# Patient Record
Sex: Female | Born: 1950
Health system: Southern US, Community
[De-identification: ages and names within clinical notes are randomized; demographics above are authoritative.]

## PROBLEM LIST (undated history)

## (undated) DIAGNOSIS — M12811 Other specific arthropathies, not elsewhere classified, right shoulder: Secondary | ICD-10-CM

## (undated) DIAGNOSIS — J449 Chronic obstructive pulmonary disease, unspecified: Secondary | ICD-10-CM

## (undated) DIAGNOSIS — M5134 Other intervertebral disc degeneration, thoracic region: Secondary | ICD-10-CM

## (undated) DIAGNOSIS — Z8489 Family history of other specified conditions: Secondary | ICD-10-CM

## (undated) DIAGNOSIS — K219 Gastro-esophageal reflux disease without esophagitis: Secondary | ICD-10-CM

## (undated) DIAGNOSIS — I10 Essential (primary) hypertension: Secondary | ICD-10-CM

## (undated) DIAGNOSIS — Z7901 Long term (current) use of anticoagulants: Secondary | ICD-10-CM

## (undated) DIAGNOSIS — M810 Age-related osteoporosis without current pathological fracture: Secondary | ICD-10-CM

## (undated) DIAGNOSIS — I5020 Unspecified systolic (congestive) heart failure: Secondary | ICD-10-CM

## (undated) DIAGNOSIS — J45909 Unspecified asthma, uncomplicated: Secondary | ICD-10-CM

## (undated) DIAGNOSIS — Z8614 Personal history of Methicillin resistant Staphylococcus aureus infection: Secondary | ICD-10-CM

## (undated) DIAGNOSIS — I779 Disorder of arteries and arterioles, unspecified: Secondary | ICD-10-CM

## (undated) DIAGNOSIS — I639 Cerebral infarction, unspecified: Secondary | ICD-10-CM

## (undated) DIAGNOSIS — Z9841 Cataract extraction status, right eye: Secondary | ICD-10-CM

## (undated) DIAGNOSIS — M1990 Unspecified osteoarthritis, unspecified site: Secondary | ICD-10-CM

## (undated) DIAGNOSIS — G4733 Obstructive sleep apnea (adult) (pediatric): Secondary | ICD-10-CM

## (undated) DIAGNOSIS — I2510 Atherosclerotic heart disease of native coronary artery without angina pectoris: Secondary | ICD-10-CM

## (undated) DIAGNOSIS — Z9981 Dependence on supplemental oxygen: Secondary | ICD-10-CM

## (undated) DIAGNOSIS — Z9289 Personal history of other medical treatment: Secondary | ICD-10-CM

## (undated) DIAGNOSIS — I272 Pulmonary hypertension, unspecified: Secondary | ICD-10-CM

## (undated) DIAGNOSIS — F419 Anxiety disorder, unspecified: Secondary | ICD-10-CM

## (undated) DIAGNOSIS — Z9842 Cataract extraction status, left eye: Secondary | ICD-10-CM

## (undated) DIAGNOSIS — M1612 Unilateral primary osteoarthritis, left hip: Secondary | ICD-10-CM

## (undated) DIAGNOSIS — G4730 Sleep apnea, unspecified: Secondary | ICD-10-CM

## (undated) DIAGNOSIS — Z7952 Long term (current) use of systemic steroids: Secondary | ICD-10-CM

## (undated) DIAGNOSIS — S32010A Wedge compression fracture of first lumbar vertebra, initial encounter for closed fracture: Secondary | ICD-10-CM

## (undated) DIAGNOSIS — I482 Chronic atrial fibrillation, unspecified: Secondary | ICD-10-CM

## (undated) DIAGNOSIS — I700 Atherosclerosis of aorta: Secondary | ICD-10-CM

## (undated) DIAGNOSIS — I428 Other cardiomyopathies: Secondary | ICD-10-CM

## (undated) DIAGNOSIS — I517 Cardiomegaly: Secondary | ICD-10-CM

## (undated) DIAGNOSIS — I519 Heart disease, unspecified: Secondary | ICD-10-CM

## (undated) DIAGNOSIS — I77810 Thoracic aortic ectasia: Secondary | ICD-10-CM

## (undated) DIAGNOSIS — M12812 Other specific arthropathies, not elsewhere classified, left shoulder: Secondary | ICD-10-CM

## (undated) DIAGNOSIS — E785 Hyperlipidemia, unspecified: Secondary | ICD-10-CM

## (undated) DIAGNOSIS — E669 Obesity, unspecified: Secondary | ICD-10-CM

## (undated) DIAGNOSIS — E119 Type 2 diabetes mellitus without complications: Secondary | ICD-10-CM

## (undated) HISTORY — DX: Unspecified systolic (congestive) heart failure: I50.20

## (undated) HISTORY — DX: Pulmonary hypertension, unspecified: I27.20

## (undated) HISTORY — DX: Other cardiomyopathies: I42.8

## (undated) HISTORY — DX: Chronic atrial fibrillation, unspecified: I48.20

## (undated) HISTORY — DX: Obesity, unspecified: E66.9

## (undated) HISTORY — DX: Obstructive sleep apnea (adult) (pediatric): G47.33

## (undated) HISTORY — DX: Hyperlipidemia, unspecified: E78.5

## (undated) HISTORY — DX: Atherosclerotic heart disease of native coronary artery without angina pectoris: I25.10

## (undated) HISTORY — DX: Gastro-esophageal reflux disease without esophagitis: K21.9

## (undated) HISTORY — PX: CORONARY ANGIOPLASTY: SHX604

## (undated) HISTORY — DX: Heart disease, unspecified: I51.9

## (undated) HISTORY — DX: Personal history of other medical treatment: Z92.89

## (undated) HISTORY — PX: CARDIAC CATHETERIZATION: SHX172

## (undated) HISTORY — PX: OTHER SURGICAL HISTORY: SHX169

## (undated) HISTORY — DX: Sleep apnea, unspecified: G47.30

---

## 2003-10-24 ENCOUNTER — Other Ambulatory Visit: Payer: Self-pay

## 2005-08-22 ENCOUNTER — Ambulatory Visit: Payer: Self-pay | Admitting: Internal Medicine

## 2005-08-27 ENCOUNTER — Ambulatory Visit: Payer: Self-pay | Admitting: Internal Medicine

## 2005-09-30 ENCOUNTER — Ambulatory Visit: Payer: Self-pay | Admitting: Obstetrics and Gynecology

## 2005-12-03 ENCOUNTER — Emergency Department: Payer: Self-pay | Admitting: Emergency Medicine

## 2006-04-28 ENCOUNTER — Ambulatory Visit: Payer: Self-pay | Admitting: Specialist

## 2006-06-10 ENCOUNTER — Ambulatory Visit: Payer: Self-pay | Admitting: Specialist

## 2006-10-15 ENCOUNTER — Ambulatory Visit: Payer: Self-pay | Admitting: Gerontology

## 2007-02-19 ENCOUNTER — Emergency Department: Payer: Self-pay | Admitting: Emergency Medicine

## 2007-05-04 ENCOUNTER — Ambulatory Visit: Payer: Self-pay | Admitting: General Surgery

## 2007-05-11 ENCOUNTER — Ambulatory Visit: Payer: Self-pay | Admitting: General Surgery

## 2007-10-19 ENCOUNTER — Ambulatory Visit: Payer: Self-pay | Admitting: Specialist

## 2008-07-26 ENCOUNTER — Ambulatory Visit: Payer: Self-pay | Admitting: Specialist

## 2008-11-02 ENCOUNTER — Ambulatory Visit: Payer: Self-pay

## 2011-04-26 ENCOUNTER — Other Ambulatory Visit: Payer: Self-pay | Admitting: Physician Assistant

## 2011-11-10 ENCOUNTER — Ambulatory Visit: Payer: Self-pay | Admitting: Internal Medicine

## 2012-02-27 ENCOUNTER — Ambulatory Visit: Payer: Self-pay | Admitting: Gastroenterology

## 2012-03-01 LAB — PATHOLOGY REPORT

## 2012-06-28 ENCOUNTER — Ambulatory Visit: Payer: Self-pay | Admitting: Specialist

## 2012-06-28 LAB — CREATININE, SERUM
Creatinine: 1 mg/dL (ref 0.60–1.30)
EGFR (Non-African Amer.): >60

## 2012-11-11 ENCOUNTER — Ambulatory Visit: Payer: Self-pay | Admitting: Internal Medicine

## 2013-09-13 ENCOUNTER — Emergency Department: Payer: Self-pay | Admitting: Emergency Medicine

## 2013-09-23 ENCOUNTER — Other Ambulatory Visit: Payer: Self-pay | Admitting: Podiatry

## 2013-11-28 LAB — COMPREHENSIVE METABOLIC PANEL
Albumin: 3.6 g/dL (ref 3.4–5.0)
BUN: 29 mg/dL — ABNORMAL HIGH (ref 7–18)
Chloride: 108 mmol/L — ABNORMAL HIGH (ref 98–107)
Co2: 27 mmol/L (ref 21–32)
Creatinine: 0.79 mg/dL (ref 0.60–1.30)
Glucose: 130 mg/dL — ABNORMAL HIGH (ref 65–99)
SGOT(AST): 24 U/L (ref 15–37)
Total Protein: 6.9 g/dL (ref 6.4–8.2)

## 2013-11-28 LAB — CBC
HCT: 39.2 % (ref 35.0–47.0)
MCH: 29.8 pg (ref 26.0–34.0)
MCHC: 33.2 g/dL (ref 32.0–36.0)
Platelet: 328 10*3/uL (ref 150–440)
RBC: 4.37 10*6/uL (ref 3.80–5.20)
RDW: 13.6 % (ref 11.5–14.5)
WBC: 9.5 10*3/uL (ref 3.6–11.0)

## 2013-11-28 LAB — TROPONIN I: Troponin-I: <0.02 ng/mL

## 2013-11-29 ENCOUNTER — Inpatient Hospital Stay: Payer: Self-pay | Admitting: Internal Medicine

## 2013-11-29 LAB — HEMOGLOBIN A1C: Hemoglobin A1C: 6 % (ref 4.2–6.3)

## 2013-12-02 LAB — PLATELET COUNT: Platelet: 353 10*3/uL (ref 150–440)

## 2014-02-01 ENCOUNTER — Ambulatory Visit: Payer: Self-pay | Admitting: Internal Medicine

## 2014-03-21 ENCOUNTER — Ambulatory Visit: Payer: Self-pay | Admitting: Physician Assistant

## 2014-05-04 ENCOUNTER — Ambulatory Visit: Payer: Self-pay | Admitting: Physician Assistant

## 2014-07-06 ENCOUNTER — Ambulatory Visit: Payer: Self-pay | Admitting: Internal Medicine

## 2014-08-06 ENCOUNTER — Emergency Department: Payer: Self-pay | Admitting: Emergency Medicine

## 2014-11-23 ENCOUNTER — Ambulatory Visit: Payer: Self-pay | Admitting: Internal Medicine

## 2015-04-13 NOTE — H&P (Signed)
PATIENT NAME:  Laura [name], Laura [name] MR#:  [id number] DATE OF BIRTH:  [birth year]  DATE OF ADMISSION:  11/29/2013  REFERRING PHYSICIAN: Dr. Owens Shark.   PRIMARY CARE PHYSICIAN: Dr. Lisette Grinder   PULMONOLOGIST: Dr. Raul Del.   CHIEF COMPLAINT: Shortness of breath, cough.   HISTORY OF PRESENT ILLNESS: This is a 64 year old Caucasian female with a past medical history of asthma, as well as deep vein thrombosis diagnosed back in the late 90s, who is presenting with shortness of breath and cough. She has had symptoms present for almost one year in total, which is why she follows with her pulmonologist, Dr. Raul Del. She has been on multiple doses of steroids and azithromycin, but states that whenever she is off steroids, her symptoms return. She has been having progressively worsening symptoms for approximately one week duration. She was recently started on steroids by her pulmonologist as an outpatient. Despite this, she is still having worsening symptoms. In the last one day, her shortness of breath is worse with movement, but present at rest as well, associated cough productive of gray-brown sputum. She denies any chest pain, palpitations, edema, orthopnea, fevers, chills, or recent sick contacts. She does, however, complain of having reflux sensation which is severe. 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   REVIEW OF SYSTEMS:   CONSTITUTIONAL: Denies fever or pain. Positive for generalized weakness and fatigue.  EYES: Denies blurred vision, double vision, eye pain.  EARS, NOSE, THROAT: Denies tinnitus, ear pain, hearing loss, postnasal drip.  RESPIRATORY: Positive for cough, wheeze, shortness of breath, as described above.  CARDIOVASCULAR: Denies chest pain, palpitations, edema.  GASTROINTESTINAL: Denies nausea, vomiting, diarrhea, abdominal pain. Positive for reflux disease.  GENITOURINARY: Denies dysuria, hematuria.  ENDOCRINE: Denies nocturia or thyroid problems.   HEMATOLOGIC AND LYMPHATIC: Denies easy bruising or bleeding.  SKIN: Denies rashes or lesions.  MUSCULOSKELETAL: Denies pain in neck, back, shoulder, knees, hips, arthritic symptoms.  NEUROLOGIC: Denies paralysis, paresthesias.  PSYCHIATRIC: Denies anxiety or depressive symptoms.  Otherwise, full review of systems system was performed by me is negative.   PAST MEDICAL HISTORY: Asthma, as well as deep vein thrombosis diagnosed back in the late 1990s, currently on anticoagulation.   SOCIAL HISTORY: Denies any alcohol, tobacco or drug usage. She works in the [employer] and is exposed to dust and chemicals through this.   FAMILY HISTORY: Positive for chronic obstructive pulmonary disease in her father as well as coronary artery disease in her father.   ALLERGIES: NO KNOWN DRUG ALLERGIES.   HOME MEDICATIONS: Include Advair Diskus 250/50 mcg 1 puff b.i.d., albuterol 2.5 mg per 3 mL inhalation q.6h. as needed for shortness of breath, Singulair 10 mg once daily in the evening.   PHYSICAL EXAMINATION: VITAL SIGNS: Temperature 97.7, heart rate 84, respirations 20, blood pressure 129/61, saturating 99% on supplemental O2. Weight 126.1 kg, BMI 47.8.  GENERAL: Obese Caucasian female in minimal distress secondary to respiratory status.  HEAD: Normocephalic, atraumatic.  EYES: Pupils equal, round, reactive to light. Extraocular muscles intact. No scleral icterus.  MOUTH: Moist mucosal membranes. Dentition intact. No abscess noted.  EARS, NOSE, AND THROAT: Throat clear, without exudates. No external lesions.  NECK: Supple. No thyromegaly. No nodules. No JVD.  PULMONARY: Greatly diminished breath sounds with scant expiratory wheezing throughout all lung fields. No use of accessory muscles. Good respiratory effort though tachypneic.  CHEST: Nontender to palpation.  CARDIOVASCULAR: S1, S1, S2, regular rate and rhythm. No murmurs, rubs, or gallops. No  edema. Pedal pulses 2+ bilaterally.   GASTROINTESTINAL: Soft, nontender, nondistended. No masses. Obese. Positive bowel sounds. No hepatosplenomegaly.  MUSCULOSKELETAL: No swelling, clubbing or edema. Range of motion full in all extremities.  NEUROLOGIC: Cranial nerves II through XII intact. No gross neurological deficits. Sensation intact. Reflexes intact.  SKIN: No ulcerations, lesions, rashes, or cyanosis. Skin warm, dry. Turgor is intact.  PSYCHIATRIC: Mood and affect within normal limits. The patient is awake, alert, and oriented x3. Insight and judgment intact.   LABORATORY DATA: Sodium 140, potassium 4.1, chloride 108, bicarbonate 27, BUN 29, creatinine 0.79, glucose 130, BNP 207. LFTs within normal limits. WBC 9.5, hemoglobin 13, platelets 328. Chest x-ray revealing shallow lung fields. EKG performed. Normal sinus rhythm.   ASSESSMENT AND PLAN: A 64 year old Caucasian female with history of asthma, as well as a deep vein thrombosis, presenting with shortness of breath and cough. She has been followed by Dr. Raul Del of pulmonology for approximately a year with duration of symptoms, off and on steroids and azithromycin.  1.  Acute respiratory failure with hypoxia. Became hypoxic in the Emergency Department, then given three DuoNeb treatments thus far, still not greatly improved. Will admit, provide supplemental O2 to keep oxygen saturation greater than 92%, provide DuoNeb therapy q.4 hours. We will consult pulmonology. We will give azithromycin as well as Solu-Medrol 60 mg IV. There was questionable etiology of this, whether this is related to a possible pneumonitis, given severe acid reflux versus textile pneumoconiosis. Given her social history as an outpatient, she will need pulmonary function tests if not already done, as well as will likely benefit from high-resolution CT. 2.  Gastroesophageal reflux disease. Will start PPI.  3.  Hyperglycemia, likely steroid induced. Add insulin sliding scale and q.6 hour Accu-Cheks. Will check  hemoglobin A1c.  4.  Deep vein thrombosis prophylaxis with heparin subcutaneous.   CODE STATUS: The patient is full code.   TIME SPENT: 45 minutes.    ____________________________ Aaron Mose. Foster Frericks, MD dkh:cg D: 11/29/2013 01:17:54 ET T: 11/29/2013 01:44:07 ET JOB#: [id number]  cc: Aaron Mose. Thomasena Vandenheuvel, MD, <Dictator> Zori Benbrook Woodfin Ganja MD ELECTRONICALLY SIGNED 11/30/2013 0:31

## 2015-04-14 NOTE — Discharge Summary (Signed)
PATIENT NAME:  Laura [name], Laura [name] MR#:  [id number] DATE OF BIRTH:  07-02-1951  DATE OF ADMISSION:  11/29/2013 DATE OF DISCHARGE:  12/02/2013  HISTORY OF PRESENT ILLNESS:  Laura [name] is a 64 year old white Laura [name] with a history of asthma and COPD who had been followed by Dr. Raul Del in pulmonary. She had had multiple flares of COPD as an outpatient and presented with a 1-week history of increasing shortness of breath, cough and sputum production. She was seen by the hospitalist service and admitted for further evaluation.   PAST MEDICAL HISTORY:  Was not given in the H and P by the admitting physician.   ALLERGIES:  The patient was noted to have no known drug allergies.   MEDICATIONS ON ADMISSION:  Included Advair 250/50 inhaler 1 puff b.i.d., albuterol 1.25 mg SVN q.6 hours as needed and Singulair 10 mg daily.   ADMISSION PHYSICAL EXAMINATION: As described by the admitting physician revealed a temperature of 97.7, pulse 84, respirations 20 and blood pressure 129/61. O2 sat was 99% on supplemental oxygen. Admission examination as described by the admitting physician was most notable for diminished breath sounds with expiratory wheezing throughout all lung fields.  She was noted to be tachypneic.   LABORATORY, DIAGNOSTIC AND RADIOLOGICAL STUDIES:  Admission chest x-ray showed shallow lung fields but no acute infiltrates. EKG shows a sinus rhythm but was otherwise unremarkable. CBC and basic metabolic panel were also unremarkable.   HOSPITAL COURSE: The patient was admitted to the regular medical floor where she was started on an intensive pulmonary toilet of IV steroids, IV antibiotics and SVNs. She was seen in consultation by her gastroenterologist. She was started on PPIs for symptoms of reflux. The patient did show slow but gradual improvement. She was noted at the time of discharge to have a normal pulse ox on room air. She was given a reflux diet handout prior to discharge.   DISCHARGE  DIAGNOSES: 1.  Acute respiratory failure secondary to chronic obstructive pulmonary disease flare.  2.  Chronic obstructive pulmonary disease.  3.  Obstructive sleep apnea.   DISCHARGE MEDICATIONS: 1.  Advair 250/50 inhaler 1 puff b.i.d.  2.  Albuterol 2.5 mg SVN every 6 hours as needed.  3.  Singulair 10 mg once a day.  4.  Prednisone 40 mg daily.  5.  Theophylline 200 mg extended-release b.i.d.  6.  Flonase 2 puffs each nostril daily.  7.  Protonix 40 mg daily.  8.  Hycodan 5 mL every 6 hours as needed for cough.   DISCHARGE DISPOSITION: The patient was discharged on a regular diet with activity as tolerated.   FOLLOWUP: She is to see Dr. Raul Del in approximately a week for followup and to have her steroids tapered.   ____________________________ Hewitt Blade Sarina Ser, MD jbw:cs D: 12/25/2013 11:10:15 ET T: 12/25/2013 14:08:14 ET JOB#: [id number]  cc: Jenny Reichmann B. Sarina Ser, MD, <Dictator> Lottie Mussel III MD ELECTRONICALLY SIGNED 12/26/2013 7:25

## 2015-05-31 ENCOUNTER — Other Ambulatory Visit: Payer: Self-pay | Admitting: Internal Medicine

## 2015-05-31 ENCOUNTER — Ambulatory Visit
Admission: RE | Admit: 2015-05-31 | Discharge: 2015-05-31 | Disposition: A | Discharge status: Home / Self Care | Payer: Managed Care, Other (non HMO) | Source: Ambulatory Visit | Attending: Internal Medicine | Admitting: Internal Medicine

## 2015-05-31 DIAGNOSIS — J040 Acute laryngitis: Secondary | ICD-10-CM | POA: Diagnosis not present

## 2015-05-31 DIAGNOSIS — R05 Cough: Secondary | ICD-10-CM | POA: Insufficient documentation

## 2015-05-31 DIAGNOSIS — R059 Cough, unspecified: Secondary | ICD-10-CM

## 2015-05-31 DIAGNOSIS — J029 Acute pharyngitis, unspecified: Secondary | ICD-10-CM | POA: Diagnosis not present

## 2015-08-16 ENCOUNTER — Ambulatory Visit
Admission: RE | Admit: 2015-08-16 | Discharge: 2015-08-16 | Disposition: A | Discharge status: Home / Self Care | Payer: Managed Care, Other (non HMO) | Source: Ambulatory Visit | Attending: Internal Medicine | Admitting: Internal Medicine

## 2015-08-16 ENCOUNTER — Other Ambulatory Visit: Payer: Self-pay | Admitting: Internal Medicine

## 2015-08-16 DIAGNOSIS — R05 Cough: Secondary | ICD-10-CM

## 2015-08-16 DIAGNOSIS — R059 Cough, unspecified: Secondary | ICD-10-CM

## 2015-08-23 ENCOUNTER — Other Ambulatory Visit: Payer: Self-pay | Admitting: Internal Medicine

## 2015-08-23 DIAGNOSIS — J986 Disorders of diaphragm: Secondary | ICD-10-CM

## 2015-09-05 ENCOUNTER — Ambulatory Visit: Payer: Managed Care, Other (non HMO)

## 2015-12-06 ENCOUNTER — Other Ambulatory Visit: Payer: Self-pay | Admitting: Physician Assistant

## 2015-12-06 DIAGNOSIS — R062 Wheezing: Secondary | ICD-10-CM

## 2015-12-06 DIAGNOSIS — R0602 Shortness of breath: Secondary | ICD-10-CM

## 2015-12-07 ENCOUNTER — Ambulatory Visit
Admission: RE | Admit: 2015-12-07 | Discharge: 2015-12-07 | Disposition: A | Discharge status: Home / Self Care | Payer: Managed Care, Other (non HMO) | Source: Ambulatory Visit | Attending: Physician Assistant | Admitting: Physician Assistant

## 2015-12-07 DIAGNOSIS — J449 Chronic obstructive pulmonary disease, unspecified: Secondary | ICD-10-CM | POA: Insufficient documentation

## 2015-12-07 DIAGNOSIS — R062 Wheezing: Secondary | ICD-10-CM | POA: Insufficient documentation

## 2015-12-07 DIAGNOSIS — J45909 Unspecified asthma, uncomplicated: Secondary | ICD-10-CM | POA: Insufficient documentation

## 2015-12-07 DIAGNOSIS — R0602 Shortness of breath: Secondary | ICD-10-CM

## 2016-03-26 DIAGNOSIS — G4733 Obstructive sleep apnea (adult) (pediatric): Secondary | ICD-10-CM | POA: Diagnosis not present

## 2016-05-13 DIAGNOSIS — K52831 Collagenous colitis: Secondary | ICD-10-CM | POA: Diagnosis not present

## 2016-05-13 DIAGNOSIS — K219 Gastro-esophageal reflux disease without esophagitis: Secondary | ICD-10-CM | POA: Diagnosis not present

## 2016-05-13 DIAGNOSIS — R197 Diarrhea, unspecified: Secondary | ICD-10-CM | POA: Diagnosis not present

## 2016-05-14 ENCOUNTER — Other Ambulatory Visit
Admission: RE | Admit: 2016-05-14 | Discharge: 2016-05-14 | Disposition: A | Discharge status: Home / Self Care | Payer: 59 | Source: Ambulatory Visit | Attending: Gastroenterology | Admitting: Gastroenterology

## 2016-05-14 DIAGNOSIS — K52831 Collagenous colitis: Secondary | ICD-10-CM | POA: Insufficient documentation

## 2016-05-14 DIAGNOSIS — R197 Diarrhea, unspecified: Secondary | ICD-10-CM | POA: Insufficient documentation

## 2016-05-14 LAB — C DIFFICILE QUICK SCREEN W PCR REFLEX
C DIFFICLE (CDIFF) ANTIGEN: NEGATIVE
C Diff interpretation: NEGATIVE
C Diff toxin: NEGATIVE

## 2016-05-14 LAB — GASTROINTESTINAL PANEL BY PCR, STOOL (REPLACES STOOL CULTURE)
ADENOVIRUS F40/41: NOT DETECTED
Astrovirus: NOT DETECTED
CRYPTOSPORIDIUM: NOT DETECTED
CYCLOSPORA CAYETANENSIS: NOT DETECTED
Campylobacter species: NOT DETECTED
E. coli O157: NOT DETECTED
ENTEROPATHOGENIC E COLI (EPEC): NOT DETECTED
Entamoeba histolytica: NOT DETECTED
Enteroaggregative E coli (EAEC): NOT DETECTED
Enterotoxigenic E coli (ETEC): NOT DETECTED
Giardia lamblia: NOT DETECTED
Norovirus GI/GII: NOT DETECTED
PLESIMONAS SHIGELLOIDES: NOT DETECTED
Rotavirus A: NOT DETECTED
SALMONELLA SPECIES: NOT DETECTED
SAPOVIRUS (I, II, IV, AND V): NOT DETECTED
Shiga like toxin producing E coli (STEC): NOT DETECTED
Shigella/Enteroinvasive E coli (EIEC): NOT DETECTED
VIBRIO SPECIES: NOT DETECTED
Vibrio cholerae: NOT DETECTED
YERSINIA ENTEROCOLITICA: NOT DETECTED

## 2016-06-05 DIAGNOSIS — J3089 Other allergic rhinitis: Secondary | ICD-10-CM | POA: Diagnosis not present

## 2016-06-05 DIAGNOSIS — J301 Allergic rhinitis due to pollen: Secondary | ICD-10-CM | POA: Diagnosis not present

## 2016-06-27 ENCOUNTER — Emergency Department
Admission: EM | Admit: 2016-06-27 | Discharge: 2016-06-27 | Disposition: A | Discharge status: Home / Self Care | Payer: 59 | Source: Home / Self Care | Attending: Emergency Medicine | Admitting: Emergency Medicine

## 2016-06-27 ENCOUNTER — Encounter: Payer: Self-pay | Admitting: Emergency Medicine

## 2016-06-27 DIAGNOSIS — L03111 Cellulitis of right axilla: Secondary | ICD-10-CM | POA: Diagnosis not present

## 2016-06-27 DIAGNOSIS — L0231 Cutaneous abscess of buttock: Secondary | ICD-10-CM | POA: Diagnosis not present

## 2016-06-27 DIAGNOSIS — L02411 Cutaneous abscess of right axilla: Secondary | ICD-10-CM

## 2016-06-27 DIAGNOSIS — I10 Essential (primary) hypertension: Secondary | ICD-10-CM

## 2016-06-27 DIAGNOSIS — J45909 Unspecified asthma, uncomplicated: Secondary | ICD-10-CM

## 2016-06-27 DIAGNOSIS — L03317 Cellulitis of buttock: Secondary | ICD-10-CM | POA: Diagnosis not present

## 2016-06-27 DIAGNOSIS — J449 Chronic obstructive pulmonary disease, unspecified: Secondary | ICD-10-CM | POA: Insufficient documentation

## 2016-06-27 DIAGNOSIS — E1165 Type 2 diabetes mellitus with hyperglycemia: Secondary | ICD-10-CM | POA: Diagnosis not present

## 2016-06-27 DIAGNOSIS — Z79899 Other long term (current) drug therapy: Secondary | ICD-10-CM

## 2016-06-27 DIAGNOSIS — A419 Sepsis, unspecified organism: Secondary | ICD-10-CM | POA: Diagnosis not present

## 2016-06-27 LAB — BASIC METABOLIC PANEL
ANION GAP: 11 (ref 5–15)
BUN: 21 mg/dL — AB (ref 6–20)
CHLORIDE: 97 mmol/L — AB (ref 101–111)
CO2: 28 mmol/L (ref 22–32)
Calcium: 9 mg/dL (ref 8.9–10.3)
Creatinine, Ser: 0.87 mg/dL (ref 0.44–1.00)
GFR calc Af Amer: >60 mL/min (ref >60)
Glucose, Bld: 99 mg/dL (ref 65–99)
POTASSIUM: 3.2 mmol/L — AB (ref 3.5–5.1)
SODIUM: 136 mmol/L (ref 135–145)

## 2016-06-27 LAB — CBC WITH DIFFERENTIAL/PLATELET
BASOS ABS: 0.1 10*3/uL (ref 0–0.1)
Basophils Relative: 1 %
Eosinophils Absolute: 0.4 10*3/uL (ref 0–0.7)
Eosinophils Relative: 3 %
HCT: 42.2 % (ref 35.0–47.0)
Hemoglobin: 14.4 g/dL (ref 12.0–16.0)
Lymphocytes Relative: 12 %
Lymphs Abs: 2.1 10*3/uL (ref 1.0–3.6)
MCH: 30.5 pg (ref 26.0–34.0)
MCHC: 34.1 g/dL (ref 32.0–36.0)
MCV: 89.4 fL (ref 80.0–100.0)
Monocytes Absolute: 1.4 10*3/uL — ABNORMAL HIGH (ref 0.2–0.9)
Monocytes Relative: 8 %
NEUTROS ABS: 13.6 10*3/uL — AB (ref 1.4–6.5)
NEUTROS PCT: 76 %
PLATELETS: 236 10*3/uL (ref 150–440)
RBC: 4.71 MIL/uL (ref 3.80–5.20)
RDW: 15.2 % — ABNORMAL HIGH (ref 11.5–14.5)
WBC: 17.6 10*3/uL — AB (ref 3.6–11.0)

## 2016-06-27 MED ORDER — CEFTRIAXONE SODIUM 1 G IJ SOLR
1.0000 g | Freq: Once | INTRAMUSCULAR | Status: DC
  Filled 2016-06-27: qty 10

## 2016-06-27 MED ORDER — ONDANSETRON 4 MG PO TBDP
4.0000 mg | ORAL_TABLET | Freq: Once | ORAL | Status: AC
  Administered 2016-06-27: 4 mg via ORAL
  Filled 2016-06-27: qty 1

## 2016-06-27 MED ORDER — SODIUM CHLORIDE 0.9 % IV BOLUS (SEPSIS)
1000.0000 mL | Freq: Once | INTRAVENOUS | Status: AC
  Administered 2016-06-27: 1000 mL via INTRAVENOUS

## 2016-06-27 MED ORDER — SULFAMETHOXAZOLE-TRIMETHOPRIM 800-160 MG PO TABS: 1.0000 | ORAL_TABLET | Freq: Two times a day (BID) | ORAL | Status: DC

## 2016-06-27 MED ORDER — HYDROMORPHONE HCL 1 MG/ML IJ SOLN
0.5000 mg | Freq: Once | INTRAMUSCULAR | Status: AC
  Administered 2016-06-27: 0.5 mg via INTRAVENOUS
  Filled 2016-06-27: qty 1

## 2016-06-27 MED ORDER — LIDOCAINE HCL (PF) 1 % IJ SOLN
INTRAMUSCULAR | Status: AC
  Filled 2016-06-27: qty 5

## 2016-06-27 MED ORDER — DEXTROSE 5 % IV SOLN
1.0000 g | Freq: Once | INTRAVENOUS | Status: AC
  Administered 2016-06-27: 1 g via INTRAVENOUS
  Filled 2016-06-27: qty 10

## 2016-06-27 MED ORDER — OXYCODONE-ACETAMINOPHEN 5-325 MG PO TABS: 1.0000 | ORAL_TABLET | ORAL | Status: DC | PRN

## 2016-06-27 NOTE — ED Provider Notes (Signed)
[facility] Emergency Department Provider Note  ____________________________________________  Time seen: Approximately 4:17 PM  I have reviewed the triage vital signs and the nursing notes.   HISTORY  Chief Complaint Abscess    HPI Laura [name] is a 65 y.o. female who presents for evaluation of abscess to her right axilla. Patient states severe pain 10 over 10 and reports going to the water park 5 days ago and subsequently given an infection in her arm. Patient states that she feels like she is dehydrated and feels weak. Originally had problems breathing secondary to her history of COPD used to nebulizer, feels a little bit better. Patient feels like the wire in her brawl poked out and struck her in the armpit and that's when the infection started. Denies any fever positive for chills but feels like a secondary to being in sun.   History reviewed. No pertinent past medical history.  There are no active problems to display for this patient.   History reviewed. No pertinent past surgical history.  Current Outpatient Rx  Name  Route  Sig  Dispense  Refill  . oxyCODONE-acetaminophen (ROXICET) 5-325 MG tablet   Oral   Take 1-2 tablets by mouth every 4 (four) hours as needed for severe pain.   15 tablet   0   . sulfamethoxazole-trimethoprim (BACTRIM DS,SEPTRA DS) 800-160 MG tablet   Oral   Take 1 tablet by mouth 2 (two) times daily.   20 tablet   0     Allergies Review of patient's allergies indicates no known allergies.  No family history on file.  Social History Social History  Substance Use Topics  . Smoking status: Never Smoker   . Smokeless tobacco: None  . Alcohol Use: None    Review of Systems Constitutional: No fever/Positive chills Eyes: No visual changes. ENT: No sore throat. Cardiovascular: Denies chest pain. Respiratory: Denies shortness of breath. Gastrointestinal: No abdominal pain.  No nausea, no vomiting.  No diarrhea.   No constipation. Genitourinary: Negative for dysuria. Musculoskeletal: Negative for back pain. Skin: Negative for rash. Neurological: Negative for headaches, focal weakness or numbness.  10-point ROS otherwise negative.  ____________________________________________   PHYSICAL EXAM:  VITAL SIGNS: ED Triage Vitals  Enc Vitals Group     BP 06/27/16 1558 122/67 mmHg     Pulse Rate 06/27/16 1558 96     Resp 06/27/16 1558 20     Temp 06/27/16 1558 98.5 F (36.9 C)     Temp Source 06/27/16 1558 Oral     SpO2 06/27/16 1558 99 %     Weight 06/27/16 1558 295 lb (133.811 kg)     Height 06/27/16 1558 5' 4.5" (1.638 m)     Head Cir --      Peak Flow --      Pain Score 06/27/16 1557 9     Pain Loc --      Pain Edu? --      Excl. in [place]? --     Constitutional: Alert and oriented. Well appearing and in no acute distress. Eyes: Conjunctivae are normal. PERRL. EOMI. Head: Atraumatic. Nose: No congestion/rhinnorhea. Mouth/Throat: Mucous membranes are moist.  Oropharynx non-erythematous. Neck: No stridor.   Cardiovascular: Normal rate, regular rhythm. Grossly normal heart sounds.  Good peripheral circulation. Respiratory: Normal respiratory effort.  No retractions. Lungs CTAB. Musculoskeletal: No lower extremity tenderness nor edema.  No joint effusions. Neurologic:  Normal speech and language. No gross focal neurologic deficits are appreciated. No gait instability.  Skin:  Right axilla with an 1 cm abscess noted nondraining. Nonfluctuant. An area 14 x 14 cm of erythema and warmth and tenderness noted within the axle itself. Psychiatric: Mood and affect are normal. Speech and behavior are normal.  ____________________________________________   LABS (all labs ordered are listed, but only abnormal results are displayed)  Labs Reviewed  BASIC METABOLIC PANEL - Abnormal; Notable for the following:    Potassium 3.2 (*)    Chloride 97 (*)    BUN 21 (*)    All other components within  normal limits  CBC WITH DIFFERENTIAL/PLATELET - Abnormal; Notable for the following:    WBC 17.6 (*)    RDW 15.2 (*)    Neutro Abs 13.6 (*)    Monocytes Absolute 1.4 (*)    All other components within normal limits   ____________________________________________  EKG   ____________________________________________  RADIOLOGY   ____________________________________________   PROCEDURES  Procedure(s) performed: None  Critical Care performed: No  ____________________________________________   INITIAL IMPRESSION / ASSESSMENT AND PLAN / ED COURSE  Pertinent labs & imaging results that were available during my care of the patient were reviewed by me and considered in my medical decision making (see chart for details).  Cutaneous abscess to the right axilla with cellulitis. Patient was given IV Rocephin 1 g as well as Dilaudid 1 mg and Zofran 4 mg. Patient desires not to have it lanced at this time Patient discharged home with Bactrim DS twice a day and instructed to return to the ER for wound check in 24 hours. Encouraged to use to moist heat. Area was marked with a marker to determine the edge of the cellulitis. She voices understanding and offers no other emergency medical complaints at this time. ____________________________________________   FINAL CLINICAL IMPRESSION(S) / ED DIAGNOSES  Final diagnoses:  Abscess of axilla, right     This chart was dictated using voice recognition software/Dragon. Despite best efforts to proofread, errors can occur which can change the meaning. Any change was purely unintentional.   Arlyss Repress, PA-C 06/27/16 [place], MD 06/27/16 2215

## 2016-06-27 NOTE — Discharge Instructions (Signed)

## 2016-06-27 NOTE — ED Notes (Signed)
Reports abscess to right axilla

## 2016-06-27 NOTE — ED Notes (Signed)
PT reports she was at a waterpark and when she got back she developed an abscess under her arm, also reports another one beginning in her "pantyline'

## 2016-06-28 ENCOUNTER — Inpatient Hospital Stay
Admission: EM | Admit: 2016-06-28 | Discharge: 2016-07-01 | DRG: 872 | Disposition: A | Discharge status: Home / Self Care | Payer: 59 | Attending: Internal Medicine | Admitting: Internal Medicine

## 2016-06-28 ENCOUNTER — Encounter: Payer: Self-pay | Admitting: Emergency Medicine

## 2016-06-28 DIAGNOSIS — E876 Hypokalemia: Secondary | ICD-10-CM | POA: Diagnosis present

## 2016-06-28 DIAGNOSIS — E1165 Type 2 diabetes mellitus with hyperglycemia: Secondary | ICD-10-CM | POA: Diagnosis present

## 2016-06-28 DIAGNOSIS — R0902 Hypoxemia: Secondary | ICD-10-CM | POA: Diagnosis present

## 2016-06-28 DIAGNOSIS — I10 Essential (primary) hypertension: Secondary | ICD-10-CM | POA: Diagnosis not present

## 2016-06-28 DIAGNOSIS — L0291 Cutaneous abscess, unspecified: Secondary | ICD-10-CM

## 2016-06-28 DIAGNOSIS — N289 Disorder of kidney and ureter, unspecified: Secondary | ICD-10-CM | POA: Diagnosis present

## 2016-06-28 DIAGNOSIS — L03111 Cellulitis of right axilla: Secondary | ICD-10-CM | POA: Diagnosis not present

## 2016-06-28 DIAGNOSIS — L02411 Cutaneous abscess of right axilla: Secondary | ICD-10-CM | POA: Diagnosis present

## 2016-06-28 DIAGNOSIS — L03317 Cellulitis of buttock: Secondary | ICD-10-CM | POA: Diagnosis present

## 2016-06-28 DIAGNOSIS — E860 Dehydration: Secondary | ICD-10-CM | POA: Diagnosis present

## 2016-06-28 DIAGNOSIS — M75121 Complete rotator cuff tear or rupture of right shoulder, not specified as traumatic: Secondary | ICD-10-CM | POA: Diagnosis not present

## 2016-06-28 DIAGNOSIS — Z79899 Other long term (current) drug therapy: Secondary | ICD-10-CM | POA: Diagnosis not present

## 2016-06-28 DIAGNOSIS — L0231 Cutaneous abscess of buttock: Secondary | ICD-10-CM | POA: Diagnosis present

## 2016-06-28 DIAGNOSIS — J449 Chronic obstructive pulmonary disease, unspecified: Secondary | ICD-10-CM | POA: Diagnosis present

## 2016-06-28 DIAGNOSIS — D72829 Elevated white blood cell count, unspecified: Secondary | ICD-10-CM

## 2016-06-28 DIAGNOSIS — A419 Sepsis, unspecified organism: Secondary | ICD-10-CM | POA: Diagnosis not present

## 2016-06-28 DIAGNOSIS — R000 Tachycardia, unspecified: Secondary | ICD-10-CM | POA: Diagnosis present

## 2016-06-28 DIAGNOSIS — J45909 Unspecified asthma, uncomplicated: Secondary | ICD-10-CM | POA: Diagnosis not present

## 2016-06-28 DIAGNOSIS — R52 Pain, unspecified: Secondary | ICD-10-CM

## 2016-06-28 DIAGNOSIS — R0602 Shortness of breath: Secondary | ICD-10-CM | POA: Diagnosis not present

## 2016-06-28 DIAGNOSIS — L0390 Cellulitis, unspecified: Secondary | ICD-10-CM

## 2016-06-28 DIAGNOSIS — R739 Hyperglycemia, unspecified: Secondary | ICD-10-CM

## 2016-06-28 HISTORY — DX: Unspecified asthma, uncomplicated: J45.909

## 2016-06-28 HISTORY — DX: Essential (primary) hypertension: I10

## 2016-06-28 LAB — CBC
HCT: 42.8 % (ref 35.0–47.0)
HEMOGLOBIN: 14.6 g/dL (ref 12.0–16.0)
MCH: 30.1 pg (ref 26.0–34.0)
MCHC: 34.1 g/dL (ref 32.0–36.0)
MCV: 88.2 fL (ref 80.0–100.0)
PLATELETS: 239 10*3/uL (ref 150–440)
RBC: 4.86 MIL/uL (ref 3.80–5.20)
RDW: 14.8 % — ABNORMAL HIGH (ref 11.5–14.5)
WBC: 21.6 10*3/uL — AB (ref 3.6–11.0)

## 2016-06-28 LAB — COMPREHENSIVE METABOLIC PANEL
ALK PHOS: 76 U/L (ref 38–126)
ALT: 14 U/L (ref 14–54)
ANION GAP: 12 (ref 5–15)
AST: 14 U/L — AB (ref 15–41)
Albumin: 3.4 g/dL — ABNORMAL LOW (ref 3.5–5.0)
BILIRUBIN TOTAL: 1.2 mg/dL (ref 0.3–1.2)
BUN: 19 mg/dL (ref 6–20)
CALCIUM: 8.8 mg/dL — AB (ref 8.9–10.3)
CO2: 26 mmol/L (ref 22–32)
Chloride: 98 mmol/L — ABNORMAL LOW (ref 101–111)
Creatinine, Ser: 1.13 mg/dL — ABNORMAL HIGH (ref 0.44–1.00)
GFR, EST AFRICAN AMERICAN: 57 mL/min — AB (ref >60)
GFR, EST NON AFRICAN AMERICAN: 50 mL/min — AB (ref >60)
Glucose, Bld: 106 mg/dL — ABNORMAL HIGH (ref 65–99)
Potassium: 3.4 mmol/L — ABNORMAL LOW (ref 3.5–5.1)
Sodium: 136 mmol/L (ref 135–145)
TOTAL PROTEIN: 6.9 g/dL (ref 6.5–8.1)

## 2016-06-28 LAB — TSH: TSH: 2.446 u[IU]/mL (ref 0.350–4.500)

## 2016-06-28 LAB — MAGNESIUM: Magnesium: 2 mg/dL (ref 1.7–2.4)

## 2016-06-28 MED ORDER — HYDROMORPHONE HCL 1 MG/ML IJ SOLN
1.0000 mg | Freq: Once | INTRAMUSCULAR | Status: AC
  Administered 2016-06-28: 1 mg via INTRAVENOUS
  Filled 2016-06-28: qty 1

## 2016-06-28 MED ORDER — MORPHINE SULFATE (PF) 2 MG/ML IV SOLN
2.0000 mg | INTRAVENOUS | Status: DC | PRN
  Administered 2016-06-29: 2 mg via INTRAVENOUS
  Filled 2016-06-28: qty 1

## 2016-06-28 MED ORDER — ACETAMINOPHEN 325 MG PO TABS
650.0000 mg | ORAL_TABLET | Freq: Four times a day (QID) | ORAL | Status: DC | PRN
  Filled 2016-06-28: qty 2

## 2016-06-28 MED ORDER — PROMETHAZINE HCL 25 MG/ML IJ SOLN
12.5000 mg | Freq: Once | INTRAMUSCULAR | Status: AC
  Administered 2016-06-28: 12.5 mg via INTRAVENOUS
  Filled 2016-06-28: qty 1

## 2016-06-28 MED ORDER — POTASSIUM CHLORIDE IN NACL 20-0.9 MEQ/L-% IV SOLN
INTRAVENOUS | Status: DC
  Filled 2016-06-28 (×3): qty 1000

## 2016-06-28 MED ORDER — ONDANSETRON HCL 4 MG/2ML IJ SOLN
INTRAMUSCULAR | Status: AC
  Administered 2016-06-28: 4 mg via INTRAVENOUS
  Filled 2016-06-28: qty 2

## 2016-06-28 MED ORDER — PIPERACILLIN-TAZOBACTAM 3.375 G IVPB 30 MIN
3.3750 g | Freq: Once | INTRAVENOUS | Status: AC
  Administered 2016-06-28: 3.375 g via INTRAVENOUS

## 2016-06-28 MED ORDER — ENOXAPARIN SODIUM 40 MG/0.4ML ~~LOC~~ SOLN
40.0000 mg | SUBCUTANEOUS | Status: DC
  Administered 2016-06-28 – 2016-06-29 (×2): 40 mg via SUBCUTANEOUS
  Filled 2016-06-28 (×2): qty 0.4

## 2016-06-28 MED ORDER — SODIUM CHLORIDE 0.9 % IV BOLUS (SEPSIS)
2000.0000 mL | Freq: Once | INTRAVENOUS | Status: AC
  Administered 2016-06-28: 2000 mL via INTRAVENOUS

## 2016-06-28 MED ORDER — VANCOMYCIN HCL 10 G IV SOLR
1250.0000 mg | Freq: Two times a day (BID) | INTRAVENOUS | Status: DC
  Administered 2016-06-29 – 2016-07-01 (×4): 1250 mg via INTRAVENOUS
  Filled 2016-06-28 (×6): qty 1250

## 2016-06-28 MED ORDER — ONDANSETRON 4 MG PO TBDP
4.0000 mg | ORAL_TABLET | Freq: Once | ORAL | Status: AC
  Administered 2016-06-28: 4 mg via ORAL

## 2016-06-28 MED ORDER — VANCOMYCIN HCL 10 G IV SOLR
2000.0000 mg | Freq: Once | INTRAVENOUS | Status: AC
  Administered 2016-06-28: 2000 mg via INTRAVENOUS
  Filled 2016-06-28: qty 2000

## 2016-06-28 MED ORDER — ONDANSETRON HCL 4 MG PO TABS
4.0000 mg | ORAL_TABLET | Freq: Four times a day (QID) | ORAL | Status: DC | PRN
Start: 2016-06-28 — End: 2016-07-01

## 2016-06-28 MED ORDER — DOCUSATE SODIUM 100 MG PO CAPS
100.0000 mg | ORAL_CAPSULE | Freq: Two times a day (BID) | ORAL | Status: DC
  Administered 2016-06-28 – 2016-07-01 (×6): 100 mg via ORAL
  Filled 2016-06-28 (×6): qty 1

## 2016-06-28 MED ORDER — ACETAMINOPHEN 650 MG RE SUPP: 650.0000 mg | Freq: Four times a day (QID) | RECTAL | Status: DC | PRN

## 2016-06-28 MED ORDER — PIPERACILLIN-TAZOBACTAM 3.375 G IVPB
3.3750 g | Freq: Three times a day (TID) | INTRAVENOUS | Status: DC
  Administered 2016-06-29 – 2016-07-01 (×6): 3.375 g via INTRAVENOUS
  Filled 2016-06-28 (×9): qty 50

## 2016-06-28 MED ORDER — POTASSIUM CHLORIDE IN NACL 20-0.9 MEQ/L-% IV SOLN
INTRAVENOUS | Status: DC
  Administered 2016-06-28: 22:00:00 via INTRAVENOUS
  Filled 2016-06-28 (×2): qty 1000

## 2016-06-28 MED ORDER — SODIUM CHLORIDE 0.9% FLUSH
3.0000 mL | Freq: Two times a day (BID) | INTRAVENOUS | Status: DC
  Administered 2016-06-28 – 2016-07-01 (×5): 3 mL via INTRAVENOUS

## 2016-06-28 MED ORDER — ONDANSETRON 4 MG PO TBDP
ORAL_TABLET | ORAL | Status: AC
  Filled 2016-06-28: qty 1

## 2016-06-28 MED ORDER — PANTOPRAZOLE SODIUM 40 MG PO TBEC
40.0000 mg | DELAYED_RELEASE_TABLET | Freq: Every day | ORAL | Status: DC
  Administered 2016-06-28 – 2016-07-01 (×4): 40 mg via ORAL
  Filled 2016-06-28 (×4): qty 1

## 2016-06-28 MED ORDER — ONDANSETRON HCL 4 MG/2ML IJ SOLN
4.0000 mg | Freq: Four times a day (QID) | INTRAMUSCULAR | Status: DC | PRN
  Administered 2016-06-29: 4 mg via INTRAVENOUS
  Filled 2016-06-28: qty 2

## 2016-06-28 MED ORDER — ONDANSETRON HCL 4 MG/2ML IJ SOLN
4.0000 mg | Freq: Once | INTRAMUSCULAR | Status: AC
  Administered 2016-06-28: 4 mg via INTRAVENOUS

## 2016-06-28 MED ORDER — IPRATROPIUM-ALBUTEROL 0.5-2.5 (3) MG/3ML IN SOLN
3.0000 mL | RESPIRATORY_TRACT | Status: DC | PRN
  Filled 2016-06-28: qty 3

## 2016-06-28 MED ORDER — SENNA 8.6 MG PO TABS: 1.0000 | ORAL_TABLET | Freq: Every day | ORAL | Status: DC | PRN

## 2016-06-28 NOTE — ED Notes (Signed)
Patient to ER for c/o cellulitis to right axillary region. Patient was seen yesterday, states area appears worse than yesterday. Also reports having vomiting today. Started antibiotics yesterday.

## 2016-06-28 NOTE — H&P (Signed)
[facility] NAME: Laura [name]    MR#:  [id number]  DATE OF BIRTH:  [birth year]  DATE OF ADMISSION:  06/28/2016  PRIMARY CARE PHYSICIAN: Laura [name]., PA   REQUESTING/REFERRING PHYSICIAN:   CHIEF COMPLAINT:   Chief Complaint  Patient presents with  . Cellulitis    HISTORY OF PRESENT ILLNESS: Laura [name]  is a 65 y.o. female with a known history of COPD, hypertension, who presents to the hospital with complaints of right axillary and left buttock area pain, cellulitis. According to patient,  she was doing well up until approximately a week ago when she noticed stinging in the left buttock . She noted the right axilla area swelling and pain about a week ago as well, it got worse over the period of time. She was seen in the emergency room yesterday, given Septra DS, however, did not improve with progressively worsened. It is coming back to the hospital with significant pain in the right axilla as well as left buttock area, nausea and vomiting and feeling severely dehydrated, also of feeling chilly,  freezing. In emergency room, she was noted to have markedly elevated white blood cell count, pus coming out from the right axillary area abscess. She was complaining of significant pain, that she was given Dilaudid, after which she became hypoxic. She is very somnolent now and I'm not able to review systems  PAST MEDICAL HISTORY:  COPD, hypertension.  PAST SURGICAL HISTORY: History reviewed. No pertinent past surgical history.  SOCIAL HISTORY:  Social History  Substance Use Topics  . Smoking status: Never Smoker   . Smokeless tobacco: Not on file  . Alcohol Use: Not on file    FAMILY HISTORY: No family history on file.  DRUG ALLERGIES: No Known Allergies  Review of Systems  Unable to perform ROS: mental acuity    MEDICATIONS AT HOME:  Prior to Admission medications   Medication Sig Start Date End Date Taking?  Authorizing Provider  dexlansoprazole (DEXILANT) 60 MG capsule Take 60 mg by mouth 2 (two) times daily. 05/09/16  Yes Historical Provider, MD  hydrochlorothiazide (HYDRODIURIL) 25 MG tablet Take 25 mg by mouth daily. 06/22/16  Yes Historical Provider, MD  montelukast (SINGULAIR) 10 MG tablet Take 10 mg by mouth daily. 06/22/16  Yes Historical Provider, MD  phentermine (ADIPEX-P) 37.5 MG tablet Take 37.5 mg by mouth daily. 06/05/16  Yes Historical Provider, MD  THEO-24 100 MG 24 hr capsule Take 100 mg by mouth daily. 06/23/16  Yes Historical Provider, MD  oxyCODONE-acetaminophen (ROXICET) 5-325 MG tablet Take 1-2 tablets by mouth every 4 (four) hours as needed for severe pain. 06/27/16   Laura Crane Beers, PA-C  sulfamethoxazole-trimethoprim (BACTRIM DS,SEPTRA DS) 800-160 MG tablet Take 1 tablet by mouth 2 (two) times daily. 06/27/16   Laura Crane Beers, PA-C      PHYSICAL EXAMINATION:   VITAL SIGNS: Blood pressure 127/55, pulse 99, temperature 99.1 F (37.3 C), temperature source Oral, resp. rate 18, height 5' 4.5" (1.638 m), weight 133.811 kg (295 lb), SpO2 97 %.  GENERAL:  65 y.o.-year-old very obese patient lying in the bed in moderate distress, in significant pain in the right axillary area ulcer, left buttock area rendering her unable to move.  EYES: Pupils equal, round, reactive to light and accommodation. No scleral icterus. Extraocular muscles intact.  HEENT: Head atraumatic, normocephalic. Oropharynx and nasopharynx clear. Dry oral mucosa, chapped lips NECK:  Supple, no jugular venous distention. No  thyroid enlargement, no tenderness. Some right upper chest swelling close to the right axilla area, no fluctuations LUNGS: Some diminished breath sounds bilaterally, no wheezing, rales,rhonchi or crepitation. No use of accessory muscles of respiration. Poor effort due to significant somnolence CARDIOVASCULAR: S1, S2 normal. No murmurs, rubs, or gallops.  ABDOMEN: Soft, nontender, nondistended. Bowel sounds  present. No organomegaly or mass.  EXTREMITIES: No pedal edema, cyanosis, or clubbing. Mild erythema was noted in the right above ankle area pretibially, slightly increased warmth  NEUROLOGIC: Cranial nerves II through XII are intact. Muscle strength 5/5 in all extremities, Although very difficult to evaluate due to right axilla pain, left buttock area discomfort. Sensation intact. Gait not checked.  PSYCHIATRIC: The patient is  somnolent, however, able to open her eyes and talks whenever approached , oriented x 3.  SKIN: No obvious rash, lesion, or ulcer. Mild erythema noted in the right above ankle area pretibially , significant induration, erythema and pus exuding from right axilla abscess, left lower buttock cheek area induration, erythema, increased warmth, central dimpling, but no fluctuations, no pus, very tender to palpation in right axilla and left buttock area  LABORATORY PANEL:   CBC  Recent Labs Lab 06/27/16 1650 06/28/16 1435  WBC 17.6* 21.6*  HGB 14.4 14.6  HCT 42.2 42.8  PLT 236 239  MCV 89.4 88.2  MCH 30.5 30.1  MCHC 34.1 34.1  RDW 15.2* 14.8*  LYMPHSABS 2.1  --   MONOABS 1.4*  --   EOSABS 0.4  --   BASOSABS 0.1  --    ------------------------------------------------------------------------------------------------------------------  Chemistries   Recent Labs Lab 06/28/16 1435  NA 136  K 3.4*  CL 98*  CO2 26  GLUCOSE 106*  BUN 19  CREATININE 1.13*  CALCIUM 8.8*  AST 14*  ALT 14  ALKPHOS 76  BILITOT 1.2   ------------------------------------------------------------------------------------------------------------------  Cardiac Enzymes No results for input(s): TROPONINI in the last 168 hours. ------------------------------------------------------------------------------------------------------------------  RADIOLOGY: No results found.  EKG: Orders placed or performed in visit on 11/29/13  . EKG 12-Lead    IMPRESSION AND PLAN:  Principal  Problem:   Sepsis ([place]) Active Problems:   Cellulitis and abscess   Hypokalemia   Acute renal insufficiency   Leukocytosis   Hypoxia   Hyperglycemia #1. Sepsis due to cellulitis and abscess in the right axilla and left buttock, blood cultures are taken, initiate patient on broad-spectrum antibiotics, obtain wound cultures #2. Cellulitis and abscess, get surgery involved for further recommendations, obtain wound culture, continue broad-spectrum antibiotics, follow clinically #3. Hypokalemia, supplement intravenously, get magnesium level #4 acute renal insufficiency, continue IV fluids, get urinalysis, follow creatinine in the morning #5. Leukocytosis, follow with antibiotic therapy #6. Hypoxia with opiates, likely due to obesity hypoventilation/questionable obstructive sleep apnea, continue opiates orally and intravenously, opiate administration precautions, continue oxygen as needed, get chest x-ray #7. Hyperglycemia, get a hemoglobin A1c to rule out diabetes   All the records are reviewed and case discussed with ED provider. Management plans discussed with the patient, family and they are in agreement.  CODE STATUS: Code Status History    This patient does not have a recorded code status. Please follow your organizational policy for patients in this situation.       TOTAL TIME TAKING CARE OF THIS PATIENT: 60  minutes.    Theodoro Grist M.D on 06/28/2016 at 8:26 PM  Between 7am to 6pm - Pager - [phone] After 6pm go to [email or web address] - password Child psychotherapist Hospitalists  Office  [phone]  CC: Primary care physician; Laura [name]., PA

## 2016-06-28 NOTE — Progress Notes (Signed)
Pt placed on [facility] CPAP C-3.  CPAP is plugged into red outlet. Pt is tolerating well. 3L O2 in line.

## 2016-06-28 NOTE — ED Notes (Signed)
Patient took Percocet at home at approx 1300. Advised we will re-evaluate pain at appropriate time for pain protocol orders.

## 2016-06-28 NOTE — ED Notes (Signed)
Report called to floor, given to [facility].

## 2016-06-28 NOTE — Consult Note (Signed)
Pharmacy Antibiotic Note  Laura [name] is a 65 y.o. female admitted on 06/28/2016 with sepsis and cellulitis.  Pharmacy has been consulted for vancomycin and zosyn dosing.  Plan: Pt received 2g of vancomycin in the ED. Vancomycin 1250 IV every 12 hours.  Goal trough 15-20 mcg/mL. Zosyn 3.375g IV q8h (4 hour infusion).  Will draw trough at steady state 7/10 @ 2000. Pt is at risk for accumulation due to large body weight. Monitor closly  Height: 5' 4.5" (163.8 cm) Weight: 295 lb (133.811 kg) IBW/kg (Calculated) : 55.85  Temp (24hrs), Avg:99.1 F (37.3 C), Min:99.1 F (37.3 C), Max:99.1 F (37.3 C)   Recent Labs Lab 06/27/16 1650 06/28/16 1435  WBC 17.6* 21.6*  CREATININE 0.87 1.13*    Estimated Creatinine Clearance: 67.3 mL/min (by C-G formula based on Cr of 1.13).    No Known Allergies  Antimicrobials this admission: vancomycin 7/8 >>  zosyn 7/8 >>   Dose adjustments this admission:   Microbiology results: 7/8 wound cx:  Thank you for allowing pharmacy to be a part of this patient's care.  Ramond Dial 06/28/2016 8:58 PM

## 2016-06-28 NOTE — ED Provider Notes (Signed)
[facility] Emergency Department Provider Note  ____________________________________________  Time seen: 4:40 PM  I have reviewed the triage vital signs and the nursing notes.   HISTORY  Chief Complaint Cellulitis    HPI Laura [name] is a 65 y.o. female who complains of severe pain in the right axilla. She was seen here yesterday for swelling in the area, found to have cellulitis with a small area of purulent drainage. Started on Bactrim after receiving IV ceftriaxone, given Percocet. However she is having worsening pain and expanding erythema of the area despite taking 3 doses of Bactrim so far at home. Also having nausea and inability to tolerate oral intake.  Also reports that she has a developing area of redness and pain in the left buttock.     History reviewed. No pertinent past medical history.   There are no active problems to display for this patient.    History reviewed. No pertinent past surgical history.   Current Outpatient Rx  Name  Route  Sig  Dispense  Refill  . oxyCODONE-acetaminophen (ROXICET) 5-325 MG tablet   Oral   Take 1-2 tablets by mouth every 4 (four) hours as needed for severe pain.   15 tablet   0   . sulfamethoxazole-trimethoprim (BACTRIM DS,SEPTRA DS) 800-160 MG tablet   Oral   Take 1 tablet by mouth 2 (two) times daily.   20 tablet   0      Allergies Review of patient's allergies indicates no known allergies.   No family history on file.  Social History Social History  Substance Use Topics  . Smoking status: Never Smoker   . Smokeless tobacco: None  . Alcohol Use: None    Review of Systems  Constitutional:   No fever or chills.  ENT:   No sore throat. No rhinorrhea. Cardiovascular:   No chest pain. Respiratory:   No dyspnea or cough. Gastrointestinal:   Negative for abdominal pain, vomiting and diarrhea.  Genitourinary:   Negative for dysuria or difficulty urinating. Musculoskeletal:    Severe right axillary pain and left inferior gluteal pain Neurological:   Negative for headaches 10-point ROS otherwise negative.  ____________________________________________   PHYSICAL EXAM:  VITAL SIGNS: ED Triage Vitals  Enc Vitals Group     BP 06/28/16 1431 123/92 mmHg     Pulse Rate 06/28/16 1431 76     Resp 06/28/16 1431 20     Temp 06/28/16 1431 99.1 F (37.3 C)     Temp Source 06/28/16 1431 Oral     SpO2 06/28/16 1431 97 %     Weight 06/28/16 1431 295 lb (133.811 kg)     Height 06/28/16 1431 5' 4.5" (1.638 m)     Head Cir --      Peak Flow --      Pain Score 06/28/16 1432 10     Pain Loc --      Pain Edu? --      Excl. in [place]? --     Vital signs reviewed, nursing assessments reviewed.   Constitutional:   Alert and oriented. Very uncomfortable. Eyes:   No scleral icterus. No conjunctival pallor. PERRL. EOMI.  No nystagmus. ENT   Head:   Normocephalic and atraumatic.   Nose:   No congestion/rhinnorhea. No septal hematoma   Mouth/Throat:   Dry mucous membranes, no pharyngeal erythema. No peritonsillar mass.    Neck:   No stridor. No SubQ emphysema. No meningismus. Hematological/Lymphatic/Immunilogical:   No cervical lymphadenopathy. Cardiovascular:  RRR. Symmetric bilateral radial and DP pulses.  No murmurs.  Respiratory:   Normal respiratory effort without tachypnea nor retractions. Breath sounds are clear and equal bilaterally. No wheezes/rales/rhonchi. Gastrointestinal:   Soft and nontender. Non distended. There is no CVA tenderness.  No rebound, rigidity, or guarding. Genitourinary:   deferred Musculoskeletal:  Tenderness in the area of cellulitis in the right axilla. Otherwise extremities have full range of motion and nontender. No bony tenderness or deformities. Neurologic:   Normal speech and language.  CN 2-10 normal. Motor grossly intact. No gross focal neurologic deficits are appreciated.  Skin:    Large area approximately 12-15 cm of  induration and erythema in the right axilla extending from the proximal right arm to the right lateral chest. It has expanded compared to an area marked yesterday. There is a central 2-3 cm area with skin laceration and a scant amount of purulent drainage with expression. There is also approximate 4 cm diameter indurated painful tender area on the inferior left gluteus. This is not adjacent to the rectal area and not suggestive of perirectal abscess  ____________________________________________    LABS (pertinent positives/negatives) (all labs ordered are listed, but only abnormal results are displayed) Labs Reviewed  COMPREHENSIVE METABOLIC PANEL - Abnormal; Notable for the following:    Potassium 3.4 (*)    Chloride 98 (*)    Glucose, Bld 106 (*)    Creatinine, Ser 1.13 (*)    Calcium 8.8 (*)    Albumin 3.4 (*)    AST 14 (*)    GFR calc non Af Amer 50 (*)    GFR calc Af Amer 57 (*)    All other components within normal limits  CBC - Abnormal; Notable for the following:    WBC 21.6 (*)    RDW 14.8 (*)    All other components within normal limits   ____________________________________________   EKG    ____________________________________________    RADIOLOGY    ____________________________________________   PROCEDURES   ____________________________________________   INITIAL IMPRESSION / ASSESSMENT AND PLAN / ED COURSE  Pertinent labs & imaging results that were available during my care of the patient were reviewed by me and considered in my medical decision making (see chart for details).  Patient presents with worsening pain nausea and inability to tolerate oral intake in the setting of cellulitis of the right upper extremity at the axilla. It appears to be worsening compared to yesterday. Leukocytosis is 21,000 and patient is mildly tachycardic. Given 2 L IV saline bolus, multiple doses of antiemetics. After receiving Dilaudid for analgesia, patient reports that  her pain is still severe but she is also somnolent and oxygen saturation dropped into the low 80s on room air. She is placed on nasal cannula.  Condition discussed with patient and family, offered admission which they accept due to difficulty controlling her symptoms, apparently worsening cellulitis so far, and the delicate balance between pain control and respiratory status. I will discussed with the hospitalist permission.     ____________________________________________   FINAL CLINICAL IMPRESSION(S) / ED DIAGNOSES  Final diagnoses:  Cellulitis of right axilla  Cellulitis of buttock       Portions of this note were generated with dragon dictation software. Dictation errors may occur despite best attempts at proofreading.   Carrie Mew, MD 06/28/16 2021

## 2016-06-29 ENCOUNTER — Inpatient Hospital Stay: Payer: 59

## 2016-06-29 ENCOUNTER — Inpatient Hospital Stay
Admit: 2016-06-29 | Discharge: 2016-06-29 | Disposition: A | Discharge status: Home / Self Care | Payer: 59 | Attending: Internal Medicine | Admitting: Internal Medicine

## 2016-06-29 ENCOUNTER — Inpatient Hospital Stay: Payer: 59 | Admitting: Registered Nurse

## 2016-06-29 ENCOUNTER — Encounter
Admission: EM | Disposition: A | Discharge status: Home / Self Care | Payer: Self-pay | Source: Home / Self Care | Attending: Internal Medicine

## 2016-06-29 DIAGNOSIS — A419 Sepsis, unspecified organism: Principal | ICD-10-CM

## 2016-06-29 DIAGNOSIS — L0291 Cutaneous abscess, unspecified: Secondary | ICD-10-CM

## 2016-06-29 DIAGNOSIS — L03317 Cellulitis of buttock: Secondary | ICD-10-CM

## 2016-06-29 DIAGNOSIS — L03111 Cellulitis of right axilla: Secondary | ICD-10-CM | POA: Insufficient documentation

## 2016-06-29 HISTORY — PX: INCISION AND DRAINAGE ABSCESS: SHX5864

## 2016-06-29 HISTORY — PX: INCISION AND DRAINAGE OF WOUND: SHX1803

## 2016-06-29 LAB — URINALYSIS COMPLETE WITH MICROSCOPIC (ARMC ONLY)
BILIRUBIN URINE: NEGATIVE
GLUCOSE, UA: NEGATIVE mg/dL
LEUKOCYTES UA: NEGATIVE
NITRITE: NEGATIVE
PH: 5 (ref 5.0–8.0)
Protein, ur: NEGATIVE mg/dL
Specific Gravity, Urine: 1.014 (ref 1.005–1.030)

## 2016-06-29 LAB — BLOOD GAS, ARTERIAL
ACID-BASE EXCESS: 0 mmol/L (ref 0.0–3.0)
BICARBONATE: 23.6 meq/L (ref 21.0–28.0)
FIO2: 0.28
O2 Saturation: 96.5 %
PATIENT TEMPERATURE: 37
PO2 ART: 82 mmHg — AB (ref 83.0–108.0)
pCO2 arterial: 34 mmHg (ref 32.0–48.0)
pH, Arterial: 7.45 (ref 7.350–7.450)

## 2016-06-29 LAB — CBC
HEMATOCRIT: 36.5 % (ref 35.0–47.0)
HEMOGLOBIN: 12.6 g/dL (ref 12.0–16.0)
MCH: 30.7 pg (ref 26.0–34.0)
MCHC: 34.4 g/dL (ref 32.0–36.0)
MCV: 89.2 fL (ref 80.0–100.0)
Platelets: 216 10*3/uL (ref 150–440)
RBC: 4.09 MIL/uL (ref 3.80–5.20)
RDW: 15 % — ABNORMAL HIGH (ref 11.5–14.5)
WBC: 13.6 10*3/uL — AB (ref 3.6–11.0)

## 2016-06-29 LAB — BASIC METABOLIC PANEL
ANION GAP: 7 (ref 5–15)
BUN: 13 mg/dL (ref 6–20)
CHLORIDE: 105 mmol/L (ref 101–111)
CO2: 24 mmol/L (ref 22–32)
CREATININE: 0.81 mg/dL (ref 0.44–1.00)
Calcium: 7.8 mg/dL — ABNORMAL LOW (ref 8.9–10.3)
GFR calc non Af Amer: >60 mL/min (ref >60)
Glucose, Bld: 93 mg/dL (ref 65–99)
POTASSIUM: 3.2 mmol/L — AB (ref 3.5–5.1)
SODIUM: 136 mmol/L (ref 135–145)

## 2016-06-29 LAB — ECHOCARDIOGRAM COMPLETE
Height: 64.5 in
Weight: 4832 oz

## 2016-06-29 SURGERY — INCISION AND DRAINAGE, ABSCESS
Anesthesia: General | Laterality: Right

## 2016-06-29 MED ORDER — SODIUM CHLORIDE 0.9 % IV SOLN
INTRAVENOUS | Status: DC
  Administered 2016-06-29 – 2016-06-30 (×3): via INTRAVENOUS

## 2016-06-29 MED ORDER — HYDROMORPHONE HCL 1 MG/ML IJ SOLN
1.0000 mg | INTRAMUSCULAR | Status: DC | PRN
  Administered 2016-06-29: 1 mg via INTRAVENOUS
  Filled 2016-06-29: qty 1

## 2016-06-29 MED ORDER — FENTANYL CITRATE (PF) 100 MCG/2ML IJ SOLN
INTRAMUSCULAR | Status: DC | PRN
  Administered 2016-06-29: 100 ug via INTRAVENOUS

## 2016-06-29 MED ORDER — BUPIVACAINE HCL (PF) 0.5 % IJ SOLN
INTRAMUSCULAR | Status: AC
Start: 2016-06-29 — End: 2016-06-29
  Filled 2016-06-29: qty 30

## 2016-06-29 MED ORDER — MIDAZOLAM HCL 2 MG/2ML IJ SOLN
INTRAMUSCULAR | Status: DC | PRN
  Administered 2016-06-29: 2 mg via INTRAVENOUS

## 2016-06-29 MED ORDER — LEVALBUTEROL HCL 1.25 MG/0.5ML IN NEBU: 1.2500 mg | INHALATION_SOLUTION | Freq: Four times a day (QID) | RESPIRATORY_TRACT | Status: DC | PRN

## 2016-06-29 MED ORDER — LIDOCAINE HCL (PF) 1 % IJ SOLN
INTRAMUSCULAR | Status: AC
  Filled 2016-06-29: qty 30

## 2016-06-29 MED ORDER — LACTATED RINGERS IV BOLUS (SEPSIS)
1000.0000 mL | Freq: Once | INTRAVENOUS | Status: AC
  Administered 2016-06-29: 1000 mL via INTRAVENOUS

## 2016-06-29 MED ORDER — TIOTROPIUM BROMIDE MONOHYDRATE 1.25 MCG/ACT IN AERS: 1.0000 | INHALATION_SPRAY | Freq: Every day | RESPIRATORY_TRACT | Status: DC

## 2016-06-29 MED ORDER — IPRATROPIUM-ALBUTEROL 0.5-2.5 (3) MG/3ML IN SOLN: 3.0000 mL | Freq: Once | RESPIRATORY_TRACT | Status: DC

## 2016-06-29 MED ORDER — SUCCINYLCHOLINE CHLORIDE 20 MG/ML IJ SOLN
INTRAMUSCULAR | Status: DC | PRN
  Administered 2016-06-29: 100 mg via INTRAVENOUS
  Administered 2016-06-29: 30 mg via INTRAVENOUS

## 2016-06-29 MED ORDER — IPRATROPIUM-ALBUTEROL 0.5-2.5 (3) MG/3ML IN SOLN
3.0000 mL | RESPIRATORY_TRACT | Status: DC | PRN
  Administered 2016-06-30: 3 mL via RESPIRATORY_TRACT
  Filled 2016-06-29: qty 3

## 2016-06-29 MED ORDER — SODIUM CHLORIDE 0.9 % IV BOLUS (SEPSIS)
500.0000 mL | Freq: Once | INTRAVENOUS | Status: AC
  Administered 2016-06-29: 500 mL via INTRAVENOUS

## 2016-06-29 MED ORDER — OXYCODONE-ACETAMINOPHEN 5-325 MG PO TABS
1.0000 | ORAL_TABLET | ORAL | Status: DC | PRN
  Administered 2016-06-29 – 2016-06-30 (×2): 2 via ORAL
  Filled 2016-06-29 (×2): qty 2

## 2016-06-29 MED ORDER — POTASSIUM CHLORIDE IN NACL 20-0.45 MEQ/L-% IV SOLN
INTRAVENOUS | Status: DC
  Administered 2016-06-29: 06:00:00 via INTRAVENOUS
  Filled 2016-06-29 (×2): qty 1000

## 2016-06-29 MED ORDER — METOPROLOL TARTRATE 5 MG/5ML IV SOLN
INTRAVENOUS | Status: DC | PRN
  Administered 2016-06-29: 2 mg via INTRAVENOUS
  Administered 2016-06-29: 3 mg via INTRAVENOUS

## 2016-06-29 MED ORDER — LIDOCAINE HCL (CARDIAC) 20 MG/ML IV SOLN
INTRAVENOUS | Status: DC | PRN
  Administered 2016-06-29: 100 mg via INTRAVENOUS

## 2016-06-29 MED ORDER — ONDANSETRON HCL 4 MG/2ML IJ SOLN
4.0000 mg | Freq: Once | INTRAMUSCULAR | Status: AC | PRN
  Administered 2016-06-29: 4 mg via INTRAVENOUS

## 2016-06-29 MED ORDER — ONDANSETRON HCL 4 MG/2ML IJ SOLN
INTRAMUSCULAR | Status: DC | PRN
  Administered 2016-06-29: 4 mg via INTRAVENOUS

## 2016-06-29 MED ORDER — POTASSIUM CHLORIDE CRYS ER 20 MEQ PO TBCR
40.0000 meq | EXTENDED_RELEASE_TABLET | Freq: Once | ORAL | Status: AC
  Administered 2016-06-29: 40 meq via ORAL
  Filled 2016-06-29: qty 2

## 2016-06-29 MED ORDER — IOPAMIDOL (ISOVUE-370) INJECTION 76%
100.0000 mL | Freq: Once | INTRAVENOUS | Status: AC | PRN
  Administered 2016-06-29: 100 mL via INTRAVENOUS

## 2016-06-29 MED ORDER — MONTELUKAST SODIUM 10 MG PO TABS
10.0000 mg | ORAL_TABLET | Freq: Every day | ORAL | Status: DC
  Administered 2016-06-29 – 2016-07-01 (×3): 10 mg via ORAL
  Filled 2016-06-29 (×3): qty 1

## 2016-06-29 MED ORDER — FENTANYL CITRATE (PF) 100 MCG/2ML IJ SOLN: 25.0000 ug | INTRAMUSCULAR | Status: DC | PRN

## 2016-06-29 MED ORDER — METHYLPREDNISOLONE SODIUM SUCC 125 MG IJ SOLR
INTRAMUSCULAR | Status: DC | PRN
  Administered 2016-06-29: 125 mg via INTRAVENOUS

## 2016-06-29 MED ORDER — ONDANSETRON HCL 4 MG/2ML IJ SOLN
4.0000 mg | Freq: Once | INTRAMUSCULAR | Status: AC
  Administered 2016-06-29: 4 mg via INTRAVENOUS

## 2016-06-29 MED ORDER — IPRATROPIUM-ALBUTEROL 0.5-2.5 (3) MG/3ML IN SOLN
RESPIRATORY_TRACT | Status: AC
  Filled 2016-06-29: qty 3

## 2016-06-29 MED ORDER — DEXAMETHASONE SODIUM PHOSPHATE 10 MG/ML IJ SOLN
INTRAMUSCULAR | Status: DC | PRN
  Administered 2016-06-29: 10 mg via INTRAVENOUS

## 2016-06-29 MED ORDER — SODIUM CHLORIDE 0.9 % IV SOLN
INTRAVENOUS | Status: DC
  Administered 2016-06-29: 14:00:00 via INTRAVENOUS

## 2016-06-29 MED ORDER — FLUMAZENIL 0.5 MG/5ML IV SOLN
INTRAVENOUS | Status: DC | PRN
  Administered 2016-06-29: 0.2 mg via INTRAVENOUS

## 2016-06-29 MED ORDER — PREDNISONE 10 MG PO TABS
10.0000 mg | ORAL_TABLET | Freq: Every day | ORAL | Status: DC
  Administered 2016-06-30 – 2016-07-01 (×2): 10 mg via ORAL
  Filled 2016-06-29 (×3): qty 1

## 2016-06-29 MED ORDER — ACETAMINOPHEN 10 MG/ML IV SOLN
INTRAVENOUS | Status: DC | PRN
  Administered 2016-06-29: 1000 mg via INTRAVENOUS

## 2016-06-29 MED ORDER — ACETAMINOPHEN 10 MG/ML IV SOLN
INTRAVENOUS | Status: AC
  Filled 2016-06-29: qty 100

## 2016-06-29 MED ORDER — DILTIAZEM HCL 25 MG/5ML IV SOLN
INTRAVENOUS | Status: AC
  Administered 2016-06-29 (×2): 10 mg via INTRAVENOUS
  Filled 2016-06-29: qty 5

## 2016-06-29 MED ORDER — LACTATED RINGERS IV SOLN
INTRAVENOUS | Status: DC
  Administered 2016-06-29: 13:00:00 via INTRAVENOUS

## 2016-06-29 MED ORDER — FLUTICASONE FUROATE-VILANTEROL 200-25 MCG/INH IN AEPB
1.0000 | INHALATION_SPRAY | Freq: Every day | RESPIRATORY_TRACT | Status: DC
Start: 2016-06-29 — End: 2016-07-01
  Administered 2016-06-29 – 2016-07-01 (×3): 1 via RESPIRATORY_TRACT
  Filled 2016-06-29: qty 28

## 2016-06-29 MED ORDER — IPRATROPIUM-ALBUTEROL 0.5-2.5 (3) MG/3ML IN SOLN
3.0000 mL | RESPIRATORY_TRACT | Status: DC | PRN
Start: 2016-06-29 — End: 2016-06-29
  Administered 2016-06-29: 3 mL via RESPIRATORY_TRACT

## 2016-06-29 MED ORDER — ONDANSETRON HCL 4 MG/2ML IJ SOLN
INTRAMUSCULAR | Status: AC
  Filled 2016-06-29: qty 2

## 2016-06-29 MED ORDER — THEOPHYLLINE ER 100 MG PO CP24
100.0000 mg | ORAL_CAPSULE | Freq: Every day | ORAL | Status: DC
Start: 2016-06-29 — End: 2016-07-01
  Administered 2016-06-29 – 2016-07-01 (×3): 100 mg via ORAL
  Filled 2016-06-29 (×3): qty 1

## 2016-06-29 MED ORDER — PHENTERMINE HCL 37.5 MG PO TABS: 37.5000 mg | ORAL_TABLET | Freq: Every day | ORAL | Status: DC

## 2016-06-29 MED ORDER — HYDROMORPHONE HCL 1 MG/ML IJ SOLN
0.5000 mg | Freq: Once | INTRAMUSCULAR | Status: AC
  Administered 2016-06-29: 0.5 mg via INTRAVENOUS
  Filled 2016-06-29: qty 1

## 2016-06-29 MED ORDER — PROPOFOL 10 MG/ML IV BOLUS
INTRAVENOUS | Status: DC | PRN
  Administered 2016-06-29: 130 mg via INTRAVENOUS
  Administered 2016-06-29: 40 mg via INTRAVENOUS

## 2016-06-29 MED ORDER — PHENYLEPHRINE HCL 10 MG/ML IJ SOLN
INTRAMUSCULAR | Status: DC | PRN
  Administered 2016-06-29: 100 ug via INTRAVENOUS

## 2016-06-29 MED ORDER — METOPROLOL TARTRATE 5 MG/5ML IV SOLN
5.0000 mg | INTRAVENOUS | Status: DC | PRN
  Administered 2016-06-29: 5 mg via INTRAVENOUS
  Filled 2016-06-29: qty 5

## 2016-06-29 SURGICAL SUPPLY — 27 items
BLADE SURG 15 STRL LF DISP TIS (BLADE) ×2 IMPLANT
BLADE SURG 15 STRL SS (BLADE) ×1
BNDG GAUZE 4.5X4.1 6PLY STRL (MISCELLANEOUS) IMPLANT
CHLORAPREP W/TINT 26ML (MISCELLANEOUS) IMPLANT
DRAIN PENROSE 1/4X12 LTX (DRAIN) ×6 IMPLANT
DRAPE LAPAROTOMY 100X77 ABD (DRAPES) ×3 IMPLANT
DRAPE UTILITY 15X26 TOWEL STRL (DRAPES) IMPLANT
ELECT CAUTERY BLADE 6.4 (BLADE) ×3 IMPLANT
ELECT REM PT RETURN 9FT ADLT (ELECTROSURGICAL) ×3
ELECTRODE REM PT RTRN 9FT ADLT (ELECTROSURGICAL) ×2 IMPLANT
GAUZE SPONGE 4X4 12PLY STRL (GAUZE/BANDAGES/DRESSINGS) ×6 IMPLANT
GLOVE BIO SURGEON STRL SZ8 (GLOVE) ×12 IMPLANT
GOWN STRL REUS W/ TWL LRG LVL3 (GOWN DISPOSABLE) ×4 IMPLANT
GOWN STRL REUS W/TWL LRG LVL3 (GOWN DISPOSABLE) ×2
KIT RM TURNOVER STRD PROC AR (KITS) ×3 IMPLANT
LABEL OR SOLS (LABEL) ×3 IMPLANT
NEEDLE HYPO 25X1 1.5 SAFETY (NEEDLE) ×3 IMPLANT
NS IRRIG 500ML POUR BTL (IV SOLUTION) ×3 IMPLANT
PACK BASIN MINOR ARMC (MISCELLANEOUS) ×6 IMPLANT
PACK EXTREMITY ARMC (MISCELLANEOUS) IMPLANT
PAD ABD DERMACEA PRESS 5X9 (GAUZE/BANDAGES/DRESSINGS) ×6 IMPLANT
SCRUB POVIDONE IODINE 4 OZ (MISCELLANEOUS) ×3 IMPLANT
SPONGE LAP 18X18 5 PK (GAUZE/BANDAGES/DRESSINGS) ×6 IMPLANT
STOCKINETTE STRL 4IN 9604848 (GAUZE/BANDAGES/DRESSINGS) ×3 IMPLANT
SWAB CULTURE AMIES ANAERIB BLU (MISCELLANEOUS) IMPLANT
SYR BULB EAR ULCER 3OZ GRN STR (SYRINGE) IMPLANT
SYRINGE 10CC LL (SYRINGE) ×3 IMPLANT

## 2016-06-29 NOTE — Progress Notes (Signed)
Patient seen after returning from CT scan. Patient tachycardic and sob. Patient not able to speak in full sentences due to sob.  BBS diminished but clear.  Some audible airway wheezes noted at time of assessment.  ABG obtained on 2 liter nasal cannula.  Spouse states that patient has COPD and is sob most of the time however not to this extent.  Patient did not tolerate auto cpap due to airway dryness.  She did state however that she has had some dryness issues with her home unit as well. SVN given to patient to see if would help with sob.  Patient states she  Takes prn treatments at home as well.  Pain medication given by RN.  Patient states has abscess which may be causing pain, associating sob.  Will continue to monitor

## 2016-06-29 NOTE — Anesthesia Procedure Notes (Signed)
Procedure Name: Intubation Date/Time: 06/29/2016 2:46 PM Performed by: Doreen Salvage Pre-anesthesia Checklist: Patient identified, Patient being monitored, Timeout performed, Emergency Drugs available and Suction available Patient Re-evaluated:Patient Re-evaluated prior to inductionOxygen Delivery Method: Circle system utilized Preoxygenation: Pre-oxygenation with 100% oxygen Intubation Type: IV induction Ventilation: Mask ventilation without difficulty Laryngoscope Size: Mac and 3 Grade View: Grade I Tube type: Oral Tube size: 7.0 mm Number of attempts: 1 Airway Equipment and Method: Stylet Placement Confirmation: ETT inserted through vocal cords under direct vision,  positive ETCO2 and breath sounds checked- equal and bilateral Secured at: 21 cm Tube secured with: Tape Dental Injury: Teeth and Oropharynx as per pre-operative assessment

## 2016-06-29 NOTE — Op Note (Signed)
06/28/2016 - 06/29/2016  3:37 PM  PATIENT:  Laura [name]  66 y.o. female  PRE-OPERATIVE DIAGNOSIS:  Right axillary abscess, left buttock abscess  POST-OPERATIVE DIAGNOSIS:  Same  PROCEDURE: Examination under anesthesia, #1 incision and drainage of right axillary abscess #2 incision and drainage of left buttock abscess, perirectal  SURGEON:  Florene Glen MD, FACS   ANESTHESIA:  Gen. with endotracheal tube   Details of Procedure: This a patient with a right axillary abscess and a left buttock abscess possibly perirectal whose pain is limiting her so much that she cannot be examined properly she is tachycardic with a high white blood cell count and requires incision and drainage after examination under anesthesia.  Preoperative discussed rationale for surgery the options of observation risk bleeding infection recurrent infection drain placement or open wound she understood and agreed to proceed  Patient was induced to general anesthesia placed in a high lithotomy position and prepped and draped. A surgical positive was performed. Inspection of the left buttock area demonstrated a large indurated area in the left buttock near the perirectal area and incision was made and clamp and finger dissection demonstrated a large pocket of clear fluid with some serosanguineous content this was cultured. And then a Penrose drain was placed into the cavity and held in with 3-0 nylon and a sterile dressing was placed.  Attention was turned to the right axilla where a previously draining area was identified palpation of the area demonstrated a considerable amount of induration this wound was incised and gentle finger dissection demonstrated a large cavity full of purulent material which was cultured. Patients are broken up with finger dissection. A counter incision was made anteriorly on the right arm and a Penrose drain was placed through the counter incision into the cavity and out the initial site of  drainage. These were held in with 30 nylons. Dressings were placed.  Taken down from lithotomy position awoken and taken to the recovery room in stable condition to be admitted for continued IV antibiotic care  Sponge lap needle count was correct   Florene Glen, MD FACS

## 2016-06-29 NOTE — Progress Notes (Signed)
Called Dr. Claria Dice regarding patient's heart rate- 150.  Doctor put in appropriate orders.  [name]  06/29/2016 3:19 AM

## 2016-06-29 NOTE — Consult Note (Signed)
Surgical Consultation  06/29/2016  Laura [name] is an 65 y.o. female.   CC: Right axillary pain and left buttock pain  HPI: This patient who is on vacation last week and did not come to the emergency room to last night with 1 week of axillary pain and buttock pain right axilla left buttock. She was diagnosed with cellulitis with possible abscess and has had spontaneous drainage from the right axilla has been started on antibiotics. She is morbidly obese with known COPD and hypertension. She thinks she's had fevers and certainly has had chills.  Past Medical History  Diagnosis Date  . Asthma   . Hypertension     History reviewed. No pertinent past surgical history.  No family history on file.  Social History:  reports that she has never smoked. She does not have any smokeless tobacco history on file. Her alcohol and drug histories are not on file.  Allergies: No Known Allergies  Medications reviewed.   Review of Systems:   Review of Systems  Constitutional: Positive for fever and chills. Negative for weight loss.  HENT: Negative.   Eyes: Negative.   Respiratory: Positive for shortness of breath and wheezing. Negative for cough, hemoptysis and sputum production.   Cardiovascular: Negative for chest pain, palpitations and orthopnea.  Gastrointestinal: Negative.   Genitourinary: Negative.   Musculoskeletal: Negative.   Skin:       Drainage from right axilla pain and right axilla and left buttock  Neurological: Negative.   Endo/Heme/Allergies: Negative.   Psychiatric/Behavioral: Negative.      Physical Exam:  BP 127/66 mmHg  Pulse 150  Temp(Src) 98.2 F (36.8 C) (Oral)  Resp 20  Ht 5' 4.5" (1.638 m)  Wt 302 lb (136.986 kg)  BMI 51.06 kg/m2  SpO2 98%  Physical Exam  Constitutional: She is oriented to person, place, and time. She appears distressed.  Uncomfortable-appearing morbidly obese female patient who does not want to move her right arm and has trouble  rolling over.  HENT:  Head: Normocephalic and atraumatic.  Eyes: Pupils are equal, round, and reactive to light. Right eye exhibits no discharge. Left eye exhibits no discharge. No scleral icterus.  Neck: Normal range of motion.  Cardiovascular: Normal rate, regular rhythm and normal heart sounds.   Pulmonary/Chest: Effort normal. No respiratory distress. She has wheezes. She has no rales. She exhibits no tenderness.  Abdominal: Soft. She exhibits no distension. There is no tenderness. There is no rebound.  Obese  Musculoskeletal: She exhibits edema and tenderness.  Right arm edema  Lymphadenopathy:    She has no cervical adenopathy.  Neurological: She is alert and oriented to person, place, and time.  Skin: Skin is warm. She is not diaphoretic. There is erythema.  Right axilla: Draining area approximately 1 x 1.5 cm with tenderness and purulence erythema and warmth  Left buttock with approximately 5 x 5 cm induration and erythema which is tender but nonfluctuant and no drainage  Psychiatric: Mood and affect normal.  Vitals reviewed.     Results for orders placed or performed during the hospital encounter of 06/28/16 (from the past 48 hour(s))  Comprehensive metabolic panel     Status: Abnormal   Collection Time: 06/28/16  2:35 PM  Result Value Ref Range   Sodium 136 135 - 145 mmol/L   Potassium 3.4 (L) 3.5 - 5.1 mmol/L   Chloride 98 (L) 101 - 111 mmol/L   CO2 26 22 - 32 mmol/L   Glucose, Bld 106 (H)  65 - 99 mg/dL   BUN 19 6 - 20 mg/dL   Creatinine, Ser 1.13 (H) 0.44 - 1.00 mg/dL   Calcium 8.8 (L) 8.9 - 10.3 mg/dL   Total Protein 6.9 6.5 - 8.1 g/dL   Albumin 3.4 (L) 3.5 - 5.0 g/dL   AST 14 (L) 15 - 41 U/L   ALT 14 14 - 54 U/L   Alkaline Phosphatase 76 38 - 126 U/L   Total Bilirubin 1.2 0.3 - 1.2 mg/dL   GFR calc non Af Amer 50 (L) >60 mL/min   GFR calc Af Amer 57 (L) >60 mL/min    Comment: (NOTE) The eGFR has been calculated using the CKD EPI equation. This calculation  has not been validated in all clinical situations. eGFR's persistently <60 mL/min signify possible Chronic Kidney Disease.    Anion gap 12 5 - 15  CBC     Status: Abnormal   Collection Time: 06/28/16  2:35 PM  Result Value Ref Range   WBC 21.6 (H) 3.6 - 11.0 K/uL   RBC 4.86 3.80 - 5.20 MIL/uL   Hemoglobin 14.6 12.0 - 16.0 g/dL   HCT 42.8 35.0 - 47.0 %   MCV 88.2 80.0 - 100.0 fL   MCH 30.1 26.0 - 34.0 pg   MCHC 34.1 32.0 - 36.0 g/dL   RDW 14.8 (H) 11.5 - 14.5 %   Platelets 239 150 - 440 K/uL  Magnesium     Status: None   Collection Time: 06/28/16  2:35 PM  Result Value Ref Range   Magnesium 2.0 1.7 - 2.4 mg/dL  TSH     Status: None   Collection Time: 06/28/16  2:35 PM  Result Value Ref Range   TSH 2.446 0.350 - 4.500 uIU/mL  Aerobic Culture (superficial specimen)     Status: None (Preliminary result)   Collection Time: 06/28/16  8:48 PM  Result Value Ref Range   Specimen Description AXILLA RIGHT ABSCESS    Special Requests NONE    Gram Stain      MODERATE WBC PRESENT, PREDOMINANTLY PMN FEW GRAM POSITIVE COCCI IN CLUSTERS Performed at [facility]    Culture PENDING    Report Status PENDING   Urinalysis complete, with microscopic ([facility] only)     Status: Abnormal   Collection Time: 06/29/16  2:15 AM  Result Value Ref Range   Color, Urine YELLOW (A) YELLOW   APPearance HAZY (A) CLEAR   Glucose, UA NEGATIVE NEGATIVE mg/dL   Bilirubin Urine NEGATIVE NEGATIVE   Ketones, ur 1+ (A) NEGATIVE mg/dL   Specific Gravity, Urine 1.014 1.005 - 1.030   Hgb urine dipstick 2+ (A) NEGATIVE   pH 5.0 5.0 - 8.0   Protein, ur NEGATIVE NEGATIVE mg/dL   Nitrite NEGATIVE NEGATIVE   Leukocytes, UA NEGATIVE NEGATIVE   RBC / HPF 0-5 0 - 5 RBC/hpf   WBC, UA 0-5 0 - 5 WBC/hpf   Bacteria, UA RARE (A) NONE SEEN   Squamous Epithelial / LPF 0-5 (A) NONE SEEN   Mucous PRESENT   Blood gas, arterial     Status: Abnormal   Collection Time: 06/29/16  4:30 AM  Result Value Ref Range   FIO2  0.28    pH, Arterial 7.45 7.350 - 7.450   pCO2 arterial 34 32.0 - 48.0 mmHg   pO2, Arterial 82 (L) 83.0 - 108.0 mmHg   Bicarbonate 23.6 21.0 - 28.0 mEq/L   Acid-Base Excess 0.0 0.0 - 3.0 mmol/L   O2 Saturation  96.5 %   Patient temperature 37.0    Collection site RIGHT RADIAL    Sample type ARTERIAL DRAW    Allens test (pass/fail) PASS PASS  Basic metabolic panel     Status: Abnormal   Collection Time: 06/29/16  5:52 AM  Result Value Ref Range   Sodium 136 135 - 145 mmol/L   Potassium 3.2 (L) 3.5 - 5.1 mmol/L   Chloride 105 101 - 111 mmol/L   CO2 24 22 - 32 mmol/L   Glucose, Bld 93 65 - 99 mg/dL   BUN 13 6 - 20 mg/dL   Creatinine, Ser 0.81 0.44 - 1.00 mg/dL   Calcium 7.8 (L) 8.9 - 10.3 mg/dL   GFR calc non Af Amer >60 >60 mL/min   GFR calc Af Amer >60 >60 mL/min    Comment: (NOTE) The eGFR has been calculated using the CKD EPI equation. This calculation has not been validated in all clinical situations. eGFR's persistently <60 mL/min signify possible Chronic Kidney Disease.    Anion gap 7 5 - 15  CBC     Status: Abnormal   Collection Time: 06/29/16  5:52 AM  Result Value Ref Range   WBC 13.6 (H) 3.6 - 11.0 K/uL   RBC 4.09 3.80 - 5.20 MIL/uL   Hemoglobin 12.6 12.0 - 16.0 g/dL   HCT 36.5 35.0 - 47.0 %   MCV 89.2 80.0 - 100.0 fL   MCH 30.7 26.0 - 34.0 pg   MCHC 34.4 32.0 - 36.0 g/dL   RDW 15.0 (H) 11.5 - 14.5 %   Platelets 216 150 - 440 K/uL   Dg Chest 1 View  06/29/2016  CLINICAL DATA:  Shortness of breath. EXAM: CHEST 1 VIEW COMPARISON:  12/07/2015 FINDINGS: Chronic bronchitic changes are stable allowing for differences in technique. Cardiomediastinal contours are unchanged. No focal airspace disease, large pleural effusion or pneumothorax. Lung base evaluation partially obscured by soft tissue attenuation from body habitus. IMPRESSION: No active disease.  Stable chronic bronchitic change. Electronically Signed   By: Jeb Levering M.D.   On: 06/29/2016 03:39   Ct Angio  Chest Pe W Or Wo Contrast  06/29/2016  CLINICAL DATA:  Acute onset of shortness of breath. Right axillary swelling and pain. Nausea and vomiting. Chills. Leukocytosis. Pus arising from the right axilla. Initial encounter. EXAM: CT ANGIOGRAPHY CHEST WITH CONTRAST TECHNIQUE: Multidetector CT imaging of the chest was performed using the standard protocol during bolus administration of intravenous contrast. Multiplanar CT image reconstructions and MIPs were obtained to evaluate the vascular anatomy. CONTRAST:  100 mL of Isovue 370 IV contrast COMPARISON:  Chest radiograph performed earlier today at 3:11 a.m., and CTA of the chest performed 06/28/2012 FINDINGS: There is no evidence of pulmonary embolus. Minimal bibasilar atelectasis is noted. The lungs are otherwise clear. There is no evidence of significant focal consolidation, pleural effusion or pneumothorax. No masses are identified; no abnormal focal contrast enhancement is seen. Vague diffuse soft tissue inflammation and phlegmon are seen tracking along the proximal right upper arm and right axilla, with mild soft tissue inflammation tracking about the right pectoralis musculature, and associated muscle edema. There appears to be focal erosion along the anterior aspect of the right humeral head, raising suspicion for osteomyelitis. MRI of the right shoulder is recommended for further evaluation. If there is significant concern for abscess, contrast could be considered. Scattered coronary artery calcifications are seen. No pericardial effusion is identified. No mediastinal lymphadenopathy is appreciated. The great vessels are grossly unremarkable  in appearance. No axillary lymphadenopathy is seen. The visualized portions of the thyroid gland are unremarkable in appearance. The visualized portions of the liver and spleen are unremarkable. The visualized portions of the pancreas, gallbladder, stomach and adrenal glands are within normal limits. No acute osseous  abnormalities are seen. Review of the MIP images confirms the above findings. IMPRESSION: 1. No evidence pulmonary embolus. 2. Minimal bibasilar atelectasis noted.  Lungs otherwise clear. 3. Vague diffuse soft tissue inflammation and phlegmon tracking along the proximal right upper arm and right axilla, with soft tissue inflammation tracking about the right pectoralis musculature, and associated muscle edema. This is compatible with diffuse soft tissue infection and likely underlying myositis. 4. Apparent focal erosion along the anterior aspect of the right humeral head, raising suspicion for osteomyelitis. MRI of the right shoulder is recommended for further evaluation. If there is significant clinical concern for abscess, contrast could be considered. 5. Scattered coronary artery calcifications seen. These results were called by telephone at the time of interpretation on 06/29/2016 at 5:37 am to Dr. Quintella Baton, who verbally acknowledged these results. Electronically Signed   By: Garald Balding M.D.   On: 06/29/2016 05:47    Assessment/Plan:  Morbidly obese patient with COPD and hypertension. I was asked see the patient for right axillary abscess and cellulitis which is spontaneously draining but quite tender. She also has a left buttock induration with erythema and tenderness Patient was not made nothing by mouth and was taking clear liquids this morning. I will make her nothing by mouth and reexamine her this afternoon she may require incision and drainage of both of these areas for adequate drainage. I will discuss the options and rationale as well as risks later should the decision to operate be made this afternoon.   Florene Glen, MD, FACS

## 2016-06-29 NOTE — Transfer of Care (Signed)
Immediate Anesthesia Transfer of Care Note  Patient: Laura [name]  Procedure(s) Performed: Procedure(s): INCISION AND DRAINAGE ABSCESS (Right) IRRIGATION AND DEBRIDEMENT WOUND (Left)  Patient Location: PACU  Anesthesia Type:General  Level of Consciousness: sedated  Airway & Oxygen Therapy: Patient Spontanous Breathing and Patient connected to face mask oxygen  Post-op Assessment: Report given to RN and Post -op Vital signs reviewed and stable  Post vital signs: Reviewed and stable  Last Vitals:  Filed Vitals:   06/29/16 0838 06/29/16 1528  BP:  126/85  Pulse:  142  Temp: 36.8 C 36.7 C  Resp:  17    Complications: No apparent anesthesia complications

## 2016-06-29 NOTE — Progress Notes (Signed)
Notified Dr Bridgett Larsson of pt continued tachycardia; orders received

## 2016-06-29 NOTE — Anesthesia Preprocedure Evaluation (Addendum)
Anesthesia Evaluation  Patient identified by MRN, date of birth, ID band Patient awake    Reviewed: Allergy & Precautions, NPO status , Patient's Chart, lab work & pertinent test results  Airway Mallampati: III  TM Distance: >3 FB Neck ROM: Full    Dental  (+) Caps, Chipped   Pulmonary asthma ,    Pulmonary exam normal        Cardiovascular hypertension, Pt. on medications + dysrhythmias  Rate:Tachycardia     Neuro/Psych negative neurological ROS  negative psych ROS   GI/Hepatic   Endo/Other    Renal/GU Renal InsufficiencyRenal disease     Musculoskeletal   Abdominal (+) + obese,   Peds negative pediatric ROS (+)  Hematology   Anesthesia Other Findings   Reproductive/Obstetrics                           Anesthesia Physical Anesthesia Plan  ASA: III and emergent  Anesthesia Plan: General   Post-op Pain Management:    Induction: Intravenous  Airway Management Planned: Oral ETT  Additional Equipment:   Intra-op Plan:   Post-operative Plan: Extubation in OR  Informed Consent: I have reviewed the patients History and Physical, chart, labs and discussed the procedure including the risks, benefits and alternatives for the proposed anesthesia with the patient or authorized representative who has indicated his/her understanding and acceptance.   Dental advisory given  Plan Discussed with: CRNA and Surgeon  Anesthesia Plan Comments: (High risk , septic patient with tacchycardia and possibly some demand ischemia picture .Marland Kitchen Talked case over with Dr. Burt Knack who feels the case is Emergent.  We will control HR/BP with supportive measures for quick I and D.  We believe that waiting will incur more morbidity Then going for definitive I and D.. Patient understands and agrees.)       Anesthesia Quick Evaluation

## 2016-06-29 NOTE — Progress Notes (Signed)
Patient visited and examined again. She remains tachycardic and in considerable pain especially in the right axilla.  My suggestion is that we proceed with examination under anesthesia with probable incision and drainage of a right axillary abscess which is spontaneously draining and a left buttock abscess.  I discussed with she and her family the rationale for offering this procedure and the options of continued observation and the risks of bleeding infection recurrent infection recurrent abscess the need for further surgery and an open wound versus [place] drains were all discussed the understood and agreed to proceed

## 2016-06-29 NOTE — Progress Notes (Addendum)
[facility] [place] NAME: Laura [name]    MR#:  [id number]  DATE OF BIRTH:  [birth year]  SUBJECTIVE:  CHIEF COMPLAINT:   Chief Complaint  Patient presents with  . Cellulitis   Right axilla pain and mild SOB. On O2 [place] 2L. Tachycardia at 140-150 this am. REVIEW OF SYSTEMS:  CONSTITUTIONAL: No fever,has generalized weakness.  EYES: No blurred or double vision.  EARS, NOSE, AND THROAT: No tinnitus or ear pain.  RESPIRATORY: No cough, has shortness of breath, no wheezing or hemoptysis.  CARDIOVASCULAR: No chest pain, orthopnea, edema.  GASTROINTESTINAL: No nausea, vomiting, diarrhea or abdominal pain.  GENITOURINARY: No dysuria, hematuria.  ENDOCRINE: No polyuria, nocturia,  HEMATOLOGY: No anemia, easy bruising or bleeding SKIN: No rash or lesion. MUSCULOSKELETAL: Right axilla pain.   NEUROLOGIC: No tingling, numbness, weakness.  PSYCHIATRY: No anxiety or depression.   DRUG ALLERGIES:  No Known Allergies  VITALS:  Blood pressure 127/66, pulse 150, temperature 98.2 F (36.8 C), temperature source Oral, resp. rate 20, height 5' 4.5" (1.638 m), weight 302 lb (136.986 kg), SpO2 98 %.  PHYSICAL EXAMINATION:  GENERAL:  65 y.o.-year-old patient lying in the bed with no acute distress. Morbid obese. EYES: Pupils equal, round, reactive to light and accommodation. No scleral icterus. Extraocular muscles intact.  HEENT: Head atraumatic, normocephalic. Oropharynx and nasopharynx clear.  NECK:  Supple, no jugular venous distention. No thyroid enlargement, no tenderness.  LUNGS: Normal breath sounds bilaterally, no wheezing, rales,rhonchi or crepitation. No use of accessory muscles of respiration.  CARDIOVASCULAR: S1, S2 normal. No murmurs, rubs, or gallops.  ABDOMEN: Soft, nontender, nondistended. Bowel sounds present. No organomegaly or mass.  EXTREMITIES: No pedal edema, cyanosis, or clubbing. Abscess 1x1.5 cm and  drainage on right  axilla with tenderness and purulence erythema and warmth; Left buttock with approximately 5 x 5 cm induration and erythema which is tender but nonfluctuant and no drainage.  NEUROLOGIC: Cranial nerves II through XII are intact. Muscle strength 5/5 in all extremities except right arm (unable to move due to pain on right shoulder and axilla). Sensation intact. Gait not checked.  PSYCHIATRIC: The patient is alert and oriented x 3.  SKIN: No obvious rash, lesion, or ulcer.    LABORATORY PANEL:   CBC  Recent Labs Lab 06/29/16 0552  WBC 13.6*  HGB 12.6  HCT 36.5  PLT 216   ------------------------------------------------------------------------------------------------------------------  Chemistries   Recent Labs Lab 06/28/16 1435 06/29/16 0552  NA 136 136  K 3.4* 3.2*  CL 98* 105  CO2 26 24  GLUCOSE 106* 93  BUN 19 13  CREATININE 1.13* 0.81  CALCIUM 8.8* 7.8*  MG 2.0  --   AST 14*  --   ALT 14  --   ALKPHOS 76  --   BILITOT 1.2  --    ------------------------------------------------------------------------------------------------------------------  Cardiac Enzymes No results for input(s): TROPONINI in the last 168 hours. ------------------------------------------------------------------------------------------------------------------  RADIOLOGY:  Dg Chest 1 View  06/29/2016  CLINICAL DATA:  Shortness of breath. EXAM: CHEST 1 VIEW COMPARISON:  12/07/2015 FINDINGS: Chronic bronchitic changes are stable allowing for differences in technique. Cardiomediastinal contours are unchanged. No focal airspace disease, large pleural effusion or pneumothorax. Lung base evaluation partially obscured by soft tissue attenuation from body habitus. IMPRESSION: No active disease.  Stable chronic bronchitic change. Electronically Signed   By: Jeb Levering M.D.   On: 06/29/2016 03:39   Ct Angio Chest Pe W Or Wo Contrast  06/29/2016  CLINICAL DATA:  Acute onset of shortness of breath. Right  axillary swelling and pain. Nausea and vomiting. Chills. Leukocytosis. Pus arising from the right axilla. Initial encounter. EXAM: CT ANGIOGRAPHY CHEST WITH CONTRAST TECHNIQUE: Multidetector CT imaging of the chest was performed using the standard protocol during bolus administration of intravenous contrast. Multiplanar CT image reconstructions and MIPs were obtained to evaluate the vascular anatomy. CONTRAST:  100 mL of Isovue 370 IV contrast COMPARISON:  Chest radiograph performed earlier today at 3:11 a.m., and CTA of the chest performed 06/28/2012 FINDINGS: There is no evidence of pulmonary embolus. Minimal bibasilar atelectasis is noted. The lungs are otherwise clear. There is no evidence of significant focal consolidation, pleural effusion or pneumothorax. No masses are identified; no abnormal focal contrast enhancement is seen. Vague diffuse soft tissue inflammation and phlegmon are seen tracking along the proximal right upper arm and right axilla, with mild soft tissue inflammation tracking about the right pectoralis musculature, and associated muscle edema. There appears to be focal erosion along the anterior aspect of the right humeral head, raising suspicion for osteomyelitis. MRI of the right shoulder is recommended for further evaluation. If there is significant concern for abscess, contrast could be considered. Scattered coronary artery calcifications are seen. No pericardial effusion is identified. No mediastinal lymphadenopathy is appreciated. The great vessels are grossly unremarkable in appearance. No axillary lymphadenopathy is seen. The visualized portions of the thyroid gland are unremarkable in appearance. The visualized portions of the liver and spleen are unremarkable. The visualized portions of the pancreas, gallbladder, stomach and adrenal glands are within normal limits. No acute osseous abnormalities are seen. Review of the MIP images confirms the above findings. IMPRESSION: 1. No  evidence pulmonary embolus. 2. Minimal bibasilar atelectasis noted.  Lungs otherwise clear. 3. Vague diffuse soft tissue inflammation and phlegmon tracking along the proximal right upper arm and right axilla, with soft tissue inflammation tracking about the right pectoralis musculature, and associated muscle edema. This is compatible with diffuse soft tissue infection and likely underlying myositis. 4. Apparent focal erosion along the anterior aspect of the right humeral head, raising suspicion for osteomyelitis. MRI of the right shoulder is recommended for further evaluation. If there is significant clinical concern for abscess, contrast could be considered. 5. Scattered coronary artery calcifications seen. These results were called by telephone at the time of interpretation on 06/29/2016 at 5:37 am to Dr. Quintella Baton, who verbally acknowledged these results. Electronically Signed   By: Garald Balding M.D.   On: 06/29/2016 05:47    EKG:   Orders placed or performed during the hospital encounter of 06/28/16  . ED EKG  . ED EKG  . EKG 12-Lead  . EKG 12-Lead    ASSESSMENT AND PLAN:   #1. Sepsis due to cellulitis and abscess in the right axilla and left buttock. Continue vancomycin and zosyn, NS iv. Leukocytosis is improving, f/u CBC,  blood cultures and wound cultures.  Tachycardia, due to sepsis. NS bolus, continue NS, get echo.  #2. Cellulitis and abscess of right axilla,  likely underlying myositis and suspicion for osteomyelitis (per CT angio),  Continue vancomycin and zosyn, f/u CBC,  blood cultures and wound cultures. F/u MRI of right shoulder, NPO and Dr. Burt Knack for possible I&D. ID consult.  #3. Hypokalemia, KCl po.  #4  Dehydration, improved with IV fluids.  #5.  Hypoxia with opiates, likely due to obesity hypoventilation/questionable obstructive sleep apnea, opiate administration precautions, continue oxygen as needed. CPAP at nigtht.  #6 morbid obesity.  #  COPD. Stable.  xopenex prn. Continue home nebulizer.  # HTN. Hold HCTZ due to low side BP and hypokalemia.  All the records are reviewed and case discussed with Care Management/Social Workerr. Management plans discussed with the patient, daughter and they are in agreement.  CODE STATUS: full code.  TOTAL TIME TAKING CARE OF THIS PATIENT: 43 minutes.  Greater than 50% time was spent on coordination of care and face-to-face counseling.  POSSIBLE D/C IN 3 DAYS, DEPENDING ON CLINICAL CONDITION.   Demetrios Loll M.D on 06/29/2016 at 10:26 AM  Between 7am to 6pm - Pager - [phone]  After 6pm go to [email or web address] - password [email or web address] [facility]  [facility]  Office  [phone]  CC: Primary care physician; Christie Nottingham., PA

## 2016-06-29 NOTE — Progress Notes (Signed)
PT Cancellation Note  Patient Details Name: Laura [name] MRN: [id number] DOB: [birth year]   Cancelled Treatment:    Reason Eval/Treat Not Completed: Medical issues which prohibited therapy.  Will await PT due to elevated pulse and low O2 sats, pt in pain over abscesses.   Ramond Dial 06/29/2016, 12:50 PM    Mee Hives, PT MS Acute Rehab Dept. Number: [place] [facility] [place]

## 2016-06-29 NOTE — Progress Notes (Signed)
Called Dr Burt Knack to clarify fluids orders; orders received; also notified Dr that pt HR continues to be tachycardic in 140's; Dr stated that at completion of zosyn, if HR still > 130, to give 1L bolus of LR

## 2016-06-29 NOTE — Progress Notes (Signed)
Code by nursing as patient was short of breat, tachycardic and pursed lips breathing. EKG shows normal sinus rhythm. Chest x-ray was normal as was ABG. CT chest x-ray for pneumonia or pulmonary embolus. Patient not wheezing. Patient not able to her CPAP ordered. CT chest shows ague diffuse soft tissue inflammation and phlegmon tracking along the proximal right upper arm and right axilla, with soft tissue inflammation tracking about the right pectoralis musculature, and associated muscle edema. Patient shortness of breath may be secondary to musculoskeletal discomfort from abscess. Morphine changed to Dilaudid. When necessary Lopressor ordered. Duonebs when necessary ordered.  Image and also revealed Apparent focal erosion along the anterior aspect of the right humeral head, raising suspicion for osteomyelitis. MRI of the right shoulder is recommended for further evaluation. MRI of the right shoulder has been ordered.    CLINICAL DATA: Acute onset of shortness of breath. Right axillary swelling and pain. Nausea and vomiting. Chills. Leukocytosis. Pus arising from the right axilla. Initial encounter.  EXAM: CT ANGIOGRAPHY CHEST WITH CONTRAST  TECHNIQUE: Multidetector CT imaging of the chest was performed using the standard protocol during bolus administration of intravenous contrast. Multiplanar CT image reconstructions and MIPs were obtained to evaluate the vascular anatomy.  CONTRAST: 100 mL of Isovue 370 IV contrast  COMPARISON: Chest radiograph performed earlier today at 3:11 a.m., and CTA of the chest performed 06/28/2012  FINDINGS: There is no evidence of pulmonary embolus.  Minimal bibasilar atelectasis is noted. The lungs are otherwise clear. There is no evidence of significant focal consolidation, pleural effusion or pneumothorax. No masses are identified; no abnormal focal contrast enhancement is seen.  Vague diffuse soft tissue inflammation and phlegmon are  seen tracking along the proximal right upper arm and right axilla, with mild soft tissue inflammation tracking about the right pectoralis musculature, and associated muscle edema.  There appears to be focal erosion along the anterior aspect of the right humeral head, raising suspicion for osteomyelitis. MRI of the right shoulder is recommended for further evaluation. If there is significant concern for abscess, contrast could be considered.  Scattered coronary artery calcifications are seen. No pericardial effusion is identified. No mediastinal lymphadenopathy is appreciated. The great vessels are grossly unremarkable in appearance. No axillary lymphadenopathy is seen. The visualized portions of the thyroid gland are unremarkable in appearance.  The visualized portions of the liver and spleen are unremarkable. The visualized portions of the pancreas, gallbladder, stomach and adrenal glands are within normal limits.  No acute osseous abnormalities are seen.  Review of the MIP images confirms the above findings.  IMPRESSION: 1. No evidence pulmonary embolus. 2. Minimal bibasilar atelectasis noted. Lungs otherwise clear. 3. Vague diffuse soft tissue inflammation and phlegmon tracking along the proximal right upper arm and right axilla, with soft tissue inflammation tracking about the right pectoralis musculature, and associated muscle edema. This is compatible with diffuse soft tissue infection and likely underlying myositis. 4. Apparent focal erosion along the anterior aspect of the right humeral head, raising suspicion for osteomyelitis. MRI of the right shoulder is recommended for further evaluation. If there is significant clinical concern for abscess, contrast could be considered. 5. Scattered coronary artery calcifications seen. These results were called by telephone at the time of interpretation on 06/29/2016 at 5:37 am to Dr. Quintella Baton, who verbally acknowledged  these results.   Electronically Signed  By: Garald Balding M.D.  On: 06/29/2016 05:47

## 2016-06-30 ENCOUNTER — Encounter: Payer: Self-pay | Admitting: Surgery

## 2016-06-30 LAB — GLUCOSE, CAPILLARY
GLUCOSE-CAPILLARY: 222 mg/dL — AB (ref 65–99)
Glucose-Capillary: 221 mg/dL — ABNORMAL HIGH (ref 65–99)
Glucose-Capillary: 147 mg/dL — ABNORMAL HIGH (ref 65–99)

## 2016-06-30 LAB — CBC
HCT: 37 % (ref 35.0–47.0)
Hemoglobin: 12.7 g/dL (ref 12.0–16.0)
MCH: 31.1 pg (ref 26.0–34.0)
MCHC: 34.3 g/dL (ref 32.0–36.0)
MCV: 90.6 fL (ref 80.0–100.0)
PLATELETS: 237 10*3/uL (ref 150–440)
RBC: 4.09 MIL/uL (ref 3.80–5.20)
RDW: 15.2 % — ABNORMAL HIGH (ref 11.5–14.5)
WBC: 9.5 10*3/uL (ref 3.6–11.0)

## 2016-06-30 LAB — BASIC METABOLIC PANEL
Anion gap: 6 (ref 5–15)
BUN: 13 mg/dL (ref 6–20)
CHLORIDE: 107 mmol/L (ref 101–111)
CO2: 25 mmol/L (ref 22–32)
CREATININE: 0.78 mg/dL (ref 0.44–1.00)
Calcium: 8.1 mg/dL — ABNORMAL LOW (ref 8.9–10.3)
GFR calc Af Amer: >60 mL/min (ref >60)
GLUCOSE: 202 mg/dL — AB (ref 65–99)
Potassium: 3.9 mmol/L (ref 3.5–5.1)
SODIUM: 138 mmol/L (ref 135–145)

## 2016-06-30 LAB — HEMOGLOBIN A1C: HEMOGLOBIN A1C: 6.2 % — AB (ref 4.0–6.0)

## 2016-06-30 MED ORDER — ENOXAPARIN SODIUM 40 MG/0.4ML ~~LOC~~ SOLN
40.0000 mg | Freq: Two times a day (BID) | SUBCUTANEOUS | Status: DC
  Administered 2016-06-30 (×2): 40 mg via SUBCUTANEOUS
  Filled 2016-06-30 (×2): qty 0.4

## 2016-06-30 MED ORDER — DIPHENHYDRAMINE HCL 25 MG PO CAPS: 25.0000 mg | ORAL_CAPSULE | Freq: Four times a day (QID) | ORAL | Status: DC | PRN

## 2016-06-30 MED ORDER — OXYCODONE-ACETAMINOPHEN 5-325 MG PO TABS
1.0000 | ORAL_TABLET | ORAL | Status: DC | PRN
Start: 2016-06-30 — End: 2016-07-01
  Administered 2016-06-30 – 2016-07-01 (×2): 2 via ORAL
  Filled 2016-06-30 (×2): qty 2

## 2016-06-30 MED ORDER — LORATADINE 10 MG PO TABS
10.0000 mg | ORAL_TABLET | Freq: Every day | ORAL | Status: DC
  Administered 2016-06-30 – 2016-07-01 (×2): 10 mg via ORAL
  Filled 2016-06-30 (×2): qty 1

## 2016-06-30 MED ORDER — INSULIN ASPART 100 UNIT/ML ~~LOC~~ SOLN
0.0000 [IU] | Freq: Three times a day (TID) | SUBCUTANEOUS | Status: DC
  Administered 2016-06-30 (×2): 3 [IU] via SUBCUTANEOUS
  Administered 2016-07-01: 1 [IU] via SUBCUTANEOUS
  Filled 2016-06-30: qty 1
  Filled 2016-06-30 (×2): qty 3

## 2016-06-30 MED ORDER — INSULIN ASPART 100 UNIT/ML ~~LOC~~ SOLN: 0.0000 [IU] | Freq: Every day | SUBCUTANEOUS | Status: DC

## 2016-06-30 NOTE — Clinical Documentation Improvement (Signed)
Internal Medicine  Can the diagnosis of Acute renal insufficiency be further specified?   Acute renal failure  Acute kidney injury  Acute tubular necrosis  Other  Clinically Undetermined  Supporting Information: H&P:  Hypokalemia acute renal insufficiency, continue IV fluids, get urinalysis, follow creatinine in the morning 06/29/16 progress note: Dehydration, improved with IV fluids.   Please exercise your independent, professional judgment when responding. A specific answer is not anticipated or expected. Please update your documentation within the medical record to reflect your response to this query. Thank you  Thank You,  [place] [phone]

## 2016-06-30 NOTE — Progress Notes (Signed)
Subjective:   She continues to improve following her abscess drainage. She is still quite uncomfortable but overall has less pain and is more active than preoperatively.  Vital signs in last 24 hours: Temp:  [97.6 F (36.4 C)-98.7 F (37.1 C)] 97.6 F (36.4 C) (07/10 0453) Pulse Rate:  [71-148] 71 (07/10 0453) Resp:  [17-26] 20 (07/10 0453) BP: (96-126)/(50-99) 98/83 mmHg (07/10 0453) SpO2:  [91 %-98 %] 98 % (07/10 0453) Last BM Date: 06/27/16  Intake/Output from previous day: 07/09 0701 - 07/10 0700 In: 3501 [P.O.:950; I.V.:2301; IV Piggyback:250] Out: 2550 [Urine:2550]  Exam:  Her wounds are examined. No significant abnormalities noted. Both are draining without problems.  Lab Results:  CBC  Recent Labs  06/29/16 0552 06/30/16 0525  WBC 13.6* 9.5  HGB 12.6 12.7  HCT 36.5 37.0  PLT 216 237   CMP     Component Value Date/Time   NA 138 06/30/2016 0525   NA 140 11/28/2013 2312   K 3.9 06/30/2016 0525   K 4.1 11/28/2013 2312   CL 107 06/30/2016 0525   CL 108* 11/28/2013 2312   CO2 25 06/30/2016 0525   CO2 27 11/28/2013 2312   GLUCOSE 202* 06/30/2016 0525   GLUCOSE 130* 11/28/2013 2312   BUN 13 06/30/2016 0525   BUN 29* 11/28/2013 2312   CREATININE 0.78 06/30/2016 0525   CREATININE 0.79 11/28/2013 2312   CALCIUM 8.1* 06/30/2016 0525   CALCIUM 8.7 11/28/2013 2312   PROT 6.9 06/28/2016 1435   PROT 6.9 11/28/2013 2312   ALBUMIN 3.4* 06/28/2016 1435   ALBUMIN 3.6 11/28/2013 2312   AST 14* 06/28/2016 1435   AST 24 11/28/2013 2312   ALT 14 06/28/2016 1435   ALT 21 11/28/2013 2312   ALKPHOS 76 06/28/2016 1435   ALKPHOS 65 11/28/2013 2312   BILITOT 1.2 06/28/2016 1435   BILITOT 0.3 11/28/2013 2312   GFRNONAA >60 06/30/2016 0525   GFRNONAA >60 11/28/2013 2312   GFRAA >60 06/30/2016 0525   GFRAA >60 11/28/2013 2312   PT/INR No results for input(s): LABPROT, INR in the last 72 hours.  Studies/Results: Dg Chest 1 View  06/29/2016  CLINICAL DATA:  Shortness of  breath. EXAM: CHEST 1 VIEW COMPARISON:  12/07/2015 FINDINGS: Chronic bronchitic changes are stable allowing for differences in technique. Cardiomediastinal contours are unchanged. No focal airspace disease, large pleural effusion or pneumothorax. Lung base evaluation partially obscured by soft tissue attenuation from body habitus. IMPRESSION: No active disease.  Stable chronic bronchitic change. Electronically Signed   By: Jeb Levering M.D.   On: 06/29/2016 03:39   Ct Angio Chest Pe W Or Wo Contrast  06/29/2016  CLINICAL DATA:  Acute onset of shortness of breath. Right axillary swelling and pain. Nausea and vomiting. Chills. Leukocytosis. Pus arising from the right axilla. Initial encounter. EXAM: CT ANGIOGRAPHY CHEST WITH CONTRAST TECHNIQUE: Multidetector CT imaging of the chest was performed using the standard protocol during bolus administration of intravenous contrast. Multiplanar CT image reconstructions and MIPs were obtained to evaluate the vascular anatomy. CONTRAST:  100 mL of Isovue 370 IV contrast COMPARISON:  Chest radiograph performed earlier today at 3:11 a.m., and CTA of the chest performed 06/28/2012 FINDINGS: There is no evidence of pulmonary embolus. Minimal bibasilar atelectasis is noted. The lungs are otherwise clear. There is no evidence of significant focal consolidation, pleural effusion or pneumothorax. No masses are identified; no abnormal focal contrast enhancement is seen. Vague diffuse soft tissue inflammation and phlegmon are seen tracking along the  proximal right upper arm and right axilla, with mild soft tissue inflammation tracking about the right pectoralis musculature, and associated muscle edema. There appears to be focal erosion along the anterior aspect of the right humeral head, raising suspicion for osteomyelitis. MRI of the right shoulder is recommended for further evaluation. If there is significant concern for abscess, contrast could be considered. Scattered coronary  artery calcifications are seen. No pericardial effusion is identified. No mediastinal lymphadenopathy is appreciated. The great vessels are grossly unremarkable in appearance. No axillary lymphadenopathy is seen. The visualized portions of the thyroid gland are unremarkable in appearance. The visualized portions of the liver and spleen are unremarkable. The visualized portions of the pancreas, gallbladder, stomach and adrenal glands are within normal limits. No acute osseous abnormalities are seen. Review of the MIP images confirms the above findings. IMPRESSION: 1. No evidence pulmonary embolus. 2. Minimal bibasilar atelectasis noted.  Lungs otherwise clear. 3. Vague diffuse soft tissue inflammation and phlegmon tracking along the proximal right upper arm and right axilla, with soft tissue inflammation tracking about the right pectoralis musculature, and associated muscle edema. This is compatible with diffuse soft tissue infection and likely underlying myositis. 4. Apparent focal erosion along the anterior aspect of the right humeral head, raising suspicion for osteomyelitis. MRI of the right shoulder is recommended for further evaluation. If there is significant clinical concern for abscess, contrast could be considered. 5. Scattered coronary artery calcifications seen. These results were called by telephone at the time of interpretation on 06/29/2016 at 5:37 am to Dr. Quintella Baton, who verbally acknowledged these results. Electronically Signed   By: Garald Balding M.D.   On: 06/29/2016 05:47   Mr Shoulder Right Wo Contrast  06/29/2016  CLINICAL DATA:  Right axillary draining wound. EXAM: MRI OF THE RIGHT SHOULDER WITHOUT CONTRAST TECHNIQUE: Multiplanar, multisequence MR imaging of the shoulder was performed. No intravenous contrast was administered. COMPARISON:  CT scan 06/29/2016 FINDINGS: Rotator cuff: Full-thickness retracted rotator cuff tear involving the supraspinous and subscapularis tendons. The  supraspinous tendon is retracted approximately 2.5 cm. The inferior fibers of the subscapularis tendon are intact. The super fibers are torn and retracted approximately 2 cm. Muscles: Mild edema in the infraspinatus and teres minor muscles along with fluid surrounding the muscles. Is also fluid surrounding the subscapularis muscle. Moderate fluid surrounding the deltoid muscles but no tear or myositis. Biceps long head:  Not identified.  Likely torn and retracted. Acromioclavicular Joint: Moderate AC joint degenerative changes. The acromion is type 2 in shape. Mild lateral downsloping. Glenohumeral Joint: Mild to moderate degenerative changes with small joint effusion and mild synovitis. Labrum:  Grossly intact. Bones: No acute bony findings. No marrow edema to suggest osteomyelitis. Other: Extensive edema like signal changes in the axillary region which has the appearance of an inflammatory phlegmon. I do not see a discrete drainable soft tissue abscess. IMPRESSION: 1. Large full-thickness retracted rotator cuff tear. 2. Extensive inflammatory type changes in the axilla without discrete fluid collection to suggest an abscess. 3. No definite MR findings for osteomyelitis. 4. Torn and retracted long head biceps tendon. Electronically Signed   By: Marijo Sanes M.D.   On: 06/29/2016 14:18    Assessment/Plan: She continues to improve. If we can improve her activity level she can be discharged from surgical point of view and have her wounds followed as an outpatient. She is in agreement. Subjective: Much improved  Vital signs in last 24 hours: Temp:  [97.6 F (36.4 C)-98.7 F (37.1 C)]  97.6 F (36.4 C) (07/10 0453) Pulse Rate:  [71-148] 71 (07/10 0453) Resp:  [17-26] 20 (07/10 0453) BP: (96-126)/(50-99) 98/83 mmHg (07/10 0453) SpO2:  [91 %-98 %] 98 % (07/10 0453) Last BM Date: 06/27/16  Intake/Output from previous day: 07/09 0701 - 07/10 0700 In: 3501 [P.O.:950; I.V.:2301; IV Piggyback:250] Out:  2550 [Urine:2550]  Exam:  Her wounds look good with minimal erythematous change and good drainage  Lab Results:  CBC  Recent Labs  06/29/16 0552 06/30/16 0525  WBC 13.6* 9.5  HGB 12.6 12.7  HCT 36.5 37.0  PLT 216 237   CMP     Component Value Date/Time   NA 138 06/30/2016 0525   NA 140 11/28/2013 2312   K 3.9 06/30/2016 0525   K 4.1 11/28/2013 2312   CL 107 06/30/2016 0525   CL 108* 11/28/2013 2312   CO2 25 06/30/2016 0525   CO2 27 11/28/2013 2312   GLUCOSE 202* 06/30/2016 0525   GLUCOSE 130* 11/28/2013 2312   BUN 13 06/30/2016 0525   BUN 29* 11/28/2013 2312   CREATININE 0.78 06/30/2016 0525   CREATININE 0.79 11/28/2013 2312   CALCIUM 8.1* 06/30/2016 0525   CALCIUM 8.7 11/28/2013 2312   PROT 6.9 06/28/2016 1435   PROT 6.9 11/28/2013 2312   ALBUMIN 3.4* 06/28/2016 1435   ALBUMIN 3.6 11/28/2013 2312   AST 14* 06/28/2016 1435   AST 24 11/28/2013 2312   ALT 14 06/28/2016 1435   ALT 21 11/28/2013 2312   ALKPHOS 76 06/28/2016 1435   ALKPHOS 65 11/28/2013 2312   BILITOT 1.2 06/28/2016 1435   BILITOT 0.3 11/28/2013 2312   GFRNONAA >60 06/30/2016 0525   GFRNONAA >60 11/28/2013 2312   GFRAA >60 06/30/2016 0525   GFRAA >60 11/28/2013 2312   PT/INR No results for input(s): LABPROT, INR in the last 72 hours.  Studies/Results: Dg Chest 1 View  06/29/2016  CLINICAL DATA:  Shortness of breath. EXAM: CHEST 1 VIEW COMPARISON:  12/07/2015 FINDINGS: Chronic bronchitic changes are stable allowing for differences in technique. Cardiomediastinal contours are unchanged. No focal airspace disease, large pleural effusion or pneumothorax. Lung base evaluation partially obscured by soft tissue attenuation from body habitus. IMPRESSION: No active disease.  Stable chronic bronchitic change. Electronically Signed   By: Jeb Levering M.D.   On: 06/29/2016 03:39   Ct Angio Chest Pe W Or Wo Contrast  06/29/2016  CLINICAL DATA:  Acute onset of shortness of breath. Right axillary swelling  and pain. Nausea and vomiting. Chills. Leukocytosis. Pus arising from the right axilla. Initial encounter. EXAM: CT ANGIOGRAPHY CHEST WITH CONTRAST TECHNIQUE: Multidetector CT imaging of the chest was performed using the standard protocol during bolus administration of intravenous contrast. Multiplanar CT image reconstructions and MIPs were obtained to evaluate the vascular anatomy. CONTRAST:  100 mL of Isovue 370 IV contrast COMPARISON:  Chest radiograph performed earlier today at 3:11 a.m., and CTA of the chest performed 06/28/2012 FINDINGS: There is no evidence of pulmonary embolus. Minimal bibasilar atelectasis is noted. The lungs are otherwise clear. There is no evidence of significant focal consolidation, pleural effusion or pneumothorax. No masses are identified; no abnormal focal contrast enhancement is seen. Vague diffuse soft tissue inflammation and phlegmon are seen tracking along the proximal right upper arm and right axilla, with mild soft tissue inflammation tracking about the right pectoralis musculature, and associated muscle edema. There appears to be focal erosion along the anterior aspect of the right humeral head, raising suspicion for osteomyelitis. MRI of the  right shoulder is recommended for further evaluation. If there is significant concern for abscess, contrast could be considered. Scattered coronary artery calcifications are seen. No pericardial effusion is identified. No mediastinal lymphadenopathy is appreciated. The great vessels are grossly unremarkable in appearance. No axillary lymphadenopathy is seen. The visualized portions of the thyroid gland are unremarkable in appearance. The visualized portions of the liver and spleen are unremarkable. The visualized portions of the pancreas, gallbladder, stomach and adrenal glands are within normal limits. No acute osseous abnormalities are seen. Review of the MIP images confirms the above findings. IMPRESSION: 1. No evidence pulmonary  embolus. 2. Minimal bibasilar atelectasis noted.  Lungs otherwise clear. 3. Vague diffuse soft tissue inflammation and phlegmon tracking along the proximal right upper arm and right axilla, with soft tissue inflammation tracking about the right pectoralis musculature, and associated muscle edema. This is compatible with diffuse soft tissue infection and likely underlying myositis. 4. Apparent focal erosion along the anterior aspect of the right humeral head, raising suspicion for osteomyelitis. MRI of the right shoulder is recommended for further evaluation. If there is significant clinical concern for abscess, contrast could be considered. 5. Scattered coronary artery calcifications seen. These results were called by telephone at the time of interpretation on 06/29/2016 at 5:37 am to Dr. Quintella Baton, who verbally acknowledged these results. Electronically Signed   By: Garald Balding M.D.   On: 06/29/2016 05:47   Mr Shoulder Right Wo Contrast  06/29/2016  CLINICAL DATA:  Right axillary draining wound. EXAM: MRI OF THE RIGHT SHOULDER WITHOUT CONTRAST TECHNIQUE: Multiplanar, multisequence MR imaging of the shoulder was performed. No intravenous contrast was administered. COMPARISON:  CT scan 06/29/2016 FINDINGS: Rotator cuff: Full-thickness retracted rotator cuff tear involving the supraspinous and subscapularis tendons. The supraspinous tendon is retracted approximately 2.5 cm. The inferior fibers of the subscapularis tendon are intact. The super fibers are torn and retracted approximately 2 cm. Muscles: Mild edema in the infraspinatus and teres minor muscles along with fluid surrounding the muscles. Is also fluid surrounding the subscapularis muscle. Moderate fluid surrounding the deltoid muscles but no tear or myositis. Biceps long head:  Not identified.  Likely torn and retracted. Acromioclavicular Joint: Moderate AC joint degenerative changes. The acromion is type 2 in shape. Mild lateral downsloping.  Glenohumeral Joint: Mild to moderate degenerative changes with small joint effusion and mild synovitis. Labrum:  Grossly intact. Bones: No acute bony findings. No marrow edema to suggest osteomyelitis. Other: Extensive edema like signal changes in the axillary region which has the appearance of an inflammatory phlegmon. I do not see a discrete drainable soft tissue abscess. IMPRESSION: 1. Large full-thickness retracted rotator cuff tear. 2. Extensive inflammatory type changes in the axilla without discrete fluid collection to suggest an abscess. 3. No definite MR findings for osteomyelitis. 4. Torn and retracted long head biceps tendon. Electronically Signed   By: Marijo Sanes M.D.   On: 06/29/2016 14:18    Assessment/Plan: She continues to improve.  Her progress persists she'll be discharged from a surgical point of view with follow-up in the office for wound care. She is in agreement

## 2016-06-30 NOTE — Care Management (Signed)
PT consult pending 

## 2016-06-30 NOTE — Evaluation (Signed)
Physical Therapy Evaluation Patient Details Name: Laura [name] MRN: [id number] DOB: May 16, 1951 Today's Date: 06/30/2016   History of Present Illness  Pt is a 65 yr old female presenting with significant pain in the right axilla and left buttock area, nausea/vomiting, and feeling severely dehydrated. Admitted with cellulitis with possible abscess and is s/p I&D at both sites 7/9. She became septic and consequently hypoxic and tachycardic while admitted. Of note, MRI of R shoulder revealed full thickness rotator cuff tear (supraspinatus and subscapularis) in addition to biceps LH tear/retraction. Ortho is recommending pt follow up as outpatient in 2 weeks once infection has resolved. PMH significant for COPD, HTN, DM, morbid obesity.    Clinical Impression  Prior to admission, pt was mod I versus I for functional mobility, using a single point cane for longer community distances and an Transport planner at work or for extreme community distances.  Pt's spouse helps her PRN with ADLs like dressing and bathing. Pt lives with spouse in a handicap modified 1-story home with 2-3 STE (DME, adaptations as detailed below). Currently, pt is mod I with bed mobility and sit <> stand transfers and close standby/supervision for ambulation x 67ft. Pt noted to be SOB following ambulation, vitals screened SaO2 97% HR 99 bpm. Pt would benefit from skilled acute PT to increase OOB activity and prevent further deconditioning. Once medically appropriate, I anticipate that pt will be safe to d/c home with 24/7 assist PRN from husband and DME use.      Follow Up Recommendations No PT follow up    Equipment Recommendations   (pt has equipment at home)    Recommendations for Other Services       Precautions / Restrictions Precautions Precautions: Fall Restrictions Weight Bearing Restrictions: No      Mobility  Bed Mobility Overal bed mobility: Modified Independent General bed mobility comments: With [facility]  elevated ~50 degrees pt able to assume sitting EOB without physical assist or cueing.  Transfers Overall transfer level: Modified independent Equipment used: None General transfer comment: Able to assume standing without AD or assist  Ambulation/Gait Ambulation/Gait assistance: Supervision Ambulation Distance (Feet): 40 Feet Assistive device: None Gait Pattern/deviations: WFL(Within Functional Limits) Gait velocity: Decreased General Gait Details: Supervision versus close standby assist for safety. Pt SOB following ambulation; O2 sats noted to be 97%.   Stairs    Wheelchair Mobility    Modified Rankin (Stroke Patients Only)       Balance Overall balance assessment: Needs assistance Sitting-balance support: Feet supported;Single extremity supported Sitting balance-Leahy Scale: Good Sitting balance - Comments: Pt able to withstand MMT sitting EOB with unilateral hand support on bed rail   Standing balance support: No upper extremity supported Standing balance-Leahy Scale: Good Standing balance comment: Pt demonstrates ability to ambulate 80ft with standby assist      Pertinent Vitals/Pain Pain Assessment: No/denies pain    Home Living Family/patient expects to be discharged to:: Private residence Living Arrangements: Spouse/significant other Available Help at Discharge: Family;Available 24 hours/day (spouse works from home) Type of Home: House Home Access: Stairs to enter Entrance Stairs-Rails: Left Entrance Stairs-Number of Steps: 2-3 Home Layout: One level Home Equipment: Environmental consultant - 2 wheels;Cane - single point;Shower seat;Grab bars - toilet;Grab bars - tub/shower;Electric scooter (Bed with [facility] raise/recline capability)      Prior Function Level of Independence: Independent;Independent with assistive device(s)  Comments: Ambulates household and short community distances I, ambulates further community distances mod I with cane, uses electric scooter at work or when  they go to theme parks        Extremity/Trunk Assessment   Upper Extremity Assessment: Overall WFL for tasks assessed (Did not assess RUE overhead or rotation due to injury)   Lower Extremity Assessment:  Hip Flexion: R 4/5     L 4/5 Knee Flexion: R 4/5      L 5/5 Knee Extension: R 5/5      L 5/5 Ankle Plantarflexion: R 5/5      L 5/5 Ankle Dorsiflexion: R 5/5      L 5/5 Diminished light touch sensation RLE, pt reports this is at baseline    Cervical / Trunk Assessment: Normal  Communication   Communication: No difficulties  Cognition Arousal/Alertness: Awake/alert Behavior During Therapy: WFL for tasks assessed/performed Overall Cognitive Status: Within Functional Limits for tasks assessed     General Comments General comments (skin integrity, edema, etc.): Dressing to L buttock intact with scant drainage noted.     Exercises General Exercises - Lower Extremity Ankle Circles/Pumps: AROM Short Arc Quad: AROM Heel Slides: AROM Hip ABduction/ADduction: AROM Straight Leg Raises: AROM  All exercises performed bilaterally x 10 reps in supine.      Assessment/Plan    PT Assessment Patient needs continued PT services  PT Diagnosis  (Decreased activity tolerance )   PT Problem List Decreased activity tolerance;Decreased mobility  PT Treatment Interventions Gait training;Stair training;Functional mobility training;Therapeutic activities;Therapeutic exercise;Patient/family education   PT Goals (Current goals can be found in the Care Plan section) Acute Rehab PT Goals Patient Stated Goal: To go home PT Goal Formulation: With patient Time For Goal Achievement: 07/14/16 Potential to Achieve Goals: Good    Frequency Min 2X/week   Barriers to discharge           End of Session Equipment Utilized During Treatment: Gait belt Activity Tolerance: Patient tolerated treatment well Patient left: in bed;with call bell/phone within reach;with bed alarm set         Time:  [id number] PT Time Calculation (min) (ACUTE ONLY): 27 min   Charges:         PT G Codes:        Erving Sassano, SPT 06/30/2016, 4:37 PM

## 2016-06-30 NOTE — Consult Note (Signed)
Patient was seen for evaluation of right shoulder pain. She had an abscess in the axilla drained this weekend and so minimal exam performed. 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  Impression is right shoulder rotator cuff tear with coexisting abscess in the axilla.  Plan is to have her follow-up as an outpatient in 2 weeks with Dr. Roland Rack for possible shoulder surgery, if infection is resolved

## 2016-06-30 NOTE — Progress Notes (Signed)
65 y/o F on Lovenox 40 mg daily for DVT prophylaxis. Due to weight > 100 kg and BMI > 40, will increase Lovenox dosing to 40 mg q 12 hours.   Ulice Dash, PharmD Clinical Pharmacist

## 2016-06-30 NOTE — Progress Notes (Signed)
[facility] [place] NAME: Laura [name]    MR#:  [id number]  DATE OF BIRTH:  08/12/1951  SUBJECTIVE:  CHIEF COMPLAINT:   Chief Complaint  Patient presents with  . Cellulitis   Feels much better.  Tachycardia improved. S/p  I&D yesterday. REVIEW OF SYSTEMS:  CONSTITUTIONAL: No fever,has generalized weakness.  EYES: No blurred or double vision.  EARS, NOSE, AND THROAT: No tinnitus or ear pain.  RESPIRATORY: No cough, has shortness of breath, no wheezing or hemoptysis.  CARDIOVASCULAR: No chest pain, orthopnea, edema.  GASTROINTESTINAL: No nausea, vomiting, diarrhea or abdominal pain.  GENITOURINARY: No dysuria, hematuria.  ENDOCRINE: No polyuria, nocturia,  HEMATOLOGY: No anemia, easy bruising or bleeding SKIN: No rash or lesion. MUSCULOSKELETAL: no axilla pain.    NEUROLOGIC: No tingling, numbness, weakness.  PSYCHIATRY: No anxiety or depression.   DRUG ALLERGIES:  No Known Allergies  VITALS:  Blood pressure 98/83, pulse 71, temperature 97.6 F (36.4 C), temperature source Oral, resp. rate 20, height 5' 4.5" (1.638 m), weight 302 lb (136.986 kg), SpO2 98 %.  PHYSICAL EXAMINATION:  GENERAL:  65 y.o.-year-old patient lying in the bed with no acute distress. Morbid obese. EYES: Pupils equal, round, reactive to light and accommodation. No scleral icterus. Extraocular muscles intact.  HEENT: Head atraumatic, normocephalic. Oropharynx and nasopharynx clear.  NECK:  Supple, no jugular venous distention. No thyroid enlargement, no tenderness.  LUNGS: Normal breath sounds bilaterally, no wheezing, rales,rhonchi or crepitation. No use of accessory muscles of respiration.  CARDIOVASCULAR: S1, S2 normal. No murmurs, rubs, or gallops.  ABDOMEN: Soft, nontender, nondistended. Bowel sounds present. No organomegaly or mass.  EXTREMITIES: No pedal edema, cyanosis, or clubbing. right axilla and Left buttock in dressing.  NEUROLOGIC: Cranial  nerves II through XII are intact. Muscle strength 5/5 in all extremities, she can move right arm and shoulder. Sensation intact. Gait not checked.  PSYCHIATRIC: The patient is alert and oriented x 3.  SKIN: No obvious rash, lesion, or ulcer.    LABORATORY PANEL:   CBC  Recent Labs Lab 06/30/16 0525  WBC 9.5  HGB 12.7  HCT 37.0  PLT 237   ------------------------------------------------------------------------------------------------------------------  Chemistries   Recent Labs Lab 06/28/16 1435  06/30/16 0525  NA 136  < > 138  K 3.4*  < > 3.9  CL 98*  < > 107  CO2 26  < > 25  GLUCOSE 106*  < > 202*  BUN 19  < > 13  CREATININE 1.13*  < > 0.78  CALCIUM 8.8*  < > 8.1*  MG 2.0  --   --   AST 14*  --   --   ALT 14  --   --   ALKPHOS 76  --   --   BILITOT 1.2  --   --   < > = values in this interval not displayed. ------------------------------------------------------------------------------------------------------------------  Cardiac Enzymes No results for input(s): TROPONINI in the last 168 hours. ------------------------------------------------------------------------------------------------------------------  RADIOLOGY:  Dg Chest 1 View  06/29/2016  CLINICAL DATA:  Shortness of breath. EXAM: CHEST 1 VIEW COMPARISON:  12/07/2015 FINDINGS: Chronic bronchitic changes are stable allowing for differences in technique. Cardiomediastinal contours are unchanged. No focal airspace disease, large pleural effusion or pneumothorax. Lung base evaluation partially obscured by soft tissue attenuation from body habitus. IMPRESSION: No active disease.  Stable chronic bronchitic change. Electronically Signed   By: Jeb Levering M.D.   On: 06/29/2016 03:39   Ct Angio  Chest Pe W Or Wo Contrast  06/29/2016  CLINICAL DATA:  Acute onset of shortness of breath. Right axillary swelling and pain. Nausea and vomiting. Chills. Leukocytosis. Pus arising from the right axilla. Initial encounter.  EXAM: CT ANGIOGRAPHY CHEST WITH CONTRAST TECHNIQUE: Multidetector CT imaging of the chest was performed using the standard protocol during bolus administration of intravenous contrast. Multiplanar CT image reconstructions and MIPs were obtained to evaluate the vascular anatomy. CONTRAST:  100 mL of Isovue 370 IV contrast COMPARISON:  Chest radiograph performed earlier today at 3:11 a.m., and CTA of the chest performed 06/28/2012 FINDINGS: There is no evidence of pulmonary embolus. Minimal bibasilar atelectasis is noted. The lungs are otherwise clear. There is no evidence of significant focal consolidation, pleural effusion or pneumothorax. No masses are identified; no abnormal focal contrast enhancement is seen. Vague diffuse soft tissue inflammation and phlegmon are seen tracking along the proximal right upper arm and right axilla, with mild soft tissue inflammation tracking about the right pectoralis musculature, and associated muscle edema. There appears to be focal erosion along the anterior aspect of the right humeral head, raising suspicion for osteomyelitis. MRI of the right shoulder is recommended for further evaluation. If there is significant concern for abscess, contrast could be considered. Scattered coronary artery calcifications are seen. No pericardial effusion is identified. No mediastinal lymphadenopathy is appreciated. The great vessels are grossly unremarkable in appearance. No axillary lymphadenopathy is seen. The visualized portions of the thyroid gland are unremarkable in appearance. The visualized portions of the liver and spleen are unremarkable. The visualized portions of the pancreas, gallbladder, stomach and adrenal glands are within normal limits. No acute osseous abnormalities are seen. Review of the MIP images confirms the above findings. IMPRESSION: 1. No evidence pulmonary embolus. 2. Minimal bibasilar atelectasis noted.  Lungs otherwise clear. 3. Vague diffuse soft tissue  inflammation and phlegmon tracking along the proximal right upper arm and right axilla, with soft tissue inflammation tracking about the right pectoralis musculature, and associated muscle edema. This is compatible with diffuse soft tissue infection and likely underlying myositis. 4. Apparent focal erosion along the anterior aspect of the right humeral head, raising suspicion for osteomyelitis. MRI of the right shoulder is recommended for further evaluation. If there is significant clinical concern for abscess, contrast could be considered. 5. Scattered coronary artery calcifications seen. These results were called by telephone at the time of interpretation on 06/29/2016 at 5:37 am to Dr. Quintella Baton, who verbally acknowledged these results. Electronically Signed   By: Garald Balding M.D.   On: 06/29/2016 05:47   Mr Shoulder Right Wo Contrast  06/29/2016  CLINICAL DATA:  Right axillary draining wound. EXAM: MRI OF THE RIGHT SHOULDER WITHOUT CONTRAST TECHNIQUE: Multiplanar, multisequence MR imaging of the shoulder was performed. No intravenous contrast was administered. COMPARISON:  CT scan 06/29/2016 FINDINGS: Rotator cuff: Full-thickness retracted rotator cuff tear involving the supraspinous and subscapularis tendons. The supraspinous tendon is retracted approximately 2.5 cm. The inferior fibers of the subscapularis tendon are intact. The super fibers are torn and retracted approximately 2 cm. Muscles: Mild edema in the infraspinatus and teres minor muscles along with fluid surrounding the muscles. Is also fluid surrounding the subscapularis muscle. Moderate fluid surrounding the deltoid muscles but no tear or myositis. Biceps long head:  Not identified.  Likely torn and retracted. Acromioclavicular Joint: Moderate AC joint degenerative changes. The acromion is type 2 in shape. Mild lateral downsloping. Glenohumeral Joint: Mild to moderate degenerative changes with small joint effusion and  mild synovitis. Labrum:   Grossly intact. Bones: No acute bony findings. No marrow edema to suggest osteomyelitis. Other: Extensive edema like signal changes in the axillary region which has the appearance of an inflammatory phlegmon. I do not see a discrete drainable soft tissue abscess. IMPRESSION: 1. Large full-thickness retracted rotator cuff tear. 2. Extensive inflammatory type changes in the axilla without discrete fluid collection to suggest an abscess. 3. No definite MR findings for osteomyelitis. 4. Torn and retracted long head biceps tendon. Electronically Signed   By: Marijo Sanes M.D.   On: 06/29/2016 14:18    EKG:   Orders placed or performed during the hospital encounter of 06/28/16  . ED EKG  . ED EKG  . EKG 12-Lead  . EKG 12-Lead    ASSESSMENT AND PLAN:   #1. Sepsis due to cellulitis and abscess in the right axilla and left buttock. Continue vancomycin and zosyn, NS iv. Leukocytosis improved, f/u wound cultures.  Tachycardia, due to sepsis. NS bolus, improved with NS iv and abx,  F/u echo.  #2. Cellulitis and abscess of right axilla and left buttock, no osteomyelitis (per right shoulder MRI),  Continue vancomycin and zosyn, f/u wound cultures.    * Large full-thickness retracted rotator cuff tear. Ortho consult.  #3. Hypokalemia, improved with KCl po.  #4  Dehydration, improved with IV fluids.  #5.  Hypoxia with opiates, likely due to obesity hypoventilation/questionable obstructive sleep apnea, opiate administration precautions, continue oxygen as needed. CPAP at nigtht.  #6 morbid obesity.  # COPD. Stable. xopenex prn. Continue home nebulizer.  # HTN. Hold HCTZ due to low side BP and hypokalemia.  * DM2. HbA1C 6.2. Start sliding scale.  PT evaluation. I discussed with Dr. Pat Patrick.  All the records are reviewed and case discussed with Care Management/Social Workerr. Management plans discussed with the patient, daughter and they are in agreement.  CODE STATUS: full code.  TOTAL  TIME TAKING CARE OF THIS PATIENT: 33 minutes.  Greater than 50% time was spent on coordination of care and face-to-face counseling.  POSSIBLE D/C home tomorrow, DEPENDING ON CLINICAL CONDITION.   Demetrios Loll M.D on 06/30/2016 at 12:20 PM  Between 7am to 6pm - Pager - [phone]  After 6pm go to [email or web address] - password [email or web address] [facility]  [facility]  Office  [phone]  CC: Primary care physician; Christie Nottingham., PA

## 2016-07-01 LAB — AEROBIC CULTURE W GRAM STAIN (SUPERFICIAL SPECIMEN)

## 2016-07-01 LAB — AEROBIC CULTURE  (SUPERFICIAL SPECIMEN)

## 2016-07-01 LAB — HEMOGLOBIN A1C: HEMOGLOBIN A1C: 5.8 % (ref 4.0–6.0)

## 2016-07-01 LAB — GLUCOSE, CAPILLARY: GLUCOSE-CAPILLARY: 104 mg/dL — AB (ref 65–99)

## 2016-07-01 MED ORDER — AMOXICILLIN-POT CLAVULANATE 875-125 MG PO TABS: 1.0000 | ORAL_TABLET | Freq: Two times a day (BID) | ORAL | Status: AC

## 2016-07-01 MED ORDER — CLINDAMYCIN HCL 300 MG PO CAPS: 300.0000 mg | ORAL_CAPSULE | Freq: Three times a day (TID) | ORAL | Status: AC

## 2016-07-01 MED ORDER — OXYCODONE-ACETAMINOPHEN 5-325 MG PO TABS: 1.0000 | ORAL_TABLET | ORAL | Status: DC | PRN

## 2016-07-01 NOTE — Progress Notes (Signed)
Subjective:   She is feeling better today. She does not have any significant problems except her pain. She is ambulating better and has no fever.  Vital signs in last 24 hours: Temp:  [97.3 F (36.3 C)-98 F (36.7 C)] 97.3 F (36.3 C) (07/11 0500) Pulse Rate:  [71-84] 71 (07/11 0500) Resp:  [19-20] 20 (07/11 0500) BP: (114-146)/(53-71) 146/68 mmHg (07/11 0500) SpO2:  [98 %-100 %] 98 % (07/11 0500) Last BM Date: 06/27/16  Intake/Output from previous day: 07/10 0701 - 07/11 0700 In: 2934.3 [P.O.:1780; I.V.:389.8; IV Piggyback:764.5] Out: 500 [Urine:500]  Exam:  Her wounds look good. Dressings were changed. Drains remain in place.  Lab Results:  CBC  Recent Labs  06/29/16 0552 06/30/16 0525  WBC 13.6* 9.5  HGB 12.6 12.7  HCT 36.5 37.0  PLT 216 237   CMP     Component Value Date/Time   NA 138 06/30/2016 0525   NA 140 11/28/2013 2312   K 3.9 06/30/2016 0525   K 4.1 11/28/2013 2312   CL 107 06/30/2016 0525   CL 108* 11/28/2013 2312   CO2 25 06/30/2016 0525   CO2 27 11/28/2013 2312   GLUCOSE 202* 06/30/2016 0525   GLUCOSE 130* 11/28/2013 2312   BUN 13 06/30/2016 0525   BUN 29* 11/28/2013 2312   CREATININE 0.78 06/30/2016 0525   CREATININE 0.79 11/28/2013 2312   CALCIUM 8.1* 06/30/2016 0525   CALCIUM 8.7 11/28/2013 2312   PROT 6.9 06/28/2016 1435   PROT 6.9 11/28/2013 2312   ALBUMIN 3.4* 06/28/2016 1435   ALBUMIN 3.6 11/28/2013 2312   AST 14* 06/28/2016 1435   AST 24 11/28/2013 2312   ALT 14 06/28/2016 1435   ALT 21 11/28/2013 2312   ALKPHOS 76 06/28/2016 1435   ALKPHOS 65 11/28/2013 2312   BILITOT 1.2 06/28/2016 1435   BILITOT 0.3 11/28/2013 2312   GFRNONAA >60 06/30/2016 0525   GFRNONAA >60 11/28/2013 2312   GFRAA >60 06/30/2016 0525   GFRAA >60 11/28/2013 2312   PT/INR No results for input(s): LABPROT, INR in the last 72 hours.  Studies/Results: Mr Shoulder Right Wo Contrast  06/29/2016  CLINICAL DATA:  Right axillary draining wound. EXAM: MRI OF  THE RIGHT SHOULDER WITHOUT CONTRAST TECHNIQUE: Multiplanar, multisequence MR imaging of the shoulder was performed. No intravenous contrast was administered. COMPARISON:  CT scan 06/29/2016 FINDINGS: Rotator cuff: Full-thickness retracted rotator cuff tear involving the supraspinous and subscapularis tendons. The supraspinous tendon is retracted approximately 2.5 cm. The inferior fibers of the subscapularis tendon are intact. The super fibers are torn and retracted approximately 2 cm. Muscles: Mild edema in the infraspinatus and teres minor muscles along with fluid surrounding the muscles. Is also fluid surrounding the subscapularis muscle. Moderate fluid surrounding the deltoid muscles but no tear or myositis. Biceps long head:  Not identified.  Likely torn and retracted. Acromioclavicular Joint: Moderate AC joint degenerative changes. The acromion is type 2 in shape. Mild lateral downsloping. Glenohumeral Joint: Mild to moderate degenerative changes with small joint effusion and mild synovitis. Labrum:  Grossly intact. Bones: No acute bony findings. No marrow edema to suggest osteomyelitis. Other: Extensive edema like signal changes in the axillary region which has the appearance of an inflammatory phlegmon. I do not see a discrete drainable soft tissue abscess. IMPRESSION: 1. Large full-thickness retracted rotator cuff tear. 2. Extensive inflammatory type changes in the axilla without discrete fluid collection to suggest an abscess. 3. No definite MR findings for osteomyelitis. 4. Torn and retracted long  head biceps tendon. Electronically Signed   By: Marijo Sanes M.D.   On: 06/29/2016 14:18    Assessment/Plan: She can be discharged from a surgical point of view with follow-up in our office later this week for drain removal. Drain care was discussed with the patient.

## 2016-07-01 NOTE — Anesthesia Postprocedure Evaluation (Signed)
Anesthesia Post Note  Patient: Laura [name]  Procedure(s) Performed: Procedure(s) (LRB): INCISION AND DRAINAGE ABSCESS (Right) IRRIGATION AND DEBRIDEMENT WOUND (Left)  Patient location during evaluation: PACU Anesthesia Type: General Level of consciousness: awake and alert and oriented Pain management: pain level controlled Vital Signs Assessment: post-procedure vital signs reviewed and stable Respiratory status: spontaneous breathing Cardiovascular status: blood pressure returned to baseline Anesthetic complications: no    Last Vitals:  Filed Vitals:   06/30/16 2052 07/01/16 0500  BP: 114/53 146/68  Pulse: 78 71  Temp: 36.7 C 36.3 C  Resp: 20 20    Last Pain:  Filed Vitals:   07/01/16 0832  PainSc: 9                  Tyus Kallam

## 2016-07-01 NOTE — Progress Notes (Signed)
Patient discharged to home as ordered, follow up appointments given to patient as ordered. Patient to follow up with Dr. Adonis Huguenin on 07-07-2016 at 2:45 pm. Patient dressing changed today by Dr. Pat Patrick and dressing upon discharge is clean dry and intact. Patient taken home by her husband and is ambulatory. No acute distress noted.

## 2016-07-01 NOTE — Progress Notes (Signed)
[place] at [place] was admitted to the Hospital on 06/28/2016 and Discharged  07/01/2016 and should be excused from work/school   For 6 days starting 06/28/2016 , may return to work/school without any restrictions.  Call Dustin Flock MD with questions.  Dustin Flock M.D on 07/01/2016,at 10:40 AM  [place] [facility]  [phone]

## 2016-07-01 NOTE — Discharge Instructions (Signed)
°  DIET:  Cardiac diet, diabetic diet  DISCHARGE CONDITION:  Stable  ACTIVITY:  Activity as tolerated  OXYGEN:  Home Oxygen: No.   Oxygen Delivery: room air  DISCHARGE LOCATION:  home    ADDITIONAL DISCHARGE INSTRUCTION: please take over the counter probiotics when on antibiotics Also drain care as described per Dr. Pat Patrick   If you experience worsening of your admission symptoms, develop shortness of breath, life threatening emergency, suicidal or homicidal thoughts you must seek medical attention immediately by calling 911 or calling your MD immediately  if symptoms less severe.  You Must read complete instructions/literature along with all the possible adverse reactions/side effects for all the Medicines you take and that have been prescribed to you. Take any new Medicines after you have completely understood and accpet all the possible adverse reactions/side effects.   Please note  You were cared for by a hospitalist during your hospital stay. If you have any questions about your discharge medications or the care you received while you were in the hospital after you are discharged, you can call the unit and asked to speak with the hospitalist on call if the hospitalist that took care of you is not available. Once you are discharged, your primary care physician will handle any further medical issues. Please note that NO REFILLS for any discharge medications will be authorized once you are discharged, as it is imperative that you return to your primary care physician (or establish a relationship with a primary care physician if you do not have one) for your aftercare needs so that they can reassess your need for medications and monitor your lab values.

## 2016-07-01 NOTE — Discharge Summary (Signed)
Laura [name], 65 y.o., DOB Jun 29, 1951, MRN [id number]. Admission date: 06/28/2016 Discharge Date 07/01/2016 Primary MD Christie Nottingham., PA Admitting Physician Theodoro Grist, MD  Admission Diagnosis  Cellulitis of buttock [L03.317] Hypoxia [R09.02] Cellulitis of right axilla [L03.111]  Discharge Diagnosis   Principal Problem:   Sepsis ([place])   Cellulitis and abscess   Hypokalemia   Acute renal insufficiency   Hyperglycemia   Leukocytosis   Hypoxia   Cellulitis of buttock   Cellulitis and abscess of right axilla    Right rotator cuff tear        Hospital Course  Patient is a 65 year old with history of COPD, hypertension who presented to the hospital with having pain in the right axilla and left buttocks region. She was noted to have cellulitis and subsequently abscess. She was seen by surgery. She was started on IV antibiotics. Surgical consult was obtained. They did incision and drainage of the abscess. Patient also was complaining of pain in the right shoulder therefore had a MRI which showed rotator cuff injury there was no evidence of other infection in her shoulder joint. For which she was seen by orthopedics. Patient is currently afebrile and is doing well. She will follow up outpatient with orthopedics as well as general surgery because she still has a drain in place for the abscess in the axilla.           Consults  general surgery, orthopedic surgery  Significant Tests:  See full reports for all details      Dg Chest 1 View  06/29/2016  CLINICAL DATA:  Shortness of breath. EXAM: CHEST 1 VIEW COMPARISON:  12/07/2015 FINDINGS: Chronic bronchitic changes are stable allowing for differences in technique. Cardiomediastinal contours are unchanged. No focal airspace disease, large pleural effusion or pneumothorax. Lung base evaluation partially obscured by soft tissue attenuation from body habitus. IMPRESSION: No active disease.  Stable chronic bronchitic change.  Electronically Signed   By: Jeb Levering M.D.   On: 06/29/2016 03:39   Ct Angio Chest Pe W Or Wo Contrast  06/29/2016  CLINICAL DATA:  Acute onset of shortness of breath. Right axillary swelling and pain. Nausea and vomiting. Chills. Leukocytosis. Pus arising from the right axilla. Initial encounter. EXAM: CT ANGIOGRAPHY CHEST WITH CONTRAST TECHNIQUE: Multidetector CT imaging of the chest was performed using the standard protocol during bolus administration of intravenous contrast. Multiplanar CT image reconstructions and MIPs were obtained to evaluate the vascular anatomy. CONTRAST:  100 mL of Isovue 370 IV contrast COMPARISON:  Chest radiograph performed earlier today at 3:11 a.m., and CTA of the chest performed 06/28/2012 FINDINGS: There is no evidence of pulmonary embolus. Minimal bibasilar atelectasis is noted. The lungs are otherwise clear. There is no evidence of significant focal consolidation, pleural effusion or pneumothorax. No masses are identified; no abnormal focal contrast enhancement is seen. Vague diffuse soft tissue inflammation and phlegmon are seen tracking along the proximal right upper arm and right axilla, with mild soft tissue inflammation tracking about the right pectoralis musculature, and associated muscle edema. There appears to be focal erosion along the anterior aspect of the right humeral head, raising suspicion for osteomyelitis. MRI of the right shoulder is recommended for further evaluation. If there is significant concern for abscess, contrast could be considered. Scattered coronary artery calcifications are seen. No pericardial effusion is identified. No mediastinal lymphadenopathy is appreciated. The great vessels are grossly unremarkable in appearance. No axillary lymphadenopathy is seen. The visualized portions of the thyroid gland are unremarkable  in appearance. The visualized portions of the liver and spleen are unremarkable. The visualized portions of the pancreas,  gallbladder, stomach and adrenal glands are within normal limits. No acute osseous abnormalities are seen. Review of the MIP images confirms the above findings. IMPRESSION: 1. No evidence pulmonary embolus. 2. Minimal bibasilar atelectasis noted.  Lungs otherwise clear. 3. Vague diffuse soft tissue inflammation and phlegmon tracking along the proximal right upper arm and right axilla, with soft tissue inflammation tracking about the right pectoralis musculature, and associated muscle edema. This is compatible with diffuse soft tissue infection and likely underlying myositis. 4. Apparent focal erosion along the anterior aspect of the right humeral head, raising suspicion for osteomyelitis. MRI of the right shoulder is recommended for further evaluation. If there is significant clinical concern for abscess, contrast could be considered. 5. Scattered coronary artery calcifications seen. These results were called by telephone at the time of interpretation on 06/29/2016 at 5:37 am to Dr. Quintella Baton, who verbally acknowledged these results. Electronically Signed   By: Garald Balding M.D.   On: 06/29/2016 05:47   Mr Shoulder Right Wo Contrast  06/29/2016  CLINICAL DATA:  Right axillary draining wound. EXAM: MRI OF THE RIGHT SHOULDER WITHOUT CONTRAST TECHNIQUE: Multiplanar, multisequence MR imaging of the shoulder was performed. No intravenous contrast was administered. COMPARISON:  CT scan 06/29/2016 FINDINGS: Rotator cuff: Full-thickness retracted rotator cuff tear involving the supraspinous and subscapularis tendons. The supraspinous tendon is retracted approximately 2.5 cm. The inferior fibers of the subscapularis tendon are intact. The super fibers are torn and retracted approximately 2 cm. Muscles: Mild edema in the infraspinatus and teres minor muscles along with fluid surrounding the muscles. Is also fluid surrounding the subscapularis muscle. Moderate fluid surrounding the deltoid muscles but no tear or  myositis. Biceps long head:  Not identified.  Likely torn and retracted. Acromioclavicular Joint: Moderate AC joint degenerative changes. The acromion is type 2 in shape. Mild lateral downsloping. Glenohumeral Joint: Mild to moderate degenerative changes with small joint effusion and mild synovitis. Labrum:  Grossly intact. Bones: No acute bony findings. No marrow edema to suggest osteomyelitis. Other: Extensive edema like signal changes in the axillary region which has the appearance of an inflammatory phlegmon. I do not see a discrete drainable soft tissue abscess. IMPRESSION: 1. Large full-thickness retracted rotator cuff tear. 2. Extensive inflammatory type changes in the axilla without discrete fluid collection to suggest an abscess. 3. No definite MR findings for osteomyelitis. 4. Torn and retracted long head biceps tendon. Electronically Signed   By: Marijo Sanes M.D.   On: 06/29/2016 14:18       Today   Subjective:   Henesis Mullins  feeling better denies any significant complaints was to go home  Objective:   Blood pressure 146/68, pulse 71, temperature 97.3 F (36.3 C), temperature source Oral, resp. rate 20, height 5' 4.5" (1.638 m), weight 136.986 kg (302 lb), SpO2 98 %.  .  Intake/Output Summary (Last 24 hours) at 07/01/16 1300 Last data filed at 07/01/16 0900  Gross per 24 hour  Intake   1460 ml  Output    400 ml  Net   1060 ml    Exam VITAL SIGNS: Blood pressure 146/68, pulse 71, temperature 97.3 F (36.3 C), temperature source Oral, resp. rate 20, height 5' 4.5" (1.638 m), weight 136.986 kg (302 lb), SpO2 98 %.  GENERAL:  65 y.o.-year-old patient lying in the bed with no acute distress.  EYES: Pupils equal, round, reactive to  light and accommodation. No scleral icterus. Extraocular muscles intact.  HEENT: Head atraumatic, normocephalic. Oropharynx and nasopharynx clear.  NECK:  Supple, no jugular venous distention. No thyroid enlargement, no tenderness.  LUNGS: Normal  breath sounds bilaterally, no wheezing, rales,rhonchi or crepitation. No use of accessory muscles of respiration.  CARDIOVASCULAR: S1, S2 normal. No murmurs, rubs, or gallops.  ABDOMEN: Soft, nontender, nondistended. Bowel sounds present. No organomegaly or mass.  EXTREMITIES: No pedal edema, cyanosis, or clubbing.  NEUROLOGIC: Cranial nerves II through XII are intact. Muscle strength 5/5 in all extremities. Sensation intact. Gait not checked.  PSYCHIATRIC: The patient is alert and oriented x 3.  SKIN: No obvious rash, lesion, Has a right axilla and left buttocks dressing in place  Data Review     CBC w Diff: Lab Results  Component Value Date   WBC 9.5 06/30/2016   WBC 9.5 11/28/2013   HGB 12.7 06/30/2016   HGB 13.0 11/28/2013   HCT 37.0 06/30/2016   HCT 39.2 11/28/2013   PLT 237 06/30/2016   PLT 353 12/02/2013   LYMPHOPCT 12 06/27/2016   MONOPCT 8 06/27/2016   EOSPCT 3 06/27/2016   BASOPCT 1 06/27/2016   CMP: Lab Results  Component Value Date   NA 138 06/30/2016   NA 140 11/28/2013   K 3.9 06/30/2016   K 4.1 11/28/2013   CL 107 06/30/2016   CL 108* 11/28/2013   CO2 25 06/30/2016   CO2 27 11/28/2013   BUN 13 06/30/2016   BUN 29* 11/28/2013   CREATININE 0.78 06/30/2016   CREATININE 0.79 11/28/2013   PROT 6.9 06/28/2016   PROT 6.9 11/28/2013   ALBUMIN 3.4* 06/28/2016   ALBUMIN 3.6 11/28/2013   BILITOT 1.2 06/28/2016   BILITOT 0.3 11/28/2013   ALKPHOS 76 06/28/2016   ALKPHOS 65 11/28/2013   AST 14* 06/28/2016   AST 24 11/28/2013   ALT 14 06/28/2016   ALT 21 11/28/2013  .  Micro Results Recent Results (from the past 240 hour(s))  Aerobic Culture (superficial specimen)     Status: None   Collection Time: 06/28/16  8:48 PM  Result Value Ref Range Status   Specimen Description AXILLA RIGHT ABSCESS  Final   Special Requests NONE  Final   Gram Stain   Final    MODERATE WBC PRESENT, PREDOMINANTLY PMN FEW GRAM POSITIVE COCCI IN CLUSTERS Performed at [facility]    Culture   Final    ABUNDANT METHICILLIN RESISTANT STAPHYLOCOCCUS AUREUS   Report Status 07/01/2016 FINAL  Final   Organism ID, Bacteria METHICILLIN RESISTANT STAPHYLOCOCCUS AUREUS  Final      Susceptibility   Methicillin resistant staphylococcus aureus - MIC*    CIPROFLOXACIN <=0.5 SENSITIVE Sensitive     ERYTHROMYCIN >=8 RESISTANT Resistant     GENTAMICIN <=0.5 SENSITIVE Sensitive     OXACILLIN >=4 RESISTANT Resistant     TETRACYCLINE <=1 SENSITIVE Sensitive     VANCOMYCIN 1 SENSITIVE Sensitive     TRIMETH/SULFA <=10 SENSITIVE Sensitive     CLINDAMYCIN <=0.25 SENSITIVE Sensitive     RIFAMPIN <=0.5 SENSITIVE Sensitive     Inducible Clindamycin NEGATIVE Sensitive     * ABUNDANT METHICILLIN RESISTANT STAPHYLOCOCCUS AUREUS  Aerobic/Anaerobic Culture (surgical/deep wound)     Status: None (Preliminary result)   Collection Time: 06/29/16  2:58 PM  Result Value Ref Range Status   Specimen Description ABSCESS  Final   Special Requests   Final    LEFT BUTTOCKS PATIENT ON FOLLOWING ZOSYN,VANCOMYCIN  Gram Stain   Final    RARE WBC PRESENT, PREDOMINANTLY PMN NO ORGANISMS SEEN    Culture   Final    NO GROWTH 2 DAYS Performed at [facility]    Report Status PENDING  Incomplete  Aerobic/Anaerobic Culture (surgical/deep wound)     Status: None (Preliminary result)   Collection Time: 06/29/16  3:03 PM  Result Value Ref Range Status   Specimen Description ABSCESS  Final   Special Requests   Final    RIGHT AXILLARY PATIENT ON FOLLOWING ZOSYN,VANCOMYCIN   Gram Stain   Final    FEW WBC PRESENT, PREDOMINANTLY PMN RARE SQUAMOUS EPITHELIAL CELLS PRESENT FEW GRAM POSITIVE COCCI IN CLUSTERS RARE GRAM NEGATIVE RODS Performed at [facility]    Culture   Final    MODERATE STAPHYLOCOCCUS AUREUS SUSCEPTIBILITIES PERFORMED ON PREVIOUS CULTURE WITHIN THE LAST 5 DAYS. NO ANAEROBES ISOLATED; CULTURE IN PROGRESS FOR 5 DAYS    Report Status PENDING  Incomplete         Code Status Orders        Start     Ordered   06/28/16 2112  Full code   Continuous     06/28/16 2112    Code Status History    Date Active Date Inactive Code Status Order ID Comments User Context   This patient has a current code status but no historical code status.          Follow-up Information    Follow up with Corky Mull, MD.   Specialty:  Surgery   Why:  July 14, 2016 at 9:00am. Please arrive 15 minutes early and bring current list of medications.    Contact information:   [place] [address] [phone]       Follow up with Clayburn Pert, MD. Go on 07/07/2016.   Specialty:  General Surgery   Why:  Monday at 2:45pm for hospital follow-up   Contact information:   [address] [place] [address] [phone]       Follow up with Christie Nottingham., PA. Go on 07/11/2016.   Specialty:  Physician Assistant   Why:  Friday ay 9:45am for hospital follow-up   Contact information:   [place]  [address] [phone]       Discharge Medications     Medication List    STOP taking these medications        sulfamethoxazole-trimethoprim 800-160 MG tablet  Commonly known as:  BACTRIM DS,SEPTRA DS      TAKE these medications        amoxicillin-clavulanate 875-125 MG tablet  Commonly known as:  AUGMENTIN  Take 1 tablet by mouth 2 (two) times daily.     BREO ELLIPTA 200-25 MCG/INH Aepb  Generic drug:  fluticasone furoate-vilanterol  Inhale 1 puff into the lungs daily.     clindamycin 300 MG capsule  Commonly known as:  CLEOCIN  Take 1 capsule (300 mg total) by mouth 3 (three) times daily.     COMBIVENT RESPIMAT 20-100 MCG/ACT Aers respimat  Generic drug:  Ipratropium-Albuterol  Inhale 1 puff into the lungs 4 (four) times daily as needed. For shortness of breath/wheezing.     DEXILANT 60 MG capsule  Generic drug:  dexlansoprazole  Take 60 mg by mouth daily.      hydrochlorothiazide 25 MG tablet  Commonly known as:  HYDRODIURIL  Take 25 mg by mouth daily.  levalbuterol 0.31 MG/3ML nebulizer solution  Commonly known as:  XOPENEX  Take 3 mLs by nebulization every 4 (four) hours as needed. For wheezing/ and or shortness of breath.     montelukast 10 MG tablet  Commonly known as:  SINGULAIR  Take 10 mg by mouth daily.     oxyCODONE-acetaminophen 5-325 MG tablet  Commonly known as:  ROXICET  Take 1-2 tablets by mouth every 4 (four) hours as needed for severe pain.     phentermine 37.5 MG tablet  Commonly known as:  ADIPEX-P  Take 37.5 mg by mouth daily.     predniSONE 10 MG tablet  Commonly known as:  DELTASONE  Take 5-10 mg by mouth See admin instructions. Take 1/2 tablet (5 mg) by mouth every other day alternating with 1 tablet every other day.     SPIRIVA RESPIMAT 1.25 MCG/ACT Aers  Generic drug:  Tiotropium Bromide Monohydrate  Inhale 1 puff into the lungs daily.     THEO-24 100 MG 24 hr capsule  Generic drug:  theophylline  Take 100 mg by mouth daily.           Total Time in preparing paper work, data evaluation and todays exam - 35 minutes  Dustin Flock M.D on 07/01/2016 at 1:00 [facility]  [place]  [phone]

## 2016-07-02 ENCOUNTER — Telehealth: Payer: Self-pay | Admitting: General Surgery

## 2016-07-02 NOTE — Telephone Encounter (Signed)
Drain on left buttocks is overflowing and bandage is wet and loose. She is a little uncomfortable. I & D 06/29/16 Dr. Burt Knack

## 2016-07-02 NOTE — Telephone Encounter (Signed)
Patient states her bandages were wet from the drainage. She denies fever,pain, or unusual swelling or odor. She states no no one told her to change the bandages daily. We discussed wound care and she was instructed to change bandages daily and to look for any signs or symptoms of infection and to let us know if she notices anything. She was told we would see her on her appointment with Dr.Woodham on 07/07/16. Patient verbalized understanding.

## 2016-07-04 ENCOUNTER — Other Ambulatory Visit: Payer: Self-pay

## 2016-07-05 LAB — AEROBIC/ANAEROBIC CULTURE W GRAM STAIN (SURGICAL/DEEP WOUND): Culture: NO GROWTH

## 2016-07-05 LAB — AEROBIC/ANAEROBIC CULTURE (SURGICAL/DEEP WOUND)

## 2016-07-06 LAB — AEROBIC/ANAEROBIC CULTURE (SURGICAL/DEEP WOUND)

## 2016-07-07 ENCOUNTER — Encounter: Payer: Self-pay | Admitting: General Surgery

## 2016-07-07 ENCOUNTER — Ambulatory Visit (INDEPENDENT_AMBULATORY_CARE_PROVIDER_SITE_OTHER): Payer: 59 | Admitting: General Surgery

## 2016-07-07 VITALS — Temp 98.2°F | Ht 64.0 in | Wt 290.0 lb

## 2016-07-07 DIAGNOSIS — Z4889 Encounter for other specified surgical aftercare: Secondary | ICD-10-CM

## 2016-07-07 NOTE — Progress Notes (Signed)
Outpatient Surgical Follow Up  07/07/2016  Laura [name] is an 65 y.o. female.   Chief Complaint  Patient presents with  . Routine Post Op    I&D Abscess 06/29/2016 Dr. Burt Knack    HPI: 44 showed female returns to clinic for follow-up after incision and drainage of a right axillary and left perirectal abscess. Patient reports feeling much better than before drainage. Continues to have some drain output that it is becoming clear though. She's been eating well and denies any fevers, chills, nausea, vomiting, chest pain, shortness of breath, diarrhea, constipation.  Past Medical History  Diagnosis Date  . Asthma   . Hypertension     Past Surgical History  Procedure Laterality Date  . Incision and drainage abscess Right 06/29/2016    Procedure: INCISION AND DRAINAGE ABSCESS;  Surgeon: Florene Glen, MD;  Location: [facility] ORS;  Service: General;  Laterality: Right;  . Incision and drainage of wound Left 06/29/2016    Procedure: IRRIGATION AND DEBRIDEMENT WOUND;  Surgeon: Florene Glen, MD;  Location: [facility] ORS;  Service: General;  Laterality: Left;    Family History  Problem Relation Age of Onset  . Dementia Mother   . Osteoporosis Mother   . Vascular Disease Mother   . COPD Father     Social History:  reports that she has never smoked. She does not have any smokeless tobacco history on file. Her alcohol and drug histories are not on file.  Allergies: No Known Allergies  Medications reviewed.    ROS  A multipoint review of systems was completed, all pertinent positives and negatives are documented within the history of present illness and the remainder are negative.  Temp(Src) 98.2 F (36.8 C) (Oral)  Ht 5\' 4"  (1.626 m)  Wt 131.543 kg (290 lb)  BMI 49.75 kg/m2  Physical Exam Gen.: No acute distress Chest: Clear to auscultation Heart: Regular rhythm Abdomen: Soft and nontender Skin: Penrose drains present in the right axilla and left perirectal region. No spreading  erythema or purulent drainage noted.    No results found for this or any previous visit (from the past 48 hour(s)). No results found.  Assessment/Plan:  1. Aftercare following surgery 65 year old female one week status post drainage of right axillary and left perirectal abscess. Drains removed in clinic today with ease. Discussed with the patient there be continued drainage until the skin heals. She'll follow-up in clinic in 1 week to see her operating surgeon to ensure continued healing.     Clayburn Pert, MD FACS General Surgeon  07/07/2016,3:06 PM

## 2016-07-07 NOTE — Patient Instructions (Signed)
Please finish taking your antibiotics.  Please give [place] a call if you have any questions or concerns.

## 2016-07-09 ENCOUNTER — Other Ambulatory Visit: Payer: Self-pay

## 2016-07-14 ENCOUNTER — Ambulatory Visit (INDEPENDENT_AMBULATORY_CARE_PROVIDER_SITE_OTHER): Payer: 59 | Admitting: Surgery

## 2016-07-14 ENCOUNTER — Encounter: Payer: Self-pay | Admitting: Surgery

## 2016-07-14 VITALS — BP 163/80 | HR 75 | Temp 98.2°F | Wt 296.0 lb

## 2016-07-14 DIAGNOSIS — L02219 Cutaneous abscess of trunk, unspecified: Secondary | ICD-10-CM

## 2016-07-14 DIAGNOSIS — L03319 Cellulitis of trunk, unspecified: Secondary | ICD-10-CM

## 2016-07-14 NOTE — Progress Notes (Signed)
Outpatient postop visit  07/14/2016  Laura [name] is an 65 y.o. female.    Procedure: Incision and drainage of right axillary abscess and left buttock abscess  CC: Minimal drainage  HPI: Patient feels much better no fevers no chills she's having minimal serous drainage she does have some dizziness today and has an appointment to see her primary care physician.  Medications reviewed.    Physical Exam:  BP (!) 163/80 (BP Location: Right Arm, Cuff Size: Large)   Pulse 75   Temp 98.2 F (36.8 C) (Oral)   Wt 296 lb (134.3 kg)   BMI 50.81 kg/m     PE: Morbidly obese female in no acute distress. Right axillary wound is healing and almost completely closed with minimal serous drainage Left buttock wound is draining minimal serous drainage no erythema no tenderness.    Assessment/Plan:  Patient doing very well recommend follow up on an as-needed basis her wounds are nearly completely healed at this point. I did remind her that dizziness etc. could be a sign of a serious problem and that with her other medical problems she needs to see her primary care physician which she agreed to do.  Florene Glen, MD, FACS

## 2016-09-05 DIAGNOSIS — J301 Allergic rhinitis due to pollen: Secondary | ICD-10-CM | POA: Diagnosis not present

## 2016-09-05 DIAGNOSIS — J4550 Severe persistent asthma, uncomplicated: Secondary | ICD-10-CM | POA: Diagnosis not present

## 2016-09-05 DIAGNOSIS — Z6841 Body Mass Index (BMI) 40.0 and over, adult: Secondary | ICD-10-CM | POA: Diagnosis not present

## 2016-09-21 DIAGNOSIS — I482 Chronic atrial fibrillation, unspecified: Secondary | ICD-10-CM

## 2016-09-21 HISTORY — DX: Chronic atrial fibrillation, unspecified: I48.20

## 2016-10-01 DIAGNOSIS — I10 Essential (primary) hypertension: Secondary | ICD-10-CM | POA: Diagnosis not present

## 2016-10-01 DIAGNOSIS — R0602 Shortness of breath: Secondary | ICD-10-CM | POA: Diagnosis not present

## 2016-10-01 DIAGNOSIS — I4891 Unspecified atrial fibrillation: Secondary | ICD-10-CM | POA: Diagnosis not present

## 2016-10-01 DIAGNOSIS — I208 Other forms of angina pectoris: Secondary | ICD-10-CM | POA: Diagnosis not present

## 2016-10-01 DIAGNOSIS — K52831 Collagenous colitis: Secondary | ICD-10-CM | POA: Diagnosis not present

## 2016-10-08 DIAGNOSIS — I4891 Unspecified atrial fibrillation: Secondary | ICD-10-CM | POA: Diagnosis not present

## 2016-10-23 DIAGNOSIS — R0602 Shortness of breath: Secondary | ICD-10-CM | POA: Diagnosis not present

## 2016-10-23 DIAGNOSIS — I480 Paroxysmal atrial fibrillation: Secondary | ICD-10-CM | POA: Diagnosis not present

## 2016-10-23 DIAGNOSIS — I10 Essential (primary) hypertension: Secondary | ICD-10-CM | POA: Diagnosis not present

## 2016-10-27 ENCOUNTER — Other Ambulatory Visit: Payer: Self-pay | Admitting: Physician Assistant

## 2016-10-27 ENCOUNTER — Ambulatory Visit
Admission: RE | Admit: 2016-10-27 | Discharge: 2016-10-27 | Disposition: A | Discharge status: Home / Self Care | Payer: 59 | Source: Ambulatory Visit | Attending: Physician Assistant | Admitting: Physician Assistant

## 2016-10-27 DIAGNOSIS — M79661 Pain in right lower leg: Secondary | ICD-10-CM

## 2016-10-27 DIAGNOSIS — M7989 Other specified soft tissue disorders: Principal | ICD-10-CM

## 2016-10-27 DIAGNOSIS — I8001 Phlebitis and thrombophlebitis of superficial vessels of right lower extremity: Secondary | ICD-10-CM | POA: Insufficient documentation

## 2016-10-30 DIAGNOSIS — Z5181 Encounter for therapeutic drug level monitoring: Secondary | ICD-10-CM | POA: Diagnosis not present

## 2016-10-30 DIAGNOSIS — I480 Paroxysmal atrial fibrillation: Secondary | ICD-10-CM | POA: Diagnosis not present

## 2016-11-17 ENCOUNTER — Ambulatory Visit (INDEPENDENT_AMBULATORY_CARE_PROVIDER_SITE_OTHER): Payer: 59 | Admitting: Vascular Surgery

## 2016-11-17 ENCOUNTER — Encounter (INDEPENDENT_AMBULATORY_CARE_PROVIDER_SITE_OTHER): Payer: Self-pay | Admitting: Vascular Surgery

## 2016-11-17 DIAGNOSIS — M79604 Pain in right leg: Secondary | ICD-10-CM

## 2016-11-17 DIAGNOSIS — M7989 Other specified soft tissue disorders: Secondary | ICD-10-CM

## 2016-11-17 DIAGNOSIS — I872 Venous insufficiency (chronic) (peripheral): Secondary | ICD-10-CM

## 2016-11-17 DIAGNOSIS — I8001 Phlebitis and thrombophlebitis of superficial vessels of right lower extremity: Secondary | ICD-10-CM | POA: Insufficient documentation

## 2016-11-17 DIAGNOSIS — M79609 Pain in unspecified limb: Secondary | ICD-10-CM | POA: Insufficient documentation

## 2016-11-27 DIAGNOSIS — Z79899 Other long term (current) drug therapy: Secondary | ICD-10-CM | POA: Diagnosis not present

## 2016-11-27 DIAGNOSIS — Z5181 Encounter for therapeutic drug level monitoring: Secondary | ICD-10-CM | POA: Diagnosis not present

## 2016-11-28 NOTE — Progress Notes (Signed)
MRN : [id number]  Laura [name] is a 65 y.o. ([birth year]) female who presents with chief complaint of  Chief Complaint  Patient presents with  . New Evaluation    DVT right leg  .  History of Present Illness: Patient is seen for evaluation of leg pain and leg swelling. The patient first noticed the swelling a week or two ago. The swelling is associated with pain and intense heat with red discoloration. The pain and swelling worsens with prolonged dependency and improves with elevation. The pain is unrelated to activity.  The patient notes that in the morning the legs are significantly improved but they steadily worsened throughout the course of the day. The patient also notes a steady worsening of the discoloration in the ankle and shin area.   The patient denies claudication symptoms.  The patient denies symptoms consistent with rest pain.  The patient denies and extensive history of DJD and LS spine disease.  The patient has no had any past angiography, interventions or vascular surgery.  Elevation makes the leg symptoms better, dependency makes them much worse. There is no history of ulcerations. The patient denies any recent changes in medications.  The patient has not been wearing graduated compression.  The patient denies a history of DVT or PE. There is no prior history of phlebitis. There is no history of primary lymphedema.  No history of malignancies. No history of trauma or groin or pelvic surgery. There is no history of radiation treatment to the groin or pelvis  The patient denies amaurosis fugax or recent TIA symptoms. There are no recent neurological changes noted. The patient denies recent episodes of angina or shortness of breath  Current Meds  Medication Sig  . BREO ELLIPTA 200-25 MCG/INH AEPB Inhale 1 puff into the lungs daily.  Marland Kitchen dexlansoprazole (DEXILANT) 60 MG capsule Take 60 mg by mouth daily.   Marland Kitchen guaiFENesin (MUCINEX) 600 MG 12 hr tablet Take 600 mg by  mouth 2 (two) times daily as needed.  . hydrochlorothiazide (HYDRODIURIL) 25 MG tablet Take 25 mg by mouth daily.  . Ipratropium-Albuterol (COMBIVENT RESPIMAT) 20-100 MCG/ACT AERS respimat Inhale 1 puff into the lungs 4 (four) times daily as needed. For shortness of breath/wheezing.  . levalbuterol (XOPENEX) 0.31 MG/3ML nebulizer solution Take 3 mLs by nebulization every 4 (four) hours as needed. For wheezing/ and or shortness of breath.  . montelukast (SINGULAIR) 10 MG tablet Take 10 mg by mouth daily.  . Multiple Vitamin (MULTIVITAMIN) tablet Take 1 tablet by mouth daily.  Marland Kitchen nystatin (MYCOSTATIN) 100000 UNIT/ML suspension SWISH AND SWALLOW 1 TEASPOONFUL BY MOUTH four times a day for 5 days then if needed  . phentermine (ADIPEX-P) 37.5 MG tablet Take 37.5 mg by mouth daily.  . predniSONE (DELTASONE) 10 MG tablet Take 5-10 mg by mouth See admin instructions. Take 1/2 tablet (5 mg) by mouth every other day alternating with 1 tablet every other day.  . Probiotic Product (PROBIOTIC-10) CAPS Take 1 capsule by mouth 1 day or 1 dose.  Marland Kitchen SPIRIVA HANDIHALER 18 MCG inhalation capsule   . SPIRIVA RESPIMAT 1.25 MCG/ACT AERS Inhale 1 puff into the lungs daily.  Marland Kitchen THEO-24 100 MG 24 hr capsule Take 100 mg by mouth daily.    Past Medical History:  Diagnosis Date  . Asthma   . Hypertension     Past Surgical History:  Procedure Laterality Date  . INCISION AND DRAINAGE ABSCESS Right 06/29/2016   Procedure: INCISION AND DRAINAGE ABSCESS;  Surgeon: Florene Glen, MD;  Location: [facility] ORS;  Service: General;  Laterality: Right;  . INCISION AND DRAINAGE OF WOUND Left 06/29/2016   Procedure: IRRIGATION AND DEBRIDEMENT WOUND;  Surgeon: Florene Glen, MD;  Location: [facility] ORS;  Service: General;  Laterality: Left;    Social History Social History  Substance Use Topics  . Smoking status: Never Smoker  . Smokeless tobacco: Not on file  . Alcohol use Not on file    Family History Family History  Problem  Relation Age of Onset  . Dementia Mother   . Osteoporosis Mother   . Vascular Disease Mother   . COPD Father   No family history of bleeding/clotting disorders, porphyria or autoimmune disease   No Known Allergies   REVIEW OF SYSTEMS (Negative unless checked)  Constitutional: [] Weight loss  [] Fever  [] Chills Cardiac: [] Chest pain   [] Chest pressure   [] Palpitations   [] Shortness of breath when laying flat   [] Shortness of breath with exertion. Vascular:  [x] Pain in legs with walking   [x] Pain in legs at rest  [] History of DVT   [] Phlebitis   [x] Swelling in legs   [x] Varicose veins   [] Non-healing ulcers Pulmonary:   [] Uses home oxygen   [] Productive cough   [] Hemoptysis   [] Wheeze  [] COPD   [] Asthma Neurologic:  [] Dizziness   [] Seizures   [] History of stroke   [] History of TIA  [] Aphasia   [] Vissual changes   [] Weakness or numbness in arm   [] Weakness or numbness in leg Musculoskeletal:   [] Joint swelling   [] Joint pain   [] Low back pain Hematologic:  [] Easy bruising  [] Easy bleeding   [] Hypercoagulable state   [] Anemic Gastrointestinal:  [] Diarrhea   [] Vomiting  [] Gastroesophageal reflux/heartburn   [] Difficulty swallowing. Genitourinary:  [] Chronic kidney disease   [] Difficult urination  [] Frequent urination   [] Blood in urine Skin:  [] Rashes   [] Ulcers  Psychological:  [] History of anxiety   []  History of major depression.  Physical Examination  Vitals:   11/17/16 1011  BP: (!) 150/103  Pulse: 67  Resp: 18  Weight: (!) 304 lb (137.9 kg)  Height: 5\' 5"  (1.651 m)   Body mass index is 50.59 kg/m. Gen: WD/WN, NAD Head: [place]/AT, No temporalis wasting.  Ear/Nose/Throat: Hearing grossly intact, nares w/o erythema or drainage, poor dentition Eyes: PER, EOMI, sclera nonicteric.  Neck: Supple, no masses.  No bruit or JVD.  Pulmonary:  Good air movement, clear to auscultation bilaterally, no use of accessory muscles.  Cardiac: RRR, normal S1, S2, no Murmurs. Vascular:  Extensive  large >10 mm varicosities in the right leg, vv present in the left leg but significantly less.  Area of palpable cords consistent with STP, mild to moderate venous stasis dermatitis bilaterally Vessel Right Left  Radial Palpable Palpable  Ulnar Palpable Palpable  Brachial Palpable Palpable  Carotid Palpable Palpable  Femoral Palpable Palpable  Popliteal Palpable Palpable  PT Palpable Palpable  DP Palpable Palpable   Gastrointestinal: soft, non-distended. No guarding/no peritoneal signs.  Musculoskeletal: M/S 5/5 throughout.  No deformity or atrophy.  Neurologic: CN 2-12 intact. Pain and light touch intact in extremities.  Symmetrical.  Speech is fluent. Motor exam as listed above. Psychiatric: Judgment intact, Mood & affect appropriate for pt's clinical situation. Dermatologic: No ulcers noted.  No changes consistent with cellulitis. Lymph : No Cervical lymphadenopathy, no lichenification or skin changes of chronic lymphedema.  CBC Lab Results  Component Value Date   WBC 9.5 06/30/2016   HGB 12.7  06/30/2016   HCT 37.0 06/30/2016   MCV 90.6 06/30/2016   PLT 237 06/30/2016    BMET    Component Value Date/Time   NA 138 06/30/2016 0525   NA 140 11/28/2013 2312   K 3.9 06/30/2016 0525   K 4.1 11/28/2013 2312   CL 107 06/30/2016 0525   CL 108 (H) 11/28/2013 2312   CO2 25 06/30/2016 0525   CO2 27 11/28/2013 2312   GLUCOSE 202 (H) 06/30/2016 0525   GLUCOSE 130 (H) 11/28/2013 2312   BUN 13 06/30/2016 0525   BUN 29 (H) 11/28/2013 2312   CREATININE 0.78 06/30/2016 0525   CREATININE 0.79 11/28/2013 2312   CALCIUM 8.1 (L) 06/30/2016 0525   CALCIUM 8.7 11/28/2013 2312   GFRNONAA >60 06/30/2016 0525   GFRNONAA >60 11/28/2013 2312   GFRAA >60 06/30/2016 0525   GFRAA >60 11/28/2013 2312   CrCl cannot be calculated (Patient's most recent lab result is older than the maximum 21 days allowed.).  COAG No results found for: INR, PROTIME  Radiology No results  found.   Assessment/Plan 1. Superficial thrombophlebitis of right leg  Recommend:  The patient is complaining of symptomatic varicose veins associated with STP of the right leg.  The patient describes them as painful, associated with swelling of both ankles and notes  this is interfering with daily activities and lifestyle.  I have had a long discussion with the patient regarding  varicose veins and why they cause symptoms.  Patient will begin wearing graduated compression stockings class 1 on a daily basis, beginning first thing in the morning and removing them in the evening. The patient is instructed specifically not to sleep in the stockings.    The patient  will also begin using over-the-counter analgesics such as Motrin 600 mg po TID to help control the symptoms.    In addition, behavioral modification including elevation during the day will be initiated.  The patient is also instructed to continue exercising such as walking 4-5 times per week.  Pending the results of these changes the  patient will be reevaluated in three months at which time an  ultrasound of the venous system can be obtained if the symptoms are persistent.  Further plans will be based on the benefits of conservative therapy and the ultrasound results.  - VAS [place] LOWER EXTREMITY VENOUS (DVT); Future  2. Pain of right lower extremity See #1  3. Chronic venous insufficiency continue compression  4. Swelling of limb Continue cmpression    Hortencia Pilar, MD  11/28/2016 3:29 PM

## 2016-12-11 DIAGNOSIS — Z7901 Long term (current) use of anticoagulants: Secondary | ICD-10-CM | POA: Diagnosis not present

## 2016-12-11 DIAGNOSIS — I482 Chronic atrial fibrillation: Secondary | ICD-10-CM | POA: Diagnosis not present

## 2017-02-02 ENCOUNTER — Encounter (INDEPENDENT_AMBULATORY_CARE_PROVIDER_SITE_OTHER): Payer: 59

## 2017-02-02 ENCOUNTER — Ambulatory Visit (INDEPENDENT_AMBULATORY_CARE_PROVIDER_SITE_OTHER): Payer: 59 | Admitting: Vascular Surgery

## 2017-04-03 ENCOUNTER — Ambulatory Visit (INDEPENDENT_AMBULATORY_CARE_PROVIDER_SITE_OTHER): Payer: 59 | Admitting: Cardiovascular Disease

## 2017-04-03 ENCOUNTER — Encounter: Payer: Self-pay | Admitting: Cardiovascular Disease

## 2017-04-03 VITALS — BP 150/68 | HR 103 | Ht 64.0 in | Wt 283.0 lb

## 2017-04-03 DIAGNOSIS — I10 Essential (primary) hypertension: Secondary | ICD-10-CM

## 2017-04-03 DIAGNOSIS — M79605 Pain in left leg: Secondary | ICD-10-CM

## 2017-04-03 DIAGNOSIS — M79604 Pain in right leg: Secondary | ICD-10-CM

## 2017-04-03 DIAGNOSIS — I4891 Unspecified atrial fibrillation: Secondary | ICD-10-CM

## 2017-04-03 MED ORDER — APIXABAN 5 MG PO TABS: 5.0000 mg | ORAL_TABLET | Freq: Two times a day (BID) | ORAL | 3 refills | Status: DC

## 2017-04-03 MED ORDER — DILTIAZEM HCL ER COATED BEADS 240 MG PO CP24: 240.0000 mg | ORAL_CAPSULE | Freq: Every day | ORAL | 3 refills | Status: DC

## 2017-04-03 NOTE — Progress Notes (Signed)
Cardiology Office Note   Date:  04/03/2017   ID:  Charlean, Carneal 09-Nov-1951, MRN [id number]  PCP:  [name], MD  Cardiologist:   Kathlyn Sacramento, MD   Chief Complaint  Patient presents with  . other    New patient. Afib per Dr. Etta Quill office. Patient c/o SOB PAtient has been trying to loose weight. Meds reviewed evrbally wiht patient.       History of Present Illness: Laura [name] is a 66 y.o. female who was referred by Dr. Humphrey Rolls for evaluation of atrial fibrillation.The patient has chronic medical conditions that include obesity, sleep apnea on CPAP, asthma and essential hypertension. She was diagnosed with atrial fibrillation in October 2017. She was evaluated by Dr. Nehemiah Massed late last year for this problem. She underwent a pharmacologic nuclear stress test which showed no evidence of ischemia with ejection fraction of 53%. She had an echocardiogram done which showed mildly reduced LV systolic function with an EF of 50%, mild LVH, moderately dilated left atrium, moderate mitral and tricuspid regurgitation with mild pulmonary hypertension. She was started on flecainide with plans for cardioversion. However, she did not tolerate the medication due to leg swelling and shortness of breath. She was switched to [employer] but could not afford the medication. She was placed on propafenone but again did not tolerate the medication due to leg swelling and shortness of breath. She never underwent cardioversion. She is being treated with diltiazem and digoxin for rate control. She is on anticoagulation with warfarin. Since that time, the patient improved her lifestyle and lost about 25 pounds with significant improvement in shortness of breath and stamina. She denies palpitations, chest pain or orthopnea. She is taking her medications regularly. She reports bilateral leg pain with walking starting from the thigh and going to her calves. This happens mostly with walking but can happen at  rest. She had previous venous Doppler which showed no evidence of DVT.    Past Medical History:  Diagnosis Date  . Asthma   . GERD (gastroesophageal reflux disease)   . Hypertension   . Obesity   . Obstructive sleep apnea   . Persistent atrial fibrillation (HCC)    Diagnosed in October 2017    Past Surgical History:  Procedure Laterality Date  . INCISION AND DRAINAGE ABSCESS Right 06/29/2016   Procedure: INCISION AND DRAINAGE ABSCESS;  Surgeon: Florene Glen, MD;  Location: [facility] ORS;  Service: General;  Laterality: Right;  . INCISION AND DRAINAGE OF WOUND Left 06/29/2016   Procedure: IRRIGATION AND DEBRIDEMENT WOUND;  Surgeon: Florene Glen, MD;  Location: [facility] ORS;  Service: General;  Laterality: Left;     Current Outpatient Prescriptions  Medication Sig Dispense Refill  . dexlansoprazole (DEXILANT) 60 MG capsule Take 60 mg by mouth daily.     Marland Kitchen DIGITEK 125 MCG tablet Take 125 mcg by mouth every other day.   0  . furosemide (LASIX) 40 MG tablet Take 40 mg by mouth daily.  0  . guaiFENesin (MUCINEX) 600 MG 12 hr tablet Take 600 mg by mouth 2 (two) times daily as needed.    . Ipratropium-Albuterol (COMBIVENT RESPIMAT) 20-100 MCG/ACT AERS respimat Inhale 1 puff into the lungs 4 (four) times daily as needed. For shortness of breath/wheezing.    . levalbuterol (XOPENEX) 0.31 MG/3ML nebulizer solution Take 3 mLs by nebulization every 4 (four) hours as needed. For wheezing/ and or shortness of breath.    . losartan (COZAAR) 25 MG  tablet Take 25 mg by mouth daily.  1  . montelukast (SINGULAIR) 10 MG tablet Take 10 mg by mouth daily.  0  . Multiple Vitamin (MULTIVITAMIN) tablet Take 1 tablet by mouth daily.    Marland Kitchen nystatin (MYCOSTATIN) 100000 UNIT/ML suspension SWISH AND SWALLOW 1 TEASPOONFUL BY MOUTH four times a day for 5 days then if needed  0  . phentermine (ADIPEX-P) 37.5 MG tablet Take 37.5 mg by mouth daily.  0  . predniSONE (DELTASONE) 10 MG tablet Take 5-10 mg by mouth See admin  instructions. Take 1/2 tablet (5 mg) by mouth every other day alternating with 1 tablet every other day.  0  . Probiotic Product (PROBIOTIC-10) CAPS Take 1 capsule by mouth 1 day or 1 dose.    Marland Kitchen SPIRIVA HANDIHALER 18 MCG inhalation capsule   0  . spironolactone (ALDACTONE) 25 MG tablet Take 25 mg by mouth 2 (two) times daily.  0  . THEO-24 100 MG 24 hr capsule Take 100 mg by mouth daily.  0  . traMADol (ULTRAM) 50 MG tablet Take 50 mg by mouth every 6 (six) hours as needed.    Marland Kitchen apixaban (ELIQUIS) 5 MG TABS tablet Take 1 tablet (5 mg total) by mouth 2 (two) times daily. 60 tablet 3  . diltiazem (CARDIZEM CD) 240 MG 24 hr capsule Take 1 capsule (240 mg total) by mouth daily. 30 capsule 3   No current facility-administered medications for this visit.     Allergies:   Flecainide and Propafenone    Social History:  The patient  reports that she has never smoked. She has never used smokeless tobacco. She reports that she does not drink alcohol or use drugs.   Family History:  The patient's family history includes COPD in her father; Dementia in her mother; Osteoporosis in her mother; Vascular Disease in her mother.    ROS:  Please see the history of present illness.   Otherwise, review of systems are positive for none.   All other systems are reviewed and negative.    PHYSICAL EXAM: VS:  BP (!) 150/68 (BP Location: Right Arm, Patient Position: Sitting, Cuff Size: Large)   Pulse (!) 103   Ht 5\' 4"  (1.626 m)   Wt 283 lb (128.4 kg)   BMI 48.58 kg/m  , BMI Body mass index is 48.58 kg/m. GEN: Well nourished, well developed, in no acute distress  HEENT: normal  Neck: no JVD, carotid bruits, or masses Cardiac: Irregularly irregular; no murmurs, rubs, or gallops,no edema  Respiratory:  clear to auscultation bilaterally, normal work of breathing GI: soft, nontender, nondistended, + BS MS: no deformity or atrophy  Skin: warm and dry, no rash Neuro:  Strength and sensation are intact Psych:  euthymic mood, full affect   EKG:  EKG is ordered today. The ekg ordered today demonstrates atrial fibrillation with ventricular rate of 105 bpm. Possible old inferior infarct.   Recent Labs: 06/28/2016: ALT 14; Magnesium 2.0; TSH 2.446 06/30/2016: BUN 13; Creatinine, Ser 0.78; Hemoglobin 12.7; Platelets 237; Potassium 3.9; Sodium 138    Lipid Panel No results found for: CHOL, TRIG, HDL, CHOLHDL, VLDL, LDLCALC, LDLDIRECT    Wt Readings from Last 3 Encounters:  04/03/17 283 lb (128.4 kg)  11/17/16 (!) 304 lb (137.9 kg)  07/14/16 296 lb (134.3 kg)      Other studies Reviewed: Additional studies/ records that were reviewed today include: Records from primary care physician and cardiology.. Review of the above records demonstrates: Summarized above.  30 minutes was spent to review her records.  PAD Screen 04/03/2017  Previous PAD dx? No  Previous surgical procedure? No  Pain with walking? No  Feet/toe relief with dangling? No  Painful, non-healing ulcers? No  Extremities discolored? No      ASSESSMENT AND PLAN:  1.  Persistent atrial fibrillation: The patient has been in atrial fibrillation since October. She did not tolerate 2 antiarrhythmic medication and currently she is being treated with rate control. Since she lost weight, she had significant improvement in symptoms and she does not appear to be significantly symptomatic from atrial fibrillation at the present time. I discussed with her management options. Atrial fibrillation including rhythm versus rate control. It might be difficult to get her back in sinus rhythm given that she has been in A. fib for about 6 months now. That would also depends on ejection fraction and atrial size. I requested an echocardiogram for evaluation. CHADS VASc score is at least 3. Thus, I recommend long-term anticoagulation. INR has been difficult to control with warfarin. Thus, I elected to switch her to Eliquis 5 mg twice daily. She did not  tolerate Xarelto and reported lethargy with it. In terms of rate control, I discontinued digoxin and increased the dose of diltiazem extended release to 240 mg once daily.  2. Sleep apnea: Continue treatment with CPAP.  3. Essential hypertension: Blood pressure is elevated. Diltiazem was increased as outlined above.  4. Bilateral leg pain with mildly diminished pulses. She reports that this happens at rest and with activities. I requested lower extremity arterial Doppler.    Disposition:   FU with me in 1 month  Signed,  Kathlyn Sacramento, MD  04/03/2017 1:14 PM    [facility]

## 2017-04-03 NOTE — Patient Instructions (Addendum)
Medication Instructions:  Your physician has recommended you make the following change in your medication:  STOP taking coumadin START taking eliquis 5mg  twice daily STOP taking digoxin INCREASE diltiazem ER to 240mg  once daily   Labwork: none  Testing/Procedures: Your physician has requested that you have an echocardiogram. Echocardiography is a painless test that uses sound waves to create images of your heart. It provides your doctor with information about the size and shape of your heart and how well your heart's chambers and valves are working. This procedure takes approximately one hour. There are no restrictions for this procedure.  Your physician has requested that you have a lower extremity arterial exercise duplex. During this test, exercise and ultrasound are used to evaluate arterial blood flow in the legs. Allow one hour for this exam. There are no restrictions or special instructions.   Follow-Up: Your physician recommends that you schedule a follow-up appointment in: one month with Dr. Fletcher Anon.    Any Other Special Instructions Will Be Listed Below (If Applicable).     If you need a refill on your cardiac medications before your next appointment, please call your pharmacy.  Echocardiogram An echocardiogram, or echocardiography, uses sound waves (ultrasound) to produce an image of your heart. The echocardiogram is simple, painless, obtained within a short period of time, and offers valuable information to your health care provider. The images from an echocardiogram can provide information such as:  Evidence of coronary artery disease (CAD).  Heart size.  Heart muscle function.  Heart valve function.  Aneurysm detection.  Evidence of a past heart attack.  Fluid buildup around the heart.  Heart muscle thickening.  Assess heart valve function. Tell a health care provider about:  Any allergies you have.  All medicines you are taking, including vitamins,  herbs, eye drops, creams, and over-the-counter medicines.  Any problems you or family members have had with anesthetic medicines.  Any blood disorders you have.  Any surgeries you have had.  Any medical conditions you have.  Whether you are pregnant or may be pregnant. What happens before the procedure? No special preparation is needed. Eat and drink normally. What happens during the procedure?  In order to produce an image of your heart, gel will be applied to your chest and a wand-like tool (transducer) will be moved over your chest. The gel will help transmit the sound waves from the transducer. The sound waves will harmlessly bounce off your heart to allow the heart images to be captured in real-time motion. These images will then be recorded.  You may need an IV to receive a medicine that improves the quality of the pictures. What happens after the procedure? You may return to your normal schedule including diet, activities, and medicines, unless your health care provider tells you otherwise. This information is not intended to replace advice given to you by your health care provider. Make sure you discuss any questions you have with your health care provider. Document Released: 12/05/2000 Document Revised: 07/26/2016 Document Reviewed: 08/15/2013 Elsevier Interactive Patient Education  2017 [employer].

## 2017-04-06 ENCOUNTER — Other Ambulatory Visit: Payer: Self-pay

## 2017-04-06 ENCOUNTER — Telehealth: Payer: Self-pay

## 2017-04-06 DIAGNOSIS — I482 Chronic atrial fibrillation, unspecified: Secondary | ICD-10-CM

## 2017-04-06 NOTE — Telephone Encounter (Signed)
Pt stopped coumadin (last dose 4/12) and started eliquis 4/13 as instructed at [place]. As it was in the afternoon, she took one dose Friday then BID all other days. INR check today 2.4 Per Doroteo Bradford, RN, pt may continue eliquis as scheduled. Pt will have BMET and CBC in one month at [facility] outpatient lab. Per pt request, I have left this information on her cell VM.

## 2017-04-09 DIAGNOSIS — J301 Allergic rhinitis due to pollen: Secondary | ICD-10-CM | POA: Diagnosis not present

## 2017-04-09 DIAGNOSIS — J3089 Other allergic rhinitis: Secondary | ICD-10-CM | POA: Diagnosis not present

## 2017-04-14 DIAGNOSIS — J9611 Chronic respiratory failure with hypoxia: Secondary | ICD-10-CM | POA: Diagnosis not present

## 2017-04-14 DIAGNOSIS — I10 Essential (primary) hypertension: Secondary | ICD-10-CM | POA: Diagnosis not present

## 2017-04-14 DIAGNOSIS — I482 Chronic atrial fibrillation: Secondary | ICD-10-CM | POA: Diagnosis not present

## 2017-04-14 DIAGNOSIS — Z7901 Long term (current) use of anticoagulants: Secondary | ICD-10-CM | POA: Diagnosis not present

## 2017-04-15 DIAGNOSIS — G4733 Obstructive sleep apnea (adult) (pediatric): Secondary | ICD-10-CM | POA: Diagnosis not present

## 2017-04-22 ENCOUNTER — Other Ambulatory Visit: Payer: Self-pay | Admitting: Cardiovascular Disease

## 2017-04-22 DIAGNOSIS — M79604 Pain in right leg: Secondary | ICD-10-CM

## 2017-04-22 DIAGNOSIS — M79605 Pain in left leg: Principal | ICD-10-CM

## 2017-04-23 DIAGNOSIS — J301 Allergic rhinitis due to pollen: Secondary | ICD-10-CM | POA: Diagnosis not present

## 2017-04-23 DIAGNOSIS — J3089 Other allergic rhinitis: Secondary | ICD-10-CM | POA: Diagnosis not present

## 2017-04-24 ENCOUNTER — Other Ambulatory Visit: Payer: Self-pay

## 2017-04-24 ENCOUNTER — Telehealth: Payer: Self-pay | Admitting: Cardiovascular Disease

## 2017-04-24 DIAGNOSIS — I482 Chronic atrial fibrillation, unspecified: Secondary | ICD-10-CM

## 2017-04-24 NOTE — Telephone Encounter (Signed)
Pt requests to have CBC and BMET at an outpatient [facility] facility. Lab orders changed, printed and mailed to patient's home.

## 2017-04-24 NOTE — Telephone Encounter (Signed)
Pt states her insurance will pay for her labwork to be done at [facility] and she would like to have her order switched to there.

## 2017-05-07 ENCOUNTER — Other Ambulatory Visit: Payer: Self-pay | Admitting: Cardiovascular Disease

## 2017-05-08 ENCOUNTER — Ambulatory Visit (INDEPENDENT_AMBULATORY_CARE_PROVIDER_SITE_OTHER): Payer: 59

## 2017-05-08 ENCOUNTER — Ambulatory Visit: Payer: 59

## 2017-05-08 ENCOUNTER — Other Ambulatory Visit: Payer: Self-pay

## 2017-05-08 DIAGNOSIS — I4891 Unspecified atrial fibrillation: Secondary | ICD-10-CM | POA: Diagnosis not present

## 2017-05-08 DIAGNOSIS — R0989 Other specified symptoms and signs involving the circulatory and respiratory systems: Secondary | ICD-10-CM | POA: Diagnosis not present

## 2017-05-08 DIAGNOSIS — M79605 Pain in left leg: Principal | ICD-10-CM

## 2017-05-08 DIAGNOSIS — M79604 Pain in right leg: Secondary | ICD-10-CM

## 2017-05-08 LAB — CBC WITH DIFFERENTIAL/PLATELET
BASOS: 0 %
Basophils Absolute: 0 10*3/uL (ref 0.0–0.2)
EOS (ABSOLUTE): 0.2 10*3/uL (ref 0.0–0.4)
Eos: 2 %
HEMATOCRIT: 40.7 % (ref 34.0–46.6)
Hemoglobin: 13.5 g/dL (ref 11.1–15.9)
Immature Grans (Abs): 0 10*3/uL (ref 0.0–0.1)
Immature Granulocytes: 0 %
LYMPHS ABS: 3.5 10*3/uL — AB (ref 0.7–3.1)
Lymphs: 46 %
MCH: 30.5 pg (ref 26.6–33.0)
MCHC: 33.2 g/dL (ref 31.5–35.7)
MCV: 92 fL (ref 79–97)
MONOS ABS: 0.8 10*3/uL (ref 0.1–0.9)
Monocytes: 10 %
NEUTROS PCT: 42 %
Neutrophils Absolute: 3.3 10*3/uL (ref 1.4–7.0)
PLATELETS: 341 10*3/uL (ref 150–379)
RBC: 4.43 x10E6/uL (ref 3.77–5.28)
RDW: 15.2 % (ref 12.3–15.4)
WBC: 7.8 10*3/uL (ref 3.4–10.8)

## 2017-05-08 LAB — BASIC METABOLIC PANEL
BUN / CREAT RATIO: 13 (ref 12–28)
BUN: 14 mg/dL (ref 8–27)
CO2: 24 mmol/L (ref 18–29)
CREATININE: 1.05 mg/dL — AB (ref 0.57–1.00)
Calcium: 9.2 mg/dL (ref 8.7–10.3)
Chloride: 102 mmol/L (ref 96–106)
GFR calc Af Amer: 64 mL/min/{1.73_m2} (ref >59)
GFR, EST NON AFRICAN AMERICAN: 55 mL/min/{1.73_m2} — AB (ref >59)
GLUCOSE: 94 mg/dL (ref 65–99)
Potassium: 4.9 mmol/L (ref 3.5–5.2)
SODIUM: 143 mmol/L (ref 134–144)

## 2017-05-14 DIAGNOSIS — I10 Essential (primary) hypertension: Secondary | ICD-10-CM | POA: Diagnosis not present

## 2017-05-14 DIAGNOSIS — I482 Chronic atrial fibrillation: Secondary | ICD-10-CM | POA: Diagnosis not present

## 2017-05-14 DIAGNOSIS — J9611 Chronic respiratory failure with hypoxia: Secondary | ICD-10-CM | POA: Diagnosis not present

## 2017-05-21 ENCOUNTER — Encounter: Payer: Self-pay | Admitting: Cardiovascular Disease

## 2017-05-21 ENCOUNTER — Ambulatory Visit (INDEPENDENT_AMBULATORY_CARE_PROVIDER_SITE_OTHER): Payer: 59 | Admitting: Cardiovascular Disease

## 2017-05-21 VITALS — BP 132/72 | HR 123 | Ht 64.0 in | Wt 272.0 lb

## 2017-05-21 DIAGNOSIS — I481 Persistent atrial fibrillation: Secondary | ICD-10-CM

## 2017-05-21 DIAGNOSIS — I4819 Other persistent atrial fibrillation: Secondary | ICD-10-CM

## 2017-05-21 DIAGNOSIS — J301 Allergic rhinitis due to pollen: Secondary | ICD-10-CM | POA: Diagnosis not present

## 2017-05-21 DIAGNOSIS — I10 Essential (primary) hypertension: Secondary | ICD-10-CM | POA: Diagnosis not present

## 2017-05-21 DIAGNOSIS — J3089 Other allergic rhinitis: Secondary | ICD-10-CM | POA: Diagnosis not present

## 2017-05-21 MED ORDER — DILTIAZEM HCL ER COATED BEADS 360 MG PO CP24: 360.0000 mg | ORAL_CAPSULE | Freq: Every day | ORAL | 6 refills | Status: DC

## 2017-05-21 NOTE — Progress Notes (Signed)
Cardiology Office Note   Date:  05/21/2017   ID:  Laura [name], Laura [name] 1951/11/10, MRN [id number]  PCP:  [name], MD  Cardiologist:   Kathlyn Sacramento, MD   Chief Complaint  Patient presents with  . other    1 month follow up. Patient states he is doing well and has not had to use her oxygen. Patient denies chest pain. Meds reviewed verbally with patient.       History of Present Illness: Laura [name] is a 66 y.o. female who is here today for a follow-up visit regarding atrial fibrillation. The patient has chronic medical conditions that include obesity, sleep apnea on CPAP, asthma and essential hypertension. She was diagnosed with atrial fibrillation in October 2017. She was evaluated by Dr. Nehemiah Massed late last year for this problem. She underwent a pharmacologic nuclear stress test which showed no evidence of ischemia with ejection fraction of 53%. She had an echocardiogram done which showed mildly reduced LV systolic function with an EF of 50%, mild LVH, moderately dilated left atrium, moderate mitral and tricuspid regurgitation with mild pulmonary hypertension. She was started on flecainide with plans for cardioversion. However, she did not tolerate the medication due to leg swelling and shortness of breath. She was switched to [facility] but could not afford the medication. She was placed on propafenone but again did not tolerate the medication due to leg swelling and shortness of breath. She never underwent cardioversion.  During her initial evaluation visit with me, we discussed options of rhythm versus rate control. She elected for rate control. She was switched from warfarin to Eliquis. Digoxin was discontinued and diltiazem was increased. She underwent an echocardiogram which showed an EF of 50-55%, mild mitral regurgitation and moderately dilated left atrium with no evidence of pulmonary hypertension. Due to atypical leg pain, she underwent lower extremity arterial Doppler  which showed normal ABI with no evidence of peripheral arterial disease.  She reports feeling extremely well with no chest pain, shortness of breath or palpitations. She continues to lose weight and feels better every day. She continues to be in atrial fibrillation but she does not appear to be symptomatic.   Past Medical History:  Diagnosis Date  . Asthma   . GERD (gastroesophageal reflux disease)   . Hypertension   . Obesity   . Obstructive sleep apnea   . Persistent atrial fibrillation (HCC)    Diagnosed in October 2017    Past Surgical History:  Procedure Laterality Date  . INCISION AND DRAINAGE ABSCESS Right 06/29/2016   Procedure: INCISION AND DRAINAGE ABSCESS;  Surgeon: Florene Glen, MD;  Location: [facility] ORS;  Service: General;  Laterality: Right;  . INCISION AND DRAINAGE OF WOUND Left 06/29/2016   Procedure: IRRIGATION AND DEBRIDEMENT WOUND;  Surgeon: Florene Glen, MD;  Location: [facility] ORS;  Service: General;  Laterality: Left;     Current Outpatient Prescriptions  Medication Sig Dispense Refill  . ALPRAZolam (XANAX) 0.25 MG tablet Take 0.25 mg by mouth 2 (two) times daily.  3  . apixaban (ELIQUIS) 5 MG TABS tablet Take 1 tablet (5 mg total) by mouth 2 (two) times daily. 60 tablet 3  . dexlansoprazole (DEXILANT) 60 MG capsule Take 60 mg by mouth daily.     Marland Kitchen diltiazem (CARDIZEM CD) 360 MG 24 hr capsule Take 1 capsule (360 mg total) by mouth daily. 30 capsule 6  . furosemide (LASIX) 40 MG tablet Take 40 mg by mouth daily.  0  .  guaiFENesin (MUCINEX) 600 MG 12 hr tablet Take 600 mg by mouth 2 (two) times daily as needed.    . Ipratropium-Albuterol (COMBIVENT RESPIMAT) 20-100 MCG/ACT AERS respimat Inhale 1 puff into the lungs 4 (four) times daily as needed. For shortness of breath/wheezing.    . levalbuterol (XOPENEX) 0.31 MG/3ML nebulizer solution Take 3 mLs by nebulization every 4 (four) hours as needed. For wheezing/ and or shortness of breath.    . losartan (COZAAR) 25  MG tablet Take 25 mg by mouth daily.  1  . montelukast (SINGULAIR) 10 MG tablet Take 10 mg by mouth daily.  0  . Multiple Vitamin (MULTIVITAMIN) tablet Take 1 tablet by mouth daily.    Marland Kitchen nystatin (MYCOSTATIN) 100000 UNIT/ML suspension SWISH AND SWALLOW 1 TEASPOONFUL BY MOUTH four times a day for 5 days then if needed  0  . phentermine (ADIPEX-P) 37.5 MG tablet Take 37.5 mg by mouth daily.  0  . predniSONE (DELTASONE) 10 MG tablet Take 5-10 mg by mouth See admin instructions. Take 1/2 tablet (5 mg) by mouth every other day alternating with 1 tablet every other day.  0  . Probiotic Product (PROBIOTIC-10) CAPS Take 1 capsule by mouth 1 day or 1 dose.    Marland Kitchen SPIRIVA HANDIHALER 18 MCG inhalation capsule   0  . spironolactone (ALDACTONE) 25 MG tablet Take 25 mg by mouth 2 (two) times daily.  0  . THEO-24 100 MG 24 hr capsule Take 100 mg by mouth daily.  0  . traMADol (ULTRAM) 50 MG tablet Take 50 mg by mouth every 6 (six) hours as needed.     No current facility-administered medications for this visit.     Allergies:   Flecainide and Propafenone    Social History:  The patient  reports that she has never smoked. She has never used smokeless tobacco. She reports that she does not drink alcohol or use drugs.   Family History:  The patient's family history includes COPD in her father; Dementia in her mother; Osteoporosis in her mother; Vascular Disease in her mother.    ROS:  Please see the history of present illness.   Otherwise, review of systems are positive for none.   All other systems are reviewed and negative.    PHYSICAL EXAM: VS:  BP 132/72 (BP Location: Left Arm, Patient Position: Sitting, Cuff Size: Normal)   Pulse (!) 123   Ht 5\' 4"  (1.626 m)   Wt 272 lb (123.4 kg)   BMI 46.69 kg/m  , BMI Body mass index is 46.69 kg/m. GEN: Well nourished, well developed, in no acute distress  HEENT: normal  Neck: no JVD, carotid bruits, or masses Cardiac: Irregularly irregular; no murmurs,  rubs, or gallops,no edema  Respiratory:  clear to auscultation bilaterally, normal work of breathing GI: soft, nontender, nondistended, + BS MS: no deformity or atrophy  Skin: warm and dry, no rash Neuro:  Strength and sensation are intact Psych: euthymic mood, full affect   EKG:  EKG is ordered today. The ekg ordered today demonstrates atrial fibrillation with ventricular rate of 105 bpm. Possible old inferior infarct.   Recent Labs: 06/28/2016: ALT 14; Magnesium 2.0; TSH 2.446 06/30/2016: Hemoglobin 12.7 05/07/2017: BUN 14; Creatinine, Ser 1.05; Platelets 341; Potassium 4.9; Sodium 143    Lipid Panel No results found for: CHOL, TRIG, HDL, CHOLHDL, VLDL, LDLCALC, LDLDIRECT    Wt Readings from Last 3 Encounters:  05/21/17 272 lb (123.4 kg)  04/03/17 283 lb (128.4 kg)  11/17/16 Marland Kitchen)  304 lb (137.9 kg)      PAD Screen 04/03/2017  Previous PAD dx? No  Previous surgical procedure? No  Pain with walking? No  Feet/toe relief with dangling? No  Painful, non-healing ulcers? No  Extremities discolored? No      ASSESSMENT AND PLAN:  1.  Persistent atrial fibrillation: Given that she is feeling significantly better with weight loss, she wants to continue with rate control. Ventricular rate is not optimally controlled and thus I increased diltiazem extended release to 360 mg once daily. If heart rate remains elevated, we can consider resuming digoxin. Metoprolol can be considered but on trying to avoid that given her history of asthma. She is tolerating anticoagulation very well with Eliquis. Labs were unremarkable on anticoagulation.  2. Sleep apnea: Continue treatment with CPAP.  3. Essential hypertension: Blood pressure is reasonably controlled.    Disposition:   FU with me in 3 months  Signed,  Kathlyn Sacramento, MD  05/21/2017 10:24 AM    [place]

## 2017-05-21 NOTE — Patient Instructions (Signed)
Medication Instructions:  Please increase your diltiazem to 360 mg once daily  Labwork: None  Testing/Procedures: None  Follow-Up: Your physician recommends that you schedule a follow-up appointment in: 3 months  If you need a refill on your cardiac medications before your next appointment, please call your pharmacy.

## 2017-06-04 DIAGNOSIS — J3089 Other allergic rhinitis: Secondary | ICD-10-CM | POA: Diagnosis not present

## 2017-06-04 DIAGNOSIS — J301 Allergic rhinitis due to pollen: Secondary | ICD-10-CM | POA: Diagnosis not present

## 2017-06-15 DIAGNOSIS — G4733 Obstructive sleep apnea (adult) (pediatric): Secondary | ICD-10-CM | POA: Diagnosis not present

## 2017-06-15 DIAGNOSIS — J9611 Chronic respiratory failure with hypoxia: Secondary | ICD-10-CM | POA: Diagnosis not present

## 2017-06-15 DIAGNOSIS — Z7901 Long term (current) use of anticoagulants: Secondary | ICD-10-CM | POA: Diagnosis not present

## 2017-06-15 DIAGNOSIS — J301 Allergic rhinitis due to pollen: Secondary | ICD-10-CM | POA: Diagnosis not present

## 2017-06-15 DIAGNOSIS — I482 Chronic atrial fibrillation: Secondary | ICD-10-CM | POA: Diagnosis not present

## 2017-06-15 DIAGNOSIS — J4550 Severe persistent asthma, uncomplicated: Secondary | ICD-10-CM | POA: Diagnosis not present

## 2017-06-18 ENCOUNTER — Ambulatory Visit: Payer: Medicare Other | Admitting: Cardiovascular Disease

## 2017-06-30 DIAGNOSIS — M79606 Pain in leg, unspecified: Secondary | ICD-10-CM | POA: Diagnosis not present

## 2017-06-30 DIAGNOSIS — I10 Essential (primary) hypertension: Secondary | ICD-10-CM | POA: Diagnosis not present

## 2017-07-07 DIAGNOSIS — M5416 Radiculopathy, lumbar region: Secondary | ICD-10-CM | POA: Diagnosis not present

## 2017-07-07 DIAGNOSIS — M545 Low back pain: Secondary | ICD-10-CM | POA: Diagnosis not present

## 2017-07-16 DIAGNOSIS — J301 Allergic rhinitis due to pollen: Secondary | ICD-10-CM | POA: Diagnosis not present

## 2017-07-16 DIAGNOSIS — J3089 Other allergic rhinitis: Secondary | ICD-10-CM | POA: Diagnosis not present

## 2017-07-28 DIAGNOSIS — S76311S Strain of muscle, fascia and tendon of the posterior muscle group at thigh level, right thigh, sequela: Secondary | ICD-10-CM | POA: Diagnosis not present

## 2017-07-28 DIAGNOSIS — M545 Low back pain: Secondary | ICD-10-CM | POA: Diagnosis not present

## 2017-07-28 DIAGNOSIS — S76312S Strain of muscle, fascia and tendon of the posterior muscle group at thigh level, left thigh, sequela: Secondary | ICD-10-CM | POA: Diagnosis not present

## 2017-07-30 DIAGNOSIS — J301 Allergic rhinitis due to pollen: Secondary | ICD-10-CM | POA: Diagnosis not present

## 2017-07-30 DIAGNOSIS — J3089 Other allergic rhinitis: Secondary | ICD-10-CM | POA: Diagnosis not present

## 2017-07-31 DIAGNOSIS — M545 Low back pain: Secondary | ICD-10-CM | POA: Diagnosis not present

## 2017-07-31 DIAGNOSIS — S76312D Strain of muscle, fascia and tendon of the posterior muscle group at thigh level, left thigh, subsequent encounter: Secondary | ICD-10-CM | POA: Diagnosis not present

## 2017-08-09 ENCOUNTER — Other Ambulatory Visit: Payer: Self-pay | Admitting: Cardiovascular Disease

## 2017-08-25 ENCOUNTER — Ambulatory Visit: Payer: Medicare Other | Admitting: Cardiovascular Disease

## 2017-09-08 ENCOUNTER — Encounter: Payer: Self-pay | Admitting: Cardiovascular Disease

## 2017-09-08 ENCOUNTER — Ambulatory Visit (INDEPENDENT_AMBULATORY_CARE_PROVIDER_SITE_OTHER): Payer: 59 | Admitting: Cardiovascular Disease

## 2017-09-08 VITALS — BP 146/72 | HR 79 | Ht 64.0 in | Wt 256.8 lb

## 2017-09-08 DIAGNOSIS — I482 Chronic atrial fibrillation, unspecified: Secondary | ICD-10-CM

## 2017-09-08 DIAGNOSIS — I10 Essential (primary) hypertension: Secondary | ICD-10-CM

## 2017-09-08 NOTE — Patient Instructions (Signed)
Medication Instructions: Continue same medications.   Labwork: None.   Procedures/Testing: None.   Follow-Up: 6 months with Dr. Sohum Delillo.   Any Additional Special Instructions Will Be Listed Below (If Applicable).     If you need a refill on your cardiac medications before your next appointment, please call your pharmacy.   

## 2017-09-08 NOTE — Progress Notes (Signed)
Cardiology Office Note   Date:  09/08/2017   ID:  Laura [name] Feb 24, 1951, MRN [id number]  PCP:  [name], MD  Cardiologist:   Kathlyn Sacramento, MD   Chief Complaint  Patient presents with  . other    3 month follow up. Patient denies chest pain and SOB. Meds reviewed verbally with patient.       History of Present Illness: Laura [name] is a 66 y.o. female who is here today for a follow-up visit regarding chronic atrial fibrillation. The patient has chronic medical conditions that include obesity, sleep apnea on CPAP, asthma and essential hypertension. She was diagnosed with atrial fibrillation in October 2017. She was evaluated by Dr. Nehemiah Massed late last year for this problem. She underwent a pharmacologic nuclear stress test which showed no evidence of ischemia with ejection fraction of 53%. She had an echocardiogram done which showed mildly reduced LV systolic function with an EF of 50%, mild LVH, moderately dilated left atrium, moderate mitral and tricuspid regurgitation with mild pulmonary hypertension. She was started on flecainide with plans for cardioversion. However, she did not tolerate the medication due to leg swelling and shortness of breath. She was switched to [facility] but could not afford the medication. She was placed on propafenone but again did not tolerate the medication due to leg swelling and shortness of breath. She never underwent cardioversion.  She has been treated with rate control for atrial fibrillation due to lack of significant symptoms. During last visit, I increased diltiazem extended release to 360 mg once daily. She has been doing well and denies any chest pain, worsening dyspnea or palpitations. She continues to lose weight and feels much better. She is trying to wean herself off prednisone which she takes for her lungs.   Past Medical History:  Diagnosis Date  . Asthma   . GERD (gastroesophageal reflux disease)   . Hypertension   .  Obesity   . Obstructive sleep apnea   . Persistent atrial fibrillation (HCC)    Diagnosed in October 2017    Past Surgical History:  Procedure Laterality Date  . INCISION AND DRAINAGE ABSCESS Right 06/29/2016   Procedure: INCISION AND DRAINAGE ABSCESS;  Surgeon: Florene Glen, MD;  Location: [facility] ORS;  Service: General;  Laterality: Right;  . INCISION AND DRAINAGE OF WOUND Left 06/29/2016   Procedure: IRRIGATION AND DEBRIDEMENT WOUND;  Surgeon: Florene Glen, MD;  Location: [facility] ORS;  Service: General;  Laterality: Left;     Current Outpatient Prescriptions  Medication Sig Dispense Refill  . ALPRAZolam (XANAX) 0.25 MG tablet Take 0.25 mg by mouth 2 (two) times daily.  3  . dexlansoprazole (DEXILANT) 60 MG capsule Take 60 mg by mouth daily.     Marland Kitchen ELIQUIS 5 MG TABS tablet TAKE 1 TABLET (5 MG TOTAL) BY MOUTH 2 (TWO) TIMES DAILY. 60 tablet 3  . furosemide (LASIX) 40 MG tablet Take 40 mg by mouth daily.  0  . guaiFENesin (MUCINEX) 600 MG 12 hr tablet Take 600 mg by mouth 2 (two) times daily as needed.    . Ipratropium-Albuterol (COMBIVENT RESPIMAT) 20-100 MCG/ACT AERS respimat Inhale 1 puff into the lungs 4 (four) times daily as needed. For shortness of breath/wheezing.    . levalbuterol (XOPENEX) 0.31 MG/3ML nebulizer solution Take 3 mLs by nebulization every 4 (four) hours as needed. For wheezing/ and or shortness of breath.    . losartan (COZAAR) 25 MG tablet Take 25 mg by mouth  daily.  1  . montelukast (SINGULAIR) 10 MG tablet Take 10 mg by mouth daily.  0  . Multiple Vitamin (MULTIVITAMIN) tablet Take 1 tablet by mouth daily.    Marland Kitchen nystatin (MYCOSTATIN) 100000 UNIT/ML suspension SWISH AND SWALLOW 1 TEASPOONFUL BY MOUTH four times a day for 5 days then if needed  0  . phentermine (ADIPEX-P) 37.5 MG tablet Take 37.5 mg by mouth daily.  0  . predniSONE (DELTASONE) 10 MG tablet Take 5-10 mg by mouth See admin instructions. Take 1/2 tablet (5 mg) by mouth every other day alternating with 1  tablet every other day.  0  . Probiotic Product (PROBIOTIC-10) CAPS Take 1 capsule by mouth 1 day or 1 dose.    Marland Kitchen SPIRIVA HANDIHALER 18 MCG inhalation capsule   0  . spironolactone (ALDACTONE) 25 MG tablet Take 25 mg by mouth 2 (two) times daily.  0  . THEO-24 100 MG 24 hr capsule Take 100 mg by mouth daily.  0  . traMADol (ULTRAM) 50 MG tablet Take 50 mg by mouth every 6 (six) hours as needed.    . diltiazem (CARDIZEM CD) 360 MG 24 hr capsule Take 1 capsule (360 mg total) by mouth daily. 30 capsule 6   No current facility-administered medications for this visit.     Allergies:   Flecainide and Propafenone    Social History:  The patient  reports that she has never smoked. She has never used smokeless tobacco. She reports that she does not drink alcohol or use drugs.   Family History:  The patient's family history includes COPD in her father; Dementia in her mother; Osteoporosis in her mother; Vascular Disease in her mother.    ROS:  Please see the history of present illness.   Otherwise, review of systems are positive for none.   All other systems are reviewed and negative.    PHYSICAL EXAM: VS:  BP (!) 146/72 (BP Location: Left Arm, Patient Position: Sitting, Cuff Size: Normal)   Pulse 79   Ht 5\' 4"  (1.626 m)   Wt 256 lb 12 oz (116.5 kg)   BMI 44.07 kg/m  , BMI Body mass index is 44.07 kg/m. GEN: Well nourished, well developed, in no acute distress  HEENT: normal  Neck: no JVD, carotid bruits, or masses Cardiac: Irregularly irregular; no murmurs, rubs, or gallops,no edema  Respiratory:  clear to auscultation bilaterally, normal work of breathing GI: soft, nontender, nondistended, + BS MS: no deformity or atrophy  Skin: warm and dry, no rash Neuro:  Strength and sensation are intact Psych: euthymic mood, full affect   EKG:  EKG is ordered today. The ekg ordered today demonstrates atrial fibrillation with Ventricular rate of 79/m. Possible old inferior infarct   Recent  Labs: 05/07/2017: BUN 14; Creatinine, Ser 1.05; Hemoglobin 13.5; Platelets 341; Potassium 4.9; Sodium 143    Lipid Panel No results found for: CHOL, TRIG, HDL, CHOLHDL, VLDL, LDLCALC, LDLDIRECT    Wt Readings from Last 3 Encounters:  09/08/17 256 lb 12 oz (116.5 kg)  05/21/17 272 lb (123.4 kg)  04/03/17 283 lb (128.4 kg)      PAD Screen 04/03/2017  Previous PAD dx? No  Previous surgical procedure? No  Pain with walking? No  Feet/toe relief with dangling? No  Painful, non-healing ulcers? No  Extremities discolored? No      ASSESSMENT AND PLAN:  1.  Chronic atrial fibrillation: Doing well with rate control and anticoagulation. Continue diltiazem and Eliquis. I made no  changes today.   2. Sleep apnea: Continue treatment with CPAP.  3. Essential hypertension: Blood pressure is reasonably controlled.    Disposition:   FU with me in 6 months   Signed,  Kathlyn Sacramento, MD  09/08/2017 3:33 PM    [place]

## 2017-12-01 ENCOUNTER — Other Ambulatory Visit: Payer: Self-pay | Admitting: Internal Medicine

## 2017-12-03 ENCOUNTER — Ambulatory Visit: Payer: Self-pay | Admitting: Internal Medicine

## 2017-12-15 ENCOUNTER — Other Ambulatory Visit: Payer: Self-pay | Admitting: Cardiovascular Disease

## 2017-12-16 ENCOUNTER — Encounter: Payer: Self-pay | Admitting: Nurse Practitioner

## 2017-12-16 ENCOUNTER — Ambulatory Visit (INDEPENDENT_AMBULATORY_CARE_PROVIDER_SITE_OTHER): Payer: 59 | Admitting: Nurse Practitioner

## 2017-12-16 ENCOUNTER — Other Ambulatory Visit: Payer: Self-pay | Admitting: Internal Medicine

## 2017-12-16 ENCOUNTER — Ambulatory Visit (INDEPENDENT_AMBULATORY_CARE_PROVIDER_SITE_OTHER): Payer: 59

## 2017-12-16 ENCOUNTER — Ambulatory Visit: Payer: Self-pay

## 2017-12-16 VITALS — BP 130/91 | HR 56 | Resp 16 | Ht 64.5 in | Wt 248.0 lb

## 2017-12-16 DIAGNOSIS — I10 Essential (primary) hypertension: Secondary | ICD-10-CM | POA: Diagnosis not present

## 2017-12-16 DIAGNOSIS — K219 Gastro-esophageal reflux disease without esophagitis: Secondary | ICD-10-CM | POA: Diagnosis not present

## 2017-12-16 DIAGNOSIS — Z6841 Body Mass Index (BMI) 40.0 and over, adult: Secondary | ICD-10-CM

## 2017-12-16 DIAGNOSIS — J3081 Allergic rhinitis due to animal (cat) (dog) hair and dander: Secondary | ICD-10-CM

## 2017-12-16 DIAGNOSIS — J4540 Moderate persistent asthma, uncomplicated: Secondary | ICD-10-CM | POA: Diagnosis not present

## 2017-12-16 DIAGNOSIS — R0602 Shortness of breath: Secondary | ICD-10-CM | POA: Insufficient documentation

## 2017-12-16 DIAGNOSIS — J4550 Severe persistent asthma, uncomplicated: Secondary | ICD-10-CM | POA: Insufficient documentation

## 2017-12-16 DIAGNOSIS — I482 Chronic atrial fibrillation, unspecified: Secondary | ICD-10-CM

## 2017-12-16 DIAGNOSIS — R05 Cough: Secondary | ICD-10-CM

## 2017-12-16 DIAGNOSIS — K598 Other specified functional intestinal disorders: Secondary | ICD-10-CM

## 2017-12-16 DIAGNOSIS — G4733 Obstructive sleep apnea (adult) (pediatric): Secondary | ICD-10-CM | POA: Insufficient documentation

## 2017-12-16 DIAGNOSIS — Z9989 Dependence on other enabling machines and devices: Secondary | ICD-10-CM | POA: Insufficient documentation

## 2017-12-16 DIAGNOSIS — R059 Cough, unspecified: Secondary | ICD-10-CM

## 2017-12-16 DIAGNOSIS — F411 Generalized anxiety disorder: Secondary | ICD-10-CM | POA: Insufficient documentation

## 2017-12-16 DIAGNOSIS — J301 Allergic rhinitis due to pollen: Secondary | ICD-10-CM | POA: Insufficient documentation

## 2017-12-16 DIAGNOSIS — Z7901 Long term (current) use of anticoagulants: Secondary | ICD-10-CM | POA: Insufficient documentation

## 2017-12-16 DIAGNOSIS — K5989 Other specified functional intestinal disorders: Secondary | ICD-10-CM | POA: Insufficient documentation

## 2017-12-16 DIAGNOSIS — J3089 Other allergic rhinitis: Secondary | ICD-10-CM | POA: Diagnosis not present

## 2017-12-16 DIAGNOSIS — J9611 Chronic respiratory failure with hypoxia: Secondary | ICD-10-CM | POA: Insufficient documentation

## 2017-12-16 MED ORDER — PHENTERMINE HCL 37.5 MG PO TABS: 37.5000 mg | ORAL_TABLET | Freq: Every day | ORAL | 0 refills | Status: DC

## 2017-12-16 NOTE — Progress Notes (Signed)
Subjective:     Patient ID: Laura [name], female   DOB: [birth year], 66 y.o.   MRN: [id number]  Patient c/o cough since Friday. Most severe at night after eating. Did take a four day taper of prednisone. Has improved some. Is bringing up some green and brown phlegm when she does cough. Denies fever, sore throat, or severe congestion.  Taking phentermine for weight management. Has gone through thanksgiving and Christmas and has not lost any weight but has also not gained any. Her diet restarts today.     Current Outpatient Medications:  .  ALPRAZolam (XANAX) 0.25 MG tablet, Take 0.25 mg by mouth 2 (two) times daily., Disp: , Rfl: 3 .  diltiazem (CARDIZEM CD) 360 MG 24 hr capsule, TAKE 1 CAPSULE (360 MG TOTAL) BY MOUTH DAILY., Disp: 30 capsule, Rfl: 3 .  ELIQUIS 5 MG TABS tablet, TAKE 1 TABLET (5 MG TOTAL) BY MOUTH 2 (TWO) TIMES DAILY., Disp: 60 tablet, Rfl: 3 .  furosemide (LASIX) 40 MG tablet, Take 40 mg by mouth daily., Disp: , Rfl: 0 .  guaiFENesin (MUCINEX) 600 MG 12 hr tablet, Take 600 mg by mouth 2 (two) times daily as needed., Disp: , Rfl:  .  Ipratropium-Albuterol (COMBIVENT RESPIMAT) 20-100 MCG/ACT AERS respimat, Inhale 1 puff into the lungs 4 (four) times daily as needed. For shortness of breath/wheezing., Disp: , Rfl:  .  levalbuterol (XOPENEX) 0.31 MG/3ML nebulizer solution, Take 3 mLs by nebulization every 4 (four) hours as needed. For wheezing/ and or shortness of breath., Disp: , Rfl:  .  losartan (COZAAR) 25 MG tablet, TAKE 1 TABLET BY MOUTH EVERY DAY, Disp: 30 tablet, Rfl: 6 .  montelukast (SINGULAIR) 10 MG tablet, Take 10 mg by mouth daily., Disp: , Rfl: 0 .  Multiple Vitamin (MULTIVITAMIN) tablet, Take 1 tablet by mouth daily., Disp: , Rfl:  .  phentermine (ADIPEX-P) 37.5 MG tablet, Take 1 tablet (37.5 mg total) by mouth daily., Disp: 30 tablet, Rfl: 0 .  predniSONE (DELTASONE) 10 MG tablet, Take 5-10 mg by mouth See admin instructions. Take 1/2 tablet (5 mg) by mouth every  other day alternating with 1 tablet every other day., Disp: , Rfl: 0 .  Probiotic Product (PROBIOTIC-10) CAPS, Take 1 capsule by mouth 1 day or 1 dose., Disp: , Rfl:  .  spironolactone (ALDACTONE) 25 MG tablet, TAKE 1 TABLET BY MOUTH TWICE A DAY, Disp: 60 tablet, Rfl: 3 .  THEO-24 100 MG 24 hr capsule, Take 100 mg by mouth daily., Disp: , Rfl: 0 .  traMADol (ULTRAM) 50 MG tablet, Take 50 mg by mouth every 6 (six) hours as needed., Disp: , Rfl:  .  dexlansoprazole (DEXILANT) 60 MG capsule, Take 60 mg by mouth daily. , Disp: , Rfl:  .  EPINEPHrine 0.3 mg/0.3 mL IJ SOAJ injection, Inject into the muscle., Disp: , Rfl:  .  ibuprofen (ADVIL,MOTRIN) 200 MG tablet, Take by mouth., Disp: , Rfl:  .  nystatin (MYCOSTATIN) 100000 UNIT/ML suspension, SWISH AND SWALLOW 1 TEASPOONFUL BY MOUTH four times a day for 5 days then if needed, Disp: , Rfl: 0 .  SPIRIVA HANDIHALER 18 MCG inhalation capsule, , Disp: , Rfl: 0  Review of Systems  Constitutional: Negative for unexpected weight change.  HENT: Positive for congestion and postnasal drip.   Eyes: Negative.   Respiratory: Positive for cough and wheezing.   Cardiovascular: Positive for palpitations.  Endocrine: Negative.   Genitourinary: Negative.   Musculoskeletal: Negative.   Allergic/Immunologic: Positive for  environmental allergies.  Neurological: Negative.   Hematological: Negative.   Psychiatric/Behavioral: Negative.    Today's Vitals   12/16/17 0845  BP: (!) 130/91  Pulse: (!) 56  Resp: 16  SpO2: 97%  Weight: 248 lb (112.5 kg)  Height: 5' 4.5" (1.638 m)      Objective:   Physical Exam  Constitutional: She is oriented to person, place, and time. Vital signs are normal. She appears well-developed and well-nourished. She is active.  Moderate obesity  HENT:  Head: Normocephalic and atraumatic.  Right Ear: Tympanic membrane and external ear normal.  Left Ear: Tympanic membrane and external ear normal.  Nose: No rhinorrhea or sinus  tenderness. Right sinus exhibits no maxillary sinus tenderness and no frontal sinus tenderness. Left sinus exhibits no maxillary sinus tenderness and no frontal sinus tenderness.  Eyes: Pupils are equal, round, and reactive to light.  Neck: Normal range of motion. Neck supple.  Cardiovascular: Intact distal pulses. An irregularly irregular rhythm present. Frequent extrasystoles are present. Tachycardia present.  Pulmonary/Chest: No accessory muscle usage. No respiratory distress. She has wheezes in the right middle field and the left middle field.  Very scant inspiratory wheezing, bilaterally. Harsh, non-productive cough present.   Abdominal: Soft. There is no tenderness.  Neurological: She is alert and oriented to person, place, and time.  Skin: Skin is warm and dry.  Psychiatric: She has a normal mood and affect.       Assessment:     Body mass index (BMI) 40.0-44.9, adult (HCC) - Plan: phentermine (ADIPEX-P) 37.5 MG tablet  Cough  Essential (primary) hypertension  Chronic respiratory failure with hypoxia (HCC)  Gastro-esophageal reflux disease without esophagitis  Chronic atrial fibrillation (HCC)  Allergic rhinitis due to animal (cat) (dog) hair and dander  Severe persistent asthma with acute exacerbation     Plan:     . Continue phentermine 37.5mg  tabs daily. Limit calorie intake to 1500 calories per day and participate in routine exercise program. 2. May be related to worsening reflux due to holiday eating. Recommend zantac every evening. May also consider taking mucinex as needed 3. Stable BP 4. GERD - add xantac 75mg  at neight and as needed. Avoid triggers. 5. A-fib controlled 6. Continue all allergy medications as prescribed  7. Continue all inhalers and respiratory medications as prescribed   Follow up  1 month for weight managemetn

## 2017-12-16 NOTE — Patient Instructions (Addendum)

## 2017-12-16 NOTE — Telephone Encounter (Signed)
Refill Request.  

## 2017-12-24 ENCOUNTER — Ambulatory Visit (INDEPENDENT_AMBULATORY_CARE_PROVIDER_SITE_OTHER): Payer: 59 | Admitting: Internal Medicine

## 2017-12-24 ENCOUNTER — Encounter: Payer: Self-pay | Admitting: Internal Medicine

## 2017-12-24 VITALS — BP 133/66 | HR 60 | Resp 16 | Ht 64.5 in | Wt 250.2 lb

## 2017-12-24 DIAGNOSIS — J449 Chronic obstructive pulmonary disease, unspecified: Secondary | ICD-10-CM | POA: Diagnosis not present

## 2017-12-24 DIAGNOSIS — J301 Allergic rhinitis due to pollen: Secondary | ICD-10-CM | POA: Diagnosis not present

## 2017-12-24 DIAGNOSIS — G4730 Sleep apnea, unspecified: Secondary | ICD-10-CM

## 2017-12-24 NOTE — Patient Instructions (Signed)

## 2017-12-24 NOTE — Progress Notes (Signed)
[facility] [place], [place] [address]  Pulmonary Sleep Medicine  Office Visit Note  Patient Name: Laura [name] DOB: 1951/01/21 MRN [id number]  Date of Service: 12/24/2017     Complaints/HPI:  Patient is here for follow-up of sleep apnea and COPD.  She is doing very well.  She has been excellent compliant with her CPAP.  She has also been able to lose her weight and she is doing much better she is more active.  No recent exacerbations of COPD noted as far she is aware.  She has had noted patient's to the hospital.  She denies any chest pain no cough no congestion.  She has noted no wheezing.  She has no nausea no vomiting no diarrhea  Current Medication: Outpatient Encounter Medications as of 12/24/2017  Medication Sig Note  . ALPRAZolam (XANAX) 0.25 MG tablet Take 0.25 mg by mouth 2 (two) times daily.   Marland Kitchen diltiazem (CARDIZEM CD) 360 MG 24 hr capsule TAKE 1 CAPSULE (360 MG TOTAL) BY MOUTH DAILY.   Marland Kitchen ELIQUIS 5 MG TABS tablet TAKE 1 TABLET (5 MG TOTAL) BY MOUTH 2 (TWO) TIMES DAILY.   Marland Kitchen EPINEPHrine 0.3 mg/0.3 mL IJ SOAJ injection Inject into the muscle.   . furosemide (LASIX) 40 MG tablet Take 40 mg by mouth daily.   Marland Kitchen guaiFENesin (MUCINEX) 600 MG 12 hr tablet Take 600 mg by mouth 2 (two) times daily as needed.   Marland Kitchen ibuprofen (ADVIL,MOTRIN) 200 MG tablet Take by mouth.   . Ipratropium-Albuterol (COMBIVENT RESPIMAT) 20-100 MCG/ACT AERS respimat Inhale 1 puff into the lungs 4 (four) times daily as needed. For shortness of breath/wheezing.   . levalbuterol (XOPENEX) 0.31 MG/3ML nebulizer solution Take 3 mLs by nebulization every 4 (four) hours as needed. For wheezing/ and or shortness of breath.   . losartan (COZAAR) 25 MG tablet TAKE 1 TABLET BY MOUTH EVERY DAY   . montelukast (SINGULAIR) 10 MG tablet Take 10 mg by mouth daily.   . Multiple Vitamin (MULTIVITAMIN) tablet Take 1 tablet by mouth daily.   Marland Kitchen nystatin (MYCOSTATIN) 100000 UNIT/ML suspension SWISH AND  SWALLOW 1 TEASPOONFUL BY MOUTH four times a day for 5 days then if needed 07/04/2016: Received from: External Pharmacy  . phentermine (ADIPEX-P) 37.5 MG tablet Take 1 tablet (37.5 mg total) by mouth daily.   . predniSONE (DELTASONE) 10 MG tablet Take 5-10 mg by mouth See admin instructions. Take 1/2 tablet (5 mg) by mouth every other day alternating with 1 tablet every other day.   . spironolactone (ALDACTONE) 25 MG tablet TAKE 1 TABLET BY MOUTH TWICE A DAY   . THEO-24 100 MG 24 hr capsule Take 100 mg by mouth daily.   Marland Kitchen dexlansoprazole (DEXILANT) 60 MG capsule Take 60 mg by mouth daily.    . Probiotic Product (PROBIOTIC-10) CAPS Take 1 capsule by mouth 1 day or 1 dose.   Marland Kitchen SPIRIVA HANDIHALER 18 MCG inhalation capsule  07/04/2016: Received from: External Pharmacy  . traMADol (ULTRAM) 50 MG tablet Take 50 mg by mouth every 6 (six) hours as needed.    No facility-administered encounter medications on file as of 12/24/2017.     Surgical History: Past Surgical History:  Procedure Laterality Date  . INCISION AND DRAINAGE ABSCESS Right 06/29/2016   Procedure: INCISION AND DRAINAGE ABSCESS;  Surgeon: Florene Glen, MD;  Location: [facility] ORS;  Service: General;  Laterality: Right;  . INCISION AND DRAINAGE OF WOUND Left 06/29/2016   Procedure: IRRIGATION AND  DEBRIDEMENT WOUND;  Surgeon: Florene Glen, MD;  Location: [facility] ORS;  Service: General;  Laterality: Left;    Medical History: Past Medical History:  Diagnosis Date  . Asthma   . GERD (gastroesophageal reflux disease)   . Hypertension   . Obesity   . Obstructive sleep apnea   . Persistent atrial fibrillation (HCC)    Diagnosed in October 2017    Family History: Family History  Problem Relation Age of Onset  . Dementia Mother   . Osteoporosis Mother   . Vascular Disease Mother   . COPD Father     Social History: Social History   Socioeconomic History  . Marital status: Married    Spouse name: Not on file  . Number of children:  Not on file  . Years of education: Not on file  . Highest education level: Not on file  Social Needs  . Financial resource strain: Not on file  . Food insecurity - worry: Not on file  . Food insecurity - inability: Not on file  . Transportation needs - medical: Not on file  . Transportation needs - non-medical: Not on file  Occupational History  . Not on file  Tobacco Use  . Smoking status: Never Smoker  . Smokeless tobacco: Never Used  Substance and Sexual Activity  . Alcohol use: No  . Drug use: No  . Sexual activity: Not on file  Other Topics Concern  . Not on file  Social History Narrative  . Not on file     ROS  General: (-) fever, (-) chills, (-) night sweats, (-) weakness, (-) changes in appetite. Skin: (-) rashes, (-) itching,. Eyes: (-) visual changes, (-) redness, (-) itching, (-) double or blurred vision. Nose and Sinuses: (-) nasal stuffiness or itchiness, (-) postnasal drip, (-) nosebleeds, (-) sinus trouble. Mouth and Throat: (-) sore throat, (-) hoarseness. Neck: (-) swollen glands, (-) enlarged thyroid, (-) neck pain. Respiratory: (-) cough, (-) bloody sputum, (-) shortness of breath, (-) wheezing. Cardiovascular: (-) ankle swelling, (-) chest pain. Lymphatic: (-) lymph node enlargement, (-) lymph node tenderness. Neurologic: (-) numbness, (-) tingling,(-) dizziness. Psychiatric: (-) anxiety, (-) depression.  Vital Signs: Blood pressure 133/66, pulse 60, resp. rate 16, height 5' 4.5" (1.638 m), weight 250 lb 3.2 oz (113.5 kg), SpO2 100 %.  Examination: General Appearance: The patient is well-developed, well-nourished, and in no distress. Skin: Gross inspection of skin demonstrates no evidence of abnormality. Head: Patient's head is normocephalic, no gross deformities. Eyes: no gross deformities noted. ENT: ears appear grossly normal. Nasopharynx appears to be normal. Neck: Supple. No thyromegaly. No LAD. Respiratory: Lungs are clear to auscultation  with no adventitious sounds. Cardiovascular: Normal S1 and S2 without murmur or rub. Extremities: No cyanosis. pulses are equal. Neurologic: Alert and oriented. No involuntary movements.  LABS: No results found for this or any previous visit (from the past 2160 hour(s)).  Radiology: US Venous Img Lower Unilateral Right  Result Date: 10/27/2016 CLINICAL DATA:  Pain and swelling x1 week EXAM: RIGHT LOWER EXTREMITY VENOUS DOPPLER ULTRASOUND TECHNIQUE: Gray-scale sonography with compression, as well as color and duplex ultrasound, were performed to evaluate the deep venous system from the level of the common femoral vein through the popliteal and proximal calf veins. COMPARISON:  None FINDINGS: Normal compressibility of the common femoral, superficial femoral, and popliteal veins, as well as the proximal calf veins. No filling defects to suggest DVT on grayscale or color Doppler imaging. Doppler waveforms show normal direction of  venous flow, normal respiratory phasicity and response to augmentation. Noncompressible thrombosed superficial and subcutaneous varicose veins in the proximal posterior calf. Survey views of the contralateral common femoral vein are unremarkable. IMPRESSION: 1. No evidence of  lower extremity deep vein thrombosis, right. 2. Proximal calf superficial thrombophlebitis. Electronically Signed   By: Lucrezia Europe M.D.   On: 10/27/2016 14:54    No results found.  No results found.    Assessment and Plan: Patient Active Problem List   Diagnosis Date Noted  . Generalized anxiety disorder 12/16/2017  . Essential (primary) hypertension 12/16/2017  . Chronic respiratory failure with hypoxia ([place]) 12/16/2017  . Long term (current) use of anticoagulants 12/16/2017  . Chronic atrial fibrillation ([place]) 12/16/2017  . Other specified functional intestinal disorders 12/16/2017  . Gastro-esophageal reflux disease without esophagitis 12/16/2017  . Obstructive sleep apnea of adult  12/16/2017  . Allergic rhinitis due to animal (cat) (dog) hair and dander 12/16/2017  . Allergic rhinitis due to pollen 12/16/2017  . Severe persistent asthma with acute exacerbation 12/16/2017  . Cough 12/16/2017  . Shortness of breath 12/16/2017  . Superficial thrombophlebitis of right leg 11/17/2016  . Pain in limb 11/17/2016  . Chronic venous insufficiency 11/17/2016  . Swelling of limb 11/17/2016  . Cellulitis of buttock   . Cellulitis of right axilla   . Sepsis ([place]) 06/28/2016  . Cellulitis and abscess 06/28/2016  . Hypokalemia 06/28/2016  . Acute renal insufficiency 06/28/2016  . Hyperglycemia 06/28/2016  . Leukocytosis 06/28/2016  . Hypoxia 06/28/2016  . Collagenous colitis 05/13/2016    1. Sleep Apnea  doing well on CPAP which will be continued She is also on oxygen at nighttime.  This will be continued  2. Allergic Rhinitis  doing well with her allergy shots we will continue with his Patient is seen significant improvement  3. Obesity  morbid obesity noted she has been able to lose weight  4. COPD  follow-up pulmonary function tests will be added We will continue supportive care on current medications   General Counseling: I have discussed the findings of the evaluation and examination with Laura [name].  I have also discussed any further diagnostic evaluation thatmay be needed or ordered today. Hasset verbalizes understanding of the findings of todays visit. We also reviewed her medications today and discussed drug interactions and side effects including but not limited excessive drowsiness and altered mental states. We also discussed that there is always a risk not just to her but also people around her. she has been encouraged to call the office with any questions or concerns that should arise related to todays visit.    Time spent: 79min  I have personally obtained a history, examined the patient, evaluated laboratory and imaging results, formulated the  assessment and plan and placed orders.    Allyne Gee, MD [facility] Pulmonary and Critical Care Sleep medicine

## 2017-12-30 ENCOUNTER — Ambulatory Visit (INDEPENDENT_AMBULATORY_CARE_PROVIDER_SITE_OTHER): Payer: 59

## 2017-12-30 DIAGNOSIS — J301 Allergic rhinitis due to pollen: Secondary | ICD-10-CM

## 2018-01-01 ENCOUNTER — Other Ambulatory Visit: Payer: Self-pay | Admitting: Internal Medicine

## 2018-01-04 ENCOUNTER — Other Ambulatory Visit: Payer: Self-pay | Admitting: Internal Medicine

## 2018-01-04 MED ORDER — ALPRAZOLAM 0.25 MG PO TABS: 0.2500 mg | ORAL_TABLET | Freq: Two times a day (BID) | ORAL | 3 refills | Status: DC

## 2018-01-04 NOTE — Telephone Encounter (Signed)
Pt needs refill on xanax. °

## 2018-01-05 NOTE — Telephone Encounter (Signed)
done

## 2018-01-07 ENCOUNTER — Other Ambulatory Visit: Payer: Self-pay | Admitting: Nurse Practitioner

## 2018-01-07 ENCOUNTER — Other Ambulatory Visit: Payer: Self-pay

## 2018-01-07 DIAGNOSIS — J42 Unspecified chronic bronchitis: Principal | ICD-10-CM

## 2018-01-07 DIAGNOSIS — J209 Acute bronchitis, unspecified: Secondary | ICD-10-CM

## 2018-01-07 MED ORDER — AZITHROMYCIN 250 MG PO TABS: ORAL_TABLET | ORAL | 0 refills | Status: DC

## 2018-01-07 MED ORDER — PREDNISONE 10 MG PO TABS: ORAL_TABLET | ORAL | 0 refills | Status: DC

## 2018-01-07 NOTE — Progress Notes (Signed)
Sent in z-pack and prednisone 6 day taper. Use inhalers and nebs as needed and as prescribed. OTC mucinex DM to help congestion and cough.

## 2018-01-11 ENCOUNTER — Other Ambulatory Visit: Payer: Self-pay | Admitting: Internal Medicine

## 2018-01-14 ENCOUNTER — Other Ambulatory Visit: Payer: Self-pay | Admitting: Internal Medicine

## 2018-01-14 ENCOUNTER — Other Ambulatory Visit: Payer: Self-pay | Admitting: Nurse Practitioner

## 2018-01-14 DIAGNOSIS — Z6841 Body Mass Index (BMI) 40.0 and over, adult: Secondary | ICD-10-CM

## 2018-01-14 MED ORDER — PHENTERMINE HCL 37.5 MG PO TABS: 37.5000 mg | ORAL_TABLET | Freq: Every day | ORAL | 0 refills | Status: DC

## 2018-01-14 NOTE — Progress Notes (Signed)
Renewed phentermine daily to last until next visit

## 2018-01-22 ENCOUNTER — Ambulatory Visit: Payer: Self-pay | Admitting: Nurse Practitioner

## 2018-01-22 ENCOUNTER — Ambulatory Visit: Payer: Self-pay | Admitting: Internal Medicine

## 2018-01-25 ENCOUNTER — Ambulatory Visit (INDEPENDENT_AMBULATORY_CARE_PROVIDER_SITE_OTHER): Payer: 59

## 2018-01-25 DIAGNOSIS — J301 Allergic rhinitis due to pollen: Secondary | ICD-10-CM | POA: Diagnosis not present

## 2018-01-28 ENCOUNTER — Other Ambulatory Visit: Payer: Self-pay | Admitting: Internal Medicine

## 2018-02-08 ENCOUNTER — Ambulatory Visit: Payer: Self-pay

## 2018-02-19 ENCOUNTER — Other Ambulatory Visit: Payer: Self-pay

## 2018-02-19 DIAGNOSIS — J209 Acute bronchitis, unspecified: Secondary | ICD-10-CM

## 2018-02-19 DIAGNOSIS — J42 Unspecified chronic bronchitis: Principal | ICD-10-CM

## 2018-02-19 MED ORDER — AZITHROMYCIN 250 MG PO TABS: ORAL_TABLET | ORAL | 0 refills | Status: DC

## 2018-02-19 MED ORDER — PREDNISONE 10 MG PO TABS: ORAL_TABLET | ORAL | 0 refills | Status: DC

## 2018-02-19 NOTE — Telephone Encounter (Signed)
Pt called coughing,burning in throat and sinus infection as per dfk send z-pak and prednisone

## 2018-02-25 ENCOUNTER — Encounter: Payer: Self-pay | Admitting: Nurse Practitioner

## 2018-02-25 ENCOUNTER — Ambulatory Visit (INDEPENDENT_AMBULATORY_CARE_PROVIDER_SITE_OTHER): Payer: 59

## 2018-02-25 ENCOUNTER — Ambulatory Visit (INDEPENDENT_AMBULATORY_CARE_PROVIDER_SITE_OTHER): Payer: 59 | Admitting: Nurse Practitioner

## 2018-02-25 VITALS — BP 130/80 | HR 67 | Resp 16 | Ht 64.0 in | Wt 252.6 lb

## 2018-02-25 DIAGNOSIS — I482 Chronic atrial fibrillation, unspecified: Secondary | ICD-10-CM

## 2018-02-25 DIAGNOSIS — J4550 Severe persistent asthma, uncomplicated: Secondary | ICD-10-CM | POA: Diagnosis not present

## 2018-02-25 DIAGNOSIS — Z6841 Body Mass Index (BMI) 40.0 and over, adult: Secondary | ICD-10-CM

## 2018-02-25 DIAGNOSIS — J45909 Unspecified asthma, uncomplicated: Secondary | ICD-10-CM | POA: Insufficient documentation

## 2018-02-25 DIAGNOSIS — I10 Essential (primary) hypertension: Secondary | ICD-10-CM | POA: Diagnosis not present

## 2018-02-25 DIAGNOSIS — J301 Allergic rhinitis due to pollen: Secondary | ICD-10-CM | POA: Diagnosis not present

## 2018-02-25 MED ORDER — PHENTERMINE HCL 37.5 MG PO TABS: 37.5000 mg | ORAL_TABLET | Freq: Every day | ORAL | 1 refills | Status: DC

## 2018-02-25 NOTE — Progress Notes (Signed)
[facility] [place], [place] [address]  Internal MEDICINE  Office Visit Note  Patient Name: Laura [name]  [id number]  [id number]  Date of Service: 02/25/2018  Chief Complaint  Patient presents with  . Obesity    weight gain of 4 pounds -     The patient is here for routine follow up exam. She has been on phentermine for some tome to help with weight management. Over the past few weeks, she has been off of the medication and taking prednisone due to flare of COPD with upper respiratory infection. She has gaine 4 pounds since her last visit, but she is ready to go back on medication to help suppress appetite. She is doing well, managing her diet. She is eating lean meat and apples during the day. Does not snack at night. She has returned to her water aerobics for exercise.  The patient states that she has recovered from her respiratory infection and has weaned herself oof of prednisone.     Pt is here for routine follow up.    Current Medication: Outpatient Encounter Medications as of 02/25/2018  Medication Sig Note  . ALPRAZolam (XANAX) 0.25 MG tablet Take 1 tablet (0.25 mg total) by mouth 2 (two) times daily.   Marland Kitchen azithromycin (ZITHROMAX) 250 MG tablet z-pack - take by mouth as directed for 5 days   . dexlansoprazole (DEXILANT) 60 MG capsule Take 60 mg by mouth daily.    Marland Kitchen diltiazem (CARDIZEM CD) 360 MG 24 hr capsule TAKE 1 CAPSULE (360 MG TOTAL) BY MOUTH DAILY.   Marland Kitchen ELIQUIS 5 MG TABS tablet TAKE 1 TABLET (5 MG TOTAL) BY MOUTH 2 (TWO) TIMES DAILY.   Marland Kitchen EPINEPHrine 0.3 mg/0.3 mL IJ SOAJ injection Inject into the muscle.   . furosemide (LASIX) 40 MG tablet TAKE 1 TABLET BY MOUTH EVERY DAY   . guaiFENesin (MUCINEX) 600 MG 12 hr tablet Take 600 mg by mouth 2 (two) times daily as needed.   Marland Kitchen ibuprofen (ADVIL,MOTRIN) 200 MG tablet Take by mouth.   . Ipratropium-Albuterol (COMBIVENT RESPIMAT) 20-100 MCG/ACT AERS respimat Inhale 1 puff into the lungs 4 (four) times  daily as needed. For shortness of breath/wheezing.   . levalbuterol (XOPENEX) 0.31 MG/3ML nebulizer solution Take 3 mLs by nebulization every 4 (four) hours as needed. For wheezing/ and or shortness of breath.   . losartan (COZAAR) 25 MG tablet TAKE 1 TABLET BY MOUTH EVERY DAY   . montelukast (SINGULAIR) 10 MG tablet TAKE 1 TABLET BY MOUTH EVERY DAY   . Multiple Vitamin (MULTIVITAMIN) tablet Take 1 tablet by mouth daily.   Marland Kitchen nystatin (MYCOSTATIN) 100000 UNIT/ML suspension SWISH AND SWALLOW 1 TEASPOONFUL BY MOUTH four times a day for 5 days then if needed 07/04/2016: Received from: External Pharmacy  . phentermine (ADIPEX-P) 37.5 MG tablet Take 1 tablet (37.5 mg total) by mouth daily.   . predniSONE (DELTASONE) 10 MG tablet Take 5-10 mg by mouth See admin instructions. Take 1/2 tablet (5 mg) by mouth every other day alternating with 1 tablet every other day.   . predniSONE (DELTASONE) 10 MG tablet 6 day taper - take by mouth s directed for 6 days   . Probiotic Product (PROBIOTIC-10) CAPS Take 1 capsule by mouth 1 day or 1 dose.   Marland Kitchen SPIRIVA HANDIHALER 18 MCG inhalation capsule  07/04/2016: Received from: External Pharmacy  . spironolactone (ALDACTONE) 25 MG tablet TAKE 1 TABLET BY MOUTH TWICE A DAY   . THEO-24 100  MG 24 hr capsule Take 100 mg by mouth daily.   . traMADol (ULTRAM) 50 MG tablet Take 50 mg by mouth every 6 (six) hours as needed.   . [DISCONTINUED] phentermine (ADIPEX-P) 37.5 MG tablet Take 1 tablet (37.5 mg total) by mouth daily.    No facility-administered encounter medications on file as of 02/25/2018.     Surgical History: Past Surgical History:  Procedure Laterality Date  . INCISION AND DRAINAGE ABSCESS Right 06/29/2016   Procedure: INCISION AND DRAINAGE ABSCESS;  Surgeon: Florene Glen, MD;  Location: [facility] ORS;  Service: General;  Laterality: Right;  . INCISION AND DRAINAGE OF WOUND Left 06/29/2016   Procedure: IRRIGATION AND DEBRIDEMENT WOUND;  Surgeon: Florene Glen, MD;   Location: [facility] ORS;  Service: General;  Laterality: Left;    Medical History: Past Medical History:  Diagnosis Date  . Asthma   . GERD (gastroesophageal reflux disease)   . Hypertension   . Obesity   . Obstructive sleep apnea   . Persistent atrial fibrillation (HCC)    Diagnosed in October 2017    Family History: Family History  Problem Relation Age of Onset  . Dementia Mother   . Osteoporosis Mother   . Vascular Disease Mother   . COPD Father     Social History   Socioeconomic History  . Marital status: Married    Spouse name: Not on file  . Number of children: Not on file  . Years of education: Not on file  . Highest education level: Not on file  Social Needs  . Financial resource strain: Not on file  . Food insecurity - worry: Not on file  . Food insecurity - inability: Not on file  . Transportation needs - medical: Not on file  . Transportation needs - non-medical: Not on file  Occupational History  . Not on file  Tobacco Use  . Smoking status: Never Smoker  . Smokeless tobacco: Never Used  Substance and Sexual Activity  . Alcohol use: No  . Drug use: No  . Sexual activity: Not on file  Other Topics Concern  . Not on file  Social History Narrative  . Not on file      Review of Systems  Constitutional: Negative for unexpected weight change.       Weight gain 4 pounds since last visit.  HENT: Negative for congestion and postnasal drip.   Eyes: Negative.   Respiratory: Positive for shortness of breath. Negative for cough and wheezing.   Cardiovascular: Positive for palpitations.  Gastrointestinal: Negative for diarrhea, nausea and vomiting.  Endocrine: Negative.   Genitourinary: Negative.   Musculoskeletal: Negative.   Skin: Negative for rash.  Allergic/Immunologic: Positive for environmental allergies.  Neurological: Negative for weakness and headaches.  Hematological: Negative for adenopathy.  Psychiatric/Behavioral:       Intermittent anxiety  which is well controlled.     Today's Vitals   02/25/18 1418  BP: 130/80  Pulse: 67  Resp: 16  SpO2: 97%  Weight: 252 lb 9.6 oz (114.6 kg)  Height: 5\' 4"  (1.626 m)    Physical Exam  Constitutional: She is oriented to person, place, and time. Vital signs are normal. She appears well-developed and well-nourished. She is active.  Moderate obesity  HENT:  Head: Normocephalic and atraumatic.  Right Ear: Tympanic membrane and external ear normal.  Left Ear: Tympanic membrane and external ear normal.  Nose: No rhinorrhea or sinus tenderness. Right sinus exhibits no maxillary sinus tenderness and no frontal  sinus tenderness. Left sinus exhibits no maxillary sinus tenderness and no frontal sinus tenderness.  Eyes: Pupils are equal, round, and reactive to light.  Neck: Normal range of motion. Neck supple. No thyromegaly present.  Cardiovascular: Intact distal pulses. An irregularly irregular rhythm present. Frequent extrasystoles are present. Tachycardia present.  Pulmonary/Chest: Effort normal and breath sounds normal. No accessory muscle usage. No respiratory distress. She has no wheezes.  Very scant inspiratory wheezing, bilaterally. Harsh, non-productive cough present.   Abdominal: Soft. Bowel sounds are normal. There is no tenderness.  Musculoskeletal: Normal range of motion.  Lymphadenopathy:    She has no cervical adenopathy.  Neurological: She is alert and oriented to person, place, and time.  Skin: Skin is warm and dry.  Psychiatric: She has a normal mood and affect. Her behavior is normal. Judgment and thought content normal.  Nursing note and vitals reviewed.   Assessment/Plan: 1. Severe persistent asthma without complication Patient stable and doing well. Continue respiratory medications and inhalers as prescribed.   2. Body mass index (BMI) 40.0-44.9, adult (HCC) - phentermine (ADIPEX-P) 37.5 MG tablet; Take 1 tablet (37.5 mg total) by mouth daily.  Dispense: 30 tablet;  Refill: 1  3. Essential (primary) hypertension Stable. Continue bp medication as prescribed   4. Chronic atrial fibrillation (HCC) Regular visits with cardiology as scheduled.   General Counseling: niyla marone understanding of the findings of todays visit and agrees with plan of treatment. I have discussed any further diagnostic evaluation that may be needed or ordered today. We also reviewed her medications today. she has been encouraged to call the office with any questions or concerns that should arise related to todays visit.   There is a liability release in patients' chart. There has been a 10 minute discussion about the side effects including but not limited to elevated blood pressure, anxiety, lack of sleep and dry mouth. Pt understands and will like to start/continue on appetite suppressant at this time. There will be one month RX given at the time of visit with proper follow up. [place] diet plan with restricted calories is given to the pt. Pt understands and agrees with  plan of treatment   This patient was seen by Leretha Pol, FNP- C in Collaboration with Dr [name] as a part of collaborative care agreement  Meds ordered this encounter  Medications  . phentermine (ADIPEX-P) 37.5 MG tablet    Sig: Take 1 tablet (37.5 mg total) by mouth daily.    Dispense:  30 tablet    Refill:  1    Order Specific Question:   Supervising Provider    Answer:   [name] [[id number]]    Time spent: 66 Minutes      Dr [name] Internal medicine

## 2018-03-10 ENCOUNTER — Ambulatory Visit (INDEPENDENT_AMBULATORY_CARE_PROVIDER_SITE_OTHER): Payer: 59

## 2018-03-10 DIAGNOSIS — J301 Allergic rhinitis due to pollen: Secondary | ICD-10-CM | POA: Diagnosis not present

## 2018-03-10 DIAGNOSIS — J3081 Allergic rhinitis due to animal (cat) (dog) hair and dander: Secondary | ICD-10-CM

## 2018-03-17 DIAGNOSIS — Z6841 Body Mass Index (BMI) 40.0 and over, adult: Secondary | ICD-10-CM | POA: Insufficient documentation

## 2018-03-31 ENCOUNTER — Encounter: Payer: Self-pay | Admitting: Physician Assistant

## 2018-03-31 NOTE — Progress Notes (Signed)
Cardiology Office Note Date:  04/01/2018  Patient ID:  Laura [name], Laura [name] [birth year], MRN [id number] PCP:  [name], MD  Cardiologist:  Dr. Fletcher Anon, MD    Chief Complaint: Follow up  History of Present Illness: Laura [name] is a 67 y.o. female with history of chronic Afib on Eliquis, essential HTN, obesity, OSA on CPAP, and allergies/asthma on chronic prednisone with ongoing weaning who presents for follow up of her Afib.   She was previously followed by Dr. Nehemiah Massed, though has more recently transitioned her care to Dr. Fletcher Anon. She previously underwent Lexiscan Myoview in 10/2016 that showed no evidence of ischemia with an EF of 53%. She had an echo in 10/2016 that showed mildly reduced LV systolic function with an EF of 50%, diffuse hypokinesis, mild LVH, moderately dilated left atrium at 48 mm, moderate mitral and tricuspid regurgitation, and mild pulmonary hypertension. She was started on flecainide with plans for DCCV. However, she did not tolerate flecainide due to leg swelling and SOB. She was switched to [facility] but could not afford the medication. She was subsequently placed on propafenone but again did not tolerate the medication due to leg swelling and SOB. She ultimately never underwent DCCV. She most recently underwent echo in 04/2017 that showed an EF of 50-55%, mild MR, moderately dilated left atrium at 48 mm, normal RVSF, PASP normal. She has been managed with rate control due to lack of significant symptoms. She was most recently seen by Dr. Fletcher Anon in 08/2017 for follow up and was doing well.   She comes in doing well today.  She continues to lose weight with diet, exercise, and medication.  She continues to feel the best that she has in many years.  Over the winter 2018 she was able to take a trip to [facility] in [place], [place] and was able to walk the entire park without any issues.  In past years, she has had to rent a scooter to be able to do this.  She denies any chest  pain, dyspnea, or palpitations.  She denies any recent falls or dizziness.  No presyncope or syncope.  No melena or BRBPR.  She is tolerating all medications without issues.  She reports 100% compliance with her CPAP.  She has a goal of dropping another 50 pounds over the next 12 months.  Blood pressure remains well controlled.  Her allergies/asthma are reasonably controlled given the current pollen counts.   Past Medical History:  Diagnosis Date  . Asthma   . Chronic atrial fibrillation ([place])    a. diagnosed in 09/2016; b. failed flecainide and propafenone due to LE swelling and SOB, could not afford Multaq; c. CHADS2VASc => 3 (HTN, age x 1, female); d. on Eliquis  . GERD (gastroesophageal reflux disease)   . History of stress test    a. Lexiscan Myoview 10/2016: no evidence of ischemia, EF 53%  . Hypertension   . Obesity   . Obstructive sleep apnea   . Pulmonary hypertension ([place])   . Systolic dysfunction    a. TTE 10/2016: EF 50%, mild LVH, moderately dilated LA, moderate MR/TR, mild pulmonary hypertension    Past Surgical History:  Procedure Laterality Date  . INCISION AND DRAINAGE ABSCESS Right 06/29/2016   Procedure: INCISION AND DRAINAGE ABSCESS;  Surgeon: Florene Glen, MD;  Location: [facility] ORS;  Service: General;  Laterality: Right;  . INCISION AND DRAINAGE OF WOUND Left 06/29/2016   Procedure: IRRIGATION AND DEBRIDEMENT WOUND;  Surgeon: Delfino Lovett  Melchor Amour, MD;  Location: [facility] ORS;  Service: General;  Laterality: Left;    Current Meds  Medication Sig  . ALPRAZolam (XANAX) 0.25 MG tablet Take 1 tablet (0.25 mg total) by mouth 2 (two) times daily.  Marland Kitchen dexlansoprazole (DEXILANT) 60 MG capsule Take 60 mg by mouth daily.   Marland Kitchen diltiazem (CARDIZEM CD) 360 MG 24 hr capsule TAKE 1 CAPSULE (360 MG TOTAL) BY MOUTH DAILY.  Marland Kitchen ELIQUIS 5 MG TABS tablet TAKE 1 TABLET (5 MG TOTAL) BY MOUTH 2 (TWO) TIMES DAILY.  Marland Kitchen EPINEPHrine 0.3 mg/0.3 mL IJ SOAJ injection Inject into the muscle.  . furosemide  (LASIX) 40 MG tablet TAKE 1 TABLET BY MOUTH EVERY DAY  . guaiFENesin (MUCINEX) 600 MG 12 hr tablet Take 600 mg by mouth 2 (two) times daily as needed.  Marland Kitchen ibuprofen (ADVIL,MOTRIN) 200 MG tablet Take by mouth.  . Ipratropium-Albuterol (COMBIVENT RESPIMAT) 20-100 MCG/ACT AERS respimat Inhale 1 puff into the lungs 4 (four) times daily as needed. For shortness of breath/wheezing.  . levalbuterol (XOPENEX) 0.31 MG/3ML nebulizer solution Take 3 mLs by nebulization every 4 (four) hours as needed. For wheezing/ and or shortness of breath.  . losartan (COZAAR) 25 MG tablet TAKE 1 TABLET BY MOUTH EVERY DAY  . montelukast (SINGULAIR) 10 MG tablet TAKE 1 TABLET BY MOUTH EVERY DAY  . Multiple Vitamin (MULTIVITAMIN) tablet Take 1 tablet by mouth daily.  . phentermine (ADIPEX-P) 37.5 MG tablet Take 1 tablet (37.5 mg total) by mouth daily.  . predniSONE (DELTASONE) 10 MG tablet 6 day taper - take by mouth s directed for 6 days  . Probiotic Product (PROBIOTIC-10) CAPS Take 1 capsule by mouth 1 day or 1 dose.  . spironolactone (ALDACTONE) 25 MG tablet TAKE 1 TABLET BY MOUTH TWICE A DAY  . THEO-24 100 MG 24 hr capsule Take 100 mg by mouth daily.    Allergies:   Flecainide; Propafenone; and Rivaroxaban   Social History:  The patient  reports that she has never smoked. She has never used smokeless tobacco. She reports that she does not drink alcohol or use drugs.   Family History:  The patient's family history includes COPD in her father; Dementia in her mother; Osteoporosis in her mother; Vascular Disease in her mother.  ROS:   Review of Systems  Constitutional: Negative for chills, diaphoresis, fever, malaise/fatigue and weight loss.  HENT: Negative for congestion.   Eyes: Negative for discharge and redness.  Respiratory: Negative for cough, hemoptysis, sputum production, shortness of breath and wheezing.   Cardiovascular: Negative for chest pain, palpitations, orthopnea, claudication, leg swelling and PND.    Gastrointestinal: Negative for abdominal pain, blood in stool, heartburn, melena, nausea and vomiting.  Genitourinary: Negative for hematuria.  Musculoskeletal: Negative for falls and myalgias.  Skin: Negative for rash.  Neurological: Negative for dizziness, tingling, tremors, sensory change, speech change, focal weakness, loss of consciousness and weakness.  Endo/Heme/Allergies: Positive for environmental allergies. Does not bruise/bleed easily.  Psychiatric/Behavioral: Negative for substance abuse. The patient is not nervous/anxious.   All other systems reviewed and are negative.    PHYSICAL EXAM:  VS:  BP 122/60 (BP Location: Left Arm, Patient Position: Sitting, Cuff Size: Large)   Pulse 94   Ht 5' 4.5" (1.638 m)   Wt 247 lb (112 kg)   BMI 41.74 kg/m  BMI: Body mass index is 41.74 kg/m.  Physical Exam  Constitutional: She is oriented to person, place, and time. She appears well-developed and well-nourished.  HENT:  Head: Normocephalic and atraumatic.  Eyes: Right eye exhibits no discharge. Left eye exhibits no discharge.  Neck: Normal range of motion. No JVD present.  Cardiovascular: Normal rate, S1 normal, S2 normal and normal heart sounds. An irregularly irregular rhythm present. Exam reveals no distant heart sounds, no friction rub, no midsystolic click and no opening snap.  No murmur heard. Pulses:      Posterior tibial pulses are 2+ on the right side, and 2+ on the left side.  Pulmonary/Chest: Effort normal and breath sounds normal. No respiratory distress. She has no decreased breath sounds. She has no wheezes. She has no rales. She exhibits no tenderness.  Abdominal: Soft. She exhibits no distension. There is no tenderness.  Musculoskeletal: She exhibits no edema.  Bilateral varicose veins noted  Neurological: She is alert and oriented to person, place, and time.  Skin: Skin is warm and dry. No cyanosis. Nails show no clubbing.  Psychiatric: She has a normal mood and  affect. Her speech is normal and behavior is normal. Judgment and thought content normal.     EKG:  Was ordered and interpreted by me today. Shows A. fib, 94 bpm, rare PVC, incomplete right bundle branch block, possible prior inferior infarct  Recent Labs: 05/07/2017: BUN 14; Creatinine, Ser 1.05; Hemoglobin 13.5; Platelets 341; Potassium 4.9; Sodium 143  No results found for requested labs within last 8760 hours.   CrCl cannot be calculated (Patient's most recent lab result is older than the maximum 21 days allowed.).   Wt Readings from Last 3 Encounters:  04/01/18 247 lb (112 kg)  02/25/18 252 lb 9.6 oz (114.6 kg)  12/24/17 250 lb 3.2 oz (113.5 kg)     Other studies reviewed: Additional studies/records reviewed today include: summarized above  ASSESSMENT AND PLAN:  1. Chronic Afib: Asymptomatic.  Ventricular rates well controlled in the upper limits of normal.  Continue rate control with Cardizem CD 360 mg daily as well as anticoagulation with Eliquis 5 mg twice daily. CHADS2VASc 3 (HTN, age x 1, female).  Check BMP and CBC.  2. Essential HTN: Blood pressure is well controlled today as above.  Continue current medications.  3. Obesity/OSA on CPAP: She is down to 247 pounds from a max of 320 pounds.  She would like to lose another 50 pounds.  She is closely followed by her PCP.  Continue CPAP.   4. Asthma/allergies: Managed by PCP.  Stable.  Disposition: F/u with myself in 6 months.  Current medicines are reviewed at length with the patient today.  The patient did not have any concerns regarding medicines.  Signed, Christell Faith, PA-C 04/01/2018 2:24 PM     [address] [place], [place] [address] [phone]

## 2018-04-01 ENCOUNTER — Ambulatory Visit (INDEPENDENT_AMBULATORY_CARE_PROVIDER_SITE_OTHER): Payer: BLUE CROSS/BLUE SHIELD | Admitting: Physician Assistant

## 2018-04-01 ENCOUNTER — Encounter: Payer: Self-pay | Admitting: Physician Assistant

## 2018-04-01 VITALS — BP 122/60 | HR 94 | Ht 64.5 in | Wt 247.0 lb

## 2018-04-01 DIAGNOSIS — Z9989 Dependence on other enabling machines and devices: Secondary | ICD-10-CM

## 2018-04-01 DIAGNOSIS — I10 Essential (primary) hypertension: Secondary | ICD-10-CM | POA: Diagnosis not present

## 2018-04-01 DIAGNOSIS — I482 Chronic atrial fibrillation, unspecified: Secondary | ICD-10-CM

## 2018-04-01 DIAGNOSIS — G4733 Obstructive sleep apnea (adult) (pediatric): Secondary | ICD-10-CM

## 2018-04-01 NOTE — Patient Instructions (Signed)
Medication Instructions:  Your physician recommends that you continue on your current medications as directed. Please refer to the Current Medication list given to you today.   Labwork: BMET and CBC today  Testing/Procedures: none  Follow-Up: Your physician wants you to follow-up in: 6 months with Christell Faith, PA-C You will receive a reminder letter in the mail two months in advance. If you don't receive a letter, please call our office to schedule the follow-up appointment.   Any Other Special Instructions Will Be Listed Below (If Applicable).     If you need a refill on your cardiac medications before your next appointment, please call your pharmacy.

## 2018-04-02 LAB — CBC
HEMATOCRIT: 44.3 % (ref 34.0–46.6)
Hemoglobin: 15.1 g/dL (ref 11.1–15.9)
MCH: 31 pg (ref 26.6–33.0)
MCHC: 34.1 g/dL (ref 31.5–35.7)
MCV: 91 fL (ref 79–97)
Platelets: 356 10*3/uL (ref 150–379)
RBC: 4.87 x10E6/uL (ref 3.77–5.28)
RDW: 14.1 % (ref 12.3–15.4)
WBC: 8.1 10*3/uL (ref 3.4–10.8)

## 2018-04-02 LAB — BASIC METABOLIC PANEL
BUN/Creatinine Ratio: 25 (ref 12–28)
BUN: 23 mg/dL (ref 8–27)
CALCIUM: 9.4 mg/dL (ref 8.7–10.3)
CHLORIDE: 100 mmol/L (ref 96–106)
CO2: 23 mmol/L (ref 20–29)
Creatinine, Ser: 0.92 mg/dL (ref 0.57–1.00)
GFR calc Af Amer: 74 mL/min/{1.73_m2} (ref >59)
GFR calc non Af Amer: 64 mL/min/{1.73_m2} (ref >59)
GLUCOSE: 97 mg/dL (ref 65–99)
Potassium: 4.8 mmol/L (ref 3.5–5.2)
Sodium: 139 mmol/L (ref 134–144)

## 2018-04-08 ENCOUNTER — Encounter: Payer: Self-pay | Admitting: Nurse Practitioner

## 2018-04-08 ENCOUNTER — Ambulatory Visit (INDEPENDENT_AMBULATORY_CARE_PROVIDER_SITE_OTHER): Payer: BLUE CROSS/BLUE SHIELD

## 2018-04-08 ENCOUNTER — Ambulatory Visit (INDEPENDENT_AMBULATORY_CARE_PROVIDER_SITE_OTHER): Payer: BLUE CROSS/BLUE SHIELD | Admitting: Nurse Practitioner

## 2018-04-08 VITALS — BP 119/67 | HR 56 | Resp 16 | Ht 64.5 in | Wt 249.0 lb

## 2018-04-08 DIAGNOSIS — I10 Essential (primary) hypertension: Secondary | ICD-10-CM

## 2018-04-08 DIAGNOSIS — J301 Allergic rhinitis due to pollen: Secondary | ICD-10-CM

## 2018-04-08 DIAGNOSIS — Z6841 Body Mass Index (BMI) 40.0 and over, adult: Secondary | ICD-10-CM

## 2018-04-08 DIAGNOSIS — J4550 Severe persistent asthma, uncomplicated: Secondary | ICD-10-CM | POA: Diagnosis not present

## 2018-04-08 MED ORDER — PHENTERMINE HCL 37.5 MG PO TABS
37.5000 mg | ORAL_TABLET | Freq: Every day | ORAL | 1 refills | Status: DC
Start: 2018-04-08 — End: 2018-05-20

## 2018-04-08 NOTE — Progress Notes (Signed)
[facility] [place], [place] [address]  Internal MEDICINE  Office Visit Note  Patient Name: Laura [name]  [id number]  [id number]  Date of Service: 04/28/2018    Pt is here for routine follow up.   Chief Complaint  Patient presents with  . Follow-up    weight loss     The patient is here for routine follow up visit. She admits to having some wheezing and allergies. Has become just a little harder to breathe since pollen has become so severe.  She continues to lose weight on phentermine. Has lost additional 3 pounds since her most recent visit .has no negative side effects related to taking this medication.  She reports a small, red, area of inflammation on left forearm. Just noted it last night. Slightly tender. No drainage present. She does have history of significant MRSA infection in the past and just wants to make sure something small does not develop into a life threatening illness again.      Current Medication: Outpatient Encounter Medications as of 04/08/2018  Medication Sig  . ALPRAZolam (XANAX) 0.25 MG tablet Take 1 tablet (0.25 mg total) by mouth 2 (two) times daily.  Marland Kitchen dexlansoprazole (DEXILANT) 60 MG capsule Take 60 mg by mouth daily.   Marland Kitchen EPINEPHrine 0.3 mg/0.3 mL IJ SOAJ injection Inject into the muscle.  Marland Kitchen guaiFENesin (MUCINEX) 600 MG 12 hr tablet Take 600 mg by mouth 2 (two) times daily as needed.  Marland Kitchen ibuprofen (ADVIL,MOTRIN) 200 MG tablet Take by mouth.  . Ipratropium-Albuterol (COMBIVENT RESPIMAT) 20-100 MCG/ACT AERS respimat Inhale 1 puff into the lungs 4 (four) times daily as needed. For shortness of breath/wheezing.  . levalbuterol (XOPENEX) 0.31 MG/3ML nebulizer solution Take 3 mLs by nebulization every 4 (four) hours as needed. For wheezing/ and or shortness of breath.  . losartan (COZAAR) 25 MG tablet TAKE 1 TABLET BY MOUTH EVERY DAY  . montelukast (SINGULAIR) 10 MG tablet TAKE 1 TABLET BY MOUTH EVERY DAY  . Multiple Vitamin  (MULTIVITAMIN) tablet Take 1 tablet by mouth daily.  . phentermine (ADIPEX-P) 37.5 MG tablet Take 1 tablet (37.5 mg total) by mouth daily.  . predniSONE (DELTASONE) 10 MG tablet 6 day taper - take by mouth s directed for 6 days  . Probiotic Product (PROBIOTIC-10) CAPS Take 1 capsule by mouth 1 day or 1 dose.  . spironolactone (ALDACTONE) 25 MG tablet TAKE 1 TABLET BY MOUTH TWICE A DAY  . [DISCONTINUED] diltiazem (CARDIZEM CD) 360 MG 24 hr capsule TAKE 1 CAPSULE (360 MG TOTAL) BY MOUTH DAILY.  . [DISCONTINUED] ELIQUIS 5 MG TABS tablet TAKE 1 TABLET (5 MG TOTAL) BY MOUTH 2 (TWO) TIMES DAILY.  . [DISCONTINUED] furosemide (LASIX) 40 MG tablet TAKE 1 TABLET BY MOUTH EVERY DAY  . [DISCONTINUED] phentermine (ADIPEX-P) 37.5 MG tablet Take 1 tablet (37.5 mg total) by mouth daily.  . [DISCONTINUED] THEO-24 100 MG 24 hr capsule Take 100 mg by mouth daily.   No facility-administered encounter medications on file as of 04/08/2018.     Surgical History: Past Surgical History:  Procedure Laterality Date  . INCISION AND DRAINAGE ABSCESS Right 06/29/2016   Procedure: INCISION AND DRAINAGE ABSCESS;  Surgeon: Florene Glen, MD;  Location: [facility] ORS;  Service: General;  Laterality: Right;  . INCISION AND DRAINAGE OF WOUND Left 06/29/2016   Procedure: IRRIGATION AND DEBRIDEMENT WOUND;  Surgeon: Florene Glen, MD;  Location: [facility] ORS;  Service: General;  Laterality: Left;    Medical History:  Past Medical History:  Diagnosis Date  . Asthma   . Chronic atrial fibrillation ([place])    a. diagnosed in 09/2016; b. failed flecainide and propafenone due to LE swelling and SOB, could not afford Multaq; c. CHADS2VASc => 3 (HTN, age x 1, female); d. on Eliquis  . GERD (gastroesophageal reflux disease)   . History of stress test    a. Lexiscan Myoview 10/2016: no evidence of ischemia, EF 53%  . Hypertension   . Obesity   . Obstructive sleep apnea   . Pulmonary hypertension ([place])   . Systolic dysfunction    a. TTE  10/2016: EF 50%, mild LVH, moderately dilated LA, moderate MR/TR, mild pulmonary hypertension    Family History: Family History  Problem Relation Age of Onset  . Dementia Mother   . Osteoporosis Mother   . Vascular Disease Mother   . COPD Father     Social History   Socioeconomic History  . Marital status: Married    Spouse name: Not on file  . Number of children: Not on file  . Years of education: Not on file  . Highest education level: Not on file  Occupational History  . Not on file  Social Needs  . Financial resource strain: Not on file  . Food insecurity:    Worry: Not on file    Inability: Not on file  . Transportation needs:    Medical: Not on file    Non-medical: Not on file  Tobacco Use  . Smoking status: Never Smoker  . Smokeless tobacco: Never Used  Substance and Sexual Activity  . Alcohol use: No  . Drug use: No  . Sexual activity: Not on file  Lifestyle  . Physical activity:    Days per week: Not on file    Minutes per session: Not on file  . Stress: Not on file  Relationships  . Social connections:    Talks on phone: Not on file    Gets together: Not on file    Attends religious service: Not on file    Active member of club or organization: Not on file    Attends meetings of clubs or organizations: Not on file    Relationship status: Not on file  . Intimate partner violence:    Fear of current or ex partner: Not on file    Emotionally abused: Not on file    Physically abused: Not on file    Forced sexual activity: Not on file  Other Topics Concern  . Not on file  Social History Narrative  . Not on file      Review of Systems  Constitutional: Negative for unexpected weight change.       Weight loss of 3 pound since most recent visit.   HENT: Negative for congestion and postnasal drip.   Eyes: Negative.   Respiratory: Positive for shortness of breath. Negative for cough and wheezing.   Cardiovascular: Positive for palpitations.   Gastrointestinal: Negative for diarrhea, nausea and vomiting.  Endocrine: Negative.   Genitourinary: Negative.   Musculoskeletal: Negative.   Skin: Negative for rash.       Small, red lesion noted on left forearm.   Allergic/Immunologic: Positive for environmental allergies.  Neurological: Negative for weakness and headaches.  Hematological: Negative for adenopathy.  Psychiatric/Behavioral:       Intermittent anxiety which is well controlled.     Today's Vitals   04/08/18 1359  BP: 119/67  Pulse: (!) 56  Resp: 16  SpO2: 94%  Weight: 249 lb (112.9 kg)  Height: 5' 4.5" (1.638 m)    Physical Exam  Constitutional: She is oriented to person, place, and time. Vital signs are normal. She appears well-developed and well-nourished. She is active.  Moderate obesity  HENT:  Head: Normocephalic and atraumatic.  Right Ear: Tympanic membrane and external ear normal.  Left Ear: Tympanic membrane and external ear normal.  Nose: No rhinorrhea or sinus tenderness. Right sinus exhibits no maxillary sinus tenderness and no frontal sinus tenderness. Left sinus exhibits no maxillary sinus tenderness and no frontal sinus tenderness.  Eyes: Pupils are equal, round, and reactive to light.  Neck: Normal range of motion. Neck supple. No thyromegaly present.  Cardiovascular: Intact distal pulses. An irregularly irregular rhythm present. Frequent extrasystoles are present. Tachycardia present.  Pulmonary/Chest: Effort normal and breath sounds normal. No accessory muscle usage. No respiratory distress. She has no wheezes.  Abdominal: Soft. Bowel sounds are normal. There is no tenderness.  Musculoskeletal: Normal range of motion.  Lymphadenopathy:    She has no cervical adenopathy.  Neurological: She is alert and oriented to person, place, and time.  Skin: Skin is warm and dry.  Psychiatric: She has a normal mood and affect. Her behavior is normal. Judgment and thought content normal.  Nursing note and  vitals reviewed.   Assessment/Plan: 1. Seasonal allergic rhinitis due to pollen Continue to use prescribed allergy medications as prescribed as well as nasal spray.   2. Body mass index (BMI) 40.0-44.9, adult (HCC) Continue 1500 calorie diet. Regular exercise program should be continued as tolerated.  - phentermine (ADIPEX-P) 37.5 MG tablet; Take 1 tablet (37.5 mg total) by mouth daily.  Dispense: 30 tablet; Refill: 1  3. Severe persistent asthma without complication conitnue inhalers and respiratory medications as prescribed.   4. Essential (primary) hypertension Stable. Continue bp medication as prescribed.   General Counseling: komal stangelo understanding of the findings of todays visit and agrees with plan of treatment. I have discussed any further diagnostic evaluation that may be needed or ordered today. We also reviewed her medications today. she has been encouraged to call the office with any questions or concerns that should arise related to todays visit.   There is a liability release in patients' chart. There has been a 10 minute discussion about the side effects including but not limited to elevated blood pressure, anxiety, lack of sleep and dry mouth. Pt understands and will like to start/continue on appetite suppressant at this time. There will be one month RX given at the time of visit with proper follow up. Nova diet plan with restricted calories is given to the pt. Pt understands and agrees with  plan of treatment  This patient was seen by Leretha Pol, FNP- C in Collaboration with Dr [name] as a part of collaborative care agreement  Meds ordered this encounter  Medications  . phentermine (ADIPEX-P) 37.5 MG tablet    Sig: Take 1 tablet (37.5 mg total) by mouth daily.    Dispense:  30 tablet    Refill:  1    Order Specific Question:   Supervising Provider    Answer:   [name] [[id number]]    Time spent: 14 Minutes      Dr [name] Internal  medicine

## 2018-04-09 ENCOUNTER — Other Ambulatory Visit: Payer: Self-pay | Admitting: Cardiovascular Disease

## 2018-04-11 ENCOUNTER — Other Ambulatory Visit: Payer: Self-pay | Admitting: Cardiovascular Disease

## 2018-04-12 NOTE — Telephone Encounter (Signed)
Please review for refill, Thanks !  

## 2018-04-21 ENCOUNTER — Telehealth: Payer: Self-pay | Admitting: Internal Medicine

## 2018-04-21 NOTE — Telephone Encounter (Signed)
Prior auth for Theo-24 has been denied, generic brand theophylline er is on the approval list for this medication

## 2018-04-22 ENCOUNTER — Other Ambulatory Visit: Payer: Self-pay | Admitting: Internal Medicine

## 2018-04-22 ENCOUNTER — Ambulatory Visit (INDEPENDENT_AMBULATORY_CARE_PROVIDER_SITE_OTHER): Payer: BLUE CROSS/BLUE SHIELD

## 2018-04-22 DIAGNOSIS — J301 Allergic rhinitis due to pollen: Secondary | ICD-10-CM | POA: Diagnosis not present

## 2018-04-22 MED ORDER — THEOPHYLLINE ER 100 MG PO CP24: 100.0000 mg | ORAL_CAPSULE | Freq: Every day | ORAL | 3 refills | Status: DC

## 2018-04-23 ENCOUNTER — Other Ambulatory Visit: Payer: Self-pay | Admitting: Internal Medicine

## 2018-04-23 MED ORDER — THEOPHYLLINE ER 100 MG PO TB12: 100.0000 mg | ORAL_TABLET | Freq: Two times a day (BID) | ORAL | 3 refills | Status: DC

## 2018-04-26 ENCOUNTER — Telehealth: Payer: Self-pay | Admitting: Internal Medicine

## 2018-04-26 NOTE — Telephone Encounter (Signed)
Informed patient that her disability parking placard is available for pick up

## 2018-04-27 ENCOUNTER — Other Ambulatory Visit: Payer: Self-pay

## 2018-04-27 MED ORDER — FUROSEMIDE 40 MG PO TABS: 40.0000 mg | ORAL_TABLET | Freq: Every day | ORAL | 1 refills | Status: DC

## 2018-04-28 ENCOUNTER — Ambulatory Visit (INDEPENDENT_AMBULATORY_CARE_PROVIDER_SITE_OTHER): Payer: BLUE CROSS/BLUE SHIELD

## 2018-04-28 DIAGNOSIS — G4733 Obstructive sleep apnea (adult) (pediatric): Secondary | ICD-10-CM | POA: Diagnosis not present

## 2018-04-28 NOTE — Progress Notes (Signed)
95 percentile pressure 14   95th percentile leak 11.6   apnea index 0.1 /hr  apnea-hypopnea index  0.3 /hr   total days used  >4 hr 90 days  total days used <4 hr 0 days  Total compliance 100 percent  Patient doing great!!

## 2018-04-30 DIAGNOSIS — J301 Allergic rhinitis due to pollen: Secondary | ICD-10-CM | POA: Diagnosis not present

## 2018-05-04 ENCOUNTER — Other Ambulatory Visit: Payer: Self-pay | Admitting: Nurse Practitioner

## 2018-05-04 MED ORDER — LOSARTAN POTASSIUM 25 MG PO TABS: 25.0000 mg | ORAL_TABLET | Freq: Every day | ORAL | 6 refills | Status: DC

## 2018-05-06 ENCOUNTER — Ambulatory Visit: Payer: Self-pay

## 2018-05-06 ENCOUNTER — Telehealth: Payer: Self-pay | Admitting: Internal Medicine

## 2018-05-06 NOTE — Telephone Encounter (Signed)
meds changed back to theo 24

## 2018-05-06 NOTE — Telephone Encounter (Signed)
Pharmacy told patient that theophylline was no longer on the market , called bc bs to have the theo 24 approved . Laura [name] 24 is now approved . Pharmacy contacted and patient notified

## 2018-05-20 ENCOUNTER — Ambulatory Visit (INDEPENDENT_AMBULATORY_CARE_PROVIDER_SITE_OTHER): Payer: BLUE CROSS/BLUE SHIELD

## 2018-05-20 ENCOUNTER — Encounter: Payer: Self-pay | Admitting: Nurse Practitioner

## 2018-05-20 ENCOUNTER — Ambulatory Visit (INDEPENDENT_AMBULATORY_CARE_PROVIDER_SITE_OTHER): Payer: BLUE CROSS/BLUE SHIELD | Admitting: Nurse Practitioner

## 2018-05-20 VITALS — BP 151/72 | HR 57 | Resp 16 | Ht 64.5 in | Wt 248.4 lb

## 2018-05-20 DIAGNOSIS — I10 Essential (primary) hypertension: Secondary | ICD-10-CM

## 2018-05-20 DIAGNOSIS — J301 Allergic rhinitis due to pollen: Secondary | ICD-10-CM

## 2018-05-20 DIAGNOSIS — J4550 Severe persistent asthma, uncomplicated: Secondary | ICD-10-CM | POA: Diagnosis not present

## 2018-05-20 DIAGNOSIS — Z6841 Body Mass Index (BMI) 40.0 and over, adult: Secondary | ICD-10-CM

## 2018-05-20 MED ORDER — PHENTERMINE HCL 37.5 MG PO TABS: 37.5000 mg | ORAL_TABLET | Freq: Every day | ORAL | 1 refills | Status: DC

## 2018-05-20 NOTE — Progress Notes (Signed)
[facility] [place],  [address]  Internal MEDICINE  Office Visit Note  Patient Name: Laura [name]  [id number]  [id number]  Date of Service: 06/06/2018  Chief Complaint  Patient presents with  . Follow-up    weight loss    She continues to lose weight on phentermine. Has lost additional 1 pounds since her most recent visit .has no negative side effects related to taking this medication.     Pt is here for routine follow up.    Current Medication: Outpatient Encounter Medications as of 05/20/2018  Medication Sig  . ALPRAZolam (XANAX) 0.25 MG tablet Take 1 tablet (0.25 mg total) by mouth 2 (two) times daily.  Marland Kitchen dexlansoprazole (DEXILANT) 60 MG capsule Take 60 mg by mouth daily.   Marland Kitchen diltiazem (CARDIZEM CD) 360 MG 24 hr capsule TAKE 1 CAPSULE (360 MG TOTAL) BY MOUTH DAILY.  Marland Kitchen ELIQUIS 5 MG TABS tablet TAKE 1 TABLET (5 MG TOTAL) BY MOUTH 2 (TWO) TIMES DAILY.  Marland Kitchen EPINEPHrine 0.3 mg/0.3 mL IJ SOAJ injection Inject into the muscle.  . furosemide (LASIX) 40 MG tablet Take 1 tablet (40 mg total) by mouth daily.  Marland Kitchen guaiFENesin (MUCINEX) 600 MG 12 hr tablet Take 600 mg by mouth 2 (two) times daily as needed.  Marland Kitchen ibuprofen (ADVIL,MOTRIN) 200 MG tablet Take by mouth.  . Ipratropium-Albuterol (COMBIVENT RESPIMAT) 20-100 MCG/ACT AERS respimat Inhale 1 puff into the lungs 4 (four) times daily as needed. For shortness of breath/wheezing.  . levalbuterol (XOPENEX) 0.31 MG/3ML nebulizer solution Take 3 mLs by nebulization every 4 (four) hours as needed. For wheezing/ and or shortness of breath.  . losartan (COZAAR) 25 MG tablet Take 1 tablet (25 mg total) by mouth daily.  . montelukast (SINGULAIR) 10 MG tablet TAKE 1 TABLET BY MOUTH EVERY DAY  . Multiple Vitamin (MULTIVITAMIN) tablet Take 1 tablet by mouth daily.  . phentermine (ADIPEX-P) 37.5 MG tablet Take 1 tablet (37.5 mg total) by mouth daily.  . predniSONE (DELTASONE) 10 MG tablet 6 day taper - take by mouth s  directed for 6 days  . Probiotic Product (PROBIOTIC-10) CAPS Take 1 capsule by mouth 1 day or 1 dose.  . spironolactone (ALDACTONE) 25 MG tablet TAKE 1 TABLET BY MOUTH TWICE A DAY  . theophylline (THEO-24) 300 MG 24 hr capsule Take 300 mg by mouth daily.  . [DISCONTINUED] phentermine (ADIPEX-P) 37.5 MG tablet Take 1 tablet (37.5 mg total) by mouth daily.   No facility-administered encounter medications on file as of 05/20/2018.     Surgical History: Past Surgical History:  Procedure Laterality Date  . INCISION AND DRAINAGE ABSCESS Right 06/29/2016   Procedure: INCISION AND DRAINAGE ABSCESS;  Surgeon: Florene Glen, MD;  Location: [facility] ORS;  Service: General;  Laterality: Right;  . INCISION AND DRAINAGE OF WOUND Left 06/29/2016   Procedure: IRRIGATION AND DEBRIDEMENT WOUND;  Surgeon: Florene Glen, MD;  Location: [facility] ORS;  Service: General;  Laterality: Left;    Medical History: Past Medical History:  Diagnosis Date  . Asthma   . Chronic atrial fibrillation ([place])    a. diagnosed in 09/2016; b. failed flecainide and propafenone due to LE swelling and SOB, could not afford Multaq; c. CHADS2VASc => 3 (HTN, age x 1, female); d. on Eliquis  . GERD (gastroesophageal reflux disease)   . History of stress test    a. Lexiscan Myoview 10/2016: no evidence of ischemia, EF 53%  . Hypertension   . Obesity   .  Obstructive sleep apnea   . Pulmonary hypertension ([place])   . Systolic dysfunction    a. TTE 10/2016: EF 50%, mild LVH, moderately dilated LA, moderate MR/TR, mild pulmonary hypertension    Family History: Family History  Problem Relation Age of Onset  . Dementia Mother   . Osteoporosis Mother   . Vascular Disease Mother   . COPD Father     Social History   Socioeconomic History  . Marital status: Married    Spouse name: Not on file  . Number of children: Not on file  . Years of education: Not on file  . Highest education level: Not on file  Occupational History  . Not on  file  Social Needs  . Financial resource strain: Not on file  . Food insecurity:    Worry: Not on file    Inability: Not on file  . Transportation needs:    Medical: Not on file    Non-medical: Not on file  Tobacco Use  . Smoking status: Never Smoker  . Smokeless tobacco: Never Used  Substance and Sexual Activity  . Alcohol use: No  . Drug use: No  . Sexual activity: Not on file  Lifestyle  . Physical activity:    Days per week: Not on file    Minutes per session: Not on file  . Stress: Not on file  Relationships  . Social connections:    Talks on phone: Not on file    Gets together: Not on file    Attends religious service: Not on file    Active member of club or organization: Not on file    Attends meetings of clubs or organizations: Not on file    Relationship status: Not on file  . Intimate partner violence:    Fear of current or ex partner: Not on file    Emotionally abused: Not on file    Physically abused: Not on file    Forced sexual activity: Not on file  Other Topics Concern  . Not on file  Social History Narrative  . Not on file      Review of Systems  Constitutional: Negative for activity change, fatigue and unexpected weight change.       Weight loss of 3 pound since most recent visit.   HENT: Negative for congestion, postnasal drip, rhinorrhea, sinus pressure and sinus pain.   Eyes: Negative.   Respiratory: Positive for shortness of breath and wheezing. Negative for cough.        Intermittent.   Cardiovascular: Positive for palpitations.  Gastrointestinal: Negative for diarrhea, nausea and vomiting.  Endocrine: Negative.   Genitourinary: Negative.   Musculoskeletal: Negative.   Skin: Negative for rash.  Allergic/Immunologic: Positive for environmental allergies.  Neurological: Negative for dizziness, weakness and headaches.  Hematological: Negative for adenopathy.  Psychiatric/Behavioral:       Intermittent anxiety which is well controlled.      Today's Vitals   05/20/18 1627  BP: (!) 151/72  Pulse: (!) 57  Resp: 16  SpO2: 94%  Weight: 248 lb 6.4 oz (112.7 kg)  Height: 5' 4.5" (1.638 m)    Physical Exam  Constitutional: She is oriented to person, place, and time. Vital signs are normal. She appears well-developed and well-nourished. She is active.  Moderate obesity  HENT:  Head: Normocephalic and atraumatic.  Right Ear: Tympanic membrane and external ear normal.  Left Ear: Tympanic membrane and external ear normal.  Nose: No rhinorrhea or sinus tenderness. Right sinus exhibits  no maxillary sinus tenderness and no frontal sinus tenderness. Left sinus exhibits no maxillary sinus tenderness and no frontal sinus tenderness.  Eyes: Pupils are equal, round, and reactive to light. Conjunctivae are normal.  Neck: Normal range of motion. Neck supple. No JVD present. No tracheal deviation present. No thyromegaly present.  Cardiovascular: Intact distal pulses. An irregularly irregular rhythm present. Frequent extrasystoles are present. Tachycardia present.  Pulmonary/Chest: Effort normal and breath sounds normal. No accessory muscle usage. No respiratory distress. She has no wheezes.  Abdominal: Soft. Bowel sounds are normal. There is no tenderness.  Musculoskeletal: Normal range of motion.  Lymphadenopathy:    She has no cervical adenopathy.  Neurological: She is alert and oriented to person, place, and time.  Skin: Skin is warm and dry.  Psychiatric: She has a normal mood and affect. Her behavior is normal. Judgment and thought content normal.  Nursing note and vitals reviewed.  Assessment/Plan: 1. Essential (primary) hypertension BP generally stable. Continue bp medication as prescribed.   2. Severe persistent asthma without complication Continue inhalers and respiratory medication as prescribed.   3. Body mass index (BMI) 40.0-44.9, adult (HCC) OK to continue phentermine. Limit calorie intake to 1200-1500 calories per  day and continue with increased physical activity.  - phentermine (ADIPEX-P) 37.5 MG tablet; Take 1 tablet (37.5 mg total) by mouth daily.  Dispense: 30 tablet; Refill: 1  General Counseling: Laura [name] verbalizes understanding of the findings of todays visit and agrees with plan of treatment. I have discussed any further diagnostic evaluation that may be needed or ordered today. We also reviewed her medications today. she has been encouraged to call the office with any questions or concerns that should arise related to todays visit.    Counseling:    There is a liability release in patients' chart. There has been a 10 minute discussion about the side effects including but not limited to elevated blood pressure, anxiety, lack of sleep and dry mouth. Pt understands and will like to start/continue on appetite suppressant at this time. There will be one month RX given at the time of visit with proper follow up. Nova diet plan with restricted calories is given to the pt. Pt understands and agrees with  plan of treatment  This patient was seen by Leretha Pol, FNP- C in Collaboration with Dr [name] as a part of collaborative care agreement   Meds ordered this encounter  Medications  . phentermine (ADIPEX-P) 37.5 MG tablet    Sig: Take 1 tablet (37.5 mg total) by mouth daily.    Dispense:  30 tablet    Refill:  1    Order Specific Question:   Supervising Provider    Answer:   [name] [[id number]]    Time spent: 63 Minutes      Dr [name] Internal medicine

## 2018-06-03 DIAGNOSIS — L0231 Cutaneous abscess of buttock: Secondary | ICD-10-CM | POA: Diagnosis not present

## 2018-06-06 ENCOUNTER — Other Ambulatory Visit: Payer: Self-pay | Admitting: Internal Medicine

## 2018-06-06 DIAGNOSIS — J42 Unspecified chronic bronchitis: Principal | ICD-10-CM

## 2018-06-06 DIAGNOSIS — L0231 Cutaneous abscess of buttock: Secondary | ICD-10-CM | POA: Diagnosis not present

## 2018-06-06 DIAGNOSIS — J209 Acute bronchitis, unspecified: Secondary | ICD-10-CM

## 2018-06-18 ENCOUNTER — Ambulatory Visit: Payer: Self-pay | Admitting: Nurse Practitioner

## 2018-06-22 ENCOUNTER — Ambulatory Visit (INDEPENDENT_AMBULATORY_CARE_PROVIDER_SITE_OTHER): Payer: BLUE CROSS/BLUE SHIELD | Admitting: Nurse Practitioner

## 2018-06-22 ENCOUNTER — Encounter: Payer: Self-pay | Admitting: Nurse Practitioner

## 2018-06-22 VITALS — BP 128/66 | HR 48 | Resp 16 | Ht 64.0 in | Wt 244.8 lb

## 2018-06-22 DIAGNOSIS — Z6841 Body Mass Index (BMI) 40.0 and over, adult: Secondary | ICD-10-CM | POA: Diagnosis not present

## 2018-06-22 DIAGNOSIS — J449 Chronic obstructive pulmonary disease, unspecified: Secondary | ICD-10-CM | POA: Diagnosis not present

## 2018-06-22 DIAGNOSIS — F411 Generalized anxiety disorder: Secondary | ICD-10-CM

## 2018-06-22 DIAGNOSIS — J4489 Other specified chronic obstructive pulmonary disease: Secondary | ICD-10-CM

## 2018-06-22 DIAGNOSIS — I10 Essential (primary) hypertension: Secondary | ICD-10-CM | POA: Diagnosis not present

## 2018-06-22 MED ORDER — PHENTERMINE HCL 37.5 MG PO TABS: 37.5000 mg | ORAL_TABLET | Freq: Every day | ORAL | 1 refills | Status: DC

## 2018-06-22 MED ORDER — ALPRAZOLAM 0.25 MG PO TABS: 0.2500 mg | ORAL_TABLET | Freq: Two times a day (BID) | ORAL | 3 refills | Status: DC

## 2018-06-22 NOTE — Progress Notes (Signed)
[facility] [place], [place] [address]  Internal MEDICINE  Office Visit Note  Patient Name: Laura [name]  [id number]  [id number]  Date of Service: 07/14/2018   Pt is here for routine follow up.   Chief Complaint  Patient presents with  . Weight Loss    The patient has lost four pounds since her last visit. She continues to take phentermine to help with weight management. Continues to limit calorie intake to 1500 calories per day. She has restarted swimming as her pool has reopened. She feels good. Blood pressure is well controlled.       Current Medication: Outpatient Encounter Medications as of 06/22/2018  Medication Sig  . ALPRAZolam (XANAX) 0.25 MG tablet Take 1 tablet (0.25 mg total) by mouth 2 (two) times daily.  Marland Kitchen dexlansoprazole (DEXILANT) 60 MG capsule Take 60 mg by mouth daily.   Marland Kitchen ELIQUIS 5 MG TABS tablet TAKE 1 TABLET (5 MG TOTAL) BY MOUTH 2 (TWO) TIMES DAILY.  Marland Kitchen EPINEPHrine 0.3 mg/0.3 mL IJ SOAJ injection Inject into the muscle.  . furosemide (LASIX) 40 MG tablet Take 1 tablet (40 mg total) by mouth daily.  Marland Kitchen guaiFENesin (MUCINEX) 600 MG 12 hr tablet Take 600 mg by mouth 2 (two) times daily as needed.  Marland Kitchen ibuprofen (ADVIL,MOTRIN) 200 MG tablet Take by mouth.  . Ipratropium-Albuterol (COMBIVENT RESPIMAT) 20-100 MCG/ACT AERS respimat Inhale 1 puff into the lungs 4 (four) times daily as needed. For shortness of breath/wheezing.  . levalbuterol (XOPENEX) 0.31 MG/3ML nebulizer solution Take 3 mLs by nebulization every 4 (four) hours as needed. For wheezing/ and or shortness of breath.  . losartan (COZAAR) 25 MG tablet Take 1 tablet (25 mg total) by mouth daily.  . montelukast (SINGULAIR) 10 MG tablet TAKE 1 TABLET BY MOUTH EVERY DAY  . Multiple Vitamin (MULTIVITAMIN) tablet Take 1 tablet by mouth daily.  . phentermine (ADIPEX-P) 37.5 MG tablet Take 1 tablet (37.5 mg total) by mouth daily.  . predniSONE (DELTASONE) 10 MG tablet TAKE 1 TABLET BY  MOUTH EVERY DAY  . Probiotic Product (PROBIOTIC-10) CAPS Take 1 capsule by mouth 1 day or 1 dose.  . theophylline (THEO-24) 300 MG 24 hr capsule Take 300 mg by mouth daily.  . [DISCONTINUED] ALPRAZolam (XANAX) 0.25 MG tablet Take 1 tablet (0.25 mg total) by mouth 2 (two) times daily.  . [DISCONTINUED] diltiazem (CARDIZEM CD) 360 MG 24 hr capsule TAKE 1 CAPSULE (360 MG TOTAL) BY MOUTH DAILY.  . [DISCONTINUED] phentermine (ADIPEX-P) 37.5 MG tablet Take 1 tablet (37.5 mg total) by mouth daily.  . [DISCONTINUED] spironolactone (ALDACTONE) 25 MG tablet TAKE 1 TABLET BY MOUTH TWICE A DAY   No facility-administered encounter medications on file as of 06/22/2018.     Surgical History: Past Surgical History:  Procedure Laterality Date  . INCISION AND DRAINAGE ABSCESS Right 06/29/2016   Procedure: INCISION AND DRAINAGE ABSCESS;  Surgeon: Florene Glen, MD;  Location: [facility] ORS;  Service: General;  Laterality: Right;  . INCISION AND DRAINAGE OF WOUND Left 06/29/2016   Procedure: IRRIGATION AND DEBRIDEMENT WOUND;  Surgeon: Florene Glen, MD;  Location: [facility] ORS;  Service: General;  Laterality: Left;    Medical History: Past Medical History:  Diagnosis Date  . Asthma   . Chronic atrial fibrillation ([place])    a. diagnosed in 09/2016; b. failed flecainide and propafenone due to LE swelling and SOB, could not afford Multaq; c. CHADS2VASc => 3 (HTN, age x 1, female); d. on  Eliquis  . GERD (gastroesophageal reflux disease)   . History of stress test    a. Lexiscan Myoview 10/2016: no evidence of ischemia, EF 53%  . Hypertension   . Obesity   . Obstructive sleep apnea   . Pulmonary hypertension ([place])   . Systolic dysfunction    a. TTE 10/2016: EF 50%, mild LVH, moderately dilated LA, moderate MR/TR, mild pulmonary hypertension    Family History: Family History  Problem Relation Age of Onset  . Dementia Mother   . Osteoporosis Mother   . Vascular Disease Mother   . COPD Father     Social  History   Socioeconomic History  . Marital status: Married    Spouse name: Not on file  . Number of children: Not on file  . Years of education: Not on file  . Highest education level: Not on file  Occupational History  . Not on file  Social Needs  . Financial resource strain: Not on file  . Food insecurity:    Worry: Not on file    Inability: Not on file  . Transportation needs:    Medical: Not on file    Non-medical: Not on file  Tobacco Use  . Smoking status: Never Smoker  . Smokeless tobacco: Never Used  Substance and Sexual Activity  . Alcohol use: No  . Drug use: No  . Sexual activity: Not on file  Lifestyle  . Physical activity:    Days per week: Not on file    Minutes per session: Not on file  . Stress: Not on file  Relationships  . Social connections:    Talks on phone: Not on file    Gets together: Not on file    Attends religious service: Not on file    Active member of club or organization: Not on file    Attends meetings of clubs or organizations: Not on file    Relationship status: Not on file  . Intimate partner violence:    Fear of current or ex partner: Not on file    Emotionally abused: Not on file    Physically abused: Not on file    Forced sexual activity: Not on file  Other Topics Concern  . Not on file  Social History Narrative  . Not on file      Review of Systems  Constitutional: Negative for activity change, fatigue and unexpected weight change.       Weight loss of 4 pound since most recent visit.   HENT: Negative for congestion, postnasal drip, rhinorrhea, sinus pressure and sinus pain.   Eyes: Negative.   Respiratory: Positive for shortness of breath and wheezing. Negative for cough.        Intermittent.   Cardiovascular: Positive for palpitations.  Gastrointestinal: Negative for diarrhea, nausea and vomiting.  Endocrine: Negative for cold intolerance, heat intolerance, polydipsia, polyphagia and polyuria.  Genitourinary:  Negative.   Musculoskeletal: Negative for arthralgias, back pain and myalgias.  Skin: Negative for rash.  Allergic/Immunologic: Positive for environmental allergies.  Neurological: Negative for dizziness, weakness and headaches.  Hematological: Negative for adenopathy.  Psychiatric/Behavioral:       Intermittent anxiety which is well controlled.     Today's Vitals   06/22/18 1604  BP: 128/66  Pulse: (!) 48  Resp: 16  SpO2: 98%  Weight: 244 lb 12.8 oz (111 kg)  Height: 5\' 4"  (1.626 m)    Physical Exam  Constitutional: She is oriented to person, place, and time. Vital  signs are normal. She appears well-developed and well-nourished. She is active.  Moderate obesity  HENT:  Head: Normocephalic and atraumatic.  Right Ear: Tympanic membrane and external ear normal.  Left Ear: Tympanic membrane and external ear normal.  Nose: No rhinorrhea or sinus tenderness. Right sinus exhibits no maxillary sinus tenderness and no frontal sinus tenderness. Left sinus exhibits no maxillary sinus tenderness and no frontal sinus tenderness.  Eyes: Pupils are equal, round, and reactive to light. Conjunctivae are normal.  Neck: Normal range of motion. Neck supple. No JVD present. No tracheal deviation present. No thyromegaly present.  Cardiovascular: Intact distal pulses. An irregularly irregular rhythm present. Frequent extrasystoles are present. Tachycardia present.  Pulmonary/Chest: Effort normal and breath sounds normal. No accessory muscle usage. No respiratory distress. She has no wheezes.  Abdominal: Soft. Bowel sounds are normal. There is no tenderness.  Musculoskeletal: Normal range of motion.  Lymphadenopathy:    She has no cervical adenopathy.  Neurological: She is alert and oriented to person, place, and time.  Skin: Skin is warm and dry.  Psychiatric: She has a normal mood and affect. Her behavior is normal. Judgment and thought content normal.  Nursing note and vitals  reviewed.  Assessment/Plan: 1. Obstructive chronic bronchitis without exacerbation ([place]) Well managed. Continue inhalers and respiratory medications as prescribed.   2. Body mass index (BMI) 40.0-44.9, adult (HCC) Continues to do well. Renew phentermine daily. Limit calorie intake to 1500 calories per day. Continue with regular exercise.  - phentermine (ADIPEX-P) 37.5 MG tablet; Take 1 tablet (37.5 mg total) by mouth daily.  Dispense: 30 tablet; Refill: 1  3. Essential (primary) hypertension Stable. Continue bp medication as prescribed.   4. Generalized anxiety disorder May continue alprazolam 0.25mg  twice daily as needed. New rx provided today.  - ALPRAZolam (XANAX) 0.25 MG tablet; Take 1 tablet (0.25 mg total) by mouth 2 (two) times daily.  Dispense: 45 tablet; Refill: 3  General Counseling: Laura [name] verbalizes understanding of the findings of todays visit and agrees with plan of treatment. I have discussed any further diagnostic evaluation that may be needed or ordered today. We also reviewed her medications today. she has been encouraged to call the office with any questions or concerns that should arise related to todays visit.    Counseling:  There is a liability release in patients' chart. There has been a 10 minute discussion about the side effects including but not limited to elevated blood pressure, anxiety, lack of sleep and dry mouth. Pt understands and will like to start/continue on appetite suppressant at this time. There will be one month RX given at the time of visit with proper follow up. Nova diet plan with restricted calories is given to the pt. Pt understands and agrees with  plan of treatment  This patient was seen by Leretha Pol FNP Collaboration with Dr [name] as a part of collaborative care agreement  Meds ordered this encounter  Medications  . ALPRAZolam (XANAX) 0.25 MG tablet    Sig: Take 1 tablet (0.25 mg total) by mouth 2 (two) times daily.     Dispense:  45 tablet    Refill:  3    Order Specific Question:   Supervising Provider    Answer:   [name] [[id number]]  . phentermine (ADIPEX-P) 37.5 MG tablet    Sig: Take 1 tablet (37.5 mg total) by mouth daily.    Dispense:  30 tablet    Refill:  1    Order Specific  Question:   Supervising Provider    Answer:   [name] [[id number]]    Time spent: 70 Minutes     Dr [name] Internal medicine

## 2018-06-29 ENCOUNTER — Other Ambulatory Visit: Payer: Self-pay | Admitting: Internal Medicine

## 2018-06-30 ENCOUNTER — Ambulatory Visit: Payer: Self-pay

## 2018-06-30 ENCOUNTER — Ambulatory Visit (INDEPENDENT_AMBULATORY_CARE_PROVIDER_SITE_OTHER): Payer: BLUE CROSS/BLUE SHIELD

## 2018-06-30 DIAGNOSIS — J301 Allergic rhinitis due to pollen: Secondary | ICD-10-CM

## 2018-07-06 ENCOUNTER — Other Ambulatory Visit: Payer: Self-pay | Admitting: Cardiovascular Disease

## 2018-07-07 ENCOUNTER — Ambulatory Visit (INDEPENDENT_AMBULATORY_CARE_PROVIDER_SITE_OTHER): Payer: Medicare Other | Admitting: Internal Medicine

## 2018-07-07 DIAGNOSIS — R0602 Shortness of breath: Secondary | ICD-10-CM

## 2018-07-07 DIAGNOSIS — J449 Chronic obstructive pulmonary disease, unspecified: Secondary | ICD-10-CM

## 2018-07-07 LAB — PULMONARY FUNCTION TEST

## 2018-07-08 ENCOUNTER — Ambulatory Visit: Payer: Self-pay | Admitting: Internal Medicine

## 2018-07-11 NOTE — Procedures (Signed)
[place], [address]  DATE OF SERVICE: July 07, 2018  Complete Pulmonary Function Testing Interpretation:  FINDINGS:   the forced vital capacity is normal.  The FEV1 is 1.79 L which is 79% predicted and is very mildly decreased.  The FEV1 FVC ratio is mildly decreased.  Post bronchodilator there is a significant improvement in the FEV1 by a 30%.  The total lung capacity is normal as measured by plethysmography.  Residual volume is increased residual volume total lung capacity ratio is increased FRC is normal.  The DLCO is increased  IMPRESSION:   this pulmonary function study is consistent with mild obstructive lung disease.  There does appear to be significant response to bronchodilators.  Clinical correlation is recommended  Allyne Gee, MD [facility] Pulmonary Critical Care Medicine Sleep Medicine

## 2018-07-12 ENCOUNTER — Other Ambulatory Visit: Payer: Self-pay | Admitting: Nurse Practitioner

## 2018-07-12 MED ORDER — SPIRONOLACTONE 25 MG PO TABS: 25.0000 mg | ORAL_TABLET | Freq: Two times a day (BID) | ORAL | 3 refills | Status: DC

## 2018-07-14 DIAGNOSIS — J4489 Other specified chronic obstructive pulmonary disease: Secondary | ICD-10-CM | POA: Insufficient documentation

## 2018-07-14 DIAGNOSIS — J449 Chronic obstructive pulmonary disease, unspecified: Secondary | ICD-10-CM | POA: Insufficient documentation

## 2018-07-19 ENCOUNTER — Ambulatory Visit (INDEPENDENT_AMBULATORY_CARE_PROVIDER_SITE_OTHER): Payer: BLUE CROSS/BLUE SHIELD | Admitting: Internal Medicine

## 2018-07-19 ENCOUNTER — Encounter: Payer: Self-pay | Admitting: Internal Medicine

## 2018-07-19 ENCOUNTER — Ambulatory Visit (INDEPENDENT_AMBULATORY_CARE_PROVIDER_SITE_OTHER): Payer: BLUE CROSS/BLUE SHIELD

## 2018-07-19 VITALS — BP 128/80 | HR 89 | Resp 16 | Ht 64.0 in | Wt 248.0 lb

## 2018-07-19 DIAGNOSIS — J449 Chronic obstructive pulmonary disease, unspecified: Secondary | ICD-10-CM | POA: Diagnosis not present

## 2018-07-19 DIAGNOSIS — G4733 Obstructive sleep apnea (adult) (pediatric): Secondary | ICD-10-CM

## 2018-07-19 DIAGNOSIS — J301 Allergic rhinitis due to pollen: Secondary | ICD-10-CM

## 2018-07-19 DIAGNOSIS — Z9989 Dependence on other enabling machines and devices: Secondary | ICD-10-CM

## 2018-07-19 NOTE — Patient Instructions (Signed)

## 2018-07-19 NOTE — Progress Notes (Signed)
[facility] [place], [place] [id number]  Pulmonary Sleep Medicine   Office Visit Note  Patient Name: Laura [name] DOB: 10/14/1951 MRN [id number]  Date of Service: 07/19/2018  Complaints/HPI: Overall doing OK. She was on vacation had a cold like illness. Was treated with abx and did well. She staets that she is now off the meds. Not on oxygen. She is doing well with her CPAP her compliance has been excellent.Her PFT was done shows good FEV1 and has been doing even better since she is over the illness. She had very good response to the broncho dilators  ROS  General: (-) fever, (-) chills, (-) night sweats, (-) weakness Skin: (-) rashes, (-) itching,. Eyes: (-) visual changes, (-) redness, (-) itching. Nose and Sinuses: (-) nasal stuffiness or itchiness, (-) postnasal drip, (-) nosebleeds, (-) sinus trouble. Mouth and Throat: (-) sore throat, (-) hoarseness. Neck: (-) swollen glands, (-) enlarged thyroid, (-) neck pain. Respiratory: - cough, (-) bloody sputum, + shortness of breath, - wheezing. Cardiovascular: - ankle swelling, (-) chest pain. Lymphatic: (-) lymph node enlargement. Neurologic: (-) numbness, (-) tingling. Psychiatric: (-) anxiety, (-) depression   Current Medication: Outpatient Encounter Medications as of 07/19/2018  Medication Sig  . ALPRAZolam (XANAX) 0.25 MG tablet Take 1 tablet (0.25 mg total) by mouth 2 (two) times daily.  Marland Kitchen dexlansoprazole (DEXILANT) 60 MG capsule Take 60 mg by mouth daily.   Marland Kitchen diltiazem (CARDIZEM CD) 360 MG 24 hr capsule TAKE 1 CAPSULE (360 MG TOTAL) BY MOUTH DAILY.  Marland Kitchen ELIQUIS 5 MG TABS tablet TAKE 1 TABLET (5 MG TOTAL) BY MOUTH 2 (TWO) TIMES DAILY.  Marland Kitchen EPINEPHrine 0.3 mg/0.3 mL IJ SOAJ injection Inject into the muscle.  . furosemide (LASIX) 40 MG tablet Take 1 tablet (40 mg total) by mouth daily.  Marland Kitchen guaiFENesin (MUCINEX) 600 MG 12 hr tablet Take 600 mg by mouth 2 (two) times daily as needed.  Marland Kitchen ibuprofen  (ADVIL,MOTRIN) 200 MG tablet Take by mouth.  . Ipratropium-Albuterol (COMBIVENT RESPIMAT) 20-100 MCG/ACT AERS respimat Inhale 1 puff into the lungs 4 (four) times daily as needed. For shortness of breath/wheezing.  . levalbuterol (XOPENEX) 0.31 MG/3ML nebulizer solution Take 3 mLs by nebulization every 4 (four) hours as needed. For wheezing/ and or shortness of breath.  . losartan (COZAAR) 25 MG tablet Take 1 tablet (25 mg total) by mouth daily.  . montelukast (SINGULAIR) 10 MG tablet TAKE 1 TABLET BY MOUTH EVERY DAY  . Multiple Vitamin (MULTIVITAMIN) tablet Take 1 tablet by mouth daily.  . phentermine (ADIPEX-P) 37.5 MG tablet Take 1 tablet (37.5 mg total) by mouth daily.  . predniSONE (DELTASONE) 10 MG tablet TAKE 1 TABLET BY MOUTH EVERY DAY  . Probiotic Product (PROBIOTIC-10) CAPS Take 1 capsule by mouth 1 day or 1 dose.  . spironolactone (ALDACTONE) 25 MG tablet Take 1 tablet (25 mg total) by mouth 2 (two) times daily.  Marland Kitchen THEO-24 100 MG 24 hr capsule TAKE ONE CAPSULE BY MOUTH DAILY  . theophylline (THEO-24) 300 MG 24 hr capsule Take 300 mg by mouth daily.   No facility-administered encounter medications on file as of 07/19/2018.     Surgical History: Past Surgical History:  Procedure Laterality Date  . INCISION AND DRAINAGE ABSCESS Right 06/29/2016   Procedure: INCISION AND DRAINAGE ABSCESS;  Surgeon: Florene Glen, MD;  Location: [facility] ORS;  Service: General;  Laterality: Right;  . INCISION AND DRAINAGE OF WOUND Left 06/29/2016   Procedure: IRRIGATION AND  DEBRIDEMENT WOUND;  Surgeon: Florene Glen, MD;  Location: [facility] ORS;  Service: General;  Laterality: Left;    Medical History: Past Medical History:  Diagnosis Date  . Asthma   . Chronic atrial fibrillation ([place])    a. diagnosed in 09/2016; b. failed flecainide and propafenone due to LE swelling and SOB, could not afford Multaq; c. CHADS2VASc => 3 (HTN, age x 1, female); d. on Eliquis  . GERD (gastroesophageal reflux disease)    . History of stress test    a. Lexiscan Myoview 10/2016: no evidence of ischemia, EF 53%  . Hypertension   . Obesity   . Obstructive sleep apnea   . Pulmonary hypertension ([place])   . Systolic dysfunction    a. TTE 10/2016: EF 50%, mild LVH, moderately dilated LA, moderate MR/TR, mild pulmonary hypertension    Family History: Family History  Problem Relation Age of Onset  . Dementia Mother   . Osteoporosis Mother   . Vascular Disease Mother   . COPD Father     Social History: Social History   Socioeconomic History  . Marital status: Married    Spouse name: Not on file  . Number of children: Not on file  . Years of education: Not on file  . Highest education level: Not on file  Occupational History  . Not on file  Social Needs  . Financial resource strain: Not on file  . Food insecurity:    Worry: Not on file    Inability: Not on file  . Transportation needs:    Medical: Not on file    Non-medical: Not on file  Tobacco Use  . Smoking status: Never Smoker  . Smokeless tobacco: Never Used  Substance and Sexual Activity  . Alcohol use: No  . Drug use: No  . Sexual activity: Not on file  Lifestyle  . Physical activity:    Days per week: Not on file    Minutes per session: Not on file  . Stress: Not on file  Relationships  . Social connections:    Talks on phone: Not on file    Gets together: Not on file    Attends religious service: Not on file    Active member of club or organization: Not on file    Attends meetings of clubs or organizations: Not on file    Relationship status: Not on file  . Intimate partner violence:    Fear of current or ex partner: Not on file    Emotionally abused: Not on file    Physically abused: Not on file    Forced sexual activity: Not on file  Other Topics Concern  . Not on file  Social History Narrative  . Not on file    Vital Signs: Blood pressure 128/80, pulse 89, resp. rate 16, height 5\' 4"  (1.626 m), weight 248 lb  (112.5 kg), SpO2 97 %.  Examination: General Appearance: The patient is well-developed, well-nourished, and in no distress. Skin: Gross inspection of skin unremarkable. Head: normocephalic, no gross deformities. Eyes: no gross deformities noted. ENT: ears appear grossly normal no exudates. Neck: Supple. No thyromegaly. No LAD. Respiratory: no rhonchi noted at this time. Cardiovascular: Normal S1 and S2 without murmur or rub. Extremities: No cyanosis. pulses are equal. Neurologic: Alert and oriented. No involuntary movements.  LABS: No results found for this or any previous visit (from the past 2160 hour(s)).  Radiology: US Venous Img Lower Unilateral Right  Result Date: 10/27/2016 CLINICAL DATA:  Pain  and swelling x1 week EXAM: RIGHT LOWER EXTREMITY VENOUS DOPPLER ULTRASOUND TECHNIQUE: Gray-scale sonography with compression, as well as color and duplex ultrasound, were performed to evaluate the deep venous system from the level of the common femoral vein through the popliteal and proximal calf veins. COMPARISON:  None FINDINGS: Normal compressibility of the common femoral, superficial femoral, and popliteal veins, as well as the proximal calf veins. No filling defects to suggest DVT on grayscale or color Doppler imaging. Doppler waveforms show normal direction of venous flow, normal respiratory phasicity and response to augmentation. Noncompressible thrombosed superficial and subcutaneous varicose veins in the proximal posterior calf. Survey views of the contralateral common femoral vein are unremarkable. IMPRESSION: 1. No evidence of  lower extremity deep vein thrombosis, right. 2. Proximal calf superficial thrombophlebitis. Electronically Signed   By: Lucrezia Europe M.D.   On: 10/27/2016 14:54    No results found.  No results found.    Assessment and Plan: Patient Active Problem List   Diagnosis Date Noted  . Obstructive chronic bronchitis without exacerbation ([place]) 07/14/2018  . Body  mass index (BMI) 40.0-44.9, adult ([place]) 03/17/2018  . Asthma 02/25/2018  . Generalized anxiety disorder 12/16/2017  . Essential (primary) hypertension 12/16/2017  . Chronic respiratory failure with hypoxia ([place]) 12/16/2017  . Long term (current) use of anticoagulants 12/16/2017  . Chronic atrial fibrillation ([place]) 12/16/2017  . Other specified functional intestinal disorders 12/16/2017  . Gastro-esophageal reflux disease without esophagitis 12/16/2017  . Obstructive sleep apnea of adult 12/16/2017  . Allergic rhinitis due to animal (cat) (dog) hair and dander 12/16/2017  . Allergic rhinitis due to pollen 12/16/2017  . Severe persistent asthma without complication [id number]/9629  . Cough 12/16/2017  . Shortness of breath 12/16/2017  . Superficial thrombophlebitis of right leg 11/17/2016  . Pain in limb 11/17/2016  . Chronic venous insufficiency 11/17/2016  . Swelling of limb 11/17/2016  . Cellulitis of buttock   . Cellulitis of right axilla   . Sepsis ([place]) 06/28/2016  . Cellulitis and abscess 06/28/2016  . Hypokalemia 06/28/2016  . Acute renal insufficiency 06/28/2016  . Hyperglycemia 06/28/2016  . Leukocytosis 06/28/2016  . Hypoxia 06/28/2016  . Collagenous colitis 05/13/2016    1. OSA on CPAP excellent compliance 2. Allergic Rhinitis controlled at this time has been on her shots every 2 weeks 3. COPD will improved. She takes combivent occasionally and is controlled 4. Morbid obesity needs to work on weight loss  General Counseling: I have discussed the findings of the evaluation and examination with [name].  I have also discussed any further diagnostic evaluation thatmay be needed or ordered today. Meily verbalizes understanding of the findings of todays visit. We also reviewed her medications today and discussed drug interactions and side effects including but not limited excessive drowsiness and altered mental states. We also discussed that there is always a risk not just to  her but also people around her. she has been encouraged to call the office with any questions or concerns that should arise related to todays visit.    Time spent: 75min  I have personally obtained a history, examined the patient, evaluated laboratory and imaging results, formulated the assessment and plan and placed orders.    Allyne Gee, MD [facility] Pulmonary and Critical Care Sleep medicine

## 2018-07-29 ENCOUNTER — Other Ambulatory Visit: Payer: Self-pay | Admitting: Internal Medicine

## 2018-07-30 ENCOUNTER — Encounter: Payer: Self-pay | Admitting: Nurse Practitioner

## 2018-07-30 ENCOUNTER — Ambulatory Visit (INDEPENDENT_AMBULATORY_CARE_PROVIDER_SITE_OTHER): Payer: BLUE CROSS/BLUE SHIELD | Admitting: Nurse Practitioner

## 2018-07-30 VITALS — BP 130/80 | HR 78 | Resp 16 | Ht 64.0 in | Wt 250.0 lb

## 2018-07-30 DIAGNOSIS — Z6841 Body Mass Index (BMI) 40.0 and over, adult: Secondary | ICD-10-CM | POA: Diagnosis not present

## 2018-07-30 DIAGNOSIS — I10 Essential (primary) hypertension: Secondary | ICD-10-CM | POA: Diagnosis not present

## 2018-07-30 DIAGNOSIS — Z1231 Encounter for screening mammogram for malignant neoplasm of breast: Secondary | ICD-10-CM

## 2018-07-30 DIAGNOSIS — Z1239 Encounter for other screening for malignant neoplasm of breast: Secondary | ICD-10-CM

## 2018-07-30 DIAGNOSIS — Z23 Encounter for immunization: Secondary | ICD-10-CM

## 2018-07-30 DIAGNOSIS — Z0001 Encounter for general adult medical examination with abnormal findings: Secondary | ICD-10-CM

## 2018-07-30 DIAGNOSIS — E559 Vitamin D deficiency, unspecified: Secondary | ICD-10-CM

## 2018-07-30 DIAGNOSIS — J209 Acute bronchitis, unspecified: Secondary | ICD-10-CM

## 2018-07-30 DIAGNOSIS — J42 Unspecified chronic bronchitis: Secondary | ICD-10-CM

## 2018-07-30 MED ORDER — PHENTERMINE HCL 37.5 MG PO TABS: 37.5000 mg | ORAL_TABLET | Freq: Every day | ORAL | 1 refills | Status: DC

## 2018-07-30 MED ORDER — LEVOFLOXACIN 500 MG PO TABS: 500.0000 mg | ORAL_TABLET | Freq: Every day | ORAL | 0 refills | Status: DC

## 2018-07-30 MED ORDER — PREDNISONE 10 MG (21) PO TBPK: ORAL_TABLET | ORAL | 0 refills | Status: DC

## 2018-07-30 NOTE — Progress Notes (Signed)
[facility] [place], [place] [address]  Internal MEDICINE  Office Visit Note  Patient Name: Laura [name]  [id number]  [id number]  Date of Service: 08/15/2018   Pt is here for routine health maintenance examination   Chief Complaint  Patient presents with  . Hypertension  . Cough  . Medicare Wellness     Cough  This is a recurrent problem. The current episode started in the past 7 days. The problem has been waxing and waning. The problem occurs every few minutes. The cough is productive of sputum. Associated symptoms include headaches, nasal congestion, postnasal drip, shortness of breath and wheezing. Pertinent negatives include no chest pain, myalgias, rash or rhinorrhea. The symptoms are aggravated by exercise, stress and pollens. She has tried a beta-agonist inhaler, rest, steroid inhaler and leukotriene antagonists for the symptoms. The treatment provided mild relief. Her past medical history is significant for asthma, COPD and environmental allergies.    Current Medication: Outpatient Encounter Medications as of 07/30/2018  Medication Sig  . ALPRAZolam (XANAX) 0.25 MG tablet Take 1 tablet (0.25 mg total) by mouth 2 (two) times daily.  Marland Kitchen dexlansoprazole (DEXILANT) 60 MG capsule Take 60 mg by mouth daily.   Marland Kitchen diltiazem (CARDIZEM CD) 360 MG 24 hr capsule TAKE 1 CAPSULE (360 MG TOTAL) BY MOUTH DAILY.  Marland Kitchen ELIQUIS 5 MG TABS tablet TAKE 1 TABLET (5 MG TOTAL) BY MOUTH 2 (TWO) TIMES DAILY.  Marland Kitchen EPINEPHrine 0.3 mg/0.3 mL IJ SOAJ injection Inject into the muscle.  . furosemide (LASIX) 40 MG tablet Take 1 tablet (40 mg total) by mouth daily.  Marland Kitchen guaiFENesin (MUCINEX) 600 MG 12 hr tablet Take 600 mg by mouth 2 (two) times daily as needed.  Marland Kitchen ibuprofen (ADVIL,MOTRIN) 200 MG tablet Take by mouth.  . Ipratropium-Albuterol (COMBIVENT RESPIMAT) 20-100 MCG/ACT AERS respimat Inhale 1 puff into the lungs 4 (four) times daily as needed. For shortness of breath/wheezing.   . levalbuterol (XOPENEX) 0.31 MG/3ML nebulizer solution Take 3 mLs by nebulization every 4 (four) hours as needed. For wheezing/ and or shortness of breath.  . losartan (COZAAR) 25 MG tablet Take 1 tablet (25 mg total) by mouth daily.  . montelukast (SINGULAIR) 10 MG tablet TAKE 1 TABLET BY MOUTH EVERY DAY  . Multiple Vitamin (MULTIVITAMIN) tablet Take 1 tablet by mouth daily.  . phentermine (ADIPEX-P) 37.5 MG tablet Take 1 tablet (37.5 mg total) by mouth daily.  . Probiotic Product (PROBIOTIC-10) CAPS Take 1 capsule by mouth 1 day or 1 dose.  . spironolactone (ALDACTONE) 25 MG tablet Take 1 tablet (25 mg total) by mouth 2 (two) times daily.  Marland Kitchen THEO-24 100 MG 24 hr capsule TAKE ONE CAPSULE BY MOUTH DAILY  . theophylline (THEO-24) 300 MG 24 hr capsule Take 300 mg by mouth daily.  . [DISCONTINUED] phentermine (ADIPEX-P) 37.5 MG tablet Take 1 tablet (37.5 mg total) by mouth daily.  . [DISCONTINUED] predniSONE (DELTASONE) 10 MG tablet TAKE 1 TABLET BY MOUTH EVERY DAY  . levofloxacin (LEVAQUIN) 500 MG tablet Take 1 tablet (500 mg total) by mouth daily.  . predniSONE (STERAPRED UNI-PAK 21 TAB) 10 MG (21) TBPK tablet 6 day taper - take by mouth as directed for 6 days   No facility-administered encounter medications on file as of 07/30/2018.     Surgical History: Past Surgical History:  Procedure Laterality Date  . INCISION AND DRAINAGE ABSCESS Right 06/29/2016   Procedure: INCISION AND DRAINAGE ABSCESS;  Surgeon: Florene Glen, MD;  Location:  [facility] ORS;  Service: General;  Laterality: Right;  . INCISION AND DRAINAGE OF WOUND Left 06/29/2016   Procedure: IRRIGATION AND DEBRIDEMENT WOUND;  Surgeon: Florene Glen, MD;  Location: [facility] ORS;  Service: General;  Laterality: Left;    Medical History: Past Medical History:  Diagnosis Date  . Asthma   . Chronic atrial fibrillation ([place])    a. diagnosed in 09/2016; b. failed flecainide and propafenone due to LE swelling and SOB, could not afford  Multaq; c. CHADS2VASc => 3 (HTN, age x 1, female); d. on Eliquis  . GERD (gastroesophageal reflux disease)   . History of stress test    a. Lexiscan Myoview 10/2016: no evidence of ischemia, EF 53%  . Hypertension   . Obesity   . Obstructive sleep apnea   . Pulmonary hypertension ([place])   . Systolic dysfunction    a. TTE 10/2016: EF 50%, mild LVH, moderately dilated LA, moderate MR/TR, mild pulmonary hypertension    Family History: Family History  Problem Relation Age of Onset  . Dementia Mother   . Osteoporosis Mother   . Vascular Disease Mother   . COPD Father       Review of Systems  Constitutional: Negative for activity change, fatigue and unexpected weight change.       Weight gain of two pounds since her last visit.   HENT: Positive for congestion, postnasal drip and sinus pressure. Negative for rhinorrhea and sinus pain.   Eyes: Negative.   Respiratory: Positive for cough, shortness of breath and wheezing.        Worsening wheezing and cough over past several days.  Cardiovascular: Positive for palpitations. Negative for chest pain.  Gastrointestinal: Negative for diarrhea, nausea and vomiting.  Endocrine: Negative for cold intolerance, heat intolerance, polydipsia, polyphagia and polyuria.  Genitourinary: Negative.   Musculoskeletal: Negative for arthralgias, back pain and myalgias.  Skin: Negative for rash.  Allergic/Immunologic: Positive for environmental allergies.  Neurological: Positive for headaches. Negative for dizziness and weakness.  Hematological: Positive for adenopathy.  Psychiatric/Behavioral:       Intermittent anxiety which is well controlled.      Vital Signs: BP 130/80   Pulse 78   Resp 16   Ht 5\' 4"  (1.626 m)   Wt 250 lb (113.4 kg)   SpO2 98%   BMI 42.91 kg/m    Physical Exam  Constitutional: She is oriented to person, place, and time. Vital signs are normal. She appears well-developed and well-nourished. She is active.  Moderate  obesity  HENT:  Head: Normocephalic and atraumatic.  Right Ear: Tympanic membrane and external ear normal.  Left Ear: Tympanic membrane and external ear normal.  Nose: Nose normal. No rhinorrhea or sinus tenderness. Right sinus exhibits no maxillary sinus tenderness and no frontal sinus tenderness. Left sinus exhibits no maxillary sinus tenderness and no frontal sinus tenderness.  Mouth/Throat: Oropharynx is clear and moist.  Eyes: Pupils are equal, round, and reactive to light. Conjunctivae and EOM are normal.  Neck: Normal range of motion. Neck supple. No JVD present. No tracheal deviation present. No thyromegaly present.  Cardiovascular: Intact distal pulses. An irregularly irregular rhythm present. Frequent extrasystoles are present. Tachycardia present.  Murmur heard. Pulmonary/Chest: Effort normal. No accessory muscle usage. No respiratory distress. She has wheezes. Right breast exhibits no inverted nipple, no mass, no nipple discharge, no skin change and no tenderness. Left breast exhibits no inverted nipple, no mass, no nipple discharge, no skin change and no tenderness.  Congested and productive  cough present.   Abdominal: Soft. Bowel sounds are normal. There is no tenderness.  Musculoskeletal: Normal range of motion.  Lymphadenopathy:    She has cervical adenopathy.  Neurological: She is alert and oriented to person, place, and time.  Skin: Skin is warm and dry.  Psychiatric: She has a normal mood and affect. Her behavior is normal. Judgment and thought content normal.  Nursing note and vitals reviewed.    LABS: Recent Results (from the past 2160 hour(s))  Pulmonary function test     Status: None   Collection Time: 07/07/18  9:00 AM  Result Value Ref Range   FEV1  liters   FVC  liters   FEV1/FVC  %   TLC     DLCO  ml/mmHg sec  UA/M w/rflx Culture, Routine     Status: None   Collection Time: 07/30/18  3:30 PM  Result Value Ref Range   Specific Gravity, UA 1.006 1.005 -  1.030   pH, UA 5.5 5.0 - 7.5   Color, UA Yellow Yellow   Appearance Ur Clear Clear   Leukocytes, UA Negative Negative   Protein, UA Negative Negative/Trace   Glucose, UA Negative Negative   Ketones, UA Negative Negative   RBC, UA Negative Negative   Bilirubin, UA Negative Negative   Urobilinogen, Ur 0.2 0.2 - 1.0 mg/dL   Nitrite, UA Negative Negative   Microscopic Examination Comment     Comment: Microscopic follows if indicated.   Microscopic Examination See below:     Comment: Microscopic was indicated and was performed.   Urinalysis Reflex Comment     Comment: This specimen will not reflex to a Urine Culture.  Microscopic Examination     Status: None   Collection Time: 07/30/18  3:30 PM  Result Value Ref Range   WBC, UA None seen 0 - 5 /hpf   RBC, UA 0-2 0 - 2 /hpf   Epithelial Cells (non renal) 0-10 0 - 10 /hpf   Casts None seen None seen /lpf   Bacteria, UA None seen None seen/Few    Assessment/Plan: 1. Encounter for general adult medical examination with abnormal findings Annual health maintenance exam - UA/M w/rflx Culture, Routine - Comprehensive metabolic panel - T4, free - TSH  2. Chronic bronchitis with acute exacerbation ([place]) Start levaquin 500mg  daily for 10 days. Add prednisone dose pack. Take as directed for 6 days. Use inhalers and respiratory medications as prescribed.  - predniSONE (STERAPRED UNI-PAK 21 TAB) 10 MG (21) TBPK tablet; 6 day taper - take by mouth as directed for 6 days  Dispense: 21 tablet; Refill: 0 - levofloxacin (LEVAQUIN) 500 MG tablet; Take 1 tablet (500 mg total) by mouth daily.  Dispense: 10 tablet; Refill: 0  3. Essential (primary) hypertension Stable. Continue bp medication as prescribed  - CBC with Differential/Platelet - Comprehensive metabolic panel - T4, free - TSH - Lipid panel  4. Body mass index (BMI) 40.0-44.9, adult ([facility]) Continue phentermine 37.5mg  daily. Recommend she limit her calorie intake to 1500 calories per  day and to gradually increase her physical activity once feeling better.  - phentermine (ADIPEX-P) 37.5 MG tablet; Take 1 tablet (37.5 mg total) by mouth daily.  Dispense: 30 tablet; Refill: 1  5. Vitamin D deficiency - Vitamin D 1,25 dihydroxy  6. Screening for breast cancer - MM DIGITAL SCREENING BILATERAL; Future  7. Need for vaccination against Streptococcus pneumoniae using pneumococcal conjugate vaccine 13 - Pneumococcal conjugate vaccine 13-valent IM  General Counseling: [name]  verbalizes understanding of the findings of todays visit and agrees with plan of treatment. I have discussed any further diagnostic evaluation that may be needed or ordered today. We also reviewed her medications today. she has been encouraged to call the office with any questions or concerns that should arise related to todays visit.    Counseling:  Rest and increase fluids. Continue using OTC medication to control symptoms.   This patient was seen by Leretha Pol FNP Collaboration with Dr [name] as a part of collaborative care agreement  Orders Placed This Encounter  Procedures  . Microscopic Examination  . MM DIGITAL SCREENING BILATERAL  . Pneumococcal conjugate vaccine 13-valent IM  . UA/M w/rflx Culture, Routine  . CBC with Differential/Platelet  . Comprehensive metabolic panel  . T4, free  . TSH  . Lipid panel  . Vitamin D 1,25 dihydroxy    Meds ordered this encounter  Medications  . predniSONE (STERAPRED UNI-PAK 21 TAB) 10 MG (21) TBPK tablet    Sig: 6 day taper - take by mouth as directed for 6 days    Dispense:  21 tablet    Refill:  0    Order Specific Question:   Supervising Provider    Answer:   [name] [place]  . levofloxacin (LEVAQUIN) 500 MG tablet    Sig: Take 1 tablet (500 mg total) by mouth daily.    Dispense:  10 tablet    Refill:  0    Order Specific Question:   Supervising Provider    Answer:   [name] [[id number]]  . phentermine (ADIPEX-P) 37.5 MG  tablet    Sig: Take 1 tablet (37.5 mg total) by mouth daily.    Dispense:  30 tablet    Refill:  1    Order Specific Question:   Supervising Provider    Answer:   [name] [[id number]]    Time spent: Annabella, MD  Internal Medicine

## 2018-07-31 LAB — UA/M W/RFLX CULTURE, ROUTINE
Bilirubin, UA: NEGATIVE
Glucose, UA: NEGATIVE
KETONES UA: NEGATIVE
LEUKOCYTES UA: NEGATIVE
NITRITE UA: NEGATIVE
PH UA: 5.5 (ref 5.0–7.5)
Protein, UA: NEGATIVE
RBC UA: NEGATIVE
Specific Gravity, UA: 1.006 (ref 1.005–1.030)
UUROB: 0.2 mg/dL (ref 0.2–1.0)

## 2018-07-31 LAB — MICROSCOPIC EXAMINATION
BACTERIA UA: NONE SEEN
CASTS: NONE SEEN /LPF
WBC, UA: NONE SEEN /hpf (ref 0–5)

## 2018-08-02 ENCOUNTER — Ambulatory Visit (INDEPENDENT_AMBULATORY_CARE_PROVIDER_SITE_OTHER): Payer: BLUE CROSS/BLUE SHIELD

## 2018-08-02 DIAGNOSIS — J301 Allergic rhinitis due to pollen: Secondary | ICD-10-CM

## 2018-08-15 ENCOUNTER — Other Ambulatory Visit: Payer: Self-pay | Admitting: Cardiovascular Disease

## 2018-08-15 DIAGNOSIS — E559 Vitamin D deficiency, unspecified: Secondary | ICD-10-CM | POA: Insufficient documentation

## 2018-08-15 DIAGNOSIS — Z1239 Encounter for other screening for malignant neoplasm of breast: Secondary | ICD-10-CM | POA: Insufficient documentation

## 2018-08-15 DIAGNOSIS — Z23 Encounter for immunization: Secondary | ICD-10-CM | POA: Insufficient documentation

## 2018-08-15 DIAGNOSIS — J42 Unspecified chronic bronchitis: Secondary | ICD-10-CM

## 2018-08-15 DIAGNOSIS — J209 Acute bronchitis, unspecified: Secondary | ICD-10-CM | POA: Insufficient documentation

## 2018-08-16 NOTE — Telephone Encounter (Signed)
Please review for refill, Thanks !  

## 2018-08-24 ENCOUNTER — Ambulatory Visit: Payer: Self-pay

## 2018-09-10 ENCOUNTER — Ambulatory Visit (INDEPENDENT_AMBULATORY_CARE_PROVIDER_SITE_OTHER): Payer: BLUE CROSS/BLUE SHIELD | Admitting: Nurse Practitioner

## 2018-09-10 ENCOUNTER — Encounter: Payer: Self-pay | Admitting: Nurse Practitioner

## 2018-09-10 VITALS — BP 145/62 | HR 64 | Resp 16 | Ht 64.0 in | Wt 252.4 lb

## 2018-09-10 DIAGNOSIS — J449 Chronic obstructive pulmonary disease, unspecified: Secondary | ICD-10-CM | POA: Diagnosis not present

## 2018-09-10 DIAGNOSIS — E559 Vitamin D deficiency, unspecified: Secondary | ICD-10-CM | POA: Diagnosis not present

## 2018-09-10 DIAGNOSIS — Z0001 Encounter for general adult medical examination with abnormal findings: Secondary | ICD-10-CM | POA: Diagnosis not present

## 2018-09-10 DIAGNOSIS — Z6841 Body Mass Index (BMI) 40.0 and over, adult: Secondary | ICD-10-CM

## 2018-09-10 DIAGNOSIS — I10 Essential (primary) hypertension: Secondary | ICD-10-CM

## 2018-09-10 DIAGNOSIS — R5381 Other malaise: Secondary | ICD-10-CM | POA: Diagnosis not present

## 2018-09-10 DIAGNOSIS — Z13220 Encounter for screening for lipoid disorders: Secondary | ICD-10-CM | POA: Diagnosis not present

## 2018-09-10 DIAGNOSIS — R5383 Other fatigue: Secondary | ICD-10-CM | POA: Diagnosis not present

## 2018-09-10 MED ORDER — PHENTERMINE HCL 37.5 MG PO TABS: 37.5000 mg | ORAL_TABLET | Freq: Every day | ORAL | 1 refills | Status: DC

## 2018-09-10 NOTE — Progress Notes (Signed)
[facility] [place], [place] [address]  Internal MEDICINE  Office Visit Note  Patient Name: Laura [name]  [id number]  [id number]  Date of Service: 09/10/2018  Chief Complaint  Patient presents with  . Medical Management of Chronic Issues    6wk follow up weight management.    The patient has had a two pound weight gain since her last visit. She had some major job changes. Caused her to be very depressed. Was not doing well with her diet and not exercising.this was two weeks ago. This past week was much better. She is making a new routine and has returned to better eating habits. She is walking as form of exercise. This is the first set back in her weight in a long time.      Current Medication: Outpatient Encounter Medications as of 09/10/2018  Medication Sig  . ALPRAZolam (XANAX) 0.25 MG tablet Take 1 tablet (0.25 mg total) by mouth 2 (two) times daily.  Marland Kitchen dexlansoprazole (DEXILANT) 60 MG capsule Take 60 mg by mouth daily.   Marland Kitchen diltiazem (CARDIZEM CD) 360 MG 24 hr capsule TAKE 1 CAPSULE (360 MG TOTAL) BY MOUTH DAILY.  Marland Kitchen ELIQUIS 5 MG TABS tablet TAKE 1 TABLET (5 MG TOTAL) BY MOUTH 2 (TWO) TIMES DAILY.  Marland Kitchen EPINEPHrine 0.3 mg/0.3 mL IJ SOAJ injection Inject into the muscle.  . furosemide (LASIX) 40 MG tablet Take 1 tablet (40 mg total) by mouth daily.  Marland Kitchen guaiFENesin (MUCINEX) 600 MG 12 hr tablet Take 600 mg by mouth 2 (two) times daily as needed.  Marland Kitchen ibuprofen (ADVIL,MOTRIN) 200 MG tablet Take by mouth.  . Ipratropium-Albuterol (COMBIVENT RESPIMAT) 20-100 MCG/ACT AERS respimat Inhale 1 puff into the lungs 4 (four) times daily as needed. For shortness of breath/wheezing.  . levalbuterol (XOPENEX) 0.31 MG/3ML nebulizer solution Take 3 mLs by nebulization every 4 (four) hours as needed. For wheezing/ and or shortness of breath.  . levofloxacin (LEVAQUIN) 500 MG tablet Take 1 tablet (500 mg total) by mouth daily.  Marland Kitchen losartan (COZAAR) 25 MG tablet Take 1 tablet  (25 mg total) by mouth daily.  . montelukast (SINGULAIR) 10 MG tablet TAKE 1 TABLET BY MOUTH EVERY DAY  . Multiple Vitamin (MULTIVITAMIN) tablet Take 1 tablet by mouth daily.  . phentermine (ADIPEX-P) 37.5 MG tablet Take 1 tablet (37.5 mg total) by mouth daily.  . Probiotic Product (PROBIOTIC-10) CAPS Take 1 capsule by mouth 1 day or 1 dose.  . spironolactone (ALDACTONE) 25 MG tablet Take 1 tablet (25 mg total) by mouth 2 (two) times daily.  Marland Kitchen THEO-24 100 MG 24 hr capsule TAKE ONE CAPSULE BY MOUTH DAILY  . theophylline (THEO-24) 300 MG 24 hr capsule Take 300 mg by mouth daily.  . [DISCONTINUED] phentermine (ADIPEX-P) 37.5 MG tablet Take 1 tablet (37.5 mg total) by mouth daily.  . predniSONE (STERAPRED UNI-PAK 21 TAB) 10 MG (21) TBPK tablet 6 day taper - take by mouth as directed for 6 days (Patient not taking: Reported on 09/10/2018)   No facility-administered encounter medications on file as of 09/10/2018.     Surgical History: Past Surgical History:  Procedure Laterality Date  . INCISION AND DRAINAGE ABSCESS Right 06/29/2016   Procedure: INCISION AND DRAINAGE ABSCESS;  Surgeon: Florene Glen, MD;  Location: [facility] ORS;  Service: General;  Laterality: Right;  . INCISION AND DRAINAGE OF WOUND Left 06/29/2016   Procedure: IRRIGATION AND DEBRIDEMENT WOUND;  Surgeon: Florene Glen, MD;  Location: [facility] ORS;  Service: General;  Laterality: Left;    Medical History: Past Medical History:  Diagnosis Date  . Asthma   . Chronic atrial fibrillation ([place])    a. diagnosed in 09/2016; b. failed flecainide and propafenone due to LE swelling and SOB, could not afford Multaq; c. CHADS2VASc => 3 (HTN, age x 1, female); d. on Eliquis  . GERD (gastroesophageal reflux disease)   . History of stress test    a. Lexiscan Myoview 10/2016: no evidence of ischemia, EF 53%  . Hypertension   . Obesity   . Obstructive sleep apnea   . Pulmonary hypertension ([place])   . Systolic dysfunction    a. TTE 10/2016: EF  50%, mild LVH, moderately dilated LA, moderate MR/TR, mild pulmonary hypertension    Family History: Family History  Problem Relation Age of Onset  . Dementia Mother   . Osteoporosis Mother   . Vascular Disease Mother   . COPD Father     Social History   Socioeconomic History  . Marital status: Married    Spouse name: Not on file  . Number of children: Not on file  . Years of education: Not on file  . Highest education level: Not on file  Occupational History  . Not on file  Social Needs  . Financial resource strain: Not on file  . Food insecurity:    Worry: Not on file    Inability: Not on file  . Transportation needs:    Medical: Not on file    Non-medical: Not on file  Tobacco Use  . Smoking status: Never Smoker  . Smokeless tobacco: Never Used  Substance and Sexual Activity  . Alcohol use: No  . Drug use: No  . Sexual activity: Not on file  Lifestyle  . Physical activity:    Days per week: Not on file    Minutes per session: Not on file  . Stress: Not on file  Relationships  . Social connections:    Talks on phone: Not on file    Gets together: Not on file    Attends religious service: Not on file    Active member of club or organization: Not on file    Attends meetings of clubs or organizations: Not on file    Relationship status: Not on file  . Intimate partner violence:    Fear of current or ex partner: Not on file    Emotionally abused: Not on file    Physically abused: Not on file    Forced sexual activity: Not on file  Other Topics Concern  . Not on file  Social History Narrative  . Not on file      Review of Systems  Constitutional: Negative for activity change, fatigue and unexpected weight change.       Weight gain of two pounds since her last visit.   HENT: Negative for congestion, postnasal drip, rhinorrhea, sinus pressure and sinus pain.   Eyes: Negative.   Respiratory: Negative for cough, shortness of breath and wheezing.         Worsening wheezing and cough over past several days.  Cardiovascular: Positive for palpitations. Negative for chest pain.  Gastrointestinal: Negative for diarrhea, nausea and vomiting.  Endocrine: Negative for cold intolerance, heat intolerance, polydipsia, polyphagia and polyuria.  Genitourinary: Negative.   Musculoskeletal: Negative for arthralgias, back pain and myalgias.  Skin: Negative for rash.  Allergic/Immunologic: Positive for environmental allergies.  Neurological: Negative for dizziness, weakness and headaches.  Hematological: Negative for adenopathy.  Psychiatric/Behavioral:       Intermittent anxiety which is well controlled.     Today's Vitals   09/10/18 0909  BP: (!) 145/62  Pulse: 64  Resp: 16  SpO2: 97%  Weight: 252 lb 6.4 oz (114.5 kg)  Height: 5\' 4"  (1.626 m)    Physical Exam  Constitutional: She is oriented to person, place, and time. Vital signs are normal. She appears well-developed and well-nourished. She is active.  Moderate obesity  HENT:  Head: Normocephalic and atraumatic.  Right Ear: Tympanic membrane normal.  Left Ear: Tympanic membrane normal.  Nose: Nose normal. No rhinorrhea or sinus tenderness. Right sinus exhibits no maxillary sinus tenderness and no frontal sinus tenderness. Left sinus exhibits no maxillary sinus tenderness and no frontal sinus tenderness.  Mouth/Throat: Oropharynx is clear and moist.  Eyes: Pupils are equal, round, and reactive to light. Conjunctivae and EOM are normal.  Neck: Normal range of motion. Neck supple. No JVD present. No tracheal deviation present. No thyromegaly present.  Cardiovascular: Intact distal pulses. An irregularly irregular rhythm present. Frequent extrasystoles are present. Tachycardia present.  Murmur heard. Pulmonary/Chest: Effort normal and breath sounds normal. No accessory muscle usage. No respiratory distress. She has no wheezes. Right breast exhibits no inverted nipple, no mass, no nipple  discharge, no skin change and no tenderness. Left breast exhibits no inverted nipple, no mass, no nipple discharge, no skin change and no tenderness.  Abdominal: Soft. Bowel sounds are normal. There is no tenderness.  Musculoskeletal: Normal range of motion.  Lymphadenopathy:    She has no cervical adenopathy.  Neurological: She is alert and oriented to person, place, and time.  Skin: Skin is warm and dry.  Psychiatric: She has a normal mood and affect. Her behavior is normal. Judgment and thought content normal.  Nursing note and vitals reviewed.  Assessment/Plan: 1. Obstructive chronic bronchitis without exacerbation ([place]) Doing well. Continue inhalers and respiratory medications as prescribed  2. Essential (primary) hypertension Stable. Continue bp medication as prescribed   3. Body mass index (BMI) 40.0-44.9, adult (HCC) Small weight gain due to stress and overeating. Continue phentermine 37.5mg  tablets daily. Encouraged a 1500 calorie diet and gradually advance routine exercise.  - phentermine (ADIPEX-P) 37.5 MG tablet; Take 1 tablet (37.5 mg total) by mouth daily.  Dispense: 30 tablet; Refill: 1   General Counseling: Izabel verbalizes understanding of the findings of todays visit and agrees with plan of treatment. I have discussed any further diagnostic evaluation that may be needed or ordered today. We also reviewed her medications today. she has been encouraged to call the office with any questions or concerns that should arise related to todays visit.  Obesity Counseling: Risk Assessment: An assessment of behavioral risk factors was made today and includes lack of exercise sedentary lifestyle, lack of portion control and poor dietary habits.  Risk Modification Advice: She was counseled on portion control guidelines. Restricting daily caloric intake to 1500. The detrimental long term effects of obesity on her health and ongoing poor compliance was also discussed with the  patient.    Meds ordered this encounter  Medications  . phentermine (ADIPEX-P) 37.5 MG tablet    Sig: Take 1 tablet (37.5 mg total) by mouth daily.    Dispense:  30 tablet    Refill:  1    Order Specific Question:   Supervising Provider    Answer:   [name] [[id number]]    Time spent: 71 Minutes      Dr Latricia Heft  [facility] Internal medicine

## 2018-09-16 LAB — COMPREHENSIVE METABOLIC PANEL
ALT: 15 IU/L (ref 0–32)
AST: 15 IU/L (ref 0–40)
Albumin/Globulin Ratio: 1.7 (ref 1.2–2.2)
Albumin: 4 g/dL (ref 3.6–4.8)
Alkaline Phosphatase: 59 IU/L (ref 39–117)
BUN/Creatinine Ratio: 22 (ref 12–28)
BUN: 22 mg/dL (ref 8–27)
Bilirubin Total: 0.6 mg/dL (ref 0.0–1.2)
CALCIUM: 9.4 mg/dL (ref 8.7–10.3)
CHLORIDE: 100 mmol/L (ref 96–106)
CO2: 25 mmol/L (ref 20–29)
CREATININE: 1.01 mg/dL — AB (ref 0.57–1.00)
GFR calc Af Amer: 66 mL/min/{1.73_m2} (ref >59)
GFR, EST NON AFRICAN AMERICAN: 57 mL/min/{1.73_m2} — AB (ref >59)
GLUCOSE: 88 mg/dL (ref 65–99)
Globulin, Total: 2.4 g/dL (ref 1.5–4.5)
Potassium: 5 mmol/L (ref 3.5–5.2)
Sodium: 140 mmol/L (ref 134–144)
Total Protein: 6.4 g/dL (ref 6.0–8.5)

## 2018-09-16 LAB — CBC WITH DIFFERENTIAL/PLATELET
BASOS: 1 %
Basophils Absolute: 0.1 10*3/uL (ref 0.0–0.2)
EOS (ABSOLUTE): 0.4 10*3/uL (ref 0.0–0.4)
EOS: 5 %
HEMATOCRIT: 41.3 % (ref 34.0–46.6)
HEMOGLOBIN: 13.8 g/dL (ref 11.1–15.9)
Immature Grans (Abs): 0 10*3/uL (ref 0.0–0.1)
Immature Granulocytes: 1 %
LYMPHS ABS: 3.6 10*3/uL — AB (ref 0.7–3.1)
Lymphs: 46 %
MCH: 30.7 pg (ref 26.6–33.0)
MCHC: 33.4 g/dL (ref 31.5–35.7)
MCV: 92 fL (ref 79–97)
MONOCYTES: 10 %
MONOS ABS: 0.8 10*3/uL (ref 0.1–0.9)
Neutrophils Absolute: 2.9 10*3/uL (ref 1.4–7.0)
Neutrophils: 37 %
PLATELETS: 310 10*3/uL (ref 150–450)
RBC: 4.5 x10E6/uL (ref 3.77–5.28)
RDW: 13.2 % (ref 12.3–15.4)
WBC: 7.7 10*3/uL (ref 3.4–10.8)

## 2018-09-16 LAB — VITAMIN D 1,25 DIHYDROXY
VITAMIN D 1, 25 (OH) TOTAL: 28 pg/mL
VITAMIN D3 1, 25 (OH): 28 pg/mL

## 2018-09-16 LAB — TSH: TSH: 3.93 u[IU]/mL (ref 0.450–4.500)

## 2018-09-16 LAB — T4, FREE: Free T4: 1.25 ng/dL (ref 0.82–1.77)

## 2018-09-16 LAB — LIPID PANEL
Chol/HDL Ratio: 3.4 ratio (ref 0.0–4.4)
Cholesterol, Total: 189 mg/dL (ref 100–199)
HDL: 55 mg/dL (ref >39)
LDL CALC: 113 mg/dL — AB (ref 0–99)
Triglycerides: 104 mg/dL (ref 0–149)
VLDL Cholesterol Cal: 21 mg/dL (ref 5–40)

## 2018-09-29 ENCOUNTER — Other Ambulatory Visit: Payer: Self-pay | Admitting: Internal Medicine

## 2018-10-22 ENCOUNTER — Ambulatory Visit: Payer: Self-pay | Admitting: Adult Health

## 2018-10-22 ENCOUNTER — Other Ambulatory Visit: Payer: Self-pay | Admitting: Nurse Practitioner

## 2018-10-23 ENCOUNTER — Other Ambulatory Visit: Payer: Self-pay | Admitting: Nurse Practitioner

## 2018-10-30 ENCOUNTER — Other Ambulatory Visit: Payer: Self-pay | Admitting: Nurse Practitioner

## 2018-11-01 ENCOUNTER — Ambulatory Visit (INDEPENDENT_AMBULATORY_CARE_PROVIDER_SITE_OTHER): Payer: BLUE CROSS/BLUE SHIELD | Admitting: Adult Health

## 2018-11-01 ENCOUNTER — Encounter: Payer: Self-pay | Admitting: Adult Health

## 2018-11-01 VITALS — BP 142/72 | HR 60 | Resp 16 | Ht 64.0 in | Wt 258.0 lb

## 2018-11-01 DIAGNOSIS — J4550 Severe persistent asthma, uncomplicated: Secondary | ICD-10-CM

## 2018-11-01 DIAGNOSIS — Z6841 Body Mass Index (BMI) 40.0 and over, adult: Secondary | ICD-10-CM

## 2018-11-01 DIAGNOSIS — I10 Essential (primary) hypertension: Secondary | ICD-10-CM

## 2018-11-01 DIAGNOSIS — R05 Cough: Secondary | ICD-10-CM | POA: Diagnosis not present

## 2018-11-01 DIAGNOSIS — R059 Cough, unspecified: Secondary | ICD-10-CM

## 2018-11-01 MED ORDER — PHENTERMINE HCL 37.5 MG PO TABS: 37.5000 mg | ORAL_TABLET | Freq: Every day | ORAL | 0 refills | Status: DC

## 2018-11-01 NOTE — Progress Notes (Signed)
[facility] [place], [place] [address]  Internal MEDICINE  Office Visit Note  Patient Name: Laura [name]  [id number]  [id number]  Date of Service: 11/01/2018  Chief Complaint  Patient presents with  . Hypertension  . Gastroesophageal Reflux  . Medical Management of Chronic Issues    weight loss management     HPI   Pt here for follow up on HTN, GERD and medical weight loss management.  Patient reports her blood pressure has been okay.  However she is slightly elevated today 142/72.  She denies any other symptoms with her GERD recently.  She is requesting a refill on her weight loss medication.  She denies any weight since last visit however she has had multiple personal issues including death of a couple family members that have caused her to stress eat.  We discussed ways of coping she reports she has started taking her nerve pills again at her doctor's request.   Current Medication: Outpatient Encounter Medications as of 11/01/2018  Medication Sig  . ALPRAZolam (XANAX) 0.25 MG tablet Take 1 tablet (0.25 mg total) by mouth 2 (two) times daily.  Marland Kitchen dexlansoprazole (DEXILANT) 60 MG capsule Take 60 mg by mouth daily.   Marland Kitchen ELIQUIS 5 MG TABS tablet TAKE 1 TABLET (5 MG TOTAL) BY MOUTH 2 (TWO) TIMES DAILY.  Marland Kitchen EPINEPHrine 0.3 mg/0.3 mL IJ SOAJ injection Inject into the muscle.  . furosemide (LASIX) 40 MG tablet TAKE 1 TABLET BY MOUTH EVERY DAY  . guaiFENesin (MUCINEX) 600 MG 12 hr tablet Take 600 mg by mouth 2 (two) times daily as needed.  Marland Kitchen ibuprofen (ADVIL,MOTRIN) 200 MG tablet Take by mouth.  . Ipratropium-Albuterol (COMBIVENT RESPIMAT) 20-100 MCG/ACT AERS respimat Inhale 1 puff into the lungs 4 (four) times daily as needed. For shortness of breath/wheezing.  . levalbuterol (XOPENEX) 0.31 MG/3ML nebulizer solution Take 3 mLs by nebulization every 4 (four) hours as needed. For wheezing/ and or shortness of breath.  . levofloxacin (LEVAQUIN) 500 MG tablet Take  1 tablet (500 mg total) by mouth daily.  Marland Kitchen losartan (COZAAR) 25 MG tablet TAKE 1 TABLET BY MOUTH EVERY DAY  . montelukast (SINGULAIR) 10 MG tablet TAKE 1 TABLET BY MOUTH EVERY DAY  . Multiple Vitamin (MULTIVITAMIN) tablet Take 1 tablet by mouth daily.  . phentermine (ADIPEX-P) 37.5 MG tablet Take 1 tablet (37.5 mg total) by mouth daily.  . Probiotic Product (PROBIOTIC-10) CAPS Take 1 capsule by mouth 1 day or 1 dose.  . spironolactone (ALDACTONE) 25 MG tablet TAKE 1 TABLET BY MOUTH TWICE A DAY  . THEO-24 100 MG 24 hr capsule TAKE ONE CAPSULE BY MOUTH DAILY  . theophylline (THEO-24) 300 MG 24 hr capsule Take 300 mg by mouth daily.  . [DISCONTINUED] phentermine (ADIPEX-P) 37.5 MG tablet Take 1 tablet (37.5 mg total) by mouth daily.  Marland Kitchen diltiazem (CARDIZEM CD) 360 MG 24 hr capsule TAKE 1 CAPSULE (360 MG TOTAL) BY MOUTH DAILY.  . [DISCONTINUED] predniSONE (STERAPRED UNI-PAK 21 TAB) 10 MG (21) TBPK tablet 6 day taper - take by mouth as directed for 6 days (Patient not taking: Reported on 09/10/2018)   No facility-administered encounter medications on file as of 11/01/2018.     Surgical History: Past Surgical History:  Procedure Laterality Date  . INCISION AND DRAINAGE ABSCESS Right 06/29/2016   Procedure: INCISION AND DRAINAGE ABSCESS;  Surgeon: Florene Glen, MD;  Location: [facility] ORS;  Service: General;  Laterality: Right;  . INCISION AND DRAINAGE OF  WOUND Left 06/29/2016   Procedure: IRRIGATION AND DEBRIDEMENT WOUND;  Surgeon: Florene Glen, MD;  Location: [facility] ORS;  Service: General;  Laterality: Left;    Medical History: Past Medical History:  Diagnosis Date  . Asthma   . Chronic atrial fibrillation    a. diagnosed in 09/2016; b. failed flecainide and propafenone due to LE swelling and SOB, could not afford Multaq; c. CHADS2VASc => 3 (HTN, age x 1, female); d. on Eliquis  . GERD (gastroesophageal reflux disease)   . History of stress test    a. Lexiscan Myoview 10/2016: no evidence of  ischemia, EF 53%  . Hypertension   . Obesity   . Obstructive sleep apnea   . Pulmonary hypertension ([place])   . Systolic dysfunction    a. TTE 10/2016: EF 50%, mild LVH, moderately dilated LA, moderate MR/TR, mild pulmonary hypertension    Family History: Family History  Problem Relation Age of Onset  . Dementia Mother   . Osteoporosis Mother   . Vascular Disease Mother   . COPD Father     Social History   Socioeconomic History  . Marital status: Married    Spouse name: Not on file  . Number of children: Not on file  . Years of education: Not on file  . Highest education level: Not on file  Occupational History  . Not on file  Social Needs  . Financial resource strain: Not on file  . Food insecurity:    Worry: Not on file    Inability: Not on file  . Transportation needs:    Medical: Not on file    Non-medical: Not on file  Tobacco Use  . Smoking status: Never Smoker  . Smokeless tobacco: Never Used  Substance and Sexual Activity  . Alcohol use: No  . Drug use: No  . Sexual activity: Not on file  Lifestyle  . Physical activity:    Days per week: Not on file    Minutes per session: Not on file  . Stress: Not on file  Relationships  . Social connections:    Talks on phone: Not on file    Gets together: Not on file    Attends religious service: Not on file    Active member of club or organization: Not on file    Attends meetings of clubs or organizations: Not on file    Relationship status: Not on file  . Intimate partner violence:    Fear of current or ex partner: Not on file    Emotionally abused: Not on file    Physically abused: Not on file    Forced sexual activity: Not on file  Other Topics Concern  . Not on file  Social History Narrative  . Not on file      Review of Systems  Constitutional: Negative for chills, fatigue and unexpected weight change.  HENT: Negative for congestion, rhinorrhea, sneezing and sore throat.   Eyes: Negative for  photophobia, pain and redness.  Respiratory: Negative for cough, chest tightness and shortness of breath.   Cardiovascular: Negative for chest pain and palpitations.  Gastrointestinal: Negative for abdominal pain, constipation, diarrhea, nausea and vomiting.  Endocrine: Negative.   Genitourinary: Negative for dysuria and frequency.  Musculoskeletal: Negative for arthralgias, back pain, joint swelling and neck pain.  Skin: Negative for rash.  Allergic/Immunologic: Negative.   Neurological: Negative for tremors and numbness.  Hematological: Negative for adenopathy. Does not bruise/bleed easily.  Psychiatric/Behavioral: Negative for behavioral problems and sleep  disturbance. The patient is not nervous/anxious.     Vital Signs: BP (!) 142/72 (BP Location: Left Arm, Patient Position: Sitting, Cuff Size: Normal)   Pulse 60   Resp 16   Ht 5\' 4"  (1.626 m)   Wt 258 lb (117 kg)   SpO2 98%   BMI 44.29 kg/m    Physical Exam  Constitutional: She is oriented to person, place, and time. She appears well-developed and well-nourished. No distress.  HENT:  Head: Normocephalic and atraumatic.  Mouth/Throat: Oropharynx is clear and moist. No oropharyngeal exudate.  Eyes: Pupils are equal, round, and reactive to light. EOM are normal.  Neck: Normal range of motion. Neck supple. No JVD present. No tracheal deviation present. No thyromegaly present.  Cardiovascular: Normal rate, regular rhythm and normal heart sounds. Exam reveals no gallop and no friction rub.  No murmur heard. Pulmonary/Chest: Effort normal and breath sounds normal. No respiratory distress. She has no wheezes. She has no rales. She exhibits no tenderness.  Abdominal: Soft. There is no tenderness. There is no guarding.  Musculoskeletal: Normal range of motion.  Lymphadenopathy:    She has no cervical adenopathy.  Neurological: She is alert and oriented to person, place, and time. No cranial nerve deficit.  Skin: Skin is warm and  dry. She is not diaphoretic.  Psychiatric: She has a normal mood and affect. Her behavior is normal. Judgment and thought content normal.  Nursing note and vitals reviewed.   Assessment/Plan: 1. Severe persistent asthma without complication Breathing well at this time.  Has had some cough.    2. Essential (primary) hypertension Elevated today.  Pt has a lot of extraneous stress from family and work.    3. Body mass index (BMI) 40.0-44.9, adult (HCC) Obesity Counseling: Risk Assessment: An assessment of behavioral risk factors was made today and includes lack of exercise sedentary lifestyle, lack of portion control and poor dietary habits.  Risk Modification Advice: She was counseled on portion control guidelines. Restricting daily caloric intake to. . The detrimental long term effects of obesity on her health and ongoing poor compliance was also discussed with the patient. Refilled Controlled medications today. Reviewed risks and possible side effects associated with taking Stimulants. Combination of these drugs with other psychotropic medications could cause dizziness and drowsiness. Pt needs to Monitor symptoms and exercise caution in driving and operating heavy machinery to avoid damages to oneself, to others and to the surroundings. Patient verbalized understanding in this matter. Dependence and abuse for these drugs will be monitored closely. A Controlled substance policy and procedure is on file which allows [facility] to order a urine drug screen test at any visit. Patient understands and agrees with the plan..  - phentermine (ADIPEX-P) 37.5 MG tablet; Take 1 tablet (37.5 mg total) by mouth daily.  Dispense: 30 tablet; Refill: 0  There is a liability release in patients' chart. There has been a 10 minute discussion about the side effects including but not limited to elevated blood pressure, anxiety, lack of sleep and dry mouth. Pt understands and will like to start/continue  on appetite suppressant at this time. There will be one month RX given at the time of visit with proper follow up. [place] diet plan with restricted calories is given to the pt. Pt understands and agrees with  plan of treatment  4. Cough Persistent cough.  General Counseling: rosely fernandez understanding of the findings of todays visit and agrees with plan of treatment. I have discussed any further diagnostic  evaluation that may be needed or ordered today. We also reviewed her medications today. she has been encouraged to call the office with any questions or concerns that should arise related to todays visit.    No orders of the defined types were placed in this encounter.   Meds ordered this encounter  Medications  . phentermine (ADIPEX-P) 37.5 MG tablet    Sig: Take 1 tablet (37.5 mg total) by mouth daily.    Dispense:  30 tablet    Refill:  0    Time spent: 25 Minutes   This patient was seen by Orson Gear AGNP-C in Collaboration with Dr [name] as a part of collaborative care agreement     Kendell Bane AGNP-C Internal medicine

## 2018-11-01 NOTE — Patient Instructions (Signed)
Phentermine tablets or capsules What is this medicine? PHENTERMINE (FEN ter meen) decreases your appetite. It is used with a reduced calorie diet and exercise to help you lose weight. This medicine may be used for other purposes; ask your health care provider or pharmacist if you have questions. COMMON BRAND NAME(S): Adipex-P, Atti-Plex P, Atti-Plex P Spansule, Fastin, Lomaira, Pro-Fast, Tara-8 What should I tell my health care provider before I take this medicine? They need to know if you have any of these conditions: -agitation -glaucoma -heart disease -high blood pressure -history of substance abuse -lung disease called Primary Pulmonary Hypertension (PPH) -taken an MAOI like Carbex, Eldepryl, Marplan, Nardil, or Parnate in last 14 days -thyroid disease -an unusual or allergic reaction to phentermine, other medicines, foods, dyes, or preservatives -pregnant or trying to get pregnant -breast-feeding How should I use this medicine? Take this medicine by mouth with a glass of water. Follow the directions on the prescription label. The instructions for use may differ based on the product and dose you are taking. Avoid taking this medicine in the evening. It may interfere with sleep. Take your doses at regular intervals. Do not take your medicine more often than directed. Talk to your pediatrician regarding the use of this medicine in children. While this drug may be prescribed for children 17 years or older for selected conditions, precautions do apply. Overdosage: If you think you have taken too much of this medicine contact a poison control center or emergency room at once. NOTE: This medicine is only for you. Do not share this medicine with others. What if I miss a dose? If you miss a dose, take it as soon as you can. If it is almost time for your next dose, take only that dose. Do not take double or extra doses. What may interact with this medicine? Do not take this medicine with any of  the following medications: -duloxetine -MAOIs like Carbex, Eldepryl, Marplan, Nardil, and Parnate -medicines for colds or breathing difficulties like pseudoephedrine or phenylephrine -procarbazine -sibutramine -SSRIs like citalopram, escitalopram, fluoxetine, fluvoxamine, paroxetine, and sertraline -stimulants like dexmethylphenidate, methylphenidate or modafinil -venlafaxine This medicine may also interact with the following medications: -medicines for diabetes This list may not describe all possible interactions. Give your health care provider a list of all the medicines, herbs, non-prescription drugs, or dietary supplements you use. Also tell them if you smoke, drink alcohol, or use illegal drugs. Some items may interact with your medicine. What should I watch for while using this medicine? Notify your physician immediately if you become short of breath while doing your normal activities. Do not take this medicine within 6 hours of bedtime. It can keep you from getting to sleep. Avoid drinks that contain caffeine and try to stick to a regular bedtime every night. This medicine was intended to be used in addition to a healthy diet and exercise. The best results are achieved this way. This medicine is only indicated for short-term use. Eventually your weight loss may level out. At that point, the drug will only help you maintain your new weight. Do not increase or in any way change your dose without consulting your doctor. You may get drowsy or dizzy. Do not drive, use machinery, or do anything that needs mental alertness until you know how this medicine affects you. Do not stand or sit up quickly, especially if you are an older patient. This reduces the risk of dizzy or fainting spells. Alcohol may increase dizziness and drowsiness. Avoid alcoholic   drinks. What side effects may I notice from receiving this medicine? Side effects that you should report to your doctor or health care professional as  soon as possible: -chest pain, palpitations -depression or severe changes in mood -increased blood pressure -irritability -nervousness or restlessness -severe dizziness -shortness of breath -problems urinating -unusual swelling of the legs -vomiting Side effects that usually do not require medical attention (report to your doctor or health care professional if they continue or are bothersome): -blurred vision or other eye problems -changes in sexual ability or desire -constipation or diarrhea -difficulty sleeping -dry mouth or unpleasant taste -headache -nausea This list may not describe all possible side effects. Call your doctor for medical advice about side effects. You may report side effects to FDA at 1-800-FDA-1088. Where should I keep my medicine? Keep out of the reach of children. This medicine can be abused. Keep your medicine in a safe place to protect it from theft. Do not share this medicine with anyone. Selling or giving away this medicine is dangerous and against the law. This medicine may cause accidental overdose and death if taken by other adults, children, or pets. Mix any unused medicine with a substance like cat litter or coffee grounds. Then throw the medicine away in a sealed container like a sealed bag or a coffee can with a lid. Do not use the medicine after the expiration date. Store at room temperature between 20 and 25 degrees C (68 and 77 degrees F). Keep container tightly closed. NOTE: This sheet is a summary. It may not cover all possible information. If you have questions about this medicine, talk to your doctor, pharmacist, or health care provider.  2018 Elsevier/Gold Standard (2015-09-14 12:53:15)  

## 2018-11-17 ENCOUNTER — Ambulatory Visit (INDEPENDENT_AMBULATORY_CARE_PROVIDER_SITE_OTHER): Payer: BLUE CROSS/BLUE SHIELD

## 2018-11-17 DIAGNOSIS — G4733 Obstructive sleep apnea (adult) (pediatric): Secondary | ICD-10-CM

## 2018-11-17 NOTE — Progress Notes (Signed)
95 percentile pressure 14   95th percentile leak 11.6   apnea index 0.1 /hr  apnea-hypopnea index  0.3 /hr   total days used  >4 hr 90 days  total days used <4 hr 0 days  Total compliance 100 percent  Patient doing great on cpap but her oxygen concentrator has quit working I need an overnight oximetry to see about getting a new concentrator

## 2018-11-23 DIAGNOSIS — J9610 Chronic respiratory failure, unspecified whether with hypoxia or hypercapnia: Secondary | ICD-10-CM | POA: Diagnosis not present

## 2018-11-26 DIAGNOSIS — L03115 Cellulitis of right lower limb: Secondary | ICD-10-CM | POA: Diagnosis not present

## 2018-11-26 DIAGNOSIS — R5383 Other fatigue: Secondary | ICD-10-CM | POA: Diagnosis not present

## 2018-11-26 DIAGNOSIS — R5381 Other malaise: Secondary | ICD-10-CM | POA: Diagnosis not present

## 2018-11-29 ENCOUNTER — Ambulatory Visit: Payer: Self-pay | Admitting: Adult Health

## 2018-12-02 ENCOUNTER — Telehealth: Payer: Self-pay | Admitting: Internal Medicine

## 2018-12-02 ENCOUNTER — Other Ambulatory Visit: Payer: Self-pay

## 2018-12-02 NOTE — Telephone Encounter (Signed)
Per Dr Devona Konig reviewed patient's overnight oximetry and the results are showing her O2 with cpap is no longer needed, she does need her CPAP machine but ok to d.c. the oxygen, patient has been advised. Beth

## 2018-12-07 ENCOUNTER — Encounter: Payer: Self-pay | Admitting: Adult Health

## 2018-12-13 ENCOUNTER — Other Ambulatory Visit: Payer: Self-pay | Admitting: Cardiovascular Disease

## 2018-12-13 NOTE — Telephone Encounter (Signed)
Refill Request.  

## 2018-12-17 DIAGNOSIS — H6000 Abscess of external ear, unspecified ear: Secondary | ICD-10-CM | POA: Diagnosis not present

## 2018-12-17 DIAGNOSIS — L02821 Furuncle of head [any part, except face]: Secondary | ICD-10-CM | POA: Diagnosis not present

## 2018-12-23 ENCOUNTER — Encounter: Payer: Self-pay | Admitting: Nurse Practitioner

## 2018-12-23 ENCOUNTER — Ambulatory Visit (INDEPENDENT_AMBULATORY_CARE_PROVIDER_SITE_OTHER): Payer: BLUE CROSS/BLUE SHIELD | Admitting: Nurse Practitioner

## 2018-12-23 VITALS — BP 136/72 | HR 69 | Resp 16 | Ht 64.0 in | Wt 256.6 lb

## 2018-12-23 DIAGNOSIS — J4550 Severe persistent asthma, uncomplicated: Secondary | ICD-10-CM

## 2018-12-23 DIAGNOSIS — I10 Essential (primary) hypertension: Secondary | ICD-10-CM | POA: Diagnosis not present

## 2018-12-23 DIAGNOSIS — Z6841 Body Mass Index (BMI) 40.0 and over, adult: Secondary | ICD-10-CM | POA: Diagnosis not present

## 2018-12-23 DIAGNOSIS — L03811 Cellulitis of head [any part, except face]: Secondary | ICD-10-CM

## 2018-12-23 MED ORDER — PREDNISONE 10 MG PO TABS: 5.0000 mg | ORAL_TABLET | Freq: Every day | ORAL | 3 refills | Status: DC

## 2018-12-23 MED ORDER — PHENTERMINE HCL 37.5 MG PO TABS: 37.5000 mg | ORAL_TABLET | Freq: Every day | ORAL | 0 refills | Status: DC

## 2018-12-23 NOTE — Progress Notes (Signed)
[facility] [place], [place] [address]  Internal MEDICINE  Office Visit Note  Patient Name: Laura [name]  [id number]  [id number]  Date of Service: 12/23/2018  Chief Complaint  Patient presents with  . Medical Management of Chronic Issues    3 month follow up weight loss management, pt recently seen at Urgent care for cellulitis and knots in ear, was taking antiobiotics and hydrocodone for the pain.     The patient is here for routine follow up visit. States that she has struggled this past 6 weeks or so. Had significant cellulitis in right leg. Was put on two different antibiotics. Got better, then developed multiple lesions on the ears. Is currently on a new antibiotic and on prednisone 20mg  twice daily. She is improving and is still on antibiotics and the prednisone taper.  Has been on phentermine off and on. Has really been off since she has been sick. Had been unable to walk or exercise. Despite this, she has lost 2 pounds. She does need to have refill for phentermine so she can continue to lose weight. Has started new dietary restrictions. Eating a lot more vegetables and making better choices overall. She is back to work and exercising much more freuently now that she is feeling better.       Current Medication: Outpatient Encounter Medications as of 12/23/2018  Medication Sig  . ALPRAZolam (XANAX) 0.25 MG tablet Take 1 tablet (0.25 mg total) by mouth 2 (two) times daily.  Marland Kitchen azithromycin (ZITHROMAX) 250 MG tablet Take 1 tablet by mouth.  . dexlansoprazole (DEXILANT) 60 MG capsule Take 60 mg by mouth daily.   Marland Kitchen ELIQUIS 5 MG TABS tablet TAKE 1 TABLET (5 MG TOTAL) BY MOUTH 2 (TWO) TIMES DAILY.  Marland Kitchen EPINEPHrine 0.3 mg/0.3 mL IJ SOAJ injection Inject into the muscle.  . furosemide (LASIX) 40 MG tablet TAKE 1 TABLET BY MOUTH EVERY DAY  . guaiFENesin (MUCINEX) 600 MG 12 hr tablet Take 600 mg by mouth 2 (two) times daily as needed.  Marland Kitchen ibuprofen (ADVIL,MOTRIN)  200 MG tablet Take by mouth.  . Ipratropium-Albuterol (COMBIVENT RESPIMAT) 20-100 MCG/ACT AERS respimat Inhale 1 puff into the lungs 4 (four) times daily as needed. For shortness of breath/wheezing.  . levalbuterol (XOPENEX) 0.31 MG/3ML nebulizer solution Take 3 mLs by nebulization every 4 (four) hours as needed. For wheezing/ and or shortness of breath.  . levofloxacin (LEVAQUIN) 500 MG tablet Take 1 tablet (500 mg total) by mouth daily.  Marland Kitchen losartan (COZAAR) 25 MG tablet TAKE 1 TABLET BY MOUTH EVERY DAY  . montelukast (SINGULAIR) 10 MG tablet TAKE 1 TABLET BY MOUTH EVERY DAY  . Multiple Vitamin (MULTIVITAMIN) tablet Take 1 tablet by mouth daily.  . mupirocin ointment (BACTROBAN) 2 % Apply 1 application topically 3 (three) times daily.  . phentermine (ADIPEX-P) 37.5 MG tablet Take 1 tablet (37.5 mg total) by mouth daily.  . predniSONE (DELTASONE) 10 MG tablet Take 0.5 tablets (5 mg total) by mouth daily with breakfast.  . Probiotic Product (PROBIOTIC-10) CAPS Take 1 capsule by mouth 1 day or 1 dose.  . spironolactone (ALDACTONE) 25 MG tablet TAKE 1 TABLET BY MOUTH TWICE A DAY  . THEO-24 100 MG 24 hr capsule TAKE ONE CAPSULE BY MOUTH DAILY  . theophylline (THEO-24) 300 MG 24 hr capsule Take 300 mg by mouth daily.  . [DISCONTINUED] phentermine (ADIPEX-P) 37.5 MG tablet Take 1 tablet (37.5 mg total) by mouth daily.  . [DISCONTINUED] predniSONE (DELTASONE) 10  MG tablet Take 5 mg by mouth.  . diltiazem (CARDIZEM CD) 360 MG 24 hr capsule TAKE 1 CAPSULE (360 MG TOTAL) BY MOUTH DAILY.   No facility-administered encounter medications on file as of 12/23/2018.     Surgical History: Past Surgical History:  Procedure Laterality Date  . INCISION AND DRAINAGE ABSCESS Right 06/29/2016   Procedure: INCISION AND DRAINAGE ABSCESS;  Surgeon: Florene Glen, MD;  Location: [facility] ORS;  Service: General;  Laterality: Right;  . INCISION AND DRAINAGE OF WOUND Left 06/29/2016   Procedure: IRRIGATION AND DEBRIDEMENT  WOUND;  Surgeon: Florene Glen, MD;  Location: [facility] ORS;  Service: General;  Laterality: Left;    Medical History: Past Medical History:  Diagnosis Date  . Asthma   . Chronic atrial fibrillation    a. diagnosed in 09/2016; b. failed flecainide and propafenone due to LE swelling and SOB, could not afford Multaq; c. CHADS2VASc => 3 (HTN, age x 1, female); d. on Eliquis  . GERD (gastroesophageal reflux disease)   . History of stress test    a. Lexiscan Myoview 10/2016: no evidence of ischemia, EF 53%  . Hypertension   . Obesity   . Obstructive sleep apnea   . Pulmonary hypertension ([place])   . Systolic dysfunction    a. TTE 10/2016: EF 50%, mild LVH, moderately dilated LA, moderate MR/TR, mild pulmonary hypertension    Family History: Family History  Problem Relation Age of Onset  . Dementia Mother   . Osteoporosis Mother   . Vascular Disease Mother   . COPD Father     Social History   Socioeconomic History  . Marital status: Married    Spouse name: Not on file  . Number of children: Not on file  . Years of education: Not on file  . Highest education level: Not on file  Occupational History  . Not on file  Social Needs  . Financial resource strain: Not on file  . Food insecurity:    Worry: Not on file    Inability: Not on file  . Transportation needs:    Medical: Not on file    Non-medical: Not on file  Tobacco Use  . Smoking status: Never Smoker  . Smokeless tobacco: Never Used  Substance and Sexual Activity  . Alcohol use: No  . Drug use: No  . Sexual activity: Not on file  Lifestyle  . Physical activity:    Days per week: Not on file    Minutes per session: Not on file  . Stress: Not on file  Relationships  . Social connections:    Talks on phone: Not on file    Gets together: Not on file    Attends religious service: Not on file    Active member of club or organization: Not on file    Attends meetings of clubs or organizations: Not on file     Relationship status: Not on file  . Intimate partner violence:    Fear of current or ex partner: Not on file    Emotionally abused: Not on file    Physically abused: Not on file    Forced sexual activity: Not on file  Other Topics Concern  . Not on file  Social History Narrative  . Not on file      Review of Systems  Constitutional: Negative for activity change, fatigue and unexpected weight change.       Weight loss two pounds since her last visit .  HENT: Negative  for congestion, postnasal drip, rhinorrhea, sinus pressure and sinus pain.   Eyes: Negative.   Respiratory: Negative for cough, shortness of breath and wheezing.   Cardiovascular: Positive for palpitations. Negative for chest pain.  Gastrointestinal: Negative for diarrhea, nausea and vomiting.  Endocrine: Negative for cold intolerance, heat intolerance, polydipsia and polyuria.  Genitourinary: Negative.   Musculoskeletal: Negative for arthralgias, back pain and myalgias.  Skin: Negative for rash.       Recently treated for severe cellulitis in right lower leg and both ear canals. Lesions improving. Continues to be on antibiotics and prednisone 20mg  twice daily.   Allergic/Immunologic: Positive for environmental allergies.  Neurological: Negative for dizziness, weakness and headaches.  Hematological: Negative for adenopathy.  Psychiatric/Behavioral:       Intermittent anxiety which is well controlled.     Today's Vitals   12/23/18 1004  BP: 136/72  Pulse: 69  Resp: 16  SpO2: 96%  Weight: 256 lb 9.6 oz (116.4 kg)  Height: 5\' 4"  (1.626 m)  Body mass index is 44.05 kg/m.  Physical Exam Vitals signs and nursing note reviewed.  Constitutional:      Appearance: She is well-developed. She is obese.  HENT:     Head: Normocephalic and atraumatic.     Right Ear: Tympanic membrane normal.     Left Ear: Tympanic membrane normal.     Ears:     Comments: Healing, scabbed, skin lesions in external ear canal of left  ear.     Nose: Nose normal. No rhinorrhea.     Right Sinus: No maxillary sinus tenderness or frontal sinus tenderness.     Left Sinus: No maxillary sinus tenderness or frontal sinus tenderness.  Eyes:     Conjunctiva/sclera: Conjunctivae normal.     Pupils: Pupils are equal, round, and reactive to light.  Neck:     Musculoskeletal: Normal range of motion and neck supple.     Thyroid: No thyromegaly.     Vascular: No JVD.     Trachea: No tracheal deviation.  Cardiovascular:     Rate and Rhythm: Tachycardia present. Rhythm irregularly irregular. Frequent extrasystoles are present.    Heart sounds: Murmur present.  Pulmonary:     Effort: Pulmonary effort is normal. No accessory muscle usage or respiratory distress.     Breath sounds: Normal breath sounds. No wheezing.  Chest:     Breasts:        Right: No inverted nipple, mass, nipple discharge, skin change or tenderness.        Left: No inverted nipple, mass, nipple discharge, skin change or tenderness.  Abdominal:     General: Bowel sounds are normal.     Palpations: Abdomen is soft.     Tenderness: There is no abdominal tenderness.  Musculoskeletal: Normal range of motion.  Lymphadenopathy:     Cervical: No cervical adenopathy.  Skin:    General: Skin is warm and dry.     Comments: Healing cellulitis on left lower extremity   Neurological:     Mental Status: She is alert and oriented to person, place, and time.  Psychiatric:        Behavior: Behavior normal.        Thought Content: Thought content normal.        Judgment: Judgment normal.   Assessment/Plan: 1. Severe persistent asthma without complication Return to usual dosing of prednisone (5-10mg  daily) after completion of currently prescribed prednisone for cellulitis. Continue inhalers and respiratory medications as prescribed . - predniSONE (  DELTASONE) 10 MG tablet; Take 0.5 tablets (5 mg total) by mouth daily with breakfast.  Dispense: 30 tablet; Refill: 3  2. Body  mass index (BMI) 40.0-44.9, adult (HCC) Continue phentermine daily. Diet and lifestyle changes as reported. Continue to incorporate exercise into daily routine.  - phentermine (ADIPEX-P) 37.5 MG tablet; Take 1 tablet (37.5 mg total) by mouth daily.  Dispense: 30 tablet; Refill: 0  3. Essential (primary) hypertension Stable. Continue bp medication as prescribed   4. Cellulitis of head except face finishe antibiotics and prednisone dosing as prescribed per walk-in clinic. Condition improving.    General Counseling: lourdes kucharski understanding of the findings of todays visit and agrees with plan of treatment. I have discussed any further diagnostic evaluation that may be needed or ordered today. We also reviewed her medications today. she has been encouraged to call the office with any questions or concerns that should arise related to todays visit.   There is a liability release in patients' chart. There has been a 10 minute discussion about the side effects including but not limited to elevated blood pressure, anxiety, lack of sleep and dry mouth. Pt understands and will like to start/continue on appetite suppressant at this time. There will be one month RX given at the time of visit with proper follow up. Nova diet plan with restricted calories is given to the pt. Pt understands and agrees with  plan of treatment  This patient was seen by Leretha Pol FNP Collaboration with Dr [name] as a part of collaborative care agreement  Meds ordered this encounter  Medications  . phentermine (ADIPEX-P) 37.5 MG tablet    Sig: Take 1 tablet (37.5 mg total) by mouth daily.    Dispense:  30 tablet    Refill:  0    Order Specific Question:   Supervising Provider    Answer:   [name] [[id number]]  . predniSONE (DELTASONE) 10 MG tablet    Sig: Take 0.5 tablets (5 mg total) by mouth daily with breakfast.    Dispense:  30 tablet    Refill:  3    Order Specific Question:   Supervising  Provider    Answer:   [name] [[id number]]    Time spent: 74 Minutes      Dr [name] Internal medicine

## 2018-12-29 ENCOUNTER — Other Ambulatory Visit: Payer: Self-pay | Admitting: Internal Medicine

## 2018-12-29 DIAGNOSIS — J4550 Severe persistent asthma, uncomplicated: Secondary | ICD-10-CM

## 2019-01-02 DIAGNOSIS — M7652 Patellar tendinitis, left knee: Secondary | ICD-10-CM | POA: Diagnosis not present

## 2019-01-11 ENCOUNTER — Encounter: Payer: Self-pay | Admitting: Internal Medicine

## 2019-01-11 ENCOUNTER — Ambulatory Visit (INDEPENDENT_AMBULATORY_CARE_PROVIDER_SITE_OTHER): Payer: BLUE CROSS/BLUE SHIELD | Admitting: Internal Medicine

## 2019-01-11 VITALS — BP 120/80 | HR 66 | Resp 16 | Ht 64.0 in | Wt 264.0 lb

## 2019-01-11 DIAGNOSIS — Z9989 Dependence on other enabling machines and devices: Secondary | ICD-10-CM

## 2019-01-11 DIAGNOSIS — G4733 Obstructive sleep apnea (adult) (pediatric): Secondary | ICD-10-CM

## 2019-01-11 DIAGNOSIS — J4550 Severe persistent asthma, uncomplicated: Secondary | ICD-10-CM

## 2019-01-11 DIAGNOSIS — J301 Allergic rhinitis due to pollen: Secondary | ICD-10-CM

## 2019-01-11 NOTE — Patient Instructions (Addendum)
Asthma, Adult    Asthma is a long-term (chronic) condition in which the airways get tight and narrow. The airways are the breathing passages that lead from the nose and mouth down into the lungs. A person with asthma will have times when symptoms get worse. These are called asthma attacks. They can cause coughing, whistling sounds when you breathe (wheezing), shortness of breath, and chest pain. They can make it hard to breathe. There is no cure for asthma, but medicines and lifestyle changes can help control it.  There are many things that can bring on an asthma attack or make asthma symptoms worse (triggers). Common triggers include:  · Mold.  · Dust.  · Cigarette smoke.  · Cockroaches.  · Things that can cause allergy symptoms (allergens). These include animal skin flakes (dander) and pollen from trees or grass.  · Things that pollute the air. These may include household cleaners, wood smoke, smog, or chemical odors.  · Cold air, weather changes, and wind.  · Crying or laughing hard.  · Stress.  · Certain medicines or drugs.  · Certain foods such as dried fruit, potato chips, and grape juice.  · Infections, such as a cold or the flu.  · Certain medical conditions or diseases.  · Exercise or tiring activities.  Asthma may be treated with medicines and by staying away from the things that cause asthma attacks. Types of medicines may include:  · Controller medicines. These help prevent asthma symptoms. They are usually taken every day.  · Fast-acting reliever or rescue medicines. These quickly relieve asthma symptoms. They are used as needed and provide short-term relief.  · Allergy medicines if your attacks are brought on by allergens.  · Medicines to help control the body's defense (immune) system.  Follow these instructions at home:  Avoiding triggers in your home  · Change your heating and air conditioning filter often.  · Limit your use of fireplaces and wood stoves.  · Get rid of pests (such as roaches and  mice) and their droppings.  · Throw away plants if you see mold on them.  · Clean your floors. Dust regularly. Use cleaning products that do not smell.  · Have someone vacuum when you are not home. Use a vacuum cleaner with a HEPA filter if possible.  · Replace carpet with wood, tile, or vinyl flooring. Carpet can trap animal skin flakes and dust.  · Use allergy-proof pillows, mattress covers, and box spring covers.  · Wash bed sheets and blankets every week in hot water. Dry them in a dryer.  · Keep your bedroom free of any triggers.   · Avoid pets and keep windows closed when things that cause allergy symptoms are in the air.  · Use blankets that are made of polyester or cotton.  · Clean bathrooms and kitchens with bleach. If possible, have someone repaint the walls in these rooms with mold-resistant paint. Keep out of the rooms that are being cleaned and painted.  · Wash your hands often with soap and water. If soap and water are not available, use hand sanitizer.  · Do not allow anyone to smoke in your home.  General instructions  · Take over-the-counter and prescription medicines only as told by your doctor.  ? Talk with your doctor if you have questions about how or when to take your medicines.  ? Make note if you need to use your medicines more often than usual.  · Do not use any products that   when doing outdoor activities in the winter. The mask should cover your nose and mouth. Exercise indoors on cold days if you can.  Warm up before you exercise. Take time to cool down after exercise.  Use a peak flow meter as told by your doctor. A peak flow meter is a tool that measures how well the lungs are working.  Keep track of the peak flow meter's readings.  Write them down.  Follow your asthma action plan. This is a written plan for taking care of your asthma and treating your attacks.  Make sure you get all the shots (vaccines) that your doctor recommends. Ask your doctor about a flu shot and a pneumonia shot.  Keep all follow-up visits as told by your doctor. This is important. Contact a doctor if:  You have wheezing, shortness of breath, or a cough even while taking medicine to prevent attacks.  The mucus you cough up (sputum) is thicker than usual.  The mucus you cough up changes from clear or white to yellow, green, gray, or bloody.  You have problems from the medicine you are taking, such as: ? A rash. ? Itching. ? Swelling. ? Trouble breathing.  You need reliever medicines more than 2-3 times a week.  Your peak flow reading is still at 50-79% of your personal best after following the action plan for 1 hour.  You have a fever. Get help right away if:  You seem to be worse and are not responding to medicine during an asthma attack.  You are short of breath even at rest.  You get short of breath when doing very little activity.  You have trouble eating, drinking, or talking.  You have chest pain or tightness.  You have a fast heartbeat.  Your lips or fingernails start to turn blue.  You are light-headed or dizzy, or you faint.  Your peak flow is less than 50% of your personal best.  You feel too tired to breathe normally. Summary  Asthma is a long-term (chronic) condition in which the airways get tight and narrow. An asthma attack can make it hard to breathe.  Asthma cannot be cured, but medicines and lifestyle changes can help control it.  Make sure you understand how to avoid triggers and how and when to use your medicines. This information is not intended to replace advice given to you by your health care provider. Make sure you discuss any questions you have with your health care provider. Document  Released: 05/26/2008 Document Revised: 01/12/2017 Document Reviewed: 01/12/2017 Elsevier Interactive Patient Education  2019 [place] your age

## 2019-01-11 NOTE — Progress Notes (Signed)
[facility] [place], [place] [address]  Pulmonary Sleep Medicine   Office Visit Note  Patient Name: Laura [name] DOB: 08/12/1951 MRN [id number]  Date of Service: 01/11/2019  Complaints/HPI: OSA, Edema Cellulitis? GERD she is doing well with the CPAP.  Has not had any issues.  Feels less tired during the daytime denies having any headaches.  No snoring at nighttime.  The only other issue she has had is some increased swelling and cellulitis.  She is going to follow-up with her primary care for that.  Reflux is under good control also  ROS  General: (-) fever, (-) chills, (-) night sweats, (-) weakness Skin: (-) rashes, (-) itching,. Eyes: (-) visual changes, (-) redness, (-) itching. Nose and Sinuses: (-) nasal stuffiness or itchiness, (-) postnasal drip, (-) nosebleeds, (-) sinus trouble. Mouth and Throat: (-) sore throat, (-) hoarseness. Neck: (-) swollen glands, (-) enlarged thyroid, (-) neck pain. Respiratory: - cough, (-) bloody sputum, + shortness of breath, - wheezing. Cardiovascular: - ankle swelling, (-) chest pain. Lymphatic: (-) lymph node enlargement. Neurologic: (-) numbness, (-) tingling. Psychiatric: (-) anxiety, (-) depression   Current Medication: Outpatient Encounter Medications as of 01/11/2019  Medication Sig  . ALPRAZolam (XANAX) 0.25 MG tablet Take 1 tablet (0.25 mg total) by mouth 2 (two) times daily.  Marland Kitchen azithromycin (ZITHROMAX) 250 MG tablet Take 1 tablet by mouth.  . dexlansoprazole (DEXILANT) 60 MG capsule Take 60 mg by mouth daily.   Marland Kitchen ELIQUIS 5 MG TABS tablet TAKE 1 TABLET (5 MG TOTAL) BY MOUTH 2 (TWO) TIMES DAILY.  Marland Kitchen EPINEPHrine 0.3 mg/0.3 mL IJ SOAJ injection Inject into the muscle.  . furosemide (LASIX) 40 MG tablet TAKE 1 TABLET BY MOUTH EVERY DAY  . guaiFENesin (MUCINEX) 600 MG 12 hr tablet Take 600 mg by mouth 2 (two) times daily as needed.  Marland Kitchen ibuprofen (ADVIL,MOTRIN) 200 MG tablet Take by mouth.  .  Ipratropium-Albuterol (COMBIVENT RESPIMAT) 20-100 MCG/ACT AERS respimat Inhale 1 puff into the lungs 4 (four) times daily as needed. For shortness of breath/wheezing.  . levalbuterol (XOPENEX) 0.31 MG/3ML nebulizer solution Take 3 mLs by nebulization every 4 (four) hours as needed. For wheezing/ and or shortness of breath.  . levofloxacin (LEVAQUIN) 500 MG tablet Take 1 tablet (500 mg total) by mouth daily.  Marland Kitchen losartan (COZAAR) 25 MG tablet TAKE 1 TABLET BY MOUTH EVERY DAY  . montelukast (SINGULAIR) 10 MG tablet TAKE 1 TABLET BY MOUTH EVERY DAY  . Multiple Vitamin (MULTIVITAMIN) tablet Take 1 tablet by mouth daily.  . phentermine (ADIPEX-P) 37.5 MG tablet Take 1 tablet (37.5 mg total) by mouth daily.  . predniSONE (DELTASONE) 10 MG tablet Take 0.5 tablets (5 mg total) by mouth daily with breakfast.  . Probiotic Product (PROBIOTIC-10) CAPS Take 1 capsule by mouth 1 day or 1 dose.  . spironolactone (ALDACTONE) 25 MG tablet TAKE 1 TABLET BY MOUTH TWICE A DAY  . THEO-24 100 MG 24 hr capsule TAKE ONE CAPSULE BY MOUTH DAILY  . theophylline (THEO-24) 300 MG 24 hr capsule Take 300 mg by mouth daily.  Marland Kitchen diltiazem (CARDIZEM CD) 360 MG 24 hr capsule TAKE 1 CAPSULE (360 MG TOTAL) BY MOUTH DAILY.  . [DISCONTINUED] ELIQUIS 5 MG TABS tablet TAKE 1 TABLET (5 MG TOTAL) BY MOUTH 2 (TWO) TIMES DAILY.  . [DISCONTINUED] phentermine (ADIPEX-P) 37.5 MG tablet Take 1 tablet (37.5 mg total) by mouth daily.   No facility-administered encounter medications on file as of 01/11/2019.  Surgical History: Past Surgical History:  Procedure Laterality Date  . INCISION AND DRAINAGE ABSCESS Right 06/29/2016   Procedure: INCISION AND DRAINAGE ABSCESS;  Surgeon: Florene Glen, MD;  Location: [facility] ORS;  Service: General;  Laterality: Right;  . INCISION AND DRAINAGE OF WOUND Left 06/29/2016   Procedure: IRRIGATION AND DEBRIDEMENT WOUND;  Surgeon: Florene Glen, MD;  Location: [facility] ORS;  Service: General;  Laterality: Left;     Medical History: Past Medical History:  Diagnosis Date  . Asthma   . Chronic atrial fibrillation    a. diagnosed in 09/2016; b. failed flecainide and propafenone due to LE swelling and SOB, could not afford Multaq; c. CHADS2VASc => 3 (HTN, age x 1, female); d. on Eliquis  . GERD (gastroesophageal reflux disease)   . History of stress test    a. Lexiscan Myoview 10/2016: no evidence of ischemia, EF 53%  . Hypertension   . Obesity   . Obstructive sleep apnea   . Pulmonary hypertension ([place])   . Systolic dysfunction    a. TTE 10/2016: EF 50%, mild LVH, moderately dilated LA, moderate MR/TR, mild pulmonary hypertension    Family History: Family History  Problem Relation Age of Onset  . Dementia Mother   . Osteoporosis Mother   . Vascular Disease Mother   . COPD Father     Social History: Social History   Socioeconomic History  . Marital status: Married    Spouse name: Not on file  . Number of children: Not on file  . Years of education: Not on file  . Highest education level: Not on file  Occupational History  . Not on file  Social Needs  . Financial resource strain: Not on file  . Food insecurity:    Worry: Not on file    Inability: Not on file  . Transportation needs:    Medical: Not on file    Non-medical: Not on file  Tobacco Use  . Smoking status: Never Smoker  . Smokeless tobacco: Never Used  Substance and Sexual Activity  . Alcohol use: No  . Drug use: No  . Sexual activity: Not on file  Lifestyle  . Physical activity:    Days per week: Not on file    Minutes per session: Not on file  . Stress: Not on file  Relationships  . Social connections:    Talks on phone: Not on file    Gets together: Not on file    Attends religious service: Not on file    Active member of club or organization: Not on file    Attends meetings of clubs or organizations: Not on file    Relationship status: Not on file  . Intimate partner violence:    Fear of current or  ex partner: Not on file    Emotionally abused: Not on file    Physically abused: Not on file    Forced sexual activity: Not on file  Other Topics Concern  . Not on file  Social History Narrative  . Not on file    Vital Signs: Blood pressure 120/80, pulse 66, resp. rate 16, height 5\' 4"  (1.626 m), weight 264 lb (119.7 kg), SpO2 98 %.  Examination: General Appearance: The patient is well-developed, well-nourished, and in no distress. Skin: Gross inspection of skin unremarkable. Head: normocephalic, no gross deformities. Eyes: no gross deformities noted. ENT: ears appear grossly normal no exudates. Neck: Supple. No thyromegaly. No LAD. Respiratory: no rhonchi noted. Cardiovascular: Normal S1 and  S2 without murmur or rub. Extremities: No cyanosis. pulses are equal. Neurologic: Alert and oriented. No involuntary movements.  LABS: No results found for this or any previous visit (from the past 2160 hour(s)).  Radiology: US Venous Img Lower Unilateral Right  Result Date: 10/27/2016 CLINICAL DATA:  Pain and swelling x1 week EXAM: RIGHT LOWER EXTREMITY VENOUS DOPPLER ULTRASOUND TECHNIQUE: Gray-scale sonography with compression, as well as color and duplex ultrasound, were performed to evaluate the deep venous system from the level of the common femoral vein through the popliteal and proximal calf veins. COMPARISON:  None FINDINGS: Normal compressibility of the common femoral, superficial femoral, and popliteal veins, as well as the proximal calf veins. No filling defects to suggest DVT on grayscale or color Doppler imaging. Doppler waveforms show normal direction of venous flow, normal respiratory phasicity and response to augmentation. Noncompressible thrombosed superficial and subcutaneous varicose veins in the proximal posterior calf. Survey views of the contralateral common femoral vein are unremarkable. IMPRESSION: 1. No evidence of  lower extremity deep vein thrombosis, right. 2. Proximal  calf superficial thrombophlebitis. Electronically Signed   By: Lucrezia Europe M.D.   On: 10/27/2016 14:54    No results found.  No results found.    Assessment and Plan: Patient Active Problem List   Diagnosis Date Noted  . Cellulitis of head except face 12/23/2018  . Screening for breast cancer 08/15/2018  . Chronic bronchitis with acute exacerbation ([place]) 08/15/2018  . Vitamin D deficiency 08/15/2018  . Need for vaccination against Streptococcus pneumoniae using pneumococcal conjugate vaccine 13 08/15/2018  . Obstructive chronic bronchitis without exacerbation ([place]) 07/14/2018  . Body mass index (BMI) 40.0-44.9, adult ([place]) 03/17/2018  . Asthma 02/25/2018  . Generalized anxiety disorder 12/16/2017  . Essential (primary) hypertension 12/16/2017  . Chronic respiratory failure with hypoxia ([place]) 12/16/2017  . Long term (current) use of anticoagulants 12/16/2017  . Chronic atrial fibrillation 12/16/2017  . Other specified functional intestinal disorders 12/16/2017  . Gastro-esophageal reflux disease without esophagitis 12/16/2017  . Obstructive sleep apnea of adult 12/16/2017  . Allergic rhinitis due to animal (cat) (dog) hair and dander 12/16/2017  . Allergic rhinitis due to pollen 12/16/2017  . Severe persistent asthma without complication 09/47/0962  . Cough 12/16/2017  . Shortness of breath 12/16/2017  . Superficial thrombophlebitis of right leg 11/17/2016  . Pain in limb 11/17/2016  . Chronic venous insufficiency 11/17/2016  . Swelling of limb 11/17/2016  . Cellulitis of buttock   . Cellulitis of right axilla   . Sepsis ([place]) 06/28/2016  . Cellulitis and abscess 06/28/2016  . Hypokalemia 06/28/2016  . Acute renal insufficiency 06/28/2016  . Hyperglycemia 06/28/2016  . Leukocytosis 06/28/2016  . Hypoxia 06/28/2016  . Collagenous colitis 05/13/2016    1. OSA clinically improved we will continue with supportive care 2. Asthma improving continue with present  management 3. Morbid obesity needs to work on weight loss through diet and exercise 4. Allergic rhnitis symptoms are controlled we will continue to monitor  General Counseling: I have discussed the findings of the evaluation and examination with Laura [name].  I have also discussed any further diagnostic evaluation thatmay be needed or ordered today. Laura [name] verbalizes understanding of the findings of todays visit. We also reviewed her medications today and discussed drug interactions and side effects including but not limited excessive drowsiness and altered mental states. We also discussed that there is always a risk not just to her but also people around her. she has been encouraged to call the office  with any questions or concerns that should arise related to todays visit.    Time spent: 73min  I have personally obtained a history, examined the patient, evaluated laboratory and imaging results, formulated the assessment and plan and placed orders.    Allyne Gee, MD [facility] Pulmonary and Critical Care Sleep medicine

## 2019-01-12 ENCOUNTER — Other Ambulatory Visit: Payer: Self-pay | Admitting: Internal Medicine

## 2019-01-14 ENCOUNTER — Encounter: Payer: Self-pay | Admitting: Nurse Practitioner

## 2019-01-14 ENCOUNTER — Ambulatory Visit (INDEPENDENT_AMBULATORY_CARE_PROVIDER_SITE_OTHER): Payer: BLUE CROSS/BLUE SHIELD | Admitting: Nurse Practitioner

## 2019-01-14 VITALS — BP 130/80 | HR 72 | Resp 16 | Ht 64.0 in | Wt 262.0 lb

## 2019-01-14 DIAGNOSIS — M79604 Pain in right leg: Secondary | ICD-10-CM

## 2019-01-14 DIAGNOSIS — L03115 Cellulitis of right lower limb: Secondary | ICD-10-CM

## 2019-01-14 DIAGNOSIS — M7051 Other bursitis of knee, right knee: Secondary | ICD-10-CM

## 2019-01-14 DIAGNOSIS — B370 Candidal stomatitis: Secondary | ICD-10-CM

## 2019-01-14 DIAGNOSIS — I872 Venous insufficiency (chronic) (peripheral): Secondary | ICD-10-CM

## 2019-01-14 MED ORDER — NYSTATIN 100000 UNIT/ML MT SUSP: 5.0000 mL | Freq: Four times a day (QID) | OROMUCOSAL | 1 refills | Status: DC

## 2019-01-14 MED ORDER — PREDNISONE 10 MG (48) PO TBPK: ORAL_TABLET | ORAL | 0 refills | Status: DC

## 2019-01-14 MED ORDER — CIPROFLOXACIN HCL 500 MG PO TABS: 500.0000 mg | ORAL_TABLET | Freq: Two times a day (BID) | ORAL | 0 refills | Status: DC

## 2019-01-14 MED ORDER — ACETAMINOPHEN-CODEINE #3 300-30 MG PO TABS: 1.0000 | ORAL_TABLET | Freq: Three times a day (TID) | ORAL | 0 refills | Status: DC | PRN

## 2019-01-14 NOTE — Progress Notes (Signed)
[facility] [place], [place] [address]  Internal MEDICINE  Office Visit Note  Patient Name: Laura [name]  [id number]  [id number]  Date of Service: 01/23/2019   Pt is here for a sick visit.  Chief Complaint  Patient presents with  . Bursitis  . Cellulitis    both legs      The patient is here for sick visit. Lesions of bilateral lower legs have become more severe again. They are red, swollen, and very tender. There are areas of open skin which drain a clear fluid intermittently. The patient also states that she is suffering from bursitis in her right knee. Pain is so severe that walking is becoming very difficult.        Current Medication:  Outpatient Encounter Medications as of 01/14/2019  Medication Sig  . ALPRAZolam (XANAX) 0.25 MG tablet Take 1 tablet (0.25 mg total) by mouth 2 (two) times daily.  Marland Kitchen azithromycin (ZITHROMAX) 250 MG tablet Take 1 tablet by mouth.  . dexlansoprazole (DEXILANT) 60 MG capsule Take 60 mg by mouth daily.   Marland Kitchen ELIQUIS 5 MG TABS tablet TAKE 1 TABLET (5 MG TOTAL) BY MOUTH 2 (TWO) TIMES DAILY.  Marland Kitchen EPINEPHrine 0.3 mg/0.3 mL IJ SOAJ injection Inject into the muscle.  . furosemide (LASIX) 40 MG tablet TAKE 1 TABLET BY MOUTH EVERY DAY  . guaiFENesin (MUCINEX) 600 MG 12 hr tablet Take 600 mg by mouth 2 (two) times daily as needed.  Marland Kitchen ibuprofen (ADVIL,MOTRIN) 200 MG tablet Take by mouth.  . Ipratropium-Albuterol (COMBIVENT RESPIMAT) 20-100 MCG/ACT AERS respimat Inhale 1 puff into the lungs 4 (four) times daily as needed. For shortness of breath/wheezing.  . levalbuterol (XOPENEX) 0.31 MG/3ML nebulizer solution Take 3 mLs by nebulization every 4 (four) hours as needed. For wheezing/ and or shortness of breath.  . losartan (COZAAR) 25 MG tablet TAKE 1 TABLET BY MOUTH EVERY DAY  . montelukast (SINGULAIR) 10 MG tablet TAKE 1 TABLET BY MOUTH EVERY DAY  . Multiple Vitamin (MULTIVITAMIN) tablet Take 1 tablet by mouth daily.  .  phentermine (ADIPEX-P) 37.5 MG tablet Take 1 tablet (37.5 mg total) by mouth daily.  . Probiotic Product (PROBIOTIC-10) CAPS Take 1 capsule by mouth 1 day or 1 dose.  . spironolactone (ALDACTONE) 25 MG tablet TAKE 1 TABLET BY MOUTH TWICE A DAY  . THEO-24 100 MG 24 hr capsule TAKE ONE CAPSULE BY MOUTH DAILY  . theophylline (THEO-24) 300 MG 24 hr capsule Take 300 mg by mouth daily.  . [DISCONTINUED] predniSONE (DELTASONE) 10 MG tablet Take 0.5 tablets (5 mg total) by mouth daily with breakfast.  . acetaminophen-codeine (TYLENOL #3) 300-30 MG tablet Take 1 tablet by mouth every 8 (eight) hours as needed for moderate pain.  . ciprofloxacin (CIPRO) 500 MG tablet Take 1 tablet (500 mg total) by mouth 2 (two) times daily.  Marland Kitchen diltiazem (CARDIZEM CD) 360 MG 24 hr capsule TAKE 1 CAPSULE (360 MG TOTAL) BY MOUTH DAILY.  Marland Kitchen nystatin (MYCOSTATIN) 100000 UNIT/ML suspension Take 5 mLs (500,000 Units total) by mouth 4 (four) times daily.  . predniSONE (STERAPRED UNI-PAK 48 TAB) 10 MG (48) TBPK tablet 12 day taper - take by mouth as directed for 12 days  . [DISCONTINUED] levofloxacin (LEVAQUIN) 500 MG tablet Take 1 tablet (500 mg total) by mouth daily. (Patient not taking: Reported on 01/14/2019)   No facility-administered encounter medications on file as of 01/14/2019.       Medical History: Past Medical History:  Diagnosis Date  . Asthma   . Chronic atrial fibrillation    a. diagnosed in 09/2016; b. failed flecainide and propafenone due to LE swelling and SOB, could not afford Multaq; c. CHADS2VASc => 3 (HTN, age x 1, female); d. on Eliquis  . GERD (gastroesophageal reflux disease)   . History of stress test    a. Lexiscan Myoview 10/2016: no evidence of ischemia, EF 53%  . Hypertension   . Obesity   . Obstructive sleep apnea   . Pulmonary hypertension ([place])   . Systolic dysfunction    a. TTE 10/2016: EF 50%, mild LVH, moderately dilated LA, moderate MR/TR, mild pulmonary hypertension     Today's  Vitals   01/14/19 1144  BP: 130/80  Pulse: 72  Resp: 16  SpO2: 97%  Weight: 262 lb (118.8 kg)  Height: 5\' 4"  (1.626 m)   Body mass index is 44.97 kg/m.  Review of Systems  Constitutional: Positive for activity change, chills and fatigue. Negative for fever and unexpected weight change.  HENT: Negative for congestion, postnasal drip, rhinorrhea, sneezing and sore throat.   Respiratory: Negative for cough, chest tightness, shortness of breath and wheezing.   Cardiovascular: Negative for chest pain and palpitations.  Gastrointestinal: Negative for abdominal pain, constipation, diarrhea, nausea and vomiting.  Endocrine: Negative for cold intolerance, heat intolerance, polydipsia and polyuria.  Musculoskeletal: Positive for arthralgias. Negative for back pain, joint swelling and neck pain.  Skin: Positive for rash.       Swelling and redness of bilateral lower legs. There are a few open areas with some clear drainage present.   Allergic/Immunologic: Negative for environmental allergies.  Neurological: Positive for headaches. Negative for tremors and numbness.  Hematological: Negative for adenopathy. Does not bruise/bleed easily.  Psychiatric/Behavioral: Negative for behavioral problems (Depression), sleep disturbance and suicidal ideas. The patient is nervous/anxious.     Physical Exam Constitutional:      General: She is in acute distress.     Appearance: She is well-developed. She is ill-appearing. She is not diaphoretic.  HENT:     Head: Normocephalic and atraumatic.     Mouth/Throat:     Pharynx: No oropharyngeal exudate.  Eyes:     Conjunctiva/sclera: Conjunctivae normal.     Pupils: Pupils are equal, round, and reactive to light.  Neck:     Musculoskeletal: Normal range of motion and neck supple.     Thyroid: No thyromegaly.     Vascular: No JVD.     Trachea: No tracheal deviation.  Cardiovascular:     Rate and Rhythm: Normal rate and regular rhythm.     Heart sounds:  Normal heart sounds. No murmur. No friction rub. No gallop.   Pulmonary:     Effort: Pulmonary effort is normal. No respiratory distress.     Breath sounds: Normal breath sounds. No wheezing or rales.  Chest:     Chest wall: No tenderness.  Abdominal:     General: Bowel sounds are normal.     Palpations: Abdomen is soft.  Musculoskeletal: Normal range of motion.     Comments: Severe pain in right knee. Pain so severe, it is difficult for her to walk. There is some swelling present and flexion and extension of the right knee causes increased pain.   Lymphadenopathy:     Cervical: No cervical adenopathy.  Skin:    General: Skin is warm and dry.     Capillary Refill: Capillary refill takes 2 to 3 seconds.  Comments: Swelling, redness, and pain of bilateral lower extremities. Warmth felt with palpation of both lower legs. There are several round, open lesions in the skin with clear drainage present. She also has moderate varicose veins in both legs.   Neurological:     Mental Status: She is alert and oriented to person, place, and time.     Cranial Nerves: No cranial nerve deficit.  Psychiatric:        Behavior: Behavior normal.        Thought Content: Thought content normal.        Judgment: Judgment normal.    1. Suprapatellar bursitis of right knee Add prednisone taper. Take as directed for 12 days. Rest, ice and elevate the right leg when possible.  - predniSONE (STERAPRED UNI-PAK 48 TAB) 10 MG (48) TBPK tablet; 12 day taper - take by mouth as directed for 12 days  Dispense: 48 tablet; Refill: 0  2. Pain in right leg Sort term prescription for tylenol #3 will be given. Prescription written as three times daily if needed for pain. Reviewed risk factors and side effects associated with taking narcotic pain medication.  - acetaminophen-codeine (TYLENOL #3) 300-30 MG tablet; Take 1 tablet by mouth every 8 (eight) hours as needed for moderate pain.  Dispense: 30 tablet; Refill: 0  3.  Cellulitis of right leg Start cipro 500mg  twice daily for 10 days. Will get ultrasound of lower extremity to evaluate for venous reflux.  - ciprofloxacin (CIPRO) 500 MG tablet; Take 1 tablet (500 mg total) by mouth 2 (two) times daily.  Dispense: 20 tablet; Refill: 0 - [place] LE VEN REFLUX RIGHT; Future  4. Chronic venous stasis dermatitis of right lower extremity Will get ultrasound of lower extremity to evaluate for venous reflux.  - [place] LE VEN REFLUX RIGHT; Future  5. Oropharyngeal candidiasis Nystatin suspension should be used up to four times daily.  - nystatin (MYCOSTATIN) 100000 UNIT/ML suspension; Take 5 mLs (500,000 Units total) by mouth 4 (four) times daily.  Dispense: 200 mL; Refill: 1Assessment/Plan:   General Counseling: Laura [name] verbalizes understanding of the findings of todays visit and agrees with plan of treatment. I have discussed any further diagnostic evaluation that may be needed or ordered today. We also reviewed her medications today. she has been encouraged to call the office with any questions or concerns that should arise related to todays visit.    Counseling:  This patient was seen by Leretha Pol FNP Collaboration with Dr [name] as a part of collaborative care agreement  Orders Placed This Encounter  Procedures  . [place] LE VEN REFLUX RIGHT    Meds ordered this encounter  Medications  . predniSONE (STERAPRED UNI-PAK 48 TAB) 10 MG (48) TBPK tablet    Sig: 12 day taper - take by mouth as directed for 12 days    Dispense:  48 tablet    Refill:  0    Order Specific Question:   Supervising Provider    Answer:   [name] [place]  . ciprofloxacin (CIPRO) 500 MG tablet    Sig: Take 1 tablet (500 mg total) by mouth 2 (two) times daily.    Dispense:  20 tablet    Refill:  0    Order Specific Question:   Supervising Provider    Answer:   [name] [[id number]]  . nystatin (MYCOSTATIN) 100000 UNIT/ML suspension    Sig: Take 5 mLs (500,000 Units total) by  mouth 4 (four) times daily.  Dispense:  200 mL    Refill:  1    Order Specific Question:   Supervising Provider    Answer:   [name] [[id number]]  . acetaminophen-codeine (TYLENOL #3) 300-30 MG tablet    Sig: Take 1 tablet by mouth every 8 (eight) hours as needed for moderate pain.    Dispense:  30 tablet    Refill:  0    Order Specific Question:   Supervising Provider    Answer:   [name] [[id number]]    Time spent: 25 Minutes

## 2019-01-18 ENCOUNTER — Ambulatory Visit: Payer: Self-pay | Admitting: Internal Medicine

## 2019-01-23 DIAGNOSIS — M79604 Pain in right leg: Secondary | ICD-10-CM | POA: Insufficient documentation

## 2019-01-23 DIAGNOSIS — B370 Candidal stomatitis: Secondary | ICD-10-CM | POA: Insufficient documentation

## 2019-01-23 DIAGNOSIS — M7051 Other bursitis of knee, right knee: Secondary | ICD-10-CM | POA: Insufficient documentation

## 2019-01-23 DIAGNOSIS — L03115 Cellulitis of right lower limb: Secondary | ICD-10-CM | POA: Insufficient documentation

## 2019-01-23 DIAGNOSIS — I872 Venous insufficiency (chronic) (peripheral): Secondary | ICD-10-CM | POA: Insufficient documentation

## 2019-01-24 ENCOUNTER — Telehealth: Payer: Self-pay

## 2019-01-24 ENCOUNTER — Other Ambulatory Visit: Payer: Self-pay | Admitting: Nurse Practitioner

## 2019-01-24 DIAGNOSIS — L03115 Cellulitis of right lower limb: Secondary | ICD-10-CM

## 2019-01-24 MED ORDER — SULFAMETHOXAZOLE-TRIMETHOPRIM 800-160 MG PO TABS: 1.0000 | ORAL_TABLET | Freq: Two times a day (BID) | ORAL | 0 refills | Status: DC

## 2019-01-24 NOTE — Telephone Encounter (Signed)
PT SAID THAT THE PHARMACY HAS NOT FILLED ABX DUE TO IT BEING COUNTERACTIVE WITH A MED SHE IS TAKING.  CALLED PT PHARMACY TO CLARIFY WHICH DRUG IS COUNTERACTING AND THEY NOTIFIED ME THAT IT IS THE THEOPHYLINE THAT WILL SEVERELY COUNTERACT.  NOTIFIED HEATHER OF THIS AND SHE CALLED IN BACTRIM FOR PT TO TAKE INSTEAD OF CIPRO.

## 2019-01-24 NOTE — Progress Notes (Signed)
Will d/c cipro due to interaction between cipro and theo. Change to bactrim DS bid for next 14 days. New prescription sent to [facility] in [place].

## 2019-01-24 NOTE — Telephone Encounter (Signed)
There is lab slip on my desk for her. Can be picked up or we can mail to her .

## 2019-01-26 NOTE — Telephone Encounter (Signed)
PT WILL PICK UP AT HER APPT ON 01/28/2019

## 2019-01-28 ENCOUNTER — Ambulatory Visit (INDEPENDENT_AMBULATORY_CARE_PROVIDER_SITE_OTHER): Payer: BLUE CROSS/BLUE SHIELD

## 2019-01-28 ENCOUNTER — Other Ambulatory Visit: Payer: Self-pay | Admitting: Nurse Practitioner

## 2019-01-28 DIAGNOSIS — M064 Inflammatory polyarthropathy: Secondary | ICD-10-CM | POA: Diagnosis not present

## 2019-01-28 DIAGNOSIS — L03115 Cellulitis of right lower limb: Secondary | ICD-10-CM | POA: Diagnosis not present

## 2019-01-28 DIAGNOSIS — I872 Venous insufficiency (chronic) (peripheral): Secondary | ICD-10-CM

## 2019-01-29 LAB — ANA W/REFLEX IF POSITIVE
Anti JO-1: <0.2 AI (ref 0.0–0.9)
Anti Nuclear Antibody(ANA): POSITIVE — AB
Centromere Ab Screen: <0.2 AI (ref 0.0–0.9)
Chromatin Ab SerPl-aCnc: <0.2 AI (ref 0.0–0.9)
ENA RNP Ab: 1.8 AI — ABNORMAL HIGH (ref 0.0–0.9)
ENA SM Ab Ser-aCnc: 0.2 AI (ref 0.0–0.9)
ENA SSA (RO) Ab: <0.2 AI (ref 0.0–0.9)
ENA SSB (LA) Ab: <0.2 AI (ref 0.0–0.9)
Scleroderma SCL-70: <0.2 AI (ref 0.0–0.9)
dsDNA Ab: 1 IU/mL (ref 0–9)

## 2019-01-29 LAB — BASIC METABOLIC PANEL
BUN/Creatinine Ratio: 26 (ref 12–28)
BUN: 30 mg/dL — AB (ref 8–27)
CHLORIDE: 101 mmol/L (ref 96–106)
CO2: 21 mmol/L (ref 20–29)
Calcium: 9.5 mg/dL (ref 8.7–10.3)
Creatinine, Ser: 1.15 mg/dL — ABNORMAL HIGH (ref 0.57–1.00)
GFR calc Af Amer: 56 mL/min/{1.73_m2} — ABNORMAL LOW (ref >59)
GFR calc non Af Amer: 49 mL/min/{1.73_m2} — ABNORMAL LOW (ref >59)
Glucose: 75 mg/dL (ref 65–99)
Potassium: 5 mmol/L (ref 3.5–5.2)
Sodium: 136 mmol/L (ref 134–144)

## 2019-01-29 LAB — RHEUMATOID FACTOR: Rheumatoid fact SerPl-aCnc: 10.5 IU/mL (ref 0.0–13.9)

## 2019-01-29 LAB — CBC
Hematocrit: 38.6 % (ref 34.0–46.6)
Hemoglobin: 13.2 g/dL (ref 11.1–15.9)
MCH: 31.4 pg (ref 26.6–33.0)
MCHC: 34.2 g/dL (ref 31.5–35.7)
MCV: 92 fL (ref 79–97)
Platelets: 350 10*3/uL (ref 150–450)
RBC: 4.2 x10E6/uL (ref 3.77–5.28)
RDW: 14 % (ref 11.7–15.4)
WBC: 12.7 10*3/uL — AB (ref 3.4–10.8)

## 2019-01-29 LAB — URIC ACID: Uric Acid: 5 mg/dL (ref 2.5–7.1)

## 2019-01-29 LAB — SEDIMENTATION RATE: Sed Rate: 9 mm/hr (ref 0–40)

## 2019-02-03 ENCOUNTER — Encounter: Payer: Self-pay | Admitting: Nurse Practitioner

## 2019-02-03 ENCOUNTER — Ambulatory Visit (INDEPENDENT_AMBULATORY_CARE_PROVIDER_SITE_OTHER): Payer: BLUE CROSS/BLUE SHIELD | Admitting: Nurse Practitioner

## 2019-02-03 VITALS — BP 140/82 | HR 60 | Temp 97.7°F | Resp 16 | Ht 64.0 in | Wt 268.0 lb

## 2019-02-03 DIAGNOSIS — J209 Acute bronchitis, unspecified: Secondary | ICD-10-CM

## 2019-02-03 DIAGNOSIS — B370 Candidal stomatitis: Secondary | ICD-10-CM

## 2019-02-03 DIAGNOSIS — J42 Unspecified chronic bronchitis: Secondary | ICD-10-CM

## 2019-02-03 DIAGNOSIS — R6889 Other general symptoms and signs: Secondary | ICD-10-CM | POA: Insufficient documentation

## 2019-02-03 DIAGNOSIS — Z6841 Body Mass Index (BMI) 40.0 and over, adult: Secondary | ICD-10-CM

## 2019-02-03 DIAGNOSIS — M7051 Other bursitis of knee, right knee: Secondary | ICD-10-CM | POA: Diagnosis not present

## 2019-02-03 DIAGNOSIS — R110 Nausea: Secondary | ICD-10-CM

## 2019-02-03 DIAGNOSIS — L03115 Cellulitis of right lower limb: Secondary | ICD-10-CM

## 2019-02-03 LAB — POCT INFLUENZA A/B
Influenza A, POC: NEGATIVE
Influenza B, POC: NEGATIVE

## 2019-02-03 MED ORDER — PHENTERMINE HCL 37.5 MG PO TABS: 37.5000 mg | ORAL_TABLET | Freq: Every day | ORAL | 0 refills | Status: DC

## 2019-02-03 MED ORDER — PREDNISONE 10 MG (48) PO TBPK: ORAL_TABLET | ORAL | 0 refills | Status: DC

## 2019-02-03 MED ORDER — SULFAMETHOXAZOLE-TRIMETHOPRIM 800-160 MG PO TABS: 1.0000 | ORAL_TABLET | Freq: Two times a day (BID) | ORAL | 0 refills | Status: DC

## 2019-02-03 MED ORDER — ONDANSETRON 8 MG PO TBDP: 8.0000 mg | ORAL_TABLET | Freq: Three times a day (TID) | ORAL | 0 refills | Status: DC | PRN

## 2019-02-03 MED ORDER — IPRATROPIUM-ALBUTEROL 0.5-2.5 (3) MG/3ML IN SOLN: 3.0000 mL | RESPIRATORY_TRACT | 3 refills | Status: DC | PRN

## 2019-02-03 MED ORDER — FLUCONAZOLE 150 MG PO TABS: ORAL_TABLET | ORAL | 0 refills | Status: DC

## 2019-02-03 MED ORDER — NYSTATIN 100000 UNIT/ML MT SUSP: 5.0000 mL | Freq: Four times a day (QID) | OROMUCOSAL | 1 refills | Status: DC

## 2019-02-03 NOTE — Progress Notes (Cosign Needed)
[facility] [place], [place] [address]  Internal MEDICINE  Office Visit Note  Patient Name: Laura [name]  [id number]  [id number]  Date of Service: 02/03/2019  Chief Complaint  Patient presents with  . Nausea  . Dizziness  . Cough  . Generalized Body Aches  . Chills  . Medical Management of Chronic Issues    Weight management    The patient is here for routine follow up of weight management. She is having trouble with cough, wheezing, nausea, and shortness of breath. Symptoms started Monday and have gradually become worse. Had to be sent  Home from work. Did try to work Tuesday, but was sent home again. Has been using nebulizer treatments, but still having trouble with wheezing and cough. Getting very dizzy. Currently on Bactrim DS twice daily due to severe cellulitis in right lower leg. She is about half way through the antibiotics. Leg is still warm and red, however, swelling and tenderness have become less severe. There is less tenderness in her legs       Current Medication: Outpatient Encounter Medications as of 02/03/2019  Medication Sig  . acetaminophen-codeine (TYLENOL #3) 300-30 MG tablet Take 1 tablet by mouth every 8 (eight) hours as needed for moderate pain.  Marland Kitchen ALPRAZolam (XANAX) 0.25 MG tablet Take 1 tablet (0.25 mg total) by mouth 2 (two) times daily.  Marland Kitchen dexlansoprazole (DEXILANT) 60 MG capsule Take 60 mg by mouth daily.   Marland Kitchen diltiazem (CARDIZEM CD) 360 MG 24 hr capsule TAKE 1 CAPSULE (360 MG TOTAL) BY MOUTH DAILY.  Marland Kitchen ELIQUIS 5 MG TABS tablet TAKE 1 TABLET (5 MG TOTAL) BY MOUTH 2 (TWO) TIMES DAILY.  Marland Kitchen EPINEPHrine 0.3 mg/0.3 mL IJ SOAJ injection Inject into the muscle.  . fluconazole (DIFLUCAN) 150 MG tablet Take 1 tablet po once. May repeat dose in 3 days as needed for persistent symptoms.  . furosemide (LASIX) 40 MG tablet TAKE 1 TABLET BY MOUTH EVERY DAY  . guaiFENesin (MUCINEX) 600 MG 12 hr tablet Take 600 mg by mouth 2 (two) times  daily as needed.  Marland Kitchen ibuprofen (ADVIL,MOTRIN) 200 MG tablet Take by mouth.  . Ipratropium-Albuterol (COMBIVENT RESPIMAT) 20-100 MCG/ACT AERS respimat Inhale 1 puff into the lungs 4 (four) times daily as needed. For shortness of breath/wheezing.  Marland Kitchen ipratropium-albuterol (DUONEB) 0.5-2.5 (3) MG/3ML SOLN Take 3 mLs by nebulization every 4 (four) hours as needed.  . levalbuterol (XOPENEX) 0.31 MG/3ML nebulizer solution Take 3 mLs by nebulization every 4 (four) hours as needed. For wheezing/ and or shortness of breath.  . losartan (COZAAR) 25 MG tablet TAKE 1 TABLET BY MOUTH EVERY DAY  . montelukast (SINGULAIR) 10 MG tablet TAKE 1 TABLET BY MOUTH EVERY DAY  . Multiple Vitamin (MULTIVITAMIN) tablet Take 1 tablet by mouth daily.  Marland Kitchen nystatin (MYCOSTATIN) 100000 UNIT/ML suspension Take 5 mLs (500,000 Units total) by mouth 4 (four) times daily.  . ondansetron (ZOFRAN-ODT) 8 MG disintegrating tablet Take 1 tablet (8 mg total) by mouth every 8 (eight) hours as needed for nausea or vomiting.  . phentermine (ADIPEX-P) 37.5 MG tablet Take 1 tablet (37.5 mg total) by mouth daily.  . predniSONE (STERAPRED UNI-PAK 48 TAB) 10 MG (48) TBPK tablet 12 day taper - take by mouth as directed for 12 days  . Probiotic Product (PROBIOTIC-10) CAPS Take 1 capsule by mouth 1 day or 1 dose.  . spironolactone (ALDACTONE) 25 MG tablet TAKE 1 TABLET BY MOUTH TWICE A DAY  . sulfamethoxazole-trimethoprim (BACTRIM  DS,SEPTRA DS) 800-160 MG tablet Take 1 tablet by mouth 2 (two) times daily.  Marland Kitchen THEO-24 100 MG 24 hr capsule TAKE ONE CAPSULE BY MOUTH DAILY  . theophylline (THEO-24) 300 MG 24 hr capsule Take 300 mg by mouth daily.  . [DISCONTINUED] nystatin (MYCOSTATIN) 100000 UNIT/ML suspension Take 5 mLs (500,000 Units total) by mouth 4 (four) times daily.  . [DISCONTINUED] phentermine (ADIPEX-P) 37.5 MG tablet Take 1 tablet (37.5 mg total) by mouth daily.  . [DISCONTINUED] predniSONE (STERAPRED UNI-PAK 48 TAB) 10 MG (48) TBPK tablet 12  day taper - take by mouth as directed for 12 days  . [DISCONTINUED] sulfamethoxazole-trimethoprim (BACTRIM DS,SEPTRA DS) 800-160 MG tablet Take 1 tablet by mouth 2 (two) times daily.   No facility-administered encounter medications on file as of 02/03/2019.     Surgical History: Past Surgical History:  Procedure Laterality Date  . INCISION AND DRAINAGE ABSCESS Right 06/29/2016   Procedure: INCISION AND DRAINAGE ABSCESS;  Surgeon: Florene Glen, MD;  Location: [facility] ORS;  Service: General;  Laterality: Right;  . INCISION AND DRAINAGE OF WOUND Left 06/29/2016   Procedure: IRRIGATION AND DEBRIDEMENT WOUND;  Surgeon: Florene Glen, MD;  Location: [facility] ORS;  Service: General;  Laterality: Left;    Medical History: Past Medical History:  Diagnosis Date  . Asthma   . Chronic atrial fibrillation    a. diagnosed in 09/2016; b. failed flecainide and propafenone due to LE swelling and SOB, could not afford Multaq; c. CHADS2VASc => 3 (HTN, age x 1, female); d. on Eliquis  . GERD (gastroesophageal reflux disease)   . History of stress test    a. Lexiscan Myoview 10/2016: no evidence of ischemia, EF 53%  . Hypertension   . Obesity   . Obstructive sleep apnea   . Pulmonary hypertension ([place])   . Systolic dysfunction    a. TTE 10/2016: EF 50%, mild LVH, moderately dilated LA, moderate MR/TR, mild pulmonary hypertension    Family History: Family History  Problem Relation Age of Onset  . Dementia Mother   . Osteoporosis Mother   . Vascular Disease Mother   . COPD Father     Social History   Socioeconomic History  . Marital status: Married    Spouse name: Not on file  . Number of children: Not on file  . Years of education: Not on file  . Highest education level: Not on file  Occupational History  . Not on file  Social Needs  . Financial resource strain: Not on file  . Food insecurity:    Worry: Not on file    Inability: Not on file  . Transportation needs:    Medical: Not on  file    Non-medical: Not on file  Tobacco Use  . Smoking status: Never Smoker  . Smokeless tobacco: Never Used  Substance and Sexual Activity  . Alcohol use: No  . Drug use: No  . Sexual activity: Not on file  Lifestyle  . Physical activity:    Days per week: Not on file    Minutes per session: Not on file  . Stress: Not on file  Relationships  . Social connections:    Talks on phone: Not on file    Gets together: Not on file    Attends religious service: Not on file    Active member of club or organization: Not on file    Attends meetings of clubs or organizations: Not on file    Relationship status: Not on  file  . Intimate partner violence:    Fear of current or ex partner: Not on file    Emotionally abused: Not on file    Physically abused: Not on file    Forced sexual activity: Not on file  Other Topics Concern  . Not on file  Social History Narrative  . Not on file      Review of Systems  Constitutional: Positive for activity change, chills and fatigue. Negative for fever and unexpected weight change.  HENT: Positive for congestion, postnasal drip, rhinorrhea, sinus pressure, sinus pain and sore throat. Negative for sneezing.   Respiratory: Positive for cough, shortness of breath and wheezing. Negative for chest tightness.   Cardiovascular: Negative for chest pain and palpitations.  Gastrointestinal: Negative for abdominal pain, constipation, diarrhea, nausea and vomiting.  Endocrine: Negative for cold intolerance, heat intolerance, polydipsia and polyuria.  Musculoskeletal: Positive for arthralgias. Negative for back pain, joint swelling and neck pain.  Skin: Positive for rash.       Swelling and redness of bilateral lower legs. No open areas present. Overall, improvement since last visit.   Allergic/Immunologic: Positive for environmental allergies.  Neurological: Positive for headaches. Negative for tremors and numbness.  Hematological: Negative for adenopathy.  Does not bruise/bleed easily.  Psychiatric/Behavioral: Negative for behavioral problems (Depression), sleep disturbance and suicidal ideas. The patient is nervous/anxious.     Today's Vitals   02/03/19 0846  BP: 140/82  Pulse: 60  Resp: 16  Temp: 97.7 F (36.5 C)  SpO2: 99%  Weight: 268 lb (121.6 kg)  Height: 5\' 4"  (1.626 m)   Body mass index is 46 kg/m.  Physical Exam Vitals signs and nursing note reviewed.  Constitutional:      Appearance: She is well-developed. She is ill-appearing.     Comments: Moderate obesity  HENT:     Head: Normocephalic and atraumatic.     Right Ear: Tympanic membrane normal.     Left Ear: Tympanic membrane normal.     Nose: Congestion present. No rhinorrhea.     Right Sinus: Maxillary sinus tenderness and frontal sinus tenderness present.     Left Sinus: Maxillary sinus tenderness and frontal sinus tenderness present.     Mouth/Throat:     Pharynx: Posterior oropharyngeal erythema present.  Eyes:     Conjunctiva/sclera: Conjunctivae normal.     Pupils: Pupils are equal, round, and reactive to light.  Neck:     Musculoskeletal: Normal range of motion and neck supple.     Thyroid: No thyromegaly.     Vascular: No JVD.     Trachea: No tracheal deviation.  Cardiovascular:     Rate and Rhythm: Tachycardia present. Rhythm irregular. Frequent extrasystoles are present.    Heart sounds: Murmur present.  Pulmonary:     Effort: Pulmonary effort is normal. No accessory muscle usage or respiratory distress.     Breath sounds: Wheezing present.  Chest:     Breasts:        Right: No inverted nipple, mass, nipple discharge, skin change or tenderness.        Left: No inverted nipple, mass, nipple discharge, skin change or tenderness.  Abdominal:     General: Bowel sounds are normal.     Palpations: Abdomen is soft.     Tenderness: There is no abdominal tenderness.  Musculoskeletal: Normal range of motion.  Lymphadenopathy:     Cervical: Cervical  adenopathy present.  Skin:    General: Skin is warm and dry.  Comments: Swelling and redness of bilateral lower legs present, but improved. Mild tenderness present. No open lesions.   Neurological:     Mental Status: She is alert and oriented to person, place, and time. Mental status is at baseline.  Psychiatric:        Behavior: Behavior normal.        Thought Content: Thought content normal.        Judgment: Judgment normal.    Assessment/Plan: 1. Flu-like symptoms - POCT Influenza A/B negative. Advised patient to rest and increase fluids. Use OTC medication to improve acute symptoms.   2. Chronic bronchitis with acute exacerbation (HCC) Add prednisone dose pack. Take as directed for 12 days. Try Duoneb solution. May use every 4 hours as needed for wheezing and shortness of breath. Continue all inhalers and respiratory medications as prescribed.  - ipratropium-albuterol (DUONEB) 0.5-2.5 (3) MG/3ML SOLN; Take 3 mLs by nebulization every 4 (four) hours as needed.  Dispense: 120 mL; Refill: 3  3. Nausea - ondansetron (ZOFRAN-ODT) 8 MG disintegrating tablet; Take 1 tablet (8 mg total) by mouth every 8 (eight) hours as needed for nausea or vomiting.  Dispense: 30 tablet; Refill: 0  54 Cellulitis of right leg Continue bactrim DS bid for additional 7 days.  - sulfamethoxazole-trimethoprim (BACTRIM DS,SEPTRA DS) 800-160 MG tablet; Take 1 tablet by mouth 2 (two) times daily.  Dispense: 14 tablet; Refill: 0  6. Oropharyngeal candidiasis Add fluconazole 150mg  once. Repeat in three days if needed. Continue nystatin solution four times daiy if needed.  - fluconazole (DIFLUCAN) 150 MG tablet; Take 1 tablet po once. May repeat dose in 3 days as needed for persistent symptoms.  Dispense: 3 tablet; Refill: 0 - nystatin (MYCOSTATIN) 100000 UNIT/ML suspension; Take 5 mLs (500,000 Units total) by mouth 4 (four) times daily.  Dispense: 200 mL; Refill: 1  7. Body mass index (BMI) 40.0-44.9, adult  (HCC) Restart phentermine after completion of antibiotics. Reduce calories to 1500 calories per day. Restart exercise  - phentermine (ADIPEX-P) 37.5 MG tablet; Take 1 tablet (37.5 mg total) by mouth daily.  Dispense: 30 tablet; Refill: 0  General Counseling: Aldeen verbalizes understanding of the findings of todays visit and agrees with plan of treatment. I have discussed any further diagnostic evaluation that may be needed or ordered today. We also reviewed her medications today. she has been encouraged to call the office with any questions or concerns that should arise related to todays visit.  Rest and increase fluids. Continue using OTC medication to control symptoms.    There is a liability release in patients' chart. There has been a 10 minute discussion about the side effects including but not limited to elevated blood pressure, anxiety, lack of sleep and dry mouth. Pt understands and will like to start/continue on appetite suppressant at this time. There will be one month RX given at the time of visit with proper follow up. Nova diet plan with restricted calories is given to the pt. Pt understands and agrees with  plan of treatment  This patient was seen by Leretha Pol FNP Collaboration with Dr [name] as a part of collaborative care agreement  Orders Placed This Encounter  Procedures  . POCT Influenza A/B    Meds ordered this encounter  Medications  . predniSONE (STERAPRED UNI-PAK 48 TAB) 10 MG (48) TBPK tablet    Sig: 12 day taper - take by mouth as directed for 12 days    Dispense:  48 tablet  Refill:  0    Order Specific Question:   Supervising Provider    Answer:   [name] [[id number]]  . ondansetron (ZOFRAN-ODT) 8 MG disintegrating tablet    Sig: Take 1 tablet (8 mg total) by mouth every 8 (eight) hours as needed for nausea or vomiting.    Dispense:  30 tablet    Refill:  0    Order Specific Question:   Supervising Provider    Answer:   [name] [[id number]]   . fluconazole (DIFLUCAN) 150 MG tablet    Sig: Take 1 tablet po once. May repeat dose in 3 days as needed for persistent symptoms.    Dispense:  3 tablet    Refill:  0    Order Specific Question:   Supervising Provider    Answer:   [name] [[id number]]  . nystatin (MYCOSTATIN) 100000 UNIT/ML suspension    Sig: Take 5 mLs (500,000 Units total) by mouth 4 (four) times daily.    Dispense:  200 mL    Refill:  1    Order Specific Question:   Supervising Provider    Answer:   [name] [[id number]]  . ipratropium-albuterol (DUONEB) 0.5-2.5 (3) MG/3ML SOLN    Sig: Take 3 mLs by nebulization every 4 (four) hours as needed.    Dispense:  120 mL    Refill:  3    Order Specific Question:   Supervising Provider    Answer:   [name] [[id number]]  . sulfamethoxazole-trimethoprim (BACTRIM DS,SEPTRA DS) 800-160 MG tablet    Sig: Take 1 tablet by mouth 2 (two) times daily.    Dispense:  14 tablet    Refill:  0    Please d/c cipro due to interaction between cipro and theophylline.    Order Specific Question:   Supervising Provider    Answer:   [name] [[id number]]  . phentermine (ADIPEX-P) 37.5 MG tablet    Sig: Take 1 tablet (37.5 mg total) by mouth daily.    Dispense:  30 tablet    Refill:  0    Order Specific Question:   Supervising Provider    Answer:   [name] [[id number]]    Time spent: 29 Minutes      Dr [name] Internal medicine

## 2019-02-17 ENCOUNTER — Other Ambulatory Visit: Payer: Self-pay | Admitting: Nurse Practitioner

## 2019-02-17 ENCOUNTER — Telehealth: Payer: Self-pay

## 2019-02-17 DIAGNOSIS — L03115 Cellulitis of right lower limb: Secondary | ICD-10-CM

## 2019-02-17 DIAGNOSIS — M7051 Other bursitis of knee, right knee: Secondary | ICD-10-CM

## 2019-02-17 MED ORDER — PREDNISONE 10 MG (48) PO TBPK: ORAL_TABLET | ORAL | 0 refills | Status: DC

## 2019-02-17 MED ORDER — SULFAMETHOXAZOLE-TRIMETHOPRIM 800-160 MG PO TABS: 1.0000 | ORAL_TABLET | Freq: Two times a day (BID) | ORAL | 0 refills | Status: DC

## 2019-02-17 NOTE — Telephone Encounter (Signed)
Patient still feeling sick after abx and steroid. Will repeat treatment with bactrim DS bid and prednisone 12 day dose pack. Take as directed. Recommend OTC pepcid or famotidine twice daily as needed for reflux. Sent to [facility] [address]

## 2019-02-17 NOTE — Progress Notes (Signed)
Patient still feeling sick after abx and steroid. Will repeat treatment with bactrim DS bid and prednisone 12 day dose pack. Take as directed. Recommend OTC pepcid or famotidine twice daily as needed for reflux. Sent to [facility] [address]

## 2019-02-18 ENCOUNTER — Telehealth: Payer: Self-pay

## 2019-02-18 NOTE — Telephone Encounter (Signed)
Pt called wanting  A rx for her oxygen concentrator so that she can buy her another one due to her old concentrator needing to be replaced and she needs a prescription for it, informed pt that she has to come in to get the prescription, and have face to face visit with pulmonary provider.

## 2019-02-21 ENCOUNTER — Ambulatory Visit: Payer: Self-pay | Admitting: Adult Health

## 2019-02-28 ENCOUNTER — Ambulatory Visit (INDEPENDENT_AMBULATORY_CARE_PROVIDER_SITE_OTHER): Payer: BLUE CROSS/BLUE SHIELD | Admitting: Nurse Practitioner

## 2019-02-28 ENCOUNTER — Encounter: Payer: Self-pay | Admitting: Nurse Practitioner

## 2019-02-28 ENCOUNTER — Other Ambulatory Visit: Payer: Self-pay

## 2019-02-28 VITALS — BP 123/73 | HR 75 | Resp 16 | Ht 64.0 in | Wt 267.6 lb

## 2019-02-28 DIAGNOSIS — L03115 Cellulitis of right lower limb: Secondary | ICD-10-CM | POA: Diagnosis not present

## 2019-02-28 DIAGNOSIS — Z6841 Body Mass Index (BMI) 40.0 and over, adult: Secondary | ICD-10-CM

## 2019-02-28 DIAGNOSIS — J4550 Severe persistent asthma, uncomplicated: Secondary | ICD-10-CM

## 2019-02-28 MED ORDER — PHENTERMINE HCL 37.5 MG PO TABS: 37.5000 mg | ORAL_TABLET | Freq: Every day | ORAL | 0 refills | Status: DC

## 2019-02-28 NOTE — Progress Notes (Signed)
[facility] [place], [place] [address]  Internal MEDICINE  Office Visit Note  Patient Name: Laura [name]  [id number]  [id number]  Date of Service: 02/28/2019  Chief Complaint  Patient presents with  . Medical Management of Chronic Issues    4wk follow up weight management    The patient is here for follow up of weight management. She has been off of phentermine for a few weeks. Had been taking multiple rounds of antibiotics and prednisone due to cellulitis infection and bursitis in her knee. She is now feeling much better. Has lost one pound since her last visit. She is ready to go back on her phentermine to help suppress appetite and help her lose more weight. Losing weight has helped her breathing and her knees feel better when she is at lower weight.       Current Medication: Outpatient Encounter Medications as of 02/28/2019  Medication Sig  . acetaminophen-codeine (TYLENOL #3) 300-30 MG tablet Take 1 tablet by mouth every 8 (eight) hours as needed for moderate pain.  Marland Kitchen ALPRAZolam (XANAX) 0.25 MG tablet Take 1 tablet (0.25 mg total) by mouth 2 (two) times daily.  Marland Kitchen dexlansoprazole (DEXILANT) 60 MG capsule Take 60 mg by mouth daily.   Marland Kitchen ELIQUIS 5 MG TABS tablet TAKE 1 TABLET (5 MG TOTAL) BY MOUTH 2 (TWO) TIMES DAILY.  Marland Kitchen EPINEPHrine 0.3 mg/0.3 mL IJ SOAJ injection Inject into the muscle.  . fluconazole (DIFLUCAN) 150 MG tablet Take 1 tablet po once. May repeat dose in 3 days as needed for persistent symptoms.  . furosemide (LASIX) 40 MG tablet TAKE 1 TABLET BY MOUTH EVERY DAY  . guaiFENesin (MUCINEX) 600 MG 12 hr tablet Take 600 mg by mouth 2 (two) times daily as needed.  Marland Kitchen ibuprofen (ADVIL,MOTRIN) 200 MG tablet Take by mouth.  . Ipratropium-Albuterol (COMBIVENT RESPIMAT) 20-100 MCG/ACT AERS respimat Inhale 1 puff into the lungs 4 (four) times daily as needed. For shortness of breath/wheezing.  Marland Kitchen ipratropium-albuterol (DUONEB) 0.5-2.5 (3) MG/3ML SOLN Take  3 mLs by nebulization every 4 (four) hours as needed.  . levalbuterol (XOPENEX) 0.31 MG/3ML nebulizer solution Take 3 mLs by nebulization every 4 (four) hours as needed. For wheezing/ and or shortness of breath.  . losartan (COZAAR) 25 MG tablet TAKE 1 TABLET BY MOUTH EVERY DAY  . montelukast (SINGULAIR) 10 MG tablet TAKE 1 TABLET BY MOUTH EVERY DAY  . Multiple Vitamin (MULTIVITAMIN) tablet Take 1 tablet by mouth daily.  Marland Kitchen nystatin (MYCOSTATIN) 100000 UNIT/ML suspension Take 5 mLs (500,000 Units total) by mouth 4 (four) times daily.  . ondansetron (ZOFRAN-ODT) 8 MG disintegrating tablet Take 1 tablet (8 mg total) by mouth every 8 (eight) hours as needed for nausea or vomiting.  . phentermine (ADIPEX-P) 37.5 MG tablet Take 1 tablet (37.5 mg total) by mouth daily.  . Probiotic Product (PROBIOTIC-10) CAPS Take 1 capsule by mouth 1 day or 1 dose.  . spironolactone (ALDACTONE) 25 MG tablet TAKE 1 TABLET BY MOUTH TWICE A DAY  . THEO-24 100 MG 24 hr capsule TAKE ONE CAPSULE BY MOUTH DAILY  . theophylline (THEO-24) 300 MG 24 hr capsule Take 300 mg by mouth daily.  . [DISCONTINUED] phentermine (ADIPEX-P) 37.5 MG tablet Take 1 tablet (37.5 mg total) by mouth daily.  Marland Kitchen diltiazem (CARDIZEM CD) 360 MG 24 hr capsule TAKE 1 CAPSULE (360 MG TOTAL) BY MOUTH DAILY.  Marland Kitchen predniSONE (STERAPRED UNI-PAK 48 TAB) 10 MG (48) TBPK tablet 12 day taper -  take by mouth as directed for 12 days (Patient not taking: Reported on 02/28/2019)  . sulfamethoxazole-trimethoprim (BACTRIM DS,SEPTRA DS) 800-160 MG tablet Take 1 tablet by mouth 2 (two) times daily. (Patient not taking: Reported on 02/28/2019)   No facility-administered encounter medications on file as of 02/28/2019.     Surgical History: Past Surgical History:  Procedure Laterality Date  . INCISION AND DRAINAGE ABSCESS Right 06/29/2016   Procedure: INCISION AND DRAINAGE ABSCESS;  Surgeon: Florene Glen, MD;  Location: [facility] ORS;  Service: General;  Laterality: Right;  .  INCISION AND DRAINAGE OF WOUND Left 06/29/2016   Procedure: IRRIGATION AND DEBRIDEMENT WOUND;  Surgeon: Florene Glen, MD;  Location: [facility] ORS;  Service: General;  Laterality: Left;    Medical History: Past Medical History:  Diagnosis Date  . Asthma   . Chronic atrial fibrillation    a. diagnosed in 09/2016; b. failed flecainide and propafenone due to LE swelling and SOB, could not afford Multaq; c. CHADS2VASc => 3 (HTN, age x 1, female); d. on Eliquis  . GERD (gastroesophageal reflux disease)   . History of stress test    a. Lexiscan Myoview 10/2016: no evidence of ischemia, EF 53%  . Hypertension   . Obesity   . Obstructive sleep apnea   . Pulmonary hypertension ([place])   . Systolic dysfunction    a. TTE 10/2016: EF 50%, mild LVH, moderately dilated LA, moderate MR/TR, mild pulmonary hypertension    Family History: Family History  Problem Relation Age of Onset  . Dementia Mother   . Osteoporosis Mother   . Vascular Disease Mother   . COPD Father     Social History   Socioeconomic History  . Marital status: Married    Spouse name: Not on file  . Number of children: Not on file  . Years of education: Not on file  . Highest education level: Not on file  Occupational History  . Not on file  Social Needs  . Financial resource strain: Not on file  . Food insecurity:    Worry: Not on file    Inability: Not on file  . Transportation needs:    Medical: Not on file    Non-medical: Not on file  Tobacco Use  . Smoking status: Never Smoker  . Smokeless tobacco: Never Used  Substance and Sexual Activity  . Alcohol use: No  . Drug use: No  . Sexual activity: Not on file  Lifestyle  . Physical activity:    Days per week: Not on file    Minutes per session: Not on file  . Stress: Not on file  Relationships  . Social connections:    Talks on phone: Not on file    Gets together: Not on file    Attends religious service: Not on file    Active member of club or  organization: Not on file    Attends meetings of clubs or organizations: Not on file    Relationship status: Not on file  . Intimate partner violence:    Fear of current or ex partner: Not on file    Emotionally abused: Not on file    Physically abused: Not on file    Forced sexual activity: Not on file  Other Topics Concern  . Not on file  Social History Narrative  . Not on file      Review of Systems  Constitutional: Positive for fatigue. Negative for activity change, chills, fever and unexpected weight change.  Weight loss of one pound since her last visit   HENT: Negative for congestion, postnasal drip, rhinorrhea, sinus pressure, sinus pain, sneezing and sore throat.   Respiratory: Positive for shortness of breath and wheezing. Negative for cough and chest tightness.   Cardiovascular: Negative for chest pain and palpitations.  Gastrointestinal: Negative for abdominal pain, constipation, diarrhea, nausea and vomiting.  Endocrine: Negative for cold intolerance, heat intolerance, polydipsia and polyuria.  Musculoskeletal: Positive for arthralgias. Negative for back pain, joint swelling and neck pain.  Skin: Positive for rash.       Swelling, redness, and pain in bilateral lower legs has improved significantly since her last visit .   Allergic/Immunologic: Positive for environmental allergies.  Neurological: Positive for headaches. Negative for tremors and numbness.  Hematological: Negative for adenopathy. Does not bruise/bleed easily.  Psychiatric/Behavioral: Negative for behavioral problems (Depression), sleep disturbance and suicidal ideas. The patient is nervous/anxious.     Today's Vitals   02/28/19 0950  BP: 123/73  Pulse: 75  Resp: 16  SpO2: 98%  Weight: 267 lb 9.6 oz (121.4 kg)  Height: 5\' 4"  (1.626 m)   Body mass index is 45.93 kg/m.  Physical Exam Vitals signs and nursing note reviewed.  Constitutional:      Appearance: Normal appearance. She is  well-developed. She is obese.     Comments: Moderate obesity  HENT:     Head: Normocephalic and atraumatic.     Right Ear: Tympanic membrane normal.     Left Ear: Tympanic membrane normal.     Nose: No rhinorrhea.     Right Sinus: Maxillary sinus tenderness and frontal sinus tenderness present.     Left Sinus: Maxillary sinus tenderness and frontal sinus tenderness present.     Mouth/Throat:     Mouth: Mucous membranes are moist.     Pharynx: Oropharynx is clear.  Eyes:     Conjunctiva/sclera: Conjunctivae normal.     Pupils: Pupils are equal, round, and reactive to light.  Neck:     Musculoskeletal: Normal range of motion and neck supple.     Thyroid: No thyromegaly.     Vascular: No JVD.     Trachea: No tracheal deviation.  Cardiovascular:     Rate and Rhythm: Normal rate. Rhythm irregular. Frequent extrasystoles are present.    Heart sounds: Murmur present.  Pulmonary:     Effort: Pulmonary effort is normal. No accessory muscle usage or respiratory distress.     Breath sounds: Normal breath sounds.  Chest:     Breasts:        Right: No inverted nipple, mass, nipple discharge, skin change or tenderness.        Left: No inverted nipple, mass, nipple discharge, skin change or tenderness.  Abdominal:     General: Bowel sounds are normal.     Palpations: Abdomen is soft.     Tenderness: There is no abdominal tenderness.  Musculoskeletal: Normal range of motion.  Skin:    General: Skin is warm and dry.     Comments: Swelling and redness of bilateral lower legs present, but improved significantly since she was last seen .  Neurological:     Mental Status: She is alert and oriented to person, place, and time. Mental status is at baseline.  Psychiatric:        Behavior: Behavior normal.        Thought Content: Thought content normal.        Judgment: Judgment normal.    Assessment/Plan:  1. Cellulitis of right leg Improved significantly since her last visit. Finished  antibiotics and prednisone treatment. Continue to monitor.   2. Severe persistent asthma without complication Continue to use inhalers and respiratory medications as prescribed   3. Body mass index (BMI) 40.0-44.9, adult (HCC) Restart phentermine 37.5mg  tablets daily. Limit calorie count to1200 per day with good balance of protein, veges, fruit, and carbs. Incorporate exercise into daily routine.  - phentermine (ADIPEX-P) 37.5 MG tablet; Take 1 tablet (37.5 mg total) by mouth daily.  Dispense: 30 tablet; Refill: 0  General Counseling: Metztli verbalizes understanding of the findings of todays visit and agrees with plan of treatment. I have discussed any further diagnostic evaluation that may be needed or ordered today. We also reviewed her medications today. she has been encouraged to call the office with any questions or concerns that should arise related to todays visit.   There is a liability release in patients' chart. There has been a 10 minute discussion about the side effects including but not limited to elevated blood pressure, anxiety, lack of sleep and dry mouth. Pt understands and will like to start/continue on appetite suppressant at this time. There will be one month RX given at the time of visit with proper follow up. Nova diet plan with restricted calories is given to the pt. Pt understands and agrees with  plan of treatment  This patient was seen by Leretha Pol FNP Collaboration with Dr [name] as a part of collaborative care agreement  Meds ordered this encounter  Medications  . phentermine (ADIPEX-P) 37.5 MG tablet    Sig: Take 1 tablet (37.5 mg total) by mouth daily.    Dispense:  30 tablet    Refill:  0    Order Specific Question:   Supervising Provider    Answer:   [name] [[id number]]    Time spent: 68 Minutes      Dr [name] Internal medicine

## 2019-03-16 ENCOUNTER — Ambulatory Visit (INDEPENDENT_AMBULATORY_CARE_PROVIDER_SITE_OTHER): Payer: BLUE CROSS/BLUE SHIELD | Admitting: Nurse Practitioner

## 2019-03-16 ENCOUNTER — Encounter: Payer: Self-pay | Admitting: Nurse Practitioner

## 2019-03-16 ENCOUNTER — Other Ambulatory Visit: Payer: Self-pay

## 2019-03-16 VITALS — BP 126/82 | HR 67 | Temp 97.8°F | Resp 18 | Ht 64.0 in | Wt 270.0 lb

## 2019-03-16 DIAGNOSIS — W19XXXA Unspecified fall, initial encounter: Secondary | ICD-10-CM | POA: Diagnosis not present

## 2019-03-16 DIAGNOSIS — Y92009 Unspecified place in unspecified non-institutional (private) residence as the place of occurrence of the external cause: Secondary | ICD-10-CM

## 2019-03-16 DIAGNOSIS — M79604 Pain in right leg: Secondary | ICD-10-CM

## 2019-03-16 DIAGNOSIS — S8011XA Contusion of right lower leg, initial encounter: Secondary | ICD-10-CM | POA: Insufficient documentation

## 2019-03-16 NOTE — Progress Notes (Signed)
[facility] [place], [place] [address]  Internal MEDICINE  Office Visit Note  Patient Name: Laura [name]  [id number]  [id number]  Date of Service: 03/16/2019    Pt is here for a sick visit.  Chief Complaint  Patient presents with  . Fall    right leg and left arm , missed a step and fell between tables and chairs      The patient states that she fell while outside with her grandchildren last Thursday. She landed on her knees and lower legs. Also hit he left arm on picnic table.. states that her right lower extremity became very bruised and swollen. Got very sore. She was concerned about blood clot, as she has had blood clot in her legs in the past. States that she has been resting and propping up her leg since her fall. Swelling has improved a great deal. Bruising is still present, however, it is still tender and area which was hit is still very sore with small abrasion present.       Current Medication:  Outpatient Encounter Medications as of 03/16/2019  Medication Sig  . acetaminophen-codeine (TYLENOL #3) 300-30 MG tablet Take 1 tablet by mouth every 8 (eight) hours as needed for moderate pain.  Marland Kitchen ALPRAZolam (XANAX) 0.25 MG tablet Take 1 tablet (0.25 mg total) by mouth 2 (two) times daily.  Marland Kitchen dexlansoprazole (DEXILANT) 60 MG capsule Take 60 mg by mouth daily.   Marland Kitchen ELIQUIS 5 MG TABS tablet TAKE 1 TABLET (5 MG TOTAL) BY MOUTH 2 (TWO) TIMES DAILY.  Marland Kitchen EPINEPHrine 0.3 mg/0.3 mL IJ SOAJ injection Inject into the muscle.  . fluconazole (DIFLUCAN) 150 MG tablet Take 1 tablet po once. May repeat dose in 3 days as needed for persistent symptoms.  . furosemide (LASIX) 40 MG tablet TAKE 1 TABLET BY MOUTH EVERY DAY  . guaiFENesin (MUCINEX) 600 MG 12 hr tablet Take 600 mg by mouth 2 (two) times daily as needed.  Marland Kitchen ibuprofen (ADVIL,MOTRIN) 200 MG tablet Take by mouth.  . Ipratropium-Albuterol (COMBIVENT RESPIMAT) 20-100 MCG/ACT AERS respimat Inhale 1 puff into  the lungs 4 (four) times daily as needed. For shortness of breath/wheezing.  Marland Kitchen ipratropium-albuterol (DUONEB) 0.5-2.5 (3) MG/3ML SOLN Take 3 mLs by nebulization every 4 (four) hours as needed.  . levalbuterol (XOPENEX) 0.31 MG/3ML nebulizer solution Take 3 mLs by nebulization every 4 (four) hours as needed. For wheezing/ and or shortness of breath.  . losartan (COZAAR) 25 MG tablet TAKE 1 TABLET BY MOUTH EVERY DAY  . montelukast (SINGULAIR) 10 MG tablet TAKE 1 TABLET BY MOUTH EVERY DAY  . Multiple Vitamin (MULTIVITAMIN) tablet Take 1 tablet by mouth daily.  Marland Kitchen nystatin (MYCOSTATIN) 100000 UNIT/ML suspension Take 5 mLs (500,000 Units total) by mouth 4 (four) times daily.  . ondansetron (ZOFRAN-ODT) 8 MG disintegrating tablet Take 1 tablet (8 mg total) by mouth every 8 (eight) hours as needed for nausea or vomiting.  . phentermine (ADIPEX-P) 37.5 MG tablet Take 1 tablet (37.5 mg total) by mouth daily.  . predniSONE (STERAPRED UNI-PAK 48 TAB) 10 MG (48) TBPK tablet 12 day taper - take by mouth as directed for 12 days  . Probiotic Product (PROBIOTIC-10) CAPS Take 1 capsule by mouth 1 day or 1 dose.  . spironolactone (ALDACTONE) 25 MG tablet TAKE 1 TABLET BY MOUTH TWICE A DAY  . sulfamethoxazole-trimethoprim (BACTRIM DS,SEPTRA DS) 800-160 MG tablet Take 1 tablet by mouth 2 (two) times daily.  Marland Kitchen THEO-24 100 MG  24 hr capsule TAKE ONE CAPSULE BY MOUTH DAILY  . theophylline (THEO-24) 300 MG 24 hr capsule Take 300 mg by mouth daily.  Marland Kitchen diltiazem (CARDIZEM CD) 360 MG 24 hr capsule TAKE 1 CAPSULE (360 MG TOTAL) BY MOUTH DAILY.   No facility-administered encounter medications on file as of 03/16/2019.       Medical History: Past Medical History:  Diagnosis Date  . Asthma   . Chronic atrial fibrillation    a. diagnosed in 09/2016; b. failed flecainide and propafenone due to LE swelling and SOB, could not afford Multaq; c. CHADS2VASc => 3 (HTN, age x 1, female); d. on Eliquis  . GERD (gastroesophageal  reflux disease)   . History of stress test    a. Lexiscan Myoview 10/2016: no evidence of ischemia, EF 53%  . Hypertension   . Obesity   . Obstructive sleep apnea   . Pulmonary hypertension ([place])   . Systolic dysfunction    a. TTE 10/2016: EF 50%, mild LVH, moderately dilated LA, moderate MR/TR, mild pulmonary hypertension     Today's Vitals   03/16/19 1002  BP: 126/82  Pulse: 67  Resp: 18  Temp: 97.8 F (36.6 C)  SpO2: 97%  Weight: 270 lb (122.5 kg)  Height: 5\' 4"  (1.626 m)   Body mass index is 46.35 kg/m.  Review of Systems  Constitutional: Positive for fatigue. Negative for chills and unexpected weight change.  HENT: Negative for congestion, postnasal drip, rhinorrhea, sneezing and sore throat.   Eyes: Negative for redness.  Respiratory: Positive for shortness of breath. Negative for cough and chest tightness.   Cardiovascular: Negative for chest pain and palpitations.  Musculoskeletal: Positive for arthralgias and joint swelling. Negative for back pain and neck pain.       Both lower extremities are sore and swollen, but right knee mor tender and swollen.   Skin: Negative for rash.       Bruising with abrasion present on right lower extremity.   Neurological: Negative for tremors and numbness.  Hematological: Negative for adenopathy. Does not bruise/bleed easily.  Psychiatric/Behavioral: Behavioral problem: Depression.    Physical Exam Vitals signs and nursing note reviewed.  Constitutional:      General: She is not in acute distress.    Appearance: She is well-developed. She is obese. She is not diaphoretic.  HENT:     Head: Normocephalic and atraumatic.     Mouth/Throat:     Pharynx: No oropharyngeal exudate.  Eyes:     Pupils: Pupils are equal, round, and reactive to light.  Neck:     Musculoskeletal: Normal range of motion and neck supple.     Thyroid: No thyromegaly.     Vascular: No JVD.     Trachea: No tracheal deviation.  Cardiovascular:     Rate  and Rhythm: Normal rate. Rhythm irregular.     Heart sounds: Normal heart sounds. No murmur. No friction rub. No gallop.   Pulmonary:     Effort: Pulmonary effort is normal. No respiratory distress.     Breath sounds: Normal breath sounds. No wheezing or rales.  Chest:     Chest wall: No tenderness.  Musculoskeletal: Normal range of motion.     Comments: Right knee and right lower extremity moderately swollen with tenderness upon palpation. Hurts more when walking or putting weight on the leg.   Lymphadenopathy:     Cervical: No cervical adenopathy.  Skin:    General: Skin is warm and dry.  Findings: Bruising present.     Comments: Moderate bruising with edema present in right lower extremity. Small, superficial abrasion noted in mid-anterior aspect of the right loer leg. No evidence of infection or cellulitis noted at this time.   Neurological:     Mental Status: She is alert and oriented to person, place, and time.     Cranial Nerves: No cranial nerve deficit.  Psychiatric:        Behavior: Behavior normal.        Thought Content: Thought content normal.        Judgment: Judgment normal.   Assessment/Plan:  1. Fall at home, initial encounter Resulted in bruising of bilateral lower legs, worse on the right lower leg. Improving as expected. Advised patient to monitor closely.   2. Contusion of right lower leg, initial encounter Advised rest, ice, and elevation of lower leg. Monitor for signs of worsening or increased bruising. Expected healing process noted at this time.   3. Pain in right leg Following fall at home. May take OTC tylenol or previously prescribed pain medication as needed and as prescribed. Monitor closly and notify the office for any evidence or cellulitis or other abnormality which develops from this point forward.   General Counseling: valori hollenkamp understanding of the findings of todays visit and agrees with plan of treatment. I have discussed any further  diagnostic evaluation that may be needed or ordered today. We also reviewed her medications today. she has been encouraged to call the office with any questions or concerns that should arise related to todays visit.    Counseling:  This patient was seen by Leretha Pol FNP Collaboration with Dr [name] as a part of collaborative care agreement   Time spent: 15 Minutes

## 2019-03-28 ENCOUNTER — Other Ambulatory Visit: Payer: Self-pay | Admitting: Nurse Practitioner

## 2019-03-28 MED ORDER — IPRATROPIUM-ALBUTEROL 20-100 MCG/ACT IN AERS: 1.0000 | INHALATION_SPRAY | Freq: Four times a day (QID) | RESPIRATORY_TRACT | 1 refills | Status: DC | PRN

## 2019-04-04 ENCOUNTER — Other Ambulatory Visit: Payer: Self-pay | Admitting: Nurse Practitioner

## 2019-04-04 ENCOUNTER — Telehealth: Payer: Self-pay | Admitting: Nurse Practitioner

## 2019-04-04 DIAGNOSIS — M7051 Other bursitis of knee, right knee: Secondary | ICD-10-CM

## 2019-04-04 DIAGNOSIS — L03115 Cellulitis of right lower limb: Secondary | ICD-10-CM

## 2019-04-04 MED ORDER — SULFAMETHOXAZOLE-TRIMETHOPRIM 800-160 MG PO TABS: 1.0000 | ORAL_TABLET | Freq: Two times a day (BID) | ORAL | 0 refills | Status: DC

## 2019-04-04 MED ORDER — PREDNISONE 10 MG (48) PO TBPK: ORAL_TABLET | ORAL | 0 refills | Status: DC

## 2019-04-04 MED ORDER — ALBUTEROL SULFATE HFA 108 (90 BASE) MCG/ACT IN AERS: 2.0000 | INHALATION_SPRAY | RESPIRATORY_TRACT | 2 refills | Status: DC | PRN

## 2019-04-04 NOTE — Telephone Encounter (Signed)
Patient having new cellulitis and swelling after fall. Sent bactrim bid for 14 days and added prednisoe 12 day taper. Take as directed. Both sent to her pharmacy.

## 2019-04-04 NOTE — Telephone Encounter (Signed)
Patient called and left a message on voicemail follow up from her last visit fall, she states that she needs prednisone and antibiotic she states that she has cellulitis on leg

## 2019-04-04 NOTE — Progress Notes (Signed)
Patient having new cellulitis and swelling after fall. Sent bactrim bid for 14 days and added prednisoe 12 day taper. Take as directed. Both sent to her pharmacy.

## 2019-04-08 ENCOUNTER — Other Ambulatory Visit: Payer: Self-pay | Admitting: Internal Medicine

## 2019-04-11 ENCOUNTER — Telehealth: Payer: Self-pay | Admitting: Internal Medicine

## 2019-04-11 NOTE — Telephone Encounter (Signed)
Medication theo 24 has been approved by [employer] effective dates 04/08/19-04/05/2022 Ref#[id number] [facility] contacted

## 2019-04-14 ENCOUNTER — Ambulatory Visit: Payer: Self-pay | Admitting: Nurse Practitioner

## 2019-04-19 ENCOUNTER — Encounter: Payer: Self-pay | Admitting: Nurse Practitioner

## 2019-04-19 ENCOUNTER — Telehealth: Payer: Self-pay | Admitting: Nurse Practitioner

## 2019-04-19 ENCOUNTER — Ambulatory Visit (INDEPENDENT_AMBULATORY_CARE_PROVIDER_SITE_OTHER): Payer: BC Managed Care – PPO | Admitting: Nurse Practitioner

## 2019-04-19 ENCOUNTER — Other Ambulatory Visit: Payer: Self-pay

## 2019-04-19 DIAGNOSIS — K2100 Gastro-esophageal reflux disease with esophagitis, without bleeding: Secondary | ICD-10-CM

## 2019-04-19 DIAGNOSIS — B370 Candidal stomatitis: Secondary | ICD-10-CM | POA: Diagnosis not present

## 2019-04-19 DIAGNOSIS — J4550 Severe persistent asthma, uncomplicated: Secondary | ICD-10-CM | POA: Diagnosis not present

## 2019-04-19 DIAGNOSIS — L03115 Cellulitis of right lower limb: Secondary | ICD-10-CM

## 2019-04-19 MED ORDER — NYSTATIN 100000 UNIT/ML MT SUSP: 5.0000 mL | Freq: Four times a day (QID) | OROMUCOSAL | 1 refills | Status: DC

## 2019-04-19 MED ORDER — PREDNISONE 10 MG PO TABS: 10.0000 mg | ORAL_TABLET | Freq: Every day | ORAL | 5 refills | Status: DC

## 2019-04-19 MED ORDER — THEOPHYLLINE ER 300 MG PO CP24: 300.0000 mg | ORAL_CAPSULE | Freq: Every day | ORAL | 3 refills | Status: DC

## 2019-04-19 MED ORDER — DEXLANSOPRAZOLE 60 MG PO CPDR: 60.0000 mg | DELAYED_RELEASE_CAPSULE | Freq: Every day | ORAL | 3 refills | Status: DC

## 2019-04-19 MED ORDER — CLINDAMYCIN HCL 300 MG PO CAPS: 300.0000 mg | ORAL_CAPSULE | Freq: Three times a day (TID) | ORAL | 0 refills | Status: DC

## 2019-04-19 NOTE — Progress Notes (Signed)
[facility], [place] [address]  Internal MEDICINE  Telephone Visit  Patient Name: Laura [name]  [id number]  [id number]  Date of Service: 05/08/2019  I connected with the patient at 1:45pm by telephone and verified the patients identity using two identifiers.   I discussed the limitations, risks, security and privacy concerns of performing an evaluation and management service by telephone and the availability of in person appointments. I also discussed with the patient that there may be a patient responsible charge related to the service.  The patient expressed understanding and agrees to proceed.    Chief Complaint  Patient presents with  . Telephone Assessment  . Telephone Screen  . Nausea  . Pain    right leg    The patient has been contacted via telephone for follow up visit due to concerns for spread of novel coronavirus. The patient is c/o nausea and reduced appetite. She has started having pain and swelling again in lower right leg. Gets shiny and has knot on front aspect which turns nearly black after a full day at work. Pain is significant. She has been treated with multiple rounds of antibiotics as well as prednisone tapers. The leg pain and swelling/cellultis gets better with treatment, then gets worse as soon as treatment finishes. She is severely fatigued. So tired it is difficult for her drive after work. She will often have her husband pick her up from work so she does not fall asleep at the wheel.      Current Medication: Outpatient Encounter Medications as of 04/19/2019  Medication Sig  . acetaminophen-codeine (TYLENOL #3) 300-30 MG tablet Take 1 tablet by mouth every 8 (eight) hours as needed for moderate pain.  Marland Kitchen albuterol (PROVENTIL HFA;VENTOLIN HFA) 108 (90 Base) MCG/ACT inhaler Inhale 2 puffs into the lungs every 4 (four) hours as needed for wheezing or shortness of breath.  . ALPRAZolam (XANAX) 0.25 MG tablet Take 1 tablet  (0.25 mg total) by mouth 2 (two) times daily.  Marland Kitchen dexlansoprazole (DEXILANT) 60 MG capsule Take 1 capsule (60 mg total) by mouth daily.  Marland Kitchen ELIQUIS 5 MG TABS tablet TAKE 1 TABLET (5 MG TOTAL) BY MOUTH 2 (TWO) TIMES DAILY.  Marland Kitchen EPINEPHrine 0.3 mg/0.3 mL IJ SOAJ injection Inject into the muscle.  . fluconazole (DIFLUCAN) 150 MG tablet Take 1 tablet po once. May repeat dose in 3 days as needed for persistent symptoms.  Marland Kitchen guaiFENesin (MUCINEX) 600 MG 12 hr tablet Take 600 mg by mouth 2 (two) times daily as needed.  Marland Kitchen ibuprofen (ADVIL,MOTRIN) 200 MG tablet Take by mouth.  Marland Kitchen ipratropium-albuterol (DUONEB) 0.5-2.5 (3) MG/3ML SOLN Take 3 mLs by nebulization every 4 (four) hours as needed.  . levalbuterol (XOPENEX) 0.31 MG/3ML nebulizer solution Take 3 mLs by nebulization every 4 (four) hours as needed. For wheezing/ and or shortness of breath.  . losartan (COZAAR) 25 MG tablet TAKE 1 TABLET BY MOUTH EVERY DAY  . Multiple Vitamin (MULTIVITAMIN) tablet Take 1 tablet by mouth daily.  Marland Kitchen nystatin (MYCOSTATIN) 100000 UNIT/ML suspension Take 5 mLs (500,000 Units total) by mouth 4 (four) times daily.  . ondansetron (ZOFRAN-ODT) 8 MG disintegrating tablet Take 1 tablet (8 mg total) by mouth every 8 (eight) hours as needed for nausea or vomiting.  . phentermine (ADIPEX-P) 37.5 MG tablet Take 1 tablet (37.5 mg total) by mouth daily.  . Probiotic Product (PROBIOTIC-10) CAPS Take 1 capsule by mouth 1 day or 1 dose.  . theophylline (THEO-24) 300  MG 24 hr capsule Take 1 capsule (300 mg total) by mouth daily.  . [DISCONTINUED] dexlansoprazole (DEXILANT) 60 MG capsule Take 60 mg by mouth daily.   . [DISCONTINUED] furosemide (LASIX) 40 MG tablet TAKE 1 TABLET BY MOUTH EVERY DAY  . [DISCONTINUED] montelukast (SINGULAIR) 10 MG tablet TAKE 1 TABLET BY MOUTH EVERY DAY  . [DISCONTINUED] nystatin (MYCOSTATIN) 100000 UNIT/ML suspension Take 5 mLs (500,000 Units total) by mouth 4 (four) times daily.  . [DISCONTINUED] predniSONE  (STERAPRED UNI-PAK 48 TAB) 10 MG (48) TBPK tablet 12 day taper - take by mouth as directed for 12 days  . [DISCONTINUED] spironolactone (ALDACTONE) 25 MG tablet TAKE 1 TABLET BY MOUTH TWICE A DAY  . [DISCONTINUED] sulfamethoxazole-trimethoprim (BACTRIM DS,SEPTRA DS) 800-160 MG tablet Take 1 tablet by mouth 2 (two) times daily.  . [DISCONTINUED] THEO-24 100 MG 24 hr capsule TAKE 1 CAPSULE BY MOUTH EVERY DAY  . [DISCONTINUED] theophylline (THEO-24) 300 MG 24 hr capsule Take 300 mg by mouth daily.  . clindamycin (CLEOCIN) 300 MG capsule Take 1 capsule (300 mg total) by mouth 3 (three) times daily.  Marland Kitchen diltiazem (CARDIZEM CD) 360 MG 24 hr capsule TAKE 1 CAPSULE (360 MG TOTAL) BY MOUTH DAILY.  Marland Kitchen predniSONE (DELTASONE) 10 MG tablet Take 1 tablet (10 mg total) by mouth daily with breakfast.   No facility-administered encounter medications on file as of 04/19/2019.     Surgical History: Past Surgical History:  Procedure Laterality Date  . INCISION AND DRAINAGE ABSCESS Right 06/29/2016   Procedure: INCISION AND DRAINAGE ABSCESS;  Surgeon: Florene Glen, MD;  Location: [facility] ORS;  Service: General;  Laterality: Right;  . INCISION AND DRAINAGE OF WOUND Left 06/29/2016   Procedure: IRRIGATION AND DEBRIDEMENT WOUND;  Surgeon: Florene Glen, MD;  Location: [facility] ORS;  Service: General;  Laterality: Left;    Medical History: Past Medical History:  Diagnosis Date  . Asthma   . Chronic atrial fibrillation    a. diagnosed in 09/2016; b. failed flecainide and propafenone due to LE swelling and SOB, could not afford Multaq; c. CHADS2VASc => 3 (HTN, age x 1, female); d. on Eliquis  . GERD (gastroesophageal reflux disease)   . History of stress test    a. Lexiscan Myoview 10/2016: no evidence of ischemia, EF 53%  . Hypertension   . Obesity   . Obstructive sleep apnea   . Pulmonary hypertension ([place])   . Systolic dysfunction    a. TTE 10/2016: EF 50%, mild LVH, moderately dilated LA, moderate MR/TR, mild  pulmonary hypertension    Family History: Family History  Problem Relation Age of Onset  . Dementia Mother   . Osteoporosis Mother   . Vascular Disease Mother   . COPD Father     Social History   Socioeconomic History  . Marital status: Married    Spouse name: Not on file  . Number of children: Not on file  . Years of education: Not on file  . Highest education level: Not on file  Occupational History  . Not on file  Social Needs  . Financial resource strain: Not on file  . Food insecurity:    Worry: Not on file    Inability: Not on file  . Transportation needs:    Medical: Not on file    Non-medical: Not on file  Tobacco Use  . Smoking status: Never Smoker  . Smokeless tobacco: Never Used  Substance and Sexual Activity  . Alcohol use: No  . Drug  use: No  . Sexual activity: Not on file  Lifestyle  . Physical activity:    Days per week: Not on file    Minutes per session: Not on file  . Stress: Not on file  Relationships  . Social connections:    Talks on phone: Not on file    Gets together: Not on file    Attends religious service: Not on file    Active member of club or organization: Not on file    Attends meetings of clubs or organizations: Not on file    Relationship status: Not on file  . Intimate partner violence:    Fear of current or ex partner: Not on file    Emotionally abused: Not on file    Physically abused: Not on file    Forced sexual activity: Not on file  Other Topics Concern  . Not on file  Social History Narrative  . Not on file      Review of Systems  Constitutional: Positive for activity change and fatigue. Negative for chills and unexpected weight change.  HENT: Negative for congestion, postnasal drip, rhinorrhea, sneezing and sore throat.   Respiratory: Positive for shortness of breath and wheezing. Negative for cough and chest tightness.   Cardiovascular: Positive for leg swelling. Negative for chest pain and palpitations.   Gastrointestinal: Positive for nausea.       Decreased appetite  Musculoskeletal: Positive for arthralgias and joint swelling. Negative for back pain and neck pain.       Both lower extremities are sore and swollen, but right knee mor tender and swollen.   Skin: Negative for rash.       Bruising with abrasion present on right lower extremity.   Neurological: Positive for headaches. Negative for dizziness, tremors and numbness.  Hematological: Positive for adenopathy. Does not bruise/bleed easily.  Psychiatric/Behavioral: Behavioral problem: Depression. The patient is nervous/anxious.     Vital Signs: There were no vitals taken for this visit.   Observation/Objective:  The patient is alert and oriented. She sounds worried and in mild distress. Breathing is non-labored at this time.    Assessment/Plan: 1. Cellulitis of right leg Will do round clindomycin 300mg  three times daily for 14 days. Advised her to rest and elevate her leg when possible. Will x-ray the right tib/fib for further evaluation.  - clindamycin (CLEOCIN) 300 MG capsule; Take 1 capsule (300 mg total) by mouth 3 (three) times daily.  Dispense: 42 capsule; Refill: 0 - DG Tibia/Fibula Right; Future  2. Oropharyngeal candidiasis Renew nystatin 100000 suspension - swish and spit with suspension four times daily to prevent thrush infection.  - nystatin (MYCOSTATIN) 100000 UNIT/ML suspension; Take 5 mLs (500,000 Units total) by mouth 4 (four) times daily.  Dispense: 200 mL; Refill: 1  3. Gastroesophageal reflux disease with esophagitis Continue dexilant 60mg  daily.  - dexlansoprazole (DEXILANT) 60 MG capsule; Take 1 capsule (60 mg total) by mouth daily.  Dispense: 30 capsule; Refill: 3  4. Severe persistent asthma without complication Continue all inhalers and respiratory medications as prescribed. Refills were provided today.   - theophylline (THEO-24) 300 MG 24 hr capsule; Take 1 capsule (300 mg total) by mouth daily.   Dispense: 30 capsule; Refill: 3 - predniSONE (DELTASONE) 10 MG tablet; Take 1 tablet (10 mg total) by mouth daily with breakfast.  Dispense: 30 tablet; Refill: 5  General Counseling: Mychelle verbalizes understanding of the findings of today's phone visit and agrees with plan of treatment. I have discussed  any further diagnostic evaluation that may be needed or ordered today. We also reviewed her medications today. she has been encouraged to call the office with any questions or concerns that should arise related to todays visit.  This patient was seen by Leretha Pol FNP Collaboration with Dr [name] as a part of collaborative care agreement  Orders Placed This Encounter  Procedures  . DG Tibia/Fibula Right    Meds ordered this encounter  Medications  . theophylline (THEO-24) 300 MG 24 hr capsule    Sig: Take 1 capsule (300 mg total) by mouth daily.    Dispense:  30 capsule    Refill:  3    Order Specific Question:   Supervising Provider    Answer:   [name] [[id number]]  . nystatin (MYCOSTATIN) 100000 UNIT/ML suspension    Sig: Take 5 mLs (500,000 Units total) by mouth 4 (four) times daily.    Dispense:  200 mL    Refill:  1    Order Specific Question:   Supervising Provider    Answer:   [name] [[id number]]  . clindamycin (CLEOCIN) 300 MG capsule    Sig: Take 1 capsule (300 mg total) by mouth 3 (three) times daily.    Dispense:  42 capsule    Refill:  0    Order Specific Question:   Supervising Provider    Answer:   [name] [[id number]]  . dexlansoprazole (DEXILANT) 60 MG capsule    Sig: Take 1 capsule (60 mg total) by mouth daily.    Dispense:  30 capsule    Refill:  3    Order Specific Question:   Supervising Provider    Answer:   [name] [[id number]]  . predniSONE (DELTASONE) 10 MG tablet    Sig: Take 1 tablet (10 mg total) by mouth daily with breakfast.    Dispense:  30 tablet    Refill:  5    Order Specific Question:   Supervising Provider    Answer:   [name] [[id number]]    Time spent: 18 Minutes    Dr [name] Internal medicine

## 2019-04-19 NOTE — Telephone Encounter (Signed)
Prior authorization for Dexilant 60mg  Approvedtoday Effective from 04/19/2019 through 04/17/2022 Pharmacy contacted

## 2019-04-21 ENCOUNTER — Other Ambulatory Visit: Payer: Self-pay

## 2019-04-21 MED ORDER — FUROSEMIDE 40 MG PO TABS: 40.0000 mg | ORAL_TABLET | Freq: Every day | ORAL | 1 refills | Status: DC

## 2019-04-23 ENCOUNTER — Other Ambulatory Visit: Payer: Self-pay | Admitting: Internal Medicine

## 2019-04-28 ENCOUNTER — Other Ambulatory Visit: Payer: Self-pay

## 2019-04-28 MED ORDER — SPIRONOLACTONE 25 MG PO TABS: 25.0000 mg | ORAL_TABLET | Freq: Two times a day (BID) | ORAL | 0 refills | Status: DC

## 2019-05-08 DIAGNOSIS — K2100 Gastro-esophageal reflux disease with esophagitis, without bleeding: Secondary | ICD-10-CM | POA: Insufficient documentation

## 2019-05-14 ENCOUNTER — Other Ambulatory Visit: Payer: Self-pay | Admitting: Cardiovascular Disease

## 2019-05-18 DIAGNOSIS — H2511 Age-related nuclear cataract, right eye: Secondary | ICD-10-CM | POA: Diagnosis not present

## 2019-05-18 DIAGNOSIS — H25043 Posterior subcapsular polar age-related cataract, bilateral: Secondary | ICD-10-CM | POA: Diagnosis not present

## 2019-05-18 DIAGNOSIS — H25013 Cortical age-related cataract, bilateral: Secondary | ICD-10-CM | POA: Diagnosis not present

## 2019-05-18 DIAGNOSIS — H2513 Age-related nuclear cataract, bilateral: Secondary | ICD-10-CM | POA: Diagnosis not present

## 2019-05-23 ENCOUNTER — Ambulatory Visit: Payer: Medicare Other | Admitting: Nurse Practitioner

## 2019-05-24 ENCOUNTER — Telehealth: Payer: Self-pay

## 2019-05-24 NOTE — Progress Notes (Signed)
Virtual Visit via Telephone Note   This visit type was conducted due to national recommendations for restrictions regarding the COVID-19 Pandemic (e.g. social distancing) in an effort to limit this patient's exposure and mitigate transmission in our community.  Due to her co-morbid illnesses, this patient is at least at moderate risk for complications without adequate follow up.  This format is felt to be most appropriate for this patient at this time.  The patient did not have access to video technology/had technical difficulties with video requiring transitioning to audio format only (telephone).  All issues noted in this document were discussed and addressed.  No physical exam could be performed with this format.  Please refer to the patient's chart for her  consent to telehealth for [facility].   Date:  05/27/2019   ID:  Laura [name], Laura [name] 24-Jun-1951, MRN [id number]  Patient Location: Work Provider Location: Home  PCP:  [name], MD  Cardiologist:  Kathlyn Sacramento, MD  Electrophysiologist:  None   Evaluation Performed:  Follow-Up Visit  Chief Complaint:  Follow up  History of Present Illness:    Laura [name] is a 68 y.o. female with history of permanent Afib on Eliquis,HTN, obesity, OSA on CPAP, andallergies/asthmaon chronic prednisone with ongoing weaningwho presents for follow up of her Afib.   She was previously followed by Dr. Nehemiah Massed, though has more recently transitioned her care to Dr. Fletcher Anon. She previously underwent Lexiscan Myoview in 10/2016 that showed no evidence of ischemia with an EF of 53%. She had an echo in 10/2016 that showed mildly reduced LV systolic function with an EF of 50%,diffuse hypokinesis,mild LVH, moderately dilated left atriumat 48 mm, moderate mitral and tricuspid regurgitation, and mild pulmonary hypertension. She was started on flecainide with plans for DCCV. However, she did not tolerate flecainide due to leg swelling and SOB. She  was switched to [facility] but could not afford the medication. She was subsequently placed on propafenone but again did not tolerate the medication due to leg swelling and SOB. She ultimately never underwent DCCV.She most recently underwent echo in 04/2017 that showed an EF of 50-55%, mild MR, moderately dilated left atrium at 48 mm, normal RVSF, PASP normal.She has been managed with rate control due to lack of significant symptoms. She was last seen in 03/2018 for follow up and was doing well.  She is seen in telemedicine follow up today and is doing very well. She has not had any chest pain, SOB, palpitations, dizziness, presyncope, or syncope. She have 1 mechanical fall ~ 6 months prior after tripping on her deck. She did not hit her head or suffer LOC. No BRBPR or melena. BP and heart rates have been well controlled. She denies any lower extremity swelling, orthopnea, PND, or early satiety. She has not needed her rescue inhaler.   Labs: 01/2019 - SCr 1.15, K+ 5.0, WBC 12.7, HGB 13.2, PLT 350  The patient does not have symptoms concerning for COVID-19 infection (fever, chills, cough, or new shortness of breath).    Past Medical History:  Diagnosis Date   Asthma    Chronic atrial fibrillation    a. diagnosed in 09/2016; b. failed flecainide and propafenone due to LE swelling and SOB, could not afford Multaq; c. CHADS2VASc => 3 (HTN, age x 1, female); d. on Eliquis   GERD (gastroesophageal reflux disease)    History of stress test    a. Lexiscan Myoview 10/2016: no evidence of ischemia, EF 53%   Hypertension  Obesity    Obstructive sleep apnea    Pulmonary hypertension ([facility])    Systolic dysfunction    a. TTE 10/2016: EF 50%, mild LVH, moderately dilated LA, moderate MR/TR, mild pulmonary hypertension   Past Surgical History:  Procedure Laterality Date   INCISION AND DRAINAGE ABSCESS Right 06/29/2016   Procedure: INCISION AND DRAINAGE ABSCESS;  Surgeon: Florene Glen, MD;   Location: [facility] ORS;  Service: General;  Laterality: Right;   INCISION AND DRAINAGE OF WOUND Left 06/29/2016   Procedure: IRRIGATION AND DEBRIDEMENT WOUND;  Surgeon: Florene Glen, MD;  Location: [facility] ORS;  Service: General;  Laterality: Left;     Current Meds  Medication Sig   albuterol (PROVENTIL HFA;VENTOLIN HFA) 108 (90 Base) MCG/ACT inhaler Inhale 2 puffs into the lungs every 4 (four) hours as needed for wheezing or shortness of breath.   diltiazem (CARDIZEM CD) 360 MG 24 hr capsule TAKE 1 CAPSULE (360 MG TOTAL) BY MOUTH DAILY.   ELIQUIS 5 MG TABS tablet TAKE 1 TABLET (5 MG TOTAL) BY MOUTH 2 (TWO) TIMES DAILY.   EPINEPHrine 0.3 mg/0.3 mL IJ SOAJ injection Inject into the muscle.   furosemide (LASIX) 40 MG tablet Take 1 tablet (40 mg total) by mouth daily.   guaiFENesin (MUCINEX) 600 MG 12 hr tablet Take 600 mg by mouth 2 (two) times daily as needed.   ibuprofen (ADVIL,MOTRIN) 200 MG tablet Take by mouth as needed.    ipratropium-albuterol (DUONEB) 0.5-2.5 (3) MG/3ML SOLN Take 3 mLs by nebulization every 4 (four) hours as needed.   levalbuterol (XOPENEX) 0.31 MG/3ML nebulizer solution Take 3 mLs by nebulization every 4 (four) hours as needed. For wheezing/ and or shortness of breath.   losartan (COZAAR) 25 MG tablet TAKE 1 TABLET BY MOUTH EVERY DAY   montelukast (SINGULAIR) 10 MG tablet TAKE 1 TABLET BY MOUTH EVERY DAY   Multiple Vitamin (MULTIVITAMIN) tablet Take 1 tablet by mouth daily.   nystatin (MYCOSTATIN) 100000 UNIT/ML suspension Take 5 mLs (500,000 Units total) by mouth 4 (four) times daily. (Patient taking differently: Take 5 mLs by mouth 4 (four) times daily as needed. )   ondansetron (ZOFRAN-ODT) 8 MG disintegrating tablet Take 1 tablet (8 mg total) by mouth every 8 (eight) hours as needed for nausea or vomiting.   predniSONE (DELTASONE) 10 MG tablet Take 1 tablet (10 mg total) by mouth daily with breakfast.   Probiotic Product (PROBIOTIC-10) CAPS Take 1 capsule  by mouth daily.    spironolactone (ALDACTONE) 25 MG tablet Take 1 tablet (25 mg total) by mouth 2 (two) times daily.   theophylline (THEO-24) 300 MG 24 hr capsule Take 1 capsule (300 mg total) by mouth daily.   [DISCONTINUED] fluconazole (DIFLUCAN) 150 MG tablet Take 1 tablet po once. May repeat dose in 3 days as needed for persistent symptoms.     Allergies:   Flecainide; Propafenone; and Rivaroxaban   Social History   Tobacco Use   Smoking status: Never Smoker   Smokeless tobacco: Never Used  Substance Use Topics   Alcohol use: No   Drug use: No     Family Hx: The patient's family history includes COPD in her father; Dementia in her mother; Osteoporosis in her mother; Vascular Disease in her mother.  ROS:   Please see the history of present illness.     All other systems reviewed and are negative.   Prior CV studies:   The following studies were reviewed today:  2D Echo 04/2017: - Left ventricle:  The cavity size was normal. Systolic function wasnormal. The estimated ejection fraction was in the range of 50%to 55%. Wall motion was normal; there were no regional wallmotion abnormalities. The study is not technically sufficient toallow evaluation of LV diastolic function. - Mitral valve: There was mild regurgitation. - Left atrium: The atrium was moderately dilated. - Right ventricle: Systolic function was normal. - Pulmonary arteries: Systolic pressure was within the normal range.  Impressions:  - Rhythm is atrial fibrillation.  Labs/Other Tests and Data Reviewed:    EKG:  No ECG reviewed.  Recent Labs: 09/10/2018: ALT 15; TSH 3.930 01/28/2019: BUN 30; Creatinine, Ser 1.15; Hemoglobin 13.2; Platelets 350; Potassium 5.0; Sodium 136   Recent Lipid Panel Lab Results  Component Value Date/Time   CHOL 189 09/10/2018 07:41 AM   TRIG 104 09/10/2018 07:41 AM   HDL 55 09/10/2018 07:41 AM   CHOLHDL 3.4 09/10/2018 07:41 AM   LDLCALC 113 (H) 09/10/2018 07:41 AM     Wt Readings from Last 3 Encounters:  05/27/19 263 lb (119.3 kg)  03/16/19 270 lb (122.5 kg)  02/28/19 267 lb 9.6 oz (121.4 kg)     Objective:    Vital Signs:  Ht 5' 4.5" (1.638 m)    Wt 263 lb (119.3 kg) Comment: 05/26/2019   BMI 44.45 kg/m    VITAL SIGNS:  reviewed  ASSESSMENT & PLAN:    1. Permanent Afib: Ventricular rates have been well controlled. Continue Cardizem and Eliquis. Recent CBC as above.   2. HTN: Readings have been well controlled. No changes.   3. Obesity/OSA on CPAP: Continued weight loss is advised. Continue CPAP.  4. Asthma/allergies: Well controlled.   COVID-19 Education: The signs and symptoms of COVID-19 were discussed with the patient and how to seek care for testing (follow up with PCP or arrange E-visit).  The importance of social distancing was discussed today.  Time:   Today, I have spent 10 minutes with the patient with telehealth technology discussing the above problems.     Medication Adjustments/Labs and Tests Ordered: Current medicines are reviewed at length with the patient today.  Concerns regarding medicines are outlined above.   Tests Ordered: No orders of the defined types were placed in this encounter.   Medication Changes: No orders of the defined types were placed in this encounter.   Disposition:  Follow up in 6 month(s)  Signed, Christell Faith, PA-C  05/27/2019 9:31 AM    [place]

## 2019-05-24 NOTE — Progress Notes (Deleted)
{Choose 1 Note Type (Telehealth Visit or Telephone Visit):[phone]}   Date:  05/24/2019   ID:  Laura [name], DOB [birth year], MRN [id number]  {Patient Location:[phone]::"Home"} {Provider Location:[phone]::"Home"}  PCP:  [name], MD  Cardiologist:  Kathlyn Sacramento, MD  Electrophysiologist:  None   Evaluation Performed:  Follow-Up Visit  Chief Complaint:  Follow up  History of Present Illness:    Laura [name] is a 68 y.o. female with permanent Afib on Eliquis, essential HTN, obesity, OSA on CPAP, and allergies/asthma on chronic prednisone with ongoing weaning who presents for follow up of her Afib.   She was previously followed by Dr. Nehemiah Massed, though has more recently transitioned her care to Dr. Fletcher Anon. She previously underwent Lexiscan Myoview in 10/2016 that showed no evidence of ischemia with an EF of 53%. She had an echo in 10/2016 that showed mildly reduced LV systolic function with an EF of 50%, diffuse hypokinesis, mild LVH, moderately dilated left atrium at 48 mm, moderate mitral and tricuspid regurgitation, and mild pulmonary hypertension. She was started on flecainide with plans for DCCV. However, she did not tolerate flecainide due to leg swelling and SOB. She was switched to [facility] but could not afford the medication. She was subsequently placed on propafenone but again did not tolerate the medication due to leg swelling and SOB. She ultimately never underwent DCCV. She most recently underwent echo in 04/2017 that showed an EF of 50-55%, mild MR, moderately dilated left atrium at 48 mm, normal RVSF, PASP normal. She has been managed with rate control due to lack of significant symptoms. She was last seen in 03/2018 for follow up and was doing well.   ***  Labs: 01/2019 - SCr 1.15, K+ 5.0, WBC 12.7, HGB 13.2, PLT 350  The patient {does/does not:200015} have symptoms concerning for COVID-19 infection (fever, chills, cough, or new shortness of breath).    Past  Medical History:  Diagnosis Date  . Asthma   . Chronic atrial fibrillation    a. diagnosed in 09/2016; b. failed flecainide and propafenone due to LE swelling and SOB, could not afford Multaq; c. CHADS2VASc => 3 (HTN, age x 1, female); d. on Eliquis  . GERD (gastroesophageal reflux disease)   . History of stress test    a. Lexiscan Myoview 10/2016: no evidence of ischemia, EF 53%  . Hypertension   . Obesity   . Obstructive sleep apnea   . Pulmonary hypertension ([place])   . Systolic dysfunction    a. TTE 10/2016: EF 50%, mild LVH, moderately dilated LA, moderate MR/TR, mild pulmonary hypertension   Past Surgical History:  Procedure Laterality Date  . INCISION AND DRAINAGE ABSCESS Right 06/29/2016   Procedure: INCISION AND DRAINAGE ABSCESS;  Surgeon: Florene Glen, MD;  Location: [facility] ORS;  Service: General;  Laterality: Right;  . INCISION AND DRAINAGE OF WOUND Left 06/29/2016   Procedure: IRRIGATION AND DEBRIDEMENT WOUND;  Surgeon: Florene Glen, MD;  Location: [facility] ORS;  Service: General;  Laterality: Left;     No outpatient medications have been marked as taking for the 05/25/19 encounter (Appointment) with Rise Mu, PA-C.     Allergies:   Flecainide; Propafenone; and Rivaroxaban   Social History   Tobacco Use  . Smoking status: Never Smoker  . Smokeless tobacco: Never Used  Substance Use Topics  . Alcohol use: No  . Drug use: No     Family Hx: The patient's family history includes COPD in her father; Dementia in her  mother; Osteoporosis in her mother; Vascular Disease in her mother.  ROS:   Please see the history of present illness.     All other systems reviewed and are negative.   Prior CV studies:   The following studies were reviewed today:  2D Echo 04/2017: - Left ventricle: The cavity size was normal. Systolic function was   normal. The estimated ejection fraction was in the range of 50%   to 55%. Wall motion was normal; there were no regional wall    motion abnormalities. The study is not technically sufficient to   allow evaluation of LV diastolic function. - Mitral valve: There was mild regurgitation. - Left atrium: The atrium was moderately dilated. - Right ventricle: Systolic function was normal. - Pulmonary arteries: Systolic pressure was within the normal   range.  Impressions:  - Rhythm is atrial fibrillation.  Labs/Other Tests and Data Reviewed:    EKG:  {EKG/Telemetry Strips Reviewed:[phone]}  Recent Labs: 09/10/2018: ALT 15; TSH 3.930 01/28/2019: BUN 30; Creatinine, Ser 1.15; Hemoglobin 13.2; Platelets 350; Potassium 5.0; Sodium 136   Recent Lipid Panel Lab Results  Component Value Date/Time   CHOL 189 09/10/2018 07:41 AM   TRIG 104 09/10/2018 07:41 AM   HDL 55 09/10/2018 07:41 AM   CHOLHDL 3.4 09/10/2018 07:41 AM   LDLCALC 113 (H) 09/10/2018 07:41 AM    Wt Readings from Last 3 Encounters:  03/16/19 270 lb (122.5 kg)  02/28/19 267 lb 9.6 oz (121.4 kg)  02/03/19 268 lb (121.6 kg)     Objective:    Vital Signs:  There were no vitals taken for this visit.   {HeartCare Virtual Exam (Optional):[phone]::"VITAL SIGNS:  reviewed"}  ASSESSMENT & PLAN:    1. ***  COVID-19 Education: The signs and symptoms of COVID-19 were discussed with the patient and how to seek care for testing (follow up with PCP or arrange E-visit).  The importance of social distancing was discussed today.  Time:   Today, I have spent *** minutes with the patient with telehealth technology discussing the above problems.     Medication Adjustments/Labs and Tests Ordered: Current medicines are reviewed at length with the patient today.  Concerns regarding medicines are outlined above.   Tests Ordered: No orders of the defined types were placed in this encounter.   Medication Changes: No orders of the defined types were placed in this encounter.   Disposition:  Follow up {follow up:15908}  Signed, Christell Faith, PA-C   05/24/2019 12:28 PM    [facility]

## 2019-05-24 NOTE — Telephone Encounter (Signed)

## 2019-05-25 ENCOUNTER — Other Ambulatory Visit: Payer: Self-pay

## 2019-05-25 ENCOUNTER — Ambulatory Visit (INDEPENDENT_AMBULATORY_CARE_PROVIDER_SITE_OTHER): Payer: BC Managed Care – PPO

## 2019-05-25 ENCOUNTER — Telehealth: Payer: Medicare Other | Admitting: Physician Assistant

## 2019-05-25 DIAGNOSIS — G4733 Obstructive sleep apnea (adult) (pediatric): Secondary | ICD-10-CM | POA: Diagnosis not present

## 2019-05-25 NOTE — Progress Notes (Signed)
95 percentile pressure 14   95th percentile leak 26.7   apnea index 0.1 /hr  apnea-hypopnea index  0.5 /hr   total days used  >4 hr 89 days  total days used <4 hr 1 days  Total compliance 99 percent  She is doing great no problems or questions

## 2019-05-27 ENCOUNTER — Encounter: Payer: Self-pay | Admitting: Physician Assistant

## 2019-05-27 ENCOUNTER — Telehealth (INDEPENDENT_AMBULATORY_CARE_PROVIDER_SITE_OTHER): Payer: BC Managed Care – PPO | Admitting: Physician Assistant

## 2019-05-27 ENCOUNTER — Other Ambulatory Visit: Payer: Self-pay

## 2019-05-27 VITALS — Ht 64.5 in | Wt 263.0 lb

## 2019-05-27 DIAGNOSIS — I482 Chronic atrial fibrillation, unspecified: Secondary | ICD-10-CM | POA: Diagnosis not present

## 2019-05-27 DIAGNOSIS — G4733 Obstructive sleep apnea (adult) (pediatric): Secondary | ICD-10-CM

## 2019-05-27 DIAGNOSIS — I10 Essential (primary) hypertension: Secondary | ICD-10-CM

## 2019-05-27 NOTE — Patient Instructions (Signed)
It was a pleasure to speak with you on the phone today! Thank you for allowing [place] to continue taking care of your [facility] needs during this time.   Feel free to call as needed for questions and concerns related to your cardiac needs.   Medication Instructions:  Your physician recommends that you continue on your current medications as directed. Please refer to the Current Medication list given to you today.  If you need a refill on your cardiac medications before your next appointment, please call your pharmacy.   Lab work: None ordered If you have labs (blood work) drawn today and your tests are completely normal, you will receive your results only by: Marland Kitchen MyChart Message (if you have MyChart) OR . A paper copy in the mail If you have any lab test that is abnormal or we need to change your treatment, we will call you to review the results.  Testing/Procedures: 1- Echo  Please return to [facility] on __schedule for 12/2020____________ at _______________ AM/PM for an Echocardiogram. Your physician has requested that you have an echocardiogram. Echocardiography is a painless test that uses sound waves to create images of your heart. It provides your doctor with information about the size and shape of your heart and how well your heart's chambers and valves are working. This procedure takes approximately one hour. There are no restrictions for this procedure. Please note; depending on visual quality an IV may need to be placed.    Follow-Up: At [facility], you and your health needs are our priority.  As part of our continuing mission to provide you with exceptional heart care, we have created designated Provider Care Teams.  These Care Teams include your primary Cardiologist (physician) and Advanced Practice Providers (APPs -  Physician Assistants and Nurse Practitioners) who all work together to provide you with the care you need, when you need it. You will need a follow up  appointment in 6 months.  Please call our office 2 months in advance to schedule this appointment.  You may see Kathlyn Sacramento, MD or Christell Faith, PA-C.

## 2019-06-06 ENCOUNTER — Ambulatory Visit (INDEPENDENT_AMBULATORY_CARE_PROVIDER_SITE_OTHER): Payer: BC Managed Care – PPO | Admitting: Nurse Practitioner

## 2019-06-06 ENCOUNTER — Other Ambulatory Visit: Payer: Self-pay

## 2019-06-06 ENCOUNTER — Encounter: Payer: Self-pay | Admitting: Nurse Practitioner

## 2019-06-06 VITALS — BP 140/70 | HR 67 | Resp 16 | Ht 64.5 in | Wt 273.6 lb

## 2019-06-06 DIAGNOSIS — Z6841 Body Mass Index (BMI) 40.0 and over, adult: Secondary | ICD-10-CM | POA: Diagnosis not present

## 2019-06-06 DIAGNOSIS — I8311 Varicose veins of right lower extremity with inflammation: Secondary | ICD-10-CM

## 2019-06-06 DIAGNOSIS — M79604 Pain in right leg: Secondary | ICD-10-CM | POA: Diagnosis not present

## 2019-06-06 DIAGNOSIS — K2100 Gastro-esophageal reflux disease with esophagitis, without bleeding: Secondary | ICD-10-CM

## 2019-06-06 MED ORDER — PHENTERMINE HCL 37.5 MG PO TABS: 37.5000 mg | ORAL_TABLET | Freq: Every day | ORAL | 1 refills | Status: DC

## 2019-06-06 MED ORDER — PANTOPRAZOLE SODIUM 40 MG PO TBEC: 40.0000 mg | DELAYED_RELEASE_TABLET | Freq: Every day | ORAL | 3 refills | Status: DC

## 2019-06-06 NOTE — Progress Notes (Signed)
Pt blood pressure elevated, informed provider. 

## 2019-06-06 NOTE — Progress Notes (Signed)
[facility] [place], [place] [address]  Internal MEDICINE  Office Visit Note  Patient Name: Laura [name]  [id number]  [id number]  Date of Service: 06/08/2019  Chief Complaint  Patient presents with  . Medical Management of Chronic Issues    4wk follow up, pt concerned about weight gain  . Cellulitis    The patient is here for routine follow up. She is having a great deal of right leg pain. Swollen and has like a lump at the base of the right lower leg. Was treated multiple times with antibiotics for cellulitis which appears to have resolved. Has significant varicose veins of the right lower leg. Has area of numbness on anterior aspect of her knee, and pain goes all the way up to the right hip.       Current Medication: Outpatient Encounter Medications as of 06/06/2019  Medication Sig  . albuterol (PROVENTIL HFA;VENTOLIN HFA) 108 (90 Base) MCG/ACT inhaler Inhale 2 puffs into the lungs every 4 (four) hours as needed for wheezing or shortness of breath.  . ALPRAZolam (XANAX) 0.25 MG tablet Take 1 tablet (0.25 mg total) by mouth 2 (two) times daily. (Patient not taking: Reported on 05/27/2019)  . diltiazem (CARDIZEM CD) 360 MG 24 hr capsule TAKE 1 CAPSULE (360 MG TOTAL) BY MOUTH DAILY.  Marland Kitchen ELIQUIS 5 MG TABS tablet TAKE 1 TABLET (5 MG TOTAL) BY MOUTH 2 (TWO) TIMES DAILY.  Marland Kitchen EPINEPHrine 0.3 mg/0.3 mL IJ SOAJ injection Inject into the muscle.  . furosemide (LASIX) 40 MG tablet Take 1 tablet (40 mg total) by mouth daily.  Marland Kitchen guaiFENesin (MUCINEX) 600 MG 12 hr tablet Take 600 mg by mouth 2 (two) times daily as needed.  Marland Kitchen ibuprofen (ADVIL,MOTRIN) 200 MG tablet Take by mouth as needed.   Marland Kitchen ipratropium-albuterol (DUONEB) 0.5-2.5 (3) MG/3ML SOLN Take 3 mLs by nebulization every 4 (four) hours as needed.  . levalbuterol (XOPENEX) 0.31 MG/3ML nebulizer solution Take 3 mLs by nebulization every 4 (four) hours as needed. For wheezing/ and or shortness of breath.  . losartan  (COZAAR) 25 MG tablet TAKE 1 TABLET BY MOUTH EVERY DAY  . montelukast (SINGULAIR) 10 MG tablet TAKE 1 TABLET BY MOUTH EVERY DAY  . Multiple Vitamin (MULTIVITAMIN) tablet Take 1 tablet by mouth daily.  Marland Kitchen nystatin (MYCOSTATIN) 100000 UNIT/ML suspension Take 5 mLs (500,000 Units total) by mouth 4 (four) times daily. (Patient taking differently: Take 5 mLs by mouth 4 (four) times daily as needed. )  . ondansetron (ZOFRAN-ODT) 8 MG disintegrating tablet Take 1 tablet (8 mg total) by mouth every 8 (eight) hours as needed for nausea or vomiting.  . pantoprazole (PROTONIX) 40 MG tablet Take 1 tablet (40 mg total) by mouth daily.  . phentermine (ADIPEX-P) 37.5 MG tablet Take 1 tablet (37.5 mg total) by mouth daily before breakfast.  . predniSONE (DELTASONE) 10 MG tablet Take 1 tablet (10 mg total) by mouth daily with breakfast.  . Probiotic Product (PROBIOTIC-10) CAPS Take 1 capsule by mouth daily.   Marland Kitchen spironolactone (ALDACTONE) 25 MG tablet Take 1 tablet (25 mg total) by mouth 2 (two) times daily.  . theophylline (THEO-24) 300 MG 24 hr capsule Take 1 capsule (300 mg total) by mouth daily.   No facility-administered encounter medications on file as of 06/06/2019.     Surgical History: Past Surgical History:  Procedure Laterality Date  . INCISION AND DRAINAGE ABSCESS Right 06/29/2016   Procedure: INCISION AND DRAINAGE ABSCESS;  Surgeon: Jerrol Banana  Burt Knack, MD;  Location: [facility] ORS;  Service: General;  Laterality: Right;  . INCISION AND DRAINAGE OF WOUND Left 06/29/2016   Procedure: IRRIGATION AND DEBRIDEMENT WOUND;  Surgeon: Florene Glen, MD;  Location: [facility] ORS;  Service: General;  Laterality: Left;    Medical History: Past Medical History:  Diagnosis Date  . Asthma   . Chronic atrial fibrillation    a. diagnosed in 09/2016; b. failed flecainide and propafenone due to LE swelling and SOB, could not afford Multaq; c. CHADS2VASc => 3 (HTN, age x 1, female); d. on Eliquis  . GERD (gastroesophageal  reflux disease)   . History of stress test    a. Lexiscan Myoview 10/2016: no evidence of ischemia, EF 53%  . Hypertension   . Obesity   . Obstructive sleep apnea   . Pulmonary hypertension ([place])   . Systolic dysfunction    a. TTE 10/2016: EF 50%, mild LVH, moderately dilated LA, moderate MR/TR, mild pulmonary hypertension    Family History: Family History  Problem Relation Age of Onset  . Dementia Mother   . Osteoporosis Mother   . Vascular Disease Mother   . COPD Father     Social History   Socioeconomic History  . Marital status: Married    Spouse name: Not on file  . Number of children: Not on file  . Years of education: Not on file  . Highest education level: Not on file  Occupational History  . Not on file  Social Needs  . Financial resource strain: Not on file  . Food insecurity    Worry: Not on file    Inability: Not on file  . Transportation needs    Medical: Not on file    Non-medical: Not on file  Tobacco Use  . Smoking status: Never Smoker  . Smokeless tobacco: Never Used  Substance and Sexual Activity  . Alcohol use: No  . Drug use: No  . Sexual activity: Not on file  Lifestyle  . Physical activity    Days per week: Not on file    Minutes per session: Not on file  . Stress: Not on file  Relationships  . Social Herbalist on phone: Not on file    Gets together: Not on file    Attends religious service: Not on file    Active member of club or organization: Not on file    Attends meetings of clubs or organizations: Not on file    Relationship status: Not on file  . Intimate partner violence    Fear of current or ex partner: Not on file    Emotionally abused: Not on file    Physically abused: Not on file    Forced sexual activity: Not on file  Other Topics Concern  . Not on file  Social History Narrative  . Not on file      Review of Systems  Constitutional: Positive for fatigue. Negative for activity change, chills and  unexpected weight change.       Four pound weight gain since most recent visit  HENT: Negative for congestion, postnasal drip, rhinorrhea, sneezing and sore throat.   Respiratory: Positive for shortness of breath and wheezing. Negative for cough and chest tightness.   Cardiovascular: Positive for leg swelling. Negative for chest pain and palpitations.  Gastrointestinal: Negative for nausea.  Endocrine: Negative for cold intolerance, heat intolerance, polydipsia and polyuria.  Musculoskeletal: Positive for arthralgias and joint swelling. Negative for back pain and  neck pain.       Swelling and tenderness of right ankle and right lower leg. Tender at the end of the day.   Skin: Negative for rash.  Allergic/Immunologic: Positive for environmental allergies.  Neurological: Positive for headaches. Negative for dizziness, tremors and numbness.  Hematological: Negative for adenopathy. Does not bruise/bleed easily.  Psychiatric/Behavioral: Behavioral problem: Depression. The patient is nervous/anxious.     Today's Vitals   06/06/19 1604  BP: 140/70  Pulse: 67  Resp: 16  SpO2: 96%  Weight: 273 lb 9.6 oz (124.1 kg)  Height: 5' 4.5" (1.638 m)   Body mass index is 46.24 kg/m.  Physical Exam Vitals signs and nursing note reviewed.  Constitutional:      General: She is not in acute distress.    Appearance: She is well-developed. She is obese. She is not diaphoretic.  HENT:     Head: Normocephalic and atraumatic.     Mouth/Throat:     Pharynx: No oropharyngeal exudate.  Eyes:     Pupils: Pupils are equal, round, and reactive to light.  Neck:     Musculoskeletal: Normal range of motion and neck supple.     Thyroid: No thyromegaly.     Vascular: No carotid bruit or JVD.     Trachea: No tracheal deviation.  Cardiovascular:     Rate and Rhythm: Normal rate. Rhythm irregular.     Heart sounds: Murmur present. No friction rub. No gallop.      Comments: Moderate varicose veins,  bilaterally. Pulmonary:     Effort: Pulmonary effort is normal. No respiratory distress.     Breath sounds: Normal breath sounds. No wheezing or rales.  Chest:     Chest wall: No tenderness.  Musculoskeletal: Normal range of motion.     Comments: Moderate swelling at the posterior aspect of right lower extremity. Tender when palpated. Increases when on her feet for long periods of time.   Lymphadenopathy:     Cervical: No cervical adenopathy.  Skin:    General: Skin is warm and dry.  Neurological:     Mental Status: She is alert and oriented to person, place, and time.     Cranial Nerves: No cranial nerve deficit.  Psychiatric:        Behavior: Behavior normal.        Thought Content: Thought content normal.        Judgment: Judgment normal.   Assessment/Plan: 1. Pain in right leg Will get ultrasound of right lower extremity for evaluation of venous reflux or venous stasis. - US Venous Img Lower Unilateral Right; Future  2. Varicose veins of right lower extremity with inflammation Will get ultrasound of right lower extremity for evaluation of venous reflux or venous stasis. - US Venous Img Lower Unilateral Right; Future  3. Gastroesophageal reflux disease with esophagitis - pantoprazole (PROTONIX) 40 MG tablet; Take 1 tablet (40 mg total) by mouth daily.  Dispense: 30 tablet; Refill: 3  4. Body mass index (BMI) 45.0-49.9, adult (HCC) Restart phentermine 37.5mg  tablets. Take daily. Limit calorie intake to 1200-1500 calories per day. incorprate exercise into daily routine.  - phentermine (ADIPEX-P) 37.5 MG tablet; Take 1 tablet (37.5 mg total) by mouth daily before breakfast.  Dispense: 30 tablet; Refill: 1  General Counseling: Ronella verbalizes understanding of the findings of todays visit and agrees with plan of treatment. I have discussed any further diagnostic evaluation that may be needed or ordered today. We also reviewed her medications today. she has been  encouraged to call  the office with any questions or concerns that should arise related to todays visit.   There is a liability release in patients' chart. There has been a 10 minute discussion about the side effects including but not limited to elevated blood pressure, anxiety, lack of sleep and dry mouth. Pt understands and will like to start/continue on appetite suppressant at this time. There will be one month RX given at the time of visit with proper follow up. Nova diet plan with restricted calories is given to the pt. Pt understands and agrees with  plan of treatment  This patient was seen by Leretha Pol FNP Collaboration with Dr [name] as a part of collaborative care agreement    Orders Placed This Encounter  Procedures  . US Venous Img Lower Unilateral Right    Meds ordered this encounter  Medications  . phentermine (ADIPEX-P) 37.5 MG tablet    Sig: Take 1 tablet (37.5 mg total) by mouth daily before breakfast.    Dispense:  30 tablet    Refill:  1    Order Specific Question:   Supervising Provider    Answer:   [name] [place]  . pantoprazole (PROTONIX) 40 MG tablet    Sig: Take 1 tablet (40 mg total) by mouth daily.    Dispense:  30 tablet    Refill:  3    Order Specific Question:   Supervising Provider    Answer:   [name] [[id number]]    Time spent: 69 Minutes      Dr [name] Internal medicine

## 2019-06-08 DIAGNOSIS — I8311 Varicose veins of right lower extremity with inflammation: Secondary | ICD-10-CM | POA: Insufficient documentation

## 2019-06-16 ENCOUNTER — Other Ambulatory Visit: Payer: Self-pay

## 2019-06-16 MED ORDER — APIXABAN 5 MG PO TABS: 5.0000 mg | ORAL_TABLET | Freq: Two times a day (BID) | ORAL | 5 refills | Status: DC

## 2019-06-16 NOTE — Telephone Encounter (Signed)
Eliquis 5mg  refill request received; pt is 68 yrs old, wt-124.1kg, Crea-1.15 on 01/28/2019, last telemedicine visit by Christell Faith on 05/27/2019; will send in refill to requested pharmacy.

## 2019-06-28 ENCOUNTER — Ambulatory Visit (INDEPENDENT_AMBULATORY_CARE_PROVIDER_SITE_OTHER): Payer: BC Managed Care – PPO | Admitting: Internal Medicine

## 2019-06-28 ENCOUNTER — Other Ambulatory Visit: Payer: Self-pay

## 2019-06-28 ENCOUNTER — Encounter: Payer: Self-pay | Admitting: Internal Medicine

## 2019-06-28 VITALS — BP 134/79 | HR 72 | Resp 16 | Ht 64.5 in | Wt 281.0 lb

## 2019-06-28 DIAGNOSIS — G4733 Obstructive sleep apnea (adult) (pediatric): Secondary | ICD-10-CM

## 2019-06-28 DIAGNOSIS — R0602 Shortness of breath: Secondary | ICD-10-CM | POA: Diagnosis not present

## 2019-06-28 DIAGNOSIS — K2100 Gastro-esophageal reflux disease with esophagitis, without bleeding: Secondary | ICD-10-CM

## 2019-06-28 DIAGNOSIS — Z9989 Dependence on other enabling machines and devices: Secondary | ICD-10-CM | POA: Diagnosis not present

## 2019-06-28 DIAGNOSIS — J4550 Severe persistent asthma, uncomplicated: Secondary | ICD-10-CM | POA: Diagnosis not present

## 2019-06-28 NOTE — Progress Notes (Signed)
[facility] [place], [place] [address]  Pulmonary Sleep Medicine   Office Visit Note  Patient Name: Laura [name] DOB: 06-Sep-1951 MRN [id number]  Date of Service: 06/28/2019  Complaints/HPI: PT is here for follow up.  Pt reports she is wearing her cpap nightly.  She reports good relief of symptoms.  She reports there is oxygen bleeding into her cpap currently at 2lpm.  She is cleaning her machine with a so clean machine.  She is changing her tubing and seal/filter as directed. She denies any current issues.     ROS  General: (-) fever, (-) chills, (-) night sweats, (-) weakness Skin: (-) rashes, (-) itching,. Eyes: (-) visual changes, (-) redness, (-) itching. Nose and Sinuses: (-) nasal stuffiness or itchiness, (-) postnasal drip, (-) nosebleeds, (-) sinus trouble. Mouth and Throat: (-) sore throat, (-) hoarseness. Neck: (-) swollen glands, (-) enlarged thyroid, (-) neck pain. Respiratory: - cough, (-) bloody sputum, - shortness of breath, - wheezing. Cardiovascular: - ankle swelling, (-) chest pain. Lymphatic: (-) lymph node enlargement. Neurologic: (-) numbness, (-) tingling. Psychiatric: (-) anxiety, (-) depression   Current Medication: Outpatient Encounter Medications as of 06/28/2019  Medication Sig  . albuterol (PROVENTIL HFA;VENTOLIN HFA) 108 (90 Base) MCG/ACT inhaler Inhale 2 puffs into the lungs every 4 (four) hours as needed for wheezing or shortness of breath.  . ALPRAZolam (XANAX) 0.25 MG tablet Take 1 tablet (0.25 mg total) by mouth 2 (two) times daily.  Marland Kitchen apixaban (ELIQUIS) 5 MG TABS tablet Take 1 tablet (5 mg total) by mouth 2 (two) times daily.  Marland Kitchen diltiazem (CARDIZEM CD) 360 MG 24 hr capsule TAKE 1 CAPSULE (360 MG TOTAL) BY MOUTH DAILY.  Marland Kitchen EPINEPHrine 0.3 mg/0.3 mL IJ SOAJ injection Inject into the muscle.  . furosemide (LASIX) 40 MG tablet Take 1 tablet (40 mg total) by mouth daily.  Marland Kitchen guaiFENesin (MUCINEX) 600 MG 12 hr tablet Take 600  mg by mouth 2 (two) times daily as needed.  Marland Kitchen ibuprofen (ADVIL,MOTRIN) 200 MG tablet Take by mouth as needed.   Marland Kitchen ipratropium-albuterol (DUONEB) 0.5-2.5 (3) MG/3ML SOLN Take 3 mLs by nebulization every 4 (four) hours as needed.  . levalbuterol (XOPENEX) 0.31 MG/3ML nebulizer solution Take 3 mLs by nebulization every 4 (four) hours as needed. For wheezing/ and or shortness of breath.  . losartan (COZAAR) 25 MG tablet TAKE 1 TABLET BY MOUTH EVERY DAY  . montelukast (SINGULAIR) 10 MG tablet TAKE 1 TABLET BY MOUTH EVERY DAY  . Multiple Vitamin (MULTIVITAMIN) tablet Take 1 tablet by mouth daily.  . NON FORMULARY cpap with oxygen  . nystatin (MYCOSTATIN) 100000 UNIT/ML suspension Take 5 mLs (500,000 Units total) by mouth 4 (four) times daily. (Patient taking differently: Take 5 mLs by mouth 4 (four) times daily as needed. )  . ondansetron (ZOFRAN-ODT) 8 MG disintegrating tablet Take 1 tablet (8 mg total) by mouth every 8 (eight) hours as needed for nausea or vomiting.  . pantoprazole (PROTONIX) 40 MG tablet Take 1 tablet (40 mg total) by mouth daily.  . phentermine (ADIPEX-P) 37.5 MG tablet Take 1 tablet (37.5 mg total) by mouth daily before breakfast.  . predniSONE (DELTASONE) 10 MG tablet Take 1 tablet (10 mg total) by mouth daily with breakfast.  . Probiotic Product (PROBIOTIC-10) CAPS Take 1 capsule by mouth daily.   Marland Kitchen spironolactone (ALDACTONE) 25 MG tablet Take 1 tablet (25 mg total) by mouth 2 (two) times daily.  . theophylline (THEO-24) 300 MG 24  hr capsule Take 1 capsule (300 mg total) by mouth daily.   No facility-administered encounter medications on file as of 06/28/2019.     Surgical History: Past Surgical History:  Procedure Laterality Date  . INCISION AND DRAINAGE ABSCESS Right 06/29/2016   Procedure: INCISION AND DRAINAGE ABSCESS;  Surgeon: Florene Glen, MD;  Location: [facility] ORS;  Service: General;  Laterality: Right;  . INCISION AND DRAINAGE OF WOUND Left 06/29/2016   Procedure:  IRRIGATION AND DEBRIDEMENT WOUND;  Surgeon: Florene Glen, MD;  Location: [facility] ORS;  Service: General;  Laterality: Left;    Medical History: Past Medical History:  Diagnosis Date  . Asthma   . Chronic atrial fibrillation    a. diagnosed in 09/2016; b. failed flecainide and propafenone due to LE swelling and SOB, could not afford Multaq; c. CHADS2VASc => 3 (HTN, age x 1, female); d. on Eliquis  . GERD (gastroesophageal reflux disease)   . History of stress test    a. Lexiscan Myoview 10/2016: no evidence of ischemia, EF 53%  . Hypertension   . Obesity   . Obstructive sleep apnea   . Pulmonary hypertension ([place])   . Systolic dysfunction    a. TTE 10/2016: EF 50%, mild LVH, moderately dilated LA, moderate MR/TR, mild pulmonary hypertension    Family History: Family History  Problem Relation Age of Onset  . Dementia Mother   . Osteoporosis Mother   . Vascular Disease Mother   . COPD Father     Social History: Social History   Socioeconomic History  . Marital status: Married    Spouse name: Not on file  . Number of children: Not on file  . Years of education: Not on file  . Highest education level: Not on file  Occupational History  . Not on file  Social Needs  . Financial resource strain: Not on file  . Food insecurity    Worry: Not on file    Inability: Not on file  . Transportation needs    Medical: Not on file    Non-medical: Not on file  Tobacco Use  . Smoking status: Never Smoker  . Smokeless tobacco: Never Used  Substance and Sexual Activity  . Alcohol use: No  . Drug use: No  . Sexual activity: Not on file  Lifestyle  . Physical activity    Days per week: Not on file    Minutes per session: Not on file  . Stress: Not on file  Relationships  . Social Herbalist on phone: Not on file    Gets together: Not on file    Attends religious service: Not on file    Active member of club or organization: Not on file    Attends meetings of clubs  or organizations: Not on file    Relationship status: Not on file  . Intimate partner violence    Fear of current or ex partner: Not on file    Emotionally abused: Not on file    Physically abused: Not on file    Forced sexual activity: Not on file  Other Topics Concern  . Not on file  Social History Narrative  . Not on file    Vital Signs: Blood pressure 134/79, pulse 72, resp. rate 16, height 5' 4.5" (1.638 m), weight 281 lb (127.5 kg), SpO2 96 %.  Examination: General Appearance: The patient is well-developed, well-nourished, and in no distress. Skin: Gross inspection of skin unremarkable. Head: normocephalic, no gross deformities.  Eyes: no gross deformities noted. ENT: ears appear grossly normal no exudates. Neck: Supple. No thyromegaly. No LAD. Respiratory: clear bilateraly. Cardiovascular: Normal S1 and S2 without murmur or rub. Extremities: No cyanosis. pulses are equal. Neurologic: Alert and oriented. No involuntary movements.  LABS: No results found for this or any previous visit (from the past 2160 hour(s)).  Radiology: US Venous Img Lower Unilateral Right  Result Date: 10/27/2016 CLINICAL DATA:  Pain and swelling x1 week EXAM: RIGHT LOWER EXTREMITY VENOUS DOPPLER ULTRASOUND TECHNIQUE: Gray-scale sonography with compression, as well as color and duplex ultrasound, were performed to evaluate the deep venous system from the level of the common femoral vein through the popliteal and proximal calf veins. COMPARISON:  None FINDINGS: Normal compressibility of the common femoral, superficial femoral, and popliteal veins, as well as the proximal calf veins. No filling defects to suggest DVT on grayscale or color Doppler imaging. Doppler waveforms show normal direction of venous flow, normal respiratory phasicity and response to augmentation. Noncompressible thrombosed superficial and subcutaneous varicose veins in the proximal posterior calf. Survey views of the contralateral  common femoral vein are unremarkable. IMPRESSION: 1. No evidence of  lower extremity deep vein thrombosis, right. 2. Proximal calf superficial thrombophlebitis. Electronically Signed   By: Lucrezia Europe M.D.   On: 10/27/2016 14:54    No results found.  No results found.    Assessment and Plan: Patient Active Problem List   Diagnosis Date Noted  . Varicose veins of right lower extremity with inflammation 06/08/2019  . Gastroesophageal reflux disease with esophagitis 05/08/2019  . Fall at home, initial encounter 03/16/2019  . Contusion of right lower leg 03/16/2019  . Flu-like symptoms 02/03/2019  . Nausea 02/03/2019  . Suprapatellar bursitis of right knee 01/23/2019  . Pain in right leg 01/23/2019  . Cellulitis of right leg 01/23/2019  . Chronic venous stasis dermatitis of right lower extremity 01/23/2019  . Oropharyngeal candidiasis 01/23/2019  . Cellulitis of head except face 12/23/2018  . Screening for breast cancer 08/15/2018  . Chronic bronchitis with acute exacerbation ([place]) 08/15/2018  . Vitamin D deficiency 08/15/2018  . Need for vaccination against Streptococcus pneumoniae using pneumococcal conjugate vaccine 13 08/15/2018  . Obstructive chronic bronchitis without exacerbation ([place]) 07/14/2018  . Body mass index (BMI) 45.0-49.9, adult ([place]) 03/17/2018  . Asthma 02/25/2018  . Generalized anxiety disorder 12/16/2017  . Essential (primary) hypertension 12/16/2017  . Chronic respiratory failure with hypoxia ([place]) 12/16/2017  . Long term (current) use of anticoagulants 12/16/2017  . Chronic atrial fibrillation 12/16/2017  . Other specified functional intestinal disorders 12/16/2017  . Gastro-esophageal reflux disease without esophagitis 12/16/2017  . Obstructive sleep apnea of adult 12/16/2017  . Allergic rhinitis due to animal (cat) (dog) hair and dander 12/16/2017  . Allergic rhinitis due to pollen 12/16/2017  . Severe persistent asthma without complication 00/93/8182  .  Cough 12/16/2017  . Shortness of breath 12/16/2017  . Superficial thrombophlebitis of right leg 11/17/2016  . Pain in limb 11/17/2016  . Chronic venous insufficiency 11/17/2016  . Swelling of limb 11/17/2016  . Cellulitis of buttock   . Cellulitis of right axilla   . Sepsis ([place]) 06/28/2016  . Cellulitis and abscess 06/28/2016  . Hypokalemia 06/28/2016  . Acute renal insufficiency 06/28/2016  . Hyperglycemia 06/28/2016  . Leukocytosis 06/28/2016  . Hypoxia 06/28/2016  . Collagenous colitis 05/13/2016   1. OSA on CPAP Continue cpap therapy as directed.   2. Severe persistent asthma without complication Using proair as needed.  Pt  reports good control of symptoms.  Continue using inhaler as discussed.   3. Gastroesophageal reflux disease with esophagitis Stable, controlled with pantoprazole currently.   4. Morbid obesity ([place]) Obesity Counseling: Risk Assessment: An assessment of behavioral risk factors was made today and includes lack of exercise sedentary lifestyle, lack of portion control and poor dietary habits.  Risk Modification Advice: She was counseled on portion control guidelines. Restricting daily caloric intake to. . The detrimental long term effects of obesity on her health and ongoing poor compliance was also discussed with the patient.  5. SOB (shortness of breath) FEV1 is 1.7 which is 70% or pre-predicted value.  No spiro to compare it to.  Will get PFT, as her last one was one year ago.  - Spirometry with Graph   General Counseling: I have discussed the findings of the evaluation and examination with [name].  I have also discussed any further diagnostic evaluation thatmay be needed or ordered today. Zora verbalizes understanding of the findings of todays visit. We also reviewed her medications today and discussed drug interactions and side effects including but not limited excessive drowsiness and altered mental states. We also discussed that there is always a  risk not just to her but also people around her. she has been encouraged to call the office with any questions or concerns that should arise related to todays visit.    Time spent: 20 This patient was seen by Orson Gear AGNP-C in Collaboration with Dr. Devona Konig as a part of collaborative care agreement.   I have personally obtained a history, examined the patient, evaluated laboratory and imaging results, formulated the assessment and plan and placed orders.    Allyne Gee, MD [facility] Pulmonary and Critical Care Sleep medicine

## 2019-07-01 ENCOUNTER — Ambulatory Visit (INDEPENDENT_AMBULATORY_CARE_PROVIDER_SITE_OTHER): Payer: BC Managed Care – PPO

## 2019-07-01 ENCOUNTER — Encounter (INDEPENDENT_AMBULATORY_CARE_PROVIDER_SITE_OTHER): Payer: Self-pay

## 2019-07-01 ENCOUNTER — Other Ambulatory Visit: Payer: Self-pay

## 2019-07-01 DIAGNOSIS — M79604 Pain in right leg: Secondary | ICD-10-CM

## 2019-07-01 DIAGNOSIS — I8311 Varicose veins of right lower extremity with inflammation: Secondary | ICD-10-CM | POA: Diagnosis not present

## 2019-07-04 ENCOUNTER — Other Ambulatory Visit: Payer: Self-pay | Admitting: *Deleted

## 2019-07-04 DIAGNOSIS — I482 Chronic atrial fibrillation, unspecified: Secondary | ICD-10-CM

## 2019-07-05 DIAGNOSIS — H25011 Cortical age-related cataract, right eye: Secondary | ICD-10-CM | POA: Diagnosis not present

## 2019-07-05 DIAGNOSIS — H25811 Combined forms of age-related cataract, right eye: Secondary | ICD-10-CM | POA: Diagnosis not present

## 2019-07-05 DIAGNOSIS — H2511 Age-related nuclear cataract, right eye: Secondary | ICD-10-CM | POA: Diagnosis not present

## 2019-07-06 DIAGNOSIS — H25042 Posterior subcapsular polar age-related cataract, left eye: Secondary | ICD-10-CM | POA: Diagnosis not present

## 2019-07-06 DIAGNOSIS — H2512 Age-related nuclear cataract, left eye: Secondary | ICD-10-CM | POA: Diagnosis not present

## 2019-07-06 DIAGNOSIS — H25012 Cortical age-related cataract, left eye: Secondary | ICD-10-CM | POA: Diagnosis not present

## 2019-07-07 ENCOUNTER — Other Ambulatory Visit: Payer: Self-pay

## 2019-07-07 ENCOUNTER — Encounter: Payer: Self-pay | Admitting: Nurse Practitioner

## 2019-07-07 ENCOUNTER — Ambulatory Visit (INDEPENDENT_AMBULATORY_CARE_PROVIDER_SITE_OTHER): Payer: BC Managed Care – PPO | Admitting: Nurse Practitioner

## 2019-07-07 VITALS — BP 124/70 | HR 85 | Resp 16 | Ht 64.5 in | Wt 273.4 lb

## 2019-07-07 DIAGNOSIS — R609 Edema, unspecified: Secondary | ICD-10-CM | POA: Diagnosis not present

## 2019-07-07 DIAGNOSIS — Z6841 Body Mass Index (BMI) 40.0 and over, adult: Secondary | ICD-10-CM

## 2019-07-07 DIAGNOSIS — J449 Chronic obstructive pulmonary disease, unspecified: Secondary | ICD-10-CM

## 2019-07-07 DIAGNOSIS — F411 Generalized anxiety disorder: Secondary | ICD-10-CM | POA: Diagnosis not present

## 2019-07-07 DIAGNOSIS — R0602 Shortness of breath: Secondary | ICD-10-CM | POA: Diagnosis not present

## 2019-07-07 DIAGNOSIS — I6523 Occlusion and stenosis of bilateral carotid arteries: Secondary | ICD-10-CM

## 2019-07-07 MED ORDER — ALPRAZOLAM 0.25 MG PO TABS: 0.2500 mg | ORAL_TABLET | Freq: Two times a day (BID) | ORAL | 3 refills | Status: DC

## 2019-07-07 MED ORDER — PHENTERMINE HCL 37.5 MG PO TABS: 37.5000 mg | ORAL_TABLET | Freq: Every day | ORAL | 1 refills | Status: DC

## 2019-07-07 MED ORDER — ALBUTEROL SULFATE HFA 108 (90 BASE) MCG/ACT IN AERS: 2.0000 | INHALATION_SPRAY | RESPIRATORY_TRACT | 3 refills | Status: DC | PRN

## 2019-07-07 NOTE — Progress Notes (Signed)
[facility] [place], [place] [address]  Internal MEDICINE  Office Visit Note  Patient Name: Laura [name]  [id number]  [id number]  Date of Service: 07/17/2019  Chief Complaint  Patient presents with  . Medical Management of Chronic Issues    4wk follow up weight management  . Labs Only    review ultrasound    The patient is here for follow up visit. She is complaining of shortness of breath with exertion. She continues to have a great deal of right leg pain. Swollen and has like a lump at the base of the right lower leg. Was treated multiple times with antibiotics for cellulitis which appears to have resolved. Has significant varicose veins of the right lower leg. Has area of numbness on anterior aspect of her knee, and pain goes all the way up to the right hip. She had ultrasound of the right lower leg which shows evidence of venous reflux and soft tissues swelling in the lower part of the right leg. There was no evidence of blood clot or occlusion. She is scheduled to have an echocardiogram next week with her cardiologist.        Current Medication: Outpatient Encounter Medications as of 07/07/2019  Medication Sig  . albuterol (VENTOLIN HFA) 108 (90 Base) MCG/ACT inhaler Inhale 2 puffs into the lungs every 4 (four) hours as needed for wheezing or shortness of breath.  . ALPRAZolam (XANAX) 0.25 MG tablet Take 1 tablet (0.25 mg total) by mouth 2 (two) times daily.  Marland Kitchen apixaban (ELIQUIS) 5 MG TABS tablet Take 1 tablet (5 mg total) by mouth 2 (two) times daily.  Marland Kitchen diltiazem (CARDIZEM CD) 360 MG 24 hr capsule TAKE 1 CAPSULE (360 MG TOTAL) BY MOUTH DAILY.  Marland Kitchen EPINEPHrine 0.3 mg/0.3 mL IJ SOAJ injection Inject into the muscle.  . furosemide (LASIX) 40 MG tablet Take 1 tablet (40 mg total) by mouth daily.  Marland Kitchen guaiFENesin (MUCINEX) 600 MG 12 hr tablet Take 600 mg by mouth 2 (two) times daily as needed.  Marland Kitchen ibuprofen (ADVIL,MOTRIN) 200 MG tablet Take by mouth as  needed.   Marland Kitchen ipratropium-albuterol (DUONEB) 0.5-2.5 (3) MG/3ML SOLN Take 3 mLs by nebulization every 4 (four) hours as needed.  . levalbuterol (XOPENEX) 0.31 MG/3ML nebulizer solution Take 3 mLs by nebulization every 4 (four) hours as needed. For wheezing/ and or shortness of breath.  . losartan (COZAAR) 25 MG tablet TAKE 1 TABLET BY MOUTH EVERY DAY  . montelukast (SINGULAIR) 10 MG tablet TAKE 1 TABLET BY MOUTH EVERY DAY  . Multiple Vitamin (MULTIVITAMIN) tablet Take 1 tablet by mouth daily.  . NON FORMULARY cpap with oxygen  . nystatin (MYCOSTATIN) 100000 UNIT/ML suspension Take 5 mLs (500,000 Units total) by mouth 4 (four) times daily. (Patient taking differently: Take 5 mLs by mouth 4 (four) times daily as needed. )  . ondansetron (ZOFRAN-ODT) 8 MG disintegrating tablet Take 1 tablet (8 mg total) by mouth every 8 (eight) hours as needed for nausea or vomiting.  . pantoprazole (PROTONIX) 40 MG tablet Take 1 tablet (40 mg total) by mouth daily.  . phentermine (ADIPEX-P) 37.5 MG tablet Take 1 tablet (37.5 mg total) by mouth daily before breakfast.  . predniSONE (DELTASONE) 10 MG tablet Take 1 tablet (10 mg total) by mouth daily with breakfast.  . Probiotic Product (PROBIOTIC-10) CAPS Take 1 capsule by mouth daily.   Marland Kitchen spironolactone (ALDACTONE) 25 MG tablet Take 1 tablet (25 mg total) by mouth 2 (two) times  daily.  . theophylline (THEO-24) 300 MG 24 hr capsule Take 1 capsule (300 mg total) by mouth daily.  . [DISCONTINUED] albuterol (PROVENTIL HFA;VENTOLIN HFA) 108 (90 Base) MCG/ACT inhaler Inhale 2 puffs into the lungs every 4 (four) hours as needed for wheezing or shortness of breath.  . [DISCONTINUED] ALPRAZolam (XANAX) 0.25 MG tablet Take 1 tablet (0.25 mg total) by mouth 2 (two) times daily.  . [DISCONTINUED] phentermine (ADIPEX-P) 37.5 MG tablet Take 1 tablet (37.5 mg total) by mouth daily before breakfast.   No facility-administered encounter medications on file as of 07/07/2019.      Surgical History: Past Surgical History:  Procedure Laterality Date  . cataract surgery    . INCISION AND DRAINAGE ABSCESS Right 06/29/2016   Procedure: INCISION AND DRAINAGE ABSCESS;  Surgeon: Florene Glen, MD;  Location: [facility] ORS;  Service: General;  Laterality: Right;  . INCISION AND DRAINAGE OF WOUND Left 06/29/2016   Procedure: IRRIGATION AND DEBRIDEMENT WOUND;  Surgeon: Florene Glen, MD;  Location: [facility] ORS;  Service: General;  Laterality: Left;    Medical History: Past Medical History:  Diagnosis Date  . Asthma   . Chronic atrial fibrillation    a. diagnosed in 09/2016; b. failed flecainide and propafenone due to LE swelling and SOB, could not afford Multaq; c. CHADS2VASc => 3 (HTN, age x 1, female); d. on Eliquis  . GERD (gastroesophageal reflux disease)   . History of stress test    a. Lexiscan Myoview 10/2016: no evidence of ischemia, EF 53%  . Hypertension   . Obesity   . Obstructive sleep apnea   . Pulmonary hypertension ([place])   . Systolic dysfunction    a. TTE 10/2016: EF 50%, mild LVH, moderately dilated LA, moderate MR/TR, mild pulmonary hypertension    Family History: Family History  Problem Relation Age of Onset  . Dementia Mother   . Osteoporosis Mother   . Vascular Disease Mother   . COPD Father     Social History   Socioeconomic History  . Marital status: Married    Spouse name: Not on file  . Number of children: Not on file  . Years of education: Not on file  . Highest education level: Not on file  Occupational History  . Not on file  Social Needs  . Financial resource strain: Not on file  . Food insecurity    Worry: Not on file    Inability: Not on file  . Transportation needs    Medical: Not on file    Non-medical: Not on file  Tobacco Use  . Smoking status: Never Smoker  . Smokeless tobacco: Never Used  Substance and Sexual Activity  . Alcohol use: No  . Drug use: No  . Sexual activity: Not on file  Lifestyle  . Physical  activity    Days per week: Not on file    Minutes per session: Not on file  . Stress: Not on file  Relationships  . Social Herbalist on phone: Not on file    Gets together: Not on file    Attends religious service: Not on file    Active member of club or organization: Not on file    Attends meetings of clubs or organizations: Not on file    Relationship status: Not on file  . Intimate partner violence    Fear of current or ex partner: Not on file    Emotionally abused: Not on file    Physically  abused: Not on file    Forced sexual activity: Not on file  Other Topics Concern  . Not on file  Social History Narrative  . Not on file      Review of Systems  Constitutional: Positive for fatigue. Negative for activity change, chills and unexpected weight change.  HENT: Negative for congestion, postnasal drip, rhinorrhea, sneezing and sore throat.   Respiratory: Positive for shortness of breath and wheezing. Negative for cough and chest tightness.   Cardiovascular: Positive for leg swelling. Negative for chest pain and palpitations.  Gastrointestinal: Negative for nausea.  Endocrine: Negative for cold intolerance, heat intolerance, polydipsia and polyuria.  Musculoskeletal: Positive for arthralgias and joint swelling. Negative for back pain and neck pain.       Swelling and tenderness of right ankle and right lower leg. Tender at the end of the day.   Skin: Negative for rash.  Allergic/Immunologic: Positive for environmental allergies.  Neurological: Positive for headaches. Negative for dizziness, tremors and numbness.  Hematological: Negative for adenopathy. Does not bruise/bleed easily.  Psychiatric/Behavioral: Behavioral problem: Depression. The patient is nervous/anxious.     Today's Vitals   07/07/19 1602  BP: 124/70  Pulse: 85  Resp: 16  SpO2: 96%  Weight: 273 lb 6.4 oz (124 kg)  Height: 5' 4.5" (1.638 m)   Body mass index is 46.2 kg/m.  Physical  Exam Vitals signs and nursing note reviewed.  Constitutional:      General: She is not in acute distress.    Appearance: Normal appearance. She is well-developed. She is obese. She is not diaphoretic.  HENT:     Head: Normocephalic and atraumatic.     Mouth/Throat:     Pharynx: No oropharyngeal exudate.  Eyes:     Pupils: Pupils are equal, round, and reactive to light.  Neck:     Musculoskeletal: Normal range of motion and neck supple.     Thyroid: No thyromegaly.     Vascular: No carotid bruit or JVD.     Trachea: No tracheal deviation.  Cardiovascular:     Rate and Rhythm: Normal rate. Rhythm irregular.     Heart sounds: Murmur present. No friction rub. No gallop.      Comments: Moderate varicose veins, bilaterally. Pulmonary:     Effort: Pulmonary effort is normal. No respiratory distress.     Breath sounds: Normal breath sounds. No wheezing or rales.  Chest:     Chest wall: No tenderness.  Musculoskeletal: Normal range of motion.     Comments: Moderate swelling at the posterior aspect of right lower extremity. Tender when palpated. Increases when on her feet for long periods of time.   Lymphadenopathy:     Cervical: No cervical adenopathy.  Skin:    General: Skin is warm and dry.  Neurological:     Mental Status: She is alert and oriented to person, place, and time.     Cranial Nerves: No cranial nerve deficit.  Psychiatric:        Behavior: Behavior normal.        Thought Content: Thought content normal.        Judgment: Judgment normal.   Assessment/Plan: 1. SOB (shortness of breath) Continue to use inhalers and respiratory medications as prescribed. Renew rescue inhaler to use as needed and as prescribed  - albuterol (VENTOLIN HFA) 108 (90 Base) MCG/ACT inhaler; Inhale 2 puffs into the lungs every 4 (four) hours as needed for wheezing or shortness of breath.  Dispense: 18 g; Refill:  3  2. Peripheral edema Reviewed results of venous doppler of right lower extremity.  No evidence of clots or occlusion. Did have some venous reflux and soft tissue swelling present. Will continue to monitor closely.   3. Atherosclerosis of both carotid arteries Carotid doppler study ordered.  - US Carotid Duplex Bilateral; Future  4. Generalized anxiety disorder May continue to take alprazolam 0.25mg  twice daily if needed for acute anxiety. New prescription sent to her pharmacy today.  - ALPRAZolam (XANAX) 0.25 MG tablet; Take 1 tablet (0.25 mg total) by mouth 2 (two) times daily.  Dispense: 45 tablet; Refill: 3  5. Body mass index (BMI) 45.0-49.9, adult (HCC) Restart phentermine. Recommend limiting calorie intake to 1200-1500 calories per day. Incorporate regular exercise into daily routine.  - phentermine (ADIPEX-P) 37.5 MG tablet; Take 1 tablet (37.5 mg total) by mouth daily before breakfast.  Dispense: 30 tablet; Refill: 1  6. Obstructive chronic bronchitis without exacerbation (HCC) Inhalers and respiratory medications should be continued as prescribed  - albuterol (VENTOLIN HFA) 108 (90 Base) MCG/ACT inhaler; Inhale 2 puffs into the lungs every 4 (four) hours as needed for wheezing or shortness of breath.  Dispense: 18 g; Refill: 3  General Counseling: Kieren verbalizes understanding of the findings of todays visit and agrees with plan of treatment. I have discussed any further diagnostic evaluation that may be needed or ordered today. We also reviewed her medications today. she has been encouraged to call the office with any questions or concerns that should arise related to todays visit.   There is a liability release in patients' chart. There has been a 10 minute discussion about the side effects including but not limited to elevated blood pressure, anxiety, lack of sleep and dry mouth. Pt understands and will like to start/continue on appetite suppressant at this time. There will be one month RX given at the time of visit with proper follow up. [place] diet plan with  restricted calories is given to the pt. Pt understands and agrees with  plan of treatment  This patient was seen by Leretha Pol FNP Collaboration with Dr [name] as a part of collaborative care agreement  Orders Placed This Encounter  Procedures  . US Carotid Duplex Bilateral    Meds ordered this encounter  Medications  . ALPRAZolam (XANAX) 0.25 MG tablet    Sig: Take 1 tablet (0.25 mg total) by mouth 2 (two) times daily.    Dispense:  45 tablet    Refill:  3    Order Specific Question:   Supervising Provider    Answer:   [name] [[id number]]  . phentermine (ADIPEX-P) 37.5 MG tablet    Sig: Take 1 tablet (37.5 mg total) by mouth daily before breakfast.    Dispense:  30 tablet    Refill:  1    Order Specific Question:   Supervising Provider    Answer:   [name] [place]  . albuterol (VENTOLIN HFA) 108 (90 Base) MCG/ACT inhaler    Sig: Inhale 2 puffs into the lungs every 4 (four) hours as needed for wheezing or shortness of breath.    Dispense:  18 g    Refill:  3    Order Specific Question:   Supervising Provider    Answer:   [name] [[id number]]    Time spent: 33 Minutes      Dr [name] Internal medicine

## 2019-07-12 ENCOUNTER — Other Ambulatory Visit: Payer: Self-pay | Admitting: Internal Medicine

## 2019-07-12 DIAGNOSIS — R600 Localized edema: Secondary | ICD-10-CM

## 2019-07-12 DIAGNOSIS — I482 Chronic atrial fibrillation, unspecified: Secondary | ICD-10-CM

## 2019-07-12 DIAGNOSIS — R0602 Shortness of breath: Secondary | ICD-10-CM

## 2019-07-17 DIAGNOSIS — R600 Localized edema: Secondary | ICD-10-CM | POA: Insufficient documentation

## 2019-07-17 DIAGNOSIS — I6523 Occlusion and stenosis of bilateral carotid arteries: Secondary | ICD-10-CM | POA: Insufficient documentation

## 2019-07-17 DIAGNOSIS — R609 Edema, unspecified: Secondary | ICD-10-CM | POA: Insufficient documentation

## 2019-07-19 DIAGNOSIS — Z9842 Cataract extraction status, left eye: Secondary | ICD-10-CM | POA: Diagnosis not present

## 2019-07-19 DIAGNOSIS — H25812 Combined forms of age-related cataract, left eye: Secondary | ICD-10-CM | POA: Diagnosis not present

## 2019-07-19 DIAGNOSIS — H25012 Cortical age-related cataract, left eye: Secondary | ICD-10-CM | POA: Diagnosis not present

## 2019-07-19 DIAGNOSIS — H2512 Age-related nuclear cataract, left eye: Secondary | ICD-10-CM | POA: Diagnosis not present

## 2019-07-20 ENCOUNTER — Ambulatory Visit: Payer: BC Managed Care – PPO | Admitting: Internal Medicine

## 2019-07-22 ENCOUNTER — Ambulatory Visit (INDEPENDENT_AMBULATORY_CARE_PROVIDER_SITE_OTHER): Payer: BC Managed Care – PPO

## 2019-07-22 ENCOUNTER — Other Ambulatory Visit: Payer: Self-pay

## 2019-07-22 DIAGNOSIS — I6523 Occlusion and stenosis of bilateral carotid arteries: Secondary | ICD-10-CM

## 2019-07-25 ENCOUNTER — Other Ambulatory Visit: Payer: Self-pay | Admitting: Internal Medicine

## 2019-07-27 ENCOUNTER — Other Ambulatory Visit: Payer: Self-pay

## 2019-07-27 MED ORDER — LOSARTAN POTASSIUM 25 MG PO TABS: 25.0000 mg | ORAL_TABLET | Freq: Every day | ORAL | 2 refills | Status: DC

## 2019-07-27 MED ORDER — SPIRONOLACTONE 25 MG PO TABS: 25.0000 mg | ORAL_TABLET | Freq: Two times a day (BID) | ORAL | 0 refills | Status: DC

## 2019-07-29 ENCOUNTER — Ambulatory Visit: Payer: BC Managed Care – PPO

## 2019-07-29 ENCOUNTER — Encounter (INDEPENDENT_AMBULATORY_CARE_PROVIDER_SITE_OTHER): Payer: Self-pay

## 2019-07-29 ENCOUNTER — Other Ambulatory Visit: Payer: Self-pay

## 2019-07-29 DIAGNOSIS — R0602 Shortness of breath: Secondary | ICD-10-CM

## 2019-07-29 DIAGNOSIS — R600 Localized edema: Secondary | ICD-10-CM

## 2019-08-01 ENCOUNTER — Ambulatory Visit: Payer: Self-pay | Admitting: Nurse Practitioner

## 2019-08-02 ENCOUNTER — Telehealth: Payer: Self-pay

## 2019-08-02 ENCOUNTER — Other Ambulatory Visit: Payer: Self-pay | Admitting: Nurse Practitioner

## 2019-08-02 MED ORDER — DEXILANT 60 MG PO CPDR: 60.0000 mg | DELAYED_RELEASE_CAPSULE | Freq: Every day | ORAL | 2 refills | Status: DC

## 2019-08-03 ENCOUNTER — Other Ambulatory Visit: Payer: Self-pay

## 2019-08-03 ENCOUNTER — Ambulatory Visit (INDEPENDENT_AMBULATORY_CARE_PROVIDER_SITE_OTHER): Payer: BC Managed Care – PPO | Admitting: Internal Medicine

## 2019-08-03 DIAGNOSIS — J4550 Severe persistent asthma, uncomplicated: Secondary | ICD-10-CM

## 2019-08-03 LAB — PULMONARY FUNCTION TEST

## 2019-08-09 ENCOUNTER — Telehealth (INDEPENDENT_AMBULATORY_CARE_PROVIDER_SITE_OTHER): Payer: Self-pay | Admitting: Nurse Practitioner

## 2019-08-09 ENCOUNTER — Telehealth: Payer: Self-pay

## 2019-08-09 NOTE — Telephone Encounter (Signed)
Pt dexilant was sent to pharm per heather on 08/02/19

## 2019-08-09 NOTE — Telephone Encounter (Signed)
lmom to pt regarding her leg swollen please call us need to seen or call vascular dr

## 2019-08-10 NOTE — Telephone Encounter (Signed)
We can bring her in .me or GS

## 2019-08-11 ENCOUNTER — Telehealth: Payer: Self-pay

## 2019-08-11 NOTE — Telephone Encounter (Signed)
Spoke with pt she called vein and vascular Dr and she had appt with them tomorrow

## 2019-08-12 ENCOUNTER — Ambulatory Visit (INDEPENDENT_AMBULATORY_CARE_PROVIDER_SITE_OTHER): Payer: Medicare Other | Admitting: Nurse Practitioner

## 2019-08-12 ENCOUNTER — Encounter (INDEPENDENT_AMBULATORY_CARE_PROVIDER_SITE_OTHER): Payer: Self-pay | Admitting: Nurse Practitioner

## 2019-08-12 ENCOUNTER — Other Ambulatory Visit: Payer: Self-pay

## 2019-08-12 VITALS — BP 154/81 | HR 79 | Resp 16 | Ht 64.0 in | Wt 281.0 lb

## 2019-08-12 DIAGNOSIS — K2100 Gastro-esophageal reflux disease with esophagitis, without bleeding: Secondary | ICD-10-CM

## 2019-08-12 DIAGNOSIS — I10 Essential (primary) hypertension: Secondary | ICD-10-CM

## 2019-08-12 DIAGNOSIS — I8311 Varicose veins of right lower extremity with inflammation: Secondary | ICD-10-CM | POA: Diagnosis not present

## 2019-08-16 ENCOUNTER — Encounter: Payer: Self-pay | Admitting: Nurse Practitioner

## 2019-08-16 ENCOUNTER — Ambulatory Visit (INDEPENDENT_AMBULATORY_CARE_PROVIDER_SITE_OTHER): Payer: BC Managed Care – PPO | Admitting: Nurse Practitioner

## 2019-08-16 ENCOUNTER — Other Ambulatory Visit: Payer: Self-pay

## 2019-08-16 VITALS — BP 139/70 | HR 70 | Resp 16 | Ht 64.0 in | Wt 285.0 lb

## 2019-08-16 DIAGNOSIS — Z6841 Body Mass Index (BMI) 40.0 and over, adult: Secondary | ICD-10-CM

## 2019-08-16 DIAGNOSIS — R0602 Shortness of breath: Secondary | ICD-10-CM | POA: Diagnosis not present

## 2019-08-16 DIAGNOSIS — J449 Chronic obstructive pulmonary disease, unspecified: Secondary | ICD-10-CM | POA: Diagnosis not present

## 2019-08-16 DIAGNOSIS — I872 Venous insufficiency (chronic) (peripheral): Secondary | ICD-10-CM | POA: Diagnosis not present

## 2019-08-16 NOTE — Progress Notes (Signed)
[facility] [place], [place] [address]  Internal MEDICINE  Office Visit Note  Patient Name: Laura [name]  [id number]  [id number]  Date of Service: 08/17/2019  Chief Complaint  Patient presents with  . Follow-up    echo results  . Referral    nutritionist  . Medication Management    theo 24 to another med due to co pay being high    The patient is here for follow up. She has had echocardiogram since her last visit. The carotid showing normal LVEF with mild biatrial enlargement. There is some restrictive diastolic dysfunction, and some valvular insufficiency. She did have a carotid doppler done. Results are not available at this time. Will call her to discuss results when available.  The patient continues to struggle with weight management. She has stopped taking phentermine at this time. She feels like her blood pressure and heart does better without it. She does feel as though she is gaining weight more rapidly. She has gained 12 pounds over the past four weeks. She would like to have a referral to dietician to help her balance diet and exercise with her heart and lung issues.       Current Medication: Outpatient Encounter Medications as of 08/16/2019  Medication Sig  . albuterol (VENTOLIN HFA) 108 (90 Base) MCG/ACT inhaler Inhale 2 puffs into the lungs every 4 (four) hours as needed for wheezing or shortness of breath.  . ALPRAZolam (XANAX) 0.25 MG tablet Take 1 tablet (0.25 mg total) by mouth 2 (two) times daily.  Marland Kitchen apixaban (ELIQUIS) 5 MG TABS tablet Take 1 tablet (5 mg total) by mouth 2 (two) times daily.  Marland Kitchen dexlansoprazole (DEXILANT) 60 MG capsule Take 1 capsule (60 mg total) by mouth daily.  Marland Kitchen EPINEPHrine 0.3 mg/0.3 mL IJ SOAJ injection Inject into the muscle.  . furosemide (LASIX) 40 MG tablet Take 1 tablet (40 mg total) by mouth daily.  Marland Kitchen guaiFENesin (MUCINEX) 600 MG 12 hr tablet Take 600 mg by mouth 2 (two) times daily as needed.  Marland Kitchen ibuprofen  (ADVIL,MOTRIN) 200 MG tablet Take by mouth as needed.   Marland Kitchen ipratropium-albuterol (DUONEB) 0.5-2.5 (3) MG/3ML SOLN Take 3 mLs by nebulization every 4 (four) hours as needed.  . levalbuterol (XOPENEX) 0.31 MG/3ML nebulizer solution Take 3 mLs by nebulization every 4 (four) hours as needed. For wheezing/ and or shortness of breath.  . losartan (COZAAR) 25 MG tablet Take 1 tablet (25 mg total) by mouth daily.  . montelukast (SINGULAIR) 10 MG tablet TAKE 1 TABLET BY MOUTH EVERY DAY  . Multiple Vitamin (MULTIVITAMIN) tablet Take 1 tablet by mouth daily.  . NON FORMULARY cpap with oxygen  . nystatin (MYCOSTATIN) 100000 UNIT/ML suspension Take 5 mLs (500,000 Units total) by mouth 4 (four) times daily. (Patient taking differently: Take 5 mLs by mouth 4 (four) times daily as needed. )  . ondansetron (ZOFRAN-ODT) 8 MG disintegrating tablet Take 1 tablet (8 mg total) by mouth every 8 (eight) hours as needed for nausea or vomiting.  . pantoprazole (PROTONIX) 40 MG tablet Take 1 tablet (40 mg total) by mouth daily.  . phentermine (ADIPEX-P) 37.5 MG tablet Take 1 tablet (37.5 mg total) by mouth daily before breakfast.  . predniSONE (DELTASONE) 10 MG tablet Take 1 tablet (10 mg total) by mouth daily with breakfast.  . Probiotic Product (PROBIOTIC-10) CAPS Take 1 capsule by mouth daily.   Marland Kitchen spironolactone (ALDACTONE) 25 MG tablet Take 1 tablet (25 mg total) by mouth  2 (two) times daily.  . theophylline (THEO-24) 300 MG 24 hr capsule Take 1 capsule (300 mg total) by mouth daily.  Marland Kitchen diltiazem (CARDIZEM CD) 360 MG 24 hr capsule TAKE 1 CAPSULE (360 MG TOTAL) BY MOUTH DAILY.   No facility-administered encounter medications on file as of 08/16/2019.     Surgical History: Past Surgical History:  Procedure Laterality Date  . cataract surgery    . INCISION AND DRAINAGE ABSCESS Right 06/29/2016   Procedure: INCISION AND DRAINAGE ABSCESS;  Surgeon: Florene Glen, MD;  Location: [facility] ORS;  Service: General;  Laterality:  Right;  . INCISION AND DRAINAGE OF WOUND Left 06/29/2016   Procedure: IRRIGATION AND DEBRIDEMENT WOUND;  Surgeon: Florene Glen, MD;  Location: [facility] ORS;  Service: General;  Laterality: Left;    Medical History: Past Medical History:  Diagnosis Date  . Asthma   . Chronic atrial fibrillation    a. diagnosed in 09/2016; b. failed flecainide and propafenone due to LE swelling and SOB, could not afford Multaq; c. CHADS2VASc => 3 (HTN, age x 1, female); d. on Eliquis  . GERD (gastroesophageal reflux disease)   . History of stress test    a. Lexiscan Myoview 10/2016: no evidence of ischemia, EF 53%  . Hypertension   . Obesity   . Obstructive sleep apnea   . Pulmonary hypertension ([place])   . Systolic dysfunction    a. TTE 10/2016: EF 50%, mild LVH, moderately dilated LA, moderate MR/TR, mild pulmonary hypertension    Family History: Family History  Problem Relation Age of Onset  . Dementia Mother   . Osteoporosis Mother   . Vascular Disease Mother   . COPD Father     Social History   Socioeconomic History  . Marital status: Married    Spouse name: Not on file  . Number of children: Not on file  . Years of education: Not on file  . Highest education level: Not on file  Occupational History  . Not on file  Social Needs  . Financial resource strain: Not on file  . Food insecurity    Worry: Not on file    Inability: Not on file  . Transportation needs    Medical: Not on file    Non-medical: Not on file  Tobacco Use  . Smoking status: Never Smoker  . Smokeless tobacco: Never Used  Substance and Sexual Activity  . Alcohol use: No  . Drug use: No  . Sexual activity: Not on file  Lifestyle  . Physical activity    Days per week: Not on file    Minutes per session: Not on file  . Stress: Not on file  Relationships  . Social Herbalist on phone: Not on file    Gets together: Not on file    Attends religious service: Not on file    Active member of club or  organization: Not on file    Attends meetings of clubs or organizations: Not on file    Relationship status: Not on file  . Intimate partner violence    Fear of current or ex partner: Not on file    Emotionally abused: Not on file    Physically abused: Not on file    Forced sexual activity: Not on file  Other Topics Concern  . Not on file  Social History Narrative  . Not on file      Review of Systems  Constitutional: Positive for fatigue and unexpected weight  change. Negative for activity change and chills.       Weight gain 12 pounds since most recent visit   HENT: Negative for congestion, postnasal drip, rhinorrhea, sneezing and sore throat.   Respiratory: Positive for shortness of breath and wheezing. Negative for cough and chest tightness.   Cardiovascular: Positive for leg swelling. Negative for chest pain and palpitations.  Gastrointestinal: Negative for nausea.  Endocrine: Negative for cold intolerance, heat intolerance, polydipsia and polyuria.  Musculoskeletal: Positive for arthralgias and joint swelling. Negative for back pain and neck pain.       Swelling and tenderness of right ankle and right lower leg. Tender at the end of the day.   Skin: Negative for rash.  Allergic/Immunologic: Positive for environmental allergies.  Neurological: Positive for headaches. Negative for dizziness, tremors and numbness.  Hematological: Negative for adenopathy. Does not bruise/bleed easily.  Psychiatric/Behavioral: Behavioral problem: Depression. The patient is nervous/anxious.     Today's Vitals   08/16/19 1635  BP: 139/70  Pulse: 70  Resp: 16  SpO2: 96%  Weight: 285 lb (129.3 kg)  Height: 5\' 4"  (1.626 m)   Body mass index is 48.92 kg/m.  Physical Exam Vitals signs and nursing note reviewed.  Constitutional:      General: She is not in acute distress.    Appearance: Normal appearance. She is well-developed. She is obese. She is not diaphoretic.  HENT:     Head:  Normocephalic and atraumatic.     Mouth/Throat:     Pharynx: No oropharyngeal exudate.  Eyes:     Pupils: Pupils are equal, round, and reactive to light.  Neck:     Musculoskeletal: Normal range of motion and neck supple.     Thyroid: No thyromegaly.     Vascular: No carotid bruit or JVD.     Trachea: No tracheal deviation.  Cardiovascular:     Rate and Rhythm: Normal rate. Rhythm irregular.     Heart sounds: Murmur present. No friction rub. No gallop.      Comments: Moderate varicose veins, bilaterally. Pulmonary:     Effort: Pulmonary effort is normal. No respiratory distress.     Breath sounds: Normal breath sounds. No wheezing or rales.  Chest:     Chest wall: No tenderness.  Musculoskeletal: Normal range of motion.     Comments: Moderate swelling at the posterior aspect of right lower extremity. Tender when palpated. Increases when on her feet for long periods of time.   Lymphadenopathy:     Cervical: No cervical adenopathy.  Skin:    General: Skin is warm and dry.  Neurological:     Mental Status: She is alert and oriented to person, place, and time.     Cranial Nerves: No cranial nerve deficit.  Psychiatric:        Behavior: Behavior normal.        Thought Content: Thought content normal.        Judgment: Judgment normal.    Assessment/Plan: 1. SOB (shortness of breath) Reviewed echocardiogram with the patient. She has normal LVEF with mild atrial enlargement and mild valvular insufficiency. She is routinely followed by cardiology  2. Obstructive chronic bronchitis without exacerbation ([place]) Generally well controlled. Continue inhalers and respiratory medications as prescribed.   3. Body mass index (BMI) 45.0-49.9, [facility]) May continue to take phentermine as prescribed. Will refer to dietician for further evaluation.  - Ambulatory Referral to DSME/T  4. Chronic venous stasis dermatitis of right lower extremity Patient has seen  vein and vascular. She is to try  fitted surgical stockings to help with pain and swelling. She should follow up with them as scheduled.   General Counseling: wardell mulready understanding of the findings of todays visit and agrees with plan of treatment. I have discussed any further diagnostic evaluation that may be needed or ordered today. We also reviewed her medications today. she has been encouraged to call the office with any questions or concerns that should arise related to todays visit.  Obesity Counseling: Risk Assessment: An assessment of behavioral risk factors was made today and includes lack of exercise sedentary lifestyle, lack of portion control and poor dietary habits.  Risk Modification Advice: She was counseled on portion control guidelines. Restricting daily caloric intake to. . The detrimental long term effects of obesity on her health and ongoing poor compliance was also discussed with the patient.  This patient was seen by Leretha Pol FNP Collaboration with Dr [name] as a part of collaborative care agreement  Orders Placed This Encounter  Procedures  . Ambulatory Referral to DSME/T    Time spent: 25 Minutes      Dr [name] Internal medicine

## 2019-08-18 NOTE — Procedures (Signed)
[place], [place] [address]  DATE OF SERVICE: July 22, 2019  CAROTID DOPPLER INTERPRETATION:  Bilateral Carotid Ultrsasound and Color Doppler Examination was performed. The RIGHT CCA shows no significant plaque in the vessel. The LEFT CCA shows no significant plaque in the vessel. There was no significant intimal thickening noted in the RIGHT carotid artery. There was no significant intimal thickening in the LEFT carotid artery.  The RIGHT CCA shows peak systolic velocity of 88 cm per second. The end diastolic velocity is 25 cm per second on the RIGHT side. The RIGHT ICA shows peak systolic velocity of 71 per second. RIGHT sided ICA end diastolic velocity is 25 cm per second. The RIGHT ECA shows a peak systolic velocity of 82 cm per second. The ICA/CCA ratio is calculated to be 0.55. This suggests less than 50% stenosis. The Vertebral Artery shows antegrade flow.  The LEFT CCA shows peak systolic velocity of 75 cm per second. The end diastolic velocity is 27 cm per second on the LEFT side. The LEFT ICA shows peak systolic velocity of [id number] per second. LEFT sided ICA end diastolic velocity is 28 cm per second. The LEFT ECA shows a peak systolic velocity of 77 cm per second. The ICA/CCA ratio is calculated to be 0.76. This suggests less than 50% stenosis. The Vertebral Artery shows antegrade flow.   Impression:    The RIGHT CAROTID shows less than 50% stenosis. The LEFT CAROTID shows less than 50% stenosis.  There is no significant plaque formation noted on the LEFT and no significant plaque on the RIGHT  side. Consider a repeat Carotid doppler if clinical situation and symptoms warrant in 6-12 months. Patient should be encouraged to change lifestyles such as smoking cessation, regular exercise and dietary modification. Use of statins in the right clinical setting and ASA is encouraged.  Allyne Gee, MD [name] Pulmonary Critical Care Medicine

## 2019-08-18 NOTE — Progress Notes (Signed)
SUBJECTIVE:  Patient ID: Laura [name], female    DOB: 06-14-1951, 68 y.o.   MRN: [id number] Chief Complaint  Patient presents with  . Follow-up    HPI  Laura [name] is a 68 y.o. female The patient is seen for evaluation of symptomatic varicose veins. The patient relates burning and stinging which worsened steadily throughout the course of the day, particularly with standing. The patient also notes an aching and throbbing pain over the varicosities, particularly with prolonged dependent positions. The symptoms are significantly improved with elevation.  The patient also notes that during hot weather the symptoms are greatly intensified. The patient states the pain from the varicose veins interferes with work, daily exercise, shopping and household maintenance. At this point, the symptoms are persistent and severe enough that they're having a negative impact on lifestyle and are interfering with daily activities.  There is no history of DVT, PE or superficial thrombophlebitis. There is no history of ulceration or hemorrhage. The patient denies a significant family history of varicose veins.   The patient has worn graduated compression in the past, however not consistently. At the present time the patient has not been using over-the-counter analgesics. There is no history of prior surgical intervention or sclerotherapy.  The patient underwent duplex of her right lower extremity a different facility.  Ultrasound results show no evidence of DVT or superficial venous thrombosis.  There is reflux within the great saphenous vein.  There is a soft tissue edema seen in the lower leg.  Past Medical History:  Diagnosis Date  . Asthma   . Chronic atrial fibrillation    a. diagnosed in 09/2016; b. failed flecainide and propafenone due to LE swelling and SOB, could not afford Multaq; c. CHADS2VASc => 3 (HTN, age x 1, female); d. on Eliquis  . GERD (gastroesophageal reflux disease)   . History of  stress test    a. Lexiscan Myoview 10/2016: no evidence of ischemia, EF 53%  . Hypertension   . Obesity   . Obstructive sleep apnea   . Pulmonary hypertension ([place])   . Systolic dysfunction    a. TTE 10/2016: EF 50%, mild LVH, moderately dilated LA, moderate MR/TR, mild pulmonary hypertension    Past Surgical History:  Procedure Laterality Date  . cataract surgery    . INCISION AND DRAINAGE ABSCESS Right 06/29/2016   Procedure: INCISION AND DRAINAGE ABSCESS;  Surgeon: Florene Glen, MD;  Location: [facility] ORS;  Service: General;  Laterality: Right;  . INCISION AND DRAINAGE OF WOUND Left 06/29/2016   Procedure: IRRIGATION AND DEBRIDEMENT WOUND;  Surgeon: Florene Glen, MD;  Location: [facility] ORS;  Service: General;  Laterality: Left;    Social History   Socioeconomic History  . Marital status: Married    Spouse name: Not on file  . Number of children: Not on file  . Years of education: Not on file  . Highest education level: Not on file  Occupational History  . Not on file  Social Needs  . Financial resource strain: Not on file  . Food insecurity    Worry: Not on file    Inability: Not on file  . Transportation needs    Medical: Not on file    Non-medical: Not on file  Tobacco Use  . Smoking status: Never Smoker  . Smokeless tobacco: Never Used  Substance and Sexual Activity  . Alcohol use: No  . Drug use: No  . Sexual activity: Not on file  Lifestyle  .  Physical activity    Days per week: Not on file    Minutes per session: Not on file  . Stress: Not on file  Relationships  . Social Herbalist on phone: Not on file    Gets together: Not on file    Attends religious service: Not on file    Active member of club or organization: Not on file    Attends meetings of clubs or organizations: Not on file    Relationship status: Not on file  . Intimate partner violence    Fear of current or ex partner: Not on file    Emotionally abused: Not on file     Physically abused: Not on file    Forced sexual activity: Not on file  Other Topics Concern  . Not on file  Social History Narrative  . Not on file    Family History  Problem Relation Age of Onset  . Dementia Mother   . Osteoporosis Mother   . Vascular Disease Mother   . COPD Father     Allergies  Allergen Reactions  . Flecainide Shortness Of Breath  . Propafenone Shortness Of Breath and Swelling  . Rivaroxaban Other (See Comments)     Review of Systems   Review of Systems: Negative Unless Checked Constitutional: [] Weight loss  [] Fever  [] Chills Cardiac: [] Chest pain   []  Atrial Fibrillation  [] Palpitations   [] Shortness of breath when laying flat   [] Shortness of breath with exertion. [] Shortness of breath at rest Vascular:  [] Pain in legs with walking   [] Pain in legs with standing [] Pain in legs when laying flat   [] Claudication    [] Pain in feet when laying flat    [] History of DVT   [] Phlebitis   [x] Swelling in legs   [x] Varicose veins   [] Non-healing ulcers Pulmonary:   [] Uses home oxygen   [] Productive cough   [] Hemoptysis   [] Wheeze  [] COPD   [] Asthma Neurologic:  [] Dizziness   [] Seizures  [] Blackouts [] History of stroke   [] History of TIA  [] Aphasia   [] Temporary Blindness   [] Weakness or numbness in arm   [] Weakness or numbness in leg Musculoskeletal:   [] Joint swelling   [] Joint pain   [] Low back pain  []  History of Knee Replacement [] Arthritis [] back Surgeries  []  Spinal Stenosis    Hematologic:  [] Easy bruising  [] Easy bleeding   [] Hypercoagulable state   [] Anemic Gastrointestinal:  [] Diarrhea   [] Vomiting  [] Gastroesophageal reflux/heartburn   [] Difficulty swallowing. [] Abdominal pain Genitourinary:  [] Chronic kidney disease   [] Difficult urination  [] Anuric   [] Blood in urine [] Frequent urination  [] Burning with urination   [] Hematuria Skin:  [] Rashes   [] Ulcers [] Wounds Psychological:  [] History of anxiety   []  History of major depression  []  Memory Difficulties       OBJECTIVE:   Physical Exam  BP (!) 154/81 (BP Location: Left Wrist, Patient Position: Sitting, Cuff Size: Normal)   Pulse 79   Resp 16   Ht 5\' 4"  (1.626 m)   Wt 281 lb (127.5 kg)   BMI 48.23 kg/m   Gen: WD/WN, NAD Head: [place]/AT, No temporalis wasting.  Ear/Nose/Throat: Hearing grossly intact, nares w/o erythema or drainage Eyes: PER, EOMI, sclera nonicteric.  Neck: Supple, no masses.  No JVD.  Pulmonary:  Good air movement, no use of accessory muscles.  Cardiac: RRR Vascular:  2+ hard edema right lower extremity Vessel Right Left  Radial Palpable Palpable  Dorsalis Pedis Palpable Palpable  Posterior Tibial Palpable Palpable   Gastrointestinal: soft, non-distended. No guarding/no peritoneal signs.  Musculoskeletal: M/S 5/5 throughout.  No deformity or atrophy.  Neurologic: Pain and light touch intact in extremities.  Symmetrical.  Speech is fluent. Motor exam as listed above. Psychiatric: Judgment intact, Mood & affect appropriate for pt's clinical situation. Dermatologic:  Stasis dermatitis right lower extremity no Ulcers Noted.  No changes consistent with cellulitis. Lymph : No Cervical lymphadenopathy, no lichenification or skin changes of chronic lymphedema.       ASSESSMENT AND PLAN:  1. Varicose veins of right lower extremity with inflammation  Recommend:  The patient has large symptomatic varicose veins that are painful and associated with swelling.  I have had a long discussion with the patient regarding  varicose veins and why they cause symptoms.  Patient will begin wearing graduated compression stockings class 1 on a daily basis, beginning first thing in the morning and removing them in the evening. The patient is instructed specifically not to sleep in the stockings.    The patient  will also begin using over-the-counter analgesics such as Motrin 600 mg po TID to help control the symptoms.    In addition, behavioral modification including elevation during  the day will be initiated.    Pending the results of these changes the  patient will be reevaluated in three months.   An  ultrasound of the venous system will be obtained.   Further plans will be based on the ultrasound results and whether conservative therapies are successful at eliminating the pain and swelling.   2. Essential (primary) hypertension Continue antihypertensive medications as already ordered, these medications have been reviewed and there are no changes at this time.   3. Gastroesophageal reflux disease with esophagitis Continue PPI as already ordered, this medication has been reviewed and there are no changes at this time.  Avoidence of caffeine and alcohol  Moderate elevation of the head of the bed    Current Outpatient Medications on File Prior to Visit  Medication Sig Dispense Refill  . albuterol (VENTOLIN HFA) 108 (90 Base) MCG/ACT inhaler Inhale 2 puffs into the lungs every 4 (four) hours as needed for wheezing or shortness of breath. 18 g 3  . ALPRAZolam (XANAX) 0.25 MG tablet Take 1 tablet (0.25 mg total) by mouth 2 (two) times daily. 45 tablet 3  . apixaban (ELIQUIS) 5 MG TABS tablet Take 1 tablet (5 mg total) by mouth 2 (two) times daily. 60 tablet 5  . dexlansoprazole (DEXILANT) 60 MG capsule Take 1 capsule (60 mg total) by mouth daily. 30 capsule 2  . diltiazem (CARDIZEM CD) 360 MG 24 hr capsule TAKE 1 CAPSULE (360 MG TOTAL) BY MOUTH DAILY. 90 capsule 2  . EPINEPHrine 0.3 mg/0.3 mL IJ SOAJ injection Inject into the muscle.    . furosemide (LASIX) 40 MG tablet Take 1 tablet (40 mg total) by mouth daily. 90 tablet 1  . guaiFENesin (MUCINEX) 600 MG 12 hr tablet Take 600 mg by mouth 2 (two) times daily as needed.    Marland Kitchen ibuprofen (ADVIL,MOTRIN) 200 MG tablet Take by mouth as needed.     Marland Kitchen ipratropium-albuterol (DUONEB) 0.5-2.5 (3) MG/3ML SOLN Take 3 mLs by nebulization every 4 (four) hours as needed. 120 mL 3  . losartan (COZAAR) 25 MG tablet Take 1 tablet (25 mg  total) by mouth daily. 90 tablet 2  . montelukast (SINGULAIR) 10 MG tablet TAKE 1 TABLET BY MOUTH EVERY DAY 90 tablet 3  . Multiple  Vitamin (MULTIVITAMIN) tablet Take 1 tablet by mouth daily.    Marland Kitchen nystatin (MYCOSTATIN) 100000 UNIT/ML suspension Take 5 mLs (500,000 Units total) by mouth 4 (four) times daily. (Patient taking differently: Take 5 mLs by mouth 4 (four) times daily as needed. ) 200 mL 1  . ondansetron (ZOFRAN-ODT) 8 MG disintegrating tablet Take 1 tablet (8 mg total) by mouth every 8 (eight) hours as needed for nausea or vomiting. 30 tablet 0  . predniSONE (DELTASONE) 10 MG tablet Take 1 tablet (10 mg total) by mouth daily with breakfast. 30 tablet 5  . Probiotic Product (PROBIOTIC-10) CAPS Take 1 capsule by mouth daily.     . theophylline (THEO-24) 300 MG 24 hr capsule Take 1 capsule (300 mg total) by mouth daily. 30 capsule 3  . levalbuterol (XOPENEX) 0.31 MG/3ML nebulizer solution Take 3 mLs by nebulization every 4 (four) hours as needed. For wheezing/ and or shortness of breath.    . NON FORMULARY cpap with oxygen    . pantoprazole (PROTONIX) 40 MG tablet Take 1 tablet (40 mg total) by mouth daily. 30 tablet 3  . phentermine (ADIPEX-P) 37.5 MG tablet Take 1 tablet (37.5 mg total) by mouth daily before breakfast. 30 tablet 1  . spironolactone (ALDACTONE) 25 MG tablet Take 1 tablet (25 mg total) by mouth 2 (two) times daily. 180 tablet 0   No current facility-administered medications on file prior to visit.     There are no Patient Instructions on file for this visit. No follow-ups on file.   Kris Hartmann, NP  This note was completed with Sales executive.  Any errors are purely unintentional.

## 2019-08-18 NOTE — Procedures (Signed)
[place], [address]  DATE OF SERVICE: August 03, 2019  Complete Pulmonary Function Testing Interpretation:  FINDINGS:  The forced vital capacity is normal.  The FEV1 is 1.65 L which is 74% of predicted and is mildly decreased.  FEV1 FVC ratio is.  Postbronchodilator there is no significant improvement in the FEV1 however clinical improvement may occur in the absence of spirometric improvement.  Total lung capacity is normal residual volume is normal residual volume total lung capacity ratio is increased.  The DLCO is within normal limits.  Thoracic gas volume was mildly decreased.  IMPRESSION:  This pulmonary function study is consistent with mild obstructive lung disease.  There does not appear to be significant response to bronchodilator however clinical improvement may still occur in the absence of spirometric improvement.  DLCO was also within normal limits.  Allyne Gee, MD [facility] Critical Care Medicine Sleep Medicine

## 2019-08-22 ENCOUNTER — Telehealth: Payer: Self-pay

## 2019-08-22 NOTE — Telephone Encounter (Signed)
PT ADVISED ABOUT CAROTID DOPPLER LOOKS GOOD SEE OTHER NOTE

## 2019-08-22 NOTE — Telephone Encounter (Signed)
-----   Message from Ronnell Freshwater, NP sent at 08/22/2019  2:10 PM EDT ----- Please let the patient know that carotid doppler looked good. There is no significant plaque in eith carotid artery. There is less than 50% stenosis on both sides. We will repeat the carotid doppler study in one year. Thanks.

## 2019-08-22 NOTE — Progress Notes (Signed)
Please let the patient know that carotid doppler looked good. There is no significant plaque in eith carotid artery. There is less than 50% stenosis on both sides. We will repeat the carotid doppler study in one year. Thanks.

## 2019-08-23 ENCOUNTER — Other Ambulatory Visit: Payer: Self-pay | Admitting: Adult Health

## 2019-08-23 DIAGNOSIS — J4550 Severe persistent asthma, uncomplicated: Secondary | ICD-10-CM

## 2019-08-23 MED ORDER — THEOPHYLLINE ER 300 MG PO CP24: 300.0000 mg | ORAL_CAPSULE | Freq: Every day | ORAL | 3 refills | Status: DC

## 2019-09-06 ENCOUNTER — Other Ambulatory Visit: Payer: Self-pay | Admitting: Nurse Practitioner

## 2019-09-06 DIAGNOSIS — K2100 Gastro-esophageal reflux disease with esophagitis, without bleeding: Secondary | ICD-10-CM

## 2019-09-06 MED ORDER — PANTOPRAZOLE SODIUM 40 MG PO TBEC: 40.0000 mg | DELAYED_RELEASE_TABLET | Freq: Every day | ORAL | 5 refills | Status: DC

## 2019-09-11 ENCOUNTER — Other Ambulatory Visit: Payer: Self-pay

## 2019-09-11 ENCOUNTER — Emergency Department: Payer: BC Managed Care – PPO

## 2019-09-11 ENCOUNTER — Inpatient Hospital Stay: Payer: BC Managed Care – PPO | Admitting: Anesthesiology

## 2019-09-11 ENCOUNTER — Encounter
Admission: EM | Disposition: A | Discharge status: Home Health | Payer: Self-pay | Source: Home / Self Care | Attending: Internal Medicine

## 2019-09-11 ENCOUNTER — Inpatient Hospital Stay: Payer: BC Managed Care – PPO

## 2019-09-11 ENCOUNTER — Inpatient Hospital Stay
Admission: EM | Admit: 2019-09-11 | Discharge: 2019-09-20 | DRG: 330 | Disposition: A | Discharge status: Home Health | Payer: BC Managed Care – PPO | Attending: Internal Medicine | Admitting: Internal Medicine

## 2019-09-11 DIAGNOSIS — Z7901 Long term (current) use of anticoagulants: Secondary | ICD-10-CM | POA: Diagnosis not present

## 2019-09-11 DIAGNOSIS — Z7952 Long term (current) use of systemic steroids: Secondary | ICD-10-CM | POA: Diagnosis not present

## 2019-09-11 DIAGNOSIS — J40 Bronchitis, not specified as acute or chronic: Secondary | ICD-10-CM | POA: Diagnosis not present

## 2019-09-11 DIAGNOSIS — R140 Abdominal distension (gaseous): Secondary | ICD-10-CM

## 2019-09-11 DIAGNOSIS — K219 Gastro-esophageal reflux disease without esophagitis: Secondary | ICD-10-CM | POA: Diagnosis present

## 2019-09-11 DIAGNOSIS — K55039 Acute (reversible) ischemia of large intestine, extent unspecified: Secondary | ICD-10-CM | POA: Diagnosis not present

## 2019-09-11 DIAGNOSIS — R188 Other ascites: Secondary | ICD-10-CM | POA: Diagnosis not present

## 2019-09-11 DIAGNOSIS — R1084 Generalized abdominal pain: Secondary | ICD-10-CM | POA: Diagnosis not present

## 2019-09-11 DIAGNOSIS — Z23 Encounter for immunization: Secondary | ICD-10-CM | POA: Diagnosis not present

## 2019-09-11 DIAGNOSIS — J449 Chronic obstructive pulmonary disease, unspecified: Secondary | ICD-10-CM | POA: Diagnosis not present

## 2019-09-11 DIAGNOSIS — S3991XA Unspecified injury of abdomen, initial encounter: Secondary | ICD-10-CM | POA: Diagnosis not present

## 2019-09-11 DIAGNOSIS — K55011 Focal (segmental) acute (reversible) ischemia of small intestine: Secondary | ICD-10-CM | POA: Diagnosis not present

## 2019-09-11 DIAGNOSIS — J441 Chronic obstructive pulmonary disease with (acute) exacerbation: Secondary | ICD-10-CM | POA: Diagnosis not present

## 2019-09-11 DIAGNOSIS — K529 Noninfective gastroenteritis and colitis, unspecified: Secondary | ICD-10-CM | POA: Diagnosis not present

## 2019-09-11 DIAGNOSIS — K559 Vascular disorder of intestine, unspecified: Secondary | ICD-10-CM | POA: Diagnosis not present

## 2019-09-11 DIAGNOSIS — Z888 Allergy status to other drugs, medicaments and biological substances status: Secondary | ICD-10-CM

## 2019-09-11 DIAGNOSIS — J45909 Unspecified asthma, uncomplicated: Secondary | ICD-10-CM | POA: Diagnosis not present

## 2019-09-11 DIAGNOSIS — R069 Unspecified abnormalities of breathing: Secondary | ICD-10-CM | POA: Diagnosis not present

## 2019-09-11 DIAGNOSIS — G4733 Obstructive sleep apnea (adult) (pediatric): Secondary | ICD-10-CM | POA: Diagnosis not present

## 2019-09-11 DIAGNOSIS — I4819 Other persistent atrial fibrillation: Secondary | ICD-10-CM | POA: Diagnosis not present

## 2019-09-11 DIAGNOSIS — Z825 Family history of asthma and other chronic lower respiratory diseases: Secondary | ICD-10-CM

## 2019-09-11 DIAGNOSIS — Z6841 Body Mass Index (BMI) 40.0 and over, adult: Secondary | ICD-10-CM | POA: Diagnosis not present

## 2019-09-11 DIAGNOSIS — I4891 Unspecified atrial fibrillation: Secondary | ICD-10-CM | POA: Diagnosis not present

## 2019-09-11 DIAGNOSIS — I482 Chronic atrial fibrillation, unspecified: Secondary | ICD-10-CM | POA: Diagnosis not present

## 2019-09-11 DIAGNOSIS — E860 Dehydration: Secondary | ICD-10-CM | POA: Diagnosis not present

## 2019-09-11 DIAGNOSIS — I739 Peripheral vascular disease, unspecified: Secondary | ICD-10-CM | POA: Diagnosis present

## 2019-09-11 DIAGNOSIS — S299XXA Unspecified injury of thorax, initial encounter: Secondary | ICD-10-CM | POA: Diagnosis not present

## 2019-09-11 DIAGNOSIS — Z20828 Contact with and (suspected) exposure to other viral communicable diseases: Secondary | ICD-10-CM | POA: Diagnosis not present

## 2019-09-11 DIAGNOSIS — K55069 Acute infarction of intestine, part and extent unspecified: Secondary | ICD-10-CM | POA: Diagnosis not present

## 2019-09-11 DIAGNOSIS — Z9889 Other specified postprocedural states: Secondary | ICD-10-CM | POA: Diagnosis not present

## 2019-09-11 DIAGNOSIS — K55019 Acute (reversible) ischemia of small intestine, extent unspecified: Secondary | ICD-10-CM | POA: Diagnosis not present

## 2019-09-11 DIAGNOSIS — Z9849 Cataract extraction status, unspecified eye: Secondary | ICD-10-CM

## 2019-09-11 DIAGNOSIS — R0689 Other abnormalities of breathing: Secondary | ICD-10-CM | POA: Diagnosis not present

## 2019-09-11 DIAGNOSIS — Z79899 Other long term (current) drug therapy: Secondary | ICD-10-CM

## 2019-09-11 DIAGNOSIS — K5730 Diverticulosis of large intestine without perforation or abscess without bleeding: Secondary | ICD-10-CM | POA: Diagnosis not present

## 2019-09-11 DIAGNOSIS — I2510 Atherosclerotic heart disease of native coronary artery without angina pectoris: Secondary | ICD-10-CM | POA: Diagnosis not present

## 2019-09-11 DIAGNOSIS — I272 Pulmonary hypertension, unspecified: Secondary | ICD-10-CM | POA: Diagnosis not present

## 2019-09-11 DIAGNOSIS — I10 Essential (primary) hypertension: Secondary | ICD-10-CM

## 2019-09-11 DIAGNOSIS — R000 Tachycardia, unspecified: Secondary | ICD-10-CM | POA: Diagnosis not present

## 2019-09-11 DIAGNOSIS — K55029 Acute infarction of small intestine, extent unspecified: Secondary | ICD-10-CM | POA: Diagnosis not present

## 2019-09-11 DIAGNOSIS — S0990XA Unspecified injury of head, initial encounter: Secondary | ICD-10-CM | POA: Diagnosis not present

## 2019-09-11 DIAGNOSIS — R0602 Shortness of breath: Secondary | ICD-10-CM | POA: Diagnosis not present

## 2019-09-11 DIAGNOSIS — R609 Edema, unspecified: Secondary | ICD-10-CM | POA: Diagnosis not present

## 2019-09-11 HISTORY — PX: LAPAROSCOPIC RIGHT COLECTOMY: SHX5925

## 2019-09-11 HISTORY — PX: LAPAROSCOPY: SHX197

## 2019-09-11 HISTORY — PX: BOWEL RESECTION: SHX1257

## 2019-09-11 LAB — HEPARIN LEVEL (UNFRACTIONATED): Heparin Unfractionated: 2.62 IU/mL — ABNORMAL HIGH (ref 0.30–0.70)

## 2019-09-11 LAB — CBC WITH DIFFERENTIAL/PLATELET
Abs Immature Granulocytes: 0.06 10*3/uL (ref 0.00–0.07)
Basophils Absolute: 0 10*3/uL (ref 0.0–0.1)
Basophils Relative: 0 %
Eosinophils Absolute: 0.1 10*3/uL (ref 0.0–0.5)
Eosinophils Relative: 1 %
HCT: 39.3 % (ref 36.0–46.0)
Hemoglobin: 12.8 g/dL (ref 12.0–15.0)
Immature Granulocytes: 1 %
Lymphocytes Relative: 10 %
Lymphs Abs: 1.2 10*3/uL (ref 0.7–4.0)
MCH: 30.3 pg (ref 26.0–34.0)
MCHC: 32.6 g/dL (ref 30.0–36.0)
MCV: 93.1 fL (ref 80.0–100.0)
Monocytes Absolute: 1 10*3/uL (ref 0.1–1.0)
Monocytes Relative: 9 %
Neutro Abs: 9.4 10*3/uL — ABNORMAL HIGH (ref 1.7–7.7)
Neutrophils Relative %: 79 %
Platelets: 296 10*3/uL (ref 150–400)
RBC: 4.22 MIL/uL (ref 3.87–5.11)
RDW: 13.8 % (ref 11.5–15.5)
WBC: 11.8 10*3/uL — ABNORMAL HIGH (ref 4.0–10.5)
nRBC: 0 % (ref 0.0–0.2)

## 2019-09-11 LAB — PROTIME-INR
INR: 1.2 (ref 0.8–1.2)
Prothrombin Time: 15.4 seconds — ABNORMAL HIGH (ref 11.4–15.2)

## 2019-09-11 LAB — COMPREHENSIVE METABOLIC PANEL
ALT: 18 U/L (ref 0–44)
AST: 24 U/L (ref 15–41)
Albumin: 3.3 g/dL — ABNORMAL LOW (ref 3.5–5.0)
Alkaline Phosphatase: 56 U/L (ref 38–126)
Anion gap: 8 (ref 5–15)
BUN: 16 mg/dL (ref 8–23)
CO2: 26 mmol/L (ref 22–32)
Calcium: 8.3 mg/dL — ABNORMAL LOW (ref 8.9–10.3)
Chloride: 101 mmol/L (ref 98–111)
Creatinine, Ser: 0.93 mg/dL (ref 0.44–1.00)
GFR calc Af Amer: >60 mL/min (ref >60)
GFR calc non Af Amer: >60 mL/min (ref >60)
Glucose, Bld: 108 mg/dL — ABNORMAL HIGH (ref 70–99)
Potassium: 3.9 mmol/L (ref 3.5–5.1)
Sodium: 135 mmol/L (ref 135–145)
Total Bilirubin: 1 mg/dL (ref 0.3–1.2)
Total Protein: 6 g/dL — ABNORMAL LOW (ref 6.5–8.1)

## 2019-09-11 LAB — LACTIC ACID, PLASMA
Lactic Acid, Venous: 1.1 mmol/L (ref 0.5–1.9)
Lactic Acid, Venous: 1.6 mmol/L (ref 0.5–1.9)

## 2019-09-11 LAB — SARS CORONAVIRUS 2 BY RT PCR (HOSPITAL ORDER, PERFORMED IN ~~LOC~~ HOSPITAL LAB): SARS Coronavirus 2: NEGATIVE

## 2019-09-11 LAB — TROPONIN I (HIGH SENSITIVITY): Troponin I (High Sensitivity): 9 ng/L (ref <18)

## 2019-09-11 LAB — APTT: aPTT: 25 seconds (ref 24–36)

## 2019-09-11 SURGERY — LAPAROSCOPY, DIAGNOSTIC
Anesthesia: General | Site: Abdomen

## 2019-09-11 MED ORDER — ETOMIDATE 2 MG/ML IV SOLN
INTRAVENOUS | Status: DC | PRN
  Administered 2019-09-11: 18 mg via INTRAVENOUS

## 2019-09-11 MED ORDER — HEPARIN (PORCINE) 25000 UT/250ML-% IV SOLN
1000.0000 [IU]/h | INTRAVENOUS | Status: DC
  Administered 2019-09-11: 1000 [IU]/h via INTRAVENOUS
  Filled 2019-09-11: qty 250

## 2019-09-11 MED ORDER — LACTATED RINGERS IV SOLN
INTRAVENOUS | Status: DC | PRN
  Administered 2019-09-11: 22:00:00 via INTRAVENOUS

## 2019-09-11 MED ORDER — DEXAMETHASONE SODIUM PHOSPHATE 10 MG/ML IJ SOLN
INTRAMUSCULAR | Status: AC
  Filled 2019-09-11: qty 1

## 2019-09-11 MED ORDER — SODIUM CHLORIDE 0.9 % IV BOLUS
500.0000 mL | Freq: Once | INTRAVENOUS | Status: AC
  Administered 2019-09-11: 500 mL via INTRAVENOUS

## 2019-09-11 MED ORDER — IOHEXOL 350 MG/ML SOLN
100.0000 mL | Freq: Once | INTRAVENOUS | Status: AC | PRN
  Administered 2019-09-11: 100 mL via INTRAVENOUS

## 2019-09-11 MED ORDER — ALBUTEROL SULFATE HFA 108 (90 BASE) MCG/ACT IN AERS
INHALATION_SPRAY | RESPIRATORY_TRACT | Status: AC
  Filled 2019-09-11: qty 6.7

## 2019-09-11 MED ORDER — ETOMIDATE 2 MG/ML IV SOLN
INTRAVENOUS | Status: AC
  Filled 2019-09-11: qty 10

## 2019-09-11 MED ORDER — DEXAMETHASONE SODIUM PHOSPHATE 10 MG/ML IJ SOLN
INTRAMUSCULAR | Status: DC | PRN
  Administered 2019-09-11: 10 mg via INTRAVENOUS

## 2019-09-11 MED ORDER — SUCCINYLCHOLINE CHLORIDE 20 MG/ML IJ SOLN
INTRAMUSCULAR | Status: DC | PRN
  Administered 2019-09-11: 100 mg via INTRAVENOUS

## 2019-09-11 MED ORDER — FENTANYL CITRATE (PF) 100 MCG/2ML IJ SOLN
INTRAMUSCULAR | Status: DC | PRN
  Administered 2019-09-11 – 2019-09-12 (×5): 50 ug via INTRAVENOUS

## 2019-09-11 MED ORDER — HYDROMORPHONE HCL 1 MG/ML IJ SOLN
1.0000 mg | INTRAMUSCULAR | Status: DC | PRN
  Administered 2019-09-11 – 2019-09-20 (×17): 1 mg via INTRAVENOUS
  Filled 2019-09-11 (×19): qty 1

## 2019-09-11 MED ORDER — ROCURONIUM BROMIDE 50 MG/5ML IV SOLN
INTRAVENOUS | Status: AC
  Filled 2019-09-11: qty 1

## 2019-09-11 MED ORDER — METOPROLOL TARTRATE 5 MG/5ML IV SOLN
5.0000 mg | Freq: Three times a day (TID) | INTRAVENOUS | Status: DC
  Administered 2019-09-11 – 2019-09-15 (×10): 5 mg via INTRAVENOUS
  Filled 2019-09-11 (×10): qty 5

## 2019-09-11 MED ORDER — MORPHINE SULFATE (PF) 4 MG/ML IV SOLN
4.0000 mg | Freq: Once | INTRAVENOUS | Status: AC
  Administered 2019-09-11: 4 mg via INTRAVENOUS
  Filled 2019-09-11: qty 1

## 2019-09-11 MED ORDER — ALBUTEROL SULFATE HFA 108 (90 BASE) MCG/ACT IN AERS
INHALATION_SPRAY | RESPIRATORY_TRACT | Status: DC | PRN
  Administered 2019-09-11 – 2019-09-12 (×3): 4 via RESPIRATORY_TRACT

## 2019-09-11 MED ORDER — SODIUM CHLORIDE 0.9 % IV SOLN
INTRAVENOUS | Status: DC
  Administered 2019-09-11 – 2019-09-14 (×6): via INTRAVENOUS

## 2019-09-11 MED ORDER — SUGAMMADEX SODIUM 500 MG/5ML IV SOLN
INTRAVENOUS | Status: AC
  Filled 2019-09-11: qty 5

## 2019-09-11 MED ORDER — LIDOCAINE HCL URETHRAL/MUCOSAL 2 % EX GEL
CUTANEOUS | Status: AC
  Filled 2019-09-11: qty 5

## 2019-09-11 MED ORDER — ROCURONIUM BROMIDE 100 MG/10ML IV SOLN
INTRAVENOUS | Status: DC | PRN
  Administered 2019-09-11: 5 mg via INTRAVENOUS
  Administered 2019-09-11: 45 mg via INTRAVENOUS
  Administered 2019-09-11: 10 mg via INTRAVENOUS
  Administered 2019-09-11: 30 mg via INTRAVENOUS

## 2019-09-11 MED ORDER — LIDOCAINE HCL (CARDIAC) PF 100 MG/5ML IV SOSY
PREFILLED_SYRINGE | INTRAVENOUS | Status: DC | PRN
  Administered 2019-09-11: 100 mg via INTRAVENOUS

## 2019-09-11 MED ORDER — FENTANYL CITRATE (PF) 250 MCG/5ML IJ SOLN
INTRAMUSCULAR | Status: AC
  Filled 2019-09-11: qty 5

## 2019-09-11 MED ORDER — DILTIAZEM HCL 25 MG/5ML IV SOLN
10.0000 mg | Freq: Once | INTRAVENOUS | Status: AC
  Administered 2019-09-11: 10 mg via INTRAVENOUS
  Filled 2019-09-11: qty 5

## 2019-09-11 MED ORDER — IOHEXOL 300 MG/ML  SOLN
125.0000 mL | Freq: Once | INTRAMUSCULAR | Status: AC | PRN
  Administered 2019-09-11: 125 mL via INTRAVENOUS

## 2019-09-11 MED ORDER — OXYMETAZOLINE HCL 0.05 % NA SOLN
NASAL | Status: AC
  Filled 2019-09-11: qty 30

## 2019-09-11 MED ORDER — PHENYLEPHRINE HCL (PRESSORS) 10 MG/ML IV SOLN
INTRAVENOUS | Status: DC | PRN
  Administered 2019-09-11 (×3): 100 ug via INTRAVENOUS

## 2019-09-11 MED ORDER — ONDANSETRON HCL 4 MG/2ML IJ SOLN
INTRAMUSCULAR | Status: AC
  Filled 2019-09-11: qty 2

## 2019-09-11 MED ORDER — SODIUM CHLORIDE 0.9 % IV SOLN
1000.0000 mL | Freq: Once | INTRAVENOUS | Status: AC
  Administered 2019-09-11: 1000 mL via INTRAVENOUS

## 2019-09-11 MED ORDER — PIPERACILLIN-TAZOBACTAM 3.375 G IVPB
3.3750 g | Freq: Three times a day (TID) | INTRAVENOUS | Status: AC
  Administered 2019-09-11 – 2019-09-18 (×21): 3.375 g via INTRAVENOUS
  Filled 2019-09-11 (×21): qty 50

## 2019-09-11 MED ORDER — IPRATROPIUM-ALBUTEROL 0.5-2.5 (3) MG/3ML IN SOLN
3.0000 mL | RESPIRATORY_TRACT | Status: DC | PRN
  Administered 2019-09-11 – 2019-09-15 (×2): 3 mL via RESPIRATORY_TRACT
  Filled 2019-09-11 (×2): qty 3

## 2019-09-11 MED ORDER — ONDANSETRON HCL 4 MG/2ML IJ SOLN
4.0000 mg | Freq: Once | INTRAMUSCULAR | Status: AC
  Administered 2019-09-11: 4 mg via INTRAVENOUS
  Filled 2019-09-11: qty 2

## 2019-09-11 MED ORDER — DILTIAZEM HCL 100 MG IV SOLR
5.0000 mg/h | INTRAVENOUS | Status: DC
  Administered 2019-09-11: 5 mg/h via INTRAVENOUS
  Administered 2019-09-11: 12.5 mg/h via INTRAVENOUS
  Administered 2019-09-12 (×2): 10 mg/h via INTRAVENOUS
  Administered 2019-09-14 (×2): 5 mg/h via INTRAVENOUS
  Filled 2019-09-11 (×7): qty 100

## 2019-09-11 SURGICAL SUPPLY — 42 items
BLADE SURG 15 STRL LF DISP TIS (BLADE) ×2 IMPLANT
BLADE SURG 15 STRL SS (BLADE) ×1
CANISTER SUCT 1200ML W/VALVE (MISCELLANEOUS) ×3 IMPLANT
CANNULA DILATOR 10 W/SLV (CANNULA) IMPLANT
CHLORAPREP W/TINT 26 (MISCELLANEOUS) ×3 IMPLANT
COVER WAND RF STERILE (DRAPES) IMPLANT
DERMABOND ADVANCED (GAUZE/BANDAGES/DRESSINGS)
DERMABOND ADVANCED .7 DNX12 (GAUZE/BANDAGES/DRESSINGS) IMPLANT
ELECT REM PT RETURN 9FT ADLT (ELECTROSURGICAL) ×3
ELECTRODE REM PT RTRN 9FT ADLT (ELECTROSURGICAL) ×2 IMPLANT
GLOVE BIO SURGEON STRL SZ 6.5 (GLOVE) ×9 IMPLANT
GLOVE BIO SURGEON STRL SZ7 (GLOVE) ×3 IMPLANT
GLOVE BIOGEL PI IND STRL 6.5 (GLOVE) ×4 IMPLANT
GLOVE BIOGEL PI INDICATOR 6.5 (GLOVE) ×2
GOWN STRL REUS W/ TWL LRG LVL3 (GOWN DISPOSABLE) ×4 IMPLANT
GOWN STRL REUS W/TWL LRG LVL3 (GOWN DISPOSABLE) ×2
IRRIGATION STRYKERFLOW (MISCELLANEOUS) ×2 IMPLANT
IRRIGATOR STRYKERFLOW (MISCELLANEOUS) ×3
IV NS 1000ML (IV SOLUTION) ×1
IV NS 1000ML BAXH (IV SOLUTION) ×2 IMPLANT
LIGASURE IMPACT 36 18CM CVD LR (INSTRUMENTS) ×3 IMPLANT
LIGASURE LAP MARYLAND 5MM 37CM (ELECTROSURGICAL) ×3 IMPLANT
NDL INSUFF ACCESS 14 VERSASTEP (NEEDLE) ×3 IMPLANT
NEEDLE HYPO 22GX1.5 SAFETY (NEEDLE) IMPLANT
NEEDLE VERESS 14GA 120MM (NEEDLE) ×3 IMPLANT
PACK LAP CHOLECYSTECTOMY (MISCELLANEOUS) ×3 IMPLANT
PAD NEG PRESSURE SENSATRAC (MISCELLANEOUS) ×3 IMPLANT
PENCIL ELECTRO HAND CTR (MISCELLANEOUS) ×3 IMPLANT
RELOAD PROXIMATE 75MM BLUE (ENDOMECHANICALS) ×3 IMPLANT
RETRACTOR WND ALEXIS-O 25 LRG (MISCELLANEOUS) ×2 IMPLANT
RTRCTR WOUND ALEXIS O 25CM LRG (MISCELLANEOUS) ×3
SCISSORS METZENBAUM CVD 33 (INSTRUMENTS) IMPLANT
SET TUBE SMOKE EVAC HIGH FLOW (TUBING) ×3 IMPLANT
SPONGE ABDOMINAL VAC ABTHERA (MISCELLANEOUS) ×3 IMPLANT
SPONGE LAP 18X18 RF (DISPOSABLE) ×6 IMPLANT
STAPLER PROXIMATE 75MM BLUE (STAPLE) ×3 IMPLANT
SUT PROLENE 2 0 FS (SUTURE) ×3 IMPLANT
SUT SILK 2 0SH CR/8 30 (SUTURE) ×3 IMPLANT
SYS LAPSCP GELPORT 120MM (MISCELLANEOUS) ×3
SYSTEM LAPSCP GELPORT 120MM (MISCELLANEOUS) ×2 IMPLANT
TROCAR XCEL BLUNT TIP 100MML (ENDOMECHANICALS) ×3 IMPLANT
TROCAR XCEL NON-BLD 5MMX100MML (ENDOMECHANICALS) ×9 IMPLANT

## 2019-09-11 NOTE — Progress Notes (Signed)
Surgery Follow up Note  Interval History: Patient seen and examined. She continue to be in severe pain just improved to 9/10. Pain very localized to the lower quadrants. Pain does not radiates. No alleviating factor. Aggravating factor is movement of the abdominal wall.   Patient evaluated by Cardiology and heart rate under better control. Still irregular rhythm.   Vascular surgery evaluated the patient and assess that there is no indication for revascularization at this moment.   Vital signs in last 24 hours: [min-max] current  Temp:  [98.9 F (37.2 C)-99 F (37.2 C)] 98.9 F (37.2 C) (09/20 1627) Pulse Rate:  [37-141] 97 (09/20 1903) Resp:  [17-30] 19 (09/20 1627) BP: (91-144)/(55-117) 105/55 (09/20 1903) SpO2:  [92 %-100 %] 97 % (09/20 2020) Weight:  [127 kg-129.6 kg] 129.6 kg (09/20 1627)     Height: 5\' 4"  (162.6 cm) Weight: 129.6 kg BMI (Calculated): 49.03   Physical Exam:  Constitutional: alert, cooperative and no distress  Respiratory: breathing with minimal-labored at rest  Cardiovascular: irregular rate and rythm Gastrointestinal: soft, tender on lower quadrants, and non-distended  Labs:  CBC Latest Ref Rng & Units 09/11/2019 01/28/2019 09/10/2018  WBC 4.0 - 10.5 K/uL 11.8(H) 12.7(H) 7.7  Hemoglobin 12.0 - 15.0 g/dL 12.8 13.2 13.8  Hematocrit 36.0 - 46.0 % 39.3 38.6 41.3  Platelets 150 - 400 K/uL 296 350 310   CMP Latest Ref Rng & Units 09/11/2019 01/28/2019 09/10/2018  Glucose 70 - 99 mg/dL 108(H) 75 88  BUN 8 - 23 mg/dL 16 30(H) 22  Creatinine 0.44 - 1.00 mg/dL 0.93 1.15(H) 1.01(H)  Sodium 135 - 145 mmol/L 135 136 140  Potassium 3.5 - 5.1 mmol/L 3.9 5.0 5.0  Chloride 98 - 111 mmol/L 101 101 100  CO2 22 - 32 mmol/L 26 21 25   Calcium 8.9 - 10.3 mg/dL 8.3(L) 9.5 9.4  Total Protein 6.5 - 8.1 g/dL 6.0(L) - 6.4  Total Bilirubin 0.3 - 1.2 mg/dL 1.0 - 0.6  Alkaline Phos 38 - 126 U/L 56 - 59  AST 15 - 41 U/L 24 - 15  ALT 0 - 44 U/L 18 - 15    Imaging studies: No new  pertinent imaging studies   Assessment/Plan:  Patient re evaluated. Continue with severe pain. Vascular service assess that there is no indication for revascularization at this moment. Due to patient's persistent pain I consider that is indicated to do a diagnostic laparoscopy to rule out bowel ischemia. If patient has ischemia will need bowel resection. Patient high risk due to anticoagulation. She has been out of Eliquis for 24 hours. Patient also high risk for respiratory failure after surgery due to COPD, pulmonary hypertension and sleep apnea.   Patient oriented about plan of surgery that includes diagnostic laparoscopy with possible resection. Patient and husband understood the risk of bowel perforation, fistula, infection, bleeding, respiratory failure, cardiac event, stroke, and even death. The agreed to proceed with surgery.   This follow up encounter was more than 30 minutes most of the time counseling the patient and coordinating plan of care.   Arnold Long, MD

## 2019-09-11 NOTE — Consult Note (Signed)
SURGICAL CONSULTATION NOTE   HISTORY OF PRESENT ILLNESS (HPI):  68 y.o. female presented to [facility] ED for evaluation of abdominal pain since yesterday. She reports pain mostly on both lower quadrants. Pain does not radiates to other part of the body. Patient with nausea but no vomiting. No alleviating or aggravating factor. Pain out of proportion to physical exam. Patient very uncomfortable.  Patient denies rectal bleeding.   Upon arrival to ED, workup shows leukocytoses, no elevation or lactic acid. Initial CT scan shows significant fat stranding on distal ileum. CTA of the abdomen shows stenosis of the ostium of the SMA and celiac axis. She was found on atrial fibrillation controlled with Cardizem.   Surgery is consulted by Dr. Terrilee Files in this context for evaluation and management of suspected bowel ischemia.  PAST MEDICAL HISTORY (PMH):  Past Medical History:  Diagnosis Date  . Asthma   . Chronic atrial fibrillation    a. diagnosed in 09/2016; b. failed flecainide and propafenone due to LE swelling and SOB, could not afford Multaq; c. CHADS2VASc => 3 (HTN, age x 1, female); d. on Eliquis  . GERD (gastroesophageal reflux disease)   . History of stress test    a. Lexiscan Myoview 10/2016: no evidence of ischemia, EF 53%  . Hypertension   . Obesity   . Obstructive sleep apnea   . Pulmonary hypertension ([place])   . Systolic dysfunction    a. TTE 10/2016: EF 50%, mild LVH, moderately dilated LA, moderate MR/TR, mild pulmonary hypertension     PAST SURGICAL HISTORY ([place]):  Past Surgical History:  Procedure Laterality Date  . cataract surgery    . INCISION AND DRAINAGE ABSCESS Right 06/29/2016   Procedure: INCISION AND DRAINAGE ABSCESS;  Surgeon: Florene Glen, MD;  Location: [facility] ORS;  Service: General;  Laterality: Right;  . INCISION AND DRAINAGE OF WOUND Left 06/29/2016   Procedure: IRRIGATION AND DEBRIDEMENT WOUND;  Surgeon: Florene Glen, MD;  Location: [facility] ORS;  Service: General;   Laterality: Left;     MEDICATIONS:  Prior to Admission medications   Medication Sig Start Date End Date Taking? Authorizing Provider  albuterol (VENTOLIN HFA) 108 (90 Base) MCG/ACT inhaler Inhale 2 puffs into the lungs every 4 (four) hours as needed for wheezing or shortness of breath. 07/07/19  Yes Boscia, Greer Ee, NP  ALPRAZolam (XANAX) 0.25 MG tablet Take 1 tablet (0.25 mg total) by mouth 2 (two) times daily. 07/07/19  Yes Boscia, Greer Ee, NP  apixaban (ELIQUIS) 5 MG TABS tablet Take 1 tablet (5 mg total) by mouth 2 (two) times daily. 06/16/19  Yes Wellington Hampshire, MD  dexlansoprazole (DEXILANT) 60 MG capsule Take 1 capsule (60 mg total) by mouth daily. 08/02/19  Yes Boscia, Heather E, NP  diltiazem (CARDIZEM CD) 360 MG 24 hr capsule TAKE 1 CAPSULE (360 MG TOTAL) BY MOUTH DAILY. 05/17/19  Yes Wellington Hampshire, MD  EPINEPHrine 0.3 mg/0.3 mL IJ SOAJ injection Inject 0.3 mg into the muscle as needed for anaphylaxis.    Yes [provider]  furosemide (LASIX) 40 MG tablet Take 1 tablet (40 mg total) by mouth daily. 04/21/19  Yes Boscia, Greer Ee, NP  guaiFENesin (MUCINEX) 600 MG 12 hr tablet Take 600 mg by mouth 2 (two) times daily as needed for cough or to loosen phlegm.    Yes [provider]  ibuprofen (ADVIL,MOTRIN) 200 MG tablet Take 400-800 mg by mouth every 6 (six) hours as needed for fever or mild pain.  Yes [provider]  ipratropium-albuterol (DUONEB) 0.5-2.5 (3) MG/3ML SOLN Take 3 mLs by nebulization every 4 (four) hours as needed. Patient taking differently: Take 3 mLs by nebulization every 4 (four) hours as needed (shortness of breath / wheezing).  02/03/19  Yes Boscia, Greer Ee, NP  levalbuterol (XOPENEX) 0.31 MG/3ML nebulizer solution Take 3 mLs by nebulization every 4 (four) hours as needed. For wheezing/ and or shortness of breath. 02/05/16  Yes [provider]  losartan (COZAAR) 25 MG tablet Take 1 tablet (25 mg total) by mouth daily. 07/27/19   Yes Boscia, Heather E, NP  montelukast (SINGULAIR) 10 MG tablet TAKE 1 TABLET BY MOUTH EVERY DAY Patient taking differently: Take 10 mg by mouth at bedtime.  04/23/19  Yes Ronnell Freshwater, NP  Multiple Vitamin (MULTIVITAMIN) tablet Take 1 tablet by mouth daily.   Yes [provider]  nystatin (MYCOSTATIN) 100000 UNIT/ML suspension Take 5 mLs (500,000 Units total) by mouth 4 (four) times daily. Patient taking differently: Take 5 mLs by mouth 4 (four) times daily as needed.  04/19/19  Yes Boscia, Greer Ee, NP  ondansetron (ZOFRAN-ODT) 8 MG disintegrating tablet Take 1 tablet (8 mg total) by mouth every 8 (eight) hours as needed for nausea or vomiting. 02/03/19  Yes Boscia, Heather E, NP  predniSONE (DELTASONE) 10 MG tablet Take 1 tablet (10 mg total) by mouth daily with breakfast. 04/19/19  Yes Ronnell Freshwater, NP  Probiotic Product (PROBIOTIC-10) CAPS Take 1 capsule by mouth daily.    Yes [provider]  spironolactone (ALDACTONE) 25 MG tablet Take 1 tablet (25 mg total) by mouth 2 (two) times daily. 07/27/19  Yes Ronnell Freshwater, NP  theophylline (THEO-24) 300 MG 24 hr capsule Take 1 capsule (300 mg total) by mouth daily. 08/23/19  Yes Scarboro, Audie Clear, NP  pantoprazole (PROTONIX) 40 MG tablet Take 1 tablet (40 mg total) by mouth daily. Patient not taking: Reported on 09/11/2019 09/06/19   Ronnell Freshwater, NP  phentermine (ADIPEX-P) 37.5 MG tablet Take 1 tablet (37.5 mg total) by mouth daily before breakfast. Patient not taking: Reported on 09/11/2019 07/07/19   Ronnell Freshwater, NP     ALLERGIES:  Allergies  Allergen Reactions  . Flecainide Shortness Of Breath  . Propafenone Shortness Of Breath and Swelling  . Rivaroxaban Other (See Comments)     SOCIAL HISTORY:  Social History   Socioeconomic History  . Marital status: Married    Spouse name: Not on file  . Number of children: Not on file  . Years of education: Not on file  . Highest education level: Not on file   Occupational History  . Not on file  Social Needs  . Financial resource strain: Not on file  . Food insecurity    Worry: Not on file    Inability: Not on file  . Transportation needs    Medical: Not on file    Non-medical: Not on file  Tobacco Use  . Smoking status: Never Smoker  . Smokeless tobacco: Never Used  Substance and Sexual Activity  . Alcohol use: No  . Drug use: No  . Sexual activity: Not on file  Lifestyle  . Physical activity    Days per week: Not on file    Minutes per session: Not on file  . Stress: Not on file  Relationships  . Social Herbalist on phone: Not on file    Gets together: Not on file  Attends religious service: Not on file    Active member of club or organization: Not on file    Attends meetings of clubs or organizations: Not on file    Relationship status: Not on file  . Intimate partner violence    Fear of current or ex partner: Not on file    Emotionally abused: Not on file    Physically abused: Not on file    Forced sexual activity: Not on file  Other Topics Concern  . Not on file  Social History Narrative  . Not on file    The patient currently resides (home / rehab facility / nursing home): Home The patient normally is (ambulatory / bedbound): Ambulatory   FAMILY HISTORY:  Family History  Problem Relation Age of Onset  . Dementia Mother   . Osteoporosis Mother   . Vascular Disease Mother   . COPD Father      REVIEW OF SYSTEMS:  Constitutional: denies weight loss, fever, chills, or sweats  Eyes: denies any other vision changes, history of eye injury  ENT: denies sore throat, hearing problems  Respiratory: positive shortness of breath, wheezing  Cardiovascular: denies chest pain, positive palpitations  Gastrointestinal:  Positive abdominal pain and nausea. Negative for vomiting. Negative for rectal bleeding.  Genitourinary: denies burning with urination or urinary frequency Musculoskeletal: denies any other  joint pains or cramps  Skin: denies any other rashes or skin discolorations  Neurological: denies any other headache, dizziness, weakness  Psychiatric: denies any other depression, anxiety   All other review of systems were negative   VITAL SIGNS:  Temp:  [99 F (37.2 C)] 99 F (37.2 C) (09/20 1038) Pulse Rate:  [37-141] 105 (09/20 1537) Resp:  [17-30] 25 (09/20 1537) BP: (91-144)/(58-117) 117/61 (09/20 1537) SpO2:  [92 %-100 %] 100 % (09/20 1537) Weight:  [id number] kg] 127 kg (09/20 1040)     Height: 5' 4.5" (163.8 cm) Weight: 127 kg BMI (Calculated): 47.34   INTAKE/OUTPUT:  This shift: No intake/output data recorded.  Last 2 shifts: @IOLAST2SHIFTS @   PHYSICAL EXAM:  Constitutional:  -- Distress due to abdominal pain -- Awake, alert, and oriented x3  Eyes:  -- Pupils equally round and reactive to light  -- No scleral icterus  Ear, nose, and throat:  -- No jugular venous distension  Pulmonary:  -- No crackles  -- Equal breath sounds bilaterally, decreased -- Breathing mildly-labored at rest Cardiovascular:  -- S1, S2 present  -- No pericardial rubs --Irregular rythm Gastrointestinal:  -- Abdomen soft, tender in lower quadrants, non-distended, no guarding or rebound tenderness -- No abdominal masses appreciated, pulsatile or otherwise  Musculoskeletal and Integumentary:  -- Wounds or skin discoloration: None appreciated -- Extremities: B/L UE and LE FROM, hands and feet warm, no edema  Neurologic:  -- Motor function: intact and symmetric -- Sensation: intact and symmetric   Labs:  CBC Latest Ref Rng & Units 09/11/2019 01/28/2019 09/10/2018  WBC 4.0 - 10.5 K/uL 11.8(H) 12.7(H) 7.7  Hemoglobin 12.0 - 15.0 g/dL 12.8 13.2 13.8  Hematocrit 36.0 - 46.0 % 39.3 38.6 41.3  Platelets 150 - 400 K/uL 296 350 310   CMP Latest Ref Rng & Units 09/11/2019 01/28/2019 09/10/2018  Glucose 70 - 99 mg/dL 108(H) 75 88  BUN 8 - 23 mg/dL 16 30(H) 22  Creatinine 0.44 - 1.00 mg/dL 0.93 1.15(H)  1.01(H)  Sodium 135 - 145 mmol/L 135 136 140  Potassium 3.5 - 5.1 mmol/L 3.9 5.0 5.0  Chloride 98 -  111 mmol/L 101 101 100  CO2 22 - 32 mmol/L 26 21 25   Calcium 8.9 - 10.3 mg/dL 8.3(L) 9.5 9.4  Total Protein 6.5 - 8.1 g/dL 6.0(L) - 6.4  Total Bilirubin 0.3 - 1.2 mg/dL 1.0 - 0.6  Alkaline Phos 38 - 126 U/L 56 - 59  AST 15 - 41 U/L 24 - 15  ALT 0 - 44 U/L 18 - 15    Imaging studies:  EXAM: CTA ABDOMEN AND PELVIS WITH CONTRAST  TECHNIQUE: Multidetector CT imaging of the abdomen and pelvis was performed using the standard protocol during bolus administration of intravenous contrast. Multiplanar reconstructed images and MIPs were obtained and reviewed to evaluate the vascular anatomy.  CONTRAST:  128mL OMNIPAQUE IOHEXOL 350 MG/ML SOLN  COMPARISON:  Scan from earlier the same day  FINDINGS: VASCULAR  Aorta: Moderate calcified atheromatous plaque throughout. No aneurysm, dissection, or stenosis.  Celiac: Calcified ostial plaque resulting in short segment stenosis of at least mild severity. Patent distally.  SMA: Calcified ostial plaque resulting in short segment stenosis of at least mild severity. Widely patent distally. Classic distal branch anatomy.  Renals: Single left, widely patent.  Single right, with ostial plaque resulting in only mild stenosis, widely patent distally.  IMA: Patent, diminutive.  Inflow: Calcified nonocclusive plaque through bilateral common iliac arteries. Internal and external iliac arteries unremarkable.  Proximal Outflow: Bilateral common femoral and visualized portions of the superficial and profunda femoral arteries are patent without evidence of aneurysm, dissection, vasculitis or significant stenosis.  Veins: Patent portal vein, SMV, splenic vein, bilateral renal veins. No venous pathology is evident. Delayed venous phase was demonstrated on the previous study.  Review of the MIP images confirms the above  findings.  NON-VASCULAR  Lower chest: Linear scarring or subsegmental atelectasis in the visualized lung bases. No pleural or pericardial effusion.  Hepatobiliary: Stable low-attenuation right lobe lesions, statistically most likely cysts in the absence of a history of primary carcinoma, but incompletely characterized. Gallbladder physiologically distended. No biliary ductal dilatation.  Pancreas: Unremarkable. No pancreatic ductal dilatation or surrounding inflammatory changes.  Spleen: Normal in size without focal abnormality.  Adrenals/Urinary Tract: Normal adrenals.  Stomach/Bowel: Stomach is decompressed. Small bowel is nondilated. Persistent segment of circumferential wall thickening in the distal ileum over length of at least 30 cm extending nearly to the terminal ileum. Inflammatory/edematous changes in the adjacent mesentery with small regional pockets of interloop ascites without evidence of loculation or peripheral enhancement. Ileocecal valve unremarkable. Appendix not well seen. The colon is nondilated with scattered diverticula from distal descending and sigmoid segment.  Lymphatic: No abdominal or pelvic adenopathy.  Reproductive: Uterus and bilateral adnexa are unremarkable.  Other: Small volume right lower quadrant pelvic ascites as above. No free air.  Musculoskeletal: Multilevel spondylitic changes in the lumbar spine. No fracture or worrisome bone lesion.  IMPRESSION: 1. 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 If there is continued clinical concern of ischemic bowel, arteriogram with pressure measurements across the stenoses may be useful to better define their hemodynamic significance, as they may be approachable for percutaneous intervention if indicated. 2. Persistent circumferential wall thickening in the distal ileum with regional inflammatory/edematous changes and  mesenteric fluid; no abscess. Inflammatory bowel disease, infectious/inflammatory enteritis, or ischemia could give this appearance. 3. Descending and sigmoid diverticulosis.   Electronically Signed   By: Lucrezia Europe M.D.   On: 09/11/2019 15:58  Assessment/Plan:  68 y.o. female with acute enteritis (ischemic  vs infectuous), complicated by pertinent comorbidities including atria fibrillation on anticoagulation, Pulmonary hypertension, COPD, sleep apnea, morbid obesity, hypertension.  Very complicated patient due to critical comorbidities. High suspicious of ischemic terminal ileum. I discuss with the patient the possible alternatives if pain does not improves in the next few hours including diagnostic laparoscopy. Vascular surgery was consulted for additional input and recommendations regarding if possible angiogram with revascularization. I consider that even with revascularization if the pain continue at this level patient will need at least a diagnostic laparoscopy for visual evaluation of the distal ileum. Patient very high risk for surgery due to cardiac and pulmonary complications. Will follow closely the recommendations of vascular surgery for further dispositions.    Arnold Long, MD

## 2019-09-11 NOTE — Progress Notes (Signed)
Received pt from ED; on Cardizem drip at 12.5 mg/hr.Afib on tele at 104-116. Appears tachypneic, admits to SOB, but states it is improving; Does have few exp wheezes. O2 sat good at 98 on 2L C/O abdomen is severe pain, 10/10. Discussed with Dr. Stark Jock, will give Dilaudid 1 mg IV for c/o abd pain. NS infusion initiated at 100 ml/hr. Reminded of NPO status. Pt has severely dry lips and mouth. Given mouthwash to spit and also mouth moisturizer. Cardiology and Vasc surgery in to see pt.

## 2019-09-11 NOTE — Consult Note (Signed)
Pharmacy Antibiotic Note  Laura [name] is a 68 y.o. female admitted on 09/11/2019 with Colitis.  Pharmacy has been consulted for Zosyn dosing.  Plan: Zosyn 3.375 g Q8H (extended infusion)  Height: 5' 4.5" (163.8 cm) Weight: 280 lb (127 kg) IBW/kg (Calculated) : 55.85  Temp (24hrs), Avg:99 F (37.2 C), Min:98.9 F (37.2 C), Max:99 F (37.2 C)  Recent Labs  Lab 09/11/19 1041 09/11/19 1318  WBC 11.8*  --   CREATININE 0.93  --   LATICACIDVEN  --  1.1    Estimated Creatinine Clearance: 76 mL/min (by C-G formula based on SCr of 0.93 mg/dL).    Allergies  Allergen Reactions  . Flecainide Shortness Of Breath  . Propafenone Shortness Of Breath and Swelling  . Rivaroxaban Other (See Comments)    Thank you for allowing pharmacy to be a part of this patient's care.  Rowland Lathe 09/11/2019 4:59 PM

## 2019-09-11 NOTE — ED Notes (Addendum)
CT notified patient ready for CT. MD aware of patient continued c/o feeling nauseated at this time, MD aware pt continues to be in A-FIB RVR after admin of diltiazem.

## 2019-09-11 NOTE — Consult Note (Signed)
Reason for Consult: Concern of ischemia of the bowel Referring Physician: Dr. [name] is an 68 y.o. female.  HPI: Patient with a history of chronic A. fib on Eliquis reportedly compliant, history of hypertension and obesity.  Patient states that she fell and subsequently developed lower abdominal pain yesterday.  She denied chest pain, syncope or palpitations at that time.  She states that she has had progressive nausea and vomiting with decreased p.o. intake over the last day or so.  She states that the lower abdominal pain progressed therefore she presented to the ER.  Upon arrival it was noted she was in rapid A. fib with RVR.  We are able to get rate control IV Cardizem.  Plain CT of the abdomen revealed a concern for small bowel enteritis versus ischemic etiology therefore Vascular Surgery was consulted.  Past Medical History:  Diagnosis Date  . Asthma   . Chronic atrial fibrillation    a. diagnosed in 09/2016; b. failed flecainide and propafenone due to LE swelling and SOB, could not afford Multaq; c. CHADS2VASc => 3 (HTN, age x 1, female); d. on Eliquis  . GERD (gastroesophageal reflux disease)   . History of stress test    a. Lexiscan Myoview 10/2016: no evidence of ischemia, EF 53%  . Hypertension   . Obesity   . Obstructive sleep apnea   . Pulmonary hypertension ([place])   . Systolic dysfunction    a. TTE 10/2016: EF 50%, mild LVH, moderately dilated LA, moderate MR/TR, mild pulmonary hypertension    Past Surgical History:  Procedure Laterality Date  . cataract surgery    . INCISION AND DRAINAGE ABSCESS Right 06/29/2016   Procedure: INCISION AND DRAINAGE ABSCESS;  Surgeon: Florene Glen, MD;  Location: [facility] ORS;  Service: General;  Laterality: Right;  . INCISION AND DRAINAGE OF WOUND Left 06/29/2016   Procedure: IRRIGATION AND DEBRIDEMENT WOUND;  Surgeon: Florene Glen, MD;  Location: [facility] ORS;  Service: General;  Laterality: Left;    Family History  Problem  Relation Age of Onset  . Dementia Mother   . Osteoporosis Mother   . Vascular Disease Mother   . COPD Father     Social History:  reports that she has never smoked. She has never used smokeless tobacco. She reports that she does not drink alcohol or use drugs.  Allergies:  Allergies  Allergen Reactions  . Flecainide Shortness Of Breath  . Propafenone Shortness Of Breath and Swelling  . Rivaroxaban Other (See Comments)    Medications: I have reviewed the patient's current medications.  Results for orders placed or performed during the hospital encounter of 09/11/19 (from the past 48 hour(s))  CBC with Differential     Status: Abnormal   Collection Time: 09/11/19 10:41 AM  Result Value Ref Range   WBC 11.8 (H) 4.0 - 10.5 K/uL   RBC 4.22 3.87 - 5.11 MIL/uL   Hemoglobin 12.8 12.0 - 15.0 g/dL   HCT 39.3 36.0 - 46.0 %   MCV 93.1 80.0 - 100.0 fL   MCH 30.3 26.0 - 34.0 pg   MCHC 32.6 30.0 - 36.0 g/dL   RDW 13.8 11.5 - 15.5 %   Platelets 296 150 - 400 K/uL   nRBC 0.0 0.0 - 0.2 %   Neutrophils Relative % 79 %   Neutro Abs 9.4 (H) 1.7 - 7.7 K/uL   Lymphocytes Relative 10 %   Lymphs Abs 1.2 0.7 - 4.0 K/uL   Monocytes Relative  9 %   Monocytes Absolute 1.0 0.1 - 1.0 K/uL   Eosinophils Relative 1 %   Eosinophils Absolute 0.1 0.0 - 0.5 K/uL   Basophils Relative 0 %   Basophils Absolute 0.0 0.0 - 0.1 K/uL   Immature Granulocytes 1 %   Abs Immature Granulocytes 0.06 0.00 - 0.07 K/uL    Comment: Performed at [facility], [address] [place], [place] [address]  Comprehensive metabolic panel     Status: Abnormal   Collection Time: 09/11/19 10:41 AM  Result Value Ref Range   Sodium 135 135 - 145 mmol/L   Potassium 3.9 3.5 - 5.1 mmol/L    Comment: HEMOLYSIS AT THIS LEVEL MAY AFFECT RESULT   Chloride 101 98 - 111 mmol/L   CO2 26 22 - 32 mmol/L   Glucose, Bld 108 (H) 70 - 99 mg/dL   BUN 16 8 - 23 mg/dL   Creatinine, Ser 0.93 0.44 - 1.00 mg/dL   Calcium 8.3 (L) 8.9 -  10.3 mg/dL   Total Protein 6.0 (L) 6.5 - 8.1 g/dL   Albumin 3.3 (L) 3.5 - 5.0 g/dL   AST 24 15 - 41 U/L   ALT 18 0 - 44 U/L   Alkaline Phosphatase 56 38 - 126 U/L   Total Bilirubin 1.0 0.3 - 1.2 mg/dL   GFR calc non Af Amer >60 >60 mL/min   GFR calc Af Amer >60 >60 mL/min   Anion gap 8 5 - 15    Comment: Performed at [facility], [place], [place] [address]  Troponin I (High Sensitivity)     Status: None   Collection Time: 09/11/19 10:41 AM  Result Value Ref Range   Troponin I (High Sensitivity) 9 <18 ng/L    Comment: (NOTE) Elevated high sensitivity troponin I (hsTnI) values and significant  changes across serial measurements may suggest ACS but many other  chronic and acute conditions are known to elevate hsTnI results.  Refer to the "Links" section for chest pain algorithms and additional  guidance. Performed at [facility], [address] [place], [place] [address]   SARS Coronavirus 2 [facility] order, Performed in [facility] hospital lab) Nasopharyngeal Nasopharyngeal Swab     Status: None   Collection Time: 09/11/19 12:42 PM   Specimen: Nasopharyngeal Swab  Result Value Ref Range   SARS Coronavirus 2 NEGATIVE NEGATIVE    Comment: (NOTE) If result is NEGATIVE SARS-CoV-2 target nucleic acids are NOT DETECTED. The SARS-CoV-2 RNA is generally detectable in upper and lower  respiratory specimens during the acute phase of infection. The lowest  concentration of SARS-CoV-2 viral copies this assay can detect is 250  copies / mL. A negative result does not preclude SARS-CoV-2 infection  and should not be used as the sole basis for treatment or other  patient management decisions.  A negative result may occur with  improper specimen collection / handling, submission of specimen other  than nasopharyngeal swab, presence of viral mutation(s) within the  areas targeted by this assay, and inadequate number of viral copies  (<250 copies / mL). A  negative result must be combined with clinical  observations, patient history, and epidemiological information. If result is POSITIVE SARS-CoV-2 target nucleic acids are DETECTED. The SARS-CoV-2 RNA is generally detectable in upper and lower  respiratory specimens dur ing the acute phase of infection.  Positive  results are indicative of active infection with SARS-CoV-2.  Clinical  correlation with patient history and other  diagnostic information is  necessary to determine patient infection status.  Positive results do  not rule out bacterial infection or co-infection with other viruses. If result is PRESUMPTIVE POSTIVE SARS-CoV-2 nucleic acids MAY BE PRESENT.   A presumptive positive result was obtained on the submitted specimen  and confirmed on repeat testing.  While 2019 novel coronavirus  (SARS-CoV-2) nucleic acids may be present in the submitted sample  additional confirmatory testing may be necessary for epidemiological  and / or clinical management purposes  to differentiate between  SARS-CoV-2 and other Sarbecovirus currently known to infect humans.  If clinically indicated additional testing with an alternate test  methodology [phone]) is advised. The SARS-CoV-2 RNA is generally  detectable in upper and lower respiratory sp ecimens during the acute  phase of infection. The expected result is Negative. Fact Sheet for Patients:  StrictlyIdeas.no Fact Sheet for Healthcare Providers: BankingDealers.co.za This test is not yet approved or cleared by the [place] FDA and has been authorized for detection and/or diagnosis of SARS-CoV-2 by FDA under an Emergency Use Authorization (EUA).  This EUA will remain in effect (meaning this test can be used) for the duration of the COVID-19 declaration under Section 564(b)(1) of the Act, 21 U.S.C. section 360bbb-3(b)(1), unless the authorization is terminated or revoked sooner. Performed  at [facility], [address] [place], [place] [address]   Lactic acid, plasma     Status: None   Collection Time: 09/11/19  1:18 PM  Result Value Ref Range   Lactic Acid, Venous 1.1 0.5 - 1.9 mmol/L    Comment: Performed at [facility], [place], [place] [address]    Ct Head Wo Contrast  Result Date: 09/11/2019 CLINICAL DATA:  Head trauma, patient on anticoagulation EXAM: CT HEAD WITHOUT CONTRAST TECHNIQUE: Contiguous axial images were obtained from the base of the skull through the vertex without intravenous contrast. COMPARISON:  09/13/2013 FINDINGS: Brain: No evidence of acute infarction, hemorrhage, extra-axial collection, ventriculomegaly, or mass effect. Vascular: Cerebrovascular atherosclerotic calcifications are noted. Skull: Negative for fracture or focal lesion. Sinuses/Orbits: Visualized portions of the orbits are unremarkable. Visualized portions of the paranasal sinuses and mastoid air cells are unremarkable. Other: None. IMPRESSION: No acute intracranial pathology. Electronically Signed   By: Kathreen Devoid   On: 09/11/2019 12:51   Ct Abdomen Pelvis W Contrast  Result Date: 09/11/2019 CLINICAL DATA:  Blunt abdominal trauma. Status post fall yesterday. Now with nausea, vomiting and body aches. EXAM: CT ABDOMEN AND PELVIS WITH CONTRAST TECHNIQUE: Multidetector CT imaging of the abdomen and pelvis was performed using the standard protocol following bolus administration of intravenous contrast. CONTRAST:  167mL OMNIPAQUE IOHEXOL 300 MG/ML  SOLN COMPARISON:  None. FINDINGS: Lower chest: No acute abnormality. Hepatobiliary: Small hypodense foci within the RIGHT liver lobe, too small to definitively characterize but most suggestive of benign cysts. No acute or suspicious findings within the liver. Gallbladder appears normal. No bile duct dilatation. Pancreas: Unremarkable. No pancreatic ductal dilatation or surrounding inflammatory changes. Spleen: Normal  in size without focal abnormality. Adrenals/Urinary Tract: Adrenal glands appear normal. Kidneys appear normal without mass, stone or hydronephrosis. No ureteral or bladder calculi identified. Bladder is decompressed. Stomach/Bowel: Extensive diverticulosis of the LEFT colon but no focal inflammatory change to suggest acute diverticulitis. Edematous thickening of the distal small bowel (ileum) with associated fluid stranding/inflammation in the surrounding mesentery. No pneumatosis intestinalis appreciated. More proximal small bowel is normal in caliber. No other site of bowel wall thickening/inflammation. Stomach is  unremarkable, partially decompressed. Appendix is normal. Vascular/Lymphatic: Aortic atherosclerosis. No acute appearing vascular abnormality. No enlarged lymph nodes seen. Reproductive: Uterus and bilateral adnexa are unremarkable. Other: Small amount of free fluid in the lower abdomen and pelvis. No circumscribed collection or abscess like collection seen. No free intraperitoneal air. Musculoskeletal: No acute or suspicious osseous finding. Degenerative spondylosis of the thoracolumbar spine, mild to moderate in degree. IMPRESSION: 1. Pronounced edematous thickening of the walls of the distal small bowel (ileum) with associated fluid stranding/inflammation in the surrounding mesentery. Findings are consistent with a focal small bowel enteritis of infectious, inflammatory or ischemic nature. Given patient's diffuse aortic atherosclerosis, I FAVOR ISCHEMIC BOWEL. Next most likely differential would be a focal severe infectious enteritis. Given patient's age, Crohn's disease is considered less likely. 2. Small amount of free fluid in the lower abdomen and pelvis. No circumscribed collection or abscess like collection seen. No free intraperitoneal air. 3. Extensive diverticulosis of the LEFT colon without evidence of acute diverticulitis. 4. Aortic atherosclerosis. Aortic Atherosclerosis (ICD10-I70.0).  Electronically Signed   By: Franki Cabot M.D.   On: 09/11/2019 12:54   Dg Chest Port 1 View  Result Date: 09/11/2019 CLINICAL DATA:  Fall, abdominal pain, tachycardia EXAM: PORTABLE CHEST 1 VIEW COMPARISON:  CTA chest dated 06/29/2016 FINDINGS: Lungs are clear.  No pleural effusion or pneumothorax. The heart is top-normal in size.  Thoracic aortic atherosclerosis. IMPRESSION: No evidence of acute cardiopulmonary disease. Thoracic aortic atherosclerosis. Electronically Signed   By: Julian Hy M.D.   On: 09/11/2019 11:45   Ct Angio Abd/pel W And/or Wo Contrast  Result Date: 09/11/2019 CLINICAL DATA:  Possible ischemic bowel EXAM: CTA ABDOMEN AND PELVIS WITH CONTRAST TECHNIQUE: Multidetector CT imaging of the abdomen and pelvis was performed using the standard protocol during bolus administration of intravenous contrast. Multiplanar reconstructed images and MIPs were obtained and reviewed to evaluate the vascular anatomy. CONTRAST:  142mL OMNIPAQUE IOHEXOL 350 MG/ML SOLN COMPARISON:  Scan from earlier the same day FINDINGS: VASCULAR Aorta: Moderate calcified atheromatous plaque throughout. No aneurysm, dissection, or stenosis. Celiac: Calcified ostial plaque resulting in short segment stenosis of at least mild severity. Patent distally. SMA: Calcified ostial plaque resulting in short segment stenosis of at least mild severity. Widely patent distally. Classic distal branch anatomy. Renals: Single left, widely patent. Single right, with ostial plaque resulting in only mild stenosis, widely patent distally. IMA: Patent, diminutive. Inflow: Calcified nonocclusive plaque through bilateral common iliac arteries. Internal and external iliac arteries unremarkable. Proximal Outflow: Bilateral common femoral and visualized portions of the superficial and profunda femoral arteries are patent without evidence of aneurysm, dissection, vasculitis or significant stenosis. Veins: Patent portal vein, SMV, splenic vein,  bilateral renal veins. No venous pathology is evident. Delayed venous phase was demonstrated on the previous study. Review of the MIP images confirms the above findings. NON-VASCULAR Lower chest: Linear scarring or subsegmental atelectasis in the visualized lung bases. No pleural or pericardial effusion. Hepatobiliary: Stable low-attenuation right lobe lesions, statistically most likely cysts in the absence of a history of primary carcinoma, but incompletely characterized. Gallbladder physiologically distended. No biliary ductal dilatation. Pancreas: Unremarkable. No pancreatic ductal dilatation or surrounding inflammatory changes. Spleen: Normal in size without focal abnormality. Adrenals/Urinary Tract: Normal adrenals. Stomach/Bowel: Stomach is decompressed. Small bowel is nondilated. Persistent segment of circumferential wall thickening in the distal ileum over length of at least 30 cm extending nearly to the terminal ileum. Inflammatory/edematous changes in the adjacent mesentery with small regional pockets of interloop ascites without  evidence of loculation or peripheral enhancement. Ileocecal valve unremarkable. Appendix not well seen. The colon is nondilated with scattered diverticula from distal descending and sigmoid segment. Lymphatic: No abdominal or pelvic adenopathy. Reproductive: Uterus and bilateral adnexa are unremarkable. Other: Small volume right lower quadrant pelvic ascites as above. No free air. Musculoskeletal: Multilevel spondylitic changes in the lumbar spine. No fracture or worrisome bone lesion. IMPRESSION: 1. 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 If there is continued clinical concern of ischemic bowel, arteriogram with pressure measurements across the stenoses may be useful to better define their hemodynamic significance, as they may be approachable for percutaneous intervention if indicated. 2. Persistent  circumferential wall thickening in the distal ileum with regional inflammatory/edematous changes and mesenteric fluid; no abscess. Inflammatory bowel disease, infectious/inflammatory enteritis, or ischemia could give this appearance. 3. Descending and sigmoid diverticulosis. Electronically Signed   By: Lucrezia Europe M.D.   On: 09/11/2019 15:58    Review of Systems  Constitutional: Positive for diaphoresis and malaise/fatigue.  HENT: Negative.   Eyes: Negative.   Respiratory: Positive for shortness of breath.   Cardiovascular: Positive for chest pain, orthopnea and leg swelling.  Gastrointestinal: Positive for abdominal pain, nausea and vomiting.  Genitourinary: Negative.   Musculoskeletal: Negative.   Skin: Negative.   Neurological: Negative for dizziness and focal weakness.  Psychiatric/Behavioral: Negative.    Blood pressure 111/71, pulse (!) 115, temperature 98.9 F (37.2 C), temperature source Oral, resp. rate 19, height 5' 4.5" (1.638 m), weight 127 kg, SpO2 98 %. Physical Exam  Nursing note and vitals reviewed. Constitutional: She is oriented to person, place, and time. She appears well-developed and well-nourished.  HENT:  Head: Normocephalic.  Neck: Normal range of motion. Neck supple. No JVD present.  Cardiovascular: Intact distal pulses.  Irregular irregular A. fib  Respiratory: Effort normal. No stridor. She has wheezes.  GI: Soft. Bowel sounds are normal. She exhibits no distension. There is abdominal tenderness. There is no rebound and no guarding.  Tenderness to palpation bilateral lower quadrants right greater than left no rebound no guarding  Musculoskeletal: Normal range of motion.        General: Edema present. No tenderness.     Comments: Right lower extremity with pitting edema venous stasis changes over the lower extremities.  Neurological: She is alert and oriented to person, place, and time.  Skin: Skin is warm.    Assessment/Plan:  Small bowel enteritis  versus ischemic etiology-likely low-flow state to the bowel secondary to dehydration and rapid A. fib with RVR.  I did review the CTA myself and there is only mild aortic atherosclerosis without indication of severe pathology. The CTA did reveal some narrowing at the celiac and SMA origins with widely patent vasculature distal to this throughout the mesentery..  The IMA was diminutive.  These findings are chronic in nature and there was no evidence of thrombus within the mesenteric vessels to indicate acute ischemia.  In addition based on the size of the celiac and SMA and the diminutive size of the IMA have this was a chronic picture of her mesenteric vessels.   A smaller thromboembolic event or the actual percentage of stenosis of the origins cannot be 100% clarified from a the CTA obtained.  A primary vascular etiology is less likely.  The patient had a cardiac event with rapid A. fib and RVR and dehydration which may have caused a low flow state to the bowel.  Nonetheless  I would recommend: 1: general surgery evaluation for enteritis,  continued serial exams, serial labs-repeat Lactate, WBC 2: Cardiology evaluation for rate control 3: Heparin drip 4: IV fluid resuscitation 5: Broad-spectrum antibiotics to cover enteric flora  We will follow closely.  If there is no improvement in her exams within the next 24 hours we will consider a mesenteric angiogram.  Laura [name] A 09/11/2019, 4:43 PM

## 2019-09-11 NOTE — ED Notes (Signed)
Pt tolerated admin of diltiazem well, however pt c/o continued feeling sick, pt noted to be burping.  After admin of diltiazem BP 111/58, HR 135-143.

## 2019-09-11 NOTE — Progress Notes (Signed)
Dr. Peyton Najjar in to see patient; spoke to pt regarding plan for OR tonight. Heparin discontinued. Husband at bedside. Afib HR 80's-105. BGP stable. Pt remains NPO.

## 2019-09-11 NOTE — Anesthesia Preprocedure Evaluation (Signed)
Anesthesia Evaluation  Patient identified by MRN, date of birth, ID band Patient awake    Reviewed: Allergy & Precautions, NPO status , Patient's Chart, lab work & pertinent test results  History of Anesthesia Complications Negative for: history of anesthetic complications  Airway Mallampati: III       Dental  (+) Partial Lower, Partial Upper   Pulmonary asthma , sleep apnea and Continuous Positive Airway Pressure Ventilation , COPD,  COPD inhaler, Not current smoker,           Cardiovascular hypertension, Pt. on medications (-) Past MI and (-) CHF + dysrhythmias Atrial Fibrillation (-) Valvular Problems/Murmurs     Neuro/Psych neg Seizures Anxiety    GI/Hepatic Neg liver ROS, GERD  Medicated and Controlled,  Endo/Other  neg diabetes  Renal/GU negative Renal ROS     Musculoskeletal   Abdominal   Peds  Hematology   Anesthesia Other Findings   Reproductive/Obstetrics                             Anesthesia Physical Anesthesia Plan  ASA: III and emergent  Anesthesia Plan: General   Post-op Pain Management:    Induction: Intravenous  PONV Risk Score and Plan: 3 and Dexamethasone, Ondansetron and Midazolam  Airway Management Planned: Oral ETT  Additional Equipment:   Intra-op Plan:   Post-operative Plan: Possible Post-op intubation/ventilation  Informed Consent: I have reviewed the patients History and Physical, chart, labs and discussed the procedure including the risks, benefits and alternatives for the proposed anesthesia with the patient or authorized representative who has indicated his/her understanding and acceptance.       Plan Discussed with:   Anesthesia Plan Comments: (Pt at high risk from her comorbidities. Pt and surgeon aware and desire to proceed. )        Anesthesia Quick Evaluation

## 2019-09-11 NOTE — ED Notes (Addendum)
Pt transported to CT with this RN, pt returned from [facility] with this RN.   Pt placed on 2L via [place] during CT due to patient having mild SHOB, states at baseline wears O2 at home when she goes to bed and lays down.

## 2019-09-11 NOTE — Progress Notes (Signed)
Call to room bu assessed pt  sat 97% hr 112 Bpm RR 20 to 22 , Bbs diminished , spoke with pt and family member , pt stated that  she anxious about  The procedure , ,left a high flow O2 device at the bed side , notified the RN

## 2019-09-11 NOTE — H&P (Addendum)
[facility] NAME: Laura [name]    MR#:  [id number]  DATE OF BIRTH:  01-29-1951  DATE OF ADMISSION:  09/11/2019  PRIMARY CARE PHYSICIAN: [name], MD   REQUESTING/REFERRING PHYSICIAN: Lavonia Drafts  CHIEF COMPLAINT:   Chief Complaint  Patient presents with   Abdominal Pain   Fall   Tachycardia    HISTORY OF PRESENT ILLNESS:  Laura [name]  is a 68 y.o. female with a known history of chronic atrial fibrillation on anticoagulation with Eliquis, hypertension, pulmonary hypertension, obstructive sleep apnea, COPD and peripheral artery disease who presented to the emergency room with complaints of abdominal pains, nausea, vomiting and diarrhea that started yesterday after patient had an episode of fall.  Patient denies any fevers.  Patient denied having any injuries.  Was complaining of lower abdominal pain right side greater than left on presentation the emergency room.  Was also found to be in atrial fibrillation with rapid ventricular response with heart rate in the 140s..  Was given IV Cardizem and subsequently started on Cardizem drip for rate control. Patient evaluated in the emergency room with lactic acid level normal.  However CT scan of abdomen and pelvis done with contrast revealed findings consistent with focal small bowel enteritis of infectious or ischemic nature.  Radiologist favors ischemic bowel.  Emergency room physician already discussed case with vascular surgeon on-call Dr.Esco CT angiogram of the abdomen already ordered.  CT scan of the head was with no acute findings.  Medical service called to admit patient for further evaluation and management.  PAST MEDICAL HISTORY:   Past Medical History:  Diagnosis Date   Asthma    Chronic atrial fibrillation    a. diagnosed in 09/2016; b. failed flecainide and propafenone due to LE swelling and SOB, could not afford Multaq; c. CHADS2VASc => 3 (HTN, age x 1,  female); d. on Eliquis   GERD (gastroesophageal reflux disease)    History of stress test    a. Lexiscan Myoview 10/2016: no evidence of ischemia, EF 53%   Hypertension    Obesity    Obstructive sleep apnea    Pulmonary hypertension (HCC)    Systolic dysfunction    a. TTE 10/2016: EF 50%, mild LVH, moderately dilated LA, moderate MR/TR, mild pulmonary hypertension    PAST SURGICAL HISTORY:   Past Surgical History:  Procedure Laterality Date   cataract surgery     INCISION AND DRAINAGE ABSCESS Right 06/29/2016   Procedure: INCISION AND DRAINAGE ABSCESS;  Surgeon: Florene Glen, MD;  Location: [facility] ORS;  Service: General;  Laterality: Right;   INCISION AND DRAINAGE OF WOUND Left 06/29/2016   Procedure: IRRIGATION AND DEBRIDEMENT WOUND;  Surgeon: Florene Glen, MD;  Location: [facility] ORS;  Service: General;  Laterality: Left;    SOCIAL HISTORY:   Social History   Tobacco Use   Smoking status: Never Smoker   Smokeless tobacco: Never Used  Substance Use Topics   Alcohol use: No    FAMILY HISTORY:   Family History  Problem Relation Age of Onset   Dementia Mother    Osteoporosis Mother    Vascular Disease Mother    COPD Father     DRUG ALLERGIES:   Allergies  Allergen Reactions   Flecainide Shortness Of Breath   Propafenone Shortness Of Breath and Swelling   Rivaroxaban Other (See Comments)    REVIEW OF SYSTEMS:   Review of Systems  Constitutional: Negative for chills  and fever.  HENT: Negative for hearing loss and tinnitus.   Eyes: Negative for blurred vision and double vision.  Respiratory: Negative for cough and shortness of breath.   Cardiovascular: Negative for chest pain and orthopnea.  Gastrointestinal: Positive for abdominal pain, diarrhea, nausea and vomiting. Negative for blood in stool and heartburn.  Genitourinary: Negative for dysuria and urgency.  Musculoskeletal: Negative for myalgias and neck pain.  Skin: Negative for  itching.       Venous stasis changes on both lower extremity.  Neurological: Negative for dizziness and headaches.  Psychiatric/Behavioral: Negative for depression and substance abuse.    MEDICATIONS AT HOME:   Prior to Admission medications   Medication Sig Start Date End Date Taking? Authorizing Provider  albuterol (VENTOLIN HFA) 108 (90 Base) MCG/ACT inhaler Inhale 2 puffs into the lungs every 4 (four) hours as needed for wheezing or shortness of breath. 07/07/19   Ronnell Freshwater, NP  ALPRAZolam (XANAX) 0.25 MG tablet Take 1 tablet (0.25 mg total) by mouth 2 (two) times daily. 07/07/19   Ronnell Freshwater, NP  apixaban (ELIQUIS) 5 MG TABS tablet Take 1 tablet (5 mg total) by mouth 2 (two) times daily. 06/16/19   Wellington Hampshire, MD  dexlansoprazole (DEXILANT) 60 MG capsule Take 1 capsule (60 mg total) by mouth daily. 08/02/19   Ronnell Freshwater, NP  diltiazem (CARDIZEM CD) 360 MG 24 hr capsule TAKE 1 CAPSULE (360 MG TOTAL) BY MOUTH DAILY. 05/17/19 08/15/19  Wellington Hampshire, MD  EPINEPHrine 0.3 mg/0.3 mL IJ SOAJ injection Inject into the muscle.    [provider]  furosemide (LASIX) 40 MG tablet Take 1 tablet (40 mg total) by mouth daily. 04/21/19   Ronnell Freshwater, NP  guaiFENesin (MUCINEX) 600 MG 12 hr tablet Take 600 mg by mouth 2 (two) times daily as needed.    [provider]  ibuprofen (ADVIL,MOTRIN) 200 MG tablet Take by mouth as needed.     [provider]  ipratropium-albuterol (DUONEB) 0.5-2.5 (3) MG/3ML SOLN Take 3 mLs by nebulization every 4 (four) hours as needed. 02/03/19   Ronnell Freshwater, NP  levalbuterol (XOPENEX) 0.31 MG/3ML nebulizer solution Take 3 mLs by nebulization every 4 (four) hours as needed. For wheezing/ and or shortness of breath. 02/05/16   [provider]  losartan (COZAAR) 25 MG tablet Take 1 tablet (25 mg total) by mouth daily. 07/27/19   Ronnell Freshwater, NP  montelukast (SINGULAIR) 10 MG tablet TAKE 1 TABLET BY MOUTH  EVERY DAY 04/23/19   Ronnell Freshwater, NP  Multiple Vitamin (MULTIVITAMIN) tablet Take 1 tablet by mouth daily.    [provider]  nystatin (MYCOSTATIN) 100000 UNIT/ML suspension Take 5 mLs (500,000 Units total) by mouth 4 (four) times daily. Patient taking differently: Take 5 mLs by mouth 4 (four) times daily as needed.  04/19/19   Ronnell Freshwater, NP  ondansetron (ZOFRAN-ODT) 8 MG disintegrating tablet Take 1 tablet (8 mg total) by mouth every 8 (eight) hours as needed for nausea or vomiting. 02/03/19   Ronnell Freshwater, NP  pantoprazole (PROTONIX) 40 MG tablet Take 1 tablet (40 mg total) by mouth daily. 09/06/19   Ronnell Freshwater, NP  phentermine (ADIPEX-P) 37.5 MG tablet Take 1 tablet (37.5 mg total) by mouth daily before breakfast. 07/07/19   Ronnell Freshwater, NP  predniSONE (DELTASONE) 10 MG tablet Take 1 tablet (10 mg total) by mouth daily with breakfast. 04/19/19   Ronnell Freshwater, NP  Probiotic Product (PROBIOTIC-10) CAPS Take 1 capsule by mouth daily.     [provider]  spironolactone (ALDACTONE) 25 MG tablet Take 1 tablet (25 mg total) by mouth 2 (two) times daily. 07/27/19   Ronnell Freshwater, NP  theophylline (THEO-24) 300 MG 24 hr capsule Take 1 capsule (300 mg total) by mouth daily. 08/23/19   Kendell Bane, NP      VITAL SIGNS:  Blood pressure 105/64, pulse (!) 140, temperature 99 F (37.2 C), temperature source Oral, resp. rate (!) 21, height 5' 4.5" (1.638 m), weight 127 kg, SpO2 94 %.  PHYSICAL EXAMINATION:  Physical Exam  GENERAL:  68 y.o.-year-old patient lying in the bed with no acute distress.  EYES: Pupils equal, round, reactive to light and accommodation. No scleral icterus. Extraocular muscles intact.  HEENT: Head atraumatic, normocephalic. Oropharynx and nasopharynx clear.  NECK:  Supple, no jugular venous distention. No thyroid enlargement, no tenderness.  LUNGS: Normal breath sounds bilaterally, no wheezing, rales,rhonchi or crepitation. No  use of accessory muscles of respiration.  CARDIOVASCULAR: S1, S2 normal. No murmurs, rubs, or gallops.  ABDOMEN: Tenderness in the lower abdomen more on the right lower quadrant compared to the left lower quadrant.  Bowel sounds positive. Marland Kitchen  EXTREMITIES: No pedal edema, cyanosis, or clubbing.  NEUROLOGIC: Moving all extremities with no focal deficit.  Sensation intact. Gait not checked.  PSYCHIATRIC: The patient is alert and oriented x 3.  SKIN: No obvious rash, lesion, or ulcer.   LABORATORY PANEL:   CBC Recent Labs  Lab 09/11/19 1041  WBC 11.8*  HGB 12.8  HCT 39.3  PLT 296   ------------------------------------------------------------------------------------------------------------------  Chemistries  Recent Labs  Lab 09/11/19 1041  NA 135  K 3.9  CL 101  CO2 26  GLUCOSE 108*  BUN 16  CREATININE 0.93  CALCIUM 8.3*  AST 24  ALT 18  ALKPHOS 56  BILITOT 1.0   ------------------------------------------------------------------------------------------------------------------  Cardiac Enzymes No results for input(s): TROPONINI in the last 168 hours. ------------------------------------------------------------------------------------------------------------------  RADIOLOGY:  Ct Head Wo Contrast  Result Date: 09/11/2019 CLINICAL DATA:  Head trauma, patient on anticoagulation EXAM: CT HEAD WITHOUT CONTRAST TECHNIQUE: Contiguous axial images were obtained from the base of the skull through the vertex without intravenous contrast. COMPARISON:  09/13/2013 FINDINGS: Brain: No evidence of acute infarction, hemorrhage, extra-axial collection, ventriculomegaly, or mass effect. Vascular: Cerebrovascular atherosclerotic calcifications are noted. Skull: Negative for fracture or focal lesion. Sinuses/Orbits: Visualized portions of the orbits are unremarkable. Visualized portions of the paranasal sinuses and mastoid air cells are unremarkable. Other: None. IMPRESSION: No acute  intracranial pathology. Electronically Signed   By: Kathreen Devoid   On: 09/11/2019 12:51   Ct Abdomen Pelvis W Contrast  Result Date: 09/11/2019 CLINICAL DATA:  Blunt abdominal trauma. Status post fall yesterday. Now with nausea, vomiting and body aches. EXAM: CT ABDOMEN AND PELVIS WITH CONTRAST TECHNIQUE: Multidetector CT imaging of the abdomen and pelvis was performed using the standard protocol following bolus administration of intravenous contrast. CONTRAST:  141mL OMNIPAQUE IOHEXOL 300 MG/ML  SOLN COMPARISON:  None. FINDINGS: Lower chest: No acute abnormality. Hepatobiliary: Small hypodense foci within the RIGHT liver lobe, too small to definitively characterize but most suggestive of benign cysts. No acute or suspicious findings within the liver. Gallbladder appears normal. No bile duct dilatation. Pancreas: Unremarkable. No pancreatic ductal dilatation or surrounding inflammatory changes. Spleen: Normal in size without focal abnormality. Adrenals/Urinary Tract: Adrenal glands appear normal. Kidneys appear normal without mass, stone or hydronephrosis. No ureteral  or bladder calculi identified. Bladder is decompressed. Stomach/Bowel: Extensive diverticulosis of the LEFT colon but no focal inflammatory change to suggest acute diverticulitis. Edematous thickening of the distal small bowel (ileum) with associated fluid stranding/inflammation in the surrounding mesentery. No pneumatosis intestinalis appreciated. More proximal small bowel is normal in caliber. No other site of bowel wall thickening/inflammation. Stomach is unremarkable, partially decompressed. Appendix is normal. Vascular/Lymphatic: Aortic atherosclerosis. No acute appearing vascular abnormality. No enlarged lymph nodes seen. Reproductive: Uterus and bilateral adnexa are unremarkable. Other: Small amount of free fluid in the lower abdomen and pelvis. No circumscribed collection or abscess like collection seen. No free intraperitoneal air.  Musculoskeletal: No acute or suspicious osseous finding. Degenerative spondylosis of the thoracolumbar spine, mild to moderate in degree. IMPRESSION: 1. Pronounced edematous thickening of the walls of the distal small bowel (ileum) with associated fluid stranding/inflammation in the surrounding mesentery. Findings are consistent with a focal small bowel enteritis of infectious, inflammatory or ischemic nature. Given patient's diffuse aortic atherosclerosis, I FAVOR ISCHEMIC BOWEL. Next most likely differential would be a focal severe infectious enteritis. Given patient's age, Crohn's disease is considered less likely. 2. Small amount of free fluid in the lower abdomen and pelvis. No circumscribed collection or abscess like collection seen. No free intraperitoneal air. 3. Extensive diverticulosis of the LEFT colon without evidence of acute diverticulitis. 4. Aortic atherosclerosis. Aortic Atherosclerosis (ICD10-I70.0). Electronically Signed   By: Franki Cabot M.D.   On: 09/11/2019 12:54   Dg Chest Port 1 View  Result Date: 09/11/2019 CLINICAL DATA:  Fall, abdominal pain, tachycardia EXAM: PORTABLE CHEST 1 VIEW COMPARISON:  CTA chest dated 06/29/2016 FINDINGS: Lungs are clear.  No pleural effusion or pneumothorax. The heart is top-normal in size.  Thoracic aortic atherosclerosis. IMPRESSION: No evidence of acute cardiopulmonary disease. Thoracic aortic atherosclerosis. Electronically Signed   By: Julian Hy M.D.   On: 09/11/2019 11:45      IMPRESSION AND PLAN:  Patient is a 68 year old female with history of chronic atrial fibrillation on anticoagulation with Eliquis, hypertension, CAD, peripheral artery disease who presented to the emergency room and diagnosed with enteritis rule out ischemic bowel as well as atrial fibrillation with rapid ventricular response  1.  Enteritis Patient presented with nausea, vomiting and diarrhea which has been going on since yesterday. Findings on CT abdomen and  pelvis suggestive of enteritis with concern for ischemic bowel. Lactic acid level however normal.  CT angiogram of abdomen already ordered. Consult placed to vascular surgery and general surgery. Patient does have history of atrial fibrillation on anticoagulation with Eliquis. Requested for stool studies including ova and parasite and C. Difficile. Patient subsequently had CT angiogram which revealed ostial stenoses of celiac axis and SMA of possible hemodynamic significance, with a relatively diminutive IMA. If there is continued clinical concern of ischemic bowel, arteriogram with pressure measurements across the stenoses may be useful to better define their hemodynamic significance I discussed findings with vascular surgeon on-call Dr. Lorenso Courier who stated there was low probability of acute primary vascular ischemia given normal lactate.  Likely low flow from atrial fibrillation and dehydration.  He recommended starting heparin drip and continuing IV fluids.  Placing patient on empiric antibiotics with rate control for atrial fibrillation and serial exams and labs.  He will continue to monitor patient.  If no improvement he will plan for angiogram in the next few days. Patient already seen by general surgeon as well. Will place patient n.p.o. for now since still complaining of severe pains.  PRN Dilaudid for pain control.  2.  Atrial fibrillation with rapid ventricular response Patient currently on Cardizem drip.  Being admitted to telemetry. Patient on Eliquis prior to admission. Patient to be placed on heparin drip for now Cardiology consult placed.  3.  Hypertension Blood pressure controlled. We will resume home meds once medication reconciliation is done  4.  COPD Stable.  DVT prophylaxis; SCDs for now Awaiting report of CT angiogram to ensure no urgent surgery is indicated before anticoagulation resumed.   All the records are reviewed and case discussed with ED provider. Management  plans discussed with the patient, family and they are in agreement. Updated husband present at bedside on treatment plans.  CODE STATUS: Full code  TOTAL TIME TAKING CARE OF THIS PATIENT: 62 minutes.    Nikiesha Milford M.D on 09/11/2019 at 2:43 PM  Between 7am to 6pm - Pager - [phone]  After 6pm go to [email or web address] - password [email or web address] [facility]  [employer]  Hospitalists  Office  [phone]  CC: Primary care physician; [name], MD   Note: This dictation was prepared with Dragon dictation along with smaller phrase technology. Any transcriptional errors that result from this process are unintentional.

## 2019-09-11 NOTE — ED Notes (Signed)
Patient transported to CT 

## 2019-09-11 NOTE — ED Provider Notes (Signed)
[facility] Emergency Department Provider Note   ____________________________________________    I have reviewed the triage vital signs and the nursing notes.   HISTORY  Chief Complaint Abdominal Pain, Fall, and Tachycardia     HPI Laura [name] is a 68 y.o. female with a history of atrial fibrillation on Eliquis who presents with complaints of abdominal pain.  Patient reports she had a fall yesterday, shortly after the fall she started having abdominal pain which she reports is primarily in the lower abdomen, right greater than left.  She reports some nausea no vomiting.  She reports she has had pain like this in the past, it has seemed to "come and go ".  She reports it is more severe today.  No fevers reported although has had some fatigue  Past Medical History:  Diagnosis Date  . Asthma   . Chronic atrial fibrillation    a. diagnosed in 09/2016; b. failed flecainide and propafenone due to LE swelling and SOB, could not afford Multaq; c. CHADS2VASc => 3 (HTN, age x 1, female); d. on Eliquis  . GERD (gastroesophageal reflux disease)   . History of stress test    a. Lexiscan Myoview 10/2016: no evidence of ischemia, EF 53%  . Hypertension   . Obesity   . Obstructive sleep apnea   . Pulmonary hypertension ([place])   . Systolic dysfunction    a. TTE 10/2016: EF 50%, mild LVH, moderately dilated LA, moderate MR/TR, mild pulmonary hypertension    Patient Active Problem List   Diagnosis Date Noted  . Peripheral edema 07/17/2019  . Atherosclerosis of both carotid arteries 07/17/2019  . Varicose veins of right lower extremity with inflammation 06/08/2019  . Gastroesophageal reflux disease with esophagitis 05/08/2019  . Fall at home, initial encounter 03/16/2019  . Contusion of right lower leg 03/16/2019  . Flu-like symptoms 02/03/2019  . Nausea 02/03/2019  . Suprapatellar bursitis of right knee 01/23/2019  . Pain in right leg 01/23/2019  .  Cellulitis of right leg 01/23/2019  . Chronic venous stasis dermatitis of right lower extremity 01/23/2019  . Oropharyngeal candidiasis 01/23/2019  . Cellulitis of head except face 12/23/2018  . Screening for breast cancer 08/15/2018  . Chronic bronchitis with acute exacerbation ([place]) 08/15/2018  . Vitamin D deficiency 08/15/2018  . Need for vaccination against Streptococcus pneumoniae using pneumococcal conjugate vaccine 13 08/15/2018  . Obstructive chronic bronchitis without exacerbation ([place]) 07/14/2018  . Body mass index (BMI) 45.0-49.9, adult ([place]) 03/17/2018  . Asthma 02/25/2018  . Generalized anxiety disorder 12/16/2017  . Essential (primary) hypertension 12/16/2017  . Chronic respiratory failure with hypoxia ([place]) 12/16/2017  . Long term (current) use of anticoagulants 12/16/2017  . Chronic atrial fibrillation 12/16/2017  . Other specified functional intestinal disorders 12/16/2017  . Gastro-esophageal reflux disease without esophagitis 12/16/2017  . Obstructive sleep apnea of adult 12/16/2017  . Allergic rhinitis due to animal (cat) (dog) hair and dander 12/16/2017  . Allergic rhinitis due to pollen 12/16/2017  . Severe persistent asthma without complication [id number]  . Cough 12/16/2017  . SOB (shortness of breath) 12/16/2017  . Superficial thrombophlebitis of right leg 11/17/2016  . Pain in limb 11/17/2016  . Chronic venous insufficiency 11/17/2016  . Swelling of limb 11/17/2016  . Cellulitis of buttock   . Cellulitis of right axilla   . Sepsis ([place]) 06/28/2016  . Cellulitis and abscess 06/28/2016  . Hypokalemia 06/28/2016  . Acute renal insufficiency 06/28/2016  . Hyperglycemia 06/28/2016  . Leukocytosis  06/28/2016  . Hypoxia 06/28/2016  . Collagenous colitis 05/13/2016    Past Surgical History:  Procedure Laterality Date  . cataract surgery    . INCISION AND DRAINAGE ABSCESS Right 06/29/2016   Procedure: INCISION AND DRAINAGE ABSCESS;  Surgeon: Florene Glen, MD;  Location: [facility] ORS;  Service: General;  Laterality: Right;  . INCISION AND DRAINAGE OF WOUND Left 06/29/2016   Procedure: IRRIGATION AND DEBRIDEMENT WOUND;  Surgeon: Florene Glen, MD;  Location: [facility] ORS;  Service: General;  Laterality: Left;    Prior to Admission medications   Medication Sig Start Date End Date Taking? Authorizing Provider  albuterol (VENTOLIN HFA) 108 (90 Base) MCG/ACT inhaler Inhale 2 puffs into the lungs every 4 (four) hours as needed for wheezing or shortness of breath. 07/07/19   Ronnell Freshwater, NP  ALPRAZolam (XANAX) 0.25 MG tablet Take 1 tablet (0.25 mg total) by mouth 2 (two) times daily. 07/07/19   Ronnell Freshwater, NP  apixaban (ELIQUIS) 5 MG TABS tablet Take 1 tablet (5 mg total) by mouth 2 (two) times daily. 06/16/19   Wellington Hampshire, MD  dexlansoprazole (DEXILANT) 60 MG capsule Take 1 capsule (60 mg total) by mouth daily. 08/02/19   Ronnell Freshwater, NP  diltiazem (CARDIZEM CD) 360 MG 24 hr capsule TAKE 1 CAPSULE (360 MG TOTAL) BY MOUTH DAILY. 05/17/19 08/15/19  Wellington Hampshire, MD  EPINEPHrine 0.3 mg/0.3 mL IJ SOAJ injection Inject into the muscle.    [provider]  furosemide (LASIX) 40 MG tablet Take 1 tablet (40 mg total) by mouth daily. 04/21/19   Ronnell Freshwater, NP  guaiFENesin (MUCINEX) 600 MG 12 hr tablet Take 600 mg by mouth 2 (two) times daily as needed.    [provider]  ibuprofen (ADVIL,MOTRIN) 200 MG tablet Take by mouth as needed.     [provider]  ipratropium-albuterol (DUONEB) 0.5-2.5 (3) MG/3ML SOLN Take 3 mLs by nebulization every 4 (four) hours as needed. 02/03/19   Ronnell Freshwater, NP  levalbuterol (XOPENEX) 0.31 MG/3ML nebulizer solution Take 3 mLs by nebulization every 4 (four) hours as needed. For wheezing/ and or shortness of breath. 02/05/16   [provider]  losartan (COZAAR) 25 MG tablet Take 1 tablet (25 mg total) by mouth daily. 07/27/19   Ronnell Freshwater, NP  montelukast  (SINGULAIR) 10 MG tablet TAKE 1 TABLET BY MOUTH EVERY DAY 04/23/19   Ronnell Freshwater, NP  Multiple Vitamin (MULTIVITAMIN) tablet Take 1 tablet by mouth daily.    [provider]  NON FORMULARY cpap with oxygen    [provider]  nystatin (MYCOSTATIN) 100000 UNIT/ML suspension Take 5 mLs (500,000 Units total) by mouth 4 (four) times daily. Patient taking differently: Take 5 mLs by mouth 4 (four) times daily as needed.  04/19/19   Ronnell Freshwater, NP  ondansetron (ZOFRAN-ODT) 8 MG disintegrating tablet Take 1 tablet (8 mg total) by mouth every 8 (eight) hours as needed for nausea or vomiting. 02/03/19   Ronnell Freshwater, NP  pantoprazole (PROTONIX) 40 MG tablet Take 1 tablet (40 mg total) by mouth daily. 09/06/19   Ronnell Freshwater, NP  phentermine (ADIPEX-P) 37.5 MG tablet Take 1 tablet (37.5 mg total) by mouth daily before breakfast. 07/07/19   Ronnell Freshwater, NP  predniSONE (DELTASONE) 10 MG tablet Take 1 tablet (10 mg total) by mouth daily with breakfast. 04/19/19   Ronnell Freshwater, NP  Probiotic Product (PROBIOTIC-10) CAPS Take 1  capsule by mouth daily.     [provider]  spironolactone (ALDACTONE) 25 MG tablet Take 1 tablet (25 mg total) by mouth 2 (two) times daily. 07/27/19   Ronnell Freshwater, NP  theophylline (THEO-24) 300 MG 24 hr capsule Take 1 capsule (300 mg total) by mouth daily. 08/23/19   Kendell Bane, NP     Allergies Flecainide, Propafenone, and Rivaroxaban  Family History  Problem Relation Age of Onset  . Dementia Mother   . Osteoporosis Mother   . Vascular Disease Mother   . COPD Father     Social History Social History   Tobacco Use  . Smoking status: Never Smoker  . Smokeless tobacco: Never Used  Substance Use Topics  . Alcohol use: No  . Drug use: No    Review of Systems  Constitutional: No fever/chills Eyes: No visual changes.  ENT: No sore throat. Cardiovascular: Denies chest pain. Respiratory: Denies shortness of  breath. Gastrointestinal: As above Genitourinary: Negative for dysuria. Musculoskeletal: Negative for back pain. Skin: Negative for rash. Neurological: Negative for headaches    ____________________________________________   PHYSICAL EXAM:  VITAL SIGNS: ED Triage Vitals  Enc Vitals Group     BP 09/11/19 1038 (!) 121/100     Pulse Rate 09/11/19 1038 (!) 124     Resp 09/11/19 1038 20     Temp 09/11/19 1038 99 F (37.2 C)     Temp Source 09/11/19 1038 Oral     SpO2 09/11/19 1038 96 %     Weight 09/11/19 1040 127 kg (280 lb)     Height 09/11/19 1040 1.638 m (5' 4.5")     Head Circumference --      Peak Flow --      Pain Score 09/11/19 1038 7     Pain Loc --      Pain Edu? --      Excl. in [place]? --     Constitutional: Alert and oriented.  Nose: No congestion/rhinnorhea. Mouth/Throat: Mucous membranes are moist.   Neck:  Painless ROM Cardiovascular: Tachycardia, regular rhythm grossly normal heart sounds.  Good peripheral circulation. Respiratory: Normal respiratory effort.  No retractions. Lungs CTAB. Gastrointestinal: Tenderness palpation primarily in the right lower quadrant, no distention Genitourinary: deferred Musculoskeletal:  Warm and well perfused Neurologic:  Normal speech and language. No gross focal neurologic deficits are appreciated.  Skin:  Skin is warm, dry and intact. No rash noted. Psychiatric: Mood and affect are normal. Speech and behavior are normal.  ____________________________________________   LABS (all labs ordered are listed, but only abnormal results are displayed)  Labs Reviewed  CBC WITH DIFFERENTIAL/PLATELET - Abnormal; Notable for the following components:      Result Value   WBC 11.8 (*)    Neutro Abs 9.4 (*)    All other components within normal limits  COMPREHENSIVE METABOLIC PANEL - Abnormal; Notable for the following components:   Glucose, Bld 108 (*)    Calcium 8.3 (*)    Total Protein 6.0 (*)    Albumin 3.3 (*)    All other  components within normal limits  SARS CORONAVIRUS 2 (HOSPITAL ORDER, [facility])  LACTIC ACID, PLASMA  LACTIC ACID, PLASMA  TROPONIN I (HIGH SENSITIVITY)   ____________________________________________  EKG  ED ECG REPORT I, Lavonia Drafts, the attending physician, personally viewed and interpreted this ECG.  Date: 09/11/2019  Rhythm: Atrial fibrillation QRS Axis: normal Intervals: normal ST/T Wave abnormalities: normal Narrative Interpretation: Atrial fibrillation with  RVR  ____________________________________________  RADIOLOGY  CT head unremarkable CT of the pelvis demonstrates enteritis possibly infectious versus ischemic ____________________________________________   PROCEDURES  Procedure(s) performed: No  Procedures   Critical Care performed: yes  CRITICAL CARE Performed by: Lavonia Drafts   Total critical care time: 30 minutes  Critical care time was exclusive of separately billable procedures and treating other patients.  Critical care was necessary to treat or prevent imminent or life-threatening deterioration.  Critical care was time spent personally by me on the following activities: development of treatment plan with patient and/or surrogate as well as nursing, discussions with consultants, evaluation of patient's response to treatment, examination of patient, obtaining history from patient or surrogate, ordering and performing treatments and interventions, ordering and review of laboratory studies, ordering and review of radiographic studies, pulse oximetry and re-evaluation of patient's condition.  ____________________________________________   INITIAL IMPRESSION / ASSESSMENT AND PLAN / ED COURSE  Pertinent labs & imaging results that were available during my care of the patient were reviewed by me and considered in my medical decision making (see chart for details).  Patient presents with abdominal pain as detailed  above, status post fall, unclear whether it was related to the fall or not.  Found to be in atrial fibrillation with RVR will give 10 mg IV Cardizem and reevaluate.  Lab work overall reassuring, mild elevation in white blood cell count is nonspecific.  CT scan of abdomen pelvis and head ordered  CT demonstrates enteritis, possibly infectious versus ischemic, unclear.  Discussed with Dr. Lorenso Courier vascular surgery who recommends CT angiography and possible heparin if significant abnormalities.  Patient is already anticoagulated on Eliquis  Patient's heart rate has trended back up we will start Cardizem drip and admit to the medicine service      ____________________________________________   FINAL CLINICAL IMPRESSION(S) / ED DIAGNOSES  Final diagnoses:  Atrial fibrillation with rapid ventricular response ([place])  Enteritis  Ischemic bowel disease ([place])        Note:  This document was prepared using Dragon voice recognition software and may include unintentional dictation errors.   Lavonia Drafts, MD 09/11/19 1345

## 2019-09-11 NOTE — ED Triage Notes (Addendum)
Pt arrives via [facility].  Per EMS, pt fells yesterday at home at approx 10am. Pt unaware of Loc- it "happened really fast". Pt reports starting after fall n/v/ chills/ body aches yesterday. Today pt has had diarrhea.  Pt also c/o abdominal pain starting last night 10/10. Pain is sharp and moves to back with movement. Pt hx of afib- per ems pt in afib on truck, HR 150's-200's. Pt given 2.5mg  metoprolol and 4mg  zofran.   Ems VS- bp 110/60 (after metoprolol), temp 99.9 CBG 114.

## 2019-09-11 NOTE — Consult Note (Addendum)
Cardiology Consultation:   Patient ID: Laura [name] MRN: [id number]; DOB: January 28, 1951  Admit date: 09/11/2019 Date of Consult: 09/11/2019  Primary Care Provider: [name], MD Primary Cardiologist: Kathlyn Sacramento, MD Primary Electrophysiologist:  None    Patient Profile:   Laura [name] is a 68 y.o. female with a hx of persistent atrial fibrillation, hypertension, COPD, PAD, and OSA who is being seen today for the evaluation of atrial fibrillation with rapid ventricular response at the request of Dr. Stark Jock.  History of Present Illness:   [name] fell on 9/19.  She reports that it was a mechanical fall without preceding chest pain, palpitations, or lightheadedness.  Since then she reports lower abdominal pain, nausea and emesis that has gotten progressively worse.  She developed chills and diaphoresis.  She also notes innumerable episodes of emesis.  She presented to the ED for evaluation.  In the ED she was afebrile and BP was 121/100 but found to be in atrial fibrillation with rapid ventricular response.  Heart rates were in the 140s.  She was started on a diltiazem infusion.  She had a CT scan of the abdomen and pelvis that showed findings concerning for small bowel enteritis with an either infectious or ischemic etiology.  He was felt to be most likely ischemic.  However, lactate was within normal limits.  CT-A showed ostial stenosis of the celiac and SMA with possible mild obstruction.  IMA was diminutive.  She had diffuse atherosclerosis.  Cardiology was consulted for management of atrial fibrillation with ventricular response.  She continues to complain of lower abdominal pain.   While in the ED she reported left-sided chest pain.  EKG revealed atrial fibrillation with a rate of 121 bpm.  There is no evidence of acute ischemic changes on EKG.  [name] has a history of chronic atrial fibrillation.  Was first diagnosed in 2017.  She previously failed both flecainide and  propafenone due to lower extremity edema.  She was unable to afford dronedarone.  She never underwent cardioversion.  She has been anticoagulated with Eliquis.  She had an echo 04/2017 that revealed LVEF 50 to 55%.  She had a [employer] 10/2016 that revealed LVEF 53% and no ischemia.  She last had a virtual appointment with Christell Faith, PA-C on 05/2019 and was doing well at that time.  At baseline she uses a scooter 2/2 LE pain.  She can walk up a flight of steps but is limited by shortness of breath that she attributes to her obesity. She has no chest pain.    Heart Pathway Score:     Past Medical History:  Diagnosis Date   Asthma    Chronic atrial fibrillation    a. diagnosed in 09/2016; b. failed flecainide and propafenone due to LE swelling and SOB, could not afford Multaq; c. CHADS2VASc => 3 (HTN, age x 1, female); d. on Eliquis   GERD (gastroesophageal reflux disease)    History of stress test    a. Lexiscan Myoview 10/2016: no evidence of ischemia, EF 53%   Hypertension    Obesity    Obstructive sleep apnea    Pulmonary hypertension (HCC)    Systolic dysfunction    a. TTE 10/2016: EF 50%, mild LVH, moderately dilated LA, moderate MR/TR, mild pulmonary hypertension    Past Surgical History:  Procedure Laterality Date   cataract surgery     INCISION AND DRAINAGE ABSCESS Right 06/29/2016   Procedure: INCISION AND DRAINAGE ABSCESS;  Surgeon:  Florene Glen, MD;  Location: [facility] ORS;  Service: General;  Laterality: Right;   INCISION AND DRAINAGE OF WOUND Left 06/29/2016   Procedure: IRRIGATION AND DEBRIDEMENT WOUND;  Surgeon: Florene Glen, MD;  Location: [facility] ORS;  Service: General;  Laterality: Left;     Home Medications:  Prior to Admission medications   Medication Sig Start Date End Date Taking? Authorizing Provider  albuterol (VENTOLIN HFA) 108 (90 Base) MCG/ACT inhaler Inhale 2 puffs into the lungs every 4 (four) hours as needed for wheezing or shortness of  breath. 07/07/19  Yes Boscia, Greer Ee, NP  ALPRAZolam (XANAX) 0.25 MG tablet Take 1 tablet (0.25 mg total) by mouth 2 (two) times daily. 07/07/19  Yes Boscia, Greer Ee, NP  apixaban (ELIQUIS) 5 MG TABS tablet Take 1 tablet (5 mg total) by mouth 2 (two) times daily. 06/16/19  Yes Wellington Hampshire, MD  dexlansoprazole (DEXILANT) 60 MG capsule Take 1 capsule (60 mg total) by mouth daily. 08/02/19  Yes Boscia, Heather E, NP  diltiazem (CARDIZEM CD) 360 MG 24 hr capsule TAKE 1 CAPSULE (360 MG TOTAL) BY MOUTH DAILY. 05/17/19  Yes Wellington Hampshire, MD  EPINEPHrine 0.3 mg/0.3 mL IJ SOAJ injection Inject 0.3 mg into the muscle as needed for anaphylaxis.    Yes [provider]  furosemide (LASIX) 40 MG tablet Take 1 tablet (40 mg total) by mouth daily. 04/21/19  Yes Boscia, Greer Ee, NP  guaiFENesin (MUCINEX) 600 MG 12 hr tablet Take 600 mg by mouth 2 (two) times daily as needed for cough or to loosen phlegm.    Yes [provider]  ibuprofen (ADVIL,MOTRIN) 200 MG tablet Take 400-800 mg by mouth every 6 (six) hours as needed for fever or mild pain.    Yes [provider]  ipratropium-albuterol (DUONEB) 0.5-2.5 (3) MG/3ML SOLN Take 3 mLs by nebulization every 4 (four) hours as needed. Patient taking differently: Take 3 mLs by nebulization every 4 (four) hours as needed (shortness of breath / wheezing).  02/03/19  Yes Boscia, Greer Ee, NP  levalbuterol (XOPENEX) 0.31 MG/3ML nebulizer solution Take 3 mLs by nebulization every 4 (four) hours as needed. For wheezing/ and or shortness of breath. 02/05/16  Yes [provider]  losartan (COZAAR) 25 MG tablet Take 1 tablet (25 mg total) by mouth daily. 07/27/19  Yes Boscia, Heather E, NP  montelukast (SINGULAIR) 10 MG tablet TAKE 1 TABLET BY MOUTH EVERY DAY Patient taking differently: Take 10 mg by mouth at bedtime.  04/23/19  Yes Ronnell Freshwater, NP  Multiple Vitamin (MULTIVITAMIN) tablet Take 1 tablet by mouth daily.   Yes [provider]  nystatin (MYCOSTATIN) 100000 UNIT/ML suspension Take 5 mLs (500,000 Units total) by mouth 4 (four) times daily. Patient taking differently: Take 5 mLs by mouth 4 (four) times daily as needed.  04/19/19  Yes Boscia, Greer Ee, NP  ondansetron (ZOFRAN-ODT) 8 MG disintegrating tablet Take 1 tablet (8 mg total) by mouth every 8 (eight) hours as needed for nausea or vomiting. 02/03/19  Yes Boscia, Heather E, NP  predniSONE (DELTASONE) 10 MG tablet Take 1 tablet (10 mg total) by mouth daily with breakfast. 04/19/19  Yes Ronnell Freshwater, NP  Probiotic Product (PROBIOTIC-10) CAPS Take 1 capsule by mouth daily.    Yes [provider]  spironolactone (ALDACTONE) 25 MG tablet Take 1 tablet (25 mg total) by mouth 2 (two) times daily. 07/27/19  Yes Ronnell Freshwater, NP  theophylline (THEO-24) 300 MG  24 hr capsule Take 1 capsule (300 mg total) by mouth daily. 08/23/19  Yes Scarboro, Audie Clear, NP  pantoprazole (PROTONIX) 40 MG tablet Take 1 tablet (40 mg total) by mouth daily. Patient not taking: Reported on 09/11/2019 09/06/19   Ronnell Freshwater, NP  phentermine (ADIPEX-P) 37.5 MG tablet Take 1 tablet (37.5 mg total) by mouth daily before breakfast. Patient not taking: Reported on 09/11/2019 07/07/19   Ronnell Freshwater, NP    Inpatient Medications: Scheduled Meds:  Continuous Infusions:  sodium chloride     diltiazem (CARDIZEM) infusion 12.5 mg/hr (09/11/19 1447)   sodium chloride     PRN Meds: iohexol, ipratropium-albuterol  Allergies:    Allergies  Allergen Reactions   Flecainide Shortness Of Breath   Propafenone Shortness Of Breath and Swelling   Rivaroxaban Other (See Comments)    Social History:   Social History   Socioeconomic History   Marital status: Married    Spouse name: Not on file   Number of children: Not on file   Years of education: Not on file   Highest education level: Not on file  Occupational History   Not on file  Social Needs    Financial resource strain: Not on file   Food insecurity    Worry: Not on file    Inability: Not on file   Transportation needs    Medical: Not on file    Non-medical: Not on file  Tobacco Use   Smoking status: Never Smoker   Smokeless tobacco: Never Used  Substance and Sexual Activity   Alcohol use: No   Drug use: No   Sexual activity: Not on file  Lifestyle   Physical activity    Days per week: Not on file    Minutes per session: Not on file   Stress: Not on file  Relationships   Social connections    Talks on phone: Not on file    Gets together: Not on file    Attends religious service: Not on file    Active member of club or organization: Not on file    Attends meetings of clubs or organizations: Not on file    Relationship status: Not on file   Intimate partner violence    Fear of current or ex partner: Not on file    Emotionally abused: Not on file    Physically abused: Not on file    Forced sexual activity: Not on file  Other Topics Concern   Not on file  Social History Narrative   Not on file    Family History:    Family History  Problem Relation Age of Onset   Dementia Mother    Osteoporosis Mother    Vascular Disease Mother    COPD Father      ROS:  Please see the history of present illness.  All other ROS reviewed and negative.     Physical Exam/Data:   Vitals:   09/11/19 1345 09/11/19 1400 09/11/19 1430 09/11/19 1443  BP:  116/62 105/64 116/87  Pulse:      Resp: (!) 22 (!) 23 (!) 21 20  Temp:      TempSrc:      SpO2:    98%  Weight:      Height:       No intake or output data in the 24 hours ending 09/11/19 1517 Last 3 Weights 09/11/2019 08/16/2019 08/12/2019  Weight (lbs) 280 lb 285 lb 281 lb  Weight (kg) 127.007 kg  129.275 kg 127.461 kg     VS:  BP 111/71 (BP Location: Left Arm)    Pulse (!) 115    Temp 98.9 F (37.2 C) (Oral)    Resp 19    Ht 5' 4.5" (1.638 m)    Wt 127 kg    SpO2 98%    BMI 47.32 kg/m  , BMI Body  mass index is 47.32 kg/m. GENERAL:  Ill appearing.  Moderate distress 2/2 pain. HEENT: Pupils equal round and reactive, fundi not visualized, oral mucosa unremarkable NECK:  No jugular venous distention, waveform within normal limits, carotid upstroke brisk and symmetric, no bruits LUNGS:  Clear to auscultation bilaterally HEART: Tachycardic.  Irregularly irregular  PMI not displaced or sustained,S1 and S2 within normal limits, no S3, no S4, no clicks, no rubs, no murmurs ABD:  Flat, positive bowel sounds normal in frequency in pitch, no bruits, no rebound, no guarding, no midline pulsatile mass, no hepatomegaly, no splenomegaly EXT:  2 plus pulses throughout, no edema, no cyanosis no clubbing SKIN:  Chronic stasis dermatitis R LE NEURO:  Cranial nerves II through XII grossly intact, motor grossly intact throughout PSYCH:  Cognitively intact, oriented to person place and time   EKG:  The EKG was personally reviewed and demonstrates: Atrial fibrillation.  Rate 121 bpm.  PVC.  Low voltage.  Nonspecific ST/T changes.  Telemetry:  Telemetry was personally reviewed and demonstrates:  Atrial fibrillation. Rates >100 bpm  Relevant CV Studies: Echo 07/2019: LVEF >60%.  Moderate LVH.  Moderate LA enlargement.  Mild MR, mild TR, trace PR.   Laboratory Data:  High Sensitivity Troponin:   Recent Labs  Lab 09/11/19 1041  TROPONINIHS 9     Chemistry Recent Labs  Lab 09/11/19 1041  NA 135  K 3.9  CL 101  CO2 26  GLUCOSE 108*  BUN 16  CREATININE 0.93  CALCIUM 8.3*  GFRNONAA >60  GFRAA >60  ANIONGAP 8    Recent Labs  Lab 09/11/19 1041  PROT 6.0*  ALBUMIN 3.3*  AST 24  ALT 18  ALKPHOS 56  BILITOT 1.0   Hematology Recent Labs  Lab 09/11/19 1041  WBC 11.8*  RBC 4.22  HGB 12.8  HCT 39.3  MCV 93.1  MCH 30.3  MCHC 32.6  RDW 13.8  PLT 296   BNPNo results for input(s): BNP, PROBNP in the last 168 hours.  DDimer No results for input(s): DDIMER in the last 168  hours.   Radiology/Studies:  Ct Head Wo Contrast  Result Date: 09/11/2019 CLINICAL DATA:  Head trauma, patient on anticoagulation EXAM: CT HEAD WITHOUT CONTRAST TECHNIQUE: Contiguous axial images were obtained from the base of the skull through the vertex without intravenous contrast. COMPARISON:  09/13/2013 FINDINGS: Brain: No evidence of acute infarction, hemorrhage, extra-axial collection, ventriculomegaly, or mass effect. Vascular: Cerebrovascular atherosclerotic calcifications are noted. Skull: Negative for fracture or focal lesion. Sinuses/Orbits: Visualized portions of the orbits are unremarkable. Visualized portions of the paranasal sinuses and mastoid air cells are unremarkable. Other: None. IMPRESSION: No acute intracranial pathology. Electronically Signed   By: Kathreen Devoid   On: 09/11/2019 12:51   Ct Abdomen Pelvis W Contrast  Result Date: 09/11/2019 CLINICAL DATA:  Blunt abdominal trauma. Status post fall yesterday. Now with nausea, vomiting and body aches. EXAM: CT ABDOMEN AND PELVIS WITH CONTRAST TECHNIQUE: Multidetector CT imaging of the abdomen and pelvis was performed using the standard protocol following bolus administration of intravenous contrast. CONTRAST:  165mL OMNIPAQUE IOHEXOL 300 MG/ML  SOLN COMPARISON:  None. FINDINGS: Lower chest: No acute abnormality. Hepatobiliary: Small hypodense foci within the RIGHT liver lobe, too small to definitively characterize but most suggestive of benign cysts. No acute or suspicious findings within the liver. Gallbladder appears normal. No bile duct dilatation. Pancreas: Unremarkable. No pancreatic ductal dilatation or surrounding inflammatory changes. Spleen: Normal in size without focal abnormality. Adrenals/Urinary Tract: Adrenal glands appear normal. Kidneys appear normal without mass, stone or hydronephrosis. No ureteral or bladder calculi identified. Bladder is decompressed. Stomach/Bowel: Extensive diverticulosis of the LEFT colon but no  focal inflammatory change to suggest acute diverticulitis. Edematous thickening of the distal small bowel (ileum) with associated fluid stranding/inflammation in the surrounding mesentery. No pneumatosis intestinalis appreciated. More proximal small bowel is normal in caliber. No other site of bowel wall thickening/inflammation. Stomach is unremarkable, partially decompressed. Appendix is normal. Vascular/Lymphatic: Aortic atherosclerosis. No acute appearing vascular abnormality. No enlarged lymph nodes seen. Reproductive: Uterus and bilateral adnexa are unremarkable. Other: Small amount of free fluid in the lower abdomen and pelvis. No circumscribed collection or abscess like collection seen. No free intraperitoneal air. Musculoskeletal: No acute or suspicious osseous finding. Degenerative spondylosis of the thoracolumbar spine, mild to moderate in degree. IMPRESSION: 1. Pronounced edematous thickening of the walls of the distal small bowel (ileum) with associated fluid stranding/inflammation in the surrounding mesentery. Findings are consistent with a focal small bowel enteritis of infectious, inflammatory or ischemic nature. Given patient's diffuse aortic atherosclerosis, I FAVOR ISCHEMIC BOWEL. Next most likely differential would be a focal severe infectious enteritis. Given patient's age, Crohn's disease is considered less likely. 2. Small amount of free fluid in the lower abdomen and pelvis. No circumscribed collection or abscess like collection seen. No free intraperitoneal air. 3. Extensive diverticulosis of the LEFT colon without evidence of acute diverticulitis. 4. Aortic atherosclerosis. Aortic Atherosclerosis (ICD10-I70.0). Electronically Signed   By: Franki Cabot M.D.   On: 09/11/2019 12:54   Dg Chest Port 1 View  Result Date: 09/11/2019 CLINICAL DATA:  Fall, abdominal pain, tachycardia EXAM: PORTABLE CHEST 1 VIEW COMPARISON:  CTA chest dated 06/29/2016 FINDINGS: Lungs are clear.  No pleural  effusion or pneumothorax. The heart is top-normal in size.  Thoracic aortic atherosclerosis. IMPRESSION: No evidence of acute cardiopulmonary disease. Thoracic aortic atherosclerosis. Electronically Signed   By: Julian Hy M.D.   On: 09/11/2019 11:45    Assessment and Plan:   # Chronic atrial fibrillation: Currently in afib with RVR.  HR is a little better but remains >100 bpm.  Will add IV metoprolol.  Home Eliquis is on hold due to concern for ischemic colitis.  She reports compliance with Eliquis making embolic disease less likely.  Transition to oral nodal agents when able.  Both amiodarone and digoxin can be used if BP does not tolerate additional nodal agents.  Continue IV fluid.   # Acute colitis: Ischemic vs. Infection.  Ostial disease of the celiac and SMA noted, though lactate is wnl, making ischemia seem less likely.  Vascular surgery following.  Antibiotics per IM.   # Hypertension:  Holding home losartan while in afib with RVR and requiring dilitiazem.    # PAD: # Abdominal atherosclerosis: The significance of her ostial SMA and celiac disease is unclear.  She has findings concerning for ischemic vs infectious colitis.  Appreciate vascular surgery following.  Agree with holding Eliquis and starting heparin in case surgical intervention is needed.  LDL should be <70.      For questions or updates, please contact  [facility] Please consult [email or web address] for contact info under     Signed, Skeet Latch, MD  09/11/2019 3:17 PM

## 2019-09-11 NOTE — ED Notes (Signed)
EDP at bedside at this time.  

## 2019-09-11 NOTE — Consult Note (Addendum)
[place] for: Heparin dosing (no bolus)  Indication: Suspicion of ischemic bowel.  Allergies  Allergen Reactions  . Flecainide Shortness Of Breath  . Propafenone Shortness Of Breath and Swelling  . Rivaroxaban Other (See Comments)    Patient Measurements: Height: 5' 4.5" (163.8 cm) Weight: 280 lb (127 kg) IBW/kg (Calculated) : 55.85 Heparin Dosing Weight: 87 kg   Vital Signs: Temp: 98.9 F (37.2 C) (09/20 1627) Temp Source: Oral (09/20 1627) BP: 111/71 (09/20 1627) Pulse Rate: 115 (09/20 1627)  Labs: Recent Labs    09/11/19 1041  HGB 12.8  HCT 39.3  PLT 296  CREATININE 0.93  TROPONINIHS 9    Estimated Creatinine Clearance: 76 mL/min (by C-G formula based on SCr of 0.93 mg/dL).   Medications:  Eliquis 5 mg BID- last known dose 09/10/19 @ 2000  Assessment: Patient has a PMH significant for atrial fibrillation (on Eliquis), pulmonary HTN, PAD who presents with complaints of abdominal pain, N/V, and diarrhea. Per MD, defer bolus. For now, will base levels on aPTT until levels correlate with HL.    Goal of Therapy:  Heparin level 0.3-0.7 units/ml aPTT 66-102s seconds Monitor platelets by anticoagulation protocol: Yes   Plan:  Baseline labs have been ordered  Heparin DW: 87 kg  Start heparin infusion at 1000 units/hr Check aPTT in 6 hours and HL daily while on heparin, per protocol. Continue to monitor H&H and platelets   Brek Reece R Lucita Montoya 09/11/2019,5:04 PM

## 2019-09-11 NOTE — Anesthesia Procedure Notes (Signed)
Procedure Name: Intubation Date/Time: 09/11/2019 9:48 PM Performed by: Jonna Clark, CRNA Pre-anesthesia Checklist: Patient identified, Patient being monitored, Timeout performed, Emergency Drugs available and Suction available Patient Re-evaluated:Patient Re-evaluated prior to induction Oxygen Delivery Method: Circle system utilized Preoxygenation: Pre-oxygenation with 100% oxygen Induction Type: IV induction Ventilation: Mask ventilation without difficulty Laryngoscope Size: 3 and McGraph Grade View: Grade I Tube type: Oral Tube size: 7.0 mm Number of attempts: 1 Airway Equipment and Method: Stylet Placement Confirmation: ETT inserted through vocal cords under direct vision,  positive ETCO2 and breath sounds checked- equal and bilateral Secured at: 21 cm Tube secured with: Tape Dental Injury: Teeth and Oropharynx as per pre-operative assessment

## 2019-09-11 NOTE — ED Notes (Signed)
Pt returned from CT at this time.  

## 2019-09-11 NOTE — ED Notes (Signed)
Pt c/o chest pain. Reports it started 30 minutes ago and describes as a "twinge" to left chest.  Repeat EKG done and dr Corky Downs notified.

## 2019-09-11 NOTE — Progress Notes (Signed)
SCDs applied.

## 2019-09-11 NOTE — Progress Notes (Signed)
Care Alignment Note  Advanced Directives Documents (Living Will, Power of Attorney) currently in the EHR no advanced directives documents available .  Has the patient discussed their wishes with their family/healthcare power of attorney yes. How much does the family or healthcare power of attorney know about their wishes. Patient's husband at bedside understands patient's clinical condition including atrial fibrillation with rapid ventricular response.  Now diagnosed with enteritis with concern for ischemic bowel.  What does the patient/decision maker understand about their medical condition and the natural course of their disease.  Atrial fibrillation with rapid regular response.  Enteritis with concern for ischemic bowel.  COPD.  Peripheral artery disease.  What is the patient/decision maker's biggest fear or concern for the future pain and suffering   What is the most important goal for this patient should their health condition worsen maintenance of function.  Current   Code Status: Full Code  Current code status has been reviewed/updated.  Time spent:18 minutes

## 2019-09-12 ENCOUNTER — Encounter
Admission: EM | Disposition: A | Discharge status: Home Health | Payer: Self-pay | Source: Home / Self Care | Attending: Internal Medicine

## 2019-09-12 ENCOUNTER — Encounter: Payer: Self-pay | Admitting: General Surgery

## 2019-09-12 ENCOUNTER — Other Ambulatory Visit (INDEPENDENT_AMBULATORY_CARE_PROVIDER_SITE_OTHER): Payer: Self-pay | Admitting: Vascular Surgery

## 2019-09-12 DIAGNOSIS — J449 Chronic obstructive pulmonary disease, unspecified: Secondary | ICD-10-CM

## 2019-09-12 DIAGNOSIS — J441 Chronic obstructive pulmonary disease with (acute) exacerbation: Secondary | ICD-10-CM

## 2019-09-12 DIAGNOSIS — K559 Vascular disorder of intestine, unspecified: Secondary | ICD-10-CM

## 2019-09-12 DIAGNOSIS — K55019 Acute (reversible) ischemia of small intestine, extent unspecified: Secondary | ICD-10-CM

## 2019-09-12 HISTORY — PX: VISCERAL ANGIOGRAPHY: CATH118276

## 2019-09-12 LAB — COMPREHENSIVE METABOLIC PANEL
ALT: 17 U/L (ref 0–44)
AST: 21 U/L (ref 15–41)
Albumin: 3.5 g/dL (ref 3.5–5.0)
Alkaline Phosphatase: 55 U/L (ref 38–126)
Anion gap: 8 (ref 5–15)
BUN: 18 mg/dL (ref 8–23)
CO2: 21 mmol/L — ABNORMAL LOW (ref 22–32)
Calcium: 8.2 mg/dL — ABNORMAL LOW (ref 8.9–10.3)
Chloride: 107 mmol/L (ref 98–111)
Creatinine, Ser: 1.06 mg/dL — ABNORMAL HIGH (ref 0.44–1.00)
GFR calc Af Amer: >60 mL/min (ref >60)
GFR calc non Af Amer: 54 mL/min — ABNORMAL LOW (ref >60)
Glucose, Bld: 207 mg/dL — ABNORMAL HIGH (ref 70–99)
Potassium: 4.3 mmol/L (ref 3.5–5.1)
Sodium: 136 mmol/L (ref 135–145)
Total Bilirubin: 1 mg/dL (ref 0.3–1.2)
Total Protein: 6.1 g/dL — ABNORMAL LOW (ref 6.5–8.1)

## 2019-09-12 LAB — LACTIC ACID, PLASMA: Lactic Acid, Venous: 2.5 mmol/L (ref 0.5–1.9)

## 2019-09-12 LAB — CBC
HCT: 40.1 % (ref 36.0–46.0)
Hemoglobin: 12.8 g/dL (ref 12.0–15.0)
MCH: 30 pg (ref 26.0–34.0)
MCHC: 31.9 g/dL (ref 30.0–36.0)
MCV: 94.1 fL (ref 80.0–100.0)
Platelets: 343 10*3/uL (ref 150–400)
RBC: 4.26 MIL/uL (ref 3.87–5.11)
RDW: 14.1 % (ref 11.5–15.5)
WBC: 14.3 10*3/uL — ABNORMAL HIGH (ref 4.0–10.5)
nRBC: 0 % (ref 0.0–0.2)

## 2019-09-12 LAB — APTT
aPTT: 26 seconds (ref 24–36)
aPTT: 31 seconds (ref 24–36)
aPTT: 39 seconds — ABNORMAL HIGH (ref 24–36)

## 2019-09-12 LAB — BLOOD GAS, ARTERIAL
Acid-base deficit: 4.4 mmol/L — ABNORMAL HIGH (ref 0.0–2.0)
Bicarbonate: 21 mmol/L (ref 20.0–28.0)
Delivery systems: POSITIVE
FIO2: 0.36
O2 Saturation: 94.9 %
Patient temperature: 37
pCO2 arterial: 39 mmHg (ref 32.0–48.0)
pH, Arterial: 7.34 — ABNORMAL LOW (ref 7.350–7.450)
pO2, Arterial: 80 mmHg — ABNORMAL LOW (ref 83.0–108.0)

## 2019-09-12 LAB — MAGNESIUM: Magnesium: 1.9 mg/dL (ref 1.7–2.4)

## 2019-09-12 LAB — PHOSPHORUS: Phosphorus: 3.2 mg/dL (ref 2.5–4.6)

## 2019-09-12 LAB — PROCALCITONIN: Procalcitonin: 4.01 ng/mL

## 2019-09-12 LAB — GLUCOSE, CAPILLARY: Glucose-Capillary: 163 mg/dL — ABNORMAL HIGH (ref 70–99)

## 2019-09-12 LAB — HEPARIN LEVEL (UNFRACTIONATED): Heparin Unfractionated: 1.65 IU/mL — ABNORMAL HIGH (ref 0.30–0.70)

## 2019-09-12 LAB — MRSA PCR SCREENING: MRSA by PCR: NEGATIVE

## 2019-09-12 SURGERY — VISCERAL ANGIOGRAPHY
Anesthesia: Moderate Sedation

## 2019-09-12 MED ORDER — SUGAMMADEX SODIUM 500 MG/5ML IV SOLN
INTRAVENOUS | Status: DC | PRN
  Administered 2019-09-12: 270 mg via INTRAVENOUS

## 2019-09-12 MED ORDER — FENTANYL CITRATE (PF) 100 MCG/2ML IJ SOLN
50.0000 ug | INTRAMUSCULAR | Status: DC | PRN
  Administered 2019-09-12 – 2019-09-15 (×10): 50 ug via INTRAVENOUS
  Filled 2019-09-12 (×10): qty 2

## 2019-09-12 MED ORDER — FENTANYL CITRATE (PF) 100 MCG/2ML IJ SOLN
INTRAMUSCULAR | Status: AC
  Administered 2019-09-12: 25 ug via INTRAVENOUS
  Filled 2019-09-12: qty 2

## 2019-09-12 MED ORDER — HEPARIN SODIUM (PORCINE) 1000 UNIT/ML IJ SOLN
INTRAMUSCULAR | Status: DC | PRN
  Administered 2019-09-12: 3000 [IU] via INTRAVENOUS

## 2019-09-12 MED ORDER — CHLORHEXIDINE GLUCONATE CLOTH 2 % EX PADS
6.0000 | MEDICATED_PAD | Freq: Every day | CUTANEOUS | Status: DC
  Administered 2019-09-12 – 2019-09-14 (×3): 6 via TOPICAL

## 2019-09-12 MED ORDER — ONDANSETRON HCL 4 MG/2ML IJ SOLN
4.0000 mg | Freq: Four times a day (QID) | INTRAMUSCULAR | Status: DC | PRN
  Administered 2019-09-13 – 2019-09-20 (×8): 4 mg via INTRAVENOUS
  Filled 2019-09-12 (×8): qty 2

## 2019-09-12 MED ORDER — MIDAZOLAM HCL 2 MG/ML PO SYRP
8.0000 mg | ORAL_SOLUTION | Freq: Once | ORAL | Status: DC | PRN
  Filled 2019-09-12: qty 4

## 2019-09-12 MED ORDER — CLINDAMYCIN PHOSPHATE 300 MG/50ML IV SOLN
300.0000 mg | Freq: Once | INTRAVENOUS | Status: DC
  Filled 2019-09-12: qty 50

## 2019-09-12 MED ORDER — HYDROMORPHONE HCL 1 MG/ML IJ SOLN
INTRAMUSCULAR | Status: AC
  Filled 2019-09-12: qty 1

## 2019-09-12 MED ORDER — IPRATROPIUM-ALBUTEROL 0.5-2.5 (3) MG/3ML IN SOLN
3.0000 mL | Freq: Once | RESPIRATORY_TRACT | Status: AC
  Administered 2019-09-12: 01:00:00 3 mL via RESPIRATORY_TRACT

## 2019-09-12 MED ORDER — FENTANYL CITRATE (PF) 100 MCG/2ML IJ SOLN
INTRAMUSCULAR | Status: DC | PRN
  Administered 2019-09-12: 50 ug via INTRAVENOUS

## 2019-09-12 MED ORDER — MIDAZOLAM HCL 2 MG/2ML IJ SOLN
INTRAMUSCULAR | Status: DC | PRN
  Administered 2019-09-12: 2 mg via INTRAVENOUS

## 2019-09-12 MED ORDER — ONDANSETRON HCL 4 MG/2ML IJ SOLN
INTRAMUSCULAR | Status: DC | PRN
  Administered 2019-09-12: 4 mg via INTRAVENOUS

## 2019-09-12 MED ORDER — CLINDAMYCIN PHOSPHATE 300 MG/50ML IV SOLN
INTRAVENOUS | Status: AC
  Filled 2019-09-12: qty 50

## 2019-09-12 MED ORDER — HEPARIN (PORCINE) 25000 UT/250ML-% IV SOLN
1300.0000 [IU]/h | INTRAVENOUS | Status: DC
  Administered 2019-09-12: 1000 [IU]/h via INTRAVENOUS
  Filled 2019-09-12: qty 250

## 2019-09-12 MED ORDER — HYDROMORPHONE HCL 1 MG/ML IJ SOLN
1.0000 mg | Freq: Once | INTRAMUSCULAR | Status: AC | PRN
  Administered 2019-09-19: 1 mg via INTRAVENOUS

## 2019-09-12 MED ORDER — METHYLPREDNISOLONE SODIUM SUCC 125 MG IJ SOLR: 125.0000 mg | Freq: Once | INTRAMUSCULAR | Status: DC | PRN

## 2019-09-12 MED ORDER — FAMOTIDINE 20 MG PO TABS: 40.0000 mg | ORAL_TABLET | Freq: Once | ORAL | Status: DC | PRN

## 2019-09-12 MED ORDER — HEPARIN BOLUS VIA INFUSION
2500.0000 [IU] | Freq: Once | INTRAVENOUS | Status: DC
  Filled 2019-09-12: qty 2500

## 2019-09-12 MED ORDER — IPRATROPIUM-ALBUTEROL 0.5-2.5 (3) MG/3ML IN SOLN
RESPIRATORY_TRACT | Status: AC
  Administered 2019-09-12: 3 mL via RESPIRATORY_TRACT
  Filled 2019-09-12: qty 3

## 2019-09-12 MED ORDER — PANTOPRAZOLE SODIUM 40 MG IV SOLR
40.0000 mg | Freq: Two times a day (BID) | INTRAVENOUS | Status: DC
  Administered 2019-09-12 – 2019-09-20 (×17): 40 mg via INTRAVENOUS
  Filled 2019-09-12 (×17): qty 40

## 2019-09-12 MED ORDER — FENTANYL CITRATE (PF) 100 MCG/2ML IJ SOLN
INTRAMUSCULAR | Status: AC
  Filled 2019-09-12: qty 2

## 2019-09-12 MED ORDER — FENTANYL CITRATE (PF) 100 MCG/2ML IJ SOLN
25.0000 ug | INTRAMUSCULAR | Status: AC | PRN
  Administered 2019-09-12 (×6): 25 ug via INTRAVENOUS

## 2019-09-12 MED ORDER — HEPARIN SODIUM (PORCINE) 1000 UNIT/ML IJ SOLN
INTRAMUSCULAR | Status: AC
  Filled 2019-09-12: qty 1

## 2019-09-12 MED ORDER — SODIUM CHLORIDE 0.9 % IV SOLN: INTRAVENOUS | Status: DC

## 2019-09-12 MED ORDER — MIDAZOLAM HCL 2 MG/2ML IJ SOLN
INTRAMUSCULAR | Status: AC
  Filled 2019-09-12: qty 2

## 2019-09-12 MED ORDER — IODIXANOL 320 MG/ML IV SOLN
INTRAVENOUS | Status: DC | PRN
  Administered 2019-09-12: 70 mL via INTRA_ARTERIAL

## 2019-09-12 MED ORDER — DEXMEDETOMIDINE HCL 200 MCG/2ML IV SOLN
INTRAVENOUS | Status: DC | PRN
  Administered 2019-09-12: 40 ug via INTRAVENOUS

## 2019-09-12 MED ORDER — DIPHENHYDRAMINE HCL 50 MG/ML IJ SOLN: 50.0000 mg | Freq: Once | INTRAMUSCULAR | Status: DC | PRN

## 2019-09-12 MED ORDER — IPRATROPIUM-ALBUTEROL 0.5-2.5 (3) MG/3ML IN SOLN: 3.0000 mL | RESPIRATORY_TRACT | Status: DC

## 2019-09-12 MED ORDER — ONDANSETRON HCL 4 MG/2ML IJ SOLN: 4.0000 mg | Freq: Once | INTRAMUSCULAR | Status: DC | PRN

## 2019-09-12 MED ORDER — HYDROMORPHONE HCL 1 MG/ML IJ SOLN
0.2500 mg | INTRAMUSCULAR | Status: DC | PRN
  Administered 2019-09-12: 0.25 mg via INTRAVENOUS

## 2019-09-12 SURGICAL SUPPLY — 10 items
CATH PIG 70CM (CATHETERS) ×1 IMPLANT
CATH VS15FR (CATHETERS) ×1 IMPLANT
COVER PROBE U/S 5X48 (MISCELLANEOUS) ×1 IMPLANT
DEVICE STARCLOSE SE CLOSURE (Vascular Products) ×1 IMPLANT
GLIDEWIRE STIFF .35X180X3 HYDR (WIRE) ×1 IMPLANT
PACK ANGIOGRAPHY (CUSTOM PROCEDURE TRAY) ×2 IMPLANT
SHEATH BRITE TIP 5FRX11 (SHEATH) ×2 IMPLANT
SYR MEDRAD MARK 7 150ML (SYRINGE) ×1 IMPLANT
TUBING CONTRAST HIGH PRESS 72 (TUBING) ×2 IMPLANT
WIRE J 3MM .035X145CM (WIRE) ×2 IMPLANT

## 2019-09-12 NOTE — Progress Notes (Signed)
Per Dr. Lucky Cowboy , heparin to restart at 5 pm same rate no bolus. Report given to [facility] RN in ICU

## 2019-09-12 NOTE — Op Note (Signed)
Preoperative diagnosis: Intestinal ischemia.   Postoperative diagnosis: Intestinal ischemia.  Procedure: Diagnostic laparoscopy                      Laparoscopic assisted small bowel and large bowel resection Negative pressure dressing placement    Anesthesia: GETA  Surgeon: Dr. Windell Moment, MD  Wound Classification: Contaminated  Indications: [name] with signs and symptoms of severe abdominal pain and a preoperative diagnosis of small bowel ischemia. Small and large bowel resection indicated for management of ischemia.   Findings: 1.  30 cm of small bowel ischemia just 5 cm proximal to the ileocecal valve 2.  250 mL of seropurulent fluid in the abdominal cavity 3.  No fecal contamination 4.  Adequate hemostasis  Description of procedure: The patient was placed in the supine position and general endotracheal anesthesia was induced. A timeout was completed verifying correct patient, procedure, site, positioning, and implant(s) and/or special equipment prior to beginning this procedure. Preoperative antibiotics were given. A Foley catheter and nasogastric tube were placed. The abdomen was prepped and draped in the usual sterile fashion.  A small supraumbilical incision was done.  Veress needle was inserted and abdominal cavity insufflated to 15 mmHg.  5 mm trocar was introduced using Optiview technique.  Wound amount of seropurulent fluid was seen on the whole abdominal cavity.  Another 5 mm trocar was inserted in the left lower quadrant and suprapubic area respectively under direct utilization.  The small bowel was inspected from the [place] and significant ischemia was identified in the ileum.  At this moment he was decided to proceed with intestinal resection that would include small bowel and due to the proximity of the ischemia to the ileocecal valve a full right colectomy was needed to be done.  With the LigaSure device large intestine was dissected from the line of  [place] from the lateral to medial approach.  A 6 cm incision was done in the midline supraumbilically and GelPort was placed.  With a hand-assisted technique the right colon was completely mobilized.  At this point the small bowel and right colon was able to be exteriorized through the midline incision. The segment of nonviable small bowel was 30 cm long. It ended 5 cm from the ileocecal valve.   A window was created by using a curved hemostat to separate the mesentery from the bowel at each resection margin. The mesentery was scored and serially divided with LigaSure device. The bowel was divided with a cutting linear stapler at each resection margin and passed off the table as a specimen.  After the sponge needle and instrument count was correct.  An Abthera temporary closure system was placed in steps.   The patient tolerated the procedure well and was taken to the postanesthesia care unit in stable condition.   Specimen: Ileum and right colon  Complications: None  Estimated Blood Loss: 50 mL

## 2019-09-12 NOTE — Progress Notes (Signed)
[facility] Daily Progress Note   Subjective: Patient s/p: Diagnostic laparoscopy Laparoscopic assisted small bowel and large bowel resection Negative pressure dressing placement    For ischemic bowel. CTA with possible SMA stenosis.   Patient with some incisional pain this AM.   Objective: Vitals:   09/12/19 0700 09/12/19 0750 09/12/19 0817 09/12/19 0920  BP: 104/80  109/74 109/64  Pulse: 95  82 92  Resp: 13  15 13   Temp:  98.1 F (36.7 C)    TempSrc:  Oral    SpO2:  95% 97% 99%  Weight:      Height:        Intake/Output Summary (Last 24 hours) at 09/12/2019 1044 Last data filed at 09/12/2019 0913 Gross per 24 hour  Intake 1804.7 ml  Output 1215 ml  Net 589.7 ml   Physical Exam: A&Ox3, NAD CV: Afib Pulmonary: CTA Bilaterally Abdomen: Soft, Nontender, Nondistended  OG Tube: in place, sumping  VAC: Intact to suction Vascular:   Bilateral lower extremities: soft, non-tender, warm distally to toes   Laboratory: CBC    Component Value Date/Time   WBC 14.3 (H) 09/12/2019 0449   HGB 12.8 09/12/2019 0449   HGB 13.2 01/28/2019 1028   HCT 40.1 09/12/2019 0449   HCT 38.6 01/28/2019 1028   PLT 343 09/12/2019 0449   PLT 350 01/28/2019 1028   BMET    Component Value Date/Time   NA 136 09/12/2019 0449   NA 136 01/28/2019 1028   NA 140 11/28/2013 2312   K 4.3 09/12/2019 0449   K 4.1 11/28/2013 2312   CL 107 09/12/2019 0449   CL 108 (H) 11/28/2013 2312   CO2 21 (L) 09/12/2019 0449   CO2 27 11/28/2013 2312   GLUCOSE 207 (H) 09/12/2019 0449   GLUCOSE 130 (H) 11/28/2013 2312   BUN 18 09/12/2019 0449   BUN 30 (H) 01/28/2019 1028   BUN 29 (H) 11/28/2013 2312   CREATININE 1.06 (H) 09/12/2019 0449   CREATININE 0.79 11/28/2013 2312   CALCIUM 8.2 (L) 09/12/2019 0449   CALCIUM 8.7 11/28/2013 2312   GFRNONAA 54 (L) 09/12/2019 0449   GFRNONAA >60 11/28/2013 2312   GFRAA >60 09/12/2019 0449   GFRAA >60 11/28/2013 2312    Assessment/Planning: The patient is a 68 year old female who presented with ischemic bowel no s/p resection  1) Will schedule mesenteric angiogram to assess mesentery, degree of stenosis if any and if appropriate revascularize the mesentery at that time 2) procedure, risks and benefits explained to the patient.  All questions answered.  The patient wishes to proceed. 3) we will plan on a mesenteric angiogram with Dr. Lucky Cowboy this afternoon  Discussed with Dr. Ellis Parents [facility] PA-C 09/12/2019 10:44 AM

## 2019-09-12 NOTE — Transfer of Care (Signed)
Immediate Anesthesia Transfer of Care Note  Patient: Laura [name]  Procedure(s) Performed: LAPAROSCOPY DIAGNOSTIC (N/A Abdomen) SMALL BOWEL RESECTION RIGHT COLECTOMY  Patient Location: PACU  Anesthesia Type:General  Level of Consciousness: drowsy and patient cooperative  Airway & Oxygen Therapy: Patient Spontanous Breathing and Patient connected to face mask oxygen  Post-op Assessment: Report given to RN and Post -op Vital signs reviewed and stable  Post vital signs: Reviewed and stable  Last Vitals:  Vitals Value Taken Time  BP 150/89 09/12/19 0025  Temp 37 C 09/12/19 0020  Pulse 37 09/12/19 0036  Resp 22 09/12/19 0028  SpO2 97 % 09/12/19 0036  Vitals shown include unvalidated device data.  Last Pain:  Vitals:   09/11/19 1725  TempSrc:   PainSc: 6          Complications: No apparent anesthesia complications

## 2019-09-12 NOTE — H&P (Signed)
[facility] History & Physical Update  The patient was interviewed and re-examined.  The patient's previous History and Physical has been reviewed and is unchanged.  There is no change in the plan of care. We plan to proceed with the scheduled procedure.  Leotis Pain, MD  09/12/2019, 2:41 PM

## 2019-09-12 NOTE — Progress Notes (Signed)
Pharmacy Antibiotic Note  Laura [name] is a 68 y.o. female admitted on 09/11/2019 with colitis. History significant for chronic atrial fibrillation, hypertension, pulmonary hypertension, COPD, peripheral artery disease. CT scan of abdomen and pelvis revealed focal small bowel enteritis. WBC and lactic acid have both increased and PCT = 4.01. Pharmacy has been consulted for Zosyn dosing.  Plan: Today is Day 2 of antibiotic therapy.   Continue Zosyn 3.375g IV q8h (4 hour infusion).    Height: 5\' 4"  (162.6 cm) Weight: 283 lb 1.1 oz (128.4 kg) IBW/kg (Calculated) : 54.7  Temp (24hrs), Avg:98.3 F (36.8 C), Min:97.1 F (36.2 C), Max:98.9 F (37.2 C)  Recent Labs  Lab 09/11/19 1041 09/11/19 1318 09/11/19 1711 09/12/19 0153 09/12/19 0449  WBC 11.8*  --   --   --  14.3*  CREATININE 0.93  --   --   --  1.06*  LATICACIDVEN  --  1.1 1.6 2.5*  --     Estimated Creatinine Clearance: 66.6 mL/min (A) (by C-G formula based on SCr of 1.06 mg/dL (H)).    Allergies  Allergen Reactions  . Flecainide Shortness Of Breath  . Propafenone Shortness Of Breath and Swelling  . Rivaroxaban Other (See Comments)    Antimicrobials this admission: Zosyn 09/20 >>   Dose adjustments this admission: n/a  Microbiology results: 09/20 Ova + Parasite Exam: pending 09/21 MRSA PCR: negative  Thank you for allowing pharmacy to be a part of this patient's care.  Fatemah Pourciau Dear Nicholes Mango, pharmacy student 09/12/2019 12:38 PM

## 2019-09-12 NOTE — Progress Notes (Signed)
[place] at [place] NAME: Laura [name]    MR#:  [id number]  DATE OF BIRTH:  07/31/1951  SUBJECTIVE:  CHIEF COMPLAINT:   Chief Complaint  Patient presents with   Abdominal Pain   Fall   Tachycardia   -Patient came in with severe abdominal pain and noted to have small bowel ischemia.  Status post terminal ileum and right colon resection last night. -Has a abdominal wound VAC, complains of abdominal pain.  On Cardizem drip and heparin drip for A. fib RVR.  REVIEW OF SYSTEMS:  Review of Systems  Constitutional: Positive for malaise/fatigue. Negative for chills and fever.  HENT: Negative for congestion, ear discharge, hearing loss and nosebleeds.   Eyes: Negative for blurred vision and double vision.  Respiratory: Negative for cough, shortness of breath and wheezing.   Cardiovascular: Negative for chest pain and palpitations.  Gastrointestinal: Positive for abdominal pain. Negative for constipation, diarrhea, nausea and vomiting.  Genitourinary: Negative for dysuria.  Musculoskeletal: Positive for myalgias.  Neurological: Positive for weakness. Negative for dizziness, focal weakness, seizures and headaches.  Psychiatric/Behavioral: Negative for depression.    DRUG ALLERGIES:   Allergies  Allergen Reactions   Flecainide Shortness Of Breath   Propafenone Shortness Of Breath and Swelling   Rivaroxaban Other (See Comments)    VITALS:  Blood pressure 130/63, pulse 96, temperature 98.2 F (36.8 C), temperature source Oral, resp. rate (!) 22, height 5\' 4"  (1.626 m), weight 128.4 kg, SpO2 95 %.  PHYSICAL EXAMINATION:  Physical Exam  GENERAL:  68 y.o.-year-old patient lying in the bed, ill-appearing.  EYES: Pupils equal, round, reactive to light and accommodation. No scleral icterus. Extraocular muscles intact.  HEENT: Head atraumatic, normocephalic. Oropharynx and nasopharynx clear.  NECK:  Supple, no jugular venous  distention. No thyroid enlargement, no tenderness.  LUNGS: Normal breath sounds bilaterally, no wheezing, rales,rhonchi or crepitation. No use of accessory muscles of respiration.  Decreased bibasilar breath sounds CARDIOVASCULAR: S1, S2 rapid rate and irregular rhythm. No  rubs, or gallops.  2/6 systolic murmur is present ABDOMEN: Mid abdominal suture with wound VAC in place, tender in the right lower quadrant.  Hypoactive bowel sounds present. No organomegaly or mass.  EXTREMITIES: No pedal edema, cyanosis, or clubbing.  NEUROLOGIC: Cranial nerves II through XII are intact. Muscle strength 5/5 in all extremities. Sensation intact. Gait not checked.  Global weakness noted PSYCHIATRIC: The patient is alert and oriented x 3.  SKIN: No obvious rash, lesion, or ulcer.    LABORATORY PANEL:   CBC Recent Labs  Lab 09/12/19 0449  WBC 14.3*  HGB 12.8  HCT 40.1  PLT 343   ------------------------------------------------------------------------------------------------------------------  Chemistries  Recent Labs  Lab 09/12/19 0449  NA 136  K 4.3  CL 107  CO2 21*  GLUCOSE 207*  BUN 18  CREATININE 1.06*  CALCIUM 8.2*  MG 1.9  AST 21  ALT 17  ALKPHOS 55  BILITOT 1.0   ------------------------------------------------------------------------------------------------------------------  Cardiac Enzymes No results for input(s): TROPONINI in the last 168 hours. ------------------------------------------------------------------------------------------------------------------  RADIOLOGY:  Ct Head Wo Contrast  Result Date: 09/11/2019 CLINICAL DATA:  Head trauma, patient on anticoagulation EXAM: CT HEAD WITHOUT CONTRAST TECHNIQUE: Contiguous axial images were obtained from the base of the skull through the vertex without intravenous contrast. COMPARISON:  09/13/2013 FINDINGS: Brain: No evidence of acute infarction, hemorrhage, extra-axial collection, ventriculomegaly, or mass effect.  Vascular: Cerebrovascular atherosclerotic calcifications are noted. Skull: Negative for fracture or focal lesion. Sinuses/Orbits: Visualized  portions of the orbits are unremarkable. Visualized portions of the paranasal sinuses and mastoid air cells are unremarkable. Other: None. IMPRESSION: No acute intracranial pathology. Electronically Signed   By: Kathreen Devoid   On: 09/11/2019 12:51   Ct Abdomen Pelvis W Contrast  Result Date: 09/11/2019 CLINICAL DATA:  Blunt abdominal trauma. Status post fall yesterday. Now with nausea, vomiting and body aches. EXAM: CT ABDOMEN AND PELVIS WITH CONTRAST TECHNIQUE: Multidetector CT imaging of the abdomen and pelvis was performed using the standard protocol following bolus administration of intravenous contrast. CONTRAST:  129mL OMNIPAQUE IOHEXOL 300 MG/ML  SOLN COMPARISON:  None. FINDINGS: Lower chest: No acute abnormality. Hepatobiliary: Small hypodense foci within the RIGHT liver lobe, too small to definitively characterize but most suggestive of benign cysts. No acute or suspicious findings within the liver. Gallbladder appears normal. No bile duct dilatation. Pancreas: Unremarkable. No pancreatic ductal dilatation or surrounding inflammatory changes. Spleen: Normal in size without focal abnormality. Adrenals/Urinary Tract: Adrenal glands appear normal. Kidneys appear normal without mass, stone or hydronephrosis. No ureteral or bladder calculi identified. Bladder is decompressed. Stomach/Bowel: Extensive diverticulosis of the LEFT colon but no focal inflammatory change to suggest acute diverticulitis. Edematous thickening of the distal small bowel (ileum) with associated fluid stranding/inflammation in the surrounding mesentery. No pneumatosis intestinalis appreciated. More proximal small bowel is normal in caliber. No other site of bowel wall thickening/inflammation. Stomach is unremarkable, partially decompressed. Appendix is normal. Vascular/Lymphatic: Aortic  atherosclerosis. No acute appearing vascular abnormality. No enlarged lymph nodes seen. Reproductive: Uterus and bilateral adnexa are unremarkable. Other: Small amount of free fluid in the lower abdomen and pelvis. No circumscribed collection or abscess like collection seen. No free intraperitoneal air. Musculoskeletal: No acute or suspicious osseous finding. Degenerative spondylosis of the thoracolumbar spine, mild to moderate in degree. IMPRESSION: 1. Pronounced edematous thickening of the walls of the distal small bowel (ileum) with associated fluid stranding/inflammation in the surrounding mesentery. Findings are consistent with a focal small bowel enteritis of infectious, inflammatory or ischemic nature. Given patient's diffuse aortic atherosclerosis, I FAVOR ISCHEMIC BOWEL. Next most likely differential would be a focal severe infectious enteritis. Given patient's age, Crohn's disease is considered less likely. 2. Small amount of free fluid in the lower abdomen and pelvis. No circumscribed collection or abscess like collection seen. No free intraperitoneal air. 3. Extensive diverticulosis of the LEFT colon without evidence of acute diverticulitis. 4. Aortic atherosclerosis. Aortic Atherosclerosis (ICD10-I70.0). Electronically Signed   By: Franki Cabot M.D.   On: 09/11/2019 12:54   Dg Chest Port 1 View  Result Date: 09/11/2019 CLINICAL DATA:  Fall, abdominal pain, tachycardia EXAM: PORTABLE CHEST 1 VIEW COMPARISON:  CTA chest dated 06/29/2016 FINDINGS: Lungs are clear.  No pleural effusion or pneumothorax. The heart is top-normal in size.  Thoracic aortic atherosclerosis. IMPRESSION: No evidence of acute cardiopulmonary disease. Thoracic aortic atherosclerosis. Electronically Signed   By: Julian Hy M.D.   On: 09/11/2019 11:45   Ct Angio Abd/pel W And/or Wo Contrast  Result Date: 09/11/2019 CLINICAL DATA:  Possible ischemic bowel EXAM: CTA ABDOMEN AND PELVIS WITH CONTRAST TECHNIQUE:  Multidetector CT imaging of the abdomen and pelvis was performed using the standard protocol during bolus administration of intravenous contrast. Multiplanar reconstructed images and MIPs were obtained and reviewed to evaluate the vascular anatomy. CONTRAST:  138mL OMNIPAQUE IOHEXOL 350 MG/ML SOLN COMPARISON:  Scan from earlier the same day FINDINGS: VASCULAR Aorta: Moderate calcified atheromatous plaque throughout. No aneurysm, dissection, or stenosis. Celiac: Calcified ostial plaque resulting  in short segment stenosis of at least mild severity. Patent distally. SMA: Calcified ostial plaque resulting in short segment stenosis of at least mild severity. Widely patent distally. Classic distal branch anatomy. Renals: Single left, widely patent. Single right, with ostial plaque resulting in only mild stenosis, widely patent distally. IMA: Patent, diminutive. Inflow: Calcified nonocclusive plaque through bilateral common iliac arteries. Internal and external iliac arteries unremarkable. Proximal Outflow: Bilateral common femoral and visualized portions of the superficial and profunda femoral arteries are patent without evidence of aneurysm, dissection, vasculitis or significant stenosis. Veins: Patent portal vein, SMV, splenic vein, bilateral renal veins. No venous pathology is evident. Delayed venous phase was demonstrated on the previous study. Review of the MIP images confirms the above findings. NON-VASCULAR Lower chest: Linear scarring or subsegmental atelectasis in the visualized lung bases. No pleural or pericardial effusion. Hepatobiliary: Stable low-attenuation right lobe lesions, statistically most likely cysts in the absence of a history of primary carcinoma, but incompletely characterized. Gallbladder physiologically distended. No biliary ductal dilatation. Pancreas: Unremarkable. No pancreatic ductal dilatation or surrounding inflammatory changes. Spleen: Normal in size without focal abnormality.  Adrenals/Urinary Tract: Normal adrenals. Stomach/Bowel: Stomach is decompressed. Small bowel is nondilated. Persistent segment of circumferential wall thickening in the distal ileum over length of at least 30 cm extending nearly to the terminal ileum. Inflammatory/edematous changes in the adjacent mesentery with small regional pockets of interloop ascites without evidence of loculation or peripheral enhancement. Ileocecal valve unremarkable. Appendix not well seen. The colon is nondilated with scattered diverticula from distal descending and sigmoid segment. Lymphatic: No abdominal or pelvic adenopathy. Reproductive: Uterus and bilateral adnexa are unremarkable. Other: Small volume right lower quadrant pelvic ascites as above. No free air. Musculoskeletal: Multilevel spondylitic changes in the lumbar spine. No fracture or worrisome bone lesion. IMPRESSION: 1. 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 If there is continued clinical concern of ischemic bowel, arteriogram with pressure measurements across the stenoses may be useful to better define their hemodynamic significance, as they may be approachable for percutaneous intervention if indicated. 2. Persistent circumferential wall thickening in the distal ileum with regional inflammatory/edematous changes and mesenteric fluid; no abscess. Inflammatory bowel disease, infectious/inflammatory enteritis, or ischemia could give this appearance. 3. Descending and sigmoid diverticulosis. Electronically Signed   By: Lucrezia Europe M.D.   On: 09/11/2019 15:58    EKG:   Orders placed or performed during the hospital encounter of 09/11/19   EKG 12-Lead   EKG 12-Lead   EKG 12-Lead   EKG 12-Lead   EKG 12-Lead   EKG 12-Lead   ED EKG   ED EKG    ASSESSMENT AND PLAN:   68 year old female with past medical history significant for chronic A. fib on sleep apnea, PAD, hypertension presents to  hospital secondary to abdominal pain and noted to have ischemic colitis.  1.  Ischemic colitis-appreciate general surgery consult and vascular consult -Patient had surgery last night with ileum and right colectomy, postop day 1 today -Vascular surgery to do mesenteric angiogram this afternoon -That will help improve the circulation to the anastomosis.  Patient does not have a colostomy. -Has an abdominal wound VAC in place. -Has an NG tube.  On IV antibiotics with Zosyn -Continue further management per surgical team  2.  Atrial fibrillation with RVR-since n.p.o., patient on Cardizem drip.  Also on heparin drip -Appreciate cardiology consult  3.  GERD-Protonix  4.  Sleep apnea-continue CPAP  5.  DVT  prophylaxis-patient already on heparin drip. eliquis held     All the records are reviewed and case discussed with Care Management/Social Workerr. Management plans discussed with the patient, family and they are in agreement.  CODE STATUS: Full code  TOTAL TIME TAKING CARE OF THIS PATIENT: 39 minutes.   POSSIBLE D/C IN 3-4 DAYS, DEPENDING ON CLINICAL CONDITION.   Gladstone Lighter M.D on 09/12/2019 at 1:19 PM  Between 7am to 6pm - Pager - [phone]  After 6pm go to [email or web address] - password [email or web address] [facility]  Office  [phone]  CC: Primary care physician; [name], MD

## 2019-09-12 NOTE — Consult Note (Signed)
PULMONARY / CRITICAL CARE MEDICINE  Name: Laura [name] MRN: [id number] DOB: 10/26/1951    LOS: 1  Referring Provider:  Dr Windell Moment Reason for Referral:   Intestinal ischemia S/P diagnostic laparoscopy with small bowel and large bowel resection and negative pressure dressing placement    Brief patient description: 68 year old female admitted with nausea, vomiting, atrial fibrillation and abdominal pain; CT abdomen suggested possible ischemia.  She was taken for an emergent laparoscopy with small and large bowel resection and wound VAC placement.  She was transferred to the ICU for close monitoring of her respiratory status  HPI: This is 68 year old female with a medical history as indicated below, currently on Eliquis for chronic atrial fibrillation, who presented to the ED with complaints of abdominal pain, nausea, vomiting, diarrhea and chills.  She described abdominal pain mostly as localized in the lower portion of her abdomen with the right side being greater than the left side.  At the ED patient was found to be in atrial fibrillation with heart rate in the 140s.  She was given IV Cardizem with improvement in heart rate.  Her CT abdomen and CT angiogram of the abdomen showed possible ischemic bowel.  She was taken for an emergent diagnostic laparoscopy and found to have extensive bowel ischemia and underwent small and large bowel resection with negative pressure dressing closure.  She was extubated to BiPAP and transferred to the ICU for further management.  She is awake and offers no complaints except for abdominal pain.  She has an NG tube that is draining greenish bile looking liquid.  She is still in atrial fibrillation with heart rate in the 110s.  She is on a heparin and diltiazem infusions and on Zosyn.  Past Medical History:  Diagnosis Date  . Asthma   . Chronic atrial fibrillation    a. diagnosed in 09/2016; b. failed flecainide and propafenone due to LE swelling and SOB,  could not afford Multaq; c. CHADS2VASc => 3 (HTN, age x 1, female); d. on Eliquis  . GERD (gastroesophageal reflux disease)   . History of stress test    a. Lexiscan Myoview 10/2016: no evidence of ischemia, EF 53%  . Hypertension   . Obesity   . Obstructive sleep apnea   . Pulmonary hypertension ([place])   . Systolic dysfunction    a. TTE 10/2016: EF 50%, mild LVH, moderately dilated LA, moderate MR/TR, mild pulmonary hypertension   Past Surgical History:  Procedure Laterality Date  . cataract surgery    . INCISION AND DRAINAGE ABSCESS Right 06/29/2016   Procedure: INCISION AND DRAINAGE ABSCESS;  Surgeon: Florene Glen, MD;  Location: [facility] ORS;  Service: General;  Laterality: Right;  . INCISION AND DRAINAGE OF WOUND Left 06/29/2016   Procedure: IRRIGATION AND DEBRIDEMENT WOUND;  Surgeon: Florene Glen, MD;  Location: [facility] ORS;  Service: General;  Laterality: Left;   No current facility-administered medications on file prior to encounter.    Current Outpatient Medications on File Prior to Encounter  Medication Sig  . albuterol (VENTOLIN HFA) 108 (90 Base) MCG/ACT inhaler Inhale 2 puffs into the lungs every 4 (four) hours as needed for wheezing or shortness of breath.  . ALPRAZolam (XANAX) 0.25 MG tablet Take 1 tablet (0.25 mg total) by mouth 2 (two) times daily.  Marland Kitchen apixaban (ELIQUIS) 5 MG TABS tablet Take 1 tablet (5 mg total) by mouth 2 (two) times daily.  Marland Kitchen dexlansoprazole (DEXILANT) 60 MG capsule Take 1 capsule (60 mg total) by mouth  daily.  . diltiazem (CARDIZEM CD) 360 MG 24 hr capsule TAKE 1 CAPSULE (360 MG TOTAL) BY MOUTH DAILY.  Marland Kitchen EPINEPHrine 0.3 mg/0.3 mL IJ SOAJ injection Inject 0.3 mg into the muscle as needed for anaphylaxis.   . furosemide (LASIX) 40 MG tablet Take 1 tablet (40 mg total) by mouth daily.  Marland Kitchen guaiFENesin (MUCINEX) 600 MG 12 hr tablet Take 600 mg by mouth 2 (two) times daily as needed for cough or to loosen phlegm.   Marland Kitchen ibuprofen (ADVIL,MOTRIN) 200 MG tablet  Take 400-800 mg by mouth every 6 (six) hours as needed for fever or mild pain.   Marland Kitchen ipratropium-albuterol (DUONEB) 0.5-2.5 (3) MG/3ML SOLN Take 3 mLs by nebulization every 4 (four) hours as needed. (Patient taking differently: Take 3 mLs by nebulization every 4 (four) hours as needed (shortness of breath / wheezing). )  . levalbuterol (XOPENEX) 0.31 MG/3ML nebulizer solution Take 3 mLs by nebulization every 4 (four) hours as needed. For wheezing/ and or shortness of breath.  . losartan (COZAAR) 25 MG tablet Take 1 tablet (25 mg total) by mouth daily.  . montelukast (SINGULAIR) 10 MG tablet TAKE 1 TABLET BY MOUTH EVERY DAY (Patient taking differently: Take 10 mg by mouth at bedtime. )  . Multiple Vitamin (MULTIVITAMIN) tablet Take 1 tablet by mouth daily.  Marland Kitchen nystatin (MYCOSTATIN) 100000 UNIT/ML suspension Take 5 mLs (500,000 Units total) by mouth 4 (four) times daily. (Patient taking differently: Take 5 mLs by mouth 4 (four) times daily as needed. )  . ondansetron (ZOFRAN-ODT) 8 MG disintegrating tablet Take 1 tablet (8 mg total) by mouth every 8 (eight) hours as needed for nausea or vomiting.  . predniSONE (DELTASONE) 10 MG tablet Take 1 tablet (10 mg total) by mouth daily with breakfast.  . Probiotic Product (PROBIOTIC-10) CAPS Take 1 capsule by mouth daily.   Marland Kitchen spironolactone (ALDACTONE) 25 MG tablet Take 1 tablet (25 mg total) by mouth 2 (two) times daily.  . theophylline (THEO-24) 300 MG 24 hr capsule Take 1 capsule (300 mg total) by mouth daily.  . pantoprazole (PROTONIX) 40 MG tablet Take 1 tablet (40 mg total) by mouth daily. (Patient not taking: Reported on 09/11/2019)  . phentermine (ADIPEX-P) 37.5 MG tablet Take 1 tablet (37.5 mg total) by mouth daily before breakfast. (Patient not taking: Reported on 09/11/2019)    Allergies Allergies  Allergen Reactions  . Flecainide Shortness Of Breath  . Propafenone Shortness Of Breath and Swelling  . Rivaroxaban Other (See Comments)    Family  History Family History  Problem Relation Age of Onset  . Dementia Mother   . Osteoporosis Mother   . Vascular Disease Mother   . COPD Father    Social History  reports that she has never smoked. She has never used smokeless tobacco. She reports that she does not drink alcohol or use drugs.  Review Of Systems:  Constitutional: Complaining of generalized malaise HENT: Negative for congestion and rhinorrhea.  Eyes: Negative for redness and visual disturbance.  Respiratory: Negative for shortness of breath and wheezing.  Cardiovascular: Negative for chest pain and palpitations.  Gastrointestinal: Positive for nausea , vomiting and abdominal pain and  loose stools Genitourinary: Negative for dysuria and urgency.  Endocrine: Denies polyuria, polyphagia and heat intolerance Musculoskeletal: Negative for myalgias and arthralgias.  Skin: Negative for pallor and wound.  Neurological: Negative for dizziness and headaches   VITAL SIGNS: BP 124/66   Pulse (!) 111   Temp 98.6 F (37 C) (Oral)  Resp 16   Ht 5\' 4"  (1.626 m)   Wt 128.4 kg   SpO2 97%   BMI 48.59 kg/m   HEMODYNAMICS:    VENTILATOR SETTINGS:    INTAKE / OUTPUT: No intake/output data recorded.  PHYSICAL EXAMINATION: General: Obese, in moderate distress HEENT: PERRLA, trachea midline, no JVD Neuro: Alert and oriented x3, no focal deficits Cardiovascular: Apical pulse irregular, S1-S2, no murmur regurg or gallop, +2 pulses bilaterally Lungs: Clear to auscultation bilaterally with diminished breath sounds in the bases Abdomen: Obese, surgical incision clean with wound VAC in place, no bowel sounds heard Musculoskeletal: Positive range of motion in upper and lower extremities, no joint deformities Skin: Warm and dry  LABS:  BMET Recent Labs  Lab 09/11/19 1041  NA 135  K 3.9  CL 101  CO2 26  BUN 16  CREATININE 0.93  GLUCOSE 108*    Electrolytes Recent Labs  Lab 09/11/19 1041  CALCIUM 8.3*     CBC Recent Labs  Lab 09/11/19 1041  WBC 11.8*  HGB 12.8  HCT 39.3  PLT 296    Coag's Recent Labs  Lab 09/11/19 1711 09/12/19 0153  APTT 25 26  INR 1.2  --     Sepsis Markers Recent Labs  Lab 09/11/19 1318 09/11/19 1711 09/12/19 0153  LATICACIDVEN 1.1 1.6 2.5*    ABG Recent Labs  Lab 09/12/19 0045  PHART 7.34*  PCO2ART 39  PO2ART 80*    Liver Enzymes Recent Labs  Lab 09/11/19 1041  AST 24  ALT 18  ALKPHOS 56  BILITOT 1.0  ALBUMIN 3.3*    Cardiac Enzymes No results for input(s): TROPONINI, PROBNP in the last 168 hours.  Glucose Recent Labs  Lab 09/12/19 0149  GLUCAP 163*    Imaging Ct Head Wo Contrast  Result Date: 09/11/2019 CLINICAL DATA:  Head trauma, patient on anticoagulation EXAM: CT HEAD WITHOUT CONTRAST TECHNIQUE: Contiguous axial images were obtained from the base of the skull through the vertex without intravenous contrast. COMPARISON:  09/13/2013 FINDINGS: Brain: No evidence of acute infarction, hemorrhage, extra-axial collection, ventriculomegaly, or mass effect. Vascular: Cerebrovascular atherosclerotic calcifications are noted. Skull: Negative for fracture or focal lesion. Sinuses/Orbits: Visualized portions of the orbits are unremarkable. Visualized portions of the paranasal sinuses and mastoid air cells are unremarkable. Other: None. IMPRESSION: No acute intracranial pathology. Electronically Signed   By: Kathreen Devoid   On: 09/11/2019 12:51   Ct Abdomen Pelvis W Contrast  Result Date: 09/11/2019 CLINICAL DATA:  Blunt abdominal trauma. Status post fall yesterday. Now with nausea, vomiting and body aches. EXAM: CT ABDOMEN AND PELVIS WITH CONTRAST TECHNIQUE: Multidetector CT imaging of the abdomen and pelvis was performed using the standard protocol following bolus administration of intravenous contrast. CONTRAST:  149mL OMNIPAQUE IOHEXOL 300 MG/ML  SOLN COMPARISON:  None. FINDINGS: Lower chest: No acute abnormality. Hepatobiliary:  Small hypodense foci within the RIGHT liver lobe, too small to definitively characterize but most suggestive of benign cysts. No acute or suspicious findings within the liver. Gallbladder appears normal. No bile duct dilatation. Pancreas: Unremarkable. No pancreatic ductal dilatation or surrounding inflammatory changes. Spleen: Normal in size without focal abnormality. Adrenals/Urinary Tract: Adrenal glands appear normal. Kidneys appear normal without mass, stone or hydronephrosis. No ureteral or bladder calculi identified. Bladder is decompressed. Stomach/Bowel: Extensive diverticulosis of the LEFT colon but no focal inflammatory change to suggest acute diverticulitis. Edematous thickening of the distal small bowel (ileum) with associated fluid stranding/inflammation in the surrounding mesentery. No pneumatosis intestinalis appreciated. More  proximal small bowel is normal in caliber. No other site of bowel wall thickening/inflammation. Stomach is unremarkable, partially decompressed. Appendix is normal. Vascular/Lymphatic: Aortic atherosclerosis. No acute appearing vascular abnormality. No enlarged lymph nodes seen. Reproductive: Uterus and bilateral adnexa are unremarkable. Other: Small amount of free fluid in the lower abdomen and pelvis. No circumscribed collection or abscess like collection seen. No free intraperitoneal air. Musculoskeletal: No acute or suspicious osseous finding. Degenerative spondylosis of the thoracolumbar spine, mild to moderate in degree. IMPRESSION: 1. Pronounced edematous thickening of the walls of the distal small bowel (ileum) with associated fluid stranding/inflammation in the surrounding mesentery. Findings are consistent with a focal small bowel enteritis of infectious, inflammatory or ischemic nature. Given patient's diffuse aortic atherosclerosis, I FAVOR ISCHEMIC BOWEL. Next most likely differential would be a focal severe infectious enteritis. Given patient's age, Crohn's  disease is considered less likely. 2. Small amount of free fluid in the lower abdomen and pelvis. No circumscribed collection or abscess like collection seen. No free intraperitoneal air. 3. Extensive diverticulosis of the LEFT colon without evidence of acute diverticulitis. 4. Aortic atherosclerosis. Aortic Atherosclerosis (ICD10-I70.0). Electronically Signed   By: Franki Cabot M.D.   On: 09/11/2019 12:54   Dg Chest Port 1 View  Result Date: 09/11/2019 CLINICAL DATA:  Fall, abdominal pain, tachycardia EXAM: PORTABLE CHEST 1 VIEW COMPARISON:  CTA chest dated 06/29/2016 FINDINGS: Lungs are clear.  No pleural effusion or pneumothorax. The heart is top-normal in size.  Thoracic aortic atherosclerosis. IMPRESSION: No evidence of acute cardiopulmonary disease. Thoracic aortic atherosclerosis. Electronically Signed   By: Julian Hy M.D.   On: 09/11/2019 11:45   Ct Angio Abd/pel W And/or Wo Contrast  Result Date: 09/11/2019 CLINICAL DATA:  Possible ischemic bowel EXAM: CTA ABDOMEN AND PELVIS WITH CONTRAST TECHNIQUE: Multidetector CT imaging of the abdomen and pelvis was performed using the standard protocol during bolus administration of intravenous contrast. Multiplanar reconstructed images and MIPs were obtained and reviewed to evaluate the vascular anatomy. CONTRAST:  175mL OMNIPAQUE IOHEXOL 350 MG/ML SOLN COMPARISON:  Scan from earlier the same day FINDINGS: VASCULAR Aorta: Moderate calcified atheromatous plaque throughout. No aneurysm, dissection, or stenosis. Celiac: Calcified ostial plaque resulting in short segment stenosis of at least mild severity. Patent distally. SMA: Calcified ostial plaque resulting in short segment stenosis of at least mild severity. Widely patent distally. Classic distal branch anatomy. Renals: Single left, widely patent. Single right, with ostial plaque resulting in only mild stenosis, widely patent distally. IMA: Patent, diminutive. Inflow: Calcified nonocclusive plaque  through bilateral common iliac arteries. Internal and external iliac arteries unremarkable. Proximal Outflow: Bilateral common femoral and visualized portions of the superficial and profunda femoral arteries are patent without evidence of aneurysm, dissection, vasculitis or significant stenosis. Veins: Patent portal vein, SMV, splenic vein, bilateral renal veins. No venous pathology is evident. Delayed venous phase was demonstrated on the previous study. Review of the MIP images confirms the above findings. NON-VASCULAR Lower chest: Linear scarring or subsegmental atelectasis in the visualized lung bases. No pleural or pericardial effusion. Hepatobiliary: Stable low-attenuation right lobe lesions, statistically most likely cysts in the absence of a history of primary carcinoma, but incompletely characterized. Gallbladder physiologically distended. No biliary ductal dilatation. Pancreas: Unremarkable. No pancreatic ductal dilatation or surrounding inflammatory changes. Spleen: Normal in size without focal abnormality. Adrenals/Urinary Tract: Normal adrenals. Stomach/Bowel: Stomach is decompressed. Small bowel is nondilated. Persistent segment of circumferential wall thickening in the distal ileum over length of at least 30 cm extending nearly to the  terminal ileum. Inflammatory/edematous changes in the adjacent mesentery with small regional pockets of interloop ascites without evidence of loculation or peripheral enhancement. Ileocecal valve unremarkable. Appendix not well seen. The colon is nondilated with scattered diverticula from distal descending and sigmoid segment. Lymphatic: No abdominal or pelvic adenopathy. Reproductive: Uterus and bilateral adnexa are unremarkable. Other: Small volume right lower quadrant pelvic ascites as above. No free air. Musculoskeletal: Multilevel spondylitic changes in the lumbar spine. No fracture or worrisome bone lesion. IMPRESSION: 1. 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 If there is continued clinical concern of ischemic bowel, arteriogram with pressure measurements across the stenoses may be useful to better define their hemodynamic significance, as they may be approachable for percutaneous intervention if indicated. 2. Persistent circumferential wall thickening in the distal ileum with regional inflammatory/edematous changes and mesenteric fluid; no abscess. Inflammatory bowel disease, infectious/inflammatory enteritis, or ischemia could give this appearance. 3. Descending and sigmoid diverticulosis. Electronically Signed   By: Lucrezia Europe M.D.   On: 09/11/2019 15:58   STUDIES:  None  CULTURES: None  ANTIBIOTICS: Zosyn  SIGNIFICANT EVENTS: 09/11/2019: Admitted  LINES/TUBES: Peripheral  DISCUSSION: 68 year old female presenting with acute bowel ischemia now status post diagnostic laparoscopy with bowel resection  ASSESSMENT  Intestinal ischemia status post diagnostic laparoscopy with small bowel and large bowel resection and wound VAC placement Atrial fibrillation with rapid ventricular rate COPD Obstructive sleep apnea Pulmonary hypertension Hypertension   PLAN Hemodynamic monitoring per ICU protocol Mandatory nocturnal BiPAP or CPAP ABG and chest x-ray as needed Continue diltiazem infusion to maintain heart rate less than 100 bpm Heparin infusion per vascular surgery and general surgery Monitor wound VAC output and notify surgery Antibiotics as above for intra-abdominal infection prophylaxis NG tube to low intermittent suction Keep n.p.o. until advised by surgery Cardiology, general surgery and vascular following Nebulized bronchodilators Best Practice: Code Status: Full code Diet: N.p.o. GI prophylaxis: Protonix VTE prophylaxis: SCDs and heparin infusion  FAMILY  - Updates: Patient and family updated on current treatment plan by general surgery and the admitting  service  Princella Jaskiewicz S. Tukov-Yual ANP-BC Pulmonary and [place] Pager [phone] or [phone]  NB: This document was prepared using Dragon voice recognition software and may include unintentional dictation errors.    09/12/2019, 5:07 AM

## 2019-09-12 NOTE — Anesthesia Preprocedure Evaluation (Addendum)
Anesthesia Evaluation  Patient identified by MRN, date of birth, ID band Patient awake    Reviewed: Allergy & Precautions, H&P , NPO status , Patient's Chart, lab work & pertinent test results, reviewed documented beta blocker date and time   Airway Mallampati: II  TM Distance: >3 FB Neck ROM: full    Dental  (+) Teeth Intact   Pulmonary asthma , sleep apnea , COPD,    Pulmonary exam normal        Cardiovascular Exercise Tolerance: Poor hypertension, On Medications negative cardio ROS  Atrial Fibrillation  Rhythm:regular Rate:Normal     Neuro/Psych PSYCHIATRIC DISORDERS Anxiety negative neurological ROS     GI/Hepatic Neg liver ROS, GERD  Medicated,  Endo/Other  negative endocrine ROS  Renal/GU Renal disease  negative genitourinary   Musculoskeletal   Abdominal   Peds  Hematology negative hematology ROS (+)   Anesthesia Other Findings Past Medical History: No date: Asthma No date: Chronic atrial fibrillation     Comment:  a. diagnosed in 09/2016; b. failed flecainide and               propafenone due to LE swelling and SOB, could not afford               Multaq; c. CHADS2VASc => 3 (HTN, age x 1, female); d. on               Eliquis No date: GERD (gastroesophageal reflux disease) No date: History of stress test     Comment:  a. [place] 10/2016: no evidence of ischemia, EF              53% No date: Hypertension No date: Obesity No date: Obstructive sleep apnea No date: Pulmonary hypertension ([place]) No date: Systolic dysfunction     Comment:  a. TTE 10/2016: EF 50%, mild LVH, moderately dilated LA,              moderate MR/TR, mild pulmonary hypertension   Reproductive/Obstetrics negative OB ROS                            Anesthesia Physical Anesthesia Plan  ASA: IV and emergent  Anesthesia Plan: General ETT   Post-op Pain Management:    Induction:   PONV Risk  Score and Plan: 4 or greater  Airway Management Planned:   Additional Equipment:   Intra-op Plan:   Post-operative Plan:   Informed Consent: I have reviewed the patients History and Physical, chart, labs and discussed the procedure including the risks, benefits and alternatives for the proposed anesthesia with the patient or authorized representative who has indicated his/her understanding and acceptance.     Dental Advisory Given  Plan Discussed with: CRNA  Anesthesia Plan Comments:         Anesthesia Quick Evaluation

## 2019-09-12 NOTE — Progress Notes (Signed)
[place] Hospital Day(s): 1.   Post op day(s): 1 Day Post-Op.   Interval History: Patient seen and examined, no acute events or new complaints overnight. Patient reports feeling better this morning.  She does report that she does not recall that she had surgery last night.  She is resting comfortably with the CPAP machine.  Vital signs has been stable.  Pain better controlled this morning than last night.  Denies significant pain at this moment.  There is no pain radiation.  Vital signs in last 24 hours: [min-max] current  Temp:  [97.1 F (36.2 C)-98.9 F (37.2 C)] 98.2 F (36.8 C) (09/21 1133) Pulse Rate:  [81-141] 81 (09/21 1133) Resp:  [10-26] 15 (09/21 1133) BP: (104-150)/(55-95) 109/64 (09/21 0920) SpO2:  [92 %-100 %] 99 % (09/21 1133) Weight:  [128.4 kg-129.6 kg] 128.4 kg (09/21 0153)     Height: 5\' 4"  (162.6 cm) Weight: 128.4 kg BMI (Calculated): 48.57   Physical Exam:  Constitutional: alert, cooperative and no distress  Respiratory: breathing non-labored at rest  Cardiovascular: irregular rate and rhythm.  Gastrointestinal: soft, mild-tender, and non-distended.  Negative pressure dressing in place  Labs:  CBC Latest Ref Rng & Units 09/12/2019 09/11/2019 01/28/2019  WBC 4.0 - 10.5 K/uL 14.3(H) 11.8(H) 12.7(H)  Hemoglobin 12.0 - 15.0 g/dL 12.8 12.8 13.2  Hematocrit 36.0 - 46.0 % 40.1 39.3 38.6  Platelets 150 - 400 K/uL 343 296 350   CMP Latest Ref Rng & Units 09/12/2019 09/11/2019 01/28/2019  Glucose 70 - 99 mg/dL 207(H) 108(H) 75  BUN 8 - 23 mg/dL 18 16 30(H)  Creatinine 0.44 - 1.00 mg/dL 1.06(H) 0.93 1.15(H)  Sodium 135 - 145 mmol/L 136 135 136  Potassium 3.5 - 5.1 mmol/L 4.3 3.9 5.0  Chloride 98 - 111 mmol/L 107 101 101  CO2 22 - 32 mmol/L 21(L) 26 21  Calcium 8.9 - 10.3 mg/dL 8.2(L) 8.3(L) 9.5  Total Protein 6.5 - 8.1 g/dL 6.1(L) 6.0(L) -  Total Bilirubin 0.3 - 1.2 mg/dL 1.0 1.0 -  Alkaline Phos 38 - 126 U/L 55 56 -  AST 15 - 41 U/L 21 24 -  ALT 0 -  44 U/L 17 18 -    Imaging studies: No new pertinent imaging studies   Assessment/Plan:  68 y.o. female with distal ileum ischemia s/p small bowel and right colectomy postoperative day #1, complicated by pertinent comorbidities including atria fibrillation on anticoagulation, Pulmonary hypertension, COPD, sleep apnea, morbid obesity, hypertension. Patient has been tolerating the procedure well.  Recovering slowly.  This morning with better pain control.  She is still with significant need of BiPAP for assistant of respiration due to COPD and pulmonary hypertension.  Patient hemoglobin stable.  During her sign of bleeding.  But is and has been stable without fever and no hypotension.  Patient continue with regular rate and rhythm.  I appreciate ICU physician and cardiology assistance.  The patient does not have anastomosis at the moment.  She needs NGT to low intermittent suction.  Expect to perform a second look tomorrow and possible anastomosis of the intestine.  Today she will be taken by vascular surgery for angiography and possible intervention if revascularization needed.  This will be of great help to improve the circulation for the anastomosis tomorrow or at least to ensure that the circulation is adequate before performing the anastomosis.  I called husband and left a voicemail updating about patient's status.  We will continue with IV fluids for hydration  and IV antibiotic therapy.  Patient currently on therapeutic anticoagulation due to the A. fib.  Patient also with sequential compression device.  Arnold Long, MD

## 2019-09-12 NOTE — Anesthesia Postprocedure Evaluation (Deleted)
Anesthesia Post Note  Patient: Laura [name]  Procedure(s) Performed: LAPAROSCOPY DIAGNOSTIC (N/A Abdomen) SMALL BOWEL RESECTION RIGHT COLECTOMY  Anesthesia Type: General     Last Vitals:  Vitals:   09/12/19 0500 09/12/19 0600  BP:  112/67  Pulse: 99 89  Resp: 20 15  Temp:    SpO2: 99% 99%    Last Pain:  Vitals:   09/12/19 0646  TempSrc:   PainSc: 8                  Nycere Presley,  Clearnce Sorrel

## 2019-09-12 NOTE — Consult Note (Signed)
[facility] NOTE   Pharmacy Consult for: Heparin Indication: Suspicion of ischemic bowel.  Allergies  Allergen Reactions  . Flecainide Shortness Of Breath  . Propafenone Shortness Of Breath and Swelling  . Rivaroxaban Other (See Comments)    Patient Measurements: Height: 5\' 4"  (162.6 cm) Weight: 283 lb 1.1 oz (128.4 kg) IBW/kg (Calculated) : 54.7 Heparin Dosing Weight: 86.4 kg   Vital Signs: Temp: 98.2 F (36.8 C) (09/21 1133) Temp Source: Oral (09/21 1133) BP: 109/64 (09/21 0920) Pulse Rate: 81 (09/21 1133)  Labs: Recent Labs    09/11/19 1041 09/11/19 1711 09/12/19 0153 09/12/19 0449  HGB 12.8  --   --  12.8  HCT 39.3  --   --  40.1  PLT 296  --   --  343  APTT  --  25 26  --   LABPROT  --  15.4*  --   --   INR  --  1.2  --   --   HEPARINUNFRC  --  2.62*  --  1.65*  CREATININE 0.93  --   --  1.06*  TROPONINIHS 9  --   --   --     Estimated Creatinine Clearance: 66.6 mL/min (A) (by C-G formula based on SCr of 1.06 mg/dL (H)).  Medications:  Eliquis 5 mg BID- last known dose 09/10/19 @ 2000  Assessment: Patient has a PMH significant for atrial fibrillation (on Eliquis), pulmonary HTN, PAD who presents with complaints of abdominal pain, N/V, and diarrhea. Per MD, bolus was deferred. For now, will base levels on aPTT until levels correlate with HL. H/H and platelets are stable.   09/21 1258 aPTT = 31 sec Confirmed with RN that heparin drip has not been stopped.  Goal of Therapy:  Heparin level 0.3-0.7 units/ml aPTT 66-102s seconds Monitor platelets by anticoagulation protocol: Yes   Plan:  Patient currently in surgery, will defer bolus at this time. When patient comes back, increase heparin rate to 1300 units/hr. Check aPTT 6 hours after rate increase.  Per General surgery MD, hold heparin drip at 0922 0600 for anticipated surgery at noon.  Gardy Montanari Dear Nicholes Mango, pharmacy student 09/12/2019,12:47 PM

## 2019-09-12 NOTE — Progress Notes (Signed)
Progress Note  Patient Name: Laura [name] Date of Encounter: 09/12/2019  Primary Cardiologist: Kathlyn Sacramento, MD   Subjective   The patient continued to have severe abdominal pain yesterday and underwent diagnostic laparoscopy which showed small bowel ischemia.  She underwent small and large  bowel partial resection.  She has chronic atrial fibrillation.  Ventricular rate seems to be more controlled than yesterday.  She is currently on diltiazem drip and heparin drip.  Inpatient Medications    Scheduled Meds: . Chlorhexidine Gluconate Cloth  6 each Topical Daily  . HYDROmorphone      . metoprolol tartrate  5 mg Intravenous Q8H   Continuous Infusions: . sodium chloride 100 mL/hr at 09/12/19 0239  . diltiazem (CARDIZEM) infusion 10 mg/hr (09/12/19 0634)  . heparin 1,000 Units/hr (09/12/19 [id number])  . piperacillin-tazobactam (ZOSYN)  IV 3.375 g (09/12/19 0913)   PRN Meds: fentaNYL (SUBLIMAZE) injection, HYDROmorphone (DILAUDID) injection, ipratropium-albuterol   Vital Signs    Vitals:   09/12/19 0700 09/12/19 0750 09/12/19 0817 09/12/19 0920  BP: 104/80  109/74 109/64  Pulse: 95  82 92  Resp: 13  15 13   Temp:  98.1 F (36.7 C)    TempSrc:  Oral    SpO2:  95% 97% 99%  Weight:      Height:        Intake/Output Summary (Last 24 hours) at 09/12/2019 0929 Last data filed at 09/12/2019 [id number] Gross per 24 hour  Intake 1751.06 ml  Output 1205 ml  Net 546.06 ml   Last 3 Weights 09/12/2019 09/11/2019 09/11/2019  Weight (lbs) 283 lb 1.1 oz 285 lb 12.8 oz 280 lb  Weight (kg) 128.4 kg 129.638 kg 127.007 kg      Telemetry    Atrial fibrillation with heart rate in the 90s to 110 bpm- Personally Reviewed  ECG    Not performed today- Personally Reviewed  Physical Exam  She is on BiPAP no acute distress. GEN: No acute distress.   Neck: No JVD Cardiac:  Irregularly irregular mildly tachycardic, no murmurs, rubs, or gallops.  Respiratory: Clear to auscultation bilaterally.  GI: Soft, nontender, non-distended  MS: No edema; No deformity. Neuro:  Nonfocal  Psych: Normal affect   Labs    High Sensitivity Troponin:   Recent Labs  Lab 09/11/19 1041  TROPONINIHS 9      Chemistry Recent Labs  Lab 09/11/19 1041 09/12/19 0449  NA 135 136  K 3.9 4.3  CL 101 107  CO2 26 21*  GLUCOSE 108* 207*  BUN 16 18  CREATININE 0.93 1.06*  CALCIUM 8.3* 8.2*  PROT 6.0* 6.1*  ALBUMIN 3.3* 3.5  AST 24 21  ALT 18 17  ALKPHOS 56 55  BILITOT 1.0 1.0  GFRNONAA >60 54*  GFRAA >60 >60  ANIONGAP 8 8     Hematology Recent Labs  Lab 09/11/19 1041 09/12/19 0449  WBC 11.8* 14.3*  RBC 4.22 4.26  HGB 12.8 12.8  HCT 39.3 40.1  MCV 93.1 94.1  MCH 30.3 30.0  MCHC 32.6 31.9  RDW 13.8 14.1  PLT 296 343    BNPNo results for input(s): BNP, PROBNP in the last 168 hours.   DDimer No results for input(s): DDIMER in the last 168 hours.   Radiology    Ct Head Wo Contrast  Result Date: 09/11/2019 CLINICAL DATA:  Head trauma, patient on anticoagulation EXAM: CT HEAD WITHOUT CONTRAST TECHNIQUE: Contiguous axial images were obtained from the base of the skull through the vertex without  intravenous contrast. COMPARISON:  09/13/2013 FINDINGS: Brain: No evidence of acute infarction, hemorrhage, extra-axial collection, ventriculomegaly, or mass effect. Vascular: Cerebrovascular atherosclerotic calcifications are noted. Skull: Negative for fracture or focal lesion. Sinuses/Orbits: Visualized portions of the orbits are unremarkable. Visualized portions of the paranasal sinuses and mastoid air cells are unremarkable. Other: None. IMPRESSION: No acute intracranial pathology. Electronically Signed   By: Kathreen Devoid   On: 09/11/2019 12:51   Ct Abdomen Pelvis W Contrast  Result Date: 09/11/2019 CLINICAL DATA:  Blunt abdominal trauma. Status post fall yesterday. Now with nausea, vomiting and body aches. EXAM: CT ABDOMEN AND PELVIS WITH CONTRAST TECHNIQUE: Multidetector CT imaging of  the abdomen and pelvis was performed using the standard protocol following bolus administration of intravenous contrast. CONTRAST:  117mL OMNIPAQUE IOHEXOL 300 MG/ML  SOLN COMPARISON:  None. FINDINGS: Lower chest: No acute abnormality. Hepatobiliary: Small hypodense foci within the RIGHT liver lobe, too small to definitively characterize but most suggestive of benign cysts. No acute or suspicious findings within the liver. Gallbladder appears normal. No bile duct dilatation. Pancreas: Unremarkable. No pancreatic ductal dilatation or surrounding inflammatory changes. Spleen: Normal in size without focal abnormality. Adrenals/Urinary Tract: Adrenal glands appear normal. Kidneys appear normal without mass, stone or hydronephrosis. No ureteral or bladder calculi identified. Bladder is decompressed. Stomach/Bowel: Extensive diverticulosis of the LEFT colon but no focal inflammatory change to suggest acute diverticulitis. Edematous thickening of the distal small bowel (ileum) with associated fluid stranding/inflammation in the surrounding mesentery. No pneumatosis intestinalis appreciated. More proximal small bowel is normal in caliber. No other site of bowel wall thickening/inflammation. Stomach is unremarkable, partially decompressed. Appendix is normal. Vascular/Lymphatic: Aortic atherosclerosis. No acute appearing vascular abnormality. No enlarged lymph nodes seen. Reproductive: Uterus and bilateral adnexa are unremarkable. Other: Small amount of free fluid in the lower abdomen and pelvis. No circumscribed collection or abscess like collection seen. No free intraperitoneal air. Musculoskeletal: No acute or suspicious osseous finding. Degenerative spondylosis of the thoracolumbar spine, mild to moderate in degree. IMPRESSION: 1. Pronounced edematous thickening of the walls of the distal small bowel (ileum) with associated fluid stranding/inflammation in the surrounding mesentery. Findings are consistent with a focal  small bowel enteritis of infectious, inflammatory or ischemic nature. Given patient's diffuse aortic atherosclerosis, I FAVOR ISCHEMIC BOWEL. Next most likely differential would be a focal severe infectious enteritis. Given patient's age, Crohn's disease is considered less likely. 2. Small amount of free fluid in the lower abdomen and pelvis. No circumscribed collection or abscess like collection seen. No free intraperitoneal air. 3. Extensive diverticulosis of the LEFT colon without evidence of acute diverticulitis. 4. Aortic atherosclerosis. Aortic Atherosclerosis (ICD10-I70.0). Electronically Signed   By: Franki Cabot M.D.   On: 09/11/2019 12:54   Dg Chest Port 1 View  Result Date: 09/11/2019 CLINICAL DATA:  Fall, abdominal pain, tachycardia EXAM: PORTABLE CHEST 1 VIEW COMPARISON:  CTA chest dated 06/29/2016 FINDINGS: Lungs are clear.  No pleural effusion or pneumothorax. The heart is top-normal in size.  Thoracic aortic atherosclerosis. IMPRESSION: No evidence of acute cardiopulmonary disease. Thoracic aortic atherosclerosis. Electronically Signed   By: Julian Hy M.D.   On: 09/11/2019 11:45   Ct Angio Abd/pel W And/or Wo Contrast  Result Date: 09/11/2019 CLINICAL DATA:  Possible ischemic bowel EXAM: CTA ABDOMEN AND PELVIS WITH CONTRAST TECHNIQUE: Multidetector CT imaging of the abdomen and pelvis was performed using the standard protocol during bolus administration of intravenous contrast. Multiplanar reconstructed images and MIPs were obtained and reviewed to evaluate the vascular anatomy.  CONTRAST:  121mL OMNIPAQUE IOHEXOL 350 MG/ML SOLN COMPARISON:  Scan from earlier the same day FINDINGS: VASCULAR Aorta: Moderate calcified atheromatous plaque throughout. No aneurysm, dissection, or stenosis. Celiac: Calcified ostial plaque resulting in short segment stenosis of at least mild severity. Patent distally. SMA: Calcified ostial plaque resulting in short segment stenosis of at least mild severity.  Widely patent distally. Classic distal branch anatomy. Renals: Single left, widely patent. Single right, with ostial plaque resulting in only mild stenosis, widely patent distally. IMA: Patent, diminutive. Inflow: Calcified nonocclusive plaque through bilateral common iliac arteries. Internal and external iliac arteries unremarkable. Proximal Outflow: Bilateral common femoral and visualized portions of the superficial and profunda femoral arteries are patent without evidence of aneurysm, dissection, vasculitis or significant stenosis. Veins: Patent portal vein, SMV, splenic vein, bilateral renal veins. No venous pathology is evident. Delayed venous phase was demonstrated on the previous study. Review of the MIP images confirms the above findings. NON-VASCULAR Lower chest: Linear scarring or subsegmental atelectasis in the visualized lung bases. No pleural or pericardial effusion. Hepatobiliary: Stable low-attenuation right lobe lesions, statistically most likely cysts in the absence of a history of primary carcinoma, but incompletely characterized. Gallbladder physiologically distended. No biliary ductal dilatation. Pancreas: Unremarkable. No pancreatic ductal dilatation or surrounding inflammatory changes. Spleen: Normal in size without focal abnormality. Adrenals/Urinary Tract: Normal adrenals. Stomach/Bowel: Stomach is decompressed. Small bowel is nondilated. Persistent segment of circumferential wall thickening in the distal ileum over length of at least 30 cm extending nearly to the terminal ileum. Inflammatory/edematous changes in the adjacent mesentery with small regional pockets of interloop ascites without evidence of loculation or peripheral enhancement. Ileocecal valve unremarkable. Appendix not well seen. The colon is nondilated with scattered diverticula from distal descending and sigmoid segment. Lymphatic: No abdominal or pelvic adenopathy. Reproductive: Uterus and bilateral adnexa are unremarkable.  Other: Small volume right lower quadrant pelvic ascites as above. No free air. Musculoskeletal: Multilevel spondylitic changes in the lumbar spine. No fracture or worrisome bone lesion. IMPRESSION: 1. 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 If there is continued clinical concern of ischemic bowel, arteriogram with pressure measurements across the stenoses may be useful to better define their hemodynamic significance, as they may be approachable for percutaneous intervention if indicated. 2. Persistent circumferential wall thickening in the distal ileum with regional inflammatory/edematous changes and mesenteric fluid; no abscess. Inflammatory bowel disease, infectious/inflammatory enteritis, or ischemia could give this appearance. 3. Descending and sigmoid diverticulosis. Electronically Signed   By: Lucrezia Europe M.D.   On: 09/11/2019 15:58    Cardiac Studies   Outpatient echocardiogram in August showed normal LV systolic function.  Patient Profile     68 y.o. female with history of chronic atrial fibrillation, essential hypertension, COPD, obstructive sleep apnea and peripheral arterial disease who presented with severe abdominal pain and vomiting after a fall and was found to have ischemic bowel is likely from hypoperfusion.  Assessment & Plan    Chronic atrial fibrillation: Now with RVR due to above medical problems.  Continue rate control with diltiazem drip and IV metoprolol to be used as needed.  Once she is able to tolerate oral intake, she can be switched to oral medications.  I agree with heparin drip for now and once there are no surgical issues, Eliquis can be resumed.  Acute ischemic colitis: Status post resection.  Surgery is following.  Essential hypertension: Continue to hold losartan in order to uptitrate diltiazem as needed  for A. Fib.       For questions or updates, please contact [facility] Please  consult [email or web address] for contact info under        Signed, Kathlyn Sacramento, MD  09/12/2019, 9:29 AM

## 2019-09-12 NOTE — Anesthesia Post-op Follow-up Note (Signed)
Anesthesia QCDR form completed.        

## 2019-09-12 NOTE — H&P (View-Only) (Signed)
[facility] Day(s): 1.   Post op day(s): 1 Day Post-Op.   Interval History: Patient seen and examined, no acute events or new complaints overnight. Patient reports feeling better this morning.  She does report that she does not recall that she had surgery last night.  She is resting comfortably with the CPAP machine.  Vital signs has been stable.  Pain better controlled this morning than last night.  Denies significant pain at this moment.  There is no pain radiation.  Vital signs in last 24 hours: [min-max] current  Temp:  [97.1 F (36.2 C)-98.9 F (37.2 C)] 98.2 F (36.8 C) (09/21 1133) Pulse Rate:  [81-141] 81 (09/21 1133) Resp:  [10-26] 15 (09/21 1133) BP: (104-150)/(55-95) 109/64 (09/21 0920) SpO2:  [92 %-100 %] 99 % (09/21 1133) Weight:  [128.4 kg-129.6 kg] 128.4 kg (09/21 0153)     Height: 5\' 4"  (162.6 cm) Weight: 128.4 kg BMI (Calculated): 48.57   Physical Exam:  Constitutional: alert, cooperative and no distress  Respiratory: breathing non-labored at rest  Cardiovascular: irregular rate and rhythm.  Gastrointestinal: soft, mild-tender, and non-distended.  Negative pressure dressing in place  Labs:  CBC Latest Ref Rng & Units 09/12/2019 09/11/2019 01/28/2019  WBC 4.0 - 10.5 K/uL 14.3(H) 11.8(H) 12.7(H)  Hemoglobin 12.0 - 15.0 g/dL 12.8 12.8 13.2  Hematocrit 36.0 - 46.0 % 40.1 39.3 38.6  Platelets 150 - 400 K/uL 343 296 350   CMP Latest Ref Rng & Units 09/12/2019 09/11/2019 01/28/2019  Glucose 70 - 99 mg/dL 207(H) 108(H) 75  BUN 8 - 23 mg/dL 18 16 30(H)  Creatinine 0.44 - 1.00 mg/dL 1.06(H) 0.93 1.15(H)  Sodium 135 - 145 mmol/L 136 135 136  Potassium 3.5 - 5.1 mmol/L 4.3 3.9 5.0  Chloride 98 - 111 mmol/L 107 101 101  CO2 22 - 32 mmol/L 21(L) 26 21  Calcium 8.9 - 10.3 mg/dL 8.2(L) 8.3(L) 9.5  Total Protein 6.5 - 8.1 g/dL 6.1(L) 6.0(L) -  Total Bilirubin 0.3 - 1.2 mg/dL 1.0 1.0 -  Alkaline Phos 38 - 126 U/L 55 56 -  AST 15 - 41 U/L 21 24 -  ALT 0 -  44 U/L 17 18 -    Imaging studies: No new pertinent imaging studies   Assessment/Plan:  68 y.o. female with distal ileum ischemia s/p small bowel and right colectomy postoperative day #1, complicated by pertinent comorbidities including atria fibrillation on anticoagulation, Pulmonary hypertension, COPD, sleep apnea, morbid obesity, hypertension. Patient has been tolerating the procedure well.  Recovering slowly.  This morning with better pain control.  She is still with significant need of BiPAP for assistant of respiration due to COPD and pulmonary hypertension.  Patient hemoglobin stable.  During her sign of bleeding.  But is and has been stable without fever and no hypotension.  Patient continue with regular rate and rhythm.  I appreciate ICU physician and cardiology assistance.  The patient does not have anastomosis at the moment.  She needs NGT to low intermittent suction.  Expect to perform a second look tomorrow and possible anastomosis of the intestine.  Today she will be taken by vascular surgery for angiography and possible intervention if revascularization needed.  This will be of great help to improve the circulation for the anastomosis tomorrow or at least to ensure that the circulation is adequate before performing the anastomosis.  I called husband and left a voicemail updating about patient's status.  We will continue with IV fluids for hydration  and IV antibiotic therapy.  Patient currently on therapeutic anticoagulation due to the A. fib.  Patient also with sequential compression device.  Arnold Long, MD

## 2019-09-12 NOTE — Anesthesia Postprocedure Evaluation (Signed)
Anesthesia Post Note  Patient: Laura [name]  Procedure(s) Performed: LAPAROSCOPY DIAGNOSTIC (N/A Abdomen) SMALL BOWEL RESECTION RIGHT COLECTOMY  Patient location during evaluation: ICU Anesthesia Type: General Level of consciousness: awake and alert Pain management: pain level controlled Vital Signs Assessment: post-procedure vital signs reviewed and stable Respiratory status: spontaneous breathing, nonlabored ventilation, respiratory function stable and patient connected to nasal cannula oxygen Cardiovascular status: blood pressure returned to baseline and stable Postop Assessment: no apparent nausea or vomiting Anesthetic complications: no     Last Vitals:  Vitals:   09/12/19 0500 09/12/19 0600  BP:  112/67  Pulse: 99 89  Resp: 20 15  Temp:    SpO2: 99% 99%    Last Pain:  Vitals:   09/12/19 0646  TempSrc:   PainSc: 8                  Akira Adelsberger,  Clearnce Sorrel

## 2019-09-12 NOTE — Op Note (Signed)
[facility] Percutaneous Study/Intervention Procedural Note   Date: 09/12/2019  Surgeon(s): Leotis Pain, MD  Assistants: none  Pre-operative Diagnosis: 1.  Distal small bowel ischemia requiring resection    Post-operative diagnosis: Same  Procedure(s) Performed: 1. Ultrasound guidance for vascular access right femoral artery  2. Catheter placement into celiac artery, SMA, and IMA from right femoral approach 3. Aortogram and selective angiogram of the celiac artery, SMA, and IMA 4. StarClose closure device right femoral artery  Contrast: 70 cc  Fluoro time: 4.5 minutes  EBL: 10 cc  Anesthesia: Approximately 25 minutes of Moderate conscious sedation using 2 mg of Versed and 50 mcg of Fentanyl  Indications: [name] who was admitted with ischemic bowel and had to undergo a small bowel and right colon resection for ischemia early this morning.  A preoperative CT scan showed a large amount of calcium near the origins of the celiac and SMA with a small IMA.  The degree of stenosis was impossible to tell from the CT scan.  The patient is brought in for angiography for further evaluation and potential treatment. Risks and benefits are discussed and informed consent is obtained  Procedure: The patient was identified and appropriate procedural time out was performed. The patient was then placed supine on the table and prepped and draped in the usual sterile fashion. Moderate conscious sedation was administered during a face to face encounter with the patient throughout the procedure with my supervision of the RN administering medicines and monitoring the patient's vital signs, pulse oximetry, telemetry and mental status throughout from the start of the procedure until the patient was taken to the recovery room. Ultrasound was used to evaluate the right common femoral artery. It was  patent . A digital ultrasound image was acquired. A Seldinger needle was used to access the right common femoral artery under direct ultrasound guidance and a permanent image was performed. A 0.035 J wire was advanced without resistance and a 5Fr sheath was placed. Pigtail catheter was placed into the aorta and an AP aortogram was performed. This demonstrated reasonably normal origins of the renal arteries bilaterally with minimal left renal artery stenosis.  The aorta itself was patent.  Flow was seen in the visceral vessels, but the origins could not be fully elucidated from the AP projection. We transitioned to the lateral projection to image the celiac and SMA were actually able to see the IMA as well. The lateral image demonstrated what appeared to be some mild calcific disease at the origin of the celiac and SMA but there was good flow within the vessels beyond this.  Selective imaging was then performed. The patient was given 3000 units of IV heparin.  A VS1 catheter was used to selectively cannulate the celiac artery first.  There was a calcific lesion but the degree of stenosis was only in the 25 to 30% range at the origin of the celiac artery.  The V S1 catheter was then removed from the celiac artery and pulled down to selectively cannulate superior mesenteric artery.  Selective imaging was performed which showed brisk flow through the SMA with reasonably normal vessel and only about a 10% stenosis present and good flow out to all the main primary branches. This did not require any intervention.  I was then able to pull the V S1 catheter down and with a mild amount of difficulty cannulate the inferior mesenteric artery.  The IMA was a much smaller vessel than the  SMA, but had pretty good flow and very mild disease was seen in the proximal IMA not creating any hemodynamically significant stenosis.  The vessel was only slightly larger than the catheter, so the degree of stenosis was difficult to  discern but was clearly less than 40%.  All of the main vessels were patent with good flow and there was no role for endovascular revascularization.  At this point, I elected to terminate the procedure. The diagnostic catheter was removed. StarClose closure device was deployed in usual fashion with excellent hemostatic result. The patient was taken to the recovery room in stable condition having tolerated the procedure well.     Findings:Mild disease in the celiac artery, SMA, and IMA not creating any hemodynamic significance and not requiring any vascular intervention.  Disposition: Patient was taken to the recovery room in stable condition having tolerated the procedure well.  Complications: None  Leotis Pain 09/12/2019 3:51 PM   This note was created with Dragon Medical transcription system. Any errors in dictation are purely unintentional.

## 2019-09-13 ENCOUNTER — Encounter: Payer: Self-pay | Admitting: Vascular Surgery

## 2019-09-13 ENCOUNTER — Inpatient Hospital Stay: Payer: BC Managed Care – PPO | Admitting: Anesthesiology

## 2019-09-13 ENCOUNTER — Encounter
Admission: EM | Disposition: A | Discharge status: Home Health | Payer: Self-pay | Source: Home / Self Care | Attending: Internal Medicine

## 2019-09-13 ENCOUNTER — Ambulatory Visit: Payer: BC Managed Care – PPO | Admitting: Nurse Practitioner

## 2019-09-13 DIAGNOSIS — K559 Vascular disorder of intestine, unspecified: Secondary | ICD-10-CM

## 2019-09-13 DIAGNOSIS — I482 Chronic atrial fibrillation, unspecified: Secondary | ICD-10-CM

## 2019-09-13 HISTORY — PX: LAPAROTOMY: SHX154

## 2019-09-13 LAB — BASIC METABOLIC PANEL
Anion gap: 8 (ref 5–15)
BUN: 26 mg/dL — ABNORMAL HIGH (ref 8–23)
CO2: 22 mmol/L (ref 22–32)
Calcium: 8.8 mg/dL — ABNORMAL LOW (ref 8.9–10.3)
Chloride: 107 mmol/L (ref 98–111)
Creatinine, Ser: 0.93 mg/dL (ref 0.44–1.00)
GFR calc Af Amer: >60 mL/min (ref >60)
GFR calc non Af Amer: >60 mL/min (ref >60)
Glucose, Bld: 118 mg/dL — ABNORMAL HIGH (ref 70–99)
Potassium: 4.6 mmol/L (ref 3.5–5.1)
Sodium: 137 mmol/L (ref 135–145)

## 2019-09-13 LAB — PROCALCITONIN: Procalcitonin: 2.14 ng/mL

## 2019-09-13 LAB — CBC
HCT: 36.9 % (ref 36.0–46.0)
Hemoglobin: 11.6 g/dL — ABNORMAL LOW (ref 12.0–15.0)
MCH: 29.4 pg (ref 26.0–34.0)
MCHC: 31.4 g/dL (ref 30.0–36.0)
MCV: 93.7 fL (ref 80.0–100.0)
Platelets: 273 10*3/uL (ref 150–400)
RBC: 3.94 MIL/uL (ref 3.87–5.11)
RDW: 14 % (ref 11.5–15.5)
WBC: 12.5 10*3/uL — ABNORMAL HIGH (ref 4.0–10.5)
nRBC: 0 % (ref 0.0–0.2)

## 2019-09-13 LAB — HEPARIN LEVEL (UNFRACTIONATED): Heparin Unfractionated: 0.96 IU/mL — ABNORMAL HIGH (ref 0.30–0.70)

## 2019-09-13 LAB — HIV ANTIBODY (ROUTINE TESTING W REFLEX): HIV Screen 4th Generation wRfx: NONREACTIVE

## 2019-09-13 LAB — APTT: aPTT: 39 seconds — ABNORMAL HIGH (ref 24–36)

## 2019-09-13 SURGERY — LAPAROTOMY, EXPLORATORY
Anesthesia: General

## 2019-09-13 MED ORDER — ETOMIDATE 2 MG/ML IV SOLN
INTRAVENOUS | Status: DC | PRN
  Administered 2019-09-13 (×2): 18 mg via INTRAVENOUS

## 2019-09-13 MED ORDER — MIDAZOLAM HCL 2 MG/2ML IJ SOLN
INTRAMUSCULAR | Status: AC
  Filled 2019-09-13: qty 2

## 2019-09-13 MED ORDER — LIDOCAINE HCL (CARDIAC) PF 100 MG/5ML IV SOSY
PREFILLED_SYRINGE | INTRAVENOUS | Status: DC | PRN
  Administered 2019-09-13: 50 mg via INTRAVENOUS

## 2019-09-13 MED ORDER — HEPARIN (PORCINE) 25000 UT/250ML-% IV SOLN
1800.0000 [IU]/h | INTRAVENOUS | Status: DC
  Administered 2019-09-13: 1500 [IU]/h via INTRAVENOUS
  Administered 2019-09-14 – 2019-09-15 (×2): 1800 [IU]/h via INTRAVENOUS
  Filled 2019-09-13 (×3): qty 250

## 2019-09-13 MED ORDER — ETOMIDATE 2 MG/ML IV SOLN
INTRAVENOUS | Status: AC
  Filled 2019-09-13: qty 10

## 2019-09-13 MED ORDER — ACETAMINOPHEN 10 MG/ML IV SOLN
INTRAVENOUS | Status: DC | PRN
  Administered 2019-09-13: 1000 mg via INTRAVENOUS

## 2019-09-13 MED ORDER — ONDANSETRON HCL 4 MG/2ML IJ SOLN
INTRAMUSCULAR | Status: AC
  Administered 2019-09-13: 14:00:00 4 mg via INTRAVENOUS
  Filled 2019-09-13: qty 2

## 2019-09-13 MED ORDER — DEXAMETHASONE SODIUM PHOSPHATE 10 MG/ML IJ SOLN
INTRAMUSCULAR | Status: AC
  Filled 2019-09-13: qty 1

## 2019-09-13 MED ORDER — SODIUM CHLORIDE (PF) 0.9 % IJ SOLN
INTRAMUSCULAR | Status: AC
  Filled 2019-09-13: qty 50

## 2019-09-13 MED ORDER — HEPARIN (PORCINE) 25000 UT/250ML-% IV SOLN: 1500.0000 [IU]/h | INTRAVENOUS | Status: DC

## 2019-09-13 MED ORDER — SUGAMMADEX SODIUM 200 MG/2ML IV SOLN
INTRAVENOUS | Status: AC
  Filled 2019-09-13: qty 4

## 2019-09-13 MED ORDER — BUPIVACAINE LIPOSOME 1.3 % IJ SUSP
INTRAMUSCULAR | Status: AC
  Filled 2019-09-13: qty 20

## 2019-09-13 MED ORDER — ACETAMINOPHEN NICU IV SYRINGE 10 MG/ML
INTRAVENOUS | Status: AC
  Filled 2019-09-13: qty 1

## 2019-09-13 MED ORDER — SODIUM CHLORIDE 0.9 % IV SOLN
INTRAVENOUS | Status: DC | PRN
  Administered 2019-09-13: 70 mL

## 2019-09-13 MED ORDER — BUPIVACAINE-EPINEPHRINE 0.5% -1:200000 IJ SOLN
INTRAMUSCULAR | Status: DC | PRN
  Administered 2019-09-13: 30 mL

## 2019-09-13 MED ORDER — SUCCINYLCHOLINE CHLORIDE 20 MG/ML IJ SOLN
INTRAMUSCULAR | Status: AC
  Filled 2019-09-13: qty 1

## 2019-09-13 MED ORDER — FENTANYL CITRATE (PF) 100 MCG/2ML IJ SOLN: 25.0000 ug | INTRAMUSCULAR | Status: DC | PRN

## 2019-09-13 MED ORDER — DEXMEDETOMIDINE HCL 200 MCG/2ML IV SOLN
INTRAVENOUS | Status: DC | PRN
  Administered 2019-09-13: 4 ug via INTRAVENOUS

## 2019-09-13 MED ORDER — SUGAMMADEX SODIUM 200 MG/2ML IV SOLN
INTRAVENOUS | Status: DC | PRN
  Administered 2019-09-13: 300 mg via INTRAVENOUS

## 2019-09-13 MED ORDER — ONDANSETRON HCL 4 MG/2ML IJ SOLN
INTRAMUSCULAR | Status: AC
  Filled 2019-09-13: qty 2

## 2019-09-13 MED ORDER — PROPOFOL 10 MG/ML IV BOLUS
INTRAVENOUS | Status: AC
  Filled 2019-09-13: qty 20

## 2019-09-13 MED ORDER — ROCURONIUM BROMIDE 50 MG/5ML IV SOLN
INTRAVENOUS | Status: AC
  Filled 2019-09-13: qty 1

## 2019-09-13 MED ORDER — BUPIVACAINE-EPINEPHRINE (PF) 0.5% -1:200000 IJ SOLN
INTRAMUSCULAR | Status: AC
  Filled 2019-09-13: qty 30

## 2019-09-13 MED ORDER — PNEUMOCOCCAL VAC POLYVALENT 25 MCG/0.5ML IJ INJ
0.5000 mL | INJECTION | INTRAMUSCULAR | Status: AC
  Administered 2019-09-14: 0.5 mL via INTRAMUSCULAR
  Filled 2019-09-13: qty 0.5

## 2019-09-13 MED ORDER — FENTANYL CITRATE (PF) 100 MCG/2ML IJ SOLN
INTRAMUSCULAR | Status: AC
  Filled 2019-09-13: qty 2

## 2019-09-13 MED ORDER — INFLUENZA VAC A&B SA ADJ QUAD 0.5 ML IM PRSY
0.5000 mL | PREFILLED_SYRINGE | INTRAMUSCULAR | Status: AC
  Administered 2019-09-14: 0.5 mL via INTRAMUSCULAR
  Filled 2019-09-13: qty 0.5

## 2019-09-13 MED ORDER — FENTANYL CITRATE (PF) 100 MCG/2ML IJ SOLN
INTRAMUSCULAR | Status: DC | PRN
  Administered 2019-09-13: 50 ug via INTRAVENOUS
  Administered 2019-09-13: 25 ug via INTRAVENOUS
  Administered 2019-09-13: 50 ug via INTRAVENOUS
  Administered 2019-09-13: 25 ug via INTRAVENOUS
  Administered 2019-09-13: 50 ug via INTRAVENOUS

## 2019-09-13 MED ORDER — ONDANSETRON HCL 4 MG/2ML IJ SOLN
INTRAMUSCULAR | Status: DC | PRN
  Administered 2019-09-13: 4 mg via INTRAVENOUS

## 2019-09-13 MED ORDER — LACTATED RINGERS IV SOLN
INTRAVENOUS | Status: DC | PRN
  Administered 2019-09-13: 12:00:00 via INTRAVENOUS

## 2019-09-13 MED ORDER — PHENYLEPHRINE HCL (PRESSORS) 10 MG/ML IV SOLN
INTRAVENOUS | Status: DC | PRN
  Administered 2019-09-13 (×4): 100 ug via INTRAVENOUS

## 2019-09-13 MED ORDER — SUCCINYLCHOLINE CHLORIDE 20 MG/ML IJ SOLN
INTRAMUSCULAR | Status: DC | PRN
  Administered 2019-09-13: 180 mg via INTRAVENOUS

## 2019-09-13 MED ORDER — ROCURONIUM BROMIDE 100 MG/10ML IV SOLN
INTRAVENOUS | Status: DC | PRN
  Administered 2019-09-13: 5 mg via INTRAVENOUS
  Administered 2019-09-13: 45 mg via INTRAVENOUS

## 2019-09-13 MED ORDER — SEVOFLURANE IN SOLN
RESPIRATORY_TRACT | Status: AC
  Filled 2019-09-13: qty 250

## 2019-09-13 MED ORDER — DEXAMETHASONE SODIUM PHOSPHATE 10 MG/ML IJ SOLN
INTRAMUSCULAR | Status: DC | PRN
  Administered 2019-09-13: 10 mg via INTRAVENOUS

## 2019-09-13 MED ORDER — FENTANYL CITRATE (PF) 250 MCG/5ML IJ SOLN
INTRAMUSCULAR | Status: AC
  Filled 2019-09-13: qty 5

## 2019-09-13 MED ORDER — LIDOCAINE HCL (PF) 2 % IJ SOLN
INTRAMUSCULAR | Status: AC
  Filled 2019-09-13: qty 10

## 2019-09-13 MED ORDER — ONDANSETRON HCL 4 MG/2ML IJ SOLN
4.0000 mg | Freq: Once | INTRAMUSCULAR | Status: AC | PRN
  Administered 2019-09-13: 14:00:00 4 mg via INTRAVENOUS

## 2019-09-13 SURGICAL SUPPLY — 43 items
CANISTER SUCT 1200ML W/VALVE (MISCELLANEOUS) ×2 IMPLANT
CHLORAPREP W/TINT 26 (MISCELLANEOUS) ×2 IMPLANT
CNTNR SPEC 2.5X3XGRAD LEK (MISCELLANEOUS) ×1
CONT SPEC 4OZ STER OR WHT (MISCELLANEOUS) ×1
CONTAINER SPEC 2.5X3XGRAD LEK (MISCELLANEOUS) IMPLANT
COVER WAND RF STERILE (DRAPES) ×2 IMPLANT
DRAPE LAPAROTOMY 100X77 ABD (DRAPES) ×2 IMPLANT
DRSG OPSITE POSTOP 4X10 (GAUZE/BANDAGES/DRESSINGS) ×1 IMPLANT
DRSG OPSITE POSTOP 4X8 (GAUZE/BANDAGES/DRESSINGS) ×1 IMPLANT
DRSG TEGADERM 4X10 (GAUZE/BANDAGES/DRESSINGS) ×1 IMPLANT
DRSG TELFA 3X8 NADH (GAUZE/BANDAGES/DRESSINGS) IMPLANT
ELECT REM PT RETURN 9FT ADLT (ELECTROSURGICAL) ×2
ELECTRODE REM PT RTRN 9FT ADLT (ELECTROSURGICAL) ×1 IMPLANT
GAUZE 4X4 16PLY RFD (DISPOSABLE) ×1 IMPLANT
GLOVE BIO SURGEON STRL SZ 6.5 (GLOVE) ×6 IMPLANT
GLOVE BIOGEL PI IND STRL 6.5 (GLOVE) ×1 IMPLANT
GLOVE BIOGEL PI INDICATOR 6.5 (GLOVE) ×5
GOWN STRL REUS W/ TWL LRG LVL3 (GOWN DISPOSABLE) ×2 IMPLANT
GOWN STRL REUS W/TWL LRG LVL3 (GOWN DISPOSABLE) ×6
KIT PREVENA INCISION MGT20CM45 (CANNISTER) ×1 IMPLANT
KIT TURNOVER KIT A (KITS) ×2 IMPLANT
LABEL OR SOLS (LABEL) ×2 IMPLANT
NEEDLE HYPO 22GX1.5 SAFETY (NEEDLE) ×1 IMPLANT
NS IRRIG 1000ML POUR BTL (IV SOLUTION) ×2 IMPLANT
PACK BASIN MAJOR ARMC (MISCELLANEOUS) ×2 IMPLANT
PACK COLON CLEAN CLOSURE (MISCELLANEOUS) ×2 IMPLANT
PAD DRESSING TELFA 3X8 NADH (GAUZE/BANDAGES/DRESSINGS) ×1 IMPLANT
SPONGE LAP 18X18 RF (DISPOSABLE) ×2 IMPLANT
STAPLER GUN LINEAR PROX 60 (STAPLE) ×1 IMPLANT
STAPLER PROXIMATE 75MM BLUE (STAPLE) ×1 IMPLANT
STAPLER SKIN PROX 35W (STAPLE) ×1 IMPLANT
SUT PDS AB 0 CT1 27 (SUTURE) ×2 IMPLANT
SUT PDS AB 1 TP1 54 (SUTURE) ×2 IMPLANT
SUT SILK 2 0 (SUTURE)
SUT SILK 2-0 18XBRD TIE 12 (SUTURE) ×1 IMPLANT
SUT SILK 3 0 (SUTURE)
SUT SILK 3-0 18XBRD TIE 12 (SUTURE) ×1 IMPLANT
SUT VIC AB 3-0 SH 27 (SUTURE)
SUT VIC AB 3-0 SH 27X BRD (SUTURE) ×2 IMPLANT
SUT VICRYL 3-0 CR8 SH (SUTURE) ×1 IMPLANT
SUT VICRYL+ 3-0 36IN CT-1 (SUTURE) ×1 IMPLANT
SYR 30ML LL (SYRINGE) ×2 IMPLANT
TRAY FOLEY MTR SLVR 16FR STAT (SET/KITS/TRAYS/PACK) ×1 IMPLANT

## 2019-09-13 NOTE — Progress Notes (Signed)
Pharmacy Antibiotic Note  Laura [name] is a 68 y.o. female admitted on 09/11/2019 with colitis. History significant for chronic atrial fibrillation, hypertension, pulmonary hypertension, COPD, peripheral artery disease. CT scan of abdomen and pelvis revealed focal small bowel enteritis. Leukocytosis and elevated lactate and PCT of 4.01 on admit. Pharmacy has been consulted for Zosyn dosing.  Patient is POD #1 for small bowel and right colectomy which was left in discontinuity. Today she was taken back to OR for intestinal anastomosis. Today WBC and PCT are down-trending. Patient is afebrile.  Plan: Today is Day 2 of antibiotic therapy.   Continue Zosyn 3.375g IV q8h (4 hour infusion).   Follow-up antibiotic plan.   Height: 5\' 4"  (162.6 cm) Weight: 283 lb 1.1 oz (128.4 kg) IBW/kg (Calculated) : 54.7  Temp (24hrs), Avg:98.7 F (37.1 C), Min:98.2 F (36.8 C), Max:99 F (37.2 C)  Recent Labs  Lab 09/11/19 1041 09/11/19 1318 09/11/19 1711 09/12/19 0153 09/12/19 0449 09/13/19 0612  WBC 11.8*  --   --   --  14.3* 12.5*  CREATININE 0.93  --   --   --  1.06* 0.93  LATICACIDVEN  --  1.1 1.6 2.5*  --   --     Estimated Creatinine Clearance: 75.9 mL/min (by C-G formula based on SCr of 0.93 mg/dL).    Allergies  Allergen Reactions  . Flecainide Shortness Of Breath  . Propafenone Shortness Of Breath and Swelling  . Rivaroxaban Other (See Comments)    Antimicrobials this admission: Zosyn 09/20 >>   Dose adjustments this admission: n/a  Microbiology results: 09/20 Ova + Parasite Exam: pending 09/21 MRSA PCR: negative  Thank you for allowing pharmacy to be a part of this patient's care.  [place] Resident 09/13/2019 3:27 PM

## 2019-09-13 NOTE — Anesthesia Post-op Follow-up Note (Signed)
Anesthesia QCDR form completed.        

## 2019-09-13 NOTE — Progress Notes (Signed)
CRITICAL CARE PROGRESS NOTE    Name: Laura [name] MRN: [id number] DOB: [birth year]     LOS: 2   SUBJECTIVE FINDINGS & SIGNIFICANT EVENTS   Patient description:  68 year old female admitted with nausea, vomiting, atrial fibrillation and abdominal pain; CT abdomen suggested possible ischemia.  She was taken for an emergent laparoscopy with small and large bowel resection and wound VAC placement.  She was transferred to the ICU for close monitoring of her respiratory status   Protocols / Consultants: Surgery  Cardiology   Tests / Events: For surgery today  Overnight: Foley catheter    PAST MEDICAL HISTORY   Past Medical History:  Diagnosis Date   Asthma    Chronic atrial fibrillation    a. diagnosed in 09/2016; b. failed flecainide and propafenone due to LE swelling and SOB, could not afford Multaq; c. CHADS2VASc => 3 (HTN, age x 1, female); d. on Eliquis   GERD (gastroesophageal reflux disease)    History of stress test    a. Lexiscan Myoview 10/2016: no evidence of ischemia, EF 53%   Hypertension    Obesity    Obstructive sleep apnea    Pulmonary hypertension (HCC)    Systolic dysfunction    a. TTE 10/2016: EF 50%, mild LVH, moderately dilated LA, moderate MR/TR, mild pulmonary hypertension     SURGICAL HISTORY   Past Surgical History:  Procedure Laterality Date   BOWEL RESECTION  09/11/2019   Procedure: SMALL BOWEL RESECTION;  Surgeon: Herbert Pun, MD;  Location: [facility];  Service: General;;   cataract surgery     INCISION AND DRAINAGE ABSCESS Right 06/29/2016   Procedure: INCISION AND DRAINAGE ABSCESS;  Surgeon: Florene Glen, MD;  Location: [facility];  Service: General;  Laterality: Right;   INCISION AND DRAINAGE OF WOUND Left 06/29/2016   Procedure: IRRIGATION AND  DEBRIDEMENT WOUND;  Surgeon: Florene Glen, MD;  Location: [facility];  Service: General;  Laterality: Left;   LAPAROSCOPIC RIGHT COLECTOMY  09/11/2019   Procedure: RIGHT COLECTOMY;  Surgeon: Herbert Pun, MD;  Location: [facility];  Service: General;;   LAPAROSCOPY N/A 09/11/2019   Procedure: LAPAROSCOPY DIAGNOSTIC;  Surgeon: Herbert Pun, MD;  Location: [facility];  Service: General;  Laterality: N/A;   VISCERAL ANGIOGRAPHY N/A 09/12/2019   Procedure: VISCERAL ANGIOGRAPHY;  Surgeon: Algernon Huxley, MD;  Location: [place] [facility];  Service: Cardiovascular;  Laterality: N/A;     FAMILY HISTORY   Family History  Problem Relation Age of Onset   Dementia Mother    Osteoporosis Mother    Vascular Disease Mother    COPD Father      SOCIAL HISTORY   Social History   Tobacco Use   Smoking status: Never Smoker   Smokeless tobacco: Never Used  Substance Use Topics   Alcohol use: No   Drug use: No     MEDICATIONS   Current Medication:  Current Facility-Administered Medications:    0.9 %  sodium chloride infusion, , Intravenous, Continuous, Dew, Erskine Squibb, MD, Last Rate: 100 mL/hr at 09/13/19 1002   Chlorhexidine Gluconate Cloth 2 % PADS 6 each, 6 each, Topical, Daily, Dew, Erskine Squibb, MD, 6 each at 09/13/19 1003   diltiazem (CARDIZEM) 100 mg in dextrose 5 % 100 mL (1 mg/mL) infusion, 5-15 mg/hr, Intravenous, Continuous, Dew, Erskine Squibb, MD, Last Rate: 5 mL/hr at 09/13/19 0858, 5 mg/hr at 09/13/19 0858   fentaNYL (SUBLIMAZE) injection 50 mcg, 50 mcg, Intravenous, Q2H PRN, Algernon Huxley,  MD, 50 mcg at 09/13/19 0801   HYDROmorphone (DILAUDID) injection 1 mg, 1 mg, Intravenous, Q4H PRN, Algernon Huxley, MD, 1 mg at 09/13/19 [id number]   HYDROmorphone (DILAUDID) injection 1 mg, 1 mg, Intravenous, Once PRN, Lucky Cowboy, Erskine Squibb, MD   ipratropium-albuterol (DUONEB) 0.5-2.5 (3) MG/3ML nebulizer solution 3 mL, 3 mL, Nebulization, Q4H PRN, Algernon Huxley, MD, 3 mL at 09/11/19 2003    metoprolol tartrate (LOPRESSOR) injection 5 mg, 5 mg, Intravenous, Q8H, Dew, Erskine Squibb, MD, 5 mg at 09/13/19 0603   ondansetron (ZOFRAN) injection 4 mg, 4 mg, Intravenous, Q6H PRN, Lucky Cowboy, Erskine Squibb, MD   pantoprazole (PROTONIX) injection 40 mg, 40 mg, Intravenous, Q12H, Dew, Erskine Squibb, MD, 40 mg at 09/13/19 1003   piperacillin-tazobactam (ZOSYN) IVPB 3.375 g, 3.375 g, Intravenous, Q8H, Dew, Erskine Squibb, MD, Last Rate: 12.5 mL/hr at 09/13/19 1003, 3.375 g at 09/13/19 1003    ALLERGIES   Flecainide, Propafenone, and Rivaroxaban    REVIEW OF SYSTEMS     10  Point ROS neg except for abd pain  PHYSICAL EXAMINATION   Vital Signs: Temp:  [98.2 F (36.8 C)-99 F (37.2 C)] 99 F (37.2 C) (09/22 0800) Pulse Rate:  [37-112] 90 (09/22 0900) Resp:  [10-24] 15 (09/22 0900) BP: (99-136)/(51-98) 122/51 (09/22 0900) SpO2:  [88 %-100 %] 100 % (09/22 0900) Weight:  [128.4 kg] 128.4 kg (09/21 1420)  GENERAL:mild pain  HEAD: Normocephalic, atraumatic.  EYES: Pupils equal, round, reactive to light.  No scleral icterus.  MOUTH: Moist mucosal membrane. NECK: Supple. No thyromegaly. No nodules. No JVD.  PULMONARY: ctab CARDIOVASCULAR: S1 and S2. Regular rate and rhythm. No murmurs, rubs, or gallops.  GASTROINTESTINAL: Soft, nontender, non-distended. No masses. Positive bowel sounds. No hepatosplenomegaly.  MUSCULOSKELETAL: No swelling, clubbing, or edema.  NEUROLOGIC: Mild distress due to acute illness SKIN:intact,warm,dry   PERTINENT DATA     Infusions:  sodium chloride 100 mL/hr at 09/13/19 1002   diltiazem (CARDIZEM) infusion 5 mg/hr (09/13/19 0858)   piperacillin-tazobactam (ZOSYN)  IV 3.375 g (09/13/19 1003)   Scheduled Medications:  Chlorhexidine Gluconate Cloth  6 each Topical Daily   metoprolol tartrate  5 mg Intravenous Q8H   pantoprazole (PROTONIX) IV  40 mg Intravenous Q12H   PRN Medications: fentaNYL (SUBLIMAZE) injection, HYDROmorphone (DILAUDID) injection, HYDROmorphone  (DILAUDID) injection, ipratropium-albuterol, ondansetron (ZOFRAN) IV Hemodynamic parameters:   Intake/Output: 09/21 0701 - 09/22 0700 In: 1595.9 [I.V.:1414; IV Piggyback:181.9] Out: 810 [Urine:775; Emesis/NG output:35]  Ventilator  Settings:      LAB RESULTS:  Basic Metabolic Panel: Recent Labs  Lab 09/11/19 1041 09/12/19 0449 09/13/19 0612  NA 135 136 137  K 3.9 4.3 4.6  CL 101 107 107  CO2 26 21* 22  GLUCOSE 108* 207* 118*  BUN 16 18 26*  CREATININE 0.93 1.06* 0.93  CALCIUM 8.3* 8.2* 8.8*  MG  --  1.9  --   PHOS  --  3.2  --    Liver Function Tests: Recent Labs  Lab 09/11/19 1041 09/12/19 0449  AST 24 21  ALT 18 17  ALKPHOS 56 55  BILITOT 1.0 1.0  PROT 6.0* 6.1*  ALBUMIN 3.3* 3.5   No results for input(s): LIPASE, AMYLASE in the last 168 hours. No results for input(s): AMMONIA in the last 168 hours. CBC: Recent Labs  Lab 09/11/19 1041 09/12/19 0449 09/13/19 0612  WBC 11.8* 14.3* 12.5*  NEUTROABS 9.4*  --   --   HGB 12.8 12.8 11.6*  HCT 39.3 40.1 36.9  MCV 93.1 94.1 93.7  PLT 296 343 273   Cardiac Enzymes: No results for input(s): CKTOTAL, CKMB, CKMBINDEX, TROPONINI in the last 168 hours. BNP: Invalid input(s): POCBNP CBG: Recent Labs  Lab 09/12/19 0149  GLUCAP 163*     IMAGING RESULTS:  Imaging: Ct Head Wo Contrast  Result Date: 09/11/2019 CLINICAL DATA:  Head trauma, patient on anticoagulation EXAM: CT HEAD WITHOUT CONTRAST TECHNIQUE: Contiguous axial images were obtained from the base of the skull through the vertex without intravenous contrast. COMPARISON:  09/13/2013 FINDINGS: Brain: No evidence of acute infarction, hemorrhage, extra-axial collection, ventriculomegaly, or mass effect. Vascular: Cerebrovascular atherosclerotic calcifications are noted. Skull: Negative for fracture or focal lesion. Sinuses/Orbits: Visualized portions of the orbits are unremarkable. Visualized portions of the paranasal sinuses and mastoid air cells are  unremarkable. Other: None. IMPRESSION: No acute intracranial pathology. Electronically Signed   By: Kathreen Devoid   On: 09/11/2019 12:51   Ct Abdomen Pelvis W Contrast  Result Date: 09/11/2019 CLINICAL DATA:  Blunt abdominal trauma. Status post fall yesterday. Now with nausea, vomiting and body aches. EXAM: CT ABDOMEN AND PELVIS WITH CONTRAST TECHNIQUE: Multidetector CT imaging of the abdomen and pelvis was performed using the standard protocol following bolus administration of intravenous contrast. CONTRAST:  145mL OMNIPAQUE IOHEXOL 300 MG/ML  SOLN COMPARISON:  None. FINDINGS: Lower chest: No acute abnormality. Hepatobiliary: Small hypodense foci within the RIGHT liver lobe, too small to definitively characterize but most suggestive of benign cysts. No acute or suspicious findings within the liver. Gallbladder appears normal. No bile duct dilatation. Pancreas: Unremarkable. No pancreatic ductal dilatation or surrounding inflammatory changes. Spleen: Normal in size without focal abnormality. Adrenals/Urinary Tract: Adrenal glands appear normal. Kidneys appear normal without mass, stone or hydronephrosis. No ureteral or bladder calculi identified. Bladder is decompressed. Stomach/Bowel: Extensive diverticulosis of the LEFT colon but no focal inflammatory change to suggest acute diverticulitis. Edematous thickening of the distal small bowel (ileum) with associated fluid stranding/inflammation in the surrounding mesentery. No pneumatosis intestinalis appreciated. More proximal small bowel is normal in caliber. No other site of bowel wall thickening/inflammation. Stomach is unremarkable, partially decompressed. Appendix is normal. Vascular/Lymphatic: Aortic atherosclerosis. No acute appearing vascular abnormality. No enlarged lymph nodes seen. Reproductive: Uterus and bilateral adnexa are unremarkable. Other: Small amount of free fluid in the lower abdomen and pelvis. No circumscribed collection or abscess like  collection seen. No free intraperitoneal air. Musculoskeletal: No acute or suspicious osseous finding. Degenerative spondylosis of the thoracolumbar spine, mild to moderate in degree. IMPRESSION: 1. Pronounced edematous thickening of the walls of the distal small bowel (ileum) with associated fluid stranding/inflammation in the surrounding mesentery. Findings are consistent with a focal small bowel enteritis of infectious, inflammatory or ischemic nature. Given patient's diffuse aortic atherosclerosis, I FAVOR ISCHEMIC BOWEL. Next most likely differential would be a focal severe infectious enteritis. Given patient's age, Crohn's disease is considered less likely. 2. Small amount of free fluid in the lower abdomen and pelvis. No circumscribed collection or abscess like collection seen. No free intraperitoneal air. 3. Extensive diverticulosis of the LEFT colon without evidence of acute diverticulitis. 4. Aortic atherosclerosis. Aortic Atherosclerosis (ICD10-I70.0). Electronically Signed   By: Franki Cabot M.D.   On: 09/11/2019 12:54   Dg Chest Port 1 View  Result Date: 09/11/2019 CLINICAL DATA:  Fall, abdominal pain, tachycardia EXAM: PORTABLE CHEST 1 VIEW COMPARISON:  CTA chest dated 06/29/2016 FINDINGS: Lungs are clear.  No pleural effusion or pneumothorax. The heart is top-normal in size.  Thoracic aortic atherosclerosis.  IMPRESSION: No evidence of acute cardiopulmonary disease. Thoracic aortic atherosclerosis. Electronically Signed   By: Julian Hy M.D.   On: 09/11/2019 11:45   Ct Angio Abd/pel W And/or Wo Contrast  Result Date: 09/11/2019 CLINICAL DATA:  Possible ischemic bowel EXAM: CTA ABDOMEN AND PELVIS WITH CONTRAST TECHNIQUE: Multidetector CT imaging of the abdomen and pelvis was performed using the standard protocol during bolus administration of intravenous contrast. Multiplanar reconstructed images and MIPs were obtained and reviewed to evaluate the vascular anatomy. CONTRAST:  171mL  OMNIPAQUE IOHEXOL 350 MG/ML SOLN COMPARISON:  Scan from earlier the same day FINDINGS: VASCULAR Aorta: Moderate calcified atheromatous plaque throughout. No aneurysm, dissection, or stenosis. Celiac: Calcified ostial plaque resulting in short segment stenosis of at least mild severity. Patent distally. SMA: Calcified ostial plaque resulting in short segment stenosis of at least mild severity. Widely patent distally. Classic distal branch anatomy. Renals: Single left, widely patent. Single right, with ostial plaque resulting in only mild stenosis, widely patent distally. IMA: Patent, diminutive. Inflow: Calcified nonocclusive plaque through bilateral common iliac arteries. Internal and external iliac arteries unremarkable. Proximal Outflow: Bilateral common femoral and visualized portions of the superficial and profunda femoral arteries are patent without evidence of aneurysm, dissection, vasculitis or significant stenosis. Veins: Patent portal vein, SMV, splenic vein, bilateral renal veins. No venous pathology is evident. Delayed venous phase was demonstrated on the previous study. Review of the MIP images confirms the above findings. NON-VASCULAR Lower chest: Linear scarring or subsegmental atelectasis in the visualized lung bases. No pleural or pericardial effusion. Hepatobiliary: Stable low-attenuation right lobe lesions, statistically most likely cysts in the absence of a history of primary carcinoma, but incompletely characterized. Gallbladder physiologically distended. No biliary ductal dilatation. Pancreas: Unremarkable. No pancreatic ductal dilatation or surrounding inflammatory changes. Spleen: Normal in size without focal abnormality. Adrenals/Urinary Tract: Normal adrenals. Stomach/Bowel: Stomach is decompressed. Small bowel is nondilated. Persistent segment of circumferential wall thickening in the distal ileum over length of at least 30 cm extending nearly to the terminal ileum. Inflammatory/edematous  changes in the adjacent mesentery with small regional pockets of interloop ascites without evidence of loculation or peripheral enhancement. Ileocecal valve unremarkable. Appendix not well seen. The colon is nondilated with scattered diverticula from distal descending and sigmoid segment. Lymphatic: No abdominal or pelvic adenopathy. Reproductive: Uterus and bilateral adnexa are unremarkable. Other: Small volume right lower quadrant pelvic ascites as above. No free air. Musculoskeletal: Multilevel spondylitic changes in the lumbar spine. No fracture or worrisome bone lesion. IMPRESSION: 1. 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 If there is continued clinical concern of ischemic bowel, arteriogram with pressure measurements across the stenoses may be useful to better define their hemodynamic significance, as they may be approachable for percutaneous intervention if indicated. 2. Persistent circumferential wall thickening in the distal ileum with regional inflammatory/edematous changes and mesenteric fluid; no abscess. Inflammatory bowel disease, infectious/inflammatory enteritis, or ischemia could give this appearance. 3. Descending and sigmoid diverticulosis. Electronically Signed   By: Lucrezia Europe M.D.   On: 09/11/2019 15:58   @[email or web address] @   ASSESSMENT AND PLAN    -Multidisciplinary rounds held today ASSESSMENT  Intestinal ischemia status post diagnostic laparoscopy with small bowel and large bowel resection and wound VAC placement Atrial fibrillation with rapid ventricular rate COPD Obstructive sleep apnea Pulmonary hypertension Hypertension   PLAN Hemodynamic monitoring per ICU protocol Mandatory nocturnal BiPAP or CPAP ABG and chest x-ray as needed Continue diltiazem infusion to maintain heart  rate less than 100 bpm Heparin infusion per vascular surgery and general surgery Monitor wound VAC output and notify  surgery Antibiotics as above for intra-abdominal infection prophylaxis NG tube to low intermittent suction Keep n.p.o. until advised by surgery Cardiology, general surgery and vascular following Nebulized bronchodilators Best Practice: Code Status: Full code Diet: N.p.o. GI prophylaxis: Protonix VTE prophylaxis: SCDs and heparin infusion    GI/Nutrition GI PROPHYLAXIS as indicated DIET-->TF's as tolerated Constipation protocol as indicated  ENDO - ICU hypoglycemic\Hyperglycemia protocol -check FSBS per protocol   ELECTROLYTES -follow labs as needed -replace as needed -pharmacy consultation   DVT/GI PRX ordered -SCDs  TRANSFUSIONS AS NEEDED MONITOR FSBS ASSESS the need for LABS as needed   Critical care provider statement:    Critical care time (minutes):  32   Critical care time was exclusive of:  Separately billable procedures and treating other patients   Critical care was necessary to treat or prevent imminent or life-threatening deterioration of the following conditions:  necrotic bowel ischemia. Multiple comorbid conditions   Critical care was time spent personally by me on the following activities:  Development of treatment plan with patient or surrogate, discussions with consultants, evaluation of patient's response to treatment, examination of patient, obtaining history from patient or surrogate, ordering and performing treatments and interventions, ordering and review of laboratory studies and re-evaluation of patient's condition.  I assumed direction of critical care for this patient from another provider in my specialty: no    This document was prepared using Dragon voice recognition software and may include unintentional dictation errors.    Ottie Glazier, M.D.  Division of [place]

## 2019-09-13 NOTE — Consult Note (Signed)
[facility] NOTE   Pharmacy Consult for: Heparin Indication: Atrial fibrillation  Allergies  Allergen Reactions  . Flecainide Shortness Of Breath  . Propafenone Shortness Of Breath and Swelling  . Rivaroxaban Other (See Comments)    Patient Measurements: Height: 5\' 4"  (162.6 cm) Weight: 283 lb 1.1 oz (128.4 kg) IBW/kg (Calculated) : 54.7 Heparin Dosing Weight: 86.4 kg   Vital Signs: Temp: 98.5 F (36.9 C) (09/22 1356) Temp Source: Oral (09/22 0800) BP: 122/72 (09/22 1428) Pulse Rate: 102 (09/22 1415)  Labs: Recent Labs    09/11/19 1041 09/11/19 1711  09/12/19 0449 09/12/19 1258 09/12/19 2258 09/13/19 0612  HGB 12.8  --   --  12.8  --   --  11.6*  HCT 39.3  --   --  40.1  --   --  36.9  PLT 296  --   --  343  --   --  273  APTT  --  25   < >  --  31 39* 39*  LABPROT  --  15.4*  --   --   --   --   --   INR  --  1.2  --   --   --   --   --   HEPARINUNFRC  --  2.62*  --  1.65*  --   --  0.96*  CREATININE 0.93  --   --  1.06*  --   --  0.93  TROPONINIHS 9  --   --   --   --   --   --    < > = values in this interval not displayed.    Estimated Creatinine Clearance: 75.9 mL/min (by C-G formula based on SCr of 0.93 mg/dL).  Medications:  Eliquis 5 mg BID- last known dose 09/10/19 @ 2000  Assessment: Patient has a PMH significant for atrial fibrillation (on Eliquis), pulmonary HTN, PAD who presents with complaints of abdominal pain, N/V, and diarrhea. Patient found to have ischemic colitis now s/p small bowel resection and right colectomy which was left in discontinuity on 9/21. Return to OR on 9/22 for anastomosis. Patient on heparin drip this admission which has been held around surgeries.   Per surgeon ok to re-start heparin today at 1800. Following aPTTs for now due to apixaban interference with anti-Xa levels.   Goal of Therapy:  Heparin level 0.3-0.7 units/ml aPTT 66-102s seconds Monitor platelets by anticoagulation protocol: Yes   Plan:   Re-start heparin drip at previous rate of 1500 units/hr. Will not bolus for 24 hours following surgery. Next aPTT 6 hours after re-start of drip. Continue to follow daily CBC per protocol. Defer following anti-Xa levels for now.   [place] Resident 09/13/2019

## 2019-09-13 NOTE — Op Note (Addendum)
Preoperative diagnosis: Bowel ischemia  Postoperative diagnosis: Bowel ischemia  Procedure: Reopening of recent laparotomy                      Resection of previous small bowel and large bowel staple line                      Ileum to transverse colon anastomosis                      Delayed abdominal wall closure                      Implantation of negative pressure dressing  Anesthesia: GETA  Surgeon: Dr. Windell Moment, MD  Assistant Surgeon: Dr. Lysle Pearl  Wound Classification: Clean Contaminated  Indications: [name] with previous surgery due to bowel ischemia.  During the previous surgery small bowel and right colon was resected and left in discontinuity.  She had angiogram yesterday that shows adequate circulation to the rest of the bowel.  Today taken back to the OR for anastomosis of the small bowel to transverse colon and for closure of the abdominal wall.  Findings: 1.  Small bowel from Treitz to resected area with adequate appearance 2.  Transverse colon with adequate circulation 3.  No fluid collection or stool spillage 4.  Abdominal wall closure without tension 5.  Negative pressure dressing applied to decrease infection risk and improve wound healing 6.  Adequate hemostasis  Description of procedure:  The patient was placed in the supine position and general endotracheal anesthesia was induced. Sequential pneumatic compression devices were placed on lower extremities. Preoperative antibiotics were given. A Foley catheter and nasogastric tube were already placed. The abdomen was prepped and draped in the usual sterile fashion. A time-out was completed verifying correct patient, procedure, site, positioning, and implant(s) and/or special equipment prior to beginning this procedure.  The ABTHERA dressing was removed.  The small bowel was inspected from Treitz to the resected area.  There was no sign of ischemia.  The transverse colon was inspected and there  was no sign of ischemia either. The small bowel and large bowel staple line were resected to healthy tissue.  Hemostasis was checked in the operative field. The two ends of bowel were checked and found to be viable, with excellent blood supply. The proximal and distal segments were brought into apposition and found to lie comfortably next to each other. Two stay sutures of 3-0 Vicryl were placed to approximate the antimesenteric borders of the bowel segments. Enterotomies were made on the antimesenteric corner of the staple line on the ileum and transverse colon and the linear cutting stapler inserted and fired. Hemostasis was checked on the staple line. The enterotomies were then closed with a linear stapler. The anastomosis was checked and found to be intact and widely patent. Mesenteric defect was closed with interrupted 3-0 Vicryl. The abdominal cavity was then copiously irrigated and hemostasis was checked. Surgical tech and surgeons changed counseling without for clean closure.  Exparel mixture was infiltrated abdominal wall. The fascia was closed with a running suture of PDS 0. The skin was closed with staples.  The wound was covered with a negative pressure dressing applied in steps. The patient tolerated the procedure well and was taken to the postanesthesia care unit in stable condition.   Specimen: None  Complications: None  Estimated Blood Loss: Minimal

## 2019-09-13 NOTE — Progress Notes (Signed)
[facility] [place] NAME: Laura [name]    MR#:  [id number]  DATE OF BIRTH:  Jan 16, 1951  SUBJECTIVE:  CHIEF COMPLAINT:   Chief Complaint  Patient presents with   Abdominal Pain   Fall   Tachycardia   -Patient admitted with ischemic colitis, status post terminal ileum and right hemicolectomy done.  Plan for anastomosis today.  Has an abdominal wound VAC.  Complaining of abdominal pain -A. fib with RVR.  On Cardizem and heparin drips -Alert and oriented  REVIEW OF SYSTEMS:  Review of Systems  Constitutional: Positive for malaise/fatigue. Negative for chills and fever.  HENT: Negative for congestion, ear discharge, hearing loss and nosebleeds.   Eyes: Negative for blurred vision and double vision.  Respiratory: Negative for cough, shortness of breath and wheezing.   Cardiovascular: Negative for chest pain and palpitations.  Gastrointestinal: Positive for abdominal pain. Negative for constipation, diarrhea, nausea and vomiting.  Genitourinary: Negative for dysuria.  Musculoskeletal: Positive for myalgias.  Neurological: Positive for weakness. Negative for dizziness, focal weakness, seizures and headaches.  Psychiatric/Behavioral: Negative for depression.    DRUG ALLERGIES:   Allergies  Allergen Reactions   Flecainide Shortness Of Breath   Propafenone Shortness Of Breath and Swelling   Rivaroxaban Other (See Comments)    VITALS:  Blood pressure 112/81, pulse (!) 44, temperature 99 F (37.2 C), temperature source Oral, resp. rate (!) 21, height 5\' 4"  (1.626 m), weight 128.4 kg, SpO2 97 %.  PHYSICAL EXAMINATION:  Physical Exam  GENERAL:  68 y.o.-year-old patient lying in the bed, in distress due to abdominal pain.  EYES: Pupils equal, round, reactive to light and accommodation. No scleral icterus. Extraocular muscles intact.  HEENT: Head atraumatic, normocephalic. Oropharynx and nasopharynx clear.  NECK:  Supple, no  jugular venous distention. No thyroid enlargement, no tenderness.  LUNGS: Normal breath sounds bilaterally, no wheezing, rales,rhonchi or crepitation. No use of accessory muscles of respiration.  Decreased bibasilar breath sounds CARDIOVASCULAR: S1, S2 rapid rate and irregular rhythm. No  rubs, or gallops.  2/6 systolic murmur is present ABDOMEN: Mid abdominal suture with wound VAC in place, tender in the right lower quadrant.  Hypoactive bowel sounds present. No organomegaly or mass.  EXTREMITIES: No pedal edema, cyanosis, or clubbing.  NEUROLOGIC: Cranial nerves II through XII are intact. Muscle strength 5/5 in all extremities. Sensation intact. Gait not checked.  Global weakness noted PSYCHIATRIC: The patient is alert and oriented x 3.  SKIN: No obvious rash, lesion, or ulcer.    LABORATORY PANEL:   CBC Recent Labs  Lab 09/13/19 0612  WBC 12.5*  HGB 11.6*  HCT 36.9  PLT 273   ------------------------------------------------------------------------------------------------------------------  Chemistries  Recent Labs  Lab 09/12/19 0449 09/13/19 0612  NA 136 137  K 4.3 4.6  CL 107 107  CO2 21* 22  GLUCOSE 207* 118*  BUN 18 26*  CREATININE 1.06* 0.93  CALCIUM 8.2* 8.8*  MG 1.9  --   AST 21  --   ALT 17  --   ALKPHOS 55  --   BILITOT 1.0  --    ------------------------------------------------------------------------------------------------------------------  Cardiac Enzymes No results for input(s): TROPONINI in the last 168 hours. ------------------------------------------------------------------------------------------------------------------  RADIOLOGY:  Ct Head Wo Contrast  Result Date: 09/11/2019 CLINICAL DATA:  Head trauma, patient on anticoagulation EXAM: CT HEAD WITHOUT CONTRAST TECHNIQUE: Contiguous axial images were obtained from the base of the skull through the vertex without intravenous contrast. COMPARISON:  09/13/2013 FINDINGS: Brain:  No evidence of acute  infarction, hemorrhage, extra-axial collection, ventriculomegaly, or mass effect. Vascular: Cerebrovascular atherosclerotic calcifications are noted. Skull: Negative for fracture or focal lesion. Sinuses/Orbits: Visualized portions of the orbits are unremarkable. Visualized portions of the paranasal sinuses and mastoid air cells are unremarkable. Other: None. IMPRESSION: No acute intracranial pathology. Electronically Signed   By: Kathreen Devoid   On: 09/11/2019 12:51   Ct Abdomen Pelvis W Contrast  Result Date: 09/11/2019 CLINICAL DATA:  Blunt abdominal trauma. Status post fall yesterday. Now with nausea, vomiting and body aches. EXAM: CT ABDOMEN AND PELVIS WITH CONTRAST TECHNIQUE: Multidetector CT imaging of the abdomen and pelvis was performed using the standard protocol following bolus administration of intravenous contrast. CONTRAST:  17mL OMNIPAQUE IOHEXOL 300 MG/ML  SOLN COMPARISON:  None. FINDINGS: Lower chest: No acute abnormality. Hepatobiliary: Small hypodense foci within the RIGHT liver lobe, too small to definitively characterize but most suggestive of benign cysts. No acute or suspicious findings within the liver. Gallbladder appears normal. No bile duct dilatation. Pancreas: Unremarkable. No pancreatic ductal dilatation or surrounding inflammatory changes. Spleen: Normal in size without focal abnormality. Adrenals/Urinary Tract: Adrenal glands appear normal. Kidneys appear normal without mass, stone or hydronephrosis. No ureteral or bladder calculi identified. Bladder is decompressed. Stomach/Bowel: Extensive diverticulosis of the LEFT colon but no focal inflammatory change to suggest acute diverticulitis. Edematous thickening of the distal small bowel (ileum) with associated fluid stranding/inflammation in the surrounding mesentery. No pneumatosis intestinalis appreciated. More proximal small bowel is normal in caliber. No other site of bowel wall thickening/inflammation. Stomach is unremarkable,  partially decompressed. Appendix is normal. Vascular/Lymphatic: Aortic atherosclerosis. No acute appearing vascular abnormality. No enlarged lymph nodes seen. Reproductive: Uterus and bilateral adnexa are unremarkable. Other: Small amount of free fluid in the lower abdomen and pelvis. No circumscribed collection or abscess like collection seen. No free intraperitoneal air. Musculoskeletal: No acute or suspicious osseous finding. Degenerative spondylosis of the thoracolumbar spine, mild to moderate in degree. IMPRESSION: 1. Pronounced edematous thickening of the walls of the distal small bowel (ileum) with associated fluid stranding/inflammation in the surrounding mesentery. Findings are consistent with a focal small bowel enteritis of infectious, inflammatory or ischemic nature. Given patient's diffuse aortic atherosclerosis, I FAVOR ISCHEMIC BOWEL. Next most likely differential would be a focal severe infectious enteritis. Given patient's age, Crohn's disease is considered less likely. 2. Small amount of free fluid in the lower abdomen and pelvis. No circumscribed collection or abscess like collection seen. No free intraperitoneal air. 3. Extensive diverticulosis of the LEFT colon without evidence of acute diverticulitis. 4. Aortic atherosclerosis. Aortic Atherosclerosis (ICD10-I70.0). Electronically Signed   By: Franki Cabot M.D.   On: 09/11/2019 12:54   Dg Chest Port 1 View  Result Date: 09/11/2019 CLINICAL DATA:  Fall, abdominal pain, tachycardia EXAM: PORTABLE CHEST 1 VIEW COMPARISON:  CTA chest dated 06/29/2016 FINDINGS: Lungs are clear.  No pleural effusion or pneumothorax. The heart is top-normal in size.  Thoracic aortic atherosclerosis. IMPRESSION: No evidence of acute cardiopulmonary disease. Thoracic aortic atherosclerosis. Electronically Signed   By: Julian Hy M.D.   On: 09/11/2019 11:45   Ct Angio Abd/pel W And/or Wo Contrast  Result Date: 09/11/2019 CLINICAL DATA:  Possible ischemic  bowel EXAM: CTA ABDOMEN AND PELVIS WITH CONTRAST TECHNIQUE: Multidetector CT imaging of the abdomen and pelvis was performed using the standard protocol during bolus administration of intravenous contrast. Multiplanar reconstructed images and MIPs were obtained and reviewed to evaluate the vascular anatomy. CONTRAST:  170mL OMNIPAQUE IOHEXOL 350  MG/ML SOLN COMPARISON:  Scan from earlier the same day FINDINGS: VASCULAR Aorta: Moderate calcified atheromatous plaque throughout. No aneurysm, dissection, or stenosis. Celiac: Calcified ostial plaque resulting in short segment stenosis of at least mild severity. Patent distally. SMA: Calcified ostial plaque resulting in short segment stenosis of at least mild severity. Widely patent distally. Classic distal branch anatomy. Renals: Single left, widely patent. Single right, with ostial plaque resulting in only mild stenosis, widely patent distally. IMA: Patent, diminutive. Inflow: Calcified nonocclusive plaque through bilateral common iliac arteries. Internal and external iliac arteries unremarkable. Proximal Outflow: Bilateral common femoral and visualized portions of the superficial and profunda femoral arteries are patent without evidence of aneurysm, dissection, vasculitis or significant stenosis. Veins: Patent portal vein, SMV, splenic vein, bilateral renal veins. No venous pathology is evident. Delayed venous phase was demonstrated on the previous study. Review of the MIP images confirms the above findings. NON-VASCULAR Lower chest: Linear scarring or subsegmental atelectasis in the visualized lung bases. No pleural or pericardial effusion. Hepatobiliary: Stable low-attenuation right lobe lesions, statistically most likely cysts in the absence of a history of primary carcinoma, but incompletely characterized. Gallbladder physiologically distended. No biliary ductal dilatation. Pancreas: Unremarkable. No pancreatic ductal dilatation or surrounding inflammatory changes.  Spleen: Normal in size without focal abnormality. Adrenals/Urinary Tract: Normal adrenals. Stomach/Bowel: Stomach is decompressed. Small bowel is nondilated. Persistent segment of circumferential wall thickening in the distal ileum over length of at least 30 cm extending nearly to the terminal ileum. Inflammatory/edematous changes in the adjacent mesentery with small regional pockets of interloop ascites without evidence of loculation or peripheral enhancement. Ileocecal valve unremarkable. Appendix not well seen. The colon is nondilated with scattered diverticula from distal descending and sigmoid segment. Lymphatic: No abdominal or pelvic adenopathy. Reproductive: Uterus and bilateral adnexa are unremarkable. Other: Small volume right lower quadrant pelvic ascites as above. No free air. Musculoskeletal: Multilevel spondylitic changes in the lumbar spine. No fracture or worrisome bone lesion. IMPRESSION: 1. 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 If there is continued clinical concern of ischemic bowel, arteriogram with pressure measurements across the stenoses may be useful to better define their hemodynamic significance, as they may be approachable for percutaneous intervention if indicated. 2. Persistent circumferential wall thickening in the distal ileum with regional inflammatory/edematous changes and mesenteric fluid; no abscess. Inflammatory bowel disease, infectious/inflammatory enteritis, or ischemia could give this appearance. 3. Descending and sigmoid diverticulosis. Electronically Signed   By: Lucrezia Europe M.D.   On: 09/11/2019 15:58    EKG:   Orders placed or performed during the hospital encounter of 09/11/19   EKG 12-Lead   EKG 12-Lead   EKG 12-Lead   EKG 12-Lead   EKG 12-Lead   EKG 12-Lead   ED EKG   ED EKG    ASSESSMENT AND PLAN:   68 year old female with past medical history significant for chronic A.  fib on sleep apnea, PAD, hypertension presents to hospital secondary to abdominal pain and noted to have ischemic colitis.  1.  Ischemic colitis-appreciate general surgery consult and vascular consult -Patient had surgery last night with ileum and right colectomy, postop day 2 -going for anastomosis today. -Mesenteric angiogram with no major vessel stenosis.  Done yesterday. -Has an abdominal wound VAC in place. -Has an NG tube.  On IV antibiotics with Zosyn -Continue further management per surgical team  2.  Atrial fibrillation with RVR-since n.p.o., patient on Cardizem drip.  Also on heparin drip-heparin  drip held this morning for surgery. -Appreciate cardiology consult  3.  GERD-Protonix  4.  Sleep apnea-continue CPAP  5.  DVT prophylaxis-patient already on heparin drip. eliquis held   Encourage ambulation after surgery Discussed with surgeon.    All the records are reviewed and case discussed with Care Management/Social Workerr. Management plans discussed with the patient, family and they are in agreement.  CODE STATUS: Full code  TOTAL TIME TAKING CARE OF THIS PATIENT: 37 minutes.   POSSIBLE D/C IN 3-4 DAYS, DEPENDING ON CLINICAL CONDITION.   Gladstone Lighter M.D on 09/13/2019 at 9:10 AM  Between 7am to 6pm - Pager - [phone]  After 6pm go to [email or web address] - password [email or web address] [facility]  Office  [phone]  CC: Primary care physician; [name], MD

## 2019-09-13 NOTE — Transfer of Care (Signed)
Immediate Anesthesia Transfer of Care Note  Patient: Laura [name]  Procedure(s) Performed: REOPENING OF RECENT LAPAROTOMYANASTOMOSIS OF BOWEL (N/A )  Patient Location: PACU  Anesthesia Type:General  Level of Consciousness: awake and drowsy  Airway & Oxygen Therapy: Patient Spontanous Breathing and Patient connected to face mask oxygen  Post-op Assessment: Report given to RN and Post -op Vital signs reviewed and stable  Post vital signs: stable  Last Vitals:  Vitals Value Taken Time  BP 133/80 09/13/19 1357  Temp 36.9 C 09/13/19 1356  Pulse 108 09/13/19 1404  Resp 16 09/13/19 1404  SpO2 100 % 09/13/19 1404  Vitals shown include unvalidated device data.  Last Pain:  Vitals:   09/13/19 1356  TempSrc:   PainSc: 0-No pain         Complications: No apparent anesthesia complications

## 2019-09-13 NOTE — Progress Notes (Signed)
15 minute call to floor. 

## 2019-09-13 NOTE — Progress Notes (Signed)
[facility] Daily Progress Note   Subjective: 1 Day Post-Op: 1. Ultrasound guidance for vascular access right femoral artery  2. Catheter placement into celiac artery, SMA, and IMA from right femoral approach 3. Aortogram and selective angiogram of the celiac artery, SMA, and IMA 4. StarClose closure device right femoral artery  Patient without complaint this a.m.  Surgery this afternoon.  No issues overnight.  Objective: Vitals:   09/13/19 0600 09/13/19 0700 09/13/19 0800 09/13/19 0900  BP: 125/85 128/89 112/81 (!) 122/51  Pulse: 83 86 (!) 44 90  Resp: 12 16 (!) 21 15  Temp:   99 F (37.2 C)   TempSrc:   Oral   SpO2: 96% 94% 97% 100%  Weight:      Height:        Intake/Output Summary (Last 24 hours) at 09/13/2019 1105 Last data filed at 09/13/2019 0858 Gross per 24 hour  Intake 1561.65 ml  Output 2120 ml  Net -558.35 ml   Physical Exam: A&Ox3, NAD CV: Afib Pulmonary: CTA Bilaterally Abdomen: Soft, Nontender, Nondistended             OG Tube: in place, sumping             VAC: Intact to suction Right Groin: PAD in place Vascular:              Bilateral lower extremities: soft, non-tender, warm distally to toes   Laboratory: CBC    Component Value Date/Time   WBC 12.5 (H) 09/13/2019 0612   HGB 11.6 (L) 09/13/2019 0612   HGB 13.2 01/28/2019 1028   HCT 36.9 09/13/2019 0612   HCT 38.6 01/28/2019 1028   PLT 273 09/13/2019 0612   PLT 350 01/28/2019 1028   BMET    Component Value Date/Time   NA 137 09/13/2019 0612   NA 136 01/28/2019 1028   NA 140 11/28/2013 2312   K 4.6 09/13/2019 0612   K 4.1 11/28/2013 2312   CL 107 09/13/2019 0612   CL 108 (H) 11/28/2013 2312   CO2 22 09/13/2019 0612   CO2 27 11/28/2013 2312   GLUCOSE 118 (H) 09/13/2019 0612   GLUCOSE 130 (H) 11/28/2013 2312   BUN 26 (H) 09/13/2019 0612   BUN 30 (H) 01/28/2019 1028   BUN 29 (H) 11/28/2013 2312   CREATININE  0.93 09/13/2019 0612   CREATININE 0.79 11/28/2013 2312   CALCIUM 8.8 (L) 09/13/2019 0612   CALCIUM 8.7 11/28/2013 2312   GFRNONAA >60 09/13/2019 0612   GFRNONAA >60 11/28/2013 2312   GFRAA >60 09/13/2019 0612   GFRAA >60 11/28/2013 2312   Assessment/Planning: The patient is a 68 year old female who presented with ischemic bowel now s/p resection - POD #2 1) Mesenteric angiogram without any stenosis / occlusion of the celiac, SMA or IMA.  No intervention indicated. 2)  Remove PAD today 3)  Vascular will sign off at this time.  Please reconsult if needed.  Discussed with Dr. Ellis Parents Stegmayer PA-C 09/13/2019 11:05 AM

## 2019-09-13 NOTE — Progress Notes (Signed)
Progress Note  Patient Name: ELIDIA ROSEN Date of Encounter: 09/13/2019  Primary Cardiologist: Kathlyn Sacramento, MD   Subjective   Patient reports continued abdominal pain this morning, especially with coughing and moving in bed.  She had transient left upper chest pain last night.  No shortness of breath or palpitations.  Inpatient Medications    Scheduled Meds: . Chlorhexidine Gluconate Cloth  6 each Topical Daily  . metoprolol tartrate  5 mg Intravenous Q8H  . pantoprazole (PROTONIX) IV  40 mg Intravenous Q12H   Continuous Infusions: . sodium chloride 100 mL/hr at 09/12/19 1400  . diltiazem (CARDIZEM) infusion 5 mg/hr (09/13/19 0737)  . piperacillin-tazobactam (ZOSYN)  IV 3.375 g (09/13/19 0307)   PRN Meds: fentaNYL (SUBLIMAZE) injection, HYDROmorphone (DILAUDID) injection, HYDROmorphone (DILAUDID) injection, ipratropium-albuterol, ondansetron (ZOFRAN) IV   Vital Signs    Vitals:   09/13/19 0400 09/13/19 0500 09/13/19 0600 09/13/19 0700  BP: (!) 136/96 (!) 125/91 125/85 128/89  Pulse: 93 70 83 86  Resp: 15 (!) 24 12 16   Temp: 98.9 F (37.2 C)     TempSrc: Oral     SpO2: 100% (!) 88% 96% 94%  Weight:      Height:        Intake/Output Summary (Last 24 hours) at 09/13/2019 0830 Last data filed at 09/13/2019 0737 Gross per 24 hour  Intake 1582.75 ml  Output 2120 ml  Net -537.25 ml   Last 3 Weights 09/12/2019 09/12/2019 09/11/2019  Weight (lbs) 283 lb 1.1 oz 283 lb 1.1 oz 285 lb 12.8 oz  Weight (kg) 128.4 kg 128.4 kg 129.638 kg      Telemetry    Atrial fibrillation with ventricular rates of 70 to 115 bpm- Personally Reviewed  ECG    No new tracing  Physical Exam   GEN: No acute distress.   Neck:  Unable to assess JVP due to body habitus. Cardiac:  Irregularly irregular without murmurs. Respiratory:  Clear anteriorly. GI:  Wound VAC in place.  Diffuse tenderness noted.  Hypoactive bowel sounds. MS: Trace pretibial edema; No deformity. Neuro:  Nonfocal   Psych: Normal affect   Labs    High Sensitivity Troponin:   Recent Labs  Lab 09/11/19 1041  TROPONINIHS 9      Chemistry Recent Labs  Lab 09/11/19 1041 09/12/19 0449 09/13/19 0612  NA 135 136 137  K 3.9 4.3 4.6  CL 101 107 107  CO2 26 21* 22  GLUCOSE 108* 207* 118*  BUN 16 18 26*  CREATININE 0.93 1.06* 0.93  CALCIUM 8.3* 8.2* 8.8*  PROT 6.0* 6.1*  --   ALBUMIN 3.3* 3.5  --   AST 24 21  --   ALT 18 17  --   ALKPHOS 56 55  --   BILITOT 1.0 1.0  --   GFRNONAA >60 54* >60  GFRAA >60 >60 >60  ANIONGAP 8 8 8      Hematology Recent Labs  Lab 09/11/19 1041 09/12/19 0449 09/13/19 0612  WBC 11.8* 14.3* 12.5*  RBC 4.22 4.26 3.94  HGB 12.8 12.8 11.6*  HCT 39.3 40.1 36.9  MCV 93.1 94.1 93.7  MCH 30.3 30.0 29.4  MCHC 32.6 31.9 31.4  RDW 13.8 14.1 14.0  PLT 296 343 273    BNPNo results for input(s): BNP, PROBNP in the last 168 hours.   DDimer No results for input(s): DDIMER in the last 168 hours.   Radiology    Ct Head Wo Contrast  Result Date: 09/11/2019 CLINICAL  DATA:  Head trauma, patient on anticoagulation EXAM: CT HEAD WITHOUT CONTRAST TECHNIQUE: Contiguous axial images were obtained from the base of the skull through the vertex without intravenous contrast. COMPARISON:  09/13/2013 FINDINGS: Brain: No evidence of acute infarction, hemorrhage, extra-axial collection, ventriculomegaly, or mass effect. Vascular: Cerebrovascular atherosclerotic calcifications are noted. Skull: Negative for fracture or focal lesion. Sinuses/Orbits: Visualized portions of the orbits are unremarkable. Visualized portions of the paranasal sinuses and mastoid air cells are unremarkable. Other: None. IMPRESSION: No acute intracranial pathology. Electronically Signed   By: Kathreen Devoid   On: 09/11/2019 12:51   Ct Abdomen Pelvis W Contrast  Result Date: 09/11/2019 CLINICAL DATA:  Blunt abdominal trauma. Status post fall yesterday. Now with nausea, vomiting and body aches. EXAM: CT ABDOMEN  AND PELVIS WITH CONTRAST TECHNIQUE: Multidetector CT imaging of the abdomen and pelvis was performed using the standard protocol following bolus administration of intravenous contrast. CONTRAST:  199mL OMNIPAQUE IOHEXOL 300 MG/ML  SOLN COMPARISON:  None. FINDINGS: Lower chest: No acute abnormality. Hepatobiliary: Small hypodense foci within the RIGHT liver lobe, too small to definitively characterize but most suggestive of benign cysts. No acute or suspicious findings within the liver. Gallbladder appears normal. No bile duct dilatation. Pancreas: Unremarkable. No pancreatic ductal dilatation or surrounding inflammatory changes. Spleen: Normal in size without focal abnormality. Adrenals/Urinary Tract: Adrenal glands appear normal. Kidneys appear normal without mass, stone or hydronephrosis. No ureteral or bladder calculi identified. Bladder is decompressed. Stomach/Bowel: Extensive diverticulosis of the LEFT colon but no focal inflammatory change to suggest acute diverticulitis. Edematous thickening of the distal small bowel (ileum) with associated fluid stranding/inflammation in the surrounding mesentery. No pneumatosis intestinalis appreciated. More proximal small bowel is normal in caliber. No other site of bowel wall thickening/inflammation. Stomach is unremarkable, partially decompressed. Appendix is normal. Vascular/Lymphatic: Aortic atherosclerosis. No acute appearing vascular abnormality. No enlarged lymph nodes seen. Reproductive: Uterus and bilateral adnexa are unremarkable. Other: Small amount of free fluid in the lower abdomen and pelvis. No circumscribed collection or abscess like collection seen. No free intraperitoneal air. Musculoskeletal: No acute or suspicious osseous finding. Degenerative spondylosis of the thoracolumbar spine, mild to moderate in degree. IMPRESSION: 1. Pronounced edematous thickening of the walls of the distal small bowel (ileum) with associated fluid stranding/inflammation in  the surrounding mesentery. Findings are consistent with a focal small bowel enteritis of infectious, inflammatory or ischemic nature. Given patient's diffuse aortic atherosclerosis, I FAVOR ISCHEMIC BOWEL. Next most likely differential would be a focal severe infectious enteritis. Given patient's age, Crohn's disease is considered less likely. 2. Small amount of free fluid in the lower abdomen and pelvis. No circumscribed collection or abscess like collection seen. No free intraperitoneal air. 3. Extensive diverticulosis of the LEFT colon without evidence of acute diverticulitis. 4. Aortic atherosclerosis. Aortic Atherosclerosis (ICD10-I70.0). Electronically Signed   By: Franki Cabot M.D.   On: 09/11/2019 12:54   Dg Chest Port 1 View  Result Date: 09/11/2019 CLINICAL DATA:  Fall, abdominal pain, tachycardia EXAM: PORTABLE CHEST 1 VIEW COMPARISON:  CTA chest dated 06/29/2016 FINDINGS: Lungs are clear.  No pleural effusion or pneumothorax. The heart is top-normal in size.  Thoracic aortic atherosclerosis. IMPRESSION: No evidence of acute cardiopulmonary disease. Thoracic aortic atherosclerosis. Electronically Signed   By: Julian Hy M.D.   On: 09/11/2019 11:45   Ct Angio Abd/pel W And/or Wo Contrast  Result Date: 09/11/2019 CLINICAL DATA:  Possible ischemic bowel EXAM: CTA ABDOMEN AND PELVIS WITH CONTRAST TECHNIQUE: Multidetector CT imaging of the abdomen  and pelvis was performed using the standard protocol during bolus administration of intravenous contrast. Multiplanar reconstructed images and MIPs were obtained and reviewed to evaluate the vascular anatomy. CONTRAST:  123mL OMNIPAQUE IOHEXOL 350 MG/ML SOLN COMPARISON:  Scan from earlier the same day FINDINGS: VASCULAR Aorta: Moderate calcified atheromatous plaque throughout. No aneurysm, dissection, or stenosis. Celiac: Calcified ostial plaque resulting in short segment stenosis of at least mild severity. Patent distally. SMA: Calcified ostial  plaque resulting in short segment stenosis of at least mild severity. Widely patent distally. Classic distal branch anatomy. Renals: Single left, widely patent. Single right, with ostial plaque resulting in only mild stenosis, widely patent distally. IMA: Patent, diminutive. Inflow: Calcified nonocclusive plaque through bilateral common iliac arteries. Internal and external iliac arteries unremarkable. Proximal Outflow: Bilateral common femoral and visualized portions of the superficial and profunda femoral arteries are patent without evidence of aneurysm, dissection, vasculitis or significant stenosis. Veins: Patent portal vein, SMV, splenic vein, bilateral renal veins. No venous pathology is evident. Delayed venous phase was demonstrated on the previous study. Review of the MIP images confirms the above findings. NON-VASCULAR Lower chest: Linear scarring or subsegmental atelectasis in the visualized lung bases. No pleural or pericardial effusion. Hepatobiliary: Stable low-attenuation right lobe lesions, statistically most likely cysts in the absence of a history of primary carcinoma, but incompletely characterized. Gallbladder physiologically distended. No biliary ductal dilatation. Pancreas: Unremarkable. No pancreatic ductal dilatation or surrounding inflammatory changes. Spleen: Normal in size without focal abnormality. Adrenals/Urinary Tract: Normal adrenals. Stomach/Bowel: Stomach is decompressed. Small bowel is nondilated. Persistent segment of circumferential wall thickening in the distal ileum over length of at least 30 cm extending nearly to the terminal ileum. Inflammatory/edematous changes in the adjacent mesentery with small regional pockets of interloop ascites without evidence of loculation or peripheral enhancement. Ileocecal valve unremarkable. Appendix not well seen. The colon is nondilated with scattered diverticula from distal descending and sigmoid segment. Lymphatic: No abdominal or pelvic  adenopathy. Reproductive: Uterus and bilateral adnexa are unremarkable. Other: Small volume right lower quadrant pelvic ascites as above. No free air. Musculoskeletal: Multilevel spondylitic changes in the lumbar spine. No fracture or worrisome bone lesion. IMPRESSION: 1. 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 If there is continued clinical concern of ischemic bowel, arteriogram with pressure measurements across the stenoses may be useful to better define their hemodynamic significance, as they may be approachable for percutaneous intervention if indicated. 2. Persistent circumferential wall thickening in the distal ileum with regional inflammatory/edematous changes and mesenteric fluid; no abscess. Inflammatory bowel disease, infectious/inflammatory enteritis, or ischemia could give this appearance. 3. Descending and sigmoid diverticulosis. Electronically Signed   By: Lucrezia Europe M.D.   On: 09/11/2019 15:58    Cardiac Studies   Outside TTE (07/29/2019): Mild asymmetric LVH with LVEF of 60%.  Biatrial enlargement.  Atrial annular calcification with mild MR.  Mild TR.  Patient Profile     68 y.o. female with history of chronic atrial fibrillation, hypertension, COPD, OSA, and PAD, admitted with severe abdominal pain secondary to ischemic bowel.  Assessment & Plan    Chronic atrial fibrillation: Ventricular rates remain variable but overall under reasonable control (goal less than 110 bpm at rest).  Continue diltiazem infusion, IV metoprolol, and IV heparin.  Plan to transition back to oral diltiazem and apixaban once tolerating diet and no further surgical intervention planned.  Acute ischemic colitis: Status post surgical intervention with plan for closure of incision today.  Mesenteric angiogram yesterday revealed no significant stenosis.  Ongoing management per surgery.  Hypertension: Pressure labile.  Continue to hold oral  medications.  Titrate diltiazem and IV metoprolol for adequate ventricular rate control, as blood pressure allows.  For questions or updates, please contact [place] Please consult [email or web address] for contact info under [facility].     Signed, Nelva Bush, MD  09/13/2019, 8:30 AM

## 2019-09-13 NOTE — Progress Notes (Signed)
Prevana alarming, no obvious leak, but loss of suction to wound. Unable to get devie functioning properly again, Dr. Dahlia Byes, on call surgeon, paged. Awaiting call back.

## 2019-09-13 NOTE — Interval H&P Note (Signed)
Patient with acute ischemia of the distal ileum on today terminal ileum.  She had recent small bowel resection and right colectomy.  Due to the concern of possibility of progressive ischemia the bowel was left in discontinuity.  Angiogram shows that the SMA and celiac has adequate circulation down to the bowel.  Patient oriented about the procedure of getting back to the abdominal cavity and connecting the intestine.  Patient oriented about the possible risks and benefit of surgery.  We will proceed with reopening of recent laparotomy and intestinal anastomosis.

## 2019-09-13 NOTE — Consult Note (Signed)
[facility] NOTE   Pharmacy Consult for: Heparin Indication: Suspicion of ischemic bowel.  Allergies  Allergen Reactions  . Flecainide Shortness Of Breath  . Propafenone Shortness Of Breath and Swelling  . Rivaroxaban Other (See Comments)    Patient Measurements: Height: 5\' 4"  (D34-534 cm) Weight: 283 lb 1.1 oz (128.4 kg) IBW/kg (Calculated) : 54.7 Heparin Dosing Weight: 86.4 kg   Vital Signs: Temp: 98.2 F (36.8 C) (09/21 1710) Temp Source: Oral (09/21 1710) BP: 129/82 (09/21 2300) Pulse Rate: 102 (09/21 2300)  Labs: Recent Labs    09/11/19 1041  09/11/19 1711 09/12/19 0153 09/12/19 0449 09/12/19 1258 09/12/19 2258  HGB 12.8  --   --   --  12.8  --   --   HCT 39.3  --   --   --  40.1  --   --   PLT 296  --   --   --  343  --   --   APTT  --    < > 25 26  --  31 39*  LABPROT  --   --  15.4*  --   --   --   --   INR  --   --  1.2  --   --   --   --   HEPARINUNFRC  --   --  2.62*  --  1.65*  --   --   CREATININE 0.93  --   --   --  1.06*  --   --   TROPONINIHS 9  --   --   --   --   --   --    < > = values in this interval not displayed.    Estimated Creatinine Clearance: 66.6 mL/min (A) (by C-G formula based on SCr of 1.06 mg/dL (H)).  Medications:  Eliquis 5 mg BID- last known dose 09/10/19 @ 2000  Assessment: Patient has a PMH significant for atrial fibrillation (on Eliquis), pulmonary HTN, PAD who presents with complaints of abdominal pain, N/V, and diarrhea. Per MD, bolus was deferred. For now, will base levels on aPTT until levels correlate with HL. H/H and platelets are stable.   09/21 1258 aPTT = 31 sec Confirmed with RN that heparin drip has not been stopped.  Goal of Therapy:  Heparin level 0.3-0.7 units/ml aPTT 66-102s seconds Monitor platelets by anticoagulation protocol: Yes   Plan:  09/21 @ 2300 aPTT 39 seconds subtherapeutic. Will increase rate to 1500 units/hr and will recheck aPTT/HL @ 0600 right when drip is stopping. Patient  planning to go to surgery at noon; heparin drip to stop 09/22 @ 0600, CBC stable will continue to monitor.  Tobie Lords, PharmD, BCPS Clinical Pharmacist 09/13/2019

## 2019-09-13 NOTE — Anesthesia Procedure Notes (Signed)
Procedure Name: Intubation Date/Time: 09/13/2019 12:30 PM Performed by: Caryl Asp, CRNA Pre-anesthesia Checklist: Patient identified, Patient being monitored, Timeout performed, Emergency Drugs available and Suction available Patient Re-evaluated:Patient Re-evaluated prior to induction Oxygen Delivery Method: Circle system utilized Preoxygenation: Pre-oxygenation with 100% oxygen Induction Type: IV induction Laryngoscope Size: 3 and McGraph Grade View: Grade I Tube type: Oral Tube size: 7.0 mm Number of attempts: 1 Airway Equipment and Method: Stylet and Video-laryngoscopy Placement Confirmation: ETT inserted through vocal cords under direct vision,  positive ETCO2 and breath sounds checked- equal and bilateral Secured at: 21 cm Tube secured with: Tape Dental Injury: Teeth and Oropharynx as per pre-operative assessment  Difficulty Due To: Difficulty was anticipated

## 2019-09-13 NOTE — Progress Notes (Signed)
Prevena dressing covered in tegaderms, suction to dressing restored.

## 2019-09-14 ENCOUNTER — Encounter: Payer: Self-pay | Admitting: General Surgery

## 2019-09-14 LAB — CBC
HCT: 35.8 % — ABNORMAL LOW (ref 36.0–46.0)
Hemoglobin: 11.3 g/dL — ABNORMAL LOW (ref 12.0–15.0)
MCH: 29.5 pg (ref 26.0–34.0)
MCHC: 31.6 g/dL (ref 30.0–36.0)
MCV: 93.5 fL (ref 80.0–100.0)
Platelets: 273 10*3/uL (ref 150–400)
RBC: 3.83 MIL/uL — ABNORMAL LOW (ref 3.87–5.11)
RDW: 13.9 % (ref 11.5–15.5)
WBC: 9.9 10*3/uL (ref 4.0–10.5)
nRBC: 0 % (ref 0.0–0.2)

## 2019-09-14 LAB — APTT
aPTT: 46 seconds — ABNORMAL HIGH (ref 24–36)
aPTT: 59 seconds — ABNORMAL HIGH (ref 24–36)
aPTT: 69 seconds — ABNORMAL HIGH (ref 24–36)
aPTT: 89 seconds — ABNORMAL HIGH (ref 24–36)

## 2019-09-14 LAB — BASIC METABOLIC PANEL
Anion gap: 10 (ref 5–15)
BUN: 26 mg/dL — ABNORMAL HIGH (ref 8–23)
CO2: 23 mmol/L (ref 22–32)
Calcium: 8.9 mg/dL (ref 8.9–10.3)
Chloride: 104 mmol/L (ref 98–111)
Creatinine, Ser: 0.84 mg/dL (ref 0.44–1.00)
GFR calc Af Amer: >60 mL/min (ref >60)
GFR calc non Af Amer: >60 mL/min (ref >60)
Glucose, Bld: 126 mg/dL — ABNORMAL HIGH (ref 70–99)
Potassium: 4.2 mmol/L (ref 3.5–5.1)
Sodium: 137 mmol/L (ref 135–145)

## 2019-09-14 LAB — PROCALCITONIN: Procalcitonin: 1.17 ng/mL

## 2019-09-14 LAB — SURGICAL PATHOLOGY

## 2019-09-14 MED ORDER — METHYLPREDNISOLONE SODIUM SUCC 40 MG IJ SOLR
40.0000 mg | Freq: Two times a day (BID) | INTRAMUSCULAR | Status: DC
  Administered 2019-09-14 – 2019-09-15 (×3): 40 mg via INTRAVENOUS
  Filled 2019-09-14 (×3): qty 1

## 2019-09-14 MED ORDER — BUDESONIDE 0.25 MG/2ML IN SUSP
0.2500 mg | Freq: Two times a day (BID) | RESPIRATORY_TRACT | Status: DC
  Administered 2019-09-14 – 2019-09-20 (×13): 0.25 mg via RESPIRATORY_TRACT
  Filled 2019-09-14 (×13): qty 2

## 2019-09-14 MED ORDER — IPRATROPIUM-ALBUTEROL 0.5-2.5 (3) MG/3ML IN SOLN
3.0000 mL | Freq: Four times a day (QID) | RESPIRATORY_TRACT | Status: DC
  Administered 2019-09-14 – 2019-09-16 (×6): 3 mL via RESPIRATORY_TRACT
  Filled 2019-09-14 (×9): qty 3

## 2019-09-14 MED ORDER — PHENOL 1.4 % MT LIQD
1.0000 | OROMUCOSAL | Status: DC | PRN
  Administered 2019-09-14: 11:00:00 1 via OROMUCOSAL
  Filled 2019-09-14: qty 177

## 2019-09-14 MED ORDER — MOMETASONE FURO-FORMOTEROL FUM 200-5 MCG/ACT IN AERO
2.0000 | INHALATION_SPRAY | Freq: Two times a day (BID) | RESPIRATORY_TRACT | Status: DC
  Administered 2019-09-14: 2 via RESPIRATORY_TRACT
  Filled 2019-09-14: qty 8.8

## 2019-09-14 NOTE — Progress Notes (Signed)
[place] at [place] NAME: Laura [name]    MR#:  [id number]  DATE OF BIRTH:  1951/03/31  SUBJECTIVE:  CHIEF COMPLAINT:   Chief Complaint  Patient presents with  . Abdominal Pain  . Fall  . Tachycardia   - s/p small bowel and colon anastomosis done yesterday.  Patient complains of significant tiredness that she could not get her CPAP on last night.  Still has the NG tube.  Wound VAC for the abdomen noted. -A. fib with RVR.  On Cardizem and heparin drips -Alert and oriented  REVIEW OF SYSTEMS:  Review of Systems  Constitutional: Positive for malaise/fatigue. Negative for chills and fever.  HENT: Negative for congestion, ear discharge, hearing loss and nosebleeds.   Eyes: Negative for blurred vision and double vision.  Respiratory: Negative for cough, shortness of breath and wheezing.   Cardiovascular: Negative for chest pain and palpitations.  Gastrointestinal: Positive for abdominal pain. Negative for constipation, diarrhea, nausea and vomiting.  Genitourinary: Negative for dysuria.  Musculoskeletal: Positive for myalgias.  Neurological: Positive for weakness. Negative for dizziness, focal weakness, seizures and headaches.  Psychiatric/Behavioral: Negative for depression.    DRUG ALLERGIES:   Allergies  Allergen Reactions  . Flecainide Shortness Of Breath  . Propafenone Shortness Of Breath and Swelling  . Rivaroxaban Other (See Comments)    VITALS:  Blood pressure 132/77, pulse 88, temperature 97.6 F (36.4 C), temperature source Oral, resp. rate 15, height 5\' 4"  (1.626 m), weight 128.4 kg, SpO2 95 %.  PHYSICAL EXAMINATION:  Physical Exam  GENERAL:  68 y.o.-year-old patient lying in the bed, in distress due to abdominal pain.  EYES: Pupils equal, round, reactive to light and accommodation. No scleral icterus. Extraocular muscles intact.  HEENT: Head atraumatic, normocephalic. Oropharynx and nasopharynx clear.  NECK:   Supple, no jugular venous distention. No thyroid enlargement, no tenderness.  LUNGS: Normal breath sounds bilaterally, no wheezing, rales,rhonchi or crepitation. No use of accessory muscles of respiration.  Decreased bibasilar breath sounds CARDIOVASCULAR: S1, S2 rapid rate and irregular rhythm. No  rubs, or gallops.  2/6 systolic murmur is present ABDOMEN: Mid abdominal suture with wound VAC in place, tender in the right lower quadrant.  Hypoactive bowel sounds present. No organomegaly or mass.  EXTREMITIES: No pedal edema, cyanosis, or clubbing.  NEUROLOGIC: Cranial nerves II through XII are intact. Muscle strength 5/5 in all extremities. Sensation intact. Gait not checked.  Global weakness noted PSYCHIATRIC: The patient is alert and oriented x 3.  SKIN: No obvious rash, lesion, or ulcer.    LABORATORY PANEL:   CBC Recent Labs  Lab 09/14/19 0021  WBC 9.9  HGB 11.3*  HCT 35.8*  PLT 273   ------------------------------------------------------------------------------------------------------------------  Chemistries  Recent Labs  Lab 09/12/19 0449  09/14/19 0021  NA 136   < > 137  K 4.3   < > 4.2  CL 107   < > 104  CO2 21*   < > 23  GLUCOSE 207*   < > 126*  BUN 18   < > 26*  CREATININE 1.06*   < > 0.84  CALCIUM 8.2*   < > 8.9  MG 1.9  --   --   AST 21  --   --   ALT 17  --   --   ALKPHOS 55  --   --   BILITOT 1.0  --   --    < > = values in this interval  not displayed.   ------------------------------------------------------------------------------------------------------------------  Cardiac Enzymes No results for input(s): TROPONINI in the last 168 hours. ------------------------------------------------------------------------------------------------------------------  RADIOLOGY:  No results found.  EKG:   Orders placed or performed during the hospital encounter of 09/11/19  . EKG 12-Lead  . EKG 12-Lead  . EKG 12-Lead  . EKG 12-Lead  . EKG 12-Lead  . EKG  12-Lead  . ED EKG  . ED EKG    ASSESSMENT AND PLAN:   68 year old female with past medical history significant for chronic A. fib on sleep apnea, PAD, hypertension presents to hospital secondary to abdominal pain and noted to have ischemic colitis.  1.  Ischemic colitis-appreciate general surgery consult and vascular consult -Patient had surgery on admission with ileum and right colectomy, yesterday she had anastomosis done.  Abdominal wound VAC in place. -Mesenteric angiogram with no major vessel stenosis.   -Has an NG tube.  On IV antibiotics with Zosyn -Continue further management per surgical team -When there is return of bowel function, recommend NG removal and starting clear liquids per surgical team.  2.  Atrial fibrillation with RVR-since n.p.o., patient on Cardizem drip.  Also on heparin drip -Appreciate cardiology consult -Once able to take p.o. medications, recommend transitioning to oral meds.  3.  GERD-Protonix  4.  Sleep apnea-continue CPAP once the NG tube is out -Minimal wheeze today.  Recommending discontinuing IV fluids are decreasing the rate.  Inhalers. -Check chest x-ray  5.  DVT prophylaxis-Heparin drip   Encourage ambulation after surgery-recommend physical therapy    All the records are reviewed and case discussed with Care Management/Social Workerr. Management plans discussed with the patient, family and they are in agreement.  CODE STATUS: Full code  TOTAL TIME TAKING CARE OF THIS PATIENT: 37 minutes.   POSSIBLE D/C IN 3-4 DAYS, DEPENDING ON CLINICAL CONDITION.   Gladstone Lighter M.D on 09/14/2019 at 9:06 AM  Between 7am to 6pm - Pager - [phone]  After 6pm go to [email or web address] - password [email or web address] [facility]  Office  [phone]  CC: Primary care physician; [name], MD

## 2019-09-14 NOTE — Progress Notes (Signed)
Pharmacy Antibiotic Note  Laura [name] is a 68 y.o. female admitted on 09/11/2019 with colitis. History significant for chronic atrial fibrillation, hypertension, pulmonary hypertension, COPD, peripheral artery disease. CT scan of abdomen and pelvis revealed focal small bowel enteritis. Leukocytosis and elevated lactate and PCT of 4.01 on admit. Pharmacy has been consulted for Zosyn dosing.  Patient is POD #2 for small bowel and right colectomy and POD #1 for anastomosis. Today WBC and PCT are down-trending. Patient is afebrile.  Plan: Today is Day 3 of antibiotic therapy.   Continue Zosyn 3.375g IV q8h (4 hour infusion).   Follow-up antibiotic plan.   Height: 5\' 4"  (162.6 cm) Weight: 283 lb 1.1 oz (128.4 kg) IBW/kg (Calculated) : 54.7  Temp (24hrs), Avg:98.1 F (36.7 C), Min:97.6 F (36.4 C), Max:98.6 F (37 C)  Recent Labs  Lab 09/11/19 1041 09/11/19 1318 09/11/19 1711 09/12/19 0153 09/12/19 0449 09/13/19 0612 09/14/19 0021  WBC 11.8*  --   --   --  14.3* 12.5* 9.9  CREATININE 0.93  --   --   --  1.06* 0.93 0.84  LATICACIDVEN  --  1.1 1.6 2.5*  --   --   --     Estimated Creatinine Clearance: 84 mL/min (by C-G formula based on SCr of 0.84 mg/dL).    Allergies  Allergen Reactions  . Flecainide Shortness Of Breath  . Propafenone Shortness Of Breath and Swelling  . Rivaroxaban Other (See Comments)    Antimicrobials this admission: Zosyn 09/20 >>   Dose adjustments this admission: n/a  Microbiology results: 09/20 Ova + Parasite Exam: pending 09/21 MRSA PCR: negative  Thank you for allowing pharmacy to be a part of this patient's care.  [place] Resident 09/14/2019 11:43 AM

## 2019-09-14 NOTE — Progress Notes (Signed)
[facility] Day(s): 3.   Post op day(s): 1 Day Post-Op.   Interval History: Patient seen and examined, no acute events or new complaints overnight. Patient reports feeling well today. Denies nausea and vomiting. Denies significant pain. Denies fever or chills.    Vital signs in last 24 hours: [min-max] current  Temp:  [97.6 F (36.4 C)-98.3 F (36.8 C)] 98.3 F (36.8 C) (09/23 2000) Pulse Rate:  [41-136] 105 (09/23 2103) Resp:  [13-35] 24 (09/23 0900) BP: (106-159)/(56-135) 129/84 (09/23 2103) SpO2:  [93 %-100 %] 96 % (09/23 2103)     Height: 5\' 4"  (162.6 cm) Weight: 128.4 kg BMI (Calculated): 48.57   Physical Exam:  Constitutional: alert, cooperative and no distress  Respiratory: breathing non-labored at rest  Cardiovascular: irregular rate  Gastrointestinal: soft, non-tender, and non-distended. Wound dry and clean.   Labs:  CBC Latest Ref Rng & Units 09/14/2019 09/13/2019 09/12/2019  WBC 4.0 - 10.5 K/uL 9.9 12.5(H) 14.3(H)  Hemoglobin 12.0 - 15.0 g/dL 11.3(L) 11.6(L) 12.8  Hematocrit 36.0 - 46.0 % 35.8(L) 36.9 40.1  Platelets 150 - 400 K/uL 273 273 343   CMP Latest Ref Rng & Units 09/14/2019 09/13/2019 09/12/2019  Glucose 70 - 99 mg/dL 126(H) 118(H) 207(H)  BUN 8 - 23 mg/dL 26(H) 26(H) 18  Creatinine 0.44 - 1.00 mg/dL 0.84 0.93 1.06(H)  Sodium 135 - 145 mmol/L 137 137 136  Potassium 3.5 - 5.1 mmol/L 4.2 4.6 4.3  Chloride 98 - 111 mmol/L 104 107 107  CO2 22 - 32 mmol/L 23 22 21(L)  Calcium 8.9 - 10.3 mg/dL 8.9 8.8(L) 8.2(L)  Total Protein 6.5 - 8.1 g/dL - - 6.1(L)  Total Bilirubin 0.3 - 1.2 mg/dL - - 1.0  Alkaline Phos 38 - 126 U/L - - 55  AST 15 - 41 U/L - - 21  ALT 0 - 44 U/L - - 17    Imaging studies: No new pertinent imaging studies   Assessment/Plan:  68 y.o. female with terminal ileum ischemia 1 Day Post-Op s/p ileum and right colon resection, with second look and ileotransversostomy, complicated by pertinent comorbidities including  atrial fibrillation on anticoagulation, COPD, pulmonary hypertensin, sleep apnea and morbid obesity.  Patient tolerated procedure well. Recovering slowly. Today with bowel sounds. Will remove NGT and start clear liquid diet trial. Appreciate ICU physician and Hospitalist for management of medical condition. Wound dry and clean. Negative pressure dressing removed due to malfunction. No sign of infection. Agree with therapeutic anticoagulation and antibiotic therapy. Encourage patient to get out of bed to chair and ambulate.   Arnold Long, MD

## 2019-09-14 NOTE — Progress Notes (Signed)
ANTICOAGULATION CONSULT NOTE - Initial Consult  Pharmacy Consult for Heparin  Indication: atrial fibrillation  Allergies  Allergen Reactions  . Flecainide Shortness Of Breath  . Propafenone Shortness Of Breath and Swelling  . Rivaroxaban Other (See Comments)    Patient Measurements: Height: 5\' 4"  (162.6 cm) Weight: 283 lb 1.1 oz (128.4 kg) IBW/kg (Calculated) : 54.7 Heparin Dosing Weight: 86.4 kg   Vital Signs: Temp: 98.3 F (36.8 C) (09/23 2000) Temp Source: Oral (09/23 2000) BP: 129/84 (09/23 2103) Pulse Rate: 105 (09/23 2103)  Labs: Recent Labs    09/12/19 0449  09/13/19 0612 09/14/19 0021 09/14/19 0917 09/14/19 1625  HGB 12.8  --  11.6* 11.3*  --   --   HCT 40.1  --  36.9 35.8*  --   --   PLT 343  --  273 273  --   --   APTT  --    < > 39* 46* 59* 69*  HEPARINUNFRC 1.65*  --  0.96*  --   --   --   CREATININE 1.06*  --  0.93 0.84  --   --    < > = values in this interval not displayed.    Estimated Creatinine Clearance: 84 mL/min (by C-G formula based on SCr of 0.84 mg/dL).   Medical History: Past Medical History:  Diagnosis Date  . Asthma   . Chronic atrial fibrillation    a. diagnosed in 09/2016; b. failed flecainide and propafenone due to LE swelling and SOB, could not afford Multaq; c. CHADS2VASc => 3 (HTN, age x 1, female); d. on Eliquis  . GERD (gastroesophageal reflux disease)   . History of stress test    a. Lexiscan Myoview 10/2016: no evidence of ischemia, EF 53%  . Hypertension   . Obesity   . Obstructive sleep apnea   . Pulmonary hypertension ([place])   . Systolic dysfunction    a. TTE 10/2016: EF 50%, mild LVH, moderately dilated LA, moderate MR/TR, mild pulmonary hypertension    Medications:  Medications Prior to Admission  Medication Sig Dispense Refill Last Dose  . albuterol (VENTOLIN HFA) 108 (90 Base) MCG/ACT inhaler Inhale 2 puffs into the lungs every 4 (four) hours as needed for wheezing or shortness of breath. 18 g 3 Unknown at PRN   . ALPRAZolam (XANAX) 0.25 MG tablet Take 1 tablet (0.25 mg total) by mouth 2 (two) times daily. 45 tablet 3 09/10/2019 at 2000  . apixaban (ELIQUIS) 5 MG TABS tablet Take 1 tablet (5 mg total) by mouth 2 (two) times daily. 60 tablet 5 09/10/2019 at 2000  . dexlansoprazole (DEXILANT) 60 MG capsule Take 1 capsule (60 mg total) by mouth daily. 30 capsule 2 09/10/2019 at 0800  . diltiazem (CARDIZEM CD) 360 MG 24 hr capsule TAKE 1 CAPSULE (360 MG TOTAL) BY MOUTH DAILY. 90 capsule 2 09/10/2019 at Unknown time  . EPINEPHrine 0.3 mg/0.3 mL IJ SOAJ injection Inject 0.3 mg into the muscle as needed for anaphylaxis.    Unknown at PRN  . furosemide (LASIX) 40 MG tablet Take 1 tablet (40 mg total) by mouth daily. 90 tablet 1 09/10/2019 at 0800  . guaiFENesin (MUCINEX) 600 MG 12 hr tablet Take 600 mg by mouth 2 (two) times daily as needed for cough or to loosen phlegm.    Unknown at PRN  . ibuprofen (ADVIL,MOTRIN) 200 MG tablet Take 400-800 mg by mouth every 6 (six) hours as needed for fever or mild pain.    Unknown at  PRN  . ipratropium-albuterol (DUONEB) 0.5-2.5 (3) MG/3ML SOLN Take 3 mLs by nebulization every 4 (four) hours as needed. (Patient taking differently: Take 3 mLs by nebulization every 4 (four) hours as needed (shortness of breath / wheezing). ) 120 mL 3 Unknown at PRN  . levalbuterol (XOPENEX) 0.31 MG/3ML nebulizer solution Take 3 mLs by nebulization every 4 (four) hours as needed. For wheezing/ and or shortness of breath.   Unknown at PRN  . losartan (COZAAR) 25 MG tablet Take 1 tablet (25 mg total) by mouth daily. 90 tablet 2 09/10/2019 at 0800  . montelukast (SINGULAIR) 10 MG tablet TAKE 1 TABLET BY MOUTH EVERY DAY (Patient taking differently: Take 10 mg by mouth at bedtime. ) 90 tablet 3 09/10/2019 at 2000  . Multiple Vitamin (MULTIVITAMIN) tablet Take 1 tablet by mouth daily.   09/10/2019 at Unknown time  . nystatin (MYCOSTATIN) 100000 UNIT/ML suspension Take 5 mLs (500,000 Units total) by mouth 4 (four)  times daily. (Patient taking differently: Take 5 mLs by mouth 4 (four) times daily as needed. ) 200 mL 1 Unknown at PRN  . ondansetron (ZOFRAN-ODT) 8 MG disintegrating tablet Take 1 tablet (8 mg total) by mouth every 8 (eight) hours as needed for nausea or vomiting. 30 tablet 0 Unknown at PRN  . predniSONE (DELTASONE) 10 MG tablet Take 1 tablet (10 mg total) by mouth daily with breakfast. 30 tablet 5 09/10/2019 at 0800  . Probiotic Product (PROBIOTIC-10) CAPS Take 1 capsule by mouth daily.    09/10/2019 at Unknown time  . spironolactone (ALDACTONE) 25 MG tablet Take 1 tablet (25 mg total) by mouth 2 (two) times daily. 180 tablet 0 09/10/2019 at 2000  . theophylline (THEO-24) 300 MG 24 hr capsule Take 1 capsule (300 mg total) by mouth daily. 30 capsule 3 09/10/2019 at 0800  . pantoprazole (PROTONIX) 40 MG tablet Take 1 tablet (40 mg total) by mouth daily. (Patient not taking: Reported on 09/11/2019) 30 tablet 5 Not Taking at Unknown time  . phentermine (ADIPEX-P) 37.5 MG tablet Take 1 tablet (37.5 mg total) by mouth daily before breakfast. (Patient not taking: Reported on 09/11/2019) 30 tablet 1 Not Taking at Unknown time    Assessment: Eliquis 5 mg BID- last known dose 09/10/19 @ 2000  Assessment: Patient has a PMH significant for atrial fibrillation (on Eliquis), pulmonary HTN, PAD who presents with complaints of abdominal pain, N/V, and diarrhea. Patient found to have ischemic colitis now s/p small bowel resection and right colectomy which was left in discontinuity on 9/21. Return to OR on 9/22 for anastomosis. Patient on heparin drip this admission which has been held around surgeries.    Goal of Therapy:  Heparin level 0.3-0.7 units/ml aPTT 66-102s seconds Monitor platelets by anticoagulation protocol: Yes   Plan:  09/23 @ 0917 aPTT 59 seconds, subtherapeutic. Will increase rate to 1800 units/hr and will recheck aPTT in 6 hours.   Hgb & plt stable; will continue to monitor daily CBC while  on heparin drip per protocol.   Continue to follow-up re-start of apixaban.   9/23:  APTT @ 1625 = 69 Will continue this pt on current rate and recheck aPTT on 9/23 @ 2230.  Will check HL on 9/24 with AM labs.  Dawana Asper D 09/14/2019,10:00 PM

## 2019-09-14 NOTE — Consult Note (Signed)
[facility] NOTE   Pharmacy Consult for: Heparin Indication: Atrial fibrillation  Allergies  Allergen Reactions  . Flecainide Shortness Of Breath  . Propafenone Shortness Of Breath and Swelling  . Rivaroxaban Other (See Comments)    Patient Measurements: Height: 5\' 4"  (162.6 cm) Weight: 283 lb 1.1 oz (128.4 kg) IBW/kg (Calculated) : 54.7 Heparin Dosing Weight: 86.4 kg   Vital Signs: Temp: 97.6 F (36.4 C) (09/23 0800) Temp Source: Oral (09/23 0800) BP: 115/98 (09/23 0900) Pulse Rate: 72 (09/23 0900)  Labs: Recent Labs    09/11/19 1711  09/12/19 0449  09/13/19 0612 09/14/19 0021 09/14/19 0917  HGB  --    < > 12.8  --  11.6* 11.3*  --   HCT  --   --  40.1  --  36.9 35.8*  --   PLT  --   --  343  --  273 273  --   APTT 25   < >  --    < > 39* 46* 59*  LABPROT 15.4*  --   --   --   --   --   --   INR 1.2  --   --   --   --   --   --   HEPARINUNFRC 2.62*  --  1.65*  --  0.96*  --   --   CREATININE  --   --  1.06*  --  0.93 0.84  --    < > = values in this interval not displayed.    Estimated Creatinine Clearance: 84 mL/min (by C-G formula based on SCr of 0.84 mg/dL).  Medications:  Eliquis 5 mg BID- last known dose 09/10/19 @ 2000  Assessment: Patient has a PMH significant for atrial fibrillation (on Eliquis), pulmonary HTN, PAD who presents with complaints of abdominal pain, N/V, and diarrhea. Patient found to have ischemic colitis now s/p small bowel resection and right colectomy which was left in discontinuity on 9/21. Return to OR on 9/22 for anastomosis. Patient on heparin drip this admission which has been held around surgeries.    Goal of Therapy:  Heparin level 0.3-0.7 units/ml aPTT 66-102s seconds Monitor platelets by anticoagulation protocol: Yes   Plan:  09/23 @ 0917 aPTT 59 seconds, subtherapeutic. Will increase rate to 1800 units/hr and will recheck aPTT in 6 hours.   Hgb & plt stable; will continue to monitor daily CBC while on heparin  drip per protocol.   Continue to follow-up re-start of apixaban.   [place] Resident 09/14/2019

## 2019-09-14 NOTE — Progress Notes (Addendum)
Progress Note  Patient Name: Laura [name] Date of Encounter: 09/14/2019  Primary Cardiologist: Kathlyn Sacramento, MD   Subjective   Patient seen this morning with daughter at bedside.  She states feeling fine denies chest pain palpitations or shortness of breath.  Her abdominal pain is also improved.  Just a little bit uncomfortable because of the NG tube.  Inpatient Medications    Scheduled Meds: . budesonide (PULMICORT) nebulizer solution  0.25 mg Nebulization BID  . Chlorhexidine Gluconate Cloth  6 each Topical Daily  . ipratropium-albuterol  3 mL Nebulization Q6H  . methylPREDNISolone (SOLU-MEDROL) injection  40 mg Intravenous Q12H  . metoprolol tartrate  5 mg Intravenous Q8H  . pantoprazole (PROTONIX) IV  40 mg Intravenous Q12H  . pneumococcal 23 valent vaccine  0.5 mL Intramuscular Tomorrow-1000   Continuous Infusions: . sodium chloride 50 mL/hr at 09/14/19 1126  . diltiazem (CARDIZEM) infusion 5 mg/hr (09/14/19 0600)  . heparin 1,800 Units/hr (09/14/19 1148)  . piperacillin-tazobactam (ZOSYN)  IV 3.375 g (09/14/19 1124)   PRN Meds: fentaNYL (SUBLIMAZE) injection, HYDROmorphone (DILAUDID) injection, HYDROmorphone (DILAUDID) injection, ipratropium-albuterol, ondansetron (ZOFRAN) IV, phenol   Vital Signs    Vitals:   09/14/19 0600 09/14/19 0800 09/14/19 0900 09/14/19 1151  BP: 117/85 132/77 (!) 115/98 (!) 121/92  Pulse: (!) 103 88 72   Resp: (!) 35 15 (!) 24   Temp:  97.6 F (36.4 C)    TempSrc:  Oral    SpO2: 97% 95% 94%   Weight:      Height:        Intake/Output Summary (Last 24 hours) at 09/14/2019 1154 Last data filed at 09/14/2019 1148 Gross per 24 hour  Intake 2712.3 ml  Output 1285 ml  Net 1427.3 ml   Last 3 Weights 09/12/2019 09/12/2019 09/11/2019  Weight (lbs) 283 lb 1.1 oz 283 lb 1.1 oz 285 lb 12.8 oz  Weight (kg) 128.4 kg 128.4 kg 129.638 kg      Telemetry    Atrial fibrillation, heart rate 90s- Personally Reviewed  ECG    Not performed  today.  Physical Exam   GEN: No acute distress.   Neck: No JVD Cardiac:  Irregular irregular, no murmurs, rubs, or gallops.  Respiratory: Clear to auscultation bilaterally. GI: Soft, mild discomfort with palpation, non-distended, obese MS: No edema; No deformity. Neuro:  Nonfocal  Psych: Normal affect   Labs    High Sensitivity Troponin:   Recent Labs  Lab 09/11/19 1041  TROPONINIHS 9      Chemistry Recent Labs  Lab 09/11/19 1041 09/12/19 0449 09/13/19 0612 09/14/19 0021  NA 135 136 137 137  K 3.9 4.3 4.6 4.2  CL 101 107 107 104  CO2 26 21* 22 23  GLUCOSE 108* 207* 118* 126*  BUN 16 18 26* 26*  CREATININE 0.93 1.06* 0.93 0.84  CALCIUM 8.3* 8.2* 8.8* 8.9  PROT 6.0* 6.1*  --   --   ALBUMIN 3.3* 3.5  --   --   AST 24 21  --   --   ALT 18 17  --   --   ALKPHOS 56 55  --   --   BILITOT 1.0 1.0  --   --   GFRNONAA >60 54* >60 >60  GFRAA >60 >60 >60 >60  ANIONGAP 8 8 8 10      Hematology Recent Labs  Lab 09/12/19 0449 09/13/19 0612 09/14/19 0021  WBC 14.3* 12.5* 9.9  RBC 4.26 3.94 3.83*  HGB 12.8  11.6* 11.3*  HCT 40.1 36.9 35.8*  MCV 94.1 93.7 93.5  MCH 30.0 29.4 29.5  MCHC 31.9 31.4 31.6  RDW 14.1 14.0 13.9  PLT 343 273 273    BNPNo results for input(s): BNP, PROBNP in the last 168 hours.   DDimer No results for input(s): DDIMER in the last 168 hours.   Radiology    No results found.  Cardiac Studies   Not performed today  Patient Profile     68 y.o. female history of chronic atrial fibrillation, obesity, COPD, OSA who presented with abdominal pain found to have ischemic bowel.  She is status post surgical intervention.  She was noted to have A. fib with tachycardia.  Assessment & Plan    Patient is stable otherwise, abdominal pain improved from yesterday.  Currently she is on diltiazem drip adequately controlled heart rates.  1.  A. fib RVR -Continue to diltiazem drip.  Try to titrate off as heart rate permit while using increased doses  of beta-blocker. Continue Lopressor IV 5 mg every 8 hours.  Increase as blood pressure tolerates for heart rate control -Continue IV heparin drip -Transition to oral medications when patient able to tolerate and okay with the ICU and surgery team.  2.  Hypertension -Blood pressures improved today  3.  Ischemic bowel -Management as per surgical team.  For questions or updates, please contact [place] Please consult [email or web address] for contact info under        Signed, Kate Sable, MD  09/14/2019, 11:54 AM

## 2019-09-14 NOTE — Progress Notes (Signed)
ANTICOAGULATION CONSULT NOTE - Initial Consult  Pharmacy Consult for Heparin  Indication: atrial fibrillation  Allergies  Allergen Reactions  . Flecainide Shortness Of Breath  . Propafenone Shortness Of Breath and Swelling  . Rivaroxaban Other (See Comments)    Patient Measurements: Height: 5\' 4"  (162.6 cm) Weight: 283 lb 1.1 oz (128.4 kg) IBW/kg (Calculated) : 54.7 Heparin Dosing Weight: 86.4 kg   Vital Signs: Temp: 98.3 F (36.8 C) (09/23 2000) Temp Source: Oral (09/23 2000) BP: 129/84 (09/23 2103) Pulse Rate: 105 (09/23 2103)  Labs: Recent Labs    09/12/19 0449  09/13/19 0612 09/14/19 0021 09/14/19 0917 09/14/19 1625 09/14/19 2203  HGB 12.8  --  11.6* 11.3*  --   --   --   HCT 40.1  --  36.9 35.8*  --   --   --   PLT 343  --  273 273  --   --   --   APTT  --    < > 39* 46* 59* 69* 89*  HEPARINUNFRC 1.65*  --  0.96*  --   --   --   --   CREATININE 1.06*  --  0.93 0.84  --   --   --    < > = values in this interval not displayed.    Estimated Creatinine Clearance: 84 mL/min (by C-G formula based on SCr of 0.84 mg/dL).   Medical History: Past Medical History:  Diagnosis Date  . Asthma   . Chronic atrial fibrillation    a. diagnosed in 09/2016; b. failed flecainide and propafenone due to LE swelling and SOB, could not afford Multaq; c. CHADS2VASc => 3 (HTN, age x 1, female); d. on Eliquis  . GERD (gastroesophageal reflux disease)   . History of stress test    a. Lexiscan Myoview 10/2016: no evidence of ischemia, EF 53%  . Hypertension   . Obesity   . Obstructive sleep apnea   . Pulmonary hypertension ([place])   . Systolic dysfunction    a. TTE 10/2016: EF 50%, mild LVH, moderately dilated LA, moderate MR/TR, mild pulmonary hypertension    Medications:  Medications Prior to Admission  Medication Sig Dispense Refill Last Dose  . albuterol (VENTOLIN HFA) 108 (90 Base) MCG/ACT inhaler Inhale 2 puffs into the lungs every 4 (four) hours as needed for wheezing  or shortness of breath. 18 g 3 Unknown at PRN  . ALPRAZolam (XANAX) 0.25 MG tablet Take 1 tablet (0.25 mg total) by mouth 2 (two) times daily. 45 tablet 3 09/10/2019 at 2000  . apixaban (ELIQUIS) 5 MG TABS tablet Take 1 tablet (5 mg total) by mouth 2 (two) times daily. 60 tablet 5 09/10/2019 at 2000  . dexlansoprazole (DEXILANT) 60 MG capsule Take 1 capsule (60 mg total) by mouth daily. 30 capsule 2 09/10/2019 at 0800  . diltiazem (CARDIZEM CD) 360 MG 24 hr capsule TAKE 1 CAPSULE (360 MG TOTAL) BY MOUTH DAILY. 90 capsule 2 09/10/2019 at Unknown time  . EPINEPHrine 0.3 mg/0.3 mL IJ SOAJ injection Inject 0.3 mg into the muscle as needed for anaphylaxis.    Unknown at PRN  . furosemide (LASIX) 40 MG tablet Take 1 tablet (40 mg total) by mouth daily. 90 tablet 1 09/10/2019 at 0800  . guaiFENesin (MUCINEX) 600 MG 12 hr tablet Take 600 mg by mouth 2 (two) times daily as needed for cough or to loosen phlegm.    Unknown at PRN  . ibuprofen (ADVIL,MOTRIN) 200 MG tablet Take 400-800 mg  by mouth every 6 (six) hours as needed for fever or mild pain.    Unknown at PRN  . ipratropium-albuterol (DUONEB) 0.5-2.5 (3) MG/3ML SOLN Take 3 mLs by nebulization every 4 (four) hours as needed. (Patient taking differently: Take 3 mLs by nebulization every 4 (four) hours as needed (shortness of breath / wheezing). ) 120 mL 3 Unknown at PRN  . levalbuterol (XOPENEX) 0.31 MG/3ML nebulizer solution Take 3 mLs by nebulization every 4 (four) hours as needed. For wheezing/ and or shortness of breath.   Unknown at PRN  . losartan (COZAAR) 25 MG tablet Take 1 tablet (25 mg total) by mouth daily. 90 tablet 2 09/10/2019 at 0800  . montelukast (SINGULAIR) 10 MG tablet TAKE 1 TABLET BY MOUTH EVERY DAY (Patient taking differently: Take 10 mg by mouth at bedtime. ) 90 tablet 3 09/10/2019 at 2000  . Multiple Vitamin (MULTIVITAMIN) tablet Take 1 tablet by mouth daily.   09/10/2019 at Unknown time  . nystatin (MYCOSTATIN) 100000 UNIT/ML suspension Take  5 mLs (500,000 Units total) by mouth 4 (four) times daily. (Patient taking differently: Take 5 mLs by mouth 4 (four) times daily as needed. ) 200 mL 1 Unknown at PRN  . ondansetron (ZOFRAN-ODT) 8 MG disintegrating tablet Take 1 tablet (8 mg total) by mouth every 8 (eight) hours as needed for nausea or vomiting. 30 tablet 0 Unknown at PRN  . predniSONE (DELTASONE) 10 MG tablet Take 1 tablet (10 mg total) by mouth daily with breakfast. 30 tablet 5 09/10/2019 at 0800  . Probiotic Product (PROBIOTIC-10) CAPS Take 1 capsule by mouth daily.    09/10/2019 at Unknown time  . spironolactone (ALDACTONE) 25 MG tablet Take 1 tablet (25 mg total) by mouth 2 (two) times daily. 180 tablet 0 09/10/2019 at 2000  . theophylline (THEO-24) 300 MG 24 hr capsule Take 1 capsule (300 mg total) by mouth daily. 30 capsule 3 09/10/2019 at 0800  . pantoprazole (PROTONIX) 40 MG tablet Take 1 tablet (40 mg total) by mouth daily. (Patient not taking: Reported on 09/11/2019) 30 tablet 5 Not Taking at Unknown time  . phentermine (ADIPEX-P) 37.5 MG tablet Take 1 tablet (37.5 mg total) by mouth daily before breakfast. (Patient not taking: Reported on 09/11/2019) 30 tablet 1 Not Taking at Unknown time    Assessment: Eliquis 5 mg BID- last known dose 09/10/19 @ 2000  Assessment: Patient has a PMH significant for atrial fibrillation (on Eliquis), pulmonary HTN, PAD who presents with complaints of abdominal pain, N/V, and diarrhea. Patient found to have ischemic colitis now s/p small bowel resection and right colectomy which was left in discontinuity on 9/21. Return to OR on 9/22 for anastomosis. Patient on heparin drip this admission which has been held around surgeries.    Goal of Therapy:  Heparin level 0.3-0.7 units/ml aPTT 66-102s seconds Monitor platelets by anticoagulation protocol: Yes   Plan:  09/23 @ 2200 aPTT 89 seconds therapeutic. Will continue current rate and will recheck w/ am labs.  Tobie Lords, PharmD,  BCPS Clinical Pharmacist 09/14/2019,11:49 PM

## 2019-09-14 NOTE — Progress Notes (Signed)
CRITICAL CARE PROGRESS NOTE    Name: Laura [name] MRN: [id number] DOB: 1951/06/29     LOS: 3   SUBJECTIVE FINDINGS & SIGNIFICANT EVENTS   Patient description:  68 year old female admitted with nausea, vomiting, atrial fibrillation and abdominal pain; CT abdomen suggested possible ischemia.  She was taken for an emergent laparoscopy with small and large bowel resection and wound VAC placement.  She was transferred to the ICU for close monitoring of her respiratory status   Protocols / Consultants: Surgery  Cardiology   Tests / Events: S/p surgery   Overnight: Clinically improved post op   PAST MEDICAL HISTORY   Past Medical History:  Diagnosis Date  . Asthma   . Chronic atrial fibrillation    a. diagnosed in 09/2016; b. failed flecainide and propafenone due to LE swelling and SOB, could not afford Multaq; c. CHADS2VASc => 3 (HTN, age x 1, female); d. on Eliquis  . GERD (gastroesophageal reflux disease)   . History of stress test    a. Lexiscan Myoview 10/2016: no evidence of ischemia, EF 53%  . Hypertension   . Obesity   . Obstructive sleep apnea   . Pulmonary hypertension ([place])   . Systolic dysfunction    a. TTE 10/2016: EF 50%, mild LVH, moderately dilated LA, moderate MR/TR, mild pulmonary hypertension     SURGICAL HISTORY   Past Surgical History:  Procedure Laterality Date  . BOWEL RESECTION  09/11/2019   Procedure: SMALL BOWEL RESECTION;  Surgeon: Herbert Pun, MD;  Location: [facility];  Service: General;;  . cataract surgery    . INCISION AND DRAINAGE ABSCESS Right 06/29/2016   Procedure: INCISION AND DRAINAGE ABSCESS;  Surgeon: Florene Glen, MD;  Location: [facility];  Service: General;  Laterality: Right;  . INCISION AND DRAINAGE OF WOUND Left 06/29/2016   Procedure:  IRRIGATION AND DEBRIDEMENT WOUND;  Surgeon: Florene Glen, MD;  Location: [facility];  Service: General;  Laterality: Left;  . LAPAROSCOPIC RIGHT COLECTOMY  09/11/2019   Procedure: RIGHT COLECTOMY;  Surgeon: Herbert Pun, MD;  Location: [facility];  Service: General;;  . LAPAROSCOPY N/A 09/11/2019   Procedure: LAPAROSCOPY DIAGNOSTIC;  Surgeon: Herbert Pun, MD;  Location: [facility];  Service: General;  Laterality: N/A;  . LAPAROTOMY N/A 09/13/2019   Procedure: REOPENING OF RECENT LAPAROTOMYANASTOMOSIS OF BOWEL;  Surgeon: Herbert Pun, MD;  Location: [facility];  Service: General;  Laterality: N/A;  . VISCERAL ANGIOGRAPHY N/A 09/12/2019   Procedure: VISCERAL ANGIOGRAPHY;  Surgeon: Algernon Huxley, MD;  Location: [place] [facility];  Service: Cardiovascular;  Laterality: N/A;     FAMILY HISTORY   Family History  Problem Relation Age of Onset  . Dementia Mother   . Osteoporosis Mother   . Vascular Disease Mother   . COPD Father      SOCIAL HISTORY   Social History   Tobacco Use  . Smoking status: Never Smoker  . Smokeless tobacco: Never Used  Substance Use Topics  . Alcohol use: No  . Drug use: No     MEDICATIONS   Current Medication:  Current Facility-Administered Medications:  .  0.9 %  sodium chloride infusion, , Intravenous, Continuous, Kalisetti, Radhika, MD, Last Rate: 50 mL/hr at 09/14/19 1126 .  budesonide (PULMICORT) nebulizer solution 0.25 mg, 0.25 mg, Nebulization, BID, Lanney Gins, Taite Baldassari, MD, 0.25 mg at 09/14/19 1317 .  Chlorhexidine Gluconate Cloth 2 % PADS 6 each, 6 each, Topical, Daily, Herbert Pun, MD, 6 each at 09/14/19 1143 .  diltiazem (CARDIZEM) 100 mg in dextrose 5 % 100 mL (1 mg/mL) infusion, 5-15 mg/hr, Intravenous, Continuous, Cintron-Diaz, Edgardo, MD, Last Rate: 5 mL/hr at 09/14/19 0600, 5 mg/hr at 09/14/19 0600 .  fentaNYL (SUBLIMAZE) injection 50 mcg, 50 mcg, Intravenous, Q2H PRN, Herbert Pun, MD, 50 mcg at  09/14/19 1315 .  heparin ADULT infusion 100 units/mL (25000 units/256mL sodium chloride 0.45%), 1,800 Units/hr, Intravenous, Continuous, Charlett Nose, RPH, Last Rate: 18 mL/hr at 09/14/19 1148, 1,800 Units/hr at 09/14/19 1148 .  HYDROmorphone (DILAUDID) injection 1 mg, 1 mg, Intravenous, Q4H PRN, Herbert Pun, MD, 1 mg at 09/14/19 0848 .  HYDROmorphone (DILAUDID) injection 1 mg, 1 mg, Intravenous, Once PRN, Herbert Pun, MD .  ipratropium-albuterol (DUONEB) 0.5-2.5 (3) MG/3ML nebulizer solution 3 mL, 3 mL, Nebulization, Q4H PRN, Herbert Pun, MD, 3 mL at 09/11/19 2003 .  ipratropium-albuterol (DUONEB) 0.5-2.5 (3) MG/3ML nebulizer solution 3 mL, 3 mL, Nebulization, Q6H, Alyssandra Hulsebus, MD, 3 mL at 09/14/19 1317 .  methylPREDNISolone sodium succinate (SOLU-MEDROL) 40 mg/mL injection 40 mg, 40 mg, Intravenous, Q12H, Lanney Gins, Reegan Mctighe, MD, 40 mg at 09/14/19 1118 .  metoprolol tartrate (LOPRESSOR) injection 5 mg, 5 mg, Intravenous, Q8H, Cintron-Diaz, Edgardo, MD, 5 mg at 09/14/19 1429 .  ondansetron (ZOFRAN) injection 4 mg, 4 mg, Intravenous, Q6H PRN, Herbert Pun, MD, 4 mg at 09/13/19 1501 .  pantoprazole (PROTONIX) injection 40 mg, 40 mg, Intravenous, Q12H, Herbert Pun, MD, 40 mg at 09/14/19 1119 .  phenol (CHLORASEPTIC) mouth spray 1 spray, 1 spray, Mouth/Throat, PRN, Ottie Glazier, MD, 1 spray at 09/14/19 1120 .  piperacillin-tazobactam (ZOSYN) IVPB 3.375 g, 3.375 g, Intravenous, Q8H, Cintron-Diaz, Edgardo, MD, Last Rate: 12.5 mL/hr at 09/14/19 1124, 3.375 g at 09/14/19 1124 .  pneumococcal 23 valent vaccine (PNU-IMMUNE) injection 0.5 mL, 0.5 mL, Intramuscular, Tomorrow-1000, Darel Hong D, NP    ALLERGIES   Flecainide, Propafenone, and Rivaroxaban    REVIEW OF SYSTEMS     10  Point ROS neg except for abd pain  PHYSICAL EXAMINATION   Vital Signs: Temp:  [97.6 F (36.4 C)-98.6 F (37 C)] 97.6 F (36.4 C) (09/23 0800) Pulse  Rate:  [41-106] 72 (09/23 0900) Resp:  [13-35] 24 (09/23 0900) BP: (102-159)/(74-135) 121/92 (09/23 1151) SpO2:  [93 %-100 %] 94 % (09/23 0900)  GENERAL:mild pain  HEAD: Normocephalic, atraumatic.  EYES: Pupils equal, round, reactive to light.  No scleral icterus.  MOUTH: Moist mucosal membrane. NECK: Supple. No thyromegaly. No nodules. No JVD.  PULMONARY: ctab CARDIOVASCULAR: S1 and S2. Regular rate and rhythm. No murmurs, rubs, or gallops.  GASTROINTESTINAL: Soft, nontender, non-distended. No masses. Positive bowel sounds. No hepatosplenomegaly.  MUSCULOSKELETAL: No swelling, clubbing, or edema.  NEUROLOGIC: Mild distress due to acute illness SKIN:intact,warm,dry   PERTINENT DATA     Infusions: . sodium chloride 50 mL/hr at 09/14/19 1126  . diltiazem (CARDIZEM) infusion 5 mg/hr (09/14/19 0600)  . heparin 1,800 Units/hr (09/14/19 1148)  . piperacillin-tazobactam (ZOSYN)  IV 3.375 g (09/14/19 1124)   Scheduled Medications: . budesonide (PULMICORT) nebulizer solution  0.25 mg Nebulization BID  . Chlorhexidine Gluconate Cloth  6 each Topical Daily  . ipratropium-albuterol  3 mL Nebulization Q6H  . methylPREDNISolone (SOLU-MEDROL) injection  40 mg Intravenous Q12H  . metoprolol tartrate  5 mg Intravenous Q8H  . pantoprazole (PROTONIX) IV  40 mg Intravenous Q12H  . pneumococcal 23 valent vaccine  0.5 mL Intramuscular Tomorrow-1000   PRN Medications: fentaNYL (SUBLIMAZE) injection, HYDROmorphone (DILAUDID) injection, HYDROmorphone (DILAUDID) injection, ipratropium-albuterol, ondansetron (ZOFRAN) IV,  phenol Hemodynamic parameters:   Intake/Output: 09/22 0701 - 09/23 0700 In: 2731.7 [I.V.:2460.3; IV Piggyback:271.4] Out: 2255 [id number]; Drains:800; Blood:10]  Ventilator  Settings:      LAB RESULTS:  Basic Metabolic Panel: Recent Labs  Lab 09/11/19 1041 09/12/19 0449 09/13/19 0612 09/14/19 0021  NA 135 136 137 137  K 3.9 4.3 4.6 4.2  CL 101 107 107 104  CO2 26  21* 22 23  GLUCOSE 108* 207* 118* 126*  BUN 16 18 26* 26*  CREATININE 0.93 1.06* 0.93 0.84  CALCIUM 8.3* 8.2* 8.8* 8.9  MG  --  1.9  --   --   PHOS  --  3.2  --   --    Liver Function Tests: Recent Labs  Lab 09/11/19 1041 09/12/19 0449  AST 24 21  ALT 18 17  ALKPHOS 56 55  BILITOT 1.0 1.0  PROT 6.0* 6.1*  ALBUMIN 3.3* 3.5   No results for input(s): LIPASE, AMYLASE in the last 168 hours. No results for input(s): AMMONIA in the last 168 hours. CBC: Recent Labs  Lab 09/11/19 1041 09/12/19 0449 09/13/19 0612 09/14/19 0021  WBC 11.8* 14.3* 12.5* 9.9  NEUTROABS 9.4*  --   --   --   HGB 12.8 12.8 11.6* 11.3*  HCT 39.3 40.1 36.9 35.8*  MCV 93.1 94.1 93.7 93.5  PLT 296 343 273 273   Cardiac Enzymes: No results for input(s): CKTOTAL, CKMB, CKMBINDEX, TROPONINI in the last 168 hours. BNP: Invalid input(s): POCBNP CBG: Recent Labs  Lab 09/12/19 0149  GLUCAP 163*     IMAGING RESULTS:  Imaging:    ASSESSMENT AND PLAN    -Multidisciplinary rounds held today    ASSESSMENT   Intestinal ischemia status post diagnostic laparoscopy with small bowel and large bowel resection and wound VAC placement Atrial fibrillation with rapid ventricular rate COPD Obstructive sleep apnea Pulmonary hypertension Hypertension   PLAN Hemodynamic monitoring per ICU protocol Mandatory nocturnal BiPAP or CPAP ABG and chest x-ray as needed Continue diltiazem infusion to maintain heart rate less than 100 bpm Heparin infusion per vascular surgery and general surgery Monitor wound VAC output and notify surgery Antibiotics as above for intra-abdominal infection prophylaxis NG tube to low intermittent suction Keep n.p.o. until advised by surgery Cardiology, general surgery and vascular following Nebulized bronchodilators   Best Practice: Code Status: Full code Diet: N.p.o. GI prophylaxis: Protonix VTE prophylaxis: SCDs and heparin infusion    GI/Nutrition GI PROPHYLAXIS  as indicated DIET-->TF's as tolerated Constipation protocol as indicated  ENDO - ICU hypoglycemic\Hyperglycemia protocol -check FSBS per protocol   ELECTROLYTES -follow labs as needed -replace as needed -pharmacy consultation   DVT/GI PRX ordered -SCDs  TRANSFUSIONS AS NEEDED MONITOR FSBS ASSESS the need for LABS as needed   Critical care provider statement:    Critical care time (minutes):  32   Critical care time was exclusive of:  Separately billable procedures and treating other patients   Critical care was necessary to treat or prevent imminent or life-threatening deterioration of the following conditions:  necrotic bowel ischemia. Multiple comorbid conditions   Critical care was time spent personally by me on the following activities:  Development of treatment plan with patient or surrogate, discussions with consultants, evaluation of patient's response to treatment, examination of patient, obtaining history from patient or surrogate, ordering and performing treatments and interventions, ordering and review of laboratory studies and re-evaluation of patient's condition.  I assumed direction of critical care for this patient from another provider in  my specialty: no    This document was prepared using Dragon voice recognition software and may include unintentional dictation errors.    Ottie Glazier, M.D.  Division of [place]

## 2019-09-14 NOTE — Consult Note (Signed)
[place] NOTE   Pharmacy Consult for: Heparin Indication: Atrial fibrillation  Allergies  Allergen Reactions  . Flecainide Shortness Of Breath  . Propafenone Shortness Of Breath and Swelling  . Rivaroxaban Other (See Comments)    Patient Measurements: Height: 5\' 4"  (162.6 cm) Weight: 283 lb 1.1 oz (128.4 kg) IBW/kg (Calculated) : 54.7 Heparin Dosing Weight: 86.4 kg   Vital Signs: Temp: 97.9 F (36.6 C) (09/23 0000) Temp Source: Oral (09/23 0000) BP: 109/91 (09/22 2300) Pulse Rate: 95 (09/22 2300)  Labs: Recent Labs    09/11/19 1041 09/11/19 1711  09/12/19 0449  09/12/19 2258 09/13/19 0612 09/14/19 0021  HGB 12.8  --   --  12.8  --   --  11.6* 11.3*  HCT 39.3  --   --  40.1  --   --  36.9 35.8*  PLT 296  --   --  343  --   --  273 273  APTT  --  25   < >  --    < > 39* 39* 46*  LABPROT  --  15.4*  --   --   --   --   --   --   INR  --  1.2  --   --   --   --   --   --   HEPARINUNFRC  --  2.62*  --  1.65*  --   --  0.96*  --   CREATININE 0.93  --   --  1.06*  --   --  0.93 0.84  TROPONINIHS 9  --   --   --   --   --   --   --    < > = values in this interval not displayed.    Estimated Creatinine Clearance: 84 mL/min (by C-G formula based on SCr of 0.84 mg/dL).  Medications:  Eliquis 5 mg BID- last known dose 09/10/19 @ 2000  Assessment: Patient has a PMH significant for atrial fibrillation (on Eliquis), pulmonary HTN, PAD who presents with complaints of abdominal pain, N/V, and diarrhea. Patient found to have ischemic colitis now s/p small bowel resection and right colectomy which was left in discontinuity on 9/21. Return to OR on 9/22 for anastomosis. Patient on heparin drip this admission which has been held around surgeries.   Per surgeon ok to re-start heparin today at 1800. Following aPTTs for now due to apixaban interference with anti-Xa levels.   Goal of Therapy:  Heparin level 0.3-0.7 units/ml aPTT 66-102s seconds Monitor platelets by  anticoagulation protocol: Yes   Plan:  09/23 @ 0021 aPTT 39 seconds subtherapeutic. Will increase rate to 1650 units/hr and will recheck aPTT @ 0800, H/h trending down will continue to monitor.  Tobie Lords, PharmD, BCPS Clinical Pharmacist 09/14/2019

## 2019-09-14 NOTE — Evaluation (Signed)
Physical Therapy Evaluation Patient Details Name: Laura [name] MRN: [id number] DOB: 05-20-1951 Today's Date: 09/14/2019   History of Present Illness  Pt is a 68 y.o. female presenting to hospital 9/20 with abdominal pain, N/V, and diarrhea s/p fall 9/19; pt also noted to be in a-fib with RVR.  Pt admitted for enteritis with concern for ischemic bowel, a-fib with RVR, and htn.  Pt s/p 9/21 diagnostic laparoscopy, laparoscopic assisted small bowel and large bowel resection, and negative pressure dressing placement; pt also s/p 9/21 aortogram and selective angiogram of celiac artery, SMA, and IMA.  9/22 s/p reopening of recent laparotomy, ileum to transverse colon anastomosis, delayed abdominal wall closure, and implantation of negative pressure dressing.  PMH includes h/o a-fib on Eliquis, asthma, htn, OSA, pulmonary htn, COPD, and PAD; use of O2 bedtime.  Clinical Impression  Prior to hospital admission, pt was independent and working; occasional use of SPC or used scooter as needed.  Pt lives with her husband in 1 level home with 3 steps to enter with B railings.  Currently pt is CGA to stand from bed and min assist to step forward and backward without UE support.  Limited/modified activity during session d/t HR increasing to 131 bpm with functional activity and ex's in bed (HR decreased to mid 90's with rest break).  Abdominal incision pain 8/10 beginning and end of session.  Generalized weakness noted.  Anticipate pt would benefit from RW use with ambulation (pt agreeable to improve balance/support with walking).  Pt would benefit from skilled PT to address noted impairments and functional limitations (see below for any additional details).  Upon hospital discharge, recommend pt discharge with HHPT and support of family.    Follow Up Recommendations Home health PT    Equipment Recommendations  Rolling walker with 5" wheels;3in1 (PT)    Recommendations for Other Services       Precautions /  Restrictions Precautions Precautions: Fall Precaution Comments: long abdominal incision Restrictions Weight Bearing Restrictions: No      Mobility  Bed Mobility Overal bed mobility: Needs Assistance Bed Mobility: Sit to Supine       Sit to supine: Mod assist;HOB elevated   General bed mobility comments: nurse assisted pt with LE's; assist for multiple lines  Transfers Overall transfer level: Needs assistance Equipment used: None Transfers: Sit to/from Stand Sit to Stand: Min guard         General transfer comment: increased effort to stand with R UE support on bed rail and L UE pushing up from bed  Ambulation/Gait Ambulation/Gait assistance: Min assist Gait Distance (Feet): (1 step forward and backward B LE's) Assistive device: None   Gait velocity: decreased   General Gait Details: decreased B LE step length; increased effort to take steps; wide BOS  Stairs            Wheelchair Mobility    Modified Rankin (Stroke Patients Only)       Balance Overall balance assessment: Needs assistance Sitting-balance support: No upper extremity supported;Feet supported Sitting balance-Leahy Scale: Good Sitting balance - Comments: steady sitting reaching within BOS   Standing balance support: No upper extremity supported Standing balance-Leahy Scale: Good Standing balance comment: steady static standing reaching within BOS                             Pertinent Vitals/Pain Pain Assessment: 0-10 Pain Score: 8  Pain Location: abdominal incision Pain Descriptors / Indicators: Tender;Sore;Discomfort Pain  Intervention(s): Limited activity within patient's tolerance;Monitored during session;Repositioned;Patient requesting pain meds-RN notified(RN present and aware of pt's request for pain meds)  O2 sats WFL during session's activities.    Home Living Family/patient expects to be discharged to:: Private residence Living Arrangements: Spouse/significant  other Available Help at Discharge: Family Type of Home: House Home Access: Stairs to enter Entrance Stairs-Rails: Right;Left;Can reach both Technical brewer of Steps: 3 Home Layout: One level Home Equipment: Grab bars - tub/shower      Prior Function Level of Independence: Independent with assistive device(s)         Comments: Works [employer]).  Uses SPC as needed (keeps one in car and one in home).  Has pool at home.  Also uses scooter as needed at work and home.     Hand Dominance        Extremity/Trunk Assessment   Upper Extremity Assessment Upper Extremity Assessment: Generalized weakness    Lower Extremity Assessment Lower Extremity Assessment: Generalized weakness    Cervical / Trunk Assessment Cervical / Trunk Assessment: Normal  Communication   Communication: No difficulties  Cognition Arousal/Alertness: Awake/alert Behavior During Therapy: WFL for tasks assessed/performed Overall Cognitive Status: Within Functional Limits for tasks assessed                                 General Comments: Talkative      General Comments General comments (skin integrity, edema, etc.): No drainage noted abdominal incision end of session.  Nursing cleared pt for participation in physical therapy.  Pt agreeable to PT session.  Nurse was just assisting pt back to bed from recliner upon PT arrival.  Pt reports removal of NG tube, foley cath, and wound vac now.    Exercises Total Joint Exercises Ankle Circles/Pumps: AROM;Strengthening;Both;10 reps;Supine Quad Sets: AROM;Strengthening;Both;10 reps;Supine Short Arc Quad: AROM;Strengthening;Both;10 reps;Supine Heel Slides: AROM;Strengthening;Both;10 reps;Supine Hip ABduction/ADduction: AAROM;AROM;Strengthening;Both;10 reps;Supine(opposite leg with knee flexed and foot flat on bed to help support core (pt reported no increased pain with this exercise))   Assessment/Plan    PT Assessment Patient needs  continued PT services  PT Problem List Decreased strength;Decreased activity tolerance;Decreased balance;Decreased mobility;Decreased knowledge of use of DME;Decreased knowledge of precautions;Cardiopulmonary status limiting activity;Pain;Decreased skin integrity       PT Treatment Interventions DME instruction;Gait training;Stair training;Functional mobility training;Therapeutic activities;Therapeutic exercise;Balance training;Patient/family education    PT Goals (Current goals can be found in the Care Plan section)  Acute Rehab PT Goals Patient Stated Goal: to improve strength and mobility PT Goal Formulation: With patient Time For Goal Achievement: 09/28/19 Potential to Achieve Goals: Good    Frequency Min 2X/week   Barriers to discharge        Co-evaluation               AM-PAC PT "6 Clicks" Mobility  Outcome Measure Help needed turning from your back to your side while in a flat bed without using bedrails?: A Little Help needed moving from lying on your back to sitting on the side of a flat bed without using bedrails?: A Little Help needed moving to and from a bed to a chair (including a wheelchair)?: A Little Help needed standing up from a chair using your arms (e.g., wheelchair or bedside chair)?: A Little Help needed to walk in hospital room?: A Little Help needed climbing 3-5 steps with a railing? : A Lot 6 Click Score: 17    End of Session  Activity Tolerance: Patient tolerated treatment well;No increased pain Patient left: in bed;with call bell/phone within reach;with bed alarm set;with SCD's reapplied Nurse Communication: Mobility status;Precautions;Patient requests pain meds PT Visit Diagnosis: Unsteadiness on feet (R26.81);Other abnormalities of gait and mobility (R26.89);Muscle weakness (generalized) (M62.81);History of falling (Z91.81)    Time: [id number] PT Time Calculation (min) (ACUTE ONLY): 38 min   Charges:   PT Evaluation $PT Eval Low  Complexity: 1 Low PT Treatments $Therapeutic Exercise: 8-22 mins $Therapeutic Activity: 8-22 mins       Leitha Bleak, PT 09/14/19, 2:25 PM [phone]

## 2019-09-15 DIAGNOSIS — J441 Chronic obstructive pulmonary disease with (acute) exacerbation: Secondary | ICD-10-CM

## 2019-09-15 LAB — BASIC METABOLIC PANEL
Anion gap: 11 (ref 5–15)
BUN: 24 mg/dL — ABNORMAL HIGH (ref 8–23)
CO2: 22 mmol/L (ref 22–32)
Calcium: 8.8 mg/dL — ABNORMAL LOW (ref 8.9–10.3)
Chloride: 105 mmol/L (ref 98–111)
Creatinine, Ser: 0.81 mg/dL (ref 0.44–1.00)
GFR calc Af Amer: >60 mL/min (ref >60)
GFR calc non Af Amer: >60 mL/min (ref >60)
Glucose, Bld: 152 mg/dL — ABNORMAL HIGH (ref 70–99)
Potassium: 4.1 mmol/L (ref 3.5–5.1)
Sodium: 138 mmol/L (ref 135–145)

## 2019-09-15 LAB — CBC
HCT: 34.2 % — ABNORMAL LOW (ref 36.0–46.0)
Hemoglobin: 11.1 g/dL — ABNORMAL LOW (ref 12.0–15.0)
MCH: 29.8 pg (ref 26.0–34.0)
MCHC: 32.5 g/dL (ref 30.0–36.0)
MCV: 91.7 fL (ref 80.0–100.0)
Platelets: 290 10*3/uL (ref 150–400)
RBC: 3.73 MIL/uL — ABNORMAL LOW (ref 3.87–5.11)
RDW: 13.8 % (ref 11.5–15.5)
WBC: 6.3 10*3/uL (ref 4.0–10.5)
nRBC: 0 % (ref 0.0–0.2)

## 2019-09-15 LAB — HEPARIN LEVEL (UNFRACTIONATED): Heparin Unfractionated: 0.9 IU/mL — ABNORMAL HIGH (ref 0.30–0.70)

## 2019-09-15 LAB — APTT: aPTT: 86 seconds — ABNORMAL HIGH (ref 24–36)

## 2019-09-15 MED ORDER — SODIUM CHLORIDE 0.9 % IV SOLN
INTRAVENOUS | Status: DC | PRN
  Administered 2019-09-15 – 2019-09-18 (×3): via INTRAVENOUS

## 2019-09-15 MED ORDER — DIPHENHYDRAMINE HCL 25 MG PO CAPS
50.0000 mg | ORAL_CAPSULE | Freq: Every evening | ORAL | Status: DC | PRN
  Administered 2019-09-15: 50 mg via ORAL
  Filled 2019-09-15: qty 2

## 2019-09-15 MED ORDER — PREDNISONE 20 MG PO TABS
10.0000 mg | ORAL_TABLET | Freq: Every day | ORAL | Status: DC
  Administered 2019-09-16 – 2019-09-20 (×5): 10 mg via ORAL
  Filled 2019-09-15 (×5): qty 1

## 2019-09-15 MED ORDER — APIXABAN 5 MG PO TABS
5.0000 mg | ORAL_TABLET | Freq: Two times a day (BID) | ORAL | Status: DC
  Administered 2019-09-15 – 2019-09-20 (×11): 5 mg via ORAL
  Filled 2019-09-15 (×12): qty 1

## 2019-09-15 MED ORDER — DILTIAZEM HCL ER COATED BEADS 120 MG PO CP24
360.0000 mg | ORAL_CAPSULE | Freq: Every day | ORAL | Status: DC
  Administered 2019-09-15 – 2019-09-20 (×6): 360 mg via ORAL
  Filled 2019-09-15: qty 2
  Filled 2019-09-15 (×5): qty 3

## 2019-09-15 NOTE — Anesthesia Postprocedure Evaluation (Signed)
Anesthesia Post Note  Patient: Laura [name]  Procedure(s) Performed: REOPENING OF RECENT LAPAROTOMYANASTOMOSIS OF BOWEL (N/A )  Patient location during evaluation: SICU Anesthesia Type: General Level of consciousness: awake and alert and oriented Pain management: pain level controlled Vital Signs Assessment: post-procedure vital signs reviewed and stable Respiratory status: spontaneous breathing and respiratory function stable Cardiovascular status: stable Postop Assessment: no apparent nausea or vomiting and no headache Anesthetic complications: no     Last Vitals:  Vitals:   09/15/19 0700 09/15/19 0741  BP: 138/85   Pulse: (!) 50   Resp:    Temp:    SpO2: 95% 99%    Last Pain:  Vitals:   09/15/19 0507  TempSrc:   PainSc: 6                  Caryl Asp

## 2019-09-15 NOTE — Progress Notes (Signed)
Pharmacy Antibiotic Note  KINZLI TAUZIN is a 68 y.o. female admitted on 09/11/2019 with colitis. History significant for chronic atrial fibrillation, hypertension, pulmonary hypertension, COPD, peripheral artery disease. CT scan of abdomen and pelvis revealed focal small bowel enteritis. Leukocytosis and elevated lactate and PCT of 4.01 on admit. Pharmacy has been consulted for Zosyn dosing.  Patient is POD #3 for small bowel and right colectomy and POD #1 for anastomosis. Today WBC and PCT are down-trending. Patient is afebrile.  Plan: Today is Day 4 of antibiotic therapy; plan is to complete 7 days of total therapy.   Continue Zosyn 3.375g IV q8h (4 hour infusion).   Will complete consult at this time. Will continue to follow along and adjust renally as appropriate.   Height: 5\' 4"  (162.6 cm) Weight: 283 lb 1.1 oz (128.4 kg) IBW/kg (Calculated) : 54.7  Temp (24hrs), Avg:98 F (36.7 C), Min:97.8 F (36.6 C), Max:98.3 F (36.8 C)  Recent Labs  Lab 09/11/19 1041 09/11/19 1318 09/11/19 1711 09/12/19 0153 09/12/19 0449 09/13/19 0612 09/14/19 0021 09/15/19 0508  WBC 11.8*  --   --   --  14.3* 12.5* 9.9 6.3  CREATININE 0.93  --   --   --  1.06* 0.93 0.84 0.81  LATICACIDVEN  --  1.1 1.6 2.5*  --   --   --   --     Estimated Creatinine Clearance: 87.1 mL/min (by C-G formula based on SCr of 0.81 mg/dL).    Allergies  Allergen Reactions  . Flecainide Shortness Of Breath  . Propafenone Shortness Of Breath and Swelling  . Rivaroxaban Other (See Comments)    Antimicrobials this admission: Zosyn 9/20 >> 9/26  Dose adjustments this admission: N/A  Microbiology results: 09/20 Ova + Parasite Exam: pending 09/20 COVID: negative  09/21 MRSA PCR: negative   Thank you for allowing pharmacy to be a part of this patient's care.  Simpson,Michael L  09/15/2019 2:02 PM

## 2019-09-15 NOTE — Progress Notes (Signed)
Progress Note  Patient Name: Laura [name] Date of Encounter: 09/15/2019  Primary Cardiologist: Kathlyn Sacramento, MD  Subjective   Patient denies chest pain, palpitations, or racing HR. She is very happy with the care received this admission. No SOB/DOE. No gas or bowel movement yet per patient after NG tube removed.  Inpatient Medications    Scheduled Meds: . apixaban  5 mg Oral BID  . budesonide (PULMICORT) nebulizer solution  0.25 mg Nebulization BID  . Chlorhexidine Gluconate Cloth  6 each Topical Daily  . diltiazem  360 mg Oral Daily  . ipratropium-albuterol  3 mL Nebulization Q6H  . pantoprazole (PROTONIX) IV  40 mg Intravenous Q12H  . [START ON 09/16/2019] predniSONE  10 mg Oral Q breakfast   Continuous Infusions: . piperacillin-tazobactam (ZOSYN)  IV 3.375 g (09/15/19 0832)   PRN Meds: HYDROmorphone (DILAUDID) injection, HYDROmorphone (DILAUDID) injection, ipratropium-albuterol, ondansetron (ZOFRAN) IV, phenol   Vital Signs    Vitals:   09/15/19 1200 09/15/19 1300 09/15/19 1400 09/15/19 1500  BP: (!) 89/70 (!) 129/96 (!) 149/113   Pulse: 63 (!) 106 (!) 122 (!) 112  Resp:      Temp:      TempSrc:      SpO2: (!) 87% 94% 94% 94%  Weight:      Height:        Intake/Output Summary (Last 24 hours) at 09/15/2019 1534 Last data filed at 09/15/2019 1301 Gross per 24 hour  Intake 2932.84 ml  Output 1150 ml  Net 1782.84 ml   Last 3 Weights 09/12/2019 09/12/2019 09/11/2019  Weight (lbs) 283 lb 1.1 oz 283 lb 1.1 oz 285 lb 12.8 oz  Weight (kg) 128.4 kg 128.4 kg 129.638 kg      Telemetry    IRIR, ventricular rates 110s - 130s - Personally Reviewed  ECG    No new tracings- Personally Reviewed  Physical Exam   GEN: Morbidly obese female in recliner chair. No acute distress.   Neck: JVD difficult to assess due to body habitus. Cardiac: IRIR, no murmurs, rubs, or gallops.  Respiratory: Clear to auscultation bilaterally. Distant breath sounds GI: Somewhat firm   MS: Nonpitting and trace / mild bilateral edema; No deformity. Neuro:  Nonfocal  Psych: Normal affect   Labs    High Sensitivity Troponin:   Recent Labs  Lab 09/11/19 1041  TROPONINIHS 9      Cardiac EnzymesNo results for input(s): TROPONINI in the last 168 hours. No results for input(s): TROPIPOC in the last 168 hours.   Chemistry Recent Labs  Lab 09/11/19 1041 09/12/19 0449 09/13/19 0612 09/14/19 0021 09/15/19 0508  NA 135 136 137 137 138  K 3.9 4.3 4.6 4.2 4.1  CL 101 107 107 104 105  CO2 26 21* 22 23 22   GLUCOSE 108* 207* 118* 126* 152*  BUN 16 18 26* 26* 24*  CREATININE 0.93 1.06* 0.93 0.84 0.81  CALCIUM 8.3* 8.2* 8.8* 8.9 8.8*  PROT 6.0* 6.1*  --   --   --   ALBUMIN 3.3* 3.5  --   --   --   AST 24 21  --   --   --   ALT 18 17  --   --   --   ALKPHOS 56 55  --   --   --   BILITOT 1.0 1.0  --   --   --   GFRNONAA >60 54* >60 >60 >60  GFRAA >60 >60 >60 >60 >60  ANIONGAP  8 8 8 10 11      Hematology Recent Labs  Lab 09/13/19 0612 09/14/19 0021 09/15/19 0508  WBC 12.5* 9.9 6.3  RBC 3.94 3.83* 3.73*  HGB 11.6* 11.3* 11.1*  HCT 36.9 35.8* 34.2*  MCV 93.7 93.5 91.7  MCH 29.4 29.5 29.8  MCHC 31.4 31.6 32.5  RDW 14.0 13.9 13.8  PLT 273 273 290    BNPNo results for input(s): BNP, PROBNP in the last 168 hours.   DDimer No results for input(s): DDIMER in the last 168 hours.   Radiology    No results found.  Cardiac Studies   08/03/2019 [facility] echo (see scanned link) LVEF 60%, mild asymmetric LVH. Moderate LAE. Mild RAE. AV with clcified nodular thickening. Mild MR. Mild TR. Trace pulmonic regurgitation.   Patient Profile     68 y.o. female with a h/o chronic Afib with RVR, COPD, OSA, and seen for Afib with RVR in setting of ischemic bowel s/p surgical intervention.   Assessment & Plan    Afib with RVR --Asymptomatic in Afib. Ventricular rates mainly controlled in the low 100s, increasing to the 130s with minimal activity, however. Most recent echo  above with EF normal. --Currently on oral Cardizem CD 360mg  daily for rate control. Consider that oral medications might not be optimal at this time given s/p recent surgery for ischemic bowel and patient has not yet had gas/ bowel movement since surgery.  --Consider trial of IV rate control with pushes of diltiazem to confirm oral absorption of medications. If confirm absorption of PO medications, recommend shorter acting rate control agents over that of longer acting at this time to optimize rate control with labile pressures. Continue PRN IV rate control.  --Consider restart of IV heparin for [place]. Will need to confirm oral absorption of medications before restart [place]. CHA2DS2VASc score of at least  3 (HTN, age, female).  HTN --BP sub-optimal with labile pressures this admission. Continue to monitor. Medication changes as above.  Ischemic bowel --Per surgery. Consider oral absorption of medications may be sub-optimal at this time.      For questions or updates, please contact [place] Please consult [email or web address] for contact info under        Signed, Arvil Chaco, PA-C  09/15/2019, 3:34 PM

## 2019-09-15 NOTE — Progress Notes (Signed)
Pt transferred to RM # [id number] at this time. VSS prior to transfer.

## 2019-09-15 NOTE — Progress Notes (Signed)
CRITICAL CARE PROGRESS NOTE    Name: Laura [name] MRN: [id number] DOB: [birth year]     LOS: 4   SUBJECTIVE FINDINGS & SIGNIFICANT EVENTS   Patient description:  68 year old female admitted with nausea, vomiting, atrial fibrillation and abdominal pain; CT abdomen suggested possible ischemia.  She was taken for an emergent laparoscopy with small and large bowel resection and wound VAC placement.  She was transferred to the ICU for close monitoring of her respiratory status   Protocols / Consultants: Surgery  Cardiology   Tests / Events: S/p surgery   Overnight: Clinically improved post op , reports no pain, eating well, foley is out    PAST MEDICAL HISTORY   Past Medical History:  Diagnosis Date  . Asthma   . Chronic atrial fibrillation    a. diagnosed in 09/2016; b. failed flecainide and propafenone due to LE swelling and SOB, could not afford Multaq; c. CHADS2VASc => 3 (HTN, age x 1, female); d. on Eliquis  . GERD (gastroesophageal reflux disease)   . History of stress test    a. Lexiscan Myoview 10/2016: no evidence of ischemia, EF 53%  . Hypertension   . Obesity   . Obstructive sleep apnea   . Pulmonary hypertension ([place])   . Systolic dysfunction    a. TTE 10/2016: EF 50%, mild LVH, moderately dilated LA, moderate MR/TR, mild pulmonary hypertension     SURGICAL HISTORY   Past Surgical History:  Procedure Laterality Date  . BOWEL RESECTION  09/11/2019   Procedure: SMALL BOWEL RESECTION;  Surgeon: Herbert Pun, MD;  Location: [facility];  Service: General;;  . cataract surgery    . INCISION AND DRAINAGE ABSCESS Right 06/29/2016   Procedure: INCISION AND DRAINAGE ABSCESS;  Surgeon: Florene Glen, MD;  Location: [facility];  Service: General;  Laterality: Right;  . INCISION AND  DRAINAGE OF WOUND Left 06/29/2016   Procedure: IRRIGATION AND DEBRIDEMENT WOUND;  Surgeon: Florene Glen, MD;  Location: [facility];  Service: General;  Laterality: Left;  . LAPAROSCOPIC RIGHT COLECTOMY  09/11/2019   Procedure: RIGHT COLECTOMY;  Surgeon: Herbert Pun, MD;  Location: [facility];  Service: General;;  . LAPAROSCOPY N/A 09/11/2019   Procedure: LAPAROSCOPY DIAGNOSTIC;  Surgeon: Herbert Pun, MD;  Location: [facility];  Service: General;  Laterality: N/A;  . LAPAROTOMY N/A 09/13/2019   Procedure: REOPENING OF RECENT LAPAROTOMYANASTOMOSIS OF BOWEL;  Surgeon: Herbert Pun, MD;  Location: [facility];  Service: General;  Laterality: N/A;  . VISCERAL ANGIOGRAPHY N/A 09/12/2019   Procedure: VISCERAL ANGIOGRAPHY;  Surgeon: Algernon Huxley, MD;  Location: [place] [facility];  Service: Cardiovascular;  Laterality: N/A;     FAMILY HISTORY   Family History  Problem Relation Age of Onset  . Dementia Mother   . Osteoporosis Mother   . Vascular Disease Mother   . COPD Father      SOCIAL HISTORY   Social History   Tobacco Use  . Smoking status: Never Smoker  . Smokeless tobacco: Never Used  Substance Use Topics  . Alcohol use: No  . Drug use: No     MEDICATIONS   Current Medication:  Current Facility-Administered Medications:  .  apixaban (ELIQUIS) tablet 5 mg, 5 mg, Oral, BID, Gladstone Lighter, MD, 5 mg at 09/15/19 0826 .  budesonide (PULMICORT) nebulizer solution 0.25 mg, 0.25 mg, Nebulization, BID, Lanney Gins, Akshar Starnes, MD, 0.25 mg at 09/15/19 0740 .  Chlorhexidine Gluconate Cloth 2 % PADS 6 each, 6 each, Topical, Daily, Cintron-Diaz, Edgardo,  MD, 6 each at 09/14/19 1143 .  diltiazem (CARDIZEM CD) 24 hr capsule 360 mg, 360 mg, Oral, Daily, Kalisetti, Radhika, MD, 360 mg at 09/15/19 0907 .  HYDROmorphone (DILAUDID) injection 1 mg, 1 mg, Intravenous, Q4H PRN, Herbert Pun, MD, 1 mg at 09/14/19 0848 .  HYDROmorphone (DILAUDID) injection 1 mg, 1 mg,  Intravenous, Once PRN, Herbert Pun, MD .  ipratropium-albuterol (DUONEB) 0.5-2.5 (3) MG/3ML nebulizer solution 3 mL, 3 mL, Nebulization, Q4H PRN, Herbert Pun, MD, 3 mL at 09/15/19 0548 .  ipratropium-albuterol (DUONEB) 0.5-2.5 (3) MG/3ML nebulizer solution 3 mL, 3 mL, Nebulization, Q6H, Lanney Gins, Avyonna Wagoner, MD, 3 mL at 09/14/19 2049 .  ondansetron (ZOFRAN) injection 4 mg, 4 mg, Intravenous, Q6H PRN, Herbert Pun, MD, 4 mg at 09/15/19 0827 .  pantoprazole (PROTONIX) injection 40 mg, 40 mg, Intravenous, Q12H, Herbert Pun, MD, 40 mg at 09/15/19 0826 .  phenol (CHLORASEPTIC) mouth spray 1 spray, 1 spray, Mouth/Throat, PRN, Ottie Glazier, MD, 1 spray at 09/14/19 1120 .  piperacillin-tazobactam (ZOSYN) IVPB 3.375 g, 3.375 g, Intravenous, Q8H, Blakeney, Dana G, NP, Last Rate: 12.5 mL/hr at 09/15/19 0832, 3.375 g at 09/15/19 0832 .  [START ON 09/16/2019] predniSONE (DELTASONE) tablet 10 mg, 10 mg, Oral, Q breakfast, Blakeney, Dreama Saa, NP    ALLERGIES   Flecainide, Propafenone, and Rivaroxaban    REVIEW OF SYSTEMS     10  Point ROS neg except for abd pain  PHYSICAL EXAMINATION   Vital Signs: Temp:  [97.8 F (36.6 C)-98.3 F (36.8 C)] 98 F (36.7 C) (09/24 0800) Pulse Rate:  [35-136] 106 (09/24 1000) Resp:  [20] 20 (09/24 0800) BP: (102-148)/(56-137) 142/71 (09/24 1000) SpO2:  [91 %-100 %] 95 % (09/24 1000)  GENERAL:mild pain  HEAD: Normocephalic, atraumatic.  EYES: Pupils equal, round, reactive to light.  No scleral icterus.  MOUTH: Moist mucosal membrane. NECK: Supple. No thyromegaly. No nodules. No JVD.  PULMONARY: ctab CARDIOVASCULAR: S1 and S2. Regular rate and rhythm. No murmurs, rubs, or gallops.  GASTROINTESTINAL: Soft, nontender, non-distended. No masses. Positive bowel sounds. No hepatosplenomegaly.  MUSCULOSKELETAL: No swelling, clubbing, or edema.  NEUROLOGIC: Mild distress due to acute illness SKIN:intact,warm,dry   PERTINENT DATA      Infusions: . piperacillin-tazobactam (ZOSYN)  IV 3.375 g (09/15/19 [id number])   Scheduled Medications: . apixaban  5 mg Oral BID  . budesonide (PULMICORT) nebulizer solution  0.25 mg Nebulization BID  . Chlorhexidine Gluconate Cloth  6 each Topical Daily  . diltiazem  360 mg Oral Daily  . ipratropium-albuterol  3 mL Nebulization Q6H  . pantoprazole (PROTONIX) IV  40 mg Intravenous Q12H  . [START ON 09/16/2019] predniSONE  10 mg Oral Q breakfast   PRN Medications: HYDROmorphone (DILAUDID) injection, HYDROmorphone (DILAUDID) injection, ipratropium-albuterol, ondansetron (ZOFRAN) IV, phenol Hemodynamic parameters:   Intake/Output: 09/23 0701 - 09/24 0700 In: 2248.9 [P.O.:240; I.V.:1817.9; IV Piggyback:191] Out: 950 [Urine:950]  Ventilator  Settings:      LAB RESULTS:  Basic Metabolic Panel: Recent Labs  Lab 09/11/19 1041 09/12/19 0449 09/13/19 0612 09/14/19 0021 09/15/19 0508  NA 135 136 137 137 138  K 3.9 4.3 4.6 4.2 4.1  CL 101 107 107 104 105  CO2 26 21* 22 23 22   GLUCOSE 108* 207* 118* 126* 152*  BUN 16 18 26* 26* 24*  CREATININE 0.93 1.06* 0.93 0.84 0.81  CALCIUM 8.3* 8.2* 8.8* 8.9 8.8*  MG  --  1.9  --   --   --   PHOS  --  3.2  --   --   --  Liver Function Tests: Recent Labs  Lab 09/11/19 1041 09/12/19 0449  AST 24 21  ALT 18 17  ALKPHOS 56 55  BILITOT 1.0 1.0  PROT 6.0* 6.1*  ALBUMIN 3.3* 3.5   No results for input(s): LIPASE, AMYLASE in the last 168 hours. No results for input(s): AMMONIA in the last 168 hours. CBC: Recent Labs  Lab 09/11/19 1041 09/12/19 0449 09/13/19 0612 09/14/19 0021 09/15/19 0508  WBC 11.8* 14.3* 12.5* 9.9 6.3  NEUTROABS 9.4*  --   --   --   --   HGB 12.8 12.8 11.6* 11.3* 11.1*  HCT 39.3 40.1 36.9 35.8* 34.2*  MCV 93.1 94.1 93.7 93.5 91.7  PLT 296 343 273 273 290   Cardiac Enzymes: No results for input(s): CKTOTAL, CKMB, CKMBINDEX, TROPONINI in the last 168 hours. BNP: Invalid input(s): POCBNP CBG: Recent  Labs  Lab 09/12/19 0149  GLUCAP 163*     IMAGING RESULTS:  Imaging:    ASSESSMENT AND PLAN    -Multidisciplinary rounds held today    ASSESSMENT   Intestinal ischemia status post diagnostic laparoscopy with small bowel and large bowel resection  Atrial fibrillation with rapid ventricular rate COPD Obstructive sleep apnea Pulmonary hypertension Hypertension   PLAN Hemodynamic monitoring per ICU protocol Antibiotics as above for intra-abdominal infection prophylaxis NG tube is out Advance diet    Best Practice: Code Status: Full code Diet: advance GI prophylaxis: Protonix VTE prophylaxis: SCDs and heparin infusion    GI/Nutrition GI PROPHYLAXIS as indicated DIET-->TF's as tolerated Constipation protocol as indicated  ENDO - ICU hypoglycemic\Hyperglycemia protocol -check FSBS per protocol   ELECTROLYTES -follow labs as needed -replace as needed -pharmacy consultation   DVT/GI PRX ordered -SCDs  TRANSFUSIONS AS NEEDED MONITOR FSBS ASSESS the need for LABS as needed   Critical care provider statement:    Critical care time (minutes):  32   Critical care time was exclusive of:  Separately billable procedures and treating other patients   Critical care was necessary to treat or prevent imminent or life-threatening deterioration of the following conditions:  necrotic bowel ischemia. Multiple comorbid conditions   Critical care was time spent personally by me on the following activities:  Development of treatment plan with patient or surrogate, discussions with consultants, evaluation of patient's response to treatment, examination of patient, obtaining history from patient or surrogate, ordering and performing treatments and interventions, ordering and review of laboratory studies and re-evaluation of patient's condition.  I assumed direction of critical care for this patient from another provider in my specialty: no    This document was prepared  using Dragon voice recognition software and may include unintentional dictation errors.    Ottie Glazier, M.D.  Division of [place]

## 2019-09-15 NOTE — Progress Notes (Signed)
[facility] [facility] [place] NAME: Laura [name]    MR#:  [id number]  DATE OF BIRTH:  04-Oct-1951  SUBJECTIVE:  CHIEF COMPLAINT:   Chief Complaint  Patient presents with  . Abdominal Pain  . Fall  . Tachycardia   - s/p small bowel and colon anastomosis done after bowel resection done for ischemic colitis this admission. -Patient is doing well today.  On clear liquid diet, NG tube is out.  Mild shortness of breath.  Heart rate is well controlled.  REVIEW OF SYSTEMS:  Review of Systems  Constitutional: Negative for chills, fever and malaise/fatigue.  HENT: Negative for congestion, ear discharge, hearing loss and nosebleeds.   Eyes: Negative for blurred vision and double vision.  Respiratory: Positive for shortness of breath. Negative for cough and wheezing.   Cardiovascular: Negative for chest pain and palpitations.  Gastrointestinal: Positive for abdominal pain. Negative for constipation, diarrhea, nausea and vomiting.  Genitourinary: Negative for dysuria.  Musculoskeletal: Positive for myalgias.  Neurological: Positive for weakness. Negative for dizziness, focal weakness, seizures and headaches.  Psychiatric/Behavioral: Negative for depression.    DRUG ALLERGIES:   Allergies  Allergen Reactions  . Flecainide Shortness Of Breath  . Propafenone Shortness Of Breath and Swelling  . Rivaroxaban Other (See Comments)    VITALS:  Blood pressure 138/85, pulse (!) 50, temperature 97.8 F (36.6 C), temperature source Oral, resp. rate (!) 24, height 5\' 4"  (1.626 m), weight 128.4 kg, SpO2 95 %.  PHYSICAL EXAMINATION:  Physical Exam  GENERAL:  68 y.o.-year-old obese patient lying in the bed, in distress due to abdominal pain.  EYES: Pupils equal, round, reactive to light and accommodation. No scleral icterus. Extraocular muscles intact.  HEENT: Head atraumatic, normocephalic. Oropharynx and nasopharynx clear.  NECK:  Supple, no jugular venous  distention. No thyroid enlargement, no tenderness.  LUNGS: Normal breath sounds bilaterally, no wheezing, rales,rhonchi or crepitation. No use of accessory muscles of respiration.  Decreased bibasilar breath sounds CARDIOVASCULAR: S1, S2 regular rate and irregular rhythm. No  rubs, or gallops.  2/6 systolic murmur is present ABDOMEN: Mid abdominal suture with staples in place, diffuse tenderness but much improved today..  Hypoactive bowel sounds present. No organomegaly or mass.  EXTREMITIES: No pedal edema, cyanosis, or clubbing.  NEUROLOGIC: Cranial nerves II through XII are intact. Muscle strength 5/5 in all extremities. Sensation intact. Gait not checked.  Global weakness noted PSYCHIATRIC: The patient is alert and oriented x 3.  SKIN: No obvious rash, lesion, or ulcer.    LABORATORY PANEL:   CBC Recent Labs  Lab 09/15/19 0508  WBC 6.3  HGB 11.1*  HCT 34.2*  PLT 290   ------------------------------------------------------------------------------------------------------------------  Chemistries  Recent Labs  Lab 09/12/19 0449  09/15/19 0508  NA 136   < > 138  K 4.3   < > 4.1  CL 107   < > 105  CO2 21*   < > 22  GLUCOSE 207*   < > 152*  BUN 18   < > 24*  CREATININE 1.06*   < > 0.81  CALCIUM 8.2*   < > 8.8*  MG 1.9  --   --   AST 21  --   --   ALT 17  --   --   ALKPHOS 55  --   --   BILITOT 1.0  --   --    < > = values in this interval not displayed.   ------------------------------------------------------------------------------------------------------------------  Cardiac  Enzymes No results for input(s): TROPONINI in the last 168 hours. ------------------------------------------------------------------------------------------------------------------  RADIOLOGY:  No results found.  EKG:   Orders placed or performed during the hospital encounter of 09/11/19  . EKG 12-Lead  . EKG 12-Lead  . EKG 12-Lead  . EKG 12-Lead  . EKG 12-Lead  . EKG 12-Lead  . ED EKG   . ED EKG    ASSESSMENT AND PLAN:   68 year old female with past medical history significant for chronic A. fib on sleep apnea, PAD, hypertension presents to hospital secondary to abdominal pain and noted to have ischemic colitis.  1.  Ischemic colitis-appreciate general surgery consult and vascular consult -Patient had surgery on admission with ileum and right colectomy, and had a repeat surgery for ad anastomosis.  She does not have a colostomy.  Abdominal wound VAC is removed, has staples in place. -Mesenteric angiogram with no major vessel stenosis which has been done this admission..   - On IV antibiotics with Zosyn-stop after 7 days. -NG tube is remote, on clear liquid diet. -Continue further management per surgical team -Encourage ambulation.  2.  Atrial fibrillation with RVR-since now on clear liquid diet, discontinue Cardizem drip and heparin drip. -Restarted oral Eliquis and oral Cardizem at home doses this morning -Appreciate cardiology consult -Continue to monitor on telemetry  3.  GERD-Protonix  4.  Sleep apnea-continue CPAP -Had minimal wheezing yesterday, was started on low-dose IV steroids.  Discontinue IV fluids -Neb treatments.  Steroids can be changed to p.o.  5.  DVT prophylaxis-started on Eliquis   Physical therapy consulted need home health at discharge. -Medically stable, can be transitioned to floor today    All the records are reviewed and case discussed with Care Management/Social Workerr. Management plans discussed with the patient, family and they are in agreement.  CODE STATUS: Full code  TOTAL TIME TAKING CARE OF THIS PATIENT: 39 minutes.   POSSIBLE D/C IN 2 DAYS, DEPENDING ON CLINICAL CONDITION.   Gladstone Lighter M.D on 09/15/2019 at 7:35 AM  Between 7am to 6pm - Pager - [phone]  After 6pm go to [email or web address] - password [email or web address] [facility]  Office  [phone]  CC: Primary care physician; [name], MD

## 2019-09-16 ENCOUNTER — Inpatient Hospital Stay: Payer: BC Managed Care – PPO

## 2019-09-16 LAB — BASIC METABOLIC PANEL
Anion gap: 8 (ref 5–15)
BUN: 22 mg/dL (ref 8–23)
CO2: 24 mmol/L (ref 22–32)
Calcium: 8.7 mg/dL — ABNORMAL LOW (ref 8.9–10.3)
Chloride: 107 mmol/L (ref 98–111)
Creatinine, Ser: 0.84 mg/dL (ref 0.44–1.00)
GFR calc Af Amer: >60 mL/min (ref >60)
GFR calc non Af Amer: >60 mL/min (ref >60)
Glucose, Bld: 131 mg/dL — ABNORMAL HIGH (ref 70–99)
Potassium: 4.4 mmol/L (ref 3.5–5.1)
Sodium: 139 mmol/L (ref 135–145)

## 2019-09-16 MED ORDER — NYSTATIN 100000 UNIT/ML MT SUSP
5.0000 mL | Freq: Four times a day (QID) | OROMUCOSAL | Status: DC
  Administered 2019-09-16 – 2019-09-20 (×16): 500000 [IU] via OROMUCOSAL
  Filled 2019-09-16 (×16): qty 5

## 2019-09-16 MED ORDER — IPRATROPIUM-ALBUTEROL 0.5-2.5 (3) MG/3ML IN SOLN
3.0000 mL | Freq: Three times a day (TID) | RESPIRATORY_TRACT | Status: DC
  Administered 2019-09-17 – 2019-09-20 (×10): 3 mL via RESPIRATORY_TRACT
  Filled 2019-09-16 (×11): qty 3

## 2019-09-16 MED ORDER — DOCUSATE SODIUM 100 MG PO CAPS
100.0000 mg | ORAL_CAPSULE | Freq: Two times a day (BID) | ORAL | Status: DC
  Administered 2019-09-16 – 2019-09-20 (×9): 100 mg via ORAL
  Filled 2019-09-16 (×9): qty 1

## 2019-09-16 MED ORDER — PROMETHAZINE HCL 25 MG/ML IJ SOLN
12.5000 mg | Freq: Once | INTRAMUSCULAR | Status: AC
  Administered 2019-09-16: 12.5 mg via INTRAVENOUS
  Filled 2019-09-16: qty 1

## 2019-09-16 NOTE — Progress Notes (Signed)
Progress Note  Patient Name: Laura [name] Date of Encounter: 09/16/2019  Primary Cardiologist: Kathlyn Sacramento, MD  Subjective   Difficult afternoon, reports having nausea, no improvement with nausea medication Feels that if she could just get up walk around she might be able to have a bowel movement Feels pressure lower epigastric, suprapubic area Not hungry for lunch  Inpatient Medications    Scheduled Meds: . apixaban  5 mg Oral BID  . budesonide (PULMICORT) nebulizer solution  0.25 mg Nebulization BID  . Chlorhexidine Gluconate Cloth  6 each Topical Daily  . diltiazem  360 mg Oral Daily  . docusate sodium  100 mg Oral BID  . ipratropium-albuterol  3 mL Nebulization Q6H  . nystatin  5 mL Mouth/Throat QID  . pantoprazole (PROTONIX) IV  40 mg Intravenous Q12H  . predniSONE  10 mg Oral Q breakfast   Continuous Infusions: . sodium chloride Stopped (09/16/19 0235)  . piperacillin-tazobactam (ZOSYN)  IV 3.375 g (09/16/19 0936)   PRN Meds: sodium chloride, diphenhydrAMINE, HYDROmorphone (DILAUDID) injection, HYDROmorphone (DILAUDID) injection, ipratropium-albuterol, ondansetron (ZOFRAN) IV, phenol   Vital Signs    Vitals:   09/15/19 2012 09/15/19 2122 09/16/19 0504 09/16/19 1414  BP: (!) 147/91  (!) 151/70 (!) 170/85  Pulse: 81  83 92  Resp: 18  17 19   Temp: 97.8 F (36.6 C)  97.9 F (36.6 C) 97.8 F (36.6 C)  TempSrc: Oral  Oral Oral  SpO2: 96% 95% 94% 96%  Weight:      Height:        Intake/Output Summary (Last 24 hours) at 09/16/2019 1439 Last data filed at 09/16/2019 1002 Gross per 24 hour  Intake 1705.14 ml  Output 2000 ml  Net -294.86 ml   Last 3 Weights 09/12/2019 09/12/2019 09/11/2019  Weight (lbs) 283 lb 1.1 oz 283 lb 1.1 oz 285 lb 12.8 oz  Weight (kg) 128.4 kg 128.4 kg 129.638 kg      Telemetry    Irregularly irregular, rate 80 to 90 bpm- Personally Reviewed  ECG    No new tracings- Personally Reviewed  Physical Exam   Constitutional:   oriented to person, place, and time.  Moderate abdominal distress HENT:  Head: Normocephalic and atraumatic.  Eyes:  no discharge. No scleral icterus.  Neck: Normal range of motion. Neck supple. No JVD present.  Cardiovascular: Normal rate, regular rhythm, normal heart sounds and intact distal pulses. Exam reveals no gallop and no friction rub. No edema No murmur heard. Pulmonary/Chest: Effort normal and breath sounds normal. No stridor. No respiratory distress.  no wheezes.  no rales.  no tenderness.  Abdominal: Soft.  Mildly distended, sutures in place Musculoskeletal: Normal range of motion.  no  tenderness or deformity.  Neurological:  normal muscle tone. Coordination normal. No atrophy Skin: Skin is warm and dry. No rash noted. not diaphoretic.  Psychiatric: Mildly agitated given her abdominal discomfort   Labs    High Sensitivity Troponin:   Recent Labs  Lab 09/11/19 1041  TROPONINIHS 9      Cardiac EnzymesNo results for input(s): TROPONINI in the last 168 hours. No results for input(s): TROPIPOC in the last 168 hours.   Chemistry Recent Labs  Lab 09/11/19 1041 09/12/19 0449  09/14/19 0021 09/15/19 0508 09/16/19 0411  NA 135 136   < > 137 138 139  K 3.9 4.3   < > 4.2 4.1 4.4  CL 101 107   < > 104 105 107  CO2 26 21*   < >  23 22 24   GLUCOSE 108* 207*   < > 126* 152* 131*  BUN 16 18   < > 26* 24* 22  CREATININE 0.93 1.06*   < > 0.84 0.81 0.84  CALCIUM 8.3* 8.2*   < > 8.9 8.8* 8.7*  PROT 6.0* 6.1*  --   --   --   --   ALBUMIN 3.3* 3.5  --   --   --   --   AST 24 21  --   --   --   --   ALT 18 17  --   --   --   --   ALKPHOS 56 55  --   --   --   --   BILITOT 1.0 1.0  --   --   --   --   GFRNONAA >60 54*   < > >60 >60 >60  GFRAA >60 >60   < > >60 >60 >60  ANIONGAP 8 8   < > 10 11 8    < > = values in this interval not displayed.     Hematology Recent Labs  Lab 09/13/19 0612 09/14/19 0021 09/15/19 0508  WBC 12.5* 9.9 6.3  RBC 3.94 3.83* 3.73*  HGB 11.6*  11.3* 11.1*  HCT 36.9 35.8* 34.2*  MCV 93.7 93.5 91.7  MCH 29.4 29.5 29.8  MCHC 31.4 31.6 32.5  RDW 14.0 13.9 13.8  PLT 273 273 290    BNPNo results for input(s): BNP, PROBNP in the last 168 hours.   DDimer No results for input(s): DDIMER in the last 168 hours.   Radiology    No results found.  Cardiac Studies   08/03/2019 [facility] echo (see scanned link) LVEF 60%, mild asymmetric LVH. Moderate LAE. Mild RAE. AV with clcified nodular thickening. Mild MR. Mild TR. Trace pulmonic regurgitation.   Patient Profile     68 y.o. female with a h/o chronic Afib with RVR, COPD, OSA, and seen for Afib with RVR in setting of ischemic bowel s/p surgical intervention.   Assessment & Plan    Afib with RVR  chronic atrial fibrillation Rapid ventricular response following surgery Back on her diltiazem extended release 360 daily Now with nausea, abdominal distention discomfort Unable to pass flatus or bowel movement -Ideally would like to keep on diltiazem extended release 360 with her Eliquis --- If nausea persists with no bowel movements and she develops continued GI distress,  May need to transition back to heparin infusion with diltiazem infusion and hold the oral medication  Ischemic enteritis S/p Small and large bowel resection No flatus or bowel movements today, now with worsening nausea, abdominal discomfort Receiving nausea medication, no significant relief  Obstructive sleep apnea/CPAP Stable  Hypertension Generally well controlled although now labile in the setting of abdominal discomfort   Total encounter time more than 25 minutes  Greater than 50% was spent in counseling and coordination of care with the patient      For questions or updates, please contact [facility] Please consult [email or web address] for contact info under        Signed, Ida Rogue, MD  09/16/2019, 2:39 PM

## 2019-09-16 NOTE — Progress Notes (Signed)
Doctor notified that the patient's incision is leaking a yellow colored fluid between the staples at the upper part of the incision.  4x4 and abd's were put in place to catch the drainage.

## 2019-09-16 NOTE — Progress Notes (Signed)
[facility] Day(s): 5.   Interval History: Patient seen and examined. Patient report feeling better this evening. She reports that she was nauseous in the morning but later today she passed gas and had a bowel movement. Nurse does report changing multiple times the wound dressing due to serous fluid. Denies fever or chills. Denies vomiting. Reports pain is controlled. Patient sitting down on chair.    Vital signs in last 24 hours: [min-max] current  Temp:  [97.8 F (36.6 C)-98 F (36.7 C)] 98 F (36.7 C) (09/25 1956) Pulse Rate:  [79-92] 79 (09/25 1956) Resp:  [17-20] 20 (09/25 1956) BP: (129-170)/(70-91) 129/71 (09/25 1956) SpO2:  [93 %-96 %] 93 % (09/25 1956)     Height: 5\' 4"  (162.6 cm) Weight: 128.4 kg BMI (Calculated): 48.57   Physical Exam:  Constitutional: alert, cooperative and no distress  Respiratory: breathing non-labored at rest  Cardiovascular: irregular rate and rhythm  Gastrointestinal: soft, non-tender, and non-distended. Wound with serous fluid but no erythema. No purulent drainage.   Labs:  CBC Latest Ref Rng & Units 09/15/2019 09/14/2019 09/13/2019  WBC 4.0 - 10.5 K/uL 6.3 9.9 12.5(H)  Hemoglobin 12.0 - 15.0 g/dL 11.1(L) 11.3(L) 11.6(L)  Hematocrit 36.0 - 46.0 % 34.2(L) 35.8(L) 36.9  Platelets 150 - 400 K/uL 290 273 273   CMP Latest Ref Rng & Units 09/16/2019 09/15/2019 09/14/2019  Glucose 70 - 99 mg/dL 131(H) 152(H) 126(H)  BUN 8 - 23 mg/dL 22 24(H) 26(H)  Creatinine 0.44 - 1.00 mg/dL 0.84 0.81 0.84  Sodium 135 - 145 mmol/L 139 138 137  Potassium 3.5 - 5.1 mmol/L 4.4 4.1 4.2  Chloride 98 - 111 mmol/L 107 105 104  CO2 22 - 32 mmol/L 24 22 23   Calcium 8.9 - 10.3 mg/dL 8.7(L) 8.8(L) 8.9  Total Protein 6.5 - 8.1 g/dL - - -  Total Bilirubin 0.3 - 1.2 mg/dL - - -  Alkaline Phos 38 - 126 U/L - - -  AST 15 - 41 U/L - - -  ALT 0 - 44 U/L - - -    Imaging studies: Abdominal xray done this morning shows non specific bowel dilation.     Assessment/Plan:  68 y.o. female with terminal ileum ischemia s/p ileum and right colon resection, with second look and ileotransversostomy, complicated by pertinent comorbidities including atrial fibrillation on anticoagulation, COPD, pulmonary hypertensin, sleep apnea and morbid obesity.  Patient recovering very slowly. This morning with nausea but this evening passing gas and had a bowel movement. Will keep in clear liquid for tonight. If continue passing gas, will consider advance to full liquid. Wound draining serous fluid, no sign of infection. Patient with significant amount of fat tissue on the wound, will continue observation with frequent dressing changes as needed. WBC count normal, no fever, no tachycardia, no cellulitis or purulent secretions. Patient getting out of bed to chair and in great mood. Agree with current management by Hospigtalist.   Arnold Long, MD

## 2019-09-16 NOTE — Progress Notes (Signed)
[place] at [place] NAME: Laura [name]    MR#:  [id number]  DATE OF BIRTH:  [birth year]  SUBJECTIVE:  CHIEF COMPLAINT:   Chief Complaint  Patient presents with  . Abdominal Pain  . Fall  . Tachycardia   - s/p small bowel and colon anastomosis done after bowel resection done for ischemic colitis this admission. -resting with CPAP on, complains of nausea and abdominal pain, still no BM or passing flatus  REVIEW OF SYSTEMS:  Review of Systems  Constitutional: Negative for chills, fever and malaise/fatigue.  HENT: Negative for congestion, ear discharge, hearing loss and nosebleeds.   Eyes: Negative for blurred vision and double vision.  Respiratory: Negative for cough, shortness of breath and wheezing.   Cardiovascular: Negative for chest pain and palpitations.  Gastrointestinal: Positive for abdominal pain and nausea. Negative for constipation, diarrhea and vomiting.  Genitourinary: Negative for dysuria.  Musculoskeletal: Positive for myalgias.  Neurological: Positive for weakness. Negative for dizziness, focal weakness, seizures and headaches.  Psychiatric/Behavioral: Negative for depression.    DRUG ALLERGIES:   Allergies  Allergen Reactions  . Flecainide Shortness Of Breath  . Propafenone Shortness Of Breath and Swelling  . Rivaroxaban Other (See Comments)    VITALS:  Blood pressure (!) 151/70, pulse 83, temperature 97.9 F (36.6 C), temperature source Oral, resp. rate 17, height 5\' 4"  (1.626 m), weight 128.4 kg, SpO2 94 %.  PHYSICAL EXAMINATION:  Physical Exam  GENERAL:  68 y.o.-year-old obese patient lying in the bed, in distress due to abdominal pain.  EYES: Pupils equal, round, reactive to light and accommodation. No scleral icterus. Extraocular muscles intact.  HEENT: Head atraumatic, normocephalic. Oropharynx and nasopharynx clear.  NECK:  Supple, no jugular venous distention. No thyroid enlargement, no tenderness.   LUNGS: Normal breath sounds bilaterally, no wheezing, rales,rhonchi or crepitation. No use of accessory muscles of respiration.  Decreased bibasilar breath sounds CARDIOVASCULAR: S1, S2 regular rate and irregular rhythm. No  rubs, or gallops.  2/6 systolic murmur is present ABDOMEN: Mid abdominal suture with staples in place, diffuse tenderness but much improved today..  Hypoactive bowel sounds present. No organomegaly or mass.  EXTREMITIES: No pedal edema, cyanosis, or clubbing.  NEUROLOGIC: Cranial nerves II through XII are intact. Muscle strength 5/5 in all extremities. Sensation intact. Gait not checked.  Global weakness noted PSYCHIATRIC: The patient is alert and oriented x 3.  SKIN: No obvious rash, lesion, or ulcer.    LABORATORY PANEL:   CBC Recent Labs  Lab 09/15/19 0508  WBC 6.3  HGB 11.1*  HCT 34.2*  PLT 290   ------------------------------------------------------------------------------------------------------------------  Chemistries  Recent Labs  Lab 09/12/19 0449  09/16/19 0411  NA 136   < > 139  K 4.3   < > 4.4  CL 107   < > 107  CO2 21*   < > 24  GLUCOSE 207*   < > 131*  BUN 18   < > 22  CREATININE 1.06*   < > 0.84  CALCIUM 8.2*   < > 8.7*  MG 1.9  --   --   AST 21  --   --   ALT 17  --   --   ALKPHOS 55  --   --   BILITOT 1.0  --   --    < > = values in this interval not displayed.   ------------------------------------------------------------------------------------------------------------------  Cardiac Enzymes No results for input(s): TROPONINI in the last 168 hours. ------------------------------------------------------------------------------------------------------------------  RADIOLOGY:  No results found.  EKG:   Orders placed or performed during the hospital encounter of 09/11/19  . EKG 12-Lead  . EKG 12-Lead  . EKG 12-Lead  . EKG 12-Lead  . EKG 12-Lead  . EKG 12-Lead  . ED EKG  . ED EKG    ASSESSMENT AND PLAN:    68 year old female with past medical history significant for chronic A. fib on sleep apnea, PAD, hypertension presents to hospital secondary to abdominal pain and noted to have ischemic colitis.  1.  Ischemic colitis-appreciate general surgery consult and vascular consult -Patient had surgery on admission with ileum and right colectomy, and had a repeat surgery for ad anastomosis.  She does not have a colostomy.  Abdominal wound VAC is removed, has staples in place. -Mesenteric angiogram with no major vessel stenosis which has been done this admission..   - On IV antibiotics with Zosyn-stop after 7 days. -NG tube is removed- very nauseous today, on clear liquid diet. -Continue further management per surgical team -Encourage ambulation.  2.  Atrial fibrillation with RVR-since now on clear liquid diet, discontinued Cardizem drip and heparin drip. -Restarted oral Eliquis and oral Cardizem at home doses- if nauseous and unable to take oral meds- will change to IV -Appreciate cardiology consult -Continue to monitor on telemetry  3.  GERD-Protonix  4.  Sleep apnea-continue CPAP - off steroids.  Discontinued IV fluids -Neb treatments.  5.  DVT prophylaxis-on Eliquis   Physical therapy consulted need home health at discharge.    All the records are reviewed and case discussed with Care Management/Social Workerr. Management plans discussed with the patient, family and they are in agreement.  CODE STATUS: Full code  TOTAL TIME TAKING CARE OF THIS PATIENT: 37 minutes.   POSSIBLE D/C IN 2 DAYS, DEPENDING ON CLINICAL CONDITION.   Gladstone Lighter M.D on 09/16/2019 at 12:33 PM  Between 7am to 6pm - Pager - [phone]  After 6pm go to [email or web address] - password [email or web address] [facility]  Office  [phone]  CC: Primary care physician; [name], MD

## 2019-09-17 LAB — BASIC METABOLIC PANEL
Anion gap: 7 (ref 5–15)
BUN: 20 mg/dL (ref 8–23)
CO2: 29 mmol/L (ref 22–32)
Calcium: 9 mg/dL (ref 8.9–10.3)
Chloride: 105 mmol/L (ref 98–111)
Creatinine, Ser: 0.93 mg/dL (ref 0.44–1.00)
GFR calc Af Amer: >60 mL/min (ref >60)
GFR calc non Af Amer: >60 mL/min (ref >60)
Glucose, Bld: 100 mg/dL — ABNORMAL HIGH (ref 70–99)
Potassium: 4.8 mmol/L (ref 3.5–5.1)
Sodium: 141 mmol/L (ref 135–145)

## 2019-09-17 LAB — CBC
HCT: 37.9 % (ref 36.0–46.0)
Hemoglobin: 12.3 g/dL (ref 12.0–15.0)
MCH: 30 pg (ref 26.0–34.0)
MCHC: 32.5 g/dL (ref 30.0–36.0)
MCV: 92.4 fL (ref 80.0–100.0)
Platelets: 384 10*3/uL (ref 150–400)
RBC: 4.1 MIL/uL (ref 3.87–5.11)
RDW: 14.2 % (ref 11.5–15.5)
WBC: 10.4 10*3/uL (ref 4.0–10.5)
nRBC: 0.6 % — ABNORMAL HIGH (ref 0.0–0.2)

## 2019-09-17 NOTE — Progress Notes (Signed)
[facility] Day(s): 6.   Post op day(s): 4 Days Post-Op.   Interval History: Patient seen and examined, no acute events or new complaints overnight. Patient reports felt a little bit of nausea this morning but improved with the nausea medication.  She reports that she continue passing gas and had a small bowel movement this morning.  She denies vomiting.  She just ate her full liquid breakfast.  Reports that the wound continue to drain but less than yesterday.  Vital signs in last 24 hours: [min-max] current  Temp:  [97.8 F (36.6 C)-98 F (36.7 C)] 97.8 F (36.6 C) (09/26 0447) Pulse Rate:  [73-92] 73 (09/26 0823) Resp:  [19-20] 20 (09/26 0447) BP: (129-170)/(71-94) 157/94 (09/26 0447) SpO2:  [93 %-99 %] 94 % (09/26 0823)     Height: 5\' 4"  (162.6 cm) Weight: 128.4 kg BMI (Calculated): 48.57   Physical Exam:  Constitutional: alert, cooperative and no distress  Respiratory: breathing non-labored at rest  Cardiovascular: Irregular rhythm  Gastrointestinal: soft, non-tender, and non-distended.  Wound is clean, no cellulitis, no purulent drainage.  There is serous drainage.  Labs:  CBC Latest Ref Rng & Units 09/17/2019 09/15/2019 09/14/2019  WBC 4.0 - 10.5 K/uL 10.4 6.3 9.9  Hemoglobin 12.0 - 15.0 g/dL 12.3 11.1(L) 11.3(L)  Hematocrit 36.0 - 46.0 % 37.9 34.2(L) 35.8(L)  Platelets 150 - 400 K/uL 384 290 273   CMP Latest Ref Rng & Units 09/17/2019 09/16/2019 09/15/2019  Glucose 70 - 99 mg/dL 100(H) 131(H) 152(H)  BUN 8 - 23 mg/dL 20 22 24(H)  Creatinine 0.44 - 1.00 mg/dL 0.93 0.84 0.81  Sodium 135 - 145 mmol/L 141 139 138  Potassium 3.5 - 5.1 mmol/L 4.8 4.4 4.1  Chloride 98 - 111 mmol/L 105 107 105  CO2 22 - 32 mmol/L 29 24 22   Calcium 8.9 - 10.3 mg/dL 9.0 8.7(L) 8.8(L)  Total Protein 6.5 - 8.1 g/dL - - -  Total Bilirubin 0.3 - 1.2 mg/dL - - -  Alkaline Phos 38 - 126 U/L - - -  AST 15 - 41 U/L - - -  ALT 0 - 44 U/L - - -    Imaging studies: No new  pertinent imaging studies   Assessment/Plan:  68 y.o.femalewith terminal ileum ischemias/p ileum and right colon resection, with second look and ileotransversostomy, complicated by pertinent comorbidities includingatrial fibrillation on anticoagulation, COPD, pulmonary hypertensin, sleep apnea and morbid obesity. Today continue passing gas.  Nausea control with nausea medication.  I will advance diet to full liquids and assess for toleration.  Pain is controlled.  Dressing change.  There is no sign of infection but continue with serous drainage.  There is no leukocytosis, no fever, no tachycardia and no sign of infection.  Patient agreed with waiting to get out of bed and ambulate today.  Appreciate hospitalist and cardiology management recommendations of her medical conditions.  I agreed to continue with IV antibiotic therapy.  Patient on full anticoagulation.   Arnold Long, MD

## 2019-09-17 NOTE — Progress Notes (Signed)
[place] at [place] NAME: Laura [name]    MR#:  [id number]  DATE OF BIRTH:  17-Aug-1951  SUBJECTIVE:  Patient still with nausea this morning.  Still on clear liquid diet. Had bowel movement yesterday  REVIEW OF SYSTEMS:    Review of Systems  Constitutional: Negative for fever, chills weight loss HENT: Negative for ear pain, nosebleeds, congestion, facial swelling, rhinorrhea, neck pain, neck stiffness and ear discharge.   Respiratory: Negative for cough, shortness of breath, wheezing  Cardiovascular: Negative for chest pain, palpitations and leg swelling.  Gastrointestinal: Negative for heartburn, abdominal pain, vomiting, diarrhea or consitpation ++nausea Genitourinary: Negative for dysuria, urgency, frequency, hematuria Musculoskeletal: Negative for back pain or joint pain Neurological: Negative for dizziness, seizures, syncope, focal weakness,  numbness and headaches.  Hematological: Does not bruise/bleed easily.  Psychiatric/Behavioral: Negative for hallucinations, confusion, dysphoric mood    Tolerating Diet: yes      DRUG ALLERGIES:   Allergies  Allergen Reactions  . Flecainide Shortness Of Breath  . Propafenone Shortness Of Breath and Swelling  . Rivaroxaban Other (See Comments)    VITALS:  Blood pressure (!) 157/94, pulse 73, temperature 97.8 F (36.6 C), temperature source Oral, resp. rate 20, height 5\' 4"  (1.626 m), weight 128.4 kg, SpO2 94 %.  PHYSICAL EXAMINATION:  Constitutional: Appears well-developed and well-nourished. No distress. HENT: Normocephalic. Marland Kitchen Oropharynx is clear and moist.  Eyes: Conjunctivae and EOM are normal. PERRLA, no scleral icterus.  Neck: Normal ROM. Neck supple. No JVD. No tracheal deviation. CVS: RRR, S1/S2 +, no murmurs, no gallops, no carotid bruit.  Pulmonary: Effort and breath sounds normal, no stridor, rhonchi, wheezes, rales.  Abdominal:dressing placed with decreased bowel sounds  and some discharge from dressing.  Musculoskeletal: Normal range of motion. No edema and no tenderness.  Neuro: Alert. CN 2-12 grossly intact. No focal deficits. Skin: Skin is warm and dry. No rash noted. Psychiatric: Normal mood and affect.      LABORATORY PANEL:   CBC Recent Labs  Lab 09/17/19 0459  WBC 10.4  HGB 12.3  HCT 37.9  PLT 384   ------------------------------------------------------------------------------------------------------------------  Chemistries  Recent Labs  Lab 09/12/19 0449  09/17/19 0459  NA 136   < > 141  K 4.3   < > 4.8  CL 107   < > 105  CO2 21*   < > 29  GLUCOSE 207*   < > 100*  BUN 18   < > 20  CREATININE 1.06*   < > 0.93  CALCIUM 8.2*   < > 9.0  MG 1.9  --   --   AST 21  --   --   ALT 17  --   --   ALKPHOS 55  --   --   BILITOT 1.0  --   --    < > = values in this interval not displayed.   ------------------------------------------------------------------------------------------------------------------  Cardiac Enzymes No results for input(s): TROPONINI in the last 168 hours. ------------------------------------------------------------------------------------------------------------------  RADIOLOGY:  Dg Abd 1 View  Result Date: 09/16/2019 CLINICAL DATA:  Abdominal distension.  Bowel resection this week. EXAM: ABDOMEN - 1 VIEW COMPARISON:  CT, 09/11/2019. FINDINGS: Mildly distended partly air-filled loops of small bowel noted in the central and right abdomen. Air is seen in a nondilated left transverse colon. There are new surgical staples across the anterior abdominal midline. IMPRESSION: 1. Mild distention of loops of partly air-filled small bowel. Suspect a postoperative adynamic ileus. Partial small  bowel obstruction is possible. Electronically Signed   By: Lajean Manes M.D.   On: 09/16/2019 14:52     ASSESSMENT AND PLAN:    68 year old female with chronic atrial fibrillation, COPD, OSA and terminal ileum ischemia status  post ileum and right colon resection/ileotransverse ostomy.  1. Ischemic colitis: Patient status post ileum and right colon resection/ileotransverse ostomy Management as per surgery  2.  Atrial fibrillation with RVR: Heart rate better controlled Appreciate cardiology consultation.  3.  Sleep apnea: Continue CPAP  Home with [facility]   Management plans discussed with the patient and she is in agreement.  CODE STATUS: full  TOTAL TIME TAKING CARE OF THIS PATIENT: 25 minutes.     POSSIBLE D/C 2 days, DEPENDING ON CLINICAL CONDITION.   Bettey Costa M.D on 09/17/2019 at 9:32 AM  Between 7am to 6pm - Pager - [phone] After 6pm go to [email or web address] - password [email or web address] [facility]  Office  [phone]  CC: Primary care physician; [name], MD  Note: This dictation was prepared with Dragon dictation along with smaller phrase technology. Any transcriptional errors that result from this process are unintentional.

## 2019-09-17 NOTE — Progress Notes (Signed)
Abdominal dry dressing changed x 3 during the shift. Passing gas. Ambulated x 3 as well during the shift.

## 2019-09-18 ENCOUNTER — Encounter: Payer: Self-pay | Admitting: Radiology

## 2019-09-18 ENCOUNTER — Inpatient Hospital Stay: Payer: BC Managed Care – PPO

## 2019-09-18 MED ORDER — IOHEXOL 9 MG/ML PO SOLN: 500.0000 mL | ORAL | Status: DC

## 2019-09-18 MED ORDER — PROCHLORPERAZINE EDISYLATE 10 MG/2ML IJ SOLN
10.0000 mg | Freq: Once | INTRAMUSCULAR | Status: AC
  Administered 2019-09-18: 10 mg via INTRAVENOUS
  Filled 2019-09-18: qty 2

## 2019-09-18 MED ORDER — IOHEXOL 9 MG/ML PO SOLN
500.0000 mL | ORAL | Status: AC
  Administered 2019-09-18 (×2): 500 mL via ORAL

## 2019-09-18 MED ORDER — ALPRAZOLAM 0.25 MG PO TABS
0.2500 mg | ORAL_TABLET | Freq: Two times a day (BID) | ORAL | Status: DC
  Administered 2019-09-18 – 2019-09-20 (×5): 0.25 mg via ORAL
  Filled 2019-09-18 (×5): qty 1

## 2019-09-18 MED ORDER — IOHEXOL 300 MG/ML  SOLN
125.0000 mL | Freq: Once | INTRAMUSCULAR | Status: AC | PRN
  Administered 2019-09-18: 125 mL via INTRAVENOUS

## 2019-09-18 NOTE — Progress Notes (Signed)
[place] at [place] NAME: Laura [name]    MR#:  [id number]  DATE OF BIRTH:  Apr 16, 1951  SUBJECTIVE:  Patient with decreased nausea this morning.  Had large bowel movement.  Walked around yesterday.  Still has serosanguineous drainage.  REVIEW OF SYSTEMS:    Review of Systems  Constitutional: Negative for fever, chills weight loss HENT: Negative for ear pain, nosebleeds, congestion, facial swelling, rhinorrhea, neck pain, neck stiffness and ear discharge.   Respiratory: Negative for cough, shortness of breath, wheezing  Cardiovascular: Negative for chest pain, palpitations and leg swelling.  Gastrointestinal: Negative for heartburn, abdominal pain, vomiting, diarrhea or consitpation Genitourinary: Negative for dysuria, urgency, frequency, hematuria Musculoskeletal: Negative for back pain or joint pain Neurological: Negative for dizziness, seizures, syncope, focal weakness,  numbness and headaches.  Hematological: Does not bruise/bleed easily.  Psychiatric/Behavioral: Negative for hallucinations, confusion, dysphoric mood    Tolerating Diet: yes      DRUG ALLERGIES:   Allergies  Allergen Reactions  . Flecainide Shortness Of Breath  . Propafenone Shortness Of Breath and Swelling  . Rivaroxaban Other (See Comments)    VITALS:  Blood pressure 121/71, pulse 75, temperature 97.9 F (36.6 C), temperature source Oral, resp. rate 20, height 5\' 4"  (1.626 m), weight 128.4 kg, SpO2 97 %.  PHYSICAL EXAMINATION:  Constitutional: Appears well-developed and well-nourished. No distress. HENT: Normocephalic. Marland Kitchen Oropharynx is clear and moist.  Eyes: Conjunctivae and EOM are normal. PERRLA, no scleral icterus.  Neck: Normal ROM. Neck supple. No JVD. No tracheal deviation. CVS: RRR, S1/S2 +, no murmurs, no gallops, no carotid bruit.  Pulmonary: Effort and breath sounds normal, no stridor, rhonchi, wheezes, rales.  Abdominal:dressing placed  With  drainage serosanguinous from dressing.  Musculoskeletal: Normal range of motion. No edema and no tenderness.  Neuro: Alert. CN 2-12 grossly intact. No focal deficits. Skin: Skin is warm and dry. No rash noted.  Bilateral tawny colored LE Psychiatric: Normal mood and affect.      LABORATORY PANEL:   CBC Recent Labs  Lab 09/17/19 0459  WBC 10.4  HGB 12.3  HCT 37.9  PLT 384   ------------------------------------------------------------------------------------------------------------------  Chemistries  Recent Labs  Lab 09/12/19 0449  09/17/19 0459  NA 136   < > 141  K 4.3   < > 4.8  CL 107   < > 105  CO2 21*   < > 29  GLUCOSE 207*   < > 100*  BUN 18   < > 20  CREATININE 1.06*   < > 0.93  CALCIUM 8.2*   < > 9.0  MG 1.9  --   --   AST 21  --   --   ALT 17  --   --   ALKPHOS 55  --   --   BILITOT 1.0  --   --    < > = values in this interval not displayed.   ------------------------------------------------------------------------------------------------------------------  Cardiac Enzymes No results for input(s): TROPONINI in the last 168 hours. ------------------------------------------------------------------------------------------------------------------  RADIOLOGY:  Dg Abd 1 View  Result Date: 09/16/2019 CLINICAL DATA:  Abdominal distension.  Bowel resection this week. EXAM: ABDOMEN - 1 VIEW COMPARISON:  CT, 09/11/2019. FINDINGS: Mildly distended partly air-filled loops of small bowel noted in the central and right abdomen. Air is seen in a nondilated left transverse colon. There are new surgical staples across the anterior abdominal midline. IMPRESSION: 1. Mild distention of loops of partly air-filled small bowel. Suspect a postoperative  adynamic ileus. Partial small bowel obstruction is possible. Electronically Signed   By: Lajean Manes M.D.   On: 09/16/2019 14:52     ASSESSMENT AND PLAN:    68 year old female with chronic atrial fibrillation, COPD, OSA and  terminal ileum ischemia status post ileum and right colon resection/ileotransverse ostomy.  1. Ischemic colitis: Patient status post ileum and right colon resection/ileotransverse ostomy Management as per surgery Continue Zosyn  2.  Atrial fibrillation with RVR: Heart rate better controlled Appreciate cardiology consultation. Continue Diltiazem  3.  Sleep apnea: Continue CPAP at night  Home with HHC at discharge   Management plans discussed with the patient and she is in agreement.  CODE STATUS: full  TOTAL TIME TAKING CARE OF THIS PATIENT: 24 minutes.     POSSIBLE D/C 2 days, DEPENDING ON CLINICAL CONDITION.   Bettey Costa M.D on 09/18/2019 at 9:28 AM  Between 7am to 6pm - Pager - [phone] After 6pm go to [email or web address] - password [email or web address] [facility]  Office  [phone]  CC: Primary care physician; [name], MD  Note: This dictation was prepared with Dragon dictation along with smaller phrase technology. Any transcriptional errors that result from this process are unintentional.

## 2019-09-18 NOTE — Progress Notes (Signed)
[facility] Hospital Day(s): 7.   Post op day(s): 5 Days Post-Op.   Interval History: Patient seen and examined, no acute events or new complaints overnight. Patient reports feeling a little bit anxious today. She reports that she had a big bowel movement yesterday. Continue passing gas. Pain controlled. Mild intermittent nausea but no vomiting.   Vital signs in last 24 hours: [min-max] current  Temp:  [97.4 F (36.3 C)-98.5 F (36.9 C)] 98.5 F (36.9 C) (09/27 1229) Pulse Rate:  [49-75] 63 (09/27 1229) Resp:  [18-20] 18 (09/27 1229) BP: (114-142)/(57-78) 116/59 (09/27 1229) SpO2:  [94 %-97 %] 95 % (09/27 1229)     Height: 5\' 4"  (162.6 cm) Weight: 128.4 kg BMI (Calculated): 48.57   Physical Exam:  Constitutional: alert, cooperative and no distress  Respiratory: breathing non-labored at rest  Cardiovascular: regular rate and sinus rhythm  Gastrointestinal: soft, non-tender, and non-distended. Wound without erythema, no purulent drainage. There is serous drainage.   Labs:  CBC Latest Ref Rng & Units 09/17/2019 09/15/2019 09/14/2019  WBC 4.0 - 10.5 K/uL 10.4 6.3 9.9  Hemoglobin 12.0 - 15.0 g/dL 12.3 11.1(L) 11.3(L)  Hematocrit 36.0 - 46.0 % 37.9 34.2(L) 35.8(L)  Platelets 150 - 400 K/uL 384 290 273   CMP Latest Ref Rng & Units 09/17/2019 09/16/2019 09/15/2019  Glucose 70 - 99 mg/dL 100(H) 131(H) 152(H)  BUN 8 - 23 mg/dL 20 22 24(H)  Creatinine 0.44 - 1.00 mg/dL 0.93 0.84 0.81  Sodium 135 - 145 mmol/L 141 139 138  Potassium 3.5 - 5.1 mmol/L 4.8 4.4 4.1  Chloride 98 - 111 mmol/L 105 107 105  CO2 22 - 32 mmol/L 29 24 22   Calcium 8.9 - 10.3 mg/dL 9.0 8.7(L) 8.8(L)  Total Protein 6.5 - 8.1 g/dL - - -  Total Bilirubin 0.3 - 1.2 mg/dL - - -  Alkaline Phos 38 - 126 U/L - - -  AST 15 - 41 U/L - - -  ALT 0 - 44 U/L - - -    Imaging studies: No new pertinent imaging studies   Assessment/Plan:  68 y.o.femalewith terminal ileum ischemias/p ileum and right colon  resection, with second look and ileotransversostomy, complicated by pertinent comorbidities includingatrial fibrillation on anticoagulation, COPD, pulmonary hypertensin, sleep apnea and morbid obesity.  Continue recovering slowly. Did not liked the full liquid diet. If patient continue with adequate bowel function, will advance to soft diet tomorrow.   Due to persistent abundant serous drainage per wound, will order CT scan for evaluation of possible fascia dehiscence. Patient with a very thick abdominal wall due to morbid obesity difficult to assess fascia with physical exam. There is no sign of infection.   Agree with current medical management. Agree to continue anticoagulation, IV antibiotic therapy.   Patient ambulating. Adequate pain control.   Arnold Long, MD

## 2019-09-18 NOTE — Progress Notes (Addendum)
Abdominal dressing changed three times since this morning. Dr. Windell Moment has seen the patient. No falls. Nausea this AM yet nausea resolved without the need of nausea medications. The patient has ambulated in the hallway. Required anxiety medication this AM. CT of abdomen performed as the patient continues to have moderate amount of drainage from around incision.

## 2019-09-18 NOTE — TOC Initial Note (Addendum)
Transition of Care [facility]) - Initial/Assessment Note    Patient Details  Name: Laura [name] MRN: [id number] Date of Birth: 03-16-1951  [facility]) CM/SW Contact:    Latanya Maudlin, RN Phone Number: 09/18/2019, 12:58 PM  Clinical Narrative:  [facility] team consulted to assist with disposition. PT is recommending home health. Patient and spouse both feel that would be beneficial. They have no preference of agency but would like who works best with patients insurance and who has highest star rating per [employer].As of now [facility] and [facility] have not been able to accept referral since patients [employer] is primary payor and Medicare is secondary. Alvis Lemmings and [facility] still pending to see f they can accept referral. Patient has a rolling walker and cane at home. They are requesting a BSC prior to discharge. They're also inquiring about a bedside table and SCD machine but I have told them Im not sure if DME agencies carry those, we will check with companies. TOC team will continue to follow for disposition needs.    Update- [facility], [facility] also unable to accept referral. Helene Kelp with [facility] is checking to see if they can accept however they are limited on nursing and may not be able to service for that.               Expected Discharge Plan: [place] Barriers to Discharge: Continued Medical Work up   Patient Goals and CMS Choice   CMS Medicare.gov Compare Post Acute Care list provided to:: Patient Choice offered to / list presented to : Patient  Expected Discharge Plan and Services Expected Discharge Plan: [place]   Discharge Planning Services: CM Consult Post Acute Care Choice: Home Health, Durable Medical Equipment Living arrangements for the past 2 months: [place] Agency: [place], [place] Date [place]: 09/18/19 Time HH Agency  Contacted: [phone]    Prior Living Arrangements/Services Living arrangements for the past 2 months: Single Family Home Lives with:: Spouse                   Activities of Daily Living Home Assistive Devices/Equipment: CPAP, Oxygen, Cane (specify quad or straight), Nebulizer ADL Screening (condition at time of admission) Patient's cognitive ability adequate to safely complete daily activities?: Yes Is the patient deaf or have difficulty hearing?: No Does the patient have difficulty seeing, even when wearing glasses/contacts?: Yes Does the patient have difficulty concentrating, remembering, or making decisions?: No Patient able to express need for assistance with ADLs?: Yes Does the patient have difficulty dressing or bathing?: No Independently performs ADLs?: Yes (appropriate for developmental age) Does the patient have difficulty walking or climbing stairs?: Yes Weakness of Legs: Both Weakness of Arms/Hands: Both  Permission Sought/Granted                  Emotional Assessment              Admission diagnosis:  Enteritis [K52.9] Atrial fibrillation with rapid ventricular response ([place]) [I48.91] Patient Active Problem List   Diagnosis Date Noted  . Intestinal ischemia ([place])   . COPD with acute exacerbation ([place])   . Atrial fibrillation with rapid ventricular response ([place]) 09/11/2019  . Enteritis   . Peripheral edema 07/17/2019  .  Atherosclerosis of both carotid arteries 07/17/2019  . Varicose veins of right lower extremity with inflammation 06/08/2019  . Gastroesophageal reflux disease with esophagitis 05/08/2019  . Fall at home, initial encounter 03/16/2019  . Contusion of right lower leg 03/16/2019  . Flu-like symptoms 02/03/2019  . Nausea 02/03/2019  . Suprapatellar bursitis of right knee 01/23/2019  . Pain in right leg 01/23/2019  . Cellulitis of right leg 01/23/2019  . Chronic venous stasis dermatitis of right lower extremity 01/23/2019  .  Oropharyngeal candidiasis 01/23/2019  . Cellulitis of head except face 12/23/2018  . Screening for breast cancer 08/15/2018  . Chronic bronchitis with acute exacerbation ([place]) 08/15/2018  . Vitamin D deficiency 08/15/2018  . Need for vaccination against Streptococcus pneumoniae using pneumococcal conjugate vaccine 13 08/15/2018  . Obstructive chronic bronchitis without exacerbation ([place]) 07/14/2018  . Body mass index (BMI) 45.0-49.9, adult ([place]) 03/17/2018  . Asthma 02/25/2018  . Generalized anxiety disorder 12/16/2017  . Essential hypertension 12/16/2017  . Chronic respiratory failure with hypoxia ([place]) 12/16/2017  . Long term (current) use of anticoagulants 12/16/2017  . Chronic atrial fibrillation 12/16/2017  . Other specified functional intestinal disorders 12/16/2017  . Gastro-esophageal reflux disease without esophagitis 12/16/2017  . Obstructive sleep apnea of adult 12/16/2017  . Allergic rhinitis due to animal (cat) (dog) hair and dander 12/16/2017  . Allergic rhinitis due to pollen 12/16/2017  . Severe persistent asthma without complication [id number]  . Cough 12/16/2017  . SOB (shortness of breath) 12/16/2017  . Superficial thrombophlebitis of right leg 11/17/2016  . Pain in limb 11/17/2016  . Chronic venous insufficiency 11/17/2016  . Swelling of limb 11/17/2016  . Cellulitis of buttock   . Cellulitis of right axilla   . Sepsis ([place]) 06/28/2016  . Cellulitis and abscess 06/28/2016  . Hypokalemia 06/28/2016  . Acute renal insufficiency 06/28/2016  . Hyperglycemia 06/28/2016  . Leukocytosis 06/28/2016  . Hypoxia 06/28/2016  . Collagenous colitis 05/13/2016   PCP:  [name], MD Pharmacy:   [id number] - [place], [place] [place] [address] Phone: [phone] Fax: [phone]     Social Determinants of Health (SDOH) Interventions    Readmission Risk Interventions Readmission Risk Prevention Plan 09/18/2019   Medication Screening Complete  Transportation Screening Complete  Some recent data might be hidden

## 2019-09-18 NOTE — Progress Notes (Signed)
Abdominal dressing changed this morning by RN as it was saturated and leaking to the floor.

## 2019-09-18 NOTE — Progress Notes (Signed)
MD notified: The patient takes anxiety medication at home. She is starting to have an axiety attack can you order xanax 0.25 mg twice daily.

## 2019-09-19 ENCOUNTER — Other Ambulatory Visit: Payer: Self-pay

## 2019-09-19 DIAGNOSIS — J4550 Severe persistent asthma, uncomplicated: Secondary | ICD-10-CM

## 2019-09-19 MED ORDER — PREDNISONE 10 MG PO TABS: 10.0000 mg | ORAL_TABLET | Freq: Every day | ORAL | 5 refills | Status: DC

## 2019-09-19 MED ORDER — ADULT MULTIVITAMIN W/MINERALS CH
1.0000 | ORAL_TABLET | Freq: Every day | ORAL | Status: DC
  Administered 2019-09-19 – 2019-09-20 (×2): 1 via ORAL
  Filled 2019-09-19 (×2): qty 1

## 2019-09-19 MED ORDER — ENSURE MAX PROTEIN PO LIQD
11.0000 [oz_av] | Freq: Two times a day (BID) | ORAL | Status: DC
  Administered 2019-09-19: 14:00:00 11 [oz_av] via ORAL
  Filled 2019-09-19: qty 330

## 2019-09-19 NOTE — Progress Notes (Signed)
[place] at [place] NAME: Laura [name]    MR#:  [id number]  DATE OF BIRTH:  [birth year]  SUBJECTIVE:  Doing well this am No issues getting ready for d/c this weel  REVIEW OF SYSTEMS:    Review of Systems  Constitutional: Negative for fever, chills weight loss HENT: Negative for ear pain, nosebleeds, congestion, facial swelling, rhinorrhea, neck pain, neck stiffness and ear discharge.   Respiratory: Negative for cough, shortness of breath, wheezing  Cardiovascular: Negative for chest pain, palpitations and leg swelling.  Gastrointestinal: Negative for heartburn, abdominal pain, vomiting, diarrhea or consitpation  Genitourinary: Negative for dysuria, urgency, frequency, hematuria Musculoskeletal: Negative for back pain or joint pain Neurological: Negative for dizziness, seizures, syncope, focal weakness,  numbness and headaches.  Hematological: Does not bruise/bleed easily.  Psychiatric/Behavioral: Negative for hallucinations, confusion, dysphoric mood    Tolerating Diet: yes      DRUG ALLERGIES:   Allergies  Allergen Reactions  . Flecainide Shortness Of Breath  . Propafenone Shortness Of Breath and Swelling  . Rivaroxaban Other (See Comments)    VITALS:  Blood pressure 119/69, pulse 95, temperature 98.7 F (37.1 C), temperature source Oral, resp. rate 20, height 5\' 4"  (1.626 m), weight 128.4 kg, SpO2 99 %.  PHYSICAL EXAMINATION:  Constitutional: Appears well-developed and well-nourished. No distress. HENT: Normocephalic. Marland Kitchen Oropharynx is clear and moist.  Eyes: Conjunctivae and EOM are normal. PERRLA, no scleral icterus.  Neck: Normal ROM. Neck supple. No JVD. No tracheal deviation. CVS: RRR, S1/S2 +, no murmurs, no gallops, no carotid bruit.  Pulmonary: Effort and breath sounds normal, no stridor, rhonchi, wheezes, rales.  Abdominal:dressing placed    Musculoskeletal: Normal range of motion. No edema and no tenderness.   Neuro: Alert. CN 2-12 grossly intact. No focal deficits. Skin: Skin is warm and dry. No rash noted.  Bilateral tawny colored LE Psychiatric: Normal mood and affect.      LABORATORY PANEL:   CBC Recent Labs  Lab 09/17/19 0459  WBC 10.4  HGB 12.3  HCT 37.9  PLT 384   ------------------------------------------------------------------------------------------------------------------  Chemistries  Recent Labs  Lab 09/17/19 0459  NA 141  K 4.8  CL 105  CO2 29  GLUCOSE 100*  BUN 20  CREATININE 0.93  CALCIUM 9.0   ------------------------------------------------------------------------------------------------------------------  Cardiac Enzymes No results for input(s): TROPONINI in the last 168 hours. ------------------------------------------------------------------------------------------------------------------  RADIOLOGY:  Ct Abdomen Pelvis W Contrast  Result Date: 09/18/2019 CLINICAL DATA:  Abdominal distension evaluate for fascial dehiscence. Terminal ileum ischemia status post right colectomy and distal small bowel resection. EXAM: CT ABDOMEN AND PELVIS WITH CONTRAST TECHNIQUE: Multidetector CT imaging of the abdomen and pelvis was performed using the standard protocol following bolus administration of intravenous contrast. CONTRAST:  128mL OMNIPAQUE IOHEXOL 300 MG/ML  SOLN COMPARISON:  09/11/2019 FINDINGS: Lower chest: No acute abnormality. Hepatobiliary: Focal area of fatty deposition within medial segment of left lobe of liver noted. Unchanged too small to characterize low-density foci within the liver, likely small cysts. Gallbladder appears normal. No biliary dilatation. Pancreas: Unremarkable. No pancreatic ductal dilatation or surrounding inflammatory changes. Spleen: Normal in size without focal abnormality. Adrenals/Urinary Tract: Normal adrenal glands. Normal appearance of both kidneys. Urinary bladder appears normal. Stomach/Bowel: Stomach is unremarkable.  Postoperative change from distal small bowel resection and right hemicolectomy. Enterocolonic anastomosis identified within the central abdomen. No complications identified. Mild increase caliber of the small bowel loops which measure up to 2.7 cm. Small bowel air-fluid levels are noted.  No bowel wall thickening, inflammation or pathologic dilatation identified. Distal colonic diverticula without acute inflammation. Vascular/Lymphatic: Aortic atherosclerosis. No aneurysm. No abdominopelvic adenopathy Reproductive: Uterus and bilateral adnexa are unremarkable. Other: No free fluid or fluid collections. No significant pneumoperitoneum. Midline ventral abdominal wall incision site is identified. No complications identified associated with the incision site. Musculoskeletal: Thoracolumbar degenerative disc disease. IMPRESSION: 1. Status post distal small bowel resection with right hemicolectomy and enterocolonic anastomosis. No complicating features identified at this time. 2. Mild increase caliber of the small bowel loops favored to represent postoperative ileus. 3. Intact ventral midline incision site without specific findings to suggest wound dehiscence, hematoma or abscess. 4.  Aortic Atherosclerosis (ICD10-I70.0). Electronically Signed   By: Kerby Moors M.D.   On: 09/18/2019 20:27     ASSESSMENT AND PLAN:    68 year old female with chronic atrial fibrillation, COPD, OSA and terminal ileum ischemia status post ileum and right colon resection/ileotransverse ostomy.  1. Ischemic colitis: Patient is status post ileum and right colon resection with ileotransverseostomy She is recovering well with good bowel movements.  2.  Atrial fibrillation with RVR: Her heart rates have improved. Continue Diltiazem  3.  Sleep apnea: She will continue CPAP at night  Home with [facility] at discharge   Management plans discussed with the patient and she is in agreement.  CODE STATUS: full  TOTAL TIME TAKING CARE OF  THIS PATIENT: 24 minutes.     POSSIBLE D/C tomorrow DEPENDING ON CLINICAL CONDITION.   Bettey Costa M.D on 09/19/2019 at 9:53 AM  Between 7am to 6pm - Pager - [phone] After 6pm go to [email or web address] - password [email or web address] [facility]  Office  [phone]  CC: Primary care physician; [name], MD  Note: This dictation was prepared with Dragon dictation along with smaller phrase technology. Any transcriptional errors that result from this process are unintentional.

## 2019-09-19 NOTE — TOC Progression Note (Signed)
Transition of Care [facility]) - Progression Note    Patient Details  Name: Laura [name] MRN: [id number] Date of Birth: 21-Jan-1951  [facility]) CM/SW Contact  Beverly Sessions, RN Phone Number: 09/19/2019, 5:03 PM  Clinical Narrative:    Update:  Corene Cornea with [facility] has accepted home health referral    Expected Discharge Plan: [place] Barriers to Discharge: Continued Medical Work up  Expected Discharge Plan and Services Expected Discharge Plan: [place]   Discharge Planning Services: CM Consult Post Acute Care Choice: Home Health, Durable Medical Equipment Living arrangements for the past 2 months: [place] Agency: [place], [place] Date [place]: 09/18/19 Time [place]: 27     Social Determinants of Health (SDOH) Interventions    Readmission Risk Interventions Readmission Risk Prevention Plan 09/18/2019  Medication Screening Complete  Transportation Screening Complete  Some recent data might be hidden

## 2019-09-19 NOTE — Plan of Care (Signed)
Nutrition Education Note  RD consulted for nutrition education regarding a low residue diet   68 y.o. female with terminal ileum ischemia s/p ileum and right colon resection, with second look and ileotransversostomy, complicated by pertinent comorbidities including atrial fibrillation on anticoagulation, COPD, pulmonary hypertensin, sleep apnea and morbid obesity.  Resected: large bowel segment including cecum, distal ascending colon with hepatic flexure and proximal transverse colon segment 36 cm in length and distal ileum segment 42 cm in length including Ileocecal valve   Met with pt in room today. Pt reports good appetite and oral intake; pt eating 100% of meals. Spoke with patient today regarding nutrition therapy needed for post op healing. Recommend a low fiber diet that is high in protein and a daily MVI for the next 6 weeks following surgery. Recommended use of protein supplements if needed to reach a goal of at least 100g/day protein. Spoke with patient regarding the portions of large and small bowel that was resected. Spoke with pt regarding the need to frequently monitor B12 labs and replace with parenteral methods as needed. Spoke with patient regarding decreased water absorption and the importance of making sure she stays hydrated. Notified pt of possible loose stools and discussed the possibility of bile salt diarrhea and recommended notifying MD if diarrhea last more than 12 weeks.   RD provided "Low Fiber Nutrition Therapy" handout from the Academy of [employer]etics. Reviewed patient's dietary recall. Provided examples on ways to decrease fiber intake in the diet such as avoiding whole grains, seeds, nuts, raw vegetables,  fruits with skin, and connective tissue of meats. Advised patient to avoid sugar, artificial sweeteners, acidic/spicy foods, and caffeine. Discussed with patient the importance of having adequate amounts of lean protein. Gave patient examples of meals and menu  planning.   Teach back method used.  Expect good compliance.  Body mass index is 48.59 kg/m. Pt meets criteria for morbid obesity based on current BMI.  Current diet order is GI soft, patient is consuming approximately 100% of meals at this time. Labs and medications reviewed.   RD will add Ensure Max protein supplement BID and MVI daily   No further nutrition interventions warranted at this time. RD contact information provided. If additional nutrition issues arise, please re-consult RD.  Casey Campbell MS, RD, LDN Pager #- [phone] Office#- [phone] After Hours Pager: [phone]  

## 2019-09-19 NOTE — Progress Notes (Signed)
[facility] Day(s): 8.   Post op day(s): 6 Days Post-Op.   Interval History: Patient seen and examined, no acute events or new complaints overnight. Patient reports feeling better today.  She reports she had multiple small bowel movement yesterday.  She reported to continue passing gas.  I ordered a CT scan of the abdominal pelvis yesterday for evaluation of the fascia integrity due to the persistent amount of serous drainage.  CT scan of the abdomen and pelvis shows an intact fascia without any sign of intra-abdominal complications.  Patient currently tolerating clear full liquid diet.  Denies any nausea or vomiting.  The nurse reported that she did not change the dressing last night because it had remained dry.  Vital signs in last 24 hours: [min-max] current  Temp:  [97.9 F (36.6 C)-98.5 F (36.9 C)] 98.1 F (36.7 C) (09/28 0449) Pulse Rate:  [63-89] 80 (09/28 0449) Resp:  [18-20] 20 (09/28 0449) BP: (116-148)/(59-89) 116/80 (09/28 0449) SpO2:  [92 %-99 %] 92 % (09/28 0449)     Height: 5\' 4"  (162.6 cm) Weight: 128.4 kg BMI (Calculated): 48.57   Physical Exam:  Constitutional: alert, cooperative and no distress  Respiratory: breathing non-labored at rest  Cardiovascular: regular rate and sinus rhythm  Gastrointestinal: soft, non-tender, and non-distended.  Small amount of serous fluid in the dressing.  No erythema.  No fluid collection.  Labs:  CBC Latest Ref Rng & Units 09/17/2019 09/15/2019 09/14/2019  WBC 4.0 - 10.5 K/uL 10.4 6.3 9.9  Hemoglobin 12.0 - 15.0 g/dL 12.3 11.1(L) 11.3(L)  Hematocrit 36.0 - 46.0 % 37.9 34.2(L) 35.8(L)  Platelets 150 - 400 K/uL 384 290 273   CMP Latest Ref Rng & Units 09/17/2019 09/16/2019 09/15/2019  Glucose 70 - 99 mg/dL 100(H) 131(H) 152(H)  BUN 8 - 23 mg/dL 20 22 24(H)  Creatinine 0.44 - 1.00 mg/dL 0.93 0.84 0.81  Sodium 135 - 145 mmol/L 141 139 138  Potassium 3.5 - 5.1 mmol/L 4.8 4.4 4.1  Chloride 98 - 111 mmol/L 105 107  105  CO2 22 - 32 mmol/L 29 24 22   Calcium 8.9 - 10.3 mg/dL 9.0 8.7(L) 8.8(L)  Total Protein 6.5 - 8.1 g/dL - - -  Total Bilirubin 0.3 - 1.2 mg/dL - - -  Alkaline Phos 38 - 126 U/L - - -  AST 15 - 41 U/L - - -  ALT 0 - 44 U/L - - -    Imaging studies: CT scan of the abdominal pelvis shows no sign of intra-abdominal collection.  The fascia is intact.  There is no sign of intra-abdominal complication.   Assessment/Plan:  68 y.o.femalewith terminal ileum ischemias/p ileum and right colon resection, with second look and ileotransversostomy, complicated by pertinent comorbidities includingatrial fibrillation on anticoagulation, COPD, pulmonary hypertensin, sleep apnea and morbid obesity. Patient recovering slowly.  Steadily improving with persistent bowel movement and flatus.  CT scan ruled out the size of the fascia.  Drainage consistent with a wound seroma without any sign of infection.  He received decreasing slowly in the amount of drainage per day.  Last night the dressing did not need to be changed and this morning it was mostly dry with a small amount of fluid in the dressing.  Since she is tolerating diet and there is no sign of complication, will advance diet to soft diet.  At this point I think that patient can be started on discharge planning for possible discharge in the next 24 to  48 hours from the surgical standpoint.  Appreciate hospitalist management of medical conditions.  Agree with current management.  I encouraged the patient to ambulate today.  Will assess soft diet toleration today.  Arnold Long, MD

## 2019-09-19 NOTE — Progress Notes (Signed)
MD notified: The patient was working with PT and got dizzy and nauseous. Heart rate with PT  ranged from 80-130's quickly. Orthostatic vitals obtained by RN. Laying BP 124/57, HR 107; Sitting 119/81, HR 44; standing 111/81, HR 90, Repeated standing 136/92 HR 83. I think her A-fib is starting to act up again. Wound you like to order telemonitor, EKG or any other recommendations you would want to order.

## 2019-09-19 NOTE — Progress Notes (Signed)
Physical Therapy Treatment Patient Details Name: Laura [name] MRN: [id number] DOB: 04-Jul-1951 Today's Date: 09/19/2019    History of Present Illness Pt is a 68 y.o. female presenting to hospital 9/20 with abdominal pain, N/V, and diarrhea s/p fall 9/19; pt also noted to be in a-fib with RVR.  Pt admitted for enteritis with concern for ischemic bowel, a-fib with RVR, and htn.  Pt s/p 9/21 diagnostic laparoscopy, laparoscopic assisted small bowel and large bowel resection, and negative pressure dressing placement; pt also s/p 9/21 aortogram and selective angiogram of celiac artery, SMA, and IMA.  9/22 s/p reopening of recent laparotomy, ileum to transverse colon anastomosis, delayed abdominal wall closure, and implantation of negative pressure dressing.  PMH includes h/o a-fib on Eliquis, asthma, htn, OSA, pulmonary htn, COPD, and PAD; use of O2 bedtime.    PT Comments    Pt agreeable to PT; reports 6/10 pain in abdomen, low back and RLE. Pt notes she will request medication after therapy. Pt up in chair on arrival and agreeable to stand/walk activities. Initial HR 65 bpm; with initial stand, HR increased to high 130's quickly with onset of dizziness and nausea. Seated rest with HR decreased to 80 bpm and symptoms improving. Stand attempts 2 additional times tolerating static stand with HR WNL; however, with addition of 10 march in place using rolling walker, HR again quickly increases to 138-142 bpm. Seated rest in between attempts. No further mobility attempts. Pt requires Min a for STS transfers. Nursing notified via secure messaging on EPIC. Continue PT to progress endurance, strength to improve functional mobility and progress to ambulation. Pt also has 3 STE home with B rails.   Follow Up Recommendations  Home health PT;Other (comment)(may need SNF if unable to ambulate/stair climb)     Equipment Recommendations  Rolling walker with 5" wheels;3in1 (PT)    Recommendations for Other Services        Precautions / Restrictions Precautions Precautions: Fall Precaution Comments: long abdominal incision Restrictions Weight Bearing Restrictions: No    Mobility  Bed Mobility               General bed mobility comments: not tested; up in chair  Transfers Overall transfer level: Needs assistance Equipment used: Rolling walker (2 wheeled) Transfers: Sit to/from Stand Sit to Stand: Min assist         General transfer comment: performed 3x to/from recliner. Initial stand with increase in HR to high 130's  Ambulation/Gait             General Gait Details: Unable due to increased HR and mild dizziness/nausea with each stand   Stairs             Wheelchair Mobility    Modified Rankin (Stroke Patients Only)       Balance Overall balance assessment: Needs assistance Sitting-balance support: Bilateral upper extremity supported;Feet supported Sitting balance-Leahy Scale: Good     Standing balance support: Bilateral upper extremity supported Standing balance-Leahy Scale: Fair                              Cognition Arousal/Alertness: Awake/alert Behavior During Therapy: WFL for tasks assessed/performed Overall Cognitive Status: Within Functional Limits for tasks assessed                                        Exercises  General Exercises - Lower Extremity Hip Flexion/Marching: AROM;Strengthening;Both;10 reps(2 sets with seated rest between; HR increases to 138-141 bpm) Other Exercises Other Exercises: static stand tolerance    General Comments        Pertinent Vitals/Pain Pain Assessment: 0-10 Pain Score: 6  Pain Location: low back, abdomen and RLE Pain Descriptors / Indicators: Aching;Constant;Discomfort;Grimacing    Home Living                      Prior Function            PT Goals (current goals can now be found in the care plan section) Progress towards PT goals: Progressing toward  goals(slowly)    Frequency    Min 2X/week      PT Plan Current plan remains appropriate    Co-evaluation              AM-PAC PT "6 Clicks" Mobility   Outcome Measure  Help needed turning from your back to your side while in a flat bed without using bedrails?: A Little Help needed moving from lying on your back to sitting on the side of a flat bed without using bedrails?: A Little Help needed moving to and from a bed to a chair (including a wheelchair)?: A Little Help needed standing up from a chair using your arms (e.g., wheelchair or bedside chair)?: A Little Help needed to walk in hospital room?: A Little Help needed climbing 3-5 steps with a railing? : A Lot 6 Click Score: 17    End of Session Equipment Utilized During Treatment: Gait belt Activity Tolerance: Patient limited by fatigue;Patient limited by pain;Other (comment)(increased HR with minimal exertion) Patient left: in chair;with call bell/phone within reach;with chair alarm set Nurse Communication: Other (comment)(HR with minimal activity) PT Visit Diagnosis: Unsteadiness on feet (R26.81);Other abnormalities of gait and mobility (R26.89);Muscle weakness (generalized) (M62.81);History of falling (Z91.81)     Time: [id number] PT Time Calculation (min) (ACUTE ONLY): 26 min  Charges:  $Therapeutic Exercise: 8-22 mins $Therapeutic Activity: 8-22 mins                      Larae Grooms, PTA 09/19/2019, 3:41 PM

## 2019-09-19 NOTE — Progress Notes (Signed)
The patient has been nauseous since soft diet has been started. Nausea medication provided. Voiding. Pt had a bowel movement. No falls. Abdominal dressing changed 3 times due to saturation.

## 2019-09-19 NOTE — Discharge Instructions (Signed)
[facility] Stay  Proper nutrition can help your body recover from illness and injury.   Foods and beverages high in protein, vitamins, and minerals help rebuild muscle loss, promote healing, & reduce fall risk.   In addition to eating healthy foods, a nutrition shake is an easy, delicious way to get the nutrition you need during and after your hospital stay. Aim for at least 100g of protein daily for the next 6 weeks   You have had your terminal ileum and portions of your large bowel removed. These areas are responsible for absorption of B12, water and bile salts. As previously discussed, you will need to have your B12 labs monitored 2-3 times a year and receive B12 injections if needed. You will also need to drink plenty of water and make sure that you stay hydrated.   Please follow a low fiber diet for the next 6 weeks unless otherwise instructed by you doctor. You can slowly begin to add fiber back into your diet as tolerated after 6 weeks. Please notify your doctor if you after diarrhea for more than 12 weeks as you are at risk for bile salt diarrhea.

## 2019-09-20 LAB — CBC
HCT: 40.8 % (ref 36.0–46.0)
Hemoglobin: 13.3 g/dL (ref 12.0–15.0)
MCH: 29.9 pg (ref 26.0–34.0)
MCHC: 32.6 g/dL (ref 30.0–36.0)
MCV: 91.7 fL (ref 80.0–100.0)
Platelets: 397 10*3/uL (ref 150–400)
RBC: 4.45 MIL/uL (ref 3.87–5.11)
RDW: 14.1 % (ref 11.5–15.5)
WBC: 10.5 10*3/uL (ref 4.0–10.5)
nRBC: 0 % (ref 0.0–0.2)

## 2019-09-20 MED ORDER — ONDANSETRON HCL 4 MG PO TABS: 4.0000 mg | ORAL_TABLET | Freq: Three times a day (TID) | ORAL | 0 refills | Status: DC | PRN

## 2019-09-20 MED ORDER — HYDROCODONE-ACETAMINOPHEN 5-325 MG PO TABS: 1.0000 | ORAL_TABLET | ORAL | 0 refills | Status: AC | PRN

## 2019-09-20 NOTE — Care Management (Signed)
Patient has severe weakness and pain which requires patient to be positioned in ways not feasible with a normal bed. Pain and weakness frequently  requires frequent changes in body position which cannot be achieved with a normal bed

## 2019-09-20 NOTE — Progress Notes (Signed)
[facility] Day(s): 9.   Post op day(s): 7 Days Post-Op.   Interval History: Patient seen and examined, no acute events or new complaints overnight. Patient reports having intermittent nausea but tolerating soft diet and having good bowel movements and passing gas.  Denies significant pain.  Denies fever or chills.  She reports that the drainage is still there but decreasing the amount every time.  Vital signs in last 24 hours: [min-max] current  Temp:  [97.6 F (36.4 C)-98.4 F (36.9 C)] 97.9 F (36.6 C) (09/29 1150) Pulse Rate:  [52-119] 65 (09/29 1150) Resp:  [18-20] 18 (09/29 1150) BP: (94-146)/(61-90) 130/90 (09/29 1150) SpO2:  [94 %-97 %] 97 % (09/29 1150)     Height: 5\' 4"  (162.6 cm) Weight: 128.4 kg BMI (Calculated): 48.57   Physical Exam:  Constitutional: alert, cooperative and no distress  Respiratory: breathing non-labored at rest  Cardiovascular: regular rate and sinus rhythm  Gastrointestinal: soft, non-tender, and non-distended.  Staple in place, no erythema, serous drainage, minimal.  Labs:  CBC Latest Ref Rng & Units 09/20/2019 09/17/2019 09/15/2019  WBC 4.0 - 10.5 K/uL 10.5 10.4 6.3  Hemoglobin 12.0 - 15.0 g/dL 13.3 12.3 11.1(L)  Hematocrit 36.0 - 46.0 % 40.8 37.9 34.2(L)  Platelets 150 - 400 K/uL 397 384 290   CMP Latest Ref Rng & Units 09/17/2019 09/16/2019 09/15/2019  Glucose 70 - 99 mg/dL 100(H) 131(H) 152(H)  BUN 8 - 23 mg/dL 20 22 24(H)  Creatinine 0.44 - 1.00 mg/dL 0.93 0.84 0.81  Sodium 135 - 145 mmol/L 141 139 138  Potassium 3.5 - 5.1 mmol/L 4.8 4.4 4.1  Chloride 98 - 111 mmol/L 105 107 105  CO2 22 - 32 mmol/L 29 24 22   Calcium 8.9 - 10.3 mg/dL 9.0 8.7(L) 8.8(L)  Total Protein 6.5 - 8.1 g/dL - - -  Total Bilirubin 0.3 - 1.2 mg/dL - - -  Alkaline Phos 38 - 126 U/L - - -  AST 15 - 41 U/L - - -  ALT 0 - 44 U/L - - -    Imaging studies: No new pertinent imaging studies   Assessment/Plan:  68 y.o.femalewith terminal ileum  ischemias/p ileum and right colon resection, with second look and ileotransversostomy, complicated by pertinent comorbidities includingatrial fibrillation on anticoagulation, COPD, pulmonary hypertensin, sleep apnea and morbid obesity. Patient recovering well.  Continue having regular bowel movement.  Continue tolerating soft diet.  Pain is controlled.  Serous drainage decreasing in the amount and control with a gauze.  Patient very concerned about coordinating the help that she needs at home.  From surgery standpoint all discharge planning is coordinated patient is stable to be discharged.  I will follow her in 1 week in my office.  Arnold Long, MD

## 2019-09-20 NOTE — Discharge Summary (Signed)
[facility] [place] NAME: Laura [name]    MR#:  [id number]  DATE OF BIRTH:  24-Jan-1951  DATE OF ADMISSION:  09/11/2019 ADMITTING PHYSICIAN: Otila Back, MD  DATE OF DISCHARGE: 09/20/2019  PRIMARY CARE PHYSICIAN: [name], MD    ADMISSION DIAGNOSIS:  Enteritis [K52.9] Atrial fibrillation with rapid ventricular response ([place]) [I48.91]  DISCHARGE DIAGNOSIS:  Active Problems:   Essential hypertension   Atrial fibrillation with rapid ventricular response ([place])   Intestinal ischemia (HCC)   COPD with acute exacerbation ([name])   SECONDARY DIAGNOSIS:   Past Medical History:  Diagnosis Date  . Asthma   . Chronic atrial fibrillation    a. diagnosed in 09/2016; b. failed flecainide and propafenone due to LE swelling and SOB, could not afford Multaq; c. CHADS2VASc => 3 (HTN, age x 1, female); d. on Eliquis  . GERD (gastroesophageal reflux disease)   . History of stress test    a. Lexiscan Myoview 10/2016: no evidence of ischemia, EF 53%  . Hypertension   . Obesity   . Obstructive sleep apnea   . Pulmonary hypertension ([place])   . Systolic dysfunction    a. TTE 10/2016: EF 50%, mild LVH, moderately dilated LA, moderate MR/TR, mild pulmonary hypertension    HOSPITAL COURSE:  68 year old female with chronic atrial fibrillation, COPD, OSA and terminal ileum ischemia status post ileum and right colon resection/ileotransverse ostomy.  1. Ischemic colitis: Patient is status post ileum and right colon resection (large bowel segment including cecum, distal ascending colon with hepatic flexure and proximal transverse colon segment 36 cm in length and distal ileum segment 42 cm in length including Ileocecal valve ) with ileotransverseostomy She has continued to show improvement. As per surgery she is stable to be discharged home.  2.  Atrial fibrillation with RVR: Her heart rates have improved. She will continue Diltiazem and Eliquis for stroke  prevention  3.  Sleep apnea: She will continue CPAP at night   DISCHARGE CONDITIONS AND DIET:   Stable for discharge fiber nutrition therapy  CONSULTS OBTAINED:  Treatment Team:  Herbert Pun, MD  DRUG ALLERGIES:   Allergies  Allergen Reactions  . Flecainide Shortness Of Breath  . Propafenone Shortness Of Breath and Swelling  . Rivaroxaban Other (See Comments)    DISCHARGE MEDICATIONS:   Allergies as of 09/20/2019      Reactions   Flecainide Shortness Of Breath   Propafenone Shortness Of Breath, Swelling   Rivaroxaban Other (See Comments)      Medication List    STOP taking these medications   ibuprofen 200 MG tablet Commonly known as: ADVIL   pantoprazole 40 MG tablet Commonly known as: PROTONIX   phentermine 37.5 MG tablet Commonly known as: ADIPEX-P     TAKE these medications   albuterol 108 (90 Base) MCG/ACT inhaler Commonly known as: VENTOLIN HFA Inhale 2 puffs into the lungs every 4 (four) hours as needed for wheezing or shortness of breath.   ALPRAZolam 0.25 MG tablet Commonly known as: XANAX Take 1 tablet (0.25 mg total) by mouth 2 (two) times daily.   apixaban 5 MG Tabs tablet Commonly known as: Eliquis Take 1 tablet (5 mg total) by mouth 2 (two) times daily. Notes to patient: 09/19/29   Dexilant 60 MG capsule Generic drug: dexlansoprazole Take 1 capsule (60 mg total) by mouth daily.   diltiazem 360 MG 24 hr capsule Commonly known as: CARDIZEM CD TAKE 1 CAPSULE (360 MG TOTAL) BY  MOUTH DAILY.   EPINEPHrine 0.3 mg/0.3 mL Soaj injection Commonly known as: EPI-PEN Inject 0.3 mg into the muscle as needed for anaphylaxis.   furosemide 40 MG tablet Commonly known as: LASIX Take 1 tablet (40 mg total) by mouth daily.   ipratropium-albuterol 0.5-2.5 (3) MG/3ML Soln Commonly known as: DUONEB Take 3 mLs by nebulization every 4 (four) hours as needed. What changed: reasons to take this   levalbuterol 0.31 MG/3ML nebulizer  solution Commonly known as: XOPENEX Take 3 mLs by nebulization every 4 (four) hours as needed. For wheezing/ and or shortness of breath.   losartan 25 MG tablet Commonly known as: COZAAR Take 1 tablet (25 mg total) by mouth daily.   montelukast 10 MG tablet Commonly known as: SINGULAIR TAKE 1 TABLET BY MOUTH EVERY DAY What changed: when to take this   Mucinex 600 MG 12 hr tablet Generic drug: guaiFENesin Take 600 mg by mouth 2 (two) times daily as needed for cough or to loosen phlegm.   multivitamin tablet Take 1 tablet by mouth daily.   nystatin 100000 UNIT/ML suspension Commonly known as: MYCOSTATIN Take 5 mLs (500,000 Units total) by mouth 4 (four) times daily. What changed:   when to take this  reasons to take this   ondansetron 8 MG disintegrating tablet Commonly known as: ZOFRAN-ODT Take 1 tablet (8 mg total) by mouth every 8 (eight) hours as needed for nausea or vomiting.   predniSONE 10 MG tablet Commonly known as: DELTASONE Take 1 tablet (10 mg total) by mouth daily with breakfast.   Probiotic-10 Caps Take 1 capsule by mouth daily.   spironolactone 25 MG tablet Commonly known as: ALDACTONE Take 1 tablet (25 mg total) by mouth 2 (two) times daily.   theophylline 300 MG 24 hr capsule Commonly known as: THEO-24 Take 1 capsule (300 mg total) by mouth daily.            Durable Medical Equipment  (From admission, onward)         Start     Ordered   09/20/19 1033  For home use only DME Bedside commode  Once    Question:  Patient needs a bedside commode to treat with the following condition  Answer:  Weakness   09/20/19 1033   09/20/19 1033  For home use only DME Hospital bed  Once    Question Answer Comment  Length of Need Lifetime   The above medical condition requires: Patient requires the ability to reposition frequently   Bed type Semi-electric   Support Surface: Gel Overlay      09/20/19 1033            Today   CHIEF COMPLAINT:    Patient doing well this morning.  Having bowel movements and passing flatulence   VITAL SIGNS:  Blood pressure (!) 146/61, pulse 74, temperature 97.8 F (36.6 C), temperature source Oral, resp. rate 20, height 5\' 4"  (1.626 m), weight 128.4 kg, SpO2 96 %.   REVIEW OF SYSTEMS:  Review of Systems  Constitutional: Negative.  Negative for chills, fever and malaise/fatigue.  HENT: Negative.  Negative for ear discharge, ear pain, hearing loss, nosebleeds and sore throat.   Eyes: Negative.  Negative for blurred vision and pain.  Respiratory: Negative.  Negative for cough, hemoptysis, shortness of breath and wheezing.   Cardiovascular: Negative.  Negative for chest pain, palpitations and leg swelling.  Gastrointestinal: Negative.  Negative for abdominal pain, blood in stool, diarrhea, nausea and vomiting.  Genitourinary: Negative.  Negative for dysuria.  Musculoskeletal: Negative.  Negative for back pain.  Skin: Negative.   Neurological: Negative for dizziness, tremors, speech change, focal weakness, seizures and headaches.  Endo/Heme/Allergies: Negative.  Does not bruise/bleed easily.  Psychiatric/Behavioral: Negative.  Negative for depression, hallucinations and suicidal ideas.     PHYSICAL EXAMINATION:  GENERAL:  68 y.o.-year-old patient lying in the bed with no acute distress.  NECK:  Supple, no jugular venous distention. No thyroid enlargement, no tenderness.  LUNGS: Normal breath sounds bilaterally, no wheezing, rales,rhonchi  No use of accessory muscles of respiration.  CARDIOVASCULAR: S1, S2 normal. No murmurs, rubs, or gallops.  ABDOMEN: Soft, non-tender, non-distended. Bowel sounds present. No organomegaly or mass. Bandage placed on abdomen without much draiage EXTREMITIES: No pedal edema, cyanosis, or clubbing.  PSYCHIATRIC: The patient is alert and oriented x 3.  SKIN: No obvious rash, lesion, or ulcer.   DATA REVIEW:   CBC Recent Labs  Lab 09/20/19 0457  WBC 10.5   HGB 13.3  HCT 40.8  PLT 397    Chemistries  Recent Labs  Lab 09/17/19 0459  NA 141  K 4.8  CL 105  CO2 29  GLUCOSE 100*  BUN 20  CREATININE 0.93  CALCIUM 9.0    Cardiac Enzymes No results for input(s): TROPONINI in the last 168 hours.  Microbiology Results  @MICRORSLT48 @  RADIOLOGY:  Ct Abdomen Pelvis W Contrast  Result Date: 09/18/2019 CLINICAL DATA:  Abdominal distension evaluate for fascial dehiscence. Terminal ileum ischemia status post right colectomy and distal small bowel resection. EXAM: CT ABDOMEN AND PELVIS WITH CONTRAST TECHNIQUE: Multidetector CT imaging of the abdomen and pelvis was performed using the standard protocol following bolus administration of intravenous contrast. CONTRAST:  140mL OMNIPAQUE IOHEXOL 300 MG/ML  SOLN COMPARISON:  09/11/2019 FINDINGS: Lower chest: No acute abnormality. Hepatobiliary: Focal area of fatty deposition within medial segment of left lobe of liver noted. Unchanged too small to characterize low-density foci within the liver, likely small cysts. Gallbladder appears normal. No biliary dilatation. Pancreas: Unremarkable. No pancreatic ductal dilatation or surrounding inflammatory changes. Spleen: Normal in size without focal abnormality. Adrenals/Urinary Tract: Normal adrenal glands. Normal appearance of both kidneys. Urinary bladder appears normal. Stomach/Bowel: Stomach is unremarkable. Postoperative change from distal small bowel resection and right hemicolectomy. Enterocolonic anastomosis identified within the central abdomen. No complications identified. Mild increase caliber of the small bowel loops which measure up to 2.7 cm. Small bowel air-fluid levels are noted. No bowel wall thickening, inflammation or pathologic dilatation identified. Distal colonic diverticula without acute inflammation. Vascular/Lymphatic: Aortic atherosclerosis. No aneurysm. No abdominopelvic adenopathy Reproductive: Uterus and bilateral adnexa are unremarkable.  Other: No free fluid or fluid collections. No significant pneumoperitoneum. Midline ventral abdominal wall incision site is identified. No complications identified associated with the incision site. Musculoskeletal: Thoracolumbar degenerative disc disease. IMPRESSION: 1. Status post distal small bowel resection with right hemicolectomy and enterocolonic anastomosis. No complicating features identified at this time. 2. Mild increase caliber of the small bowel loops favored to represent postoperative ileus. 3. Intact ventral midline incision site without specific findings to suggest wound dehiscence, hematoma or abscess. 4.  Aortic Atherosclerosis (ICD10-I70.0). Electronically Signed   By: Kerby Moors M.D.   On: 09/18/2019 20:27      Allergies as of 09/20/2019      Reactions   Flecainide Shortness Of Breath   Propafenone Shortness Of Breath, Swelling   Rivaroxaban Other (See Comments)      Medication List    STOP taking these medications  ibuprofen 200 MG tablet Commonly known as: ADVIL   pantoprazole 40 MG tablet Commonly known as: PROTONIX   phentermine 37.5 MG tablet Commonly known as: ADIPEX-P     TAKE these medications   albuterol 108 (90 Base) MCG/ACT inhaler Commonly known as: VENTOLIN HFA Inhale 2 puffs into the lungs every 4 (four) hours as needed for wheezing or shortness of breath.   ALPRAZolam 0.25 MG tablet Commonly known as: XANAX Take 1 tablet (0.25 mg total) by mouth 2 (two) times daily.   apixaban 5 MG Tabs tablet Commonly known as: Eliquis Take 1 tablet (5 mg total) by mouth 2 (two) times daily. Notes to patient: 09/19/29   Dexilant 60 MG capsule Generic drug: dexlansoprazole Take 1 capsule (60 mg total) by mouth daily.   diltiazem 360 MG 24 hr capsule Commonly known as: CARDIZEM CD TAKE 1 CAPSULE (360 MG TOTAL) BY MOUTH DAILY.   EPINEPHrine 0.3 mg/0.3 mL Soaj injection Commonly known as: EPI-PEN Inject 0.3 mg into the muscle as needed for  anaphylaxis.   furosemide 40 MG tablet Commonly known as: LASIX Take 1 tablet (40 mg total) by mouth daily.   ipratropium-albuterol 0.5-2.5 (3) MG/3ML Soln Commonly known as: DUONEB Take 3 mLs by nebulization every 4 (four) hours as needed. What changed: reasons to take this   levalbuterol 0.31 MG/3ML nebulizer solution Commonly known as: XOPENEX Take 3 mLs by nebulization every 4 (four) hours as needed. For wheezing/ and or shortness of breath.   losartan 25 MG tablet Commonly known as: COZAAR Take 1 tablet (25 mg total) by mouth daily.   montelukast 10 MG tablet Commonly known as: SINGULAIR TAKE 1 TABLET BY MOUTH EVERY DAY What changed: when to take this   Mucinex 600 MG 12 hr tablet Generic drug: guaiFENesin Take 600 mg by mouth 2 (two) times daily as needed for cough or to loosen phlegm.   multivitamin tablet Take 1 tablet by mouth daily.   nystatin 100000 UNIT/ML suspension Commonly known as: MYCOSTATIN Take 5 mLs (500,000 Units total) by mouth 4 (four) times daily. What changed:   when to take this  reasons to take this   ondansetron 8 MG disintegrating tablet Commonly known as: ZOFRAN-ODT Take 1 tablet (8 mg total) by mouth every 8 (eight) hours as needed for nausea or vomiting.   predniSONE 10 MG tablet Commonly known as: DELTASONE Take 1 tablet (10 mg total) by mouth daily with breakfast.   Probiotic-10 Caps Take 1 capsule by mouth daily.   spironolactone 25 MG tablet Commonly known as: ALDACTONE Take 1 tablet (25 mg total) by mouth 2 (two) times daily.   theophylline 300 MG 24 hr capsule Commonly known as: THEO-24 Take 1 capsule (300 mg total) by mouth daily.            Durable Medical Equipment  (From admission, onward)         Start     Ordered   09/20/19 1033  For home use only DME Bedside commode  Once    Question:  Patient needs a bedside commode to treat with the following condition  Answer:  Weakness   09/20/19 1033   09/20/19  1033  For home use only DME Hospital bed  Once    Question Answer Comment  Length of Need Lifetime   The above medical condition requires: Patient requires the ability to reposition frequently   Bed type Semi-electric   Support Surface: Gel Overlay      09/20/19 1033  Management plans discussed with the patient and she is in agreement. Stable for discharge home with [facility]  Patient should follow up with  Dr Peyton Najjar  CODE STATUS:     Code Status Orders  (From admission, onward)         Start     Ordered   09/11/19 1434  Full code  Continuous     09/11/19 1433        Code Status History    Date Active Date Inactive Code Status Order ID Comments User Context   06/28/2016 2112 07/01/2016 1456 Full Code [id number]  Theodoro Grist, MD Inpatient   Advance Care Planning Activity      TOTAL TIME TAKING CARE OF THIS PATIENT: 39 minutes.    Note: This dictation was prepared with Dragon dictation along with smaller phrase technology. Any transcriptional errors that result from this process are unintentional.  Bettey Costa M.D on 09/20/2019 at 10:53 AM  Between 7am to 6pm - Pager - [phone] After 6pm go to [email or web address] - password [email or web address] [facility]  Office  [phone]  CC: Primary care physician; [name], MD

## 2019-09-20 NOTE — Progress Notes (Signed)
Laura [name]  A and O x 4. VSS. Pt tolerating diet well. No complaints of pain or nausea. IV removed intact, prescriptions given. Pt voiced understanding of discharge instructions with no further questions. Pt discharged via wheelchair with RN.    Allergies as of 09/20/2019      Reactions   Flecainide Shortness Of Breath   Propafenone Shortness Of Breath, Swelling   Rivaroxaban Other (See Comments)      Medication List    STOP taking these medications   ibuprofen 200 MG tablet Commonly known as: ADVIL   pantoprazole 40 MG tablet Commonly known as: PROTONIX   phentermine 37.5 MG tablet Commonly known as: ADIPEX-P     TAKE these medications   albuterol 108 (90 Base) MCG/ACT inhaler Commonly known as: VENTOLIN HFA Inhale 2 puffs into the lungs every 4 (four) hours as needed for wheezing or shortness of breath.   ALPRAZolam 0.25 MG tablet Commonly known as: XANAX Take 1 tablet (0.25 mg total) by mouth 2 (two) times daily.   apixaban 5 MG Tabs tablet Commonly known as: Eliquis Take 1 tablet (5 mg total) by mouth 2 (two) times daily. Notes to patient: 09/19/29   Dexilant 60 MG capsule Generic drug: dexlansoprazole Take 1 capsule (60 mg total) by mouth daily.   diltiazem 360 MG 24 hr capsule Commonly known as: CARDIZEM CD TAKE 1 CAPSULE (360 MG TOTAL) BY MOUTH DAILY.   EPINEPHrine 0.3 mg/0.3 mL Soaj injection Commonly known as: EPI-PEN Inject 0.3 mg into the muscle as needed for anaphylaxis.   furosemide 40 MG tablet Commonly known as: LASIX Take 1 tablet (40 mg total) by mouth daily.   HYDROcodone-acetaminophen 5-325 MG tablet Commonly known as: Norco Take 1 tablet by mouth every 4 (four) hours as needed for up to 3 days for moderate pain.   ipratropium-albuterol 0.5-2.5 (3) MG/3ML Soln Commonly known as: DUONEB Take 3 mLs by nebulization every 4 (four) hours as needed. What changed: reasons to take this   levalbuterol 0.31 MG/3ML nebulizer solution Commonly  known as: XOPENEX Take 3 mLs by nebulization every 4 (four) hours as needed. For wheezing/ and or shortness of breath.   losartan 25 MG tablet Commonly known as: COZAAR Take 1 tablet (25 mg total) by mouth daily.   montelukast 10 MG tablet Commonly known as: SINGULAIR TAKE 1 TABLET BY MOUTH EVERY DAY What changed: when to take this   Mucinex 600 MG 12 hr tablet Generic drug: guaiFENesin Take 600 mg by mouth 2 (two) times daily as needed for cough or to loosen phlegm.   multivitamin tablet Take 1 tablet by mouth daily.   nystatin 100000 UNIT/ML suspension Commonly known as: MYCOSTATIN Take 5 mLs (500,000 Units total) by mouth 4 (four) times daily. What changed:   when to take this  reasons to take this   ondansetron 4 MG tablet Commonly known as: Zofran Take 1 tablet (4 mg total) by mouth every 8 (eight) hours as needed for nausea or vomiting.   ondansetron 8 MG disintegrating tablet Commonly known as: ZOFRAN-ODT Take 1 tablet (8 mg total) by mouth every 8 (eight) hours as needed for nausea or vomiting.   predniSONE 10 MG tablet Commonly known as: DELTASONE Take 1 tablet (10 mg total) by mouth daily with breakfast.   Probiotic-10 Caps Take 1 capsule by mouth daily.   spironolactone 25 MG tablet Commonly known as: ALDACTONE Take 1 tablet (25 mg total) by mouth 2 (two) times daily.   theophylline  300 MG 24 hr capsule Commonly known as: THEO-24 Take 1 capsule (300 mg total) by mouth daily.            Durable Medical Equipment  (From admission, onward)         Start     Ordered   09/20/19 1033  For home use only DME Bedside commode  Once    Question:  Patient needs a bedside commode to treat with the following condition  Answer:  Weakness   09/20/19 1033   09/20/19 1033  For home use only DME Hospital bed  Once    Question Answer Comment  Length of Need Lifetime   The above medical condition requires: Patient requires the ability to reposition frequently    Bed type Semi-electric   Support Surface: Gel Overlay      09/20/19 1033          Vitals:   09/20/19 0530 09/20/19 1150  BP: (!) 146/61 130/90  Pulse: 74 65  Resp: 20 18  Temp: 97.8 F (36.6 C) 97.9 F (36.6 C)  SpO2: 96% 97%    Francesco Sor

## 2019-09-21 NOTE — Care Management (Signed)
Late entry.  Patient was discharged home yesterday with home health services through [facility].   Laura [name] with [facility] notified of discharge.  Orders for [facility] and hospital bed were sent to [facility] with [employer].  They were to be delivered to home by 8pm yesterday

## 2019-09-22 DIAGNOSIS — I119 Hypertensive heart disease without heart failure: Secondary | ICD-10-CM | POA: Diagnosis not present

## 2019-09-22 DIAGNOSIS — I482 Chronic atrial fibrillation, unspecified: Secondary | ICD-10-CM | POA: Diagnosis not present

## 2019-09-22 DIAGNOSIS — Z7951 Long term (current) use of inhaled steroids: Secondary | ICD-10-CM | POA: Diagnosis not present

## 2019-09-22 DIAGNOSIS — J441 Chronic obstructive pulmonary disease with (acute) exacerbation: Secondary | ICD-10-CM | POA: Diagnosis not present

## 2019-09-22 DIAGNOSIS — I272 Pulmonary hypertension, unspecified: Secondary | ICD-10-CM | POA: Diagnosis not present

## 2019-09-22 DIAGNOSIS — Z7952 Long term (current) use of systemic steroids: Secondary | ICD-10-CM | POA: Diagnosis not present

## 2019-09-22 DIAGNOSIS — K219 Gastro-esophageal reflux disease without esophagitis: Secondary | ICD-10-CM | POA: Diagnosis not present

## 2019-09-22 DIAGNOSIS — Z48815 Encounter for surgical aftercare following surgery on the digestive system: Secondary | ICD-10-CM | POA: Diagnosis not present

## 2019-09-22 DIAGNOSIS — I739 Peripheral vascular disease, unspecified: Secondary | ICD-10-CM | POA: Diagnosis not present

## 2019-09-22 DIAGNOSIS — G4733 Obstructive sleep apnea (adult) (pediatric): Secondary | ICD-10-CM | POA: Diagnosis not present

## 2019-09-22 DIAGNOSIS — Z7901 Long term (current) use of anticoagulants: Secondary | ICD-10-CM | POA: Diagnosis not present

## 2019-09-22 DIAGNOSIS — E669 Obesity, unspecified: Secondary | ICD-10-CM | POA: Diagnosis not present

## 2019-09-22 DIAGNOSIS — Z6841 Body Mass Index (BMI) 40.0 and over, adult: Secondary | ICD-10-CM | POA: Diagnosis not present

## 2019-09-23 ENCOUNTER — Other Ambulatory Visit: Payer: Self-pay

## 2019-09-23 MED ORDER — DEXILANT 60 MG PO CPDR: 60.0000 mg | DELAYED_RELEASE_CAPSULE | Freq: Every day | ORAL | 2 refills | Status: DC

## 2019-09-29 DIAGNOSIS — I272 Pulmonary hypertension, unspecified: Secondary | ICD-10-CM | POA: Diagnosis not present

## 2019-09-29 DIAGNOSIS — Z7952 Long term (current) use of systemic steroids: Secondary | ICD-10-CM | POA: Diagnosis not present

## 2019-09-29 DIAGNOSIS — I119 Hypertensive heart disease without heart failure: Secondary | ICD-10-CM | POA: Diagnosis not present

## 2019-09-29 DIAGNOSIS — K219 Gastro-esophageal reflux disease without esophagitis: Secondary | ICD-10-CM | POA: Diagnosis not present

## 2019-09-29 DIAGNOSIS — Z48815 Encounter for surgical aftercare following surgery on the digestive system: Secondary | ICD-10-CM | POA: Diagnosis not present

## 2019-09-29 DIAGNOSIS — E669 Obesity, unspecified: Secondary | ICD-10-CM | POA: Diagnosis not present

## 2019-09-29 DIAGNOSIS — J441 Chronic obstructive pulmonary disease with (acute) exacerbation: Secondary | ICD-10-CM | POA: Diagnosis not present

## 2019-09-29 DIAGNOSIS — G4733 Obstructive sleep apnea (adult) (pediatric): Secondary | ICD-10-CM | POA: Diagnosis not present

## 2019-09-29 DIAGNOSIS — Z7951 Long term (current) use of inhaled steroids: Secondary | ICD-10-CM | POA: Diagnosis not present

## 2019-09-29 DIAGNOSIS — Z6841 Body Mass Index (BMI) 40.0 and over, adult: Secondary | ICD-10-CM | POA: Diagnosis not present

## 2019-09-29 DIAGNOSIS — I482 Chronic atrial fibrillation, unspecified: Secondary | ICD-10-CM | POA: Diagnosis not present

## 2019-09-29 DIAGNOSIS — Z7901 Long term (current) use of anticoagulants: Secondary | ICD-10-CM | POA: Diagnosis not present

## 2019-09-29 DIAGNOSIS — I739 Peripheral vascular disease, unspecified: Secondary | ICD-10-CM | POA: Diagnosis not present

## 2019-09-30 ENCOUNTER — Ambulatory Visit (INDEPENDENT_AMBULATORY_CARE_PROVIDER_SITE_OTHER): Payer: BC Managed Care – PPO | Admitting: Nurse Practitioner

## 2019-09-30 ENCOUNTER — Encounter: Payer: Self-pay | Admitting: Nurse Practitioner

## 2019-09-30 ENCOUNTER — Other Ambulatory Visit: Payer: Self-pay

## 2019-09-30 VITALS — BP 111/71 | HR 74 | Temp 97.0°F | Resp 16 | Ht 64.0 in | Wt 260.0 lb

## 2019-09-30 DIAGNOSIS — I4891 Unspecified atrial fibrillation: Secondary | ICD-10-CM

## 2019-09-30 DIAGNOSIS — Z7901 Long term (current) use of anticoagulants: Secondary | ICD-10-CM | POA: Diagnosis not present

## 2019-09-30 DIAGNOSIS — Z7952 Long term (current) use of systemic steroids: Secondary | ICD-10-CM | POA: Diagnosis not present

## 2019-09-30 DIAGNOSIS — G4733 Obstructive sleep apnea (adult) (pediatric): Secondary | ICD-10-CM | POA: Diagnosis not present

## 2019-09-30 DIAGNOSIS — I482 Chronic atrial fibrillation, unspecified: Secondary | ICD-10-CM | POA: Diagnosis not present

## 2019-09-30 DIAGNOSIS — I739 Peripheral vascular disease, unspecified: Secondary | ICD-10-CM | POA: Diagnosis not present

## 2019-09-30 DIAGNOSIS — Z6841 Body Mass Index (BMI) 40.0 and over, adult: Secondary | ICD-10-CM | POA: Diagnosis not present

## 2019-09-30 DIAGNOSIS — K559 Vascular disorder of intestine, unspecified: Secondary | ICD-10-CM | POA: Diagnosis not present

## 2019-09-30 DIAGNOSIS — E669 Obesity, unspecified: Secondary | ICD-10-CM | POA: Diagnosis not present

## 2019-09-30 DIAGNOSIS — I272 Pulmonary hypertension, unspecified: Secondary | ICD-10-CM | POA: Diagnosis not present

## 2019-09-30 DIAGNOSIS — K219 Gastro-esophageal reflux disease without esophagitis: Secondary | ICD-10-CM | POA: Diagnosis not present

## 2019-09-30 DIAGNOSIS — J449 Chronic obstructive pulmonary disease, unspecified: Secondary | ICD-10-CM | POA: Diagnosis not present

## 2019-09-30 DIAGNOSIS — I119 Hypertensive heart disease without heart failure: Secondary | ICD-10-CM | POA: Diagnosis not present

## 2019-09-30 DIAGNOSIS — Z7951 Long term (current) use of inhaled steroids: Secondary | ICD-10-CM | POA: Diagnosis not present

## 2019-09-30 DIAGNOSIS — Z48815 Encounter for surgical aftercare following surgery on the digestive system: Secondary | ICD-10-CM | POA: Diagnosis not present

## 2019-09-30 DIAGNOSIS — J441 Chronic obstructive pulmonary disease with (acute) exacerbation: Secondary | ICD-10-CM | POA: Diagnosis not present

## 2019-09-30 DIAGNOSIS — R112 Nausea with vomiting, unspecified: Secondary | ICD-10-CM

## 2019-09-30 MED ORDER — ONDANSETRON HCL 4 MG PO TABS: 4.0000 mg | ORAL_TABLET | Freq: Three times a day (TID) | ORAL | 1 refills | Status: DC | PRN

## 2019-09-30 MED ORDER — SPIRONOLACTONE 25 MG PO TABS: 25.0000 mg | ORAL_TABLET | Freq: Two times a day (BID) | ORAL | 0 refills | Status: DC

## 2019-09-30 MED ORDER — FUROSEMIDE 40 MG PO TABS: 40.0000 mg | ORAL_TABLET | Freq: Every day | ORAL | 1 refills | Status: DC

## 2019-09-30 NOTE — Progress Notes (Signed)
[facility] [place], [place] [address]  Internal MEDICINE  Office Visit Note  Patient Name: Laura [name]  [id number]  [id number]  Date of Service: 10/09/2019     Chief Complaint  Patient presents with  . Hospitalization Follow-up    atrial fib, enteritis, abdominal distention     The patient is here for hospital follow up. Initially went to hospital after significant nausea and vomiting. She had a fall during this episode and was unable to get back up. She was found to have significant ischemic bowel right at the junction of large and small intestines. She had several surgeries to remove the dead bowel and bing things back together. She was released from the hospital last Thursday. She still has many episodes of nausea and vomiting. Has had some dehydration. Has been recommended to discontinue her diuretics until she follows up with the surgeon again in 2 weeks. She has very little appetite and has lost 26 pounds since her last visit.  Pt is here for recent hospital follow up.  Current Medication: Outpatient Encounter Medications as of 09/30/2019  Medication Sig  . albuterol (VENTOLIN HFA) 108 (90 Base) MCG/ACT inhaler Inhale 2 puffs into the lungs every 4 (four) hours as needed for wheezing or shortness of breath.  . ALPRAZolam (XANAX) 0.25 MG tablet Take 1 tablet (0.25 mg total) by mouth 2 (two) times daily.  Marland Kitchen EPINEPHrine 0.3 mg/0.3 mL IJ SOAJ injection Inject 0.3 mg into the muscle as needed for anaphylaxis.   . furosemide (LASIX) 40 MG tablet Take 1 tablet (40 mg total) by mouth daily.  Marland Kitchen ipratropium-albuterol (DUONEB) 0.5-2.5 (3) MG/3ML SOLN Take 3 mLs by nebulization every 4 (four) hours as needed. (Patient taking differently: Take 3 mLs by nebulization every 4 (four) hours as needed (shortness of breath / wheezing). )  . levalbuterol (XOPENEX) 0.31 MG/3ML nebulizer solution Take 3 mLs by nebulization every 4 (four) hours as needed. For wheezing/  and or shortness of breath.  . losartan (COZAAR) 25 MG tablet Take 1 tablet (25 mg total) by mouth daily.  . montelukast (SINGULAIR) 10 MG tablet TAKE 1 TABLET BY MOUTH EVERY DAY (Patient taking differently: Take 10 mg by mouth at bedtime. )  . Multiple Vitamin (MULTIVITAMIN) tablet Take 1 tablet by mouth daily.  Marland Kitchen nystatin (MYCOSTATIN) 100000 UNIT/ML suspension Take 5 mLs (500,000 Units total) by mouth 4 (four) times daily. (Patient taking differently: Take 5 mLs by mouth 4 (four) times daily as needed. )  . ondansetron (ZOFRAN) 4 MG tablet Take 1 tablet (4 mg total) by mouth every 8 (eight) hours as needed for nausea or vomiting.  . predniSONE (DELTASONE) 10 MG tablet Take 1 tablet (10 mg total) by mouth daily with breakfast.  . Probiotic Product (PROBIOTIC-10) CAPS Take 1 capsule by mouth daily.   Marland Kitchen spironolactone (ALDACTONE) 25 MG tablet Take 1 tablet (25 mg total) by mouth 2 (two) times daily.  . [DISCONTINUED] apixaban (ELIQUIS) 5 MG TABS tablet Take 1 tablet (5 mg total) by mouth 2 (two) times daily.  . [DISCONTINUED] dexlansoprazole (DEXILANT) 60 MG capsule Take 1 capsule (60 mg total) by mouth daily.  . [DISCONTINUED] diltiazem (CARDIZEM CD) 360 MG 24 hr capsule TAKE 1 CAPSULE (360 MG TOTAL) BY MOUTH DAILY.  . [DISCONTINUED] furosemide (LASIX) 40 MG tablet Take 1 tablet (40 mg total) by mouth daily.  . [DISCONTINUED] ondansetron (ZOFRAN) 4 MG tablet Take 4 mg by mouth every 8 (eight) hours as needed for  nausea or vomiting.  . [DISCONTINUED] spironolactone (ALDACTONE) 25 MG tablet Take 1 tablet (25 mg total) by mouth 2 (two) times daily.  . [DISCONTINUED] theophylline (THEO-24) 300 MG 24 hr capsule Take 1 capsule (300 mg total) by mouth daily.  . [DISCONTINUED] guaiFENesin (MUCINEX) 600 MG 12 hr tablet Take 600 mg by mouth 2 (two) times daily as needed for cough or to loosen phlegm.   . [DISCONTINUED] ondansetron (ZOFRAN) 4 MG tablet Take 1 tablet (4 mg total) by mouth every 8 (eight) hours  as needed for nausea or vomiting. (Patient not taking: Reported on 09/30/2019)  . [DISCONTINUED] ondansetron (ZOFRAN-ODT) 8 MG disintegrating tablet Take 1 tablet (8 mg total) by mouth every 8 (eight) hours as needed for nausea or vomiting. (Patient not taking: Reported on 09/30/2019)   No facility-administered encounter medications on file as of 09/30/2019.     Surgical History: Past Surgical History:  Procedure Laterality Date  . BOWEL RESECTION  09/11/2019   Procedure: SMALL BOWEL RESECTION;  Surgeon: Herbert Pun, MD;  Location: [facility];  Service: General;;  . cataract surgery    . INCISION AND DRAINAGE ABSCESS Right 06/29/2016   Procedure: INCISION AND DRAINAGE ABSCESS;  Surgeon: Florene Glen, MD;  Location: [facility];  Service: General;  Laterality: Right;  . INCISION AND DRAINAGE OF WOUND Left 06/29/2016   Procedure: IRRIGATION AND DEBRIDEMENT WOUND;  Surgeon: Florene Glen, MD;  Location: [facility];  Service: General;  Laterality: Left;  . LAPAROSCOPIC RIGHT COLECTOMY  09/11/2019   Procedure: RIGHT COLECTOMY;  Surgeon: Herbert Pun, MD;  Location: [facility];  Service: General;;  . LAPAROSCOPY N/A 09/11/2019   Procedure: LAPAROSCOPY DIAGNOSTIC;  Surgeon: Herbert Pun, MD;  Location: [facility];  Service: General;  Laterality: N/A;  . LAPAROTOMY N/A 09/13/2019   Procedure: REOPENING OF RECENT LAPAROTOMYANASTOMOSIS OF BOWEL;  Surgeon: Herbert Pun, MD;  Location: [facility];  Service: General;  Laterality: N/A;  . VISCERAL ANGIOGRAPHY N/A 09/12/2019   Procedure: VISCERAL ANGIOGRAPHY;  Surgeon: Algernon Huxley, MD;  Location: [facility];  Service: Cardiovascular;  Laterality: N/A;    Medical History: Past Medical History:  Diagnosis Date  . Asthma   . Chronic atrial fibrillation ([place])    a. diagnosed in 09/2016; b. failed flecainide and propafenone due to LE swelling and SOB, could not afford Multaq; c. CHADS2VASc => 3 (HTN, age x 1, female); d. on  Eliquis  . GERD (gastroesophageal reflux disease)   . History of stress test    a. Lexiscan Myoview 10/2016: no evidence of ischemia, EF 53%  . Hypertension   . Obesity   . Obstructive sleep apnea   . Pulmonary hypertension ([place])   . Systolic dysfunction    a. TTE 10/2016: EF 50%, mild LVH, moderately dilated LA, moderate MR/TR, mild pulmonary hypertension    Family History: Family History  Problem Relation Age of Onset  . Dementia Mother   . Osteoporosis Mother   . Vascular Disease Mother   . COPD Father     Social History   Socioeconomic History  . Marital status: Married    Spouse name: Not on file  . Number of children: Not on file  . Years of education: Not on file  . Highest education level: Not on file  Occupational History  . Not on file  Social Needs  . Financial resource strain: Not on file  . Food insecurity    Worry: Not on file    Inability: Not on  file  . Transportation needs    Medical: Not on file    Non-medical: Not on file  Tobacco Use  . Smoking status: Never Smoker  . Smokeless tobacco: Never Used  Substance and Sexual Activity  . Alcohol use: No  . Drug use: No  . Sexual activity: Not on file  Lifestyle  . Physical activity    Days per week: Not on file    Minutes per session: Not on file  . Stress: Not on file  Relationships  . Social Herbalist on phone: Not on file    Gets together: Not on file    Attends religious service: Not on file    Active member of club or organization: Not on file    Attends meetings of clubs or organizations: Not on file    Relationship status: Not on file  . Intimate partner violence    Fear of current or ex partner: Not on file    Emotionally abused: Not on file    Physically abused: Not on file    Forced sexual activity: Not on file  Other Topics Concern  . Not on file  Social History Narrative  . Not on file      Review of Systems  Constitutional: Positive for appetite change,  fatigue and unexpected weight change. Negative for chills.       Patient has lost 26 pounds since her last visit .  HENT: Negative for congestion, postnasal drip, rhinorrhea, sneezing and sore throat.   Respiratory: Positive for shortness of breath. Negative for cough and chest tightness.   Cardiovascular: Positive for leg swelling. Negative for chest pain and palpitations.       Blood pressure low. Holding diuretics for now.   Gastrointestinal: Negative for abdominal pain, constipation, diarrhea, nausea and vomiting.       Has had three surgeries on bowels since her last visit. She   Endocrine: Negative for cold intolerance, heat intolerance, polydipsia and polyuria.  Musculoskeletal: Negative for arthralgias, back pain, joint swelling and neck pain.  Skin: Negative for rash.  Allergic/Immunologic: Positive for environmental allergies.  Neurological: Negative for dizziness, tremors, numbness and headaches.  Hematological: Negative for adenopathy. Does not bruise/bleed easily.  Psychiatric/Behavioral: Negative for behavioral problems (Depression), sleep disturbance and suicidal ideas. The patient is nervous/anxious.     Today's Vitals   09/30/19 0903  BP: 111/71  Pulse: 74  Resp: 16  Temp: (!) 97 F (36.1 C)  SpO2: 99%  Weight: 260 lb (117.9 kg)  Height: 5\' 4"  (1.626 m)   Body mass index is 44.63 kg/m.  Physical Exam Vitals signs and nursing note reviewed.  Constitutional:      General: She is not in acute distress.    Appearance: Normal appearance. She is well-developed. She is obese. She is not diaphoretic.  HENT:     Head: Normocephalic and atraumatic.     Nose: Nose normal.     Mouth/Throat:     Pharynx: No oropharyngeal exudate.  Eyes:     Pupils: Pupils are equal, round, and reactive to light.  Neck:     Musculoskeletal: Normal range of motion and neck supple.     Thyroid: No thyromegaly.     Vascular: No carotid bruit or JVD.     Trachea: No tracheal deviation.   Cardiovascular:     Rate and Rhythm: Normal rate. Rhythm irregular.     Heart sounds: Murmur present. No friction rub. No gallop.  Comments: Moderate varicose veins, bilaterally. Pulmonary:     Effort: Pulmonary effort is normal. No respiratory distress.     Breath sounds: Normal breath sounds. No wheezing or rales.  Chest:     Chest wall: No tenderness.  Abdominal:     Palpations: Abdomen is soft.     Tenderness: There is abdominal tenderness.     Comments: Surgical wounds healing nicely.  Musculoskeletal: Normal range of motion.  Lymphadenopathy:     Cervical: No cervical adenopathy.  Skin:    General: Skin is warm and dry.  Neurological:     Mental Status: She is alert and oriented to person, place, and time.     Cranial Nerves: No cranial nerve deficit.  Psychiatric:        Behavior: Behavior normal.        Thought Content: Thought content normal.        Judgment: Judgment normal.   Assessment/Plan: 1. Intestinal ischemia (HCC) Reviewed all records from hospitalization, including labs, imaging, surgical notes, and progress notes. Follow up with surgery and GI as scheduled   2. Intractable vomiting with nausea, unspecified vomiting type May take zofran three times daily as needed for nausea and vomiting. Refills provided today.  - ondansetron (ZOFRAN) 4 MG tablet; Take 1 tablet (4 mg total) by mouth every 8 (eight) hours as needed for nausea or vomiting.  Dispense: 60 tablet; Refill: 1  3. Obstructive chronic bronchitis without exacerbation (HCC) Stable. Continue inhalers and respiratory medication as prescribed   4. Atrial fibrillation with rapid ventricular response (HCC) Stable. Continue regular visits with cardiology as scheduled.   General Counseling: kiona topf understanding of the findings of todays visit and agrees with plan of treatment. I have discussed any further diagnostic evaluation that may be needed or ordered today. We also reviewed her  medications today. she has been encouraged to call the office with any questions or concerns that should arise related to todays visit.    Counseling:   This patient was seen by Leretha Pol FNP Collaboration with Dr [name] as a part of collaborative care agreement    I have reviewed all medical records from hospital follow up including radiology reports and consults from other physicians. Appropriate follow up diagnostics will be scheduled as needed. Patient/ Family understands the plan of treatment. Time spent 30 minutes.   Dr [name], MD Internal Medicine

## 2019-10-01 DIAGNOSIS — Z7952 Long term (current) use of systemic steroids: Secondary | ICD-10-CM | POA: Diagnosis not present

## 2019-10-01 DIAGNOSIS — K219 Gastro-esophageal reflux disease without esophagitis: Secondary | ICD-10-CM | POA: Diagnosis not present

## 2019-10-01 DIAGNOSIS — J441 Chronic obstructive pulmonary disease with (acute) exacerbation: Secondary | ICD-10-CM | POA: Diagnosis not present

## 2019-10-01 DIAGNOSIS — G4733 Obstructive sleep apnea (adult) (pediatric): Secondary | ICD-10-CM | POA: Diagnosis not present

## 2019-10-01 DIAGNOSIS — Z7951 Long term (current) use of inhaled steroids: Secondary | ICD-10-CM | POA: Diagnosis not present

## 2019-10-01 DIAGNOSIS — I272 Pulmonary hypertension, unspecified: Secondary | ICD-10-CM | POA: Diagnosis not present

## 2019-10-01 DIAGNOSIS — Z48815 Encounter for surgical aftercare following surgery on the digestive system: Secondary | ICD-10-CM | POA: Diagnosis not present

## 2019-10-01 DIAGNOSIS — Z7901 Long term (current) use of anticoagulants: Secondary | ICD-10-CM | POA: Diagnosis not present

## 2019-10-01 DIAGNOSIS — I119 Hypertensive heart disease without heart failure: Secondary | ICD-10-CM | POA: Diagnosis not present

## 2019-10-01 DIAGNOSIS — E669 Obesity, unspecified: Secondary | ICD-10-CM | POA: Diagnosis not present

## 2019-10-01 DIAGNOSIS — Z6841 Body Mass Index (BMI) 40.0 and over, adult: Secondary | ICD-10-CM | POA: Diagnosis not present

## 2019-10-01 DIAGNOSIS — I482 Chronic atrial fibrillation, unspecified: Secondary | ICD-10-CM | POA: Diagnosis not present

## 2019-10-01 DIAGNOSIS — I739 Peripheral vascular disease, unspecified: Secondary | ICD-10-CM | POA: Diagnosis not present

## 2019-10-03 ENCOUNTER — Telehealth: Payer: Self-pay

## 2019-10-03 NOTE — Telephone Encounter (Signed)
Gave verbal order to [phone]) [facility] 1 week for 1 week ,2 week for 3 week 1 week for 3 week

## 2019-10-04 ENCOUNTER — Other Ambulatory Visit: Payer: Self-pay

## 2019-10-04 DIAGNOSIS — J4550 Severe persistent asthma, uncomplicated: Secondary | ICD-10-CM

## 2019-10-04 MED ORDER — DEXILANT 60 MG PO CPDR: 60.0000 mg | DELAYED_RELEASE_CAPSULE | Freq: Every day | ORAL | 0 refills | Status: DC

## 2019-10-04 MED ORDER — DILTIAZEM HCL ER COATED BEADS 360 MG PO CP24: 360.0000 mg | ORAL_CAPSULE | Freq: Every day | ORAL | 0 refills | Status: DC

## 2019-10-04 MED ORDER — THEOPHYLLINE ER 300 MG PO CP24: 300.0000 mg | ORAL_CAPSULE | Freq: Every day | ORAL | 0 refills | Status: DC

## 2019-10-04 NOTE — Telephone Encounter (Signed)
*  STAT* If patient is at the pharmacy, call can be transferred to refill team.   1. Which medications need to be refilled? (please list name of each medication and dose if known) ELIQUIS AND DILTIAZEM  2. Which pharmacy/location (including street and city if local pharmacy) is medication to be sent to? [facility]  3. Do they need a 30 day or 90 day supply? [place]

## 2019-10-04 NOTE — Telephone Encounter (Signed)
Refill request for Eliquis.  Requested Prescriptions   Signed Prescriptions Disp Refills  . diltiazem (CARDIZEM CD) 360 MG 24 hr capsule 90 capsule 0    Sig: Take 1 capsule (360 mg total) by mouth daily.    Authorizing Provider: Kathlyn [place] A    Ordering User: Raelene Bott, BRANDY L

## 2019-10-05 DIAGNOSIS — I119 Hypertensive heart disease without heart failure: Secondary | ICD-10-CM | POA: Diagnosis not present

## 2019-10-05 DIAGNOSIS — K219 Gastro-esophageal reflux disease without esophagitis: Secondary | ICD-10-CM | POA: Diagnosis not present

## 2019-10-05 DIAGNOSIS — Z6841 Body Mass Index (BMI) 40.0 and over, adult: Secondary | ICD-10-CM | POA: Diagnosis not present

## 2019-10-05 DIAGNOSIS — Z7901 Long term (current) use of anticoagulants: Secondary | ICD-10-CM | POA: Diagnosis not present

## 2019-10-05 DIAGNOSIS — I272 Pulmonary hypertension, unspecified: Secondary | ICD-10-CM | POA: Diagnosis not present

## 2019-10-05 DIAGNOSIS — J441 Chronic obstructive pulmonary disease with (acute) exacerbation: Secondary | ICD-10-CM | POA: Diagnosis not present

## 2019-10-05 DIAGNOSIS — I739 Peripheral vascular disease, unspecified: Secondary | ICD-10-CM | POA: Diagnosis not present

## 2019-10-05 DIAGNOSIS — Z7952 Long term (current) use of systemic steroids: Secondary | ICD-10-CM | POA: Diagnosis not present

## 2019-10-05 DIAGNOSIS — G4733 Obstructive sleep apnea (adult) (pediatric): Secondary | ICD-10-CM | POA: Diagnosis not present

## 2019-10-05 DIAGNOSIS — Z7951 Long term (current) use of inhaled steroids: Secondary | ICD-10-CM | POA: Diagnosis not present

## 2019-10-05 DIAGNOSIS — Z48815 Encounter for surgical aftercare following surgery on the digestive system: Secondary | ICD-10-CM | POA: Diagnosis not present

## 2019-10-05 DIAGNOSIS — I482 Chronic atrial fibrillation, unspecified: Secondary | ICD-10-CM | POA: Diagnosis not present

## 2019-10-05 DIAGNOSIS — E669 Obesity, unspecified: Secondary | ICD-10-CM | POA: Diagnosis not present

## 2019-10-06 ENCOUNTER — Other Ambulatory Visit: Payer: Self-pay

## 2019-10-06 DIAGNOSIS — I482 Chronic atrial fibrillation, unspecified: Secondary | ICD-10-CM | POA: Diagnosis not present

## 2019-10-06 DIAGNOSIS — Z7952 Long term (current) use of systemic steroids: Secondary | ICD-10-CM | POA: Diagnosis not present

## 2019-10-06 DIAGNOSIS — J441 Chronic obstructive pulmonary disease with (acute) exacerbation: Secondary | ICD-10-CM | POA: Diagnosis not present

## 2019-10-06 DIAGNOSIS — I739 Peripheral vascular disease, unspecified: Secondary | ICD-10-CM | POA: Diagnosis not present

## 2019-10-06 DIAGNOSIS — E669 Obesity, unspecified: Secondary | ICD-10-CM | POA: Diagnosis not present

## 2019-10-06 DIAGNOSIS — G4733 Obstructive sleep apnea (adult) (pediatric): Secondary | ICD-10-CM | POA: Diagnosis not present

## 2019-10-06 DIAGNOSIS — K219 Gastro-esophageal reflux disease without esophagitis: Secondary | ICD-10-CM | POA: Diagnosis not present

## 2019-10-06 DIAGNOSIS — Z48815 Encounter for surgical aftercare following surgery on the digestive system: Secondary | ICD-10-CM | POA: Diagnosis not present

## 2019-10-06 DIAGNOSIS — I119 Hypertensive heart disease without heart failure: Secondary | ICD-10-CM | POA: Diagnosis not present

## 2019-10-06 DIAGNOSIS — Z7901 Long term (current) use of anticoagulants: Secondary | ICD-10-CM | POA: Diagnosis not present

## 2019-10-06 DIAGNOSIS — Z7951 Long term (current) use of inhaled steroids: Secondary | ICD-10-CM | POA: Diagnosis not present

## 2019-10-06 DIAGNOSIS — Z6841 Body Mass Index (BMI) 40.0 and over, adult: Secondary | ICD-10-CM | POA: Diagnosis not present

## 2019-10-06 DIAGNOSIS — I272 Pulmonary hypertension, unspecified: Secondary | ICD-10-CM | POA: Diagnosis not present

## 2019-10-06 MED ORDER — APIXABAN 5 MG PO TABS: 5.0000 mg | ORAL_TABLET | Freq: Two times a day (BID) | ORAL | 8 refills | Status: DC

## 2019-10-06 MED ORDER — DILTIAZEM HCL ER COATED BEADS 360 MG PO CP24: 360.0000 mg | ORAL_CAPSULE | Freq: Every day | ORAL | 0 refills | Status: DC

## 2019-10-06 MED ORDER — DEXILANT 60 MG PO CPDR: 60.0000 mg | DELAYED_RELEASE_CAPSULE | Freq: Every day | ORAL | 0 refills | Status: DC

## 2019-10-06 NOTE — Telephone Encounter (Signed)
Please review for Eliquis refill, Thanks !  

## 2019-10-06 NOTE — Telephone Encounter (Signed)
Eliquis 5mg  refill request received, pt is 68yrs old, weight-117.9kg, Crea-0.93 on 09/17/2019, Diagnosis-Afib, and last seen by Christell Faith on 05/27/2019. Dose is appropriate based on dosing criteria. Will send in refill to requested pharmacy.

## 2019-10-06 NOTE — Telephone Encounter (Signed)
*  STAT* If patient is at the pharmacy, call can be transferred to refill team.   1. Which medications need to be refilled? (please list name of each medication and dose if known) Eliquis  2. Which pharmacy/location (including street and city if local pharmacy) is medication to be sent to? [facility] [address]  3. Do they need a 30 day or 90 day supply? 90  

## 2019-10-07 ENCOUNTER — Other Ambulatory Visit: Payer: Self-pay

## 2019-10-07 DIAGNOSIS — Z7901 Long term (current) use of anticoagulants: Secondary | ICD-10-CM | POA: Diagnosis not present

## 2019-10-07 DIAGNOSIS — J4550 Severe persistent asthma, uncomplicated: Secondary | ICD-10-CM

## 2019-10-07 DIAGNOSIS — I119 Hypertensive heart disease without heart failure: Secondary | ICD-10-CM | POA: Diagnosis not present

## 2019-10-07 DIAGNOSIS — Z7951 Long term (current) use of inhaled steroids: Secondary | ICD-10-CM | POA: Diagnosis not present

## 2019-10-07 DIAGNOSIS — Z6841 Body Mass Index (BMI) 40.0 and over, adult: Secondary | ICD-10-CM | POA: Diagnosis not present

## 2019-10-07 DIAGNOSIS — I739 Peripheral vascular disease, unspecified: Secondary | ICD-10-CM | POA: Diagnosis not present

## 2019-10-07 DIAGNOSIS — E669 Obesity, unspecified: Secondary | ICD-10-CM | POA: Diagnosis not present

## 2019-10-07 DIAGNOSIS — I482 Chronic atrial fibrillation, unspecified: Secondary | ICD-10-CM | POA: Diagnosis not present

## 2019-10-07 DIAGNOSIS — Z7952 Long term (current) use of systemic steroids: Secondary | ICD-10-CM | POA: Diagnosis not present

## 2019-10-07 DIAGNOSIS — Z48815 Encounter for surgical aftercare following surgery on the digestive system: Secondary | ICD-10-CM | POA: Diagnosis not present

## 2019-10-07 DIAGNOSIS — K219 Gastro-esophageal reflux disease without esophagitis: Secondary | ICD-10-CM | POA: Diagnosis not present

## 2019-10-07 DIAGNOSIS — J441 Chronic obstructive pulmonary disease with (acute) exacerbation: Secondary | ICD-10-CM | POA: Diagnosis not present

## 2019-10-07 DIAGNOSIS — G4733 Obstructive sleep apnea (adult) (pediatric): Secondary | ICD-10-CM | POA: Diagnosis not present

## 2019-10-07 DIAGNOSIS — I272 Pulmonary hypertension, unspecified: Secondary | ICD-10-CM | POA: Diagnosis not present

## 2019-10-07 MED ORDER — THEOPHYLLINE ER 300 MG PO CP24: 300.0000 mg | ORAL_CAPSULE | Freq: Every day | ORAL | 0 refills | Status: DC

## 2019-10-09 ENCOUNTER — Encounter: Payer: Self-pay | Admitting: General Surgery

## 2019-10-09 DIAGNOSIS — R1110 Vomiting, unspecified: Secondary | ICD-10-CM | POA: Insufficient documentation

## 2019-10-10 ENCOUNTER — Other Ambulatory Visit: Payer: Self-pay

## 2019-10-10 DIAGNOSIS — Z8616 Personal history of COVID-19: Secondary | ICD-10-CM

## 2019-10-10 DIAGNOSIS — Z20822 Contact with and (suspected) exposure to covid-19: Secondary | ICD-10-CM

## 2019-10-10 HISTORY — DX: Personal history of COVID-19: Z86.16

## 2019-10-11 ENCOUNTER — Other Ambulatory Visit: Payer: Self-pay

## 2019-10-11 ENCOUNTER — Telehealth: Payer: Self-pay

## 2019-10-11 NOTE — Telephone Encounter (Signed)
[facility]ed [facility] the[facility] 300mg   Take 1 tab po daily 90 with 1 refills

## 2019-10-12 LAB — NOVEL CORONAVIRUS, NAA: SARS-CoV-2, NAA: DETECTED — AB

## 2019-10-13 ENCOUNTER — Encounter: Payer: Self-pay | Admitting: General Surgery

## 2019-10-18 DIAGNOSIS — I119 Hypertensive heart disease without heart failure: Secondary | ICD-10-CM | POA: Diagnosis not present

## 2019-10-18 DIAGNOSIS — J441 Chronic obstructive pulmonary disease with (acute) exacerbation: Secondary | ICD-10-CM | POA: Diagnosis not present

## 2019-10-18 DIAGNOSIS — I272 Pulmonary hypertension, unspecified: Secondary | ICD-10-CM | POA: Diagnosis not present

## 2019-10-18 DIAGNOSIS — K219 Gastro-esophageal reflux disease without esophagitis: Secondary | ICD-10-CM | POA: Diagnosis not present

## 2019-10-18 DIAGNOSIS — Z7901 Long term (current) use of anticoagulants: Secondary | ICD-10-CM | POA: Diagnosis not present

## 2019-10-18 DIAGNOSIS — I482 Chronic atrial fibrillation, unspecified: Secondary | ICD-10-CM | POA: Diagnosis not present

## 2019-10-18 DIAGNOSIS — Z7951 Long term (current) use of inhaled steroids: Secondary | ICD-10-CM | POA: Diagnosis not present

## 2019-10-18 DIAGNOSIS — Z6841 Body Mass Index (BMI) 40.0 and over, adult: Secondary | ICD-10-CM | POA: Diagnosis not present

## 2019-10-18 DIAGNOSIS — G4733 Obstructive sleep apnea (adult) (pediatric): Secondary | ICD-10-CM | POA: Diagnosis not present

## 2019-10-18 DIAGNOSIS — E669 Obesity, unspecified: Secondary | ICD-10-CM | POA: Diagnosis not present

## 2019-10-18 DIAGNOSIS — Z48815 Encounter for surgical aftercare following surgery on the digestive system: Secondary | ICD-10-CM | POA: Diagnosis not present

## 2019-10-18 DIAGNOSIS — Z7952 Long term (current) use of systemic steroids: Secondary | ICD-10-CM | POA: Diagnosis not present

## 2019-10-18 DIAGNOSIS — I739 Peripheral vascular disease, unspecified: Secondary | ICD-10-CM | POA: Diagnosis not present

## 2019-10-19 DIAGNOSIS — G4733 Obstructive sleep apnea (adult) (pediatric): Secondary | ICD-10-CM | POA: Diagnosis not present

## 2019-10-19 DIAGNOSIS — J441 Chronic obstructive pulmonary disease with (acute) exacerbation: Secondary | ICD-10-CM | POA: Diagnosis not present

## 2019-10-19 DIAGNOSIS — Z7952 Long term (current) use of systemic steroids: Secondary | ICD-10-CM | POA: Diagnosis not present

## 2019-10-19 DIAGNOSIS — Z48815 Encounter for surgical aftercare following surgery on the digestive system: Secondary | ICD-10-CM | POA: Diagnosis not present

## 2019-10-19 DIAGNOSIS — K219 Gastro-esophageal reflux disease without esophagitis: Secondary | ICD-10-CM | POA: Diagnosis not present

## 2019-10-19 DIAGNOSIS — I739 Peripheral vascular disease, unspecified: Secondary | ICD-10-CM | POA: Diagnosis not present

## 2019-10-19 DIAGNOSIS — I272 Pulmonary hypertension, unspecified: Secondary | ICD-10-CM | POA: Diagnosis not present

## 2019-10-19 DIAGNOSIS — E669 Obesity, unspecified: Secondary | ICD-10-CM | POA: Diagnosis not present

## 2019-10-19 DIAGNOSIS — Z7901 Long term (current) use of anticoagulants: Secondary | ICD-10-CM | POA: Diagnosis not present

## 2019-10-19 DIAGNOSIS — Z6841 Body Mass Index (BMI) 40.0 and over, adult: Secondary | ICD-10-CM | POA: Diagnosis not present

## 2019-10-19 DIAGNOSIS — I482 Chronic atrial fibrillation, unspecified: Secondary | ICD-10-CM | POA: Diagnosis not present

## 2019-10-19 DIAGNOSIS — I119 Hypertensive heart disease without heart failure: Secondary | ICD-10-CM | POA: Diagnosis not present

## 2019-10-19 DIAGNOSIS — Z7951 Long term (current) use of inhaled steroids: Secondary | ICD-10-CM | POA: Diagnosis not present

## 2019-10-20 ENCOUNTER — Other Ambulatory Visit: Payer: Self-pay | Admitting: Nurse Practitioner

## 2019-10-20 MED ORDER — SPIRONOLACTONE 25 MG PO TABS: 25.0000 mg | ORAL_TABLET | Freq: Two times a day (BID) | ORAL | 0 refills | Status: DC

## 2019-10-24 ENCOUNTER — Other Ambulatory Visit: Payer: Self-pay | Admitting: Nurse Practitioner

## 2019-10-24 DIAGNOSIS — Z7952 Long term (current) use of systemic steroids: Secondary | ICD-10-CM | POA: Diagnosis not present

## 2019-10-24 DIAGNOSIS — I482 Chronic atrial fibrillation, unspecified: Secondary | ICD-10-CM | POA: Diagnosis not present

## 2019-10-24 DIAGNOSIS — G4733 Obstructive sleep apnea (adult) (pediatric): Secondary | ICD-10-CM | POA: Diagnosis not present

## 2019-10-24 DIAGNOSIS — Z6841 Body Mass Index (BMI) 40.0 and over, adult: Secondary | ICD-10-CM | POA: Diagnosis not present

## 2019-10-24 DIAGNOSIS — J441 Chronic obstructive pulmonary disease with (acute) exacerbation: Secondary | ICD-10-CM | POA: Diagnosis not present

## 2019-10-24 DIAGNOSIS — Z7951 Long term (current) use of inhaled steroids: Secondary | ICD-10-CM | POA: Diagnosis not present

## 2019-10-24 DIAGNOSIS — I119 Hypertensive heart disease without heart failure: Secondary | ICD-10-CM | POA: Diagnosis not present

## 2019-10-24 DIAGNOSIS — E669 Obesity, unspecified: Secondary | ICD-10-CM | POA: Diagnosis not present

## 2019-10-24 DIAGNOSIS — Z48815 Encounter for surgical aftercare following surgery on the digestive system: Secondary | ICD-10-CM | POA: Diagnosis not present

## 2019-10-24 DIAGNOSIS — K219 Gastro-esophageal reflux disease without esophagitis: Secondary | ICD-10-CM | POA: Diagnosis not present

## 2019-10-24 DIAGNOSIS — I272 Pulmonary hypertension, unspecified: Secondary | ICD-10-CM | POA: Diagnosis not present

## 2019-10-24 DIAGNOSIS — Z7901 Long term (current) use of anticoagulants: Secondary | ICD-10-CM | POA: Diagnosis not present

## 2019-10-24 DIAGNOSIS — I739 Peripheral vascular disease, unspecified: Secondary | ICD-10-CM | POA: Diagnosis not present

## 2019-10-24 MED ORDER — FUROSEMIDE 40 MG PO TABS: 40.0000 mg | ORAL_TABLET | Freq: Every day | ORAL | 1 refills | Status: DC

## 2019-10-26 ENCOUNTER — Other Ambulatory Visit: Payer: Self-pay | Admitting: Nurse Practitioner

## 2019-10-26 MED ORDER — FUROSEMIDE 40 MG PO TABS: 40.0000 mg | ORAL_TABLET | Freq: Every day | ORAL | 1 refills | Status: DC

## 2019-11-04 DIAGNOSIS — K591 Functional diarrhea: Secondary | ICD-10-CM | POA: Diagnosis not present

## 2019-11-04 DIAGNOSIS — K55029 Acute infarction of small intestine, extent unspecified: Secondary | ICD-10-CM | POA: Diagnosis not present

## 2019-11-08 ENCOUNTER — Telehealth: Payer: Self-pay

## 2019-11-08 NOTE — Telephone Encounter (Signed)
[facility] COMMUN[facility] SIGNED BY PROVIDER AND FAXED BACK TO [place] 11-08-19.

## 2019-11-14 ENCOUNTER — Ambulatory Visit (INDEPENDENT_AMBULATORY_CARE_PROVIDER_SITE_OTHER): Payer: BC Managed Care – PPO | Admitting: Vascular Surgery

## 2019-11-25 ENCOUNTER — Telehealth: Payer: Self-pay | Admitting: Cardiovascular Disease

## 2019-11-25 ENCOUNTER — Telehealth: Payer: Self-pay | Admitting: Physician Assistant

## 2019-11-25 NOTE — Telephone Encounter (Signed)
Called patient to cancel echocardiogram appointment for 12/8 due to she has already had performed at [facility]. Patient states she had not heard back from their office regarding her results after multiple calls. I advised the patient I would request the echocardiogram from [place] and forward to Dr. Okey Regal, PA. Patient verbalized understanding

## 2019-11-25 NOTE — Telephone Encounter (Signed)
Attempted to call patient. LMTCB 11/25/2019  

## 2019-11-25 NOTE — Telephone Encounter (Signed)
Echo performed by PCP showed normal pump function, slightly stiffened heart, mildly leaky mitral, tricuspid and pulmonic valves. No significant changes when compared to prior study. Optimal BP and HR control advised.

## 2019-11-28 DIAGNOSIS — M4316 Spondylolisthesis, lumbar region: Secondary | ICD-10-CM | POA: Diagnosis not present

## 2019-11-28 NOTE — Telephone Encounter (Signed)
Reviewed results from echo per Christell Faith, PA.   No further questions or concerns at this time.   Advised pt to call for any further questions or concerns.

## 2019-11-29 ENCOUNTER — Other Ambulatory Visit: Payer: Medicare Other

## 2019-11-29 ENCOUNTER — Encounter: Payer: Self-pay | Admitting: Nurse Practitioner

## 2019-11-29 NOTE — Telephone Encounter (Signed)
Hey. Can we go ahead and refer Ms. Polzin to see Dr. Vicente Males? Thanks.

## 2019-11-30 ENCOUNTER — Other Ambulatory Visit: Payer: Self-pay

## 2019-11-30 ENCOUNTER — Ambulatory Visit (INDEPENDENT_AMBULATORY_CARE_PROVIDER_SITE_OTHER): Payer: BC Managed Care – PPO

## 2019-11-30 DIAGNOSIS — G4733 Obstructive sleep apnea (adult) (pediatric): Secondary | ICD-10-CM

## 2019-11-30 NOTE — Telephone Encounter (Signed)
She has ischemic necrosis of small bowel and functional diarrhea.

## 2019-11-30 NOTE — Progress Notes (Signed)
MRN : [id number]  Laura [name] is a 68 y.o. ([birth year]) female who presents with chief complaint of No chief complaint on file. Marland Kitchen  History of Present Illness:   The patient returns to the office for followup and review status post angiogram, no intervention on 09/12/2019.   At that time mesenteric angiography was performed.  Findings of minimal disease were note no large embolism identified.  Subsequently, the patient required emergent bowel resection in September.  She is recovered from this and seems to be doing well with respect to her acute bowel ischemia.  She was seen 3 years ago for pain and swelling of the lower extremity and found to have DVT. The patient denies history of PE, no history of recurrent DVT or right leg superficial thrombophlebitis. She continues to have some pain and swelling of the leg. She has been trying to wear compression.  There have been no significant changes to the patient's overall health care.  The patient denies amaurosis fugax or recent TIA symptoms. There are no recent neurological changes noted.  The patient denies recent episodes of angina or shortness of breath.    No outpatient medications have been marked as taking for the 12/05/19 encounter (Appointment) with Delana Meyer, Dolores Lory, MD.    Past Medical History:  Diagnosis Date  . Asthma   . Chronic atrial fibrillation ([place])    a. diagnosed in 09/2016; b. failed flecainide and propafenone due to LE swelling and SOB, could not afford Multaq; c. CHADS2VASc => 3 (HTN, age x 1, female); d. on Eliquis  . GERD (gastroesophageal reflux disease)   . History of stress test    a. Lexiscan Myoview 10/2016: no evidence of ischemia, EF 53%  . Hypertension   . Obesity   . Obstructive sleep apnea   . Pulmonary hypertension ([place])   . Systolic dysfunction    a. TTE 10/2016: EF 50%, mild LVH, moderately dilated LA, moderate MR/TR, mild pulmonary hypertension    Past Surgical History:  Procedure  Laterality Date  . BOWEL RESECTION  09/11/2019   Procedure: SMALL BOWEL RESECTION;  Surgeon: Herbert Pun, MD;  Location: [facility];  Service: General;;  . cataract surgery    . INCISION AND DRAINAGE ABSCESS Right 06/29/2016   Procedure: INCISION AND DRAINAGE ABSCESS;  Surgeon: Florene Glen, MD;  Location: [facility];  Service: General;  Laterality: Right;  . INCISION AND DRAINAGE OF WOUND Left 06/29/2016   Procedure: IRRIGATION AND DEBRIDEMENT WOUND;  Surgeon: Florene Glen, MD;  Location: [facility];  Service: General;  Laterality: Left;  . LAPAROSCOPIC RIGHT COLECTOMY  09/11/2019   Procedure: RIGHT COLECTOMY;  Surgeon: Herbert Pun, MD;  Location: [facility];  Service: General;;  . LAPAROSCOPY N/A 09/11/2019   Procedure: LAPAROSCOPY DIAGNOSTIC;  Surgeon: Herbert Pun, MD;  Location: [facility];  Service: General;  Laterality: N/A;  . LAPAROTOMY N/A 09/13/2019   Procedure: REOPENING OF RECENT LAPAROTOMYANASTOMOSIS OF BOWEL;  Surgeon: Herbert Pun, MD;  Location: [facility];  Service: General;  Laterality: N/A;  . VISCERAL ANGIOGRAPHY N/A 09/12/2019   Procedure: VISCERAL ANGIOGRAPHY;  Surgeon: Algernon Huxley, MD;  Location: [facility];  Service: Cardiovascular;  Laterality: N/A;    Social History Social History   Tobacco Use  . Smoking status: Never Smoker  . Smokeless tobacco: Never Used  Substance Use Topics  . Alcohol use: No  . Drug use: No    Family History Family History  Problem Relation Age of Onset  .  Dementia Mother   . Osteoporosis Mother   . Vascular Disease Mother   . COPD Father     Allergies  Allergen Reactions  . Flecainide Shortness Of Breath  . Propafenone Shortness Of Breath and Swelling  . Rivaroxaban Other (See Comments)     REVIEW OF SYSTEMS (Negative unless checked)  Constitutional: [] Weight loss  [] Fever  [] Chills Cardiac: [] Chest pain   [] Chest pressure   [x] Palpitations   [] Shortness of breath when laying flat    [] Shortness of breath with exertion. Vascular:  [] Pain in legs with walking   [] Pain in legs at rest  [x] History of DVT   [] Phlebitis   [x] Swelling in legs   [] Varicose veins   [] Non-healing ulcers Pulmonary:   [] Uses home oxygen   [] Productive cough   [] Hemoptysis   [] Wheeze  [] COPD   [] Asthma Neurologic:  [] Dizziness   [] Seizures   [] History of stroke   [] History of TIA  [] Aphasia   [] Vissual changes   [] Weakness or numbness in arm   [] Weakness or numbness in leg Musculoskeletal:   [] Joint swelling   [] Joint pain   [] Low back pain Hematologic:  [] Easy bruising  [] Easy bleeding   [] Hypercoagulable state   [] Anemic Gastrointestinal:  [] Diarrhea   [] Vomiting  [] Gastroesophageal reflux/heartburn   [] Difficulty swallowing. Genitourinary:  [] Chronic kidney disease   [] Difficult urination  [] Frequent urination   [] Blood in urine Skin:  [] Rashes   [] Ulcers  Psychological:  [] History of anxiety   []  History of major depression.  Physical Examination  There were no vitals filed for this visit. There is no height or weight on file to calculate BMI. Gen: WD/WN, NAD Head: [place]/AT, No temporalis wasting.  Ear/Nose/Throat: Hearing grossly intact, nares w/o erythema or drainage Eyes: PER, EOMI, sclera nonicteric.  Neck: Supple, no large masses.   Pulmonary:  Good air movement, no audible wheezing bilaterally, no use of accessory muscles.  Cardiac: RRR, no JVD Vascular: Large varicosities present extensively greater than 10 mm bilaterally.  Severe venous stasis changes to the legs bilaterally.  2+ soft pitting edema Vessel Right Left  Radial Palpable Palpable  PT Not Palpable Not Palpable  DP Not Palpable Not Palpable  Gastrointestinal: Non-distended. No guarding/no peritoneal signs.  Musculoskeletal: M/S 5/5 throughout.  No deformity or atrophy.  Neurologic: CN 2-12 intact. Symmetrical.  Speech is fluent. Motor exam as listed above. Psychiatric: Judgment intact, Mood & affect appropriate for pt's  clinical situation. Dermatologic: No rashes or ulcers noted.  No changes consistent with cellulitis. Lymph : No lichenification or skin changes of chronic lymphedema.  CBC Lab Results  Component Value Date   WBC 10.5 09/20/2019   HGB 13.3 09/20/2019   HCT 40.8 09/20/2019   MCV 91.7 09/20/2019   PLT 397 09/20/2019    BMET    Component Value Date/Time   NA 141 09/17/2019 0459   NA 136 01/28/2019 1028   NA 140 11/28/2013 2312   K 4.8 09/17/2019 0459   K 4.1 11/28/2013 2312   CL 105 09/17/2019 0459   CL 108 (H) 11/28/2013 2312   CO2 29 09/17/2019 0459   CO2 27 11/28/2013 2312   GLUCOSE 100 (H) 09/17/2019 0459   GLUCOSE 130 (H) 11/28/2013 2312   BUN 20 09/17/2019 0459   BUN 30 (H) 01/28/2019 1028   BUN 29 (H) 11/28/2013 2312   CREATININE 0.93 09/17/2019 0459   CREATININE 0.79 11/28/2013 2312   CALCIUM 9.0 09/17/2019 0459   CALCIUM 8.7 11/28/2013 2312   GFRNONAA >60  09/17/2019 0459   GFRNONAA >60 11/28/2013 2312   GFRAA >60 09/17/2019 0459   GFRAA >60 11/28/2013 2312   CrCl cannot be calculated (Patient's most recent lab result is older than the maximum 21 days allowed.).  COAG Lab Results  Component Value Date   INR 1.2 09/11/2019    Radiology No results found.  Assessment/Plan 1. Chronic venous insufficiency No surgery or intervention at this point in time.    I have had a long discussion with the patient regarding venous insufficiency and why it  causes symptoms. I have discussed with the patient the chronic skin changes that accompany venous insufficiency and the long term sequela such as infection and ulceration.  Patient will begin wearing graduated compression stockings class 1 (20-30 mmHg) or compression wraps on a daily basis a prescription was given. The patient will put the stockings on first thing in the morning and removing them in the evening. The patient is instructed specifically not to sleep in the stockings.    In addition, behavioral modification  including several periods of elevation of the lower extremities during the day will be continued. I have demonstrated that proper elevation is a position with the ankles at heart level.  The patient is instructed to begin routine exercise, especially walking on a daily basis  Following the review of the ultrasound the patient will follow up in 6 months to reassess the degree of swelling and the control that graduated compression stockings or compression wraps  is offering.   The patient can be assessed for a Lymph Pump at that time and this was discussed today, we have elected not to pursue lymph pump at this time  2. Atherosclerosis of both carotid arteries Recommend:  Given the patient's asymptomatic subcritical stenosis no further invasive testing or surgery at this time.  Continue antiplatelet therapy as prescribed Continue management of CAD, HTN and Hyperlipidemia Healthy heart diet,  encouraged exercise at least 4 times per week Follow up in 6 months with duplex ultrasound and physical exam  - Carotid; Future  3. PAD (peripheral artery disease) (HCC)  Recommend:  The patient has evidence of atherosclerosis of the lower extremities with claudication.  The patient does not voice lifestyle limiting changes at this point in time.  Noninvasive studies do not suggest clinically significant change.  No invasive studies, angiography or surgery at this time The patient should continue walking and begin a more formal exercise program.  The patient should continue antiplatelet therapy and aggressive treatment of the lipid abnormalities  No changes in the patient's medications at this time  - ABI; Future  4. Chronic atrial fibrillation (HCC) Continue antiarrhythmia medications as already ordered, these medications have been reviewed and there are no changes at this time.  Continue anticoagulation as ordered by Cardiology Service   5. Essential hypertension Continue antihypertensive  medications as already ordered, these medications have been reviewed and there are no changes at this time.    Hortencia Pilar, MD  11/30/2019 8:17 AM

## 2019-11-30 NOTE — Progress Notes (Signed)
95 percentile pressure 14   95th percentile leak 22   apnea index 0.7 /hr  apnea-hypopnea index  1.1 /hr   total days used  >4 hr 80 days  total days used <4 hr 6 days  Total compliance 89 percent  Doing great on cpap no problems or questions

## 2019-12-05 ENCOUNTER — Encounter (INDEPENDENT_AMBULATORY_CARE_PROVIDER_SITE_OTHER): Payer: Self-pay | Admitting: Vascular Surgery

## 2019-12-05 ENCOUNTER — Ambulatory Visit (INDEPENDENT_AMBULATORY_CARE_PROVIDER_SITE_OTHER): Payer: BC Managed Care – PPO | Admitting: Vascular Surgery

## 2019-12-05 ENCOUNTER — Other Ambulatory Visit: Payer: Self-pay

## 2019-12-05 VITALS — BP 161/72 | HR 89 | Resp 16 | Wt 263.0 lb

## 2019-12-05 DIAGNOSIS — I10 Essential (primary) hypertension: Secondary | ICD-10-CM

## 2019-12-05 DIAGNOSIS — I482 Chronic atrial fibrillation, unspecified: Secondary | ICD-10-CM

## 2019-12-05 DIAGNOSIS — I739 Peripheral vascular disease, unspecified: Secondary | ICD-10-CM

## 2019-12-05 DIAGNOSIS — I872 Venous insufficiency (chronic) (peripheral): Secondary | ICD-10-CM

## 2019-12-05 DIAGNOSIS — I6523 Occlusion and stenosis of bilateral carotid arteries: Secondary | ICD-10-CM | POA: Diagnosis not present

## 2019-12-07 ENCOUNTER — Encounter: Payer: Self-pay | Admitting: *Deleted

## 2019-12-07 DIAGNOSIS — S76312S Strain of muscle, fascia and tendon of the posterior muscle group at thigh level, left thigh, sequela: Secondary | ICD-10-CM | POA: Diagnosis not present

## 2019-12-07 DIAGNOSIS — S76311S Strain of muscle, fascia and tendon of the posterior muscle group at thigh level, right thigh, sequela: Secondary | ICD-10-CM | POA: Diagnosis not present

## 2019-12-07 DIAGNOSIS — M545 Low back pain: Secondary | ICD-10-CM | POA: Diagnosis not present

## 2019-12-10 ENCOUNTER — Encounter: Payer: Self-pay | Admitting: Nurse Practitioner

## 2019-12-12 ENCOUNTER — Telehealth: Payer: Self-pay

## 2019-12-12 NOTE — Telephone Encounter (Signed)
Orders signed and placed in [place] folder on 12-12-19.

## 2019-12-13 ENCOUNTER — Other Ambulatory Visit: Payer: Self-pay | Admitting: Nurse Practitioner

## 2019-12-13 DIAGNOSIS — M5430 Sciatica, unspecified side: Secondary | ICD-10-CM

## 2019-12-13 MED ORDER — PREDNISONE 10 MG (48) PO TBPK: ORAL_TABLET | ORAL | 0 refills | Status: DC

## 2019-12-13 NOTE — Progress Notes (Signed)
Sent prednisone taper - take as directed for 12 days. She should hold her normal, daily dose of prednisone while on taper. After completion, she can go back to daily dose.

## 2019-12-23 DIAGNOSIS — I639 Cerebral infarction, unspecified: Secondary | ICD-10-CM

## 2019-12-23 HISTORY — DX: Cerebral infarction, unspecified: I63.9

## 2019-12-28 ENCOUNTER — Other Ambulatory Visit: Payer: Self-pay

## 2019-12-28 MED ORDER — DEXILANT 60 MG PO CPDR: 60.0000 mg | DELAYED_RELEASE_CAPSULE | Freq: Every day | ORAL | 0 refills | Status: DC

## 2019-12-29 ENCOUNTER — Telehealth: Payer: Self-pay

## 2019-12-29 ENCOUNTER — Other Ambulatory Visit: Payer: Self-pay | Admitting: Adult Health

## 2019-12-29 DIAGNOSIS — J4550 Severe persistent asthma, uncomplicated: Secondary | ICD-10-CM

## 2019-12-29 NOTE — Telephone Encounter (Signed)
LMOM FOR PATIENT TO CONFIRM AND SCREEN FOR 01-02-20 OV.

## 2020-01-02 ENCOUNTER — Ambulatory Visit (INDEPENDENT_AMBULATORY_CARE_PROVIDER_SITE_OTHER): Payer: BC Managed Care – PPO | Admitting: Nurse Practitioner

## 2020-01-02 ENCOUNTER — Encounter: Payer: Self-pay | Admitting: Nurse Practitioner

## 2020-01-02 ENCOUNTER — Other Ambulatory Visit: Payer: Self-pay

## 2020-01-02 ENCOUNTER — Other Ambulatory Visit: Payer: Self-pay | Admitting: Nurse Practitioner

## 2020-01-02 VITALS — BP 130/85 | HR 79 | Resp 16 | Ht 64.0 in | Wt 269.8 lb

## 2020-01-02 DIAGNOSIS — R413 Other amnesia: Secondary | ICD-10-CM | POA: Diagnosis not present

## 2020-01-02 DIAGNOSIS — Z5181 Encounter for therapeutic drug level monitoring: Secondary | ICD-10-CM

## 2020-01-02 DIAGNOSIS — E639 Nutritional deficiency, unspecified: Secondary | ICD-10-CM | POA: Insufficient documentation

## 2020-01-02 DIAGNOSIS — J4550 Severe persistent asthma, uncomplicated: Secondary | ICD-10-CM

## 2020-01-02 DIAGNOSIS — E538 Deficiency of other specified B group vitamins: Secondary | ICD-10-CM | POA: Insufficient documentation

## 2020-01-02 DIAGNOSIS — R0602 Shortness of breath: Secondary | ICD-10-CM | POA: Diagnosis not present

## 2020-01-02 DIAGNOSIS — K559 Vascular disorder of intestine, unspecified: Secondary | ICD-10-CM | POA: Diagnosis not present

## 2020-01-02 DIAGNOSIS — R7301 Impaired fasting glucose: Secondary | ICD-10-CM | POA: Diagnosis not present

## 2020-01-02 DIAGNOSIS — D509 Iron deficiency anemia, unspecified: Secondary | ICD-10-CM | POA: Diagnosis not present

## 2020-01-02 MED ORDER — CYANOCOBALAMIN 1000 MCG/ML IJ SOLN: 1000.0000 ug | Freq: Once | INTRAMUSCULAR | Status: DC

## 2020-01-02 NOTE — Progress Notes (Signed)
[facility] [place], [place] [address]  Internal MEDICINE  Office Visit Note  Patient Name: Laura [name]  [id number]  [id number]  Date of Service: 01/02/2020  Chief Complaint  Patient presents with  . Hypertension  . Gastroesophageal Reflux  . Asthma  . Medication Management    due to surgery, pt would like to see if there any medications that can be changed to liquid/chewable tabs, and pt can longer take b12 in tab form and would like b12 injections    The patient states that she has been "sick all weekend."  Since having surgery on the colon, she has frequent episodes of diarrhea. Has several every day. Has to have salt/sugar water every day, as regular water goes right through her and causes increased diarrhea. She states that she is lightheaded, her legs are sore and weak. She has swelling in both lower legs. She is also having trouble with her memory. She is having trouble walking as pain and weakness in her legsShe states that she may have vitamin B12 deficiency. Since having gastric surgery, she has had to be taking her vitamins in chewable form to get these absorbed. Afraid she might have some issues with other medications being absorbed. Afraid she may be developing dementia. She has not had any B12 in some time. Has not had labs checked since 09/20/2019. Vision is also getting worse. Did have cataract surgery, but vision continues to get worse. She states that he told her that issue may be nutritional. She is scheduled to see a new GI provider on 01/2020. Her insurance complany is going to set her up with nutritionist as well.       Current Medication: Outpatient Encounter Medications as of 01/02/2020  Medication Sig  . albuterol (VENTOLIN HFA) 108 (90 Base) MCG/ACT inhaler Inhale 2 puffs into the lungs every 4 (four) hours as needed for wheezing or shortness of breath.  . ALPRAZolam (XANAX) 0.25 MG tablet Take 1 tablet (0.25 mg total) by mouth 2 (two)  times daily.  Marland Kitchen apixaban (ELIQUIS) 5 MG TABS tablet Take 1 tablet (5 mg total) by mouth 2 (two) times daily.  Marland Kitchen dexlansoprazole (DEXILANT) 60 MG capsule Take 1 capsule (60 mg total) by mouth daily.  Marland Kitchen diltiazem (CARDIZEM CD) 360 MG 24 hr capsule Take 1 capsule (360 mg total) by mouth daily.  Marland Kitchen EPINEPHrine 0.3 mg/0.3 mL IJ SOAJ injection Inject 0.3 mg into the muscle as needed for anaphylaxis.   . furosemide (LASIX) 40 MG tablet Take 1 tablet (40 mg total) by mouth daily.  Marland Kitchen ipratropium-albuterol (DUONEB) 0.5-2.5 (3) MG/3ML SOLN Take 3 mLs by nebulization every 4 (four) hours as needed. (Patient taking differently: Take 3 mLs by nebulization every 4 (four) hours as needed (shortness of breath / wheezing). )  . levalbuterol (XOPENEX) 0.31 MG/3ML nebulizer solution Take 3 mLs by nebulization every 4 (four) hours as needed. For wheezing/ and or shortness of breath.  . losartan (COZAAR) 25 MG tablet Take 1 tablet (25 mg total) by mouth daily.  . metaxalone (SKELAXIN) 800 MG tablet metaxalone 800 mg tablet  Take 1 tablet every 8 hours by oral route.  . montelukast (SINGULAIR) 10 MG tablet TAKE 1 TABLET BY MOUTH EVERY DAY (Patient taking differently: Take 10 mg by mouth at bedtime. )  . Multiple Vitamin (MULTIVITAMIN) tablet Take 1 tablet by mouth daily.  . ondansetron (ZOFRAN) 4 MG tablet Take 1 tablet (4 mg total) by mouth every 8 (eight) hours  as needed for nausea or vomiting.  . predniSONE (DELTASONE) 10 MG tablet Take 1 tablet (10 mg total) by mouth daily with breakfast.  . Probiotic Product (PROBIOTIC-10) CAPS Take 1 capsule by mouth daily.   Marland Kitchen spironolactone (ALDACTONE) 25 MG tablet Take 1 tablet (25 mg total) by mouth 2 (two) times daily.  Marland Kitchen THEO-24 300 MG 24 hr capsule TAKE 1 CAPSULE BY MOUTH EVERY DAY  . [DISCONTINUED] apixaban (ELIQUIS) 5 MG TABS tablet Take 1 tablet (5 mg total) by mouth 2 (two) times daily.  . [DISCONTINUED] dexlansoprazole (DEXILANT) 60 MG capsule Take 1 capsule (60 mg  total) by mouth daily.  . [DISCONTINUED] diltiazem (CARDIZEM CD) 360 MG 24 hr capsule TAKE 1 CAPSULE (360 MG TOTAL) BY MOUTH DAILY.  . [DISCONTINUED] furosemide (LASIX) 40 MG tablet Take 1 tablet (40 mg total) by mouth daily.  . [DISCONTINUED] nystatin (MYCOSTATIN) 100000 UNIT/ML suspension Take 5 mLs (500,000 Units total) by mouth 4 (four) times daily. (Patient not taking: Reported on 01/02/2020)  . [DISCONTINUED] predniSONE (STERAPRED UNI-PAK 48 TAB) 10 MG (48) TBPK tablet 12 day taper - take by mouth as directed for 12 days (Patient not taking: Reported on 01/02/2020)  . [DISCONTINUED] spironolactone (ALDACTONE) 25 MG tablet Take 1 tablet (25 mg total) by mouth 2 (two) times daily.  . [DISCONTINUED] theophylline (THEO-24) 300 MG 24 hr capsule Take 1 capsule (300 mg total) by mouth daily.   Facility-Administered Encounter Medications as of 01/02/2020  Medication  . cyanocobalamin ((VITAMIN B-12)) injection 1,000 mcg    Surgical History: Past Surgical History:  Procedure Laterality Date  . BOWEL RESECTION  09/11/2019   Procedure: SMALL BOWEL RESECTION;  Surgeon: Herbert Pun, MD;  Location: [facility];  Service: General;;  . cataract surgery    . INCISION AND DRAINAGE ABSCESS Right 06/29/2016   Procedure: INCISION AND DRAINAGE ABSCESS;  Surgeon: Florene Glen, MD;  Location: [facility];  Service: General;  Laterality: Right;  . INCISION AND DRAINAGE OF WOUND Left 06/29/2016   Procedure: IRRIGATION AND DEBRIDEMENT WOUND;  Surgeon: Florene Glen, MD;  Location: [facility];  Service: General;  Laterality: Left;  . LAPAROSCOPIC RIGHT COLECTOMY  09/11/2019   Procedure: RIGHT COLECTOMY;  Surgeon: Herbert Pun, MD;  Location: [facility];  Service: General;;  . LAPAROSCOPY N/A 09/11/2019   Procedure: LAPAROSCOPY DIAGNOSTIC;  Surgeon: Herbert Pun, MD;  Location: [facility];  Service: General;  Laterality: N/A;  . LAPAROTOMY N/A 09/13/2019   Procedure: REOPENING OF RECENT  LAPAROTOMYANASTOMOSIS OF BOWEL;  Surgeon: Herbert Pun, MD;  Location: [facility];  Service: General;  Laterality: N/A;  . VISCERAL ANGIOGRAPHY N/A 09/12/2019   Procedure: VISCERAL ANGIOGRAPHY;  Surgeon: Algernon Huxley, MD;  Location: [facility];  Service: Cardiovascular;  Laterality: N/A;    Medical History: Past Medical History:  Diagnosis Date  . Asthma   . Chronic atrial fibrillation ([place])    a. diagnosed in 09/2016; b. failed flecainide and propafenone due to LE swelling and SOB, could not afford Multaq; c. CHADS2VASc => 3 (HTN, age x 1, female); d. on Eliquis  . GERD (gastroesophageal reflux disease)   . History of stress test    a. Lexiscan Myoview 10/2016: no evidence of ischemia, EF 53%  . Hypertension   . Obesity   . Obstructive sleep apnea   . Pulmonary hypertension ([place])   . Systolic dysfunction    a. TTE 10/2016: EF 50%, mild LVH, moderately dilated LA, moderate MR/TR, mild pulmonary hypertension  Family History: Family History  Problem Relation Age of Onset  . Dementia Mother   . Osteoporosis Mother   . Vascular Disease Mother   . COPD Father     Social History   Socioeconomic History  . Marital status: Married    Spouse name: Not on file  . Number of children: Not on file  . Years of education: Not on file  . Highest education level: Not on file  Occupational History  . Not on file  Tobacco Use  . Smoking status: Never Smoker  . Smokeless tobacco: Never Used  Substance and Sexual Activity  . Alcohol use: No  . Drug use: No  . Sexual activity: Not on file  Other Topics Concern  . Not on file  Social History Narrative  . Not on file   Social Determinants of Health   Financial Resource Strain:   . Difficulty of Paying Living Expenses: Not on file  Food Insecurity:   . Worried About Charity fundraiser in the Last Year: Not on file  . Ran Out of Food in the Last Year: Not on file  Transportation Needs:   . Lack of Transportation  (Medical): Not on file  . Lack of Transportation (Non-Medical): Not on file  Physical Activity:   . Days of Exercise per Week: Not on file  . Minutes of Exercise per Session: Not on file  Stress:   . Feeling of Stress : Not on file  Social Connections:   . Frequency of Communication with Friends and Family: Not on file  . Frequency of Social Gatherings with Friends and Family: Not on file  . Attends Religious Services: Not on file  . Active Member of Clubs or Organizations: Not on file  . Attends Archivist Meetings: Not on file  . Marital Status: Not on file  Intimate Partner Violence:   . Fear of Current or Ex-Partner: Not on file  . Emotionally Abused: Not on file  . Physically Abused: Not on file  . Sexually Abused: Not on file      Review of Systems  Constitutional: Positive for activity change, appetite change and fatigue. Negative for chills and unexpected weight change.  HENT: Negative for congestion, postnasal drip, rhinorrhea, sneezing and sore throat.   Respiratory: Positive for shortness of breath and wheezing. Negative for cough and chest tightness.        States that she started taking her theophylline twice daily yesterday.    Cardiovascular: Positive for palpitations. Negative for chest pain.  Gastrointestinal: Positive for abdominal distention and diarrhea. Negative for abdominal pain, constipation, nausea and vomiting.  Endocrine: Negative for cold intolerance, heat intolerance, polydipsia and polyuria.  Musculoskeletal: Positive for arthralgias, gait problem and myalgias. Negative for back pain, joint swelling and neck pain.       Muscle pain and weakness is getting so severe she can hardly walk. Needs to have walker most times. Recently purchased a lift chair.   Skin: Negative for rash.  Neurological: Positive for dizziness and weakness. Negative for tremors, numbness and headaches.  Hematological: Negative for adenopathy. Does not bruise/bleed easily.   Psychiatric/Behavioral: Negative for behavioral problems (Depression), sleep disturbance and suicidal ideas. The patient is nervous/anxious.     Today's Vitals   01/02/20 0955  BP: 130/85  Pulse: 79  Resp: 16  SpO2: 96%  Weight: 269 lb 12.8 oz (122.4 kg)  Height: 5\' 4"  (1.626 m)   Body mass index is 46.31 kg/m.  Physical  Exam Vitals and nursing note reviewed.  Constitutional:      General: She is in acute distress.     Appearance: Normal appearance. She is well-developed. She is obese. She is not diaphoretic.  HENT:     Head: Normocephalic and atraumatic.     Nose: Nose normal.     Mouth/Throat:     Pharynx: No oropharyngeal exudate.  Eyes:     Pupils: Pupils are equal, round, and reactive to light.  Neck:     Thyroid: No thyromegaly.     Vascular: No carotid bruit or JVD.     Trachea: No tracheal deviation.  Cardiovascular:     Rate and Rhythm: Normal rate. Rhythm irregular.     Heart sounds: Murmur present. No friction rub. No gallop.      Comments: Moderate varicose veins, bilaterally. Pulmonary:     Effort: Pulmonary effort is normal. No respiratory distress.     Breath sounds: Wheezing present. No rales.     Comments: Mild inspiratory and expiratory wheezes heard throughout the lung fields.  Chest:     Chest wall: No tenderness.  Abdominal:     Palpations: Abdomen is soft.     Tenderness: There is abdominal tenderness.  Musculoskeletal:        General: Normal range of motion.     Cervical back: Normal range of motion and neck supple.  Lymphadenopathy:     Cervical: No cervical adenopathy.  Skin:    General: Skin is warm and dry.  Neurological:     Mental Status: She is alert and oriented to person, place, and time.     Cranial Nerves: No cranial nerve deficit.  Psychiatric:        Attention and Perception: Attention and perception normal.        Mood and Affect: Affect normal. Mood is anxious.        Speech: Speech normal.        Behavior: Behavior  normal. Behavior is cooperative.        Thought Content: Thought content normal.        Cognition and Memory: Cognition and memory normal.        Judgment: Judgment normal.   Assessment/Plan: 1. Nutritional deficiency Check labs, including CBC, full anemia panel, BMP, magnesium, and phosphorus levels. Treat as indicate.d   2. Intestinal ischemia [facility]) Surgery done 08/2019 to remove ischemic bowel. Nutritional absorption issues likely   3. Memory deficit heck labs, including CBC, full anemia panel, BMP, RPR, thyroid panel, magnesium, and phosphorus levels. Treat as indicated   4. Shortness of breath Stable. Continue inhalers and respiratory medications as prescribed   5. Severe persistent asthma without complication Stable. Continue inhalers and respiratory medications as prescribed   6. Encounter for therapeutic drug level monitoring Theophylline recently changed from theo24 to theophylline once daily.  Will check theophylline level and adjust dosing as indicated.   7. B12 deficiency b12 injection administered today  - cyanocobalamin ((VITAMIN B-12)) injection 1,000 mcg  General Counseling: Caeden verbalizes understanding of the findings of todays visit and agrees with plan of treatment. I have discussed any further diagnostic evaluation that may be needed or ordered today. We also reviewed her medications today. she has been encouraged to call the office with any questions or concerns that should arise related to todays visit.   This patient was seen by Athalia with Dr [name] as a part of collaborative care agreement  Meds ordered this encounter  Medications  . cyanocobalamin ((VITAMIN B-12)) injection 1,000 mcg    Total Time spent: 35 Minutes  Time spent with patient included reviewing progress notes, labs, imaging studies, and discussing plan for follow up.    Dr [name] Internal medicine

## 2020-01-03 LAB — IRON AND TIBC
Iron Saturation: 55 % (ref 15–55)
Iron: 227 ug/dL — ABNORMAL HIGH (ref 27–139)
Total Iron Binding Capacity: 412 ug/dL (ref 250–450)
UIBC: 185 ug/dL (ref 118–369)

## 2020-01-03 LAB — FERRITIN: Ferritin: 25 ng/mL (ref 15–150)

## 2020-01-03 LAB — BASIC METABOLIC PANEL
BUN/Creatinine Ratio: 15 (ref 12–28)
BUN: 13 mg/dL (ref 8–27)
CO2: 22 mmol/L (ref 20–29)
Calcium: 9.3 mg/dL (ref 8.7–10.3)
Chloride: 106 mmol/L (ref 96–106)
Creatinine, Ser: 0.88 mg/dL (ref 0.57–1.00)
GFR calc Af Amer: 78 mL/min/{1.73_m2} (ref >59)
GFR calc non Af Amer: 67 mL/min/{1.73_m2} (ref >59)
Glucose: 113 mg/dL — ABNORMAL HIGH (ref 65–99)
Potassium: 4.1 mmol/L (ref 3.5–5.2)
Sodium: 143 mmol/L (ref 134–144)

## 2020-01-03 LAB — TSH: TSH: 1.81 u[IU]/mL (ref 0.450–4.500)

## 2020-01-03 LAB — CBC
Hematocrit: 33.5 % — ABNORMAL LOW (ref 34.0–46.6)
Hemoglobin: 10.7 g/dL — ABNORMAL LOW (ref 11.1–15.9)
MCH: 27.2 pg (ref 26.6–33.0)
MCHC: 31.9 g/dL (ref 31.5–35.7)
MCV: 85 fL (ref 79–97)
Platelets: 346 10*3/uL (ref 150–450)
RBC: 3.93 x10E6/uL (ref 3.77–5.28)
RDW: 14.6 % (ref 11.7–15.4)
WBC: 7.8 10*3/uL (ref 3.4–10.8)

## 2020-01-03 LAB — RPR QUALITATIVE: RPR Ser Ql: NONREACTIVE

## 2020-01-03 LAB — MAGNESIUM: Magnesium: 2.1 mg/dL (ref 1.6–2.3)

## 2020-01-03 LAB — THEOPHYLLINE LEVEL: Theophylline, Serum: 6.1 ug/mL — ABNORMAL LOW (ref 10.0–20.0)

## 2020-01-03 LAB — HGB A1C W/O EAG: Hgb A1c MFr Bld: 5.9 % — ABNORMAL HIGH (ref 4.8–5.6)

## 2020-01-03 LAB — T4, FREE: Free T4: 1.48 ng/dL (ref 0.82–1.77)

## 2020-01-03 LAB — B12 AND FOLATE PANEL
Folate: >20 ng/mL (ref >3.0)
Vitamin B-12: >2000 pg/mL — ABNORMAL HIGH (ref 232–1245)

## 2020-01-03 LAB — PHOSPHORUS: Phosphorus: 4.1 mg/dL (ref 3.0–4.3)

## 2020-01-03 LAB — VITAMIN D 25 HYDROXY (VIT D DEFICIENCY, FRACTURES): Vit D, 25-Hydroxy: 20 ng/mL — ABNORMAL LOW (ref 30.0–100.0)

## 2020-01-03 NOTE — Progress Notes (Signed)
Labs look good. Will need to continue with BID dosing of theophylline. Also monthly B12 injections.

## 2020-01-04 ENCOUNTER — Other Ambulatory Visit: Payer: Self-pay | Admitting: Nurse Practitioner

## 2020-01-04 ENCOUNTER — Other Ambulatory Visit: Payer: Self-pay | Admitting: Adult Health

## 2020-01-04 DIAGNOSIS — J4550 Severe persistent asthma, uncomplicated: Secondary | ICD-10-CM

## 2020-01-04 MED ORDER — THEOPHYLLINE ER 300 MG PO TB12: 300.0000 mg | ORAL_TABLET | Freq: Two times a day (BID) | ORAL | 1 refills | Status: DC

## 2020-01-04 NOTE — Telephone Encounter (Signed)
-----   Message from Ronnell Freshwater, NP sent at 01/04/2020 10:27 AM EST ----- Please let the patient know that her labs look good. Heophylline level is a bit low, so twice daily dosing of the theophylline she has now is recommended. We should continue to get her monthly B12 injections. thanks

## 2020-01-04 NOTE — Progress Notes (Signed)
Changed theophylline 300mg  to twice dally. [facility].

## 2020-01-04 NOTE — Progress Notes (Signed)
Please let the patient know that her labs look good. Heophylline level is a bit low, so twice daily dosing of the theophylline she has now is recommended. We should continue to get her monthly B12 injections. thanks

## 2020-01-04 NOTE — Progress Notes (Signed)
Pt advised labs looks good theophylline level is low take med twice a day and also b 12 injection every month and also we send new pres for theophylline

## 2020-01-05 ENCOUNTER — Other Ambulatory Visit: Payer: Self-pay

## 2020-01-05 DIAGNOSIS — J4550 Severe persistent asthma, uncomplicated: Secondary | ICD-10-CM

## 2020-01-05 MED ORDER — THEOPHYLLINE ER 300 MG PO TB12: 300.0000 mg | ORAL_TABLET | Freq: Two times a day (BID) | ORAL | 1 refills | Status: DC

## 2020-01-06 ENCOUNTER — Ambulatory Visit: Payer: BC Managed Care – PPO | Admitting: Cardiovascular Disease

## 2020-01-06 ENCOUNTER — Ambulatory Visit (INDEPENDENT_AMBULATORY_CARE_PROVIDER_SITE_OTHER): Payer: BC Managed Care – PPO | Admitting: Cardiovascular Disease

## 2020-01-06 ENCOUNTER — Encounter: Payer: Self-pay | Admitting: Cardiovascular Disease

## 2020-01-06 ENCOUNTER — Other Ambulatory Visit: Payer: Self-pay

## 2020-01-06 ENCOUNTER — Telehealth: Payer: Self-pay

## 2020-01-06 VITALS — BP 130/80 | HR 101 | Ht 64.5 in | Wt 275.5 lb

## 2020-01-06 DIAGNOSIS — R0602 Shortness of breath: Secondary | ICD-10-CM | POA: Diagnosis not present

## 2020-01-06 DIAGNOSIS — I482 Chronic atrial fibrillation, unspecified: Secondary | ICD-10-CM | POA: Diagnosis not present

## 2020-01-06 DIAGNOSIS — I10 Essential (primary) hypertension: Secondary | ICD-10-CM

## 2020-01-06 MED ORDER — METOPROLOL TARTRATE 25 MG PO TABS: 25.0000 mg | ORAL_TABLET | Freq: Two times a day (BID) | ORAL | 2 refills | Status: DC

## 2020-01-06 MED ORDER — DILTIAZEM HCL 60 MG PO TABS: 60.0000 mg | ORAL_TABLET | Freq: Three times a day (TID) | ORAL | 3 refills | Status: DC

## 2020-01-06 NOTE — Patient Instructions (Signed)
Medication Instructions:  Your physician has recommended you make the following change in your medication:   1) STOP Diltiazem CD  2) START Diltiazem 30 mg three times daily. An Rx has been sent to your pharmacy.  3) START Metoprolol tartrate 25 mg twice daily. An Rx has been sent to your pharmacy.  *If you need a refill on your cardiac medications before your next appointment, please call your pharmacy*  Lab Work: None ordered If you have labs (blood work) drawn today and your tests are completely normal, you will receive your results only by: Marland Kitchen MyChart Message (if you have MyChart) OR . A paper copy in the mail If you have any lab test that is abnormal or we need to change your treatment, we will call you to review the results.  Testing/Procedures: Your physician has requested that you have an echocardiogram. Echocardiography is a painless test that uses sound waves to create images of your heart. It provides your doctor with information about the size and shape of your heart and how well your heart's chambers and valves are working. This procedure takes approximately one hour. There are no restrictions for this procedure.    Follow-Up: At [facility], you and your health needs are our priority.  As part of our continuing mission to provide you with exceptional heart care, we have created designated Provider Care Teams.  These Care Teams include your primary Cardiologist (physician) and Advanced Practice Providers (APPs -  Physician Assistants and Nurse Practitioners) who all work together to provide you with the care you need, when you need it.  Your next appointment:   After the echo   The format for your next appointment:   In Person  Provider:    You may see Kathlyn Sacramento, MD or one of the following Advanced Practice Providers on your designated Care Team:    Murray Hodgkins, NP  Christell Faith, PA-C  Marrianne Mood, PA-C   Other Instructions  Echocardiogram An  echocardiogram is a procedure that uses painless sound waves (ultrasound) to produce an image of the heart. Images from an echocardiogram can provide important information about:  Signs of coronary artery disease (CAD).  Aneurysm detection. An aneurysm is a weak or damaged part of an artery wall that bulges out from the normal force of blood pumping through the body.  Heart size and shape. Changes in the size or shape of the heart can be associated with certain conditions, including heart failure, aneurysm, and CAD.  Heart muscle function.  Heart valve function.  Signs of a past heart attack.  Fluid buildup around the heart.  Thickening of the heart muscle.  A tumor or infectious growth around the heart valves. Tell a health care provider about:  Any allergies you have.  All medicines you are taking, including vitamins, herbs, eye drops, creams, and over-the-counter medicines.  Any blood disorders you have.  Any surgeries you have had.  Any medical conditions you have.  Whether you are pregnant or may be pregnant. What are the risks? Generally, this is a safe procedure. However, problems may occur, including:  Allergic reaction to dye (contrast) that may be used during the procedure. What happens before the procedure? No specific preparation is needed. You may eat and drink normally. What happens during the procedure?   An IV tube may be inserted into one of your veins.  You may receive contrast through this tube. A contrast is an injection that improves the quality of the pictures  from your heart.  A gel will be applied to your chest.  A wand-like tool (transducer) will be moved over your chest. The gel will help to transmit the sound waves from the transducer.  The sound waves will harmlessly bounce off of your heart to allow the heart images to be captured in real-time motion. The images will be recorded on a computer. The procedure may vary among health care  providers and hospitals. What happens after the procedure?  You may return to your normal, everyday life, including diet, activities, and medicines, unless your health care provider tells you not to do that. Summary  An echocardiogram is a procedure that uses painless sound waves (ultrasound) to produce an image of the heart.  Images from an echocardiogram can provide important information about the size and shape of your heart, heart muscle function, heart valve function, and fluid buildup around your heart.  You do not need to do anything to prepare before this procedure. You may eat and drink normally.  After the echocardiogram is completed, you may return to your normal, everyday life, unless your health care provider tells you not to do that. This information is not intended to replace advice given to you by your health care provider. Make sure you discuss any questions you have with your health care provider. Document Revised: 03/31/2019 Document Reviewed: 01/10/2017 Elsevier Patient Education  [employer].

## 2020-01-06 NOTE — Telephone Encounter (Signed)
ORDERS SIGNED AND PLACED IN [facility].

## 2020-01-06 NOTE — Progress Notes (Signed)
Cardiology Office Note   Date:  01/06/2020   ID:  Laura, [name] 04/07/1951, MRN [id number]  PCP:  [name], MD  Cardiologist:   Kathlyn Sacramento, MD   Chief Complaint  Patient presents with  . office visit    6 month F/U; Meds verbally reviewed with patient.      History of Present Illness: Laura [name] is a 69 y.o. female who is here today for a follow-up visit regarding chronic atrial fibrillation. The patient has chronic medical conditions that include obesity, sleep apnea on CPAP, asthma and essential hypertension. She was diagnosed with atrial fibrillation in October 2017.  She underwent a pharmacologic nuclear stress test which showed no evidence of ischemia with ejection fraction of 53%. She had an echocardiogram done which showed mildly reduced LV systolic function with an EF of 50%, mild LVH, moderately dilated left atrium, moderate mitral and tricuspid regurgitation with mild pulmonary hypertension. She did not tolerate multiple antiarrhythmic medications and has been treated with rate control. She was hospitalized in September with ischemic colitis.  Angiogram showed no acute obstructive disease.  She underwent ileum and right colon resection.  Rate control was continued for atrial fibrillation and ultimately Eliquis was resumed. Since surgery, she has been having issues with absorption and she was told not to use extended release medications.  She feels more out of breath with increased leg edema.  No chest pain.  Past Medical History:  Diagnosis Date  . Asthma   . Chronic atrial fibrillation ([place])    a. diagnosed in 09/2016; b. failed flecainide and propafenone due to LE swelling and SOB, could not afford Multaq; c. CHADS2VASc => 3 (HTN, age x 1, female); d. on Eliquis  . GERD (gastroesophageal reflux disease)   . History of stress test    a. Lexiscan Myoview 10/2016: no evidence of ischemia, EF 53%  . Hypertension   . Obesity   . Obstructive sleep  apnea   . Pulmonary hypertension ([place])   . Systolic dysfunction    a. TTE 10/2016: EF 50%, mild LVH, moderately dilated LA, moderate MR/TR, mild pulmonary hypertension    Past Surgical History:  Procedure Laterality Date  . BOWEL RESECTION  09/11/2019   Procedure: SMALL BOWEL RESECTION;  Surgeon: Herbert Pun, MD;  Location: [facility];  Service: General;;  . cataract surgery    . INCISION AND DRAINAGE ABSCESS Right 06/29/2016   Procedure: INCISION AND DRAINAGE ABSCESS;  Surgeon: Florene Glen, MD;  Location: [facility];  Service: General;  Laterality: Right;  . INCISION AND DRAINAGE OF WOUND Left 06/29/2016   Procedure: IRRIGATION AND DEBRIDEMENT WOUND;  Surgeon: Florene Glen, MD;  Location: [facility];  Service: General;  Laterality: Left;  . LAPAROSCOPIC RIGHT COLECTOMY  09/11/2019   Procedure: RIGHT COLECTOMY;  Surgeon: Herbert Pun, MD;  Location: [facility];  Service: General;;  . LAPAROSCOPY N/A 09/11/2019   Procedure: LAPAROSCOPY DIAGNOSTIC;  Surgeon: Herbert Pun, MD;  Location: [facility];  Service: General;  Laterality: N/A;  . LAPAROTOMY N/A 09/13/2019   Procedure: REOPENING OF RECENT LAPAROTOMYANASTOMOSIS OF BOWEL;  Surgeon: Herbert Pun, MD;  Location: [facility];  Service: General;  Laterality: N/A;  . VISCERAL ANGIOGRAPHY N/A 09/12/2019   Procedure: VISCERAL ANGIOGRAPHY;  Surgeon: Algernon Huxley, MD;  Location: [facility];  Service: Cardiovascular;  Laterality: N/A;     Current Outpatient Medications  Medication Sig Dispense Refill  . albuterol (VENTOLIN HFA) 108 (90 Base) MCG/ACT inhaler  Inhale 2 puffs into the lungs every 4 (four) hours as needed for wheezing or shortness of breath. 18 g 3  . ALPRAZolam (XANAX) 0.25 MG tablet Take 1 tablet (0.25 mg total) by mouth 2 (two) times daily. 45 tablet 3  . apixaban (ELIQUIS) 5 MG TABS tablet Take 1 tablet (5 mg total) by mouth 2 (two) times daily. 60 tablet 8  . dexlansoprazole (DEXILANT) 60 MG  capsule Take 1 capsule (60 mg total) by mouth daily. 90 capsule 0  . diltiazem (CARDIZEM CD) 360 MG 24 hr capsule Take 1 capsule (360 mg total) by mouth daily. 90 capsule 0  . EPINEPHrine 0.3 mg/0.3 mL IJ SOAJ injection Inject 0.3 mg into the muscle as needed for anaphylaxis.     . furosemide (LASIX) 40 MG tablet Take 1 tablet (40 mg total) by mouth daily. 90 tablet 1  . ipratropium-albuterol (DUONEB) 0.5-2.5 (3) MG/3ML SOLN Take 3 mLs by nebulization every 4 (four) hours as needed. (Patient taking differently: Take 3 mLs by nebulization every 4 (four) hours as needed (shortness of breath / wheezing). ) 120 mL 3  . levalbuterol (XOPENEX) 0.31 MG/3ML nebulizer solution Take 3 mLs by nebulization every 4 (four) hours as needed. For wheezing/ and or shortness of breath.    . losartan (COZAAR) 25 MG tablet Take 1 tablet (25 mg total) by mouth daily. 90 tablet 2  . metaxalone (SKELAXIN) 800 MG tablet metaxalone 800 mg tablet  Take 1 tablet every 8 hours by oral route.    . montelukast (SINGULAIR) 10 MG tablet TAKE 1 TABLET BY MOUTH EVERY DAY (Patient taking differently: Take 10 mg by mouth at bedtime. ) 90 tablet 3  . Multiple Vitamin (MULTIVITAMIN) tablet Take 1 tablet by mouth daily.    . ondansetron (ZOFRAN) 4 MG tablet Take 1 tablet (4 mg total) by mouth every 8 (eight) hours as needed for nausea or vomiting. 60 tablet 1  . predniSONE (DELTASONE) 10 MG tablet Take 1 tablet (10 mg total) by mouth daily with breakfast. 30 tablet 5  . Probiotic Product (PROBIOTIC-10) CAPS Take 1 capsule by mouth daily.     Marland Kitchen spironolactone (ALDACTONE) 25 MG tablet Take 1 tablet (25 mg total) by mouth 2 (two) times daily. 180 tablet 0  . theophylline (THEODUR) 300 MG 12 hr tablet Take 1 tablet (300 mg total) by mouth 2 (two) times daily. 180 tablet 1   Current Facility-Administered Medications  Medication Dose Route Frequency Provider Last Rate Last Admin  . cyanocobalamin ((VITAMIN B-12)) injection 1,000 mcg  1,000  mcg Intramuscular Once Ronnell Freshwater, NP        Allergies:   Flecainide, Propafenone, and Rivaroxaban    Social History:  The patient  reports that she has never smoked. She has never used smokeless tobacco. She reports that she does not drink alcohol or use drugs.   Family History:  The patient's family history includes COPD in her father; Dementia in her mother; Osteoporosis in her mother; Vascular Disease in her mother.    ROS:  Please see the history of present illness.   Otherwise, review of systems are positive for none.   All other systems are reviewed and negative.    PHYSICAL EXAM: VS:  BP 130/80 (BP Location: Left Arm, Patient Position: Sitting, Cuff Size: Large)   Pulse (!) 101   Ht 5' 4.5" (1.638 m)   Wt 275 lb 8 oz (125 kg)   SpO2 98%   BMI 46.56 kg/m  ,  BMI Body mass index is 46.56 kg/m. GEN: Well nourished, well developed, in no acute distress  HEENT: normal  Neck: no JVD, carotid bruits, or masses Cardiac: Irregularly irregular and mildly tachycardic; no murmurs, rubs, or gallops, +2 edema Respiratory:  clear to auscultation bilaterally, normal work of breathing GI: soft, nontender, nondistended, + BS MS: no deformity or atrophy  Skin: warm and dry, no rash Neuro:  Strength and sensation are intact Psych: euthymic mood, full affect   EKG:  EKG is ordered today. The ekg ordered today demonstrates atrial fibrillation with ventricular rate of 101 bpm.  Low voltage    Recent Labs: 09/12/2019: ALT 17 01/02/2020: BUN 13; Creatinine, Ser 0.88; Hemoglobin 10.7; Magnesium 2.1; Platelets 346; Potassium 4.1; Sodium 143; TSH 1.810    Lipid Panel    Component Value Date/Time   CHOL 189 09/10/2018 0741   TRIG 104 09/10/2018 0741   HDL 55 09/10/2018 0741   CHOLHDL 3.4 09/10/2018 0741   LDLCALC 113 (H) 09/10/2018 0741      Wt Readings from Last 3 Encounters:  01/06/20 275 lb 8 oz (125 kg)  01/02/20 269 lb 12.8 oz (122.4 kg)  12/05/19 263 lb (119.3 kg)       PAD Screen 04/03/2017  Previous PAD dx? No  Previous surgical procedure? No  Pain with walking? No  Feet/toe relief with dangling? No  Painful, non-healing ulcers? No  Extremities discolored? No      ASSESSMENT AND PLAN:  1.  Chronic atrial fibrillation: Ventricular rate does not seem to be well controlled on long release diltiazem which could be related to an absorption issue related to recent bowel resection.  I am going to see if she can tolerate metoprolol but we have to start with a small dose given history of asthma.  I started metoprolol 25 mg twice daily.  Given issues with not absorbing long release medications, I am switching her to diltiazem 60 mg 3 times daily.  Continue anticoagulation with Eliquis.  If she tolerates metoprolol, the plan is to uptitrate the dose and discontinue diltiazem especially with significant bilateral leg edema. Given worsening exertional dyspnea and leg edema, I requested an echocardiogram.  2. Sleep apnea: Continue treatment with CPAP.  3. Essential hypertension: Blood pressure is reasonably controlled.    Disposition:   FU with me in  1 month. Signed,  Kathlyn Sacramento, MD  01/06/2020 9:06 AM    [place]

## 2020-01-09 ENCOUNTER — Encounter: Payer: Self-pay | Admitting: Cardiovascular Disease

## 2020-01-12 ENCOUNTER — Other Ambulatory Visit: Payer: Self-pay | Admitting: Adult Health

## 2020-01-12 ENCOUNTER — Telehealth: Payer: Self-pay | Admitting: Cardiovascular Disease

## 2020-01-12 DIAGNOSIS — J4550 Severe persistent asthma, uncomplicated: Secondary | ICD-10-CM

## 2020-01-12 NOTE — Telephone Encounter (Signed)
Pt c/o medication issue:  1. Name of Medication: metoprolol  2. How are you currently taking this medication (dosage and times per day)? 25 mg bid. Stopped taking it 1/20  3. Are you having a reaction (difficulty breathing--STAT)? SOB, itching, swelling in legs, trouble breathing and dizzy  4. What is your medication issue? Had to miss work due to all listed reactions

## 2020-01-12 NOTE — Telephone Encounter (Signed)
Stop metoprolol and increase diltiazem to 90 mg 3 times daily.

## 2020-01-12 NOTE — Telephone Encounter (Signed)
Spoke with the patient. Dr. Fletcher Anon started the patient on Metoprolol 25mg  bid for rate control on 01/06/20. Patient sts that she was unable to tolerate and took her last dose yesterday morning 01/11/20.  Patient complained increased sob, fatigue, dizziness and itching of the skin. She has had to take multiple nebulizer treatments since starting the medication. She stayed home from work yesterday because she felt so bad. She has taken a antihistamine  for the itching at it was relieved. She denies rash or swelling of the face, tongue, or lips.  She feels a little better today. The sob has improved. She is unable to provide current VS. She is taking the short acting Diltiazem as prescribed.  Advised the patient that I will fwd the update to Dr. Fletcher Anon and call back with his recommendation. Patient voiced appreciation for the assistance.

## 2020-01-13 MED ORDER — DILTIAZEM HCL 90 MG PO TABS: 90.0000 mg | ORAL_TABLET | Freq: Three times a day (TID) | ORAL | 5 refills | Status: DC

## 2020-01-13 NOTE — Telephone Encounter (Signed)
Patient made aware of Dr. Tyrell Antonio response and recommendation. Rx for Diltiazem 90mg  tid sent to the patients pharmacy. Metoprolol place on the patient allergy list as an intolerance. Patient is agreeable with the plan and voiced appreciation for the assiatnce.

## 2020-01-13 NOTE — Telephone Encounter (Signed)
Called to give the patient Dr. Arida's response and recommendation. [email or web address]. 

## 2020-01-16 ENCOUNTER — Telehealth: Payer: Self-pay

## 2020-01-16 NOTE — Progress Notes (Deleted)
Cardiology Office Note    Date:  01/16/2020   ID:  Murphee, Toupin 12-20-1951, MRN [id number]  PCP:  [name], MD  Cardiologist:  Kathlyn Sacramento, MD  Electrophysiologist:  None   Chief Complaint: Follow-up  History of Present Illness:   Laura [name] is a 69 y.o. female with history of permanent A. fib on Eliquis, systolic dysfunction, pulmonary hypertension, ischemic colitis, COVID-19 infection in 09/2019, hypertension, obesity, sleep apnea on CPAP, asthma, and GERD who presents for follow-up of echo.  She was diagnosed with A. fib in 09/2016 and has failed flecainide and propafenone secondary to lower extremity swelling and SOB.  She was unable to afford Multaq.  Lexiscan Myoview in 10/2016 showed no evidence of ischemia with an EF of 53%.  Echo at that time showed an EF of 50%, mild LVH, moderately dilated left atrium, moderate mitral and tricuspid regurgitation, and mild pulmonary hypertension.  She was admitted in 08/2019 with ischemic colitis.  Angiogram showed no acute obstructive disease.  She underwent ileum and right colonic resection.  Her A. fib was rate controlled and she was ultimately restarted on Eliquis postoperatively.  She tested positive for COVID-19 in 09/2019.  She did not require hospital admission.  She was seen on 01/06/2020 and noted since her surgery, she had been having issues with absorption and was told not to use extended release medications.  She felt more dyspneic and noted increased lower extremity swelling.  Her weight at that time was 275 pounds, which was up from 247 pounds at her visit in 03/2018.  She was entered to be mildly tachycardic which was felt to possibly be related to decreased absorption with long-acting medication.  She was started on low-dose metoprolol 25 mg twice daily, in the setting of her asthma.  She was also transitioned to short acting diltiazem 60 mg 3 times daily.  Given her worsening exertional dyspnea and leg edema echo was  recommended and performed on 01/19/2020 and showed ***.  ***   Labs independently reviewed: 12/2019 - magnesium 2.1, TSH normal, A1c 5.9, potassium 4.1, BUN 13, serum creatinine 0.88, Hgb 10.7, PLT 346 08/2019 - albumin 3.5, AST/ALT normal 08/2018 - TC 189, TG 104, HDL 55, LDL 113  Past Medical History:  Diagnosis Date  . Asthma   . Chronic atrial fibrillation ([place])    a. diagnosed in 09/2016; b. failed flecainide and propafenone due to LE swelling and SOB, could not afford Multaq; c. CHADS2VASc => 3 (HTN, age x 1, female); d. on Eliquis  . GERD (gastroesophageal reflux disease)   . History of stress test    a. Lexiscan Myoview 10/2016: no evidence of ischemia, EF 53%  . Hypertension   . Obesity   . Obstructive sleep apnea   . Pulmonary hypertension ([place])   . Systolic dysfunction    a. TTE 10/2016: EF 50%, mild LVH, moderately dilated LA, moderate MR/TR, mild pulmonary hypertension    Past Surgical History:  Procedure Laterality Date  . BOWEL RESECTION  09/11/2019   Procedure: SMALL BOWEL RESECTION;  Surgeon: Herbert Pun, MD;  Location: [facility];  Service: General;;  . cataract surgery    . INCISION AND DRAINAGE ABSCESS Right 06/29/2016   Procedure: INCISION AND DRAINAGE ABSCESS;  Surgeon: Florene Glen, MD;  Location: [facility];  Service: General;  Laterality: Right;  . INCISION AND DRAINAGE OF WOUND Left 06/29/2016   Procedure: IRRIGATION AND DEBRIDEMENT WOUND;  Surgeon: Florene Glen, MD;  Location:  [facility];  Service: General;  Laterality: Left;  . LAPAROSCOPIC RIGHT COLECTOMY  09/11/2019   Procedure: RIGHT COLECTOMY;  Surgeon: Herbert Pun, MD;  Location: [facility];  Service: General;;  . LAPAROSCOPY N/A 09/11/2019   Procedure: LAPAROSCOPY DIAGNOSTIC;  Surgeon: Herbert Pun, MD;  Location: [facility];  Service: General;  Laterality: N/A;  . LAPAROTOMY N/A 09/13/2019   Procedure: REOPENING OF RECENT LAPAROTOMYANASTOMOSIS OF BOWEL;  Surgeon: Herbert Pun, MD;  Location: [facility];  Service: General;  Laterality: N/A;  . VISCERAL ANGIOGRAPHY N/A 09/12/2019   Procedure: VISCERAL ANGIOGRAPHY;  Surgeon: Algernon Huxley, MD;  Location: [facility];  Service: Cardiovascular;  Laterality: N/A;    Current Medications: No outpatient medications have been marked as taking for the 01/23/20 encounter (Appointment) with Rise Mu, PA-C.   Current Facility-Administered Medications for the 01/23/20 encounter (Appointment) with Rise Mu, PA-C  Medication  . cyanocobalamin ((VITAMIN B-12)) injection 1,000 mcg    Allergies:   Flecainide, Metoprolol, Propafenone, and Rivaroxaban   Social History   Socioeconomic History  . Marital status: Married    Spouse name: Not on file  . Number of children: Not on file  . Years of education: Not on file  . Highest education level: Not on file  Occupational History  . Not on file  Tobacco Use  . Smoking status: Never Smoker  . Smokeless tobacco: Never Used  Substance and Sexual Activity  . Alcohol use: No  . Drug use: No  . Sexual activity: Not on file  Other Topics Concern  . Not on file  Social History Narrative  . Not on file   Social Determinants of Health   Financial Resource Strain:   . Difficulty of Paying Living Expenses: Not on file  Food Insecurity:   . Worried About Charity fundraiser in the Last Year: Not on file  . Ran Out of Food in the Last Year: Not on file  Transportation Needs:   . Lack of Transportation (Medical): Not on file  . Lack of Transportation (Non-Medical): Not on file  Physical Activity:   . Days of Exercise per Week: Not on file  . Minutes of Exercise per Session: Not on file  Stress:   . Feeling of Stress : Not on file  Social Connections:   . Frequency of Communication with Friends and Family: Not on file  . Frequency of Social Gatherings with Friends and Family: Not on file  . Attends Religious Services: Not on file  . Active Member of Clubs or  Organizations: Not on file  . Attends Archivist Meetings: Not on file  . Marital Status: Not on file     Family History:  The patient's family history includes COPD in her father; Dementia in her mother; Osteoporosis in her mother; Vascular Disease in her mother.  ROS:   ROS   EKGs/Labs/Other Studies Reviewed:    Studies reviewed were summarized above. The additional studies were reviewed today: ***  EKG:  EKG is ordered today.  The EKG ordered today demonstrates ***  Recent Labs: 09/12/2019: ALT 17 01/02/2020: BUN 13; Creatinine, Ser 0.88; Hemoglobin 10.7; Magnesium 2.1; Platelets 346; Potassium 4.1; Sodium 143; TSH 1.810  Recent Lipid Panel    Component Value Date/Time   CHOL 189 09/10/2018 0741   TRIG 104 09/10/2018 0741   HDL 55 09/10/2018 0741   CHOLHDL 3.4 09/10/2018 0741   LDLCALC 113 (H) 09/10/2018 0741    PHYSICAL EXAM:  VS:  There were no vitals taken for this visit.  BMI: There is no height or weight on file to calculate BMI.  Physical Exam  Wt Readings from Last 3 Encounters:  01/06/20 275 lb 8 oz (125 kg)  01/02/20 269 lb 12.8 oz (122.4 kg)  12/05/19 263 lb (119.3 kg)     ASSESSMENT & PLAN:   1. ***  Disposition: F/u with Dr. Fletcher Anon or an APP in ***.   Medication Adjustments/Labs and Tests Ordered: Current medicines are reviewed at length with the patient today.  Concerns regarding medicines are outlined above. Medication changes, Labs and Tests ordered today are summarized above and listed in the Patient Instructions accessible in Encounters.   Signed, Christell Faith, PA-C 01/16/2020 2:22 PM     [address] [place], [place] [address] [phone]

## 2020-01-16 NOTE — Telephone Encounter (Signed)
Called lmom informing patient need to schedule follow pulm visit. klh

## 2020-01-19 ENCOUNTER — Encounter: Payer: Self-pay | Admitting: *Deleted

## 2020-01-19 ENCOUNTER — Ambulatory Visit (INDEPENDENT_AMBULATORY_CARE_PROVIDER_SITE_OTHER): Payer: BC Managed Care – PPO

## 2020-01-19 ENCOUNTER — Other Ambulatory Visit: Payer: Self-pay

## 2020-01-19 DIAGNOSIS — R0602 Shortness of breath: Secondary | ICD-10-CM

## 2020-01-20 ENCOUNTER — Telehealth: Payer: Self-pay

## 2020-01-20 MED ORDER — DILTIAZEM HCL ER COATED BEADS 360 MG PO CP24: 360.0000 mg | ORAL_CAPSULE | Freq: Every day | ORAL | 5 refills | Status: DC

## 2020-01-20 MED ORDER — DIGOXIN 250 MCG PO TABS: 0.1250 mg | ORAL_TABLET | Freq: Every day | ORAL | 5 refills | Status: DC

## 2020-01-20 NOTE — Telephone Encounter (Signed)
Called to give the patient echo results and Dr. Tyrell Antonio recommendations. [email or web address].

## 2020-01-20 NOTE — Telephone Encounter (Addendum)
Patient made aware of echo results and Dr. Tyrell Antonio recommendation. Patient is agreeable with the plan of care. Pt will d/c short acting Diltiazem. Rx sent to the patients pharmacy for Diltiazem CD 360 mg and Digoxin 0.125mg  daily.  Patient is scheduled to see Christell Faith, PA on 21/21. Pt rqst to cancel the appt. Pt would like the appt to be pushed out a couple of weeks to see how she is doing on the new medications. Advised the patient that I agreed. Appt cancelled.  Advised the patient that I will fwd the update to Dr. Fletcher Anon so that he can advise when the pt should be rescheduled. Pt agreeable with the plan and voiced appreciation.

## 2020-01-20 NOTE — Telephone Encounter (Signed)
-----   Message from Wellington Hampshire, MD sent at 01/20/2020 12:59 PM EST ----- Inform patient that echo showed mildly reduced ejection fraction .  This is likely due to uncontrolled atrial fibrillation as her heart rate was 130 bpm during the study.  I think her heart rate was more controlled on the extended release diltiazem and I recommend switching her back to diltiazem CD 360 mg once daily from the short acting diltiazem.  I also recommend adding small dose digoxin 0.125 mg once daily to improve her heart rate.  Keep follow-up as planned.

## 2020-01-20 NOTE — Telephone Encounter (Signed)
Patient returning voicemail, will be reachable now

## 2020-01-23 ENCOUNTER — Telehealth: Payer: Self-pay | Admitting: Cardiovascular Disease

## 2020-01-23 ENCOUNTER — Ambulatory Visit: Payer: BC Managed Care – PPO | Admitting: Physician Assistant

## 2020-01-23 NOTE — Telephone Encounter (Signed)
Follow up in 3 to 4 weeks 

## 2020-01-23 NOTE — Telephone Encounter (Signed)
Patient calling in saying that there is an issue with the pharmacy. Patient states she needs to be on diazepam, however the pharmacist is saying there would be a reaction.  Patient would like to be called as well as the pharmacist

## 2020-01-24 NOTE — Telephone Encounter (Signed)
Patient calling back in regards to another encounter. I went ahead and scheduled follow up for patient.

## 2020-01-24 NOTE — Telephone Encounter (Signed)
Called pharmacy. Ryan, PharmD, wanted to make sure patient's digoxin levels would be monitored because the diltiazem can cause the digoxin levels to increase. Advised that patient has follow up in about 4 weeks and can be addressed with provider at that time.  He will go ahead and fill medications and let patient know when ready.  Patient made aware of this and will let us know if she has any symptoms such as nausea, vomiting, or confusion.  Patient seeing Thurmond Butts on 03/02/20. Routing to Dr Fletcher Anon as Juluis Rainier.

## 2020-01-24 NOTE — Telephone Encounter (Signed)
No answer. Left message to call back.   

## 2020-01-24 NOTE — Telephone Encounter (Signed)
Please call patient to schedule 3-4 week follow up per Dr Fletcher Anon. Thanks!

## 2020-01-25 ENCOUNTER — Other Ambulatory Visit: Payer: Self-pay

## 2020-01-25 ENCOUNTER — Ambulatory Visit (INDEPENDENT_AMBULATORY_CARE_PROVIDER_SITE_OTHER): Payer: BC Managed Care – PPO | Admitting: Gastroenterology

## 2020-01-25 DIAGNOSIS — R197 Diarrhea, unspecified: Secondary | ICD-10-CM | POA: Diagnosis not present

## 2020-01-25 MED ORDER — CHOLESTYRAMINE 4 G PO PACK: 4.0000 g | PACK | Freq: Two times a day (BID) | ORAL | 5 refills | Status: DC

## 2020-01-25 NOTE — Progress Notes (Signed)
[name] MD, MRCP(U.K) [address]  [place]  [place], [place] [address]  Main: [phone]  Fax: [phone]   Gastroenterology Consultation  Referring Provider:     [name], MD Primary Care Physician:  [name], MD Primary Gastroenterologist:  Dr. [name]  Reason for Consultation:     Diarrhea        HPI:   Laura [name] is a 69 y.o. y/o female referred for consultation & management  by Dr. Humphrey Rolls, Timoteo Gaul, MD.    She is here to see me for diarrhea.  Prior gastroenterology notes and results reviewed and summarized.  She has previously been a patient of [facility] gastroenterology and was last seen at their office in September 2020.  She carries a diagnosis of collagenous colitis noted in 2017.  It appears she was hospitalized in November 2020 and underwent resection of the small bowel due to ischemia.  During the surgery she underwent ileum and right colon resection.  Had a second look surgery.  Looking back at colon biopsy results from 2013 the biopsy results and not very specific for collagenous colitis.  09/18/2019: CT scan of the abdomen shows status post distal small bowel resection with right hemicolectomy and enterocolonic anastomosis.  01/02/2020: Ferritin 25.  B12 and folate normal.  Hemoglobin 10.7 g in January 2021 with an MCV of 85.  He says that prior to the surgery she was having features of constipation alternating with diarrhea.  Since the surgery she has been having only diarrhea up to 10-15 times a day.  At times she has accidents and she has to wear a diaper to prevent soiling her undergarments.  She denies any use of any artificial sugars, NSAIDs, Metformin.  She wishes to transfer care to our care she was on a PPI which has been stopped.  Past Medical History:  Diagnosis Date  . Asthma   . Chronic atrial fibrillation ([place])    a. diagnosed in 09/2016; b. failed flecainide and propafenone due to LE swelling and SOB, could not afford Multaq; c.  CHADS2VASc => 3 (HTN, age x 1, female); d. on Eliquis  . GERD (gastroesophageal reflux disease)   . History of stress test    a. Lexiscan Myoview 10/2016: no evidence of ischemia, EF 53%  . Hypertension   . Obesity   . Obstructive sleep apnea   . Pulmonary hypertension ([place])   . Systolic dysfunction    a. TTE 10/2016: EF 50%, mild LVH, moderately dilated LA, moderate MR/TR, mild pulmonary hypertension    Past Surgical History:  Procedure Laterality Date  . BOWEL RESECTION  09/11/2019   Procedure: SMALL BOWEL RESECTION;  Surgeon: Herbert Pun, MD;  Location: [facility] ORS;  Service: General;;  . cataract surgery    . INCISION AND DRAINAGE ABSCESS Right 06/29/2016   Procedure: INCISION AND DRAINAGE ABSCESS;  Surgeon: Florene Glen, MD;  Location: [facility] ORS;  Service: General;  Laterality: Right;  . INCISION AND DRAINAGE OF WOUND Left 06/29/2016   Procedure: IRRIGATION AND DEBRIDEMENT WOUND;  Surgeon: Florene Glen, MD;  Location: [facility] ORS;  Service: General;  Laterality: Left;  . LAPAROSCOPIC RIGHT COLECTOMY  09/11/2019   Procedure: RIGHT COLECTOMY;  Surgeon: Herbert Pun, MD;  Location: [facility] ORS;  Service: General;;  . LAPAROSCOPY N/A 09/11/2019   Procedure: LAPAROSCOPY DIAGNOSTIC;  Surgeon: Herbert Pun, MD;  Location: [facility] ORS;  Service: General;  Laterality: N/A;  . LAPAROTOMY N/A 09/13/2019   Procedure: REOPENING OF  RECENT LAPAROTOMYANASTOMOSIS OF BOWEL;  Surgeon: Herbert Pun, MD;  Location: [facility] ORS;  Service: General;  Laterality: N/A;  . VISCERAL ANGIOGRAPHY N/A 09/12/2019   Procedure: VISCERAL ANGIOGRAPHY;  Surgeon: Algernon Huxley, MD;  Location: [facility];  Service: Cardiovascular;  Laterality: N/A;    Prior to Admission medications   Medication Sig Start Date End Date Taking? Authorizing Provider  albuterol (VENTOLIN HFA) 108 (90 Base) MCG/ACT inhaler Inhale 2 puffs into the lungs every 4 (four) hours as needed for wheezing or shortness  of breath. 07/07/19   Ronnell Freshwater, NP  ALPRAZolam (XANAX) 0.25 MG tablet Take 1 tablet (0.25 mg total) by mouth 2 (two) times daily. 07/07/19   Ronnell Freshwater, NP  apixaban (ELIQUIS) 5 MG TABS tablet Take 1 tablet (5 mg total) by mouth 2 (two) times daily. 10/06/19   Wellington Hampshire, MD  dexlansoprazole (DEXILANT) 60 MG capsule Take 1 capsule (60 mg total) by mouth daily. 12/28/19   Ronnell Freshwater, NP  digoxin (LANOXIN) 0.25 MG tablet Take 0.5 tablets (0.125 mg total) by mouth daily. 01/20/20   Wellington Hampshire, MD  diltiazem (CARDIZEM CD) 360 MG 24 hr capsule Take 1 capsule (360 mg total) by mouth daily. 01/20/20   Wellington Hampshire, MD  EPINEPHrine 0.3 mg/0.3 mL IJ SOAJ injection Inject 0.3 mg into the muscle as needed for anaphylaxis.     [provider]  furosemide (LASIX) 40 MG tablet Take 1 tablet (40 mg total) by mouth daily. 10/26/19   Ronnell Freshwater, NP  ipratropium-albuterol (DUONEB) 0.5-2.5 (3) MG/3ML SOLN Take 3 mLs by nebulization every 4 (four) hours as needed. Patient taking differently: Take 3 mLs by nebulization every 4 (four) hours as needed (shortness of breath / wheezing).  02/03/19   Ronnell Freshwater, NP  levalbuterol (XOPENEX) 0.31 MG/3ML nebulizer solution Take 3 mLs by nebulization every 4 (four) hours as needed. For wheezing/ and or shortness of breath. 02/05/16   [provider]  losartan (COZAAR) 25 MG tablet Take 1 tablet (25 mg total) by mouth daily. 07/27/19   Ronnell Freshwater, NP  metaxalone (SKELAXIN) 800 MG tablet metaxalone 800 mg tablet  Take 1 tablet every 8 hours by oral route.    [provider]  montelukast (SINGULAIR) 10 MG tablet TAKE 1 TABLET BY MOUTH EVERY DAY Patient taking differently: Take 10 mg by mouth at bedtime.  04/23/19   Ronnell Freshwater, NP  Multiple Vitamin (MULTIVITAMIN) tablet Take 1 tablet by mouth daily.    [provider]  ondansetron (ZOFRAN) 4 MG tablet Take 1 tablet (4 mg total) by mouth every  8 (eight) hours as needed for nausea or vomiting. 09/30/19   Ronnell Freshwater, NP  predniSONE (DELTASONE) 10 MG tablet TAKE 1 TABLET (10 MG TOTAL) BY MOUTH DAILY WITH BREAKFAST. 01/12/20   Kendell Bane, NP  Probiotic Product (PROBIOTIC-10) CAPS Take 1 capsule by mouth daily.     [provider]  spironolactone (ALDACTONE) 25 MG tablet Take 1 tablet (25 mg total) by mouth 2 (two) times daily. 10/20/19   Ronnell Freshwater, NP  theophylline (THEODUR) 300 MG 12 hr tablet Take 1 tablet (300 mg total) by mouth 2 (two) times daily. 01/05/20   Ronnell Freshwater, NP    Family History  Problem Relation Age of Onset  . Dementia Mother   . Osteoporosis Mother   . Vascular Disease Mother   . COPD Father  Social History   Tobacco Use  . Smoking status: Never Smoker  . Smokeless tobacco: Never Used  Substance Use Topics  . Alcohol use: No  . Drug use: No    Allergies as of 01/25/2020 - Review Complete 01/13/2020  Allergen Reaction Noted  . Flecainide Shortness Of Breath 11/27/2016  . Metoprolol Shortness Of Breath 01/13/2020  . Propafenone Shortness Of Breath and Swelling 12/01/2016  . Rivaroxaban Other (See Comments) 10/23/2016    Review of Systems:    All systems reviewed and negative except where noted in HPI.   Physical Exam:  There were no vitals taken for this visit. No LMP recorded. Patient is postmenopausal. Psych:  Alert and cooperative. Normal mood and affect. General:   Alert,  Well-developed, well-nourished, pleasant and cooperative in NAD Head:  Normocephalic and atraumatic. Eyes:  Sclera clear, no icterus.   Conjunctiva pink. Ears:  Normal auditory acuity. Lungs:  Respirations even and unlabored.  Clear throughout to auscultation.   No wheezes, crackles, or rhonchi. No acute distress. Heart:  Regular rate and rhythm; no murmurs, clicks, rubs, or gallops. Abdomen:  Normal bowel sounds.  No bruits.  Soft, non-tender and non-distended without masses,  hepatosplenomegaly or hernias noted.  No guarding or rebound tenderness.    Extremities:  No clubbing or edema.  No cyanosis. Neurologic:  Alert and oriented x3;  grossly normal neurologically. Psych:  Alert and cooperative. Normal mood and affect.  Imaging Studies: ECHOCARDIOGRAM COMPLETE  Result Date: 01/19/2020   ECHOCARDIOGRAM REPORT   Patient Name:   Laura [name] Date of Exam: 01/19/2020 Medical Rec #:  [id number]        Height:       64.5 in Accession #:    [id number]       Weight:       275.5 lb Date of Birth:  07-Mar-1951         BSA:          2.25 m Patient Age:    58 years         BP:           130/80 mmHg Patient Gender: F                HR:           100 bpm. Exam Location:  [place] Procedure: 2D Echo, Cardiac Doppler and Color Doppler Indications:    I48.2 Chronic atrial fibrillation  History:        Patient has prior history of Echocardiogram examinations, most                 recent 07/29/2019. COPD and PAD, Arrythmias:Atrial Fibrillation,                 Signs/Symptoms:Shortness of Breath and Dyspnea; Risk                 Factors:Hypertension and Non-Smoker. Patient's recent bowel                 surgery 08/2019 is preventing her medication to absorb correctly.                 She has increased SOB with her COPD.  Sonographer:    Salvadore Dom RVT, RDCS (AE), RDMS Referring Phys: [place]  1. Left ventricular ejection fraction, by visual estimation, is 40 to 45%. The left ventricle has mild to moderately decreased function. There is no left ventricular hypertrophy.  2. Global right  ventricle has mildly reduced systolic function.The right ventricular size is mildly enlarged. No increase in right ventricular wall thickness.  3. Left ventricular diastolic parameters are indeterminate.  4. The left ventricle demonstrates global hypokinesis.  5. Left atrial size was severely dilated.  6. The inferior vena cava is dilated in size with <50% respiratory variability,  suggesting right atrial pressure of 15 mmHg.  7. Rhythm is atrial fibrillation, rate 130 bpm. In comparison to the previous echocardiogram(s): EF 60%. FINDINGS  Left Ventricle: Left ventricular ejection fraction, by visual estimation, is 40 to 45%. The left ventricle has mild to moderately decreased function. The left ventricle demonstrates global hypokinesis. There is no left ventricular hypertrophy. Left ventricular diastolic parameters are indeterminate. Normal left atrial pressure. Right Ventricle: The right ventricular size is mildly enlarged. No increase in right ventricular wall thickness. Global RV systolic function is has mildly reduced systolic function. Left Atrium: Left atrial size was severely dilated. Right Atrium: Right atrial size was normal in size Pericardium: There is no evidence of pericardial effusion. Mitral Valve: The mitral valve is normal in structure. Moderate mitral valve regurgitation. No evidence of mitral valve stenosis by observation. Tricuspid Valve: The tricuspid valve is normal in structure. Tricuspid valve regurgitation is moderate-severe. Aortic Valve: The aortic valve is normal in structure. Aortic valve regurgitation is not visualized. The aortic valve is structurally normal, with no evidence of sclerosis or stenosis. Pulmonic Valve: The pulmonic valve was normal in structure. Pulmonic valve regurgitation is not visualized. Pulmonic regurgitation is not visualized. Aorta: The aortic root, ascending aorta and aortic arch are all structurally normal, with no evidence of dilitation or obstruction. Venous: The inferior vena cava is dilated in size with less than 50% respiratory variability, suggesting right atrial pressure of 15 mmHg. IAS/Shunts: No atrial level shunt detected by color flow Doppler. There is no evidence of a patent foramen ovale. No ventricular septal defect is seen or detected. There is no evidence of an atrial septal defect.  LEFT VENTRICLE PLAX 2D LVIDd:          5.11 cm LVIDs:         3.29 cm LV PW:         1.09 cm LV IVS:        0.97 cm LVOT diam:     1.95 cm LV SV:         81 ml LV SV Index:   32.79 LVOT Area:     2.99 cm  LEFT ATRIUM             Index LA diam:        5.30 cm 2.35 cm/m LA Vol (A2C):           56.90 ml/m LA Vol (A4C):           64.00 ml/m LA Biplane Vol:         59.60 ml/m  AORTIC VALVE LVOT Vmax:   115.00 cm/s LVOT Vmean:  65.600 cm/s LVOT VTI:    0.173 m  AORTA Ao Root diam: 3.60 cm Ao Asc diam:  3.40 cm MITRAL VALVE                        TRICUSPID VALVE MV Area (PHT): 5.88 cm             TV Peak grad:   22.8 mmHg MV PHT:        37.41 msec  TV Vmax:        2.39 m/s MV Decel Time: 129 msec MR Peak grad:    122.8 mmHg         SHUNTS MR Mean grad:    84.0 mmHg          Systemic VTI:  0.17 m MR Vmax:         554.00 cm/s        Systemic Diam: 1.95 cm MR Vmean:        436.0 cm/s MR PISA:         2.26 cm MR PISA Eff ROA: 9 mm MR PISA Radius:  0.60 cm MV E velocity: 133.00 cm/s 103 cm/s  Ida Rogue MD Electronically signed by Ida Rogue MD Signature Date/Time: 01/19/2020/6:16:24 PM    Final     Assessment and Plan:   Laura [name] is a 69 y.o. y/o female has been referred for diarrhea.  She underwent a ileal and right colonic resection in September 2020 following an episode of bowel ischemia.  There is a mention of collagenous colitis in her last gastroenterologist notes but by reviewing the pathology report from her last colonoscopy in 2013 the biopsy results do not fit the condition completely. She could be having diarrhea from the ideal resection related to bile salt mediated.  She could also have diarrhea from an underlying condition that is yet to be diagnosed such as microscopic colitis. PPI has been stopped which I agree as it can cause microscopic colitis at times.  Plan 1.  Stop all NSAID use.  No PPI. 2.  Stop all artificial sugars and sweeteners, diet sodas. 3.  Check stool for infection, fecal calprotectin.   Celiac serology 4.  Commence on Questran 1 sachet twice a day.  If no better in a few days have advised her to increase to 3 times a day.  If causing constipation to drop the dose to 1/a day. 5.  Based on her response and results of the above tests will determine the next step.  Options would include a trial of Creon and possibly a colonoscopy with biopsies.  Follow up in 7 to 10 days telephone visit  Dr [name] MD,MRCP(U.K)

## 2020-01-26 ENCOUNTER — Ambulatory Visit: Payer: BC Managed Care – PPO | Admitting: Gastroenterology

## 2020-01-27 ENCOUNTER — Telehealth: Payer: Self-pay

## 2020-01-27 DIAGNOSIS — R197 Diarrhea, unspecified: Secondary | ICD-10-CM | POA: Diagnosis not present

## 2020-01-27 LAB — CELIAC DISEASE AB SCREEN W/RFX
Antigliadin Abs, IgA: 3 units (ref 0–19)
IgA/Immunoglobulin A, Serum: 226 mg/dL (ref 87–352)
Transglutaminase IgA: <2 U/mL (ref 0–3)

## 2020-01-27 NOTE — Telephone Encounter (Signed)
Confirmed pt appt and screen for 01/31/20. Laura [name]

## 2020-01-30 ENCOUNTER — Telehealth: Payer: Self-pay | Admitting: Gastroenterology

## 2020-01-30 NOTE — Telephone Encounter (Signed)
Pt left vm her pharmacy needs [place] to contact her so she can get her medicine filled rx  cholestramine  Commonly known as [place]

## 2020-01-30 NOTE — Telephone Encounter (Signed)
Informed patient of the information that was happening. Patient states she is still having the diarrhea and is having to take the Imodium often. Informed patient as soon as I hear back from her cardiologist I would give her a call.

## 2020-01-30 NOTE — Telephone Encounter (Signed)
Informed pharmacy of this information. Called and informed patient and she stales she will not take the furosemide

## 2020-01-30 NOTE — Telephone Encounter (Signed)
She can hold furosemide until she finishes treatment for diarrhea.

## 2020-01-30 NOTE — Telephone Encounter (Signed)
Informed patient that the pharmacy is working on getting the medication ready for her

## 2020-01-30 NOTE — Telephone Encounter (Signed)
Patient called & l/m on v/m stating she had called the pharmacy and they still don't have the medication prescribed by DR Vicente Males. She is aware we where calling  her other doctor to make sure it was ok to take the medication to stop the diarrhea. Please call patient before 4:30 today per her request.

## 2020-01-30 NOTE — Telephone Encounter (Signed)
We can take the Questran at night.

## 2020-01-30 NOTE — Telephone Encounter (Signed)
This was filled on 01/25/2020 with 5 refills. Called pharmacy to find out what was going on with the prescription. They state that the Questran and Furosemide interact and if they take them both together the Furosemide will not work while taking the [place].

## 2020-01-30 NOTE — Telephone Encounter (Signed)
Please advised if patient can stop the furosemide while patient is taking the Questran for the diarrhea

## 2020-01-30 NOTE — Telephone Encounter (Signed)
Pharmacist states that that even separating the does it will still decreased the effect by 90 percent. Dr. Vicente Males wants [place] to check with the Cardiologist to see what he thinks we should do because the patient is having diarrhea.

## 2020-01-31 ENCOUNTER — Encounter: Payer: Self-pay | Admitting: Nurse Practitioner

## 2020-01-31 ENCOUNTER — Ambulatory Visit (INDEPENDENT_AMBULATORY_CARE_PROVIDER_SITE_OTHER): Payer: BC Managed Care – PPO | Admitting: Nurse Practitioner

## 2020-01-31 ENCOUNTER — Other Ambulatory Visit: Payer: Self-pay

## 2020-01-31 VITALS — BP 148/75 | HR 95 | Temp 97.9°F | Resp 16 | Ht 64.0 in | Wt 269.0 lb

## 2020-01-31 DIAGNOSIS — J4550 Severe persistent asthma, uncomplicated: Secondary | ICD-10-CM

## 2020-01-31 DIAGNOSIS — I4891 Unspecified atrial fibrillation: Secondary | ICD-10-CM

## 2020-01-31 DIAGNOSIS — E538 Deficiency of other specified B group vitamins: Secondary | ICD-10-CM | POA: Diagnosis not present

## 2020-01-31 DIAGNOSIS — F411 Generalized anxiety disorder: Secondary | ICD-10-CM

## 2020-01-31 DIAGNOSIS — R0602 Shortness of breath: Secondary | ICD-10-CM

## 2020-01-31 LAB — GI PROFILE, STOOL, PCR
Adenovirus F 40/41: NOT DETECTED
Astrovirus: NOT DETECTED
C difficile toxin A/B: NOT DETECTED
Campylobacter: NOT DETECTED
Cryptosporidium: NOT DETECTED
Cyclospora cayetanensis: NOT DETECTED
Entamoeba histolytica: NOT DETECTED
Enteroaggregative E coli: DETECTED — AB
Enteropathogenic E coli: NOT DETECTED
Enterotoxigenic E coli: NOT DETECTED
Giardia lamblia: NOT DETECTED
Norovirus GI/GII: NOT DETECTED
Plesiomonas shigelloides: NOT DETECTED
Rotavirus A: NOT DETECTED
Salmonella: NOT DETECTED
Sapovirus: NOT DETECTED
Shiga-toxin-producing E coli: NOT DETECTED
Shigella/Enteroinvasive E coli: NOT DETECTED
Vibrio cholerae: NOT DETECTED
Vibrio: NOT DETECTED
Yersinia enterocolitica: NOT DETECTED

## 2020-01-31 MED ORDER — CYANOCOBALAMIN 1000 MCG/ML IJ SOLN
1000.0000 ug | Freq: Once | INTRAMUSCULAR | Status: AC
  Administered 2020-01-31: 09:00:00 1000 ug via INTRAMUSCULAR

## 2020-01-31 MED ORDER — ALPRAZOLAM 0.25 MG PO TABS: 0.2500 mg | ORAL_TABLET | Freq: Two times a day (BID) | ORAL | 3 refills | Status: DC

## 2020-01-31 NOTE — Progress Notes (Signed)
[facility] [place], [place] [address]  Internal MEDICINE  Office Visit Note  Patient Name: Laura [name]  [id number]  [id number]  Date of Service: 01/31/2020  Chief Complaint  Patient presents with  . Hypertension  . Gastroesophageal Reflux    The patient is here for follow up visit. She states that she had been having shortness of breath and muscle pain in her legs over the weekend. Had to use her oxygen at all times over the weekend to help her breathing and leg pain. She had recently been started on digoxin per her cardiology. After starting this, she developed abdominal pain, nausea, cramping, and diarrhea. She states that this is gradually improving, which is expected after starting on digoxin. She states that her GI doctor started her on Questran to help with frequent diarrhea and cramping. She started that this morning. Today, the patient started having mild cough. She states that she is otherwise doing well.       Current Medication: Outpatient Encounter Medications as of 01/31/2020  Medication Sig  . albuterol (VENTOLIN HFA) 108 (90 Base) MCG/ACT inhaler Inhale 2 puffs into the lungs every 4 (four) hours as needed for wheezing or shortness of breath.  . ALPRAZolam (XANAX) 0.25 MG tablet Take 1 tablet (0.25 mg total) by mouth 2 (two) times daily.  Marland Kitchen apixaban (ELIQUIS) 5 MG TABS tablet Take 1 tablet (5 mg total) by mouth 2 (two) times daily.  . cholestyramine (QUESTRAN) 4 g packet Take 1 packet (4 g total) by mouth 2 (two) times daily.  Marland Kitchen dexlansoprazole (DEXILANT) 60 MG capsule Take 1 capsule (60 mg total) by mouth daily.  . digoxin (LANOXIN) 0.25 MG tablet Take 0.5 tablets (0.125 mg total) by mouth daily.  Marland Kitchen diltiazem (CARDIZEM CD) 360 MG 24 hr capsule Take 1 capsule (360 mg total) by mouth daily.  Marland Kitchen EPINEPHrine 0.3 mg/0.3 mL IJ SOAJ injection Inject 0.3 mg into the muscle as needed for anaphylaxis.   Marland Kitchen ipratropium-albuterol (DUONEB) 0.5-2.5 (3)  MG/3ML SOLN Take 3 mLs by nebulization every 4 (four) hours as needed. (Patient taking differently: Take 3 mLs by nebulization every 4 (four) hours as needed (shortness of breath / wheezing). )  . levalbuterol (XOPENEX) 0.31 MG/3ML nebulizer solution Take 3 mLs by nebulization every 4 (four) hours as needed. For wheezing/ and or shortness of breath.  . losartan (COZAAR) 25 MG tablet Take 1 tablet (25 mg total) by mouth daily.  . metaxalone (SKELAXIN) 800 MG tablet metaxalone 800 mg tablet  Take 1 tablet every 8 hours by oral route.  . montelukast (SINGULAIR) 10 MG tablet TAKE 1 TABLET BY MOUTH EVERY DAY (Patient taking differently: Take 10 mg by mouth at bedtime. )  . Multiple Vitamin (MULTIVITAMIN) tablet Take 1 tablet by mouth daily.  . ondansetron (ZOFRAN) 4 MG tablet Take 1 tablet (4 mg total) by mouth every 8 (eight) hours as needed for nausea or vomiting.  . predniSONE (DELTASONE) 10 MG tablet TAKE 1 TABLET (10 MG TOTAL) BY MOUTH DAILY WITH BREAKFAST.  Marland Kitchen Probiotic Product (PROBIOTIC-10) CAPS Take 1 capsule by mouth daily.   Marland Kitchen spironolactone (ALDACTONE) 25 MG tablet Take 1 tablet (25 mg total) by mouth 2 (two) times daily.  . theophylline (THEODUR) 300 MG 12 hr tablet Take 1 tablet (300 mg total) by mouth 2 (two) times daily.  . [DISCONTINUED] ALPRAZolam (XANAX) 0.25 MG tablet Take 1 tablet (0.25 mg total) by mouth 2 (two) times daily.  . [DISCONTINUED] furosemide (  LASIX) 40 MG tablet Take 1 tablet (40 mg total) by mouth daily. (Patient not taking: Reported on 01/31/2020)   Facility-Administered Encounter Medications as of 01/31/2020  Medication  . cyanocobalamin ((VITAMIN B-12)) injection 1,000 mcg  . [COMPLETED] cyanocobalamin ((VITAMIN B-12)) injection 1,000 mcg    Surgical History: Past Surgical History:  Procedure Laterality Date  . BOWEL RESECTION  09/11/2019   Procedure: SMALL BOWEL RESECTION;  Surgeon: Herbert Pun, MD;  Location: [facility] OR[facility];  Service: General;;  . cataract  surgery    . INCISION AND DRAINAGE ABSCESS Right 06/29/2016   Procedure: INCISION AND DRAINAGE ABSCESS;  Surgeon: Florene Glen, MD;  Location: [facility];  Service: General;  Laterality: Right;  . INCISION AND DRAINAGE OF WOUND Left 06/29/2016   Procedure: IRRIGATION AND DEBRIDEMENT WOUND;  Surgeon: Florene Glen, MD;  Location: [facility];  Service: General;  Laterality: Left;  . LAPAROSCOPIC RIGHT COLECTOMY  09/11/2019   Procedure: RIGHT COLECTOMY;  Surgeon: Herbert Pun, MD;  Location: [facility];  Service: General;;  . LAPAROSCOPY N/A 09/11/2019   Procedure: LAPAROSCOPY DIAGNOSTIC;  Surgeon: Herbert Pun, MD;  Location: [facility];  Service: General;  Laterality: N/A;  . LAPAROTOMY N/A 09/13/2019   Procedure: REOPENING OF RECENT LAPAROTOMYANASTOMOSIS OF BOWEL;  Surgeon: Herbert Pun, MD;  Location: [facility];  Service: General;  Laterality: N/A;  . VISCERAL ANGIOGRAPHY N/A 09/12/2019   Procedure: VISCERAL ANGIOGRAPHY;  Surgeon: Algernon Huxley, MD;  Location: [place] [facility];  Service: Cardiovascular;  Laterality: N/A;    Medical History: Past Medical History:  Diagnosis Date  . Asthma   . Chronic atrial fibrillation ([place])    a. diagnosed in 09/2016; b. failed flecainide and propafenone due to LE swelling and SOB, could not afford Multaq; c. CHADS2VASc => 3 (HTN, age x 1, female); d. on Eliquis  . GERD (gastroesophageal reflux disease)   . History of stress test    a. Lexiscan Myoview 10/2016: no evidence of ischemia, EF 53%  . Hypertension   . Obesity   . Obstructive sleep apnea   . Pulmonary hypertension ([place])   . Systolic dysfunction    a. TTE 10/2016: EF 50%, mild LVH, moderately dilated LA, moderate MR/TR, mild pulmonary hypertension    Family History: Family History  Problem Relation Age of Onset  . Dementia Mother   . Osteoporosis Mother   . Vascular Disease Mother   . COPD Father     Social History   Socioeconomic History  . Marital status:  Married    Spouse name: Not on file  . Number of children: Not on file  . Years of education: Not on file  . Highest education level: Not on file  Occupational History  . Not on file  Tobacco Use  . Smoking status: Never Smoker  . Smokeless tobacco: Never Used  Substance and Sexual Activity  . Alcohol use: No  . Drug use: No  . Sexual activity: Not on file  Other Topics Concern  . Not on file  Social History Narrative  . Not on file   Social Determinants of Health   Financial Resource Strain:   . Difficulty of Paying Living Expenses: Not on file  Food Insecurity:   . Worried About Charity fundraiser in the Last Year: Not on file  . Ran Out of Food in the Last Year: Not on file  Transportation Needs:   . Lack of Transportation (Medical): Not on file  . Lack of Transportation (Non-Medical): Not  on file  Physical Activity:   . Days of Exercise per Week: Not on file  . Minutes of Exercise per Session: Not on file  Stress:   . Feeling of Stress : Not on file  Social Connections:   . Frequency of Communication with Friends and Family: Not on file  . Frequency of Social Gatherings with Friends and Family: Not on file  . Attends Religious Services: Not on file  . Active Member of Clubs or Organizations: Not on file  . Attends Archivist Meetings: Not on file  . Marital Status: Not on file  Intimate Partner Violence:   . Fear of Current or Ex-Partner: Not on file  . Emotionally Abused: Not on file  . Physically Abused: Not on file  . Sexually Abused: Not on file      Review of Systems  Constitutional: Positive for activity change and fatigue. Negative for chills and unexpected weight change.  HENT: Negative for congestion, postnasal drip, rhinorrhea, sneezing and sore throat.   Respiratory: Positive for cough, shortness of breath and wheezing. Negative for chest tightness.   Cardiovascular: Negative for chest pain and palpitations.  Gastrointestinal: Negative  for abdominal pain, constipation, diarrhea, nausea and vomiting.  Endocrine: Negative for cold intolerance, heat intolerance, polydipsia and polyuria.  Musculoskeletal: Positive for arthralgias and myalgias. Negative for back pain, joint swelling and neck pain.  Skin: Negative for rash.  Allergic/Immunologic: Positive for environmental allergies.  Neurological: Negative for dizziness, tremors, numbness and headaches.  Hematological: Negative for adenopathy. Does not bruise/bleed easily.  Psychiatric/Behavioral: Negative for behavioral problems (Depression), sleep disturbance and suicidal ideas. The patient is nervous/anxious.     Today's Vitals   01/31/20 0841  BP: (!) 148/75  Pulse: 95  Resp: 16  Temp: 97.9 F (36.6 C)  SpO2: 96%  Weight: 269 lb (122 kg)   Body mass index is 45.46 kg/m.  Physical Exam Vitals and nursing note reviewed.  Constitutional:      General: She is not in acute distress.    Appearance: Normal appearance. She is well-developed. She is not diaphoretic.  HENT:     Head: Normocephalic and atraumatic.     Mouth/Throat:     Pharynx: No oropharyngeal exudate.  Eyes:     Pupils: Pupils are equal, round, and reactive to light.  Neck:     Thyroid: No thyromegaly.     Vascular: No JVD.     Trachea: No tracheal deviation.  Cardiovascular:     Rate and Rhythm: Normal rate. Rhythm irregular.     Heart sounds: Normal heart sounds. No murmur. No friction rub. No gallop.   Pulmonary:     Effort: Pulmonary effort is normal. No respiratory distress.     Breath sounds: Wheezing present. No rales.  Chest:     Chest wall: No tenderness.  Abdominal:     General: Bowel sounds are normal. There is distension.     Palpations: Abdomen is soft.     Tenderness: There is abdominal tenderness.  Musculoskeletal:        General: Normal range of motion.     Cervical back: Normal range of motion and neck supple.  Lymphadenopathy:     Cervical: No cervical adenopathy.   Skin:    General: Skin is warm and dry.  Neurological:     Mental Status: She is alert and oriented to person, place, and time.     Cranial Nerves: No cranial nerve deficit.  Psychiatric:  Attention and Perception: Attention and perception normal.        Mood and Affect: Affect normal. Mood is anxious.        Speech: Speech normal.        Behavior: Behavior normal. Behavior is cooperative.        Thought Content: Thought content normal.        Cognition and Memory: Cognition normal.        Judgment: Judgment normal.    Assessment/Plan: 1. Severe persistent asthma without complication Overall, managed well. Continue inhalers and respiratory medication as prescribed   2. SOB (shortness of breath) Use oxygen and rescue inhalers as needed and as prescribed. May need to have new prescription for portable oxygen.   3. Atrial fibrillation with rapid ventricular response [facility]) Patient is folowed routinely by cardiology.   4. B12 deficiency b12 injection administered today.  - cyanocobalamin ((VITAMIN B-12)) injection 1,000 mcg  5. Generalized anxiety disorder May take alprazolam 0.25mg  twice daily as needed for acute anxiety. New prescription administered today.  - ALPRAZolam (XANAX) 0.25 MG tablet; Take 1 tablet (0.25 mg total) by mouth 2 (two) times daily.  Dispense: 45 tablet; Refill: 3  General Counseling: Gretchan verbalizes understanding of the findings of todays visit and agrees with plan of treatment. I have discussed any further diagnostic evaluation that may be needed or ordered today. We also reviewed her medications today. she has been encouraged to call the office with any questions or concerns that should arise related to todays visit.  Hypertension Counseling:   The following hypertensive lifestyle modification were recommended and discussed:  1. Limiting alcohol intake to less than 1 oz/day of ethanol:(24 oz of beer or 8 oz of wine or 2 oz of 100-proof  whiskey). 2. Take baby ASA 81 mg daily. 3. Importance of regular aerobic exercise and losing weight. 4. Reduce dietary saturated fat and cholesterol intake for overall cardiovascular health. 5. Maintaining adequate dietary potassium, calcium, and magnesium intake. 6. Regular monitoring of the blood pressure. 7. Reduce sodium intake to less than 100 mmol/day (less than 2.3 gm of sodium or less than 6 gm of sodium choride)   This patient was seen by [place] with Dr [name] as a part of collaborative care agreement   Meds ordered this encounter  Medications  . cyanocobalamin ((VITAMIN B-12)) injection 1,000 mcg  . ALPRAZolam (XANAX) 0.25 MG tablet    Sig: Take 1 tablet (0.25 mg total) by mouth 2 (two) times daily.    Dispense:  45 tablet    Refill:  3    Order Specific Question:   Supervising Provider    Answer:   [name] [id number]    Total time spent: 30 Minutes   Time spent includes review of chart, medications, test results, and follow up plan with the patient.      Dr [name] Internal medicine

## 2020-02-01 LAB — C DIFFICILE, CYTOTOXIN B

## 2020-02-01 LAB — SPECIMEN STATUS REPORT

## 2020-02-01 LAB — C DIFFICILE TOXINS A+B W/RFLX: C difficile Toxins A+B, EIA: NEGATIVE

## 2020-02-01 NOTE — Progress Notes (Signed)
Inform has Ecoli in the stool: if still having diarrhea will need antibiotics

## 2020-02-02 ENCOUNTER — Encounter: Payer: Self-pay | Admitting: Internal Medicine

## 2020-02-02 ENCOUNTER — Other Ambulatory Visit: Payer: Self-pay

## 2020-02-02 ENCOUNTER — Ambulatory Visit (INDEPENDENT_AMBULATORY_CARE_PROVIDER_SITE_OTHER): Payer: BC Managed Care – PPO | Admitting: Internal Medicine

## 2020-02-02 ENCOUNTER — Telehealth: Payer: Self-pay

## 2020-02-02 ENCOUNTER — Telehealth: Payer: Self-pay | Admitting: Gastroenterology

## 2020-02-02 ENCOUNTER — Telehealth: Payer: Self-pay | Admitting: Cardiovascular Disease

## 2020-02-02 VITALS — BP 150/74 | HR 78 | Temp 97.9°F | Resp 16 | Ht 64.0 in | Wt 269.0 lb

## 2020-02-02 DIAGNOSIS — G4733 Obstructive sleep apnea (adult) (pediatric): Secondary | ICD-10-CM | POA: Diagnosis not present

## 2020-02-02 DIAGNOSIS — Z8616 Personal history of COVID-19: Secondary | ICD-10-CM | POA: Diagnosis not present

## 2020-02-02 DIAGNOSIS — I4891 Unspecified atrial fibrillation: Secondary | ICD-10-CM | POA: Diagnosis not present

## 2020-02-02 DIAGNOSIS — R0602 Shortness of breath: Secondary | ICD-10-CM

## 2020-02-02 DIAGNOSIS — Z9989 Dependence on other enabling machines and devices: Secondary | ICD-10-CM

## 2020-02-02 MED ORDER — BUDESONIDE 0.25 MG/2ML IN SUSP: 0.2500 mg | Freq: Two times a day (BID) | RESPIRATORY_TRACT | 12 refills | Status: DC

## 2020-02-02 NOTE — Progress Notes (Signed)
No , can we get in touch with her cardiologist and find out if we can hold digoxin for a week while we treat her?

## 2020-02-02 NOTE — Telephone Encounter (Signed)
Nurse from [employer] on behalf of Lenard Forth regarding the patient needing to start azithromycin. Provider wanting to know if patient can hold digoxin for 1 week  Please advise provider Lenard Forth, by calling [phone] or sending an Epic message

## 2020-02-02 NOTE — Telephone Encounter (Signed)
Patient called & states she spoke with someone today about her results & she was in the pulmonologist office & has more questions. Please call.

## 2020-02-02 NOTE — Telephone Encounter (Signed)
Ok to hold Digoxin for 1 week.

## 2020-02-02 NOTE — Telephone Encounter (Signed)
Message fwd to Dr. Arida to advise. 

## 2020-02-02 NOTE — Telephone Encounter (Signed)
-----   Message from [name], MD sent at 02/02/2020 12:30 PM EST ----- Azithromycin Oral: 500 mg once daily for 3 days  Check for allergies  Kiran ----- Message ----- From: Rushie Chestnut, CMA Sent: 02/02/2020   9:55 AM EST To: [name], MD  Dr. Vicente Males, pt states she's still experiencing diarrhea. Which antibiotic would you like to prescribe? ----- Message ----- From: [name], MD Sent: 02/01/2020   7:36 AM EST To: Rushie Chestnut, CMA  Inform has Ecoli in the stool: if still having diarrhea will need antibiotics

## 2020-02-02 NOTE — Telephone Encounter (Signed)
Spoke with pt and answered her questions regarding her stool test result. I also informed pt that there's a very high drug interaction with the Azithromycin and her Digoxin, therefore Dr. Vicente Males recommends holding the Digoxin for a week while pt completes the antibiotic. I have contacted pt's cardiology office to enquire if pt is okay to hold the Digoxin. We are waiting for cardiologist recommendations.

## 2020-02-02 NOTE — Progress Notes (Signed)
[facility] [place], [place] [address]  Pulmonary Sleep Medicine   Office Visit Note  Patient Name: Laura [name] DOB: August 15, 1951 MRN [id number]  Date of Service: 02/02/2020  Complaints/HPI: Patient is here today for routine pulmonary follow-up. She is having worsening shortness of breath with walking short distances and even with walking. She reports that she feels she constantly has an elephant sitting on her chest. 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  September 2020 she had emergency abdominal surgery which resulted in 9 days of hospitalization, ICU admission as well as mechanical ventilation. This course was complicated by positive 0000000 virus in October after discharge from hospital. Patient reports she had a mild case of COVID19, only symptoms was nasal drainage.  Since her surgery and COVID19 course she is having significant shortness of breath with short distance ambulation as well as when talking. She reports it takes her several minutes to recover from an episode of shortness of breath. She struggles walking short distances in her home and to and from her car to work. She is able to use a scooter at work which helps with her symptoms as she is not having to ambulate, planning to retire in April. Today in office she was unable to complete 6 minute walk test due to severe shortness of breath as well as intense chest pressure. Since her bowel surgery her medications have been altered due to absorption issues, currently taking 300 mg Theophylline BID, no daily inhalers, reports has been using her Xopenex nebulizer everyday. Furosemide was stopped due to interference with Questran, she denies any swelling in her ankles since stopping medication.  ROS  General: (-) fever, (-) chills, (-) night sweats, (-) weakness Skin: (-) rashes, (-) itching,. Eyes:  (-) visual changes, (-) redness, (-) itching. Nose and Sinuses: (-) nasal stuffiness or itchiness, (-) postnasal drip, (-) nosebleeds, (-) sinus trouble. Mouth and Throat: (-) sore throat, (-) hoarseness. Neck: (-) swollen glands, (-) enlarged thyroid, (-) neck pain. Respiratory: - cough, (-) bloody sputum, + shortness of breath, + wheezing. Cardiovascular: - ankle swelling, (-) chest pain. Lymphatic: (-) lymph node enlargement. Neurologic: (-) numbness, (-) tingling. Psychiatric: (-) anxiety, (-) depression   Current Medication: Outpatient Encounter Medications as of 02/02/2020  Medication Sig  . albuterol (VENTOLIN HFA) 108 (90 Base) MCG/ACT inhaler Inhale 2 puffs into the lungs every 4 (four) hours as needed for wheezing or shortness of breath.  . ALPRAZolam (XANAX) 0.25 MG tablet Take 1 tablet (0.25 mg total) by mouth 2 (two) times daily.  Marland Kitchen apixaban (ELIQUIS) 5 MG TABS tablet Take 1 tablet (5 mg total) by mouth 2 (two) times daily.  . cholestyramine (QUESTRAN) 4 g packet Take 1 packet (4 g total) by mouth 2 (two) times daily.  Marland Kitchen dexlansoprazole (DEXILANT) 60 MG capsule Take 1 capsule (60 mg total) by mouth daily.  . digoxin (LANOXIN) 0.25 MG tablet Take 0.5 tablets (0.125 mg total) by mouth daily.  Marland Kitchen diltiazem (CARDIZEM CD) 360 MG 24 hr capsule Take 1 capsule (360 mg total) by mouth daily.  Marland Kitchen EPINEPHrine 0.3 mg/0.3 mL IJ SOAJ injection Inject 0.3 mg into the muscle as needed for anaphylaxis.   Marland Kitchen ipratropium-albuterol (DUONEB) 0.5-2.5 (3) MG/3ML SOLN Take 3 mLs by nebulization every 4 (four) hours as needed. (Patient taking differently: Take 3 mLs by nebulization every 4 (four) hours as  needed (shortness of breath / wheezing). )  . levalbuterol (XOPENEX) 0.31 MG/3ML nebulizer solution Take 3 mLs by nebulization every 4 (four) hours as needed. For wheezing/ and or shortness of breath.  . losartan (COZAAR) 25 MG tablet Take 1 tablet (25 mg total) by mouth daily.  . metaxalone (SKELAXIN) 800  MG tablet metaxalone 800 mg tablet  Take 1 tablet every 8 hours by oral route.  . montelukast (SINGULAIR) 10 MG tablet TAKE 1 TABLET BY MOUTH EVERY DAY (Patient taking differently: Take 10 mg by mouth at bedtime. )  . Multiple Vitamin (MULTIVITAMIN) tablet Take 1 tablet by mouth daily.  . ondansetron (ZOFRAN) 4 MG tablet Take 1 tablet (4 mg total) by mouth every 8 (eight) hours as needed for nausea or vomiting.  . predniSONE (DELTASONE) 10 MG tablet TAKE 1 TABLET (10 MG TOTAL) BY MOUTH DAILY WITH BREAKFAST.  Marland Kitchen Probiotic Product (PROBIOTIC-10) CAPS Take 1 capsule by mouth daily.   Marland Kitchen spironolactone (ALDACTONE) 25 MG tablet Take 1 tablet (25 mg total) by mouth 2 (two) times daily.  . theophylline (THEODUR) 300 MG 12 hr tablet Take 1 tablet (300 mg total) by mouth 2 (two) times daily.   Facility-Administered Encounter Medications as of 02/02/2020  Medication  . cyanocobalamin ((VITAMIN B-12)) injection 1,000 mcg    Surgical History: Past Surgical History:  Procedure Laterality Date  . BOWEL RESECTION  09/11/2019   Procedure: SMALL BOWEL RESECTION;  Surgeon: Herbert Pun, MD;  Location: [facility];  Service: General;;  . cataract surgery    . INCISION AND DRAINAGE ABSCESS Right 06/29/2016   Procedure: INCISION AND DRAINAGE ABSCESS;  Surgeon: Florene Glen, MD;  Location: [facility];  Service: General;  Laterality: Right;  . INCISION AND DRAINAGE OF WOUND Left 06/29/2016   Procedure: IRRIGATION AND DEBRIDEMENT WOUND;  Surgeon: Florene Glen, MD;  Location: [facility];  Service: General;  Laterality: Left;  . LAPAROSCOPIC RIGHT COLECTOMY  09/11/2019   Procedure: RIGHT COLECTOMY;  Surgeon: Herbert Pun, MD;  Location: [facility];  Service: General;;  . LAPAROSCOPY N/A 09/11/2019   Procedure: LAPAROSCOPY DIAGNOSTIC;  Surgeon: Herbert Pun, MD;  Location: [facility];  Service: General;  Laterality: N/A;  . LAPAROTOMY N/A 09/13/2019   Procedure: REOPENING OF RECENT  LAPAROTOMYANASTOMOSIS OF BOWEL;  Surgeon: Herbert Pun, MD;  Location: [facility];  Service: General;  Laterality: N/A;  . VISCERAL ANGIOGRAPHY N/A 09/12/2019   Procedure: VISCERAL ANGIOGRAPHY;  Surgeon: Algernon Huxley, MD;  Location: [facility];  Service: Cardiovascular;  Laterality: N/A;    Medical History: Past Medical History:  Diagnosis Date  . Asthma   . Chronic atrial fibrillation ([place])    a. diagnosed in 09/2016; b. failed flecainide and propafenone due to LE swelling and SOB, could not afford Multaq; c. CHADS2VASc => 3 (HTN, age x 1, female); d. on Eliquis  . GERD (gastroesophageal reflux disease)   . History of stress test    a. Lexiscan Myoview 10/2016: no evidence of ischemia, EF 53%  . Hypertension   . Obesity   . Obstructive sleep apnea   . Pulmonary hypertension ([place])   . Systolic dysfunction    a. TTE 10/2016: EF 50%, mild LVH, moderately dilated LA, moderate MR/TR, mild pulmonary hypertension    Family History: Family History  Problem Relation Age of Onset  . Dementia Mother   . Osteoporosis Mother   . Vascular Disease Mother   . COPD Father     Social History: Social History  Socioeconomic History  . Marital status: Married    Spouse name: Not on file  . Number of children: Not on file  . Years of education: Not on file  . Highest education level: Not on file  Occupational History  . Not on file  Tobacco Use  . Smoking status: Never Smoker  . Smokeless tobacco: Never Used  Substance and Sexual Activity  . Alcohol use: No  . Drug use: No  . Sexual activity: Not on file  Other Topics Concern  . Not on file  Social History Narrative  . Not on file   Social Determinants of Health   Financial Resource Strain:   . Difficulty of Paying Living Expenses: Not on file  Food Insecurity:   . Worried About Charity fundraiser in the Last Year: Not on file  . Ran Out of Food in the Last Year: Not on file  Transportation Needs:   . Lack  of Transportation (Medical): Not on file  . Lack of Transportation (Non-Medical): Not on file  Physical Activity:   . Days of Exercise per Week: Not on file  . Minutes of Exercise per Session: Not on file  Stress:   . Feeling of Stress : Not on file  Social Connections:   . Frequency of Communication with Friends and Family: Not on file  . Frequency of Social Gatherings with Friends and Family: Not on file  . Attends Religious Services: Not on file  . Active Member of Clubs or Organizations: Not on file  . Attends Archivist Meetings: Not on file  . Marital Status: Not on file  Intimate Partner Violence:   . Fear of Current or Ex-Partner: Not on file  . Emotionally Abused: Not on file  . Physically Abused: Not on file  . Sexually Abused: Not on file    Vital Signs: Blood pressure (!) 150/74, pulse 78, temperature 97.9 F (36.6 C), resp. rate 16, height 5\' 4"  (1.626 m), weight 269 lb (122 kg), SpO2 99 %.  Examination: General Appearance: The patient is well-developed, well-nourished, and in no distress. Skin: Gross inspection of skin unremarkable. Head: normocephalic, no gross deformities. Eyes: no gross deformities noted. ENT: ears appear grossly normal no exudates. Neck: Supple. No thyromegaly. No LAD. Respiratory: Diminished expiratory wheezing bilateral. Cardiovascular: Normal S1 and S2 without murmur or rub. Extremities: No cyanosis. pulses are equal. Neurologic: Alert and oriented. No involuntary movements.  LABS: Recent Results (from the past 2160 hour(s))  CBC     Status: Abnormal   Collection Time: 01/02/20  2:52 PM  Result Value Ref Range   WBC 7.8 3.4 - 10.8 x10E3/uL   RBC 3.93 3.77 - 5.28 x10E6/uL   Hemoglobin 10.7 (L) 11.1 - 15.9 g/dL   Hematocrit 33.5 (L) 34.0 - 46.6 %   MCV 85 79 - 97 fL   MCH 27.2 26.6 - 33.0 pg   MCHC 31.9 31.5 - 35.7 g/dL   RDW 14.6 11.7 - 15.4 %   Platelets 346 150 - 450 [id number]  Basic metabolic panel     Status:  Abnormal   Collection Time: 01/02/20  2:52 PM  Result Value Ref Range   Glucose 113 (H) 65 - 99 mg/dL   BUN 13 8 - 27 mg/dL   Creatinine, Ser 0.88 0.57 - 1.00 mg/dL   GFR calc non Af Amer 67 >59 mL/min/1.73   GFR calc Af Amer 78 >59 mL/min/1.73   BUN/Creatinine Ratio 15 12 - 28  Sodium 143 134 - 144 mmol/L   Potassium 4.1 3.5 - 5.2 mmol/L   Chloride 106 96 - 106 mmol/L   CO2 22 20 - 29 mmol/L   Calcium 9.3 8.7 - 10.3 mg/dL  Iron and TIBC     Status: Abnormal   Collection Time: 01/02/20  2:52 PM  Result Value Ref Range   Total Iron Binding Capacity 412 250 - 450 ug/dL   UIBC 185 118 - 369 ug/dL   Iron 227 (H) 27 - 139 ug/dL   Iron Saturation 55 15 - 55 %  B12 and Folate Panel     Status: Abnormal   Collection Time: 01/02/20  2:52 PM  Result Value Ref Range   Vitamin B-12 >2000 (H) 232 - 1245 pg/mL   Folate >20.0 >3.0 ng/mL    Comment: A serum folate concentration of less than 3.1 ng/mL is considered to represent clinical deficiency.   Hgb A1c w/o eAG     Status: Abnormal   Collection Time: 01/02/20  2:52 PM  Result Value Ref Range   Hgb A1c MFr Bld 5.9 (H) 4.8 - 5.6 %    Comment:          Prediabetes: 5.7 - 6.4          Diabetes: >6.4          Glycemic control for adults with diabetes: <7.0   T4, free     Status: None   Collection Time: 01/02/20  2:52 PM  Result Value Ref Range   Free T4 1.48 0.82 - 1.77 ng/dL  TSH     Status: None   Collection Time: 01/02/20  2:52 PM  Result Value Ref Range   TSH 1.810 0.450 - 4.500 uIU/mL  Theophylline level     Status: Abnormal   Collection Time: 01/02/20  2:52 PM  Result Value Ref Range   Theophylline, Serum 6.1 (L) 10.0 - 20.0 ug/mL    Comment:                                 Detection Limit =  0.8                           <0.8 indicates None Detected   VITAMIN D 25 Hydroxy (Vit-D Deficiency, Fractures)     Status: Abnormal   Collection Time: 01/02/20  2:52 PM  Result Value Ref Range   Vit D, 25-Hydroxy 20.0 (L) 30.0 -  100.0 ng/mL    Comment: Vitamin D deficiency has been defined by the [place] and an Endocrine Society practice guideline as a level of serum 25-OH vitamin D less than 20 ng/mL (1,2). The Endocrine Society went on to further define vitamin D insufficiency as a level between 21 and 29 ng/mL (2). 1. IOM (Institute of Medicine). 2010. Dietary reference    intakes for calcium and D. [place]: The    [employer]. 2. Holick MF, Binkley Fallon, Bischoff-Ferrari HA, et al.    Evaluation, treatment, and prevention of vitamin D    deficiency: an Endocrine Society clinical practice    guideline. JCEM. 2011 Jul; 96(7):1911-30.   Phosphorus     Status: None   Collection Time: 01/02/20  2:52 PM  Result Value Ref Range   Phosphorus 4.1 3.0 - 4.3 mg/dL  RPR Qual     Status: None   Collection Time:  01/02/20  2:52 PM  Result Value Ref Range   RPR Ser Ql Non Reactive Non Reactive  Magnesium     Status: None   Collection Time: 01/02/20  2:52 PM  Result Value Ref Range   Magnesium 2.1 1.6 - 2.3 mg/dL  Ferritin     Status: None   Collection Time: 01/02/20  2:52 PM  Result Value Ref Range   Ferritin 25 15 - 150 ng/mL  Celiac Disease Ab Screen w/Rfx     Status: None   Collection Time: 01/25/20  4:27 PM  Result Value Ref Range   Antigliadin Abs, IgA 3 0 - 19 units    Comment:                    Negative                   0 - 19                    Weak Positive             20 - 30                    Moderate to Strong Positive   >30    Transglutaminase IgA <2 0 - 3 U/mL    Comment:                               Negative        0 -  3                               Weak Positive   4 - 10                               Positive           >10  Tissue Transglutaminase (tTG) has been identified  as the endomysial antigen.  Studies have demonstr-  ated that endomysial IgA antibodies have over 99%  specificity for gluten sensitive enteropathy.    IgA/Immunoglobulin A, Serum 226  87 - 352 mg/dL  C difficile Toxins A+B W/Rflx     Status: None   Collection Time: 01/27/20 12:00 AM   Specimen: Stool   STOOL  Result Value Ref Range   C difficile Toxins A+B, EIA Negative Negative  GI Profile, Stool, PCR     Status: Abnormal   Collection Time: 01/27/20 12:00 AM  Result Value Ref Range   Campylobacter Not Detected Not Detected   C difficile toxin A/B Not Detected Not Detected   Plesiomonas shigelloides Not Detected Not Detected   Salmonella Not Detected Not Detected   Vibrio Not Detected Not Detected   Vibrio cholerae Not Detected Not Detected   Yersinia enterocolitica Not Detected Not Detected   Enteroaggregative E coli Detected (A) Not Detected   Enteropathogenic E coli Not Detected Not Detected   Enterotoxigenic E coli Not Detected Not Detected   Shiga-toxin-producing E coli Not Detected Not Detected   E coli [id number] Not applicable Not Detected   Shigella/Enteroinvasive E coli Not Detected Not Detected   Cryptosporidium Not Detected Not Detected   Cyclospora cayetanensis Not Detected Not Detected   Entamoeba histolytica Not Detected Not Detected   Giardia lamblia Not Detected Not Detected  Adenovirus F 40/41 Not Detected Not Detected   Astrovirus Not Detected Not Detected   Norovirus GI/GII Not Detected Not Detected   Rotavirus A Not Detected Not Detected   Sapovirus Not Detected Not Detected  C difficile, Cytotoxin B     Status: None   Collection Time: 01/27/20 12:00 AM   STOOL  Result Value Ref Range   C diff Toxin B Final report     Comment:                             Reference Range: Negative   Result 1 Comment     Comment: Negative No cytotoxin detected.   Specimen status report     Status: None   Collection Time: 01/27/20 12:00 AM  Result Value Ref Range   specimen status report Comment     Comment: Please note Please note The date and/or time of collection was not indicated on the requisition as required by state and federal law.  The date  of receipt of the specimen was used as the collection date if not supplied.     Radiology: CT Head Wo Contrast  Result Date: 09/11/2019 CLINICAL DATA:  Head trauma, patient on anticoagulation EXAM: CT HEAD WITHOUT CONTRAST TECHNIQUE: Contiguous axial images were obtained from the base of the skull through the vertex without intravenous contrast. COMPARISON:  09/13/2013 FINDINGS: Brain: No evidence of acute infarction, hemorrhage, extra-axial collection, ventriculomegaly, or mass effect. Vascular: Cerebrovascular atherosclerotic calcifications are noted. Skull: Negative for fracture or focal lesion. Sinuses/Orbits: Visualized portions of the orbits are unremarkable. Visualized portions of the paranasal sinuses and mastoid air cells are unremarkable. Other: None. IMPRESSION: No acute intracranial pathology. Electronically Signed   By: Kathreen Devoid   On: 09/11/2019 12:51   CT ABDOMEN PELVIS W CONTRAST  Result Date: 09/11/2019 CLINICAL DATA:  Blunt abdominal trauma. Status post fall yesterday. Now with nausea, vomiting and body aches. EXAM: CT ABDOMEN AND PELVIS WITH CONTRAST TECHNIQUE: Multidetector CT imaging of the abdomen and pelvis was performed using the standard protocol following bolus administration of intravenous contrast. CONTRAST:  19mL OMNIPAQUE IOHEXOL 300 MG/ML  SOLN COMPARISON:  None. FINDINGS: Lower chest: No acute abnormality. Hepatobiliary: Small hypodense foci within the RIGHT liver lobe, too small to definitively characterize but most suggestive of benign cysts. No acute or suspicious findings within the liver. Gallbladder appears normal. No bile duct dilatation. Pancreas: Unremarkable. No pancreatic ductal dilatation or surrounding inflammatory changes. Spleen: Normal in size without focal abnormality. Adrenals/Urinary Tract: Adrenal glands appear normal. Kidneys appear normal without mass, stone or hydronephrosis. No ureteral or bladder calculi identified. Bladder is decompressed.  Stomach/Bowel: Extensive diverticulosis of the LEFT colon but no focal inflammatory change to suggest acute diverticulitis. Edematous thickening of the distal small bowel (ileum) with associated fluid stranding/inflammation in the surrounding mesentery. No pneumatosis intestinalis appreciated. More proximal small bowel is normal in caliber. No other site of bowel wall thickening/inflammation. Stomach is unremarkable, partially decompressed. Appendix is normal. Vascular/Lymphatic: Aortic atherosclerosis. No acute appearing vascular abnormality. No enlarged lymph nodes seen. Reproductive: Uterus and bilateral adnexa are unremarkable. Other: Small amount of free fluid in the lower abdomen and pelvis. No circumscribed collection or abscess like collection seen. No free intraperitoneal air. Musculoskeletal: No acute or suspicious osseous finding. Degenerative spondylosis of the thoracolumbar spine, mild to moderate in degree. IMPRESSION: 1. Pronounced edematous thickening of the walls of the distal small bowel (ileum) with associated fluid stranding/inflammation in  the surrounding mesentery. Findings are consistent with a focal small bowel enteritis of infectious, inflammatory or ischemic nature. Given patient's diffuse aortic atherosclerosis, I FAVOR ISCHEMIC BOWEL. Next most likely differential would be a focal severe infectious enteritis. Given patient's age, Crohn's disease is considered less likely. 2. Small amount of free fluid in the lower abdomen and pelvis. No circumscribed collection or abscess like collection seen. No free intraperitoneal air. 3. Extensive diverticulosis of the LEFT colon without evidence of acute diverticulitis. 4. Aortic atherosclerosis. Aortic Atherosclerosis (ICD10-I70.0). Electronically Signed   By: Franki Cabot M.D.   On: 09/11/2019 12:54   PERIPHERAL VASCULAR CATHETERIZATION  Result Date: 09/12/2019 See op note  DG Chest Port 1 View  Result Date: 09/11/2019 CLINICAL DATA:   Fall, abdominal pain, tachycardia EXAM: PORTABLE CHEST 1 VIEW COMPARISON:  CTA chest dated 06/29/2016 FINDINGS: Lungs are clear.  No pleural effusion or pneumothorax. The heart is top-normal in size.  Thoracic aortic atherosclerosis. IMPRESSION: No evidence of acute cardiopulmonary disease. Thoracic aortic atherosclerosis. Electronically Signed   By: Julian Hy M.D.   On: 09/11/2019 11:45   CT Angio Abd/Pel W and/or Wo Contrast  Result Date: 09/11/2019 CLINICAL DATA:  Possible ischemic bowel EXAM: CTA ABDOMEN AND PELVIS WITH CONTRAST TECHNIQUE: Multidetector CT imaging of the abdomen and pelvis was performed using the standard protocol during bolus administration of intravenous contrast. Multiplanar reconstructed images and MIPs were obtained and reviewed to evaluate the vascular anatomy. CONTRAST:  126mL OMNIPAQUE IOHEXOL 350 MG/ML SOLN COMPARISON:  Scan from earlier the same day FINDINGS: VASCULAR Aorta: Moderate calcified atheromatous plaque throughout. No aneurysm, dissection, or stenosis. Celiac: Calcified ostial plaque resulting in short segment stenosis of at least mild severity. Patent distally. SMA: Calcified ostial plaque resulting in short segment stenosis of at least mild severity. Widely patent distally. Classic distal branch anatomy. Renals: Single left, widely patent. Single right, with ostial plaque resulting in only mild stenosis, widely patent distally. IMA: Patent, diminutive. Inflow: Calcified nonocclusive plaque through bilateral common iliac arteries. Internal and external iliac arteries unremarkable. Proximal Outflow: Bilateral common femoral and visualized portions of the superficial and profunda femoral arteries are patent without evidence of aneurysm, dissection, vasculitis or significant stenosis. Veins: Patent portal vein, SMV, splenic vein, bilateral renal veins. No venous pathology is evident. Delayed venous phase was demonstrated on the previous study. Review of the MIP  images confirms the above findings. NON-VASCULAR Lower chest: Linear scarring or subsegmental atelectasis in the visualized lung bases. No pleural or pericardial effusion. Hepatobiliary: Stable low-attenuation right lobe lesions, statistically most likely cysts in the absence of a history of primary carcinoma, but incompletely characterized. Gallbladder physiologically distended. No biliary ductal dilatation. Pancreas: Unremarkable. No pancreatic ductal dilatation or surrounding inflammatory changes. Spleen: Normal in size without focal abnormality. Adrenals/Urinary Tract: Normal adrenals. Stomach/Bowel: Stomach is decompressed. Small bowel is nondilated. Persistent segment of circumferential wall thickening in the distal ileum over length of at least 30 cm extending nearly to the terminal ileum. Inflammatory/edematous changes in the adjacent mesentery with small regional pockets of interloop ascites without evidence of loculation or peripheral enhancement. Ileocecal valve unremarkable. Appendix not well seen. The colon is nondilated with scattered diverticula from distal descending and sigmoid segment. Lymphatic: No abdominal or pelvic adenopathy. Reproductive: Uterus and bilateral adnexa are unremarkable. Other: Small volume right lower quadrant pelvic ascites as above. No free air. Musculoskeletal: Multilevel spondylitic changes in the lumbar spine. No fracture or worrisome bone lesion. IMPRESSION: 1. 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 If there is continued clinical concern of ischemic bowel, arteriogram with pressure measurements across the stenoses may be useful to better define their hemodynamic significance, as they may be approachable for percutaneous intervention if indicated. 2. Persistent circumferential wall thickening in the distal ileum with regional inflammatory/edematous changes and mesenteric fluid; no abscess. Inflammatory  bowel disease, infectious/inflammatory enteritis, or ischemia could give this appearance. 3. Descending and sigmoid diverticulosis. Electronically Signed   By: Lucrezia Europe M.D.   On: 09/11/2019 15:58    No results found.  ECHOCARDIOGRAM COMPLETE  Result Date: 01/19/2020   ECHOCARDIOGRAM REPORT   Patient Name:   Laura [name] Date of Exam: 01/19/2020 Medical Rec #:  [id number]        Height:       64.5 in Accession #:    [id number]       Weight:       275.5 lb Date of Birth:  Jul 04, 1951         BSA:          2.25 m Patient Age:    69 years         BP:           130/80 mmHg Patient Gender: F                HR:           100 bpm. Exam Location:  [place] Procedure: 2D Echo, Cardiac Doppler and Color Doppler Indications:    I48.2 Chronic atrial fibrillation  History:        Patient has prior history of Echocardiogram examinations, most                 recent 07/29/2019. COPD and PAD, Arrythmias:Atrial Fibrillation,                 Signs/Symptoms:Shortness of Breath and Dyspnea; Risk                 Factors:Hypertension and Non-Smoker. Patient's recent bowel                 surgery 08/2019 is preventing her medication to absorb correctly.                 She has increased SOB with her COPD.  Sonographer:    Salvadore Dom RVT, RDCS (AE), RDMS Referring Phys: [place]  1. Left ventricular ejection fraction, by visual estimation, is 40 to 45%. The left ventricle has mild to moderately decreased function. There is no left ventricular hypertrophy.  2. Global right ventricle has mildly reduced systolic function.The right ventricular size is mildly enlarged. No increase in right ventricular wall thickness.  3. Left ventricular diastolic parameters are indeterminate.  4. The left ventricle demonstrates global hypokinesis.  5. Left atrial size was severely dilated.  6. The inferior vena cava is dilated in size with <50% respiratory variability, suggesting right atrial pressure of 15 mmHg.  7. Rhythm  is atrial fibrillation, rate 130 bpm. In comparison to the previous echocardiogram(s): EF 60%. FINDINGS  Left Ventricle: Left ventricular ejection fraction, by visual estimation, is 40 to 45%. The left ventricle has mild to moderately decreased function. The left ventricle demonstrates global hypokinesis. There is no left ventricular hypertrophy. Left ventricular diastolic parameters are indeterminate. Normal left atrial pressure. Right Ventricle: The right ventricular size is mildly enlarged. No increase in right ventricular wall thickness. Global RV systolic function is has  mildly reduced systolic function. Left Atrium: Left atrial size was severely dilated. Right Atrium: Right atrial size was normal in size Pericardium: There is no evidence of pericardial effusion. Mitral Valve: The mitral valve is normal in structure. Moderate mitral valve regurgitation. No evidence of mitral valve stenosis by observation. Tricuspid Valve: The tricuspid valve is normal in structure. Tricuspid valve regurgitation is moderate-severe. Aortic Valve: The aortic valve is normal in structure. Aortic valve regurgitation is not visualized. The aortic valve is structurally normal, with no evidence of sclerosis or stenosis. Pulmonic Valve: The pulmonic valve was normal in structure. Pulmonic valve regurgitation is not visualized. Pulmonic regurgitation is not visualized. Aorta: The aortic root, ascending aorta and aortic arch are all structurally normal, with no evidence of dilitation or obstruction. Venous: The inferior vena cava is dilated in size with less than 50% respiratory variability, suggesting right atrial pressure of 15 mmHg. IAS/Shunts: No atrial level shunt detected by color flow Doppler. There is no evidence of a patent foramen ovale. No ventricular septal defect is seen or detected. There is no evidence of an atrial septal defect.  LEFT VENTRICLE PLAX 2D LVIDd:         5.11 cm LVIDs:         3.29 cm LV PW:         1.09 cm LV  IVS:        0.97 cm LVOT diam:     1.95 cm LV SV:         81 ml LV SV Index:   32.79 LVOT Area:     2.99 cm  LEFT ATRIUM             Index LA diam:        5.30 cm 2.35 cm/m LA Vol (A2C):           56.90 ml/m LA Vol (A4C):           64.00 ml/m LA Biplane Vol:         59.60 ml/m  AORTIC VALVE LVOT Vmax:   115.00 cm/s LVOT Vmean:  65.600 cm/s LVOT VTI:    0.173 m  AORTA Ao Root diam: 3.60 cm Ao Asc diam:  3.40 cm MITRAL VALVE                        TRICUSPID VALVE MV Area (PHT): 5.88 cm             TV Peak grad:   22.8 mmHg MV PHT:        37.41 msec           TV Vmax:        2.39 m/s MV Decel Time: 129 msec MR Peak grad:    122.8 mmHg         SHUNTS MR Mean grad:    84.0 mmHg          Systemic VTI:  0.17 m MR Vmax:         554.00 cm/s        Systemic Diam: 1.95 cm MR Vmean:        436.0 cm/s MR PISA:         2.26 cm MR PISA Eff ROA: 9 mm MR PISA Radius:  0.60 cm MV E velocity: 133.00 cm/s 103 cm/s  Ida Rogue MD Electronically signed by Ida Rogue MD Signature Date/Time: 01/19/2020/6:16:24 PM    Final       Assessment and Plan:  Patient Active Problem List   Diagnosis Date Noted  . Nutritional deficiency 01/02/2020  . Memory deficit 01/02/2020  . Encounter for therapeutic drug level monitoring 01/02/2020  . B12 deficiency 01/02/2020  . PAD (peripheral artery disease) ([place]) 12/05/2019  . Intractable vomiting 10/09/2019  . Intestinal ischemia ([place])   . COPD with acute exacerbation ([place])   . Atrial fibrillation with rapid ventricular response ([place]) 09/11/2019  . Enteritis   . Peripheral edema 07/17/2019  . Atherosclerosis of both carotid arteries 07/17/2019  . Varicose veins of right lower extremity with inflammation 06/08/2019  . Gastroesophageal reflux disease with esophagitis 05/08/2019  . Fall at home, initial encounter 03/16/2019  . Contusion of right lower leg 03/16/2019  . Flu-like symptoms 02/03/2019  . Nausea 02/03/2019  . Suprapatellar bursitis of right knee 01/23/2019  .  Pain in right leg 01/23/2019  . Cellulitis of right leg 01/23/2019  . Chronic venous stasis dermatitis of right lower extremity 01/23/2019  . Oropharyngeal candidiasis 01/23/2019  . Cellulitis of head except face 12/23/2018  . Screening for breast cancer 08/15/2018  . Chronic bronchitis with acute exacerbation ([place]) 08/15/2018  . Vitamin D deficiency 08/15/2018  . Need for vaccination against Streptococcus pneumoniae using pneumococcal conjugate vaccine 13 08/15/2018  . Obstructive chronic bronchitis without exacerbation ([place]) 07/14/2018  . Body mass index (BMI) 45.0-49.9, adult 03/17/2018  . Asthma 02/25/2018  . Generalized anxiety disorder 12/16/2017  . Essential hypertension 12/16/2017  . Chronic respiratory failure with hypoxia ([place]) 12/16/2017  . Long term (current) use of anticoagulants 12/16/2017  . Chronic atrial fibrillation ([place]) 12/16/2017  . Other specified functional intestinal disorders 12/16/2017  . Gastro-esophageal reflux disease without esophagitis 12/16/2017  . Obstructive sleep apnea of adult 12/16/2017  . Allergic rhinitis due to animal (cat) (dog) hair and dander 12/16/2017  . Allergic rhinitis due to pollen 12/16/2017  . Severe persistent asthma without complication [id number]  . Cough 12/16/2017  . SOB (shortness of breath) 12/16/2017  . Superficial thrombophlebitis of right leg 11/17/2016  . Pain in limb 11/17/2016  . Chronic venous insufficiency 11/17/2016  . Swelling of limb 11/17/2016  . Cellulitis of buttock   . Cellulitis of right axilla   . Sepsis ([place]) 06/28/2016  . Cellulitis and abscess 06/28/2016  . Hypokalemia 06/28/2016  . Acute renal insufficiency 06/28/2016  . Hyperglycemia 06/28/2016  . Leukocytosis 06/28/2016  . Hypoxia 06/28/2016  . Collagenous colitis 05/13/2016    1. SOB (shortness of breath) With history of COVID19 and underlying chronic lung disease, concern for possible interstitial lung disease secondary to COVID19 infection.  Also concern for micro clotting in lungs, CT and CTA scans ordered to assess for these complications. Spirometry unable to be completed and 6 minute walk test not fully completed due to severe shortness of breath and chest tightness. Add pulmicort nebulizer to daily regimen. Follow-up after scans completed. - Spirometry with Graph - 6 minute walk; Future - CT Chest High Resolution; Future - CT Angio Chest W/Cm &/Or Wo Cm; Future - budesonide (PULMICORT) 0.25 MG/2ML nebulizer solution; Take 2 mLs (0.25 mg total) by nebulization 2 (two) times daily.  Dispense: 60 mL; Refill: 12  2. History of COVID-19 Diagnosis after hospital admission for emergency abdominal surgery. Concern for possible lung damage secondary to virus which could be attributing to respiratory symptoms at this time.  3. Atrial fibrillation with rapid ventricular response (HCC) Stable on current therapy, continue to monitor. On Eliquis for chronic anticoagulation therapy.  4. OSA on CPAP Reports nightly compliance  with wearing her CPAP, 2Lpm bleeding through CPAP each night. Cleaning her machine with machine, changes tubing and filters as directed. Continue to monitor.  General Counseling: I have discussed the findings of the evaluation and examination with [name].  I have also discussed any further diagnostic evaluation thatmay be needed or ordered today. Eleanna verbalizes understanding of the findings of todays visit. We also reviewed her medications today and discussed drug interactions and side effects including but not limited excessive drowsiness and altered mental states. We also discussed that there is always a risk not just to her but also people around her. she has been encouraged to call the office with any questions or concerns that should arise related to todays visit.  Orders Placed This Encounter  Procedures  . Spirometry with Graph    Order Specific Question:   Where should this test be performed?    Answer:    [facility]  . 6 minute walk    Standing Status:   Future    Standing Expiration Date:   02/01/2021     Time spent: 30 This patient was seen by Orson Gear AGNP-C in Collaboration with Dr. Devona Konig as a part of collaborative care agreement.  I have personally obtained a history, examined the patient, evaluated laboratory and imaging results, formulated the assessment and plan and placed orders.    Allyne Gee, MD [facility] Pulmonary and Critical Care Sleep medicine

## 2020-02-02 NOTE — Telephone Encounter (Signed)
PATIENT SCHEDULE FOR CTA CHEST ON 02/06/2019 @ 10:00 [place]/TAT

## 2020-02-02 NOTE — Telephone Encounter (Signed)
Pt advised we order ct angiogram and also we send nebulizer medicine to phar and tat called back with appt for CT

## 2020-02-03 ENCOUNTER — Telehealth: Payer: Self-pay | Admitting: Gastroenterology

## 2020-02-03 ENCOUNTER — Ambulatory Visit: Payer: BC Managed Care – PPO | Admitting: Gastroenterology

## 2020-02-03 MED ORDER — AZITHROMYCIN 500 MG PO TABS: 500.0000 mg | ORAL_TABLET | Freq: Every day | ORAL | 0 refills | Status: AC

## 2020-02-03 NOTE — Telephone Encounter (Signed)
Please see Dr. Tyrell Antonio response regarding the patient holding Digoxin below.

## 2020-02-03 NOTE — Telephone Encounter (Signed)
Spoke with pt and informed her that her cardiologist agrees with holding the Digoxin. I explained Dr. Georgeann Oppenheim directions to hold the Digoxin starting 3 days before commencing the Azithromycin then restart once she has completed the antibiotic. Pt understands and agrees.

## 2020-02-03 NOTE — Telephone Encounter (Signed)
Laura [name] from [place] [address] left vm he has received a prescription for zipromison and requires a Diagnosis code for this rx please call [phone]

## 2020-02-03 NOTE — Telephone Encounter (Signed)
Spoke with pharmacy and informed them of ICD 10.

## 2020-02-05 LAB — CALPROTECTIN, FECAL: Calprotectin, Fecal: 31 ug/g (ref 0–120)

## 2020-02-06 NOTE — Progress Notes (Signed)
Laura [name] did she get her antibiotics?

## 2020-02-07 ENCOUNTER — Other Ambulatory Visit: Payer: Self-pay

## 2020-02-07 ENCOUNTER — Telehealth: Payer: Self-pay

## 2020-02-07 ENCOUNTER — Ambulatory Visit
Admission: RE | Admit: 2020-02-07 | Discharge: 2020-02-07 | Disposition: A | Discharge status: Home / Self Care | Payer: BC Managed Care – PPO | Source: Ambulatory Visit | Attending: Adult Health | Admitting: Adult Health

## 2020-02-07 DIAGNOSIS — R0602 Shortness of breath: Secondary | ICD-10-CM | POA: Diagnosis not present

## 2020-02-07 MED ORDER — IOHEXOL 350 MG/ML SOLN
100.0000 mL | Freq: Once | INTRAVENOUS | Status: AC | PRN
  Administered 2020-02-07: 100 mL via INTRAVENOUS

## 2020-02-07 NOTE — Telephone Encounter (Signed)
Called lmom informing patient of appointment on 02/09/2020. klh

## 2020-02-09 ENCOUNTER — Ambulatory Visit (INDEPENDENT_AMBULATORY_CARE_PROVIDER_SITE_OTHER): Payer: BC Managed Care – PPO | Admitting: Internal Medicine

## 2020-02-09 ENCOUNTER — Other Ambulatory Visit: Payer: Self-pay

## 2020-02-09 ENCOUNTER — Encounter: Payer: Self-pay | Admitting: Internal Medicine

## 2020-02-09 VITALS — Ht 64.0 in | Wt 269.0 lb

## 2020-02-09 DIAGNOSIS — J4550 Severe persistent asthma, uncomplicated: Secondary | ICD-10-CM | POA: Diagnosis not present

## 2020-02-09 DIAGNOSIS — Z9989 Dependence on other enabling machines and devices: Secondary | ICD-10-CM

## 2020-02-09 DIAGNOSIS — G4733 Obstructive sleep apnea (adult) (pediatric): Secondary | ICD-10-CM | POA: Diagnosis not present

## 2020-02-09 DIAGNOSIS — F411 Generalized anxiety disorder: Secondary | ICD-10-CM

## 2020-02-09 DIAGNOSIS — J449 Chronic obstructive pulmonary disease, unspecified: Secondary | ICD-10-CM | POA: Diagnosis not present

## 2020-02-09 NOTE — Progress Notes (Signed)
[facility], [place] [address]  Internal MEDICINE  Telephone Visit  Patient Name: Laura [name]  [id number]  [id number]  Date of Service: 02/09/2020  I connected with the patient at 1158 by telephone and verified the patients identity using two identifiers.   I discussed the limitations, risks, security and privacy concerns of performing an evaluation and management service by telephone and the availability of in person appointments. I also discussed with the patient that there may be a patient responsible charge related to the service.  The patient expressed understanding and agrees to proceed.    Chief Complaint  Patient presents with  . Telephone Screen  . Telephone Assessment  . Follow-up    CT    HPI  Pt is seen via telephone. She is following up on the CTA she had done recently.  That CTA was negative for PE's.  However it did show an enlarged Pulmonary artery, and possible Pa HTN.     She reports her breathing, and Dyspnea have remained the same.  She is using budesonide Bid and reports some improvement with that.   Current Medication: Outpatient Encounter Medications as of 02/09/2020  Medication Sig  . albuterol (VENTOLIN HFA) 108 (90 Base) MCG/ACT inhaler Inhale 2 puffs into the lungs every 4 (four) hours as needed for wheezing or shortness of breath.  . ALPRAZolam (XANAX) 0.25 MG tablet Take 1 tablet (0.25 mg total) by mouth 2 (two) times daily.  Marland Kitchen apixaban (ELIQUIS) 5 MG TABS tablet Take 1 tablet (5 mg total) by mouth 2 (two) times daily.  . budesonide (PULMICORT) 0.25 MG/2ML nebulizer solution Take 2 mLs (0.25 mg total) by nebulization 2 (two) times daily.  . cholestyramine (QUESTRAN) 4 g packet Take 1 packet (4 g total) by mouth 2 (two) times daily.  Marland Kitchen dexlansoprazole (DEXILANT) 60 MG capsule Take 1 capsule (60 mg total) by mouth daily.  . digoxin (LANOXIN) 0.25 MG tablet Take 0.5 tablets (0.125 mg total) by mouth daily.  Marland Kitchen  diltiazem (CARDIZEM CD) 360 MG 24 hr capsule Take 1 capsule (360 mg total) by mouth daily.  Marland Kitchen EPINEPHrine 0.3 mg/0.3 mL IJ SOAJ injection Inject 0.3 mg into the muscle as needed for anaphylaxis.   Marland Kitchen ipratropium-albuterol (DUONEB) 0.5-2.5 (3) MG/3ML SOLN Take 3 mLs by nebulization every 4 (four) hours as needed. (Patient taking differently: Take 3 mLs by nebulization every 4 (four) hours as needed (shortness of breath / wheezing). )  . levalbuterol (XOPENEX) 0.31 MG/3ML nebulizer solution Take 3 mLs by nebulization every 4 (four) hours as needed. For wheezing/ and or shortness of breath.  . losartan (COZAAR) 25 MG tablet Take 1 tablet (25 mg total) by mouth daily.  . metaxalone (SKELAXIN) 800 MG tablet metaxalone 800 mg tablet  Take 1 tablet every 8 hours by oral route.  . montelukast (SINGULAIR) 10 MG tablet TAKE 1 TABLET BY MOUTH EVERY DAY (Patient taking differently: Take 10 mg by mouth at bedtime. )  . Multiple Vitamin (MULTIVITAMIN) tablet Take 1 tablet by mouth daily.  . ondansetron (ZOFRAN) 4 MG tablet Take 1 tablet (4 mg total) by mouth every 8 (eight) hours as needed for nausea or vomiting.  . predniSONE (DELTASONE) 10 MG tablet TAKE 1 TABLET (10 MG TOTAL) BY MOUTH DAILY WITH BREAKFAST.  Marland Kitchen Probiotic Product (PROBIOTIC-10) CAPS Take 1 capsule by mouth daily.   Marland Kitchen spironolactone (ALDACTONE) 25 MG tablet Take 1 tablet (25 mg total) by mouth 2 (two) times daily.  Marland Kitchen  theophylline (THEODUR) 300 MG 12 hr tablet Take 1 tablet (300 mg total) by mouth 2 (two) times daily.   Facility-Administered Encounter Medications as of 02/09/2020  Medication  . cyanocobalamin ((VITAMIN B-12)) injection 1,000 mcg    Surgical History: Past Surgical History:  Procedure Laterality Date  . BOWEL RESECTION  09/11/2019   Procedure: SMALL BOWEL RESECTION;  Surgeon: Herbert Pun, MD;  Location: [facility] ORS;  Service: General;;  . cataract surgery    . INCISION AND DRAINAGE ABSCESS Right 06/29/2016   Procedure:  INCISION AND DRAINAGE ABSCESS;  Surgeon: Florene Glen, MD;  Location: [facility] ORS;  Service: General;  Laterality: Right;  . INCISION AND DRAINAGE OF WOUND Left 06/29/2016   Procedure: IRRIGATION AND DEBRIDEMENT WOUND;  Surgeon: Florene Glen, MD;  Location: [facility] ORS;  Service: General;  Laterality: Left;  . LAPAROSCOPIC RIGHT COLECTOMY  09/11/2019   Procedure: RIGHT COLECTOMY;  Surgeon: Herbert Pun, MD;  Location: [facility] ORS;  Service: General;;  . LAPAROSCOPY N/A 09/11/2019   Procedure: LAPAROSCOPY DIAGNOSTIC;  Surgeon: Herbert Pun, MD;  Location: [facility] ORS;  Service: General;  Laterality: N/A;  . LAPAROTOMY N/A 09/13/2019   Procedure: REOPENING OF RECENT LAPAROTOMYANASTOMOSIS OF BOWEL;  Surgeon: Herbert Pun, MD;  Location: [facility] ORS;  Service: General;  Laterality: N/A;  . VISCERAL ANGIOGRAPHY N/A 09/12/2019   Procedure: VISCERAL ANGIOGRAPHY;  Surgeon: Algernon Huxley, MD;  Location: [place] [facility];  Service: Cardiovascular;  Laterality: N/A;    Medical History: Past Medical History:  Diagnosis Date  . Asthma   . Chronic atrial fibrillation ([place])    a. diagnosed in 09/2016; b. failed flecainide and propafenone due to LE swelling and SOB, could not afford Multaq; c. CHADS2VASc => 3 (HTN, age x 1, female); d. on Eliquis  . GERD (gastroesophageal reflux disease)   . History of stress test    a. Lexiscan Myoview 10/2016: no evidence of ischemia, EF 53%  . Hypertension   . Obesity   . Obstructive sleep apnea   . Pulmonary hypertension ([place])   . Systolic dysfunction    a. TTE 10/2016: EF 50%, mild LVH, moderately dilated LA, moderate MR/TR, mild pulmonary hypertension    Family History: Family History  Problem Relation Age of Onset  . Dementia Mother   . Osteoporosis Mother   . Vascular Disease Mother   . COPD Father     Social History   Socioeconomic History  . Marital status: Married    Spouse name: Not on file  . Number of children: Not on file   . Years of education: Not on file  . Highest education level: Not on file  Occupational History  . Not on file  Tobacco Use  . Smoking status: Never Smoker  . Smokeless tobacco: Never Used  Substance and Sexual Activity  . Alcohol use: No  . Drug use: No  . Sexual activity: Not on file  Other Topics Concern  . Not on file  Social History Narrative  . Not on file   Social Determinants of Health   Financial Resource Strain:   . Difficulty of Paying Living Expenses: Not on file  Food Insecurity:   . Worried About Charity fundraiser in the Last Year: Not on file  . Ran Out of Food in the Last Year: Not on file  Transportation Needs:   . Lack of Transportation (Medical): Not on file  . Lack of Transportation (Non-Medical): Not on file  Physical Activity:   . Days  of Exercise per Week: Not on file  . Minutes of Exercise per Session: Not on file  Stress:   . Feeling of Stress : Not on file  Social Connections:   . Frequency of Communication with Friends and Family: Not on file  . Frequency of Social Gatherings with Friends and Family: Not on file  . Attends Religious Services: Not on file  . Active Member of Clubs or Organizations: Not on file  . Attends Archivist Meetings: Not on file  . Marital Status: Not on file  Intimate Partner Violence:   . Fear of Current or Ex-Partner: Not on file  . Emotionally Abused: Not on file  . Physically Abused: Not on file  . Sexually Abused: Not on file      Review of Systems  Constitutional: Negative for chills, fatigue and unexpected weight change.  HENT: Negative for congestion, rhinorrhea, sneezing and sore throat.   Eyes: Negative for photophobia, pain and redness.  Respiratory: Negative for cough, chest tightness and shortness of breath.   Cardiovascular: Negative for chest pain and palpitations.  Gastrointestinal: Negative for abdominal pain, constipation, diarrhea, nausea and vomiting.  Endocrine: Negative.    Genitourinary: Negative for dysuria and frequency.  Musculoskeletal: Negative for arthralgias, back pain, joint swelling and neck pain.  Skin: Negative for rash.  Allergic/Immunologic: Negative.   Neurological: Negative for tremors and numbness.  Hematological: Negative for adenopathy. Does not bruise/bleed easily.  Psychiatric/Behavioral: Negative for behavioral problems and sleep disturbance. The patient is not nervous/anxious.     Vital Signs: Ht 5\' 4"  (1.626 m)   Wt 269 lb (122 kg)   BMI 46.17 kg/m    Observation/Objective:  Well appearing, NAD noted   Assessment/Plan: 1. OSA on CPAP Stable at this time.  Continue to use cpap as directed.   2. Obstructive chronic bronchitis without exacerbation (HCC) Continue current inhaler.  Stable symptoms at this time.  3. Severe persistent asthma without complication Continue with current treatment.   4. Generalized anxiety disorder Stable, continue present mgmt.   General Counseling: lashawne rowlee understanding of the findings of today's phone visit and agrees with plan of treatment. I have discussed any further diagnostic evaluation that may be needed or ordered today. We also reviewed her medications today. she has been encouraged to call the office with any questions or concerns that should arise related to todays visit.    No orders of the defined types were placed in this encounter.   No orders of the defined types were placed in this encounter.   Time spent:25 Minutes    Orson Gear AGNP-C Pulmonary Medicine

## 2020-02-15 NOTE — Telephone Encounter (Signed)
Spoke with the patient and mad her aware of Dr. Tyrell Antonio recommendation. Appt sch with Dr. Fletcher Anon on 02/21/20 @ 4:40pm. 03/02/20 appt with Christell Faith, PA cancelled. Patient is aware of the appt change and time.

## 2020-02-15 NOTE — Telephone Encounter (Signed)
-----   Message from Wellington Hampshire, MD sent at 02/15/2020 12:38 PM EST ----- Regarding: RE: possible RL Heart cath [facility] I will discuss with her.   Lattie Haw, Please schedule the patient for follow-up visit with me next week to discuss the possibility of a right and left cardiac cath.  She was supposed to follow-up with Thurmond Butts but we can cancel that once she is scheduled with me.  Thanks. ----- Message ----- From: Allyne Gee, MD Sent: 02/14/2020   3:21 PM EST To: Wellington Hampshire, MD Subject: possible RL Heart cath                         Hey  It looks likes this patient has significant SOB and was noted to have [place] on her CTA. I would like to consider doing a right and left heart cath. Possible post covid PAH or PE not diagnosed?

## 2020-02-16 ENCOUNTER — Ambulatory Visit (INDEPENDENT_AMBULATORY_CARE_PROVIDER_SITE_OTHER): Payer: BC Managed Care – PPO | Admitting: Gastroenterology

## 2020-02-16 DIAGNOSIS — R197 Diarrhea, unspecified: Secondary | ICD-10-CM | POA: Diagnosis not present

## 2020-02-16 NOTE — Progress Notes (Signed)
Laura [name] , MD [address]  [place]  [place], [place] [address]  Main: [phone]  Fax: [phone]   Primary Care Physician: [name], MD  Virtual Visit via Telephone Note  I connected with patient on 02/16/20 at  2:45 PM EST by telephone and verified that I am speaking with the correct person using two identifiers.   I discussed the limitations, risks, security and privacy concerns of performing an evaluation and management service by telephone and the availability of in person appointments. I also discussed with the patient that there may be a patient responsible charge related to this service. The patient expressed understanding and agreed to proceed.  Location of Patient: Home Location of Provider: Home Persons involved: Patient and provider only   History of Present Illness: Chief Complaint  Patient presents with  . Follow-up    Diarrhea    HPI: Laura [name] is a 69 y.o. female    Summary of history :  She was initially referred and seen on 01/25/2020 for diarrhea.She has previously been a patient of [facility] gastroenterology and was last seen at their office in September 2020.  She carries a diagnosis of collagenous colitis noted in 2017. Looking back at colon biopsy results from 2013 the biopsy results are not very specific for collagenous colitis. She was hospitalized in November 2020 and underwent resection of the small bowel due to ischemia.  During the surgery she underwent ileum and right colon resection.  Had a second look surgery.  09/18/2019: CT scan of the abdomen shows status post distal small bowel resection with right hemicolectomy and enterocolonic anastomosis.  01/02/2020: Ferritin 25.  B12 and folate normal.  Hemoglobin 10.7 g in January 2021 with an MCV of 85.  She says that prior to the surgery she was having features of constipation alternating with diarrhea.  Since the surgery she has been having only diarrhea up to 10-15 times a  day.  At times she has accidents and she has to wear a diaper to prevent soiling her undergarments.  She denies any use of any artificial sugars, NSAIDs, Metformin.  She wishes to transfer care to our care she was on a PPI which has been stopped.  Interval history   01/25/2020-02/16/2020  01/27/2020: Fecal calprotectin normal, celiac serology negative.  Stool for C. difficile toxin was negative.  GI PCR of her stool showed enteroaggregative E. coli positive.  Commenced on azithromycin.  Completed course of antibiotics.  Continue to have diarrhea.  Has been taking Questran 2 packets a day and despite which still having diarrhea.  Has added Imodium for the same.  Current Outpatient Medications  Medication Sig Dispense Refill  . albuterol (VENTOLIN HFA) 108 (90 Base) MCG/ACT inhaler Inhale 2 puffs into the lungs every 4 (four) hours as needed for wheezing or shortness of breath. 18 g 3  . ALPRAZolam (XANAX) 0.25 MG tablet Take 1 tablet (0.25 mg total) by mouth 2 (two) times daily. 45 tablet 3  . apixaban (ELIQUIS) 5 MG TABS tablet Take 1 tablet (5 mg total) by mouth 2 (two) times daily. 60 tablet 8  . budesonide (PULMICORT) 0.25 MG/2ML nebulizer solution Take 2 mLs (0.25 mg total) by nebulization 2 (two) times daily. 60 mL 12  . cholestyramine (QUESTRAN) 4 g packet Take 1 packet (4 g total) by mouth 2 (two) times daily. 60 packet 5  . dexlansoprazole (DEXILANT) 60 MG capsule Take 1 capsule (60 mg total) by mouth daily. [place]  capsule 0  . digoxin (LANOXIN) 0.25 MG tablet Take 0.5 tablets (0.125 mg total) by mouth daily. 15 tablet 5  . diltiazem (CARDIZEM CD) 360 MG 24 hr capsule Take 1 capsule (360 mg total) by mouth daily. 30 capsule 5  . EPINEPHrine 0.3 mg/0.3 mL IJ SOAJ injection Inject 0.3 mg into the muscle as needed for anaphylaxis.     Marland Kitchen ipratropium-albuterol (DUONEB) 0.5-2.5 (3) MG/3ML SOLN Take 3 mLs by nebulization every 4 (four) hours as needed. (Patient taking differently: Take 3 mLs by  nebulization every 4 (four) hours as needed (shortness of breath / wheezing). ) 120 mL 3  . levalbuterol (XOPENEX) 0.31 MG/3ML nebulizer solution Take 3 mLs by nebulization every 4 (four) hours as needed. For wheezing/ and or shortness of breath.    . losartan (COZAAR) 25 MG tablet Take 1 tablet (25 mg total) by mouth daily. 90 tablet 2  . metaxalone (SKELAXIN) 800 MG tablet metaxalone 800 mg tablet  Take 1 tablet every 8 hours by oral route.    . montelukast (SINGULAIR) 10 MG tablet TAKE 1 TABLET BY MOUTH EVERY DAY (Patient taking differently: Take 10 mg by mouth at bedtime. ) 90 tablet 3  . Multiple Vitamin (MULTIVITAMIN) tablet Take 1 tablet by mouth daily.    . ondansetron (ZOFRAN) 4 MG tablet Take 1 tablet (4 mg total) by mouth every 8 (eight) hours as needed for nausea or vomiting. 60 tablet 1  . predniSONE (DELTASONE) 10 MG tablet TAKE 1 TABLET (10 MG TOTAL) BY MOUTH DAILY WITH BREAKFAST. 90 tablet 1  . Probiotic Product (PROBIOTIC-10) CAPS Take 1 capsule by mouth daily.     Marland Kitchen spironolactone (ALDACTONE) 25 MG tablet Take 1 tablet (25 mg total) by mouth 2 (two) times daily. 180 tablet 0  . theophylline (THEODUR) 300 MG 12 hr tablet Take 1 tablet (300 mg total) by mouth 2 (two) times daily. 180 tablet 1   Current Facility-Administered Medications  Medication Dose Route Frequency Provider Last Rate Last Admin  . cyanocobalamin ((VITAMIN B-12)) injection 1,000 mcg  1,000 mcg Intramuscular Once Ronnell Freshwater, NP        Allergies as of 02/16/2020 - Review Complete 02/16/2020  Allergen Reaction Noted  . Flecainide Shortness Of Breath 11/27/2016  . Metoprolol Shortness Of Breath 01/13/2020  . Propafenone Shortness Of Breath and Swelling 12/01/2016  . Rivaroxaban Other (See Comments) 10/23/2016    Review of Systems:    All systems reviewed and negative except where noted in HPI.   Observations/Objective:  Labs: CMP     Component Value Date/Time   NA 143 01/02/2020 1452   NA 140  11/28/2013 2312   K 4.1 01/02/2020 1452   K 4.1 11/28/2013 2312   CL 106 01/02/2020 1452   CL 108 (H) 11/28/2013 2312   CO2 22 01/02/2020 1452   CO2 27 11/28/2013 2312   GLUCOSE 113 (H) 01/02/2020 1452   GLUCOSE 100 (H) 09/17/2019 0459   GLUCOSE 130 (H) 11/28/2013 2312   BUN 13 01/02/2020 1452   BUN 29 (H) 11/28/2013 2312   CREATININE 0.88 01/02/2020 1452   CREATININE 0.79 11/28/2013 2312   CALCIUM 9.3 01/02/2020 1452   CALCIUM 8.7 11/28/2013 2312   PROT 6.1 (L) 09/12/2019 0449   PROT 6.4 09/10/2018 0741   PROT 6.9 11/28/2013 2312   ALBUMIN 3.5 09/12/2019 0449   ALBUMIN 4.0 09/10/2018 0741   ALBUMIN 3.6 11/28/2013 2312   AST 21 09/12/2019 0449   AST 24 11/28/2013 2312  ALT 17 09/12/2019 0449   ALT 21 11/28/2013 2312   ALKPHOS 55 09/12/2019 0449   ALKPHOS 65 11/28/2013 2312   BILITOT 1.0 09/12/2019 0449   BILITOT 0.6 09/10/2018 0741   BILITOT 0.3 11/28/2013 2312   GFRNONAA 67 01/02/2020 1452   GFRNONAA >60 11/28/2013 2312   GFRAA 78 01/02/2020 1452   GFRAA >60 11/28/2013 2312   Lab Results  Component Value Date   WBC 7.8 01/02/2020   HGB 10.7 (L) 01/02/2020   HCT 33.5 (L) 01/02/2020   MCV 85 01/02/2020   PLT 346 01/02/2020    Imaging Studies: CT Angio Chest W/Cm &/Or Wo Cm  Result Date: 02/07/2020 CLINICAL DATA:  Shortness of breath EXAM: CT ANGIOGRAPHY CHEST WITH CONTRAST TECHNIQUE: Multidetector CT imaging of the chest was performed using the standard protocol during bolus administration of intravenous contrast. Multiplanar CT image reconstructions and MIPs were obtained to evaluate the vascular anatomy. CONTRAST:  157mL OMNIPAQUE IOHEXOL 350 MG/ML SOLN COMPARISON:  CT angiogram chest July lata 2017 FINDINGS: Cardiovascular: There is no demonstrable pulmonary embolus. There is no thoracic aortic aneurysm or dissection. Visualized great vessels appear unremarkable except for calcification at the origin of the left subclavian artery. There are foci of aortic  atherosclerosis. There are foci of coronary artery calcification. There is no pericardial effusion or pericardial thickening. The main pulmonary outflow tract measures 3.5 cm, prominent. Mediastinum/Nodes: Visualized thyroid appears normal. There is no appreciable thoracic adenopathy. No esophageal lesions are appreciable. Lungs/Pleura: There are scattered areas of atelectatic change. There is no edema or airspace opacity. No pleural effusions evident. Upper Abdomen: There is upper abdominal aortic atherosclerosis. There is a probable cyst in the right lobe of the liver measuring 7 mm. Visualized upper abdominal structures otherwise appear unremarkable. Musculoskeletal: There is degenerative change in the thoracic spine. No blastic or lytic bone lesions. No chest wall lesions evident. Review of the MIP images confirms the above findings. IMPRESSION: 1. No demonstrable pulmonary embolus. No thoracic aortic aneurysm or dissection. There is aortic atherosclerosis as well as foci of great vessel and coronary artery calcification. 2. Prominence of the main pulmonary outflow tract is indicative of a degree of pulmonary arterial hypertension. 3.  Areas of scattered atelectasis.  No edema or airspace opacity. 4.  No evident adenopathy. Aortic Atherosclerosis (ICD10-I70.0). Electronically Signed   By: Lowella Grip III M.D.   On: 02/07/2020 10:20   ECHOCARDIOGRAM COMPLETE  Result Date: 01/19/2020   ECHOCARDIOGRAM REPORT   Patient Name:   Laura [name] Date of Exam: 01/19/2020 Medical Rec #:  [id number]        Height:       64.5 in Accession #:    [id number]       Weight:       275.5 lb Date of Birth:  13-Jan-1951         BSA:          2.25 m Patient Age:    23 years         BP:           130/80 mmHg Patient Gender: F                HR:           100 bpm. [facility] Procedure: 2D Echo, Cardiac Doppler and Color Doppler Indications:    I48.2 Chronic atrial fibrillation  History:        Patient has prior  history of Echocardiogram examinations, most  recent 07/29/2019. COPD and PAD, Arrythmias:Atrial Fibrillation,                 Signs/Symptoms:Shortness of Breath and Dyspnea; Risk                 Factors:Hypertension and Non-Smoker. Patient's recent bowel                 surgery 08/2019 is preventing her medication to absorb correctly.                 She has increased SOB with her COPD.  Sonographer:    Salvadore Dom RVT, RDCS (AE), RDMS Referring Phys: [place]  1. Left ventricular ejection fraction, by visual estimation, is 40 to 45%. The left ventricle has mild to moderately decreased function. There is no left ventricular hypertrophy.  2. Global right ventricle has mildly reduced systolic function.The right ventricular size is mildly enlarged. No increase in right ventricular wall thickness.  3. Left ventricular diastolic parameters are indeterminate.  4. The left ventricle demonstrates global hypokinesis.  5. Left atrial size was severely dilated.  6. The inferior vena cava is dilated in size with <50% respiratory variability, suggesting right atrial pressure of 15 mmHg.  7. Rhythm is atrial fibrillation, rate 130 bpm. In comparison to the previous echocardiogram(s): EF 60%. FINDINGS  Left Ventricle: Left ventricular ejection fraction, by visual estimation, is 40 to 45%. The left ventricle has mild to moderately decreased function. The left ventricle demonstrates global hypokinesis. There is no left ventricular hypertrophy. Left ventricular diastolic parameters are indeterminate. Normal left atrial pressure. Right Ventricle: The right ventricular size is mildly enlarged. No increase in right ventricular wall thickness. Global RV systolic function is has mildly reduced systolic function. Left Atrium: Left atrial size was severely dilated. Right Atrium: Right atrial size was normal in size Pericardium: There is no evidence of pericardial effusion. Mitral Valve: The mitral  valve is normal in structure. Moderate mitral valve regurgitation. No evidence of mitral valve stenosis by observation. Tricuspid Valve: The tricuspid valve is normal in structure. Tricuspid valve regurgitation is moderate-severe. Aortic Valve: The aortic valve is normal in structure. Aortic valve regurgitation is not visualized. The aortic valve is structurally normal, with no evidence of sclerosis or stenosis. Pulmonic Valve: The pulmonic valve was normal in structure. Pulmonic valve regurgitation is not visualized. Pulmonic regurgitation is not visualized. Aorta: The aortic root, ascending aorta and aortic arch are all structurally normal, with no evidence of dilitation or obstruction. Venous: The inferior vena cava is dilated in size with less than 50% respiratory variability, suggesting right atrial pressure of 15 mmHg. IAS/Shunts: No atrial level shunt detected by color flow Doppler. There is no evidence of a patent foramen ovale. No ventricular septal defect is seen or detected. There is no evidence of an atrial septal defect.  LEFT VENTRICLE PLAX 2D LVIDd:         5.11 cm LVIDs:         3.29 cm LV PW:         1.09 cm LV IVS:        0.97 cm LVOT diam:     1.95 cm LV SV:         81 ml LV SV Index:   32.79 LVOT Area:     2.99 cm  LEFT ATRIUM             Index LA diam:        5.30 cm  2.35 cm/m LA Vol (A2C):           56.90 ml/m LA Vol (A4C):           64.00 ml/m LA Biplane Vol:         59.60 ml/m  AORTIC VALVE LVOT Vmax:   115.00 cm/s LVOT Vmean:  65.600 cm/s LVOT VTI:    0.173 m  AORTA Ao Root diam: 3.60 cm Ao Asc diam:  3.40 cm MITRAL VALVE                        TRICUSPID VALVE MV Area (PHT): 5.88 cm             TV Peak grad:   22.8 mmHg MV PHT:        37.41 msec           TV Vmax:        2.39 m/s MV Decel Time: 129 msec MR Peak grad:    122.8 mmHg         SHUNTS MR Mean grad:    84.0 mmHg          Systemic VTI:  0.17 m MR Vmax:         554.00 cm/s        Systemic Diam: 1.95 cm MR Vmean:        436.0  cm/s MR PISA:         2.26 cm MR PISA Eff ROA: 9 mm MR PISA Radius:  0.60 cm MV E velocity: 133.00 cm/s 103 cm/s  Ida Rogue MD Electronically signed by Ida Rogue MD Signature Date/Time: 01/19/2020/6:16:24 PM    Final     Assessment and Plan:   Laura [name] is a 69 y.o. y/o female here to follow-up for diarrhea.  She underwent a ileal and right colonic resection in September 2020 following an episode of bowel ischemia.  There is a mention of collagenous colitis in her last gastroenterologist notes but by reviewing the pathology report from her last colonoscopy in 2013 the biopsy results do not fit the condition completely. She could be having diarrhea from the ileal resection related to bile salt mediated.  She could also have diarrhea from an underlying condition that is yet to be diagnosed such as microscopic colitis. PPI has been stopped which I agree as it can cause microscopic colitis at times.  Plan 1.    Recheck stool to check for eradication of enteroaggregate to E. coli status post treatment.  If eradicated we will proceed with colonoscopy to rule out microscopic colitis.  If negative may consider trial of Creon or treatment of irritable bowel syndrome with diarrhea with Xifaxan.  Continue in the meanwhile to take Questran.  I have discussed alternative options, risks & benefits,  which include, but are not limited to, bleeding, infection, perforation,respiratory complication & drug reaction.  The patient agrees with this plan & written consent will be obtained.      I discussed the assessment and treatment plan with the patient. The patient was provided an opportunity to ask questions and all were answered. The patient agreed with the plan and demonstrated an understanding of the instructions.   The patient was advised to call back or seek an in-person evaluation if the symptoms worsen or if the condition fails to improve as anticipated.  I provided 11 minutes of  non-face-to-face time during this encounter.  Dr Laura Bellows MD,MRCP [facility]) Gastroenterology/Hepatology Pager: [phone]  Speech recognition software was used to dictate this note.

## 2020-02-17 ENCOUNTER — Other Ambulatory Visit: Payer: Self-pay | Admitting: Cardiovascular Disease

## 2020-02-17 ENCOUNTER — Telehealth: Payer: Self-pay

## 2020-02-17 ENCOUNTER — Other Ambulatory Visit: Payer: Self-pay

## 2020-02-17 DIAGNOSIS — R197 Diarrhea, unspecified: Secondary | ICD-10-CM

## 2020-02-17 MED ORDER — NA SULFATE-K SULFATE-MG SULF 17.5-3.13-1.6 GM/177ML PO SOLN: 1.0000 | Freq: Once | ORAL | 0 refills | Status: AC

## 2020-02-17 NOTE — Telephone Encounter (Signed)
Patient with diagnosis of afib on Eliquis for anticoagulation.    Procedure: Colonoscopy  Date of procedure: 03/06/20  CHADS2-VASc score of  5 (CHF, HTN, AGE, CAD, female)  CrCl 77 ml/min  Per office protocol, patient can hold Eliquis for 2 days prior to procedure.

## 2020-02-17 NOTE — Telephone Encounter (Signed)
Please review for refill on Digoxin. I do not see on Dr. Tyrell Antonio most recent office visit.

## 2020-02-17 NOTE — Telephone Encounter (Signed)
   [facility] Pre-operative Risk Assessment    Request for surgical clearance:  1. What type of surgery is being performed? Colonoscopy   2. When is this surgery scheduled?  03-06-20   3. What type of clearance is required (medical clearance vs. Pharmacy clearance to hold med vs. Both)? Both  4. Are there any medications that need to be held prior to surgery and how long? Eliquis 5 mg,  2 day hold  5. Practice name and name of physician performing surgery? [facility],  Dr. [name]   6. What is your office phone number [phone]    7.   What is your office fax number [phone]  8.   Anesthesia type (None, local, MAC, general) ? General   Laura [name] 02/17/2020, 9:37 AM  _________________________________________________________________   (provider comments below)

## 2020-02-17 NOTE — Telephone Encounter (Signed)
Chart reviewed. The patient was started on Digoxin 125 mcg once daily on 01/20/20 per her echo results.   Ok to refill medication.

## 2020-02-20 ENCOUNTER — Telehealth: Payer: Self-pay

## 2020-02-20 NOTE — Telephone Encounter (Signed)
LMOM FOR PATIENT TO CALL AND SCHEDULE PULMONARY CLEARANCE APPT FOR UPCOMING PROCEDURE WITH [facility] ON 03-06-20.

## 2020-02-21 ENCOUNTER — Ambulatory Visit (INDEPENDENT_AMBULATORY_CARE_PROVIDER_SITE_OTHER): Payer: BC Managed Care – PPO | Admitting: Cardiovascular Disease

## 2020-02-21 ENCOUNTER — Other Ambulatory Visit: Payer: Self-pay

## 2020-02-21 ENCOUNTER — Ambulatory Visit (INDEPENDENT_AMBULATORY_CARE_PROVIDER_SITE_OTHER): Payer: BC Managed Care – PPO | Admitting: Internal Medicine

## 2020-02-21 ENCOUNTER — Encounter: Payer: Self-pay | Admitting: Adult Health

## 2020-02-21 ENCOUNTER — Encounter: Payer: Self-pay | Admitting: Cardiovascular Disease

## 2020-02-21 ENCOUNTER — Telehealth: Payer: Self-pay

## 2020-02-21 ENCOUNTER — Other Ambulatory Visit
Admission: RE | Admit: 2020-02-21 | Discharge: 2020-02-21 | Disposition: A | Discharge status: Home / Self Care | Payer: BC Managed Care – PPO | Source: Ambulatory Visit | Attending: Cardiovascular Disease | Admitting: Cardiovascular Disease

## 2020-02-21 VITALS — BP 150/82 | HR 56 | Temp 96.7°F | Resp 16 | Ht 64.0 in | Wt 265.6 lb

## 2020-02-21 VITALS — BP 168/80 | HR 97 | Ht 64.0 in | Wt 267.0 lb

## 2020-02-21 DIAGNOSIS — R079 Chest pain, unspecified: Secondary | ICD-10-CM | POA: Diagnosis not present

## 2020-02-21 DIAGNOSIS — I482 Chronic atrial fibrillation, unspecified: Secondary | ICD-10-CM

## 2020-02-21 DIAGNOSIS — J449 Chronic obstructive pulmonary disease, unspecified: Secondary | ICD-10-CM

## 2020-02-21 DIAGNOSIS — G4733 Obstructive sleep apnea (adult) (pediatric): Secondary | ICD-10-CM | POA: Diagnosis not present

## 2020-02-21 DIAGNOSIS — I272 Pulmonary hypertension, unspecified: Secondary | ICD-10-CM | POA: Diagnosis not present

## 2020-02-21 DIAGNOSIS — J4550 Severe persistent asthma, uncomplicated: Secondary | ICD-10-CM

## 2020-02-21 DIAGNOSIS — I10 Essential (primary) hypertension: Secondary | ICD-10-CM

## 2020-02-21 DIAGNOSIS — R0602 Shortness of breath: Secondary | ICD-10-CM

## 2020-02-21 DIAGNOSIS — Z9989 Dependence on other enabling machines and devices: Secondary | ICD-10-CM

## 2020-02-21 DIAGNOSIS — I5022 Chronic systolic (congestive) heart failure: Secondary | ICD-10-CM

## 2020-02-21 LAB — CBC WITH DIFFERENTIAL/PLATELET
Abs Immature Granulocytes: 0.05 10*3/uL (ref 0.00–0.07)
Basophils Absolute: 0 10*3/uL (ref 0.0–0.1)
Basophils Relative: 0 %
Eosinophils Absolute: 0.1 10*3/uL (ref 0.0–0.5)
Eosinophils Relative: 1 %
HCT: 42.5 % (ref 36.0–46.0)
Hemoglobin: 13.5 g/dL (ref 12.0–15.0)
Immature Granulocytes: 1 %
Lymphocytes Relative: 28 %
Lymphs Abs: 2.7 10*3/uL (ref 0.7–4.0)
MCH: 28.4 pg (ref 26.0–34.0)
MCHC: 31.8 g/dL (ref 30.0–36.0)
MCV: 89.5 fL (ref 80.0–100.0)
Monocytes Absolute: 0.6 10*3/uL (ref 0.1–1.0)
Monocytes Relative: 6 %
Neutro Abs: 6.1 10*3/uL (ref 1.7–7.7)
Neutrophils Relative %: 64 %
Platelets: 315 10*3/uL (ref 150–400)
RBC: 4.75 MIL/uL (ref 3.87–5.11)
RDW: 15.9 % — ABNORMAL HIGH (ref 11.5–15.5)
WBC: 9.6 10*3/uL (ref 4.0–10.5)
nRBC: 0 % (ref 0.0–0.2)

## 2020-02-21 LAB — BASIC METABOLIC PANEL
Anion gap: 8 (ref 5–15)
BUN: 19 mg/dL (ref 8–23)
CO2: 23 mmol/L (ref 22–32)
Calcium: 9.4 mg/dL (ref 8.9–10.3)
Chloride: 106 mmol/L (ref 98–111)
Creatinine, Ser: 0.91 mg/dL (ref 0.44–1.00)
GFR calc Af Amer: >60 mL/min (ref >60)
GFR calc non Af Amer: >60 mL/min (ref >60)
Glucose, Bld: 104 mg/dL — ABNORMAL HIGH (ref 70–99)
Potassium: 4.8 mmol/L (ref 3.5–5.1)
Sodium: 137 mmol/L (ref 135–145)

## 2020-02-21 NOTE — Telephone Encounter (Signed)
SURGICAL CLEARANCE FORM FAXED TO [facility].

## 2020-02-21 NOTE — Progress Notes (Signed)
[facility] [place], [place] [address]  Pulmonary Sleep Medicine   Office Visit Note  Patient Name: Laura [name] DOB: 28-Feb-1951 MRN [id number]  Date of Service: 02/21/2020  Complaints/HPI: Pt is here for pulmonary clearance for colonoscopy on 03/06/20.  Her FEV1 on spiro today is 1.6, she reports her sob has improved since starting Pulmicort nebulizer.  She does them twice daily.  She continues to have some Sob, and is seeing cardiology today for evaluation.     ROS  General: (-) fever, (-) chills, (-) night sweats, (-) weakness Skin: (-) rashes, (-) itching,. Eyes: (-) visual changes, (-) redness, (-) itching. Nose and Sinuses: (-) nasal stuffiness or itchiness, (-) postnasal drip, (-) nosebleeds, (-) sinus trouble. Mouth and Throat: (-) sore throat, (-) hoarseness. Neck: (-) swollen glands, (-) enlarged thyroid, (-) neck pain. Respiratory: - cough, (-) bloody sputum, +shortness of breath, - wheezing. Cardiovascular: - ankle swelling, (-) chest pain. Lymphatic: (-) lymph node enlargement. Neurologic: (-) numbness, (-) tingling. Psychiatric: (-) anxiety, (-) depression   Current Medication: Outpatient Encounter Medications as of 02/21/2020  Medication Sig  . albuterol (VENTOLIN HFA) 108 (90 Base) MCG/ACT inhaler Inhale 2 puffs into the lungs every 4 (four) hours as needed for wheezing or shortness of breath.  . ALPRAZolam (XANAX) 0.25 MG tablet Take 1 tablet (0.25 mg total) by mouth 2 (two) times daily.  Marland Kitchen apixaban (ELIQUIS) 5 MG TABS tablet Take 1 tablet (5 mg total) by mouth 2 (two) times daily.  . budesonide (PULMICORT) 0.25 MG/2ML nebulizer solution Take 2 mLs (0.25 mg total) by nebulization 2 (two) times daily.  . cholestyramine (QUESTRAN) 4 g packet Take 1 packet (4 g total) by mouth 2 (two) times daily.  Marland Kitchen dexlansoprazole (DEXILANT) 60 MG capsule Take 1 capsule (60 mg total) by mouth daily.  . digoxin (LANOXIN) 0.25 MG tablet Take 0.5 tablet  (0.125 mg) by mouth once daily  . diltiazem (CARDIZEM CD) 360 MG 24 hr capsule Take 1 capsule (360 mg total) by mouth daily.  Marland Kitchen EPINEPHrine 0.3 mg/0.3 mL IJ SOAJ injection Inject 0.3 mg into the muscle as needed for anaphylaxis.   Marland Kitchen ipratropium-albuterol (DUONEB) 0.5-2.5 (3) MG/3ML SOLN Take 3 mLs by nebulization every 4 (four) hours as needed. (Patient taking differently: Take 3 mLs by nebulization every 4 (four) hours as needed (shortness of breath / wheezing). )  . levalbuterol (XOPENEX) 0.31 MG/3ML nebulizer solution Take 3 mLs by nebulization every 4 (four) hours as needed. For wheezing/ and or shortness of breath.  . losartan (COZAAR) 25 MG tablet Take 1 tablet (25 mg total) by mouth daily.  . metaxalone (SKELAXIN) 800 MG tablet metaxalone 800 mg tablet  Take 1 tablet every 8 hours by oral route.  . montelukast (SINGULAIR) 10 MG tablet TAKE 1 TABLET BY MOUTH EVERY DAY (Patient taking differently: Take 10 mg by mouth at bedtime. )  . Multiple Vitamin (MULTIVITAMIN) tablet Take 1 tablet by mouth daily.  . ondansetron (ZOFRAN) 4 MG tablet Take 1 tablet (4 mg total) by mouth every 8 (eight) hours as needed for nausea or vomiting.  . predniSONE (DELTASONE) 10 MG tablet TAKE 1 TABLET (10 MG TOTAL) BY MOUTH DAILY WITH BREAKFAST.  Marland Kitchen Probiotic Product (PROBIOTIC-10) CAPS Take 1 capsule by mouth daily.   Marland Kitchen spironolactone (ALDACTONE) 25 MG tablet Take 1 tablet (25 mg total) by mouth 2 (two) times daily.  . theophylline (THEODUR) 300 MG 12 hr tablet Take 1 tablet (300 mg total)  by mouth 2 (two) times daily.   Facility-Administered Encounter Medications as of 02/21/2020  Medication  . cyanocobalamin ((VITAMIN B-12)) injection 1,000 mcg    Surgical History: Past Surgical History:  Procedure Laterality Date  . BOWEL RESECTION  09/11/2019   Procedure: SMALL BOWEL RESECTION;  Surgeon: Herbert Pun, MD;  Location: [facility] ORS;  Service: General;;  . cataract surgery    . INCISION AND DRAINAGE  ABSCESS Right 06/29/2016   Procedure: INCISION AND DRAINAGE ABSCESS;  Surgeon: Florene Glen, MD;  Location: [facility] ORS;  Service: General;  Laterality: Right;  . INCISION AND DRAINAGE OF WOUND Left 06/29/2016   Procedure: IRRIGATION AND DEBRIDEMENT WOUND;  Surgeon: Florene Glen, MD;  Location: [facility];  Service: General;  Laterality: Left;  . LAPAROSCOPIC RIGHT COLECTOMY  09/11/2019   Procedure: RIGHT COLECTOMY;  Surgeon: Herbert Pun, MD;  Location: [facility] OR[facility];  Service: General;;  . LAPAROSCOPY N/A 09/11/2019   Procedure: LAPAROSCOPY DIAGNOSTIC;  Surgeon: Herbert Pun, MD;  Location: [facility] OR[facility];  Service: General;  Laterality: N/A;  . LAPAROTOMY N/A 09/13/2019   Procedure: REOPENING OF RECENT LAPAROTOMYANASTOMOSIS OF BOWEL;  Surgeon: Herbert Pun, MD;  Location: [facility];  Service: General;  Laterality: N/A;  . VISCERAL ANGIOGRAPHY N/A 09/12/2019   Procedure: VISCERAL ANGIOGRAPHY;  Surgeon: Algernon Huxley, MD;  Location: [facility];  Service: Cardiovascular;  Laterality: N/A;    Medical History: Past Medical History:  Diagnosis Date  . Asthma   . Chronic atrial fibrillation ([place])    a. diagnosed in 09/2016; b. failed flecainide and propafenone due to LE swelling and SOB, could not afford Multaq; c. CHADS2VASc => 3 (HTN, age x 1, female); d. on Eliquis  . GERD (gastroesophageal reflux disease)   . History of stress test    a. Lexiscan Myoview 10/2016: no evidence of ischemia, EF 53%  . Hypertension   . Obesity   . Obstructive sleep apnea   . Pulmonary hypertension ([place])   . Systolic dysfunction    a. TTE 10/2016: EF 50%, mild LVH, moderately dilated LA, moderate MR/TR, mild pulmonary hypertension    Family History: Family History  Problem Relation Age of Onset  . Dementia Mother   . Osteoporosis Mother   . Vascular Disease Mother   . COPD Father     Social History: Social History   Socioeconomic History  . Marital status: Married    Spouse  name: Not on file  . Number of children: Not on file  . Years of education: Not on file  . Highest education level: Not on file  Occupational History  . Not on file  Tobacco Use  . Smoking status: Never Smoker  . Smokeless tobacco: Never Used  Substance and Sexual Activity  . Alcohol use: No  . Drug use: No  . Sexual activity: Not on file  Other Topics Concern  . Not on file  Social History Narrative  . Not on file   Social Determinants of Health   Financial Resource Strain:   . Difficulty of Paying Living Expenses: Not on file  Food Insecurity:   . Worried About Charity fundraiser in the Last Year: Not on file  . Ran Out of Food in the Last Year: Not on file  Transportation Needs:   . Lack of Transportation (Medical): Not on file  . Lack of Transportation (Non-Medical): Not on file  Physical Activity:   . Days of Exercise per Week: Not on file  . Minutes of  Exercise per Session: Not on file  Stress:   . Feeling of Stress : Not on file  Social Connections:   . Frequency of Communication with Friends and Family: Not on file  . Frequency of Social Gatherings with Friends and Family: Not on file  . Attends Religious Services: Not on file  . Active Member of Clubs or Organizations: Not on file  . Attends Archivist Meetings: Not on file  . Marital Status: Not on file  Intimate Partner Violence:   . Fear of Current or Ex-Partner: Not on file  . Emotionally Abused: Not on file  . Physically Abused: Not on file  . Sexually Abused: Not on file    Vital Signs: Blood pressure (!) 150/82, pulse (!) 56, temperature (!) 96.7 F (35.9 C), resp. rate 16, height 5\' 4"  (1.626 m), weight 265 lb 9.6 oz (120.5 kg), SpO2 98 %.  Examination: General Appearance: The patient is well-developed, well-nourished, and in no distress. Skin: Gross inspection of skin unremarkable. Head: normocephalic, no gross deformities. Eyes: no gross deformities noted. ENT: ears appear grossly  normal no exudates. Neck: Supple. No thyromegaly. No LAD. Respiratory: clear bilaterally. Cardiovascular: Normal S1 and S2 without murmur or rub. Extremities: No cyanosis. pulses are equal. Neurologic: Alert and oriented. No involuntary movements.  LABS: Recent Results (from the past 2160 hour(s))  CBC     Status: Abnormal   Collection Time: 01/02/20  2:52 PM  Result Value Ref Range   WBC 7.8 3.4 - 10.8 x10E3/uL   RBC 3.93 3.77 - 5.28 x10E6/uL   Hemoglobin 10.7 (L) 11.1 - 15.9 g/dL   Hematocrit 33.5 (L) 34.0 - 46.6 %   MCV 85 79 - 97 fL   MCH 27.2 26.6 - 33.0 pg   MCHC 31.9 31.5 - 35.7 g/dL   RDW 14.6 11.7 - 15.4 %   Platelets 346 150 - 450 [id number]  Basic metabolic panel     Status: Abnormal   Collection Time: 01/02/20  2:52 PM  Result Value Ref Range   Glucose 113 (H) 65 - 99 mg/dL   BUN 13 8 - 27 mg/dL   Creatinine, Ser 0.88 0.57 - 1.00 mg/dL   GFR calc non Af Amer 67 >59 mL/min/1.73   GFR calc Af Amer 78 >59 mL/min/1.73   BUN/Creatinine Ratio 15 12 - 28   Sodium 143 134 - 144 mmol/L   Potassium 4.1 3.5 - 5.2 mmol/L   Chloride 106 96 - 106 mmol/L   CO2 22 20 - 29 mmol/L   Calcium 9.3 8.7 - 10.3 mg/dL  Iron and TIBC     Status: Abnormal   Collection Time: 01/02/20  2:52 PM  Result Value Ref Range   Total Iron Binding Capacity 412 250 - 450 ug/dL   UIBC 185 118 - 369 ug/dL   Iron 227 (H) 27 - 139 ug/dL   Iron Saturation 55 15 - 55 %  B12 and Folate Panel     Status: Abnormal   Collection Time: 01/02/20  2:52 PM  Result Value Ref Range   Vitamin B-12 >2000 (H) 232 - 1245 pg/mL   Folate >20.0 >3.0 ng/mL    Comment: A serum folate concentration of less than 3.1 ng/mL is considered to represent clinical deficiency.   Hgb A1c w/o eAG     Status: Abnormal   Collection Time: 01/02/20  2:52 PM  Result Value Ref Range   Hgb A1c MFr Bld 5.9 (H) 4.8 - 5.6 %  Comment:          Prediabetes: 5.7 - 6.4          Diabetes: >6.4          Glycemic control for adults with  diabetes: <7.0   T4, free     Status: None   Collection Time: 01/02/20  2:52 PM  Result Value Ref Range   Free T4 1.48 0.82 - 1.77 ng/dL  TSH     Status: None   Collection Time: 01/02/20  2:52 PM  Result Value Ref Range   TSH 1.810 0.450 - 4.500 uIU/mL  Theophylline level     Status: Abnormal   Collection Time: 01/02/20  2:52 PM  Result Value Ref Range   Theophylline, Serum 6.1 (L) 10.0 - 20.0 ug/mL    Comment:                                 Detection Limit =  0.8                           <0.8 indicates None Detected   VITAMIN D 25 Hydroxy (Vit-D Deficiency, Fractures)     Status: Abnormal   Collection Time: 01/02/20  2:52 PM  Result Value Ref Range   Vit D, 25-Hydroxy 20.0 (L) 30.0 - 100.0 ng/mL    Comment: Vitamin D deficiency has been defined by the [place] and an Endocrine Society practice guideline as a level of serum 25-OH vitamin D less than 20 ng/mL (1,2). The Endocrine Society went on to further define vitamin D insufficiency as a level between 21 and 29 ng/mL (2). 1. IOM (Institute of Medicine). 2010. Dietary reference    intakes for calcium and D. [place]: The    [employer]. 2. Holick MF, Binkley [place], Bischoff-Ferrari HA, et al.    Evaluation, treatment, and prevention of vitamin D    deficiency: an Endocrine Society clinical practice    guideline. JCEM. 2011 Jul; 96(7):1911-30.   Phosphorus     Status: None   Collection Time: 01/02/20  2:52 PM  Result Value Ref Range   Phosphorus 4.1 3.0 - 4.3 mg/dL  RPR Qual     Status: None   Collection Time: 01/02/20  2:52 PM  Result Value Ref Range   RPR Ser Ql Non Reactive Non Reactive  Magnesium     Status: None   Collection Time: 01/02/20  2:52 PM  Result Value Ref Range   Magnesium 2.1 1.6 - 2.3 mg/dL  Ferritin     Status: None   Collection Time: 01/02/20  2:52 PM  Result Value Ref Range   Ferritin 25 15 - 150 ng/mL  Celiac Disease Ab Screen w/Rfx     Status: None   Collection  Time: 01/25/20  4:27 PM  Result Value Ref Range   Antigliadin Abs, IgA 3 0 - 19 units    Comment:                    Negative                   0 - 19                    Weak Positive             20 - 30  Moderate to Strong Positive   >30    Transglutaminase IgA <2 0 - 3 U/mL    Comment:                               Negative        0 -  3                               Weak Positive   4 - 10                               Positive           >10  Tissue Transglutaminase (tTG) has been identified  as the endomysial antigen.  Studies have demonstr-  ated that endomysial IgA antibodies have over 99%  specificity for gluten sensitive enteropathy.    IgA/Immunoglobulin A, Serum 226 87 - 352 mg/dL  Calprotectin, Fecal     Status: None   Collection Time: 01/27/20 12:00 AM   Specimen: Stool   STOOL  Result Value Ref Range   Calprotectin, Fecal 31 0 - 120 ug/g    Comment: Concentration     Interpretation   Follow-Up <16 - 50 ug/g     Normal           None >50 -120 ug/g     Borderline       Re-evaluate in 4-6 weeks     >120 ug/g     Abnormal         Repeat as clinically                                    indicated   C difficile Toxins A+B W/Rflx     Status: None   Collection Time: 01/27/20 12:00 AM   Specimen: Stool   STOOL  Result Value Ref Range   C difficile Toxins A+B, EIA Negative Negative  GI Profile, Stool, PCR     Status: Abnormal   Collection Time: 01/27/20 12:00 AM  Result Value Ref Range   Campylobacter Not Detected Not Detected   C difficile toxin A/B Not Detected Not Detected   Plesiomonas shigelloides Not Detected Not Detected   Salmonella Not Detected Not Detected   Vibrio Not Detected Not Detected   Vibrio cholerae Not Detected Not Detected   Yersinia enterocolitica Not Detected Not Detected   Enteroaggregative E coli Detected (A) Not Detected   Enteropathogenic E coli Not Detected Not Detected   Enterotoxigenic E coli Not Detected Not Detected    Shiga-toxin-producing E coli Not Detected Not Detected   E coli [id number] Not applicable Not Detected   Shigella/Enteroinvasive E coli Not Detected Not Detected   Cryptosporidium Not Detected Not Detected   Cyclospora cayetanensis Not Detected Not Detected   Entamoeba histolytica Not Detected Not Detected   Giardia lamblia Not Detected Not Detected   Adenovirus F 40/41 Not Detected Not Detected   Astrovirus Not Detected Not Detected   Norovirus GI/GII Not Detected Not Detected   Rotavirus A Not Detected Not Detected   Sapovirus Not Detected Not Detected  C difficile, Cytotoxin B     Status: None   Collection Time: 01/27/20 12:00 AM   STOOL  Result Value Ref Range  C diff Toxin B Final report     Comment:                             Reference Range: Negative   Result 1 Comment     Comment: Negative No cytotoxin detected.   Specimen status report     Status: None   Collection Time: 01/27/20 12:00 AM  Result Value Ref Range   specimen status report Comment     Comment: Please note Please note The date and/or time of collection was not indicated on the requisition as required by state and federal law.  The date of receipt of the specimen was used as the collection date if not supplied.     Radiology: CT Angio Chest W/Cm &/Or Wo Cm  Result Date: 02/07/2020 CLINICAL DATA:  Shortness of breath EXAM: CT ANGIOGRAPHY CHEST WITH CONTRAST TECHNIQUE: Multidetector CT imaging of the chest was performed using the standard protocol during bolus administration of intravenous contrast. Multiplanar CT image reconstructions and MIPs were obtained to evaluate the vascular anatomy. CONTRAST:  178mL OMNIPAQUE IOHEXOL 350 MG/ML SOLN COMPARISON:  CT angiogram chest July lata 2017 FINDINGS: Cardiovascular: There is no demonstrable pulmonary embolus. There is no thoracic aortic aneurysm or dissection. Visualized great vessels appear unremarkable except for calcification at the origin of the left subclavian  artery. There are foci of aortic atherosclerosis. There are foci of coronary artery calcification. There is no pericardial effusion or pericardial thickening. The main pulmonary outflow tract measures 3.5 cm, prominent. Mediastinum/Nodes: Visualized thyroid appears normal. There is no appreciable thoracic adenopathy. No esophageal lesions are appreciable. Lungs/Pleura: There are scattered areas of atelectatic change. There is no edema or airspace opacity. No pleural effusions evident. Upper Abdomen: There is upper abdominal aortic atherosclerosis. There is a probable cyst in the right lobe of the liver measuring 7 mm. Visualized upper abdominal structures otherwise appear unremarkable. Musculoskeletal: There is degenerative change in the thoracic spine. No blastic or lytic bone lesions. No chest wall lesions evident. Review of the MIP images confirms the above findings. IMPRESSION: 1. No demonstrable pulmonary embolus. No thoracic aortic aneurysm or dissection. There is aortic atherosclerosis as well as foci of great vessel and coronary artery calcification. 2. Prominence of the main pulmonary outflow tract is indicative of a degree of pulmonary arterial hypertension. 3.  Areas of scattered atelectasis.  No edema or airspace opacity. 4.  No evident adenopathy. Aortic Atherosclerosis (ICD10-I70.0). Electronically Signed   By: Lowella Grip III M.D.   On: 02/07/2020 10:20    No results found.  CT Angio Chest W/Cm &/Or Wo Cm  Result Date: 02/07/2020 CLINICAL DATA:  Shortness of breath EXAM: CT ANGIOGRAPHY CHEST WITH CONTRAST TECHNIQUE: Multidetector CT imaging of the chest was performed using the standard protocol during bolus administration of intravenous contrast. Multiplanar CT image reconstructions and MIPs were obtained to evaluate the vascular anatomy. CONTRAST:  140mL OMNIPAQUE IOHEXOL 350 MG/ML SOLN COMPARISON:  CT angiogram chest July lata 2017 FINDINGS: Cardiovascular: There is no demonstrable  pulmonary embolus. There is no thoracic aortic aneurysm or dissection. Visualized great vessels appear unremarkable except for calcification at the origin of the left subclavian artery. There are foci of aortic atherosclerosis. There are foci of coronary artery calcification. There is no pericardial effusion or pericardial thickening. The main pulmonary outflow tract measures 3.5 cm, prominent. Mediastinum/Nodes: Visualized thyroid appears normal. There is no appreciable thoracic adenopathy. No esophageal lesions are appreciable. Lungs/Pleura:  There are scattered areas of atelectatic change. There is no edema or airspace opacity. No pleural effusions evident. Upper Abdomen: There is upper abdominal aortic atherosclerosis. There is a probable cyst in the right lobe of the liver measuring 7 mm. Visualized upper abdominal structures otherwise appear unremarkable. Musculoskeletal: There is degenerative change in the thoracic spine. No blastic or lytic bone lesions. No chest wall lesions evident. Review of the MIP images confirms the above findings. IMPRESSION: 1. No demonstrable pulmonary embolus. No thoracic aortic aneurysm or dissection. There is aortic atherosclerosis as well as foci of great vessel and coronary artery calcification. 2. Prominence of the main pulmonary outflow tract is indicative of a degree of pulmonary arterial hypertension. 3.  Areas of scattered atelectasis.  No edema or airspace opacity. 4.  No evident adenopathy. Aortic Atherosclerosis (ICD10-I70.0). Electronically Signed   By: Lowella Grip III M.D.   On: 02/07/2020 10:20      Assessment and Plan: Patient Active Problem List   Diagnosis Date Noted  . Nutritional deficiency 01/02/2020  . Memory deficit 01/02/2020  . Encounter for therapeutic drug level monitoring 01/02/2020  . B12 deficiency 01/02/2020  . PAD (peripheral artery disease) ([place]) 12/05/2019  . Intractable vomiting 10/09/2019  . Intestinal ischemia ([place])   . COPD  with acute exacerbation ([place])   . Atrial fibrillation with rapid ventricular response ([place]) 09/11/2019  . Enteritis   . Peripheral edema 07/17/2019  . Atherosclerosis of both carotid arteries 07/17/2019  . Varicose veins of right lower extremity with inflammation 06/08/2019  . Gastroesophageal reflux disease with esophagitis 05/08/2019  . Fall at home, initial encounter 03/16/2019  . Contusion of right lower leg 03/16/2019  . Flu-like symptoms 02/03/2019  . Nausea 02/03/2019  . Suprapatellar bursitis of right knee 01/23/2019  . Pain in right leg 01/23/2019  . Cellulitis of right leg 01/23/2019  . Chronic venous stasis dermatitis of right lower extremity 01/23/2019  . Oropharyngeal candidiasis 01/23/2019  . Cellulitis of head except face 12/23/2018  . Screening for breast cancer 08/15/2018  . Chronic bronchitis with acute exacerbation ([place]) 08/15/2018  . Vitamin D deficiency 08/15/2018  . Need for vaccination against Streptococcus pneumoniae using pneumococcal conjugate vaccine 13 08/15/2018  . Obstructive chronic bronchitis without exacerbation ([place]) 07/14/2018  . Body mass index (BMI) 45.0-49.9, adult 03/17/2018  . Asthma 02/25/2018  . Generalized anxiety disorder 12/16/2017  . Essential hypertension 12/16/2017  . Chronic respiratory failure with hypoxia ([place]) 12/16/2017  . Long term (current) use of anticoagulants 12/16/2017  . Chronic atrial fibrillation ([place]) 12/16/2017  . Other specified functional intestinal disorders 12/16/2017  . Gastro-esophageal reflux disease without esophagitis 12/16/2017  . Obstructive sleep apnea of adult 12/16/2017  . Allergic rhinitis due to animal (cat) (dog) hair and dander 12/16/2017  . Allergic rhinitis due to pollen 12/16/2017  . Severe persistent asthma without complication [id number]  . Cough 12/16/2017  . SOB (shortness of breath) 12/16/2017  . Superficial thrombophlebitis of right leg 11/17/2016  . Pain in limb 11/17/2016  . Chronic  venous insufficiency 11/17/2016  . Swelling of limb 11/17/2016  . Cellulitis of buttock   . Cellulitis of right axilla   . Sepsis ([place]) 06/28/2016  . Cellulitis and abscess 06/28/2016  . Hypokalemia 06/28/2016  . Acute renal insufficiency 06/28/2016  . Hyperglycemia 06/28/2016  . Leukocytosis 06/28/2016  . Hypoxia 06/28/2016  . Collagenous colitis 05/13/2016    1. Obstructive chronic bronchitis without exacerbation (H[facility]) Improved with Pulmicort.  Continue to use as prescribed.  2. Severe persistent asthma without complication Controlled, continue present mgmt.   3. OSA on CPAP Continue to use cpap as directed.   4. SOB (shortness of breath) Fev1 greater than one liter, improved sob.  Clear for endoscopy/colonoscopy. - Spirometry with Graph  General Counseling: I have discussed the findings of the evaluation and examination with [name].  I have also discussed any further diagnostic evaluation thatmay be needed or ordered today. Halia verbalizes understanding of the findings of todays visit. We also reviewed her medications today and discussed drug interactions and side effects including but not limited excessive drowsiness and altered mental states. We also discussed that there is always a risk not just to her but also people around her. she has been encouraged to call the office with any questions or concerns that should arise related to todays visit.  Orders Placed This Encounter  Procedures  . Spirometry with Graph    Order Specific Question:   Where should this test be performed?    Answer:   [facility]     Time spent: 25 This patient was seen by Orson Gear AGNP-C in Collaboration with Dr. Devona Konig as a part of collaborative care agreement.   I have personally obtained a history, examined the patient, evaluated laboratory and imaging results, formulated the assessment and plan and placed orders.    Allyne Gee, MD [facility]  [facility] Sleep medicine

## 2020-02-21 NOTE — Patient Instructions (Signed)
Medication Instructions:  Your physician recommends that you continue on your current medications as directed. Please refer to the Current Medication list given to you today.  *If you need a refill on your cardiac medications before your next appointment, please call your pharmacy*   Lab Work: Please have your labs drawn at the medical mall on [place] 02/23/20 (bmet, cbc).  Please have your COVID test at the [facility] medical arts building on Thurs 02/23/20 between 12:30-2:30pm. please If you have labs (blood work) drawn today and your tests are completely normal, you will receive your results only by: Marland Kitchen MyChart Message (if you have MyChart) OR . A paper copy in the mail If you have any lab test that is abnormal or we need to change your treatment, we will call you to review the results.   Testing/Procedures: Your physician has requested that you have a cardiac catheterization. Cardiac catheterization is used to diagnose and/or treat various heart conditions. Doctors may recommend this procedure for a number of different reasons. The most common reason is to evaluate chest pain. Chest pain can be a symptom of coronary artery disease (CAD), and cardiac catheterization can show whether plaque is narrowing or blocking your heart's arteries. This procedure is also used to evaluate the valves, as well as measure the blood flow and oxygen levels in different parts of your heart. For further information please visit [email or web address]. Please follow instruction sheet, as given.     Follow-Up: At [facility], you and your health needs are our priority.  As part of our continuing mission to provide you with exceptional heart care, we have created designated Provider Care Teams.  These Care Teams include your primary Cardiologist (physician) and Advanced Practice Providers (APPs -  Physician Assistants and Nurse Practitioners) who all work together to provide you with the care you need, when you need  it.  We recommend signing up for the patient portal called "MyChart".  Sign up information is provided on this After Visit Summary.  MyChart is used to connect with patients for Virtual Visits (Telemedicine).  Patients are able to view lab/test results, encounter notes, upcoming appointments, etc.  Non-urgent messages can be sent to your provider as well.   To learn more about what you can do with MyChart, go to [email or web address].    Your next appointment:   4 week(s)  The format for your next appointment:   In Person  Provider:    You may see Kathlyn Sacramento, MD or one of the following Advanced Practice Providers on your designated Care Team:    Murray Hodgkins, NP  Christell Faith, PA-C  Marrianne Mood, PA-C    Other Instructions [facility] Cardiac Cath Instructions   You are scheduled for a Cardiac Cath on:__3/8/21_______________________  Please arrive at _______am on the day of your procedure  Please expect a call from our [place] to pre-register you  Do not eat/drink anything after midnight  Someone will need to drive you home  It is recommended someone be with you for the first 24 hours after your procedure  Wear clothes that are easy to get on/off and wear slip on shoes if possible   Medications bring a current list of all medications with you  ___  _X__ Do not take these medications before your procedure:____________________________________________________________HOLD Eliquis 2 days priot to your procedure starting Saturday 3/6/21___________________________________________________________________________________________________________________________________________________   Day of your procedure: Arrive at the [place] entrance.  Free valet service is available.  After entering the  Medical Mall please check-in at the registration desk (1st desk on your right) to receive your armband. After receiving your armband someone will escort  you to the cardiac cath/special procedures waiting area.  The usual length of stay after your procedure is about 2 to 3 hours.  This can vary.  If you have any questions, please call our office at [phone], or you may call the cardiac cath lab at [facility] directly at [phone]

## 2020-02-21 NOTE — H&P (View-Only) (Signed)
Cardiology Office Note   Date:  02/21/2020   ID:  Laura [name], Laura [name] 1951-02-16, MRN [id number]  PCP:  [name], MD  Cardiologist:   Kathlyn Sacramento, MD   Chief Complaint  Patient presents with  . Other    discuss R& L heart cath. Patient c/o increased SOB and increased tingling in legs and feet. Meds reviewed verbally with patient.       History of Present Illness: Laura [name] is a 69 y.o. female who is here today for a follow-up visit regarding chronic atrial fibrillation and worsening dyspnea and chest pain. The patient has chronic medical conditions that include obesity, sleep apnea on CPAP, asthma and essential hypertension. She was diagnosed with atrial fibrillation in October 2017. Previous cardiac work-up was done in 2017 included a pharmacologic nuclear stress test which showed no evidence of ischemia with ejection fraction of 53%. She had an echocardiogram done which showed mildly reduced LV systolic function with an EF of 50%, mild LVH, moderately dilated left atrium, moderate mitral and tricuspid regurgitation with mild pulmonary hypertension. She did not tolerate multiple antiarrhythmic medications and has been treated with rate control. She was hospitalized in September with ischemic colitis.  Angiogram showed no acute obstructive disease.  She underwent ileum and right colon resection.  Rate control was continued for atrial fibrillation and ultimately Eliquis was resumed. She was seen recently for worsening exertional dyspnea and palpitations.  I attempted switching her to metoprolol from diltiazem but did not tolerate the medication due to her lung disease.  I added digoxin for better rate control. She underwent an echocardiogram which showed an EF of 40 to 45% with dilated IVC. She follows with Dr. Humphrey Rolls for her lung disease.  She had recent CT scan of the lungs which incidentally found dilated pulmonary artery suggestive of pulmonary hypertension. She returns to  discuss findings.  Her dyspnea has significantly worsened over the last few months and is now happening with minimal exertion.  She had to stop and rest few times just walking from the medical mall to our building.  In addition, she experiences substernal chest tightness with exertion that lasts for few minutes and disappears with resting.  Past Medical History:  Diagnosis Date  . Asthma   . Chronic atrial fibrillation ([place])    a. diagnosed in 09/2016; b. failed flecainide and propafenone due to LE swelling and SOB, could not afford Multaq; c. CHADS2VASc => 3 (HTN, age x 1, female); d. on Eliquis  . GERD (gastroesophageal reflux disease)   . History of stress test    a. Lexiscan Myoview 10/2016: no evidence of ischemia, EF 53%  . Hypertension   . Obesity   . Obstructive sleep apnea   . Pulmonary hypertension ([place])   . Systolic dysfunction    a. TTE 10/2016: EF 50%, mild LVH, moderately dilated LA, moderate MR/TR, mild pulmonary hypertension    Past Surgical History:  Procedure Laterality Date  . BOWEL RESECTION  09/11/2019   Procedure: SMALL BOWEL RESECTION;  Surgeon: Herbert Pun, MD;  Location: [facility] ORS;  Service: General;;  . cataract surgery    . INCISION AND DRAINAGE ABSCESS Right 06/29/2016   Procedure: INCISION AND DRAINAGE ABSCESS;  Surgeon: Florene Glen, MD;  Location: [facility] ORS;  Service: General;  Laterality: Right;  . INCISION AND DRAINAGE OF WOUND Left 06/29/2016   Procedure: IRRIGATION AND DEBRIDEMENT WOUND;  Surgeon: Florene Glen, MD;  Location: [facility] ORS;  Service: General;  Laterality: Left;  . LAPAROSCOPIC RIGHT COLECTOMY  09/11/2019   Procedure: RIGHT COLECTOMY;  Surgeon: Herbert Pun, MD;  Location: [facility] ORS;  Service: General;;  . LAPAROSCOPY N/A 09/11/2019   Procedure: LAPAROSCOPY DIAGNOSTIC;  Surgeon: Herbert Pun, MD;  Location: [facility] ORS;  Service: General;  Laterality: N/A;  . LAPAROTOMY N/A 09/13/2019   Procedure: REOPENING OF RECENT  LAPAROTOMYANASTOMOSIS OF BOWEL;  Surgeon: Herbert Pun, MD;  Location: [facility] ORS;  Service: General;  Laterality: N/A;  . VISCERAL ANGIOGRAPHY N/A 09/12/2019   Procedure: VISCERAL ANGIOGRAPHY;  Surgeon: Algernon Huxley, MD;  Location: [place] [facility];  Service: Cardiovascular;  Laterality: N/A;     Current Outpatient Medications  Medication Sig Dispense Refill  . albuterol (VENTOLIN HFA) 108 (90 Base) MCG/ACT inhaler Inhale 2 puffs into the lungs every 4 (four) hours as needed for wheezing or shortness of breath. 18 g 3  . ALPRAZolam (XANAX) 0.25 MG tablet Take 1 tablet (0.25 mg total) by mouth 2 (two) times daily. 45 tablet 3  . apixaban (ELIQUIS) 5 MG TABS tablet Take 1 tablet (5 mg total) by mouth 2 (two) times daily. 60 tablet 8  . budesonide (PULMICORT) 0.25 MG/2ML nebulizer solution Take 2 mLs (0.25 mg total) by nebulization 2 (two) times daily. 60 mL 12  . cholestyramine (QUESTRAN) 4 g packet Take 1 packet (4 g total) by mouth 2 (two) times daily. 60 packet 5  . digoxin (LANOXIN) 0.25 MG tablet Take 0.5 tablet (0.125 mg) by mouth once daily 45 tablet 2  . diltiazem (CARDIZEM CD) 360 MG 24 hr capsule Take 1 capsule (360 mg total) by mouth daily. 30 capsule 5  . EPINEPHrine 0.3 mg/0.3 mL IJ SOAJ injection Inject 0.3 mg into the muscle as needed for anaphylaxis.     Marland Kitchen ipratropium-albuterol (DUONEB) 0.5-2.5 (3) MG/3ML SOLN Take 3 mLs by nebulization every 4 (four) hours as needed. (Patient taking differently: Take 3 mLs by nebulization every 4 (four) hours as needed (shortness of breath / wheezing). ) 120 mL 3  . levalbuterol (XOPENEX) 0.31 MG/3ML nebulizer solution Take 3 mLs by nebulization every 4 (four) hours as needed. For wheezing/ and or shortness of breath.    . losartan (COZAAR) 25 MG tablet Take 1 tablet (25 mg total) by mouth daily. 90 tablet 2  . metaxalone (SKELAXIN) 800 MG tablet metaxalone 800 mg tablet  Take 1 tablet every 8 hours by oral route.    . montelukast  (SINGULAIR) 10 MG tablet TAKE 1 TABLET BY MOUTH EVERY DAY (Patient taking differently: Take 10 mg by mouth at bedtime. ) 90 tablet 3  . Multiple Vitamin (MULTIVITAMIN) tablet Take 1 tablet by mouth daily.    . ondansetron (ZOFRAN) 4 MG tablet Take 1 tablet (4 mg total) by mouth every 8 (eight) hours as needed for nausea or vomiting. 60 tablet 1  . predniSONE (DELTASONE) 10 MG tablet TAKE 1 TABLET (10 MG TOTAL) BY MOUTH DAILY WITH BREAKFAST. 90 tablet 1  . Probiotic Product (PROBIOTIC-10) CAPS Take 1 capsule by mouth daily.     Marland Kitchen spironolactone (ALDACTONE) 25 MG tablet Take 1 tablet (25 mg total) by mouth 2 (two) times daily. 180 tablet 0  . theophylline (THEODUR) 300 MG 12 hr tablet Take 1 tablet (300 mg total) by mouth 2 (two) times daily. 180 tablet 1   Current Facility-Administered Medications  Medication Dose Route Frequency Provider Last Rate Last Admin  . cyanocobalamin ((VITAMIN B-12)) injection 1,000 mcg  1,000 mcg Intramuscular Once [place]osc[place],  Greer Ee, NP        Allergies:   Flecainide, Metoprolol, Propafenone, and Rivaroxaban    Social History:  The patient  reports that she has never smoked. She has never used smokeless tobacco. She reports that she does not drink alcohol or use drugs.   Family History:  The patient's family history includes COPD in her father; Dementia in her mother; Osteoporosis in her mother; Vascular Disease in her mother.    ROS:  Please see the history of present illness.   Otherwise, review of systems are positive for none.   All other systems are reviewed and negative.    PHYSICAL EXAM: VS:  BP (!) 168/80 (BP Location: Left Arm, Patient Position: Sitting, Cuff Size: Large)   Pulse 97   Ht 5\' 4"  (1.626 m)   Wt 267 lb (121.1 kg)   SpO2 98%   BMI 45.83 kg/m  , BMI Body mass index is 45.83 kg/m. GEN: Well nourished, well developed, in no acute distress  HEENT: normal  Neck: no JVD, carotid bruits, or masses Cardiac: Irregularly irregular ; no  murmurs, rubs, or gallops, +1 edema Respiratory:  clear to auscultation bilaterally, normal work of breathing GI: soft, nontender, nondistended, + BS MS: no deformity or atrophy  Skin: warm and dry, no rash Neuro:  Strength and sensation are intact Psych: euthymic mood, full affect Radial pulses normal   EKG:  EKG is ordered today. The ekg ordered today demonstrates atrial fibrillation with ventricular rate of 97 bpm.  Possible old septal infarct   Recent Labs: 09/12/2019: ALT 17 01/02/2020: BUN 13; Creatinine, Ser 0.88; Hemoglobin 10.7; Magnesium 2.1; Platelets 346; Potassium 4.1; Sodium 143; TSH 1.810    Lipid Panel    Component Value Date/Time   CHOL 189 09/10/2018 0741   TRIG 104 09/10/2018 0741   HDL 55 09/10/2018 0741   CHOLHDL 3.4 09/10/2018 0741   LDLCALC 113 (H) 09/10/2018 0741      Wt Readings from Last 3 Encounters:  02/21/20 267 lb (121.1 kg)  02/21/20 265 lb 9.6 oz (120.5 kg)  02/09/20 269 lb (122 kg)      PAD Screen 04/03/2017  Previous PAD dx? No  Previous surgical procedure? No  Pain with walking? No  Feet/toe relief with dangling? No  Painful, non-healing ulcers? No  Extremities discolored? No      ASSESSMENT AND PLAN:  1.  Exertional chest pain suggestive of class III angina: This is new overall over the last few months and echocardiogram showed a decrease in ejection fraction to 40 to 45%.  We have to exclude underlying ischemic cardiomyopathy.  In addition, she has dyspnea with minimal exertion and CTA showed dilated pulmonary arteries suggestive of significant pulmonary hypertension.  Due to that, I recommend proceeding with a right and left cardiac catheterization and possible PCI.  I discussed the procedure in details as well as risks and benefits.  Hold Eliquis 2 days before the procedure.  2.  Chronic atrial fibrillation: Ventricular rate improved with the addition of digoxin.  She did not tolerate metoprolol.  Continue diltiazem.   3. Sleep  apnea: Continue treatment with CPAP.  4. Essential hypertension: Blood pressure is elevated.  We will consider increasing losartan.  5.  Chronic systolic heart failure: We will evaluate volume status with cardiac catheterization.  We will consider adding loop diuretic.  Continue treatment with losartan and spironolactone.  She is currently not on a beta-blocker as she did not tolerate due to her lung  disease.    Disposition:   FU with me in  1 month. Signed,  Kathlyn Sacramento, MD  02/21/2020 4:42 PM    [place]

## 2020-02-21 NOTE — Progress Notes (Signed)
Cardiology Office Note   Date:  02/21/2020   ID:  [name], [name] [birth year], MRN [id number]  PCP:  [name], MD  Cardiologist:   Kathlyn Sacramento, MD   Chief Complaint  Patient presents with  . Other    discuss R& L heart cath. Patient c/o increased SOB and increased tingling in legs and feet. Meds reviewed verbally with patient.       History of Present Illness: Laura [name] is a 68 y.o. female who is here today for a follow-up visit regarding chronic atrial fibrillation and worsening dyspnea and chest pain. The patient has chronic medical conditions that include obesity, sleep apnea on CPAP, asthma and essential hypertension. She was diagnosed with atrial fibrillation in October 2017. Previous cardiac work-up was done in 2017 included a pharmacologic nuclear stress test which showed no evidence of ischemia with ejection fraction of 53%. She had an echocardiogram done which showed mildly reduced LV systolic function with an EF of 50%, mild LVH, moderately dilated left atrium, moderate mitral and tricuspid regurgitation with mild pulmonary hypertension. She did not tolerate multiple antiarrhythmic medications and has been treated with rate control. She was hospitalized in September with ischemic colitis.  Angiogram showed no acute obstructive disease.  She underwent ileum and right colon resection.  Rate control was continued for atrial fibrillation and ultimately Eliquis was resumed. She was seen recently for worsening exertional dyspnea and palpitations.  I attempted switching her to metoprolol from diltiazem but did not tolerate the medication due to her lung disease.  I added digoxin for better rate control. She underwent an echocardiogram which showed an EF of 40 to 45% with dilated IVC. She follows with Dr. Humphrey Rolls for her lung disease.  She had recent CT scan of the lungs which incidentally found dilated pulmonary artery suggestive of pulmonary hypertension. She returns to  discuss findings.  Her dyspnea has significantly worsened over the last few months and is now happening with minimal exertion.  She had to stop and rest few times just walking [place] medical mall to our building.  In addition, she experiences substernal chest tightness with exertion that lasts for few minutes and disappears with resting.  Past Medical History:  Diagnosis Date  . Asthma   . Chronic atrial fibrillation ([place])    a. diagnosed in 09/2016; b. failed flecainide and propafenone due to LE swelling and SOB, could not afford Multaq; c. CHADS2VASc => 3 (HTN, age x 1, female); d. on Eliquis  . GERD (gastroesophageal reflux disease)   . History of stress test    a. Lexiscan Myoview 10/2016: no evidence of ischemia, EF 53%  . Hypertension   . Obesity   . Obstructive sleep apnea   . Pulmonary hypertension ([place])   . Systolic dysfunction    a. TTE 10/2016: EF 50%, mild LVH, moderately dilated LA, moderate MR/TR, mild pulmonary hypertension    Past Surgical History:  Procedure Laterality Date  . BOWEL RESECTION  09/11/2019   Procedure: SMALL BOWEL RESECTION;  Surgeon: Herbert Pun, MD;  Location: [facility];  Service: General;;  . cataract surgery    . INCISION AND DRAINAGE ABSCESS Right 06/29/2016   Procedure: INCISION AND DRAINAGE ABSCESS;  Surgeon: Florene Glen, MD;  Location: [facility];  Service: General;  Laterality: Right;  . INCISION AND DRAINAGE OF WOUND Left 06/29/2016   Procedure: IRRIGATION AND DEBRIDEMENT WOUND;  Surgeon: Florene Glen, MD;  Location: [facility];  Service: General;  Laterality: Left;  . LAPAROSCOPIC RIGHT COLECTOMY  09/11/2019   Procedure: RIGHT COLECTOMY;  Surgeon: Herbert Pun, MD;  Location: [facility];  Service: General;;  . LAPAROSCOPY N/A 09/11/2019   Procedure: LAPAROSCOPY DIAGNOSTIC;  Surgeon: Herbert Pun, MD;  Location: [facility];  Service: General;  Laterality: N/A;  . LAPAROTOMY N/A 09/13/2019   Procedure: REOPENING OF RECENT  LAPAROTOMYANASTOMOSIS OF BOWEL;  Surgeon: Herbert Pun, MD;  Location: [facility];  Service: General;  Laterality: N/A;  . VISCERAL ANGIOGRAPHY N/A 09/12/2019   Procedure: VISCERAL ANGIOGRAPHY;  Surgeon: Algernon Huxley, MD;  Location: [facility];  Service: Cardiovascular;  Laterality: N/A;     Current Outpatient Medications  Medication Sig Dispense Refill  . albuterol (VENTOLIN HFA) 108 (90 Base) MCG/ACT inhaler Inhale 2 puffs into the lungs every 4 (four) hours as needed for wheezing or shortness of breath. 18 g 3  . ALPRAZolam (XANAX) 0.25 MG tablet Take 1 tablet (0.25 mg total) by mouth 2 (two) times daily. 45 tablet 3  . apixaban (ELIQUIS) 5 MG TABS tablet Take 1 tablet (5 mg total) by mouth 2 (two) times daily. 60 tablet 8  . budesonide (PULMICORT) 0.25 MG/2ML nebulizer solution Take 2 mLs (0.25 mg total) by nebulization 2 (two) times daily. 60 mL 12  . cholestyramine (QUESTRAN) 4 g packet Take 1 packet (4 g total) by mouth 2 (two) times daily. 60 packet 5  . digoxin (LANOXIN) 0.25 MG tablet Take 0.5 tablet (0.125 mg) by mouth once daily 45 tablet 2  . diltiazem (CARDIZEM CD) 360 MG 24 hr capsule Take 1 capsule (360 mg total) by mouth daily. 30 capsule 5  . EPINEPHrine 0.3 mg/0.3 mL IJ SOAJ injection Inject 0.3 mg into the muscle as needed for anaphylaxis.     Marland Kitchen ipratropium-albuterol (DUONEB) 0.5-2.5 (3) MG/3ML SOLN Take 3 mLs by nebulization every 4 (four) hours as needed. (Patient taking differently: Take 3 mLs by nebulization every 4 (four) hours as needed (shortness of breath / wheezing). ) 120 mL 3  . levalbuterol (XOPENEX) 0.31 MG/3ML nebulizer solution Take 3 mLs by nebulization every 4 (four) hours as needed. For wheezing/ and or shortness of breath.    . losartan (COZAAR) 25 MG tablet Take 1 tablet (25 mg total) by mouth daily. 90 tablet 2  . metaxalone (SKELAXIN) 800 MG tablet metaxalone 800 mg tablet  Take 1 tablet every 8 hours by oral route.    . montelukast  (SINGULAIR) 10 MG tablet TAKE 1 TABLET BY MOUTH EVERY DAY (Patient taking differently: Take 10 mg by mouth at bedtime. ) 90 tablet 3  . Multiple Vitamin (MULTIVITAMIN) tablet Take 1 tablet by mouth daily.    . ondansetron (ZOFRAN) 4 MG tablet Take 1 tablet (4 mg total) by mouth every 8 (eight) hours as needed for nausea or vomiting. 60 tablet 1  . predniSONE (DELTASONE) 10 MG tablet TAKE 1 TABLET (10 MG TOTAL) BY MOUTH DAILY WITH BREAKFAST. 90 tablet 1  . Probiotic Product (PROBIOTIC-10) CAPS Take 1 capsule by mouth daily.     Marland Kitchen spironolactone (ALDACTONE) 25 MG tablet Take 1 tablet (25 mg total) by mouth 2 (two) times daily. 180 tablet 0  . theophylline (THEODUR) 300 MG 12 hr tablet Take 1 tablet (300 mg total) by mouth 2 (two) times daily. 180 tablet 1   Current Facility-Administered Medications  Medication Dose Route Frequency Provider Last Rate Last Admin  . cyanocobalamin ((VITAMIN B-12)) injection 1,000 mcg  1,000 mcg Intramuscular Once Boscia,  Greer Ee, NP        Allergies:   Flecainide, Metoprolol, Propafenone, and Rivaroxaban    Social History:  The patient  reports that she has never smoked. She has never used smokeless tobacco. She reports that she does not drink alcohol or use drugs.   Family History:  The patient's family history includes COPD in her father; Dementia in her mother; Osteoporosis in her mother; Vascular Disease in her mother.    ROS:  Please see the history of present illness.   Otherwise, review of systems are positive for none.   All other systems are reviewed and negative.    PHYSICAL EXAM: VS:  BP (!) 168/80 (BP Location: Left Arm, Patient Position: Sitting, Cuff Size: Large)   Pulse 97   Ht 5\' 4"  (1.626 m)   Wt 267 lb (121.1 kg)   SpO2 98%   BMI 45.83 kg/m  , BMI Body mass index is 45.83 kg/m. GEN: Well nourished, well developed, in no acute distress  HEENT: normal  Neck: no JVD, carotid bruits, or masses Cardiac: Irregularly irregular ; no  murmurs, rubs, or gallops, +1 edema Respiratory:  clear to auscultation bilaterally, normal work of breathing GI: soft, nontender, nondistended, + BS MS: no deformity or atrophy  Skin: warm and dry, no rash Neuro:  Strength and sensation are intact Psych: euthymic mood, full affect Radial pulses normal   EKG:  EKG is ordered today. The ekg ordered today demonstrates atrial fibrillation with ventricular rate of 97 bpm.  Possible old septal infarct   Recent Labs: 09/12/2019: ALT 17 01/02/2020: BUN 13; Creatinine, Ser 0.88; Hemoglobin 10.7; Magnesium 2.1; Platelets 346; Potassium 4.1; Sodium 143; TSH 1.810    Lipid Panel    Component Value Date/Time   CHOL 189 09/10/2018 0741   TRIG 104 09/10/2018 0741   HDL 55 09/10/2018 0741   CHOLHDL 3.4 09/10/2018 0741   LDLCALC 113 (H) 09/10/2018 0741      Wt Readings from Last 3 Encounters:  02/21/20 267 lb (121.1 kg)  02/21/20 265 lb 9.6 oz (120.5 kg)  02/09/20 269 lb (122 kg)      PAD Screen 04/03/2017  Previous PAD dx? No  Previous surgical procedure? No  Pain with walking? No  Feet/toe relief with dangling? No  Painful, non-healing ulcers? No  Extremities discolored? No      ASSESSMENT AND PLAN:  1.  Exertional chest pain suggestive of class III angina: This is new overall over the last few months and echocardiogram showed a decrease in ejection fraction to 40 to 45%.  We have to exclude underlying ischemic cardiomyopathy.  In addition, she has dyspnea with minimal exertion and CTA showed dilated pulmonary arteries suggestive of significant pulmonary hypertension.  Due to that, I recommend proceeding with a right and left cardiac catheterization and possible PCI.  I discussed the procedure in details as well as risks and benefits.  Hold Eliquis 2 days before the procedure.  2.  Chronic atrial fibrillation: Ventricular rate improved with the addition of digoxin.  She did not tolerate metoprolol.  Continue diltiazem.   3. Sleep  apnea: Continue treatment with CPAP.  4. Essential hypertension: Blood pressure is elevated.  We will consider increasing losartan.  5.  Chronic systolic heart failure: We will evaluate volume status with cardiac catheterization.  We will consider adding loop diuretic.  Continue treatment with losartan and spironolactone.  She is currently not on a beta-blocker as she did not tolerate due to her lung  disease.    Disposition:   FU with me in  1 month. Signed,  Kathlyn Sacramento, MD  02/21/2020 4:42 PM    [place]

## 2020-02-22 ENCOUNTER — Telehealth: Payer: Self-pay

## 2020-02-22 NOTE — Telephone Encounter (Signed)
Contacted the patient to confirm the time of her scheduled 02/27/20 cardiac cath. Patient adv that the procedure is scheduled for 10:30am at [facility]. She will need to arrive at the medical mall by 9:30am. Patient was given written pre-procedure instructions at her 02/21/20 o/v. She will have her COVID test on 02/23/20.   Patient verbalized understanding to the info given and has no questions or concerns at this time.

## 2020-02-23 ENCOUNTER — Other Ambulatory Visit
Admission: RE | Admit: 2020-02-23 | Discharge: 2020-02-23 | Disposition: A | Discharge status: Home / Self Care | Payer: BC Managed Care – PPO | Source: Ambulatory Visit | Attending: Cardiovascular Disease | Admitting: Cardiovascular Disease

## 2020-02-23 DIAGNOSIS — Z01812 Encounter for preprocedural laboratory examination: Secondary | ICD-10-CM | POA: Insufficient documentation

## 2020-02-23 DIAGNOSIS — R197 Diarrhea, unspecified: Secondary | ICD-10-CM | POA: Diagnosis not present

## 2020-02-23 DIAGNOSIS — Z20822 Contact with and (suspected) exposure to covid-19: Secondary | ICD-10-CM | POA: Insufficient documentation

## 2020-02-23 NOTE — Telephone Encounter (Signed)
Dr. Fletcher Anon, Laura [name], patient initially scheduled for colonoscopy for evaluation of diarrhea and rule out colitis. She was treated with azithromycin in Feburary for enteroaggregative E coli after positive PCR of stool. Colonoscopy is to rule out microscopic colitis. Since the patient will undergo cardiac catheterization on 02/27/2019 for angina. Will hold off on clearance and wait for the cath result.

## 2020-02-23 NOTE — Telephone Encounter (Signed)
PLEASE CALL TO UPDATE STATUS OF CLEARANCE.

## 2020-02-24 LAB — SARS CORONAVIRUS 2 (TAT 6-24 HRS): SARS Coronavirus 2: NEGATIVE

## 2020-02-24 NOTE — Telephone Encounter (Signed)
Ok thanks 

## 2020-02-27 ENCOUNTER — Encounter
Admission: RE | Disposition: A | Discharge status: Home / Self Care | Payer: Self-pay | Source: Home / Self Care | Attending: Cardiovascular Disease

## 2020-02-27 ENCOUNTER — Other Ambulatory Visit: Payer: Self-pay

## 2020-02-27 ENCOUNTER — Encounter: Payer: Self-pay | Admitting: Cardiovascular Disease

## 2020-02-27 ENCOUNTER — Ambulatory Visit
Admission: RE | Admit: 2020-02-27 | Discharge: 2020-02-27 | Disposition: A | Discharge status: Home / Self Care | Payer: BC Managed Care – PPO | Attending: Cardiovascular Disease | Admitting: Cardiovascular Disease

## 2020-02-27 ENCOUNTER — Telehealth: Payer: Self-pay

## 2020-02-27 DIAGNOSIS — I482 Chronic atrial fibrillation, unspecified: Secondary | ICD-10-CM | POA: Diagnosis not present

## 2020-02-27 DIAGNOSIS — R079 Chest pain, unspecified: Secondary | ICD-10-CM

## 2020-02-27 DIAGNOSIS — Z9049 Acquired absence of other specified parts of digestive tract: Secondary | ICD-10-CM | POA: Diagnosis not present

## 2020-02-27 DIAGNOSIS — J45909 Unspecified asthma, uncomplicated: Secondary | ICD-10-CM | POA: Diagnosis not present

## 2020-02-27 DIAGNOSIS — Z7951 Long term (current) use of inhaled steroids: Secondary | ICD-10-CM | POA: Insufficient documentation

## 2020-02-27 DIAGNOSIS — Z6841 Body Mass Index (BMI) 40.0 and over, adult: Secondary | ICD-10-CM | POA: Insufficient documentation

## 2020-02-27 DIAGNOSIS — G4733 Obstructive sleep apnea (adult) (pediatric): Secondary | ICD-10-CM | POA: Insufficient documentation

## 2020-02-27 DIAGNOSIS — Z825 Family history of asthma and other chronic lower respiratory diseases: Secondary | ICD-10-CM | POA: Insufficient documentation

## 2020-02-27 DIAGNOSIS — I272 Pulmonary hypertension, unspecified: Secondary | ICD-10-CM

## 2020-02-27 DIAGNOSIS — Z79899 Other long term (current) drug therapy: Secondary | ICD-10-CM | POA: Insufficient documentation

## 2020-02-27 DIAGNOSIS — I2510 Atherosclerotic heart disease of native coronary artery without angina pectoris: Secondary | ICD-10-CM

## 2020-02-27 DIAGNOSIS — I5022 Chronic systolic (congestive) heart failure: Secondary | ICD-10-CM | POA: Diagnosis not present

## 2020-02-27 DIAGNOSIS — I110 Hypertensive heart disease with heart failure: Secondary | ICD-10-CM | POA: Diagnosis not present

## 2020-02-27 DIAGNOSIS — Z7901 Long term (current) use of anticoagulants: Secondary | ICD-10-CM | POA: Insufficient documentation

## 2020-02-27 DIAGNOSIS — K219 Gastro-esophageal reflux disease without esophagitis: Secondary | ICD-10-CM | POA: Insufficient documentation

## 2020-02-27 DIAGNOSIS — E669 Obesity, unspecified: Secondary | ICD-10-CM | POA: Diagnosis not present

## 2020-02-27 DIAGNOSIS — Z8249 Family history of ischemic heart disease and other diseases of the circulatory system: Secondary | ICD-10-CM | POA: Insufficient documentation

## 2020-02-27 HISTORY — PX: RIGHT/LEFT HEART CATH AND CORONARY ANGIOGRAPHY: CATH118266

## 2020-02-27 LAB — GI PROFILE, STOOL, PCR

## 2020-02-27 SURGERY — RIGHT/LEFT HEART CATH AND CORONARY ANGIOGRAPHY
Anesthesia: Moderate Sedation | Laterality: Bilateral

## 2020-02-27 MED ORDER — FUROSEMIDE 20 MG PO TABS: 20.0000 mg | ORAL_TABLET | Freq: Every day | ORAL | 4 refills | Status: DC

## 2020-02-27 MED ORDER — ACETAMINOPHEN 325 MG PO TABS: 650.0000 mg | ORAL_TABLET | ORAL | Status: DC | PRN

## 2020-02-27 MED ORDER — SODIUM CHLORIDE 0.9% FLUSH: 3.0000 mL | INTRAVENOUS | Status: DC | PRN

## 2020-02-27 MED ORDER — HEPARIN (PORCINE) IN NACL 1000-0.9 UT/500ML-% IV SOLN
INTRAVENOUS | Status: AC
  Filled 2020-02-27: qty 1000

## 2020-02-27 MED ORDER — SODIUM CHLORIDE 0.9 % IV SOLN: 250.0000 mL | INTRAVENOUS | Status: DC | PRN

## 2020-02-27 MED ORDER — MIDAZOLAM HCL 2 MG/2ML IJ SOLN
INTRAMUSCULAR | Status: AC
  Filled 2020-02-27: qty 2

## 2020-02-27 MED ORDER — SODIUM CHLORIDE 0.9% FLUSH: 3.0000 mL | Freq: Two times a day (BID) | INTRAVENOUS | Status: DC

## 2020-02-27 MED ORDER — FENTANYL CITRATE (PF) 100 MCG/2ML IJ SOLN
INTRAMUSCULAR | Status: DC | PRN
  Administered 2020-02-27: 25 ug via INTRAVENOUS

## 2020-02-27 MED ORDER — ONDANSETRON HCL 4 MG/2ML IJ SOLN: 4.0000 mg | Freq: Four times a day (QID) | INTRAMUSCULAR | Status: DC | PRN

## 2020-02-27 MED ORDER — HEPARIN SODIUM (PORCINE) 1000 UNIT/ML IJ SOLN
INTRAMUSCULAR | Status: DC | PRN
  Administered 2020-02-27: 5000 [IU] via INTRAVENOUS

## 2020-02-27 MED ORDER — ASPIRIN 81 MG PO CHEW
81.0000 mg | CHEWABLE_TABLET | ORAL | Status: AC
  Administered 2020-02-27: 81 mg via ORAL

## 2020-02-27 MED ORDER — VERAPAMIL HCL 2.5 MG/ML IV SOLN
INTRAVENOUS | Status: DC | PRN
  Administered 2020-02-27: 2.5 mg via INTRA_ARTERIAL

## 2020-02-27 MED ORDER — ASPIRIN 81 MG PO CHEW
CHEWABLE_TABLET | ORAL | Status: AC
  Filled 2020-02-27: qty 1

## 2020-02-27 MED ORDER — FENTANYL CITRATE (PF) 100 MCG/2ML IJ SOLN
INTRAMUSCULAR | Status: AC
  Filled 2020-02-27: qty 2

## 2020-02-27 MED ORDER — HEPARIN SODIUM (PORCINE) 1000 UNIT/ML IJ SOLN
INTRAMUSCULAR | Status: AC
  Filled 2020-02-27: qty 1

## 2020-02-27 MED ORDER — MIDAZOLAM HCL 2 MG/2ML IJ SOLN
INTRAMUSCULAR | Status: DC | PRN
  Administered 2020-02-27: 1 mg via INTRAVENOUS

## 2020-02-27 MED ORDER — SODIUM CHLORIDE 0.9 % IV SOLN: INTRAVENOUS | Status: DC

## 2020-02-27 MED ORDER — IOHEXOL 300 MG/ML  SOLN
INTRAMUSCULAR | Status: DC | PRN
  Administered 2020-02-27: 13:00:00 55 mL

## 2020-02-27 MED ORDER — VERAPAMIL HCL 2.5 MG/ML IV SOLN
INTRAVENOUS | Status: AC
  Filled 2020-02-27: qty 2

## 2020-02-27 MED ORDER — HEPARIN (PORCINE) IN NACL 1000-0.9 UT/500ML-% IV SOLN
INTRAVENOUS | Status: DC | PRN
  Administered 2020-02-27: 500 mL

## 2020-02-27 SURGICAL SUPPLY — 10 items
CATH BALLN WEDGE 5F 110CM (CATHETERS) ×2 IMPLANT
CATH INFINITI 5FR JK (CATHETERS) ×2 IMPLANT
CATH INFINITI JR4 5F (CATHETERS) ×2 IMPLANT
DEVICE RAD TR BAND REGULAR (VASCULAR PRODUCTS) ×2 IMPLANT
GLIDESHEATH SLEND SS 6F .021 (SHEATH) ×2 IMPLANT
KIT MANI 3VAL PERCEP (MISCELLANEOUS) ×2 IMPLANT
KIT RIGHT HEART (MISCELLANEOUS) ×2 IMPLANT
PACK CARDIAC CATH (CUSTOM PROCEDURE TRAY) ×2 IMPLANT
SHEATH GLIDE SLENDER 4/5FR (SHEATH) ×2 IMPLANT
WIRE ROSEN-J .035X260CM (WIRE) ×2 IMPLANT

## 2020-02-27 NOTE — Interval H&P Note (Signed)
Cath Lab Visit (complete for each Cath Lab visit)  Clinical Evaluation Leading to the Procedure:   ACS: No.  Non-ACS:    Anginal Classification: CCS III  Anti-ischemic medical therapy: Minimal Therapy (1 class of medications)  Non-Invasive Test Results: No non-invasive testing performed  Prior CABG: No previous CABG      History and Physical Interval Note:  02/27/2020 12:33 PM  Laura [name]  has presented today for surgery, with the diagnosis of RT and LT Cath   Chest pain and pulmonary hypertension.  The various methods of treatment have been discussed with the patient and family. After consideration of risks, benefits and other options for treatment, the patient has consented to  Procedure(s): RIGHT/LEFT HEART CATH AND CORONARY ANGIOGRAPHY (Bilateral) as a surgical intervention.  The patient's history has been reviewed, patient examined, no change in status, stable for surgery.  I have reviewed the patient's chart and labs.  Questions were answered to the patient's satisfaction.     Kathlyn Sacramento

## 2020-02-27 NOTE — Telephone Encounter (Signed)
Left message for Laura [name], CMA pt is scheduled for heart cath today with Dr. Fletcher Anon. We will await to hear from Dr. Fletcher Anon if pt will be cleared for her procedure with [facility].

## 2020-02-27 NOTE — Telephone Encounter (Signed)
-----   Message from Wellington Hampshire, MD sent at 02/27/2020  1:16 PM EST ----- I started her on small dose furosemide today after cath.  Please arrange for basic metabolic profile and digoxin level to be done in 1 week.

## 2020-02-27 NOTE — Progress Notes (Signed)
Dr. Fletcher Anon at bedside, speaking with pt. And her daughter [name] re: cath results. Both verbalize understanding of conversation.

## 2020-02-27 NOTE — Telephone Encounter (Signed)
Spoke with the patient. Patient made aware that Dr. Fletcher Anon will like for her to have lab work (bmet, dig) in 1 week. Patient will have labs drawn at the medical mall. Lab orders are in Epic.

## 2020-02-27 NOTE — Telephone Encounter (Signed)
Pt had cath today.

## 2020-02-27 NOTE — Discharge Instructions (Signed)
Resume Eliquis tomorrow as long as no bleeding issues from the right arm. A new prescription for furosemide 20 mg daily was sent to your pharmacy.  Our office will contact you for blood work in 1 week.

## 2020-02-28 ENCOUNTER — Ambulatory Visit: Payer: BC Managed Care – PPO

## 2020-02-28 ENCOUNTER — Telehealth: Payer: Self-pay | Admitting: Cardiovascular Disease

## 2020-02-28 ENCOUNTER — Encounter: Payer: Self-pay | Admitting: Gastroenterology

## 2020-02-28 NOTE — Telephone Encounter (Signed)
I called and spoke with the patient regarding clearance. She had L&R heart cath done yesterday that showed only mild obstructive CAD but she did have elevated filling pressures. Her lasix had been stopped due to constant diarrhea. Lasix has now been resumed at 20 mg daily.  I will call her on Thursday to see how she is doing and if she is tolerating the lasix breathing is OK, will clear her to have procedure. She very much wants to have the procedure in March.

## 2020-02-28 NOTE — Telephone Encounter (Signed)
Yes she can proceed.  Low risk.

## 2020-02-28 NOTE — Telephone Encounter (Signed)
Spoke with the patient. Patient sts that her employer has suggested that she stay out of work as long as needed since her job responsibilities does require frequent lifting.  Adv the patient that Dr. Fletcher Anon recommends that she remain out of work until 03/05/20 at which time she can return with no restrictions.  Letter faxed to the pt employer, fax # provided by the patient [phone] attn: Temple Pacini.

## 2020-02-28 NOTE — Telephone Encounter (Signed)
Patient is scheduled for a colonoscopy on 03/06/20.  The patient and her GI physician is inquiring if it is ok for her to proceed from a cardiac standpoint.  Advised the patient that I will fwd the message to Dr. Fletcher Anon and call back with his response/recommendation. Patient verbalized understanding.

## 2020-02-28 NOTE — Telephone Encounter (Signed)
Patient had a heart cath yesterday Patient is scheduled to go back to work tomorrow 3/10 and lifts about 20 pounds around 15 times a day Would like to know if it would ok to start back Please call to discuss

## 2020-02-28 NOTE — Telephone Encounter (Signed)
Routed to Dr.Arida to advise. °

## 2020-02-29 ENCOUNTER — Telehealth: Payer: Self-pay

## 2020-02-29 NOTE — Telephone Encounter (Signed)
-----   Message from [name], MD sent at 02/28/2020 12:14 PM EST ----- Inform stool tests show no infection.  That means infection has been cleared.  Inquire if she still having diarrhea.  If she is then set up for a colonoscopy and a follow-up appointment afterwards

## 2020-02-29 NOTE — Telephone Encounter (Signed)
Patient verbalized understanding that ok to proceed with colonoscopy at low risk.

## 2020-02-29 NOTE — Telephone Encounter (Signed)
Spoke with pt and informed her of lab result and Dr. Georgeann Oppenheim recommendation. Pt states she's still experiencing diarrhea. Pt agrees with proceeding with the colonoscopy as scheduled. Pt states she spoke with her cardiologist today and was told that she's cleared to have the colonoscopy and was given directions to hold the Eliquis 2 days prior to the procedure.

## 2020-03-01 NOTE — Telephone Encounter (Signed)
I called pt today to check on her status and ability to proceed with colonoscopy. She says that she had a bad day yesterday with shortness of breath, nausea and feeling sleepy. She is feeling better today. Her breathing is better and her edema is much better. She postponed her colonoscopy until later this month. I offered to have her seen sooner in our office than the scheduled appt on 3/30 but she declines. She says that she will call back on Monday to update [place] on her status.  She really wants to have the colonoscopy this month as her insurance starts over on 4/1 and she would have to meet her deductible again.

## 2020-03-02 ENCOUNTER — Telehealth: Payer: Self-pay

## 2020-03-02 ENCOUNTER — Ambulatory Visit: Payer: BC Managed Care – PPO | Admitting: Physician Assistant

## 2020-03-02 ENCOUNTER — Other Ambulatory Visit: Admission: RE | Admit: 2020-03-02 | Payer: BC Managed Care – PPO | Source: Ambulatory Visit

## 2020-03-02 ENCOUNTER — Ambulatory Visit (INDEPENDENT_AMBULATORY_CARE_PROVIDER_SITE_OTHER): Payer: BC Managed Care – PPO

## 2020-03-02 ENCOUNTER — Other Ambulatory Visit: Payer: Self-pay

## 2020-03-02 ENCOUNTER — Other Ambulatory Visit
Admission: RE | Admit: 2020-03-02 | Discharge: 2020-03-02 | Disposition: A | Discharge status: Home / Self Care | Payer: BC Managed Care – PPO | Source: Ambulatory Visit | Attending: Cardiovascular Disease | Admitting: Cardiovascular Disease

## 2020-03-02 DIAGNOSIS — E538 Deficiency of other specified B group vitamins: Secondary | ICD-10-CM | POA: Diagnosis not present

## 2020-03-02 DIAGNOSIS — I5022 Chronic systolic (congestive) heart failure: Secondary | ICD-10-CM | POA: Diagnosis not present

## 2020-03-02 DIAGNOSIS — I482 Chronic atrial fibrillation, unspecified: Secondary | ICD-10-CM | POA: Diagnosis not present

## 2020-03-02 LAB — BASIC METABOLIC PANEL
Anion gap: 8 (ref 5–15)
BUN: 27 mg/dL — ABNORMAL HIGH (ref 8–23)
CO2: 25 mmol/L (ref 22–32)
Calcium: 9.5 mg/dL (ref 8.9–10.3)
Chloride: 102 mmol/L (ref 98–111)
Creatinine, Ser: 0.87 mg/dL (ref 0.44–1.00)
GFR calc Af Amer: >60 mL/min (ref >60)
GFR calc non Af Amer: >60 mL/min (ref >60)
Glucose, Bld: 167 mg/dL — ABNORMAL HIGH (ref 70–99)
Potassium: 4.8 mmol/L (ref 3.5–5.1)
Sodium: 135 mmol/L (ref 135–145)

## 2020-03-02 LAB — DIGOXIN LEVEL: Digoxin Level: 0.3 ng/mL — ABNORMAL LOW (ref 0.8–2.0)

## 2020-03-02 MED ORDER — CYANOCOBALAMIN 1000 MCG/ML IJ SOLN
1000.0000 ug | Freq: Once | INTRAMUSCULAR | Status: AC
  Administered 2020-03-02: 1000 ug via INTRAMUSCULAR

## 2020-03-02 NOTE — Telephone Encounter (Signed)
Pt called requesting to cancel her colonoscopy due to having abnormal symptoms occur after her recent cardiac procedure. Pt plans to follow up with her cardiologist and will contact our office to reschedule her procedure once her cardiologist deems pt safe to proceed. [facility] has been notified. Pt also wanted to inform Dr. Vicente Males that the diarrhea has subsided.

## 2020-03-05 ENCOUNTER — Telehealth: Payer: Self-pay | Admitting: Cardiovascular Disease

## 2020-03-05 NOTE — Telephone Encounter (Signed)
See result note.  

## 2020-03-05 NOTE — Telephone Encounter (Signed)
Pt called to request medication and Dr. Georgeann Oppenheim advice for recent onset of severe acid reflux and diarrhea that has returned. Pt is concerned that her symptoms may be due to her cardiac medications. Pt also wants to discuss rescheduling her colonoscopy before April. I explained to pt that it would be best to discuss this with Dr. Vicente Males directly. Pt agrees and Dr. Vicente Males agrees to pt having a televisit with him tomorrow morning. Pt has been scheduled.

## 2020-03-05 NOTE — Telephone Encounter (Signed)
Lab results are awaiting review and signature by Dr. Fletcher Anon. Msg fwd to MD.

## 2020-03-05 NOTE — Telephone Encounter (Signed)
Patient calling in for lab results from last week. Patient is concerned about digoxin levels  Please advise when able

## 2020-03-06 ENCOUNTER — Other Ambulatory Visit: Payer: Self-pay

## 2020-03-06 ENCOUNTER — Ambulatory Visit
Admission: RE | Admit: 2020-03-06 | Payer: BC Managed Care – PPO | Source: Home / Self Care | Admitting: Gastroenterology

## 2020-03-06 ENCOUNTER — Ambulatory Visit (INDEPENDENT_AMBULATORY_CARE_PROVIDER_SITE_OTHER): Payer: BC Managed Care – PPO | Admitting: Gastroenterology

## 2020-03-06 ENCOUNTER — Encounter: Admission: RE | Payer: Self-pay | Source: Home / Self Care

## 2020-03-06 DIAGNOSIS — K219 Gastro-esophageal reflux disease without esophagitis: Secondary | ICD-10-CM | POA: Diagnosis not present

## 2020-03-06 DIAGNOSIS — R197 Diarrhea, unspecified: Secondary | ICD-10-CM

## 2020-03-06 SURGERY — COLONOSCOPY WITH PROPOFOL
Anesthesia: General

## 2020-03-06 NOTE — Telephone Encounter (Signed)
DPR on file with ok to leave a detailed message. Lmom with results and recommendation. Patient is to contact the office if any questions. Results released to mychart.

## 2020-03-06 NOTE — Telephone Encounter (Signed)
-----   Message from Wellington Hampshire, MD sent at 03/05/2020  4:49 PM EDT ----- Inform patient that labs showed normal renal function overall.  BUN was mildly elevated .she should make sure to drink regular amount of fluid.  Digoxin level was low but that is better than being high.  We can consider increasing the dose of digoxin upon follow-up if heart rate is high.

## 2020-03-06 NOTE — Progress Notes (Signed)
Laura [name] , MD [address]  [place]  [place], [place] [address]  Main: [phone]  Fax: [phone]   Primary Care Physician: Laura Guise, MD  Virtual Visit via Telephone Note  I connected with patient on 03/06/20 at 10:15 AM EDT by telephone and verified that I am speaking with the correct person using two identifiers.   I discussed the limitations, risks, security and privacy concerns of performing an evaluation and management service by telephone and the availability of in person appointments. I also discussed with the patient that there may be a patient responsible charge related to this service. The patient expressed understanding and agreed to proceed.  Location of Patient: Home Location of Provider: Home Persons involved: Patient and provider only   History of Present Illness:   Acid reflux discussion  HPI: Laura [name] is a 69 y.o. female   Summary of history :  She was initially referred and seen on 01/25/2020 for diarrhea.She has previously been a patient of [facility] gastroenterology and was last seen at their office in September 2020. She carries a diagnosis of collagenous colitis noted in 2017. Looking back at colon biopsy results from 2013 the biopsy results are not very specific for collagenous colitis.She was hospitalized in November 2020 and underwent resection of the small bowel due to ischemia. During the surgery she underwent ileum and right colon resection. Had a second look surgery.  09/18/2019: CT scan of the abdomen shows status post distal small bowel resection with right hemicolectomy and enterocolonic anastomosis.  01/02/2020: Ferritin 25. B12 and folate normal. Hemoglobin 10.7 g in January 2021 with an MCV of 85.  She says that prior to the surgery she was having features of constipation alternating with diarrhea. Since the surgery she has been having only diarrhea up to 10-15 times a day. At times she has accidents and she has to  wear a diaper to prevent soiling her undergarments. She denies any use of any artificial sugars, NSAIDs, Metformin. She wishes to transfer care to our care she was on a PPI which has been stopped.  01/27/2020: Fecal calprotectin normal, celiac serology negative.  Stool for C. difficile toxin was negative.  GI PCR of her stool showed enteroaggregative E. coli positive.  Commenced on azithromycin.  Interval history   02/16/2020-3/16/20121  03/04/2020: Stool PCR- negative  Diarrhea : slowed down yesterday with imodium and questran . Rescheduled colonoscopy . Says had a heart cath a few days back. Been advised to to increase digoxin .   Main issue is acid reflux with regurgitation. Been worse recently and not on any PPI or H2 blockers.    Current Outpatient Medications  Medication Sig Dispense Refill  . albuterol (VENTOLIN HFA) 108 (90 Base) MCG/ACT inhaler Inhale 2 puffs into the lungs every 4 (four) hours as needed for wheezing or shortness of breath. 18 g 3  . ALPRAZolam (XANAX) 0.25 MG tablet Take 1 tablet (0.25 mg total) by mouth 2 (two) times daily. (Patient taking differently: Take 0.25 mg by mouth 2 (two) times daily as needed (anxiety). ) 45 tablet 3  . apixaban (ELIQUIS) 5 MG TABS tablet Take 1 tablet (5 mg total) by mouth 2 (two) times daily. 60 tablet 8  . budesonide (PULMICORT) 0.25 MG/2ML nebulizer solution Take 2 mLs (0.25 mg total) by nebulization 2 (two) times daily. 60 mL 12  . cholestyramine (QUESTRAN) 4 g packet Take 1 packet (4 g total) by mouth 2 (two) times daily. [place]  packet 5  . cyanocobalamin (,VITAMIN B-12,) 1000 MCG/ML injection Inject 1,000 mcg into the muscle every 30 (thirty) days.    . digoxin (LANOXIN) 0.25 MG tablet Take 0.5 tablet (0.125 mg) by mouth once daily (Patient taking differently: Take 0.125 mg by mouth daily. ) 45 tablet 2  . diltiazem (CARDIZEM CD) 360 MG 24 hr capsule Take 1 capsule (360 mg total) by mouth daily. 30 capsule 5  . EPINEPHrine 0.3 mg/0.3  mL IJ SOAJ injection Inject 0.3 mg into the muscle as needed for anaphylaxis.     . furosemide (LASIX) 20 MG tablet Take 1 tablet (20 mg total) by mouth daily. 30 tablet 4  . ipratropium-albuterol (DUONEB) 0.5-2.5 (3) MG/3ML SOLN Take 3 mLs by nebulization every 4 (four) hours as needed. (Patient taking differently: Take 3 mLs by nebulization every 4 (four) hours as needed (shortness of breath / wheezing). ) 120 mL 3  . levalbuterol (XOPENEX) 0.31 MG/3ML nebulizer solution Take 3 mLs by nebulization every 4 (four) hours as needed. For wheezing/ and or shortness of breath.    . losartan (COZAAR) 25 MG tablet Take 1 tablet (25 mg total) by mouth daily. (Patient taking differently: Take 25 mg by mouth daily with lunch. ) 90 tablet 2  . montelukast (SINGULAIR) 10 MG tablet TAKE 1 TABLET BY MOUTH EVERY DAY (Patient taking differently: Take 10 mg by mouth at bedtime. ) 90 tablet 3  . Multiple Vitamin (MULTIVITAMIN WITH MINERALS) TABS tablet Take 1 tablet by mouth daily.    . ondansetron (ZOFRAN) 4 MG tablet Take 1 tablet (4 mg total) by mouth every 8 (eight) hours as needed for nausea or vomiting. (Patient not taking: Reported on 02/27/2020) 60 tablet 1  . predniSONE (DELTASONE) 10 MG tablet TAKE 1 TABLET (10 MG TOTAL) BY MOUTH DAILY WITH BREAKFAST. 90 tablet 1  . Probiotic Product (PROBIOTIC-10) CAPS Take 1 capsule by mouth daily.     Marland Kitchen spironolactone (ALDACTONE) 25 MG tablet Take 1 tablet (25 mg total) by mouth 2 (two) times daily. 180 tablet 0  . theophylline (THEODUR) 300 MG 12 hr tablet Take 1 tablet (300 mg total) by mouth 2 (two) times daily. 180 tablet 1   Current Facility-Administered Medications  Medication Dose Route Frequency Provider Last Rate Last Admin  . cyanocobalamin ((VITAMIN B-12)) injection 1,000 mcg  1,000 mcg Intramuscular Once Ronnell Freshwater, NP        Allergies as of 03/06/2020 - Review Complete 02/27/2020  Allergen Reaction Noted  . Flecainide Shortness Of Breath 11/27/2016    . Metoprolol Shortness Of Breath and Swelling 01/13/2020  . Propafenone Shortness Of Breath and Swelling 12/01/2016  . Rivaroxaban Other (See Comments) 10/23/2016    Review of Systems:    All systems reviewed and negative except where noted in HPI.   Observations/Objective:  Labs: CMP     Component Value Date/Time   NA 135 03/02/2020 1446   NA 143 01/02/2020 1452   NA 140 11/28/2013 2312   K 4.8 03/02/2020 1446   K 4.1 11/28/2013 2312   CL 102 03/02/2020 1446   CL 108 (H) 11/28/2013 2312   CO2 25 03/02/2020 1446   CO2 27 11/28/2013 2312   GLUCOSE 167 (H) 03/02/2020 1446   GLUCOSE 130 (H) 11/28/2013 2312   BUN 27 (H) 03/02/2020 1446   BUN 13 01/02/2020 1452   BUN 29 (H) 11/28/2013 2312   CREATININE 0.87 03/02/2020 1446   CREATININE 0.79 11/28/2013 2312   CALCIUM 9.5 03/02/2020  1446   CALCIUM 8.7 11/28/2013 2312   PROT 6.1 (L) 09/12/2019 0449   PROT 6.4 09/10/2018 0741   PROT 6.9 11/28/2013 2312   ALBUMIN 3.5 09/12/2019 0449   ALBUMIN 4.0 09/10/2018 0741   ALBUMIN 3.6 11/28/2013 2312   AST 21 09/12/2019 0449   AST 24 11/28/2013 2312   ALT 17 09/12/2019 0449   ALT 21 11/28/2013 2312   ALKPHOS 55 09/12/2019 0449   ALKPHOS 65 11/28/2013 2312   BILITOT 1.0 09/12/2019 0449   BILITOT 0.6 09/10/2018 0741   BILITOT 0.3 11/28/2013 2312   GFRNONAA >60 03/02/2020 1446   GFRNONAA >60 11/28/2013 2312   GFRAA >60 03/02/2020 1446   GFRAA >60 11/28/2013 2312   Lab Results  Component Value Date   WBC 9.6 02/21/2020   HGB 13.5 02/21/2020   HCT 42.5 02/21/2020   MCV 89.5 02/21/2020   PLT 315 02/21/2020    Imaging Studies: CT Angio Chest W/Cm &/Or Wo Cm  Result Date: 02/07/2020 CLINICAL DATA:  Shortness of breath EXAM: CT ANGIOGRAPHY CHEST WITH CONTRAST TECHNIQUE: Multidetector CT imaging of the chest was performed using the standard protocol during bolus administration of intravenous contrast. Multiplanar CT image reconstructions and MIPs were obtained to evaluate the  vascular anatomy. CONTRAST:  168mL OMNIPAQUE IOHEXOL 350 MG/ML SOLN COMPARISON:  CT angiogram chest July lata 2017 FINDINGS: Cardiovascular: There is no demonstrable pulmonary embolus. There is no thoracic aortic aneurysm or dissection. Visualized great vessels appear unremarkable except for calcification at the origin of the left subclavian artery. There are foci of aortic atherosclerosis. There are foci of coronary artery calcification. There is no pericardial effusion or pericardial thickening. The main pulmonary outflow tract measures 3.5 cm, prominent. Mediastinum/Nodes: Visualized thyroid appears normal. There is no appreciable thoracic adenopathy. No esophageal lesions are appreciable. Lungs/Pleura: There are scattered areas of atelectatic change. There is no edema or airspace opacity. No pleural effusions evident. Upper Abdomen: There is upper abdominal aortic atherosclerosis. There is a probable cyst in the right lobe of the liver measuring 7 mm. Visualized upper abdominal structures otherwise appear unremarkable. Musculoskeletal: There is degenerative change in the thoracic spine. No blastic or lytic bone lesions. No chest wall lesions evident. Review of the MIP images confirms the above findings. IMPRESSION: 1. No demonstrable pulmonary embolus. No thoracic aortic aneurysm or dissection. There is aortic atherosclerosis as well as foci of great vessel and coronary artery calcification. 2. Prominence of the main pulmonary outflow tract is indicative of a degree of pulmonary arterial hypertension. 3.  Areas of scattered atelectasis.  No edema or airspace opacity. 4.  No evident adenopathy. Aortic Atherosclerosis (ICD10-I70.0). Electronically Signed   By: Lowella Grip III M.D.   On: 02/07/2020 10:20   CARDIAC CATHETERIZATION  Result Date: 02/27/2020  Colon [place] Cx lesion is 20% stenosed.  Prox LAD lesion is 20% stenosed.  Prox LAD to Mid LAD lesion is 30% stenosed.  1.  Mild nonobstructive coronary artery  disease.  Left dominant coronary arteries. 2.  Left ventricular angiography was not performed.  EF was mildly reduced by echo. 3.  Right heart catheterization showed moderately elevated filling pressures with an RA of 15 mmHg, pulmonary capillary wedge pressure of 24 mmHg, PA pressure of 43/24 with a mean of 30 mmHg.  Normal cardiac output at 5.18 with a cardiac index of 2.34.  Pulmonary vascular resistance was only 1.16 Woods units. Recommendations: No significant coronary artery disease to require revascularization. Pulmonary hypertension seems to be all related to left  sided heart failure.  The patient is volume overloaded.  She used to be on 40 mg of furosemide but that was discontinued when she was having diarrhea.  I am going to resume furosemide at the lower dose of 20 mg daily.    Assessment and Plan:   Laura [name] is a 69 y.o. y/o female  here to follow-up for diarrhea. She underwent a ileal and right colonic resection in September 2020 following an episode of bowel ischemia. There is a mention of collagenous colitis in her last gastroenterologist notes but by reviewing the pathology report from her last colonoscopy in 2013 the biopsy results do not fit the condition completely.She could be having diarrhea from the ileal resection related to bile salt mediated. She could also have diarrhea from an underlying condition that is yet to be diagnosed such as microscopic colitis.PPI has been stopped which I agree as it can cause microscopic colitis at times.Now having acid reflux.   Plan 1.  Continue Questran, imodium and proceed with colonoscopy  2. Commence on Pepcid BID  F/u in 4-6 weeks     I discussed the assessment and treatment plan with the patient. The patient was provided an opportunity to ask questions and all were answered. The patient agreed with the plan and demonstrated an understanding of the instructions.   The patient was advised to call back or seek an in-person  evaluation if the symptoms worsen or if the condition fails to improve as anticipated.  I provided 12 minutes of non-face-to-face time during this encounter.  Dr Laura Bellows MD,MRCP [facility]) Gastroenterology/Hepatology Pager: [phone]   Speech recognition software was used to dictate this note.

## 2020-03-07 ENCOUNTER — Ambulatory Visit: Payer: BC Managed Care – PPO | Admitting: Gastroenterology

## 2020-03-08 NOTE — Telephone Encounter (Signed)
Spoke with the patient. She is doing well. Given past medical history and time since last visit, based on ACC/AHA guidelines, Laura [name] would be at acceptable risk for the planned procedure without further cardiovascular testing.   I will route this recommendation to the requesting party via Epic fax function and remove from pre-op pool.  Please call with questions.  [employer], [place] 03/08/2020, 9:25 AM

## 2020-03-15 ENCOUNTER — Other Ambulatory Visit
Admission: RE | Admit: 2020-03-15 | Discharge: 2020-03-15 | Disposition: A | Discharge status: Home / Self Care | Payer: BC Managed Care – PPO | Source: Ambulatory Visit | Attending: Gastroenterology | Admitting: Gastroenterology

## 2020-03-15 DIAGNOSIS — Z01812 Encounter for preprocedural laboratory examination: Secondary | ICD-10-CM | POA: Insufficient documentation

## 2020-03-15 DIAGNOSIS — Z20822 Contact with and (suspected) exposure to covid-19: Secondary | ICD-10-CM | POA: Diagnosis not present

## 2020-03-15 LAB — SARS CORONAVIRUS 2 (TAT 6-24 HRS): SARS Coronavirus 2: NEGATIVE

## 2020-03-15 NOTE — Progress Notes (Signed)
Office Visit    Patient Name: Laura [name] Date of Encounter: 03/15/2020  Primary Care Provider:  [name], MD Primary Cardiologist:  Kathlyn Sacramento, MD Electrophysiologist:  None   Chief Complaint    Laura [name] is a 69 y.o. female with a hx of CAD, chronic atrial fibrillation, obesity, OSA on CPAP, asthma, essential hypertension, chronic systolic heart failure presents today for follow up after cardiac catheterization.   Past Medical History    Past Medical History:  Diagnosis Date  . Asthma   . Chronic atrial fibrillation ([place])    a. diagnosed in 09/2016; b. failed flecainide and propafenone due to LE swelling and SOB, could not afford Multaq; c. CHADS2VASc => 3 (HTN, age x 1, female); d. on Eliquis  . GERD (gastroesophageal reflux disease)   . History of stress test    a. Lexiscan Myoview 10/2016: no evidence of ischemia, EF 53%  . Hypertension   . Obesity   . Obstructive sleep apnea   . Pulmonary hypertension ([place])   . Systolic dysfunction    a. TTE 10/2016: EF 50%, mild LVH, moderately dilated LA, moderate MR/TR, mild pulmonary hypertension   Past Surgical History:  Procedure Laterality Date  . BOWEL RESECTION  09/11/2019   Procedure: SMALL BOWEL RESECTION;  Surgeon: Herbert Pun, MD;  Location: [facility] ORS;  Service: General;;  . cataract surgery    . INCISION AND DRAINAGE ABSCESS Right 06/29/2016   Procedure: INCISION AND DRAINAGE ABSCESS;  Surgeon: Florene Glen, MD;  Location: [facility] ORS;  Service: General;  Laterality: Right;  . INCISION AND DRAINAGE OF WOUND Left 06/29/2016   Procedure: IRRIGATION AND DEBRIDEMENT WOUND;  Surgeon: Florene Glen, MD;  Location: [facility] ORS;  Service: General;  Laterality: Left;  . LAPAROSCOPIC RIGHT COLECTOMY  09/11/2019   Procedure: RIGHT COLECTOMY;  Surgeon: Herbert Pun, MD;  Location: [facility] ORS;  Service: General;;  . LAPAROSCOPY N/A 09/11/2019   Procedure: LAPAROSCOPY DIAGNOSTIC;  Surgeon:  Herbert Pun, MD;  Location: [facility] ORS;  Service: General;  Laterality: N/A;  . LAPAROTOMY N/A 09/13/2019   Procedure: REOPENING OF RECENT LAPAROTOMYANASTOMOSIS OF BOWEL;  Surgeon: Herbert Pun, MD;  Location: [facility] ORS;  Service: General;  Laterality: N/A;  . RIGHT/LEFT HEART CATH AND CORONARY ANGIOGRAPHY Bilateral 02/27/2020   Procedure: RIGHT/LEFT HEART CATH AND CORONARY ANGIOGRAPHY;  Surgeon: Wellington Hampshire, MD;  Location: [facility];  Service: Cardiovascular;  Laterality: Bilateral;  . VISCERAL ANGIOGRAPHY N/A 09/12/2019   Procedure: VISCERAL ANGIOGRAPHY;  Surgeon: Algernon Huxley, MD;  Location: [facility];  Service: Cardiovascular;  Laterality: N/A;    Allergies  Allergies  Allergen Reactions  . Flecainide Shortness Of Breath  . Metoprolol Shortness Of Breath and Swelling  . Propafenone Shortness Of Breath and Swelling  . Rivaroxaban Other (See Comments)    History of Present Illness    Laura [name] is a 69 y.o. female with a hx of chronic atrial fibrillation, obesity, OSA on CPAP, asthma, essential hypertension, chronic systolic heart failure. She was last seen for cardiac catheterization.  Previous cardiac workup 2017 with pharmacologic nuclear stress test with no evidence of ischemia and EF 53%. Echo at that time with mildly reduced LVEF 50%, mild LVH, moderately dilated LA, moderate MR and TR with mild pulmonary hypertension.   Her atrial fibrillation is rate controlled as she was intolerant of multiple arrhythmics.  She has been previously intolerant of beta-blocker due to her lung disease.  Hospitalized [phone]  ischemic colitis. Underwent ileum and right colon resection.  Seen for worsening dyspnea. Metoprolol was attempted in place of Diltiazem, but she did not tolerate due to lung disease. Digoxin was added for rate control. Echo January 2021 with LVEF 40-45% and dilataed IVC. Recent CT of lungs by Dr. Humphrey Rolls for lung disease found dilated  pulmonary artery suggestive of pulmonary hypertension. At last office visit noted chest pain and continued dyspnea. Underwent right and left cardiac catheterization 02/27/20 with mild nonobstructive disease, pulmonary hypertension related to left sided heart failure, Lasix 20mg  was resumed.  Reports feeling well.  She is pleased with her response to the Lasix.  Tells me her lower extremity edema and shortness of breath and fatigue have improved.  Her diarrhea remains unchanged and has not worsened while being on Lasix.  She underwent colonoscopy yesterday as part of her work-up for her diarrhea.  We reviewed her cardiac catheterization in detail and typical medical management of mild nonobstructive coronary disease.  Reports no chest pain, pressure, tightness.  Reports no shortness of breath at rest.  Her dyspnea on exertion is stable at baseline.  Tells me she plans to retire in 4 weeks. She works as a Librarian, academic. Is planning to get her medicine through [email or web address].    EKGs/Labs/Other Studies Reviewed:   The following studies were reviewed today:  Cath 02/27/20  Ost Cx lesion is 20% stenosed.  Prox LAD lesion is 20% stenosed.  Prox LAD to Mid LAD lesion is 30% stenosed.   1.  Mild nonobstructive coronary artery disease.  Left dominant coronary arteries. 2.  Left ventricular angiography was not performed.  EF was mildly reduced by echo. 3.  Right heart catheterization showed moderately elevated filling pressures with an RA of 15 mmHg, pulmonary capillary wedge pressure of 24 mmHg, PA pressure of 43/24 with a mean of 30 mmHg.  Normal cardiac output at 5.18 with a cardiac index of 2.34.  Pulmonary vascular resistance was only 1.16 Woods units.   Recommendations: No significant coronary artery disease to require revascularization. Pulmonary hypertension seems to be all related to left sided heart failure.  The patient is volume overloaded.  She used to be on 40 mg of furosemide but that was  discontinued when she was having diarrhea.  I am going to resume furosemide at the lower dose of 20 mg daily.  Echo 01/19/20  1. Left ventricular ejection fraction, by visual estimation, is 40 to  45%. The left ventricle has mild to moderately decreased function. There  is no left ventricular hypertrophy.   2. Global right ventricle has mildly reduced systolic function.The right  ventricular size is mildly enlarged. No increase in right ventricular wall  thickness.   3. Left ventricular diastolic parameters are indeterminate.   4. The left ventricle demonstrates global hypokinesis.   5. Left atrial size was severely dilated.   6. The inferior vena cava is dilated in size with <50% respiratory  variability, suggesting right atrial pressure of 15 mmHg.   7. Rhythm is atrial fibrillation, rate 130 bpm.    Recent Labs: 09/12/2019: ALT 17 01/02/2020: Magnesium 2.1; TSH 1.810 02/21/2020: Hemoglobin 13.5; Platelets 315 03/02/2020: BUN 27; Creatinine, Ser 0.87; Potassium 4.8; Sodium 135  Recent Lipid Panel    Component Value Date/Time   CHOL 189 09/10/2018 0741   TRIG 104 09/10/2018 0741   HDL 55 09/10/2018 0741   CHOLHDL 3.4 09/10/2018 0741   LDLCALC 113 (H) 09/10/2018 0741    Home Medications   No outpatient  medications have been marked as taking for the 03/20/20 encounter (Appointment) with Loel Dubonnet, NP.   Current Facility-Administered Medications for the 03/20/20 encounter (Appointment) with Loel Dubonnet, NP  Medication  . cyanocobalamin ((VITAMIN B-12)) injection 1,000 mcg    Review of Systems   Review of Systems  Constitution: Negative for chills, fever and malaise/fatigue.  Cardiovascular: Positive for leg swelling. Negative for chest pain, dyspnea on exertion, near-syncope, orthopnea, palpitations and syncope.  Respiratory: Negative for cough, shortness of breath and wheezing.   Gastrointestinal: Negative for nausea and vomiting.  Neurological: Negative for  dizziness, light-headedness and weakness.   All other systems reviewed and are otherwise negative except as noted above.  Physical Exam    VS:  There were no vitals taken for this visit. , BMI There is no height or weight on file to calculate BMI. GEN: Well nourished, well developed, in no acute distress. HEENT: normal. Neck: Supple, no JVD, carotid bruits, or masses. Cardiac: irregularly irregular, no murmurs, rubs, or gallops. No clubbing, cyanosis, edema.  Radials/DP/PT 2+ and equal bilaterally.  Respiratory:  Respirations regular and unlabored, clear to auscultation bilaterally. GI: Soft, nontender, nondistended, BS + x 4. MS: No deformity or atrophy. Skin: Warm and dry, no rash.  Right radial cath site clean, dry, intact with no erythema, ecchymosis, hematoma. Neuro:  Strength and sensation are intact. Psych: Normal affect.  Accessory Clinical Findings    ECG personally reviewed by me today -atrial fibrillation rate controlled at 77 bpm with left axis deviation and incomplete right bundle branch block- no acute changes.  Assessment & Plan    1. Coronary artery disease - Mild nonobstructive disease by cath 03/15/20.  Cardiac cath results reviewed with her in depth. Cath site healing well.  No aspirin secondary to chronic anticoagulation.  No beta-blocker secondary to previous intolerance with fatigue, lung disease.  Will check lipid panel today to determine which potency statin to add.  Low-sodium, heart healthy diet encouraged.  Regular cardiovascular exercise encouraged.  2. Chronic atrial fibrillation/chronic anticoagulation -rate controlled on EKG today at 77 bpm.  Continue rate control with digoxin and diltiazem - refills provided.  Continue anticoagulation with Eliquis for CHA2DS2-VASc of at least 4.  Denies bleeding complications.     3. High risk medication use - Digoxin for atrial fibrillation. Digoxin level 03/02/20 0.3.  As her rate is well controlled today on EKG we will not  further uptitrate dose.  4. OSA - CPAP use encouraged.  5. HLD, LDL goal <70 -no recent lipid panel.  Collect lipid panel today.  LDL less than 70 in setting of CAD.  Anticipate addition of rosuvastatin.  Await lipid panel results to determine dosage.  6. HTN -blood pressure well controlled continue present antihypertensive regimen.  7. Chronic systolic heart failure -LVEF 40-45% by echo 12/2019.  Right heart catheterization 02/2020 with pulmonary hypertension related to left-sided heart failure.  Improvement in volume status since addition of Lasix 20 mg daily. Continue at present dose and refill provided. GDMT includes loop diuretic, MRA, ARB. No beta blocker secondary to intolerance.   Repeat BMP today.  Disposition: Follow up in 2-3 month(s) with Dr. Fletcher Anon or APP   Loel Dubonnet, NP 03/15/2020, 3:18 PM

## 2020-03-16 ENCOUNTER — Encounter: Payer: Self-pay | Admitting: Gastroenterology

## 2020-03-19 ENCOUNTER — Ambulatory Visit
Admission: RE | Admit: 2020-03-19 | Discharge: 2020-03-19 | Disposition: A | Discharge status: Home / Self Care | Payer: BC Managed Care – PPO | Source: Ambulatory Visit | Attending: Gastroenterology | Admitting: Gastroenterology

## 2020-03-19 ENCOUNTER — Ambulatory Visit: Payer: BC Managed Care – PPO | Admitting: Anesthesiology

## 2020-03-19 ENCOUNTER — Other Ambulatory Visit: Payer: Self-pay

## 2020-03-19 ENCOUNTER — Encounter
Admission: RE | Disposition: A | Discharge status: Home / Self Care | Payer: Self-pay | Source: Ambulatory Visit | Attending: Gastroenterology

## 2020-03-19 ENCOUNTER — Encounter: Payer: Self-pay | Admitting: Gastroenterology

## 2020-03-19 DIAGNOSIS — Z825 Family history of asthma and other chronic lower respiratory diseases: Secondary | ICD-10-CM | POA: Diagnosis not present

## 2020-03-19 DIAGNOSIS — K529 Noninfective gastroenteritis and colitis, unspecified: Secondary | ICD-10-CM | POA: Insufficient documentation

## 2020-03-19 DIAGNOSIS — I272 Pulmonary hypertension, unspecified: Secondary | ICD-10-CM | POA: Diagnosis not present

## 2020-03-19 DIAGNOSIS — D1339 Benign neoplasm of other parts of small intestine: Secondary | ICD-10-CM | POA: Insufficient documentation

## 2020-03-19 DIAGNOSIS — Z8249 Family history of ischemic heart disease and other diseases of the circulatory system: Secondary | ICD-10-CM | POA: Diagnosis not present

## 2020-03-19 DIAGNOSIS — R197 Diarrhea, unspecified: Secondary | ICD-10-CM | POA: Diagnosis not present

## 2020-03-19 DIAGNOSIS — I482 Chronic atrial fibrillation, unspecified: Secondary | ICD-10-CM | POA: Diagnosis not present

## 2020-03-19 DIAGNOSIS — G4733 Obstructive sleep apnea (adult) (pediatric): Secondary | ICD-10-CM | POA: Diagnosis not present

## 2020-03-19 DIAGNOSIS — Z79899 Other long term (current) drug therapy: Secondary | ICD-10-CM | POA: Diagnosis not present

## 2020-03-19 DIAGNOSIS — Z6841 Body Mass Index (BMI) 40.0 and over, adult: Secondary | ICD-10-CM | POA: Diagnosis not present

## 2020-03-19 DIAGNOSIS — K219 Gastro-esophageal reflux disease without esophagitis: Secondary | ICD-10-CM | POA: Insufficient documentation

## 2020-03-19 DIAGNOSIS — J449 Chronic obstructive pulmonary disease, unspecified: Secondary | ICD-10-CM | POA: Insufficient documentation

## 2020-03-19 DIAGNOSIS — I10 Essential (primary) hypertension: Secondary | ICD-10-CM | POA: Insufficient documentation

## 2020-03-19 DIAGNOSIS — Z888 Allergy status to other drugs, medicaments and biological substances status: Secondary | ICD-10-CM | POA: Insufficient documentation

## 2020-03-19 DIAGNOSIS — E669 Obesity, unspecified: Secondary | ICD-10-CM | POA: Insufficient documentation

## 2020-03-19 DIAGNOSIS — Z980 Intestinal bypass and anastomosis status: Secondary | ICD-10-CM | POA: Insufficient documentation

## 2020-03-19 DIAGNOSIS — D128 Benign neoplasm of rectum: Secondary | ICD-10-CM | POA: Insufficient documentation

## 2020-03-19 DIAGNOSIS — Z7901 Long term (current) use of anticoagulants: Secondary | ICD-10-CM | POA: Insufficient documentation

## 2020-03-19 DIAGNOSIS — D126 Benign neoplasm of colon, unspecified: Secondary | ICD-10-CM | POA: Diagnosis not present

## 2020-03-19 HISTORY — PX: COLONOSCOPY WITH PROPOFOL: SHX5780

## 2020-03-19 HISTORY — DX: Chronic obstructive pulmonary disease, unspecified: J44.9

## 2020-03-19 SURGERY — COLONOSCOPY WITH PROPOFOL
Anesthesia: General

## 2020-03-19 MED ORDER — LIDOCAINE HCL (CARDIAC) PF 100 MG/5ML IV SOSY
PREFILLED_SYRINGE | INTRAVENOUS | Status: DC | PRN
  Administered 2020-03-19: 100 mg via INTRAVENOUS

## 2020-03-19 MED ORDER — PROPOFOL 500 MG/50ML IV EMUL
INTRAVENOUS | Status: DC | PRN
  Administered 2020-03-19: 120 ug/kg/min via INTRAVENOUS

## 2020-03-19 MED ORDER — PROPOFOL 10 MG/ML IV BOLUS
INTRAVENOUS | Status: DC | PRN
  Administered 2020-03-19: 80 mg via INTRAVENOUS

## 2020-03-19 MED ORDER — SODIUM CHLORIDE 0.9 % IV SOLN: INTRAVENOUS | Status: DC

## 2020-03-19 NOTE — Transfer of Care (Signed)
Immediate Anesthesia Transfer of Care Note  Patient: Laura [name]  Procedure(s) Performed: COLONOSCOPY WITH PROPOFOL (N/A )  Patient Location: PACU and Endoscopy Unit  Anesthesia Type:General  Level of Consciousness: awake, oriented, drowsy and patient cooperative  Airway & Oxygen Therapy: Patient Spontanous Breathing  Post-op Assessment: Report given to RN and Post -op Vital signs reviewed and stable  Post vital signs: Reviewed and stable  Last Vitals:  Vitals Value Taken Time  BP 131/80 03/19/20 1038  Temp 36.7 C 03/19/20 1038  Pulse 83 03/19/20 1039  Resp 17 03/19/20 1039  SpO2 99 % 03/19/20 1039  Vitals shown include unvalidated device data.  Last Pain:  Vitals:   03/19/20 1038  TempSrc: Temporal  PainSc: 0-No pain         Complications: No apparent anesthesia complications

## 2020-03-19 NOTE — Anesthesia Preprocedure Evaluation (Addendum)
Anesthesia Evaluation  Patient identified by MRN, date of birth, ID band Patient awake    Reviewed: Allergy & Precautions, NPO status , Patient's Chart, lab work & pertinent test results  History of Anesthesia Complications Negative for: history of anesthetic complications  Airway Mallampati: III  TM Distance: >3 FB Neck ROM: Full    Dental  (+) Partial Lower, Partial Upper   Pulmonary sleep apnea, Continuous Positive Airway Pressure Ventilation and Oxygen sleep apnea , COPD,  COPD inhaler,    breath sounds clear to auscultation- rhonchi (-) wheezing      Cardiovascular hypertension, Pt. on medications + Peripheral Vascular Disease  (-) CAD, (-) Past MI, (-) Cardiac Stents and (-) CABG + dysrhythmias Atrial Fibrillation  Rhythm:Regular Rate:Normal - Systolic murmurs and - Diastolic murmurs Echo [id number]: 1. Left ventricular ejection fraction, by visual estimation, is 40 to  45%. The left ventricle has mild to moderately decreased function. There  is no left ventricular hypertrophy.  2. Global right ventricle has mildly reduced systolic function.The right  ventricular size is mildly enlarged. No increase in right ventricular wall  thickness.  3. Left ventricular diastolic parameters are indeterminate.  4. The left ventricle demonstrates global hypokinesis.  5. Left atrial size was severely dilated.  6. The inferior vena cava is dilated in size with <50% respiratory  variability, suggesting right atrial pressure of 15 mmHg.  7. Rhythm is atrial fibrillation, rate 130 bpm.    Neuro/Psych neg Seizures PSYCHIATRIC DISORDERS Anxiety negative neurological ROS     GI/Hepatic Neg liver ROS, GERD  ,  Endo/Other  negative endocrine ROSneg diabetes  Renal/GU negative Renal ROS     Musculoskeletal  (+) Arthritis ,   Abdominal (+) + obese,   Peds  Hematology negative hematology ROS (+)   Anesthesia Other Findings Past  Medical History: No date: Asthma No date: Chronic atrial fibrillation ([place])     Comment:  a. diagnosed in 09/2016; b. failed flecainide and               propafenone due to LE swelling and SOB, could not afford               Multaq; c. CHADS2VASc => 3 (HTN, age x 1, female); d. on               Eliquis No date: GERD (gastroesophageal reflux disease) No date: History of stress test     Comment:  a. [place] 10/2016: no evidence of ischemia, EF              53% No date: Hypertension No date: Obesity No date: Obstructive sleep apnea No date: Pulmonary hypertension ([place]) No date: Systolic dysfunction     Comment:  a. TTE 10/2016: EF 50%, mild LVH, moderately dilated LA,              moderate MR/TR, mild pulmonary hypertension   Reproductive/Obstetrics                            Anesthesia Physical Anesthesia Plan  ASA: III  Anesthesia Plan: General   Post-op Pain Management:    Induction: Intravenous  PONV Risk Score and Plan: 2 and Propofol infusion  Airway Management Planned: Natural Airway  Additional Equipment:   Intra-op Plan:   Post-operative Plan:   Informed Consent: I have reviewed the patients History and Physical, chart, labs and discussed the procedure including the risks, benefits and alternatives for  the proposed anesthesia with the patient or authorized representative who has indicated his/her understanding and acceptance.     Dental advisory given  Plan Discussed with: CRNA and Anesthesiologist  Anesthesia Plan Comments:         Anesthesia Quick Evaluation

## 2020-03-19 NOTE — Op Note (Signed)
[facility] Patient Name: Laura [name] Procedure Date: 03/19/2020 10:10 AM MRN: [id number] Account #: [id number] Date of Birth: 09-Nov-1951 Admit Type: Outpatient Age: 69 Room: [facility] ENDO ROOM 4 Gender: Female Note Status: Finalized Procedure:             Colonoscopy Indications:           Chronic diarrhea Providers:             [name] MD, MD Medicines:             Monitored Anesthesia Care Complications:         No immediate complications. Procedure:             Pre-Anesthesia Assessment:                        - Prior to the procedure, a History and Physical was                         performed, and patient medications, allergies and                         sensitivities were reviewed. The patient's tolerance                         of previous anesthesia was reviewed.                        - The risks and benefits of the procedure and the                         sedation options and risks were discussed with the                         patient. All questions were answered and informed                         consent was obtained.                        - ASA Grade Assessment: II - A patient with mild                         systemic disease.                        After obtaining informed consent, the colonoscope was                         passed under direct vision. Throughout the procedure,                         the patient's blood pressure, pulse, and oxygen                         saturations were monitored continuously. The                         Colonoscope was introduced through the anus and  advanced to the the ileocolonic anastomosis. The                         colonoscopy was performed with ease. The patient                         tolerated the procedure well. The quality of the bowel                         preparation was excellent. Findings:      The perianal and digital rectal examinations were  normal.      The terminal ileum appeared normal.      There was evidence of a prior end-to-side ileo-colonic anastomosis in       the ascending colon. This was patent and was characterized by healthy       appearing mucosa. The anastomosis was traversed. This was biopsied with       a cold forceps for histology.      Normal mucosa was found in the entire colon. Biopsies for histology were       taken with a cold forceps from the entire colon for evaluation of       microscopic colitis.      The exam was otherwise without abnormality on direct and retroflexion       views. Impression:            - The examined portion of the ileum was normal.                        - Patent end-to-side ileo-colonic anastomosis,                         characterized by healthy appearing mucosa. Biopsied.                        - Normal mucosa in the entire examined colon. Biopsied.                        - The examination was otherwise normal on direct and                         retroflexion views. Recommendation:        - Discharge patient to home (with escort).                        - Resume previous diet.                        - Continue present medications.                        - Await pathology results.                        - Return to my office as previously scheduled. Procedure Code(s):     --- Professional ---                        [phone], Colonoscopy, flexible; with biopsy, single or  multiple Diagnosis Code(s):     --- Professional ---                        Z98.0, Intestinal bypass and anastomosis status                        K52.9, Noninfective gastroenteritis and colitis,                         unspecified CPT copyright 2019 American Medical Association. All rights reserved. The codes documented in this report are preliminary and upon coder review may  be revised to meet current compliance requirements. [name], MD [name] MD, MD 03/19/2020 10:36:27  AM This report has been signed electronically. Number of Addenda: 0 Note Initiated On: 03/19/2020 10:10 AM Scope Withdrawal Time: 0 hours 9 minutes 9 seconds  Total Procedure Duration: 0 hours 12 minutes 17 seconds  Estimated Blood Loss:  Estimated blood loss: none.      [facility]

## 2020-03-19 NOTE — H&P (Signed)
Laura Bellows, MD [address], [place], [place], [place], [address] [address], [place], [place], [place], [address] Phone: [phone]  Fax: [phone]  Primary Care Physician:  [name], MD   Pre-Procedure History & Physical: HPI:  Laura [name] is a 69 y.o. female is here for an colonoscopy.   Past Medical History:  Diagnosis Date  . Asthma   . Chronic atrial fibrillation ([place])    a. diagnosed in 09/2016; b. failed flecainide and propafenone due to LE swelling and SOB, could not afford Multaq; c. CHADS2VASc => 3 (HTN, age x 1, female); d. on Eliquis  . COPD (chronic obstructive pulmonary disease) ([place])   . GERD (gastroesophageal reflux disease)   . History of stress test    a. Lexiscan Myoview 10/2016: no evidence of ischemia, EF 53%  . Hypertension   . Obesity   . Obstructive sleep apnea   . Pulmonary hypertension ([place])   . Systolic dysfunction    a. TTE 10/2016: EF 50%, mild LVH, moderately dilated LA, moderate MR/TR, mild pulmonary hypertension    Past Surgical History:  Procedure Laterality Date  . BOWEL RESECTION  09/11/2019   Procedure: SMALL BOWEL RESECTION;  Surgeon: Herbert Pun, MD;  Location: [facility] ORS;  Service: General;;  . CARDIAC CATHETERIZATION    . cataract surgery    . CORONARY ANGIOPLASTY    . INCISION AND DRAINAGE ABSCESS Right 06/29/2016   Procedure: INCISION AND DRAINAGE ABSCESS;  Surgeon: Florene Glen, MD;  Location: [facility] ORS;  Service: General;  Laterality: Right;  . INCISION AND DRAINAGE OF WOUND Left 06/29/2016   Procedure: IRRIGATION AND DEBRIDEMENT WOUND;  Surgeon: Florene Glen, MD;  Location: [facility] ORS;  Service: General;  Laterality: Left;  . LAPAROSCOPIC RIGHT COLECTOMY  09/11/2019   Procedure: RIGHT COLECTOMY;  Surgeon: Herbert Pun, MD;  Location: [facility] ORS;  Service: General;;  . LAPAROSCOPY N/A 09/11/2019   Procedure: LAPAROSCOPY DIAGNOSTIC;  Surgeon: Herbert Pun, MD;  Location: [facility] ORS;   Service: General;  Laterality: N/A;  . LAPAROTOMY N/A 09/13/2019   Procedure: REOPENING OF RECENT LAPAROTOMYANASTOMOSIS OF BOWEL;  Surgeon: Herbert Pun, MD;  Location: [facility] ORS;  Service: General;  Laterality: N/A;  . RIGHT/LEFT HEART CATH AND CORONARY ANGIOGRAPHY Bilateral 02/27/2020   Procedure: RIGHT/LEFT HEART CATH AND CORONARY ANGIOGRAPHY;  Surgeon: Wellington Hampshire, MD;  Location: [place] [facility];  Service: Cardiovascular;  Laterality: Bilateral;  . VISCERAL ANGIOGRAPHY N/A 09/12/2019   Procedure: VISCERAL ANGIOGRAPHY;  Surgeon: Algernon Huxley, MD;  Location: [place] [facility];  Service: Cardiovascular;  Laterality: N/A;    Prior to Admission medications   Medication Sig Start Date End Date Taking? Authorizing Provider  albuterol (VENTOLIN HFA) 108 (90 Base) MCG/ACT inhaler Inhale 2 puffs into the lungs every 4 (four) hours as needed for wheezing or shortness of breath. 07/07/19  Yes Boscia, Greer Ee, NP  apixaban (ELIQUIS) 5 MG TABS tablet Take 1 tablet (5 mg total) by mouth 2 (two) times daily. 10/06/19  Yes Wellington Hampshire, MD  budesonide (PULMICORT) 0.25 MG/2ML nebulizer solution Take 2 mLs (0.25 mg total) by nebulization 2 (two) times daily. 02/02/20  Yes Scarboro, Audie Clear, NP  cholestyramine (QUESTRAN) 4 g packet Take 1 packet (4 g total) by mouth 2 (two) times daily. 01/25/20 07/23/20 Yes Laura Bellows, MD  cyanocobalamin (,VITAMIN B-12,) 1000 MCG/ML injection Inject 1,000 mcg into the muscle every 30 (thirty) days.   Yes [provider]  digoxin (  LANOXIN) 0.25 MG tablet Take 0.5 tablet (0.125 mg) by mouth once daily Patient taking differently: Take 0.125 mg by mouth daily.  02/17/20  Yes Wellington Hampshire, MD  diltiazem (CARDIZEM CD) 360 MG 24 hr capsule Take 1 capsule (360 mg total) by mouth daily. 01/20/20  Yes Wellington Hampshire, MD  EPINEPHrine 0.3 mg/0.3 mL IJ SOAJ injection Inject 0.3 mg into the muscle as needed for anaphylaxis.    Yes [provider]    furosemide (LASIX) 20 MG tablet Take 1 tablet (20 mg total) by mouth daily. 02/27/20 02/26/21 Yes Wellington Hampshire, MD  ipratropium-albuterol (DUONEB) 0.5-2.5 (3) MG/3ML SOLN Take 3 mLs by nebulization every 4 (four) hours as needed. Patient taking differently: Take 3 mLs by nebulization every 4 (four) hours as needed (shortness of breath / wheezing).  02/03/19  Yes Boscia, Greer Ee, NP  levalbuterol (XOPENEX) 0.31 MG/3ML nebulizer solution Take 3 mLs by nebulization every 4 (four) hours as needed. For wheezing/ and or shortness of breath. 02/05/16  Yes [provider]  losartan (COZAAR) 25 MG tablet Take 1 tablet (25 mg total) by mouth daily. Patient taking differently: Take 25 mg by mouth daily with lunch.  07/27/19  Yes Boscia, Heather E, NP  montelukast (SINGULAIR) 10 MG tablet TAKE 1 TABLET BY MOUTH EVERY DAY Patient taking differently: Take 10 mg by mouth at bedtime.  04/23/19  Yes Ronnell Freshwater, NP  Multiple Vitamin (MULTIVITAMIN WITH MINERALS) TABS tablet Take 1 tablet by mouth daily.   Yes [provider]  ondansetron (ZOFRAN) 4 MG tablet Take 1 tablet (4 mg total) by mouth every 8 (eight) hours as needed for nausea or vomiting. 09/30/19  Yes Boscia, Heather E, NP  predniSONE (DELTASONE) 10 MG tablet TAKE 1 TABLET (10 MG TOTAL) BY MOUTH DAILY WITH BREAKFAST. 01/12/20  Yes Scarboro, Audie Clear, NP  Probiotic Product (PROBIOTIC-10) CAPS Take 1 capsule by mouth daily.    Yes [provider]  spironolactone (ALDACTONE) 25 MG tablet Take 1 tablet (25 mg total) by mouth 2 (two) times daily. 10/20/19  Yes Boscia, Greer Ee, NP  theophylline (THEODUR) 300 MG 12 hr tablet Take 1 tablet (300 mg total) by mouth 2 (two) times daily. 01/05/20  Yes Boscia, Greer Ee, NP  ALPRAZolam (XANAX) 0.25 MG tablet Take 1 tablet (0.25 mg total) by mouth 2 (two) times daily. Patient not taking: Reported on 03/19/2020 01/31/20   Ronnell Freshwater, NP    Allergies as of 03/06/2020 - Review Complete  02/27/2020  Allergen Reaction Noted  . Flecainide Shortness Of Breath 11/27/2016  . Metoprolol Shortness Of Breath and Swelling 01/13/2020  . Propafenone Shortness Of Breath and Swelling 12/01/2016  . Rivaroxaban Other (See Comments) 10/23/2016    Family History  Problem Relation Age of Onset  . Dementia Mother   . Osteoporosis Mother   . Vascular Disease Mother   . COPD Father     Social History   Socioeconomic History  . Marital status: Married    Spouse name: [name]   . Number of children: 2  . Years of education: Not on file  . Highest education level: Not on file  Occupational History  . Not on file  Tobacco Use  . Smoking status: Never Smoker  . Smokeless tobacco: Never Used  Substance and Sexual Activity  . Alcohol use: No  . Drug use: No  . Sexual activity: Not on file  Other Topics Concern  . Not on file  Social  History Narrative   3 grandchildren: 7 great grandkids.    Social Determinants of Health   Financial Resource Strain:   . Difficulty of Paying Living Expenses:   Food Insecurity:   . Worried About Charity fundraiser in the Last Year:   . Arboriculturist in the Last Year:   Transportation Needs:   . Film/video editor (Medical):   Marland Kitchen Lack of Transportation (Non-Medical):   Physical Activity:   . Days of Exercise per Week:   . Minutes of Exercise per Session:   Stress:   . Feeling of Stress :   Social Connections:   . Frequency of Communication with Friends and Family:   . Frequency of Social Gatherings with Friends and Family:   . Attends Religious Services:   . Active Member of Clubs or Organizations:   . Attends Archivist Meetings:   Marland Kitchen Marital Status:   Intimate Partner Violence:   . Fear of Current or Ex-Partner:   . Emotionally Abused:   Marland Kitchen Physically Abused:   . Sexually Abused:     Review of Systems: See HPI, otherwise negative ROS  Physical Exam: BP (!) 179/79   Pulse (!) 57   Temp 97.6 F (36.4 C)  (Temporal)   Resp (!) 22   Ht 5' 4.5" (1.638 m)   Wt 117.9 kg   LMP  (LMP Unknown)   SpO2 100%   BMI 43.94 kg/m  General:   Alert,  pleasant and cooperative in NAD Head:  Normocephalic and atraumatic. Neck:  Supple; no masses or thyromegaly. Lungs:  Clear throughout to auscultation, normal respiratory effort.    Heart:  +S1, +S2, Regular rate and rhythm, No edema. Abdomen:  Soft, nontender and nondistended. Normal bowel sounds, without guarding, and without rebound.   Neurologic:  Alert and  oriented x4;  grossly normal neurologically.  Impression/Plan: Laura [name] is here for an colonoscopy to be performed for diarrhea.Risks, benefits, limitations, and alternatives regarding  colonoscopy have been reviewed with the patient.  Questions have been answered.  All parties agreeable.   Laura Bellows, MD  03/19/2020, 10:09 AM

## 2020-03-19 NOTE — Anesthesia Postprocedure Evaluation (Signed)
Anesthesia Post Note  Patient: Laura [name]  Procedure(s) Performed: COLONOSCOPY WITH PROPOFOL (N/A )  Patient location during evaluation: PACU Anesthesia Type: General Level of consciousness: awake and alert Pain management: pain level controlled Vital Signs Assessment: post-procedure vital signs reviewed and stable Respiratory status: spontaneous breathing, nonlabored ventilation and respiratory function stable Cardiovascular status: blood pressure returned to baseline and stable Postop Assessment: no apparent nausea or vomiting Anesthetic complications: no     Last Vitals:  Vitals:   03/19/20 1038 03/19/20 1058  BP: 131/80 (!) 156/94  Pulse: 94   Resp: 17   Temp: 36.7 C   SpO2: 97%     Last Pain:  Vitals:   03/19/20 1058  TempSrc:   PainSc: 0-No pain                 Tera Mater

## 2020-03-20 ENCOUNTER — Encounter: Payer: Self-pay | Admitting: *Deleted

## 2020-03-20 ENCOUNTER — Ambulatory Visit (INDEPENDENT_AMBULATORY_CARE_PROVIDER_SITE_OTHER): Payer: BC Managed Care – PPO | Admitting: Family

## 2020-03-20 ENCOUNTER — Encounter: Payer: Self-pay | Admitting: Family

## 2020-03-20 VITALS — BP 120/60 | HR 77 | Temp 97.2°F | Ht 64.5 in | Wt 259.5 lb

## 2020-03-20 DIAGNOSIS — I5022 Chronic systolic (congestive) heart failure: Secondary | ICD-10-CM

## 2020-03-20 DIAGNOSIS — Z9989 Dependence on other enabling machines and devices: Secondary | ICD-10-CM

## 2020-03-20 DIAGNOSIS — Z7901 Long term (current) use of anticoagulants: Secondary | ICD-10-CM | POA: Diagnosis not present

## 2020-03-20 DIAGNOSIS — Z79899 Other long term (current) drug therapy: Secondary | ICD-10-CM | POA: Diagnosis not present

## 2020-03-20 DIAGNOSIS — I2510 Atherosclerotic heart disease of native coronary artery without angina pectoris: Secondary | ICD-10-CM

## 2020-03-20 DIAGNOSIS — I482 Chronic atrial fibrillation, unspecified: Secondary | ICD-10-CM

## 2020-03-20 DIAGNOSIS — G4733 Obstructive sleep apnea (adult) (pediatric): Secondary | ICD-10-CM

## 2020-03-20 DIAGNOSIS — E785 Hyperlipidemia, unspecified: Secondary | ICD-10-CM

## 2020-03-20 LAB — SURGICAL PATHOLOGY

## 2020-03-20 MED ORDER — FUROSEMIDE 20 MG PO TABS: 20.0000 mg | ORAL_TABLET | Freq: Every day | ORAL | 1 refills | Status: DC

## 2020-03-20 MED ORDER — DILTIAZEM HCL ER COATED BEADS 360 MG PO CP24: 360.0000 mg | ORAL_CAPSULE | Freq: Every day | ORAL | 1 refills | Status: DC

## 2020-03-20 MED ORDER — DIGOXIN 125 MCG PO TABS: 0.1250 mg | ORAL_TABLET | Freq: Every day | ORAL | 1 refills | Status: DC

## 2020-03-20 NOTE — Patient Instructions (Addendum)
Medication Instructions:  No medication changes today.   We will start a cholesterol medication based on the labs we collect today.   *If you need a refill on your cardiac medications before your next appointment, please call your pharmacy*   Lab Work: Your physician recommends that you return for lab work today: lipid panel, BMET  If you have labs (blood work) drawn today and your tests are completely normal, you will receive your results only by: Marland Kitchen MyChart Message (if you have MyChart) OR . A paper copy in the mail If you have any lab test that is abnormal or we need to change your treatment, we will call you to review the results.  Testing/Procedures: Your EKG today shows rate controlled atrial fibrillation which is a good result.  Follow-Up: At [facility], you and your health needs are our priority.  As part of our continuing mission to provide you with exceptional heart care, we have created designated Provider Care Teams.  These Care Teams include your primary Cardiologist (physician) and Advanced Practice Providers (APPs -  Physician Assistants and Nurse Practitioners) who all work together to provide you with the care you need, when you need it.  We recommend signing up for the patient portal called "MyChart".  Sign up information is provided on this After Visit Summary.  MyChart is used to connect with patients for Virtual Visits (Telemedicine).  Patients are able to view lab/test results, encounter notes, upcoming appointments, etc.  Non-urgent messages can be sent to your provider as well.   To learn more about what you can do with MyChart, go to [email or web address].    Your next appointment:   2-3 month(s)  The format for your next appointment:   In Person  Provider:    You may see Kathlyn Sacramento, MD or one of the following Advanced Practice Providers on your designated Care Team:    Murray Hodgkins, NP  Christell Faith, PA-C  Marrianne Mood, PA-C

## 2020-03-21 ENCOUNTER — Ambulatory Visit: Payer: BC Managed Care – PPO | Admitting: Gastroenterology

## 2020-03-21 ENCOUNTER — Telehealth: Payer: Self-pay

## 2020-03-21 LAB — BASIC METABOLIC PANEL
BUN/Creatinine Ratio: 16 (ref 12–28)
BUN: 14 mg/dL (ref 8–27)
CO2: 24 mmol/L (ref 20–29)
Calcium: 9.4 mg/dL (ref 8.7–10.3)
Chloride: 102 mmol/L (ref 96–106)
Creatinine, Ser: 0.87 mg/dL (ref 0.57–1.00)
GFR calc Af Amer: 79 mL/min/{1.73_m2} (ref >59)
GFR calc non Af Amer: 68 mL/min/{1.73_m2} (ref >59)
Glucose: 92 mg/dL (ref 65–99)
Potassium: 4.2 mmol/L (ref 3.5–5.2)
Sodium: 144 mmol/L (ref 134–144)

## 2020-03-21 LAB — LIPID PANEL
Chol/HDL Ratio: 2.9 ratio (ref 0.0–4.4)
Cholesterol, Total: 196 mg/dL (ref 100–199)
HDL: 68 mg/dL (ref >39)
LDL Chol Calc (NIH): 103 mg/dL — ABNORMAL HIGH (ref 0–99)
Triglycerides: 142 mg/dL (ref 0–149)
VLDL Cholesterol Cal: 25 mg/dL (ref 5–40)

## 2020-03-21 MED ORDER — ROSUVASTATIN CALCIUM 20 MG PO TABS: 20.0000 mg | ORAL_TABLET | Freq: Every day | ORAL | 3 refills | Status: DC

## 2020-03-21 NOTE — Telephone Encounter (Signed)
-----   Message from Loel Dubonnet, NP sent at 03/21/2020  6:02 AM EDT ----- Cholesterol panel shows LDL (bad cholesterol) of 103. Our goal is less than 70. Normal kidney function and electrolytes.   Recommend starting Crestor 20mg  daily and rechecking lipid/liver at next office visit.

## 2020-03-21 NOTE — Telephone Encounter (Signed)
Call to patient to review labs.    Pt verbalized understanding and has no further questions at this time.  Rx order placed and sent electronically to pt preferred pharmacy.   Advised pt to call for any further questions or concerns.

## 2020-03-27 ENCOUNTER — Ambulatory Visit (INDEPENDENT_AMBULATORY_CARE_PROVIDER_SITE_OTHER): Payer: BC Managed Care – PPO

## 2020-03-27 ENCOUNTER — Other Ambulatory Visit: Payer: Self-pay

## 2020-03-27 DIAGNOSIS — E538 Deficiency of other specified B group vitamins: Secondary | ICD-10-CM

## 2020-03-27 MED ORDER — CYANOCOBALAMIN 1000 MCG/ML IJ SOLN
1000.0000 ug | Freq: Once | INTRAMUSCULAR | Status: AC
  Administered 2020-03-27: 1000 ug via INTRAMUSCULAR

## 2020-03-28 ENCOUNTER — Other Ambulatory Visit: Payer: Self-pay

## 2020-03-28 DIAGNOSIS — J4489 Other specified chronic obstructive pulmonary disease: Secondary | ICD-10-CM

## 2020-03-28 DIAGNOSIS — J209 Acute bronchitis, unspecified: Secondary | ICD-10-CM

## 2020-03-28 DIAGNOSIS — R0602 Shortness of breath: Secondary | ICD-10-CM

## 2020-03-28 DIAGNOSIS — J449 Chronic obstructive pulmonary disease, unspecified: Secondary | ICD-10-CM

## 2020-03-28 DIAGNOSIS — J4550 Severe persistent asthma, uncomplicated: Secondary | ICD-10-CM

## 2020-03-28 MED ORDER — IPRATROPIUM-ALBUTEROL 0.5-2.5 (3) MG/3ML IN SOLN: 3.0000 mL | RESPIRATORY_TRACT | 3 refills | Status: DC | PRN

## 2020-03-28 MED ORDER — SPIRONOLACTONE 25 MG PO TABS: 25.0000 mg | ORAL_TABLET | Freq: Two times a day (BID) | ORAL | 1 refills | Status: DC

## 2020-03-28 MED ORDER — MONTELUKAST SODIUM 10 MG PO TABS: 10.0000 mg | ORAL_TABLET | Freq: Every day | ORAL | 1 refills | Status: DC

## 2020-03-28 MED ORDER — PREDNISONE 10 MG PO TABS: 10.0000 mg | ORAL_TABLET | Freq: Every day | ORAL | 1 refills | Status: DC

## 2020-03-28 MED ORDER — LOSARTAN POTASSIUM 25 MG PO TABS: 25.0000 mg | ORAL_TABLET | Freq: Every day | ORAL | 1 refills | Status: DC

## 2020-03-28 MED ORDER — THEOPHYLLINE ER 300 MG PO TB12: 300.0000 mg | ORAL_TABLET | Freq: Two times a day (BID) | ORAL | 1 refills | Status: DC

## 2020-03-28 MED ORDER — ALBUTEROL SULFATE HFA 108 (90 BASE) MCG/ACT IN AERS: 2.0000 | INHALATION_SPRAY | RESPIRATORY_TRACT | 3 refills | Status: DC | PRN

## 2020-04-03 ENCOUNTER — Encounter: Payer: Self-pay | Admitting: Adult Health

## 2020-04-03 ENCOUNTER — Encounter: Payer: Self-pay | Admitting: Gastroenterology

## 2020-04-03 ENCOUNTER — Other Ambulatory Visit: Payer: Self-pay

## 2020-04-03 ENCOUNTER — Ambulatory Visit (INDEPENDENT_AMBULATORY_CARE_PROVIDER_SITE_OTHER): Payer: BC Managed Care – PPO | Admitting: Adult Health

## 2020-04-03 VITALS — Temp 96.8°F | Ht 64.0 in | Wt 264.0 lb

## 2020-04-03 DIAGNOSIS — G4733 Obstructive sleep apnea (adult) (pediatric): Secondary | ICD-10-CM | POA: Diagnosis not present

## 2020-04-03 DIAGNOSIS — Z9989 Dependence on other enabling machines and devices: Secondary | ICD-10-CM

## 2020-04-03 DIAGNOSIS — R05 Cough: Secondary | ICD-10-CM | POA: Diagnosis not present

## 2020-04-03 DIAGNOSIS — J988 Other specified respiratory disorders: Secondary | ICD-10-CM

## 2020-04-03 DIAGNOSIS — R059 Cough, unspecified: Secondary | ICD-10-CM

## 2020-04-03 MED ORDER — PREDNISONE 10 MG PO TABS: ORAL_TABLET | ORAL | 0 refills | Status: DC

## 2020-04-03 MED ORDER — BENZONATATE 100 MG PO CAPS: 100.0000 mg | ORAL_CAPSULE | Freq: Two times a day (BID) | ORAL | 0 refills | Status: DC | PRN

## 2020-04-03 MED ORDER — AZITHROMYCIN 250 MG PO TABS: ORAL_TABLET | ORAL | 0 refills | Status: DC

## 2020-04-03 NOTE — Progress Notes (Signed)
[facility] [place], [place] [address]  Internal MEDICINE  Telephone Visit  Patient Name: Laura [name]  [id number]  HB:[id number]  Date of Service: 04/03/2020  I connected with the patient at 520 by telephone and verified the patients identity using two identifiers.   I discussed the limitations, risks, security and privacy concerns of performing an evaluation and management service by telephone and the availability of in person appointments. I also discussed with the patient that there may be a patient responsible charge related to the service.  The patient expressed understanding and agrees to proceed.    Chief Complaint  Patient presents with  . Telephone Assessment  . Telephone Screen  . Headache  . Cough  . Sinusitis    HPI Pt is seen via telephone. She reports about 4 days of cough and chest congestion. She has been doing severe cough and congestion meds, and theraflu without results. She reports always getting chest colds from her grandkids and they are living with her now. Denies any fever or other symptoms.       Current Medication: Outpatient Encounter Medications as of 04/03/2020  Medication Sig  . albuterol (VENTOLIN HFA) 108 (90 Base) MCG/ACT inhaler Inhale 2 puffs into the lungs every 4 (four) hours as needed for wheezing or shortness of breath.  . ALPRAZolam (XANAX) 0.25 MG tablet Take 1 tablet (0.25 mg total) by mouth 2 (two) times daily.  Marland Kitchen apixaban (ELIQUIS) 5 MG TABS tablet Take 1 tablet (5 mg total) by mouth 2 (two) times daily.  . budesonide (PULMICORT) 0.25 MG/2ML nebulizer solution Take 2 mLs (0.25 mg total) by nebulization 2 (two) times daily.  . cholestyramine (QUESTRAN) 4 g packet Take 1 packet (4 g total) by mouth 2 (two) times daily.  . cyanocobalamin (,VITAMIN B-12,) 1000 MCG/ML injection Inject 1,000 mcg into the muscle every 30 (thirty) days.  . digoxin (LANOXIN) 0.125 MG tablet Take 1 tablet (0.125 mg total) by mouth daily.   Marland Kitchen diltiazem (CARDIZEM CD) 360 MG 24 hr capsule Take 1 capsule (360 mg total) by mouth daily.  Marland Kitchen EPINEPHrine 0.3 mg/0.3 mL IJ SOAJ injection Inject 0.3 mg into the muscle as needed for anaphylaxis.   . furosemide (LASIX) 20 MG tablet Take 1 tablet (20 mg total) by mouth daily.  Marland Kitchen ipratropium-albuterol (DUONEB) 0.5-2.5 (3) MG/3ML SOLN Take 3 mLs by nebulization every 4 (four) hours as needed.  . levalbuterol (XOPENEX) 0.31 MG/3ML nebulizer solution Take 3 mLs by nebulization every 4 (four) hours as needed. For wheezing/ and or shortness of breath.  . losartan (COZAAR) 25 MG tablet Take 1 tablet (25 mg total) by mouth daily.  . montelukast (SINGULAIR) 10 MG tablet Take 1 tablet (10 mg total) by mouth daily.  . Multiple Vitamin (MULTIVITAMIN WITH MINERALS) TABS tablet Take 1 tablet by mouth daily.  . ondansetron (ZOFRAN) 4 MG tablet Take 1 tablet (4 mg total) by mouth every 8 (eight) hours as needed for nausea or vomiting.  . predniSONE (DELTASONE) 10 MG tablet Take 1 tablet (10 mg total) by mouth daily with breakfast.  . Probiotic Product (PROBIOTIC-10) CAPS Take 1 capsule by mouth daily.   . rosuvastatin (CRESTOR) 20 MG tablet Take 1 tablet (20 mg total) by mouth daily.  Marland Kitchen spironolactone (ALDACTONE) 25 MG tablet Take 1 tablet (25 mg total) by mouth 2 (two) times daily.  . theophylline (THEODUR) 300 MG 12 hr tablet Take 1 tablet (300 mg total) by mouth 2 (two) times daily.  Marland Kitchen  azithromycin (ZITHROMAX) 250 MG tablet Take as directed  . benzonatate (TESSALON) 100 MG capsule Take 1 capsule (100 mg total) by mouth 2 (two) times daily as needed for cough.  . predniSONE (DELTASONE) 10 MG tablet Use per dose pack   Facility-Administered Encounter Medications as of 04/03/2020  Medication Note  . cyanocobalamin ((VITAMIN B-12)) injection 1,000 mcg 02/27/2020: Due 02/28/2020    Surgical History: Past Surgical History:  Procedure Laterality Date  . BOWEL RESECTION  09/11/2019   Procedure: SMALL BOWEL  RESECTION;  Surgeon: Herbert Pun, MD;  Location: [facility] ORS;  Service: General;;  . CARDIAC CATHETERIZATION    . cataract surgery    . COLONOSCOPY WITH PROPOFOL N/A 03/19/2020   Procedure: COLONOSCOPY WITH PROPOFOL;  Surgeon: [name], MD;  Location: [facility] ENDOSCOPY;  Service: Gastroenterology;  Laterality: N/A;  . CORONARY ANGIOPLASTY    . INCISION AND DRAINAGE ABSCESS Right 06/29/2016   Procedure: INCISION AND DRAINAGE ABSCESS;  Surgeon: Florene Glen, MD;  Location: [facility] ORS;  Service: General;  Laterality: Right;  . INCISION AND DRAINAGE OF WOUND Left 06/29/2016   Procedure: IRRIGATION AND DEBRIDEMENT WOUND;  Surgeon: Florene Glen, MD;  Location: [facility] ORS;  Service: General;  Laterality: Left;  . LAPAROSCOPIC RIGHT COLECTOMY  09/11/2019   Procedure: RIGHT COLECTOMY;  Surgeon: Herbert Pun, MD;  Location: [facility] ORS;  Service: General;;  . LAPAROSCOPY N/A 09/11/2019   Procedure: LAPAROSCOPY DIAGNOSTIC;  Surgeon: Herbert Pun, MD;  Location: [facility] ORS;  Service: General;  Laterality: N/A;  . LAPAROTOMY N/A 09/13/2019   Procedure: REOPENING OF RECENT LAPAROTOMYANASTOMOSIS OF BOWEL;  Surgeon: Herbert Pun, MD;  Location: [facility] ORS;  Service: General;  Laterality: N/A;  . RIGHT/LEFT HEART CATH AND CORONARY ANGIOGRAPHY Bilateral 02/27/2020   Procedure: RIGHT/LEFT HEART CATH AND CORONARY ANGIOGRAPHY;  Surgeon: Wellington Hampshire, MD;  Location: [place] CV LAB;  Service: Cardiovascular;  Laterality: Bilateral;  . VISCERAL ANGIOGRAPHY N/A 09/12/2019   Procedure: VISCERAL ANGIOGRAPHY;  Surgeon: Algernon Huxley, MD;  Location: [place] CV LAB;  Service: Cardiovascular;  Laterality: N/A;    Medical History: Past Medical History:  Diagnosis Date  . Asthma   . Chronic atrial fibrillation ([place])    a. diagnosed in 09/2016; b. failed flecainide and propafenone due to LE swelling and SOB, could not afford Multaq; c. CHADS2VASc => 3 (HTN, age x 1, female); d. on  Eliquis  . COPD (chronic obstructive pulmonary disease) ([place])   . GERD (gastroesophageal reflux disease)   . History of stress test    a. Lexiscan Myoview 10/2016: no evidence of ischemia, EF 53%  . Hypertension   . Obesity   . Obstructive sleep apnea   . Pulmonary hypertension ([place])   . Systolic dysfunction    a. TTE 10/2016: EF 50%, mild LVH, moderately dilated LA, moderate MR/TR, mild pulmonary hypertension    Family History: Family History  Problem Relation Age of Onset  . Dementia Mother   . Osteoporosis Mother   . Vascular Disease Mother   . COPD Father     Social History   Socioeconomic History  . Marital status: Married    Spouse name: [name]   . Number of children: 2  . Years of education: Not on file  . Highest education level: Not on file  Occupational History  . Not on file  Tobacco Use  . Smoking status: Never Smoker  . Smokeless tobacco: Never Used  Substance and Sexual Activity  . Alcohol use: No  . Drug  use: No  . Sexual activity: Not on file  Other Topics Concern  . Not on file  Social History Narrative   3 grandchildren: 7 great grandkids.    Social Determinants of Health   Financial Resource Strain:   . Difficulty of Paying Living Expenses:   Food Insecurity:   . Worried About Charity fundraiser in the Last Year:   . Arboriculturist in the Last Year:   Transportation Needs:   . Film/video editor (Medical):   Marland Kitchen Lack of Transportation (Non-Medical):   Physical Activity:   . Days of Exercise per Week:   . Minutes of Exercise per Session:   Stress:   . Feeling of Stress :   Social Connections:   . Frequency of Communication with Friends and Family:   . Frequency of Social Gatherings with Friends and Family:   . Attends Religious Services:   . Active Member of Clubs or Organizations:   . Attends Archivist Meetings:   Marland Kitchen Marital Status:   Intimate Partner Violence:   . Fear of Current or Ex-Partner:   . Emotionally  Abused:   Marland Kitchen Physically Abused:   . Sexually Abused:       Review of Systems  Constitutional: Negative for chills, fatigue and unexpected weight change.  HENT: Negative for congestion, rhinorrhea, sneezing and sore throat.   Eyes: Negative for photophobia, pain and redness.  Respiratory: Negative for cough, chest tightness and shortness of breath.   Cardiovascular: Negative for chest pain and palpitations.  Gastrointestinal: Negative for abdominal pain, constipation, diarrhea, nausea and vomiting.  Endocrine: Negative.   Genitourinary: Negative for dysuria and frequency.  Musculoskeletal: Negative for arthralgias, back pain, joint swelling and neck pain.  Skin: Negative for rash.  Allergic/Immunologic: Negative.   Neurological: Negative for tremors and numbness.  Hematological: Negative for adenopathy. Does not bruise/bleed easily.  Psychiatric/Behavioral: Negative for behavioral problems and sleep disturbance. The patient is not nervous/anxious.     Vital Signs: Temp (!) 96.8 F (36 C)   Ht 5\' 4"  (1.626 m)   Wt 264 lb (119.7 kg)   LMP  (LMP Unknown)   BMI 45.32 kg/m    Observation/Objective:  Hoarse sounding, NAD noted   Assessment/Plan: 1. Respiratory infection Advised patient to take entire course of antibiotics as prescribed with food. Pt should return to clinic in 7-10 days if symptoms fail to improve or new symptoms develop.  - predniSONE (DELTASONE) 10 MG tablet; Use per dose pack  Dispense: 21 tablet; Refill: 0 - azithromycin (ZITHROMAX) 250 MG tablet; Take as directed  Dispense: 6 tablet; Refill: 0  2. Cough Use tessalon, as prescribed.   - benzonatate (TESSALON) 100 MG capsule; Take 1 capsule (100 mg total) by mouth 2 (two) times daily as needed for cough.  Dispense: 20 capsule; Refill: 0  3. OSA on CPAP Continue to use cpap at night.   General Counseling: evyana oestreicher understanding of the findings of today's phone visit and agrees with plan of  treatment. I have discussed any further diagnostic evaluation that may be needed or ordered today. We also reviewed her medications today. she has been encouraged to call the office with any questions or concerns that should arise related to todays visit.    No orders of the defined types were placed in this encounter.   Meds ordered this encounter  Medications  . predniSONE (DELTASONE) 10 MG tablet    Sig: Use per dose pack  Dispense:  21 tablet    Refill:  0  . azithromycin (ZITHROMAX) 250 MG tablet    Sig: Take as directed    Dispense:  6 tablet    Refill:  0  . benzonatate (TESSALON) 100 MG capsule    Sig: Take 1 capsule (100 mg total) by mouth 2 (two) times daily as needed for cough.    Dispense:  20 capsule    Refill:  0    Time spent: [place] AGNP-C Internal medicine

## 2020-04-09 ENCOUNTER — Other Ambulatory Visit: Payer: Self-pay

## 2020-04-09 MED ORDER — FUROSEMIDE 20 MG PO TABS: 20.0000 mg | ORAL_TABLET | Freq: Every day | ORAL | 1 refills | Status: DC

## 2020-04-11 ENCOUNTER — Other Ambulatory Visit: Payer: Self-pay

## 2020-04-11 MED ORDER — FUROSEMIDE 20 MG PO TABS: 20.0000 mg | ORAL_TABLET | Freq: Every day | ORAL | 1 refills | Status: DC

## 2020-04-12 ENCOUNTER — Ambulatory Visit (INDEPENDENT_AMBULATORY_CARE_PROVIDER_SITE_OTHER): Payer: BC Managed Care – PPO | Admitting: Gastroenterology

## 2020-04-12 ENCOUNTER — Other Ambulatory Visit: Payer: Self-pay

## 2020-04-12 VITALS — BP 147/66 | HR 65 | Temp 98.1°F | Ht 64.0 in | Wt 267.8 lb

## 2020-04-12 DIAGNOSIS — K580 Irritable bowel syndrome with diarrhea: Secondary | ICD-10-CM | POA: Diagnosis not present

## 2020-04-12 DIAGNOSIS — I2510 Atherosclerotic heart disease of native coronary artery without angina pectoris: Secondary | ICD-10-CM

## 2020-04-12 NOTE — Progress Notes (Signed)
[name] MD, MRCP(U.K) [address]  [place], [place] [address]  Main: [phone]  Fax: [phone]   Primary Care Physician: [name], MD  Primary Gastroenterologist:  Dr. [name]   Chief Complaint  Patient presents with  . Follow-up    Diarrhea, GERD    HPI: Laura [name] is a 69 y.o. female    Summary of history :  She was initially referred and seen on 01/25/2020 for diarrhea.She has previously been a patient of [facility] gastroenterology and was last seen at their office in September 2020. She carries a diagnosis of collagenous colitis noted in 2017. Looking back at colon biopsy results from 2013 the biopsy resultsare notvery specific for collagenous colitis.She was hospitalized in November 2020 and underwent resection of the small bowel due to ischemia. During the surgery she underwent ileum and right colon resection. Had a second look surgery.  09/18/2019: CT scan of the abdomen shows status post distal small bowel resection with right hemicolectomy and enterocolonic anastomosis.  01/02/2020: Ferritin 25. B12 and folate normal. Hemoglobin 10.7 g in January 2021 with an MCV of 85.  Shesays that prior to the surgery she was having features of constipation alternating with diarrhea. Since the surgery she has been having only diarrhea up to 10-15 times a day. At times she has accidents and she has to wear a diaper to prevent soiling her undergarments. She denies any use of any artificial sugars, NSAIDs, Metformin. She wishes to transfer care to our care she was on a PPI which has been stopped.  01/27/2020: Fecal calprotectin normal, celiac serology negative. Stool for C. difficile toxin was negative. GI PCR of her stool showed enteroaggregative E. coli positive. Commenced on azithromycin.  03/04/2020: Stool PCR- negative    Interval history3/16/2021-04/12/2020  03/19/2020: Biopsies taken of the terminal ileum as well as colon.  No  evidence of any form of colitis or inflammation.  Since last visit she has been taking Questran and Imodium.  She is definitely better but not completely better.  She still switches between more diarrhea and some episodes of constipation.  She wants to return to having a normal diet.  She has noticed that certain foods containing lactose make her diarrhea worse.  She does not consume any artificial sugars or sweeteners.    Current Outpatient Medications  Medication Sig Dispense Refill  . albuterol (VENTOLIN HFA) 108 (90 Base) MCG/ACT inhaler Inhale 2 puffs into the lungs every 4 (four) hours as needed for wheezing or shortness of breath. 18 g 3  . ALPRAZolam (XANAX) 0.25 MG tablet Take 1 tablet (0.25 mg total) by mouth 2 (two) times daily. 45 tablet 3  . apixaban (ELIQUIS) 5 MG TABS tablet Take 1 tablet (5 mg total) by mouth 2 (two) times daily. 60 tablet 8  . azithromycin (ZITHROMAX) 250 MG tablet Take as directed 6 tablet 0  . benzonatate (TESSALON) 100 MG capsule Take 1 capsule (100 mg total) by mouth 2 (two) times daily as needed for cough. 20 capsule 0  . budesonide (PULMICORT) 0.25 MG/2ML nebulizer solution Take 2 mLs (0.25 mg total) by nebulization 2 (two) times daily. 60 mL 12  . cholestyramine (QUESTRAN) 4 g packet Take 1 packet (4 g total) by mouth 2 (two) times daily. 60 packet 5  . cyanocobalamin (,VITAMIN B-12,) 1000 MCG/ML injection Inject 1,000 mcg into the muscle every 30 (thirty) days.    . digoxin (LANOXIN) 0.125 MG tablet Take 1 tablet (0.125  mg total) by mouth daily. 90 tablet 1  . diltiazem (CARDIZEM CD) 360 MG 24 hr capsule Take 1 capsule (360 mg total) by mouth daily. 90 capsule 1  . EPINEPHrine 0.3 mg/0.3 mL IJ SOAJ injection Inject 0.3 mg into the muscle as needed for anaphylaxis.     . furosemide (LASIX) 20 MG tablet Take 1 tablet (20 mg total) by mouth daily. 90 tablet 1  . ipratropium-albuterol (DUONEB) 0.5-2.5 (3) MG/3ML SOLN Take 3 mLs by nebulization every 4 (four)  hours as needed. 120 mL 3  . levalbuterol (XOPENEX) 0.31 MG/3ML nebulizer solution Take 3 mLs by nebulization every 4 (four) hours as needed. For wheezing/ and or shortness of breath.    . losartan (COZAAR) 25 MG tablet Take 1 tablet (25 mg total) by mouth daily. 90 tablet 1  . montelukast (SINGULAIR) 10 MG tablet Take 1 tablet (10 mg total) by mouth daily. 90 tablet 1  . Multiple Vitamin (MULTIVITAMIN WITH MINERALS) TABS tablet Take 1 tablet by mouth daily.    . ondansetron (ZOFRAN) 4 MG tablet Take 1 tablet (4 mg total) by mouth every 8 (eight) hours as needed for nausea or vomiting. 60 tablet 1  . predniSONE (DELTASONE) 10 MG tablet Take 1 tablet (10 mg total) by mouth daily with breakfast. 90 tablet 1  . predniSONE (DELTASONE) 10 MG tablet Use per dose pack 21 tablet 0  . Probiotic Product (PROBIOTIC-10) CAPS Take 1 capsule by mouth daily.     . rosuvastatin (CRESTOR) 20 MG tablet Take 1 tablet (20 mg total) by mouth daily. 90 tablet 3  . spironolactone (ALDACTONE) 25 MG tablet Take 1 tablet (25 mg total) by mouth 2 (two) times daily. 180 tablet 1  . theophylline (THEODUR) 300 MG 12 hr tablet Take 1 tablet (300 mg total) by mouth 2 (two) times daily. 180 tablet 1   Current Facility-Administered Medications  Medication Dose Route Frequency Provider Last Rate Last Admin  . cyanocobalamin ((VITAMIN B-12)) injection 1,000 mcg  1,000 mcg Intramuscular Once Ronnell Freshwater, NP        Allergies as of 04/12/2020 - Review Complete 04/12/2020  Allergen Reaction Noted  . Flecainide Shortness Of Breath 11/27/2016  . Metoprolol Shortness Of Breath and Swelling 01/13/2020  . Propafenone Shortness Of Breath and Swelling 12/01/2016  . Rivaroxaban Other (See Comments) 10/23/2016    ROS:  General: Negative for anorexia, weight loss, fever, chills, fatigue, weakness. ENT: Negative for hoarseness, difficulty swallowing , nasal congestion. CV: Negative for chest pain, angina, palpitations, dyspnea on  exertion, peripheral edema.  Respiratory: Negative for dyspnea at rest, dyspnea on exertion, cough, sputum, wheezing.  GI: See history of present illness. GU:  Negative for dysuria, hematuria, urinary incontinence, urinary frequency, nocturnal urination.  Endo: Negative for unusual weight change.    Physical Examination:   BP (!) 147/66   Pulse 65   Temp 98.1 F (36.7 C)   Ht 5\' 4"  (1.626 m)   Wt 267 lb 12.8 oz (121.5 kg)   LMP  (LMP Unknown)   BMI 45.97 kg/m   General: Well-nourished, well-developed in no acute distress.  Eyes: No icterus. Conjunctivae pink. Psych: Alert and cooperative, normal mood and affect.   Imaging Studies: No results found.  Assessment and Plan:   Laura [name] is a 69 y.o. y/o female here to follow-upfor diarrhea. She underwent a ileal and right colonic resection in September 2020 following an episode of bowel ischemia. She could be having diarrhea from the  ileal resection related to bile salt mediated.  Her symptoms are suggestive of IBS-D.   Plan 1.Continue Imodium and Questran.  Add Creon tablets samples will be provided.  This is going to be done to empirically treat any extrapancreatic insufficiency.  If does not work at next visit I will treat her with Xifaxan for IBS-D. Equal calprotectin has been negative in the past.   Dr [name]  MD,MRCP [facility]) Follow up in 2 weeks telephone visit

## 2020-04-18 ENCOUNTER — Other Ambulatory Visit: Payer: Self-pay | Admitting: Cardiovascular Disease

## 2020-04-18 MED ORDER — APIXABAN 5 MG PO TABS: 5.0000 mg | ORAL_TABLET | Freq: Two times a day (BID) | ORAL | 1 refills | Status: DC

## 2020-04-18 NOTE — Telephone Encounter (Signed)
*  STAT* If patient is at the pharmacy, call can be transferred to refill team.   1. Which medications need to be refilled? (please list name of each medication and dose if known) Eliquis 5 MG   2. Which pharmacy/location (including street and city if local pharmacy) is medication to be sent to? [facility] in [place]  3. Do they need a 30 day or 90 day supply? 90 day  .

## 2020-04-18 NOTE — Telephone Encounter (Signed)
Please review for refill. Thanks!  

## 2020-04-18 NOTE — Telephone Encounter (Signed)
Pt's age 69, wt 121.5 kg, SCr 0.87, CrCl 115.41, last ov w/ CW 03/20/30. Refill sent in as requested.

## 2020-04-20 ENCOUNTER — Telehealth: Payer: Self-pay

## 2020-04-20 NOTE — Telephone Encounter (Signed)
Lmom to confirm and screen for 04-24-20 ov.

## 2020-04-24 ENCOUNTER — Ambulatory Visit: Payer: BC Managed Care – PPO

## 2020-04-24 ENCOUNTER — Encounter: Payer: Self-pay | Admitting: Nurse Practitioner

## 2020-04-24 ENCOUNTER — Other Ambulatory Visit: Payer: Self-pay

## 2020-04-24 ENCOUNTER — Ambulatory Visit (INDEPENDENT_AMBULATORY_CARE_PROVIDER_SITE_OTHER): Payer: Medicare Other | Admitting: Nurse Practitioner

## 2020-04-24 VITALS — BP 148/63 | HR 68 | Temp 97.5°F | Resp 16 | Ht 64.0 in | Wt 272.0 lb

## 2020-04-24 DIAGNOSIS — I4891 Unspecified atrial fibrillation: Secondary | ICD-10-CM

## 2020-04-24 DIAGNOSIS — I872 Venous insufficiency (chronic) (peripheral): Secondary | ICD-10-CM | POA: Diagnosis not present

## 2020-04-24 DIAGNOSIS — E538 Deficiency of other specified B group vitamins: Secondary | ICD-10-CM

## 2020-04-24 DIAGNOSIS — Z9989 Dependence on other enabling machines and devices: Secondary | ICD-10-CM

## 2020-04-24 DIAGNOSIS — G4733 Obstructive sleep apnea (adult) (pediatric): Secondary | ICD-10-CM

## 2020-04-24 DIAGNOSIS — J4550 Severe persistent asthma, uncomplicated: Secondary | ICD-10-CM

## 2020-04-24 DIAGNOSIS — K559 Vascular disorder of intestine, unspecified: Secondary | ICD-10-CM

## 2020-04-24 MED ORDER — CYANOCOBALAMIN 1000 MCG/ML IJ SOLN
1000.0000 ug | Freq: Once | INTRAMUSCULAR | Status: AC
  Administered 2020-04-24: 1000 ug via INTRAMUSCULAR

## 2020-04-24 NOTE — Progress Notes (Signed)
[facility] [place], [place] [address]  Internal MEDICINE  Office Visit Note  Patient Name: Laura [name]  [id number]  [id number]  Date of Service: 05/20/2020  Chief Complaint  Patient presents with  . Gastroesophageal Reflux  . Hypertension  . Asthma    The patient is here for routine follow up.  She states that her GI doctor started her on creon, as Lucrezia Starch did not help with frequent diarrhea and cramping. She states that this has helped reduce the diarrhea and she is able to eat more than what she was eating after her last visit. Legs continue to swell. She does get cramping and weakness in her legs. Gets worse when she is dehydrated. Keeping her water intake up helps her a great deal.  Difficult for her to afford eliquis. Is applying for assistance from manufacturer. Unsure of cost of creon. She is stable with her breathing. Not having to use nebulizer as often. Blood pressure is generally well managed. She is due to have B12 injection today.       Current Medication: Outpatient Encounter Medications as of 04/24/2020  Medication Sig  . benzonatate (TESSALON) 100 MG capsule Take 1 capsule (100 mg total) by mouth 2 (two) times daily as needed for cough.  . [DISCONTINUED] lipase/protease/amylase (CREON) 36000 UNITS CPEP capsule Take 36,000 Units by mouth.  Marland Kitchen albuterol (VENTOLIN HFA) 108 (90 Base) MCG/ACT inhaler Inhale 2 puffs into the lungs every 4 (four) hours as needed for wheezing or shortness of breath.  . ALPRAZolam (XANAX) 0.25 MG tablet Take 1 tablet (0.25 mg total) by mouth 2 (two) times daily.  . budesonide (PULMICORT) 0.25 MG/2ML nebulizer solution Take 2 mLs (0.25 mg total) by nebulization 2 (two) times daily.  . cholestyramine (QUESTRAN) 4 g packet Take 1 packet (4 g total) by mouth 2 (two) times daily.  . cyanocobalamin (,VITAMIN B-12,) 1000 MCG/ML injection Inject 1,000 mcg into the muscle every 30 (thirty) days.  . digoxin (LANOXIN) 0.125  MG tablet Take 1 tablet (0.125 mg total) by mouth daily.  Marland Kitchen diltiazem (CARDIZEM CD) 360 MG 24 hr capsule Take 1 capsule (360 mg total) by mouth daily.  Marland Kitchen EPINEPHrine 0.3 mg/0.3 mL IJ SOAJ injection Inject 0.3 mg into the muscle as needed for anaphylaxis.   . furosemide (LASIX) 20 MG tablet Take 1 tablet (20 mg total) by mouth daily.  Marland Kitchen ipratropium-albuterol (DUONEB) 0.5-2.5 (3) MG/3ML SOLN Take 3 mLs by nebulization every 4 (four) hours as needed.  . levalbuterol (XOPENEX) 0.31 MG/3ML nebulizer solution Take 3 mLs by nebulization every 4 (four) hours as needed. For wheezing/ and or shortness of breath.  . losartan (COZAAR) 25 MG tablet Take 1 tablet (25 mg total) by mouth daily.  . montelukast (SINGULAIR) 10 MG tablet Take 1 tablet (10 mg total) by mouth daily.  . Multiple Vitamin (MULTIVITAMIN WITH MINERALS) TABS tablet Take 1 tablet by mouth daily.  . ondansetron (ZOFRAN) 4 MG tablet Take 1 tablet (4 mg total) by mouth every 8 (eight) hours as needed for nausea or vomiting.  . predniSONE (DELTASONE) 10 MG tablet Take 1 tablet (10 mg total) by mouth daily with breakfast.  . Probiotic Product (PROBIOTIC-10) CAPS Take 1 capsule by mouth daily.   . rosuvastatin (CRESTOR) 20 MG tablet Take 1 tablet (20 mg total) by mouth daily.  Marland Kitchen spironolactone (ALDACTONE) 25 MG tablet Take 1 tablet (25 mg total) by mouth 2 (two) times daily.  . theophylline (THEODUR) 300 MG 12  hr tablet Take 1 tablet (300 mg total) by mouth 2 (two) times daily.  . [DISCONTINUED] apixaban (ELIQUIS) 5 MG TABS tablet Take 1 tablet (5 mg total) by mouth 2 (two) times daily.  . [DISCONTINUED] azithromycin (ZITHROMAX) 250 MG tablet Take as directed (Patient not taking: Reported on 04/12/2020)  . [DISCONTINUED] predniSONE (DELTASONE) 10 MG tablet Use per dose pack (Patient not taking: Reported on 04/24/2020)   Facility-Administered Encounter Medications as of 04/24/2020  Medication Note  . cyanocobalamin ((VITAMIN B-12)) injection 1,000 mcg  02/27/2020: Due 02/28/2020  . [COMPLETED] cyanocobalamin ((VITAMIN B-12)) injection 1,000 mcg     Surgical History: Past Surgical History:  Procedure Laterality Date  . BOWEL RESECTION  09/11/2019   Procedure: SMALL BOWEL RESECTION;  Surgeon: Herbert Pun, MD;  Location: [facility] ORS;  Service: General;;  . CARDIAC CATHETERIZATION    . cataract surgery    . COLONOSCOPY WITH PROPOFOL N/A 03/19/2020   Procedure: COLONOSCOPY WITH PROPOFOL;  Surgeon: [name], MD;  Location: [facility] ENDOSCOPY;  Service: Gastroenterology;  Laterality: N/A;  . CORONARY ANGIOPLASTY    . INCISION AND DRAINAGE ABSCESS Right 06/29/2016   Procedure: INCISION AND DRAINAGE ABSCESS;  Surgeon: Florene Glen, MD;  Location: [facility] ORS;  Service: General;  Laterality: Right;  . INCISION AND DRAINAGE OF WOUND Left 06/29/2016   Procedure: IRRIGATION AND DEBRIDEMENT WOUND;  Surgeon: Florene Glen, MD;  Location: [facility] ORS;  Service: General;  Laterality: Left;  . LAPAROSCOPIC RIGHT COLECTOMY  09/11/2019   Procedure: RIGHT COLECTOMY;  Surgeon: Herbert Pun, MD;  Location: [facility] ORS;  Service: General;;  . LAPAROSCOPY N/A 09/11/2019   Procedure: LAPAROSCOPY DIAGNOSTIC;  Surgeon: Herbert Pun, MD;  Location: [facility] ORS;  Service: General;  Laterality: N/A;  . LAPAROTOMY N/A 09/13/2019   Procedure: REOPENING OF RECENT LAPAROTOMYANASTOMOSIS OF BOWEL;  Surgeon: Herbert Pun, MD;  Location: [facility] ORS;  Service: General;  Laterality: N/A;  . RIGHT/LEFT HEART CATH AND CORONARY ANGIOGRAPHY Bilateral 02/27/2020   Procedure: RIGHT/LEFT HEART CATH AND CORONARY ANGIOGRAPHY;  Surgeon: Wellington Hampshire, MD;  Location: [facility];  Service: Cardiovascular;  Laterality: Bilateral;  . VISCERAL ANGIOGRAPHY N/A 09/12/2019   Procedure: VISCERAL ANGIOGRAPHY;  Surgeon: Algernon Huxley, MD;  Location: [place] [facility];  Service: Cardiovascular;  Laterality: N/A;    Medical History: Past Medical History:  Diagnosis Date   . Asthma   . Chronic atrial fibrillation ([place])    a. diagnosed in 09/2016; b. failed flecainide and propafenone due to LE swelling and SOB, could not afford Multaq; c. CHADS2VASc => 5 (CHF, HTN, age x 1, nonobs CAD, female)--> Eliquis  . COPD (chronic obstructive pulmonary disease) ([place])   . GERD (gastroesophageal reflux disease)   . HFmrEF (heart failure with mid-range ejection fraction) ([place])    a. 12/2019 Echo: EF 40-45%.  . Hyperlipidemia   . Hypertension   . NICM (nonischemic cardiomyopathy) ([place])    a. 12/2019 Echo: EF 40-45%, glob HK, mildly reduced RV fxn, sev dil LA. *HR 130 (afib) during study.  . Nonobstructive CAD (coronary artery disease)    a. Lexiscan Myoview 10/2016: no evidence of ischemia, EF 53%; b. 02/2020 Cath: LM nl, LAD 20p, 30m, LCX 20ost, OM1/2/3 nl, LPDA nl, LPL1/2 nl, LPAV nl, RCA small, nl.  . Obesity   . Obstructive sleep apnea   . Pulmonary hypertension ([place])   . Systolic dysfunction    a. TTE 10/2016: EF 50%, mild LVH, moderately dilated LA, moderate MR/TR, mild pulmonary hypertension  Family History: Family History  Problem Relation Age of Onset  . Dementia Mother   . Osteoporosis Mother   . Vascular Disease Mother   . COPD Father     Social History   Socioeconomic History  . Marital status: Married    Spouse name: [name]   . Number of children: 2  . Years of education: Not on file  . Highest education level: Not on file  Occupational History  . Not on file  Tobacco Use  . Smoking status: Never Smoker  . Smokeless tobacco: Never Used  Substance and Sexual Activity  . Alcohol use: No  . Drug use: No  . Sexual activity: Not on file  Other Topics Concern  . Not on file  Social History Narrative   3 grandchildren: 7 great grandkids.    Social Determinants of Health   Financial Resource Strain:   . Difficulty of Paying Living Expenses:   Food Insecurity:   . Worried About Charity fundraiser in the Last Year:   . Arboriculturist in  the Last Year:   Transportation Needs:   . Film/video editor (Medical):   Marland Kitchen Lack of Transportation (Non-Medical):   Physical Activity:   . Days of Exercise per Week:   . Minutes of Exercise per Session:   Stress:   . Feeling of Stress :   Social Connections:   . Frequency of Communication with Friends and Family:   . Frequency of Social Gatherings with Friends and Family:   . Attends Religious Services:   . Active Member of Clubs or Organizations:   . Attends Archivist Meetings:   Marland Kitchen Marital Status:   Intimate Partner Violence:   . Fear of Current or Ex-Partner:   . Emotionally Abused:   Marland Kitchen Physically Abused:   . Sexually Abused:       Review of Systems  Constitutional: Positive for fatigue. Negative for activity change, chills and unexpected weight change.  HENT: Negative for congestion, postnasal drip, rhinorrhea, sneezing and sore throat.   Respiratory: Positive for cough, shortness of breath and wheezing. Negative for chest tightness.        Doing well. Currently stable.   Cardiovascular: Positive for leg swelling. Negative for chest pain and palpitations.       Improving since retiring and not on her feet so often.   Gastrointestinal: Positive for diarrhea. Negative for abdominal pain, constipation, nausea and vomiting.       Abdominal cramping. Symptoms improving since starting on Creon.   Endocrine: Negative for cold intolerance, heat intolerance, polydipsia and polyuria.  Musculoskeletal: Positive for arthralgias and myalgias. Negative for back pain, joint swelling and neck pain.  Skin: Negative for rash.  Allergic/Immunologic: Positive for environmental allergies.  Neurological: Negative for dizziness, tremors, numbness and headaches.  Hematological: Negative for adenopathy. Does not bruise/bleed easily.  Psychiatric/Behavioral: Negative for behavioral problems (Depression), sleep disturbance and suicidal ideas. The patient is nervous/anxious.      Today's Vitals   04/24/20 1421  BP: (!) 148/63  Pulse: 68  Resp: 16  Temp: (!) 97.5 F (36.4 C)  SpO2: 96%  Weight: 272 lb (123.4 kg)  Height: 5\' 4"  (1.626 m)   Body mass index is 46.69 kg/m.  Physical Exam Vitals and nursing note reviewed.  Constitutional:      General: She is not in acute distress.    Appearance: Normal appearance. She is well-developed. She is obese. She is not diaphoretic.  HENT:  Head: Normocephalic and atraumatic.     Mouth/Throat:     Pharynx: No oropharyngeal exudate.  Eyes:     Pupils: Pupils are equal, round, and reactive to light.  Neck:     Thyroid: No thyromegaly.     Vascular: No JVD.     Trachea: No tracheal deviation.  Cardiovascular:     Rate and Rhythm: Normal rate. Rhythm irregular.     Heart sounds: Normal heart sounds. No murmur. No friction rub. No gallop.      Comments: Improved leg swelling of right lower leg.  Pulmonary:     Effort: Pulmonary effort is normal. No respiratory distress.     Breath sounds: Normal breath sounds. No wheezing or rales.  Chest:     Chest wall: No tenderness.  Abdominal:     General: Bowel sounds are normal. There is no distension.     Palpations: Abdomen is soft.     Tenderness: There is abdominal tenderness.     Comments: Abdominal tenderness improved since last being seen.  Musculoskeletal:        General: Normal range of motion.     Cervical back: Normal range of motion and neck supple.  Lymphadenopathy:     Cervical: No cervical adenopathy.  Skin:    General: Skin is warm and dry.  Neurological:     Mental Status: She is alert and oriented to person, place, and time.     Cranial Nerves: No cranial nerve deficit.  Psychiatric:        Attention and Perception: Attention and perception normal.        Mood and Affect: Affect normal. Mood is anxious.        Speech: Speech normal.        Behavior: Behavior normal. Behavior is cooperative.        Thought Content: Thought content  normal.        Cognition and Memory: Cognition normal.        Judgment: Judgment normal.   Assessment/Plan:  1. Severe persistent asthma without complication Currently stable. Continue inhalers and respiratory medication as prescribed   2. B12 deficiency b12 injection administered today.  - cyanocobalamin ((VITAMIN B-12)) injection 1,000 mcg  3. Atrial fibrillation with rapid ventricular response ([place]) Continue regular visits with cardiology as scheduled.   4. Chronic venous stasis dermatitis of right lower extremity Improving. Continue to monitor   5. Intestinal ischemia ([facility]) Recently started on creon to help with abdominal cramping and diarrhea. Continue regualr visits with GI as scheduled for continued evaluation and treatment.   6. OSA on CPAP Continue regular visits with pulmonology for CPAP management.    General Counseling: sokha cinco understanding of the findings of todays visit and agrees with plan of treatment. I have discussed any further diagnostic evaluation that may be needed or ordered today. We also reviewed her medications today. she has been encouraged to call the office with any questions or concerns that should arise related to todays visit.   This patient was seen by [place] with Dr [name] as a part of collaborative care agreement  Meds ordered this encounter  Medications  . cyanocobalamin ((VITAMIN B-12)) injection 1,000 mcg    Total time spent: 30 Minutes   Time spent includes review of chart, medications, test results, and follow up plan with the patient.      Dr [name] Internal medicine

## 2020-04-25 ENCOUNTER — Other Ambulatory Visit: Payer: Self-pay | Admitting: Cardiovascular Disease

## 2020-04-25 ENCOUNTER — Telehealth: Payer: Self-pay

## 2020-04-25 ENCOUNTER — Telehealth: Payer: Self-pay | Admitting: Cardiovascular Disease

## 2020-04-25 MED ORDER — APIXABAN 5 MG PO TABS: 5.0000 mg | ORAL_TABLET | Freq: Two times a day (BID) | ORAL | 1 refills | Status: DC

## 2020-04-25 MED ORDER — APIXABAN 5 MG PO TABS: 5.0000 mg | ORAL_TABLET | Freq: Two times a day (BID) | ORAL | 3 refills | Status: DC

## 2020-04-25 NOTE — Telephone Encounter (Signed)
Patient calling the office for samples of medication:   1.  What medication and dosage are you requesting samples for? Laura [name] 5 mg bid  2.  Are you currently out of this medication? Yes as of today

## 2020-04-25 NOTE — Telephone Encounter (Signed)
Please review for refill on Eliquis.  Thanks  

## 2020-04-25 NOTE — Telephone Encounter (Signed)
Spoke with patient to see if she had filled out patient assistance paperwork. She states that she has not done that. Recommended that we fill out application, have her come and sign forms, and I can provide some samples to last until we can see if they will assist with the cost. She was agreeable with this plan and application completed.   Medication Samples have been provided to the patient.  Drug name: Eliquis       Strength: 5 mg        Qty: 4 boxes  LOT: [id number]  Exp.Date: 3/23  Patient reports that she will come by office today to sign forms and pick these up. Application has been done and just needs signature. She verbalized understanding of our conversation, agreement with plan, and had no further questions at this time.

## 2020-04-25 NOTE — Telephone Encounter (Signed)
Pt's age 69, wt 123.4 kg, SCr 0.87, CrCl 117.21, last ov w/ TG 03/20/20. Refill sent is as requested.

## 2020-04-25 NOTE — Telephone Encounter (Signed)
Patient states that the CREON is working for her diarrhea. She states she is about out of samples. She would like a prescription called in for her but she states she is not going to be able to afford the medication if it is a lot of money. She states she wants to know what we recommend. She would like some more samples until something could be work out because she is leaving to go out of town on Saturday

## 2020-04-25 NOTE — Telephone Encounter (Signed)
Pt c/o medication issue:  1. Name of Medication: Eliquis  2. How are you currently taking this medication (dosage and times per day)? 5 mg bid  3. Are you having a reaction (difficulty breathing--STAT)? no  4. What is your medication issue? 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 Patient cannot afford the $400 copay. Patient is out of medication and needs help. [email or web address] is where patient gets other medications but will not help with eliquis.   Please advise patient as she is out of medication

## 2020-04-26 NOTE — Telephone Encounter (Signed)
Called pt regarding the Creon.  Unable to contact, LVM to return call

## 2020-04-27 ENCOUNTER — Other Ambulatory Visit: Payer: Self-pay

## 2020-04-27 MED ORDER — PANCRELIPASE (LIP-PROT-AMYL) 36000-114000 UNITS PO CPEP: ORAL_CAPSULE | ORAL | 5 refills | Status: DC

## 2020-04-27 NOTE — Telephone Encounter (Signed)
Spoke with pt and informed her that we do have samples of Creon available at our office. I explained to pt that we will send the prescription to her preferred pharmacy pt then stated that she'll contact the pharmacy to find out her copay and if it's too expensive pt will contact our office to let us know so that we can find a financial assistance program.

## 2020-05-01 ENCOUNTER — Ambulatory Visit: Payer: BC Managed Care – PPO | Admitting: Nurse Practitioner

## 2020-05-04 ENCOUNTER — Telehealth: Payer: Self-pay

## 2020-05-04 NOTE — Telephone Encounter (Signed)
Confirmed appointment on 05/08/2020 and screened for covid. klh 

## 2020-05-08 ENCOUNTER — Ambulatory Visit: Payer: BC Managed Care – PPO | Admitting: Internal Medicine

## 2020-05-10 ENCOUNTER — Other Ambulatory Visit: Payer: Self-pay

## 2020-05-10 ENCOUNTER — Ambulatory Visit (INDEPENDENT_AMBULATORY_CARE_PROVIDER_SITE_OTHER): Payer: Medicare Other | Admitting: Nurse Practitioner

## 2020-05-10 ENCOUNTER — Telehealth: Payer: Self-pay | Admitting: Family

## 2020-05-10 ENCOUNTER — Encounter: Payer: Self-pay | Admitting: Nurse Practitioner

## 2020-05-10 VITALS — BP 126/72 | HR 101 | Ht 64.0 in | Wt 268.0 lb

## 2020-05-10 DIAGNOSIS — I2510 Atherosclerotic heart disease of native coronary artery without angina pectoris: Secondary | ICD-10-CM | POA: Diagnosis not present

## 2020-05-10 DIAGNOSIS — I5020 Unspecified systolic (congestive) heart failure: Secondary | ICD-10-CM | POA: Diagnosis not present

## 2020-05-10 DIAGNOSIS — I428 Other cardiomyopathies: Secondary | ICD-10-CM

## 2020-05-10 DIAGNOSIS — I10 Essential (primary) hypertension: Secondary | ICD-10-CM

## 2020-05-10 DIAGNOSIS — E782 Mixed hyperlipidemia: Secondary | ICD-10-CM

## 2020-05-10 DIAGNOSIS — I4821 Permanent atrial fibrillation: Secondary | ICD-10-CM | POA: Diagnosis not present

## 2020-05-10 NOTE — Progress Notes (Signed)
Office Visit    Patient Name: Laura [name] Date of Encounter: 05/10/2020  Primary Care Provider:  [name], MD Primary Cardiologist:  Kathlyn Sacramento, MD  Chief Complaint    69 year old female with a history of permanent atrial fibrillation, nonobstructive CAD, HFmrEF (EF 40-45%), NICM, hypertension, hyperlipidemia, obesity, sleep apnea, GERD, and COPD, who presents for follow-up of AFib.  Past Medical History    Past Medical History:  Diagnosis Date  . Asthma   . Chronic atrial fibrillation ([place])    a. diagnosed in 09/2016; b. failed flecainide and propafenone due to LE swelling and SOB, could not afford Multaq; c. CHADS2VASc => 5 (CHF, HTN, age x 1, nonobs CAD, female)--> Eliquis  . COPD (chronic obstructive pulmonary disease) ([place])   . GERD (gastroesophageal reflux disease)   . HFmrEF (heart failure with mid-range ejection fraction) ([place])    a. 12/2019 Echo: EF 40-45%.  . Hyperlipidemia   . Hypertension   . NICM (nonischemic cardiomyopathy) ([place])    a. 12/2019 Echo: EF 40-45%, glob HK, mildly reduced RV fxn, sev dil LA. *HR 130 (afib) during study.  . Nonobstructive CAD (coronary artery disease)    a. Lexiscan Myoview 10/2016: no evidence of ischemia, EF 53%; b. 02/2020 Cath: LM nl, LAD 20p, 30m, LCX 20ost, OM1/2/3 nl, LPDA nl, LPL1/2 nl, LPAV nl, RCA small, nl.  . Obesity   . Obstructive sleep apnea   . Pulmonary hypertension ([place])   . Systolic dysfunction    a. TTE 10/2016: EF 50%, mild LVH, moderately dilated LA, moderate MR/TR, mild pulmonary hypertension   Past Surgical History:  Procedure Laterality Date  . BOWEL RESECTION  09/11/2019   Procedure: SMALL BOWEL RESECTION;  Surgeon: Herbert Pun, MD;  Location: [facility] ORS;  Service: General;;  . CARDIAC CATHETERIZATION    . cataract surgery    . COLONOSCOPY WITH PROPOFOL N/A 03/19/2020   Procedure: COLONOSCOPY WITH PROPOFOL;  Surgeon: [name], MD;  Location[facility];  Service:  Gastroenterology;  Laterality: N/A;  . CORONARY ANGIOPLASTY    . INCISION AND DRAINAGE ABSCESS Right 06/29/2016   Procedure: INCISION AND DRAINAGE ABSCESS;  Surgeon: Florene Glen, MD;  Location: [facility] ORS;  Service: General;  Laterality: Right;  . INCISION AND DRAINAGE OF WOUND Left 06/29/2016   Procedure: IRRIGATION AND DEBRIDEMENT WOUND;  Surgeon: Florene Glen, MD;  Location: [facility] ORS;  Service: General;  Laterality: Left;  . LAPAROSCOPIC RIGHT COLECTOMY  09/11/2019   Procedure: RIGHT COLECTOMY;  Surgeon: Herbert Pun, MD;  Location: [facility] ORS;  Service: General;;  . LAPAROSCOPY N/A 09/11/2019   Procedure: LAPAROSCOPY DIAGNOSTIC;  Surgeon: Herbert Pun, MD;  Location: [facility] ORS;  Service: General;  Laterality: N/A;  . LAPAROTOMY N/A 09/13/2019   Procedure: REOPENING OF RECENT LAPAROTOMYANASTOMOSIS OF BOWEL;  Surgeon: Herbert Pun, MD;  Location: [facility] ORS;  Service: General;  Laterality: N/A;  . RIGHT/LEFT HEART CATH AND CORONARY ANGIOGRAPHY Bilateral 02/27/2020   Procedure: RIGHT/LEFT HEART CATH AND CORONARY ANGIOGRAPHY;  Surgeon: Wellington Hampshire, MD;  Location: [place] CV LAB;  Service: Cardiovascular;  Laterality: Bilateral;  . VISCERAL ANGIOGRAPHY N/A 09/12/2019   Procedure: VISCERAL ANGIOGRAPHY;  Surgeon: Algernon Huxley, MD;  Location: [place] CV LAB;  Service: Cardiovascular;  Laterality: N/A;    Allergies  Allergies  Allergen Reactions  . Flecainide Shortness Of Breath  . Metoprolol Shortness Of Breath and Swelling  . Propafenone Shortness Of Breath and Swelling  . Rivaroxaban Other (See Comments)  History of Present Illness    69 year old female with above past medical history including permanent atrial fibrillation, nonobstructive CAD, heart failure with midrange EF, nonischemic cardiomyopathy, hypertension, hyperlipidemia, obesity, sleep apnea, GERD, and COPD.  Atrial fibrillation was diagnosed in October 2017 and she has previously failed  flecainide and propafenone.  She is also had trouble with metoprolol and rivaroxaban and as a result, has been rate controlled with diltiazem and digoxin with Eliquis for anticoagulation.  Echocardiogram in January of this year revealed new LV dysfunction with an EF of 40-45%.  It is notable, that heart rate was elevated in the 130s at the time of her echo and digoxin therapy was subsequently added.  Subsequent CT of the chest showed dilated pulmonary artery suggestive of pulmonary hypertension.  She was seen in clinic in early March and reported chest pain.  In the setting of new LV dysfunction and chest pain, diagnostic catheterization was performed revealed minimal, nonobstructive CAD with a wedge of 24 and PA of 43/24 (30).  Was felt the pulmonary hypertension was related to left-sided failure and she was placed on furosemide 20 mg daily.  At follow-up office visit on March 30 renal function electrolytes were stable and furosemide therapy was continued.  She also had a lipid panel with an LDL of 103 and Crestor 20 mg daily was added in the setting of nonobstructive disease.  Since her last visit, she has done well.  She retired 2 weeks ago and feels that that lifted a major stress off of her.  She continues to tolerate Lasix well and has not had any significant dyspnea or lower extremity swelling.  She denies chest pain, palpitations, PND, orthopnea, dizziness, syncope, or early satiety.  She has tolerated Crestor well but is not fasting today.  Home Medications    Prior to Admission medications   Medication Sig Start Date End Date Taking? Authorizing Provider  albuterol (VENTOLIN HFA) 108 (90 Base) MCG/ACT inhaler Inhale 2 puffs into the lungs every 4 (four) hours as needed for wheezing or shortness of breath. 03/28/20   Ronnell Freshwater, NP  ALPRAZolam (XANAX) 0.25 MG tablet Take 1 tablet (0.25 mg total) by mouth 2 (two) times daily. 01/31/20   Ronnell Freshwater, NP  apixaban (ELIQUIS) 5 MG TABS tablet  Take 1 tablet (5 mg total) by mouth 2 (two) times daily. 04/25/20   Minna Merritts, MD  benzonatate (TESSALON) 100 MG capsule Take 1 capsule (100 mg total) by mouth 2 (two) times daily as needed for cough. 04/03/20   Scarboro, Audie Clear, NP  budesonide (PULMICORT) 0.25 MG/2ML nebulizer solution Take 2 mLs (0.25 mg total) by nebulization 2 (two) times daily. 02/02/20   Kendell Bane, NP  cholestyramine Lucrezia Starch) 4 g packet Take 1 packet (4 g total) by mouth 2 (two) times daily. 01/25/20 07/23/20  [name], MD  cyanocobalamin (,VITAMIN B-12,) 1000 MCG/ML injection Inject 1,000 mcg into the muscle every 30 (thirty) days.    [provider]  digoxin (LANOXIN) 0.125 MG tablet Take 1 tablet (0.125 mg total) by mouth daily. 03/20/20   Loel Dubonnet, NP  diltiazem (CARDIZEM CD) 360 MG 24 hr capsule Take 1 capsule (360 mg total) by mouth daily. 03/20/20   Loel Dubonnet, NP  EPINEPHrine 0.3 mg/0.3 mL IJ SOAJ injection Inject 0.3 mg into the muscle as needed for anaphylaxis.     [provider]  furosemide (LASIX) 20 MG tablet Take 1 tablet (20 mg total) by mouth  daily. 04/11/20 04/11/21  Ronnell Freshwater, NP  ipratropium-albuterol (DUONEB) 0.5-2.5 (3) MG/3ML SOLN Take 3 mLs by nebulization every 4 (four) hours as needed. 03/28/20   Ronnell Freshwater, NP  levalbuterol (XOPENEX) 0.31 MG/3ML nebulizer solution Take 3 mLs by nebulization every 4 (four) hours as needed. For wheezing/ and or shortness of breath. 02/05/16   [provider]  lipase/protease/amylase (CREON) 36000 UNITS CPEP capsule Take 2 capsules with each meal and 1 with a snack 04/27/20   [name], MD  losartan (COZAAR) 25 MG tablet Take 1 tablet (25 mg total) by mouth daily. 03/28/20   Ronnell Freshwater, NP  montelukast (SINGULAIR) 10 MG tablet Take 1 tablet (10 mg total) by mouth daily. 03/28/20   Ronnell Freshwater, NP  Multiple Vitamin (MULTIVITAMIN WITH MINERALS) TABS tablet Take 1 tablet by mouth daily.    [provider]  ondansetron (ZOFRAN) 4 MG tablet Take 1 tablet (4 mg total) by mouth every 8 (eight) hours as needed for nausea or vomiting. 09/30/19   Ronnell Freshwater, NP  predniSONE (DELTASONE) 10 MG tablet Take 1 tablet (10 mg total) by mouth daily with breakfast. 03/28/20   Ronnell Freshwater, NP  Probiotic Product (PROBIOTIC-10) CAPS Take 1 capsule by mouth daily.     [provider]  rosuvastatin (CRESTOR) 20 MG tablet Take 1 tablet (20 mg total) by mouth daily. 03/21/20 06/19/20  Loel Dubonnet, NP  spironolactone (ALDACTONE) 25 MG tablet Take 1 tablet (25 mg total) by mouth 2 (two) times daily. 03/28/20   Ronnell Freshwater, NP  theophylline (THEODUR) 300 MG 12 hr tablet Take 1 tablet (300 mg total) by mouth 2 (two) times daily. 03/28/20   Ronnell Freshwater, NP    Review of Systems    Overall stable since last visit.  Recent left leg pain in the setting of sciatica and she is currently on a prednisone taper.  She denies chest pain, dyspnea, palpitations, PND, orthopnea, dizziness, syncope,or early satiety.  She has chronic, stable right ankle swelling which is much improved since being placed on low-dose furosemide.  All other systems reviewed and are otherwise negative except as noted above.  Physical Exam    VS:  BP 126/72 (BP Location: Right Arm, Patient Position: Sitting, Cuff Size: Normal)   Pulse (!) 101   Ht 5\' 4"  (1.626 m)   Wt 268 lb (121.6 kg)   LMP  (LMP Unknown)   SpO2 98%   BMI 46.00 kg/m  , BMI Body mass index is 46 kg/m. GEN: Well nourished, well developed, in no acute distress. HEENT: normal. Neck: Supple, no JVD, carotid bruits, or masses. Cardiac: Irregularly, irregular, distant, no murmurs, rubs, or gallops. No clubbing, cyanosis.  Trace right ankle edema with multiple varicosities noted.  Radials/PT 2+ and equal bilaterally.  Respiratory:  Respirations regular and unlabored, clear to auscultation bilaterally. GI: Soft, nontender, nondistended, BS + x  4. MS: no deformity or atrophy. Skin: warm and dry, no rash. Neuro:  Strength and sensation are intact. Psych: Normal affect.  Accessory Clinical Findings    ECG personally reviewed by me today -atrial fibrillation, 101, left axis deviation, PVC nonspecific ST and T changes - no acute changes.  Lab Results  Component Value Date   WBC 9.6 02/21/2020   HGB 13.5 02/21/2020   HCT 42.5 02/21/2020   MCV 89.5 02/21/2020   PLT 315 02/21/2020   Lab Results  Component Value Date   CREATININE 0.87 03/20/2020  BUN 14 03/20/2020   NA 144 03/20/2020   K 4.2 03/20/2020   CL 102 03/20/2020   CO2 24 03/20/2020   Lab Results  Component Value Date   ALT 17 09/12/2019   AST 21 09/12/2019   ALKPHOS 55 09/12/2019   BILITOT 1.0 09/12/2019   Lab Results  Component Value Date   CHOL 196 03/20/2020   HDL 68 03/20/2020   LDLCALC 103 (H) 03/20/2020   TRIG 142 03/20/2020   CHOLHDL 2.9 03/20/2020    Lab Results  Component Value Date   HGBA1C 5.9 (H) 01/02/2020   Assessment & Plan    1.  Permanent atrial fibrillation: CHA2DS2VASc equals 5.  Rate initially 101 on arrival today but she did slow down into the 90s on follow-up.  Currently on a prednisone taper which may be driving rate slightly.  She has been doing well since her last visit 2 months ago and was checking her heart rates with a pulse oximeter but has not done so in 2 weeks.  She says she was trending in the 90s previously.  I did encourage her to continue to periodically check her heart rate at home with a goal of less than 100.  She remains on digoxin and Cardizem therapy.  She is anticoagulated with Eliquis and does have concerns about cost ever since moving on to Medicare.  She may need to switch to warfarin and will let us know.  2.  Nonischemic cardiomyopathy/chronic heart failure with midrange ejection fraction: EF of 40 to 45% in the setting of rapid atrial fibrillation back in January.  Right and left heart catheterization in  March showed minimal nonobstructive CAD with elevated right heart pressures along with a wedge of 24.  She has since been on furosemide 20 mg daily and has done very well with improved activity tolerance and reduced edema.  Renal function electrolytes were stable at office visit on March 30.  I will arrange for follow-up basic metabolic panel.  She remains on ARB and spironolactone therapy.  No beta-blocker in the setting of prior intolerance.  It is notable that she is on diltiazem therapy however, this choice was made in the setting of rapid rates likely driving LV dysfunction.  3.  Nonobstructive coronary artery disease: Status post recent catheterization.  Denies chest pain or dyspnea.  She remains on statin therapy, which was initiated post catheterization.  No aspirin in the setting of chronic Eliquis.  4.  Essential hypertension: Stable on ARB, spironolactone, and diuretic therapy.  5.  Hyperlipidemia: LDL of 103 in March.  She has tolerated Crestor therapy well.  She is not fasting today and therefore come back next week for fasting lipids and complete metabolic panel.  6.  Obstructive sleep apnea: Using CPAP.  7.  Sciatica: Currently on a prednisone taper and being managed by her primary care provider.  8.  Disposition: Follow-up fasting lipids and complete metabolic panel next week.  Follow-up in clinic in 3 months or sooner if necessary.  Murray Hodgkins, NP 05/10/2020, 10:45 AM

## 2020-05-10 NOTE — Patient Instructions (Signed)
Medication Instructions:  Your physician recommends that you continue on your current medications as directed. Please refer to the Current Medication list given to you today.  *If you need a refill on your cardiac medications before your next appointment, please call your pharmacy*   Lab Work: Your physician recommends that you return for lab work in: [place] at the medical mall. You will need to be fasting.  No appt is needed. Hours are M-F 7AM- 6 PM.  If you have labs (blood work) drawn today and your tests are completely normal, you will receive your results only by: Marland Kitchen MyChart Message (if you have MyChart) OR . A paper copy in the mail If you have any lab test that is abnormal or we need to change your treatment, we will call you to review the results.   Testing/Procedures: None ordered    Follow-Up: At [facility], you and your health needs are our priority.  As part of our continuing mission to provide you with exceptional heart care, we have created designated Provider Care Teams.  These Care Teams include your primary Cardiologist (physician) and Advanced Practice Providers (APPs -  Physician Assistants and Nurse Practitioners) who all work together to provide you with the care you need, when you need it.  We recommend signing up for the patient portal called "MyChart".  Sign up information is provided on this After Visit Summary.  MyChart is used to connect with patients for Virtual Visits (Telemedicine).  Patients are able to view lab/test results, encounter notes, upcoming appointments, etc.  Non-urgent messages can be sent to your provider as well.   To learn more about what you can do with MyChart, go to [email or web address].    Your next appointment:   3 month(s)  The format for your next appointment:   In Person  Provider:    You may see Kathlyn Sacramento, MD, Laurann Montana, NP or Murray Hodgkins, NP  Other Instructions

## 2020-05-10 NOTE — Telephone Encounter (Signed)
Record ID [id number] For Eliquis PA

## 2020-05-11 NOTE — Telephone Encounter (Signed)
Per Thurmond Butts at [facility], Eliquis is covered by patient's Medicare part D plan however her copay is $253 for a 30 day supply. No PA needed at this time. Previous documentation by Romualdo Bolk, RN states patient is applying for patient assistance through [facility].

## 2020-05-16 ENCOUNTER — Other Ambulatory Visit
Admission: RE | Admit: 2020-05-16 | Discharge: 2020-05-16 | Disposition: A | Discharge status: Home / Self Care | Payer: Medicare Other | Source: Ambulatory Visit | Attending: Nurse Practitioner | Admitting: Nurse Practitioner

## 2020-05-16 ENCOUNTER — Telehealth: Payer: Self-pay

## 2020-05-16 DIAGNOSIS — I4821 Permanent atrial fibrillation: Secondary | ICD-10-CM | POA: Diagnosis present

## 2020-05-16 DIAGNOSIS — E785 Hyperlipidemia, unspecified: Secondary | ICD-10-CM | POA: Insufficient documentation

## 2020-05-16 LAB — COMPREHENSIVE METABOLIC PANEL
ALT: 19 U/L (ref 0–44)
AST: 20 U/L (ref 15–41)
Albumin: 3.9 g/dL (ref 3.5–5.0)
Alkaline Phosphatase: 56 U/L (ref 38–126)
Anion gap: 9 (ref 5–15)
BUN: 14 mg/dL (ref 8–23)
CO2: 27 mmol/L (ref 22–32)
Calcium: 9 mg/dL (ref 8.9–10.3)
Chloride: 101 mmol/L (ref 98–111)
Creatinine, Ser: 0.71 mg/dL (ref 0.44–1.00)
GFR calc Af Amer: >60 mL/min (ref >60)
GFR calc non Af Amer: >60 mL/min (ref >60)
Glucose, Bld: 119 mg/dL — ABNORMAL HIGH (ref 70–99)
Potassium: 4.3 mmol/L (ref 3.5–5.1)
Sodium: 137 mmol/L (ref 135–145)
Total Bilirubin: 1 mg/dL (ref 0.3–1.2)
Total Protein: 6.7 g/dL (ref 6.5–8.1)

## 2020-05-16 LAB — LIPID PANEL
Cholesterol: 136 mg/dL (ref 0–200)
HDL: 66 mg/dL (ref >40)
LDL Cholesterol: 41 mg/dL (ref 0–99)
Total CHOL/HDL Ratio: 2.1 RATIO
Triglycerides: 147 mg/dL (ref <150)
VLDL: 29 mg/dL (ref 0–40)

## 2020-05-16 NOTE — Telephone Encounter (Signed)
Phone note started in error.

## 2020-05-17 ENCOUNTER — Ambulatory Visit (INDEPENDENT_AMBULATORY_CARE_PROVIDER_SITE_OTHER): Payer: Medicare Other | Admitting: Nurse Practitioner

## 2020-05-17 ENCOUNTER — Other Ambulatory Visit: Payer: Self-pay

## 2020-05-17 ENCOUNTER — Encounter: Payer: Self-pay | Admitting: Nurse Practitioner

## 2020-05-17 VITALS — BP 139/59 | HR 66 | Temp 97.4°F | Resp 16 | Ht 64.0 in | Wt 266.0 lb

## 2020-05-17 DIAGNOSIS — M5441 Lumbago with sciatica, right side: Secondary | ICD-10-CM | POA: Diagnosis not present

## 2020-05-17 MED ORDER — PREDNISONE 10 MG (48) PO TBPK: ORAL_TABLET | ORAL | 0 refills | Status: DC

## 2020-05-17 NOTE — Progress Notes (Signed)
[facility] [place], [place] [address]  Internal MEDICINE  Office Visit Note  Patient Name: Laura [name]  [id number]  [id number]  Date of Service: 05/21/2020   Pt is here for a sick visit.   Chief Complaint  Patient presents with  . Sciatica    worse on the right side and it feels like it is also on the left side, going on since last week      The patient is here for acute visit. She states that she is having severe right sided lower back pain which radiates to right hip and down the right leg. Sometimes, the pain is so bad, sh cannot stand up straight for several minutes.  This has happened in the past. Had to do a double round of prednisone to get this to go away.        Current Medication:  Outpatient Encounter Medications as of 05/17/2020  Medication Sig  . albuterol (VENTOLIN HFA) 108 (90 Base) MCG/ACT inhaler Inhale 2 puffs into the lungs every 4 (four) hours as needed for wheezing or shortness of breath.  . ALPRAZolam (XANAX) 0.25 MG tablet Take 1 tablet (0.25 mg total) by mouth 2 (two) times daily.  Marland Kitchen apixaban (ELIQUIS) 5 MG TABS tablet Take 1 tablet (5 mg total) by mouth 2 (two) times daily.  . benzonatate (TESSALON) 100 MG capsule Take 1 capsule (100 mg total) by mouth 2 (two) times daily as needed for cough.  . budesonide (PULMICORT) 0.25 MG/2ML nebulizer solution Take 2 mLs (0.25 mg total) by nebulization 2 (two) times daily.  . cholestyramine (QUESTRAN) 4 g packet Take 1 packet (4 g total) by mouth 2 (two) times daily.  . cyanocobalamin (,VITAMIN B-12,) 1000 MCG/ML injection Inject 1,000 mcg into the muscle every 30 (thirty) days.  . digoxin (LANOXIN) 0.125 MG tablet Take 1 tablet (0.125 mg total) by mouth daily.  Marland Kitchen diltiazem (CARDIZEM CD) 360 MG 24 hr capsule Take 1 capsule (360 mg total) by mouth daily.  Marland Kitchen EPINEPHrine 0.3 mg/0.3 mL IJ SOAJ injection Inject 0.3 mg into the muscle as needed for anaphylaxis.   . furosemide (LASIX) 20  MG tablet Take 1 tablet (20 mg total) by mouth daily.  Marland Kitchen ipratropium-albuterol (DUONEB) 0.5-2.5 (3) MG/3ML SOLN Take 3 mLs by nebulization every 4 (four) hours as needed.  . levalbuterol (XOPENEX) 0.31 MG/3ML nebulizer solution Take 3 mLs by nebulization every 4 (four) hours as needed. For wheezing/ and or shortness of breath.  . lipase/protease/amylase (CREON) 36000 UNITS CPEP capsule Take 2 capsules with each meal and 1 with a snack  . losartan (COZAAR) 25 MG tablet Take 1 tablet (25 mg total) by mouth daily.  . montelukast (SINGULAIR) 10 MG tablet Take 1 tablet (10 mg total) by mouth daily.  . Multiple Vitamin (MULTIVITAMIN WITH MINERALS) TABS tablet Take 1 tablet by mouth daily.  . ondansetron (ZOFRAN) 4 MG tablet Take 1 tablet (4 mg total) by mouth every 8 (eight) hours as needed for nausea or vomiting.  . predniSONE (DELTASONE) 10 MG tablet Take 1 tablet (10 mg total) by mouth daily with breakfast.  . Probiotic Product (PROBIOTIC-10) CAPS Take 1 capsule by mouth daily.   . rosuvastatin (CRESTOR) 20 MG tablet Take 1 tablet (20 mg total) by mouth daily.  Marland Kitchen spironolactone (ALDACTONE) 25 MG tablet Take 1 tablet (25 mg total) by mouth 2 (two) times daily.  . theophylline (THEODUR) 300 MG 12 hr tablet Take 1 tablet (300 mg total)  by mouth 2 (two) times daily.  . predniSONE (STERAPRED UNI-PAK 48 TAB) 10 MG (48) TBPK tablet 12 day taper - take by mouth as directed for 12 days   Facility-Administered Encounter Medications as of 05/17/2020  Medication Note  . cyanocobalamin ((VITAMIN B-12)) injection 1,000 mcg 02/27/2020: Due 02/28/2020      Medical History: Past Medical History:  Diagnosis Date  . Asthma   . Chronic atrial fibrillation ([place])    a. diagnosed in 09/2016; b. failed flecainide and propafenone due to LE swelling and SOB, could not afford Multaq; c. CHADS2VASc => 5 (CHF, HTN, age x 1, nonobs CAD, female)--> Eliquis  . COPD (chronic obstructive pulmonary disease) ([place])   . GERD  (gastroesophageal reflux disease)   . HFmrEF (heart failure with mid-range ejection fraction) ([place])    a. 12/2019 Echo: EF 40-45%.  . Hyperlipidemia   . Hypertension   . NICM (nonischemic cardiomyopathy) ([place])    a. 12/2019 Echo: EF 40-45%, glob HK, mildly reduced RV fxn, sev dil LA. *HR 130 (afib) during study.  . Nonobstructive CAD (coronary artery disease)    a. Lexiscan Myoview 10/2016: no evidence of ischemia, EF 53%; b. 02/2020 Cath: LM nl, LAD 20p, 66m, LCX 20ost, OM1/2/3 nl, LPDA nl, LPL1/2 nl, LPAV nl, RCA small, nl.  . Obesity   . Obstructive sleep apnea   . Pulmonary hypertension ([place])   . Systolic dysfunction    a. TTE 10/2016: EF 50%, mild LVH, moderately dilated LA, moderate MR/TR, mild pulmonary hypertension     Today's Vitals   05/17/20 1408  BP: (!) 139/59  Pulse: 66  Resp: 16  Temp: (!) 97.4 F (36.3 C)  SpO2: 98%  Weight: 266 lb (120.7 kg)  Height: 5\' 4"  (1.626 m)   Body mass index is 45.66 kg/m.  Review of Systems  Constitutional: Positive for activity change. Negative for chills, fatigue and unexpected weight change.       The patient is unable to stand for long periods of time due to severe sciatic nerve pain.   HENT: Negative for congestion, postnasal drip, rhinorrhea, sneezing and sore throat.   Respiratory: Negative for cough, chest tightness and shortness of breath.   Cardiovascular: Negative for chest pain and palpitations.  Gastrointestinal: Negative for abdominal pain, constipation, diarrhea, nausea and vomiting.  Musculoskeletal: Positive for arthralgias, back pain and myalgias. Negative for joint swelling and neck pain.       Lower back pain which radiates into the right hip and down the right leg. Hurting to sit for long periods of time. Hurts to stand still and to walk.   Skin: Negative for rash.  Allergic/Immunologic: Positive for environmental allergies.  Neurological: Negative for dizziness, tremors, numbness and headaches.  Hematological:  Negative for adenopathy. Does not bruise/bleed easily.  Psychiatric/Behavioral: Negative for behavioral problems (Depression), sleep disturbance and suicidal ideas. The patient is not nervous/anxious.     Physical Exam Vitals and nursing note reviewed.  Constitutional:      General: She is not in acute distress.    Appearance: Normal appearance. She is well-developed. She is obese. She is not diaphoretic.     Comments: She appears to be in pain.  HENT:     Head: Normocephalic and atraumatic.     Mouth/Throat:     Pharynx: No oropharyngeal exudate.  Eyes:     Pupils: Pupils are equal, round, and reactive to light.  Neck:     Thyroid: No thyromegaly.     Vascular: No  JVD.     Trachea: No tracheal deviation.  Cardiovascular:     Rate and Rhythm: Normal rate. Rhythm irregular.     Heart sounds: Normal heart sounds. No murmur. No friction rub. No gallop.   Pulmonary:     Effort: Pulmonary effort is normal. No respiratory distress.     Breath sounds: Normal breath sounds. No wheezing or rales.  Chest:     Chest wall: No tenderness.  Abdominal:     Palpations: Abdomen is soft.  Musculoskeletal:        General: Normal range of motion.     Cervical back: Normal range of motion and neck supple.     Comments: There is moderate to severe lower back pain. This os worse when sitting for long periods of time. Bending and twisting at the waist also increases pain. No visible or palpable abnormalities are noted at this time.   Lymphadenopathy:     Cervical: No cervical adenopathy.  Skin:    General: Skin is warm and dry.  Neurological:     Mental Status: She is alert and oriented to person, place, and time.     Cranial Nerves: No cranial nerve deficit.  Psychiatric:        Mood and Affect: Mood normal.        Behavior: Behavior normal.        Thought Content: Thought content normal.        Judgment: Judgment normal.   Assessment/Plan: 1. Acute right-sided low back pain with right-sided  sciatica Start prednisone taper. Take as directed for 12 days. Rest. Recommend she apply heat to the lower back as needed to help with pain reduction.  - predniSONE (STERAPRED UNI-PAK 48 TAB) 10 MG (48) TBPK tablet; 12 day taper - take by mouth as directed for 12 days  Dispense: 48 tablet; Refill: 0  General Counseling: Dontia verbalizes understanding of the findings of todays visit and agrees with plan of treatment. I have discussed any further diagnostic evaluation that may be needed or ordered today. We also reviewed her medications today. she has been encouraged to call the office with any questions or concerns that should arise related to todays visit.    Counseling:   This patient was seen by [place] with Dr [name] as a part of collaborative care agreement  Meds ordered this encounter  Medications  . predniSONE (STERAPRED UNI-PAK 48 TAB) 10 MG (48) TBPK tablet    Sig: 12 day taper - take by mouth as directed for 12 days    Dispense:  48 tablet    Refill:  0    Patient to hold daily dose of prednisone while taking prednisone taper.    Order Specific Question:   Supervising Provider    Answer:   [name] [id number]    Time spent: 20 Minutes

## 2020-05-21 DIAGNOSIS — M5441 Lumbago with sciatica, right side: Secondary | ICD-10-CM | POA: Insufficient documentation

## 2020-05-24 NOTE — Telephone Encounter (Signed)
Faxed received from [facility] was denied coverage for Eliquis due to: -Financial Criteria not Met -Medicare, Insufficient TrOOP/OOP.  Any questions or pursue an appeal call [employer] [phone].

## 2020-05-25 ENCOUNTER — Other Ambulatory Visit: Payer: Self-pay

## 2020-05-25 ENCOUNTER — Ambulatory Visit (INDEPENDENT_AMBULATORY_CARE_PROVIDER_SITE_OTHER): Payer: Medicare Other

## 2020-05-25 DIAGNOSIS — E538 Deficiency of other specified B group vitamins: Secondary | ICD-10-CM

## 2020-05-25 MED ORDER — CYANOCOBALAMIN 1000 MCG/ML IJ SOLN
1000.0000 ug | Freq: Once | INTRAMUSCULAR | Status: AC
  Administered 2020-05-25: 1000 ug via INTRAMUSCULAR

## 2020-05-29 ENCOUNTER — Telehealth: Payer: Self-pay

## 2020-05-29 NOTE — Telephone Encounter (Signed)
Confirmed patient appt for cpap download

## 2020-05-30 ENCOUNTER — Other Ambulatory Visit: Payer: Self-pay

## 2020-05-30 ENCOUNTER — Ambulatory Visit (INDEPENDENT_AMBULATORY_CARE_PROVIDER_SITE_OTHER): Payer: Medicare Other

## 2020-05-30 DIAGNOSIS — G4733 Obstructive sleep apnea (adult) (pediatric): Secondary | ICD-10-CM

## 2020-05-30 NOTE — Progress Notes (Signed)
95 percentile pressure 14   95th percentile leak 21.7   apnea index 0.1 /hr  apnea-hypopnea index  0.7 /hr   total days used  >4 hr 88 days  total days used <4 hr 2 days  Total compliance 98 percent  She is doing great no problems or questions at this time

## 2020-05-31 ENCOUNTER — Telehealth (INDEPENDENT_AMBULATORY_CARE_PROVIDER_SITE_OTHER): Payer: Medicare Other | Admitting: Gastroenterology

## 2020-05-31 DIAGNOSIS — I2510 Atherosclerotic heart disease of native coronary artery without angina pectoris: Secondary | ICD-10-CM | POA: Diagnosis not present

## 2020-05-31 DIAGNOSIS — K580 Irritable bowel syndrome with diarrhea: Secondary | ICD-10-CM | POA: Diagnosis not present

## 2020-05-31 NOTE — Progress Notes (Signed)
[name] , MD [address]  [place]  [place],  [address]  Main: [phone]  Fax: [phone]   Primary Care Physician: [name], MD  Virtual Visit via Video Note  I connected with patient on 05/31/20 at  1:30 PM EDT by video and verified that I am speaking with the correct person using two identifiers.   I discussed the limitations, risks, security and privacy concerns of performing an evaluation and management service by video  and the availability of in person appointments. I also discussed with the patient that there may be a patient responsible charge related to this service. The patient expressed understanding and agreed to proceed.  Location of Patient: Home Location of Provider: Home Persons involved: Patient and provider only   History of Present Illness:   Follow-up for IBS diarrhea  HPI: Laura [name] is a 69 y.o. female  Summary of history :  She was initially referred and seen on 01/25/2020 for diarrhea.She has previously been a patient of [facility] gastroenterology. She carries a diagnosis of collagenous colitis noted in 2017. Looking back at colon biopsy results from 2013 the biopsy resultsare notvery specific for collagenous colitis.She was hospitalized in November 2020 and underwent resection of the small bowel due to ischemia. During the surgery she underwent ileum and right colon resection. Had a second look surgery.  09/18/2019: CT scan of the abdomen shows status post distal small bowel resection with right hemicolectomy and enterocolonic anastomosis.  01/02/2020: Ferritin 25. B12 and folate normal. Hemoglobin 10.7 g in January 2021 with an MCV of 85.  Shesays that prior to the surgery she was having features of constipation alternating with diarrhea. Since the surgery she has been having only diarrhea up to 10-15 times a day. At times she has accidents and she has to wear a diaper to prevent soiling her undergarments. She  denies any use of any artificial sugars, NSAIDs, Metformin. She wishes to transfer care to our care she was on a PPI which has been stopped.  01/27/2020: Fecal calprotectin normal, celiac serology negative. Stool for C. difficile toxin was negative. GI PCR of her stool showed enteroaggregative E. coli positive. Commenced on azithromycin.  03/04/2020: Stool PCR- negative 03/19/2020: Biopsies taken of the terminal ileum as well as colon.  No evidence of any form of colitis or inflammation.  Interval history4/22/2021-05/31/2020  At her last visit she was taking Questran and Imodium with no significant improvement in her diarrhea.  Added Creon which initially worked and then stopped working.  Increase the dose to 3 pills for meals.  Which seems to have still not work.  Lots of gas bloating and diarrhea.   Current Outpatient Medications  Medication Sig Dispense Refill  . albuterol (VENTOLIN HFA) 108 (90 Base) MCG/ACT inhaler Inhale 2 puffs into the lungs every 4 (four) hours as needed for wheezing or shortness of breath. 18 g 3  . ALPRAZolam (XANAX) 0.25 MG tablet Take 1 tablet (0.25 mg total) by mouth 2 (two) times daily. 45 tablet 3  . apixaban (ELIQUIS) 5 MG TABS tablet Take 1 tablet (5 mg total) by mouth 2 (two) times daily. 180 tablet 1  . benzonatate (TESSALON) 100 MG capsule Take 1 capsule (100 mg total) by mouth 2 (two) times daily as needed for cough. 20 capsule 0  . budesonide (PULMICORT) 0.25 MG/2ML nebulizer solution Take 2 mLs (0.25 mg total) by nebulization 2 (two) times daily. 60 mL 12  . cholestyramine (QUESTRAN) 4  g packet Take 1 packet (4 g total) by mouth 2 (two) times daily. 60 packet 5  . cyanocobalamin (,VITAMIN B-12,) 1000 MCG/ML injection Inject 1,000 mcg into the muscle every 30 (thirty) days.    . digoxin (LANOXIN) 0.125 MG tablet Take 1 tablet (0.125 mg total) by mouth daily. 90 tablet 1  . diltiazem (CARDIZEM CD) 360 MG 24 hr capsule Take 1 capsule (360 mg total)  by mouth daily. 90 capsule 1  . EPINEPHrine 0.3 mg/0.3 mL IJ SOAJ injection Inject 0.3 mg into the muscle as needed for anaphylaxis.     . furosemide (LASIX) 20 MG tablet Take 1 tablet (20 mg total) by mouth daily. 90 tablet 1  . ipratropium-albuterol (DUONEB) 0.5-2.5 (3) MG/3ML SOLN Take 3 mLs by nebulization every 4 (four) hours as needed. 120 mL 3  . levalbuterol (XOPENEX) 0.31 MG/3ML nebulizer solution Take 3 mLs by nebulization every 4 (four) hours as needed. For wheezing/ and or shortness of breath.    . lipase/protease/amylase (CREON) 36000 UNITS CPEP capsule Take 2 capsules with each meal and 1 with a snack 240 capsule 5  . losartan (COZAAR) 25 MG tablet Take 1 tablet (25 mg total) by mouth daily. 90 tablet 1  . montelukast (SINGULAIR) 10 MG tablet Take 1 tablet (10 mg total) by mouth daily. 90 tablet 1  . Multiple Vitamin (MULTIVITAMIN WITH MINERALS) TABS tablet Take 1 tablet by mouth daily.    . ondansetron (ZOFRAN) 4 MG tablet Take 1 tablet (4 mg total) by mouth every 8 (eight) hours as needed for nausea or vomiting. 60 tablet 1  . predniSONE (DELTASONE) 10 MG tablet Take 1 tablet (10 mg total) by mouth daily with breakfast. 90 tablet 1  . predniSONE (STERAPRED UNI-PAK 48 TAB) 10 MG (48) TBPK tablet 12 day taper - take by mouth as directed for 12 days 48 tablet 0  . Probiotic Product (PROBIOTIC-10) CAPS Take 1 capsule by mouth daily.     . rosuvastatin (CRESTOR) 20 MG tablet Take 1 tablet (20 mg total) by mouth daily. 90 tablet 3  . spironolactone (ALDACTONE) 25 MG tablet Take 1 tablet (25 mg total) by mouth 2 (two) times daily. 180 tablet 1  . theophylline (THEODUR) 300 MG 12 hr tablet Take 1 tablet (300 mg total) by mouth 2 (two) times daily. 180 tablet 1   Current Facility-Administered Medications  Medication Dose Route Frequency Provider Last Rate Last Admin  . cyanocobalamin ((VITAMIN B-12)) injection 1,000 mcg  1,000 mcg Intramuscular Once Ronnell Freshwater, NP        Allergies  as of 05/31/2020 - Review Complete 05/17/2020  Allergen Reaction Noted  . Flecainide Shortness Of Breath 11/27/2016  . Metoprolol Shortness Of Breath and Swelling 01/13/2020  . Propafenone Shortness Of Breath and Swelling 12/01/2016  . Rivaroxaban Other (See Comments) 10/23/2016    Review of Systems:    All systems reviewed and negative except where noted in HPI.  General Appearance:    Alert, cooperative, no distress, appears stated age  Head:    Normocephalic, without obvious abnormality, atraumatic  Eyes:    PERRL, conjunctiva/corneas clear,  Ears:    Grossly normal hearing    Neurologic:  Grossly normal    Observations/Objective:  Labs: CMP     Component Value Date/Time   NA 137 05/16/2020 1030   NA 144 03/20/2020 0830   NA 140 11/28/2013 2312   K 4.3 05/16/2020 1030   K 4.1 11/28/2013 2312   CL 101  05/16/2020 1030   CL 108 (H) 11/28/2013 2312   CO2 27 05/16/2020 1030   CO2 27 11/28/2013 2312   GLUCOSE 119 (H) 05/16/2020 1030   GLUCOSE 130 (H) 11/28/2013 2312   BUN 14 05/16/2020 1030   BUN 14 03/20/2020 0830   BUN 29 (H) 11/28/2013 2312   CREATININE 0.71 05/16/2020 1030   CREATININE 0.79 11/28/2013 2312   CALCIUM 9.0 05/16/2020 1030   CALCIUM 8.7 11/28/2013 2312   PROT 6.7 05/16/2020 1030   PROT 6.4 09/10/2018 0741   PROT 6.9 11/28/2013 2312   ALBUMIN 3.9 05/16/2020 1030   ALBUMIN 4.0 09/10/2018 0741   ALBUMIN 3.6 11/28/2013 2312   AST 20 05/16/2020 1030   AST 24 11/28/2013 2312   ALT 19 05/16/2020 1030   ALT 21 11/28/2013 2312   ALKPHOS 56 05/16/2020 1030   ALKPHOS 65 11/28/2013 2312   BILITOT 1.0 05/16/2020 1030   BILITOT 0.6 09/10/2018 0741   BILITOT 0.3 11/28/2013 2312   GFRNONAA >60 05/16/2020 1030   GFRNONAA >60 11/28/2013 2312   GFRAA >60 05/16/2020 1030   GFRAA >60 11/28/2013 2312   Lab Results  Component Value Date   WBC 9.6 02/21/2020   HGB 13.5 02/21/2020   HCT 42.5 02/21/2020   MCV 89.5 02/21/2020   PLT 315 02/21/2020    Imaging  Studies: No results found.  Assessment and Plan:   EMILE RINGGENBERG is a 69 y.o. y/o female here to follow-upfor diarrhea. She underwent a ileal and right colonic resection in September 2020 following an episode of bowel ischemia. She could be having diarrhea from the ileal resection related to bile salt mediated.  Her symptoms are suggestive of IBS-D.  Failed treatment with Imodium, Questran, Creon.   Plan 1.Commence on Xifaxan 550 mg 3 times daily for 14 days for IBS-D  Follow-up in 3 weeks     I discussed the assessment and treatment plan with the patient. The patient was provided an opportunity to ask questions and all were answered. The patient agreed with the plan and demonstrated an understanding of the instructions.   The patient was advised to call back or seek an in-person evaluation if the symptoms worsen or if the condition fails to improve as anticipated.  I provided 12 minutes of face-to-face time during this encounter.  Dr [name] MD,MRCP [facility]) Gastroenterology/Hepatology Pager: [phone]   Speech recognition software was used to dictate this note.

## 2020-06-01 ENCOUNTER — Telehealth: Payer: Self-pay

## 2020-06-01 ENCOUNTER — Other Ambulatory Visit: Payer: Self-pay

## 2020-06-01 ENCOUNTER — Telehealth: Payer: Self-pay | Admitting: Cardiovascular Disease

## 2020-06-01 MED ORDER — RIFAXIMIN 550 MG PO TABS: 550.0000 mg | ORAL_TABLET | Freq: Three times a day (TID) | ORAL | 0 refills | Status: AC

## 2020-06-01 NOTE — Telephone Encounter (Signed)
Confirmed appointment on 06/05/2020 and screened for covid. [facility]  

## 2020-06-01 NOTE — Telephone Encounter (Signed)
To Dr. Arida to review.   

## 2020-06-01 NOTE — Telephone Encounter (Signed)
Pt c/o medication issue:  1. Name of Medication: Eliquis  2. How are you currently taking this medication (dosage and times per day)? 5 mg bid  3. Are you having a reaction (difficulty breathing--STAT)? n/a  4. What is your medication issue? Pricing. Patient has retired and unable to keep up with the expense of eliquis. Patient is now almost out of medication and interested in Plavix or warfarin. Patient would like a new prescription for whichever Dr. Fletcher Anon feels is best to in [facility] in [place]   Please advise patient of decision

## 2020-06-01 NOTE — Telephone Encounter (Signed)
We can switch her to warfarin.  Schedule her to see Leafy Ro next week to establish with anticoagulation clinic.

## 2020-06-04 ENCOUNTER — Other Ambulatory Visit: Payer: Self-pay

## 2020-06-04 DIAGNOSIS — I482 Chronic atrial fibrillation, unspecified: Secondary | ICD-10-CM

## 2020-06-04 DIAGNOSIS — Z7901 Long term (current) use of anticoagulants: Secondary | ICD-10-CM

## 2020-06-04 DIAGNOSIS — Z5181 Encounter for therapeutic drug level monitoring: Secondary | ICD-10-CM

## 2020-06-04 MED ORDER — WARFARIN SODIUM 5 MG PO TABS
5.0000 mg | ORAL_TABLET | Freq: Every day | ORAL | 3 refills | Status: DC
Start: 2020-06-04 — End: 2020-06-13

## 2020-06-04 NOTE — Telephone Encounter (Signed)
Call to patient to discuss POC.   Pt agreeable with plan.   Husband not able to come until tom am to pick up samples. She will start coumadin wed 6/16. Samples of eliquis to hold her over placed in front office.    appt Monday 6/21 11:15 A w coumadin RN.

## 2020-06-04 NOTE — Telephone Encounter (Signed)
Loel Dubonnet, NP  Alroy Dust.Adriell Polansky, RN Recommend start Warfarin 5mg  daily with INR in 3-5 days she may start 6/16.   Would like her to remain on her Eliquis 5mg  BID for the first three days of taking Warfarin, then discontinue. If she does not have Eliquis at home we can provide sample if she is willing to pick up.   Schedule Coumadin Clinic appointment with [facility] 06/11/20.   Thanks!  Loel Dubonnet, NP    Call attempted. No answer, no vm.

## 2020-06-04 NOTE — Telephone Encounter (Signed)
MD will need to prescribe dosage of warfarin, then I can see her out anti-coag clinic after she has been on her dosage for 3-5 days.

## 2020-06-04 NOTE — Telephone Encounter (Signed)
Routing to coumadin nurse  

## 2020-06-04 NOTE — Addendum Note (Signed)
Addended by: Verlon Au on: 06/04/2020 04:50 PM   Modules accepted: Orders

## 2020-06-05 ENCOUNTER — Ambulatory Visit (INDEPENDENT_AMBULATORY_CARE_PROVIDER_SITE_OTHER): Payer: Medicare Other | Admitting: Internal Medicine

## 2020-06-05 ENCOUNTER — Other Ambulatory Visit: Payer: Self-pay

## 2020-06-05 ENCOUNTER — Encounter: Payer: Self-pay | Admitting: Internal Medicine

## 2020-06-05 ENCOUNTER — Telehealth: Payer: Self-pay | Admitting: Gastroenterology

## 2020-06-05 VITALS — BP 136/56 | HR 82 | Temp 97.6°F | Resp 16 | Ht 64.0 in | Wt 269.0 lb

## 2020-06-05 DIAGNOSIS — R0602 Shortness of breath: Secondary | ICD-10-CM | POA: Diagnosis not present

## 2020-06-05 DIAGNOSIS — J4520 Mild intermittent asthma, uncomplicated: Secondary | ICD-10-CM

## 2020-06-05 DIAGNOSIS — J301 Allergic rhinitis due to pollen: Secondary | ICD-10-CM

## 2020-06-05 DIAGNOSIS — I6523 Occlusion and stenosis of bilateral carotid arteries: Secondary | ICD-10-CM

## 2020-06-05 DIAGNOSIS — Z9989 Dependence on other enabling machines and devices: Secondary | ICD-10-CM

## 2020-06-05 DIAGNOSIS — G4733 Obstructive sleep apnea (adult) (pediatric): Secondary | ICD-10-CM

## 2020-06-05 DIAGNOSIS — I482 Chronic atrial fibrillation, unspecified: Secondary | ICD-10-CM

## 2020-06-05 NOTE — Patient Instructions (Signed)

## 2020-06-05 NOTE — Progress Notes (Signed)
[facility] [place], [place] [address]  Pulmonary Sleep Medicine   Office Visit Note  Patient Name: Laura [name] DOB: [birth year] MRN [id number]  Date of Service: 06/05/2020  Complaints/HPI: Asthma, OSA, Obesity.  She stated that her CPAP device is not functioning very well.  She continues to have issues with sleepiness now again.  She has not been able to use the device because of the malfunctioning.  The device is apparently more than 70 years old and she could actually qualify for a new device.  I do however want to know how her OSA is overall and so therefore I would recommend doing a sleep study on her to follow-up and see if her AHI has worsened.  As far as her breathing is concerned her asthma has been under fairly good control.  No recent admissions to the hospital.  She continues to use her medications as prescribed.  The patient also still having issues with her weight discussed weight loss diet exercise.  Patient is on Coumadin now for anticoagulation for atrial fibrillation because her current insurance does not pay for the other medications.  ROS  General: (-) fever, (-) chills, (-) night sweats, (-) weakness Skin: (-) rashes, (-) itching,. Eyes: (-) visual changes, (-) redness, (-) itching. Nose and Sinuses: (-) nasal stuffiness or itchiness, (-) postnasal drip, (-) nosebleeds, (-) sinus trouble. Mouth and Throat: (-) sore throat, (-) hoarseness. Neck: (-) swollen glands, (-) enlarged thyroid, (-) neck pain. Respiratory: - cough, (-) bloody sputum, + shortness of breath, - wheezing. Cardiovascular: - ankle swelling, (-) chest pain. Lymphatic: (-) lymph node enlargement. Neurologic: (-) numbness, (-) tingling. Psychiatric: (-) anxiety, (-) depression   Current Medication: Outpatient Encounter Medications as of 06/05/2020  Medication Sig  . albuterol (VENTOLIN HFA) 108 (90 Base) MCG/ACT inhaler Inhale 2 puffs into the lungs every 4 (four) hours  as needed for wheezing or shortness of breath.  . ALPRAZolam (XANAX) 0.25 MG tablet Take 1 tablet (0.25 mg total) by mouth 2 (two) times daily.  . benzonatate (TESSALON) 100 MG capsule Take 1 capsule (100 mg total) by mouth 2 (two) times daily as needed for cough.  . budesonide (PULMICORT) 0.25 MG/2ML nebulizer solution Take 2 mLs (0.25 mg total) by nebulization 2 (two) times daily.  . cholestyramine (QUESTRAN) 4 g packet Take 1 packet (4 g total) by mouth 2 (two) times daily.  . cyanocobalamin (,VITAMIN B-12,) 1000 MCG/ML injection Inject 1,000 mcg into the muscle every 30 (thirty) days.  . digoxin (LANOXIN) 0.125 MG tablet Take 1 tablet (0.125 mg total) by mouth daily.  Marland Kitchen diltiazem (CARDIZEM CD) 360 MG 24 hr capsule Take 1 capsule (360 mg total) by mouth daily.  Marland Kitchen EPINEPHrine 0.3 mg/0.3 mL IJ SOAJ injection Inject 0.3 mg into the muscle as needed for anaphylaxis.   . furosemide (LASIX) 20 MG tablet Take 1 tablet (20 mg total) by mouth daily.  Marland Kitchen ipratropium-albuterol (DUONEB) 0.5-2.5 (3) MG/3ML SOLN Take 3 mLs by nebulization every 4 (four) hours as needed.  . levalbuterol (XOPENEX) 0.31 MG/3ML nebulizer solution Take 3 mLs by nebulization every 4 (four) hours as needed. For wheezing/ and or shortness of breath.  . lipase/protease/amylase (CREON) 36000 UNITS CPEP capsule Take 2 capsules with each meal and 1 with a snack  . losartan (COZAAR) 25 MG tablet Take 1 tablet (25 mg total) by mouth daily.  . montelukast (SINGULAIR) 10 MG tablet Take 1 tablet (10 mg total) by mouth daily.  Marland Kitchen  Multiple Vitamin (MULTIVITAMIN WITH MINERALS) TABS tablet Take 1 tablet by mouth daily.  . ondansetron (ZOFRAN) 4 MG tablet Take 1 tablet (4 mg total) by mouth every 8 (eight) hours as needed for nausea or vomiting.  . predniSONE (DELTASONE) 10 MG tablet Take 1 tablet (10 mg total) by mouth daily with breakfast.  . predniSONE (STERAPRED UNI-PAK 48 TAB) 10 MG (48) TBPK tablet 12 day taper - take by mouth as directed for  12 days  . Probiotic Product (PROBIOTIC-10) CAPS Take 1 capsule by mouth daily.   . rifaximin (XIFAXAN) 550 MG TABS tablet Take 1 tablet (550 mg total) by mouth 3 (three) times daily for 14 days.  . rosuvastatin (CRESTOR) 20 MG tablet Take 1 tablet (20 mg total) by mouth daily.  Marland Kitchen spironolactone (ALDACTONE) 25 MG tablet Take 1 tablet (25 mg total) by mouth 2 (two) times daily.  . theophylline (THEODUR) 300 MG 12 hr tablet Take 1 tablet (300 mg total) by mouth 2 (two) times daily.  Marland Kitchen warfarin (COUMADIN) 5 MG tablet Take 1 tablet (5 mg total) by mouth daily.  . [DISCONTINUED] apixaban (ELIQUIS) 5 MG TABS tablet Take 1 tablet (5 mg total) by mouth 2 (two) times daily.   Facility-Administered Encounter Medications as of 06/05/2020  Medication Note  . cyanocobalamin ((VITAMIN B-12)) injection 1,000 mcg 02/27/2020: Due 02/28/2020    Surgical History: Past Surgical History:  Procedure Laterality Date  . BOWEL RESECTION  09/11/2019   Procedure: SMALL BOWEL RESECTION;  Surgeon: Herbert Pun, MD;  Location: [facility] ORS;  Service: General;;  . CARDIAC CATHETERIZATION    . cataract surgery    . COLONOSCOPY WITH PROPOFOL N/A 03/19/2020   Procedure: COLONOSCOPY WITH PROPOFOL;  Surgeon: [name], MD;  Location: [facility] ENDOSCOPY;  Service: Gastroenterology;  Laterality: N/A;  . CORONARY ANGIOPLASTY    . INCISION AND DRAINAGE ABSCESS Right 06/29/2016   Procedure: INCISION AND DRAINAGE ABSCESS;  Surgeon: Florene Glen, MD;  Location: [facility] ORS;  Service: General;  Laterality: Right;  . INCISION AND DRAINAGE OF WOUND Left 06/29/2016   Procedure: IRRIGATION AND DEBRIDEMENT WOUND;  Surgeon: Florene Glen, MD;  Location: [facility] ORS;  Service: General;  Laterality: Left;  . LAPAROSCOPIC RIGHT COLECTOMY  09/11/2019   Procedure: RIGHT COLECTOMY;  Surgeon: Herbert Pun, MD;  Location: [facility] ORS;  Service: General;;  . LAPAROSCOPY N/A 09/11/2019   Procedure: LAPAROSCOPY DIAGNOSTIC;  Surgeon: Herbert Pun, MD;  Location: [facility] ORS;  Service: General;  Laterality: N/A;  . LAPAROTOMY N/A 09/13/2019   Procedure: REOPENING OF RECENT LAPAROTOMYANASTOMOSIS OF BOWEL;  Surgeon: Herbert Pun, MD;  Location: [facility] ORS;  Service: General;  Laterality: N/A;  . RIGHT/LEFT HEART CATH AND CORONARY ANGIOGRAPHY Bilateral 02/27/2020   Procedure: RIGHT/LEFT HEART CATH AND CORONARY ANGIOGRAPHY;  Surgeon: Wellington Hampshire, MD;  Location: [place] [facility] LAB;  Service: Cardiovascular;  Laterality: Bilateral;  . VISCERAL ANGIOGRAPHY N/A 09/12/2019   Procedure: VISCERAL ANGIOGRAPHY;  Surgeon: Algernon Huxley, MD;  Location: [place] [facility];  Service: Cardiovascular;  Laterality: N/A;    Medical History: Past Medical History:  Diagnosis Date  . Asthma   . Chronic atrial fibrillation ([place])    a. diagnosed in 09/2016; b. failed flecainide and propafenone due to LE swelling and SOB, could not afford Multaq; c. CHADS2VASc => 5 (CHF, HTN, age x 1, nonobs CAD, female)--> Eliquis  . COPD (chronic obstructive pulmonary disease) ([place])   . GERD (gastroesophageal reflux disease)   . HFmrEF (heart failure with  mid-range ejection fraction) ([place])    a. 12/2019 Echo: EF 40-45%.  . Hyperlipidemia   . Hypertension   . NICM (nonischemic cardiomyopathy) ([place])    a. 12/2019 Echo: EF 40-45%, glob HK, mildly reduced RV fxn, sev dil LA. *HR 130 (afib) during study.  . Nonobstructive CAD (coronary artery disease)    a. Lexiscan Myoview 10/2016: no evidence of ischemia, EF 53%; b. 02/2020 Cath: LM nl, LAD 20p, 22m, LCX 20ost, OM1/2/3 nl, LPDA nl, LPL1/2 nl, LPAV nl, RCA small, nl.  . Obesity   . Obstructive sleep apnea   . Pulmonary hypertension ([place])   . Systolic dysfunction    a. TTE 10/2016: EF 50%, mild LVH, moderately dilated LA, moderate MR/TR, mild pulmonary hypertension    Family History: Family History  Problem Relation Age of Onset  . Dementia Mother   . Osteoporosis Mother   . Vascular Disease Mother   .  COPD Father     Social History: Social History   Socioeconomic History  . Marital status: Married    Spouse name: [name]   . Number of children: 2  . Years of education: Not on file  . Highest education level: Not on file  Occupational History  . Not on file  Tobacco Use  . Smoking status: Never Smoker  . Smokeless tobacco: Never Used  Vaping Use  . Vaping Use: Never used  Substance and Sexual Activity  . Alcohol use: No  . Drug use: No  . Sexual activity: Not on file  Other Topics Concern  . Not on file  Social History Narrative   3 grandchildren: 7 great grandkids.    Social Determinants of Health   Financial Resource Strain:   . Difficulty of Paying Living Expenses:   Food Insecurity:   . Worried About Charity fundraiser in the Last Year:   . Arboriculturist in the Last Year:   Transportation Needs:   . Film/video editor (Medical):   Marland Kitchen Lack of Transportation (Non-Medical):   Physical Activity:   . Days of Exercise per Week:   . Minutes of Exercise per Session:   Stress:   . Feeling of Stress :   Social Connections:   . Frequency of Communication with Friends and Family:   . Frequency of Social Gatherings with Friends and Family:   . Attends Religious Services:   . Active Member of Clubs or Organizations:   . Attends Archivist Meetings:   Marland Kitchen Marital Status:   Intimate Partner Violence:   . Fear of Current or Ex-Partner:   . Emotionally Abused:   Marland Kitchen Physically Abused:   . Sexually Abused:     Vital Signs: Blood pressure (!) 136/56, pulse 82, temperature 97.6 F (36.4 C), resp. rate 16, height 5\' 4"  (1.626 m), weight 269 lb (122 kg), SpO2 97 %.  Examination: General Appearance: The patient is well-developed, well-nourished, and in no distress. Skin: Gross inspection of skin unremarkable. Head: normocephalic, no gross deformities. Eyes: no gross deformities noted. ENT: ears appear grossly normal no exudates. Neck: Supple. No  thyromegaly. No LAD. Respiratory: no rhonchi noted. Cardiovascular: Normal S1 and S2 without murmur or rub. Extremities: No cyanosis. pulses are equal. Neurologic: Alert and oriented. No involuntary movements.  LABS: Recent Results (from the past 2160 hour(s))  SARS CORONAVIRUS 2 (TAT 6-24 HRS) Nasopharyngeal Nasopharyngeal Swab     Status: None   Collection Time: 03/15/20 10:25 AM   Specimen: Nasopharyngeal Swab  Result Value  Ref Range   SARS Coronavirus 2 NEGATIVE NEGATIVE    Comment: (NOTE) SARS-CoV-2 target nucleic acids are NOT DETECTED. The SARS-CoV-2 RNA is generally detectable in upper and lower respiratory specimens during the acute phase of infection. Negative results do not preclude SARS-CoV-2 infection, do not rule out co-infections with other pathogens, and should not be used as the sole basis for treatment or other patient management decisions. Negative results must be combined with clinical observations, patient history, and epidemiological information. The expected result is Negative. Fact Sheet for Patients: SugarRoll.be Fact Sheet for Healthcare Providers: https://www.woods-mathews.com/ This test is not yet approved or cleared by the [place] FDA and  has been authorized for detection and/or diagnosis of SARS-CoV-2 by FDA under an Emergency Use Authorization (EUA). This EUA will remain  in effect (meaning this test can be used) for the duration of the COVID-19 declaration under Section 56 4(b)(1) of the Act, 21 U.S.C. section 360bbb-3(b)(1), unless the authorization is terminated or revoked sooner. Performed at [facility], [place] [address], [place], [place] [address]   Surgical pathology     Status: None   Collection Time: 03/19/20 10:24 AM  Result Value Ref Range   SURGICAL PATHOLOGY      SURGICAL PATHOLOGY CASE: [id number] PATIENT: Doran Stabler Surgical Pathology Report     Specimen  Submitted: A. Terminal ileum; cbx B. Colon, random; cbx C. Rectum; cbx  Clinical History: Diarrhea R19.7      DIAGNOSIS: A. TERMINAL ILEUM; COLD BIOPSY: - UNREMARKABLE ENTERIC MUCOSA. - NEGATIVE FOR INFLAMMATION, DYSPLASIA, AND MALIGNANCY.  B. COLON, RANDOM; COLD BIOPSY: - UNREMARKABLE COLONIC MUCOSA. - NEGATIVE FOR MICROSCOPIC COLITIS, DYSPLASIA, AND MALIGNANCY.  C. RECTUM; COLD BIOPSY: - COLORECTAL MUCOSA WITH SUPERFICIAL HYPERPLASTIC CHANGES. - NEGATIVE FOR MICROSCOPIC COLITIS. - NEGATIVE FOR DYSPLASIA AND MALIGNANCY.   GROSS DESCRIPTION: A. Labeled: Cold biopsies terminal ileum rule out Crohn's Received: In formalin Tissue fragment(s): 2 Size: 0.5 x 0.3 x 0.1 cm Description: Aggregate of pink-red tissue fragments Entirely submitted in 1 cassette.  B. Labeled: Random colon cold biopsies rule out microscopic colitis Received: In formalin Ti ssue fragment(s): 3 Size: 0.5 x 0.2 x 0.1 cm Description: Aggregate of pink-white tissue fragments Entirely submitted in 1 cassette.  C. Labeled: Rectal cold biopsies rule out microscopic colitis Received: In formalin Tissue fragment(s): Multiple Size: 1.5 x 0.5 x 0.1 cm Description: Aggregate of tan tissue fragments Entirely submitted in 1 cassette.   Final Diagnosis performed by Betsy Pries, MD.   Electronically signed 03/20/2020 11:44:48AM The electronic signature indicates that the named Attending Pathologist has evaluated the specimen Technical component performed at [facility], [address], [place], [place] [address] Lab: [phone] Dir: Rush Farmer, MD, MMM  Professional component performed at [facility], [facility], [place], [place],  [address] Lab: [phone] Dir: Dellia Nims. Rubinas, MD   Lipid panel     Status: Abnormal   Collection Time: 03/20/20  8:30 AM  Result Value Ref Range   Cholesterol, Total 196 100 - 199 mg/dL   Triglycerides 142 0 - 149 mg/dL   HDL 68 >39  mg/dL   VLDL Cholesterol Cal 25 5 - 40 mg/dL   LDL Chol Calc (NIH) 103 (H) 0 - 99 mg/dL   Chol/HDL Ratio 2.9 0.0 - 4.4 ratio    Comment:  T. Chol/HDL Ratio                                             Men  Women                               1/2 Avg.Risk  3.4    3.3                                   Avg.Risk  5.0    4.4                                2X Avg.Risk  9.6    7.1                                3X Avg.Risk 23.4   08.6   Basic metabolic panel     Status: None   Collection Time: 03/20/20  8:30 AM  Result Value Ref Range   Glucose 92 65 - 99 mg/dL   BUN 14 8 - 27 mg/dL   Creatinine, Ser 0.87 0.57 - 1.00 mg/dL   GFR calc non Af Amer 68 >59 mL/min/1.73   GFR calc Af Amer 79 >59 mL/min/1.73   BUN/Creatinine Ratio 16 12 - 28   Sodium 144 134 - 144 mmol/L   Potassium 4.2 3.5 - 5.2 mmol/L   Chloride 102 96 - 106 mmol/L   CO2 24 20 - 29 mmol/L   Calcium 9.4 8.7 - 10.3 mg/dL  Comprehensive metabolic panel     Status: Abnormal   Collection Time: 05/16/20 10:30 AM  Result Value Ref Range   Sodium 137 135 - 145 mmol/L   Potassium 4.3 3.5 - 5.1 mmol/L   Chloride 101 98 - 111 mmol/L   CO2 27 22 - 32 mmol/L   Glucose, Bld 119 (H) 70 - 99 mg/dL    Comment: Glucose reference range applies only to samples taken after fasting for at least 8 hours.   BUN 14 8 - 23 mg/dL   Creatinine, Ser 0.71 0.44 - 1.00 mg/dL   Calcium 9.0 8.9 - 10.3 mg/dL   Total Protein 6.7 6.5 - 8.1 g/dL   Albumin 3.9 3.5 - 5.0 g/dL   AST 20 15 - 41 U/L   ALT 19 0 - 44 U/L   Alkaline Phosphatase 56 38 - 126 U/L   Total Bilirubin 1.0 0.3 - 1.2 mg/dL   GFR calc non Af Amer >60 >60 mL/min   GFR calc Af Amer >60 >60 mL/min   Anion gap 9 5 - 15    Comment: Performed at [facility], [address] [place], [place] [address]  Lipid panel     Status: None   Collection Time: 05/16/20 10:30 AM  Result Value Ref Range   Cholesterol 136 0 - 200 mg/dL   Triglycerides 147 <150  mg/dL   HDL 66 >40 mg/dL   Total CHOL/HDL Ratio 2.1 RATIO   VLDL 29 0 - 40 mg/dL   LDL Cholesterol 41 0 - 99 mg/dL    Comment:  Total Cholesterol/HDL:CHD Risk Coronary Heart Disease Risk Table                     Men   Women  1/2 Average Risk   3.4   3.3  Average Risk       5.0   4.4  2 X Average Risk   9.6   7.1  3 X Average Risk  23.4   11.0        Use the calculated Patient Ratio above and the CHD Risk Table to determine the patient's CHD Risk.        ATP III CLASSIFICATION (LDL):  <100     mg/dL   Optimal  100-129  mg/dL   Near or Above                    Optimal  130-159  mg/dL   Borderline  160-189  mg/dL   High  >190     mg/dL   Very High Performed at [facility], [address], [place], [place] [address]     Radiology: No results found.  No results found.  No results found.    Assessment and Plan: Patient Active Problem List   Diagnosis Date Noted  . Acute right-sided low back pain with right-sided sciatica 05/21/2020  . Chronic systolic heart failure ([place])   . Pulmonary hypertension, unspecified ([place])   . Nutritional deficiency 01/02/2020  . Memory deficit 01/02/2020  . Encounter for therapeutic drug level monitoring 01/02/2020  . B12 deficiency 01/02/2020  . PAD (peripheral artery disease) ([place]) 12/05/2019  . Intractable vomiting 10/09/2019  . Intestinal ischemia ([place])   . COPD with acute exacerbation ([place])   . Atrial fibrillation with rapid ventricular response ([place]) 09/11/2019  . Enteritis   . Peripheral edema 07/17/2019  . Atherosclerosis of both carotid arteries 07/17/2019  . Varicose veins of right lower extremity with inflammation 06/08/2019  . Gastroesophageal reflux disease with esophagitis 05/08/2019  . Fall at home, initial encounter 03/16/2019  . Contusion of right lower leg 03/16/2019  . Flu-like symptoms 02/03/2019  . Nausea 02/03/2019  . Suprapatellar bursitis of right knee 01/23/2019  . Pain in right leg  01/23/2019  . Cellulitis of right leg 01/23/2019  . Chronic venous stasis dermatitis of right lower extremity 01/23/2019  . Oropharyngeal candidiasis 01/23/2019  . Cellulitis of head except face 12/23/2018  . Screening for breast cancer 08/15/2018  . Chronic bronchitis with acute exacerbation ([place]) 08/15/2018  . Vitamin D deficiency 08/15/2018  . Need for vaccination against Streptococcus pneumoniae using pneumococcal conjugate vaccine 13 08/15/2018  . Obstructive chronic bronchitis without exacerbation ([place]) 07/14/2018  . Body mass index (BMI) 45.0-49.9, adult 03/17/2018  . Asthma 02/25/2018  . Generalized anxiety disorder 12/16/2017  . Essential hypertension 12/16/2017  . Chronic respiratory failure with hypoxia ([place]) 12/16/2017  . Long term (current) use of anticoagulants 12/16/2017  . Chronic atrial fibrillation ([place]) 12/16/2017  . Other specified functional intestinal disorders 12/16/2017  . Gastro-esophageal reflux disease without esophagitis 12/16/2017  . OSA on CPAP 12/16/2017  . Allergic rhinitis due to animal (cat) (dog) hair and dander 12/16/2017  . Allergic rhinitis due to pollen 12/16/2017  . Severe persistent asthma without complication 57/12/7791  . Cough 12/16/2017  . SOB (shortness of breath) 12/16/2017  . Superficial thrombophlebitis of right leg 11/17/2016  . Pain in limb 11/17/2016  . Chronic venous insufficiency 11/17/2016  . Swelling of limb 11/17/2016  . Cellulitis of buttock   .  Cellulitis of right axilla   . Sepsis ([place]) 06/28/2016  . Cellulitis and abscess 06/28/2016  . Hypokalemia 06/28/2016  . Acute renal insufficiency 06/28/2016  . Hyperglycemia 06/28/2016  . Leukocytosis 06/28/2016  . Hypoxia 06/28/2016  . Collagenous colitis 05/13/2016    1. Asthma on albuterol. She uses it once per week. No steroids. Not using nebs either 2. OSA having some issues with her CPAP malfunctioning. It is over 92 years old and she is not getting proper control of her  sleep because of the machine not functioning properly..  Also I am concerned whether her OSA may have actually worsened so therefore patient will need a follow up PSG. Will get this scheduled 3. Chronic atrial fibrillation she will be going on coumadin because of her insurance change as her current insurance does not cover the other medications 4. Allergic rhinitis under control. She takes antihistamines as needed  General Counseling: I have discussed the findings of the evaluation and examination with [name].  I have also discussed any further diagnostic evaluation thatmay be needed or ordered today. Duyen verbalizes understanding of the findings of todays visit. We also reviewed her medications today and discussed drug interactions and side effects including but not limited excessive drowsiness and altered mental states. We also discussed that there is always a risk not just to her but also people around her. she has been encouraged to call the office with any questions or concerns that should arise related to todays visit.  Orders Placed This Encounter  Procedures  . Spirometry with Graph    Order Specific Question:   Where should this test be performed?    Answer:   [facility]  . Pulmonary function test    Standing Status:   Future    Standing Expiration Date:   06/05/2021    Order Specific Question:   Where should this test be performed?    Answer:   [facility]  . Home sleep test    Order Specific Question:   Where should this test be performed:    Answer:   [facility]     Time spent: 66min  I have personally obtained a history, examined the patient, evaluated laboratory and imaging results, formulated the assessment and plan and placed orders.    Allyne Gee, MD [facility] Pulmonary and Critical Care Sleep medicine

## 2020-06-05 NOTE — Telephone Encounter (Signed)
LVM for patient to call office to schedule an virtual appt. Per [place]

## 2020-06-06 ENCOUNTER — Telehealth: Payer: Self-pay

## 2020-06-06 NOTE — Telephone Encounter (Signed)
Left a message and asked pt to call back and schedule home sleep test, I have benefits saved at my folder she just needs to return call and schedule. Beth

## 2020-06-07 ENCOUNTER — Ambulatory Visit (INDEPENDENT_AMBULATORY_CARE_PROVIDER_SITE_OTHER): Payer: Medicare Other

## 2020-06-07 ENCOUNTER — Other Ambulatory Visit: Payer: Self-pay

## 2020-06-07 ENCOUNTER — Other Ambulatory Visit: Payer: Medicare Other | Admitting: Internal Medicine

## 2020-06-07 ENCOUNTER — Encounter (INDEPENDENT_AMBULATORY_CARE_PROVIDER_SITE_OTHER): Payer: Self-pay | Admitting: Vascular Surgery

## 2020-06-07 ENCOUNTER — Ambulatory Visit (INDEPENDENT_AMBULATORY_CARE_PROVIDER_SITE_OTHER): Payer: Medicare Other | Admitting: Vascular Surgery

## 2020-06-07 VITALS — BP 139/83 | HR 90 | Resp 18 | Ht 64.0 in | Wt 269.0 lb

## 2020-06-07 DIAGNOSIS — J4550 Severe persistent asthma, uncomplicated: Secondary | ICD-10-CM

## 2020-06-07 DIAGNOSIS — I739 Peripheral vascular disease, unspecified: Secondary | ICD-10-CM

## 2020-06-07 DIAGNOSIS — I6523 Occlusion and stenosis of bilateral carotid arteries: Secondary | ICD-10-CM | POA: Diagnosis not present

## 2020-06-07 DIAGNOSIS — I482 Chronic atrial fibrillation, unspecified: Secondary | ICD-10-CM | POA: Diagnosis not present

## 2020-06-07 DIAGNOSIS — I10 Essential (primary) hypertension: Secondary | ICD-10-CM

## 2020-06-07 DIAGNOSIS — I890 Lymphedema, not elsewhere classified: Secondary | ICD-10-CM

## 2020-06-07 NOTE — Progress Notes (Signed)
MRN : [id number]  Laura [name] is a 69 y.o. (December 15, 1951) female who presents with chief complaint of No chief complaint on file. Marland Kitchen  History of Present Illness:   The patient is seen for follow up evaluation of carotid stenosis. The carotid stenosis followed by ultrasound.   The patient denies amaurosis fugax. There is no recent history of TIA symptoms or focal motor deficits. There is no prior documented CVA.  The patient is taking enteric-coated aspirin 81 mg daily.  There is no history of migraine headaches. There is no history of seizures.  She was seen 4 years ago for pain and swelling of the lower extremity and found to have DVT. The patient denies history of PE, no history of recurrent DVT or right leg superficial thrombophlebitis. She continues to have significant pain and severe swelling of the leg. She has been trying to wear compression.  The patient has a history of coronary artery disease, no recent episodes of angina or shortness of breath. The patient denies PAD or claudication symptoms. There is a history of hyperlipidemia which is being treated with a statin.    Carotid Duplex done today shows <30% bilateral ICA stenosis.  No change compared to last study.  ABI Rt=1.18 and Lt=1.20  No outpatient medications have been marked as taking for the 06/07/20 encounter (Appointment) with Delana Meyer, Dolores Lory, MD.   Current Facility-Administered Medications for the 06/07/20 encounter (Appointment) with Delana Meyer, Dolores Lory, MD  Medication  . cyanocobalamin ((VITAMIN B-12)) injection 1,000 mcg    Past Medical History:  Diagnosis Date  . Asthma   . Chronic atrial fibrillation ([place])    a. diagnosed in 09/2016; b. failed flecainide and propafenone due to LE swelling and SOB, could not afford Multaq; c. CHADS2VASc => 5 (CHF, HTN, age x 1, nonobs CAD, female)--> Eliquis  . COPD (chronic obstructive pulmonary disease) ([place])   . GERD (gastroesophageal reflux disease)   .  HFmrEF (heart failure with mid-range ejection fraction) ([place])    a. 12/2019 Echo: EF 40-45%.  . Hyperlipidemia   . Hypertension   . NICM (nonischemic cardiomyopathy) ([facility] [place])    a. 12/2019 Echo: EF 40-45%, glob HK, mildly reduced RV fxn, sev dil LA. *HR 130 (afib) during study.  . Nonobstructive CAD (coronary artery disease)    a. Lexiscan Myoview 10/2016: no evidence of ischemia, EF 53%; b. 02/2020 Cath: LM nl, LAD 20p, 24m, LCX 20ost, OM1/2/3 nl, LPDA nl, LPL1/2 nl, LPAV nl, RCA small, nl.  . Obesity   . Obstructive sleep apnea   . Pulmonary hypertension ([place])   . Systolic dysfunction    a. TTE 10/2016: EF 50%, mild LVH, moderately dilated LA, moderate MR/TR, mild pulmonary hypertension    Past Surgical History:  Procedure Laterality Date  . BOWEL RESECTION  09/11/2019   Procedure: SMALL BOWEL RESECTION;  Surgeon: Herbert Pun, MD;  Location: [facility] ORS;  Service: General;;  . CARDIAC CATHETERIZATION    . cataract surgery    . COLONOSCOPY WITH PROPOFOL N/A 03/19/2020   Procedure: COLONOSCOPY WITH PROPOFOL;  Surgeon: [name], MD;  Location: [facility] ENDOSCOPY;  Service: Gastroenterology;  Laterality: N/A;  . CORONARY ANGIOPLASTY    . INCISION AND DRAINAGE ABSCESS Right 06/29/2016   Procedure: INCISION AND DRAINAGE ABSCESS;  Surgeon: Florene Glen, MD;  Location: [facility] ORS;  Service: General;  Laterality: Right;  . INCISION AND DRAINAGE OF WOUND Left 06/29/2016   Procedure: IRRIGATION AND DEBRIDEMENT WOUND;  Surgeon: Florene Glen, MD;  Location: [facility] ORS;  Service: General;  Laterality: Left;  . LAPAROSCOPIC RIGHT COLECTOMY  09/11/2019   Procedure: RIGHT COLECTOMY;  Surgeon: Herbert Pun, MD;  Location: [facility] ORS;  Service: General;;  . LAPAROSCOPY N/A 09/11/2019   Procedure: LAPAROSCOPY DIAGNOSTIC;  Surgeon: Herbert Pun, MD;  Location: [facility] ORS;  Service: General;  Laterality: N/A;  . LAPAROTOMY N/A 09/13/2019   Procedure: REOPENING OF RECENT LAPAROTOMYANASTOMOSIS OF  BOWEL;  Surgeon: Herbert Pun, MD;  Location: [facility] ORS;  Service: General;  Laterality: N/A;  . RIGHT/LEFT HEART CATH AND CORONARY ANGIOGRAPHY Bilateral 02/27/2020   Procedure: RIGHT/LEFT HEART CATH AND CORONARY ANGIOGRAPHY;  Surgeon: Wellington Hampshire, MD;  Location: [place] CV LAB;  Service: Cardiovascular;  Laterality: Bilateral;  . VISCERAL ANGIOGRAPHY N/A 09/12/2019   Procedure: VISCERAL ANGIOGRAPHY;  Surgeon: Algernon Huxley, MD;  Location: [place] [facility];  Service: Cardiovascular;  Laterality: N/A;    Social History Social History   Tobacco Use  . Smoking status: Never Smoker  . Smokeless tobacco: Never Used  Vaping Use  . Vaping Use: Never used  Substance Use Topics  . Alcohol use: No  . Drug use: No    Family History Family History  Problem Relation Age of Onset  . Dementia Mother   . Osteoporosis Mother   . Vascular Disease Mother   . COPD Father     Allergies  Allergen Reactions  . Flecainide Shortness Of Breath  . Metoprolol Shortness Of Breath and Swelling  . Propafenone Shortness Of Breath and Swelling  . Rivaroxaban Other (See Comments)     REVIEW OF SYSTEMS (Negative unless checked)  Constitutional: [] Weight loss  [] Fever  [] Chills Cardiac: [] Chest pain   [] Chest pressure   [] Palpitations   [] Shortness of breath when laying flat   [] Shortness of breath with exertion. Vascular:  [] Pain in legs with walking   [x] Pain in legs at rest  [] History of DVT   [] Phlebitis   [x] Swelling in legs   [] Varicose veins   [] Non-healing ulcers Pulmonary:   [] Uses home oxygen   [] Productive cough   [] Hemoptysis   [] Wheeze  [] COPD   [] Asthma Neurologic:  [] Dizziness   [] Seizures   [] History of stroke   [] History of TIA  [] Aphasia   [] Vissual changes   [] Weakness or numbness in arm   [] Weakness or numbness in leg Musculoskeletal:   [] Joint swelling   [x] Joint pain   [] Low back pain Hematologic:  [] Easy bruising  [] Easy bleeding   [] Hypercoagulable state    [] Anemic Gastrointestinal:  [] Diarrhea   [] Vomiting  [] Gastroesophageal reflux/heartburn   [] Difficulty swallowing. Genitourinary:  [] Chronic kidney disease   [] Difficult urination  [] Frequent urination   [] Blood in urine Skin:  [] Rashes   [] Ulcers  Psychological:  [] History of anxiety   []  History of major depression.  Physical Examination  There were no vitals filed for this visit. There is no height or weight on file to calculate BMI. Gen: WD/WN, NAD Head: [place]/AT, No temporalis wasting.  Ear/Nose/Throat: Hearing grossly intact, nares w/o erythema or drainage Eyes: PER, EOMI, sclera nonicteric.  Neck: Supple, no large masses.   Pulmonary:  Good air movement, no audible wheezing bilaterally, no use of accessory muscles.  Cardiac: RRR, no JVD Vascular: scattered varicosities present bilaterally.  Severe venous stasis changes to the legs bilaterally.  3-4+ soft pitting edema Vessel Right Left  Radial Palpable Palpable  PT Not Palpable Not Palpable  DP Trace Palpable Trace Palpable  Gastrointestinal: Non-distended. No guarding/no peritoneal signs.  Musculoskeletal: M/S 5/5 throughout.  No deformity or atrophy.  Neurologic: CN 2-12 intact. Symmetrical.  Speech is fluent. Motor exam as listed above. Psychiatric: Judgment intact, Mood & affect appropriate for pt's clinical situation. Dermatologic: No rashes or ulcers noted.  No changes consistent with cellulitis.   CBC Lab Results  Component Value Date   WBC 9.6 02/21/2020   HGB 13.5 02/21/2020   HCT 42.5 02/21/2020   MCV 89.5 02/21/2020   PLT 315 02/21/2020    BMET    Component Value Date/Time   NA 137 05/16/2020 1030   NA 144 03/20/2020 0830   NA 140 11/28/2013 2312   K 4.3 05/16/2020 1030   K 4.1 11/28/2013 2312   CL 101 05/16/2020 1030   CL 108 (H) 11/28/2013 2312   CO2 27 05/16/2020 1030   CO2 27 11/28/2013 2312   GLUCOSE 119 (H) 05/16/2020 1030   GLUCOSE 130 (H) 11/28/2013 2312   BUN 14 05/16/2020 1030   BUN 14  03/20/2020 0830   BUN 29 (H) 11/28/2013 2312   CREATININE 0.71 05/16/2020 1030   CREATININE 0.79 11/28/2013 2312   CALCIUM 9.0 05/16/2020 1030   CALCIUM 8.7 11/28/2013 2312   GFRNONAA >60 05/16/2020 1030   GFRNONAA >60 11/28/2013 2312   GFRAA >60 05/16/2020 1030   GFRAA >60 11/28/2013 2312   CrCl cannot be calculated (Patient's most recent lab result is older than the maximum 21 days allowed.).  COAG Lab Results  Component Value Date   INR 1.2 09/11/2019    Radiology No results found.   Assessment/Plan 1. PAD (peripheral artery disease) (HCC)  Recommend:  The patient has evidence of atherosclerosis of the lower extremities with claudication.  The patient does not voice lifestyle limiting changes at this point in time.  Noninvasive studies do not suggest clinically significant change.  No invasive studies, angiography or surgery at this time The patient should continue walking and begin a more formal exercise program.  The patient should continue antiplatelet therapy and aggressive treatment of the lipid abnormalities  No changes in the patient's medications at this time  The patient should continue wearing graduated compression socks 10-15 mmHg strength to control the mild edema.    2. Atherosclerosis of both carotid arteries Recommend:  Given the patient's asymptomatic subcritical stenosis no further invasive testing or surgery at this time.  Duplex ultrasound shows <40% stenosis bilaterally.  Continue antiplatelet therapy as prescribed Continue management of CAD, HTN and Hyperlipidemia Healthy heart diet,  encouraged exercise at least 4 times per week Follow up in 12 months with duplex ultrasound and physical exam   3. Lymphedema Recommend:  No surgery or intervention at this point in time.    I have reviewed my previous discussion with the patient regarding swelling and why it causes symptoms.  Patient will continue wearing graduated compression stockings  class 1 (20-30 mmHg) on a daily basis. The patient will  beginning wearing the stockings first thing in the morning and removing them in the evening. The patient is instructed specifically not to sleep in the stockings.    In addition, behavioral modification including several periods of elevation of the lower extremities during the day will be continued.  This was reviewed with the patient during the initial visit.  The patient will also continue routine exercise, especially walking on a daily basis as was discussed during the initial visit.    Despite conservative treatments including graduated compression therapy class 1 and behavioral modification including exercise and elevation  the patient  has not obtained adequate control of the lymphedema.  The patient still has stage 3 lymphedema and therefore, I believe that a lymph pump should be added to improve the control of the patient's lymphedema.  Additionally, a lymph pump is warranted because it will reduce the risk of cellulitis and ulceration in the future.  Patient should follow-up in six months    4. Chronic atrial fibrillation (HCC) Continue antiarrhythmia medications as already ordered, these medications have been reviewed and there are no changes at this time.  Continue anticoagulation as ordered by Cardiology Service   5. Essential hypertension Continue antihypertensive medications as already ordered, these medications have been reviewed and there are no changes at this time.   6. Severe persistent asthma without complication Continue pulmonary medications and aerosols as already ordered, these medications have been reviewed and there are no changes at this time.    Hortencia Pilar, MD  06/07/2020 8:38 AM

## 2020-06-11 ENCOUNTER — Ambulatory Visit (INDEPENDENT_AMBULATORY_CARE_PROVIDER_SITE_OTHER): Payer: Medicare Other

## 2020-06-11 ENCOUNTER — Other Ambulatory Visit: Payer: Self-pay

## 2020-06-11 DIAGNOSIS — Z7901 Long term (current) use of anticoagulants: Secondary | ICD-10-CM

## 2020-06-11 DIAGNOSIS — I482 Chronic atrial fibrillation, unspecified: Secondary | ICD-10-CM

## 2020-06-11 DIAGNOSIS — Z5181 Encounter for therapeutic drug level monitoring: Secondary | ICD-10-CM

## 2020-06-11 LAB — POCT INR: INR: 3.9 — AB (ref 2.0–3.0)

## 2020-06-11 NOTE — Patient Instructions (Signed)
-   since you've already taken it today, skip your warfarin tomorrow, - then START NEW DOSAGE of 1 tablet every day EXCEPT 1/2 MONDAYS, Laura [name].  - Recheck in 1 week.

## 2020-06-12 DIAGNOSIS — S56319A Strain of extensor or abductor muscles, fascia and tendons of unspecified thumb at forearm level, initial encounter: Secondary | ICD-10-CM | POA: Insufficient documentation

## 2020-06-13 ENCOUNTER — Ambulatory Visit (INDEPENDENT_AMBULATORY_CARE_PROVIDER_SITE_OTHER): Payer: Medicare Other | Admitting: Internal Medicine

## 2020-06-13 ENCOUNTER — Ambulatory Visit (INDEPENDENT_AMBULATORY_CARE_PROVIDER_SITE_OTHER): Payer: Medicare Other

## 2020-06-13 ENCOUNTER — Telehealth: Payer: Self-pay | Admitting: Cardiovascular Disease

## 2020-06-13 ENCOUNTER — Other Ambulatory Visit: Payer: Self-pay

## 2020-06-13 DIAGNOSIS — Z7901 Long term (current) use of anticoagulants: Secondary | ICD-10-CM

## 2020-06-13 DIAGNOSIS — I482 Chronic atrial fibrillation, unspecified: Secondary | ICD-10-CM

## 2020-06-13 DIAGNOSIS — Z5181 Encounter for therapeutic drug level monitoring: Secondary | ICD-10-CM | POA: Diagnosis not present

## 2020-06-13 DIAGNOSIS — R0602 Shortness of breath: Secondary | ICD-10-CM

## 2020-06-13 DIAGNOSIS — J4520 Mild intermittent asthma, uncomplicated: Secondary | ICD-10-CM

## 2020-06-13 LAB — PULMONARY FUNCTION TEST

## 2020-06-13 LAB — POCT INR: INR: 3.6 — AB (ref 2.0–3.0)

## 2020-06-13 NOTE — Patient Instructions (Signed)
Please STOP warfarin. Don't take any blood thinner for the next 2 days. On Friday morning, start Eliquis 5 mg twice daily.

## 2020-06-13 NOTE — Telephone Encounter (Signed)
Ok thanks 

## 2020-06-13 NOTE — Telephone Encounter (Signed)
Let me know if I need to assist with anything further.

## 2020-06-13 NOTE — Telephone Encounter (Signed)
Patient calling  Patient has just started warfarin medication but her children has just decided that they will pay for Eliquis prescription Please call to clarify transition

## 2020-06-13 NOTE — Telephone Encounter (Signed)
Spoke w/ pt.  She states that her family did not feel comfortable w/ her taking warfarin, so her children will pay for Eliquis. She reports that she still had some Eliquis tablets, so she took 1 this am. Advised her that her INR would need to <2.0 before resuming Eliquis and since it was 3.9 on Monday, I would feel more comfortable knowing what her reading is before making the change. She has appt @ [place] this afternoon @ 2:00 for PFTs, so asked to come over after that I will work her in.  She is appreciative and will come over this afternoon.

## 2020-06-14 ENCOUNTER — Telehealth: Payer: Self-pay

## 2020-06-14 NOTE — Telephone Encounter (Signed)
Confirmed appointment on 06/18/2020 and screened for covid. klh °

## 2020-06-18 ENCOUNTER — Other Ambulatory Visit: Payer: Medicare Other | Admitting: Internal Medicine

## 2020-06-18 ENCOUNTER — Other Ambulatory Visit: Payer: Self-pay

## 2020-06-18 DIAGNOSIS — G4733 Obstructive sleep apnea (adult) (pediatric): Secondary | ICD-10-CM

## 2020-06-18 MED ORDER — APIXABAN 5 MG PO TABS
5.0000 mg | ORAL_TABLET | Freq: Two times a day (BID) | ORAL | 6 refills | Status: DC
Start: 2020-06-18 — End: 2020-11-23

## 2020-06-22 ENCOUNTER — Telehealth: Payer: Self-pay

## 2020-06-22 NOTE — Telephone Encounter (Signed)
Confirmed appointment on 06/27/2020 and screened for covid. kl[facility] 

## 2020-06-26 ENCOUNTER — Ambulatory Visit (INDEPENDENT_AMBULATORY_CARE_PROVIDER_SITE_OTHER): Payer: Medicare Other | Admitting: Gastroenterology

## 2020-06-26 ENCOUNTER — Other Ambulatory Visit: Payer: Self-pay

## 2020-06-26 VITALS — BP 153/83 | HR 83 | Temp 97.9°F | Ht 64.0 in | Wt 277.0 lb

## 2020-06-26 DIAGNOSIS — I6523 Occlusion and stenosis of bilateral carotid arteries: Secondary | ICD-10-CM | POA: Diagnosis not present

## 2020-06-26 DIAGNOSIS — K580 Irritable bowel syndrome with diarrhea: Secondary | ICD-10-CM | POA: Diagnosis not present

## 2020-06-26 NOTE — Progress Notes (Signed)
Laura Bellows MD, MRCP(U.K) [address]  [place]  [place], [place] [address]  Main: [phone]  Fax: [phone]   Primary Care Physician: [name], MD  Primary Gastroenterologist:  Dr. Jonathon [name]   Follow-up for IBS irritable bowel syndrome with diarrhea  HPI: Laura [name] is a 69 y.o. female    Summary of history :  She was initially referred and seen on 01/25/2020 for diarrhea.She has previously been a patient of [facility] gastroenterology. She carries a diagnosis of collagenous colitis noted in 2017. Looking back at colon biopsy results from 2013 the biopsy resultsare notvery specific for collagenous colitis.She was hospitalized in November 2020 and underwent resection of the small bowel due to ischemia. During the surgery she underwent ileum and right colon resection. Had a second look surgery.Shesays that prior to the surgery she was having features of constipation alternating with diarrhea. Since the surgery she has been having only diarrhea up to 10-15 times a day. At times she has accidents and she has to wear a diaper to prevent soiling her undergarments. She denies any use of any artificial sugars, NSAIDs, Metformin. She wishes to transfer care to our care she was on a PPI which has been stopped. Failed treatment with Questran, imodium and CREON  09/18/2019: CT scan of the abdomen shows status post distal small bowel resection with right hemicolectomy and enterocolonic anastomosis.  01/02/2020: Ferritin 25. B12 and folate normal. Hemoglobin 10.7 g in January 2021 with an MCV of 85.  01/27/2020: Fecal calprotectin normal, celiac serology negative. Stool for C. difficile toxin was negative. GI PCR of her stool showed enteroaggregative E. coli positive. Commenced on azithromycin.  03/04/2020: Stool PCR- negative  03/19/2020: Biopsies taken of the terminal ileum as well as colon. No evidence of any form of colitis or inflammation.  Interval  history6/09/2020-06/26/2020   After her last visit commenced her on Xifaxan for 14 days   She feels much better but not completely better.  She attributes it to eating food outside when she went to the beach over the weekend.  Current Outpatient Medications  Medication Sig Dispense Refill  . albuterol (VENTOLIN HFA) 108 (90 Base) MCG/ACT inhaler Inhale 2 puffs into the lungs every 4 (four) hours as needed for wheezing or shortness of breath. 18 g 3  . ALPRAZolam (XANAX) 0.25 MG tablet Take 1 tablet (0.25 mg total) by mouth 2 (two) times daily. 45 tablet 3  . apixaban (ELIQUIS) 5 MG TABS tablet Take 1 tablet (5 mg total) by mouth 2 (two) times daily. 60 tablet 6  . benzonatate (TESSALON) 100 MG capsule Take 1 capsule (100 mg total) by mouth 2 (two) times daily as needed for cough. 20 capsule 0  . budesonide (PULMICORT) 0.25 MG/2ML nebulizer solution Take 2 mLs (0.25 mg total) by nebulization 2 (two) times daily. 60 mL 12  . cholestyramine (QUESTRAN) 4 g packet Take 1 packet (4 g total) by mouth 2 (two) times daily. 60 packet 5  . cyanocobalamin (,VITAMIN B-12,) 1000 MCG/ML injection Inject 1,000 mcg into the muscle every 30 (thirty) days.    . digoxin (LANOXIN) 0.125 MG tablet Take 1 tablet (0.125 mg total) by mouth daily. 90 tablet 1  . diltiazem (CARDIZEM CD) 360 MG 24 hr capsule Take 1 capsule (360 mg total) by mouth daily. 90 capsule 1  . EPINEPHrine 0.3 mg/0.3 mL IJ SOAJ injection Inject 0.3 mg into the muscle as needed for anaphylaxis.     . furosemide (LASIX)  20 MG tablet Take 1 tablet (20 mg total) by mouth daily. 90 tablet 1  . ipratropium-albuterol (DUONEB) 0.5-2.5 (3) MG/3ML SOLN Take 3 mLs by nebulization every 4 (four) hours as needed. 120 mL 3  . levalbuterol (XOPENEX) 0.31 MG/3ML nebulizer solution Take 3 mLs by nebulization every 4 (four) hours as needed. For wheezing/ and or shortness of breath.    . lipase/protease/amylase (CREON) 36000 UNITS CPEP capsule Take 2 capsules with  each meal and 1 with a snack 240 capsule 5  . losartan (COZAAR) 25 MG tablet Take 1 tablet (25 mg total) by mouth daily. 90 tablet 1  . montelukast (SINGULAIR) 10 MG tablet Take 1 tablet (10 mg total) by mouth daily. 90 tablet 1  . Multiple Vitamin (MULTIVITAMIN WITH MINERALS) TABS tablet Take 1 tablet by mouth daily.    . ondansetron (ZOFRAN) 4 MG tablet Take 1 tablet (4 mg total) by mouth every 8 (eight) hours as needed for nausea or vomiting. 60 tablet 1  . predniSONE (DELTASONE) 10 MG tablet Take 1 tablet (10 mg total) by mouth daily with breakfast. 90 tablet 1  . predniSONE (STERAPRED UNI-PAK 48 TAB) 10 MG (48) TBPK tablet 12 day taper - take by mouth as directed for 12 days 48 tablet 0  . Probiotic Product (PROBIOTIC-10) CAPS Take 1 capsule by mouth daily.     . rosuvastatin (CRESTOR) 20 MG tablet Take 1 tablet (20 mg total) by mouth daily. 90 tablet 3  . spironolactone (ALDACTONE) 25 MG tablet Take 1 tablet (25 mg total) by mouth 2 (two) times daily. 180 tablet 1  . theophylline (THEODUR) 300 MG 12 hr tablet Take 1 tablet (300 mg total) by mouth 2 (two) times daily. 180 tablet 1   Current Facility-Administered Medications  Medication Dose Route Frequency Provider Last Rate Last Admin  . cyanocobalamin ((VITAMIN B-12)) injection 1,000 mcg  1,000 mcg Intramuscular Once Ronnell Freshwater, NP        Allergies as of 06/26/2020 - Review Complete 06/07/2020  Allergen Reaction Noted  . Flecainide Shortness Of Breath 11/27/2016  . Metoprolol Shortness Of Breath and Swelling 01/13/2020  . Propafenone Shortness Of Breath and Swelling 12/01/2016  . Rivaroxaban Other (See Comments) 10/23/2016    ROS:  General: Negative for anorexia, weight loss, fever, chills, fatigue, weakness. ENT: Negative for hoarseness, difficulty swallowing , nasal congestion. CV: Negative for chest pain, angina, palpitations, dyspnea on exertion, peripheral edema.  Respiratory: Negative for dyspnea at rest, dyspnea on  exertion, cough, sputum, wheezing.  GI: See history of present illness. GU:  Negative for dysuria, hematuria, urinary incontinence, urinary frequency, nocturnal urination.  Endo: Negative for unusual weight change.    Physical Examination:   LMP  (LMP Unknown)   General: Well-nourished, well-developed in no acute distress.  Eyes: No icterus. Conjunctivae pink. Mouth: Oropharyngeal mucosa moist and pink , no lesions erythema or exudate. Lungs: Clear to auscultation bilaterally. Non-labored. Heart: Regular rate and rhythm, no murmurs rubs or gallops.  Abdomen: Bowel sounds are normal, nontender, nondistended, no hepatosplenomegaly or masses, no abdominal bruits or hernia , no rebound or guarding.   Extremities: No lower extremity edema. No clubbing or deformities. Neuro: Alert and oriented x 3.  Grossly intact. Skin: Warm and dry, no jaundice.   Psych: Alert and cooperative, normal mood and affect.   Imaging Studies: ABI  Result Date: 06/07/2020 LOWER EXTREMITY DOPPLER STUDY Indications: Swelling.  Comparison Study: 06/2019 Performing Technologist: Concha Norway RVT  Examination Guidelines: A complete evaluation  includes at minimum, Doppler waveform signals and systolic blood pressure reading at the level of bilateral brachial, anterior tibial, and posterior tibial arteries, when vessel segments are accessible. Bilateral testing is considered an integral part of a complete examination. Photoelectric Plethysmograph (PPG) waveforms and toe systolic pressure readings are included as required and additional duplex testing as needed. Limited examinations for reoccurring indications may be performed as noted.  ABI Findings: +---------+------------------+-----+---------+--------+ Right    Rt Pressure (mmHg)IndexWaveform Comment  +---------+------------------+-----+---------+--------+ Brachial 150                                      +---------+------------------+-----+---------+--------+  ATA      177               1.18 triphasic         +---------+------------------+-----+---------+--------+ PTA      174               1.16 triphasic         +---------+------------------+-----+---------+--------+ Great Toe115               0.77 Normal            +---------+------------------+-----+---------+--------+ +---------+------------------+-----+---------+-------+ Left     Lt Pressure (mmHg)IndexWaveform Comment +---------+------------------+-----+---------+-------+ Brachial 144                                     +---------+------------------+-----+---------+-------+ ATA      180               1.20 triphasic        +---------+------------------+-----+---------+-------+ PTA      177               1.18 triphasic        +---------+------------------+-----+---------+-------+ Great Toe105               0.70 Normal           +---------+------------------+-----+---------+-------+  Summary: Right: Resting right ankle-brachial index is within normal range. No evidence of significant right lower extremity arterial disease. The right toe-brachial index is normal. Left: Resting left ankle-brachial index is within normal range. No evidence of significant left lower extremity arterial disease. The left toe-brachial index is normal.  *See table(s) above for measurements and observations.  Electronically signed by Hortencia Pilar MD on 06/07/2020 at 5:17:52 PM.   Final    Carotid  Result Date: 06/07/2020 Carotid Arterial Duplex Study Comparison Study:  2018 Performing Technologist: Concha Norway RVT  Examination Guidelines: A complete evaluation includes B-mode imaging, spectral Doppler, color Doppler, and power Doppler as needed of all accessible portions of each vessel. Bilateral testing is considered an integral part of a complete examination. Limited examinations for reoccurring indications may be performed as noted.  Right Carotid Findings:  +----------+--------+--------+--------+------------------+--------+           PSV cm/sEDV cm/sStenosisPlaque DescriptionComments +----------+--------+--------+--------+------------------+--------+ CCA Prox  79      12                                         +----------+--------+--------+--------+------------------+--------+ CCA Mid   89      21                                         +----------+--------+--------+--------+------------------+--------+  CCA Distal76      22                                         +----------+--------+--------+--------+------------------+--------+ ICA Prox  57      13                                         +----------+--------+--------+--------+------------------+--------+ ICA Mid   41      14                                         +----------+--------+--------+--------+------------------+--------+ ICA Distal68      18                                         +----------+--------+--------+--------+------------------+--------+ ECA       51      9                                          +----------+--------+--------+--------+------------------+--------+ +----------+--------+-------+----------------+-------------------+           PSV cm/sEDV cmsDescribe        Arm Pressure (mmHG) +----------+--------+-------+----------------+-------------------+ [id number]             Multiphasic, WNL                    +----------+--------+-------+----------------+-------------------+ +---------+--------+--+--------+---------+ VertebralPSV cm/s42EDV cm/sAntegrade +---------+--------+--+--------+---------+  Left Carotid Findings: +----------+--------+--------+--------+------------------+--------+           PSV cm/sEDV cm/sStenosisPlaque DescriptionComments +----------+--------+--------+--------+------------------+--------+ CCA Prox  59      19                                          +----------+--------+--------+--------+------------------+--------+ CCA Mid   76      15                                         +----------+--------+--------+--------+------------------+--------+ CCA Distal54      14                                         +----------+--------+--------+--------+------------------+--------+ ICA Prox  61      9                                          +----------+--------+--------+--------+------------------+--------+ ICA Mid   59      17                                         +----------+--------+--------+--------+------------------+--------+ ICA  Distal73      20                                         +----------+--------+--------+--------+------------------+--------+ ECA       90      10                                         +----------+--------+--------+--------+------------------+--------+ +----------+--------+--------+----------------+-------------------+           PSV cm/sEDV cm/sDescribe        Arm Pressure (mmHG) +----------+--------+--------+----------------+-------------------+ Subclavian117             Multiphasic, WNL                    +----------+--------+--------+----------------+-------------------+ +---------+--------+--+--------+---------+ VertebralPSV cm/s48EDV cm/sAntegrade +---------+--------+--+--------+---------+   Summary: Right Carotid: There was no evidence of thrombus, dissection, atherosclerotic                plaque or stenosis in the cervical carotid system. Left Carotid: There was no evidence of thrombus, dissection, atherosclerotic               plaque or stenosis in the cervical carotid system. Vertebrals:  Bilateral vertebral arteries demonstrate antegrade flow. Subclavians: Normal flow hemodynamics were seen in bilateral subclavian              arteries. *See table(s) above for measurements and observations.  Electronically signed by Hortencia Pilar MD on 06/07/2020 at 5:17:50 PM.     Final     Assessment and Plan:   Laura [name] is a 69 y.o. y/o female here to follow-upfor diarrhea. She underwent a ileal and right colonic resection in September 2020 following an episode of bowel ischemia. She could be having diarrhea from the ileal resection related to bile salt mediated.Her symptoms are suggestive of IBS-D.  Failed treatment with Imodium, Questran, Creon.  Commenced on Xifaxan last visit and seems to be much better today still has some diarrhea.  She attributes it to the type of food she has been eating over the weekend.   Plan 1.Commence on a low FODMAP diet 2.  Restart Creon samples provided 3.  If no better in 2 to 3 weeks will give a second course of Xifaxan  Dr Laura Bellows  MD,MRCP [facility]) Follow up in 2 to 3 weeks telephone visit

## 2020-06-27 ENCOUNTER — Encounter: Payer: Self-pay | Admitting: Internal Medicine

## 2020-06-27 ENCOUNTER — Ambulatory Visit (INDEPENDENT_AMBULATORY_CARE_PROVIDER_SITE_OTHER): Payer: Medicare Other | Admitting: Internal Medicine

## 2020-06-27 VITALS — BP 130/88 | HR 60 | Temp 97.3°F | Resp 16 | Ht 64.0 in | Wt 273.0 lb

## 2020-06-27 DIAGNOSIS — J4520 Mild intermittent asthma, uncomplicated: Secondary | ICD-10-CM

## 2020-06-27 DIAGNOSIS — G4733 Obstructive sleep apnea (adult) (pediatric): Secondary | ICD-10-CM | POA: Diagnosis not present

## 2020-06-27 DIAGNOSIS — I6523 Occlusion and stenosis of bilateral carotid arteries: Secondary | ICD-10-CM | POA: Diagnosis not present

## 2020-06-27 DIAGNOSIS — J988 Other specified respiratory disorders: Secondary | ICD-10-CM

## 2020-06-27 MED ORDER — AZITHROMYCIN 250 MG PO TABS: ORAL_TABLET | ORAL | 0 refills | Status: DC

## 2020-06-27 NOTE — Progress Notes (Signed)
[facility], [place] [address]  Pulmonary Sleep Medicine   Office Visit Note  Patient Name: Laura [name] DOB: 06/01/1951 MRN [id number]  Date of Service: 06/27/2020  Complaints/HPI: Pt is here to review sleep study.  She currently wears cpap however her machine is not working properly and she needs a new machine.  A baseline PSG was done and shows an overall ahi of 20.  She had more than 10 minutes less than 88% oxygen saturation. She also has a history of asthma that is well controlled at this time.  She is complaining of chest congestion and sob for about 5 days.  She has progressed to coughing, and it is productive today.  ROS  General: (-) fever, (-) chills, (-) night sweats, (-) weakness Skin: (-) rashes, (-) itching,. Eyes: (-) visual changes, (-) redness, (-) itching. Nose and Sinuses: (-) nasal stuffiness or itchiness, (-) postnasal drip, (-) nosebleeds, (-) sinus trouble. Mouth and Throat: (-) sore throat, (-) hoarseness. Neck: (-) swollen glands, (-) enlarged thyroid, (-) neck pain. Respiratory: + cough, (-) bloody sputum, - shortness of breath, + wheezing. Cardiovascular: - ankle swelling, (-) chest pain. Lymphatic: (-) lymph node enlargement. Neurologic: (-) numbness, (-) tingling. Psychiatric: (-) anxiety, (-) depression   Current Medication: Outpatient Encounter Medications as of 06/27/2020  Medication Sig  . albuterol (VENTOLIN HFA) 108 (90 Base) MCG/ACT inhaler Inhale 2 puffs into the lungs every 4 (four) hours as needed for wheezing or shortness of breath.  . ALPRAZolam (XANAX) 0.25 MG tablet Take 1 tablet (0.25 mg total) by mouth 2 (two) times daily.  Marland Kitchen apixaban (ELIQUIS) 5 MG TABS tablet Take 1 tablet (5 mg total) by mouth 2 (two) times daily.  . benzonatate (TESSALON) 100 MG capsule Take 1 capsule (100 mg total) by mouth 2 (two) times daily as needed for cough.  . budesonide (PULMICORT) 0.25 MG/2ML nebulizer solution Take 2 mLs  (0.25 mg total) by nebulization 2 (two) times daily.  . cholestyramine (QUESTRAN) 4 g packet Take 1 packet (4 g total) by mouth 2 (two) times daily.  . cyanocobalamin (,VITAMIN B-12,) 1000 MCG/ML injection Inject 1,000 mcg into the muscle every 30 (thirty) days.  . digoxin (LANOXIN) 0.125 MG tablet Take 1 tablet (0.125 mg total) by mouth daily.  Marland Kitchen diltiazem (CARDIZEM CD) 360 MG 24 hr capsule Take 1 capsule (360 mg total) by mouth daily.  Marland Kitchen EPINEPHrine 0.3 mg/0.3 mL IJ SOAJ injection Inject 0.3 mg into the muscle as needed for anaphylaxis.   . furosemide (LASIX) 20 MG tablet Take 1 tablet (20 mg total) by mouth daily.  Marland Kitchen ipratropium-albuterol (DUONEB) 0.5-2.5 (3) MG/3ML SOLN Take 3 mLs by nebulization every 4 (four) hours as needed.  . levalbuterol (XOPENEX) 0.31 MG/3ML nebulizer solution Take 3 mLs by nebulization every 4 (four) hours as needed. For wheezing/ and or shortness of breath.  . lipase/protease/amylase (CREON) 36000 UNITS CPEP capsule Take 2 capsules with each meal and 1 with a snack  . losartan (COZAAR) 25 MG tablet Take 1 tablet (25 mg total) by mouth daily.  . montelukast (SINGULAIR) 10 MG tablet Take 1 tablet (10 mg total) by mouth daily.  . Multiple Vitamin (MULTIVITAMIN WITH MINERALS) TABS tablet Take 1 tablet by mouth daily.  . ondansetron (ZOFRAN) 4 MG tablet Take 1 tablet (4 mg total) by mouth every 8 (eight) hours as needed for nausea or vomiting.  . predniSONE (DELTASONE) 10 MG tablet Take 1 tablet (10 mg total) by  mouth daily with breakfast.  . predniSONE (STERAPRED UNI-PAK 48 TAB) 10 MG (48) TBPK tablet 12 day taper - take by mouth as directed for 12 days  . Probiotic Product (PROBIOTIC-10) CAPS Take 1 capsule by mouth daily.   Marland Kitchen spironolactone (ALDACTONE) 25 MG tablet Take 1 tablet (25 mg total) by mouth 2 (two) times daily.  . theophylline (THEODUR) 300 MG 12 hr tablet Take 1 tablet (300 mg total) by mouth 2 (two) times daily.  . rosuvastatin (CRESTOR) 20 MG tablet Take 1  tablet (20 mg total) by mouth daily.   Facility-Administered Encounter Medications as of 06/27/2020  Medication Note  . cyanocobalamin ((VITAMIN B-12)) injection 1,000 mcg 02/27/2020: Due 02/28/2020    Surgical History: Past Surgical History:  Procedure Laterality Date  . BOWEL RESECTION  09/11/2019   Procedure: SMALL BOWEL RESECTION;  Surgeon: Herbert Pun, MD;  Location: [facility] ORS;  Service: General;;  . CARDIAC CATHETERIZATION    . cataract surgery    . COLONOSCOPY WITH PROPOFOL N/A 03/19/2020   Procedure: COLONOSCOPY WITH PROPOFOL;  Surgeon: [name], MD;  Location: [facility];  Service: Gastroenterology;  Laterality: N/A;  . CORONARY ANGIOPLASTY    . INCISION AND DRAINAGE ABSCESS Right 06/29/2016   Procedure: INCISION AND DRAINAGE ABSCESS;  Surgeon: Florene Glen, MD;  Location: [facility] ORS;  Service: General;  Laterality: Right;  . INCISION AND DRAINAGE OF WOUND Left 06/29/2016   Procedure: IRRIGATION AND DEBRIDEMENT WOUND;  Surgeon: Florene Glen, MD;  Location: [facility] OR[facility];  Service: General;  Laterality: Left;  . LAPAROSCOPIC RIGHT COLECTOMY  09/11/2019   Procedure: RIGHT COLECTOMY;  Surgeon: Herbert Pun, MD;  Location: [facility] OR[facility];  Service: General;;  . LAPAROSCOPY N/A 09/11/2019   Procedure: LAPAROSCOPY DIAGNOSTIC;  Surgeon: Herbert Pun, MD;  Location: [facility] OR[facility];  Service: General;  Laterality: N/A;  . LAPAROTOMY N/A 09/13/2019   Procedure: REOPENING OF RECENT LAPAROTOMYANASTOMOSIS OF BOWEL;  Surgeon: Herbert Pun, MD;  Location: [facility];  Service: General;  Laterality: N/A;  . RIGHT/LEFT HEART CATH AND CORONARY ANGIOGRAPHY Bilateral 02/27/2020   Procedure: RIGHT/LEFT HEART CATH AND CORONARY ANGIOGRAPHY;  Surgeon: Wellington Hampshire, MD;  Location: [facility];  Service: Cardiovascular;  Laterality: Bilateral;  . VISCERAL ANGIOGRAPHY N/A 09/12/2019   Procedure: VISCERAL ANGIOGRAPHY;  Surgeon: Algernon Huxley, MD;  Location: [facility];   Service: Cardiovascular;  Laterality: N/A;    Medical History: Past Medical History:  Diagnosis Date  . Asthma   . Chronic atrial fibrillation ([place])    a. diagnosed in 09/2016; b. failed flecainide and propafenone due to LE swelling and SOB, could not afford Multaq; c. CHADS2VASc => 5 (CHF, HTN, age x 1, nonobs CAD, female)--> Eliquis  . COPD (chronic obstructive pulmonary disease) ([place])   . GERD (gastroesophageal reflux disease)   . HFmrEF (heart failure with mid-range ejection fraction) ([place])    a. 12/2019 Echo: EF 40-45%.  . Hyperlipidemia   . Hypertension   . NICM (nonischemic cardiomyopathy) ([place])    a. 12/2019 Echo: EF 40-45%, glob HK, mildly reduced RV fxn, sev dil LA. *HR 130 (afib) during study.  . Nonobstructive CAD (coronary artery disease)    a. Lexiscan Myoview 10/2016: no evidence of ischemia, EF 53%; b. 02/2020 Cath: LM nl, LAD 20p, 50m, LCX 20ost, OM1/2/3 nl, LPDA nl, LPL1/2 nl, LPAV nl, RCA small, nl.  . Obesity   . Obstructive sleep apnea   . Pulmonary hypertension ([place])   . Systolic dysfunction  a. TTE 10/2016: EF 50%, mild LVH, moderately dilated LA, moderate MR/TR, mild pulmonary hypertension    Family History: Family History  Problem Relation Age of Onset  . Dementia Mother   . Osteoporosis Mother   . Vascular Disease Mother   . COPD Father     Social History: Social History   Socioeconomic History  . Marital status: Married    Spouse name: [name]   . Number of children: 2  . Years of education: Not on file  . Highest education level: Not on file  Occupational History  . Not on file  Tobacco Use  . Smoking status: Never Smoker  . Smokeless tobacco: Never Used  Vaping Use  . Vaping Use: Never used  Substance and Sexual Activity  . Alcohol use: No  . Drug use: No  . Sexual activity: Not on file  Other Topics Concern  . Not on file  Social History Narrative   3 grandchildren: 7 great grandkids.    Social Determinants of Health    Financial Resource Strain:   . Difficulty of Paying Living Expenses:   Food Insecurity:   . Worried About Charity fundraiser in the Last Year:   . Arboriculturist in the Last Year:   Transportation Needs:   . Film/video editor (Medical):   Marland Kitchen Lack of Transportation (Non-Medical):   Physical Activity:   . Days of Exercise per Week:   . Minutes of Exercise per Session:   Stress:   . Feeling of Stress :   Social Connections:   . Frequency of Communication with Friends and Family:   . Frequency of Social Gatherings with Friends and Family:   . Attends Religious Services:   . Active Member of Clubs or Organizations:   . Attends Archivist Meetings:   Marland Kitchen Marital Status:   Intimate Partner Violence:   . Fear of Current or Ex-Partner:   . Emotionally Abused:   Marland Kitchen Physically Abused:   . Sexually Abused:     Vital Signs: Blood pressure 130/88, pulse 60, temperature (!) 97.3 F (36.3 C), resp. rate 16, height 5\' 4"  (1.626 m), weight 273 lb (123.8 kg), SpO2 97 %.  Examination: General Appearance: The patient is well-developed, well-nourished, and in no distress. Skin: Gross inspection of skin unremarkable. Head: normocephalic, no gross deformities. Eyes: no gross deformities noted. ENT: ears appear grossly normal no exudates. Neck: Supple. No thyromegaly. No LAD. Respiratory: faint wheez RLL Cardiovascular: Normal S1 and S2 without murmur or rub. Extremities: No cyanosis. pulses are equal. Neurologic: Alert and oriented. No involuntary movements.  LABS: Recent Results (from the past 2160 hour(s))  Comprehensive metabolic panel     Status: Abnormal   Collection Time: 05/16/20 10:30 AM  Result Value Ref Range   Sodium 137 135 - 145 mmol/L   Potassium 4.3 3.5 - 5.1 mmol/L   Chloride 101 98 - 111 mmol/L   CO2 27 22 - 32 mmol/L   Glucose, Bld 119 (H) 70 - 99 mg/dL    Comment: Glucose reference range applies only to samples taken after fasting for at least 8 hours.    BUN 14 8 - 23 mg/dL   Creatinine, Ser 0.71 0.44 - 1.00 mg/dL   Calcium 9.0 8.9 - 10.3 mg/dL   Total Protein 6.7 6.5 - 8.1 g/dL   Albumin 3.9 3.5 - 5.0 g/dL   AST 20 15 - 41 U/L   ALT 19 0 - 44 U/L  Alkaline Phosphatase 56 38 - 126 U/L   Total Bilirubin 1.0 0.3 - 1.2 mg/dL   GFR calc non Af Amer >60 >60 mL/min   GFR calc Af Amer >60 >60 mL/min   Anion gap 9 5 - 15    Comment: Performed at [facility], [address] [place], [place] [address]  Lipid panel     Status: None   Collection Time: 05/16/20 10:30 AM  Result Value Ref Range   Cholesterol 136 0 - 200 mg/dL   Triglycerides 147 <150 mg/dL   HDL 66 >40 mg/dL   Total CHOL/HDL Ratio 2.1 RATIO   VLDL 29 0 - 40 mg/dL   LDL Cholesterol 41 0 - 99 mg/dL    Comment:        Total Cholesterol/HDL:CHD Risk Coronary Heart Disease Risk Table                     Men   Women  1/2 Average Risk   3.4   3.3  Average Risk       5.0   4.4  2 X Average Risk   9.6   7.1  3 X Average Risk  23.4   11.0        Use the calculated Patient Ratio above and the CHD Risk Table to determine the patient's CHD Risk.        ATP III CLASSIFICATION (LDL):  <100     mg/dL   Optimal  100-129  mg/dL   Near or Above                    Optimal  130-159  mg/dL   Borderline  160-189  mg/dL   High  >190     mg/dL   Very High Performed at [facility], [address] [place], [place] [address]   POCT INR     Status: Abnormal   Collection Time: 06/11/20 11:49 AM  Result Value Ref Range   INR 3.9 (A) 2.0 - 3.0  POCT INR     Status: Abnormal   Collection Time: 06/13/20  2:33 PM  Result Value Ref Range   INR 3.6 (A) 2.0 - 3.0    Radiology: No results found.  No results found.  ABI  Result Date: 06/07/2020 LOWER EXTREMITY DOPPLER STUDY Indications: Swelling.  Comparison Study: 06/2019 Performing Technologist: Concha Norway RVT  Examination Guidelines: A complete evaluation includes at minimum, Doppler waveform signals and  systolic blood pressure reading at the level of bilateral brachial, anterior tibial, and posterior tibial arteries, when vessel segments are accessible. Bilateral testing is considered an integral part of a complete examination. Photoelectric Plethysmograph (PPG) waveforms and toe systolic pressure readings are included as required and additional duplex testing as needed. Limited examinations for reoccurring indications may be performed as noted.  ABI Findings: +---------+------------------+-----+---------+--------+ Right    Rt Pressure (mmHg)IndexWaveform Comment  +---------+------------------+-----+---------+--------+ Brachial 150                                      +---------+------------------+-----+---------+--------+ ATA      177               1.18 triphasic         +---------+------------------+-----+---------+--------+ PTA      174               1.16 triphasic         +---------+------------------+-----+---------+--------+  Great Toe115               0.77 Normal            +---------+------------------+-----+---------+--------+ +---------+------------------+-----+---------+-------+ Left     Lt Pressure (mmHg)IndexWaveform Comment +---------+------------------+-----+---------+-------+ Brachial 144                                     +---------+------------------+-----+---------+-------+ ATA      180               1.20 triphasic        +---------+------------------+-----+---------+-------+ PTA      177               1.18 triphasic        +---------+------------------+-----+---------+-------+ Great Toe105               0.70 Normal           +---------+------------------+-----+---------+-------+  Summary: Right: Resting right ankle-brachial index is within normal range. No evidence of significant right lower extremity arterial disease. The right toe-brachial index is normal. Left: Resting left ankle-brachial index is within normal range. No evidence of  significant left lower extremity arterial disease. The left toe-brachial index is normal.  *See table(s) above for measurements and observations.  Electronically signed by Hortencia Pilar MD on 06/07/2020 at 5:17:52 PM.   Final    Carotid  Result Date: 06/07/2020 Carotid Arterial Duplex Study Comparison Study:  2018 Performing Technologist: Concha Norway RVT  Examination Guidelines: A complete evaluation includes B-mode imaging, spectral Doppler, color Doppler, and power Doppler as needed of all accessible portions of each vessel. Bilateral testing is considered an integral part of a complete examination. Limited examinations for reoccurring indications may be performed as noted.  Right Carotid Findings: +----------+--------+--------+--------+------------------+--------+           PSV cm/sEDV cm/sStenosisPlaque DescriptionComments +----------+--------+--------+--------+------------------+--------+ CCA Prox  79      12                                         +----------+--------+--------+--------+------------------+--------+ CCA Mid   89      21                                         +----------+--------+--------+--------+------------------+--------+ CCA Distal76      22                                         +----------+--------+--------+--------+------------------+--------+ ICA Prox  57      13                                         +----------+--------+--------+--------+------------------+--------+ ICA Mid   41      14                                         +----------+--------+--------+--------+------------------+--------+ ICA Distal68      18                                         +----------+--------+--------+--------+------------------+--------+  ECA       51      9                                          +----------+--------+--------+--------+------------------+--------+ +----------+--------+-------+----------------+-------------------+            PSV cm/sEDV cmsDescribe        Arm Pressure (mmHG) +----------+--------+-------+----------------+-------------------+ [id number]             Multiphasic, WNL                    +----------+--------+-------+----------------+-------------------+ +---------+--------+--+--------+---------+ VertebralPSV cm/s42EDV cm/sAntegrade +---------+--------+--+--------+---------+  Left Carotid Findings: +----------+--------+--------+--------+------------------+--------+           PSV cm/sEDV cm/sStenosisPlaque DescriptionComments +----------+--------+--------+--------+------------------+--------+ CCA Prox  59      19                                         +----------+--------+--------+--------+------------------+--------+ CCA Mid   76      15                                         +----------+--------+--------+--------+------------------+--------+ CCA Distal54      14                                         +----------+--------+--------+--------+------------------+--------+ ICA Prox  61      9                                          +----------+--------+--------+--------+------------------+--------+ ICA Mid   59      17                                         +----------+--------+--------+--------+------------------+--------+ ICA Distal73      20                                         +----------+--------+--------+--------+------------------+--------+ ECA       90      10                                         +----------+--------+--------+--------+------------------+--------+ +----------+--------+--------+----------------+-------------------+           PSV cm/sEDV cm/sDescribe        Arm Pressure (mmHG) +----------+--------+--------+----------------+-------------------+ Subclavian117             Multiphasic, WNL                    +----------+--------+--------+----------------+-------------------+ +---------+--------+--+--------+---------+  VertebralPSV cm/s48EDV cm/sAntegrade +---------+--------+--+--------+---------+   Summary: Right Carotid: There was no evidence of thrombus, dissection, atherosclerotic                plaque or stenosis in  the cervical carotid system. Left Carotid: There was no evidence of thrombus, dissection, atherosclerotic               plaque or stenosis in the cervical carotid system. Vertebrals:  Bilateral vertebral arteries demonstrate antegrade flow. Subclavians: Normal flow hemodynamics were seen in bilateral subclavian              arteries. *See table(s) above for measurements and observations.  Electronically signed by Hortencia Pilar MD on 06/07/2020 at 5:17:50 PM.    Final       Assessment and Plan: Patient Active Problem List   Diagnosis Date Noted  . Lymphedema 06/07/2020  . Acute right-sided low back pain with right-sided sciatica 05/21/2020  . Chronic systolic heart failure ([place])   . Pulmonary hypertension, unspecified ([place])   . Nutritional deficiency 01/02/2020  . Memory deficit 01/02/2020  . Encounter for therapeutic drug level monitoring 01/02/2020  . B12 deficiency 01/02/2020  . PAD (peripheral artery disease) ([place]) 12/05/2019  . Intractable vomiting 10/09/2019  . Intestinal ischemia ([place])   . COPD with acute exacerbation ([place])   . Atrial fibrillation with rapid ventricular response ([place]) 09/11/2019  . Enteritis   . Peripheral edema 07/17/2019  . Atherosclerosis of both carotid arteries 07/17/2019  . Varicose veins of right lower extremity with inflammation 06/08/2019  . Gastroesophageal reflux disease with esophagitis 05/08/2019  . Fall at home, initial encounter 03/16/2019  . Contusion of right lower leg 03/16/2019  . Flu-like symptoms 02/03/2019  . Nausea 02/03/2019  . Suprapatellar bursitis of right knee 01/23/2019  . Pain in right leg 01/23/2019  . Cellulitis of right leg 01/23/2019  . Chronic venous stasis dermatitis of right lower extremity 01/23/2019  .  Oropharyngeal candidiasis 01/23/2019  . Cellulitis of head except face 12/23/2018  . Screening for breast cancer 08/15/2018  . Chronic bronchitis with acute exacerbation ([place]) 08/15/2018  . Vitamin D deficiency 08/15/2018  . Need for vaccination against Streptococcus pneumoniae using pneumococcal conjugate vaccine 13 08/15/2018  . Obstructive chronic bronchitis without exacerbation ([place]) 07/14/2018  . Body mass index (BMI) 45.0-49.9, adult 03/17/2018  . Asthma 02/25/2018  . Generalized anxiety disorder 12/16/2017  . Essential hypertension 12/16/2017  . Chronic respiratory failure with hypoxia ([place]) 12/16/2017  . Long term (current) use of anticoagulants 12/16/2017  . Chronic atrial fibrillation ([place]) 12/16/2017  . Other specified functional intestinal disorders 12/16/2017  . Gastro-esophageal reflux disease without esophagitis 12/16/2017  . OSA on CPAP 12/16/2017  . Allergic rhinitis due to animal (cat) (dog) hair and dander 12/16/2017  . Allergic rhinitis due to pollen 12/16/2017  . Severe persistent asthma without complication 59/93/5701  . Cough 12/16/2017  . SOB (shortness of breath) 12/16/2017  . Superficial thrombophlebitis of right leg 11/17/2016  . Pain in limb 11/17/2016  . Chronic venous insufficiency 11/17/2016  . Swelling of limb 11/17/2016  . Cellulitis of buttock   . Cellulitis of right axilla   . Sepsis ([place]) 06/28/2016  . Cellulitis and abscess 06/28/2016  . Hypokalemia 06/28/2016  . Acute renal insufficiency 06/28/2016  . Hyperglycemia 06/28/2016  . Leukocytosis 06/28/2016  . Hypoxia 06/28/2016  . Collagenous colitis 05/13/2016    1. OSA (obstructive sleep apnea) New cpap ordered for patient.  - For home use only DME continuous positive airway pressure (CPAP) she still has significant obstructive sleep apnea and so therefore will place her on CPAP because her old machine was not working she will get an auto titrating device.  2. Mild intermittent asthma  without complication Stable,continue current management.  Seems to be under control we will continue with supportive care  3. Respiratory infection Advised patient to take entire course of antibiotics as prescribed with food. Pt should return to clinic in 7-10 days if symptoms fail to improve or new symptoms develop.  - azithromycin (ZITHROMAX) 250 MG tablet; Take as directed  Dispense: 6 tablet; Refill: 0  #4.  Obesity once again diet exercise strongly recommended.  General Counseling: I have discussed the findings of the evaluation and examination with [name].  I have also discussed any further diagnostic evaluation thatmay be needed or ordered today. [place] verbalizes understanding of the findings of todays visit. We also reviewed her medications today and discussed drug interactions and side effects including but not limited excessive drowsiness and altered mental states. We also discussed that there is always a risk not just to her but also people around her. she has been encouraged to call the office with any questions or concerns that should arise related to todays visit.  Orders Placed This Encounter  Procedures  . For home use only DME continuous positive airway pressure (CPAP)    Order Specific Question:   Length of Need    Answer:   Lifetime    Order Specific Question:   Patient has OSA or probable OSA    Answer:   Yes    Order Specific Question:   Settings    Answer:   65-15    Order Specific Question:   CPAP supplies needed    Answer:   Mask, headgear, cushions, filters, heated tubing and water chamber     Time spent: 30 This patient was seen by Orson Gear AGNP-C in Collaboration with Dr. Devona Konig as a part of collaborative care agreement.   I have personally obtained a history, examined the patient, evaluated laboratory and imaging results, formulated the assessment and plan and placed orders.    Allyne Gee, MD [facility] Pulmonary and Critical Care Sleep  medicine

## 2020-06-28 NOTE — Procedures (Signed)
[facility] [place], [place] [address]  Sleep Specialist: Allyne Gee, MD [place] Sleep Study Interpretation  Patient Name: Laura [name] Patient MR [id number] DOB:05-20-1951  Date of Study: June 18, 2020  Indications for study: Hypersomnia sleep apnea  BMI: 46.1 kg/m       Respiratory Data:  Total AHI: 20.0/h  Total Obstructive Apneas: 29  Total Central Apneas: 15  Total Mixed Apneas: 1  Total Hypopneas: 108  If the AHI is greater than 5 per hour patient qualifies for PAP evaluation  Oximetry Data:  Oxygen Desaturation Index: 21.6  Lowest Desaturation: 75%  Cardiac Data:  Minimum Heart Rate: 41  Maximum Heart Rate: 200   Impression / Diagnosis:  This apnea study is consistent with moderate obstructive sleep apnea.  Patient has severe significant oxygen desaturation.  There also does appear to be some arrhythmias in the form of tachycardia bradycardia clinical correlation is strongly recommended.  Further evaluation by CPAP titration is suggested in addition patient should have consideration made for Holter evaluation  GENERAL Recommendations:  1.  Consider Auto PAP with pressure ranges 5-20 cmH20 with download, or facility based PAP Titration Study  2.  Consider PAP interface mask fitted for patient comfort, Heated Humidification & PAP compliance monitoring (1 month, 3 months & 12 months after PAP initiation)  3. Consider treatment with mandibular advancement splint (MAS) or referral to an ENT surgeon for modification to the upper airway if the patient prefers an alternate therapy or the PAP trial is unsuccessful  4. Sleep hygiene measures should be discussed with the patient  5. Behavioral therapy such as weight reduction or smoking cessation as appropriate for the patient  6. Advise patient against the use of alcohol or sedatives in so much as these substances can worsen excessive daytime sleepiness and  respiratory disturbances of sleep  7. Advise patient against participating in potentially dangerous activities while drowsy such as operating a motor vehicle, heavy equipment or power tools as it can put them and others in danger  8. Advise patient of the long term consequences of OSA if left untreated, need for treatment and close follow up  9. Clinical follow up as deemed necessary     This Level III home sleep study was performed using the [employer], a 4 channel screening device subject to limitations. Depending on actual total sleep time, not measured in this study, the AHI (sum of apneas and hypopneas/hr of sleep) and therefore the severity of sleep apnea may be underestimated. As with any single night study, including Level 1 attended PSG, severity of sleep apnea may also be underestimated due to the lack of supine and/or REM sleep.  The interpretation associated with this report is based on normal values and degrees of severity in accordance with AASM parameters and/or estimated from multiple sources in the literature for adults ages 54-80+. These may not agree with the displayed values. The patient's treating physician should use the interpretation and recommendations in conjunction with the overall clinical evaluation and treatment of the patient.  Some of the terminology used in this scored [facility]nea[facility] report was developed several years ago and may not always be in accordance with current nomenclature. This in no way affects the accuracy of the data or the reliability of the interpretation and recommendations.

## 2020-06-28 NOTE — Procedures (Signed)
[facility] [place], [address]  DATE OF SERVICE: June 13, 2020  Complete Pulmonary Function Testing Interpretation:  FINDINGS:  Forced vital capacity is mildly decreased.  FEV1 is 1.55 L which is 70% of predicted and is mildly decreased.  Postbronchodilator there does not appear to be significant response.  FEV1 FVC ratio is mildly decreased.  Total lung capacity is normal residual volume is normal residual internal capacity ratio is increased.  FRC is decreased.  DLCO is normal.  IMPRESSION:  This pulmonary function study is consistent with mild obstructive lung disease.  There does not appear to be significant response to bronchodilators.  Clinical correlation is recommended  Allyne Gee, MD [facility] Pulmonary Critical Care Medicine Sleep Medicine

## 2020-07-02 ENCOUNTER — Other Ambulatory Visit: Payer: Self-pay

## 2020-07-02 ENCOUNTER — Telehealth: Payer: Self-pay

## 2020-07-02 DIAGNOSIS — R112 Nausea with vomiting, unspecified: Secondary | ICD-10-CM

## 2020-07-02 MED ORDER — ONDANSETRON HCL 4 MG PO TABS: 4.0000 mg | ORAL_TABLET | Freq: Three times a day (TID) | ORAL | 1 refills | Status: DC | PRN

## 2020-07-02 NOTE — Telephone Encounter (Signed)
Gave RX for cpap set up to [facility]. Beth °

## 2020-07-18 ENCOUNTER — Ambulatory Visit (INDEPENDENT_AMBULATORY_CARE_PROVIDER_SITE_OTHER): Payer: Medicare Other

## 2020-07-18 ENCOUNTER — Other Ambulatory Visit: Payer: Self-pay

## 2020-07-18 DIAGNOSIS — G4733 Obstructive sleep apnea (adult) (pediatric): Secondary | ICD-10-CM

## 2020-07-18 DIAGNOSIS — E538 Deficiency of other specified B group vitamins: Secondary | ICD-10-CM | POA: Diagnosis not present

## 2020-07-18 MED ORDER — CYANOCOBALAMIN 1000 MCG/ML IJ SOLN
1000.0000 ug | Freq: Once | INTRAMUSCULAR | Status: AC
  Administered 2020-07-18: 1000 ug via INTRAMUSCULAR

## 2020-07-18 NOTE — Progress Notes (Signed)
New Cpap Setup  Laura [name] was setup on resmed airsense S-10 Cpap at 14 cmH2o with a heated humidifier, climate line and a resmed F-20 full face mask size small. She had a good understanding of using and cleaning cpap. We discussed not to use the so clean sanitizer per resmed it will void parts of the warranty.

## 2020-07-20 ENCOUNTER — Telehealth: Payer: Self-pay

## 2020-07-20 NOTE — Telephone Encounter (Signed)
Confirmed and screened for 07-24-20 ov. 

## 2020-07-24 ENCOUNTER — Other Ambulatory Visit: Payer: Self-pay

## 2020-07-24 ENCOUNTER — Ambulatory Visit (INDEPENDENT_AMBULATORY_CARE_PROVIDER_SITE_OTHER): Payer: Medicare Other | Admitting: Hospice and Palliative Medicine

## 2020-07-24 ENCOUNTER — Encounter: Payer: Self-pay | Admitting: Nurse Practitioner

## 2020-07-24 DIAGNOSIS — Z0001 Encounter for general adult medical examination with abnormal findings: Secondary | ICD-10-CM | POA: Diagnosis not present

## 2020-07-24 DIAGNOSIS — I6523 Occlusion and stenosis of bilateral carotid arteries: Secondary | ICD-10-CM | POA: Diagnosis not present

## 2020-07-24 DIAGNOSIS — R300 Dysuria: Secondary | ICD-10-CM

## 2020-07-24 DIAGNOSIS — I482 Chronic atrial fibrillation, unspecified: Secondary | ICD-10-CM | POA: Diagnosis not present

## 2020-07-24 DIAGNOSIS — I495 Sick sinus syndrome: Secondary | ICD-10-CM

## 2020-07-24 NOTE — Progress Notes (Addendum)
[facility] [place], [place] [address]  Internal MEDICINE  Office Visit Note  Patient Name: Laura [name]  [id number]  [id number]  Date of Service: 07/30/2020  Chief Complaint  Patient presents with  . Medicare Wellness  . Hypertension  . Hyperlipidemia  . Gastroesophageal Reflux  . COPD  . Quality Metric Gaps    covid-19 vaccine    HPI Pt is here for routine health maintenance examination. She has an extensive history of abdominal complications and surgeries. Was hospitalized earlier this year for complications. Routinely followed by Dr. Vicente Males for management. Had colonoscopy while hospitalized earlier this year. Was started on Creon and Questran by GI. BP stable today Followed closely by cardiology for HF and history of A. Fib, both stable at this time Followed by vascular for PAD as well as mild atherosclerosis of carotid arteries, no surgical interventions warranted at this time She states she feels as though she is on top of all of her health issues at this time. Has no acute complaints to discuss today.  Current Medication: Outpatient Encounter Medications as of 07/24/2020  Medication Sig  . albuterol (VENTOLIN HFA) 108 (90 Base) MCG/ACT inhaler Inhale 2 puffs into the lungs every 4 (four) hours as needed for wheezing or shortness of breath.  . ALPRAZolam (XANAX) 0.25 MG tablet Take 1 tablet (0.25 mg total) by mouth 2 (two) times daily.  Marland Kitchen apixaban (ELIQUIS) 5 MG TABS tablet Take 1 tablet (5 mg total) by mouth 2 (two) times daily.  Marland Kitchen azithromycin (ZITHROMAX) 250 MG tablet Take as directed  . benzonatate (TESSALON) 100 MG capsule Take 1 capsule (100 mg total) by mouth 2 (two) times daily as needed for cough.  . budesonide (PULMICORT) 0.25 MG/2ML nebulizer solution Take 2 mLs (0.25 mg total) by nebulization 2 (two) times daily.  . cyanocobalamin (,VITAMIN B-12,) 1000 MCG/ML injection Inject 1,000 mcg into the muscle every 30 (thirty) days.  .  digoxin (LANOXIN) 0.125 MG tablet Take 1 tablet (0.125 mg total) by mouth daily.  Marland Kitchen diltiazem (CARDIZEM CD) 360 MG 24 hr capsule Take 1 capsule (360 mg total) by mouth daily.  Marland Kitchen EPINEPHrine 0.3 mg/0.3 mL IJ SOAJ injection Inject 0.3 mg into the muscle as needed for anaphylaxis.   . furosemide (LASIX) 20 MG tablet Take 1 tablet (20 mg total) by mouth daily.  Marland Kitchen ipratropium-albuterol (DUONEB) 0.5-2.5 (3) MG/3ML SOLN Take 3 mLs by nebulization every 4 (four) hours as needed.  . levalbuterol (XOPENEX) 0.31 MG/3ML nebulizer solution Take 3 mLs by nebulization every 4 (four) hours as needed. For wheezing/ and or shortness of breath.  . lipase/protease/amylase (CREON) 36000 UNITS CPEP capsule Take 2 capsules with each meal and 1 with a snack  . losartan (COZAAR) 25 MG tablet Take 1 tablet (25 mg total) by mouth daily.  . montelukast (SINGULAIR) 10 MG tablet Take 1 tablet (10 mg total) by mouth daily.  . Multiple Vitamin (MULTIVITAMIN WITH MINERALS) TABS tablet Take 1 tablet by mouth daily.  . ondansetron (ZOFRAN) 4 MG tablet Take 1 tablet (4 mg total) by mouth every 8 (eight) hours as needed for nausea or vomiting.  . predniSONE (DELTASONE) 10 MG tablet Take 1 tablet (10 mg total) by mouth daily with breakfast.  . predniSONE (STERAPRED UNI-PAK 48 TAB) 10 MG (48) TBPK tablet 12 day taper - take by mouth as directed for 12 days  . Probiotic Product (PROBIOTIC-10) CAPS Take 1 capsule by mouth daily.   Marland Kitchen spironolactone (ALDACTONE) 25  MG tablet Take 1 tablet (25 mg total) by mouth 2 (two) times daily.  . theophylline (THEODUR) 300 MG 12 hr tablet Take 1 tablet (300 mg total) by mouth 2 (two) times daily.  . cholestyramine (QUESTRAN) 4 g packet Take 1 packet (4 g total) by mouth 2 (two) times daily.  . rosuvastatin (CRESTOR) 20 MG tablet Take 1 tablet (20 mg total) by mouth daily.   Facility-Administered Encounter Medications as of 07/24/2020  Medication Note  . cyanocobalamin ((VITAMIN B-12)) injection 1,000  mcg 02/27/2020: Due 02/28/2020    Surgical History: Past Surgical History:  Procedure Laterality Date  . BOWEL RESECTION  09/11/2019   Procedure: SMALL BOWEL RESECTION;  Surgeon: Herbert Pun, MD;  Location: [facility] ORS;  Service: General;;  . CARDIAC CATHETERIZATION    . cataract surgery    . COLONOSCOPY WITH PROPOFOL N/A 03/19/2020   Procedure: COLONOSCOPY WITH PROPOFOL;  Surgeon: [name], MD;  Location: [facility] ENDOSCOPY;  Service: Gastroenterology;  Laterality: N/A;  . CORONARY ANGIOPLASTY    . INCISION AND DRAINAGE ABSCESS Right 06/29/2016   Procedure: INCISION AND DRAINAGE ABSCESS;  Surgeon: Florene Glen, MD;  Location: [facility] ORS;  Service: General;  Laterality: Right;  . INCISION AND DRAINAGE OF WOUND Left 06/29/2016   Procedure: IRRIGATION AND DEBRIDEMENT WOUND;  Surgeon: Florene Glen, MD;  Location: [facility] ORS;  Service: General;  Laterality: Left;  . LAPAROSCOPIC RIGHT COLECTOMY  09/11/2019   Procedure: RIGHT COLECTOMY;  Surgeon: Herbert Pun, MD;  Location: [facility] ORS;  Service: General;;  . LAPAROSCOPY N/A 09/11/2019   Procedure: LAPAROSCOPY DIAGNOSTIC;  Surgeon: Herbert Pun, MD;  Location: [facility] ORS;  Service: General;  Laterality: N/A;  . LAPAROTOMY N/A 09/13/2019   Procedure: REOPENING OF RECENT LAPAROTOMYANASTOMOSIS OF BOWEL;  Surgeon: Herbert Pun, MD;  Location: [facility] ORS;  Service: General;  Laterality: N/A;  . RIGHT/LEFT HEART CATH AND CORONARY ANGIOGRAPHY Bilateral 02/27/2020   Procedure: RIGHT/LEFT HEART CATH AND CORONARY ANGIOGRAPHY;  Surgeon: Wellington Hampshire, MD;  Location: [place] CV LAB;  Service: Cardiovascular;  Laterality: Bilateral;  . VISCERAL ANGIOGRAPHY N/A 09/12/2019   Procedure: VISCERAL ANGIOGRAPHY;  Surgeon: Algernon Huxley, MD;  Location: [place] CV LAB;  Service: Cardiovascular;  Laterality: N/A;    Medical History: Past Medical History:  Diagnosis Date  . Asthma   . Chronic atrial fibrillation ([place])    a.  diagnosed in 09/2016; b. failed flecainide and propafenone due to LE swelling and SOB, could not afford Multaq; c. CHADS2VASc => 5 (CHF, HTN, age x 1, nonobs CAD, female)--> Eliquis  . COPD (chronic obstructive pulmonary disease) ([place])   . GERD (gastroesophageal reflux disease)   . HFmrEF (heart failure with mid-range ejection fraction) ([place])    a. 12/2019 Echo: EF 40-45%.  . Hyperlipidemia   . Hypertension   . NICM (nonischemic cardiomyopathy) ([place])    a. 12/2019 Echo: EF 40-45%, glob HK, mildly reduced RV fxn, sev dil LA. *HR 130 (afib) during study.  . Nonobstructive CAD (coronary artery disease)    a. Lexiscan Myoview 10/2016: no evidence of ischemia, EF 53%; b. 02/2020 Cath: LM nl, LAD 20p, 34m, LCX 20ost, OM1/2/3 nl, LPDA nl, LPL1/2 nl, LPAV nl, RCA small, nl.  . Obesity   . Obstructive sleep apnea   . Pulmonary hypertension ([place])   . Systolic dysfunction    a. TTE 10/2016: EF 50%, mild LVH, moderately dilated LA, moderate MR/TR, mild pulmonary hypertension    Family History: Family History  Problem Relation Age of  Onset  . Dementia Mother   . Osteoporosis Mother   . Vascular Disease Mother   . COPD Father    Review of Systems  Constitutional: Negative.        For chills, fever, fatigue.  HENT: Negative.        For sinus pain, sinus pressure, sore throat, trouble swallowing.  Eyes: Negative.        For changes in vision or visual disturbances.  Respiratory: Negative.        For chest tightness, cough, shortness of breath, wheezing.  Cardiovascular: Negative.        For chest pain, ankle swelling, palpitations.  Gastrointestinal: Negative.        For abdominal pain, constipation, nausea, vomiting, diarrhea.  Endocrine: Negative.        For polydipsia, polyphagia, polyuria.  Genitourinary: Negative.        For dysuria, flank pain, hematuria, increased frequency, urgency.  Musculoskeletal: Negative.        For arthralgias, myalgias, back pain, neck pain, gait disturbances.   Skin: Negative.        For rash, wound.  Allergic/Immunologic: Negative.   Neurological: Negative.        For dizziness, headaches, tremors, weakness.  Hematological: Negative.   Psychiatric/Behavioral: Negative.        No confusion, depression, anxiety, sleep disturbances.   Vital Signs: BP 140/73   Pulse 65   Temp (!) 97.4 F (36.3 C)   Resp 16   Ht 5\' 4"  (1.626 m)   Wt 276 lb (125.2 kg)   LMP  (LMP Unknown)   SpO2 96%   BMI 47.38 kg/m   Physical Exam Vitals reviewed.  Constitutional:      Appearance: Normal appearance. She is obese.  HENT:     Head: Normocephalic.     Nose: Nose normal.     Mouth/Throat:     Mouth: Mucous membranes are moist.     Pharynx: Oropharynx is clear.  Cardiovascular:     Rate and Rhythm: Normal rate and regular rhythm.     Pulses: Normal pulses.     Heart sounds: Normal heart sounds.  Pulmonary:     Effort: Pulmonary effort is normal.     Breath sounds: Normal breath sounds.  Chest:     Breasts:        Right: Normal.        Left: Normal.  Abdominal:     General: Abdomen is flat. Bowel sounds are normal.     Palpations: Abdomen is soft.  Musculoskeletal:        General: Normal range of motion.     Cervical back: Normal range of motion.  Skin:    General: Skin is warm and dry.  Neurological:     General: No focal deficit present.     Mental Status: She is alert and oriented to person, place, and time. Mental status is at baseline.  Psychiatric:        Mood and Affect: Mood normal.        Behavior: Behavior normal.        Thought Content: Thought content normal.    LABS: Recent Results (from the past 2160 hour(s))  Comprehensive metabolic panel     Status: Abnormal   Collection Time: 05/16/20 10:30 AM  Result Value Ref Range   Sodium 137 135 - 145 mmol/L   Potassium 4.3 3.5 - 5.1 mmol/L   Chloride 101 98 - 111 mmol/L   CO2 27  22 - 32 mmol/L   Glucose, Bld 119 (H) 70 - 99 mg/dL    Comment: Glucose reference range applies  only to samples taken after fasting for at least 8 hours.   BUN 14 8 - 23 mg/dL   Creatinine, Ser 0.71 0.44 - 1.00 mg/dL   Calcium 9.0 8.9 - 10.3 mg/dL   Total Protein 6.7 6.5 - 8.1 g/dL   Albumin 3.9 3.5 - 5.0 g/dL   AST 20 15 - 41 U/L   ALT 19 0 - 44 U/L   Alkaline Phosphatase 56 38 - 126 U/L   Total Bilirubin 1.0 0.3 - 1.2 mg/dL   GFR calc non Af Amer >60 >60 mL/min   GFR calc Af Amer >60 >60 mL/min   Anion gap 9 5 - 15    Comment: Performed at [facility], [address] [place], [place] [address]  Lipid panel     Status: None   Collection Time: 05/16/20 10:30 AM  Result Value Ref Range   Cholesterol 136 0 - 200 mg/dL   Triglycerides 147 <150 mg/dL   HDL 66 >40 mg/dL   Total CHOL/HDL Ratio 2.1 RATIO   VLDL 29 0 - 40 mg/dL   LDL Cholesterol 41 0 - 99 mg/dL    Comment:        Total Cholesterol/HDL:CHD Risk Coronary Heart Disease Risk Table                     Men   Women  1/2 Average Risk   3.4   3.3  Average Risk       5.0   4.4  2 X Average Risk   9.6   7.1  3 X Average Risk  23.4   11.0        Use the calculated Patient Ratio above and the CHD Risk Table to determine the patient's CHD Risk.        ATP III CLASSIFICATION (LDL):  <100     mg/dL   Optimal  100-129  mg/dL   Near or Above                    Optimal  130-159  mg/dL   Borderline  160-189  mg/dL   High  >190     mg/dL   Very High Performed at [facility], [address] [place], [place] [address]   POCT INR     Status: Abnormal   Collection Time: 06/11/20 11:49 AM  Result Value Ref Range   INR 3.9 (A) 2.0 - 3.0  Pulmonary function test     Status: None   Collection Time: 06/13/20  2:00 PM  Result Value Ref Range   FEV1     FVC     FEV1/FVC     TLC     DLCO    POCT INR     Status: Abnormal   Collection Time: 06/13/20  2:33 PM  Result Value Ref Range   INR 3.6 (A) 2.0 - 3.0  UA/M w/rflx Culture, Routine     Status: None   Collection Time: 07/24/20 10:59 AM   Specimen:  Urine   Urine  Result Value Ref Range   Specific Gravity, UA 1.008 1.005 - 1.030   pH, UA 5.0 5.0 - 7.5   Color, UA Yellow Yellow   Appearance Ur Clear Clear   Leukocytes,UA Negative Negative   Protein,UA Negative Negative/Trace   Glucose, UA Negative Negative  Ketones, UA Negative Negative   RBC, UA Negative Negative   Bilirubin, UA Negative Negative   Urobilinogen, Ur 0.2 0.2 - 1.0 mg/dL   Nitrite, UA Negative Negative   Microscopic Examination Comment     Comment: Microscopic follows if indicated.   Microscopic Examination See below:     Comment: Microscopic was indicated and was performed.   Urinalysis Reflex Comment     Comment: This specimen will not reflex to a Urine Culture.  Microscopic Examination     Status: None   Collection Time: 07/24/20 10:59 AM   Urine  Result Value Ref Range   WBC, UA None seen 0 - 5 /hpf   RBC None seen 0 - 2 /hpf   Epithelial Cells (non renal) None seen 0 - 10 /hpf   Casts None seen None seen /lpf   Bacteria, UA None seen None seen/Few    Assessment/Plan: 1. Encounter for health maintenance examination with abnormal findings Reviewed lab work obtained within last 6 months, discussed with patient, normal findings. Well appearing 69 year old female, up to date on PHM.  2. Dysuria - UA/M w/rflx Culture, Routine - Microscopic Examination  3. Chronic atrial fibrillation (HCC) Continue with current medications, follow-up with cardiology as recommended.  4. Atherosclerosis of both carotid arteries Followed by vascular, as this time surgical intervention not warranted. Continue with diet, exercise as well as statin therapy. Will continue to monitor carotid US annually.  5. Morbid obesity ([place]) Discussed the importance of diet and exercise. Discussed the complications her obesity has on her HTN, atherosclerosis as well as HLD.  General Counseling: viana sleep understanding of the findings of todays visit and agrees with plan of  treatment. I have discussed any further diagnostic evaluation that may be needed or ordered today. We also reviewed her medications today. she has been encouraged to call the office with any questions or concerns that should arise related to todays visit.   Orders Placed This Encounter  Procedures  . Microscopic Examination  . UA/M w/rflx Culture, Routine    Total time spent:30 Minutes  This patient was seen by Theodoro Grist, AGNP-C in collaboration with Dr. [name] as part of a collaborative care agreement.   Time spent includes review of chart, medications, test results, and follow up plan with the patient.   Luiz Ochoa, AGNP-C  [name], MD Internal Medicine

## 2020-07-25 LAB — UA/M W/RFLX CULTURE, ROUTINE
Bilirubin, UA: NEGATIVE
Glucose, UA: NEGATIVE
Ketones, UA: NEGATIVE
Leukocytes,UA: NEGATIVE
Nitrite, UA: NEGATIVE
Protein,UA: NEGATIVE
RBC, UA: NEGATIVE
Specific Gravity, UA: 1.008 (ref 1.005–1.030)
Urobilinogen, Ur: 0.2 mg/dL (ref 0.2–1.0)
pH, UA: 5 (ref 5.0–7.5)

## 2020-07-25 LAB — MICROSCOPIC EXAMINATION
Bacteria, UA: NONE SEEN
Casts: NONE SEEN /lpf
Epithelial Cells (non renal): NONE SEEN /hpf (ref 0–10)
RBC, Urine: NONE SEEN /hpf (ref 0–2)
WBC, UA: NONE SEEN /hpf (ref 0–5)

## 2020-07-30 NOTE — Addendum Note (Signed)
Addended by: [name] on: 07/30/2020 04:55 PM   Modules accepted: Level of Service

## 2020-08-03 ENCOUNTER — Other Ambulatory Visit: Payer: Self-pay | Admitting: Internal Medicine

## 2020-08-03 DIAGNOSIS — J9611 Chronic respiratory failure with hypoxia: Secondary | ICD-10-CM

## 2020-08-07 ENCOUNTER — Telehealth: Payer: Self-pay | Admitting: Cardiovascular Disease

## 2020-08-07 NOTE — Telephone Encounter (Signed)
   [facility] Pre-operative Risk Assessment    HEARTCARE STAFF: - Please ensure there is not already an duplicate clearance open for this procedure. - Under Visit Info/Reason for Call, type in Other and utilize the format Clearance MM/DD/YY or Clearance TBD. Do not use dashes or single digits. - If request is for dental extraction, please clarify the # of teeth to be extracted.  Request for surgical clearance:  1. What type of surgery is being performed? Epidural sterios injection, lumbar (PROC)- bilateral L3-4 transfor  2. When is this surgery scheduled? TBD  3. What type of clearance is required (medical clearance vs. Pharmacy clearance to hold med vs. Both)? both  4. Are there any medications that need to be held prior to surgery and how long? Advise on eliquis  5. Practice name and name of physician performing surgery? [facility]  6. What is the office phone number? [phone]   7.   What is the office fax number? [phone]  8.   Anesthesia type (None, local, MAC, general) ? Lia Foyer Bumgarner 08/07/2020, 2:09 PM  _________________________________________________________________   (provider comments below)  \

## 2020-08-08 ENCOUNTER — Ambulatory Visit (INDEPENDENT_AMBULATORY_CARE_PROVIDER_SITE_OTHER): Payer: Medicare Other | Admitting: Internal Medicine

## 2020-08-08 ENCOUNTER — Other Ambulatory Visit: Payer: Self-pay

## 2020-08-08 DIAGNOSIS — J9611 Chronic respiratory failure with hypoxia: Secondary | ICD-10-CM

## 2020-08-08 DIAGNOSIS — R0602 Shortness of breath: Secondary | ICD-10-CM | POA: Diagnosis not present

## 2020-08-08 LAB — PULMONARY FUNCTION TEST

## 2020-08-08 NOTE — Telephone Encounter (Signed)
° °  Primary Cardiologist: Kathlyn Sacramento, MD  Chart reviewed as part of pre-operative protocol coverage. Given past medical history and time since last visit, based on ACC/AHA guidelines, Laura [name] would be at acceptable risk for the planned procedure without further cardiovascular testing.   Patient with diagnosis of atrial fibrillation on Eliqius for anticoagulation.    Procedure: epidural steroid injection, lumbar Date of procedure: TBD  CHADS2-VASc score of  5 (CHF, HTN, AGE, CAD, female)  CrCl 63.7 (with IBW) Platelet count 315  Per office protocol, patient can hold Eliquis for 3 days prior to procedure.    Patient will not need bridging with Lovenox (enoxaparin) around procedure.   I will route this recommendation to the requesting party via Epic fax function and remove from pre-op pool.  Please call with questions.  Jossie Ng. Karley Pho NP-C    08/08/2020, 10:42 AM [address] Office [phone] Fax [phone]

## 2020-08-08 NOTE — Telephone Encounter (Signed)
Patient with diagnosis of atrial fibrillation on Eliqius for anticoagulation.    Procedure: epidural steroid injection, lumbar Date of procedure: TBD  CHADS2-VASc score of  5 (CHF, HTN, AGE, CAD, female)  CrCl 63.7 (with IBW) Platelet count 315  Per office protocol, patient can hold Eliquis for 3 days prior to procedure.    Patient will not need bridging with Lovenox (enoxaparin) around procedure.

## 2020-08-10 ENCOUNTER — Ambulatory Visit: Payer: Medicare Other | Admitting: Nurse Practitioner

## 2020-08-12 NOTE — Procedures (Signed)
[place], [id number]  DATE OF SERVICE: August 08, 2020  Complete Pulmonary Function Testing Interpretation:  FINDINGS:  The forced vital capacity is mildly decreased.  The FEV1 is 1.36 L which is 62% of predicted and is moderately decreased.  FEV1 FVC ratio is mildly decreased.  Post bronchodilator there is significant improvement in the FEV1.  Total lung capacity is normal residual volume is increased residual volume total lung capacity ratio is increased FRC is normal.  DLCO is increased.  IMPRESSION:  This pulmonary function study is consistent with moderate obstructive lung disease.  There appears to be response to bronchodilators.  Clinical correlation is recommended  Allyne Gee, MD [facility] Pulmonary Critical Care Medicine Sleep Medicine

## 2020-08-13 NOTE — Telephone Encounter (Signed)
Patient calling  States [facility] has not received clearance  Please review and resend

## 2020-08-13 NOTE — Telephone Encounter (Signed)
Clearance note from 8/18 faxed to [phone].  Kerin Ransom PA-C 08/13/2020 10:53 AM

## 2020-08-15 ENCOUNTER — Ambulatory Visit: Payer: Medicare Other

## 2020-08-22 ENCOUNTER — Ambulatory Visit (INDEPENDENT_AMBULATORY_CARE_PROVIDER_SITE_OTHER): Payer: Medicare Other

## 2020-08-22 ENCOUNTER — Other Ambulatory Visit: Payer: Self-pay

## 2020-08-22 DIAGNOSIS — E538 Deficiency of other specified B group vitamins: Secondary | ICD-10-CM | POA: Diagnosis not present

## 2020-08-22 DIAGNOSIS — G4733 Obstructive sleep apnea (adult) (pediatric): Secondary | ICD-10-CM | POA: Diagnosis not present

## 2020-08-22 MED ORDER — CYANOCOBALAMIN 1000 MCG/ML IJ SOLN
1000.0000 ug | Freq: Once | INTRAMUSCULAR | Status: AC
  Administered 2020-08-22: 1000 ug via INTRAMUSCULAR

## 2020-08-22 NOTE — Progress Notes (Signed)
95 percentile pressure 14   95th percentile leak 26.2   apnea index 0.7 /hr  apnea-hypopnea index  1.5 /hr   total days used  >4 hr 30 days  total days used <4 hr 0 days  Total compliance 100 percent  She is doing great no problems or questions at this time

## 2020-09-13 ENCOUNTER — Other Ambulatory Visit: Payer: Self-pay

## 2020-09-13 DIAGNOSIS — J4550 Severe persistent asthma, uncomplicated: Secondary | ICD-10-CM

## 2020-09-13 MED ORDER — PREDNISONE 10 MG PO TABS: 10.0000 mg | ORAL_TABLET | Freq: Every day | ORAL | 0 refills | Status: DC

## 2020-09-21 ENCOUNTER — Telehealth (INDEPENDENT_AMBULATORY_CARE_PROVIDER_SITE_OTHER): Payer: Self-pay

## 2020-09-21 NOTE — Telephone Encounter (Signed)
Pt called the nurses line and left a VM wanting information about the number and name of the company who is supposed to deliver her lymph pumps. I called the pt  Back and made her aware that is is [employer] and  Gave her their number.

## 2020-09-27 ENCOUNTER — Encounter: Payer: Self-pay | Admitting: Family

## 2020-09-27 ENCOUNTER — Telehealth: Payer: Self-pay | Admitting: Gastroenterology

## 2020-09-27 ENCOUNTER — Telehealth: Payer: Self-pay

## 2020-09-27 ENCOUNTER — Other Ambulatory Visit: Payer: Self-pay

## 2020-09-27 ENCOUNTER — Ambulatory Visit (INDEPENDENT_AMBULATORY_CARE_PROVIDER_SITE_OTHER): Payer: Medicare Other | Admitting: Family

## 2020-09-27 ENCOUNTER — Other Ambulatory Visit
Admission: RE | Admit: 2020-09-27 | Discharge: 2020-09-27 | Disposition: A | Discharge status: Home / Self Care | Payer: Medicare Other | Source: Ambulatory Visit | Attending: Family | Admitting: Family

## 2020-09-27 VITALS — BP 112/50 | HR 67 | Ht 64.0 in | Wt 272.0 lb

## 2020-09-27 DIAGNOSIS — I482 Chronic atrial fibrillation, unspecified: Secondary | ICD-10-CM

## 2020-09-27 DIAGNOSIS — I5020 Unspecified systolic (congestive) heart failure: Secondary | ICD-10-CM

## 2020-09-27 DIAGNOSIS — Z5181 Encounter for therapeutic drug level monitoring: Secondary | ICD-10-CM | POA: Diagnosis not present

## 2020-09-27 DIAGNOSIS — I428 Other cardiomyopathies: Secondary | ICD-10-CM

## 2020-09-27 DIAGNOSIS — I2510 Atherosclerotic heart disease of native coronary artery without angina pectoris: Secondary | ICD-10-CM

## 2020-09-27 DIAGNOSIS — Z79899 Other long term (current) drug therapy: Secondary | ICD-10-CM | POA: Diagnosis present

## 2020-09-27 DIAGNOSIS — I4821 Permanent atrial fibrillation: Secondary | ICD-10-CM

## 2020-09-27 DIAGNOSIS — R42 Dizziness and giddiness: Secondary | ICD-10-CM

## 2020-09-27 DIAGNOSIS — Z7901 Long term (current) use of anticoagulants: Secondary | ICD-10-CM

## 2020-09-27 LAB — CBC
HCT: 41.7 % (ref 36.0–46.0)
Hemoglobin: 13.9 g/dL (ref 12.0–15.0)
MCH: 30.7 pg (ref 26.0–34.0)
MCHC: 33.3 g/dL (ref 30.0–36.0)
MCV: 92.1 fL (ref 80.0–100.0)
Platelets: 303 K/uL (ref 150–400)
RBC: 4.53 MIL/uL (ref 3.87–5.11)
RDW: 13.7 % (ref 11.5–15.5)
WBC: 7.2 K/uL (ref 4.0–10.5)
nRBC: 0 % (ref 0.0–0.2)

## 2020-09-27 LAB — BASIC METABOLIC PANEL WITH GFR
Anion gap: 10 (ref 5–15)
BUN: 14 mg/dL (ref 8–23)
CO2: 27 mmol/L (ref 22–32)
Calcium: 9.6 mg/dL (ref 8.9–10.3)
Chloride: 100 mmol/L (ref 98–111)
Creatinine, Ser: 0.95 mg/dL (ref 0.44–1.00)
GFR calc non Af Amer: >60 mL/min
Glucose, Bld: 123 mg/dL — ABNORMAL HIGH (ref 70–99)
Potassium: 3.8 mmol/L (ref 3.5–5.1)
Sodium: 137 mmol/L (ref 135–145)

## 2020-09-27 LAB — DIGOXIN LEVEL: Digoxin Level: 0.9 ng/mL — ABNORMAL LOW (ref 1.0–2.0)

## 2020-09-27 NOTE — Progress Notes (Signed)
Office Visit    Patient Name: Laura [name] Date of Encounter: 09/27/2020  Primary Care Provider:  [name], MD Primary Cardiologist:  Kathlyn Sacramento, MD Electrophysiologist:  None   Chief Complaint    Laura [name] is a 69 y.o. female with a hx of  chronic atrial fibrillation, obesity, OSA on CPAP, asthma, essential hypertension, HF with midrange EF, NICM, HLD, obesity, COPD, GERD, nonobstructive CAD presents today for follow up of CAD and heart failure  Past Medical History    Past Medical History:  Diagnosis Date  . Asthma   . Chronic atrial fibrillation ([place])    a. diagnosed in 09/2016; b. failed flecainide and propafenone due to LE swelling and SOB, could not afford Multaq; c. CHADS2VASc => 5 (CHF, HTN, age x 1, nonobs CAD, female)--> Eliquis  . COPD (chronic obstructive pulmonary disease) ([place])   . GERD (gastroesophageal reflux disease)   . HFmrEF (heart failure with mid-range ejection fraction) ([place])    a. 12/2019 Echo: EF 40-45%.  . Hyperlipidemia   . Hypertension   . NICM (nonischemic cardiomyopathy) ([place])    a. 12/2019 Echo: EF 40-45%, glob HK, mildly reduced RV fxn, sev dil LA. *HR 130 (afib) during study.  . Nonobstructive CAD (coronary artery disease)    a. Lexiscan Myoview 10/2016: no evidence of ischemia, EF 53%; b. 02/2020 Cath: LM nl, LAD 20p, 13m, LCX 20ost, OM1/2/3 nl, LPDA nl, LPL1/2 nl, LPAV nl, RCA small, nl.  . Obesity   . Obstructive sleep apnea   . Pulmonary hypertension ([place])   . Systolic dysfunction    a. TTE 10/2016: EF 50%, mild LVH, moderately dilated LA, moderate MR/TR, mild pulmonary hypertension   Past Surgical History:  Procedure Laterality Date  . BOWEL RESECTION  09/11/2019   Procedure: SMALL BOWEL RESECTION;  Surgeon: Herbert Pun, MD;  Location: [facility];  Service: General;;  . CARDIAC CATHETERIZATION    . cataract surgery    . COLONOSCOPY WITH PROPOFOL N/A 03/19/2020   Procedure: COLONOSCOPY WITH PROPOFOL;  Surgeon:  [name], MD;  Location: [facility];  Service: Gastroenterology;  Laterality: N/A;  . CORONARY ANGIOPLASTY    . INCISION AND DRAINAGE ABSCESS Right 06/29/2016   Procedure: INCISION AND DRAINAGE ABSCESS;  Surgeon: Florene Glen, MD;  Location: [facility];  Service: General;  Laterality: Right;  . INCISION AND DRAINAGE OF WOUND Left 06/29/2016   Procedure: IRRIGATION AND DEBRIDEMENT WOUND;  Surgeon: Florene Glen, MD;  Location: [facility];  Service: General;  Laterality: Left;  . LAPAROSCOPIC RIGHT COLECTOMY  09/11/2019   Procedure: RIGHT COLECTOMY;  Surgeon: Herbert Pun, MD;  Location: [facility];  Service: General;;  . LAPAROSCOPY N/A 09/11/2019   Procedure: LAPAROSCOPY DIAGNOSTIC;  Surgeon: Herbert Pun, MD;  Location: [facility];  Service: General;  Laterality: N/A;  . LAPAROTOMY N/A 09/13/2019   Procedure: REOPENING OF RECENT LAPAROTOMYANASTOMOSIS OF BOWEL;  Surgeon: Herbert Pun, MD;  Location: [facility];  Service: General;  Laterality: N/A;  . RIGHT/LEFT HEART CATH AND CORONARY ANGIOGRAPHY Bilateral 02/27/2020   Procedure: RIGHT/LEFT HEART CATH AND CORONARY ANGIOGRAPHY;  Surgeon: Wellington Hampshire, MD;  Location: [facility];  Service: Cardiovascular;  Laterality: Bilateral;  . VISCERAL ANGIOGRAPHY N/A 09/12/2019   Procedure: VISCERAL ANGIOGRAPHY;  Surgeon: Algernon Huxley, MD;  Location: [place] [facility];  Service: Cardiovascular;  Laterality: N/A;    Allergies  Allergies  Allergen Reactions  . Flecainide Shortness Of Breath  . Metoprolol Shortness Of Breath and  Swelling  . Propafenone Shortness Of Breath and Swelling  . Rivaroxaban Other (See Comments)    History of Present Illness    Laura [name] is a 69 y.o. female with a hx of chronic atrial fibrillation, obesity, OSA on CPAP, asthma, essential hypertension, HF with midrange EF, NICM, HLD, obesity, COPD, GERD, nonobstructive CAD. She was last seen 05/10/20.   Previous cardiac workup 2017  with pharmacologic nuclear stress test with no evidence of ischemia and EF 53%. Echo at that time with mildly reduced LVEF 50%, mild LVH, moderately dilated LA, moderate MR and TR with mild pulmonary hypertension.   Her atrial fibrillation is rate controlled as she was intolerant of multiple arrhythmics - previously failing flecainide and propafenone.  She has been previously intolerant of beta-blocker due to her lung disease. Also with trouble with Metoprolol and Rivaroxaban and as a result has been rate controlled on diltiazem and digoxin with Eliquis for anticoagulation.    Echo January 2021 with LVEF 40-45% and dilataed IVC. Subsequent CT chest dilated pulmonary artery suggestive of pulmonary hypertension. Underwent R/LHC 02/27/20 with mild nonobstructive disease, pulmonary hypertension related to left sided heart failure, wedge of 24 and PA of 43/24 (30). In follow up 03/20/20 Lasix was continued as renal function and electrolytes were stable. Lipid panel LDL 103 with Crstor 20mg  daily added.    Seen in clinic 05/10/20 doing well. Repeat lab work 05/16/20 with LDL 41. 06/01/20 she was transitioned to Warfarin due to cost, but her children then offered to pay for Eliquis and it was continued.  Tells me a couple weeks ago she had a bleed on her leg. She cut her leg shaving and had to work very hard to get the bleeding to stop because of her Eliquis. She is concerned about her blood counts.   Reports chest pain for the last week and a half. Occurs primarily when she lays down and she has been attributing it to GERD.  She notes feeling nauseous, lightheaded when she stands, and no energy for 3 days. Notes yesterday she ate two bowls of rice but did not tolerate well. She has not thrown up. She started back on her Dexilant a couple of days ago due to indigestion when laying down.  She did hold her Lasix for 2 days as she felt dehydrated.  She continued her spironolactone.  Does feel better today than she did two  days ago. Reports her bowels are regular.   Reports no chest pain, pressure, or tightness. No edema, orthopnea, PND. Reports no palpitations.    EKGs/Labs/Other Studies Reviewed:   The following studies were reviewed today:  Cath 02/27/20  Ost Cx lesion is 20% stenosed.  Prox LAD lesion is 20% stenosed.  Prox LAD to Mid LAD lesion is 30% stenosed.   1.  Mild nonobstructive coronary artery disease.  Left dominant coronary arteries. 2.  Left ventricular angiography was not performed.  EF was mildly reduced by echo. 3.  Right heart catheterization showed moderately elevated filling pressures with an RA of 15 mmHg, pulmonary capillary wedge pressure of 24 mmHg, PA pressure of 43/24 with a mean of 30 mmHg.  Normal cardiac output at 5.18 with a cardiac index of 2.34.  Pulmonary vascular resistance was only 1.16 [place] units.   Recommendations: No significant coronary artery disease to require revascularization. Pulmonary hypertension seems to be all related to left sided heart failure.  The patient is volume overloaded.  She used to be on 40 mg of furosemide  but that was discontinued when she was having diarrhea.  I am going to resume furosemide at the lower dose of 20 mg daily.  Echo 01/19/20  1. Left ventricular ejection fraction, by visual estimation, is 40 to  45%. The left ventricle has mild to moderately decreased function. There  is no left ventricular hypertrophy.   2. Global right ventricle has mildly reduced systolic function.The right  ventricular size is mildly enlarged. No increase in right ventricular wall  thickness.   3. Left ventricular diastolic parameters are indeterminate.   4. The left ventricle demonstrates global hypokinesis.   5. Left atrial size was severely dilated.   6. The inferior vena cava is dilated in size with <50% respiratory  variability, suggesting right atrial pressure of 15 mmHg.   7. Rhythm is atrial fibrillation, rate 130 bpm.   EKG: EKG ordered  today. EKG today demonstrates rate controlled atrial fibrillation 67 bpm with sloping and lateral T waves consistent with reduction these.  Reviewed by Dr. Fletcher Anon in clinic.  No acute ST/T wave changes.  Recent Labs: 01/02/2020: Magnesium 2.1; TSH 1.810 02/21/2020: Hemoglobin 13.5; Platelets 315 05/16/2020: ALT 19; BUN 14; Creatinine, Ser 0.71; Potassium 4.3; Sodium 137  Recent Lipid Panel    Component Value Date/Time   CHOL 136 05/16/2020 1030   CHOL 196 03/20/2020 0830   TRIG 147 05/16/2020 1030   HDL 66 05/16/2020 1030   HDL 68 03/20/2020 0830   CHOLHDL 2.1 05/16/2020 1030   VLDL 29 05/16/2020 1030   LDLCALC 41 05/16/2020 1030   LDLCALC 103 (H) 03/20/2020 0830    Home Medications   No outpatient medications have been marked as taking for the 09/27/20 encounter (Appointment) with Loel Dubonnet, NP.   Current Facility-Administered Medications for the 09/27/20 encounter (Appointment) with Loel Dubonnet, NP  Medication  . cyanocobalamin ((VITAMIN B-12)) injection 1,000 mcg    Review of Systems   Review of Systems  Constitutional: Positive for malaise/fatigue. Negative for chills and fever.  Cardiovascular: Negative for chest pain, dyspnea on exertion, leg swelling, near-syncope, orthopnea, palpitations and syncope.  Respiratory: Negative for cough, shortness of breath and wheezing.   Gastrointestinal: Positive for nausea. Negative for vomiting.  Neurological: Positive for light-headedness. Negative for weakness.   All other systems reviewed and are otherwise negative except as noted above.  Physical Exam    VS:  LMP  (LMP Unknown)  , BMI There is no height or weight on file to calculate BMI. GEN: Well nourished, well developed, in no acute distress. HEENT: normal. Neck: Supple, no JVD, carotid bruits, or masses. Cardiac: irregularly irregular, no murmurs, rubs, or gallops. No clubbing, cyanosis, edema.  Radials/DP/PT 2+ and equal bilaterally.  Respiratory:  Respirations  regular and unlabored.  Bilateral inspiratory wheezes in lower lobes. GI: Soft, nontender, nondistended, BS + x 4. MS: No deformity or atrophy. Skin: Warm and dry, no rash.  Right radial cath site clean, dry, intact with no erythema, ecchymosis, hematoma. Neuro:  Strength and sensation are intact. Psych: Normal affect.  Assessment & Plan    1. Coronary artery disease - Mild nonobstructive disease by cath 03/15/20.  Low-sodium, heart healthy diet encouraged.  Regular cardiovascular exercise encouraged.  Reports chest discomfort when lying down which is likely GERD in etiology.  EKG today with no acute ST/T wave changes.  No indication for repeat ischemic evaluation.  GDMT includes statin.  No aspirin secondary to chronic anticoagulation.  No beta-blocker secondary to intolerance.  2. Chronic atrial fibrillation/chronic  anticoagulation -rate controlled by EKG today.  Denies bleeding complications though does note she cut her leg shaving a few weeks ago without difficulty stopping it.  CBC, BMP today for monitoring.  Continue diltiazem 360 mg daily, digoxin 0.125 mg daily.  3. High risk medication use - Digoxin for atrial fibrillation. Digoxin level 03/02/20 0.3.  Reports 3-day history of nausea, lightheadedness.  Etiology viral GI illness versus digoxin toxicity.  Digoxin level today. If digoxin level unremarkable, recommend follow up with GI/PCP regarding GI symptoms   4. OSA - CPAP use encouraged.  5. HLD, LDL goal <70 -04/2020 LDL 41. Continue Crestor 20mg  daily.  6. HTN - BP well controlled. Continue current antihypertensive regimen.  BP low normal today in clinic.  Anticipate her GI illness, low PO intake, contributed to dehydration and likely caused orthostatic hypotension.  Encouraged to monitor blood pressure at home.  Encouraged to make position changes slowly.  7. Chronic systolic heart failure -LVEF 40-45% by echo 12/2019.  Right heart catheterization 02/2020 with pulmonary hypertension  related to left-sided heart failure. GDMT includes loop diuretic, MRA, ARB. No beta blocker secondary to intolerance.  Of note she is on diltiazem though this will be continued as tachycardia is thought to be contributory to her reduced EF.  Continue low-sodium, heartily diet.  Continue regular cardiovascular exercise.  Disposition: Follow up in 1 month with Dr. Fletcher Anon or APP   Loel Dubonnet, NP 09/27/2020, 7:32 AM

## 2020-09-27 NOTE — Patient Instructions (Addendum)
Medication Instructions:  No medication changes today.  *If you need a refill on your cardiac medications before your next appointment, please call your pharmacy*   Lab Work: Your physician recommends that you return for lab work today in the [place]: digoxin level, BMP, CBC  If you have labs (blood work) drawn today and your tests are completely normal, you will receive your results only by: Marland Kitchen MyChart Message (if you have MyChart) OR . A paper copy in the mail If you have any lab test that is abnormal or we need to change your treatment, we will call you to review the results.  Testing/Procedures: Your EKG today showed rate controlled atrial fibrillation.   Follow-Up: At [facility], you and your health needs are our priority.  As part of our continuing mission to provide you with exceptional heart care, we have created designated Provider Care Teams.  These Care Teams include your primary Cardiologist (physician) and Advanced Practice Providers (APPs -  Physician Assistants and Nurse Practitioners) who all work together to provide you with the care you need, when you need it.  We recommend signing up for the patient portal called "MyChart".  Sign up information is provided on this After Visit Summary.  MyChart is used to connect with patients for Virtual Visits (Telemedicine).  Patients are able to view lab/test results, encounter notes, upcoming appointments, etc.  Non-urgent messages can be sent to your provider as well.   To learn more about what you can do with MyChart, go to [email or web address].    Your next appointment:   1 month(s)  The format for your next appointment:   In Person  Provider:   You may see Kathlyn Sacramento, MD or one of the following Advanced Practice Providers on your designated Care Team:   Murray Hodgkins, NP  Christell Faith, PA-C  Marrianne Mood, PA-C  Cadence Kathlen Mody, [place]  Laurann [place], NP   Other Instructions   Orthostatic  Hypotension Blood pressure is a measurement of how strongly, or weakly, your blood is pressing against the walls of your arteries. Orthostatic hypotension is a sudden drop in blood pressure that happens when you quickly change positions, such as when you get up from sitting or lying down. Arteries are blood vessels that carry blood from your heart throughout your body. When blood pressure is too low, you may not get enough blood to your brain or to the rest of your organs. This can cause weakness, light-headedness, rapid heartbeat, and fainting. This can last for just a few seconds or for up to a few minutes. Orthostatic hypotension is usually not a serious problem. However, if it happens frequently or gets worse, it may be a sign of something more serious. What are the causes? This condition may be caused by:  Sudden changes in posture, such as standing up quickly after you have been sitting or lying down.  Blood loss.  Loss of body fluids (dehydration).  Heart problems.  Hormone (endocrine) problems.  Pregnancy.  Severe infection.  Lack of certain nutrients.  Severe allergic reactions (anaphylaxis).  Certain medicines, such as blood pressure medicine or medicines that make the body lose excess fluids (diuretics). Sometimes, this condition can be caused by not taking medicine as directed, such as taking too much of a certain medicine. What increases the risk? The following factors may make you more likely to develop this condition:  Age. Risk increases as you get older.  Conditions that affect the heart or the central nervous  system.  Taking certain medicines, such as blood pressure medicine or diuretics.  Being pregnant. What are the signs or symptoms? Symptoms of this condition may include:  Weakness.  Light-headedness.  Dizziness.  Blurred vision.  Fatigue.  Rapid heartbeat.  Fainting, in severe cases. How is this diagnosed? This condition is diagnosed based  on:  Your medical history.  Your symptoms.  Your blood pressure measurement. Your health care provider will check your blood pressure when you are: ? Lying down. ? Sitting. ? Standing. A blood pressure reading is recorded as two numbers, such as "120 over 80" (or 120/80). The first ("top") number is called the systolic pressure. It is a measure of the pressure in your arteries as your heart beats. The second ("bottom") number is called the diastolic pressure. It is a measure of the pressure in your arteries when your heart relaxes between beats. Blood pressure is measured in a unit called mm Hg. Healthy blood pressure for most adults is 120/80. If your blood pressure is below 90/60, you may be diagnosed with hypotension. Other information or tests that may be used to diagnose orthostatic hypotension include:  Your other vital signs, such as your heart rate and temperature.  Blood tests.  Tilt table test. For this test, you will be safely secured to a table that moves you from a lying position to an upright position. Your heart rhythm and blood pressure will be monitored during the test. How is this treated? This condition may be treated by:  Changing your diet. This may involve eating more salt (sodium) or drinking more water.  Taking medicines to raise your blood pressure.  Changing the dosage of certain medicines you are taking that might be lowering your blood pressure.  Wearing compression stockings. These stockings help to prevent blood clots and reduce swelling in your legs. In some cases, you may need to go to the hospital for:  Fluid replacement. This means you will receive fluids through an IV.  Blood replacement. This means you will receive donated blood through an IV (transfusion).  Treating an infection or heart problems, if this applies.  Monitoring. You may need to be monitored while medicines that you are taking wear off. Follow these instructions at home: Eating  and drinking   Drink enough fluid to keep your urine pale yellow.  Eat a healthy diet, and follow instructions from your health care provider about eating or drinking restrictions. A healthy diet includes: ? Fresh fruits and vegetables. ? Whole grains. ? Lean meats. ? Low-fat dairy products.  Eat extra salt only as directed. Do not add extra salt to your diet unless your health care provider told you to do that.  Eat frequent, small meals.  Avoid standing up suddenly after eating. Medicines  Take over-the-counter and prescription medicines only as told by your health care provider. ? Follow instructions from your health care provider about changing the dosage of your current medicines, if this applies. ? Do not stop or adjust any of your medicines on your own. General instructions   Wear compression stockings as told by your health care provider.  Get up slowly from lying down or sitting positions. This gives your blood pressure a chance to adjust.  Avoid hot showers and excessive heat as directed by your health care provider.  Return to your normal activities as told by your health care provider. Ask your health care provider what activities are safe for you.  Do not use any products that contain  nicotine or tobacco, such as cigarettes, e-cigarettes, and chewing tobacco. If you need help quitting, ask your health care provider.  Keep all follow-up visits as told by your health care provider. This is important. Contact a health care provider if you:  Vomit.  Have diarrhea.  Have a fever for more than 2-3 days.  Feel more thirsty than usual.  Feel weak and tired. Get help right away if you:  Have chest pain.  Have a fast or irregular heartbeat.  Develop numbness in any part of your body.  Cannot move your arms or your legs.  Have trouble speaking.  Become sweaty or feel light-headed.  Faint.  Feel short of breath.  Have trouble staying awake.  Feel  confused. Summary  Orthostatic hypotension is a sudden drop in blood pressure that happens when you quickly change positions.  Orthostatic hypotension is usually not a serious problem.  It is diagnosed by having your blood pressure taken lying down, sitting, and then standing.  It may be treated by changing your diet or adjusting your medicines. This information is not intended to replace advice given to you by your health care provider. Make sure you discuss any questions you have with your health care provider. Document Revised: 06/03/2018 Document Reviewed: 06/03/2018 Elsevier Patient Education  [place].

## 2020-09-27 NOTE — Telephone Encounter (Signed)
Patient called and stated that she has already left a message with the CMA and wants to know how soon she can be seen for Acid reflux. Clinical staff was informed.

## 2020-09-27 NOTE — Telephone Encounter (Signed)
Pt called stating she's having issues with acid reflux and requests an appointment with Dr. Vicente Males. Pt also wants to know what she could try taking in the meantime, while wait for her appointment?

## 2020-09-29 NOTE — Telephone Encounter (Signed)
Prilosec 40 mg a day 30 mins before breakfast

## 2020-10-01 ENCOUNTER — Ambulatory Visit (INDEPENDENT_AMBULATORY_CARE_PROVIDER_SITE_OTHER): Payer: Medicare Other | Admitting: Nurse Practitioner

## 2020-10-01 ENCOUNTER — Encounter: Payer: Self-pay | Admitting: Nurse Practitioner

## 2020-10-01 VITALS — BP 119/68 | Ht 64.0 in | Wt 271.0 lb

## 2020-10-01 DIAGNOSIS — R0602 Shortness of breath: Secondary | ICD-10-CM

## 2020-10-01 DIAGNOSIS — J441 Chronic obstructive pulmonary disease with (acute) exacerbation: Secondary | ICD-10-CM

## 2020-10-01 MED ORDER — PREDNISONE 10 MG (21) PO TBPK: ORAL_TABLET | ORAL | 0 refills | Status: DC

## 2020-10-01 MED ORDER — AZITHROMYCIN 250 MG PO TABS: ORAL_TABLET | ORAL | 0 refills | Status: DC

## 2020-10-01 NOTE — Progress Notes (Signed)
[facility] [place], [place] [address]  Internal MEDICINE  Telephone Visit  Patient Name: Laura [name]  [id number]  [id number]  Date of Service: 10/17/2020  I connected with the patient at 5:03pm by telephone and verified the patients identity using two identifiers.   I discussed the limitations, risks, security and privacy concerns of performing an evaluation and management service by telephone and the availability of in person appointments. I also discussed with the patient that there may be a patient responsible charge related to the service.  The patient expressed understanding and agrees to proceed.    Chief Complaint  Patient presents with  . Acute Visit    cough, reflux, weakness  . Telephone Assessment  . Telephone Screen  . Cough  . Vomiting  . Nausea  . Neck Pain  . Heartburn  . Shortness of Breath    The patient has been contacted via telephone for follow up visit due to concerns for spread of novel coronavirus. The patient presents for sick visit. When asked what's wrong, she states, "everything."  She states that she has been coughing, gagging when she coughs, has severe nausea. She states that she is vomiting up everything she tries to eat. She states that she is weak, dizzy, and has pain on the left side of the neck up to her head. Denies headache or fever. She states that she saw her heart doctor last week. Went in to be seen. ECG was good and they did blood work done, and states that she was sent home and told to rest. She has tried to contact her GI doctor, and she is unable to be seen until December. Has been taking previously prescribed zofran whiteout relief of nausea. She does feel an abundance of acid in her stomach. Still has some of her dexilant which has not taken in some time. Would like to take this to see if it helps. She states that when shortness of breath has been severe in the past, she has taken z-pack and prednisone taper.  These mediations together have helped a lot.       Current Medication: Outpatient Encounter Medications as of 10/01/2020  Medication Sig  . albuterol (VENTOLIN HFA) 108 (90 Base) MCG/ACT inhaler Inhale 2 puffs into the lungs every 4 (four) hours as needed for wheezing or shortness of breath.  . ALPRAZolam (XANAX) 0.25 MG tablet Take 1 tablet (0.25 mg total) by mouth 2 (two) times daily.  Marland Kitchen apixaban (ELIQUIS) 5 MG TABS tablet Take 1 tablet (5 mg total) by mouth 2 (two) times daily.  . benzonatate (TESSALON) 100 MG capsule Take 1 capsule (100 mg total) by mouth 2 (two) times daily as needed for cough.  . budesonide (PULMICORT) 0.25 MG/2ML nebulizer solution Take 2 mLs (0.25 mg total) by nebulization 2 (two) times daily. (Patient not taking: Reported on 10/11/2020)  . cyanocobalamin (,VITAMIN B-12,) 1000 MCG/ML injection Inject 1,000 mcg into the muscle every 30 (thirty) days.  . digoxin (LANOXIN) 0.125 MG tablet Take 1 tablet (0.125 mg total) by mouth daily.  Marland Kitchen diltiazem (CARDIZEM CD) 360 MG 24 hr capsule Take 1 capsule (360 mg total) by mouth daily.  Marland Kitchen EPINEPHrine 0.3 mg/0.3 mL IJ SOAJ injection Inject 0.3 mg into the muscle as needed for anaphylaxis.   Marland Kitchen ipratropium-albuterol (DUONEB) 0.5-2.5 (3) MG/3ML SOLN Take 3 mLs by nebulization every 4 (four) hours as needed.  Marland Kitchen losartan (COZAAR) 25 MG tablet Take 1 tablet (25 mg total) by  mouth daily.  . montelukast (SINGULAIR) 10 MG tablet Take 1 tablet (10 mg total) by mouth daily.  . Multiple Vitamin (MULTIVITAMIN WITH MINERALS) TABS tablet Take 1 tablet by mouth daily. (Patient not taking: Reported on 10/11/2020)  . ondansetron (ZOFRAN) 4 MG tablet Take 1 tablet (4 mg total) by mouth every 8 (eight) hours as needed for nausea or vomiting.  . predniSONE (DELTASONE) 10 MG tablet Take 1 tablet (10 mg total) by mouth daily with breakfast.  . Probiotic Product (PROBIOTIC-10) CAPS Take 1 capsule by mouth daily.   . theophylline (THEODUR) 300 MG 12 hr  tablet Take 1 tablet (300 mg total) by mouth 2 (two) times daily.  . traMADol (ULTRAM) 50 MG tablet Take 50 mg by mouth every 6 (six) hours.  . [DISCONTINUED] furosemide (LASIX) 20 MG tablet Take 1 tablet (20 mg total) by mouth daily. (Patient not taking: Reported on 10/09/2020)  . [DISCONTINUED] spironolactone (ALDACTONE) 25 MG tablet Take 1 tablet (25 mg total) by mouth 2 (two) times daily. (Patient not taking: Reported on 10/09/2020)  . cholestyramine (QUESTRAN) 4 g packet Take 1 packet (4 g total) by mouth 2 (two) times daily.  . rosuvastatin (CRESTOR) 20 MG tablet Take 1 tablet (20 mg total) by mouth daily.  . [DISCONTINUED] azithromycin (ZITHROMAX) 250 MG tablet z-pack - take as directed for 5 days (Patient not taking: Reported on 10/11/2020)  . [DISCONTINUED] predniSONE (STERAPRED UNI-PAK 21 TAB) 10 MG (21) TBPK tablet 6 day taper - take by mouth as directed for 6 days (Patient not taking: Reported on 10/09/2020)   Facility-Administered Encounter Medications as of 10/01/2020  Medication Note  . cyanocobalamin ((VITAMIN B-12)) injection 1,000 mcg 02/27/2020: Due 02/28/2020    Surgical History: Past Surgical History:  Procedure Laterality Date  . BOWEL RESECTION  09/11/2019   Procedure: SMALL BOWEL RESECTION;  Surgeon: Herbert Pun, MD;  Location: [facility] ORS;  Service: General;;  . CARDIAC CATHETERIZATION    . cataract surgery    . COLONOSCOPY WITH PROPOFOL N/A 03/19/2020   Procedure: COLONOSCOPY WITH PROPOFOL;  Surgeon: [name], MD;  Location: [facility]COPY;  Service: Gastroenterology;  Laterality: N/A;  . CORONARY ANGIOPLASTY    . INCISION AND DRAINAGE ABSCESS Right 06/29/2016   Procedure: INCISION AND DRAINAGE ABSCESS;  Surgeon: Florene Glen, MD;  Location: [facility] ORS;  Service: General;  Laterality: Right;  . INCISION AND DRAINAGE OF WOUND Left 06/29/2016   Procedure: IRRIGATION AND DEBRIDEMENT WOUND;  Surgeon: Florene Glen, MD;  Location: [facility] ORS;  Service: General;   Laterality: Left;  . LAPAROSCOPIC RIGHT COLECTOMY  09/11/2019   Procedure: RIGHT COLECTOMY;  Surgeon: Herbert Pun, MD;  Location: [facility] ORS;  Service: General;;  . LAPAROSCOPY N/A 09/11/2019   Procedure: LAPAROSCOPY DIAGNOSTIC;  Surgeon: Herbert Pun, MD;  Location: [facility] ORS;  Service: General;  Laterality: N/A;  . LAPAROTOMY N/A 09/13/2019   Procedure: REOPENING OF RECENT LAPAROTOMYANASTOMOSIS OF BOWEL;  Surgeon: Herbert Pun, MD;  Location: [facility] ORS;  Service: General;  Laterality: N/A;  . RIGHT/LEFT HEART CATH AND CORONARY ANGIOGRAPHY Bilateral 02/27/2020   Procedure: RIGHT/LEFT HEART CATH AND CORONARY ANGIOGRAPHY;  Surgeon: Wellington Hampshire, MD;  Location: [place] [facility];  Service: Cardiovascular;  Laterality: Bilateral;  . VISCERAL ANGIOGRAPHY N/A 09/12/2019   Procedure: VISCERAL ANGIOGRAPHY;  Surgeon: Algernon Huxley, MD;  Location: [place] [facility];  Service: Cardiovascular;  Laterality: N/A;    Medical History: Past Medical History:  Diagnosis Date  . Asthma   . Chronic  atrial fibrillation ([place])    a. diagnosed in 09/2016; b. failed flecainide and propafenone due to LE swelling and SOB, could not afford Multaq; c. CHADS2VASc => 5 (CHF, HTN, age x 1, nonobs CAD, female)--> Eliquis  . COPD (chronic obstructive pulmonary disease) ([place])   . GERD (gastroesophageal reflux disease)   . HFmrEF (heart failure with mid-range ejection fraction) ([place])    a. 12/2019 Echo: EF 40-45%.  . Hyperlipidemia   . Hypertension   . NICM (nonischemic cardiomyopathy) ([place])    a. 12/2019 Echo: EF 40-45%, glob HK, mildly reduced RV fxn, sev dil LA. *HR 130 (afib) during study.  . Nonobstructive CAD (coronary artery disease)    a. Lexiscan Myoview 10/2016: no evidence of ischemia, EF 53%; b. 02/2020 Cath: LM nl, LAD 20p, 32m, LCX 20ost, OM1/2/3 nl, LPDA nl, LPL1/2 nl, LPAV nl, RCA small, nl.  . Obesity   . Obstructive sleep apnea   . Pulmonary hypertension ([place])   . Systolic  dysfunction    a. TTE 10/2016: EF 50%, mild LVH, moderately dilated LA, moderate MR/TR, mild pulmonary hypertension    Family History: Family History  Problem Relation Age of Onset  . Dementia Mother   . Osteoporosis Mother   . Vascular Disease Mother   . COPD Father     Social History   Socioeconomic History  . Marital status: Married    Spouse name: [name]   . Number of children: 2  . Years of education: Not on file  . Highest education level: Not on file  Occupational History  . Not on file  Tobacco Use  . Smoking status: Never Smoker  . Smokeless tobacco: Never Used  Vaping Use  . Vaping Use: Never used  Substance and Sexual Activity  . Alcohol use: No  . Drug use: No  . Sexual activity: Not on file  Other Topics Concern  . Not on file  Social History Narrative   3 grandchildren: 7 great grandkids.    Social Determinants of Health   Financial Resource Strain:   . Difficulty of Paying Living Expenses: Not on file  Food Insecurity:   . Worried About Charity fundraiser in the Last Year: Not on file  . Ran Out of Food in the Last Year: Not on file  Transportation Needs:   . Lack of Transportation (Medical): Not on file  . Lack of Transportation (Non-Medical): Not on file  Physical Activity:   . Days of Exercise per Week: Not on file  . Minutes of Exercise per Session: Not on file  Stress:   . Feeling of Stress : Not on file  Social Connections:   . Frequency of Communication with Friends and Family: Not on file  . Frequency of Social Gatherings with Friends and Family: Not on file  . Attends Religious Services: Not on file  . Active Member of Clubs or Organizations: Not on file  . Attends Archivist Meetings: Not on file  . Marital Status: Not on file  Intimate Partner Violence:   . Fear of Current or Ex-Partner: Not on file  . Emotionally Abused: Not on file  . Physically Abused: Not on file  . Sexually Abused: Not on file      Review of  Systems  Constitutional: Positive for chills, fatigue and fever.  HENT: Positive for congestion, postnasal drip and rhinorrhea. Negative for sore throat.   Respiratory: Positive for cough, chest tightness, shortness of breath and wheezing.   Cardiovascular: Negative  for chest pain and palpitations.  Gastrointestinal: Positive for nausea.  Musculoskeletal: Positive for arthralgias.  Allergic/Immunologic: Positive for environmental allergies.  Neurological: Positive for headaches.  Hematological: Negative for adenopathy.  Psychiatric/Behavioral: The patient is nervous/anxious.     Today's Vitals   10/01/20 1509  BP: 119/68  Weight: 271 lb (122.9 kg)  Height: 5\' 4"  (1.626 m)   Body mass index is 46.52 kg/m.  Observation/Objective:   The patient is alert and oriented. She is pleasant and answers all questions appropriately. Breathing is non-labored. She is in no acute distress at this time.  The patient is nasally congested. She is hoarse. She hsa very congested sounding cough. She is unable to get through full sentence without being short of breath.    Assessment/Plan: 1. COPD with acute exacerbation ([place]) Start z-pack. Take as directed for 5 days. Use inhalers and breathing treatments as needed and as prescribed for acute shortness of breath. Add prednisone taper. Take as directed for 6 days. Will get chest x-ray for further evaluation.   2. Shortness of breath Will get chest x-ray for further evaluation.  - DG Chest 2 View; Future  General Counseling: cabrini ruggieri understanding of the findings of today's phone visit and agrees with plan of treatment. I have discussed any further diagnostic evaluation that may be needed or ordered today. We also reviewed her medications today. she has been encouraged to call the office with any questions or concerns that should arise related to todays visit.  This patient was seen by Leretha Pol FNP Collaboration with Dr [name] as a  part of collaborative care agreement  Orders Placed This Encounter  Procedures  . DG Chest 2 View    Meds ordered this encounter  Medications  . DISCONTD: predniSONE (STERAPRED UNI-PAK 21 TAB) 10 MG (21) TBPK tablet    Sig: 6 day taper - take by mouth as directed for 6 days    Dispense:  21 tablet    Refill:  0    Order Specific Question:   Supervising Provider    Answer:   [name] [place]  . DISCONTD: azithromycin (ZITHROMAX) 250 MG tablet    Sig: z-pack - take as directed for 5 days    Dispense:  6 tablet    Refill:  0    Order Specific Question:   Supervising Provider    Answer:   [name] [[id number]]    Time spent: 31 Minutes    Dr [name] Internal medicine

## 2020-10-02 MED ORDER — OMEPRAZOLE 40 MG PO CPDR
40.0000 mg | DELAYED_RELEASE_CAPSULE | Freq: Every day | ORAL | 0 refills | Status: DC
Start: 2020-10-02 — End: 2020-11-23

## 2020-10-03 ENCOUNTER — Ambulatory Visit
Admission: RE | Admit: 2020-10-03 | Discharge: 2020-10-03 | Disposition: A | Discharge status: Home / Self Care | Payer: Medicare Other | Attending: Nurse Practitioner | Admitting: Nurse Practitioner

## 2020-10-03 ENCOUNTER — Other Ambulatory Visit: Payer: Self-pay

## 2020-10-03 ENCOUNTER — Ambulatory Visit
Admission: RE | Admit: 2020-10-03 | Discharge: 2020-10-03 | Disposition: A | Discharge status: Home / Self Care | Payer: Medicare Other | Source: Ambulatory Visit | Attending: Nurse Practitioner | Admitting: Nurse Practitioner

## 2020-10-03 DIAGNOSIS — R0602 Shortness of breath: Secondary | ICD-10-CM

## 2020-10-04 ENCOUNTER — Ambulatory Visit: Payer: Medicare Other | Admitting: Internal Medicine

## 2020-10-05 NOTE — Progress Notes (Signed)
Please let the patient know that her chest x-ray was normal. Thanks.

## 2020-10-09 ENCOUNTER — Ambulatory Visit (INDEPENDENT_AMBULATORY_CARE_PROVIDER_SITE_OTHER): Payer: Medicare Other | Admitting: Internal Medicine

## 2020-10-09 ENCOUNTER — Other Ambulatory Visit: Payer: Self-pay

## 2020-10-09 ENCOUNTER — Encounter: Payer: Self-pay | Admitting: Internal Medicine

## 2020-10-09 DIAGNOSIS — J4520 Mild intermittent asthma, uncomplicated: Secondary | ICD-10-CM | POA: Diagnosis not present

## 2020-10-09 DIAGNOSIS — Z9989 Dependence on other enabling machines and devices: Secondary | ICD-10-CM

## 2020-10-09 DIAGNOSIS — Z7189 Other specified counseling: Secondary | ICD-10-CM

## 2020-10-09 DIAGNOSIS — G4733 Obstructive sleep apnea (adult) (pediatric): Secondary | ICD-10-CM | POA: Diagnosis not present

## 2020-10-09 DIAGNOSIS — I6523 Occlusion and stenosis of bilateral carotid arteries: Secondary | ICD-10-CM

## 2020-10-09 NOTE — Progress Notes (Signed)
[facility] [place],  [address]  Pulmonary Sleep Medicine   Office Visit Note  Patient Name: Laura [name] DOB: [birth year] MRN [id number]  Date of Service: 10/12/2020  Complaints/HPI: Patient is here for routine pulmonary follow-up She is followed for OSA on CPAP as well as asthma CPAP-100% compliance and AHI is well controlled Reports no issues or complications with her CPAP, denies dryness, congestion or headaches Asthma-She feels her breathing is well controlled at this time Using her nebulizer daily, rarely feels the need to use her rescue albuterol inhaler Followed by cardiology for CHF and A. Fib Reviewed her recent PFT with her--moderate obstructive lung disease  ROS  General: (-) fever, (-) chills, (-) night sweats, (-) weakness Skin: (-) rashes, (-) itching,. Eyes: (-) visual changes, (-) redness, (-) itching. Nose and Sinuses: (-) nasal stuffiness or itchiness, (-) postnasal drip, (-) nosebleeds, (-) sinus trouble. Mouth and Throat: (-) sore throat, (-) hoarseness. Neck: (-) swollen glands, (-) enlarged thyroid, (-) neck pain. Respiratory: - cough, (-) bloody sputum, - shortness of breath, - wheezing. Cardiovascular: - ankle swelling, (-) chest pain. Lymphatic: (-) lymph node enlargement. Neurologic: (-) numbness, (-) tingling. Psychiatric: (-) anxiety, (-) depression   Current Medication: Outpatient Encounter Medications as of 10/09/2020  Medication Sig  . albuterol (VENTOLIN HFA) 108 (90 Base) MCG/ACT inhaler Inhale 2 puffs into the lungs every 4 (four) hours as needed for wheezing or shortness of breath.  . ALPRAZolam (XANAX) 0.25 MG tablet Take 1 tablet (0.25 mg total) by mouth 2 (two) times daily.  Marland Kitchen apixaban (ELIQUIS) 5 MG TABS tablet Take 1 tablet (5 mg total) by mouth 2 (two) times daily.  . benzonatate (TESSALON) 100 MG capsule Take 1 capsule (100 mg total) by mouth 2 (two) times daily as needed for cough.  .  budesonide (PULMICORT) 0.25 MG/2ML nebulizer solution Take 2 mLs (0.25 mg total) by nebulization 2 (two) times daily. (Patient not taking: Reported on 10/11/2020)  . cyanocobalamin (,VITAMIN B-12,) 1000 MCG/ML injection Inject 1,000 mcg into the muscle every 30 (thirty) days.  . digoxin (LANOXIN) 0.125 MG tablet Take 1 tablet (0.125 mg total) by mouth daily.  Marland Kitchen diltiazem (CARDIZEM CD) 360 MG 24 hr capsule Take 1 capsule (360 mg total) by mouth daily.  Marland Kitchen EPINEPHrine 0.3 mg/0.3 mL IJ SOAJ injection Inject 0.3 mg into the muscle as needed for anaphylaxis.   Marland Kitchen ipratropium-albuterol (DUONEB) 0.5-2.5 (3) MG/3ML SOLN Take 3 mLs by nebulization every 4 (four) hours as needed.  Marland Kitchen losartan (COZAAR) 25 MG tablet Take 1 tablet (25 mg total) by mouth daily.  . montelukast (SINGULAIR) 10 MG tablet Take 1 tablet (10 mg total) by mouth daily.  . Multiple Vitamin (MULTIVITAMIN WITH MINERALS) TABS tablet Take 1 tablet by mouth daily. (Patient not taking: Reported on 10/11/2020)  . omeprazole (PRILOSEC) 40 MG capsule Take 1 capsule (40 mg total) by mouth daily.  . ondansetron (ZOFRAN) 4 MG tablet Take 1 tablet (4 mg total) by mouth every 8 (eight) hours as needed for nausea or vomiting.  . predniSONE (DELTASONE) 10 MG tablet Take 1 tablet (10 mg total) by mouth daily with breakfast.  . Probiotic Product (PROBIOTIC-10) CAPS Take 1 capsule by mouth daily.   . theophylline (THEODUR) 300 MG 12 hr tablet Take 1 tablet (300 mg total) by mouth 2 (two) times daily.  . traMADol (ULTRAM) 50 MG tablet Take 50 mg by mouth every 6 (six) hours.  . [DISCONTINUED] azithromycin (ZITHROMAX) 250 MG  tablet z-pack - take as directed for 5 days (Patient not taking: Reported on 10/11/2020)  . cholestyramine (QUESTRAN) 4 g packet Take 1 packet (4 g total) by mouth 2 (two) times daily.  . rosuvastatin (CRESTOR) 20 MG tablet Take 1 tablet (20 mg total) by mouth daily.  . [DISCONTINUED] furosemide (LASIX) 20 MG tablet Take 1 tablet (20 mg  total) by mouth daily. (Patient not taking: Reported on 10/09/2020)  . [DISCONTINUED] predniSONE (STERAPRED UNI-PAK 21 TAB) 10 MG (21) TBPK tablet 6 day taper - take by mouth as directed for 6 days (Patient not taking: Reported on 10/09/2020)  . [DISCONTINUED] spironolactone (ALDACTONE) 25 MG tablet Take 1 tablet (25 mg total) by mouth 2 (two) times daily. (Patient not taking: Reported on 10/09/2020)   Facility-Administered Encounter Medications as of 10/09/2020  Medication Note  . cyanocobalamin ((VITAMIN B-12)) injection 1,000 mcg 02/27/2020: Due 02/28/2020    Surgical History: Past Surgical History:  Procedure Laterality Date  . BOWEL RESECTION  09/11/2019   Procedure: SMALL BOWEL RESECTION;  Surgeon: Herbert Pun, MD;  Location: [facility] ORS;  Service: General;;  . CARDIAC CATHETERIZATION    . cataract surgery    . COLONOSCOPY WITH PROPOFOL N/A 03/19/2020   Procedure: COLONOSCOPY WITH PROPOFOL;  Surgeon: [name], MD;  Location: [facility] ENDOSCOPY;  Service: Gastroenterology;  Laterality: N/A;  . CORONARY ANGIOPLASTY    . INCISION AND DRAINAGE ABSCESS Right 06/29/2016   Procedure: INCISION AND DRAINAGE ABSCESS;  Surgeon: Florene Glen, MD;  Location: [facility] ORS;  Service: General;  Laterality: Right;  . INCISION AND DRAINAGE OF WOUND Left 06/29/2016   Procedure: IRRIGATION AND DEBRIDEMENT WOUND;  Surgeon: Florene Glen, MD;  Location: [facility] ORS;  Service: General;  Laterality: Left;  . LAPAROSCOPIC RIGHT COLECTOMY  09/11/2019   Procedure: RIGHT COLECTOMY;  Surgeon: Herbert Pun, MD;  Location: [facility] ORS;  Service: General;;  . LAPAROSCOPY N/A 09/11/2019   Procedure: LAPAROSCOPY DIAGNOSTIC;  Surgeon: Herbert Pun, MD;  Location: [facility] ORS;  Service: General;  Laterality: N/A;  . LAPAROTOMY N/A 09/13/2019   Procedure: REOPENING OF RECENT LAPAROTOMYANASTOMOSIS OF BOWEL;  Surgeon: Herbert Pun, MD;  Location: [facility] ORS;  Service: General;  Laterality: N/A;  . RIGHT/LEFT  HEART CATH AND CORONARY ANGIOGRAPHY Bilateral 02/27/2020   Procedure: RIGHT/LEFT HEART CATH AND CORONARY ANGIOGRAPHY;  Surgeon: Wellington Hampshire, MD;  Location: [place] CV LAB;  Service: Cardiovascular;  Laterality: Bilateral;  . VISCERAL ANGIOGRAPHY N/A 09/12/2019   Procedure: VISCERAL ANGIOGRAPHY;  Surgeon: Algernon Huxley, MD;  Location: [place] CV LAB;  Service: Cardiovascular;  Laterality: N/A;    Medical History: Past Medical History:  Diagnosis Date  . Asthma   . Chronic atrial fibrillation ([place])    a. diagnosed in 09/2016; b. failed flecainide and propafenone due to LE swelling and SOB, could not afford Multaq; c. CHADS2VASc => 5 (CHF, HTN, age x 1, nonobs CAD, female)--> Eliquis  . COPD (chronic obstructive pulmonary disease) ([place])   . GERD (gastroesophageal reflux disease)   . HFmrEF (heart failure with mid-range ejection fraction) ([place])    a. 12/2019 Echo: EF 40-45%.  . Hyperlipidemia   . Hypertension   . NICM (nonischemic cardiomyopathy) ([place])    a. 12/2019 Echo: EF 40-45%, glob HK, mildly reduced RV fxn, sev dil LA. *HR 130 (afib) during study.  . Nonobstructive CAD (coronary artery disease)    a. Lexiscan Myoview 10/2016: no evidence of ischemia, EF 53%; b. 02/2020 Cath: LM nl, LAD 20p, 69m, LCX 20ost, OM1/2/3  nl, LPDA nl, LPL1/2 nl, LPAV nl, RCA small, nl.  . Obesity   . Obstructive sleep apnea   . Pulmonary hypertension ([place])   . Systolic dysfunction    a. TTE 10/2016: EF 50%, mild LVH, moderately dilated LA, moderate MR/TR, mild pulmonary hypertension    Family History: Family History  Problem Relation Age of Onset  . Dementia Mother   . Osteoporosis Mother   . Vascular Disease Mother   . COPD Father     Social History: Social History   Socioeconomic History  . Marital status: Married    Spouse name: [name]   . Number of children: 2  . Years of education: Not on file  . Highest education level: Not on file  Occupational History  . Not on file   Tobacco Use  . Smoking status: Never Smoker  . Smokeless tobacco: Never Used  Vaping Use  . Vaping Use: Never used  Substance and Sexual Activity  . Alcohol use: No  . Drug use: No  . Sexual activity: Not on file  Other Topics Concern  . Not on file  Social History Narrative   3 grandchildren: 7 great grandkids.    Social Determinants of Health   Financial Resource Strain:   . Difficulty of Paying Living Expenses: Not on file  Food Insecurity:   . Worried About Charity fundraiser in the Last Year: Not on file  . Ran Out of Food in the Last Year: Not on file  Transportation Needs:   . Lack of Transportation (Medical): Not on file  . Lack of Transportation (Non-Medical): Not on file  Physical Activity:   . Days of Exercise per Week: Not on file  . Minutes of Exercise per Session: Not on file  Stress:   . Feeling of Stress : Not on file  Social Connections:   . Frequency of Communication with Friends and Family: Not on file  . Frequency of Social Gatherings with Friends and Family: Not on file  . Attends Religious Services: Not on file  . Active Member of Clubs or Organizations: Not on file  . Attends Archivist Meetings: Not on file  . Marital Status: Not on file  Intimate Partner Violence:   . Fear of Current or Ex-Partner: Not on file  . Emotionally Abused: Not on file  . Physically Abused: Not on file  . Sexually Abused: Not on file    Vital Signs: Blood pressure 128/74, pulse 64, resp. rate 16, height 5\' 4"  (1.626 m), weight 270 lb 6.4 oz (122.7 kg), SpO2 98 %.  Examination: General Appearance: The patient is well-developed, well-nourished, and in no distress. Skin: Gross inspection of skin unremarkable. Head: normocephalic, no gross deformities. Eyes: no gross deformities noted. ENT: ears appear grossly normal no exudates. Neck: Supple. No thyromegaly. No LAD. Respiratory: Clear throughout, no rhonchi or wheezing noted. Cardiovascular: Normal S1  and S2 without murmur or rub. Extremities: No cyanosis. pulses are equal. Neurologic: Alert and oriented. No involuntary movements.  LABS: Recent Results (from the past 2160 hour(s))  UA/M w/rflx Culture, Routine     Status: None   Collection Time: 07/24/20 10:59 AM   Specimen: Urine   Urine  Result Value Ref Range   Specific Gravity, UA 1.008 1.005 - 1.030   pH, UA 5.0 5.0 - 7.5   Color, UA Yellow Yellow   Appearance Ur Clear Clear   Leukocytes,UA Negative Negative   Protein,UA Negative Negative/Trace   Glucose, UA Negative  Negative   Ketones, UA Negative Negative   RBC, UA Negative Negative   Bilirubin, UA Negative Negative   Urobilinogen, Ur 0.2 0.2 - 1.0 mg/dL   Nitrite, UA Negative Negative   Microscopic Examination Comment     Comment: Microscopic follows if indicated.   Microscopic Examination See below:     Comment: Microscopic was indicated and was performed.   Urinalysis Reflex Comment     Comment: This specimen will not reflex to a Urine Culture.  Microscopic Examination     Status: None   Collection Time: 07/24/20 10:59 AM   Urine  Result Value Ref Range   WBC, UA None seen 0 - 5 /hpf   RBC None seen 0 - 2 /hpf   Epithelial Cells (non renal) None seen 0 - 10 /hpf   Casts None seen None seen /lpf   Bacteria, UA None seen None seen/Few  Pulmonary Function Test     Status: None   Collection Time: 08/08/20  9:30 AM  Result Value Ref Range   FEV1     FVC     FEV1/FVC     TLC     DLCO    CBC     Status: None   Collection Time: 09/27/20  9:52 AM  Result Value Ref Range   WBC 7.2 4.0 - 10.5 K/uL   RBC 4.53 3.87 - 5.11 MIL/uL   Hemoglobin 13.9 12.0 - 15.0 g/dL   HCT 41.7 36 - 46 %   MCV 92.1 80.0 - 100.0 fL   MCH 30.7 26.0 - 34.0 pg   MCHC 33.3 30.0 - 36.0 g/dL   RDW 13.7 11.5 - 15.5 %   Platelets 303 150 - 400 K/uL   nRBC 0.0 0.0 - 0.2 %    Comment: Performed at [facility], [address] [place], [place] [address]  Basic metabolic panel      Status: Abnormal   Collection Time: 09/27/20  9:52 AM  Result Value Ref Range   Sodium 137 135 - 145 mmol/L   Potassium 3.8 3.5 - 5.1 mmol/L   Chloride 100 98 - 111 mmol/L   CO2 27 22 - 32 mmol/L   Glucose, Bld 123 (H) 70 - 99 mg/dL    Comment: Glucose reference range applies only to samples taken after fasting for at least 8 hours.   BUN 14 8 - 23 mg/dL   Creatinine, Ser 0.95 0.44 - 1.00 mg/dL   Calcium 9.6 8.9 - 10.3 mg/dL   GFR calc non Af Amer >60 >60 mL/min   Anion gap 10 5 - 15    Comment: Performed at [facility], [address] [place], [place] [address]  Digoxin level     Status: Abnormal   Collection Time: 09/27/20  9:52 AM  Result Value Ref Range   Digoxin Level 0.9 (L) 1.0 - 2.0 ng/mL    Comment: Performed at [facility], [address] [place], [place] [address]    Radiology: DG Chest 2 View  Result Date: 10/05/2020 CLINICAL DATA:  Shortness of breath. EXAM: CHEST - 2 VIEW COMPARISON:  September 11, 2019. FINDINGS: The heart size and mediastinal contours are within normal limits. Both lungs are clear. No pneumothorax or pleural effusion is noted. The visualized skeletal structures are unremarkable. IMPRESSION: No active cardiopulmonary disease. Electronically Signed   By: Marijo Conception M.D.   On: 10/05/2020 10:10    No results found.  DG Chest 2 View  Result Date: 10/05/2020 CLINICAL DATA:  Shortness of breath. EXAM: CHEST - 2 VIEW COMPARISON:  September 11, 2019. FINDINGS: The heart size and mediastinal contours are within normal limits. Both lungs are clear. No pneumothorax or pleural effusion is noted. The visualized skeletal structures are unremarkable. IMPRESSION: No active cardiopulmonary disease. Electronically Signed   By: Marijo Conception M.D.   On: 10/05/2020 10:10      Assessment and Plan: Patient Active Problem List   Diagnosis Date Noted  . Lymphedema 06/07/2020  . Acute right-sided low back pain with right-sided sciatica  05/21/2020  . Chronic systolic heart failure ([place])   . Pulmonary hypertension, unspecified ([place])   . Nutritional deficiency 01/02/2020  . Memory deficit 01/02/2020  . Encounter for therapeutic drug level monitoring 01/02/2020  . B12 deficiency 01/02/2020  . PAD (peripheral artery disease) ([place]) 12/05/2019  . Intractable vomiting 10/09/2019  . Intestinal ischemia ([place])   . COPD with acute exacerbation ([place])   . Atrial fibrillation with rapid ventricular response ([place]) 09/11/2019  . Enteritis   . Peripheral edema 07/17/2019  . Atherosclerosis of both carotid arteries 07/17/2019  . Varicose veins of right lower extremity with inflammation 06/08/2019  . Gastroesophageal reflux disease with esophagitis 05/08/2019  . Fall at home, initial encounter 03/16/2019  . Contusion of right lower leg 03/16/2019  . Flu-like symptoms 02/03/2019  . Nausea 02/03/2019  . Suprapatellar bursitis of right knee 01/23/2019  . Pain in right leg 01/23/2019  . Cellulitis of right leg 01/23/2019  . Chronic venous stasis dermatitis of right lower extremity 01/23/2019  . Oropharyngeal candidiasis 01/23/2019  . Cellulitis of head except face 12/23/2018  . Screening for breast cancer 08/15/2018  . Chronic bronchitis with acute exacerbation ([place]) 08/15/2018  . Vitamin D deficiency 08/15/2018  . Need for vaccination against Streptococcus pneumoniae using pneumococcal conjugate vaccine 13 08/15/2018  . Obstructive chronic bronchitis without exacerbation ([place]) 07/14/2018  . Body mass index (BMI) 45.0-49.9, adult 03/17/2018  . Asthma 02/25/2018  . Generalized anxiety disorder 12/16/2017  . Essential hypertension 12/16/2017  . Chronic respiratory failure with hypoxia ([place]) 12/16/2017  . Long term (current) use of anticoagulants 12/16/2017  . Chronic atrial fibrillation ([place]) 12/16/2017  . Other specified functional intestinal disorders 12/16/2017  . Gastro-esophageal reflux disease without esophagitis 12/16/2017  .  OSA on CPAP 12/16/2017  . Allergic rhinitis due to animal (cat) (dog) hair and dander 12/16/2017  . Allergic rhinitis due to pollen 12/16/2017  . Severe persistent asthma without complication 19/62/2297  . Cough 12/16/2017  . SOB (shortness of breath) 12/16/2017  . Superficial thrombophlebitis of right leg 11/17/2016  . Pain in limb 11/17/2016  . Chronic venous insufficiency 11/17/2016  . Swelling of limb 11/17/2016  . Cellulitis of buttock   . Cellulitis of right axilla   . Sepsis ([place]) 06/28/2016  . Cellulitis and abscess 06/28/2016  . Hypokalemia 06/28/2016  . Acute renal insufficiency 06/28/2016  . Hyperglycemia 06/28/2016  . Leukocytosis 06/28/2016  . Hypoxia 06/28/2016  . Collagenous colitis 05/13/2016    1. OSA on CPAP Continue with current CPAP settings and continue monitoring  2. CPAP use counseling Discussed importance of adequate CPAP use as well as proper care and cleaning techniques of machine and all supplies.  3. Mild intermittent asthma without complication Recent PFT FEV1 1.36L, 62% predicted Moderate obstructive lung disease Symptoms stable at this time with nebulizer therapy, will continue to monitor  4. Morbid obesity ([place]) Again, discussed the importance of weight management through healthy eating  habits as well as daily exercise as tolerated BMI 46  General Counseling: I have discussed the findings of the evaluation and examination with [name].  I have also discussed any further diagnostic evaluation thatmay be needed or ordered today. Adaiah verbalizes understanding of the findings of todays visit. We also reviewed her medications today and discussed drug interactions and side effects including but not limited excessive drowsiness and altered mental states. We also discussed that there is always a risk not just to her but also people around her. she has been encouraged to call the office with any questions or concerns that should arise related to todays  visit.     Time spent: 30  I have personally obtained a history, examined the patient, evaluated laboratory and imaging results, formulated the assessment and plan and placed orders. This patient was seen by Casey Burkitt AGNP-C in Collaboration with Dr. Devona Konig as a part of collaborative care agreement.    Allyne Gee, MD [facility] Pulmonary and Critical Care Sleep medicine

## 2020-10-10 ENCOUNTER — Telehealth: Payer: Self-pay

## 2020-10-10 NOTE — Telephone Encounter (Signed)
Confirmed and screened for 10-11-20 ov.

## 2020-10-11 ENCOUNTER — Encounter: Payer: Self-pay | Admitting: Hospice and Palliative Medicine

## 2020-10-11 ENCOUNTER — Ambulatory Visit (INDEPENDENT_AMBULATORY_CARE_PROVIDER_SITE_OTHER): Payer: Medicare Other | Admitting: Hospice and Palliative Medicine

## 2020-10-11 ENCOUNTER — Other Ambulatory Visit: Payer: Self-pay

## 2020-10-11 ENCOUNTER — Ambulatory Visit: Payer: Medicare Other | Admitting: Nurse Practitioner

## 2020-10-11 DIAGNOSIS — D519 Vitamin B12 deficiency anemia, unspecified: Secondary | ICD-10-CM | POA: Diagnosis not present

## 2020-10-11 DIAGNOSIS — R0609 Other forms of dyspnea: Secondary | ICD-10-CM

## 2020-10-11 DIAGNOSIS — R5383 Other fatigue: Secondary | ICD-10-CM | POA: Diagnosis not present

## 2020-10-11 DIAGNOSIS — D520 Dietary folate deficiency anemia: Secondary | ICD-10-CM

## 2020-10-11 DIAGNOSIS — D539 Nutritional anemia, unspecified: Secondary | ICD-10-CM

## 2020-10-11 DIAGNOSIS — J209 Acute bronchitis, unspecified: Secondary | ICD-10-CM

## 2020-10-11 DIAGNOSIS — D538 Other specified nutritional anemias: Secondary | ICD-10-CM

## 2020-10-11 DIAGNOSIS — R0600 Dyspnea, unspecified: Secondary | ICD-10-CM

## 2020-10-11 DIAGNOSIS — J42 Unspecified chronic bronchitis: Secondary | ICD-10-CM

## 2020-10-11 MED ORDER — AZITHROMYCIN 250 MG PO TABS: ORAL_TABLET | ORAL | 0 refills | Status: DC

## 2020-10-11 MED ORDER — CYANOCOBALAMIN 1000 MCG/ML IJ SOLN
1000.0000 ug | Freq: Once | INTRAMUSCULAR | Status: AC
  Administered 2020-10-11: 1000 ug via INTRAMUSCULAR

## 2020-10-11 NOTE — Progress Notes (Signed)
[facility] [place], [place] [address]  Internal MEDICINE  Office Visit Note  Patient Name: Laura [name]  [id number]  [id number]  Date of Service: 10/15/2020  Chief Complaint  Patient presents with  . Acute Visit    b12, other vitamins what will help dissolve meds, low fluid level, discuss bp, tired, no energy  . Dizziness  . Fatigue  . Hypertension  . Hyperlipidemia  . Asthma  . Quality Metric Gaps    mammogram, dexa  . policy update form     HPI Pt is here for a sick visit. Over the last several months she has been struggling with increased fatigue Since recovering from her small bowel resection she had last year in 08/2019 she has been struggling with fatigue and just not feeling well She is tired of not feeling good and always having a lack of energy She is unsure of the vitamins or minerals she should be taking due to absorption issues She is also concerned about all of the medications she has been put on recently and if she is absorbing them properly or if they are interacting with one another causing her to feel "off" History of OSA-100% compliance with CPAP Also has extensive cardiac history-CHF, chronic A. Fib, HTN Was taking furosemide as well as spironolactone--both were recently discontinued due to dehydration?   She is also complaining today of chest congestion as well as wheezing History of asthma--was seen earlier this week for pulmonary and was not experiencing these symptoms at that time  Current Medication:  Outpatient Encounter Medications as of 10/11/2020  Medication Sig  . albuterol (VENTOLIN HFA) 108 (90 Base) MCG/ACT inhaler Inhale 2 puffs into the lungs every 4 (four) hours as needed for wheezing or shortness of breath.  . ALPRAZolam (XANAX) 0.25 MG tablet Take 1 tablet (0.25 mg total) by mouth 2 (two) times daily.  Marland Kitchen apixaban (ELIQUIS) 5 MG TABS tablet Take 1 tablet (5 mg total) by mouth 2 (two) times daily.  .  benzonatate (TESSALON) 100 MG capsule Take 1 capsule (100 mg total) by mouth 2 (two) times daily as needed for cough.  . cyanocobalamin (,VITAMIN B-12,) 1000 MCG/ML injection Inject 1,000 mcg into the muscle every 30 (thirty) days.  . digoxin (LANOXIN) 0.125 MG tablet Take 1 tablet (0.125 mg total) by mouth daily.  Marland Kitchen diltiazem (CARDIZEM CD) 360 MG 24 hr capsule Take 1 capsule (360 mg total) by mouth daily.  Marland Kitchen EPINEPHrine 0.3 mg/0.3 mL IJ SOAJ injection Inject 0.3 mg into the muscle as needed for anaphylaxis.   Marland Kitchen ipratropium-albuterol (DUONEB) 0.5-2.5 (3) MG/3ML SOLN Take 3 mLs by nebulization every 4 (four) hours as needed.  Marland Kitchen losartan (COZAAR) 25 MG tablet Take 1 tablet (25 mg total) by mouth daily.  . montelukast (SINGULAIR) 10 MG tablet Take 1 tablet (10 mg total) by mouth daily.  Marland Kitchen omeprazole (PRILOSEC) 40 MG capsule Take 1 capsule (40 mg total) by mouth daily.  . ondansetron (ZOFRAN) 4 MG tablet Take 1 tablet (4 mg total) by mouth every 8 (eight) hours as needed for nausea or vomiting.  . predniSONE (DELTASONE) 10 MG tablet Take 1 tablet (10 mg total) by mouth daily with breakfast.  . Probiotic Product (PROBIOTIC-10) CAPS Take 1 capsule by mouth daily.   . theophylline (THEODUR) 300 MG 12 hr tablet Take 1 tablet (300 mg total) by mouth 2 (two) times daily.  . traMADol (ULTRAM) 50 MG tablet Take 50 mg by mouth every 6 (  six) hours.  Marland Kitchen azithromycin (ZITHROMAX) 250 MG tablet Take one tablet daily for 10 days  . budesonide (PULMICORT) 0.25 MG/2ML nebulizer solution Take 2 mLs (0.25 mg total) by nebulization 2 (two) times daily. (Patient not taking: Reported on 10/11/2020)  . cholestyramine (QUESTRAN) 4 g packet Take 1 packet (4 g total) by mouth 2 (two) times daily.  . Multiple Vitamin (MULTIVITAMIN WITH MINERALS) TABS tablet Take 1 tablet by mouth daily. (Patient not taking: Reported on 10/11/2020)  . rosuvastatin (CRESTOR) 20 MG tablet Take 1 tablet (20 mg total) by mouth daily.  .  [DISCONTINUED] azithromycin (ZITHROMAX) 250 MG tablet z-pack - take as directed for 5 days (Patient not taking: Reported on 10/11/2020)   Facility-Administered Encounter Medications as of 10/11/2020  Medication Note  . cyanocobalamin ((VITAMIN B-12)) injection 1,000 mcg 02/27/2020: Due 02/28/2020  . [COMPLETED] cyanocobalamin ((VITAMIN B-12)) injection 1,000 mcg       Medical History: Past Medical History:  Diagnosis Date  . Asthma   . Chronic atrial fibrillation ([place])    a. diagnosed in 09/2016; b. failed flecainide and propafenone due to LE swelling and SOB, could not afford Multaq; c. CHADS2VASc => 5 (CHF, HTN, age x 1, nonobs CAD, female)--> Eliquis  . COPD (chronic obstructive pulmonary disease) ([place])   . GERD (gastroesophageal reflux disease)   . HFmrEF (heart failure with mid-range ejection fraction) ([place])    a. 12/2019 Echo: EF 40-45%.  . Hyperlipidemia   . Hypertension   . NICM (nonischemic cardiomyopathy) ([place])    a. 12/2019 Echo: EF 40-45%, glob HK, mildly reduced RV fxn, sev dil LA. *HR 130 (afib) during study.  . Nonobstructive CAD (coronary artery disease)    a. Lexiscan Myoview 10/2016: no evidence of ischemia, EF 53%; b. 02/2020 Cath: LM nl, LAD 20p, 6m, LCX 20ost, OM1/2/3 nl, LPDA nl, LPL1/2 nl, LPAV nl, RCA small, nl.  . Obesity   . Obstructive sleep apnea   . Pulmonary hypertension ([place])   . Systolic dysfunction    a. TTE 10/2016: EF 50%, mild LVH, moderately dilated LA, moderate MR/TR, mild pulmonary hypertension    Vital Signs: BP (!) 130/52   Pulse 63   Temp 98.4 F (36.9 C)   Resp 16   Ht 5\' 4"  (1.626 m)   Wt 270 lb 3.2 oz (122.6 kg)   LMP  (LMP Unknown)   SpO2 98%   BMI 46.38 kg/m    Review of Systems  Constitutional: Positive for activity change, appetite change and fatigue. Negative for chills and diaphoresis.  HENT: Positive for congestion, sinus pressure and sinus pain. Negative for ear pain and postnasal drip.   Eyes: Negative for photophobia,  discharge, redness, itching and visual disturbance.  Respiratory: Positive for shortness of breath and wheezing. Negative for cough.   Cardiovascular: Negative for chest pain, palpitations and leg swelling.  Gastrointestinal: Negative for abdominal pain, constipation, diarrhea, nausea and vomiting.  Endocrine: Negative for polyphagia and polyuria.  Genitourinary: Negative for dysuria and flank pain.  Musculoskeletal: Negative for arthralgias, back pain, gait problem and neck pain.  Skin: Negative for color change.  Allergic/Immunologic: Negative for environmental allergies and food allergies.  Neurological: Negative for dizziness and headaches.  Hematological: Does not bruise/bleed easily.  Psychiatric/Behavioral: Negative for agitation, behavioral problems (depression) and hallucinations.   Physical Exam Constitutional:      Appearance: She is obese.  Cardiovascular:     Rate and Rhythm: Normal rate. Rhythm irregular.     Pulses: Normal pulses.  Heart sounds: Normal heart sounds.  Pulmonary:     Breath sounds: Examination of the right-middle field reveals wheezing. Examination of the left-middle field reveals wheezing. Examination of the right-lower field reveals wheezing. Examination of the left-lower field reveals wheezing. Wheezing present.  Musculoskeletal:        General: Normal range of motion.  Skin:    General: Skin is warm.  Neurological:     General: No focal deficit present.     Mental Status: She is alert and oriented to person, place, and time. Mental status is at baseline.  Psychiatric:        Mood and Affect: Mood normal.        Behavior: Behavior normal.        Thought Content: Thought content normal.    Assessment/Plan: 1. Chronic bronchitis with acute exacerbation ([place]) Will treat respiratory symptoms with Azithromycin for 10 days--will follow closely for resolution of symptoms - azithromycin (ZITHROMAX) 250 MG tablet; Take one tablet daily for 10 days   Dispense: 10 tablet; Refill: 0  2. Dyspnea on exertion Last EF 40-45%, will obtain updated echo, known history of PAH--has been off of all diuretics for about 3 weeks Advised to restart spironolactone at this time--will review labs and echo and will adjust plan of care as indicated - ECHOCARDIOGRAM COMPLETE; Future  3. Fatigue, unspecified type Will review labs--likely related to multiple comorbid conditions Will adjust plan of care as indicated Overnight pulse oximetry--advised to wear CPAP for this study, will evaluate potential further hypoxia during sleep - Comprehensive Metabolic Panel (CMET) - CBC w/Diff/Platelet - Vitamin B12 - Folate - Iron and TIBC - Ferritin - Pulse oximetry, overnight; Future  4. Vitamin B12 deficiency anemia, unspecified  - cyanocobalamin ((VITAMIN B-12)) injection 1,000 mcg  5. Other specified nutritional anemias  Will review all labs--may need to further discuss with GI about deficiencies in her diet related to her surgery May benefit from nutritional counselor support - Vitamin B12 - Folate - Iron and TIBC - Ferritin  General Counseling: Laura [name] verbalizes understanding of the findings of todays visit and agrees with plan of treatment. I have discussed any further diagnostic evaluation that may be needed or ordered today. We also reviewed her medications today. she has been encouraged to call the office with any questions or concerns that should arise related to todays visit.   Orders Placed This Encounter  Procedures  . Comprehensive Metabolic Panel (CMET)  . CBC w/Diff/Platelet  . Vitamin B12  . Folate  . Iron and TIBC  . Ferritin  . Pulse oximetry, overnight  . ECHOCARDIOGRAM COMPLETE    Meds ordered this encounter  Medications  . azithromycin (ZITHROMAX) 250 MG tablet    Sig: Take one tablet daily for 10 days    Dispense:  10 tablet    Refill:  0  . cyanocobalamin ((VITAMIN B-12)) injection 1,000 mcg    Time spent: 30  Minutes Time spent includes review of chart, medications, test results and follow-up plan with the patient.  This patient was seen by Theodoro Grist AGNP-C in Collaboration with Dr [name] as a part of collaborative care agreement.  Tanna Furry [facility] Internal Medicine

## 2020-10-12 ENCOUNTER — Encounter: Payer: Self-pay | Admitting: Internal Medicine

## 2020-10-12 NOTE — Patient Instructions (Signed)

## 2020-10-13 LAB — CBC WITH DIFFERENTIAL/PLATELET
Basophils Absolute: 0.1 10*3/uL (ref 0.0–0.2)
Basos: 1 %
EOS (ABSOLUTE): 0.6 10*3/uL — ABNORMAL HIGH (ref 0.0–0.4)
Eos: 7 %
Hematocrit: 43.6 % (ref 34.0–46.6)
Hemoglobin: 14.2 g/dL (ref 11.1–15.9)
Immature Grans (Abs): 0 10*3/uL (ref 0.0–0.1)
Immature Granulocytes: 1 %
Lymphocytes Absolute: 3.6 10*3/uL — ABNORMAL HIGH (ref 0.7–3.1)
Lymphs: 43 %
MCH: 30.5 pg (ref 26.6–33.0)
MCHC: 32.6 g/dL (ref 31.5–35.7)
MCV: 94 fL (ref 79–97)
Monocytes Absolute: 0.9 10*3/uL (ref 0.1–0.9)
Monocytes: 11 %
Neutrophils Absolute: 3 10*3/uL (ref 1.4–7.0)
Neutrophils: 37 %
Platelets: 357 10*3/uL (ref 150–450)
RBC: 4.66 x10E6/uL (ref 3.77–5.28)
RDW: 14 % (ref 11.7–15.4)
WBC: 8.1 10*3/uL (ref 3.4–10.8)

## 2020-10-13 LAB — COMPREHENSIVE METABOLIC PANEL
ALT: 14 IU/L (ref 0–32)
AST: 14 IU/L (ref 0–40)
Albumin/Globulin Ratio: 1.6 (ref 1.2–2.2)
Albumin: 3.9 g/dL (ref 3.8–4.8)
Alkaline Phosphatase: 74 IU/L (ref 44–121)
BUN/Creatinine Ratio: 21 (ref 12–28)
BUN: 16 mg/dL (ref 8–27)
Bilirubin Total: 0.5 mg/dL (ref 0.0–1.2)
CO2: 23 mmol/L (ref 20–29)
Calcium: 9.5 mg/dL (ref 8.7–10.3)
Chloride: 101 mmol/L (ref 96–106)
Creatinine, Ser: 0.78 mg/dL (ref 0.57–1.00)
GFR calc Af Amer: 89 mL/min/{1.73_m2} (ref >59)
GFR calc non Af Amer: 77 mL/min/{1.73_m2} (ref >59)
Globulin, Total: 2.5 g/dL (ref 1.5–4.5)
Glucose: 115 mg/dL — ABNORMAL HIGH (ref 65–99)
Potassium: 4.3 mmol/L (ref 3.5–5.2)
Sodium: 140 mmol/L (ref 134–144)
Total Protein: 6.4 g/dL (ref 6.0–8.5)

## 2020-10-13 LAB — IRON AND TIBC
Iron Saturation: 24 % (ref 15–55)
Iron: 85 ug/dL (ref 27–139)
Total Iron Binding Capacity: 347 ug/dL (ref 250–450)
UIBC: 262 ug/dL (ref 118–369)

## 2020-10-13 LAB — FERRITIN: Ferritin: 49 ng/mL (ref 15–150)

## 2020-10-13 LAB — VITAMIN B12: Vitamin B-12: 1736 pg/mL — ABNORMAL HIGH (ref 232–1245)

## 2020-10-13 LAB — FOLATE: Folate: 14.4 ng/mL (ref >3.0)

## 2020-10-15 ENCOUNTER — Encounter: Payer: Self-pay | Admitting: Hospice and Palliative Medicine

## 2020-10-24 ENCOUNTER — Ambulatory Visit (INDEPENDENT_AMBULATORY_CARE_PROVIDER_SITE_OTHER): Payer: Medicare Other | Admitting: Internal Medicine

## 2020-10-24 ENCOUNTER — Other Ambulatory Visit: Payer: Self-pay

## 2020-10-24 ENCOUNTER — Encounter: Payer: Self-pay | Admitting: Internal Medicine

## 2020-10-24 VITALS — BP 156/62 | HR 51 | Temp 97.2°F | Resp 16 | Ht 64.0 in | Wt 268.0 lb

## 2020-10-24 DIAGNOSIS — I10 Essential (primary) hypertension: Secondary | ICD-10-CM

## 2020-10-24 DIAGNOSIS — R7301 Impaired fasting glucose: Secondary | ICD-10-CM

## 2020-10-24 DIAGNOSIS — E2839 Other primary ovarian failure: Secondary | ICD-10-CM

## 2020-10-24 DIAGNOSIS — Z79899 Other long term (current) drug therapy: Secondary | ICD-10-CM | POA: Diagnosis not present

## 2020-10-24 DIAGNOSIS — G4733 Obstructive sleep apnea (adult) (pediatric): Secondary | ICD-10-CM

## 2020-10-24 DIAGNOSIS — Z9989 Dependence on other enabling machines and devices: Secondary | ICD-10-CM

## 2020-10-24 DIAGNOSIS — J4520 Mild intermittent asthma, uncomplicated: Secondary | ICD-10-CM

## 2020-10-24 DIAGNOSIS — I482 Chronic atrial fibrillation, unspecified: Secondary | ICD-10-CM | POA: Diagnosis not present

## 2020-10-24 DIAGNOSIS — I700 Atherosclerosis of aorta: Secondary | ICD-10-CM

## 2020-10-24 LAB — POCT URINE DRUG SCREEN
Methylenedioxyamphetamine: NOT DETECTED
POC Amphetamine UR: NOT DETECTED
POC BENZODIAZEPINES UR: NOT DETECTED
POC Barbiturate UR: NOT DETECTED
POC Cocaine UR: NOT DETECTED
POC Ecstasy UR: NOT DETECTED
POC Marijuana UR: NOT DETECTED
POC Methadone UR: NOT DETECTED
POC Methamphetamine UR: NOT DETECTED
POC Opiate Ur: NOT DETECTED
POC Oxycodone UR: NOT DETECTED
POC PHENCYCLIDINE UR: NOT DETECTED
POC TRICYCLICS UR: POSITIVE — AB

## 2020-10-24 LAB — POCT GLYCOSYLATED HEMOGLOBIN (HGB A1C): Hemoglobin A1C: 6.3 % — AB (ref 4.0–5.6)

## 2020-10-24 NOTE — Progress Notes (Signed)
[facility] [place], [place] [address]  Internal MEDICINE  Office Visit Note  Patient Name: Laura [name]  [id number]  [id number]  Date of Service: 10/29/2020  Chief Complaint  Patient presents with  . Follow-up    reviewing test results  . Hypertension  . Hyperlipidemia  . Asthma  . policy update form    reviewed  . Quality Metric Gaps    dexa, mammogram    HPI Pt is here for routine follow up and to discuss her recent lab work. Glucose is slightly elevated, all other labs are within normal limits. Asthma is under better control. OSA on CPAP Pt also has chronic atrial fib, chronic diastolic heart failure with midrange EF, well compensated  She has been having flare up for asthma but feels better. Continues to lose weight   Current Medication: Outpatient Encounter Medications as of 10/24/2020  Medication Sig  . Accu-Chek Softclix Lancets lancets Use as instructed to check blood sugars daily 2 hours after meal  . albuterol (VENTOLIN HFA) 108 (90 Base) MCG/ACT inhaler Inhale 2 puffs into the lungs every 4 (four) hours as needed for wheezing or shortness of breath.  . ALPRAZolam (XANAX) 0.25 MG tablet Take 1 tablet (0.25 mg total) by mouth 2 (two) times daily.  Marland Kitchen apixaban (ELIQUIS) 5 MG TABS tablet Take 1 tablet (5 mg total) by mouth 2 (two) times daily.  . benzonatate (TESSALON) 100 MG capsule Take 1 capsule (100 mg total) by mouth 2 (two) times daily as needed for cough.  . budesonide (PULMICORT) 0.25 MG/2ML nebulizer solution Take 2 mLs (0.25 mg total) by nebulization 2 (two) times daily.  . cyanocobalamin (,VITAMIN B-12,) 1000 MCG/ML injection Inject 1,000 mcg into the muscle every 30 (thirty) days.  . digoxin (LANOXIN) 0.125 MG tablet Take 1 tablet (0.125 mg total) by mouth daily.  Marland Kitchen diltiazem (CARDIZEM CD) 360 MG 24 hr capsule Take 1 capsule (360 mg total) by mouth daily.  Marland Kitchen EPINEPHrine 0.3 mg/0.3 mL IJ SOAJ injection Inject 0.3 mg into the  muscle as needed for anaphylaxis.   Marland Kitchen glucose blood (ACCU-CHEK GUIDE) test strip Use as instructed to check blood sugars daily 2 hours after meal  . ipratropium-albuterol (DUONEB) 0.5-2.5 (3) MG/3ML SOLN Take 3 mLs by nebulization every 4 (four) hours as needed.  Marland Kitchen losartan (COZAAR) 25 MG tablet Take 1 tablet (25 mg total) by mouth daily.  . montelukast (SINGULAIR) 10 MG tablet Take 1 tablet (10 mg total) by mouth daily.  Marland Kitchen omeprazole (PRILOSEC) 40 MG capsule Take 1 capsule (40 mg total) by mouth daily.  . ondansetron (ZOFRAN) 4 MG tablet Take 1 tablet (4 mg total) by mouth every 8 (eight) hours as needed for nausea or vomiting.  . predniSONE (DELTASONE) 10 MG tablet Take 1 tablet (10 mg total) by mouth daily with breakfast.  . Probiotic Product (PROBIOTIC-10) CAPS Take 1 capsule by mouth daily.   . rosuvastatin (CRESTOR) 20 MG tablet Take 1 tablet (20 mg total) by mouth daily.  . theophylline (THEODUR) 300 MG 12 hr tablet Take 1 tablet (300 mg total) by mouth 2 (two) times daily.  . traMADol (ULTRAM) 50 MG tablet Take 50 mg by mouth every 6 (six) hours.  . [DISCONTINUED] azithromycin (ZITHROMAX) 250 MG tablet Take one tablet daily for 10 days (Patient not taking: Reported on 10/29/2020)  . [DISCONTINUED] cholestyramine (QUESTRAN) 4 g packet Take 1 packet (4 g total) by mouth 2 (two) times daily.  . [DISCONTINUED] Multiple  Vitamin (MULTIVITAMIN WITH MINERALS) TABS tablet Take 1 tablet by mouth daily. (Patient not taking: Reported on 10/11/2020)   Facility-Administered Encounter Medications as of 10/24/2020  Medication Note  . cyanocobalamin ((VITAMIN B-12)) injection 1,000 mcg 02/27/2020: Due 02/28/2020    Surgical History: Past Surgical History:  Procedure Laterality Date  . BOWEL RESECTION  09/11/2019   Procedure: SMALL BOWEL RESECTION;  Surgeon: Herbert Pun, MD;  Location: [facility] ORS;  Service: General;;  . CARDIAC CATHETERIZATION    . cataract surgery    . COLONOSCOPY WITH PROPOFOL  N/A 03/19/2020   Procedure: COLONOSCOPY WITH PROPOFOL;  Surgeon: [name], MD;  Location: [facility] ENDOSCOPY;  Service: Gastroenterology;  Laterality: N/A;  . CORONARY ANGIOPLASTY    . INCISION AND DRAINAGE ABSCESS Right 06/29/2016   Procedure: INCISION AND DRAINAGE ABSCESS;  Surgeon: Florene Glen, MD;  Location: [facility] ORS;  Service: General;  Laterality: Right;  . INCISION AND DRAINAGE OF WOUND Left 06/29/2016   Procedure: IRRIGATION AND DEBRIDEMENT WOUND;  Surgeon: Florene Glen, MD;  Location: [facility] ORS;  Service: General;  Laterality: Left;  . LAPAROSCOPIC RIGHT COLECTOMY  09/11/2019   Procedure: RIGHT COLECTOMY;  Surgeon: Herbert Pun, MD;  Location: [facility] ORS;  Service: General;;  . LAPAROSCOPY N/A 09/11/2019   Procedure: LAPAROSCOPY DIAGNOSTIC;  Surgeon: Herbert Pun, MD;  Location: [facility] ORS;  Service: General;  Laterality: N/A;  . LAPAROTOMY N/A 09/13/2019   Procedure: REOPENING OF RECENT LAPAROTOMYANASTOMOSIS OF BOWEL;  Surgeon: Herbert Pun, MD;  Location: [facility] ORS;  Service: General;  Laterality: N/A;  . RIGHT/LEFT HEART CATH AND CORONARY ANGIOGRAPHY Bilateral 02/27/2020   Procedure: RIGHT/LEFT HEART CATH AND CORONARY ANGIOGRAPHY;  Surgeon: Wellington Hampshire, MD;  Location: [place] [facility];  Service: Cardiovascular;  Laterality: Bilateral;  . VISCERAL ANGIOGRAPHY N/A 09/12/2019   Procedure: VISCERAL ANGIOGRAPHY;  Surgeon: Algernon Huxley, MD;  Location: [place] [facility];  Service: Cardiovascular;  Laterality: N/A;    Medical History: Past Medical History:  Diagnosis Date  . Asthma   . Chronic atrial fibrillation ([place])    a. diagnosed in 09/2016; b. failed flecainide and propafenone due to LE swelling and SOB, could not afford Multaq; c. CHADS2VASc => 5 (CHF, HTN, age x 1, nonobs CAD, female)--> Eliquis  . COPD (chronic obstructive pulmonary disease) ([place])   . GERD (gastroesophageal reflux disease)   . HFmrEF (heart failure with mid-range ejection  fraction) ([place])    a. 12/2019 Echo: EF 40-45%.  . Hyperlipidemia   . Hypertension   . NICM (nonischemic cardiomyopathy) ([place])    a. 12/2019 Echo: EF 40-45%, glob HK, mildly reduced RV fxn, sev dil LA. *HR 130 (afib) during study.  . Nonobstructive CAD (coronary artery disease)    a. Lexiscan Myoview 10/2016: no evidence of ischemia, EF 53%; b. 02/2020 Cath: LM nl, LAD 20p, 18m, LCX 20ost, OM1/2/3 nl, LPDA nl, LPL1/2 nl, LPAV nl, RCA small, nl.  . Obesity   . Obstructive sleep apnea   . Pulmonary hypertension ([place])   . Systolic dysfunction    a. TTE 10/2016: EF 50%, mild LVH, moderately dilated LA, moderate MR/TR, mild pulmonary hypertension    Family History: Family History  Problem Relation Age of Onset  . Dementia Mother   . Osteoporosis Mother   . Vascular Disease Mother   . COPD Father     Social History   Socioeconomic History  . Marital status: Married    Spouse name: [name]   . Number of children: 2  . Years  of education: Not on file  . Highest education level: Not on file  Occupational History  . Not on file  Tobacco Use  . Smoking status: Never Smoker  . Smokeless tobacco: Never Used  Vaping Use  . Vaping Use: Never used  Substance and Sexual Activity  . Alcohol use: No  . Drug use: No  . Sexual activity: Not on file  Other Topics Concern  . Not on file  Social History Narrative   3 grandchildren: 7 great grandkids.    Social Determinants of Health   Financial Resource Strain:   . Difficulty of Paying Living Expenses: Not on file  Food Insecurity:   . Worried About Charity fundraiser in the Last Year: Not on file  . Ran Out of Food in the Last Year: Not on file  Transportation Needs:   . Lack of Transportation (Medical): Not on file  . Lack of Transportation (Non-Medical): Not on file  Physical Activity:   . Days of Exercise per Week: Not on file  . Minutes of Exercise per Session: Not on file  Stress:   . Feeling of Stress : Not on file  Social  Connections:   . Frequency of Communication with Friends and Family: Not on file  . Frequency of Social Gatherings with Friends and Family: Not on file  . Attends Religious Services: Not on file  . Active Member of Clubs or Organizations: Not on file  . Attends Archivist Meetings: Not on file  . Marital Status: Not on file  Intimate Partner Violence:   . Fear of Current or Ex-Partner: Not on file  . Emotionally Abused: Not on file  . Physically Abused: Not on file  . Sexually Abused: Not on file      Review of Systems  Constitutional: Negative for chills, diaphoresis and fatigue.  HENT: Negative for ear pain, postnasal drip and sinus pressure.   Eyes: Negative for photophobia, discharge, redness, itching and visual disturbance.  Respiratory: Positive for shortness of breath. Negative for cough and wheezing.   Cardiovascular: Negative for chest pain, palpitations and leg swelling.  Gastrointestinal: Negative for abdominal pain, constipation, diarrhea, nausea and vomiting.  Genitourinary: Negative for dysuria and flank pain.  Musculoskeletal: Negative for arthralgias, back pain, gait problem and neck pain.  Skin: Negative for color change.  Allergic/Immunologic: Negative for environmental allergies and food allergies.  Neurological: Negative for dizziness and headaches.  Hematological: Does not bruise/bleed easily.  Psychiatric/Behavioral: Negative for agitation, behavioral problems (depression) and hallucinations.    Vital Signs: BP (!) 156/62   Pulse (!) 51   Temp (!) 97.2 F (36.2 C)   Resp 16   Ht 5\' 4"  (1.626 m)   Wt 268 lb (121.6 kg)   LMP  (LMP Unknown)   SpO2 98%   BMI 46.00 kg/m    Physical Exam Constitutional:      General: She is not in acute distress.    Appearance: She is well-developed. She is not diaphoretic.  HENT:     Head: Normocephalic and atraumatic.     Mouth/Throat:     Pharynx: No oropharyngeal exudate.  Eyes:     Pupils: Pupils  are equal, round, and reactive to light.  Neck:     Thyroid: No thyromegaly.     Vascular: No JVD.     Trachea: No tracheal deviation.  Cardiovascular:     Rate and Rhythm: Normal rate and regular rhythm.     Heart sounds: Normal  heart sounds. No murmur heard.  No friction rub. No gallop.   Pulmonary:     Effort: Pulmonary effort is normal. No respiratory distress.     Breath sounds: No wheezing or rales.  Chest:     Chest wall: No tenderness.  Abdominal:     General: Bowel sounds are normal.     Palpations: Abdomen is soft.  Musculoskeletal:        General: Normal range of motion.     Cervical back: Normal range of motion and neck supple.  Lymphadenopathy:     Cervical: No cervical adenopathy.  Skin:    General: Skin is warm and dry.  Neurological:     Mental Status: She is alert and oriented to person, place, and time.     Cranial Nerves: No cranial nerve deficit.  Psychiatric:        Behavior: Behavior normal.        Thought Content: Thought content normal.        Judgment: Judgment normal.     Assessment/Plan: 1. Impaired fasting glucose Hg A1c is above 6.0, she will monitor her glucose, glucometer is also given. To keep a check on it - POCT HgB A1C  2. Chronic atrial fibrillation (HCC) followed by cardiology   3. Encounter for long-term (current) use of medications Pt is on Eliquis and prn alprazolam, done well no risk for potential  - POCT Urine Drug Screen  4. OSA on CPAP Compliance with cpap is excellent   5. Mild intermittent asthma without complication Controlled   6. Benign hypertension BP is improved  7. Other primary ovarian failure Will schedule BMD on next visit, avoid out pt visit due to covid 19, pt is high risk   8. Atherosclerosis of aorta ([place]) Continue Crestor as before   General Counseling: Sharifa verbalizes understanding of the findings of todays visit and agrees with plan of treatment. I have discussed any further diagnostic  evaluation that may be needed or ordered today. We also reviewed her medications today. she has been encouraged to call the office with any questions or concerns that should arise related to todays visit.    Orders Placed This Encounter  Procedures  . POCT HgB A1C  . POCT Urine Drug Screen    Meds ordered this encounter  Medications  . Accu-Chek Softclix Lancets lancets    Sig: Use as instructed to check blood sugars daily 2 hours after meal    Dispense:  100 each    Refill:  12  . glucose blood (ACCU-CHEK GUIDE) test strip    Sig: Use as instructed to check blood sugars daily 2 hours after meal    Dispense:  100 each    Refill:  12    Total time spent:  35 Minutes Time spent includes review of chart, medications, test results, and follow up plan with the patient.      Dr [name] Internal medicine

## 2020-10-25 MED ORDER — ACCU-CHEK SOFTCLIX LANCETS MISC: 12 refills | Status: DC

## 2020-10-25 MED ORDER — ACCU-CHEK GUIDE VI STRP: ORAL_STRIP | 12 refills | Status: DC

## 2020-10-28 NOTE — Progress Notes (Addendum)
Office Visit    Patient Name: Laura [name] Date of Encounter: 11/27/2020  Primary Care Provider:  [name], MD Primary Cardiologist:  Kathlyn Sacramento, MD Electrophysiologist:  None   Chief Complaint    Laura [name] is a 69 y.o. female with a hx of  chronic atrial fibrillation, obesity, OSA on CPAP, asthma, essential hypertension, HF with midrange EF, NICM, HLD, obesity, COPD, GERD, nonobstructive CAD presents today for follow-up of HF.  Past Medical History    Past Medical History:  Diagnosis Date  . Asthma   . Chronic atrial fibrillation ([place])    a. diagnosed in 09/2016; b. failed flecainide and propafenone due to LE swelling and SOB, could not afford Multaq; c. CHADS2VASc => 5 (CHF, HTN, age x 1, nonobs CAD, female)--> Eliquis  . COPD (chronic obstructive pulmonary disease) ([place])   . GERD (gastroesophageal reflux disease)   . HFmrEF (heart failure with mid-range ejection fraction) ([place])    a. 12/2019 Echo: EF 40-45%.  . Hyperlipidemia   . Hypertension   . NICM (nonischemic cardiomyopathy) ([place])    a. 12/2019 Echo: EF 40-45%, glob HK, mildly reduced RV fxn, sev dil LA. *HR 130 (afib) during study.  . Nonobstructive CAD (coronary artery disease)    a. Lexiscan Myoview 10/2016: no evidence of ischemia, EF 53%; b. 02/2020 Cath: LM nl, LAD 20p, 45m, LCX 20ost, OM1/2/3 nl, LPDA nl, LPL1/2 nl, LPAV nl, RCA small, nl.  . Obesity   . Obstructive sleep apnea   . Pulmonary hypertension ([place])   . Systolic dysfunction    a. TTE 10/2016: EF 50%, mild LVH, moderately dilated LA, moderate MR/TR, mild pulmonary hypertension   Past Surgical History:  Procedure Laterality Date  . BOWEL RESECTION  09/11/2019   Procedure: SMALL BOWEL RESECTION;  Surgeon: Herbert Pun, MD;  Location: [facility] ORS;  Service: General;;  . CARDIAC CATHETERIZATION    . cataract surgery    . COLONOSCOPY WITH PROPOFOL N/A 03/19/2020   Procedure: COLONOSCOPY WITH PROPOFOL;  Surgeon: [name], MD;   Location: [facility] ENDOSCOPY;  Service: Gastroenterology;  Laterality: N/A;  . CORONARY ANGIOPLASTY    . INCISION AND DRAINAGE ABSCESS Right 06/29/2016   Procedure: INCISION AND DRAINAGE ABSCESS;  Surgeon: Florene Glen, MD;  Location: [facility] ORS;  Service: General;  Laterality: Right;  . INCISION AND DRAINAGE OF WOUND Left 06/29/2016   Procedure: IRRIGATION AND DEBRIDEMENT WOUND;  Surgeon: Florene Glen, MD;  Location: [facility] ORS;  Service: General;  Laterality: Left;  . IR ANGIO VERTEBRAL SEL SUBCLAVIAN INNOMINATE UNI R MOD SED  11/20/2020  . IR CT HEAD LTD  11/20/2020  . IR PERCUTANEOUS ART THROMBECTOMY/INFUSION INTRACRANIAL INC DIAG ANGIO  11/20/2020  . LAPAROSCOPIC RIGHT COLECTOMY  09/11/2019   Procedure: RIGHT COLECTOMY;  Surgeon: Herbert Pun, MD;  Location: [facility] ORS;  Service: General;;  . LAPAROSCOPY N/A 09/11/2019   Procedure: LAPAROSCOPY DIAGNOSTIC;  Surgeon: Herbert Pun, MD;  Location: [facility] ORS;  Service: General;  Laterality: N/A;  . LAPAROTOMY N/A 09/13/2019   Procedure: REOPENING OF RECENT LAPAROTOMYANASTOMOSIS OF BOWEL;  Surgeon: Herbert Pun, MD;  Location: [facility] ORS;  Service: General;  Laterality: N/A;  . RADIOLOGY WITH ANESTHESIA N/A 11/20/2020   Procedure: IR WITH ANESTHESIA - CODE STROKE;  Surgeon: Radiologist, Medication, MD;  Location: [place];  Service: Radiology;  Laterality: N/A;  . RIGHT/LEFT HEART CATH AND CORONARY ANGIOGRAPHY Bilateral 02/27/2020   Procedure: RIGHT/LEFT HEART CATH AND CORONARY ANGIOGRAPHY;  Surgeon: Wellington Hampshire, MD;  Location:  [place] CV LAB;  Service: Cardiovascular;  Laterality: Bilateral;  . VISCERAL ANGIOGRAPHY N/A 09/12/2019   Procedure: VISCERAL ANGIOGRAPHY;  Surgeon: Algernon Huxley, MD;  Location: [place] CV LAB;  Service: Cardiovascular;  Laterality: N/A;    Allergies  Allergies  Allergen Reactions  . Flecainide Shortness Of Breath  . Metoprolol Shortness Of Breath, Swelling and Other (See Comments)     "My limbs swell" also  . Propafenone Shortness Of Breath, Swelling and Other (See Comments)    "My limbs swell" also  . Rivaroxaban Swelling and Other (See Comments)    Xarelto- "My limbs swell"    History of Present Illness    Laura [name] is a 69 y.o. female with a hx of chronic atrial fibrillation, obesity, OSA on CPAP, asthma, essential hypertension, HF with midrange EF, NICM, HLD, obesity, COPD, GERD, nonobstructive CAD. She was last seen 09/27/20.  Previous cardiac workup 2017 with pharmacologic nuclear stress test with no evidence of ischemia and EF 53%. Echo at that time with mildly reduced LVEF 50%, mild LVH, moderately dilated LA, moderate MR and TR with mild pulmonary hypertension.   Her atrial fibrillation is rate controlled as she was intolerant of multiple arrhythmics - previously failing flecainide and propafenone.  She has been previously intolerant of beta-blocker due to her lung disease. Also with trouble with Metoprolol and Rivaroxaban and as a result has been rate controlled on diltiazem and digoxin with Eliquis for anticoagulation.    Echo January 2021 with LVEF 40-45% and dilataed IVC. Subsequent CT chest dilated pulmonary artery suggestive of pulmonary hypertension. Underwent R/LHC 02/27/20 with mild nonobstructive disease, pulmonary hypertension related to left sided heart failure, wedge of 24 and PA of 43/24 (30). In follow up 03/20/20 Lasix was continued as renal function and electrolytes were stable. Lipid panel LDL 103 with Crstor 20mg  daily added.    Seen in clinic 05/10/20 doing well. Repeat lab work 05/16/20 with LDL 41. 06/01/20 she was transitioned to Warfarin due to cost, but her children then offered to pay for Eliquis and it was continued.  Seen in clinic 09/27/20 with 3 day history of nausea and lightheadedness. Her digoxin level was unremarkable. She was noted to have GI upset and encouraged to follow up with her PCP. Reported chest pain when laying down after  eating and encouraged to continue her Dexilant.   Since last seen she has been recommended for echo for monitoring of her [place] by pulmonology. This is upcoming 11/09/20. She tells me her gastrointestinal issues have improved. Her dizziness is better. She has been diagnosed with pre-diabetes. She is checking her sugar at home after adjusting her diet. She thinks some of her symptoms were related to this. Reports no chest pain, pressure, tightness. She has noticed reduced lower extremity edema.   EKGs/Labs/Other Studies Reviewed:   The following studies were reviewed today:  Cath 02/27/20  Ost Cx lesion is 20% stenosed.  Prox LAD lesion is 20% stenosed.  Prox LAD to Mid LAD lesion is 30% stenosed.   1.  Mild nonobstructive coronary artery disease.  Left dominant coronary arteries. 2.  Left ventricular angiography was not performed.  EF was mildly reduced by echo. 3.  Right heart catheterization showed moderately elevated filling pressures with an RA of 15 mmHg, pulmonary capillary wedge pressure of 24 mmHg, PA pressure of 43/24 with a mean of 30 mmHg.  Normal cardiac output at 5.18 with a cardiac index of 2.34.  Pulmonary vascular resistance was only 1.16  [place] units.   Recommendations: No significant coronary artery disease to require revascularization. Pulmonary hypertension seems to be all related to left sided heart failure.  The patient is volume overloaded.  She used to be on 40 mg of furosemide but that was discontinued when she was having diarrhea.  I am going to resume furosemide at the lower dose of 20 mg daily.  Echo 01/19/20  1. Left ventricular ejection fraction, by visual estimation, is 40 to  45%. The left ventricle has mild to moderately decreased function. There  is no left ventricular hypertrophy.   2. Global right ventricle has mildly reduced systolic function.The right  ventricular size is mildly enlarged. No increase in right ventricular wall  thickness.   3. Left  ventricular diastolic parameters are indeterminate.   4. The left ventricle demonstrates global hypokinesis.   5. Left atrial size was severely dilated.   6. The inferior vena cava is dilated in size with <50% respiratory  variability, suggesting right atrial pressure of 15 mmHg.   7. Rhythm is atrial fibrillation, rate 130 bpm.   EKG: No EKG ordered today. EKG independently reviewed from 09/30/2020 demonstrated rate controlled atrial fibrillation 67 bpm with sloping and lateral T waves consistent with digoxin use.   Recent Labs: 01/02/2020: Magnesium 2.1; TSH 1.810 11/20/2020: ALT 15 11/21/2020: BUN 20; Creatinine, Ser 0.93; Hemoglobin 13.1; Platelets 357; Potassium 3.7; Sodium 140  Recent Lipid Panel    Component Value Date/Time   CHOL 151 11/21/2020 0616   CHOL 196 03/20/2020 0830   TRIG 125 11/21/2020 0616   HDL 46 11/21/2020 0616   HDL 68 03/20/2020 0830   CHOLHDL 3.3 11/21/2020 0616   VLDL 25 11/21/2020 0616   LDLCALC 80 11/21/2020 0616   LDLCALC 103 (H) 03/20/2020 0830    Home Medications   Current Meds  Medication Sig  . Accu-Chek Softclix Lancets lancets Use as instructed to check blood sugars daily 2 hours after meal  . albuterol (VENTOLIN HFA) 108 (90 Base) MCG/ACT inhaler Inhale 2 puffs into the lungs every 4 (four) hours as needed for wheezing or shortness of breath.  . benzonatate (TESSALON) 100 MG capsule Take 1 capsule (100 mg total) by mouth 2 (two) times daily as needed for cough.  . budesonide (PULMICORT) 0.25 MG/2ML nebulizer solution Take 2 mLs (0.25 mg total) by nebulization 2 (two) times daily.  . cyanocobalamin (,VITAMIN B-12,) 1000 MCG/ML injection Inject 1,000 mcg into the muscle every 30 (thirty) days.  . digoxin (LANOXIN) 0.125 MG tablet Take 1 tablet (0.125 mg total) by mouth daily.  Marland Kitchen diltiazem (CARDIZEM CD) 360 MG 24 hr capsule Take 1 capsule (360 mg total) by mouth daily.  Marland Kitchen EPINEPHrine 0.3 mg/0.3 mL IJ SOAJ injection Inject 0.3 mg into the muscle  as needed for anaphylaxis.   . furosemide (LASIX) 20 MG tablet Take 20 mg by mouth daily.   Marland Kitchen glucose blood (ACCU-CHEK GUIDE) test strip Use as instructed to check blood sugars daily 2 hours after meal  . ipratropium-albuterol (DUONEB) 0.5-2.5 (3) MG/3ML SOLN Take 3 mLs by nebulization every 4 (four) hours as needed.  Marland Kitchen losartan (COZAAR) 25 MG tablet Take 1 tablet (25 mg total) by mouth daily.  . montelukast (SINGULAIR) 10 MG tablet Take 1 tablet (10 mg total) by mouth daily.  . ondansetron (ZOFRAN) 4 MG tablet Take 1 tablet (4 mg total) by mouth every 8 (eight) hours as needed for nausea or vomiting.  . Probiotic Product (PROBIOTIC-10) CAPS Take 1 capsule by  mouth daily.   Marland Kitchen spironolactone (ALDACTONE) 25 MG tablet Take 25 mg by mouth 2 (two) times daily.   . theophylline (THEODUR) 300 MG 12 hr tablet Take 1 tablet (300 mg total) by mouth 2 (two) times daily.  . traMADol (ULTRAM) 50 MG tablet Take 50 mg by mouth every 6 (six) hours as needed for moderate pain.   . [DISCONTINUED] ALPRAZolam (XANAX) 0.25 MG tablet Take 1 tablet (0.25 mg total) by mouth 2 (two) times daily.  . [DISCONTINUED] apixaban (ELIQUIS) 5 MG TABS tablet Take 1 tablet (5 mg total) by mouth 2 (two) times daily.  . [DISCONTINUED] omeprazole (PRILOSEC) 40 MG capsule Take 1 capsule (40 mg total) by mouth daily. (Patient not taking: Reported on 11/21/2020)  . [DISCONTINUED] predniSONE (DELTASONE) 10 MG tablet Take 1 tablet (10 mg total) by mouth daily with breakfast.    Review of Systems   All other systems reviewed and are otherwise negative except as noted above.  Physical Exam    VS:  BP 134/64 (BP Location: Left Arm, Patient Position: Sitting, Cuff Size: Large)   Pulse 66   Ht 5\' 4"  (1.626 m)   Wt 268 lb (121.6 kg)   LMP  (LMP Unknown)   SpO2 97%   BMI 46.00 kg/m  , BMI Body mass index is 46 kg/m. GEN: Well nourished, well developed, in no acute distress. HEENT: normal. Neck: Supple, no JVD, carotid bruits, or  masses. Cardiac: irregularly irregular, no murmurs, rubs, or gallops. No clubbing, cyanosis, edema.  Radials/DP/PT 2+ and equal bilaterally.  Respiratory:  Respirations regular and unlabored. No adventitious breath sounds.  GI: Soft, nontender, nondistended, BS + x 4. MS: No deformity or atrophy. Skin: Warm and dry, no rash.   Neuro:  Strength and sensation are intact. Psych: Normal affect.  Assessment & Plan    1. Coronary artery disease - Mild nonobstructive disease by cath 03/15/20.  Low-sodium, heart healthy diet encouraged.  Regular cardiovascular exercise encouraged.  GDMT includes statin.  No aspirin secondary to chronic anticoagulation.  No beta-blocker secondary to intolerance.   2. Chronic atrial fibrillation/chronic anticoagulation -rate controlled today.  Continue diltiazem 360 mg daily, digoxin 0.125 mg daily.  Denies bleeding complications, continue Eliquis 5 mg twice daily.  3. High risk medication use - Digoxin for atrial fibrillation. Digoxin level 09/27/20 0.9. No signs of toxicity.  4. OSA - CPAP use encouraged.  5. HLD, LDL goal <70 -04/2020 LDL 41. Continue Crestor 20mg  daily.  6. HTN - BP well controlled. Continue current antihypertensive regimen.   7. Chronic systolic heart failure - Euvolemic and well compensated on exam. LVEF 40-45% by echo 12/2019.  Right heart catheterization 02/2020 with pulmonary hypertension related to left-sided heart failure. GDMT includes loop diuretic, MRA, ARB. No beta blocker secondary to intolerance.  Of note she is on diltiazem though this will be continued as tachycardia is thought to be contributory to her reduced EF.  Continue low-sodium, heartily diet.  Continue regular cardiovascular exercise.  Disposition: Follow up in 3 months with Dr. Fletcher Anon or APP   Loel Dubonnet, NP 11/27/2020, 10:53 AM

## 2020-10-29 ENCOUNTER — Encounter: Payer: Self-pay | Admitting: Family

## 2020-10-29 ENCOUNTER — Ambulatory Visit (INDEPENDENT_AMBULATORY_CARE_PROVIDER_SITE_OTHER): Payer: Medicare Other | Admitting: Family

## 2020-10-29 ENCOUNTER — Other Ambulatory Visit: Payer: Self-pay

## 2020-10-29 VITALS — BP 134/64 | HR 66 | Ht 64.0 in | Wt 268.0 lb

## 2020-10-29 DIAGNOSIS — Z7901 Long term (current) use of anticoagulants: Secondary | ICD-10-CM | POA: Diagnosis not present

## 2020-10-29 DIAGNOSIS — I5022 Chronic systolic (congestive) heart failure: Secondary | ICD-10-CM

## 2020-10-29 DIAGNOSIS — E785 Hyperlipidemia, unspecified: Secondary | ICD-10-CM

## 2020-10-29 DIAGNOSIS — I10 Essential (primary) hypertension: Secondary | ICD-10-CM | POA: Diagnosis not present

## 2020-10-29 DIAGNOSIS — I25118 Atherosclerotic heart disease of native coronary artery with other forms of angina pectoris: Secondary | ICD-10-CM

## 2020-10-29 NOTE — Patient Instructions (Addendum)
Medication Instructions:  No medication changes today.   *If you need a refill on your cardiac medications before your next appointment, please call your pharmacy*  Lab Work: No lab work today.   Testing/Procedures: None ordered today.   Follow-Up: At [facility], you and your health needs are our priority.  As part of our continuing mission to provide you with exceptional heart care, we have created designated Provider Care Teams.  These Care Teams include your primary Cardiologist (physician) and Advanced Practice Providers (APPs -  Physician Assistants and Nurse Practitioners) who all work together to provide you with the care you need, when you need it.  We recommend signing up for the patient portal called "MyChart".  Sign up information is provided on this After Visit Summary.  MyChart is used to connect with patients for Virtual Visits (Telemedicine).  Patients are able to view lab/test results, encounter notes, upcoming appointments, etc.  Non-urgent messages can be sent to your provider as well.   To learn more about what you can do with MyChart, go to [email or web address].    Your next appointment:   3 month(s)  The format for your next appointment:   In Person  Provider:   You may see Kathlyn Sacramento, MD or one of the following Advanced Practice Providers on your designated Care Team:   Murray Hodgkins, NP  Laurann Montana, NP  Christell Faith, PA-C  Marrianne Mood, PA-C  Cadence Kathlen Mody, [place]   Other Instructions  Keep up the good work with your low salt diet!  Keep up the good work with keeping your fluid well controlled - your weight was down 4 pounds which is a great accomplishment!

## 2020-11-02 ENCOUNTER — Telehealth: Payer: Self-pay

## 2020-11-02 ENCOUNTER — Other Ambulatory Visit: Payer: Self-pay | Admitting: Hospice and Palliative Medicine

## 2020-11-02 DIAGNOSIS — F411 Generalized anxiety disorder: Secondary | ICD-10-CM

## 2020-11-02 MED ORDER — ALPRAZOLAM 0.25 MG PO TABS: 0.2500 mg | ORAL_TABLET | Freq: Two times a day (BID) | ORAL | 1 refills | Status: DC

## 2020-11-06 NOTE — Telephone Encounter (Signed)
alprazolam sent to pharmacy

## 2020-11-06 NOTE — Telephone Encounter (Signed)
done

## 2020-11-09 ENCOUNTER — Other Ambulatory Visit: Payer: Medicare Other

## 2020-11-20 ENCOUNTER — Inpatient Hospital Stay (HOSPITAL_COMMUNITY)
Admission: EM | Admit: 2020-11-20 | Discharge: 2020-11-23 | DRG: 023 | Disposition: A | Discharge status: Home Health | Payer: Medicare Other | Attending: Neurology | Admitting: Neurology

## 2020-11-20 ENCOUNTER — Emergency Department (HOSPITAL_COMMUNITY): Payer: Medicare Other

## 2020-11-20 ENCOUNTER — Inpatient Hospital Stay (HOSPITAL_COMMUNITY): Payer: Medicare Other | Admitting: Certified Registered Nurse Anesthetist

## 2020-11-20 ENCOUNTER — Inpatient Hospital Stay (HOSPITAL_COMMUNITY): Payer: Medicare Other

## 2020-11-20 ENCOUNTER — Other Ambulatory Visit (HOSPITAL_COMMUNITY): Payer: Medicare Other

## 2020-11-20 ENCOUNTER — Encounter (HOSPITAL_COMMUNITY)
Admission: EM | Disposition: A | Discharge status: Home Health | Payer: Self-pay | Source: Home / Self Care | Attending: Neurology

## 2020-11-20 DIAGNOSIS — I110 Hypertensive heart disease with heart failure: Secondary | ICD-10-CM | POA: Diagnosis present

## 2020-11-20 DIAGNOSIS — R29810 Facial weakness: Secondary | ICD-10-CM | POA: Diagnosis present

## 2020-11-20 DIAGNOSIS — Z7952 Long term (current) use of systemic steroids: Secondary | ICD-10-CM

## 2020-11-20 DIAGNOSIS — R1312 Dysphagia, oropharyngeal phase: Secondary | ICD-10-CM | POA: Diagnosis present

## 2020-11-20 DIAGNOSIS — Z9049 Acquired absence of other specified parts of digestive tract: Secondary | ICD-10-CM

## 2020-11-20 DIAGNOSIS — I10 Essential (primary) hypertension: Secondary | ICD-10-CM | POA: Diagnosis present

## 2020-11-20 DIAGNOSIS — I482 Chronic atrial fibrillation, unspecified: Secondary | ICD-10-CM | POA: Diagnosis not present

## 2020-11-20 DIAGNOSIS — Z20822 Contact with and (suspected) exposure to covid-19: Secondary | ICD-10-CM | POA: Diagnosis present

## 2020-11-20 DIAGNOSIS — G4733 Obstructive sleep apnea (adult) (pediatric): Secondary | ICD-10-CM | POA: Diagnosis present

## 2020-11-20 DIAGNOSIS — J42 Unspecified chronic bronchitis: Secondary | ICD-10-CM | POA: Diagnosis present

## 2020-11-20 DIAGNOSIS — I2510 Atherosclerotic heart disease of native coronary artery without angina pectoris: Secondary | ICD-10-CM | POA: Diagnosis present

## 2020-11-20 DIAGNOSIS — R29714 NIHSS score 14: Secondary | ICD-10-CM | POA: Diagnosis present

## 2020-11-20 DIAGNOSIS — I255 Ischemic cardiomyopathy: Secondary | ICD-10-CM | POA: Diagnosis present

## 2020-11-20 DIAGNOSIS — Z888 Allergy status to other drugs, medicaments and biological substances status: Secondary | ICD-10-CM

## 2020-11-20 DIAGNOSIS — I272 Pulmonary hypertension, unspecified: Secondary | ICD-10-CM | POA: Diagnosis present

## 2020-11-20 DIAGNOSIS — I63511 Cerebral infarction due to unspecified occlusion or stenosis of right middle cerebral artery: Secondary | ICD-10-CM | POA: Diagnosis not present

## 2020-11-20 DIAGNOSIS — I63411 Cerebral infarction due to embolism of right middle cerebral artery: Principal | ICD-10-CM | POA: Diagnosis present

## 2020-11-20 DIAGNOSIS — Z8262 Family history of osteoporosis: Secondary | ICD-10-CM

## 2020-11-20 DIAGNOSIS — Z825 Family history of asthma and other chronic lower respiratory diseases: Secondary | ICD-10-CM

## 2020-11-20 DIAGNOSIS — Z22322 Carrier or suspected carrier of Methicillin resistant Staphylococcus aureus: Secondary | ICD-10-CM

## 2020-11-20 DIAGNOSIS — G8194 Hemiplegia, unspecified affecting left nondominant side: Secondary | ICD-10-CM | POA: Diagnosis present

## 2020-11-20 DIAGNOSIS — Z7901 Long term (current) use of anticoagulants: Secondary | ICD-10-CM

## 2020-11-20 DIAGNOSIS — I6529 Occlusion and stenosis of unspecified carotid artery: Secondary | ICD-10-CM | POA: Diagnosis present

## 2020-11-20 DIAGNOSIS — Z86718 Personal history of other venous thrombosis and embolism: Secondary | ICD-10-CM

## 2020-11-20 DIAGNOSIS — R4781 Slurred speech: Secondary | ICD-10-CM | POA: Diagnosis present

## 2020-11-20 DIAGNOSIS — Z6841 Body Mass Index (BMI) 40.0 and over, adult: Secondary | ICD-10-CM | POA: Diagnosis not present

## 2020-11-20 DIAGNOSIS — I5022 Chronic systolic (congestive) heart failure: Secondary | ICD-10-CM | POA: Diagnosis present

## 2020-11-20 DIAGNOSIS — I6389 Other cerebral infarction: Secondary | ICD-10-CM | POA: Diagnosis not present

## 2020-11-20 DIAGNOSIS — I428 Other cardiomyopathies: Secondary | ICD-10-CM | POA: Diagnosis present

## 2020-11-20 DIAGNOSIS — E785 Hyperlipidemia, unspecified: Secondary | ICD-10-CM | POA: Diagnosis present

## 2020-11-20 DIAGNOSIS — E669 Obesity, unspecified: Secondary | ICD-10-CM | POA: Diagnosis present

## 2020-11-20 DIAGNOSIS — I6601 Occlusion and stenosis of right middle cerebral artery: Secondary | ICD-10-CM | POA: Diagnosis not present

## 2020-11-20 DIAGNOSIS — G460 Middle cerebral artery syndrome: Secondary | ICD-10-CM | POA: Diagnosis present

## 2020-11-20 DIAGNOSIS — I609 Nontraumatic subarachnoid hemorrhage, unspecified: Secondary | ICD-10-CM | POA: Diagnosis not present

## 2020-11-20 DIAGNOSIS — F411 Generalized anxiety disorder: Secondary | ICD-10-CM | POA: Diagnosis present

## 2020-11-20 DIAGNOSIS — Z9989 Dependence on other enabling machines and devices: Secondary | ICD-10-CM

## 2020-11-20 DIAGNOSIS — I739 Peripheral vascular disease, unspecified: Secondary | ICD-10-CM | POA: Diagnosis present

## 2020-11-20 DIAGNOSIS — J449 Chronic obstructive pulmonary disease, unspecified: Secondary | ICD-10-CM | POA: Diagnosis present

## 2020-11-20 DIAGNOSIS — K219 Gastro-esophageal reflux disease without esophagitis: Secondary | ICD-10-CM | POA: Diagnosis present

## 2020-11-20 DIAGNOSIS — I639 Cerebral infarction, unspecified: Secondary | ICD-10-CM | POA: Diagnosis not present

## 2020-11-20 DIAGNOSIS — Z79899 Other long term (current) drug therapy: Secondary | ICD-10-CM

## 2020-11-20 DIAGNOSIS — I63512 Cerebral infarction due to unspecified occlusion or stenosis of left middle cerebral artery: Secondary | ICD-10-CM

## 2020-11-20 DIAGNOSIS — Z8673 Personal history of transient ischemic attack (TIA), and cerebral infarction without residual deficits: Secondary | ICD-10-CM

## 2020-11-20 HISTORY — DX: Carrier or suspected carrier of methicillin resistant Staphylococcus aureus: Z22.322

## 2020-11-20 HISTORY — PX: IR PERCUTANEOUS ART THROMBECTOMY/INFUSION INTRACRANIAL INC DIAG ANGIO: IMG6087

## 2020-11-20 HISTORY — PX: RADIOLOGY WITH ANESTHESIA: SHX6223

## 2020-11-20 HISTORY — PX: IR ANGIO VERTEBRAL SEL SUBCLAVIAN INNOMINATE UNI R MOD SED: IMG5365

## 2020-11-20 HISTORY — DX: Cerebral infarction due to unspecified occlusion or stenosis of left middle cerebral artery: I63.512

## 2020-11-20 HISTORY — PX: IR CT HEAD LTD: IMG2386

## 2020-11-20 LAB — I-STAT CHEM 8, ED
BUN: 19 mg/dL (ref 8–23)
Calcium, Ion: 1.02 mmol/L — ABNORMAL LOW (ref 1.15–1.40)
Chloride: 107 mmol/L (ref 98–111)
Creatinine, Ser: 0.9 mg/dL (ref 0.44–1.00)
Glucose, Bld: 148 mg/dL — ABNORMAL HIGH (ref 70–99)
HCT: 45 % (ref 36.0–46.0)
Hemoglobin: 15.3 g/dL — ABNORMAL HIGH (ref 12.0–15.0)
Potassium: 3.7 mmol/L (ref 3.5–5.1)
Sodium: 138 mmol/L (ref 135–145)
TCO2: 21 mmol/L — ABNORMAL LOW (ref 22–32)

## 2020-11-20 LAB — COMPREHENSIVE METABOLIC PANEL
ALT: 15 U/L (ref 0–44)
AST: 15 U/L (ref 15–41)
Albumin: 3.5 g/dL (ref 3.5–5.0)
Alkaline Phosphatase: 61 U/L (ref 38–126)
Anion gap: 12 (ref 5–15)
BUN: 16 mg/dL (ref 8–23)
CO2: 21 mmol/L — ABNORMAL LOW (ref 22–32)
Calcium: 8.9 mg/dL (ref 8.9–10.3)
Chloride: 103 mmol/L (ref 98–111)
Creatinine, Ser: 1.06 mg/dL — ABNORMAL HIGH (ref 0.44–1.00)
GFR, Estimated: 57 mL/min — ABNORMAL LOW (ref >60)
Glucose, Bld: 153 mg/dL — ABNORMAL HIGH (ref 70–99)
Potassium: 4 mmol/L (ref 3.5–5.1)
Sodium: 136 mmol/L (ref 135–145)
Total Bilirubin: 0.8 mg/dL (ref 0.3–1.2)
Total Protein: 6.8 g/dL (ref 6.5–8.1)

## 2020-11-20 LAB — DIFFERENTIAL
Abs Immature Granulocytes: 0.13 10*3/uL — ABNORMAL HIGH (ref 0.00–0.07)
Basophils Absolute: 0.1 10*3/uL (ref 0.0–0.1)
Basophils Relative: 0 %
Eosinophils Absolute: 0.2 10*3/uL (ref 0.0–0.5)
Eosinophils Relative: 2 %
Immature Granulocytes: 1 %
Lymphocytes Relative: 30 %
Lymphs Abs: 3.4 10*3/uL (ref 0.7–4.0)
Monocytes Absolute: 0.7 10*3/uL (ref 0.1–1.0)
Monocytes Relative: 6 %
Neutro Abs: 7 10*3/uL (ref 1.7–7.7)
Neutrophils Relative %: 61 %

## 2020-11-20 LAB — CBC
HCT: 49 % — ABNORMAL HIGH (ref 36.0–46.0)
Hemoglobin: 15 g/dL (ref 12.0–15.0)
MCH: 29.6 pg (ref 26.0–34.0)
MCHC: 30.6 g/dL (ref 30.0–36.0)
MCV: 96.8 fL (ref 80.0–100.0)
Platelets: 395 10*3/uL (ref 150–400)
RBC: 5.06 MIL/uL (ref 3.87–5.11)
RDW: 14.1 % (ref 11.5–15.5)
WBC: 11.5 10*3/uL — ABNORMAL HIGH (ref 4.0–10.5)
nRBC: 0 % (ref 0.0–0.2)

## 2020-11-20 LAB — CBG MONITORING, ED: Glucose-Capillary: 152 mg/dL — ABNORMAL HIGH (ref 70–99)

## 2020-11-20 LAB — RESP PANEL BY RT-PCR (FLU A&B, COVID) ARPGX2
Influenza A by PCR: NEGATIVE
Influenza B by PCR: NEGATIVE
SARS Coronavirus 2 by RT PCR: NEGATIVE

## 2020-11-20 LAB — PROTIME-INR
INR: 1.2 (ref 0.8–1.2)
Prothrombin Time: 14.8 seconds (ref 11.4–15.2)

## 2020-11-20 LAB — MRSA PCR SCREENING: MRSA by PCR: POSITIVE — AB

## 2020-11-20 LAB — APTT: aPTT: 23 seconds — ABNORMAL LOW (ref 24–36)

## 2020-11-20 SURGERY — IR WITH ANESTHESIA
Anesthesia: General

## 2020-11-20 MED ORDER — ACETAMINOPHEN 325 MG PO TABS: 650.0000 mg | ORAL_TABLET | ORAL | Status: DC | PRN

## 2020-11-20 MED ORDER — MUPIROCIN 2 % EX OINT
1.0000 "application " | TOPICAL_OINTMENT | Freq: Two times a day (BID) | CUTANEOUS | Status: DC
  Administered 2020-11-21 – 2020-11-23 (×6): 1 via NASAL
  Filled 2020-11-20 (×2): qty 22

## 2020-11-20 MED ORDER — FENTANYL CITRATE (PF) 250 MCG/5ML IJ SOLN
INTRAMUSCULAR | Status: DC | PRN
  Administered 2020-11-20 (×2): 50 ug via INTRAVENOUS

## 2020-11-20 MED ORDER — CEFAZOLIN SODIUM-DEXTROSE 2-3 GM-%(50ML) IV SOLR
INTRAVENOUS | Status: DC | PRN
  Administered 2020-11-20: 2 g via INTRAVENOUS

## 2020-11-20 MED ORDER — DILTIAZEM HCL-DEXTROSE 125-5 MG/125ML-% IV SOLN (PREMIX)
5.0000 mg/h | INTRAVENOUS | Status: AC
  Administered 2020-11-20: 5 mg/h via INTRAVENOUS
  Filled 2020-11-20 (×2): qty 125

## 2020-11-20 MED ORDER — CLOPIDOGREL BISULFATE 300 MG PO TABS
ORAL_TABLET | ORAL | Status: AC
  Filled 2020-11-20: qty 1

## 2020-11-20 MED ORDER — ACETAMINOPHEN 160 MG/5ML PO SOLN: 650.0000 mg | ORAL | Status: DC | PRN

## 2020-11-20 MED ORDER — CLEVIDIPINE BUTYRATE 0.5 MG/ML IV EMUL
INTRAVENOUS | Status: AC
  Filled 2020-11-20: qty 50

## 2020-11-20 MED ORDER — SODIUM CHLORIDE 0.9% FLUSH: 3.0000 mL | Freq: Once | INTRAVENOUS | Status: DC

## 2020-11-20 MED ORDER — SENNOSIDES-DOCUSATE SODIUM 8.6-50 MG PO TABS: 1.0000 | ORAL_TABLET | Freq: Every evening | ORAL | Status: DC | PRN

## 2020-11-20 MED ORDER — TICAGRELOR 90 MG PO TABS
ORAL_TABLET | ORAL | Status: AC
  Filled 2020-11-20: qty 2

## 2020-11-20 MED ORDER — HYDROMORPHONE BOLUS VIA INFUSION
1.0000 mg | INTRAVENOUS | Status: DC | PRN
  Filled 2020-11-20: qty 1

## 2020-11-20 MED ORDER — SODIUM CHLORIDE 0.9 % IV SOLN: INTRAVENOUS | Status: DC | PRN

## 2020-11-20 MED ORDER — INFLUENZA VAC A&B SA ADJ QUAD 0.5 ML IM PRSY
0.5000 mL | PREFILLED_SYRINGE | INTRAMUSCULAR | Status: DC
  Filled 2020-11-20: qty 0.5

## 2020-11-20 MED ORDER — STROKE: EARLY STAGES OF RECOVERY BOOK
Freq: Once | Status: DC
  Filled 2020-11-20: qty 1

## 2020-11-20 MED ORDER — EPTIFIBATIDE 20 MG/10ML IV SOLN
INTRAVENOUS | Status: AC
  Filled 2020-11-20: qty 10

## 2020-11-20 MED ORDER — CHLORHEXIDINE GLUCONATE CLOTH 2 % EX PADS
6.0000 | MEDICATED_PAD | Freq: Every day | CUTANEOUS | Status: DC
  Administered 2020-11-22 – 2020-11-23 (×2): 6 via TOPICAL

## 2020-11-20 MED ORDER — NITROGLYCERIN 1 MG/10 ML FOR IR/CATH LAB
INTRA_ARTERIAL | Status: AC
  Filled 2020-11-20: qty 10

## 2020-11-20 MED ORDER — ONDANSETRON HCL 4 MG/2ML IJ SOLN
INTRAMUSCULAR | Status: DC | PRN
  Administered 2020-11-20: 4 mg via INTRAVENOUS

## 2020-11-20 MED ORDER — HYDROMORPHONE HCL 1 MG/ML IJ SOLN
1.0000 mg | INTRAMUSCULAR | Status: DC | PRN
  Administered 2020-11-20 – 2020-11-22 (×4): 1 mg via INTRAVENOUS
  Filled 2020-11-20 (×4): qty 1

## 2020-11-20 MED ORDER — ACETAMINOPHEN 650 MG RE SUPP: 650.0000 mg | RECTAL | Status: DC | PRN

## 2020-11-20 MED ORDER — CANGRELOR TETRASODIUM 50 MG IV SOLR
INTRAVENOUS | Status: AC
  Filled 2020-11-20: qty 50

## 2020-11-20 MED ORDER — SODIUM CHLORIDE 0.9 % IV SOLN: INTRAVENOUS | Status: DC

## 2020-11-20 MED ORDER — KETOROLAC TROMETHAMINE 15 MG/ML IJ SOLN
15.0000 mg | Freq: Four times a day (QID) | INTRAMUSCULAR | Status: DC | PRN
  Administered 2020-11-20 – 2020-11-21 (×3): 15 mg via INTRAVENOUS
  Filled 2020-11-20 (×3): qty 1

## 2020-11-20 MED ORDER — SODIUM CHLORIDE (PF) 0.9 % IJ SOLN
INTRAVENOUS | Status: AC | PRN
  Administered 2020-11-20 (×2): 25 ug via INTRA_ARTERIAL

## 2020-11-20 MED ORDER — CLEVIDIPINE BUTYRATE 0.5 MG/ML IV EMUL
INTRAVENOUS | Status: DC | PRN
  Administered 2020-11-20: 2 mg/h via INTRAVENOUS

## 2020-11-20 MED ORDER — TIROFIBAN HCL IN NACL 5-0.9 MG/100ML-% IV SOLN
INTRAVENOUS | Status: AC
  Filled 2020-11-20: qty 100

## 2020-11-20 MED ORDER — MELATONIN 3 MG PO TABS
3.0000 mg | ORAL_TABLET | Freq: Every evening | ORAL | Status: DC | PRN
  Administered 2020-11-22: 3 mg via ORAL
  Filled 2020-11-20 (×2): qty 1

## 2020-11-20 MED ORDER — IOHEXOL 240 MG/ML SOLN
INTRAMUSCULAR | Status: AC
  Filled 2020-11-20: qty 200

## 2020-11-20 MED ORDER — SUCCINYLCHOLINE CHLORIDE 20 MG/ML IJ SOLN
INTRAMUSCULAR | Status: DC | PRN
  Administered 2020-11-20: 140 mg via INTRAVENOUS

## 2020-11-20 MED ORDER — PROPOFOL 10 MG/ML IV BOLUS
INTRAVENOUS | Status: DC | PRN
  Administered 2020-11-20: 140 mg via INTRAVENOUS

## 2020-11-20 MED ORDER — SUGAMMADEX SODIUM 200 MG/2ML IV SOLN
INTRAVENOUS | Status: DC | PRN
  Administered 2020-11-20: 400 mg via INTRAVENOUS

## 2020-11-20 MED ORDER — ASPIRIN 81 MG PO CHEW
CHEWABLE_TABLET | ORAL | Status: AC
  Filled 2020-11-20: qty 1

## 2020-11-20 MED ORDER — ROCURONIUM BROMIDE 10 MG/ML (PF) SYRINGE
PREFILLED_SYRINGE | INTRAVENOUS | Status: DC | PRN
  Administered 2020-11-20: 50 mg via INTRAVENOUS
  Administered 2020-11-20: 20 mg via INTRAVENOUS

## 2020-11-20 MED ORDER — FENTANYL CITRATE (PF) 100 MCG/2ML IJ SOLN
INTRAMUSCULAR | Status: AC
  Filled 2020-11-20: qty 2

## 2020-11-20 MED ORDER — IOHEXOL 300 MG/ML  SOLN: 150.0000 mL | Freq: Once | INTRAMUSCULAR | Status: DC | PRN

## 2020-11-20 MED ORDER — LACTATED RINGERS IV SOLN: INTRAVENOUS | Status: DC | PRN

## 2020-11-20 MED ORDER — ESMOLOL HCL 100 MG/10ML IV SOLN
INTRAVENOUS | Status: DC | PRN
  Administered 2020-11-20 (×4): 20 mg via INTRAVENOUS

## 2020-11-20 MED ORDER — IOHEXOL 350 MG/ML SOLN
75.0000 mL | Freq: Once | INTRAVENOUS | Status: AC | PRN
  Administered 2020-11-20: 75 mL via INTRAVENOUS

## 2020-11-20 MED ORDER — IOHEXOL 300 MG/ML  SOLN: 50.0000 mL | Freq: Once | INTRAMUSCULAR | Status: DC | PRN

## 2020-11-20 MED ORDER — ONDANSETRON HCL 4 MG/2ML IJ SOLN: 4.0000 mg | Freq: Once | INTRAMUSCULAR | Status: DC

## 2020-11-20 MED ORDER — VERAPAMIL HCL 2.5 MG/ML IV SOLN
INTRAVENOUS | Status: AC
  Filled 2020-11-20: qty 2

## 2020-11-20 MED ORDER — CEFAZOLIN SODIUM-DEXTROSE 2-4 GM/100ML-% IV SOLN
INTRAVENOUS | Status: AC
  Filled 2020-11-20: qty 100

## 2020-11-20 MED ORDER — CLEVIDIPINE BUTYRATE 0.5 MG/ML IV EMUL
0.0000 mg/h | INTRAVENOUS | Status: AC
  Administered 2020-11-20 – 2020-11-21 (×2): 6 mg/h via INTRAVENOUS
  Administered 2020-11-21: 12 mg/h via INTRAVENOUS
  Administered 2020-11-21: 10 mg/h via INTRAVENOUS
  Administered 2020-11-21: 4 mg/h via INTRAVENOUS
  Filled 2020-11-20 (×4): qty 50

## 2020-11-20 MED ORDER — ACETAMINOPHEN 325 MG PO TABS
650.0000 mg | ORAL_TABLET | ORAL | Status: DC | PRN
  Administered 2020-11-22: 650 mg via ORAL
  Filled 2020-11-20 (×2): qty 2

## 2020-11-20 NOTE — Progress Notes (Signed)
Patient ID: Laura [name], female   DOB: 27-Dec-1950, 69 y.o.   MRN: [id number] INR. [place] [facility] 1. LSW 10 30 am  New onset  Lt sided weakness and RT gaze deviation with slurred speech. CT brain No ICH ASPECTS 10 CTA occluded RT MCA M 1 seg Endovascular revascularization D/W spouse by me and Dr Rory Percy. Procedure,reasons and alternatives reviewed. Risks of ICH of 10 %,worsening neuro deficit,death ,inability to revascularize vessel  trauma, renal failure but not limited to these were discussed . Spouse expressed  understanding and provided consent to proceed. S.Shantavia Jha MD

## 2020-11-20 NOTE — Transfer of Care (Signed)
Immediate Anesthesia Transfer of Care Note  Patient: Laura [name]  Procedure(s) Performed: IR WITH ANESTHESIA - CODE STROKE (N/A )  Patient Location: PACU  Anesthesia Type:General  Level of Consciousness: awake, alert , oriented, patient cooperative and responds to stimulation  Airway & Oxygen Therapy: Patient Spontanous Breathing and Patient connected to face mask oxygen  Post-op Assessment: Report given to RN and Post -op Vital signs reviewed and stable  Post vital signs: Reviewed and stable  Last Vitals:  Vitals Value Taken Time  BP 155/125 11/20/20 1355  Temp    Pulse 36 11/20/20 1359  Resp 18 11/20/20 1359  SpO2 82 % 11/20/20 1359  Vitals shown include unvalidated device data.  Last Pain: There were no vitals filed for this visit.       Complications: No complications documented.

## 2020-11-20 NOTE — Anesthesia Preprocedure Evaluation (Addendum)
Anesthesia Evaluation  Patient identified by MRN, date of birth, ID band Patient confused    Reviewed: Patient's Chart, lab work & pertinent test results, Unable to perform ROS - Chart review onlyPreop documentation limited or incomplete due to emergent nature of procedure.  Airway Mallampati: III  TM Distance: >3 FB     Dental   Pulmonary     + decreased breath sounds      Cardiovascular hypertension,  Rhythm:Irregular Rate:Tachycardia     Neuro/Psych    GI/Hepatic   Endo/Other    Renal/GU      Musculoskeletal   Abdominal (+) + obese,   Peds  Hematology   Anesthesia Other Findings Patient following simple commands. Unable to move left arm and left leg  Reproductive/Obstetrics                             Anesthesia Physical Anesthesia Plan  ASA: IV and emergent  Anesthesia Plan: General   Post-op Pain Management:    Induction: Intravenous  PONV Risk Score and Plan: Ondansetron and Dexamethasone  Airway Management Planned: Oral ETT  Additional Equipment: Arterial line  Intra-op Plan:   Post-operative Plan: Possible Post-op intubation/ventilation  Informed Consent: I have reviewed the patients History and Physical, chart, labs and discussed the procedure including the risks, benefits and alternatives for the proposed anesthesia with the patient or authorized representative who has indicated his/her understanding and acceptance.       Plan Discussed with: Anesthesiologist and CRNA  Anesthesia Plan Comments: (Patient seen in IR for emergent code stroke. Transferred from [facility].  Chronic persistent AFib Htn Obesity Nonischemic cardiomyopathy EF 40-45% COPD/Asthma  Plan GA with RSI, art line probable post-op ventilation  )       Anesthesia Quick Evaluation

## 2020-11-20 NOTE — OR Nursing (Signed)
Pt is awake,alert and oriented see NIH scale.Pt and/or family verbalized understanding of poc and discharge instructions. Reviewed admission and on going care with receiving RN. Pt is in NAD at this time and is ready to be transferred to floor. Will con't to monitor until pt is transferred. Belongings on bed with patient Pt on 02 at 4l.min via simple mask Pt on monitor to ICU with RN and [employer]

## 2020-11-20 NOTE — Progress Notes (Signed)
Set patient up on auto Cpap with medium full face mask.  Patient states wears oxygen at home with CPAP.  Patient has 3 liters of oxygen bled in line through CPAP tubing.  Patient tolerating well.

## 2020-11-20 NOTE — Anesthesia Procedure Notes (Signed)
Arterial Line Insertion Start/End11/30/2021 12:30 PM, 11/20/2020 12:32 PM Performed by: Glynda Jaeger, CRNA, CRNA  Preanesthetic checklist: patient identified, IV checked, site marked, risks and benefits discussed, surgical consent, monitors and equipment checked, pre-op evaluation, timeout performed and anesthesia consent Left, radial was placed Catheter size: 20 G Hand hygiene performed  and maximum sterile barriers used  Allen's test indicative of satisfactory collateral circulation Attempts: 1 Procedure performed without using ultrasound guided technique. Following insertion, dressing applied and Biopatch. Patient tolerated the procedure well with no immediate complications.

## 2020-11-20 NOTE — Procedures (Signed)
S/P bilateral common carotid arteriograms followed by revascularization  of Rt MCA occlusion  With  1 pass with 6.5 mm x 45 mm embotrap  With aspiration achieving a TICI 3 revascularization. Post CT brain small amount of SAH in the post perisylvian fissure and contrast stain in the RT parietal sub cortical region. No mass effect..44F angioseal used for hemostasis.  Distal pulses all dopplerable. Obeys simple comands appropriately. Pupils 43mm Rt = LT . Able to bend Lt knee and lift Lt arm against gravity. S.Dakotah Orrego MD

## 2020-11-20 NOTE — ED Triage Notes (Signed)
BIB EMS for fall/code stroke. Per report, husband heard the patient fell and found her on the floor. Not sure if pt hit floor but pt is on eliquis. Initially pt's symptoms were left side weakness,, left side neglect and slurred speech.

## 2020-11-20 NOTE — H&P (Signed)
History and physical-stroke admission  CC: Sudden onset left-sided weakness, rightward gaze  History is obtained from: EMS, patient's husband Mr. Baine over the phone  HPI: Laura [name] is a 69 y.o. female past medical history of chronic atrial fibrillation on Eliquis last dose last night, COPD, gastroesophageal reflux disease, heart failure with midrange ejection fraction, hypertension, hyperlipidemia, nonischemic cardiomyopathy, obstructive sleep apnea, obesity, presenting to the emergency room from home for sudden onset of left-sided weakness. She woke up normally this morning and somewhere around 10:30 AM was noted by the husband to be on the floor, where she slouched from the couch on which she was sitting.  She reported she fell and could not get up.  He noticed left-sided weakness.  She also had a gaze preference to the right. She was also slurring her words and and left-sided facial droop.  EMS evaluated her and activated a code stroke.  Her systolic blood pressure was 180s 190s. Her blood sugars were within normal range. She was brought into the emergency room, last dose of Eliquis time confirmed that precluded her from getting TPA. Noncontrast head CT showed a dense right MCA with an aspects of 10. CTA confirmed the right M1 emergent occlusion. Discussed with husband the possibility for endovascular thrombectomy and he agreed and consented.  Husband also provided history that she has bilateral lower extremity DVTs.  LKW: 10:30 AM on 11/20/2020 tpa given?: no, on Eliquis with last dose Premorbid modified Rankin scale (mRS): 1  ROS: Performed and negative except as noted in HPI.  Past Medical History:  Diagnosis Date  . Asthma   . Chronic atrial fibrillation ([place])    a. diagnosed in 09/2016; b. failed flecainide and propafenone due to LE swelling and SOB, could not afford Multaq; c. CHADS2VASc => 5 (CHF, HTN, age x 1, nonobs CAD, female)--> Eliquis  . COPD (chronic  obstructive pulmonary disease) ([place])   . GERD (gastroesophageal reflux disease)   . HFmrEF (heart failure with mid-range ejection fraction) ([place])    a. 12/2019 Echo: EF 40-45%.  . Hyperlipidemia   . Hypertension   . NICM (nonischemic cardiomyopathy) ([place])    a. 12/2019 Echo: EF 40-45%, glob HK, mildly reduced RV fxn, sev dil LA. *HR 130 (afib) during study.  . Nonobstructive CAD (coronary artery disease)    a. Lexiscan Myoview 10/2016: no evidence of ischemia, EF 53%; b. 02/2020 Cath: LM nl, LAD 20p, 5m, LCX 20ost, OM1/2/3 nl, LPDA nl, LPL1/2 nl, LPAV nl, RCA small, nl.  . Obesity   . Obstructive sleep apnea   . Pulmonary hypertension ([place])   . Systolic dysfunction    a. TTE 10/2016: EF 50%, mild LVH, moderately dilated LA, moderate MR/TR, mild pulmonary hypertension    Family History  Problem Relation Age of Onset  . Dementia Mother   . Osteoporosis Mother   . Vascular Disease Mother   . COPD Father    Social History:   reports that she has never smoked. She has never used smokeless tobacco. She reports that she does not drink alcohol and does not use drugs.  Medications  Current Facility-Administered Medications:  .   stroke: mapping our early stages of recovery book, , Does not apply, Once, Amie Portland, MD .  0.9 %  sodium chloride infusion, , Intravenous, Continuous, Amie Portland, MD .  acetaminophen (TYLENOL) tablet 650 mg, 650 mg, Oral, Q4H PRN **OR** acetaminophen (TYLENOL) 160 MG/5ML solution 650 mg, 650 mg, Per Tube, Q4H PRN **OR** acetaminophen (TYLENOL) suppository  650 mg, 650 mg, Rectal, Q4H PRN, Amie Portland, MD .  aspirin 81 MG chewable tablet, , , ,  .  cangrelor (KENGREAL) 50 MG SOLR, , , ,  .  ceFAZolin (ANCEF) 2-4 GM/100ML-% IVPB, , , ,  .  clopidogrel (PLAVIX) 300 MG tablet, , , ,  .  cyanocobalamin ((VITAMIN B-12)) injection 1,000 mcg, 1,000 mcg, Intramuscular, Once, Boscia, Heather E, NP .  eptifibatide (INTEGRILIN) 20 MG/10ML injection, , , ,  .   fentaNYL (SUBLIMAZE) 100 MCG/2ML injection, , , ,  .  iohexol (OMNIPAQUE) 240 MG/ML injection, , , ,  .  iohexol (OMNIPAQUE) 300 MG/ML solution 150 mL, 150 mL, Intra-arterial, Once PRN, Deveshwar, Sanjeev, MD .  iohexol (OMNIPAQUE) 300 MG/ML solution 50 mL, 50 mL, Intra-arterial, Once PRN, Deveshwar, Sanjeev, MD .  nitroGLYCERIN 100 mcg/mL intra-arterial injection, , , ,  .  senna-docusate (Senokot-S) tablet 1 tablet, 1 tablet, Oral, QHS PRN, Amie Portland, MD .  sodium chloride flush (NS) 0.9 % injection 3 mL, 3 mL, Intravenous, Once, Hayden Rasmussen, MD .  ticagrelor (BRILINTA) 90 MG tablet, , , ,  .  tirofiban (AGGRASTAT) 5-0.9 MG/100ML-% injection, , , ,  .  verapamil (ISOPTIN) 2.5 MG/ML injection, , , ,   Current Outpatient Medications:  .  Accu-Chek Softclix Lancets lancets, Use as instructed to check blood sugars daily 2 hours after meal, Disp: 100 each, Rfl: 12 .  albuterol (VENTOLIN HFA) 108 (90 Base) MCG/ACT inhaler, Inhale 2 puffs into the lungs every 4 (four) hours as needed for wheezing or shortness of breath., Disp: 18 g, Rfl: 3 .  ALPRAZolam (XANAX) 0.25 MG tablet, Take 1 tablet (0.25 mg total) by mouth 2 (two) times daily., Disp: 45 tablet, Rfl: 1 .  apixaban (ELIQUIS) 5 MG TABS tablet, Take 1 tablet (5 mg total) by mouth 2 (two) times daily., Disp: 60 tablet, Rfl: 6 .  benzonatate (TESSALON) 100 MG capsule, Take 1 capsule (100 mg total) by mouth 2 (two) times daily as needed for cough., Disp: 20 capsule, Rfl: 0 .  budesonide (PULMICORT) 0.25 MG/2ML nebulizer solution, Take 2 mLs (0.25 mg total) by nebulization 2 (two) times daily., Disp: 60 mL, Rfl: 12 .  cyanocobalamin (,VITAMIN B-12,) 1000 MCG/ML injection, Inject 1,000 mcg into the muscle every 30 (thirty) days., Disp: , Rfl:  .  digoxin (LANOXIN) 0.125 MG tablet, Take 1 tablet (0.125 mg total) by mouth daily., Disp: 90 tablet, Rfl: 1 .  diltiazem (CARDIZEM CD) 360 MG 24 hr capsule, Take 1 capsule (360 mg total) by mouth  daily., Disp: 90 capsule, Rfl: 1 .  EPINEPHrine 0.3 mg/0.3 mL IJ SOAJ injection, Inject 0.3 mg into the muscle as needed for anaphylaxis. , Disp: , Rfl:  .  furosemide (LASIX) 20 MG tablet, Take 20 mg by mouth 2 (two) times daily., Disp: , Rfl:  .  glucose blood (ACCU-CHEK GUIDE) test strip, Use as instructed to check blood sugars daily 2 hours after meal, Disp: 100 each, Rfl: 12 .  ipratropium-albuterol (DUONEB) 0.5-2.5 (3) MG/3ML SOLN, Take 3 mLs by nebulization every 4 (four) hours as needed., Disp: 120 mL, Rfl: 3 .  losartan (COZAAR) 25 MG tablet, Take 1 tablet (25 mg total) by mouth daily., Disp: 90 tablet, Rfl: 1 .  montelukast (SINGULAIR) 10 MG tablet, Take 1 tablet (10 mg total) by mouth daily., Disp: 90 tablet, Rfl: 1 .  omeprazole (PRILOSEC) 40 MG capsule, Take 1 capsule (40 mg total) by mouth daily., Disp: 90  capsule, Rfl: 0 .  ondansetron (ZOFRAN) 4 MG tablet, Take 1 tablet (4 mg total) by mouth every 8 (eight) hours as needed for nausea or vomiting., Disp: 60 tablet, Rfl: 1 .  predniSONE (DELTASONE) 10 MG tablet, Take 1 tablet (10 mg total) by mouth daily with breakfast., Disp: 90 tablet, Rfl: 0 .  Probiotic Product (PROBIOTIC-10) CAPS, Take 1 capsule by mouth daily. , Disp: , Rfl:  .  rosuvastatin (CRESTOR) 20 MG tablet, Take 1 tablet (20 mg total) by mouth daily., Disp: 90 tablet, Rfl: 3 .  spironolactone (ALDACTONE) 25 MG tablet, Take 25 mg by mouth daily., Disp: , Rfl:  .  theophylline (THEODUR) 300 MG 12 hr tablet, Take 1 tablet (300 mg total) by mouth 2 (two) times daily., Disp: 180 tablet, Rfl: 1 .  traMADol (ULTRAM) 50 MG tablet, Take 50 mg by mouth every 6 (six) hours., Disp: , Rfl:   Exam: Current vital signs: BP (!) 165/75 (BP Location: Right Arm)   Pulse 72   Resp (!) 22   Wt 120.2 kg   LMP  (LMP Unknown)   SpO2 98%   BMI 45.49 kg/m  Vital signs in last 24 hours: Pulse Rate:  [72] 72 (11/30 1202) Resp:  [22] 22 (11/30 1202) BP: (165)/(75) 165/75 (11/30  1202) SpO2:  [98 %] 98 % (11/30 1202) Weight:  [120.2 kg] 120.2 kg (11/30 1202) General: Awake alert in no distress HEENT: Cephalic atraumatic Sound clear Cardiovascular regular rhythm Abdomen soft nondistended nontender Extremities warm with changes of chronic venous stasis observed Neurological exam Awake alert oriented x3 Speech is mildly dysarthric No evidence of aphasia Cranial: Pupils equal round react to light, has a right gaze preference, is able to come to midline but not able to cross the midline to look to the left, has left, normal hemianopsia, left lower facial weakness observed, facial sensation intact, tongue and palate midline. Motor exam: Flaccid left upper and lower extremity with minimal flicker movement.  Right side normal Sensory exam: Diminished on the left with extinction on double 70 stimulation Coordination: Intact on the right, unable to do on the left NIH stroke scale 14 as below NIHSS 1a Level of Conscious.: 0 1b LOC Questions: 0 1c LOC Commands: 0 2 Best Gaze: 1 3 Visual: 2 4 Facial Palsy: 1 5a Motor Arm - left: 3 5b Motor Arm - Right: 0 6a Motor Leg - Left: 3 6b Motor Leg - Right: 0 7 Limb Ataxia: 0 8 Sensory: 1 9 Best Language: 0 10 Dysarthria: 1 11 Extinct. and Inatten.: 2 TOTAL: 14    Labs I have reviewed labs in epic and the results pertinent to this consultation are:  CBC    Component Value Date/Time   WBC 11.5 (H) 11/20/2020 1135   RBC 5.06 11/20/2020 1135   HGB 15.3 (H) 11/20/2020 1140   HGB 14.2 10/12/2020 1112   HCT 45.0 11/20/2020 1140   HCT 43.6 10/12/2020 1112   PLT 395 11/20/2020 1135   PLT 357 10/12/2020 1112   MCV 96.8 11/20/2020 1135   MCV 94 10/12/2020 1112   MCV 90 11/28/2013 2312   MCH 29.6 11/20/2020 1135   MCHC 30.6 11/20/2020 1135   RDW 14.1 11/20/2020 1135   RDW 14.0 10/12/2020 1112   RDW 13.6 11/28/2013 2312   LYMPHSABS 3.4 11/20/2020 1135   LYMPHSABS 3.6 (H) 10/12/2020 1112   MONOABS 0.7 11/20/2020  1135   EOSABS 0.2 11/20/2020 1135   EOSABS 0.6 (H) 10/12/2020 1112  BASOSABS 0.1 11/20/2020 1135   BASOSABS 0.1 10/12/2020 1112    CMP     Component Value Date/Time   NA 138 11/20/2020 1140   NA 140 10/12/2020 1112   NA 140 11/28/2013 2312   K 3.7 11/20/2020 1140   K 4.1 11/28/2013 2312   CL 107 11/20/2020 1140   CL 108 (H) 11/28/2013 2312   CO2 23 10/12/2020 1112   CO2 27 11/28/2013 2312   GLUCOSE 148 (H) 11/20/2020 1140   GLUCOSE 130 (H) 11/28/2013 2312   BUN 19 11/20/2020 1140   BUN 16 10/12/2020 1112   BUN 29 (H) 11/28/2013 2312   CREATININE 0.90 11/20/2020 1140   CREATININE 0.79 11/28/2013 2312   CALCIUM 9.5 10/12/2020 1112   CALCIUM 8.7 11/28/2013 2312   PROT 6.4 10/12/2020 1112   PROT 6.9 11/28/2013 2312   ALBUMIN 3.9 10/12/2020 1112   ALBUMIN 3.6 11/28/2013 2312   AST 14 10/12/2020 1112   AST 24 11/28/2013 2312   ALT 14 10/12/2020 1112   ALT 21 11/28/2013 2312   ALKPHOS 74 10/12/2020 1112   ALKPHOS 65 11/28/2013 2312   BILITOT 0.5 10/12/2020 1112   BILITOT 0.3 11/28/2013 2312   GFRNONAA 77 10/12/2020 1112   GFRNONAA >60 11/28/2013 2312   GFRAA 89 10/12/2020 1112   GFRAA >60 11/28/2013 2312    Lipid Panel     Component Value Date/Time   CHOL 136 05/16/2020 1030   CHOL 196 03/20/2020 0830   TRIG 147 05/16/2020 1030   HDL 66 05/16/2020 1030   HDL 68 03/20/2020 0830   CHOLHDL 2.1 05/16/2020 1030   VLDL 29 05/16/2020 1030   LDLCALC 41 05/16/2020 1030   LDLCALC 103 (H) 03/20/2020 0830     Imaging I have reviewed the images obtained:  CT-scan of the brain-aspects 10.  Dense right MCA CTA with emergent right M1 occlusion.  Assessment:  69 year old with above past medical history with sudden onset of rightward gaze preference, left hemiparesis, left hemisensory loss and extinction on the left side with double 70 stimulation-consistent with a right MCA stroke. Head CT with no bleeding.  Aspects 10.  Dense right MCA. CTA head and neck confirmed an  emergent right M1 occlusion. Discussed with husband-not a candidate for IV TPA due to last dose of Eliquis last night. Offered endovascular thrombectomy which she agreed to after considering risks and benefits over the phone.  Witnessed by stroke RN and patient's ER RN. Discussed with interventional radiologist Dr. Estanislado Pandy and taken in for emergent endovascular thrombectomy.  Impression: Acute ischemic stroke-likely cardioembolic History of DVT Coronary artery disease Ischemic cardiomyopathy COPD Atrial fibrillation-on anticoagulation at home with Eliquis   Recommendations: Emergent endovascular thrombectomy-blood pressure goal-if successful, of systolic blood pressure between 1 20-1 40. If endovascular recanalization is not successful, allow for permissive hypertension-treat only if systolic blood pressures greater than 220. Frequent neurochecks Frequent vitals Telemetry MRI brain when able to 2D echocardiogram A1c EpiPen PT OT next speech therapy Attempt to extubate after the procedure-if remains intubated, PCCM consultation for vent management. Gentle hydration-normal saline at 75 cc an hour Chest x-ray for possible aspiration pneumonia.  Full code N.p.o. until cleared by bedside swallow screening or formal swallow evaluation once extubated   Discussed in detail the risks and benefits of the procedures with the husband with 2 RNs witnessing.  Present on arrival: Acute ischemic stroke, bilateral DVTs, hemiplegia, dysphagia/dysarthria, possible aspiration pneumonia, nonischemic cardiomyopathy, chronic systolic heart failure   -- Amie Portland, MD [facility] Pager: [phone] If 7pm to 7am, please call on call as listed on [facility].   CRITICAL CARE ATTESTATION Performed by: Amie Portland, MD Total critical care time:65 minutes Critical care time was exclusive of separately billable procedures and treating other patients and/or supervising  APPs/Residents/Students Critical care was necessary to treat or prevent imminent or life-threatening deterioration due to acute ischemic stroke This patient is critically ill and at significant risk for neurological worsening and/or death and care requires constant monitoring. Critical care was time spent personally by me on the following activities: development of treatment plan with patient and/or surrogate as well as nursing, discussions with consultants, evaluation of patient's response to treatment, examination of patient, obtaining history from patient or surrogate, ordering and performing treatments and interventions, ordering and review of laboratory studies, ordering and review of radiographic studies, pulse oximetry, re-evaluation of patient's condition, participation in multidisciplinary rounds and medical decision making of high complexity in the care of this patient.

## 2020-11-20 NOTE — Code Documentation (Signed)
Stroke Response Nurse Documentation Code Documentation  Laura [name] is a 69 y.o. female arriving to [place] ED via [facility] on 11/20/20 with past medical hx significant of hypertension, obesity, obstructive sleep apnea, hyperlipidemia, COPD, CAD and atrial fibrillation on Eliquis. Code stroke was activated by EMS. Patient from home where she was LKW at [address]. [address] and  now complaining of left sided weakness, facial droop and right gaze. IR team notified of suspected LVO.   Stroke team at the bedside on patient arrival. Labs drawn and patient cleared for CT by Dr. Melina Copa. Patient to CT with team. NIHSS 16, see documentation for details and code stroke times. Patient with right gaze preference , left hemianopia, left facial droop, left arm weakness, left leg weakness, left decreased sensation and Visual  neglect on exam. The following imaging was completed: CT, CTA head and neck.   Patient is not a candidate for tPA due to on Eliquis. IR activated 1156. Consent for procedure obtained over phone with patient's husband. Patient arrived to bay [address] at 1158. CRNA arrived 1209. Patient intubated 1225 and patient taken to IR suite 1227. Report given by ED RN Verdis Frederickson to IR RN Fraser Din. ICU notified of need for bed post IR.   Leverne Humbles Stroke Response RN

## 2020-11-20 NOTE — ED Notes (Signed)
No TPA per Dr. Rory Percy; patient took eliquis last night.

## 2020-11-20 NOTE — ED Provider Notes (Signed)
[facility] EMERGENCY DEPARTMENT Provider Note   CSN: [id number] Arrival date & time: 11/20/20  1129     History No chief complaint on file.   Laura [name] is a 69 y.o. female.  She was an LVO stroke activation in the field.  Acute onset of right gaze preference and acute weakness in left arm and left leg.  Vomited x1.  No prior stroke history. Last known well 1030am. Is on Eliquis for A. fib and last dose was last evening.  The history is provided by the EMS personnel and the patient.  Cerebrovascular Accident This is a new problem. The current episode started 3 to 5 hours ago. The problem occurs constantly. The problem has not changed since onset.Pertinent negatives include no chest pain, no abdominal pain, no headaches and no shortness of breath. Nothing aggravates the symptoms. Nothing relieves the symptoms. She has tried nothing for the symptoms. The treatment provided no relief.       Past Medical History:  Diagnosis Date  . Asthma   . Chronic atrial fibrillation ([place])    a. diagnosed in 09/2016; b. failed flecainide and propafenone due to LE swelling and SOB, could not afford Multaq; c. CHADS2VASc => 5 (CHF, HTN, age x 1, nonobs CAD, female)--> Eliquis  . COPD (chronic obstructive pulmonary disease) ([place])   . GERD (gastroesophageal reflux disease)   . HFmrEF (heart failure with mid-range ejection fraction) ([place])    a. 12/2019 Echo: EF 40-45%.  . Hyperlipidemia   . Hypertension   . NICM (nonischemic cardiomyopathy) ([place])    a. 12/2019 Echo: EF 40-45%, glob HK, mildly reduced RV fxn, sev dil LA. *HR 130 (afib) during study.  . Nonobstructive CAD (coronary artery disease)    a. Lexiscan Myoview 10/2016: no evidence of ischemia, EF 53%; b. 02/2020 Cath: LM nl, LAD 20p, 6m, LCX 20ost, OM1/2/3 nl, LPDA nl, LPL1/2 nl, LPAV nl, RCA small, nl.  . Obesity   . Obstructive sleep apnea   . Pulmonary hypertension ([place])   . Systolic dysfunction    a. TTE 10/2016:  EF 50%, mild LVH, moderately dilated LA, moderate MR/TR, mild pulmonary hypertension    Patient Active Problem List   Diagnosis Date Noted  . Lymphedema 06/07/2020  . Acute right-sided low back pain with right-sided sciatica 05/21/2020  . Chronic systolic heart failure ([place])   . Pulmonary hypertension, unspecified ([place])   . Nutritional deficiency 01/02/2020  . Memory deficit 01/02/2020  . Encounter for therapeutic drug level monitoring 01/02/2020  . B12 deficiency 01/02/2020  . PAD (peripheral artery disease) ([place]) 12/05/2019  . Intractable vomiting 10/09/2019  . Intestinal ischemia ([place])   . COPD with acute exacerbation ([place])   . Atrial fibrillation with rapid ventricular response ([place]) 09/11/2019  . Enteritis   . Peripheral edema 07/17/2019  . Atherosclerosis of both carotid arteries 07/17/2019  . Varicose veins of right lower extremity with inflammation 06/08/2019  . Gastroesophageal reflux disease with esophagitis 05/08/2019  . Fall at home, initial encounter 03/16/2019  . Contusion of right lower leg 03/16/2019  . Flu-like symptoms 02/03/2019  . Nausea 02/03/2019  . Suprapatellar bursitis of right knee 01/23/2019  . Pain in right leg 01/23/2019  . Cellulitis of right leg 01/23/2019  . Chronic venous stasis dermatitis of right lower extremity 01/23/2019  . Oropharyngeal candidiasis 01/23/2019  . Cellulitis of head except face 12/23/2018  . Screening for breast cancer 08/15/2018  . Chronic bronchitis with acute exacerbation ([place]) 08/15/2018  . Vitamin  D deficiency 08/15/2018  . Need for vaccination against Streptococcus pneumoniae using pneumococcal conjugate vaccine 13 08/15/2018  . Obstructive chronic bronchitis without exacerbation ([place]) 07/14/2018  . Body mass index (BMI) 45.0-49.9, adult 03/17/2018  . Asthma 02/25/2018  . Generalized anxiety disorder 12/16/2017  . Essential hypertension 12/16/2017  . Chronic respiratory failure with hypoxia ([place]) 12/16/2017  . Long  term (current) use of anticoagulants 12/16/2017  . Chronic atrial fibrillation ([place]) 12/16/2017  . Other specified functional intestinal disorders 12/16/2017  . Gastro-esophageal reflux disease without esophagitis 12/16/2017  . OSA on CPAP 12/16/2017  . Allergic rhinitis due to animal (cat) (dog) hair and dander 12/16/2017  . Allergic rhinitis due to pollen 12/16/2017  . Severe persistent asthma without complication 50/53/9767  . Cough 12/16/2017  . Shortness of breath 12/16/2017  . Superficial thrombophlebitis of right leg 11/17/2016  . Pain in limb 11/17/2016  . Chronic venous insufficiency 11/17/2016  . Swelling of limb 11/17/2016  . Cellulitis of buttock   . Cellulitis of right axilla   . Sepsis ([place]) 06/28/2016  . Cellulitis and abscess 06/28/2016  . Hypokalemia 06/28/2016  . Acute renal insufficiency 06/28/2016  . Hyperglycemia 06/28/2016  . Leukocytosis 06/28/2016  . Hypoxia 06/28/2016  . Collagenous colitis 05/13/2016    Past Surgical History:  Procedure Laterality Date  . BOWEL RESECTION  09/11/2019   Procedure: SMALL BOWEL RESECTION;  Surgeon: Herbert Pun, MD;  Location: [facility] ORS;  Service: General;;  . CARDIAC CATHETERIZATION    . cataract surgery    . COLONOSCOPY WITH PROPOFOL N/A 03/19/2020   Procedure: COLONOSCOPY WITH PROPOFOL;  Surgeon: [name], MD;  Location: [facility] ENDOSCOPY;  Service: Gastroenterology;  Laterality: N/A;  . CORONARY ANGIOPLASTY    . INCISION AND DRAINAGE ABSCESS Right 06/29/2016   Procedure: INCISION AND DRAINAGE ABSCESS;  Surgeon: Florene Glen, MD;  Location: [facility] ORS;  Service: General;  Laterality: Right;  . INCISION AND DRAINAGE OF WOUND Left 06/29/2016   Procedure: IRRIGATION AND DEBRIDEMENT WOUND;  Surgeon: Florene Glen, MD;  Location: [facility] ORS;  Service: General;  Laterality: Left;  . LAPAROSCOPIC RIGHT COLECTOMY  09/11/2019   Procedure: RIGHT COLECTOMY;  Surgeon: Herbert Pun, MD;  Location: [facility] ORS;  Service:  General;;  . LAPAROSCOPY N/A 09/11/2019   Procedure: LAPAROSCOPY DIAGNOSTIC;  Surgeon: Herbert Pun, MD;  Location: [facility] ORS;  Service: General;  Laterality: N/A;  . LAPAROTOMY N/A 09/13/2019   Procedure: REOPENING OF RECENT LAPAROTOMYANASTOMOSIS OF BOWEL;  Surgeon: Herbert Pun, MD;  Location: [facility] ORS;  Service: General;  Laterality: N/A;  . RIGHT/LEFT HEART CATH AND CORONARY ANGIOGRAPHY Bilateral 02/27/2020   Procedure: RIGHT/LEFT HEART CATH AND CORONARY ANGIOGRAPHY;  Surgeon: Wellington Hampshire, MD;  Location: [facility] CV LAB;  Service: Cardiovascular;  Laterality: Bilateral;  . VISCERAL ANGIOGRAPHY N/A 09/12/2019   Procedure: VISCERAL ANGIOGRAPHY;  Surgeon: Algernon Huxley, MD;  Location: [facility] CV LAB;  Service: Cardiovascular;  Laterality: N/A;     OB History   No obstetric history on file.     Family History  Problem Relation Age of Onset  . Dementia Mother   . Osteoporosis Mother   . Vascular Disease Mother   . COPD Father     Social History   Tobacco Use  . Smoking status: Never Smoker  . Smokeless tobacco: Never Used  Vaping Use  . Vaping Use: Never used  Substance Use Topics  . Alcohol use: No  . Drug use: No    Home Medications Prior to Admission  medications   Medication Sig Start Date End Date Taking? Authorizing Provider  Accu-Chek Softclix Lancets lancets Use as instructed to check blood sugars daily 2 hours after meal 10/25/20   [name], MD  albuterol (VENTOLIN HFA) 108 (90 Base) MCG/ACT inhaler Inhale 2 puffs into the lungs every 4 (four) hours as needed for wheezing or shortness of breath. 03/28/20   Ronnell Freshwater, NP  ALPRAZolam (XANAX) 0.25 MG tablet Take 1 tablet (0.25 mg total) by mouth 2 (two) times daily. 11/02/20   Luiz Ochoa, NP  apixaban (ELIQUIS) 5 MG TABS tablet Take 1 tablet (5 mg total) by mouth 2 (two) times daily. 06/18/20   Wellington Hampshire, MD  benzonatate (TESSALON) 100 MG capsule Take 1 capsule (100 mg  total) by mouth 2 (two) times daily as needed for cough. 04/03/20   Scarboro, Audie Clear, NP  budesonide (PULMICORT) 0.25 MG/2ML nebulizer solution Take 2 mLs (0.25 mg total) by nebulization 2 (two) times daily. 02/02/20   Scarboro, Audie Clear, NP  cyanocobalamin (,VITAMIN B-12,) 1000 MCG/ML injection Inject 1,000 mcg into the muscle every 30 (thirty) days.    [provider]  digoxin (LANOXIN) 0.125 MG tablet Take 1 tablet (0.125 mg total) by mouth daily. 03/20/20   Loel Dubonnet, NP  diltiazem (CARDIZEM CD) 360 MG 24 hr capsule Take 1 capsule (360 mg total) by mouth daily. 03/20/20   Loel Dubonnet, NP  EPINEPHrine 0.3 mg/0.3 mL IJ SOAJ injection Inject 0.3 mg into the muscle as needed for anaphylaxis.     [provider]  furosemide (LASIX) 20 MG tablet Take 20 mg by mouth 2 (two) times daily.    [provider]  glucose blood (ACCU-CHEK GUIDE) test strip Use as instructed to check blood sugars daily 2 hours after meal 10/25/20   [name], MD  ipratropium-albuterol (DUONEB) 0.5-2.5 (3) MG/3ML SOLN Take 3 mLs by nebulization every 4 (four) hours as needed. 03/28/20   Ronnell Freshwater, NP  losartan (COZAAR) 25 MG tablet Take 1 tablet (25 mg total) by mouth daily. 03/28/20   Ronnell Freshwater, NP  montelukast (SINGULAIR) 10 MG tablet Take 1 tablet (10 mg total) by mouth daily. 03/28/20   Ronnell Freshwater, NP  omeprazole (PRILOSEC) 40 MG capsule Take 1 capsule (40 mg total) by mouth daily. 10/02/20   [name], MD  ondansetron (ZOFRAN) 4 MG tablet Take 1 tablet (4 mg total) by mouth every 8 (eight) hours as needed for nausea or vomiting. 07/02/20   [name], MD  predniSONE (DELTASONE) 10 MG tablet Take 1 tablet (10 mg total) by mouth daily with breakfast. 09/13/20   Allyne Gee, MD  Probiotic Product (PROBIOTIC-10) CAPS Take 1 capsule by mouth daily.     [provider]  rosuvastatin (CRESTOR) 20 MG tablet Take 1 tablet (20 mg total) by mouth daily. 03/21/20  06/19/20  Loel Dubonnet, NP  spironolactone (ALDACTONE) 25 MG tablet Take 25 mg by mouth daily.    [provider]  theophylline (THEODUR) 300 MG 12 hr tablet Take 1 tablet (300 mg total) by mouth 2 (two) times daily. 03/28/20   Ronnell Freshwater, NP  traMADol (ULTRAM) 50 MG tablet Take 50 mg by mouth every 6 (six) hours. 08/13/20   [provider]    Allergies    Flecainide, Metoprolol, Propafenone, and Rivaroxaban  Review of Systems   Review of Systems  Constitutional: Negative for fever.  HENT: Negative  for sore throat.   Eyes: Negative for pain.  Respiratory: Negative for shortness of breath.   Cardiovascular: Negative for chest pain.  Gastrointestinal: Negative for abdominal pain.  Genitourinary: Negative for dysuria.  Musculoskeletal: Negative for neck pain.  Skin: Negative for rash.  Neurological: Negative for headaches.    Physical Exam Updated Vital Signs BP (!) 165/75 (BP Location: Right Arm)   Pulse 72   Resp (!) 22   Wt 120.2 kg   LMP  (LMP Unknown)   SpO2 98%   BMI 45.49 kg/m   Physical Exam Vitals and nursing note reviewed.  Constitutional:      General: She is not in acute distress.    Appearance: Normal appearance. She is well-developed. She is obese.  HENT:     Head: Normocephalic and atraumatic.  Eyes:     Conjunctiva/sclera: Conjunctivae normal.  Cardiovascular:     Rate and Rhythm: Normal rate. Rhythm irregular.     Heart sounds: No murmur heard.   Pulmonary:     Effort: Pulmonary effort is normal. No respiratory distress.     Breath sounds: Normal breath sounds. No stridor. No wheezing.  Abdominal:     Palpations: Abdomen is soft.     Tenderness: There is no abdominal tenderness.  Musculoskeletal:        General: No tenderness. Normal range of motion.     Cervical back: Neck supple.  Skin:    General: Skin is warm and dry.     Capillary Refill: Capillary refill takes less than 2 seconds.  Neurological:     Mental  Status: She is alert.     GCS: GCS eye subscore is 4. GCS verbal subscore is 5. GCS motor subscore is 6.     Motor: Weakness present.     Comments: She is awake and alert.  Follows commands on right side and has intact strength.  Minimal to no strength left arm left leg.  She also has a right gaze preference and barely crosses midline.  Speech is fluent.     ED Results / Procedures / Treatments   Labs (all labs ordered are listed, but only abnormal results are displayed) Labs Reviewed  APTT - Abnormal; Notable for the following components:      Result Value   aPTT 23 (*)    All other components within normal limits  CBC - Abnormal; Notable for the following components:   WBC 11.5 (*)    HCT 49.0 (*)    All other components within normal limits  DIFFERENTIAL - Abnormal; Notable for the following components:   Abs Immature Granulocytes 0.13 (*)    All other components within normal limits  I-STAT CHEM 8, ED - Abnormal; Notable for the following components:   Glucose, Bld 148 (*)    Calcium, Ion 1.02 (*)    TCO2 21 (*)    Hemoglobin 15.3 (*)    All other components within normal limits  CBG MONITORING, ED - Abnormal; Notable for the following components:   Glucose-Capillary 152 (*)    All other components within normal limits  PROTIME-INR  COMPREHENSIVE METABOLIC PANEL    EKG None  Radiology CT Code Stroke CTA Head W/WO contrast  Result Date: 11/20/2020 CLINICAL DATA:  Right MCA syndrome. EXAM: CT ANGIOGRAPHY HEAD AND NECK TECHNIQUE: Multidetector CT imaging of the head and neck was performed using the standard protocol during bolus administration of intravenous contrast. Multiplanar CT image reconstructions and MIPs were obtained to evaluate the vascular  anatomy. Carotid stenosis measurements (when applicable) are obtained utilizing NASCET criteria, using the distal internal carotid diameter as the denominator. CONTRAST:  Dose is currently not known COMPARISON:  Noncontrast  head CT from earlier today FINDINGS: CTA NECK FINDINGS Aortic arch: Atheromatous plaque.  Four vessel branching. Right carotid system: No significant stenosis or atherosclerosis. Negative for dissection or beading Left carotid system: No significant atherosclerosis and no stenosis or beading. Vertebral arteries: Calcified plaque at the left subclavian origin with 55% stenosis based on sagittal reformats. The left vertebral artery arises from the arch near the subclavian origin and is widely patent. The right vertebral artery is tortuous but widely patent. Skeleton: Unremarkable Other neck: Bilateral cataract resection. Upper chest: Negative Review of the MIP images confirms the above findings CTA HEAD FINDINGS Anterior circulation: Abrupt right M1 cut off consistent with acute thrombosis. Calcified plaque along the bilateral carotid siphons without significant stenosis. Negative for aneurysm. No contralateral embolism is seen. Posterior circulation: The vertebral and basilar arteries are widely patent. No branch occlusion, beading, or aneurysm. Venous sinuses: Not assessed on this arterial study Anatomic variants: None significant Review of the MIP images confirms the above findings Critical Value/emergent results were called by telephone at the time of interpretation on 11/20/2020 at 11:52 am to provider [facility] , who verbally acknowledged these results. IMPRESSION: 1. Emergent large vessel occlusion at the right M1 segment. 2. No underlying flow limiting stenosis or visible embolic source. Carotid atherosclerosis is mild for age. 3. 55% left subclavian artery origin stenosis. Electronically Signed   By: Monte Fantasia M.D.   On: 11/20/2020 11:59   CT Code Stroke CTA Neck W/WO contrast  Result Date: 11/20/2020 CLINICAL DATA:  Right MCA syndrome. EXAM: CT ANGIOGRAPHY HEAD AND NECK TECHNIQUE: Multidetector CT imaging of the head and neck was performed using the standard protocol during bolus administration  of intravenous contrast. Multiplanar CT image reconstructions and MIPs were obtained to evaluate the vascular anatomy. Carotid stenosis measurements (when applicable) are obtained utilizing NASCET criteria, using the distal internal carotid diameter as the denominator. CONTRAST:  Dose is currently not known COMPARISON:  Noncontrast head CT from earlier today FINDINGS: CTA NECK FINDINGS Aortic arch: Atheromatous plaque.  Four vessel branching. Right carotid system: No significant stenosis or atherosclerosis. Negative for dissection or beading Left carotid system: No significant atherosclerosis and no stenosis or beading. Vertebral arteries: Calcified plaque at the left subclavian origin with 55% stenosis based on sagittal reformats. The left vertebral artery arises from the arch near the subclavian origin and is widely patent. The right vertebral artery is tortuous but widely patent. Skeleton: Unremarkable Other neck: Bilateral cataract resection. Upper chest: Negative Review of the MIP images confirms the above findings CTA HEAD FINDINGS Anterior circulation: Abrupt right M1 cut off consistent with acute thrombosis. Calcified plaque along the bilateral carotid siphons without significant stenosis. Negative for aneurysm. No contralateral embolism is seen. Posterior circulation: The vertebral and basilar arteries are widely patent. No branch occlusion, beading, or aneurysm. Venous sinuses: Not assessed on this arterial study Anatomic variants: None significant Review of the MIP images confirms the above findings Critical Value/emergent results were called by telephone at the time of interpretation on 11/20/2020 at 11:52 am to provider [facility] , who verbally acknowledged these results. IMPRESSION: 1. Emergent large vessel occlusion at the right M1 segment. 2. No underlying flow limiting stenosis or visible embolic source. Carotid atherosclerosis is mild for age. 3. 55% left subclavian artery origin stenosis.  Electronically Signed  By: Monte Fantasia M.D.   On: 11/20/2020 11:59   CT HEAD CODE STROKE WO CONTRAST  Result Date: 11/20/2020 CLINICAL DATA:  Code stroke.  Left-sided weakness EXAM: CT HEAD WITHOUT CONTRAST TECHNIQUE: Contiguous axial images were obtained from the base of the skull through the vertex without intravenous contrast. COMPARISON:  September 2020 FINDINGS: Brain: No acute intracranial hemorrhage or mass effect. No loss of gray-white differentiation. Ventricles and sulci are within normal limits in size and configuration no extra-axial collection. Vascular: Hyperdensity of the right M1 MCA likely reflecting thrombus. There is intracranial atherosclerotic calcification at the skull base. Skull: Unremarkable. Sinuses/Orbits: No acute abnormality. Other: Mastoid air cells are clear. ASPECTS [facility] Stroke Program Early CT Score) - Ganglionic level infarction (caudate, lentiform nuclei, internal capsule, insula, M1-M3 cortex): 7 - Supraganglionic infarction (M4-M6 cortex): 3 Total score (0-10 with 10 being normal): 10 IMPRESSION: 1. No acute intracranial hemorrhage. 2. Dense right M1 MCA likely reflecting thrombus. 3. ASPECTS is 10. These results were called by telephone at the time of interpretation on 11/20/2020 at 11:55 am to provider Dr. Rory Percy, who verbally acknowledged these results. Electronically Signed   By: Macy Mis M.D.   On: 11/20/2020 12:00    Procedures .Critical Care Performed by: Hayden Rasmussen, MD Authorized by: Hayden Rasmussen, MD   Critical care provider statement:    Critical care time (minutes):  35   Critical care time was exclusive of:  Separately billable procedures and treating other patients   Critical care was necessary to treat or prevent imminent or life-threatening deterioration of the following conditions:  CNS failure or compromise   Critical care was time spent personally by me on the following activities:  Discussions with consultants,  evaluation of patient's response to treatment, examination of patient, ordering and performing treatments and interventions, ordering and review of laboratory studies, ordering and review of radiographic studies, pulse oximetry, re-evaluation of patient's condition, obtaining history from patient or surrogate, review of old charts and development of treatment plan with patient or surrogate   (including critical care time)  Medications Ordered in ED Medications  sodium chloride flush (NS) 0.9 % injection 3 mL (has no administration in time range)  iohexol (OMNIPAQUE) 350 MG/ML injection 75 mL (75 mLs Intravenous Contrast Given 11/20/20 1154)    ED Course  I have reviewed the triage vital signs and the nursing notes.  Pertinent labs & imaging results that were available during my care of the patient were reviewed by me and considered in my medical decision making (see chart for details).    MDM Rules/Calculators/A&P                         This patient complains of acute onset of left-sided weakness and right gaze preference; this involves an extensive number of treatment Options and is a complaint that carries with it a high risk of complications and Morbidity. The differential includes stroke, LVO, bleed, dissection, arrhythmia, hypoglycemia  I ordered, reviewed and interpreted labs, which included CBC with mildly low.  White count, stable hemoglobin, chemistries markable other than elevated glucose, Covid testing negative, coags normal I ordered imaging studies which included CT head CT angio head and neck and I independently    visualized and interpreted imaging which showed acute occlusion Additional history obtained from EMS Previous records obtained and reviewed in epic, no recent admissions I consulted Dr. Rory Percy neuro hospitalist and discussed lab and imaging findings  Critical Interventions:  Identification of and management of LVO emergent deferred to interventional radiology  suite  After the interventions stated above, I reevaluated the patient and found still with deficits and being transferred to IR   Final Clinical Impression(s) / ED Diagnoses Final diagnoses:  Acute cerebrovascular accident (CVA) due to occlusion of right middle cerebral artery [facility])    Rx / DC Orders ED Discharge Orders    None       Hayden Rasmussen, MD 11/20/20 [phone]

## 2020-11-20 NOTE — Anesthesia Procedure Notes (Signed)
Procedure Name: Intubation Date/Time: 11/20/2020 12:38 PM Performed by: Glynda Jaeger, CRNA Pre-anesthesia Checklist: Patient identified, Patient being monitored, Timeout performed, Emergency Drugs available and Suction available Patient Re-evaluated:Patient Re-evaluated prior to induction Oxygen Delivery Method: Circle System Utilized Preoxygenation: Pre-oxygenation with 100% oxygen Induction Type: IV induction Ventilation: Mask ventilation without difficulty Laryngoscope Size: 3 and Glidescope Grade View: Grade I Tube type: Oral Tube size: 7.0 mm Number of attempts: 1 Airway Equipment and Method: Stylet Placement Confirmation: ETT inserted through vocal cords under direct vision,  positive ETCO2 and breath sounds checked- equal and bilateral Secured at: 21 cm Tube secured with: Tape Dental Injury: Teeth and Oropharynx as per pre-operative assessment

## 2020-11-20 NOTE — Sedation Documentation (Signed)
Right femoral sheath removed, 8 Fr. angioseal deployed

## 2020-11-21 ENCOUNTER — Inpatient Hospital Stay (HOSPITAL_COMMUNITY): Payer: Medicare Other

## 2020-11-21 ENCOUNTER — Encounter (HOSPITAL_COMMUNITY): Payer: Self-pay | Admitting: Radiology

## 2020-11-21 DIAGNOSIS — I6601 Occlusion and stenosis of right middle cerebral artery: Secondary | ICD-10-CM

## 2020-11-21 DIAGNOSIS — I6389 Other cerebral infarction: Secondary | ICD-10-CM

## 2020-11-21 DIAGNOSIS — I63411 Cerebral infarction due to embolism of right middle cerebral artery: Principal | ICD-10-CM

## 2020-11-21 DIAGNOSIS — I63511 Cerebral infarction due to unspecified occlusion or stenosis of right middle cerebral artery: Secondary | ICD-10-CM

## 2020-11-21 DIAGNOSIS — I482 Chronic atrial fibrillation, unspecified: Secondary | ICD-10-CM

## 2020-11-21 LAB — CBC WITH DIFFERENTIAL/PLATELET
Abs Immature Granulocytes: 0.07 10*3/uL (ref 0.00–0.07)
Basophils Absolute: 0 10*3/uL (ref 0.0–0.1)
Basophils Relative: 0 %
Eosinophils Absolute: 0.3 10*3/uL (ref 0.0–0.5)
Eosinophils Relative: 3 %
HCT: 40.7 % (ref 36.0–46.0)
Hemoglobin: 13.1 g/dL (ref 12.0–15.0)
Immature Granulocytes: 1 %
Lymphocytes Relative: 31 %
Lymphs Abs: 3 10*3/uL (ref 0.7–4.0)
MCH: 29.6 pg (ref 26.0–34.0)
MCHC: 32.2 g/dL (ref 30.0–36.0)
MCV: 92.1 fL (ref 80.0–100.0)
Monocytes Absolute: 0.8 10*3/uL (ref 0.1–1.0)
Monocytes Relative: 8 %
Neutro Abs: 5.5 10*3/uL (ref 1.7–7.7)
Neutrophils Relative %: 57 %
Platelets: 357 10*3/uL (ref 150–400)
RBC: 4.42 MIL/uL (ref 3.87–5.11)
RDW: 14.3 % (ref 11.5–15.5)
WBC: 9.7 10*3/uL (ref 4.0–10.5)
nRBC: 0 % (ref 0.0–0.2)

## 2020-11-21 LAB — HIV ANTIBODY (ROUTINE TESTING W REFLEX): HIV Screen 4th Generation wRfx: NONREACTIVE

## 2020-11-21 LAB — BASIC METABOLIC PANEL
Anion gap: 12 (ref 5–15)
BUN: 20 mg/dL (ref 8–23)
CO2: 22 mmol/L (ref 22–32)
Calcium: 8.8 mg/dL — ABNORMAL LOW (ref 8.9–10.3)
Chloride: 106 mmol/L (ref 98–111)
Creatinine, Ser: 0.93 mg/dL (ref 0.44–1.00)
GFR, Estimated: >60 mL/min (ref >60)
Glucose, Bld: 114 mg/dL — ABNORMAL HIGH (ref 70–99)
Potassium: 3.7 mmol/L (ref 3.5–5.1)
Sodium: 140 mmol/L (ref 135–145)

## 2020-11-21 LAB — ECHOCARDIOGRAM COMPLETE
Area-P 1/2: 2.62 cm2
Height: 64 in
S' Lateral: 3.9 cm
Weight: 4239.89 oz

## 2020-11-21 LAB — LIPID PANEL
Cholesterol: 151 mg/dL (ref 0–200)
HDL: 46 mg/dL (ref >40)
LDL Cholesterol: 80 mg/dL (ref 0–99)
Total CHOL/HDL Ratio: 3.3 RATIO
Triglycerides: 125 mg/dL (ref <150)
VLDL: 25 mg/dL (ref 0–40)

## 2020-11-21 LAB — HEMOGLOBIN A1C
Hgb A1c MFr Bld: 6.2 % — ABNORMAL HIGH (ref 4.8–5.6)
Mean Plasma Glucose: 131.24 mg/dL

## 2020-11-21 MED ORDER — ROSUVASTATIN CALCIUM 20 MG PO TABS
20.0000 mg | ORAL_TABLET | Freq: Every day | ORAL | Status: DC
  Administered 2020-11-21 – 2020-11-23 (×3): 20 mg via ORAL
  Filled 2020-11-21 (×3): qty 1

## 2020-11-21 MED ORDER — PROBIOTIC-10 PO CAPS: 1.0000 | ORAL_CAPSULE | Freq: Every day | ORAL | Status: DC

## 2020-11-21 MED ORDER — ORAL CARE MOUTH RINSE
15.0000 mL | Freq: Two times a day (BID) | OROMUCOSAL | Status: DC
  Administered 2020-11-21 – 2020-11-23 (×4): 15 mL via OROMUCOSAL

## 2020-11-21 MED ORDER — TRAMADOL HCL 50 MG PO TABS
50.0000 mg | ORAL_TABLET | Freq: Four times a day (QID) | ORAL | Status: DC | PRN
  Administered 2020-11-21 – 2020-11-22 (×2): 50 mg via ORAL
  Filled 2020-11-21 (×2): qty 1

## 2020-11-21 MED ORDER — DIGOXIN 125 MCG PO TABS
0.1250 mg | ORAL_TABLET | Freq: Every day | ORAL | Status: DC
  Administered 2020-11-21 – 2020-11-23 (×3): 0.125 mg via ORAL
  Filled 2020-11-21 (×3): qty 1

## 2020-11-21 MED ORDER — CHLORHEXIDINE GLUCONATE 0.12 % MT SOLN
15.0000 mL | Freq: Two times a day (BID) | OROMUCOSAL | Status: DC
  Administered 2020-11-21 – 2020-11-23 (×5): 15 mL via OROMUCOSAL
  Filled 2020-11-21 (×3): qty 15

## 2020-11-21 MED ORDER — PANTOPRAZOLE SODIUM 40 MG PO TBEC
40.0000 mg | DELAYED_RELEASE_TABLET | Freq: Every day | ORAL | Status: DC
  Administered 2020-11-21 – 2020-11-23 (×3): 40 mg via ORAL
  Filled 2020-11-21 (×3): qty 1

## 2020-11-21 MED ORDER — DILTIAZEM HCL ER COATED BEADS 180 MG PO CP24
360.0000 mg | ORAL_CAPSULE | Freq: Every day | ORAL | Status: DC
  Administered 2020-11-21 – 2020-11-23 (×3): 360 mg via ORAL
  Filled 2020-11-21 (×2): qty 1
  Filled 2020-11-21: qty 2

## 2020-11-21 MED ORDER — THEOPHYLLINE ER 300 MG PO TB12
300.0000 mg | ORAL_TABLET | Freq: Two times a day (BID) | ORAL | Status: DC
  Administered 2020-11-21 – 2020-11-23 (×5): 300 mg via ORAL
  Filled 2020-11-21 (×6): qty 1

## 2020-11-21 MED ORDER — INFLUENZA VAC A&B SA ADJ QUAD 0.5 ML IM PRSY
0.5000 mL | PREFILLED_SYRINGE | INTRAMUSCULAR | Status: DC | PRN
  Filled 2020-11-21: qty 0.5

## 2020-11-21 MED ORDER — BENZONATATE 100 MG PO CAPS
100.0000 mg | ORAL_CAPSULE | Freq: Two times a day (BID) | ORAL | Status: DC | PRN
  Filled 2020-11-21: qty 1

## 2020-11-21 MED ORDER — ASPIRIN EC 325 MG PO TBEC
325.0000 mg | DELAYED_RELEASE_TABLET | Freq: Every day | ORAL | Status: DC
  Administered 2020-11-21 – 2020-11-23 (×3): 325 mg via ORAL
  Filled 2020-11-21 (×3): qty 1

## 2020-11-21 MED ORDER — IPRATROPIUM-ALBUTEROL 0.5-2.5 (3) MG/3ML IN SOLN: 3.0000 mL | RESPIRATORY_TRACT | Status: DC | PRN

## 2020-11-21 MED ORDER — RISAQUAD PO CAPS
1.0000 | ORAL_CAPSULE | Freq: Every day | ORAL | Status: DC
  Administered 2020-11-21 – 2020-11-23 (×3): 1 via ORAL
  Filled 2020-11-21 (×3): qty 1

## 2020-11-21 MED ORDER — MONTELUKAST SODIUM 10 MG PO TABS
10.0000 mg | ORAL_TABLET | Freq: Every day | ORAL | Status: DC
  Administered 2020-11-21 – 2020-11-23 (×3): 10 mg via ORAL
  Filled 2020-11-21 (×3): qty 1

## 2020-11-21 MED ORDER — BUDESONIDE 0.25 MG/2ML IN SUSP
0.2500 mg | Freq: Two times a day (BID) | RESPIRATORY_TRACT | Status: DC
  Administered 2020-11-21 – 2020-11-23 (×4): 0.25 mg via RESPIRATORY_TRACT
  Filled 2020-11-21 (×4): qty 2

## 2020-11-21 MED ORDER — ALPRAZOLAM 0.25 MG PO TABS
0.2500 mg | ORAL_TABLET | Freq: Two times a day (BID) | ORAL | Status: DC
  Administered 2020-11-21 – 2020-11-23 (×5): 0.25 mg via ORAL
  Filled 2020-11-21 (×5): qty 1

## 2020-11-21 MED ORDER — FUROSEMIDE 20 MG PO TABS
20.0000 mg | ORAL_TABLET | Freq: Two times a day (BID) | ORAL | Status: DC
  Administered 2020-11-21 – 2020-11-23 (×4): 20 mg via ORAL
  Filled 2020-11-21 (×4): qty 1

## 2020-11-21 MED ORDER — SPIRONOLACTONE 25 MG PO TABS
25.0000 mg | ORAL_TABLET | Freq: Every day | ORAL | Status: DC
  Administered 2020-11-21 – 2020-11-23 (×3): 25 mg via ORAL
  Filled 2020-11-21 (×3): qty 1

## 2020-11-21 MED ORDER — PREDNISONE 20 MG PO TABS: 10.0000 mg | ORAL_TABLET | Freq: Every day | ORAL | Status: DC

## 2020-11-21 NOTE — Progress Notes (Signed)
Referring Physician(s): Dr. Rory Percy  Supervising Physician: Luanne Bras  Patient Status:  Laura [name] - In-pt  Chief Complaint: CODE STROKE  Subjective: Patient sitting up in bed.  Communicative.  L facial droop but speech largely intact.  Moving bilateral extremities; ongoing left-sided weakness.   Allergies: Flecainide, Metoprolol, Propafenone, and Rivaroxaban  Medications: Prior to Admission medications   Medication Sig Start Date End Date Taking? Authorizing Provider  apixaban (ELIQUIS) 5 MG TABS tablet Take 1 tablet (5 mg total) by mouth 2 (two) times daily. 06/18/20  Yes Wellington Hampshire, MD  ipratropium-albuterol (DUONEB) 0.5-2.5 (3) MG/3ML SOLN Take 3 mLs by nebulization every 4 (four) hours as needed. 03/28/20  Yes Boscia, Greer Ee, NP  rosuvastatin (CRESTOR) 20 MG tablet Take 1 tablet (20 mg total) by mouth daily. 03/21/20 11/20/21 Yes Loel Dubonnet, NP  theophylline (THEODUR) 300 MG 12 hr tablet Take 1 tablet (300 mg total) by mouth 2 (two) times daily. 03/28/20  Yes Boscia, Greer Ee, NP  traMADol (ULTRAM) 50 MG tablet Take 50 mg by mouth every 6 (six) hours as needed for moderate pain.  08/13/20  Yes [provider]  Accu-Chek Softclix Lancets lancets Use as instructed to check blood sugars daily 2 hours after meal 10/25/20   [name], MD  albuterol (VENTOLIN HFA) 108 (90 Base) MCG/ACT inhaler Inhale 2 puffs into the lungs every 4 (four) hours as needed for wheezing or shortness of breath. 03/28/20   Ronnell Freshwater, NP  ALPRAZolam (XANAX) 0.25 MG tablet Take 1 tablet (0.25 mg total) by mouth 2 (two) times daily. 11/02/20   Luiz Ochoa, NP  benzonatate (TESSALON) 100 MG capsule Take 1 capsule (100 mg total) by mouth 2 (two) times daily as needed for cough. 04/03/20   Scarboro, Audie Clear, NP  budesonide (PULMICORT) 0.25 MG/2ML nebulizer solution Take 2 mLs (0.25 mg total) by nebulization 2 (two) times daily. 02/02/20   Scarboro, Audie Clear, NP  cyanocobalamin  (,VITAMIN B-12,) 1000 MCG/ML injection Inject 1,000 mcg into the muscle every 30 (thirty) days.    [provider]  digoxin (LANOXIN) 0.125 MG tablet Take 1 tablet (0.125 mg total) by mouth daily. 03/20/20   Loel Dubonnet, NP  diltiazem (CARDIZEM CD) 360 MG 24 hr capsule Take 1 capsule (360 mg total) by mouth daily. 03/20/20   Loel Dubonnet, NP  EPINEPHrine 0.3 mg/0.3 mL IJ SOAJ injection Inject 0.3 mg into the muscle as needed for anaphylaxis.     [provider]  furosemide (LASIX) 20 MG tablet Take 20 mg by mouth 2 (two) times daily.    [provider]  glucose blood (ACCU-CHEK GUIDE) test strip Use as instructed to check blood sugars daily 2 hours after meal 10/25/20   [name], MD  losartan (COZAAR) 25 MG tablet Take 1 tablet (25 mg total) by mouth daily. 03/28/20   Ronnell Freshwater, NP  montelukast (SINGULAIR) 10 MG tablet Take 1 tablet (10 mg total) by mouth daily. 03/28/20   Ronnell Freshwater, NP  omeprazole (PRILOSEC) 40 MG capsule Take 1 capsule (40 mg total) by mouth daily. 10/02/20   [name], MD  ondansetron (ZOFRAN) 4 MG tablet Take 1 tablet (4 mg total) by mouth every 8 (eight) hours as needed for nausea or vomiting. 07/02/20   [name], MD  predniSONE (DELTASONE) 10 MG tablet Take 1 tablet (10 mg total) by mouth daily with breakfast. 09/13/20   Allyne Gee, MD  Probiotic Product (PROBIOTIC-10) CAPS Take 1 capsule by mouth daily.     [provider]  spironolactone (ALDACTONE) 25 MG tablet Take 25 mg by mouth daily.    [provider]     Vital Signs: BP 138/75   Pulse (!) 49   Temp 97.8 F (36.6 C) (Oral)   Resp 13   Ht 5\' 4"  (1.626 m)   Wt 264 lb 15.9 oz (120.2 kg)   LMP  (LMP Unknown)   SpO2 94%   BMI 45.49 kg/m   Physical Exam  NAD, alert Neuro: communicative, alert, oriented.  L facial droop noted at the mouth. Tongue midline. LUE strength 4/5, noticeable weakness but ultimately able to raise and resist.  LLE strength 4+/5 Groin: soft, intact.  No evidence of pseudoaneurysm or hematoma.   Imaging: CT Code Stroke CTA Head W/WO contrast  Result Date: 11/20/2020 CLINICAL DATA:  Right MCA syndrome. EXAM: CT ANGIOGRAPHY HEAD AND NECK TECHNIQUE: Multidetector CT imaging of the head and neck was performed using the standard protocol during bolus administration of intravenous contrast. Multiplanar CT image reconstructions and MIPs were obtained to evaluate the vascular anatomy. Carotid stenosis measurements (when applicable) are obtained utilizing NASCET criteria, using the distal internal carotid diameter as the denominator. CONTRAST:  Dose is currently not known COMPARISON:  Noncontrast head CT from earlier today FINDINGS: CTA NECK FINDINGS Aortic arch: Atheromatous plaque.  Four vessel branching. Right carotid system: No significant stenosis or atherosclerosis. Negative for dissection or beading Left carotid system: No significant atherosclerosis and no stenosis or beading. Vertebral arteries: Calcified plaque at the left subclavian origin with 55% stenosis based on sagittal reformats. The left vertebral artery arises from the arch near the subclavian origin and is widely patent. The right vertebral artery is tortuous but widely patent. Skeleton: Unremarkable Other neck: Bilateral cataract resection. Upper chest: Negative Review of the MIP images confirms the above findings CTA HEAD FINDINGS Anterior circulation: Abrupt right M1 cut off consistent with acute thrombosis. Calcified plaque along the bilateral carotid siphons without significant stenosis. Negative for aneurysm. No contralateral embolism is seen. Posterior circulation: The vertebral and basilar arteries are widely patent. No branch occlusion, beading, or aneurysm. Venous sinuses: Not assessed on this arterial study Anatomic variants: None significant Review of the MIP images confirms the above findings Critical Value/emergent results were called by  telephone at the time of interpretation on 11/20/2020 at 11:52 am to provider [name] , who verbally acknowledged these results. IMPRESSION: 1. Emergent large vessel occlusion at the right M1 segment. 2. No underlying flow limiting stenosis or visible embolic source. Carotid atherosclerosis is mild for age. 3. 55% left subclavian artery origin stenosis. Electronically Signed   By: Monte Fantasia M.D.   On: 11/20/2020 11:59   CT Code Stroke CTA Neck W/WO contrast  Result Date: 11/20/2020 CLINICAL DATA:  Right MCA syndrome. EXAM: CT ANGIOGRAPHY HEAD AND NECK TECHNIQUE: Multidetector CT imaging of the head and neck was performed using the standard protocol during bolus administration of intravenous contrast. Multiplanar CT image reconstructions and MIPs were obtained to evaluate the vascular anatomy. Carotid stenosis measurements (when applicable) are obtained utilizing NASCET criteria, using the distal internal carotid diameter as the denominator. CONTRAST:  Dose is currently not known COMPARISON:  Noncontrast head CT from earlier today FINDINGS: CTA NECK FINDINGS Aortic arch: Atheromatous plaque.  Four vessel branching. Right carotid system: No significant stenosis or atherosclerosis. Negative for dissection or beading Left carotid system: No significant atherosclerosis and no stenosis  or beading. Vertebral arteries: Calcified plaque at the left subclavian origin with 55% stenosis based on sagittal reformats. The left vertebral artery arises from the arch near the subclavian origin and is widely patent. The right vertebral artery is tortuous but widely patent. Skeleton: Unremarkable Other neck: Bilateral cataract resection. Upper chest: Negative Review of the MIP images confirms the above findings CTA HEAD FINDINGS Anterior circulation: Abrupt right M1 cut off consistent with acute thrombosis. Calcified plaque along the bilateral carotid siphons without significant stenosis. Negative for aneurysm. No  contralateral embolism is seen. Posterior circulation: The vertebral and basilar arteries are widely patent. No branch occlusion, beading, or aneurysm. Venous sinuses: Not assessed on this arterial study Anatomic variants: None significant Review of the MIP images confirms the above findings Critical Value/emergent results were called by telephone at the time of interpretation on 11/20/2020 at 11:52 am to provider [facility] , who verbally acknowledged these results. IMPRESSION: 1. Emergent large vessel occlusion at the right M1 segment. 2. No underlying flow limiting stenosis or visible embolic source. Carotid atherosclerosis is mild for age. 3. 55% left subclavian artery origin stenosis. Electronically Signed   By: Monte Fantasia M.D.   On: 11/20/2020 11:59   CT HEAD CODE STROKE WO CONTRAST  Result Date: 11/20/2020 CLINICAL DATA:  Code stroke.  Left-sided weakness EXAM: CT HEAD WITHOUT CONTRAST TECHNIQUE: Contiguous axial images were obtained from the base of the skull through the vertex without intravenous contrast. COMPARISON:  September 2020 FINDINGS: Brain: No acute intracranial hemorrhage or mass effect. No loss of gray-white differentiation. Ventricles and sulci are within normal limits in size and configuration no extra-axial collection. Vascular: Hyperdensity of the right M1 MCA likely reflecting thrombus. There is intracranial atherosclerotic calcification at the skull base. Skull: Unremarkable. Sinuses/Orbits: No acute abnormality. Other: Mastoid air cells are clear. ASPECTS [facility] Stroke Program Early CT Score) - Ganglionic level infarction (caudate, lentiform nuclei, internal capsule, insula, M1-M3 cortex): 7 - Supraganglionic infarction (M4-M6 cortex): 3 Total score (0-10 with 10 being normal): 10 IMPRESSION: 1. No acute intracranial hemorrhage. 2. Dense right M1 MCA likely reflecting thrombus. 3. ASPECTS is 10. These results were called by telephone at the time of interpretation on  11/20/2020 at 11:55 am to provider Dr. Rory Percy, who verbally acknowledged these results. Electronically Signed   By: Macy Mis M.D.   On: 11/20/2020 12:00    Labs:  CBC: Recent Labs    01/02/20 1452 09/27/20 0952 10/12/20 1112 11/20/20 1135 11/20/20 1140 11/21/20 0500  WBC  --  7.2 8.1 11.5*  --  9.7  HGB   < > 13.9 14.2 15.0 15.3* 13.1  HCT   < > 41.7 43.6 49.0* 45.0 40.7  PLT  --  303 357 395  --  357   < > = values in this interval not displayed.    COAGS: Recent Labs    06/11/20 1149 06/13/20 1433 11/20/20 1135  INR 3.9* 3.6* 1.2  APTT  --   --  23*    BMP: Recent Labs    03/02/20 1446 03/02/20 1446 03/20/20 0830 03/20/20 0830 05/16/20 1030 05/16/20 1030 09/27/20 0952 10/12/20 1112 11/20/20 1135 11/20/20 1140 11/21/20 0500  NA 135   < > 144   < > 137   < > 137 140 136 138 140  K 4.8   < > 4.2   < > 4.3   < > 3.8 4.3 4.0 3.7 3.7  CL 102   < > 102   < >  101   < > 100 101 103 107 106  CO2 25   < > 24  --  27   < > 27 23 21*  --  22  GLUCOSE 167*   < > 92   < > 119*   < > 123* 115* 153* 148* 114*  BUN 27*   < > 14   < > 14   < > 14 16 16 19 20   CALCIUM 9.5   < > 9.4  --  9.0   < > 9.6 9.5 8.9  --  8.8*  CREATININE 0.87   < > 0.87   < > 0.71   < > 0.95 0.78 1.06* 0.90 0.93  GFRNONAA >60   < > 68  --  >60   < > >60 77 57*  --  >60  GFRAA >60  --  79  --  >60  --   --  89  --   --   --    < > = values in this interval not displayed.    LIVER FUNCTION TESTS: Recent Labs    05/16/20 1030 10/12/20 1112 11/20/20 1135  BILITOT 1.0 0.5 0.8  AST 20 14 15   ALT 19 14 15   ALKPHOS 56 74 61  PROT 6.7 6.4 6.8  ALBUMIN 3.9 3.9 3.5    Assessment and Plan: S/P bilateral common carotid arteriograms followed by revascularization  of Rt MCA occlusion  With  1 pass with 6.5 mm x 45 mm embotrap  With aspiration achieving a TICI 3 revascularization Post-procedure with small SAH in the post perislyvian fissure Patient assessed this AM.  She is resting  comfortably. Chatty with intact cognition and speech.  L facial droop, LUE weakness but able to raise above the shoulder, strength intact in LLE.  Groin soft, intact.  No evidence of pseudoaneurysm or hematoma.  Further management with stroke team and therapy services.  IR available as needed.   Electronically Signed: Docia Barrier, PA 11/21/2020, 11:46 AM   I spent a total of 15 Minutes at the the patient's bedside AND on the patient's hospital floor or unit, greater than 50% of which was counseling/coordinating care for R MCA occlusion.

## 2020-11-21 NOTE — Progress Notes (Signed)
Patients daughter brought her home CPAP I set it up in her room and bled in 3 lpm oxygen and added H20.  Patient will self administer as she does at home when ready for bed.

## 2020-11-21 NOTE — Anesthesia Postprocedure Evaluation (Signed)
Anesthesia Post Note  Patient: Laura [name]  Procedure(s) Performed: IR WITH ANESTHESIA - CODE STROKE (N/A )     Patient location during evaluation: PACU Anesthesia Type: General Level of consciousness: awake and alert Pain management: pain level controlled Vital Signs Assessment: post-procedure vital signs reviewed and stable Respiratory status: spontaneous breathing, nonlabored ventilation, respiratory function stable and patient connected to nasal cannula oxygen Cardiovascular status: blood pressure returned to baseline and stable Postop Assessment: no apparent nausea or vomiting Anesthetic complications: no   No complications documented.  Last Vitals:  Vitals:   11/21/20 1100 11/21/20 1140  BP: 136/69   Pulse: (!) 30   Resp: 17   Temp:  36.6 C  SpO2: (!) 89%     Last Pain:  Vitals:   11/21/20 1345  TempSrc:   PainSc: 6                  [name]

## 2020-11-21 NOTE — Progress Notes (Signed)
   11/21/20 1050  Clinical Encounter Type  Visited With Patient and family together;Patient  Visit Type Initial  Referral From Nurse  Consult/Referral To Chaplain   Chaplain responded to consult request. Pt stated she was wanting to look over the AD document with her granddaughter. Pt declined AD education today and Chaplain let her know chaplains remain available for questions. Pt expressed she has a strong support system in her family. When Chaplain left, Pt's daughter was at bedside. Chaplain remains available as needed.   This note was prepared by Chaplain Resident, Dante Gang, MDiv. Chaplain remains available as needed through the on-call pager: [phone].

## 2020-11-21 NOTE — Progress Notes (Signed)
SLP Cancellation Note  Patient Details Name: TYNISHA OGAN MRN: [id number] DOB: [birth year]   Cancelled treatment:       Reason Eval/Treat Not Completed: Patient at procedure or test/unavailable (leaving the floor for MRI). Will f/u as able.   Osie Bond., M.A. [facility] Pager [phone] Office [phone]  11/21/2020, 8:45 AM

## 2020-11-21 NOTE — Evaluation (Addendum)
Clinical/Bedside Swallow Evaluation Patient Details  Name: Laura [name] MRN: [id number] Date of Birth: 03-15-1951  Today's Date: 11/21/2020 Time: SLP Start Time (ACUTE ONLY): 1106 SLP Stop Time (ACUTE ONLY): 1120 SLP Time Calculation (min) (ACUTE ONLY): 14 min  Past Medical History:  Past Medical History:  Diagnosis Date  . Asthma   . Chronic atrial fibrillation ([place])    a. diagnosed in 09/2016; b. failed flecainide and propafenone due to LE swelling and SOB, could not afford Multaq; c. CHADS2VASc => 5 (CHF, HTN, age x 1, nonobs CAD, female)--> Eliquis  . COPD (chronic obstructive pulmonary disease) ([place])   . GERD (gastroesophageal reflux disease)   . HFmrEF (heart failure with mid-range ejection fraction) ([place])    a. 12/2019 Echo: EF 40-45%.  . Hyperlipidemia   . Hypertension   . NICM (nonischemic cardiomyopathy) ([place])    a. 12/2019 Echo: EF 40-45%, glob HK, mildly reduced RV fxn, sev dil LA. *HR 130 (afib) during study.  . Nonobstructive CAD (coronary artery disease)    a. Lexiscan Myoview 10/2016: no evidence of ischemia, EF 53%; b. 02/2020 Cath: LM nl, LAD 20p, 58m, LCX 20ost, OM1/2/3 nl, LPDA nl, LPL1/2 nl, LPAV nl, RCA small, nl.  . Obesity   . Obstructive sleep apnea   . Pulmonary hypertension ([place])   . Systolic dysfunction    a. TTE 10/2016: EF 50%, mild LVH, moderately dilated LA, moderate MR/TR, mild pulmonary hypertension   Past Surgical History:  Past Surgical History:  Procedure Laterality Date  . BOWEL RESECTION  09/11/2019   Procedure: SMALL BOWEL RESECTION;  Surgeon: Herbert Pun, MD;  Location: [facility] ORS;  Service: General;;  . CARDIAC CATHETERIZATION    . cataract surgery    . COLONOSCOPY WITH PROPOFOL N/A 03/19/2020   Procedure: COLONOSCOPY WITH PROPOFOL;  Surgeon: [name], MD;  Location: [facility] ENDOSCOPY;  Service: Gastroenterology;  Laterality: N/A;  . CORONARY ANGIOPLASTY    . INCISION AND DRAINAGE ABSCESS Right 06/29/2016   Procedure: INCISION AND  DRAINAGE ABSCESS;  Surgeon: Florene Glen, MD;  Location: [facility] ORS;  Service: General;  Laterality: Right;  . INCISION AND DRAINAGE OF WOUND Left 06/29/2016   Procedure: IRRIGATION AND DEBRIDEMENT WOUND;  Surgeon: Florene Glen, MD;  Location: [facility] ORS;  Service: General;  Laterality: Left;  . LAPAROSCOPIC RIGHT COLECTOMY  09/11/2019   Procedure: RIGHT COLECTOMY;  Surgeon: Herbert Pun, MD;  Location: [facility] ORS;  Service: General;;  . LAPAROSCOPY N/A 09/11/2019   Procedure: LAPAROSCOPY DIAGNOSTIC;  Surgeon: Herbert Pun, MD;  Location: [facility] ORS;  Service: General;  Laterality: N/A;  . LAPAROTOMY N/A 09/13/2019   Procedure: REOPENING OF RECENT LAPAROTOMYANASTOMOSIS OF BOWEL;  Surgeon: Herbert Pun, MD;  Location: [facility] ORS;  Service: General;  Laterality: N/A;  . RADIOLOGY WITH ANESTHESIA N/A 11/20/2020   Procedure: IR WITH ANESTHESIA - CODE STROKE;  Surgeon: Radiologist, Medication, MD;  Location: [facility];  Service: Radiology;  Laterality: N/A;  . RIGHT/LEFT HEART CATH AND CORONARY ANGIOGRAPHY Bilateral 02/27/2020   Procedure: RIGHT/LEFT HEART CATH AND CORONARY ANGIOGRAPHY;  Surgeon: Wellington Hampshire, MD;  Location: [place] CV LAB;  Service: Cardiovascular;  Laterality: Bilateral;  . VISCERAL ANGIOGRAPHY N/A 09/12/2019   Procedure: VISCERAL ANGIOGRAPHY;  Surgeon: Algernon Huxley, MD;  Location: [place] [facility];  Service: Cardiovascular;  Laterality: N/A;   HPI:  Pt is a 69 y.o. female admitted with sudden onset of Lt sided weakness and Rt gaze.  CTH showed dense Rt MCA and CTA confimred Rt M1  emergent occlusion.  She underwent revascularizaion 11/30.  PMH includes:  Systolic dysfunction, pulmonary HTN, OSA, obesity, CAD, HTN, COPD, chronic A-FIb, asthma, s/p bowel resection, GERD   Assessment / Plan / Recommendation Clinical Impression   Pt presents with mild-moderate sensory and motor impairments on her L side, resulting in mild anterior spillage and mild-moderate  L buccal pocketing. Pt participated in sensory testing with good localization, but is not aware of these deficits as they pertain to swallowing. Immediate coughing is noted only when pt tried to drink think liquids as a liquid wash to clear solid residue. Otherwise, she completed even a three-ounce water screen with no overt s/s of aspiration. Recommend starting Dys 2 diet and thin liquids. If coughing persists, will plan to proceed with MBS.   SLP Visit Diagnosis: Dysphagia, unspecified (R13.10)    Aspiration Risk  Mild aspiration risk;Moderate aspiration risk    Diet Recommendation Dysphagia 2 (Fine chop);Thin liquid   Liquid Administration via: Cup;Straw Medication Administration: Whole meds with puree Supervision: Patient able to self feed;Full supervision/cueing for compensatory strategies Compensations: Slow rate;Small sips/bites;Lingual sweep for clearance of pocketing;Monitor for anterior loss Postural Changes: Seated upright at 90 degrees    Other  Recommendations Oral Care Recommendations: Oral care BID   Follow up Recommendations Home health SLP;Supervision/Assistance - 24 hour      Frequency and Duration min 2x/week  2 weeks       Prognosis Prognosis for Safe Diet Advancement: Good      Swallow Study   General HPI: Pt is a 69 y.o. female admitted with sudden onset of Lt sided weakness and Rt gaze.  CTH showed dense Rt MCA and CTA confimred Rt M1 emergent occlusion.  She underwent revascularizaion 11/30.  PMH includes:  Systolic dysfunction, pulmonary HTN, OSA, obesity, CAD, HTN, COPD, chronic A-FIb, asthma, s/p bowel resection, GERD Type of Study: Bedside Swallow Evaluation Previous Swallow Assessment: none in chart Diet Prior to this Study: NPO Temperature Spikes Noted: No Respiratory Status: Room air History of Recent Intubation: No Behavior/Cognition: Alert;Cooperative;Pleasant mood;Impulsive Oral Cavity Assessment: Within Functional Limits Oral Care Completed by  SLP: No Oral Cavity - Dentition: Dentures, top;Dentures, bottom (partials) Vision: Functional for self-feeding Self-Feeding Abilities: Able to feed self Patient Positioning: Upright in bed Baseline Vocal Quality: Normal Volitional Cough: Strong Volitional Swallow: Able to elicit    Oral/Motor/Sensory Function Overall Oral Motor/Sensory Function: Moderate impairment Facial ROM: Reduced left;Suspected CN VII (facial) dysfunction Facial Symmetry: Abnormal symmetry left;Suspected CN VII (facial) dysfunction Facial Strength: Reduced left;Suspected CN VII (facial) dysfunction Facial Sensation: Reduced left;Suspected CN V (Trigeminal) dysfunction Lingual ROM: Within Functional Limits Lingual Symmetry: Within Functional Limits Lingual Strength: Within Functional Limits Lingual Sensation: Within Functional Limits Velum: Within Functional Limits Mandible: Within Functional Limits   Ice Chips Ice chips: Within functional limits Presentation: Spoon   Thin Liquid Thin Liquid: Impaired Presentation: Cup;Self Fed;Straw Oral Phase Impairments: Reduced labial seal;Poor awareness of bolus Oral Phase Functional Implications: Left anterior spillage Pharyngeal  Phase Impairments: Cough - Immediate    Nectar Thick Nectar Thick Liquid: Not tested   Honey Thick Honey Thick Liquid: Not tested   Puree Puree: Impaired Presentation: Spoon Oral Phase Impairments: Reduced labial seal;Poor awareness of bolus Oral Phase Functional Implications: Left anterior spillage;Left lateral sulci pocketing   Solid     Solid: Impaired Presentation: Self Fed Oral Phase Impairments: Poor awareness of bolus;Reduced labial seal Oral Phase Functional Implications: Left anterior spillage;Left lateral sulci pocketing Pharyngeal Phase Impairments: Cough - Immediate  Osie Bond., M.A. [place] Pager [phone] Office [phone]  11/21/2020,11:58 AM

## 2020-11-21 NOTE — Evaluation (Addendum)
Speech Language Pathology Evaluation Patient Details Name: Laura [name] MRN: [id number] DOB: 1951/07/12 Today's Date: 11/21/2020 Time: 9326-7124 SLP Time Calculation (min) (ACUTE ONLY): 19 min  Problem List:  Patient Active Problem List   Diagnosis Date Noted  . Acute ischemic stroke ([place]) 11/20/2020  . Middle cerebral artery embolism, right 11/20/2020  . Lymphedema 06/07/2020  . Acute right-sided low back pain with right-sided sciatica 05/21/2020  . Chronic systolic heart failure ([place])   . Pulmonary hypertension, unspecified ([place])   . Nutritional deficiency 01/02/2020  . Memory deficit 01/02/2020  . Encounter for therapeutic drug level monitoring 01/02/2020  . B12 deficiency 01/02/2020  . PAD (peripheral artery disease) ([place]) 12/05/2019  . Intractable vomiting 10/09/2019  . Intestinal ischemia ([place])   . COPD with acute exacerbation ([place])   . Atrial fibrillation with rapid ventricular response ([place]) 09/11/2019  . Enteritis   . Peripheral edema 07/17/2019  . Atherosclerosis of both carotid arteries 07/17/2019  . Varicose veins of right lower extremity with inflammation 06/08/2019  . Gastroesophageal reflux disease with esophagitis 05/08/2019  . Fall at home, initial encounter 03/16/2019  . Contusion of right lower leg 03/16/2019  . Flu-like symptoms 02/03/2019  . Nausea 02/03/2019  . Suprapatellar bursitis of right knee 01/23/2019  . Pain in right leg 01/23/2019  . Cellulitis of right leg 01/23/2019  . Chronic venous stasis dermatitis of right lower extremity 01/23/2019  . Oropharyngeal candidiasis 01/23/2019  . Cellulitis of head except face 12/23/2018  . Screening for breast cancer 08/15/2018  . Chronic bronchitis with acute exacerbation ([place]) 08/15/2018  . Vitamin D deficiency 08/15/2018  . Need for vaccination against Streptococcus pneumoniae using pneumococcal conjugate vaccine 13 08/15/2018  . Obstructive chronic bronchitis without exacerbation ([place]) 07/14/2018  .  Body mass index (BMI) 45.0-49.9, adult 03/17/2018  . Asthma 02/25/2018  . Generalized anxiety disorder 12/16/2017  . Essential hypertension 12/16/2017  . Chronic respiratory failure with hypoxia ([place]) 12/16/2017  . Long term (current) use of anticoagulants 12/16/2017  . Chronic atrial fibrillation ([place]) 12/16/2017  . Other specified functional intestinal disorders 12/16/2017  . Gastro-esophageal reflux disease without esophagitis 12/16/2017  . OSA on CPAP 12/16/2017  . Allergic rhinitis due to animal (cat) (dog) hair and dander 12/16/2017  . Allergic rhinitis due to pollen 12/16/2017  . Severe persistent asthma without complication 58/08/9832  . Cough 12/16/2017  . Shortness of breath 12/16/2017  . Superficial thrombophlebitis of right leg 11/17/2016  . Pain in limb 11/17/2016  . Chronic venous insufficiency 11/17/2016  . Swelling of limb 11/17/2016  . Cellulitis of buttock   . Cellulitis of right axilla   . Sepsis ([place]) 06/28/2016  . Cellulitis and abscess 06/28/2016  . Hypokalemia 06/28/2016  . Acute renal insufficiency 06/28/2016  . Hyperglycemia 06/28/2016  . Leukocytosis 06/28/2016  . Hypoxia 06/28/2016  . Collagenous colitis 05/13/2016   Past Medical History:  Past Medical History:  Diagnosis Date  . Asthma   . Chronic atrial fibrillation ([place])    a. diagnosed in 09/2016; b. failed flecainide and propafenone due to LE swelling and SOB, could not afford Multaq; c. CHADS2VASc => 5 (CHF, HTN, age x 1, nonobs CAD, female)--> Eliquis  . COPD (chronic obstructive pulmonary disease) ([place])   . GERD (gastroesophageal reflux disease)   . HFmrEF (heart failure with mid-range ejection fraction) ([place])    a. 12/2019 Echo: EF 40-45%.  . Hyperlipidemia   . Hypertension   . NICM (nonischemic cardiomyopathy) ([place])    a. 12/2019 Echo: EF 40-45%, glob  HK, mildly reduced RV fxn, sev dil LA. *HR 130 (afib) during study.  . Nonobstructive CAD (coronary artery disease)    a. Lexiscan Myoview  10/2016: no evidence of ischemia, EF 53%; b. 02/2020 Cath: LM nl, LAD 20p, 45m, LCX 20ost, OM1/2/3 nl, LPDA nl, LPL1/2 nl, LPAV nl, RCA small, nl.  . Obesity   . Obstructive sleep apnea   . Pulmonary hypertension ([place])   . Systolic dysfunction    a. TTE 10/2016: EF 50%, mild LVH, moderately dilated LA, moderate MR/TR, mild pulmonary hypertension   Past Surgical History:  Past Surgical History:  Procedure Laterality Date  . BOWEL RESECTION  09/11/2019   Procedure: SMALL BOWEL RESECTION;  Surgeon: Herbert Pun, MD;  Location: [facility] ORS;  Service: General;;  . CARDIAC CATHETERIZATION    . cataract surgery    . COLONOSCOPY WITH PROPOFOL N/A 03/19/2020   Procedure: COLONOSCOPY WITH PROPOFOL;  Surgeon: [name], MD;  Location: [facility] ENDOSCOPY;  Service: Gastroenterology;  Laterality: N/A;  . CORONARY ANGIOPLASTY    . INCISION AND DRAINAGE ABSCESS Right 06/29/2016   Procedure: INCISION AND DRAINAGE ABSCESS;  Surgeon: Florene Glen, MD;  Location: [facility] ORS;  Service: General;  Laterality: Right;  . INCISION AND DRAINAGE OF WOUND Left 06/29/2016   Procedure: IRRIGATION AND DEBRIDEMENT WOUND;  Surgeon: Florene Glen, MD;  Location: [facility] ORS;  Service: General;  Laterality: Left;  . LAPAROSCOPIC RIGHT COLECTOMY  09/11/2019   Procedure: RIGHT COLECTOMY;  Surgeon: Herbert Pun, MD;  Location: [facility] ORS;  Service: General;;  . LAPAROSCOPY N/A 09/11/2019   Procedure: LAPAROSCOPY DIAGNOSTIC;  Surgeon: Herbert Pun, MD;  Location: [facility] ORS;  Service: General;  Laterality: N/A;  . LAPAROTOMY N/A 09/13/2019   Procedure: REOPENING OF RECENT LAPAROTOMYANASTOMOSIS OF BOWEL;  Surgeon: Herbert Pun, MD;  Location: [facility] ORS;  Service: General;  Laterality: N/A;  . RADIOLOGY WITH ANESTHESIA N/A 11/20/2020   Procedure: IR WITH ANESTHESIA - CODE STROKE;  Surgeon: Radiologist, Medication, MD;  Location: [facility];  Service: Radiology;  Laterality: N/A;  . RIGHT/LEFT HEART CATH AND  CORONARY ANGIOGRAPHY Bilateral 02/27/2020   Procedure: RIGHT/LEFT HEART CATH AND CORONARY ANGIOGRAPHY;  Surgeon: Wellington Hampshire, MD;  Location: [facility] LAB;  Service: Cardiovascular;  Laterality: Bilateral;  . VISCERAL ANGIOGRAPHY N/A 09/12/2019   Procedure: VISCERAL ANGIOGRAPHY;  Surgeon: Algernon Huxley, MD;  Location: [place] CV LAB;  Service: Cardiovascular;  Laterality: N/A;   HPI:  Pt is a 69 y.o. female admitted with sudden onset of Lt sided weakness and Rt gaze.  CTH showed dense Rt MCA and CTA confimred Rt M1 emergent occlusion.  She underwent revascularizaion 11/30.  PMH includes:  Systolic dysfunction, pulmonary HTN, OSA, obesity, CAD, HTN, COPD, chronic A-FIb, asthma, s/p bowel resection, GERD   Assessment / Plan / Recommendation Clinical Impression  Pt scored 29/30 on the SLUMS, but functionally she presents with reduced awareness (particularly to her L side), some impulsivity, and mild dysarthria. She is very independent at baseline and would benefit from SLP f/u to address cognition, safety, and communication. Recommend SLP f/u and 24/7 supervision.    SLP Assessment  SLP Recommendation/Assessment: Patient needs continued Speech Lanaguage Pathology Services SLP Visit Diagnosis: Cognitive communication deficit (R41.841);Dysarthria and anarthria (R47.1)    Follow Up Recommendations  Home health SLP;Supervision/Assistance - 24 hour     Frequency and Duration min 2x/week  2 weeks      SLP Evaluation Cognition  Overall Cognitive Status: Impaired/Different from baseline Arousal/Alertness: Awake/alert Orientation Level:  Oriented X4 Attention: Sustained Sustained Attention: Appears intact Memory: Appears intact Awareness: Impaired Awareness Impairment: Intellectual impairment;Emergent impairment;Anticipatory impairment Problem Solving: Appears intact Executive Function: Self Monitoring Self Monitoring: Impaired Self Monitoring Impairment: Verbal  complex Behaviors: Impulsive       Comprehension  Auditory Comprehension Overall Auditory Comprehension: Appears within functional limits for tasks assessed    Expression Expression Primary Mode of Expression: Verbal Verbal Expression Overall Verbal Expression: Appears within functional limits for tasks assessed   Oral / Motor  Oral Motor/Sensory Function Overall Oral Motor/Sensory Function: Moderate impairment Facial ROM: Reduced left;Suspected CN VII (facial) dysfunction Facial Symmetry: Abnormal symmetry left;Suspected CN VII (facial) dysfunction Facial Strength: Reduced left;Suspected CN VII (facial) dysfunction Facial Sensation: Reduced left;Suspected CN V (Trigeminal) dysfunction Lingual ROM: Within Functional Limits Lingual Symmetry: Within Functional Limits Lingual Strength: Within Functional Limits Lingual Sensation: Within Functional Limits Velum: Within Functional Limits Mandible: Within Functional Limits Motor Speech Overall Motor Speech: Impaired Respiration: Within functional limits Phonation: Normal Resonance: Within functional limits Articulation: Impaired Level of Impairment: Conversation Intelligibility: Intelligibility reduced Conversation: 75-100% accurate   GO                    Osie Bond., M.A. [place] Acute Rehabilitation Services Pager [phone] Office [phone]  11/21/2020, 1:30 PM

## 2020-11-21 NOTE — Progress Notes (Addendum)
Physical Therapy Evaluation Patient Details Name: Laura [name] MRN: [id number]  DOB: [birth year] Today's Date: 11/21/2020   History of Present Illness  This 69 y.o. female admitted with sudden onset of Lt sided weakness, and Right gaze.  CTA of head showed dense Rt MCA and CTA confimred Rt M1 emergent occlusion.  She underwent revascularizaion 11/30.  PMH includes:  Systolic dysfunction, pulmonary HTN, OSA, obesity, CAD, HTN, COPD, chronic A-FIb, asthma, s/p bowel resection   Clinical Impression  Pt admitted with/for sudden onset of L side weakness/right gaze and was successfully revascularized.  Pt still needing moderate or more assist for mobility and standing activity..  Pt currently limited functionally due to the problems listed below.  (see problems list.)  Pt will benefit from PT to maximize function and safety to be able to get home safely with available assist.     Follow Up Recommendations Home health PT;Supervision/Assistance - 24 hour    Equipment Recommendations  Wheelchair (measurements PT);Wheelchair cushion (measurements PT)    Recommendations for Other Services       Precautions / Restrictions Precautions Precautions: Fall      Mobility  Bed Mobility Overal bed mobility: Needs Assistance Bed Mobility: Supine to Sit;Sit to Supine     Supine to sit: Min guard Sit to supine: Mod assist   General bed mobility comments: pt bridged to EOB and came up from mildly raised HOB without assist.  Needed mod LE assist to get into bed.    Transfers Overall transfer level: Needs assistance Equipment used: 2 person hand held assist;1 person hand held assist Transfers: Sit to/from Stand Sit to Stand: Min assist;+2 physical assistance         General transfer comment: assist forward with more stability and boost.  Ambulation/Gait Ambulation/Gait assistance: Mod assist;+2 safety/equipment Gait Distance (Feet): 3 Feet   Gait Pattern/deviations: Step-to pattern      General Gait Details: poor w/shift and advancing step toward [facility]  Stairs            Wheelchair Mobility    Modified Rankin (Stroke Patients Only) Modified Rankin (Stroke Patients Only) Pre-Morbid Rankin Score: No significant disability Modified Rankin: Moderately severe disability     Balance Overall balance assessment: Needs assistance Sitting-balance support: Feet supported Sitting balance-Leahy Scale: Fair     Standing balance support: Single extremity supported;No upper extremity supported;During functional activity Standing balance-Leahy Scale: Poor Standing balance comment: moderately heavy list L, reliant on external support                             Pertinent Vitals/Pain Pain Assessment: Faces Faces Pain Scale: No hurt    Home Living Family/patient expects to be discharged to:: Private residence Living Arrangements: Spouse/significant other;Other relatives Available Help at Discharge: Family;Available 24 hours/day Type of Home: House Home Access: Stairs to enter;Other (comment) (Family/pt urged to start working on a ramped entrance) Entrance Stairs-Rails: Psychiatric nurse of Steps: 3 Home Layout: One level Home Equipment: Electric scooter;Cane - quad;Wheelchair - manual;Grab bars - toilet;Grab bars - tub/shower;Bedside commode;Other (comment) (lift chair   Simultaneous filing. User may not have seen previous data.) Additional Comments: Family is very supportive and reports they are able to provide necessary level of assist 24/7    Prior Function Level of Independence: Independent;Needs assistance (Simultaneous filing. User may not have seen previous data.)   Gait / Transfers Assistance Needed: ambulated without assistive device   ADL's / Homemaking Assistance Needed:  spouse assisted intermittently with LB ADLs         Hand Dominance        Extremity/Trunk Assessment   Upper Extremity Assessment Upper Extremity  Assessment: LUE deficits/detail LUE Deficits / Details: Pt demonstratres movement consistent with Brunnstrom end stage 3/beginning stage 4.  Moving out of synergy.  She is only able to lift shoulder to ~60*.  Demonstrates gross grasp and release of hand.  Edema noted   AAROM WFL.   LUE Coordination: decreased fine motor;decreased gross motor    Lower Extremity Assessment Lower Extremity Assessment: Defer to PT evaluation LLE Deficits / Details: functional strength to MMt, but incoordination L LE with decreased balance in stance LLE Coordination: decreased fine motor    Cervical / Trunk Assessment Cervical / Trunk Assessment: Kyphotic  Communication   Communication: No difficulties  Cognition Arousal/Alertness: Awake/alert Behavior During Therapy: WFL for tasks assessed/performed Overall Cognitive Status: Impaired/Different from baseline Area of Impairment: Attention;Awareness                           Awareness: Emergent   General Comments: not tested formally      General Comments General comments (skin integrity, edema, etc.): Low 100's at rest, up to 150's in afib with activity.  SpO2 mid/upper 90's    Exercises     Assessment/Plan    PT Assessment Patient needs continued PT services  PT Problem List Decreased strength;Decreased activity tolerance;Decreased balance;Decreased mobility;Decreased coordination;Decreased safety awareness       PT Treatment Interventions DME instruction;Functional mobility training;Therapeutic activities;Balance training;Neuromuscular re-education;Patient/family education;Other (comment);Gait training (pre gait)    PT Goals (Current goals can be found in the Care Plan section)  Acute Rehab PT Goals Patient Stated Goal: go straight home without (residential) rehab PT Goal Formulation: With patient Time For Goal Achievement: 12/05/20 Potential to Achieve Goals: Good    Frequency Min 3X/week   Barriers to discharge         Co-evaluation PT/OT/SLP Co-Evaluation/Treatment: Yes Reason for Co-Treatment: To address functional/ADL transfers;For patient/therapist safety   OT goals addressed during session: ADL's and self-care       AM-PAC PT "6 Clicks" Mobility  Outcome Measure Help needed turning from your back to your side while in a flat bed without using bedrails?: A Little Help needed moving from lying on your back to sitting on the side of a flat bed without using bedrails?: A Lot Help needed moving to and from a bed to a chair (including a wheelchair)?: A Lot Help needed standing up from a chair using your arms (e.g., wheelchair or bedside chair)?: A Lot Help needed to walk in hospital room?: Total Help needed climbing 3-5 steps with a railing? : Total 6 Click Score: 11    End of Session   Activity Tolerance: Patient tolerated treatment well Patient left: in bed;with call bell/phone within reach;with bed alarm set;with family/visitor present Nurse Communication: Mobility status PT Visit Diagnosis: Other abnormalities of gait and mobility (R26.89);Hemiplegia and hemiparesis Hemiplegia - Right/Left: Left Hemiplegia - dominant/non-dominant: Non-dominant Hemiplegia - caused by: Cerebral infarction    Time: 1025-8527 PT Time Calculation (min) (ACUTE ONLY): 33 min   Charges:   PT Evaluation $PT Eval Moderate Complexity: 1 Mod          11/21/2020  Laura Carne., PT Acute Rehabilitation Services [phone]  (pager) [phone]  (office)  Laura [name] 11/21/2020, 1:18 PM

## 2020-11-21 NOTE — Progress Notes (Signed)
°  Echocardiogram 2D Echocardiogram has been performed.  Laura [name] 11/21/2020, 3:38 PM

## 2020-11-21 NOTE — Evaluation (Signed)
Occupational Therapy Evaluation Patient Details Name: Laura [name] MRN: [id number] DOB: 03-Sep-1951 Today's Date: 11/21/2020    History of Present Illness This 69 y.o. female admitted with sudden onset of Lt sided weakness, and Right gaze.  CTA of head showed dense Rt MCA and CTA confimred Rt M1 emergent occlusion.  She underwent revascularizaion 11/30.  PMH includes:  Systolic dysfunction, pulmonary HTN, OSA, obesity, CAD, HTN, COPD, chronic A-FIb, asthma, s/p bowel resection    Clinical Impression   Pt admitted with above. She demonstrates the below listed deficits and will benefit from continued OT to maximize safety and independence with BADLs.  Pt presents to OT with Lt hemiparesis, Lt inattention, decreased activity tolerance, impaired balance.  She currently requires set up assist - total A for ADLs and min A +2 for static standing.  Mod A to side step up the EOB.  She fatigues very quickly with activity with HR up to 163 (A-Fib).  She lives with her spouse and required intermittent assist for ADLs.  Discussed recommendation for CIR with pt and daughter, but pt adamantly refusing CIR wanting to discharge home.  Daughter confirms she will have the necessary level of physical assist needed 24/7.  Will follow.       Follow Up Recommendations  Home health OT;Supervision/Assistance - 24 hour    Equipment Recommendations  Wheelchair (measurements OT)    Recommendations for Other Services       Precautions / Restrictions Precautions Precautions: Fall      Mobility Bed Mobility Overal bed mobility: Needs Assistance Bed Mobility: Supine to Sit;Sit to Supine     Supine to sit: Min guard Sit to supine: Mod assist   General bed mobility comments: pt bridged to EOB and came up from mildly raised HOB without assist.  Needed mod LE assist to get into bed.    Transfers Overall transfer level: Needs assistance Equipment used: 2 person hand held assist;1 person hand held  assist Transfers: Sit to/from Stand Sit to Stand: Min assist;+2 physical assistance         General transfer comment: assist forward with more stability and boost.    Balance Overall balance assessment: Needs assistance Sitting-balance support: Feet supported Sitting balance-Leahy Scale: Fair     Standing balance support: Single extremity supported;No upper extremity supported;During functional activity Standing balance-Leahy Scale: Poor Standing balance comment: moderately heavy list L, reliant on external support                           ADL either performed or assessed with clinical judgement   ADL Overall ADL's : Needs assistance/impaired Eating/Feeding: Set up;Bed level   Grooming: Wash/dry hands;Wash/dry face;Oral care;Brushing hair;Sitting;Minimal assistance Grooming Details (indicate cue type and reason): due to endurance and impaired balance  Upper Body Bathing: Moderate assistance;Sitting;Bed level   Lower Body Bathing: Maximal assistance;Sit to/from stand   Upper Body Dressing : Moderate assistance;Sitting   Lower Body Dressing: Total assistance;Sit to/from stand   Toilet Transfer: Total assistance Toilet Transfer Details (indicate cue type and reason): unable  Toileting- Clothing Manipulation and Hygiene: Total assistance;Sit to/from stand       Functional mobility during ADLs: Moderate assistance (sit to stand only ) General ADL Comments: Pt fatigues quickly with ADLs      Vision Baseline Vision/History: Wears glasses Wears Glasses: At all times Patient Visual Report: No change from baseline Vision Assessment?: Yes Eye Alignment: Within Functional Limits Ocular Range of Motion: Within Functional  Limits Alignment/Gaze Preference: Within Defined Limits Tracking/Visual Pursuits: Able to track stimulus in all quads without difficulty Visual Fields: Left visual field deficit Additional Comments: Mild left superior field deficit noted       Perception Perception Perception Tested?: Yes Perception Deficits: Inattention/neglect Inattention/Neglect: Does not attend to left side of body;Does not attend to left visual field Spatial deficits: mild Lt inattention    Praxis Praxis Praxis tested?: Within functional limits    Pertinent Vitals/Pain Pain Assessment: Faces Faces Pain Scale: No hurt     Hand Dominance     Extremity/Trunk Assessment Upper Extremity Assessment Upper Extremity Assessment: LUE deficits/detail LUE Deficits / Details: Pt demonstratres movement consistent with Brunnstrom end stage 3/beginning stage 4.  Moving out of synergy.  She is only able to lift shoulder to ~60*.  Demonstrates gross grasp and release of hand.  Edema noted   AAROM WFL.   LUE Coordination: decreased fine motor;decreased gross motor   Lower Extremity Assessment Lower Extremity Assessment: Defer to PT evaluation LLE Deficits / Details: functional strength to MMt, but incoordination L LE with decreased balance in stance LLE Coordination: decreased fine motor   Cervical / Trunk Assessment Cervical / Trunk Assessment: Kyphotic   Communication Communication Communication: No difficulties   Cognition Arousal/Alertness: Awake/alert Behavior During Therapy: WFL for tasks assessed/performed Overall Cognitive Status: Impaired/Different from baseline Area of Impairment: Attention;Awareness                           Awareness: Emergent   General Comments: not tested formally   General Comments  Discussed recommendation for CIR with pt and daughter, however, pt is adamant that she wants to discharge straight home.  Discussed realistic amount of assistance she will need at discharge.  Pt reports her spouse and family can provide that level of assist, and daughter confirms.  She does not have a ramp, but reports her spouse can build one.  Reviewed ramp specs with pt and daughter and encouraged them to have spouse start on ramp  ASAP. HR Low 100's at rest, up to 150's in afib with activity.  SpO2 mid/upper 90's    Exercises     Shoulder Instructions      Home Living Family/patient expects to be discharged to:: Private residence Living Arrangements: Spouse/significant other;Other relatives Available Help at Discharge: Family;Available 24 hours/day Type of Home: House Home Access: Stairs to enter;Other (comment) (Family/pt urged to start working on a ramped entrance) Technical brewer of Steps: 3 Entrance Stairs-Rails: Right;Left Home Layout: One level     Bathroom Shower/Tub: Walk-in shower;Other (comment) (walk in tub)   Bathroom Toilet: Handicapped height     Home Equipment: Electric scooter;Cane - quad;Wheelchair - manual;Grab bars - toilet;Grab bars - tub/shower;Bedside commode;Other (comment) (lift chair   Simultaneous filing. User may not have seen previous data.)   Additional Comments: Family is very supportive and reports they are able to provide necessary level of assist 24/7  Lives With: Spouse;Family    Prior Functioning/Environment Level of Independence: Independent;Needs assistance (Simultaneous filing. User may not have seen previous data.)  Gait / Transfers Assistance Needed: ambulated without assistive device  ADL's / Homemaking Assistance Needed: spouse assisted intermittently with LB ADLs             OT Problem List: Decreased strength;Decreased activity tolerance;Impaired balance (sitting and/or standing);Decreased coordination;Decreased cognition;Impaired vision/perception;Cardiopulmonary status limiting activity;Impaired sensation;Impaired UE functional use;Increased edema;Decreased safety awareness      OT Treatment/Interventions: Self-care/ADL training;Neuromuscular  education;DME and/or AE instruction;Manual therapy;Therapeutic activities;Cognitive remediation/compensation;Visual/perceptual remediation/compensation;Patient/family education;Balance training    OT  Goals(Current goals can be found in the care plan section) Acute Rehab OT Goals Patient Stated Goal: To go home with family assistance  OT Goal Formulation: With patient/family Time For Goal Achievement: 12/05/20 Potential to Achieve Goals: Good ADL Goals Pt Will Perform Grooming: with supervision;with set-up;sitting Pt Will Perform Upper Body Bathing: with set-up;with supervision;sitting Pt Will Perform Lower Body Bathing: with mod assist;sit to/from stand;with adaptive equipment Pt Will Perform Upper Body Dressing: with min assist;sitting Pt Will Perform Lower Body Dressing: with mod assist;sit to/from stand;with adaptive equipment Pt Will Transfer to Toilet: with mod assist;stand pivot transfer;bedside commode Pt Will Perform Toileting - Clothing Manipulation and hygiene: with mod assist;sit to/from stand Pt/caregiver will Perform Home Exercise Program: Increased ROM;Increased strength;Left upper extremity;With Supervision;With written HEP provided Additional ADL Goal #1: family will independently assist pt with functional transfers to [facility].  OT Frequency: Min 2X/week   Barriers to D/C:            Co-evaluation PT/OT/SLP Co-Evaluation/Treatment: Yes Reason for Co-Treatment: To address functional/ADL transfers;For patient/therapist safety   OT goals addressed during session: ADL's and self-care      AM-PAC OT "6 Clicks" Daily Activity     Outcome Measure Help from another person eating meals?: A Little Help from another person taking care of personal grooming?: A Little Help from another person toileting, which includes using toliet, bedpan, or urinal?: A Lot Help from another person bathing (including washing, rinsing, drying)?: A Lot Help from another person to put on and taking off regular upper body clothing?: A Lot Help from another person to put on and taking off regular lower body clothing?: Total 6 Click Score: 13   End of Session Nurse Communication: Mobility  status  Activity Tolerance: Patient limited by fatigue Patient left: in bed;with call bell/phone within reach  OT Visit Diagnosis: Unsteadiness on feet (R26.81);Hemiplegia and hemiparesis Hemiplegia - Right/Left: Left Hemiplegia - dominant/non-dominant: Non-Dominant Hemiplegia - caused by: Cerebral infarction                Time: 3291-9166 OT Time Calculation (min): 23 min Charges:  OT General Charges $OT Visit: 1 Visit OT Evaluation $OT Eval Moderate Complexity: 1 Mod  Nilsa Nutting., OTR/L Acute Rehabilitation Services Pager [phone] Office [phone]   Lucille Passy M 11/21/2020, 1:27 PM

## 2020-11-21 NOTE — Progress Notes (Signed)
STROKE TEAM PROGRESS NOTE   INTERVAL HISTORY Daughter and PT/OT and RN are at the bed side. Pt lying in bed, awake alert and orientated. Still has left facial droop and left sided hemiparesis, but no gaze deviation or neglect. MRI showed right MCA patchy infarcts and right MCA patent. Still has wheezing, hx of COPD, will resume breathing treatment PRN. D/c IVF.   Per pt, she was off eliquis last Sunday 11/20 to Wednesday 11/24 due to lumbar injection. Reported restarted on Thursday 11/25. However, during that period, pt had a couple of episodes of left hand weakness. Since 11/25, although restarted eliquis, pt continued to have some episodic HA and confusion. Yesterday, she had full blown of right MCA syndrome leading to thrombectomy.  Vitals:   11/21/20 0800 11/21/20 0805 11/21/20 0900 11/21/20 1000  BP: 135/78  134/67 138/75  Pulse: (!) 55  98 (!) 49  Resp: 13  15 13   Temp: 98.2 F (36.8 C)     TempSrc: Oral     SpO2: 99% 98% 98% 94%  Weight:      Height:       CBC:  Recent Labs  Lab 11/20/20 1135 11/20/20 1135 11/20/20 1140 11/21/20 0500  WBC 11.5*  --   --  9.7  NEUTROABS 7.0  --   --  5.5  HGB 15.0   < > 15.3* 13.1  HCT 49.0*   < > 45.0 40.7  MCV 96.8  --   --  92.1  PLT 395  --   --  357   < > = values in this interval not displayed.   Basic Metabolic Panel:  Recent Labs  Lab 11/20/20 1135 11/20/20 1135 11/20/20 1140 11/21/20 0500  NA 136   < > 138 140  K 4.0   < > 3.7 3.7  CL 103   < > 107 106  CO2 21*  --   --  22  GLUCOSE 153*   < > 148* 114*  BUN 16   < > 19 20  CREATININE 1.06*   < > 0.90 0.93  CALCIUM 8.9  --   --  8.8*   < > = values in this interval not displayed.   Lipid Panel:  Recent Labs  Lab 11/21/20 0616  CHOL 151  TRIG 125  HDL 46  CHOLHDL 3.3  VLDL 25  LDLCALC 80   HgbA1c:  Recent Labs  Lab 11/21/20 0616  HGBA1C 6.2*   Urine Drug Screen: No results for input(s): LABOPIA, COCAINSCRNUR, LABBENZ, AMPHETMU, THCU, LABBARB in the  last 168 hours.  Alcohol Level No results for input(s): ETH in the last 168 hours.  IMAGING past 24 hours CT Code Stroke CTA Head W/WO contrast  Result Date: 11/20/2020 CLINICAL DATA:  Right MCA syndrome. EXAM: CT ANGIOGRAPHY HEAD AND NECK TECHNIQUE: Multidetector CT imaging of the head and neck was performed using the standard protocol during bolus administration of intravenous contrast. Multiplanar CT image reconstructions and MIPs were obtained to evaluate the vascular anatomy. Carotid stenosis measurements (when applicable) are obtained utilizing NASCET criteria, using the distal internal carotid diameter as the denominator. CONTRAST:  Dose is currently not known COMPARISON:  Noncontrast head CT from earlier today FINDINGS: CTA NECK FINDINGS Aortic arch: Atheromatous plaque.  Four vessel branching. Right carotid system: No significant stenosis or atherosclerosis. Negative for dissection or beading Left carotid system: No significant atherosclerosis and no stenosis or beading. Vertebral arteries: Calcified plaque at the left subclavian origin with 55% stenosis  based on sagittal reformats. The left vertebral artery arises from the arch near the subclavian origin and is widely patent. The right vertebral artery is tortuous but widely patent. Skeleton: Unremarkable Other neck: Bilateral cataract resection. Upper chest: Negative Review of the MIP images confirms the above findings CTA HEAD FINDINGS Anterior circulation: Abrupt right M1 cut off consistent with acute thrombosis. Calcified plaque along the bilateral carotid siphons without significant stenosis. Negative for aneurysm. No contralateral embolism is seen. Posterior circulation: The vertebral and basilar arteries are widely patent. No branch occlusion, beading, or aneurysm. Venous sinuses: Not assessed on this arterial study Anatomic variants: None significant Review of the MIP images confirms the above findings Critical Value/emergent results were  called by telephone at the time of interpretation on 11/20/2020 at 11:52 am to provider [facility] , who verbally acknowledged these results. IMPRESSION: 1. Emergent large vessel occlusion at the right M1 segment. 2. No underlying flow limiting stenosis or visible embolic source. Carotid atherosclerosis is mild for age. 3. 55% left subclavian artery origin stenosis. Electronically Signed   By: Monte Fantasia M.D.   On: 11/20/2020 11:59   CT Code Stroke CTA Neck W/WO contrast  Result Date: 11/20/2020 CLINICAL DATA:  Right MCA syndrome. EXAM: CT ANGIOGRAPHY HEAD AND NECK TECHNIQUE: Multidetector CT imaging of the head and neck was performed using the standard protocol during bolus administration of intravenous contrast. Multiplanar CT image reconstructions and MIPs were obtained to evaluate the vascular anatomy. Carotid stenosis measurements (when applicable) are obtained utilizing NASCET criteria, using the distal internal carotid diameter as the denominator. CONTRAST:  Dose is currently not known COMPARISON:  Noncontrast head CT from earlier today FINDINGS: CTA NECK FINDINGS Aortic arch: Atheromatous plaque.  Four vessel branching. Right carotid system: No significant stenosis or atherosclerosis. Negative for dissection or beading Left carotid system: No significant atherosclerosis and no stenosis or beading. Vertebral arteries: Calcified plaque at the left subclavian origin with 55% stenosis based on sagittal reformats. The left vertebral artery arises from the arch near the subclavian origin and is widely patent. The right vertebral artery is tortuous but widely patent. Skeleton: Unremarkable Other neck: Bilateral cataract resection. Upper chest: Negative Review of the MIP images confirms the above findings CTA HEAD FINDINGS Anterior circulation: Abrupt right M1 cut off consistent with acute thrombosis. Calcified plaque along the bilateral carotid siphons without significant stenosis. Negative for  aneurysm. No contralateral embolism is seen. Posterior circulation: The vertebral and basilar arteries are widely patent. No branch occlusion, beading, or aneurysm. Venous sinuses: Not assessed on this arterial study Anatomic variants: None significant Review of the MIP images confirms the above findings Critical Value/emergent results were called by telephone at the time of interpretation on 11/20/2020 at 11:52 am to provider [facility] , who verbally acknowledged these results. IMPRESSION: 1. Emergent large vessel occlusion at the right M1 segment. 2. No underlying flow limiting stenosis or visible embolic source. Carotid atherosclerosis is mild for age. 3. 55% left subclavian artery origin stenosis. Electronically Signed   By: Monte Fantasia M.D.   On: 11/20/2020 11:59   CT HEAD CODE STROKE WO CONTRAST  Result Date: 11/20/2020 CLINICAL DATA:  Code stroke.  Left-sided weakness EXAM: CT HEAD WITHOUT CONTRAST TECHNIQUE: Contiguous axial images were obtained from the base of the skull through the vertex without intravenous contrast. COMPARISON:  September 2020 FINDINGS: Brain: No acute intracranial hemorrhage or mass effect. No loss of gray-white differentiation. Ventricles and sulci are within normal limits in size and configuration no  extra-axial collection. Vascular: Hyperdensity of the right M1 MCA likely reflecting thrombus. There is intracranial atherosclerotic calcification at the skull base. Skull: Unremarkable. Sinuses/Orbits: No acute abnormality. Other: Mastoid air cells are clear. ASPECTS [facility] Stroke Program Early CT Score) - Ganglionic level infarction (caudate, lentiform nuclei, internal capsule, insula, M1-M3 cortex): 7 - Supraganglionic infarction (M4-M6 cortex): 3 Total score (0-10 with 10 being normal): 10 IMPRESSION: 1. No acute intracranial hemorrhage. 2. Dense right M1 MCA likely reflecting thrombus. 3. ASPECTS is 10. These results were called by telephone at the time of  interpretation on 11/20/2020 at 11:55 am to provider Dr. Rory Percy, who verbally acknowledged these results. Electronically Signed   By: Macy Mis M.D.   On: 11/20/2020 12:00    PHYSICAL EXAM  Temp:  [97.8 F (36.6 C)-98.6 F (37 C)] 97.8 F (36.6 C) (12/01 1140) Pulse Rate:  [34-157] 49 (12/01 1000) Resp:  [12-25] 13 (12/01 1000) BP: (84-173)/(44-136) 138/75 (12/01 1000) SpO2:  [91 %-100 %] 94 % (12/01 1000) Arterial Line BP: (103-163)/(37-86) 136/72 (12/01 0800)  General - Well nourished, well developed, in no apparent distress.  Ophthalmologic - fundi not visualized due to noncooperation.  Cardiovascular - irregularly irregular heart rate and rhythm  Mental Status -  Level of arousal and orientation to time, place, and person were intact. Language including expression, naming, repetition, comprehension was assessed and found intact. Fund of Knowledge was assessed and was intact.  Cranial Nerves II - XII - II - Visual field intact OU. III, IV, VI - Extraocular movements intact. V - Facial sensation intact bilaterally. VII - left lower facial weakness. VIII - Hearing & vestibular intact bilaterally. X - Palate elevates symmetrically. XI - Chin turning & shoulder shrug intact bilaterally. XII - Tongue protrusion intact.  Motor Strength - The patient's strength was normal in right UE and LE, however, LUE proximal 3-/5, bicep 3/5 and tricep 3/5 with finger grip 3-/5. LLE 5-/5 proximal and distal.  Bulk was normal and fasciculations were absent.   Motor Tone - Muscle tone was assessed at the neck and appendages and was normal.  Reflexes - The patient's reflexes were symmetrical in all extremities and she had no pathological reflexes.  Sensory - Light touch, temperature/pinprick were assessed and were symmetrical.    Coordination - The patient had normal movements in the hands with no ataxia or dysmetria although left FTN is very slow and hard to complete but no ataxia.   Tremor was absent.  Gait and Station - deferred.   ASSESSMENT/PLAN Ms. BETHA SHADIX is a 69 y.o. female with history of chronic atrial fibrillation on Eliquis, COPD, gastroesophageal reflux disease, heart failure with midrange ejection fraction, hypertension, hyperlipidemia, nonischemic cardiomyopathy, obstructive sleep apnea, obesity, presenting with L sided weakness w/ R gaze preference, slurred speech and L facial droop. No tPA as on Eliquis. Found to have R  MCA occlusion. Sent to IR.   Stroke:  R MCA infarct due to right M1 occlusion s/p IR w/ TICI3 revascularization, infarct embolic secondary to known AF   Code Stroke CT head No acute abnormality. Dense R M1. ASPECTS 10.     CTA head & neck ELVO R M1. Mild ICA atherosclerosis. L subclavian origin 55% stenosis.  Cerebral angio / IR TICI3 revascularization R MCA occlusion   Post IR CT small SAH post perisylvian fissure w/ contrast stain R parietal subcortical region  MRI pending  MRA pending  2D Echo EF 45 to 50%  LDL 80  HgbA1c 6.2  VTE prophylaxis - SCDs   Eliquis (apixaban) daily prior to admission, now on aspirin 325.  Will consider resume Eliquis based on the size of infarct.  Therapy recommendations:  pending   Disposition:  pending   Atrial Fibrillation, chronic  Home anticoagulation:  Eliquis (apixaban) daily   was off eliquis last Sunday 11/20 to Wednesday 11/24 due to lumbar injection. Reported restarted on Thursday 11/25. However, during the period off eliquis, pt had a couple of episodes of left hand weakness. Since 11/25, although restarted eliquis, pt continued to have some episodic HA and confusion.  . Now on aspirin 325 . Will consider resume Eliquis based on size of infarct   Hypertension  Home meds:  Digoxin 0.125, diltiazem CD 360, lasix 20 bid, losartan 25  Stable  On cleviprex, wean off as able . BP goal 120-140 within 24h following IR procedure  . Long-term BP goal  normotensive  Hyperlipidemia  Home meds:  crestor 20  Resume Crestor 20  LDL 80, goal < 70  Continue statin at discharge  Dysphagia . Secondary to stroke . NPO -> dys 2 thin . Speech on board   Other Stroke Risk Factors  Advanced Age >/= 42   Morbid Obesity, Body mass index is 45.49 kg/m., BMI >/= 30 associated with increased stroke risk, recommend weight loss, diet and exercise as appropriate   Obstructive sleep apnea, on CPAP at home  Coronary Artery Disease  Systolic Congestive heart failure (HFmrEF)  NICM, EF 40-45%  Hx DVT  Other Active Problems  Anxiety On xanax 0.5 bid PTA. Resumed  COPD w/o exacerbation -> off IVF, resume breathing treatment as needed  GERD  Hospital day # 1  This patient is critically ill due to right MCA stroke, right MCA occlusion status post thrombectomy, chronic A. fib, cardiomyopathy and at significant risk of neurological worsening, death form recurrent stroke, hemorrhagic conversion, heart failure, seizure. This patient's care requires constant monitoring of vital signs, hemodynamics, respiratory and cardiac monitoring, review of multiple databases, neurological assessment, discussion with family, other specialists and medical decision making of high complexity. I spent 35 minutes of neurocritical care time in the care of this patient. I had long discussion with patient and daughter at bedside, updated pt current condition, treatment plan and potential prognosis, and answered all the questions.  They expressed understanding and appreciation.    Rosalin Hawking, MD PhD Stroke Neurology 11/21/2020 6:59 PM  To contact Stroke Continuity provider, please refer to [email or web address] After hours, contact General Neurology

## 2020-11-22 ENCOUNTER — Inpatient Hospital Stay (HOSPITAL_COMMUNITY): Payer: Medicare Other

## 2020-11-22 LAB — GLUCOSE, CAPILLARY: Glucose-Capillary: 117 mg/dL — ABNORMAL HIGH (ref 70–99)

## 2020-11-22 NOTE — Progress Notes (Signed)
  Speech Language Pathology Treatment: Dysphagia  Patient Details Name: Laura [name] MRN: [id number] DOB: [birth year] Today's Date: 11/22/2020 Time: 3546-5681 SLP Time Calculation (min) (ACUTE ONLY): 13 min  Assessment / Plan / Recommendation Clinical Impression  Pt was seen during breakfast meal, verbalizing adequate recall of compensatory strategies from previous date. Even though she is aware of these strategies, she has some difficulty implementing them due to her reduced sensation and awareness. Mild anterior loss and pocketing are noted on the L side. Pt seems to stifle a cough when cued to take larger sips from a straw, and then shares that "that's what makes me cough," referring to larger sips. Given persistent dysphagia and considering her potential d/c home for ongoing therapies, recommend proceeding with MBS to better evaluate oropharyngeal swallowing and make the most appropriate treatment plan. Pt in agreement - test tentatively scheduled for this afternoon.    HPI HPI: Pt is a 69 y.o. female admitted with sudden onset of Lt sided weakness and Rt gaze.  CTH showed dense Rt MCA and CTA confimred Rt M1 emergent occlusion.  She underwent revascularizaion 11/30.  PMH includes:  Systolic dysfunction, pulmonary HTN, OSA, obesity, CAD, HTN, COPD, chronic A-FIb, asthma, s/p bowel resection, GERD      SLP Plan  MBS       Recommendations  Diet recommendations: Dysphagia 2 (fine chop);Thin liquid Liquids provided via: Straw;Cup Medication Administration: Whole meds with puree Supervision: Staff to assist with self feeding Compensations: Slow rate;Small sips/bites;Lingual sweep for clearance of pocketing;Monitor for anterior loss Postural Changes and/or Swallow Maneuvers: Seated upright 90 degrees;Upright 30-60 min after meal                Oral Care Recommendations: Oral care BID Follow up Recommendations: Home health SLP;24 hour supervision/assistance SLP Visit Diagnosis:  Cognitive communication deficit (R41.841);Dysarthria and anarthria (R47.1) Plan: MBS       GO                Osie Bond., M.A. [facility] Pager [phone] Office [phone]  11/22/2020, 9:24 AM

## 2020-11-22 NOTE — Progress Notes (Signed)
Occupational Therapy Treatment Patient Details Name: Laura [name] MRN: [id number] DOB: 07/11/1951 Today's Date: 11/22/2020    History of present illness This 69 y.o. female admitted with sudden onset of Lt sided weakness, and Right gaze.  CTA of head showed dense Rt MCA and CTA confimred Rt M1 emergent occlusion.  She underwent revascularizaion 11/30.  PMH includes:  Systolic dysfunction, pulmonary HTN, OSA, obesity, CAD, HTN, COPD, chronic A-FIb, asthma, s/p bowel resection    OT comments  Pt seen in conjunction with PT.  She demonstrates improved activity tolerance, but requires mod A +2 for functional mobility/transfers. She demonstrates improved function Lt UE.  She continues to be insistent on home discharge and reports family is building a ramp.  HR to 158 with activity.   Follow Up Recommendations  Home health OT;Supervision/Assistance - 24 hour    Equipment Recommendations  Wheelchair (measurements OT)    Recommendations for Other Services      Precautions / Restrictions Precautions Precautions: Fall       Mobility Bed Mobility Overal bed mobility: Needs Assistance Bed Mobility: Supine to Sit;Sit to Supine     Supine to sit: Min guard Sit to supine: Mod assist   General bed mobility comments: pt bridged to EOB and came up from mildly raised HOB without assist.  Needed mod LE assist to get into bed.  Transfers Overall transfer level: Needs assistance Equipment used: Rolling walker (2 wheeled);None Transfers: Sit to/from Stand Sit to Stand: Min assist;+2 physical assistance         General transfer comment: assist forward with more stability than boost.    Balance Overall balance assessment: Needs assistance   Sitting balance-Leahy Scale: Fair       Standing balance-Leahy Scale: Poor Standing balance comment: pregait activity with RW and mod +2                           ADL either performed or assessed with clinical judgement   ADL                            Toilet Transfer: Moderate assistance;+2 for physical assistance;+2 for safety/equipment;BSC;RW                   Vision       Perception     Praxis      Cognition Arousal/Alertness: Awake/alert Behavior During Therapy: WFL for tasks assessed/performed Overall Cognitive Status: Impaired/Different from baseline Area of Impairment: Attention;Awareness                   Current Attention Level: Selective       Awareness: Emergent            Exercises Exercises: Other exercises Other Exercises Other Exercises: worked on functional reach Rt UE    Shoulder Instructions       General Comments HR rose through the session to a high of 158 bpm before pt able to reach an appropriate seating surface.  SpO2 adequately in the 90's    Pertinent Vitals/ Pain       Pain Assessment: Faces Faces Pain Scale: Hurts little more Pain Location: left shoulder with bed mobility Pain Descriptors / Indicators: Sore Pain Intervention(s): Monitored during session  Home Living  Prior Functioning/Environment              Frequency  Min 2X/week        Progress Toward Goals  OT Goals(current goals can now be found in the care plan section)  Progress towards OT goals: Progressing toward goals  Acute Rehab OT Goals Patient Stated Goal: To go home with family assistance   Plan Discharge plan remains appropriate    Co-evaluation    PT/OT/SLP Co-Evaluation/Treatment: Yes Reason for Co-Treatment: Complexity of the patient's impairments (multi-system involvement);For patient/therapist safety;To address functional/ADL transfers PT goals addressed during session: Mobility/safety with mobility;Strengthening/ROM OT goals addressed during session: ADL's and self-care      AM-PAC OT "6 Clicks" Daily Activity     Outcome Measure   Help from another person eating meals?: A  Little Help from another person taking care of personal grooming?: A Little Help from another person toileting, which includes using toliet, bedpan, or urinal?: A Lot Help from another person bathing (including washing, rinsing, drying)?: A Lot Help from another person to put on and taking off regular upper body clothing?: A Lot Help from another person to put on and taking off regular lower body clothing?: Total 6 Click Score: 13    End of Session Equipment Utilized During Treatment: Gait belt;Rolling walker  OT Visit Diagnosis: Unsteadiness on feet (R26.81);Hemiplegia and hemiparesis Hemiplegia - Right/Left: Left Hemiplegia - dominant/non-dominant: Non-Dominant Hemiplegia - caused by: Cerebral infarction   Activity Tolerance Patient tolerated treatment well   Patient Left in bed;with call bell/phone within reach;with bed alarm set   Nurse Communication Mobility status        Time: 0932-6712 OT Time Calculation (min): 23 min  Charges: OT General Charges $OT Visit: 1 Visit OT Treatments $Neuromuscular Re-education: 8-22 mins  Nilsa Nutting OTR/L Acute Rehabilitation Services Pager [phone] Office [phone]    Lucille Passy M 11/22/2020, 6:45 PM

## 2020-11-22 NOTE — Progress Notes (Signed)
STROKE TEAM PROGRESS NOTE   INTERVAL HISTORY Daughter at bedside.  Patient lying in bed, worked well with PT/OT.  Again recommend home PT/OT.  Patient still has left upper extremity proximal weakness, distally much improved.  Bilateral lower extremity strong.  Vitals:   11/22/20 0700 11/22/20 0800 11/22/20 0816 11/22/20 1000  BP:    (!) 133/102  Pulse: 100  80 (!) 26  Resp: 18  17 19   Temp:  98 F (36.7 C)    TempSrc:  Oral    SpO2: 98%  99% 98%  Weight:      Height:       CBC:  Recent Labs  Lab 11/20/20 1135 11/20/20 1135 11/20/20 1140 11/21/20 0500  WBC 11.5*  --   --  9.7  NEUTROABS 7.0  --   --  5.5  HGB 15.0   < > 15.3* 13.1  HCT 49.0*   < > 45.0 40.7  MCV 96.8  --   --  92.1  PLT 395  --   --  357   < > = values in this interval not displayed.   Basic Metabolic Panel:  Recent Labs  Lab 11/20/20 1135 11/20/20 1135 11/20/20 1140 11/21/20 0500  NA 136   < > 138 140  K 4.0   < > 3.7 3.7  CL 103   < > 107 106  CO2 21*  --   --  22  GLUCOSE 153*   < > 148* 114*  BUN 16   < > 19 20  CREATININE 1.06*   < > 0.90 0.93  CALCIUM 8.9  --   --  8.8*   < > = values in this interval not displayed.   Lipid Panel:  Recent Labs  Lab 11/21/20 0616  CHOL 151  TRIG 125  HDL 46  CHOLHDL 3.3  VLDL 25  LDLCALC 80   HgbA1c:  Recent Labs  Lab 11/21/20 0616  HGBA1C 6.2*   Urine Drug Screen: No results for input(s): LABOPIA, COCAINSCRNUR, LABBENZ, AMPHETMU, THCU, LABBARB in the last 168 hours.  Alcohol Level No results for input(s): ETH in the last 168 hours.  IMAGING past 24 hours ECHOCARDIOGRAM COMPLETE  Result Date: 11/21/2020    ECHOCARDIOGRAM REPORT   Patient Name:   Laura [name] Date of Exam: 11/21/2020 Medical Rec #:  [id number]        Height:       64.0 in Accession #:    [id number]       Weight:       265.0 lb Date of Birth:  [birth year]         BSA:          2.205 m Patient Age:    69 years         BP:           134/67 mmHg Patient Gender: F                 HR:           98 bpm. Exam Location:  Inpatient Procedure: 2D Echo Indications:    stroke 434.91  History:        Patient has prior history of Echocardiogram examinations, most                 recent 01/19/2020. Cardiomyopathy, CAD, COPD, Arrythmias:Atrial                 Fibrillation; Risk Factors:Hypertension and Dyslipidemia.  Pulmonary hypertension.  Sonographer:    [place] (AE) Referring Phys: [id number] ASHISH ARORA  Sonographer Comments: Suboptimal apical window and patient is morbidly obese. Image acquisition challenging due to patient body habitus, Image acquisition challenging due to respiratory motion and Image acquisition challenging due to COPD. restricted mobility. IMPRESSIONS  1. Left ventricular ejection fraction, by estimation, is 45 to 50%. The left ventricle has mildly decreased function. Left ventricular endocardial border not optimally defined to evaluate regional wall motion. Left ventricular diastolic parameters are indeterminate.  2. Right ventricular systolic function is mildly reduced. The right ventricular size is mildly enlarged.  3. The mitral valve is grossly normal. No evidence of mitral valve regurgitation.  4. The aortic valve was not well visualized. Aortic valve regurgitation is not visualized.  5. The inferior vena cava is normal in size with <50% respiratory variability, suggesting right atrial pressure of 8 mmHg. Comparison(s): A prior study was performed on 01/19/20. Unable to make significant comparisons (image quality). Likely, LV and RV mildly reduced. Conclusion(s)/Recommendation(s): Technically difficult study. FINDINGS  Left Ventricle: Left ventricular ejection fraction, by estimation, is 45 to 50%. The left ventricle has mildly decreased function. Left ventricular endocardial border not optimally defined to evaluate regional wall motion. The left ventricular internal cavity size was normal in size. There is borderline left ventricular hypertrophy.  Left ventricular diastolic parameters are indeterminate. Right Ventricle: The right ventricular size is mildly enlarged. Right vetricular wall thickness was not well visualized. Right ventricular systolic function is mildly reduced. Left Atrium: Left atrial size was not assessed. Right Atrium: Right atrial size was not well visualized. Pericardium: There is no evidence of pericardial effusion. Mitral Valve: The mitral valve is grossly normal. No evidence of mitral valve regurgitation. Tricuspid Valve: The tricuspid valve is not well visualized. Tricuspid valve regurgitation is not demonstrated. Aortic Valve: The aortic valve was not well visualized. Aortic valve regurgitation is not visualized. Pulmonic Valve: The pulmonic valve was not well visualized. Pulmonic valve regurgitation is not visualized. Aorta: The aortic root is normal in size and structure. Venous: The pulmonary veins were not well visualized. The inferior vena cava is normal in size with less than 50% respiratory variability, suggesting right atrial pressure of 8 mmHg. IAS/Shunts: The interatrial septum was not well visualized.  LEFT VENTRICLE PLAX 2D LVIDd:         5.10 cm LVIDs:         3.90 cm LV PW:         1.40 cm LV IVS:        1.00 cm LVOT diam:     2.10 cm LV SV:         38 LV SV Index:   17 LVOT Area:     3.46 cm  LEFT ATRIUM         Index LA diam:    5.00 cm 2.27 cm/m  AORTIC VALVE LVOT Vmax:   72.50 cm/s LVOT Vmean:  48.100 cm/s LVOT VTI:    0.109 m  AORTA Ao Root diam: 3.30 cm MITRAL VALVE MV Area (PHT): 2.62 cm    SHUNTS MV Decel Time: 290 msec    Systemic VTI:  0.11 m MV E velocity: 62.10 cm/s  Systemic Diam: 2.10 cm Rudean Haskell MD Electronically signed by Rudean Haskell MD Signature Date/Time: 11/21/2020/5:54:12 PM    Final     PHYSICAL EXAM   Temp:  [98 F (36.7 C)-98.9 F (37.2 C)] 98 F (36.7 C) (12/02 0800) Pulse Rate:  [  26-100] 26 (12/02 1000) Resp:  [15-21] 19 (12/02 1000) BP: (93-149)/(50-121) 133/102  (12/02 1000) SpO2:  [90 %-100 %] 98 % (12/02 1000)  General - Well nourished, well developed, in no apparent distress.  Ophthalmologic - fundi not visualized due to noncooperation.  Cardiovascular - irregularly irregular heart rate and rhythm  Mental Status -  Level of arousal and orientation to time, place, and person were intact. Language including expression, naming, repetition, comprehension was assessed and found intact. Fund of Knowledge was assessed and was intact.  Cranial Nerves II - XII - II - Visual field intact OU. III, IV, VI - Extraocular movements intact. V - Facial sensation intact bilaterally. VII - mild left lower facial weakness. VIII - Hearing & vestibular intact bilaterally. X - Palate elevates symmetrically. XI - Chin turning & shoulder shrug intact bilaterally. XII - Tongue protrusion intact.  Motor Strength - The patient's strength was normal in right UE and LE, however, LUE proximal 3-/5, bicep 4/5 and tricep 4/5 with finger grip 4+/5. LLE 5-/5 proximal and 5/5 distal.  Bulk was normal and fasciculations were absent.   Motor Tone - Muscle tone was assessed at the neck and appendages and was normal.  Reflexes - The patient's reflexes were symmetrical in all extremities and she had no pathological reflexes.  Sensory - Light touch, temperature/pinprick were assessed and were symmetrical.    Coordination - The patient had normal movements in the hands with no ataxia or dysmetria although left FTN is very slow and hard to complete but no ataxia.  Tremor was absent.  Gait and Station - deferred.   ASSESSMENT/PLAN Laura [name] is a 69 y.o. female with history of chronic atrial fibrillation on Eliquis, COPD, gastroesophageal reflux disease, heart failure with midrange ejection fraction, hypertension, hyperlipidemia, nonischemic cardiomyopathy, obstructive sleep apnea, obesity, presenting with L sided weakness w/ R gaze preference, slurred speech and L  facial droop. No tPA as on Eliquis. Found to have R  MCA occlusion. Sent to IR.   Stroke:  Patchy R MCA infarcts due to right M1 occlusion s/p IR w/ TICI3 revascularization, infarct embolic secondary to known AF   Code Stroke CT head No acute abnormality. Dense R M1. ASPECTS 10.     CTA head & neck ELVO R M1. Mild ICA atherosclerosis. L subclavian origin 55% stenosis.  Cerebral angio / IR TICI3 revascularization R MCA occlusion   Post IR CT small SAH post perisylvian fissure w/ contrast stain R parietal subcortical region  MRI patchy R MCA territory infarcts w/ small SAH  MRA patent R MCA   2D Echo EF 45 to 50%  LDL 80  HgbA1c 6.2  VTE prophylaxis - SCDs   Eliquis (apixaban) daily prior to admission, now on aspirin 325.  Will consider resume Eliquis in 3-5 days post stroke.   Therapy recommendations:  [place] OT, [place] PT, [place] SLP   Disposition:  pending   Atrial Fibrillation, chronic  Home anticoagulation:  Eliquis (apixaban) daily   was off eliquis last Sunday 11/20 to Wednesday 11/24 due to lumbar injection. Reported restarted on Thursday 11/25. However, during the period off eliquis, pt had a couple of episodes of left hand weakness. Since 11/25, although restarted eliquis, pt continued to have some episodic HA and confusion.   On digoxin and cardizem . Now on aspirin 325 . Will consider resume Eliquis in 3-5 days post stroke   Hypertension  Home meds:  diltiazem CD 360, lasix 20 bid, losartan 25, spironolactone  25  Stable  Off cleviprex  Resumed Cardizem, Lasix and spironolactone . BP goal < 180/105  . Long-term BP goal normotensive  Hyperlipidemia  Home meds:  crestor 20  Resume Crestor 20  LDL 80, goal < 70  Continue statin at discharge  Dysphagia . Secondary to stroke . NPO -> dys 2 thin . Speech on board   Other Stroke Risk Factors  Advanced Age >/= 36   Morbid Obesity, Body mass index is 45.49 kg/m., BMI >/= 30 associated with increased stroke  risk, recommend weight loss, diet and exercise as appropriate   Obstructive sleep apnea, on CPAP at home  Coronary Artery Disease  Systolic Congestive heart failure (HFmrEF)  NICM, EF 40-45%  Hx DVT  Other Active Problems  Anxiety On xanax 0.5 bid PTA. Resumed  COPD w/o exacerbation -> off IVF, resume breathing treatment as needed  GERD  Hospital day # 2  This patient is critically ill due to right MCA stroke, right M1 occlusion status post thrombectomy, chronic A. fib, hypertension and at significant risk of neurological worsening, death form recurrent stroke, heart failure, seizure, hemorrhagic conversion. This patient's care requires constant monitoring of vital signs, hemodynamics, respiratory and cardiac monitoring, review of multiple databases, neurological assessment, discussion with family, other specialists and medical decision making of high complexity. I spent 35 minutes of neurocritical care time in the care of this patient. I had long discussion with daughter and patient at bedside, updated pt current condition, treatment plan and potential prognosis, and answered all the questions.  They expressed understanding and appreciation.    Rosalin Hawking, MD PhD Stroke Neurology 11/22/2020 11:49 AM  To contact Stroke Continuity provider, please refer to [email or web address] After hours, contact General Neurology

## 2020-11-22 NOTE — Progress Notes (Signed)
Modified Barium Swallow Progress Note  Patient Details  Name: Laura [name] MRN: [id number] Date of Birth: [birth year]  Today's Date: 11/22/2020  Modified Barium Swallow completed.  Full report located under Chart Review in the Imaging Section.  Brief recommendations include the following:  Clinical Impression  Pt presents with a mild oropharyngeal dysphagia marked by left sensorimotor deficits leading to anterior spillage, mildly decreased bolus cohesion, and premature spillage of liquids deep into the pharynx before the swallow triggered.  This loss of control of thin liquids led to consistent and frank penetration into the laryngeal vestibule, however, there was NO ASPIRATION despite repeated swallows and taxing of the mechanism with successive boluses of thin. Penetrant was consistently ejected from the larynx upon completion of the swallow.  Nectar liquids were swallowed without penetration.  There was excellent pharyngeal stripping with no residue post-swallow.  Recommend continuing current diet of dysphagia 2, thin liquids. Meds should be given whole in puree.  Pt's tendency toward impulsivity and her decreased awareness may increase her aspiration risk marginally.  SLP will follow along for safety/education/training.     Swallow Evaluation Recommendations       SLP Diet Recommendations: Dysphagia 2 (Fine chop) solids;Thin liquid   Liquid Administration via: Cup;Straw   Medication Administration: Whole meds with puree   Supervision: Patient able to self feed;Intermittent supervision to cue for compensatory strategies   Compensations: Slow rate;Small sips/bites;Lingual sweep for clearance of pocketing;Monitor for anterior loss       Oral Care Recommendations: Oral care BID       Finlee Concepcion L. Tivis Ringer, [place] Office number [phone] Pager [phone]  Juan Quam Laurice 11/22/2020,1:32 PM

## 2020-11-22 NOTE — Progress Notes (Signed)
Physical Therapy Treatment Patient Details Name: Laura [name] MRN: [id number] DOB: 01-31-1951 Today's Date: 11/22/2020    History of Present Illness This 69 y.o. female admitted with sudden onset of Lt sided weakness, and Right gaze.  CTA of head showed dense Rt MCA and CTA confimred Rt M1 emergent occlusion.  She underwent revascularizaion 11/30.  PMH includes:  Systolic dysfunction, pulmonary HTN, OSA, obesity, CAD, HTN, COPD, chronic A-FIb, asthma, s/p bowel resection     PT Comments    Pt progressing steadily, Very determined.  Emphasis on transition, sit to stand, pre gait to gait.  Pt needing mod +2 for gait stability/safety with the RW   Follow Up Recommendations  Home health PT;Supervision/Assistance - 24 hour (per pt's wishes)     Equipment Recommendations  Wheelchair (measurements PT);Wheelchair cushion (measurements PT)    Recommendations for Other Services       Precautions / Restrictions Precautions Precautions: Fall    Mobility  Bed Mobility Overal bed mobility: Needs Assistance Bed Mobility: Supine to Sit;Sit to Supine     Supine to sit: Min guard Sit to supine: Mod assist   General bed mobility comments: pt bridged to EOB and came up from mildly raised HOB without assist.  Needed mod LE assist to get into bed.  Transfers Overall transfer level: Needs assistance Equipment used: Rolling walker (2 wheeled);None Transfers: Sit to/from Stand Sit to Stand: Min assist;+2 physical assistance         General transfer comment: assist forward with more stability than boost.  Ambulation/Gait Ambulation/Gait assistance: Mod assist;+2 safety/equipment Gait Distance (Feet): 14 Feet Assistive device: Rolling walker (2 wheeled) Gait Pattern/deviations: Step-to pattern;Decreased step length - right;Decreased step length - left;Decreased stance time - left;Decreased stride length   Gait velocity interpretation: <1.31 ft/sec, indicative of household  ambulator General Gait Details: cues for sequencing, assist for w/shifting.  Pt with uncoordinated step on the left, paretic in nature with weak knee in stance and resultant short step on the Right.  Pt needed occasional assist keeping L hand on the RW   Stairs             Wheelchair Mobility    Modified Rankin (Stroke Patients Only) Modified Rankin (Stroke Patients Only) Modified Rankin: Moderately severe disability     Balance Overall balance assessment: Needs assistance   Sitting balance-Leahy Scale: Fair       Standing balance-Leahy Scale: Poor Standing balance comment: pregait activity with RW and mod +2                            Cognition Arousal/Alertness: Awake/alert Behavior During Therapy: WFL for tasks assessed/performed Overall Cognitive Status: Impaired/Different from baseline Area of Impairment: Attention;Awareness                   Current Attention Level: Selective       Awareness: Emergent          Exercises      General Comments General comments (skin integrity, edema, etc.): HR rose through the session to a high of 158 bpm before pt able to reach an appropriate seating surface.  SpO2 adequately in the 90's      Pertinent Vitals/Pain Pain Assessment: Faces Faces Pain Scale: Hurts little more Pain Location: left shoulder with bed mobility Pain Descriptors / Indicators: Sore Pain Intervention(s): Monitored during session    Home Living  Prior Function            PT Goals (current goals can now be found in the care plan section) Acute Rehab PT Goals Patient Stated Goal: To go home with family assistance  PT Goal Formulation: With patient Time For Goal Achievement: 12/05/20 Potential to Achieve Goals: Good Progress towards PT goals: Progressing toward goals    Frequency    Min 3X/week      PT Plan Current plan remains appropriate    Co-evaluation PT/OT/SLP  Co-Evaluation/Treatment: Yes Reason for Co-Treatment: For patient/therapist safety PT goals addressed during session: Mobility/safety with mobility;Strengthening/ROM        AM-PAC PT "6 Clicks" Mobility   Outcome Measure  Help needed turning from your back to your side while in a flat bed without using bedrails?: A Little Help needed moving from lying on your back to sitting on the side of a flat bed without using bedrails?: A Little Help needed moving to and from a bed to a chair (including a wheelchair)?: A Lot Help needed standing up from a chair using your arms (e.g., wheelchair or bedside chair)?: A Lot Help needed to walk in hospital room?: A Lot Help needed climbing 3-5 steps with a railing? : Total 6 Click Score: 13    End of Session   Activity Tolerance: Patient tolerated treatment well;Patient limited by fatigue Patient left: in bed;with call bell/phone within reach;with bed alarm set;with family/visitor present Nurse Communication: Mobility status PT Visit Diagnosis: Other abnormalities of gait and mobility (R26.89);Hemiplegia and hemiparesis Hemiplegia - Right/Left: Left Hemiplegia - dominant/non-dominant: Non-dominant Hemiplegia - caused by: Cerebral infarction     Time: 4967-5916 PT Time Calculation (min) (ACUTE ONLY): 25 min  Charges:  $Gait Training: 8-22 mins                     11/22/2020  Ginger Carne., PT Acute Rehabilitation Services [phone]  (pager) [phone]  (office)   Tessie Fass Maayan Jenning 11/22/2020, 6:34 PM

## 2020-11-23 DIAGNOSIS — I428 Other cardiomyopathies: Secondary | ICD-10-CM

## 2020-11-23 DIAGNOSIS — E785 Hyperlipidemia, unspecified: Secondary | ICD-10-CM | POA: Diagnosis present

## 2020-11-23 LAB — GLUCOSE, CAPILLARY
Glucose-Capillary: 133 mg/dL — ABNORMAL HIGH (ref 70–99)
Glucose-Capillary: 151 mg/dL — ABNORMAL HIGH (ref 70–99)

## 2020-11-23 MED ORDER — ASPIRIN 325 MG PO TBEC: 325.0000 mg | DELAYED_RELEASE_TABLET | Freq: Every day | ORAL | 0 refills | Status: AC

## 2020-11-23 MED ORDER — APIXABAN 5 MG PO TABS: 5.0000 mg | ORAL_TABLET | Freq: Two times a day (BID) | ORAL | 6 refills | Status: DC

## 2020-11-23 MED ORDER — ONDANSETRON HCL 4 MG/2ML IJ SOLN
4.0000 mg | Freq: Four times a day (QID) | INTRAMUSCULAR | Status: DC | PRN
  Administered 2020-11-23: 4 mg via INTRAVENOUS
  Filled 2020-11-23: qty 2

## 2020-11-23 NOTE — Discharge Instructions (Signed)
Femoral Site Care This sheet gives you information about how to care for yourself after your procedure. Your health care provider may also give you more specific instructions. If you have problems or questions, contact your health care provider. What can I expect after the procedure? After the procedure, it is common to have:  Bruising that usually fades within 1-2 weeks.  Tenderness at the site. Follow these instructions at home: Wound care 1. Follow instructions from your health care provider about how to take care of your insertion site. Make sure you: ? Wash your hands with soap and water before you change your bandage (dressing). If soap and water are not available, use hand sanitizer. ? Change your dressing as directed- pressure dressing removed 24 hours post-procedure (and switch for bandaid), bandaid removed 72 hours post-procedure 2. Do not take baths, swim, or use a hot tub for 7 days post-procedure. 3. You may shower 48 hours after the procedure or as told by your health care provider. ? Gently wash the site with plain soap and water. ? Pat the area dry with a clean towel. ? Do not rub the site. This may cause bleeding. 4. Check your site every day for signs of infection. Check for: ? Redness, swelling, or pain. ? Fluid or blood. ? Warmth. ? Pus or a bad smell. Activity  Do not stoop, bend, or lift anything that is heavier than 10 lb (4.5 kg) for 2 weeks post-procedure.  Do not drive self for 2 weeks post-procedure. Contact a health care provider if you have:  A fever or chills.  You have redness, swelling, or pain around your insertion site. Get help right away if:  The catheter insertion area swells very fast.  You pass out.  You suddenly start to sweat or your skin gets clammy.  The catheter insertion area is bleeding, and the bleeding does not stop when you hold steady pressure on the area.  The area near or just beyond the catheter insertion site becomes  pale, cool, tingly, or numb. These symptoms may represent a serious problem that is an emergency. Do not wait to see if the symptoms will go away. Get medical help right away. Call your local emergency services (911 in the [place] Do not drive yourself to the hospital.  This information is not intended to replace advice given to you by your health care provider. Make sure you discuss any questions you have with your health care provider. Document Revised: 12/21/2017 Document Reviewed: 12/21/2017 Elsevier Patient Education  2020 [employer] 

## 2020-11-23 NOTE — TOC Transition Note (Signed)
Transition of Care [facility]) - CM/SW Discharge Note   Patient Details  Name: Laura [name] MRN: [id number] Date of Birth: 19-Jun-1951  [facility]) CM/SW Contact:  Pollie Friar, RN Phone Number: 11/23/2020, 2:17 PM   Clinical Narrative:    Pt with orders for [facility] services. CM met with the patient and her spouse. They asked to use [place]. Kenzie with [facility] accepted the referral. Pt and spouse state they have a wheelchair already at home. Spouse states he can provide 24 hour supervision.  Spouse to provide transport home.   Final next level of care: Home w Home Health Services Barriers to Discharge: No Barriers Identified   Patient Goals and CMS Choice   CMS Medicare.gov Compare Post Acute Care list provided to:: Patient Choice offered to / list presented to : Patient, Spouse  Discharge Placement                       Discharge Plan and Services                          HH Arranged: PT, OT, Speech Therapy [place] Agency: [place] ([facility]) Date [facility] Agency Contacted: 11/23/20   Representative spoke with at [place]: [place] ([place]) Interventions     Readmission Risk Interventions Readmission Risk Prevention Plan 09/18/2019  Medication Screening Complete  Transportation Screening Complete  Some recent data might be hidden

## 2020-11-23 NOTE — Progress Notes (Signed)
Pt's IVs removed; sites are clean, dry and intact. Discharge instructions were reviewed with pt and spouse; voiced understanding. Pt's personal belongings were packed by pt's spouse. Pt transported via wheelchair to private vehicle driven by husband.

## 2020-11-23 NOTE — Progress Notes (Signed)
  Speech Language Pathology Treatment: Dysphagia  Patient Details Name: Laura [name] MRN: [id number] DOB: 11-10-1951 Today's Date: 11/23/2020 Time: 1048-1100 SLP Time Calculation (min) (ACUTE ONLY): 12 min  Assessment / Plan / Recommendation Clinical Impression  Pt seen for ongoing dysphagia management.  RN reports nausea today with recent administration of zofran.  Pt tolerated regular texture solids and thin liquid with no clinical s/s of aspiration and exhibited good oral clearance of solids.  Pt independently verbalized and demonstrated use of lingual sweep.  Pt states that current diet is very unappetizing to her and is causing her nausea. Based on pt's clinical presentation today, independence with swallow strategies, and MBSS 12/2 revealing pt is protecting airway, will upgrade diet to mechanical soft for palatability.  SLP will follow for diet tolerance.  Recommend mechanical soft solids (dysphagia 3) and thin liquids with intermittent supervision with meals for safety and use of compensatory strategies.      HPI HPI: Pt is a 69 y.o. female admitted with sudden onset of Lt sided weakness and Rt gaze.  CTH showed dense Rt MCA and CTA confimred Rt M1 emergent occlusion.  She underwent revascularizaion 11/30.  PMH includes:  Systolic dysfunction, pulmonary HTN, OSA, obesity, CAD, HTN, COPD, chronic A-FIb, asthma, s/p bowel resection, GERD      SLP Plan  Continue with current plan of care       Recommendations  Diet recommendations: Dysphagia 3 (mechanical soft);Thin liquid Liquids provided via: Straw;Cup Medication Administration: Whole meds with liquid Supervision: Intermittent supervision to cue for compensatory strategies Compensations: Slow rate;Small sips/bites;Lingual sweep for clearance of pocketing;Monitor for anterior loss Postural Changes and/or Swallow Maneuvers: Seated upright 90 degrees;Upright 30-60 min after meal                Oral Care Recommendations: Oral  care BID Follow up Recommendations: Home health SLP;24 hour supervision/assistance SLP Visit Diagnosis: Dysphagia, oropharyngeal phase (R13.12) Plan: Continue with current plan of care       [place], [place], [place] Office: [phone]  11/23/2020, 11:06 AM

## 2020-11-23 NOTE — Discharge Summary (Addendum)
Stroke Discharge Summary  Patient ID: Laura [name]   MRN: [id number]      DOB: 01-24-1951  Date of Admission: 11/20/2020 Date of Discharge: 11/23/2020  Attending Physician:  Rosalin Hawking, MD, Stroke MD Consultant(s):     Olene Craven) Estanislado Pandy, MD (Interventional Neuroradiologist)  Patient's PCP:  [name], MD  DISCHARGE DIAGNOSIS:  Principal Problem:   Acute R MCA ischemic stroke [facility]) s/p revascularization R M1, embolic d/t known AF Active Problems:   Generalized anxiety disorder   Essential hypertension   Chronic atrial fibrillation (HCC)   Gastro-esophageal reflux disease without esophagitis   OSA on CPAP   Morbid obesity (HCC)   Obstructive chronic bronchitis without exacerbation (HCC)   PAD (peripheral artery disease) (HCC)   Chronic systolic heart failure ([place])   Middle cerebral artery embolism, right   Hyperlipidemia LDL goal <70   NICM (nonischemic cardiomyopathy) ([place])   Allergies as of 11/23/2020      Reactions   Flecainide Shortness Of Breath   Metoprolol Shortness Of Breath, Swelling, Other (See Comments)   "My limbs swell" also   Propafenone Shortness Of Breath, Swelling, Other (See Comments)   "My limbs swell" also   Rivaroxaban Swelling, Other (See Comments)   Xarelto- "My limbs swell"      Medication List    STOP taking these medications   omeprazole 40 MG capsule Commonly known as: PRILOSEC   predniSONE 10 MG tablet Commonly known as: DELTASONE   rosuvastatin 20 MG tablet Commonly known as: CRESTOR     TAKE these medications   Accu-Chek Guide test strip Generic drug: glucose blood Use as instructed to check blood sugars daily 2 hours after meal   Accu-Chek Softclix Lancets lancets Use as instructed to check blood sugars daily 2 hours after meal   Advil PM 200-25 MG Caps Generic drug: Ibuprofen-diphenhydrAMINE HCl Take 2 tablets by mouth at bedtime as needed (sleep).   albuterol 108 (90 Base) MCG/ACT inhaler Commonly known  as: VENTOLIN HFA Inhale 2 puffs into the lungs every 4 (four) hours as needed for wheezing or shortness of breath.   ALPRAZolam 0.25 MG tablet Commonly known as: XANAX Take 1 tablet (0.25 mg total) by mouth 2 (two) times daily. What changed:   when to take this  reasons to take this   apixaban 5 MG Tabs tablet Commonly known as: ELIQUIS Take 1 tablet (5 mg total) by mouth 2 (two) times daily. Start taking on: November 25, 2020 What changed: These instructions start on November 25, 2020. If you are unsure what to do until then, ask your doctor or other care provider.   aspirin 325 MG EC tablet Take 1 tablet (325 mg total) by mouth daily for 1 day. Start taking on: November 24, 2020   benzonatate 100 MG capsule Commonly known as: TESSALON Take 1 capsule (100 mg total) by mouth 2 (two) times daily as needed for cough.   budesonide 0.25 MG/2ML nebulizer solution Commonly known as: PULMICORT Take 2 mLs (0.25 mg total) by nebulization 2 (two) times daily.   cyanocobalamin 1000 MCG/ML injection Commonly known as: (VITAMIN B-12) Inject 1,000 mcg into the muscle every 30 (thirty) days.   digoxin 0.125 MG tablet Commonly known as: LANOXIN Take 1 tablet (0.125 mg total) by mouth daily.   diltiazem 360 MG 24 hr capsule Commonly known as: Cardizem CD Take 1 capsule (360 mg total) by mouth daily.   EPINEPHrine 0.3 mg/0.3 mL Soaj injection Commonly known  as: EPI-PEN Inject 0.3 mg into the muscle as needed for anaphylaxis.   furosemide 20 MG tablet Commonly known as: LASIX Take 20 mg by mouth daily.   ipratropium-albuterol 0.5-2.5 (3) MG/3ML Soln Commonly known as: DUONEB Take 3 mLs by nebulization every 4 (four) hours as needed.   loperamide 2 MG capsule Commonly known as: IMODIUM Take 4 mg by mouth as needed for diarrhea or loose stools.   losartan 25 MG tablet Commonly known as: COZAAR Take 1 tablet (25 mg total) by mouth daily.   montelukast 10 MG tablet Commonly known  as: SINGULAIR Take 1 tablet (10 mg total) by mouth daily.   ondansetron 4 MG tablet Commonly known as: ZOFRAN Take 1 tablet (4 mg total) by mouth every 8 (eight) hours as needed for nausea or vomiting.   Probiotic-10 Caps Take 1 capsule by mouth daily.   spironolactone 25 MG tablet Commonly known as: ALDACTONE Take 25 mg by mouth 2 (two) times daily.   theophylline 300 MG 12 hr tablet Commonly known as: THEODUR Take 1 tablet (300 mg total) by mouth 2 (two) times daily.   traMADol 50 MG tablet Commonly known as: ULTRAM Take 50 mg by mouth every 6 (six) hours as needed for moderate pain.       LABORATORY STUDIES CBC    Component Value Date/Time   WBC 9.7 11/21/2020 0500   RBC 4.42 11/21/2020 0500   HGB 13.1 11/21/2020 0500   HGB 14.2 10/12/2020 1112   HCT 40.7 11/21/2020 0500   HCT 43.6 10/12/2020 1112   PLT 357 11/21/2020 0500   PLT 357 10/12/2020 1112   MCV 92.1 11/21/2020 0500   MCV 94 10/12/2020 1112   MCV 90 11/28/2013 2312   MCH 29.6 11/21/2020 0500   MCHC 32.2 11/21/2020 0500   RDW 14.3 11/21/2020 0500   RDW 14.0 10/12/2020 1112   RDW 13.6 11/28/2013 2312   LYMPHSABS 3.0 11/21/2020 0500   LYMPHSABS 3.6 (H) 10/12/2020 1112   MONOABS 0.8 11/21/2020 0500   EOSABS 0.3 11/21/2020 0500   EOSABS 0.6 (H) 10/12/2020 1112   BASOSABS 0.0 11/21/2020 0500   BASOSABS 0.1 10/12/2020 1112   CMP    Component Value Date/Time   NA 140 11/21/2020 0500   NA 140 10/12/2020 1112   NA 140 11/28/2013 2312   K 3.7 11/21/2020 0500   K 4.1 11/28/2013 2312   CL 106 11/21/2020 0500   CL 108 (H) 11/28/2013 2312   CO2 22 11/21/2020 0500   CO2 27 11/28/2013 2312   GLUCOSE 114 (H) 11/21/2020 0500   GLUCOSE 130 (H) 11/28/2013 2312   BUN 20 11/21/2020 0500   BUN 16 10/12/2020 1112   BUN 29 (H) 11/28/2013 2312   CREATININE 0.93 11/21/2020 0500   CREATININE 0.79 11/28/2013 2312   CALCIUM 8.8 (L) 11/21/2020 0500   CALCIUM 8.7 11/28/2013 2312   PROT 6.8 11/20/2020 1135   PROT  6.4 10/12/2020 1112   PROT 6.9 11/28/2013 2312   ALBUMIN 3.5 11/20/2020 1135   ALBUMIN 3.9 10/12/2020 1112   ALBUMIN 3.6 11/28/2013 2312   AST 15 11/20/2020 1135   AST 24 11/28/2013 2312   ALT 15 11/20/2020 1135   ALT 21 11/28/2013 2312   ALKPHOS 61 11/20/2020 1135   ALKPHOS 65 11/28/2013 2312   BILITOT 0.8 11/20/2020 1135   BILITOT 0.5 10/12/2020 1112   BILITOT 0.3 11/28/2013 2312   GFRNONAA >60 11/21/2020 0500   GFRNONAA >60 11/28/2013 2312   GFRAA 89  10/12/2020 1112   GFRAA >60 11/28/2013 2312   COAGS Lab Results  Component Value Date   INR 1.2 11/20/2020   INR 3.6 (A) 06/13/2020   INR 3.9 (A) 06/11/2020   Lipid Panel    Component Value Date/Time   CHOL 151 11/21/2020 0616   CHOL 196 03/20/2020 0830   TRIG 125 11/21/2020 0616   HDL 46 11/21/2020 0616   HDL 68 03/20/2020 0830   CHOLHDL 3.3 11/21/2020 0616   VLDL 25 11/21/2020 0616   LDLCALC 80 11/21/2020 0616   LDLCALC 103 (H) 03/20/2020 0830   HgbA1C  Lab Results  Component Value Date   HGBA1C 6.2 (H) 11/21/2020   Urinalysis    Component Value Date/Time   COLORURINE YELLOW (A) 06/29/2016 0215   APPEARANCEUR Clear 07/24/2020 1059   LABSPEC 1.014 06/29/2016 0215   PHURINE 5.0 06/29/2016 0215   GLUCOSEU Negative 07/24/2020 1059   HGBUR 2+ (A) 06/29/2016 0215   BILIRUBINUR Negative 07/24/2020 1059   KETONESUR 1+ (A) 06/29/2016 0215   PROTEINUR Negative 07/24/2020 1059   PROTEINUR NEGATIVE 06/29/2016 0215   NITRITE Negative 07/24/2020 1059   NITRITE NEGATIVE 06/29/2016 0215   LEUKOCYTESUR Negative 07/24/2020 1059    SIGNIFICANT DIAGNOSTIC STUDIES CT Code Stroke CTA Head W/WO contrast  Result Date: 11/20/2020 CLINICAL DATA:  Right MCA syndrome. EXAM: CT ANGIOGRAPHY HEAD AND NECK TECHNIQUE: Multidetector CT imaging of the head and neck was performed using the standard protocol during bolus administration of intravenous contrast. Multiplanar CT image reconstructions and MIPs were obtained to evaluate the  vascular anatomy. Carotid stenosis measurements (when applicable) are obtained utilizing NASCET criteria, using the distal internal carotid diameter as the denominator. CONTRAST:  Dose is currently not known COMPARISON:  Noncontrast head CT from earlier today FINDINGS: CTA NECK FINDINGS Aortic arch: Atheromatous plaque.  Four vessel branching. Right carotid system: No significant stenosis or atherosclerosis. Negative for dissection or beading Left carotid system: No significant atherosclerosis and no stenosis or beading. Vertebral arteries: Calcified plaque at the left subclavian origin with 55% stenosis based on sagittal reformats. The left vertebral artery arises from the arch near the subclavian origin and is widely patent. The right vertebral artery is tortuous but widely patent. Skeleton: Unremarkable Other neck: Bilateral cataract resection. Upper chest: Negative Review of the MIP images confirms the above findings CTA HEAD FINDINGS Anterior circulation: Abrupt right M1 cut off consistent with acute thrombosis. Calcified plaque along the bilateral carotid siphons without significant stenosis. Negative for aneurysm. No contralateral embolism is seen. Posterior circulation: The vertebral and basilar arteries are widely patent. No branch occlusion, beading, or aneurysm. Venous sinuses: Not assessed on this arterial study Anatomic variants: None significant Review of the MIP images confirms the above findings Critical Value/emergent results were called by telephone at the time of interpretation on 11/20/2020 at 11:52 am to provider [facility] , who verbally acknowledged these results. IMPRESSION: 1. Emergent large vessel occlusion at the right M1 segment. 2. No underlying flow limiting stenosis or visible embolic source. Carotid atherosclerosis is mild for age. 3. 55% left subclavian artery origin stenosis. Electronically Signed   By: Monte Fantasia M.D.   On: 11/20/2020 11:59   CT Code Stroke CTA Neck W/WO  contrast  Result Date: 11/20/2020 CLINICAL DATA:  Right MCA syndrome. EXAM: CT ANGIOGRAPHY HEAD AND NECK TECHNIQUE: Multidetector CT imaging of the head and neck was performed using the standard protocol during bolus administration of intravenous contrast. Multiplanar CT image reconstructions and MIPs were obtained to evaluate  the vascular anatomy. Carotid stenosis measurements (when applicable) are obtained utilizing NASCET criteria, using the distal internal carotid diameter as the denominator. CONTRAST:  Dose is currently not known COMPARISON:  Noncontrast head CT from earlier today FINDINGS: CTA NECK FINDINGS Aortic arch: Atheromatous plaque.  Four vessel branching. Right carotid system: No significant stenosis or atherosclerosis. Negative for dissection or beading Left carotid system: No significant atherosclerosis and no stenosis or beading. Vertebral arteries: Calcified plaque at the left subclavian origin with 55% stenosis based on sagittal reformats. The left vertebral artery arises from the arch near the subclavian origin and is widely patent. The right vertebral artery is tortuous but widely patent. Skeleton: Unremarkable Other neck: Bilateral cataract resection. Upper chest: Negative Review of the MIP images confirms the above findings CTA HEAD FINDINGS Anterior circulation: Abrupt right M1 cut off consistent with acute thrombosis. Calcified plaque along the bilateral carotid siphons without significant stenosis. Negative for aneurysm. No contralateral embolism is seen. Posterior circulation: The vertebral and basilar arteries are widely patent. No branch occlusion, beading, or aneurysm. Venous sinuses: Not assessed on this arterial study Anatomic variants: None significant Review of the MIP images confirms the above findings Critical Value/emergent results were called by telephone at the time of interpretation on 11/20/2020 at 11:52 am to provider [facility] , who verbally acknowledged these  results. IMPRESSION: 1. Emergent large vessel occlusion at the right M1 segment. 2. No underlying flow limiting stenosis or visible embolic source. Carotid atherosclerosis is mild for age. 3. 55% left subclavian artery origin stenosis. Electronically Signed   By: Monte Fantasia M.D.   On: 11/20/2020 11:59   MR ANGIO HEAD WO CONTRAST  Result Date: 11/21/2020 CLINICAL DATA:  Stroke follow-up. Status post revascularization of right M1 occlusion. EXAM: MRI HEAD WITHOUT CONTRAST MRA HEAD WITHOUT CONTRAST TECHNIQUE: Multiplanar, multiecho pulse sequences of the brain and surrounding structures were obtained without intravenous contrast. Angiographic images of the head were obtained using MRA technique without contrast. COMPARISON:  Head CT and CTA 11/20/2020 FINDINGS: MRI HEAD FINDINGS Brain: There are patchy acute right MCA territory infarcts with greatest involvement of the temporal lobe, insula, and basal ganglia and milder involvement of frontal and parietal lobe cortex and white matter. There is a small amount of subarachnoid hemorrhage in the posterior aspect of the right sylvian fissure and within a right parietal sulcus, also present on the CT obtained in the interventional suite following revascularization. There is at most minimal parenchymal petechial hemorrhage. No mass, midline shift, or extra-axial fluid collection is identified. The ventricles are normal in size. Vascular: Major intracranial vascular flow voids are preserved. Skull and upper cervical spine: Unremarkable bone marrow signal. Sinuses/Orbits: Bilateral cataract extraction. Paranasal sinuses and mastoid air cells are clear. Other: None. MRA HEAD FINDINGS The study is moderately motion degraded. The visualized distal vertebral arteries are widely patent to the basilar and codominant. Patent right PICA, left AICA, and bilateral SCA origins are identified. The basilar artery is widely patent. Both PCAs are patent without evidence of a flow  limiting proximal stenosis. The internal carotid arteries are patent from skull base to carotid termini. Assessment is limited by motion artifact, particularly in the cavernous and paraclinoid regions, however no significant ICA stenosis was evident on yesterday's CTA. The right MCA remains patent following revascularization, in the left MCA and both ACAs also remain patent. Again, detailed assessment is limited by motion artifact without evidence of a significant proximal stenosis on yesterday's CTA. No aneurysm is identified. IMPRESSION: 1. Patchy  acute right MCA territory infarcts with small volume subarachnoid hemorrhage. 2. Motion degraded head MRA. Patent right MCA following revascularization. Electronically Signed   By: Logan Bores M.D.   On: 11/21/2020 12:08   MR BRAIN WO CONTRAST  Result Date: 11/21/2020 CLINICAL DATA:  Stroke follow-up. Status post revascularization of right M1 occlusion. EXAM: MRI HEAD WITHOUT CONTRAST MRA HEAD WITHOUT CONTRAST TECHNIQUE: Multiplanar, multiecho pulse sequences of the brain and surrounding structures were obtained without intravenous contrast. Angiographic images of the head were obtained using MRA technique without contrast. COMPARISON:  Head CT and CTA 11/20/2020 FINDINGS: MRI HEAD FINDINGS Brain: There are patchy acute right MCA territory infarcts with greatest involvement of the temporal lobe, insula, and basal ganglia and milder involvement of frontal and parietal lobe cortex and white matter. There is a small amount of subarachnoid hemorrhage in the posterior aspect of the right sylvian fissure and within a right parietal sulcus, also present on the CT obtained in the interventional suite following revascularization. There is at most minimal parenchymal petechial hemorrhage. No mass, midline shift, or extra-axial fluid collection is identified. The ventricles are normal in size. Vascular: Major intracranial vascular flow voids are preserved. Skull and upper  cervical spine: Unremarkable bone marrow signal. Sinuses/Orbits: Bilateral cataract extraction. Paranasal sinuses and mastoid air cells are clear. Other: None. MRA HEAD FINDINGS The study is moderately motion degraded. The visualized distal vertebral arteries are widely patent to the basilar and codominant. Patent right PICA, left AICA, and bilateral SCA origins are identified. The basilar artery is widely patent. Both PCAs are patent without evidence of a flow limiting proximal stenosis. The internal carotid arteries are patent from skull base to carotid termini. Assessment is limited by motion artifact, particularly in the cavernous and paraclinoid regions, however no significant ICA stenosis was evident on yesterday's CTA. The right MCA remains patent following revascularization, in the left MCA and both ACAs also remain patent. Again, detailed assessment is limited by motion artifact without evidence of a significant proximal stenosis on yesterday's CTA. No aneurysm is identified. IMPRESSION: 1. Patchy acute right MCA territory infarcts with small volume subarachnoid hemorrhage. 2. Motion degraded head MRA. Patent right MCA following revascularization. Electronically Signed   By: Logan Bores M.D.   On: 11/21/2020 12:08   IR CT Head Ltd  Result Date: 11/22/2020 INDICATION: New onset left-sided hemiplegia, right gaze deviation and slurred speech. Occluded right middle cerebral artery on CT angiogram of the head and neck. EXAM: 1. EMERGENT LARGE VESSEL OCCLUSION THROMBOLYSIS (anterior CIRCULATION) COMPARISON:  CT angiogram of the head and neck of November 20, 2020. MEDICATIONS: Ancef 2 g antibiotic was administered within 1 hour of the procedure. ANESTHESIA/SEDATION: General anesthesia CONTRAST:  Omnipaque 300 approximately 100 mL FLUOROSCOPY TIME:  Fluoroscopy Time: 22 minutes 18 seconds (1335 mGy). COMPLICATIONS: None immediate. TECHNIQUE: Following a full explanation of the procedure along with the  potential associated complications, an informed witnessed consent was obtained from the patient's spouse. The risks of intracranial hemorrhage of 10%, worsening neurological deficit, ventilator dependency, death and inability to revascularize were all reviewed in detail with the patient's spouse. The patient was then put under general anesthesia by the Department of Anesthesiology at [facility]. The right groin was prepped and draped in the usual sterile fashion. Thereafter using modified Seldinger technique, transfemoral access into the right common femoral artery was obtained without difficulty. Over a 0.035 inch guidewire an 8 French 25 cm Pinnacle sheath was inserted. Through this, and also over a 0.035  inch guidewire a 5 [place] JB 1 catheter was advanced to the aortic arch region and selectively positioned in the innominate artery and the right common carotid artery. FINDINGS: The innominate arteriogram demonstrates the right subclavian artery and the right common carotid artery proximally to be widely patent. The right vertebral artery origin is widely patent. The vessel is seen to opacify to the cranial skull base. Patency is maintained of the right vertebrobasilar junction and the right posterior-inferior cerebellar artery. The opacified portion of the basilar artery, the right posterior cerebral artery, the right superior cerebellar artery and the right anterior inferior cerebellar artery demonstrates wide patency into the capillary and venous phases on the lateral projection. The right common carotid arteriogram demonstrates the right external carotid artery and its major branches to be widely patent. The right internal carotid artery at the bulb to the cranial skull base is widely patent. Patency is maintained of the petrous, cavernous and supraclinoid segments. The right posterior communicating artery is seen transiently opacifying the right posterior cerebral artery distribution. The right  anterior cerebral artery opacifies into the capillary and venous phases. Complete truncation of the right middle cerebral artery just distal to its origin is seen. PROCEDURE: Over a 0.035 inch Rosen exchange guidewire, the 5 [place] diagnostic catheter was exchanged for a 95 cm 087 balloon guide catheter which had been prepped with 50% contrast and 50% heparinized saline infusion and positioned at the origin of the right internal carotid artery. The guidewire was removed. Good aspiration obtained from the hub of the balloon guide catheter. A gentle control arteriogram performed through this demonstrated no evidence of spasms, dissections or of intraluminal filling defects. Over a 0.014 inch standard Synchro micro guidewire with a J-tip configuration, a combination of an 018 Trevo ProVue microcatheter inside of the 071 136 cm Zoom aspiration catheter was advanced to the supraclinoid right ICA without any difficulty. The balloon guide was advanced into the distal cervical right ICA. Using a torque device, the micro guidewire was then advanced without difficulty through the occluded right middle cerebral artery into the inferior division M2 M3 region followed by the microcatheter. The guidewire was removed. Good aspiration obtained from the hub of the microcatheter. A gentle control arteriogram performed through the microcatheter demonstrated safe position of the tip of the microcatheter which was then connected to continuous heparinized saline infusion. A 6.5 mm x 45 mm Embotrap retrieval device was then advanced to the distal end of the microcatheter. The O ring on the delivery microcatheter was then loosened. With slight forward gentle traction with the right hand on the delivery micro guidewire with the left hand the delivery microcatheter was retrieved deploying the retrieval device the proximal end of which engaged the proximal portion of the occluded right middle cerebral artery M1 segment. Aspiration catheter  was then advanced into the mid M1 region. Thereafter, constant aspiration was applied at the hub of the aspiration catheter, and with proximal flow arrest in the right internal carotid artery with a 20 mL syringe at the hub of the balloon guide catheter for approximately 2 minutes. The retrieval device was slowly withdrawn into the aspiration catheter and the combination was retrieved and removed. Following reversal of proximal flow arrest, a control arteriogram performed through the balloon guide in the right internal carotid artery demonstrated complete revascularization of the right middle cerebral artery distribution with patency of the right posterior communicating artery and the right anterior cerebral arteries. Mild spasm in the distal right internal carotid artery responded  to 2 aliquots of 25 mcg of nitroglycerin intra-arterially. Repeat arteriogram performed 5 minutes later continued to demonstrate improved opacification to the internal carotid artery, with maintenance of a TICI 3 revascularization of the right middle cerebral artery distribution. No evidence of mass effect or midline shift was seen. No change was seen in the patient's blood pressure or heart rate. The delayed capillary phase demonstrated a small focal area of hypo perfusion in the medial mid temporal subcortical region. The balloon guide was then retrieved and removed. The 8 French Pinnacle sheath was replaced with an 8 French Angio-Seal closure device for hemostasis. Distal pulses remained Dopplerable in the lower extremities unchanged. A CT of the brain demonstrated no mass or midline shift. There was a small amount of contrast seen in the posterior perisylvian region extending into the medial temporal region, probably representing contrast +/-subarachnoid hemorrhage component. Patient's general anesthesia was reversed and the patient was extubated. Upon recovery, the patient was able to obey simple commands appropriately. She was able  to bend her left knee and bend her left elbow. She was then returned to the neuro ICU for post revascularization management. IMPRESSION: Status post revascularization of occluded right middle cerebral proximal M1 segment with 1 pass using the 6.5 mm x 45 mm Embotrap retrieval device and proximal aspiration achieving a TICI 3 revascularization. PLAN: Follow-up p.r.n. in 4-6 weeks post discharge. Electronically Signed   By: Luanne Bras M.D.   On: 11/21/2020 09:36   DG Swallowing Func-Speech Pathology  Result Date: 11/22/2020   Objective Swallowing Evaluation: Type of Study: MBS-Modified Barium Swallow Study  Patient Details Name: Laura [name] MRN: [id number] Date of Birth: 09-04-1951 Today's Date: 11/22/2020 Time: SLP Start Time (ACUTE ONLY): 79 -SLP Stop Time (ACUTE ONLY): 1300 SLP Time Calculation (min) (ACUTE ONLY): 30 min Past Medical History: Past Medical History: Diagnosis Date . Asthma  . Chronic atrial fibrillation ([place])   a. diagnosed in 09/2016; b. failed flecainide and propafenone due to LE swelling and SOB, could not afford Multaq; c. CHADS2VASc => 5 (CHF, HTN, age x 1, nonobs CAD, female)--> Eliquis . COPD (chronic obstructive pulmonary disease) ([place])  . GERD (gastroesophageal reflux disease)  . HFmrEF (heart failure with mid-range ejection fraction) ([place])   a. 12/2019 Echo: EF 40-45%. . Hyperlipidemia  . Hypertension  . NICM (nonischemic cardiomyopathy) ([place])   a. 12/2019 Echo: EF 40-45%, glob HK, mildly reduced RV fxn, sev dil LA. *HR 130 (afib) during study. . Nonobstructive CAD (coronary artery disease)   a. Lexiscan Myoview 10/2016: no evidence of ischemia, EF 53%; b. 02/2020 Cath: LM nl, LAD 20p, 12m, LCX 20ost, OM1/2/3 nl, LPDA nl, LPL1/2 nl, LPAV nl, RCA small, nl. . Obesity  . Obstructive sleep apnea  . Pulmonary hypertension ([place])  . Systolic dysfunction   a. TTE 10/2016: EF 50%, mild LVH, moderately dilated LA, moderate MR/TR, mild pulmonary hypertension Past Surgical History: Past  Surgical History: Procedure Laterality Date . BOWEL RESECTION  09/11/2019  Procedure: SMALL BOWEL RESECTION;  Surgeon: Herbert Pun, MD;  Location: [facility] ORS;  Service: General;; . CARDIAC CATHETERIZATION   . cataract surgery   . COLONOSCOPY WITH PROPOFOL N/A 03/19/2020  Procedure: COLONOSCOPY WITH PROPOFOL;  Surgeon: [name], MD;  Location: [facility] ENDOSCOPY;  Service: Gastroenterology;  Laterality: N/A; . CORONARY ANGIOPLASTY   . INCISION AND DRAINAGE ABSCESS Right 06/29/2016  Procedure: INCISION AND DRAINAGE ABSCESS;  Surgeon: Florene Glen, MD;  Location: [facility] ORS;  Service: General;  Laterality: Right; . INCISION AND DRAINAGE  OF WOUND Left 06/29/2016  Procedure: IRRIGATION AND DEBRIDEMENT WOUND;  Surgeon: Florene Glen, MD;  Location: [facility] ORS;  Service: General;  Laterality: Left; . IR ANGIO VERTEBRAL SEL SUBCLAVIAN INNOMINATE UNI R MOD SED  11/20/2020 . IR CT HEAD LTD  11/20/2020 . IR PERCUTANEOUS ART THROMBECTOMY/INFUSION INTRACRANIAL INC DIAG ANGIO  11/20/2020 . LAPAROSCOPIC RIGHT COLECTOMY  09/11/2019  Procedure: RIGHT COLECTOMY;  Surgeon: Herbert Pun, MD;  Location: [facility] ORS;  Service: General;; . LAPAROSCOPY N/A 09/11/2019  Procedure: LAPAROSCOPY DIAGNOSTIC;  Surgeon: Herbert Pun, MD;  Location: [facility] ORS;  Service: General;  Laterality: N/A; . LAPAROTOMY N/A 09/13/2019  Procedure: REOPENING OF RECENT LAPAROTOMYANASTOMOSIS OF BOWEL;  Surgeon: Herbert Pun, MD;  Location: [facility] ORS;  Service: General;  Laterality: N/A; . RADIOLOGY WITH ANESTHESIA N/A 11/20/2020  Procedure: IR WITH ANESTHESIA - CODE STROKE;  Surgeon: Radiologist, Medication, MD;  Location: [place];  Service: Radiology;  Laterality: N/A; . RIGHT/LEFT HEART CATH AND CORONARY ANGIOGRAPHY Bilateral 02/27/2020  Procedure: RIGHT/LEFT HEART CATH AND CORONARY ANGIOGRAPHY;  Surgeon: Wellington Hampshire, MD;  Location: [place] CV LAB;  Service: Cardiovascular;  Laterality: Bilateral; . VISCERAL ANGIOGRAPHY N/A  09/12/2019  Procedure: VISCERAL ANGIOGRAPHY;  Surgeon: Algernon Huxley, MD;  Location: [place] CV LAB;  Service: Cardiovascular;  Laterality: N/A; HPI: Pt is a 69 y.o. female admitted with sudden onset of Lt sided weakness and Rt gaze.  CTH showed dense Rt MCA and CTA confimred Rt M1 emergent occlusion.  She underwent revascularizaion 11/30.  PMH includes:  Systolic dysfunction, pulmonary HTN, OSA, obesity, CAD, HTN, COPD, chronic A-FIb, asthma, s/p bowel resection, GERD  Subjective: talkative Assessment / Plan / Recommendation CHL IP CLINICAL IMPRESSIONS 11/22/2020 Clinical Impression Pt presents with a mild oropharyngeal dysphagia marked by left sensorimotor deficits leading to anterior spillage, mildly decreased bolus cohesion, and premature spillage of liquids deep into the pharynx before the swallow triggered.  This loss of control of thin liquids led to consistent and frank penetration into the laryngeal vestibule, however, there was NO ASPIRATION despite repeated swallows and taxing of the mechanism with successive boluses of thin. Penetrant was consistently ejected from the larynx upon completion of the swallow.  Nectar liquids were swallowed without penetration.  There was excellent pharyngeal stripping with no residue post-swallow.  Recommend continuing current diet of dysphagia 2, thin liquids. Meds should be given whole in puree.  Pt's tendency toward impulsivity and her decreased awareness may increase her aspiration risk marginally.  SLP will follow along for safety/education/training.   SLP Visit Diagnosis Dysphagia, oropharyngeal phase (R13.12) Attention and concentration deficit following -- Frontal lobe and executive function deficit following -- Impact on safety and function Mild aspiration risk   CHL IP TREATMENT RECOMMENDATION 11/22/2020 Treatment Recommendations Therapy as outlined in treatment plan below   Prognosis 11/21/2020 Prognosis for Safe Diet Advancement Good Barriers to Reach Goals --  Barriers/Prognosis Comment -- CHL IP DIET RECOMMENDATION 11/22/2020 SLP Diet Recommendations Dysphagia 2 (Fine chop) solids;Thin liquid Liquid Administration via Cup;Straw Medication Administration Whole meds with puree Compensations Slow rate;Small sips/bites;Lingual sweep for clearance of pocketing;Monitor for anterior loss Postural Changes --   CHL IP OTHER RECOMMENDATIONS 11/22/2020 Recommended Consults -- Oral Care Recommendations Oral care BID Other Recommendations --   CHL IP FOLLOW UP RECOMMENDATIONS 11/22/2020 Follow up Recommendations Home health SLP;24 hour supervision/assistance   CHL IP FREQUENCY AND DURATION 11/21/2020 Speech Therapy Frequency (ACUTE ONLY) min 2x/week Treatment Duration --      CHL IP ORAL PHASE 11/22/2020 Oral Phase Impaired  Oral - Pudding Teaspoon -- Oral - Pudding Cup -- Oral - Honey Teaspoon -- Oral - Honey Cup -- Oral - Nectar Teaspoon -- Oral - Nectar Cup -- Oral - Nectar Straw Left anterior bolus loss;Premature spillage Oral - Thin Teaspoon -- Oral - Thin Cup -- Oral - Thin Straw Left anterior bolus loss;Premature spillage Oral - Puree Decreased bolus cohesion Oral - Mech Soft -- Oral - Regular Decreased bolus cohesion Oral - Multi-Consistency -- Oral - Pill -- Oral Phase - Comment --  CHL IP PHARYNGEAL PHASE 11/22/2020 Pharyngeal Phase Impaired Pharyngeal- Pudding Teaspoon -- Pharyngeal -- Pharyngeal- Pudding Cup -- Pharyngeal -- Pharyngeal- Honey Teaspoon -- Pharyngeal -- Pharyngeal- Honey Cup -- Pharyngeal -- Pharyngeal- Nectar Teaspoon -- Pharyngeal -- Pharyngeal- Nectar Cup -- Pharyngeal -- Pharyngeal- Nectar Straw Delayed swallow initiation-pyriform sinuses Pharyngeal -- Pharyngeal- Thin Teaspoon -- Pharyngeal -- Pharyngeal- Thin Cup -- Pharyngeal -- Pharyngeal- Thin Straw Delayed swallow initiation-pyriform sinuses;Penetration/Aspiration before swallow Pharyngeal Material enters airway, CONTACTS cords and then ejected out Pharyngeal- Puree Delayed swallow initiation-vallecula  Pharyngeal -- Pharyngeal- Mechanical Soft -- Pharyngeal -- Pharyngeal- Regular Delayed swallow initiation-vallecula Pharyngeal -- Pharyngeal- Multi-consistency -- Pharyngeal -- Pharyngeal- Pill -- Pharyngeal -- Pharyngeal Comment --  No flowsheet data found. Juan Quam Laurice 11/22/2020, 1:31 PM              ECHOCARDIOGRAM COMPLETE  Result Date: 11/21/2020    ECHOCARDIOGRAM REPORT   Patient Name:   Laura [name] Date of Exam: 11/21/2020 Medical Rec #:  [id number]        Height:       64.0 in Accession #:    [id number]       Weight:       265.0 lb Date of Birth:  07/11/1951         BSA:          2.205 m Patient Age:    89 years         BP:           134/67 mmHg Patient Gender: F                HR:           98 bpm. Exam Location:  Inpatient Procedure: 2D Echo Indications:    stroke 434.91  History:        Patient has prior history of Echocardiogram examinations, most                 recent 01/19/2020. Cardiomyopathy, CAD, COPD, Arrythmias:Atrial                 Fibrillation; Risk Factors:Hypertension and Dyslipidemia.                 Pulmonary hypertension.  Sonographer:    Cromberg (AE) Referring Phys: [id number] ASHISH ARORA  Sonographer Comments: Suboptimal apical window and patient is morbidly obese. Image acquisition challenging due to patient body habitus, Image acquisition challenging due to respiratory motion and Image acquisition challenging due to COPD. restricted mobility. IMPRESSIONS  1. Left ventricular ejection fraction, by estimation, is 45 to 50%. The left ventricle has mildly decreased function. Left ventricular endocardial border not optimally defined to evaluate regional wall motion. Left ventricular diastolic parameters are indeterminate.  2. Right ventricular systolic function is mildly reduced. The right ventricular size is mildly enlarged.  3. The mitral valve is grossly normal. No evidence of mitral valve regurgitation.  4. The aortic valve was not well visualized. Aortic valve  regurgitation is not visualized.  5. The inferior vena cava is normal in size with <50% respiratory variability, suggesting right atrial pressure of 8 mmHg. Comparison(s): A prior study was performed on 01/19/20. Unable to make significant comparisons (image quality). Likely, LV and RV mildly reduced. Conclusion(s)/Recommendation(s): Technically difficult study. FINDINGS  Left Ventricle: Left ventricular ejection fraction, by estimation, is 45 to 50%. The left ventricle has mildly decreased function. Left ventricular endocardial border not optimally defined to evaluate regional wall motion. The left ventricular internal cavity size was normal in size. There is borderline left ventricular hypertrophy. Left ventricular diastolic parameters are indeterminate. Right Ventricle: The right ventricular size is mildly enlarged. Right vetricular wall thickness was not well visualized. Right ventricular systolic function is mildly reduced. Left Atrium: Left atrial size was not assessed. Right Atrium: Right atrial size was not well visualized. Pericardium: There is no evidence of pericardial effusion. Mitral Valve: The mitral valve is grossly normal. No evidence of mitral valve regurgitation. Tricuspid Valve: The tricuspid valve is not well visualized. Tricuspid valve regurgitation is not demonstrated. Aortic Valve: The aortic valve was not well visualized. Aortic valve regurgitation is not visualized. Pulmonic Valve: The pulmonic valve was not well visualized. Pulmonic valve regurgitation is not visualized. Aorta: The aortic root is normal in size and structure. Venous: The pulmonary veins were not well visualized. The inferior vena cava is normal in size with less than 50% respiratory variability, suggesting right atrial pressure of 8 mmHg. IAS/Shunts: The interatrial septum was not well visualized.  LEFT VENTRICLE PLAX 2D LVIDd:         5.10 cm LVIDs:         3.90 cm LV PW:         1.40 cm LV IVS:        1.00 cm LVOT diam:      2.10 cm LV SV:         38 LV SV Index:   17 LVOT Area:     3.46 cm  LEFT ATRIUM         Index LA diam:    5.00 cm 2.27 cm/m  AORTIC VALVE LVOT Vmax:   72.50 cm/s LVOT Vmean:  48.100 cm/s LVOT VTI:    0.109 m  AORTA Ao Root diam: 3.30 cm MITRAL VALVE MV Area (PHT): 2.62 cm    SHUNTS MV Decel Time: 290 msec    Systemic VTI:  0.11 m MV E velocity: 62.10 cm/s  Systemic Diam: 2.10 cm Rudean Haskell MD Electronically signed by Rudean Haskell MD Signature Date/Time: 11/21/2020/5:54:12 PM    Final    IR PERCUTANEOUS ART THROMBECTOMY/INFUSION INTRACRANIAL INC DIAG ANGIO  Result Date: 11/22/2020 INDICATION: New onset left-sided hemiplegia, right gaze deviation and slurred speech. Occluded right middle cerebral artery on CT angiogram of the head and neck. EXAM: 1. EMERGENT LARGE VESSEL OCCLUSION THROMBOLYSIS (anterior CIRCULATION) COMPARISON:  CT angiogram of the head and neck of November 20, 2020. MEDICATIONS: Ancef 2 g antibiotic was administered within 1 hour of the procedure. ANESTHESIA/SEDATION: General anesthesia CONTRAST:  Omnipaque 300 approximately 100 mL FLUOROSCOPY TIME:  Fluoroscopy Time: 22 minutes 18 seconds (1335 mGy). COMPLICATIONS: None immediate. TECHNIQUE: Following a full explanation of the procedure along with the potential associated complications, an informed witnessed consent was obtained from the patient's spouse. The risks of intracranial hemorrhage of 10%, worsening neurological deficit, ventilator dependency, death and inability to revascularize were all reviewed in detail with the patient's spouse. The patient was then put under general anesthesia  by the Department of Anesthesiology at [facility]. The right groin was prepped and draped in the usual sterile fashion. Thereafter using modified Seldinger technique, transfemoral access into the right common femoral artery was obtained without difficulty. Over a 0.035 inch guidewire an 8 French 25 cm Pinnacle sheath was  inserted. Through this, and also over a 0.035 inch guidewire a 5 [place] JB 1 catheter was advanced to the aortic arch region and selectively positioned in the innominate artery and the right common carotid artery. FINDINGS: The innominate arteriogram demonstrates the right subclavian artery and the right common carotid artery proximally to be widely patent. The right vertebral artery origin is widely patent. The vessel is seen to opacify to the cranial skull base. Patency is maintained of the right vertebrobasilar junction and the right posterior-inferior cerebellar artery. The opacified portion of the basilar artery, the right posterior cerebral artery, the right superior cerebellar artery and the right anterior inferior cerebellar artery demonstrates wide patency into the capillary and venous phases on the lateral projection. The right common carotid arteriogram demonstrates the right external carotid artery and its major branches to be widely patent. The right internal carotid artery at the bulb to the cranial skull base is widely patent. Patency is maintained of the petrous, cavernous and supraclinoid segments. The right posterior communicating artery is seen transiently opacifying the right posterior cerebral artery distribution. The right anterior cerebral artery opacifies into the capillary and venous phases. Complete truncation of the right middle cerebral artery just distal to its origin is seen. PROCEDURE: Over a 0.035 inch Rosen exchange guidewire, the 5 [place] diagnostic catheter was exchanged for a 95 cm 087 balloon guide catheter which had been prepped with 50% contrast and 50% heparinized saline infusion and positioned at the origin of the right internal carotid artery. The guidewire was removed. Good aspiration obtained from the hub of the balloon guide catheter. A gentle control arteriogram performed through this demonstrated no evidence of spasms, dissections or of intraluminal filling defects.  Over a 0.014 inch standard Synchro micro guidewire with a J-tip configuration, a combination of an 018 Trevo ProVue microcatheter inside of the 071 136 cm Zoom aspiration catheter was advanced to the supraclinoid right ICA without any difficulty. The balloon guide was advanced into the distal cervical right ICA. Using a torque device, the micro guidewire was then advanced without difficulty through the occluded right middle cerebral artery into the inferior division M2 M3 region followed by the microcatheter. The guidewire was removed. Good aspiration obtained from the hub of the microcatheter. A gentle control arteriogram performed through the microcatheter demonstrated safe position of the tip of the microcatheter which was then connected to continuous heparinized saline infusion. A 6.5 mm x 45 mm Embotrap retrieval device was then advanced to the distal end of the microcatheter. The O ring on the delivery microcatheter was then loosened. With slight forward gentle traction with the right hand on the delivery micro guidewire with the left hand the delivery microcatheter was retrieved deploying the retrieval device the proximal end of which engaged the proximal portion of the occluded right middle cerebral artery M1 segment. Aspiration catheter was then advanced into the mid M1 region. Thereafter, constant aspiration was applied at the hub of the aspiration catheter, and with proximal flow arrest in the right internal carotid artery with a 20 mL syringe at the hub of the balloon guide catheter for approximately 2 minutes. The retrieval device was slowly withdrawn into the aspiration catheter and the  combination was retrieved and removed. Following reversal of proximal flow arrest, a control arteriogram performed through the balloon guide in the right internal carotid artery demonstrated complete revascularization of the right middle cerebral artery distribution with patency of the right posterior communicating  artery and the right anterior cerebral arteries. Mild spasm in the distal right internal carotid artery responded to 2 aliquots of 25 mcg of nitroglycerin intra-arterially. Repeat arteriogram performed 5 minutes later continued to demonstrate improved opacification to the internal carotid artery, with maintenance of a TICI 3 revascularization of the right middle cerebral artery distribution. No evidence of mass effect or midline shift was seen. No change was seen in the patient's blood pressure or heart rate. The delayed capillary phase demonstrated a small focal area of hypo perfusion in the medial mid temporal subcortical region. The balloon guide was then retrieved and removed. The 8 French Pinnacle sheath was replaced with an 8 French Angio-Seal closure device for hemostasis. Distal pulses remained Dopplerable in the lower extremities unchanged. A CT of the brain demonstrated no mass or midline shift. There was a small amount of contrast seen in the posterior perisylvian region extending into the medial temporal region, probably representing contrast +/-subarachnoid hemorrhage component. Patient's general anesthesia was reversed and the patient was extubated. Upon recovery, the patient was able to obey simple commands appropriately. She was able to bend her left knee and bend her left elbow. She was then returned to the neuro ICU for post revascularization management. IMPRESSION: Status post revascularization of occluded right middle cerebral proximal M1 segment with 1 pass using the 6.5 mm x 45 mm Embotrap retrieval device and proximal aspiration achieving a TICI 3 revascularization. PLAN: Follow-up p.r.n. in 4-6 weeks post discharge. Electronically Signed   By: Luanne Bras M.D.   On: 11/21/2020 09:36   CT HEAD CODE STROKE WO CONTRAST  Result Date: 11/20/2020 CLINICAL DATA:  Code stroke.  Left-sided weakness EXAM: CT HEAD WITHOUT CONTRAST TECHNIQUE: Contiguous axial images were obtained from the  base of the skull through the vertex without intravenous contrast. COMPARISON:  September 2020 FINDINGS: Brain: No acute intracranial hemorrhage or mass effect. No loss of gray-white differentiation. Ventricles and sulci are within normal limits in size and configuration no extra-axial collection. Vascular: Hyperdensity of the right M1 MCA likely reflecting thrombus. There is intracranial atherosclerotic calcification at the skull base. Skull: Unremarkable. Sinuses/Orbits: No acute abnormality. Other: Mastoid air cells are clear. ASPECTS [facility] Stroke Program Early CT Score) - Ganglionic level infarction (caudate, lentiform nuclei, internal capsule, insula, M1-M3 cortex): 7 - Supraganglionic infarction (M4-M6 cortex): 3 Total score (0-10 with 10 being normal): 10 IMPRESSION: 1. No acute intracranial hemorrhage. 2. Dense right M1 MCA likely reflecting thrombus. 3. ASPECTS is 10. These results were called by telephone at the time of interpretation on 11/20/2020 at 11:55 am to provider Dr. Rory Percy, who verbally acknowledged these results. Electronically Signed   By: Macy Mis M.D.   On: 11/20/2020 12:00   IR ANGIO VERTEBRAL SEL SUBCLAVIAN INNOMINATE UNI R MOD SED  Result Date: 11/22/2020 INDICATION: New onset left-sided hemiplegia, right gaze deviation and slurred speech. Occluded right middle cerebral artery on CT angiogram of the head and neck.  EXAM: 1. EMERGENT LARGE VESSEL OCCLUSION THROMBOLYSIS (anterior CIRCULATION)  COMPARISON:  CT angiogram of the head and neck of November 20, 2020.  MEDICATIONS: Ancef 2 g antibiotic was administered within 1 hour of the procedure.  ANESTHESIA/SEDATION: General anesthesia  CONTRAST:  Omnipaque 300 approximately 100 mL  FLUOROSCOPY  TIME:  Fluoroscopy Time: 22 minutes 18 seconds (1335 mGy).  COMPLICATIONS: None immediate.  TECHNIQUE: Following a full explanation of the procedure along with the potential associated complications, an informed witnessed consent was  obtained from the patient's spouse. The risks of intracranial hemorrhage of 10%, worsening neurological deficit, ventilator dependency, death and inability to revascularize were all reviewed in detail with the patient's spouse.  The patient was then put under general anesthesia by the Department of Anesthesiology at [facility].  The right groin was prepped and draped in the usual sterile fashion. Thereafter using modified Seldinger technique, transfemoral access into the right common femoral artery was obtained without difficulty. Over a 0.035 inch guidewire an 8 French 25 cm Pinnacle sheath was inserted. Through this, and also over a 0.035 inch guidewire a 5 [place] JB 1 catheter was advanced to the aortic arch region and selectively positioned in the innominate artery and the right common carotid artery.  FINDINGS: The innominate arteriogram demonstrates the right subclavian artery and the right common carotid artery proximally to be widely patent.  The right vertebral artery origin is widely patent. The vessel is seen to opacify to the cranial skull base. Patency is maintained of the right vertebrobasilar junction and the right posterior-inferior cerebellar artery. The opacified portion of the basilar artery, the right posterior cerebral artery, the right superior cerebellar artery and the right anterior inferior cerebellar artery demonstrates wide patency into the capillary and venous phases on the lateral projection.  The right common carotid arteriogram demonstrates the right external carotid artery and its major branches to be widely patent.  The right internal carotid artery at the bulb to the cranial skull base is widely patent.  Patency is maintained of the petrous, cavernous and supraclinoid segments. The right posterior communicating artery is seen transiently opacifying the right posterior cerebral artery distribution.  The right anterior cerebral artery opacifies into the capillary and  venous phases. Complete truncation of the right middle cerebral artery just distal to its origin is seen.  PROCEDURE: Over a 0.035 inch Rosen exchange guidewire, the 5 [place] diagnostic catheter was exchanged for a 95 cm 087 balloon guide catheter which had been prepped with 50% contrast and 50% heparinized saline infusion and positioned at the origin of the right internal carotid artery. The guidewire was removed. Good aspiration obtained from the hub of the balloon guide catheter. A gentle control arteriogram performed through this demonstrated no evidence of spasms, dissections or of intraluminal filling defects. Over a 0.014 inch standard Synchro micro guidewire with a J-tip configuration, a combination of an 018 Trevo ProVue microcatheter inside of the 071 136 cm Zoom aspiration catheter was advanced to the supraclinoid right ICA without any difficulty. The balloon guide was advanced into the distal cervical right ICA.  Using a torque device, the micro guidewire was then advanced without difficulty through the occluded right middle cerebral artery into the inferior division M2 M3 region followed by the microcatheter. The guidewire was removed. Good aspiration obtained from the hub of the microcatheter. A gentle control arteriogram performed through the microcatheter demonstrated safe position of the tip of the microcatheter which was then connected to continuous heparinized saline infusion. A 6.5 mm x 45 mm Embotrap retrieval device was then advanced to the distal end of the microcatheter. The O ring on the delivery microcatheter was then loosened. With slight forward gentle traction with the right hand on the delivery micro guidewire with the left hand the delivery microcatheter was retrieved deploying  the retrieval device the proximal end of which engaged the proximal portion of the occluded right middle cerebral artery M1 segment.  Aspiration catheter was then advanced into the mid M1 region. Thereafter,  constant aspiration was applied at the hub of the aspiration catheter, and with proximal flow arrest in the right internal carotid artery with a 20 mL syringe at the hub of the balloon guide catheter for approximately 2 minutes.  The retrieval device was slowly withdrawn into the aspiration catheter and the combination was retrieved and removed. Following reversal of proximal flow arrest, a control arteriogram performed through the balloon guide in the right internal carotid artery demonstrated complete revascularization of the right middle cerebral artery distribution with patency of the right posterior communicating artery and the right anterior cerebral arteries.  Mild spasm in the distal right internal carotid artery responded to 2 aliquots of 25 mcg of nitroglycerin intra-arterially.  Repeat arteriogram performed 5 minutes later continued to demonstrate improved opacification to the internal carotid artery, with maintenance of a TICI 3 revascularization of the right middle cerebral artery distribution.  No evidence of mass effect or midline shift was seen. No change was seen in the patient's blood pressure or heart rate.  The delayed capillary phase demonstrated a small focal area of hypo perfusion in the medial mid temporal subcortical region.  The balloon guide was then retrieved and removed. The 8 French Pinnacle sheath was replaced with an 8 French Angio-Seal closure device for hemostasis. Distal pulses remained Dopplerable in the lower extremities unchanged.  A CT of the brain demonstrated no mass or midline shift. There was a small amount of contrast seen in the posterior perisylvian region extending into the medial temporal region, probably representing contrast +/-subarachnoid hemorrhage component.  Patient's general anesthesia was reversed and the patient was extubated. Upon recovery, the patient was able to obey simple commands appropriately.  She was able to bend her left knee and bend her  left elbow. She was then returned to the neuro ICU for post revascularization management.  IMPRESSION: Status post revascularization of occluded right middle cerebral proximal M1 segment with 1 pass using the 6.5 mm x 45 mm Embotrap retrieval device and proximal aspiration achieving a TICI 3 revascularization.  PLAN: Follow-up p.r.n. in 4-6 weeks post discharge.   Electronically Signed   By: Luanne Bras M.D.   On: 11/21/2020 09:36      HISTORY OF PRESENT ILLNESS Laura [name] is a 69 y.o. female past medical history of chronic atrial fibrillation on Eliquis last dose last night, COPD, gastroesophageal reflux disease, heart failure with midrange ejection fraction, hypertension, hyperlipidemia, nonischemic cardiomyopathy, obstructive sleep apnea, obesity, presenting to the emergency room from home for sudden onset of left-sided weakness. She woke up normally this morning 11/20/2020 and somewhere around 10:30 AM was noted by the husband to be on the floor, where she slouched from the couch on which she was sitting.  She reported she fell and could not get up.  He noticed left-sided weakness.  She also had a gaze preference to the right. She was also slurring her words and and left-sided facial droop.  EMS evaluated her and activated a code stroke.  Her systolic blood pressure was 180s 190s. Her blood sugars were within normal range. She was brought into the emergency room, last dose of Eliquis time confirmed precluded her from getting TPA. Noncontrast head CT showed a dense right MCA with an aspects of 10. CTA confirmed the right M1 emergent occlusion.  Discussed with husband the possibility for endovascular thrombectomy and he agreed and consented. Husband also provided history that she has bilateral lower extremity DVTs. Premorbid modified Rankin scale (mRS): 1    HOSPITAL COURSE [name] is a 69 y.o. female with history of chronic atrial fibrillation on Eliquis, COPD,  gastroesophageal reflux disease, heart failure with midrange ejection fraction, hypertension, hyperlipidemia, nonischemic cardiomyopathy, obstructive sleep apnea, obesity, presenting with L sided weakness w/ R gaze preference, slurred speech and L facial droop. No tPA as on Eliquis. Found to have R  MCA occlusion. Sent to IR.   Stroke:  Patchy R MCA infarcts due to right M1 occlusion s/p IR w/ TICI3 revascularization, infarct embolic secondary to known AF   Code Stroke CT head No acute abnormality. Dense R M1. ASPECTS 10.     CTA head & neck ELVO R M1. Mild ICA atherosclerosis. L subclavian origin 55% stenosis.  Cerebral angio / IR TICI3 revascularization R MCA occlusion   Post IR CT small SAH post perisylvian fissure w/ contrast stain R parietal subcortical region  MRI patchy R MCA territory infarcts w/ small SAH  MRA patent R MCA   2D Echo EF 45 to 50%  LDL 80  HgbA1c 6.2  Eliquis (apixaban) daily prior to admission, now on aspirin 325.  Resume Eliquis Sunday morning, Dec 5, stop aspirin at that time.   Therapy recommendations:  [place] OT, [place] PT, [place] SLP   Disposition:  return home  Atrial Fibrillation, chronic  Home anticoagulation:  Eliquis (apixaban) daily   was off eliquis last Sunday 11/20 to Wednesday 11/24 due to lumbar injection. Reported restarted on Thursday 11/25. However, during the period off eliquis, pt had a couple of episodes of left hand weakness. Since 11/25, although restarted eliquis, pt continued to have some episodic HA and confusion.   On digoxin and cardizem  Now on aspirin 325  Resume Eliquis Sunday morning, Dec 5 (5 days post stroke), stop aspirin at that time.    Hypertension  Home meds:  diltiazem CD 360, lasix 20 bid, losartan 25, spironolactone 25  Stable  Off cleviprex  Resumed Cardizem, Lasix and spironolactone  BP goal now normotensive  Hyperlipidemia  Home meds:  crestor 20  Resume Crestor 20  LDL 80, goal < 70  Continue  statin at discharge  Dysphagia  Secondary to stroke  NPO -> dys 3 thin   Other Stroke Risk Factors  Advanced Age >/= 63   Morbid Obesity, Body mass index is 45.49 kg/m., BMI >/= 30 associated with increased stroke risk, recommend weight loss, diet and exercise as appropriate   Obstructive sleep apnea, on CPAP at home  Coronary Artery Disease  Systolic Congestive heart failure (HFmrEF)  NICM, EF 40-45%  Hx DVT  PAD  Other Active Problems  Anxiety On xanax 0.5 bid PTA. Resumed  COPD w/o exacerbation -> off IVF, resume breathing treatment as needed  GERD  DISCHARGE EXAM Blood pressure (!) 145/59, pulse (!) 51, temperature 98.3 F (36.8 C), temperature source Oral, resp. rate 20, height 5\' 4"  (1.626 m), weight 120.2 kg, SpO2 97 %. General - Well nourished, well developed, in no apparent distress.  Ophthalmologic - fundi not visualized due to noncooperation.  Cardiovascular - irregularly irregular heart rate and rhythm  Mental Status -  Level of arousal and orientation to time, place, and person were intact. Language including expression, naming, repetition, comprehension was assessed and found intact. Fund of Knowledge was assessed and was intact.  Cranial Nerves II - XII - II - Visual field intact OU. III, IV, VI - Extraocular movements intact. V - Facial sensation intact bilaterally. VII - mild left lower facial weakness. VIII - Hearing & vestibular intact bilaterally. X - Palate elevates symmetrically. XI - Chin turning & shoulder shrug intact bilaterally. XII - Tongue protrusion intact.  Motor Strength - The patient's strength was normal in right UE and LE, however, LUE proximal 3-/5, bicep 4/5 and tricep 4/5 with finger grip 4+/5. LLE 5-/5 proximal and 5/5 distal.  Bulk was normal and fasciculations were absent.   Motor Tone - Muscle tone was assessed at the neck and appendages and was normal.  Reflexes - The patient's reflexes were symmetrical  in all extremities and she had no pathological reflexes.  Sensory - Light touch, temperature/pinprick were assessed and were symmetrical.    Coordination - The patient had normal movements in the hands with no ataxia or dysmetria although left FTN is very slow and hard to complete but no ataxia.  Tremor was absent.  Gait and Station - deferred.  Discharge Diet   Dysphagia 3 thin liquids  DISCHARGE PLAN  Disposition:  Return home  Home health PT, OT SLP  aspirin 325 mg daily for secondary stroke prevention through Sat Dec 4th. Resume Eliquis Sunday, Dec 5 (5 days post stroke) and stop aspirin at that time.   Ongoing stroke risk factor control by Primary Care Physician at time of discharge  Follow-up PCP [name], MD in 2 weeks.  Follow-up in [facility] in 4 weeks, office to schedule an appointment.   Follow-up Sanjeev [facility]) Estanislado Pandy, MD (Interventional Neuroradiologist) in 4 weeks, office to schedule an appointment.   35 minutes were spent preparing discharge. I had long discussion with pt and husband at bedside, updated treatment plan and encourage [facility] PT/OT, and answered all the questions. They expressed understanding and appreciation.   Rosalin Hawking, MD PhD Stroke Neurology 11/23/2020 7:45 PM

## 2020-11-24 DIAGNOSIS — Z86718 Personal history of other venous thrombosis and embolism: Secondary | ICD-10-CM | POA: Diagnosis not present

## 2020-11-24 DIAGNOSIS — I69312 Visuospatial deficit and spatial neglect following cerebral infarction: Secondary | ICD-10-CM | POA: Diagnosis not present

## 2020-11-24 DIAGNOSIS — E785 Hyperlipidemia, unspecified: Secondary | ICD-10-CM | POA: Diagnosis not present

## 2020-11-24 DIAGNOSIS — R1312 Dysphagia, oropharyngeal phase: Secondary | ICD-10-CM | POA: Diagnosis not present

## 2020-11-24 DIAGNOSIS — I739 Peripheral vascular disease, unspecified: Secondary | ICD-10-CM | POA: Diagnosis not present

## 2020-11-24 DIAGNOSIS — I69354 Hemiplegia and hemiparesis following cerebral infarction affecting left non-dominant side: Secondary | ICD-10-CM | POA: Diagnosis not present

## 2020-11-24 DIAGNOSIS — Z9981 Dependence on supplemental oxygen: Secondary | ICD-10-CM | POA: Diagnosis not present

## 2020-11-24 DIAGNOSIS — K219 Gastro-esophageal reflux disease without esophagitis: Secondary | ICD-10-CM | POA: Diagnosis not present

## 2020-11-24 DIAGNOSIS — I110 Hypertensive heart disease with heart failure: Secondary | ICD-10-CM | POA: Diagnosis not present

## 2020-11-24 DIAGNOSIS — I2510 Atherosclerotic heart disease of native coronary artery without angina pectoris: Secondary | ICD-10-CM | POA: Diagnosis not present

## 2020-11-24 DIAGNOSIS — I69391 Dysphagia following cerebral infarction: Secondary | ICD-10-CM | POA: Diagnosis not present

## 2020-11-24 DIAGNOSIS — Z79891 Long term (current) use of opiate analgesic: Secondary | ICD-10-CM | POA: Diagnosis not present

## 2020-11-24 DIAGNOSIS — I69392 Facial weakness following cerebral infarction: Secondary | ICD-10-CM | POA: Diagnosis not present

## 2020-11-24 DIAGNOSIS — I428 Other cardiomyopathies: Secondary | ICD-10-CM | POA: Diagnosis not present

## 2020-11-24 DIAGNOSIS — I272 Pulmonary hypertension, unspecified: Secondary | ICD-10-CM | POA: Diagnosis not present

## 2020-11-24 DIAGNOSIS — Z7901 Long term (current) use of anticoagulants: Secondary | ICD-10-CM | POA: Diagnosis not present

## 2020-11-24 DIAGNOSIS — Z9181 History of falling: Secondary | ICD-10-CM | POA: Diagnosis not present

## 2020-11-24 DIAGNOSIS — J449 Chronic obstructive pulmonary disease, unspecified: Secondary | ICD-10-CM | POA: Diagnosis not present

## 2020-11-24 DIAGNOSIS — I69328 Other speech and language deficits following cerebral infarction: Secondary | ICD-10-CM | POA: Diagnosis not present

## 2020-11-24 DIAGNOSIS — I482 Chronic atrial fibrillation, unspecified: Secondary | ICD-10-CM | POA: Diagnosis not present

## 2020-11-24 DIAGNOSIS — I5022 Chronic systolic (congestive) heart failure: Secondary | ICD-10-CM | POA: Diagnosis not present

## 2020-11-24 DIAGNOSIS — Z6841 Body Mass Index (BMI) 40.0 and over, adult: Secondary | ICD-10-CM | POA: Diagnosis not present

## 2020-11-26 ENCOUNTER — Telehealth: Payer: Self-pay | Admitting: Family

## 2020-11-27 ENCOUNTER — Telehealth: Payer: Self-pay

## 2020-11-27 DIAGNOSIS — R1312 Dysphagia, oropharyngeal phase: Secondary | ICD-10-CM | POA: Diagnosis not present

## 2020-11-27 DIAGNOSIS — I69391 Dysphagia following cerebral infarction: Secondary | ICD-10-CM | POA: Diagnosis not present

## 2020-11-27 DIAGNOSIS — I69328 Other speech and language deficits following cerebral infarction: Secondary | ICD-10-CM | POA: Diagnosis not present

## 2020-11-27 DIAGNOSIS — I69312 Visuospatial deficit and spatial neglect following cerebral infarction: Secondary | ICD-10-CM | POA: Diagnosis not present

## 2020-11-27 DIAGNOSIS — I69392 Facial weakness following cerebral infarction: Secondary | ICD-10-CM | POA: Diagnosis not present

## 2020-11-27 DIAGNOSIS — I69354 Hemiplegia and hemiparesis following cerebral infarction affecting left non-dominant side: Secondary | ICD-10-CM | POA: Diagnosis not present

## 2020-11-27 NOTE — Telephone Encounter (Signed)
RX refill sent in

## 2020-11-27 NOTE — Telephone Encounter (Signed)
Urban Gibson,   Ok to refill lasix 20 mg once daily? I'm assuming so, but wanted to double check.  He med list didn't pull over into your last note that I could see.   Thanks!

## 2020-11-27 NOTE — Telephone Encounter (Signed)
Gave verbal order  To [facility] [phone]) For Physical therapy  1 times a for 1 week ,2 times a week 8 weeks and once a week for 1 week

## 2020-11-27 NOTE — Telephone Encounter (Signed)
Okay to refill.  Sometimes if I don't hit "refresh" before I sign my note the med list doesn't pull over - I addended it so its fixed. Thanks for letting me know  Loel Dubonnet, NP

## 2020-11-27 NOTE — Telephone Encounter (Signed)
Pt taking furosemide 20 mg qd. Please advise if ok to refill historical provider.

## 2020-11-28 ENCOUNTER — Other Ambulatory Visit: Payer: Self-pay | Admitting: Hospice and Palliative Medicine

## 2020-11-28 ENCOUNTER — Telehealth: Payer: Self-pay

## 2020-11-28 ENCOUNTER — Ambulatory Visit: Payer: Medicare Other

## 2020-11-28 MED ORDER — PROMETHAZINE HCL 12.5 MG PO TABS: 12.5000 mg | ORAL_TABLET | Freq: Two times a day (BID) | ORAL | 0 refills | Status: DC | PRN

## 2020-11-28 NOTE — Telephone Encounter (Signed)
Gave verbal to [facility] jennifer [phone] therapy once a week for 5 weeks for dysphagia

## 2020-11-28 NOTE — Progress Notes (Signed)
Patient called complaining of nausea and lack of appetite due to symptoms. She was discharged 12/03 due to ischemic stroke. She was having these same symptoms while she was hospitalized, discharged with Zofran. She is not having relief of her symptoms with Zofran. Script sent for 12.5 mg of phenergan as needed every 12 hours. Scheduled to be seen in office 12/14.

## 2020-11-29 ENCOUNTER — Telehealth: Payer: Self-pay

## 2020-11-29 DIAGNOSIS — I69328 Other speech and language deficits following cerebral infarction: Secondary | ICD-10-CM | POA: Diagnosis not present

## 2020-11-29 DIAGNOSIS — I69312 Visuospatial deficit and spatial neglect following cerebral infarction: Secondary | ICD-10-CM | POA: Diagnosis not present

## 2020-11-29 DIAGNOSIS — I69354 Hemiplegia and hemiparesis following cerebral infarction affecting left non-dominant side: Secondary | ICD-10-CM | POA: Diagnosis not present

## 2020-11-29 DIAGNOSIS — I69392 Facial weakness following cerebral infarction: Secondary | ICD-10-CM | POA: Diagnosis not present

## 2020-11-29 DIAGNOSIS — I69391 Dysphagia following cerebral infarction: Secondary | ICD-10-CM | POA: Diagnosis not present

## 2020-11-29 DIAGNOSIS — R1312 Dysphagia, oropharyngeal phase: Secondary | ICD-10-CM | POA: Diagnosis not present

## 2020-11-29 NOTE — Telephone Encounter (Signed)
Gave verbal to [facility] [phone] for social worker evaluation

## 2020-11-29 NOTE — Telephone Encounter (Signed)
Gave verbal order for social worker evaluation for facility to [place] [phone]

## 2020-11-30 ENCOUNTER — Encounter (HOSPITAL_COMMUNITY): Payer: Self-pay | Admitting: Emergency Medicine

## 2020-11-30 ENCOUNTER — Emergency Department (HOSPITAL_COMMUNITY)
Admission: EM | Admit: 2020-11-30 | Discharge: 2020-12-01 | Disposition: A | Discharge status: Home / Self Care | Payer: Medicare Other | Attending: Emergency Medicine | Admitting: Emergency Medicine

## 2020-11-30 ENCOUNTER — Other Ambulatory Visit: Payer: Self-pay

## 2020-11-30 DIAGNOSIS — R5383 Other fatigue: Secondary | ICD-10-CM | POA: Insufficient documentation

## 2020-11-30 DIAGNOSIS — R630 Anorexia: Secondary | ICD-10-CM | POA: Insufficient documentation

## 2020-11-30 DIAGNOSIS — Z5321 Procedure and treatment not carried out due to patient leaving prior to being seen by health care provider: Secondary | ICD-10-CM | POA: Diagnosis not present

## 2020-11-30 DIAGNOSIS — R110 Nausea: Secondary | ICD-10-CM | POA: Insufficient documentation

## 2020-11-30 DIAGNOSIS — R531 Weakness: Secondary | ICD-10-CM | POA: Diagnosis not present

## 2020-11-30 HISTORY — DX: Cerebral infarction, unspecified: I63.9

## 2020-11-30 LAB — CBC
HCT: 44.7 % (ref 36.0–46.0)
Hemoglobin: 15 g/dL (ref 12.0–15.0)
MCH: 30.4 pg (ref 26.0–34.0)
MCHC: 33.6 g/dL (ref 30.0–36.0)
MCV: 90.5 fL (ref 80.0–100.0)
Platelets: 349 10*3/uL (ref 150–400)
RBC: 4.94 MIL/uL (ref 3.87–5.11)
RDW: 14.1 % (ref 11.5–15.5)
WBC: 7.2 10*3/uL (ref 4.0–10.5)
nRBC: 0 % (ref 0.0–0.2)

## 2020-11-30 MED ORDER — ONDANSETRON 4 MG PO TBDP
4.0000 mg | ORAL_TABLET | Freq: Once | ORAL | Status: AC
  Administered 2020-11-30: 4 mg via ORAL
  Filled 2020-11-30: qty 1

## 2020-11-30 NOTE — ED Triage Notes (Addendum)
Patient arrived with EMS from home reports generalized weakness/fatigue with nausea and poor appetite this evening , she received Zofran 4 mg IV by EMS .

## 2020-12-01 ENCOUNTER — Emergency Department
Admission: EM | Admit: 2020-12-01 | Discharge: 2020-12-01 | Disposition: A | Discharge status: Home / Self Care | Payer: Medicare Other | Attending: Emergency Medicine | Admitting: Emergency Medicine

## 2020-12-01 ENCOUNTER — Encounter: Payer: Self-pay | Admitting: Emergency Medicine

## 2020-12-01 DIAGNOSIS — I5022 Chronic systolic (congestive) heart failure: Secondary | ICD-10-CM | POA: Insufficient documentation

## 2020-12-01 DIAGNOSIS — I110 Hypertensive heart disease with heart failure: Secondary | ICD-10-CM | POA: Insufficient documentation

## 2020-12-01 DIAGNOSIS — R110 Nausea: Secondary | ICD-10-CM | POA: Diagnosis not present

## 2020-12-01 DIAGNOSIS — J449 Chronic obstructive pulmonary disease, unspecified: Secondary | ICD-10-CM | POA: Insufficient documentation

## 2020-12-01 DIAGNOSIS — R531 Weakness: Secondary | ICD-10-CM

## 2020-12-01 DIAGNOSIS — Z7951 Long term (current) use of inhaled steroids: Secondary | ICD-10-CM | POA: Insufficient documentation

## 2020-12-01 DIAGNOSIS — Z79899 Other long term (current) drug therapy: Secondary | ICD-10-CM | POA: Insufficient documentation

## 2020-12-01 DIAGNOSIS — Z7901 Long term (current) use of anticoagulants: Secondary | ICD-10-CM | POA: Insufficient documentation

## 2020-12-01 DIAGNOSIS — I4891 Unspecified atrial fibrillation: Secondary | ICD-10-CM | POA: Diagnosis not present

## 2020-12-01 DIAGNOSIS — E860 Dehydration: Secondary | ICD-10-CM

## 2020-12-01 DIAGNOSIS — I509 Heart failure, unspecified: Secondary | ICD-10-CM

## 2020-12-01 DIAGNOSIS — I69998 Other sequelae following unspecified cerebrovascular disease: Secondary | ICD-10-CM

## 2020-12-01 LAB — COMPREHENSIVE METABOLIC PANEL
ALT: 11 U/L (ref 0–44)
ALT: 14 U/L (ref 0–44)
AST: 22 U/L (ref 15–41)
AST: 26 U/L (ref 15–41)
Albumin: 3.4 g/dL — ABNORMAL LOW (ref 3.5–5.0)
Albumin: 4.1 g/dL (ref 3.5–5.0)
Alkaline Phosphatase: 50 U/L (ref 38–126)
Alkaline Phosphatase: 62 U/L (ref 38–126)
Anion gap: 11 (ref 5–15)
Anion gap: 15 (ref 5–15)
BUN: 15 mg/dL (ref 8–23)
BUN: 24 mg/dL — ABNORMAL HIGH (ref 8–23)
CO2: 26 mmol/L (ref 22–32)
CO2: 24 mmol/L (ref 22–32)
Calcium: 9.6 mg/dL (ref 8.9–10.3)
Calcium: 10 mg/dL (ref 8.9–10.3)
Chloride: 96 mmol/L — ABNORMAL LOW (ref 98–111)
Chloride: 96 mmol/L — ABNORMAL LOW (ref 98–111)
Creatinine, Ser: 1.18 mg/dL — ABNORMAL HIGH (ref 0.44–1.00)
Creatinine, Ser: 1.79 mg/dL — ABNORMAL HIGH (ref 0.44–1.00)
GFR, Estimated: 50 mL/min — ABNORMAL LOW (ref >60)
GFR, Estimated: 30 mL/min — ABNORMAL LOW (ref >60)
Glucose, Bld: 106 mg/dL — ABNORMAL HIGH (ref 70–99)
Glucose, Bld: 127 mg/dL — ABNORMAL HIGH (ref 70–99)
Potassium: 3.8 mmol/L (ref 3.5–5.1)
Potassium: 4.3 mmol/L (ref 3.5–5.1)
Sodium: 133 mmol/L — ABNORMAL LOW (ref 135–145)
Sodium: 135 mmol/L (ref 135–145)
Total Bilirubin: 1.3 mg/dL — ABNORMAL HIGH (ref 0.3–1.2)
Total Bilirubin: 1.4 mg/dL — ABNORMAL HIGH (ref 0.3–1.2)
Total Protein: 6.5 g/dL (ref 6.5–8.1)
Total Protein: 8.2 g/dL — ABNORMAL HIGH (ref 6.5–8.1)

## 2020-12-01 LAB — CBC WITH DIFFERENTIAL/PLATELET
Abs Immature Granulocytes: 0.07 10*3/uL (ref 0.00–0.07)
Basophils Absolute: 0.1 10*3/uL (ref 0.0–0.1)
Basophils Relative: 1 %
Eosinophils Absolute: 0.2 10*3/uL (ref 0.0–0.5)
Eosinophils Relative: 2 %
HCT: 46.9 % — ABNORMAL HIGH (ref 36.0–46.0)
Hemoglobin: 15.7 g/dL — ABNORMAL HIGH (ref 12.0–15.0)
Immature Granulocytes: 1 %
Lymphocytes Relative: 20 %
Lymphs Abs: 1.9 10*3/uL (ref 0.7–4.0)
MCH: 30.1 pg (ref 26.0–34.0)
MCHC: 33.5 g/dL (ref 30.0–36.0)
MCV: 90 fL (ref 80.0–100.0)
Monocytes Absolute: 1 10*3/uL (ref 0.1–1.0)
Monocytes Relative: 10 %
Neutro Abs: 6.2 10*3/uL (ref 1.7–7.7)
Neutrophils Relative %: 66 %
Platelets: 379 10*3/uL (ref 150–400)
RBC: 5.21 MIL/uL — ABNORMAL HIGH (ref 3.87–5.11)
RDW: 14 % (ref 11.5–15.5)
WBC: 9.3 10*3/uL (ref 4.0–10.5)
nRBC: 0 % (ref 0.0–0.2)

## 2020-12-01 LAB — LIPASE, BLOOD
Lipase: 33 U/L (ref 11–51)
Lipase: 35 U/L (ref 11–51)

## 2020-12-01 MED ORDER — ONDANSETRON 4 MG PO TBDP
ORAL_TABLET | ORAL | Status: AC
  Filled 2020-12-01: qty 1

## 2020-12-01 MED ORDER — SODIUM CHLORIDE 0.9 % IV BOLUS
1000.0000 mL | Freq: Once | INTRAVENOUS | Status: AC
  Administered 2020-12-01: 1000 mL via INTRAVENOUS

## 2020-12-01 MED ORDER — ONDANSETRON 4 MG PO TBDP
4.0000 mg | ORAL_TABLET | Freq: Once | ORAL | Status: AC
  Administered 2020-12-01: 4 mg via ORAL

## 2020-12-01 MED ORDER — TRAMADOL HCL 50 MG PO TABS
50.0000 mg | ORAL_TABLET | Freq: Once | ORAL | Status: AC
  Administered 2020-12-01: 50 mg via ORAL
  Filled 2020-12-01: qty 1

## 2020-12-01 MED ORDER — ACETAMINOPHEN 500 MG PO TABS
1000.0000 mg | ORAL_TABLET | Freq: Once | ORAL | Status: AC
  Administered 2020-12-01: 1000 mg via ORAL
  Filled 2020-12-01: qty 2

## 2020-12-01 NOTE — ED Notes (Signed)
Pt daughter came to pick up pt and take her to another hospital.

## 2020-12-01 NOTE — ED Notes (Signed)
AAOx3.  Skin warm and dry.  NAD 

## 2020-12-01 NOTE — ED Provider Notes (Signed)
[facility] Emergency Department Provider Note  ____________________________________________  Time seen: Approximately 4:50 PM  I have reviewed the triage vital signs and the nursing notes.   HISTORY  Chief Complaint Nausea    HPI Laura [name] is a 69 y.o. female with a history of chronic atrial fibrillation on Eliquis, COPD, GERD, pulmonary hypertension, systolic heart failure on spironolactone and Lasix who comes the ED complaining of nausea and generalized weakness.  Patient has been in her usual state of health, reports that she recently had a stroke that she thinks was provoked by holding her Eliquis for several days while having a spinal injection done for chronic back pain.  She was discharged from the hospital on December 3.  She went home with home PT, and since then has had poor appetite, persistent nausea, difficulty with eating and drinking.  She has been working with home social work to obtain rehab facility placement since her symptoms are preventing her from progressing adequately in her post stroke rehab care.  Denies any acute symptoms such as chest pain or shortness of breath, no dizziness or syncope, no vomiting or diarrhea.  Denies acute pain, has only mild residual weakness from the stroke and nothing acute.  She has been holding her spironolactone and Lasix recently due to the poor oral intake.  She estimates that she is about 20 pounds lower than her usual weight recently and doubts any degree of volume overload.    Past Medical History:  Diagnosis Date  . Asthma   . Chronic atrial fibrillation ([place])    a. diagnosed in 09/2016; b. failed flecainide and propafenone due to LE swelling and SOB, could not afford Multaq; c. CHADS2VASc => 5 (CHF, HTN, age x 1, nonobs CAD, female)--> Eliquis  . COPD (chronic obstructive pulmonary disease) ([place])   . GERD (gastroesophageal reflux disease)   . HFmrEF (heart failure with mid-range ejection  fraction) ([place])    a. 12/2019 Echo: EF 40-45%.  . Hyperlipidemia   . Hypertension   . NICM (nonischemic cardiomyopathy) ([place])    a. 12/2019 Echo: EF 40-45%, glob HK, mildly reduced RV fxn, sev dil LA. *HR 130 (afib) during study.  . Nonobstructive CAD (coronary artery disease)    a. Lexiscan Myoview 10/2016: no evidence of ischemia, EF 53%; b. 02/2020 Cath: LM nl, LAD 20p, 56m, LCX 20ost, OM1/2/3 nl, LPDA nl, LPL1/2 nl, LPAV nl, RCA small, nl.  . Obesity   . Obstructive sleep apnea   . Pulmonary hypertension ([place])   . Stroke ([place])   . Systolic dysfunction    a. TTE 10/2016: EF 50%, mild LVH, moderately dilated LA, moderate MR/TR, mild pulmonary hypertension     Patient Active Problem List   Diagnosis Date Noted  . Hyperlipidemia LDL goal <70 11/23/2020  . NICM (nonischemic cardiomyopathy) ([place])   . Acute R MCA ischemic stroke [facility]) s/p revascularization R M1, embolic d/t known AF 01/75/1025  . Middle cerebral artery embolism, right 11/20/2020  . Lymphedema 06/07/2020  . Acute right-sided low back pain with right-sided sciatica 05/21/2020  . Chronic systolic heart failure ([place])   . Pulmonary hypertension, unspecified ([place])   . Nutritional deficiency 01/02/2020  . Memory deficit 01/02/2020  . Encounter for therapeutic drug level monitoring 01/02/2020  . B12 deficiency 01/02/2020  . PAD (peripheral artery disease) ([place]) 12/05/2019  . Intractable vomiting 10/09/2019  . Intestinal ischemia ([place])   . COPD with acute exacerbation ([place])   . Atrial fibrillation with rapid ventricular response (  [place]) 09/11/2019  . Enteritis   . Peripheral edema 07/17/2019  . Atherosclerosis of both carotid arteries 07/17/2019  . Varicose veins of right lower extremity with inflammation 06/08/2019  . Gastroesophageal reflux disease with esophagitis 05/08/2019  . Fall at home, initial encounter 03/16/2019  . Contusion of right lower leg 03/16/2019  . Flu-like symptoms 02/03/2019  . Nausea 02/03/2019  .  Suprapatellar bursitis of right knee 01/23/2019  . Pain in right leg 01/23/2019  . Cellulitis of right leg 01/23/2019  . Chronic venous stasis dermatitis of right lower extremity 01/23/2019  . Oropharyngeal candidiasis 01/23/2019  . Cellulitis of head except face 12/23/2018  . Screening for breast cancer 08/15/2018  . Chronic bronchitis with acute exacerbation ([place]) 08/15/2018  . Vitamin D deficiency 08/15/2018  . Need for vaccination against Streptococcus pneumoniae using pneumococcal conjugate vaccine 13 08/15/2018  . Obstructive chronic bronchitis without exacerbation ([place]) 07/14/2018  . Morbid obesity ([place]) 03/17/2018  . Asthma 02/25/2018  . Generalized anxiety disorder 12/16/2017  . Essential hypertension 12/16/2017  . Chronic respiratory failure with hypoxia ([place]) 12/16/2017  . Long term (current) use of anticoagulants 12/16/2017  . Chronic atrial fibrillation ([place]) 12/16/2017  . Other specified functional intestinal disorders 12/16/2017  . Gastro-esophageal reflux disease without esophagitis 12/16/2017  . OSA on CPAP 12/16/2017  . Allergic rhinitis due to animal (cat) (dog) hair and dander 12/16/2017  . Allergic rhinitis due to pollen 12/16/2017  . Severe persistent asthma without complication 13/24/4010  . Cough 12/16/2017  . Shortness of breath 12/16/2017  . Superficial thrombophlebitis of right leg 11/17/2016  . Pain in limb 11/17/2016  . Chronic venous insufficiency 11/17/2016  . Swelling of limb 11/17/2016  . Cellulitis of buttock   . Cellulitis of right axilla   . Sepsis ([place]) 06/28/2016  . Cellulitis and abscess 06/28/2016  . Hypokalemia 06/28/2016  . Acute renal insufficiency 06/28/2016  . Hyperglycemia 06/28/2016  . Leukocytosis 06/28/2016  . Hypoxia 06/28/2016  . Collagenous colitis 05/13/2016     Past Surgical History:  Procedure Laterality Date  . BOWEL RESECTION  09/11/2019   Procedure: SMALL BOWEL RESECTION;  Surgeon: Herbert Pun, MD;   Location: [facility] ORS;  Service: General;;  . CARDIAC CATHETERIZATION    . cataract surgery    . COLONOSCOPY WITH PROPOFOL N/A 03/19/2020   Procedure: COLONOSCOPY WITH PROPOFOL;  Surgeon: [name], MD;  [facility] END[facility]COPY;  Service: Gastroenterology;  Laterality: N/A;  . CORONARY ANGIOPLASTY    . INCISION AND DRAINAGE ABSCESS Right 06/29/2016   Procedure: INCISION AND DRAINAGE ABSCESS;  Surgeon: Florene Glen, MD;  Location: [facility] ORS;  Service: General;  Laterality: Right;  . INCISION AND DRAINAGE OF WOUND Left 06/29/2016   Procedure: IRRIGATION AND DEBRIDEMENT WOUND;  Surgeon: Florene Glen, MD;  Location: [facility] ORS;  Service: General;  Laterality: Left;  . IR ANGIO VERTEBRAL SEL SUBCLAVIAN INNOMINATE UNI R MOD SED  11/20/2020  . IR CT HEAD LTD  11/20/2020  . IR PERCUTANEOUS ART THROMBECTOMY/INFUSION INTRACRANIAL INC DIAG ANGIO  11/20/2020  . LAPAROSCOPIC RIGHT COLECTOMY  09/11/2019   Procedure: RIGHT COLECTOMY;  Surgeon: Herbert Pun, MD;  Location: [facility] ORS;  Service: General;;  . LAPAROSCOPY N/A 09/11/2019   Procedure: LAPAROSCOPY DIAGNOSTIC;  Surgeon: Herbert Pun, MD;  Location: [facility] ORS;  Service: General;  Laterality: N/A;  . LAPAROTOMY N/A 09/13/2019   Procedure: REOPENING OF RECENT LAPAROTOMYANASTOMOSIS OF BOWEL;  Surgeon: Herbert Pun, MD;  Location: [facility] ORS;  Service: General;  Laterality: N/A;  . RADIOLOGY WITH  ANESTHESIA N/A 11/20/2020   Procedure: IR WITH ANESTHESIA - CODE STROKE;  Surgeon: Radiologist, Medication, MD;  Location: [place];  Service: Radiology;  Laterality: N/A;  . RIGHT/LEFT HEART CATH AND CORONARY ANGIOGRAPHY Bilateral 02/27/2020   Procedure: RIGHT/LEFT HEART CATH AND CORONARY ANGIOGRAPHY;  Surgeon: Wellington Hampshire, MD;  Location: [place] CV LAB;  Service: Cardiovascular;  Laterality: Bilateral;  . VISCERAL ANGIOGRAPHY N/A 09/12/2019   Procedure: VISCERAL ANGIOGRAPHY;  Surgeon: Algernon Huxley, MD;  Location: [place] CV LAB;   Service: Cardiovascular;  Laterality: N/A;     Prior to Admission medications   Medication Sig Start Date End Date Taking? Authorizing Provider  Accu-Chek Softclix Lancets lancets Use as instructed to check blood sugars daily 2 hours after meal 10/25/20   [name], MD  albuterol (VENTOLIN HFA) 108 (90 Base) MCG/ACT inhaler Inhale 2 puffs into the lungs every 4 (four) hours as needed for wheezing or shortness of breath. 03/28/20   Ronnell Freshwater, NP  ALPRAZolam (XANAX) 0.25 MG tablet Take 1 tablet (0.25 mg total) by mouth 2 (two) times daily. Patient taking differently: Take 0.25 mg by mouth 2 (two) times daily as needed for anxiety.  11/02/20   Luiz Ochoa, NP  apixaban (ELIQUIS) 5 MG TABS tablet Take 1 tablet (5 mg total) by mouth 2 (two) times daily. 11/25/20   Donzetta Starch, NP  benzonatate (TESSALON) 100 MG capsule Take 1 capsule (100 mg total) by mouth 2 (two) times daily as needed for cough. 04/03/20   Scarboro, Audie Clear, NP  budesonide (PULMICORT) 0.25 MG/2ML nebulizer solution Take 2 mLs (0.25 mg total) by nebulization 2 (two) times daily. 02/02/20   Scarboro, Audie Clear, NP  cyanocobalamin (,VITAMIN B-12,) 1000 MCG/ML injection Inject 1,000 mcg into the muscle every 30 (thirty) days.    [provider]  digoxin (LANOXIN) 0.125 MG tablet Take 1 tablet (0.125 mg total) by mouth daily. 03/20/20   Loel Dubonnet, NP  diltiazem (CARDIZEM CD) 360 MG 24 hr capsule Take 1 capsule (360 mg total) by mouth daily. 03/20/20   Loel Dubonnet, NP  EPINEPHrine 0.3 mg/0.3 mL IJ SOAJ injection Inject 0.3 mg into the muscle as needed for anaphylaxis.     [provider]  furosemide (LASIX) 20 MG tablet TAKE 1 TABLET BY MOUTH DAILY. (GENERIC FOR LASIX) 11/27/20   Loel Dubonnet, NP  glucose blood (ACCU-CHEK GUIDE) test strip Use as instructed to check blood sugars daily 2 hours after meal 10/25/20   [name], MD  Ibuprofen-diphenhydrAMINE HCl (ADVIL PM) 200-25 MG CAPS Take 2  tablets by mouth at bedtime as needed (sleep).    [provider]  ipratropium-albuterol (DUONEB) 0.5-2.5 (3) MG/3ML SOLN Take 3 mLs by nebulization every 4 (four) hours as needed. 03/28/20   Ronnell Freshwater, NP  loperamide (IMODIUM) 2 MG capsule Take 4 mg by mouth as needed for diarrhea or loose stools.    [provider]  losartan (COZAAR) 25 MG tablet Take 1 tablet (25 mg total) by mouth daily. 03/28/20   Ronnell Freshwater, NP  montelukast (SINGULAIR) 10 MG tablet Take 1 tablet (10 mg total) by mouth daily. 03/28/20   Ronnell Freshwater, NP  ondansetron (ZOFRAN) 4 MG tablet Take 1 tablet (4 mg total) by mouth every 8 (eight) hours as needed for nausea or vomiting. 07/02/20   [name], MD  Probiotic Product (PROBIOTIC-10) CAPS Take 1 capsule by mouth daily.  [provider]  promethazine (PHENERGAN) 12.5 MG tablet Take 1 tablet (12.5 mg total) by mouth every 12 (twelve) hours as needed for nausea or vomiting. 11/28/20   Luiz Ochoa, NP  spironolactone (ALDACTONE) 25 MG tablet Take 25 mg by mouth 2 (two) times daily.     [provider]  theophylline (THEODUR) 300 MG 12 hr tablet Take 1 tablet (300 mg total) by mouth 2 (two) times daily. 03/28/20   Ronnell Freshwater, NP  traMADol (ULTRAM) 50 MG tablet Take 50 mg by mouth every 6 (six) hours as needed for moderate pain.  08/13/20   [provider]     Allergies Flecainide, Metoprolol, Propafenone, and Rivaroxaban   Family History  Problem Relation Age of Onset  . Dementia Mother   . Osteoporosis Mother   . Vascular Disease Mother   . COPD Father     Social History Social History   Tobacco Use  . Smoking status: Never Smoker  . Smokeless tobacco: Never Used  Vaping Use  . Vaping Use: Never used  Substance Use Topics  . Alcohol use: No  . Drug use: No    Review of Systems  Constitutional:   No fever or chills.  ENT:   No sore throat. No rhinorrhea. Cardiovascular:   No chest  pain or syncope. Respiratory:   No dyspnea or cough. Gastrointestinal:   Negative for abdominal pain, vomiting and diarrhea.  Musculoskeletal:   Negative for focal pain or swelling All other systems reviewed and are negative except as documented above in ROS and HPI.  ____________________________________________   PHYSICAL EXAM:  VITAL SIGNS: ED Triage Vitals  Enc Vitals Group     BP 12/01/20 1044 101/77     Pulse Rate 12/01/20 1044 (!) 115     Resp 12/01/20 1044 18     Temp 12/01/20 1044 98.4 F (36.9 C)     Temp Source 12/01/20 1044 Oral     SpO2 12/01/20 1213 96 %     Weight 12/01/20 1042 268 lb 15.4 oz (122 kg)     Height 12/01/20 1042 5\' 4"  (1.626 m)     Head Circumference --      Peak Flow --      Pain Score 12/01/20 1041 10     Pain Loc --      Pain Edu? --      Excl. in [place]? --     Vital signs reviewed, nursing assessments reviewed.   Constitutional:   Alert and oriented. Non-toxic appearance. Eyes:   Conjunctivae are normal. EOMI. PERRL. ENT      Head:   Normocephalic and atraumatic.      Nose: Normal.      Mouth/Throat:   Dry mucous membranes      Neck:   No meningismus. Full ROM. Hematological/Lymphatic/Immunilogical:   No cervical lymphadenopathy. Cardiovascular:   Irregularly irregular rhythm. Symmetric bilateral radial and DP pulses.  No murmurs. Cap refill less than 2 seconds. Respiratory:   Normal respiratory effort without tachypnea/retractions. Breath sounds are clear and equal bilaterally. No wheezes/rales/rhonchi. Gastrointestinal:   Soft and nontender. Non distended. There is no CVA tenderness.  No rebound, rigidity, or guarding.  Musculoskeletal:   Normal range of motion in all extremities. No joint effusions.  No lower extremity tenderness.  No edema. Neurologic:   Normal speech and language.  Motor grossly intact. No acute focal neurologic deficits are appreciated.  Skin:    Skin is warm, dry and intact. No  rash noted.  No petechiae, purpura,  or bullae.  ____________________________________________    LABS (pertinent positives/negatives) (all labs ordered are listed, but only abnormal results are displayed) Labs Reviewed  CBC WITH DIFFERENTIAL/PLATELET - Abnormal; Notable for the following components:      Result Value   RBC 5.21 (*)    Hemoglobin 15.7 (*)    HCT 46.9 (*)    All other components within normal limits  COMPREHENSIVE METABOLIC PANEL - Abnormal; Notable for the following components:   Chloride 96 (*)    Glucose, Bld 127 (*)    BUN 24 (*)    Creatinine, Ser 1.79 (*)    Total Protein 8.2 (*)    Total Bilirubin 1.4 (*)    GFR, Estimated 30 (*)    All other components within normal limits  LIPASE, BLOOD   ____________________________________________   EKG    ____________________________________________    RADIOLOGY  No results found.  ____________________________________________   PROCEDURES Procedures  ____________________________________________    CLINICAL IMPRESSION / ASSESSMENT AND PLAN / ED COURSE  Medications ordered in the ED: Medications  ondansetron (ZOFRAN-ODT) 4 MG disintegrating tablet (  Not Given 12/01/20 1647)  acetaminophen (TYLENOL) tablet 1,000 mg (has no administration in time range)  traMADol (ULTRAM) tablet 50 mg (has no administration in time range)  ondansetron (ZOFRAN-ODT) disintegrating tablet 4 mg (4 mg Oral Given 12/01/20 1048)  sodium chloride 0.9 % bolus 1,000 mL (1,000 mLs Intravenous New Bag/Given 12/01/20 1103)    Pertinent labs & imaging results that were available during my care of the patient were reviewed by me and considered in my medical decision making (see chart for details).  ZANAE KUEHNLE was evaluated in Emergency Department on 12/01/2020 for the symptoms described in the history of present illness. She was evaluated in the context of the global COVID-19 pandemic, which necessitated consideration that the patient might be at risk for  infection with the SARS-CoV-2 virus that causes COVID-19. Institutional protocols and algorithms that pertain to the evaluation of patients at risk for COVID-19 are in a state of rapid change based on information released by regulatory bodies including the CDC and federal and state organizations. These policies and algorithms were followed during the patient's care in the ED.   Patient comes due to weakness in the setting of poor oral intake.  Appears dehydrated clinically, labs show mild increase in her creatinine above her baseline as well consistent with dehydration.  Patient was given 1 L IV fluid bolus.  On reassessment, she is still breathing comfortably, lungs clear to auscultation bilaterally, she feels better.  Vital signs are normal.  Patient given a dose of Tylenol and tramadol for her chronic back pain.  Offered additional IV fluids and to keep the patient in the ED for social work placement from the ED, but she declines and would rather go home now to her more comfortable home environment and allow home social work to proceed with rehab facility placement.  Return precautions discussed, hydration strategies discussed, recommended she continue to hold on spironolactone and Lasix until she has back to normal oral intake.      ____________________________________________   FINAL CLINICAL IMPRESSION(S) / ED DIAGNOSES    Final diagnoses:  Nausea  Dehydration  Atrial fibrillation, unspecified type (HCC)  Chronic congestive heart failure, unspecified heart failure type ([place])  Weakness due to old stroke     ED Discharge Orders    None      Portions of this note were  generated with [employer]. Dictation errors may occur despite best attempts at proofreading.   Carrie Mew, MD 12/01/20 [phone]

## 2020-12-01 NOTE — ED Notes (Signed)
Grand daughter Baltazar Najjar [phone] called in concerned abt dehydration notified triage nurse, triage nurse said pt was vomiting and has received meds

## 2020-12-01 NOTE — ED Triage Notes (Addendum)
Arrives with family with c/o nausea and poor appetite and chronic back pain.  Patient had initially presented to [facility] ED and left after waiting 12 hours, per patient.  Patient is AAOx3.  Skin warm and dry. NAD.  MAE to baseline.  Left sided weakness from recent stroke unchanged.

## 2020-12-02 DIAGNOSIS — I69312 Visuospatial deficit and spatial neglect following cerebral infarction: Secondary | ICD-10-CM | POA: Diagnosis not present

## 2020-12-02 DIAGNOSIS — I69328 Other speech and language deficits following cerebral infarction: Secondary | ICD-10-CM | POA: Diagnosis not present

## 2020-12-02 DIAGNOSIS — R1312 Dysphagia, oropharyngeal phase: Secondary | ICD-10-CM | POA: Diagnosis not present

## 2020-12-02 DIAGNOSIS — I69392 Facial weakness following cerebral infarction: Secondary | ICD-10-CM | POA: Diagnosis not present

## 2020-12-02 DIAGNOSIS — I69391 Dysphagia following cerebral infarction: Secondary | ICD-10-CM | POA: Diagnosis not present

## 2020-12-02 DIAGNOSIS — I69354 Hemiplegia and hemiparesis following cerebral infarction affecting left non-dominant side: Secondary | ICD-10-CM | POA: Diagnosis not present

## 2020-12-03 ENCOUNTER — Encounter: Payer: Self-pay | Admitting: Hospice and Palliative Medicine

## 2020-12-03 ENCOUNTER — Other Ambulatory Visit: Payer: Self-pay | Admitting: Hospice and Palliative Medicine

## 2020-12-03 DIAGNOSIS — I69312 Visuospatial deficit and spatial neglect following cerebral infarction: Secondary | ICD-10-CM | POA: Diagnosis not present

## 2020-12-03 DIAGNOSIS — I69354 Hemiplegia and hemiparesis following cerebral infarction affecting left non-dominant side: Secondary | ICD-10-CM | POA: Diagnosis not present

## 2020-12-03 DIAGNOSIS — I69391 Dysphagia following cerebral infarction: Secondary | ICD-10-CM | POA: Diagnosis not present

## 2020-12-03 DIAGNOSIS — R1312 Dysphagia, oropharyngeal phase: Secondary | ICD-10-CM | POA: Diagnosis not present

## 2020-12-03 DIAGNOSIS — I69392 Facial weakness following cerebral infarction: Secondary | ICD-10-CM | POA: Diagnosis not present

## 2020-12-03 DIAGNOSIS — I69328 Other speech and language deficits following cerebral infarction: Secondary | ICD-10-CM | POA: Diagnosis not present

## 2020-12-03 MED ORDER — AZITHROMYCIN 250 MG PO TABS: ORAL_TABLET | ORAL | 0 refills | Status: DC

## 2020-12-03 NOTE — Progress Notes (Signed)
Z-Pak sent to pharmacy, will schedule follow-up this week.

## 2020-12-04 ENCOUNTER — Telehealth: Payer: Self-pay

## 2020-12-04 ENCOUNTER — Ambulatory Visit (INDEPENDENT_AMBULATORY_CARE_PROVIDER_SITE_OTHER): Payer: Medicare Other | Admitting: Internal Medicine

## 2020-12-04 ENCOUNTER — Other Ambulatory Visit: Payer: Self-pay | Admitting: Internal Medicine

## 2020-12-04 ENCOUNTER — Encounter: Payer: Self-pay | Admitting: Internal Medicine

## 2020-12-04 ENCOUNTER — Other Ambulatory Visit: Payer: Self-pay

## 2020-12-04 VITALS — BP 128/78 | HR 70 | Temp 98.2°F | Resp 16 | Ht 64.0 in | Wt 243.0 lb

## 2020-12-04 DIAGNOSIS — Z7901 Long term (current) use of anticoagulants: Secondary | ICD-10-CM

## 2020-12-04 DIAGNOSIS — I69354 Hemiplegia and hemiparesis following cerebral infarction affecting left non-dominant side: Secondary | ICD-10-CM | POA: Diagnosis not present

## 2020-12-04 DIAGNOSIS — I69392 Facial weakness following cerebral infarction: Secondary | ICD-10-CM | POA: Diagnosis not present

## 2020-12-04 DIAGNOSIS — R110 Nausea: Secondary | ICD-10-CM

## 2020-12-04 DIAGNOSIS — R1312 Dysphagia, oropharyngeal phase: Secondary | ICD-10-CM | POA: Diagnosis not present

## 2020-12-04 DIAGNOSIS — I482 Chronic atrial fibrillation, unspecified: Secondary | ICD-10-CM | POA: Diagnosis not present

## 2020-12-04 DIAGNOSIS — I69312 Visuospatial deficit and spatial neglect following cerebral infarction: Secondary | ICD-10-CM | POA: Diagnosis not present

## 2020-12-04 DIAGNOSIS — I69391 Dysphagia following cerebral infarction: Secondary | ICD-10-CM | POA: Diagnosis not present

## 2020-12-04 DIAGNOSIS — I639 Cerebral infarction, unspecified: Secondary | ICD-10-CM

## 2020-12-04 DIAGNOSIS — I69328 Other speech and language deficits following cerebral infarction: Secondary | ICD-10-CM | POA: Diagnosis not present

## 2020-12-04 MED ORDER — GABAPENTIN 100 MG PO CAPS: 100.0000 mg | ORAL_CAPSULE | Freq: Three times a day (TID) | ORAL | 3 refills | Status: DC

## 2020-12-04 NOTE — Progress Notes (Signed)
[facility] [place], [place] [address]  Internal MEDICINE  Office Visit Note  Patient Name: Laura [name]  [id number]  [id number]  Date of Service: 12/04/2020     Chief Complaint  Patient presents with  . Hospitalization Follow-up    Pt had stroke on 11/20/2020 released from hospital 11/23/20  . Cough  . nausea    Pt not able to eat and was dehydrated went to ER by ambulance on 11/30/20 [facility] [place] and was never seen and daughter picked her up and brought to [facility].  Pt still has nausea, and still not eating or drinking that much.    . home heatlth    [facility] to help get rehab   HPI Pt is here for recent hospital follow up. She continues to have severe nausea after her discharge, daughter in the room with her, pt is in W/C, able to communicate well, is willing to go to rehab   1. Patchy acute right MCA territory infarcts with small volume subarachnoid hemorrhage. 2. Motion degraded head MRA. Patent right MCA following Revascularization.  Her hospital course is copied from Dr Rosalin Hawking ( Stroke Neurology)  M.CALIA NAPP a 69 y.o.femalewith history of chronic atrial fibrillation on Eliquis, COPD, gastroesophageal reflux disease, heart failure with midrange ejection fraction, hypertension, hyperlipidemia, nonischemic cardiomyopathy, obstructive sleep apnea, obesity, presenting with L sided weakness w/ R gaze preference, slurred speech and L facial droop. No tPA as on Eliquis. Found to have R MCA occlusion. Sent to IR.  Stroke:PatchyR MCA infarctsdue to right M1 occlusion s/p IR w/ TICI3 revascularization, infarct embolic secondary to known AF   Code Stroke CT head No acute abnormality. Dense R M1.ASPECTS 10.   CTA head &neck ELVO R M1. Mild ICA atherosclerosis. L subclavian origin 55% stenosis.  Cerebral angio / IR TICI3 revascularization R MCA  occlusion  Post IR CT small SAH post perisylvian fissurew/ contrast stain R parietal subcortical region  MRIpatchy R MCA territory infarcts w/ small SAH  MRApatent R MCA  2D EchoEF 45 to 50%  LDL80  HgbA1c6.2  Eliquis (apixaban) dailyprior to admission, now on aspirin 325. Resume Eliquis Sunday morning, Dec 5, stop aspirin at that time.   Therapy recommendations:HH OT, HH PT, HH SLP  Current Medication: Outpatient Encounter Medications as of 12/04/2020  Medication Sig  . Accu-Chek Softclix Lancets lancets Use as instructed to check blood sugars daily 2 hours after meal  . albuterol (VENTOLIN HFA) 108 (90 Base) MCG/ACT inhaler Inhale 2 puffs into the lungs every 4 (four) hours as needed for wheezing or shortness of breath.  . ALPRAZolam (XANAX) 0.25 MG tablet Take 1 tablet (0.25 mg total) by mouth 2 (two) times daily. (Patient taking differently: Take 0.25 mg by mouth 2 (two) times daily as needed for anxiety.)  . apixaban (ELIQUIS) 5 MG TABS tablet Take 1 tablet (5 mg total) by mouth 2 (two) times daily.  Marland Kitchen azithromycin (ZITHROMAX Z-PAK) 250 MG tablet Take two 250 mg tablets on day one followed by one 250 mg tablet each day for four days.  . benzonatate (TESSALON) 100 MG capsule Take 1 capsule (100 mg total) by mouth 2 (two) times daily as needed for cough.  . budesonide (PULMICORT) 0.25 MG/2ML nebulizer solution Take 2 mLs (0.25 mg total) by nebulization 2 (two) times daily.  . cyanocobalamin (,VITAMIN B-12,) 1000 MCG/ML injection Inject 1,000 mcg into the muscle  every 30 (thirty) days.  . digoxin (LANOXIN) 0.125 MG tablet Take 1 tablet (0.125 mg total) by mouth daily.  Marland Kitchen diltiazem (CARDIZEM CD) 360 MG 24 hr capsule Take 1 capsule (360 mg total) by mouth daily.  Marland Kitchen EPINEPHrine 0.3 mg/0.3 mL IJ SOAJ injection Inject 0.3 mg into the muscle as needed for anaphylaxis.   . furosemide (LASIX) 20 MG tablet TAKE 1 TABLET BY MOUTH DAILY. (GENERIC FOR LASIX)  . glucose blood  (ACCU-CHEK GUIDE) test strip Use as instructed to check blood sugars daily 2 hours after meal  . Ibuprofen-diphenhydrAMINE HCl (ADVIL PM) 200-25 MG CAPS Take 2 tablets by mouth at bedtime as needed (sleep).  Marland Kitchen ipratropium-albuterol (DUONEB) 0.5-2.5 (3) MG/3ML SOLN Take 3 mLs by nebulization every 4 (four) hours as needed.  . loperamide (IMODIUM) 2 MG capsule Take 4 mg by mouth as needed for diarrhea or loose stools.  Marland Kitchen losartan (COZAAR) 25 MG tablet Take 1 tablet (25 mg total) by mouth daily.  . montelukast (SINGULAIR) 10 MG tablet Take 1 tablet (10 mg total) by mouth daily.  . ondansetron (ZOFRAN) 4 MG tablet Take 1 tablet (4 mg total) by mouth every 8 (eight) hours as needed for nausea or vomiting.  . Probiotic Product (PROBIOTIC-10) CAPS Take 1 capsule by mouth daily.   . promethazine (PHENERGAN) 12.5 MG tablet Take 1 tablet (12.5 mg total) by mouth every 12 (twelve) hours as needed for nausea or vomiting.  Marland Kitchen spironolactone (ALDACTONE) 25 MG tablet Take 25 mg by mouth 2 (two) times daily.   . theophylline (THEODUR) 300 MG 12 hr tablet Take 1 tablet (300 mg total) by mouth 2 (two) times daily.  . traMADol (ULTRAM) 50 MG tablet Take 50 mg by mouth every 6 (six) hours as needed for moderate pain.    No facility-administered encounter medications on file as of 12/04/2020.    Surgical History: Past Surgical History:  Procedure Laterality Date  . BOWEL RESECTION  09/11/2019   Procedure: SMALL BOWEL RESECTION;  Surgeon: Herbert Pun, MD;  Location: [facility] ORS;  Service: General;;  . CARDIAC CATHETERIZATION    . cataract surgery    . COLONOSCOPY WITH PROPOFOL N/A 03/19/2020   Procedure: COLONOSCOPY WITH PROPOFOL;  Surgeon: [name], MD;  Location: [facility] ENDOSCOPY;  Service: Gastroenterology;  Laterality: N/A;  . CORONARY ANGIOPLASTY    . INCISION AND DRAINAGE ABSCESS Right 06/29/2016   Procedure: INCISION AND DRAINAGE ABSCESS;  Surgeon: Florene Glen, MD;  Location: [facility] ORS;  Service:  General;  Laterality: Right;  . INCISION AND DRAINAGE OF WOUND Left 06/29/2016   Procedure: IRRIGATION AND DEBRIDEMENT WOUND;  Surgeon: Florene Glen, MD;  Location: [facility] ORS;  Service: General;  Laterality: Left;  . IR ANGIO VERTEBRAL SEL SUBCLAVIAN INNOMINATE UNI R MOD SED  11/20/2020  . IR CT HEAD LTD  11/20/2020  . IR PERCUTANEOUS ART THROMBECTOMY/INFUSION INTRACRANIAL INC DIAG ANGIO  11/20/2020  . LAPAROSCOPIC RIGHT COLECTOMY  09/11/2019   Procedure: RIGHT COLECTOMY;  Surgeon: Herbert Pun, MD;  Location: [facility] ORS;  Service: General;;  . LAPAROSCOPY N/A 09/11/2019   Procedure: LAPAROSCOPY DIAGNOSTIC;  Surgeon: Herbert Pun, MD;  Location: [facility] ORS;  Service: General;  Laterality: N/A;  . LAPAROTOMY N/A 09/13/2019   Procedure: REOPENING OF RECENT LAPAROTOMYANASTOMOSIS OF BOWEL;  Surgeon: Herbert Pun, MD;  Location: [facility] ORS;  Service: General;  Laterality: N/A;  . RADIOLOGY WITH ANESTHESIA N/A 11/20/2020   Procedure: IR WITH ANESTHESIA - CODE STROKE;  Surgeon: Radiologist, Medication, MD;  Location:  [place] OR;  Service: Radiology;  Laterality: N/A;  . RIGHT/LEFT HEART CATH AND CORONARY ANGIOGRAPHY Bilateral 02/27/2020   Procedure: RIGHT/LEFT HEART CATH AND CORONARY ANGIOGRAPHY;  Surgeon: Wellington Hampshire, MD;  Location: [place] [facility] LAB;  Service: Cardiovascular;  Laterality: Bilateral;  . VISCERAL ANGIOGRAPHY N/A 09/12/2019   Procedure: VISCERAL ANGIOGRAPHY;  Surgeon: Algernon Huxley, MD;  Location: [place] [facility] LAB;  Service: Cardiovascular;  Laterality: N/A;    Medical History: Past Medical History:  Diagnosis Date  . Asthma   . Chronic atrial fibrillation ([place])    a. diagnosed in 09/2016; b. failed flecainide and propafenone due to LE swelling and SOB, could not afford Multaq; c. CHADS2VASc => 5 (CHF, HTN, age x 1, nonobs CAD, female)--> Eliquis  . COPD (chronic obstructive pulmonary disease) ([place])   . GERD (gastroesophageal reflux disease)   . HFmrEF  (heart failure with mid-range ejection fraction) ([place])    a. 12/2019 Echo: EF 40-45%.  . Hyperlipidemia   . Hypertension   . NICM (nonischemic cardiomyopathy) ([place])    a. 12/2019 Echo: EF 40-45%, glob HK, mildly reduced RV fxn, sev dil LA. *HR 130 (afib) during study.  . Nonobstructive CAD (coronary artery disease)    a. Lexiscan Myoview 10/2016: no evidence of ischemia, EF 53%; b. 02/2020 Cath: LM nl, LAD 20p, 42m, LCX 20ost, OM1/2/3 nl, LPDA nl, LPL1/2 nl, LPAV nl, RCA small, nl.  . Obesity   . Obstructive sleep apnea   . Pulmonary hypertension ([place])   . Stroke ([place])   . Systolic dysfunction    a. TTE 10/2016: EF 50%, mild LVH, moderately dilated LA, moderate MR/TR, mild pulmonary hypertension    Family History: Family History  Problem Relation Age of Onset  . Dementia Mother   . Osteoporosis Mother   . Vascular Disease Mother   . COPD Father     Social History   Socioeconomic History  . Marital status: Married    Spouse name: [name]   . Number of children: 2  . Years of education: Not on file  . Highest education level: Not on file  Occupational History  . Not on file  Tobacco Use  . Smoking status: Never Smoker  . Smokeless tobacco: Never Used  Vaping Use  . Vaping Use: Never used  Substance and Sexual Activity  . Alcohol use: No  . Drug use: No  . Sexual activity: Not on file  Other Topics Concern  . Not on file  Social History Narrative   3 grandchildren: 7 great grandkids.    Social Determinants of Health   Financial Resource Strain: Not on file  Food Insecurity: Not on file  Transportation Needs: Not on file  Physical Activity: Not on file  Stress: Not on file  Social Connections: Not on file  Intimate Partner Violence: Not on file      Review of Systems  Constitutional: Negative for chills, diaphoresis and fatigue.  HENT: Negative for ear pain, postnasal drip and sinus pressure.   Eyes: Negative for photophobia, discharge, redness, itching and  visual disturbance.  Respiratory: Negative for cough, shortness of breath and wheezing.   Cardiovascular: Negative for chest pain, palpitations and leg swelling.  Gastrointestinal: Negative for abdominal pain, constipation, diarrhea, nausea and vomiting.  Genitourinary: Negative for dysuria and flank pain.  Musculoskeletal: Negative for arthralgias, back pain, gait problem and neck pain.  Skin: Negative for color change.  Allergic/Immunologic: Negative for environmental allergies and food allergies.  Neurological: Positive for weakness. Negative  for dizziness and headaches.       Left sided   Hematological: Does not bruise/bleed easily.  Psychiatric/Behavioral: Negative for agitation, behavioral problems (depression) and hallucinations.    Vital Signs: BP 128/78   Pulse 70   Temp 98.2 F (36.8 C)   Resp 16   Ht 5\' 4"  (1.626 m)   Wt 243 lb (110.2 kg) Comment: per daughter on sunday.  pt in wheelchair  LMP  (LMP Unknown)   SpO2 96%   BMI 41.71 kg/m    Physical Exam Constitutional:      Appearance: She is obese.  HENT:     Mouth/Throat:     Mouth: Mucous membranes are moist.  Eyes:     Extraocular Movements: Extraocular movements intact.     Pupils: Pupils are equal, round, and reactive to light.  Cardiovascular:     Rate and Rhythm: Normal rate. Rhythm irregular.  Pulmonary:     Effort: Pulmonary effort is normal.     Breath sounds: Normal breath sounds.  Neurological:     Mental Status: She is alert.     Motor: Weakness present.     Coordination: Coordination abnormal.     Gait: Gait abnormal.       Assessment/Plan: 1. Nausea Pt to hold her theophylline for now, check therapeutic levels fo theophylline and lanoxin, both medications can cause Nausea, continue Zofran plus promethazine as before   2. Acute R MCA ischemic stroke [facility]) s/p revascularization R M1, embolic d/t known AF Followed by neurology, recovering very well   3. Chronic atrial fibrillation  ([place]) Followed by neurology   4. Chronic anticoagulation Continue Eliquis as before   General Counseling: Vee verbalizes understanding of the findings of todays visit and agrees with plan of treatment. I have discussed any further diagnostic evaluation that may be needed or ordered today. We also reviewed her medications today. she has been encouraged to call the office with any questions or concerns that should arise related to todays visit.  I have reviewed all medical records from hospital follow up including radiology reports and consults from other physicians. Appropriate follow up diagnostics will be scheduled as needed. Patient/ Family understands the plan of treatment. Time spent 31minutes.   Dr [name], MD Internal Medicine

## 2020-12-04 NOTE — Telephone Encounter (Signed)
Called and LMOM to let pt know that we sent gabapentin to her pharmacy.

## 2020-12-04 NOTE — Telephone Encounter (Signed)
Please see my message

## 2020-12-05 ENCOUNTER — Encounter: Payer: Self-pay | Admitting: Hospice and Palliative Medicine

## 2020-12-05 ENCOUNTER — Telehealth: Payer: Self-pay

## 2020-12-05 ENCOUNTER — Telehealth: Payer: Self-pay | Admitting: Cardiovascular Disease

## 2020-12-05 DIAGNOSIS — I69312 Visuospatial deficit and spatial neglect following cerebral infarction: Secondary | ICD-10-CM | POA: Diagnosis not present

## 2020-12-05 DIAGNOSIS — I69328 Other speech and language deficits following cerebral infarction: Secondary | ICD-10-CM | POA: Diagnosis not present

## 2020-12-05 DIAGNOSIS — R1312 Dysphagia, oropharyngeal phase: Secondary | ICD-10-CM | POA: Diagnosis not present

## 2020-12-05 DIAGNOSIS — I69391 Dysphagia following cerebral infarction: Secondary | ICD-10-CM | POA: Diagnosis not present

## 2020-12-05 DIAGNOSIS — I69392 Facial weakness following cerebral infarction: Secondary | ICD-10-CM | POA: Diagnosis not present

## 2020-12-05 DIAGNOSIS — I69354 Hemiplegia and hemiparesis following cerebral infarction affecting left non-dominant side: Secondary | ICD-10-CM | POA: Diagnosis not present

## 2020-12-05 LAB — BASIC METABOLIC PANEL
BUN/Creatinine Ratio: 15 (ref 12–28)
BUN: 13 mg/dL (ref 8–27)
CO2: 20 mmol/L (ref 20–29)
Calcium: 10.6 mg/dL — ABNORMAL HIGH (ref 8.7–10.3)
Chloride: 98 mmol/L (ref 96–106)
Creatinine, Ser: 0.85 mg/dL (ref 0.57–1.00)
GFR calc Af Amer: 80 mL/min/{1.73_m2} (ref >59)
GFR calc non Af Amer: 70 mL/min/{1.73_m2} (ref >59)
Glucose: 120 mg/dL — ABNORMAL HIGH (ref 65–99)
Potassium: 3.9 mmol/L (ref 3.5–5.2)
Sodium: 137 mmol/L (ref 134–144)

## 2020-12-05 LAB — DIGOXIN LEVEL: Digoxin, Serum: 0.6 ng/mL (ref 0.5–0.9)

## 2020-12-05 LAB — THEOPHYLLINE LEVEL: Theophylline, Serum: 17.4 ug/mL (ref 10.0–20.0)

## 2020-12-05 NOTE — Telephone Encounter (Signed)
Pt notified that we send gabapentin

## 2020-12-05 NOTE — Telephone Encounter (Signed)
Professional communication for OT to eval as soon as possible signed by provider and faxed back to [facility] at [phone].Also placed in [place] folder.

## 2020-12-05 NOTE — Telephone Encounter (Signed)
Recommend scheduling a follow-up appointment with an APP to go over her medications given that she had 2 recent hospitalizations and we have not seen her since then.

## 2020-12-05 NOTE — Telephone Encounter (Signed)
Spoke with patient and reviewed provider recommendations to come in for appointment. Scheduled her to see Urban Gibson on Friday at 08:30 AM to review her medications and any potential concerns. She was agreeable with this plan and had no further questions at this time. Instructed her to please arrive early for her appointment and to also bring in her medications as well so we can review them with her. She was appreciative for the call back with no further needs or concerns at this time.

## 2020-12-05 NOTE — Telephone Encounter (Signed)
Please call to discuss patient's medications. States she is fearful of her medications interacting since she had her stroke.

## 2020-12-05 NOTE — Telephone Encounter (Signed)
Spoke with patient and she was difficult to understand verbally. She was concerned about any interactions due to numerous medications. Recommended that she check with her pharmacy and also her primary care doctor. She states she did see her PCP yesterday and they are reviewing her medications. They did advise that she reduce her theophylline dose in 1/2. Advised that I would forward to Dr. Fletcher Anon for his review and would call back if any further recommendations. She verbalized understanding with no further questions at this time.

## 2020-12-05 NOTE — Telephone Encounter (Signed)
Please look into this

## 2020-12-05 NOTE — Telephone Encounter (Signed)
As per dr Humphrey Rolls advised that she can take theophylline tab take 1 tab po daily instead of 2 a day and also calcium is little high we going to recheck in 10 days and digoxin level is good

## 2020-12-06 ENCOUNTER — Telehealth (INDEPENDENT_AMBULATORY_CARE_PROVIDER_SITE_OTHER): Payer: Self-pay

## 2020-12-06 ENCOUNTER — Telehealth: Payer: Self-pay

## 2020-12-06 DIAGNOSIS — I69354 Hemiplegia and hemiparesis following cerebral infarction affecting left non-dominant side: Secondary | ICD-10-CM | POA: Diagnosis not present

## 2020-12-06 DIAGNOSIS — R1312 Dysphagia, oropharyngeal phase: Secondary | ICD-10-CM | POA: Diagnosis not present

## 2020-12-06 DIAGNOSIS — I69392 Facial weakness following cerebral infarction: Secondary | ICD-10-CM | POA: Diagnosis not present

## 2020-12-06 DIAGNOSIS — I69391 Dysphagia following cerebral infarction: Secondary | ICD-10-CM | POA: Diagnosis not present

## 2020-12-06 DIAGNOSIS — I69328 Other speech and language deficits following cerebral infarction: Secondary | ICD-10-CM | POA: Diagnosis not present

## 2020-12-06 DIAGNOSIS — I69312 Visuospatial deficit and spatial neglect following cerebral infarction: Secondary | ICD-10-CM | POA: Diagnosis not present

## 2020-12-06 NOTE — Telephone Encounter (Signed)
Gave verbal [facility] [phone] for once a week for 1 week and 2 times a week for 4 weeks

## 2020-12-06 NOTE — Telephone Encounter (Addendum)
Patient left a voicemail stating that she had stroke 11/20/20 and has very little mobility on the left side. The patient ask if she should come in for her follow up or could she reschedule her appointment to later date. I spoke with Dr Delana Meyer and he is fine with the patient pushing out her appointment to next year.

## 2020-12-06 NOTE — Telephone Encounter (Signed)
Hello,  I'm seeing Miss Thalman in follow up tomorrow. Any recommendations regarding Digoxin and Theophylline? Planning to check a level in clinic. Any input much appreciated.   Best, Loel Dubonnet, NP

## 2020-12-06 NOTE — Telephone Encounter (Signed)
There is no interaction with digoxin and theophylline. PCP must have lowered theophylline dose for other reason.  I ran a drug interaction report for her medications listed. There isnt any of major concern with the cardiac meds. Mostly her alprazolam, tramadol, promethazine and gabapentin that can cause CNS depression. Diltiazem could increase concentration of alprazolam. But no need to change therapy, just monitor.

## 2020-12-07 ENCOUNTER — Other Ambulatory Visit: Payer: Self-pay | Admitting: *Deleted

## 2020-12-07 ENCOUNTER — Ambulatory Visit: Payer: Medicare Other | Admitting: Family

## 2020-12-07 ENCOUNTER — Telehealth: Payer: Self-pay

## 2020-12-07 DIAGNOSIS — I69312 Visuospatial deficit and spatial neglect following cerebral infarction: Secondary | ICD-10-CM | POA: Diagnosis not present

## 2020-12-07 DIAGNOSIS — R1312 Dysphagia, oropharyngeal phase: Secondary | ICD-10-CM | POA: Diagnosis not present

## 2020-12-07 DIAGNOSIS — I69392 Facial weakness following cerebral infarction: Secondary | ICD-10-CM | POA: Diagnosis not present

## 2020-12-07 DIAGNOSIS — I69354 Hemiplegia and hemiparesis following cerebral infarction affecting left non-dominant side: Secondary | ICD-10-CM | POA: Diagnosis not present

## 2020-12-07 DIAGNOSIS — I69391 Dysphagia following cerebral infarction: Secondary | ICD-10-CM | POA: Diagnosis not present

## 2020-12-07 DIAGNOSIS — I69328 Other speech and language deficits following cerebral infarction: Secondary | ICD-10-CM | POA: Diagnosis not present

## 2020-12-07 NOTE — Telephone Encounter (Addendum)
Spoke with patient and offered appointment for next week to see provider here in office. Also discussed review and recommendations by provider. She sounded extremely week and states that her husband has to pick her up for restroom trips. He also has to lift her from car to wheelchair as well. She does have home PT services coming in to work with her. Advised that I would check with provider to see if we could do a virtual visit since she is having some difficulty post stroke. Let her know that I would call her back once I hear back from provider. She verbalized understanding with no further questions at this time.

## 2020-12-07 NOTE — Progress Notes (Deleted)
Office Visit    Patient Name: Laura [name] Date of Encounter: 12/07/2020  Primary Care Provider:  [name], MD Primary Cardiologist:  Kathlyn Sacramento, MD Electrophysiologist:  None   Chief Complaint    Laura [name] is a 69 y.o. female with a hx of  CVA, chronic atrial fibrillation, obesity, OSA on CPAP, asthma, essential hypertension, HF with midrange EF, NICM, HLD, obesity, COPD, GERD, nonobstructive CAD presents today for ***  Past Medical History    Past Medical History:  Diagnosis Date  . Asthma   . Chronic atrial fibrillation ([place])    a. diagnosed in 09/2016; b. failed flecainide and propafenone due to LE swelling and SOB, could not afford Multaq; c. CHADS2VASc => 5 (CHF, HTN, age x 1, nonobs CAD, female)--> Eliquis  . COPD (chronic obstructive pulmonary disease) ([place])   . GERD (gastroesophageal reflux disease)   . HFmrEF (heart failure with mid-range ejection fraction) ([place])    a. 12/2019 Echo: EF 40-45%.  . Hyperlipidemia   . Hypertension   . NICM (nonischemic cardiomyopathy) ([place])    a. 12/2019 Echo: EF 40-45%, glob HK, mildly reduced RV fxn, sev dil LA. *HR 130 (afib) during study.  . Nonobstructive CAD (coronary artery disease)    a. Lexiscan Myoview 10/2016: no evidence of ischemia, EF 53%; b. 02/2020 Cath: LM nl, LAD 20p, 44m, LCX 20ost, OM1/2/3 nl, LPDA nl, LPL1/2 nl, LPAV nl, RCA small, nl.  . Obesity   . Obstructive sleep apnea   . Pulmonary hypertension ([place])   . Stroke ([place])   . Systolic dysfunction    a. TTE 10/2016: EF 50%, mild LVH, moderately dilated LA, moderate MR/TR, mild pulmonary hypertension   Past Surgical History:  Procedure Laterality Date  . BOWEL RESECTION  09/11/2019   Procedure: SMALL BOWEL RESECTION;  Surgeon: Herbert Pun, MD;  Location: [facility] ORS;  Service: General;;  . CARDIAC CATHETERIZATION    . cataract surgery    . COLONOSCOPY WITH PROPOFOL N/A 03/19/2020   Procedure: COLONOSCOPY WITH PROPOFOL;  Surgeon: [name], MD;  Location: [facility] ENDOSCOPY;  Service: Gastroenterology;  Laterality: N/A;  . CORONARY ANGIOPLASTY    . INCISION AND DRAINAGE ABSCESS Right 06/29/2016   Procedure: INCISION AND DRAINAGE ABSCESS;  Surgeon: Florene Glen, MD;  Location: [facility] ORS;  Service: General;  Laterality: Right;  . INCISION AND DRAINAGE OF WOUND Left 06/29/2016   Procedure: IRRIGATION AND DEBRIDEMENT WOUND;  Surgeon: Florene Glen, MD;  Location: [facility] ORS;  Service: General;  Laterality: Left;  . IR ANGIO VERTEBRAL SEL SUBCLAVIAN INNOMINATE UNI R MOD SED  11/20/2020  . IR CT HEAD LTD  11/20/2020  . IR PERCUTANEOUS ART THROMBECTOMY/INFUSION INTRACRANIAL INC DIAG ANGIO  11/20/2020  . LAPAROSCOPIC RIGHT COLECTOMY  09/11/2019   Procedure: RIGHT COLECTOMY;  Surgeon: Herbert Pun, MD;  Location: [facility] ORS;  Service: General;;  . LAPAROSCOPY N/A 09/11/2019   Procedure: LAPAROSCOPY DIAGNOSTIC;  Surgeon: Herbert Pun, MD;  Location: [facility] ORS;  Service: General;  Laterality: N/A;  . LAPAROTOMY N/A 09/13/2019   Procedure: REOPENING OF RECENT LAPAROTOMYANASTOMOSIS OF BOWEL;  Surgeon: Herbert Pun, MD;  Location: [facility] ORS;  Service: General;  Laterality: N/A;  . RADIOLOGY WITH ANESTHESIA N/A 11/20/2020   Procedure: IR WITH ANESTHESIA - CODE STROKE;  Surgeon: Radiologist, Medication, MD;  Location: [place];  Service: Radiology;  Laterality: N/A;  . RIGHT/LEFT HEART CATH AND CORONARY ANGIOGRAPHY Bilateral 02/27/2020   Procedure: RIGHT/LEFT HEART CATH AND CORONARY ANGIOGRAPHY;  Surgeon: Kathlyn Sacramento  A, MD;  Location: [place] CV LAB;  Service: Cardiovascular;  Laterality: Bilateral;  . VISCERAL ANGIOGRAPHY N/A 09/12/2019   Procedure: VISCERAL ANGIOGRAPHY;  Surgeon: Algernon Huxley, MD;  Location: [place] CV LAB;  Service: Cardiovascular;  Laterality: N/A;    Allergies  Allergies  Allergen Reactions  . Flecainide Shortness Of Breath  . Metoprolol Shortness Of Breath, Swelling and Other (See  Comments)    "My limbs swell" also  . Propafenone Shortness Of Breath, Swelling and Other (See Comments)    "My limbs swell" also  . Rivaroxaban Swelling and Other (See Comments)    Xarelto- "My limbs swell"    History of Present Illness    Laura [name] is a 69 y.o. female with a hx of CVA, chronic atrial fibrillation, obesity, OSA on CPAP, asthma, essential hypertension, HF with midrange EF, NICM, HLD, obesity, COPD, GERD, nonobstructive CAD. She was last seen 10/29/20  Previous cardiac workup 2017 with pharmacologic nuclear stress test with no evidence of ischemia and EF 53%. Echo at that time with mildly reduced LVEF 50%, mild LVH, moderately dilated LA, moderate MR and TR with mild pulmonary hypertension.   Her atrial fibrillation is rate controlled as she was intolerant of multiple arrhythmics - previously failing flecainide and propafenone.  She has been previously intolerant of beta-blocker due to her lung disease. Also with trouble with Metoprolol and Rivaroxaban and as a result has been rate controlled on diltiazem and digoxin with Eliquis for anticoagulation.    Echo January 2021 with LVEF 40-45% and dilataed IVC. Subsequent CT chest dilated pulmonary artery suggestive of pulmonary hypertension. Underwent R/LHC 02/27/20 with mild nonobstructive disease, pulmonary hypertension related to left sided heart failure, wedge of 24 and PA of 43/24 (30). In follow up 03/20/20 Lasix was continued as renal function and electrolytes were stable. Lipid panel LDL 103 with Crstor 20mg  daily added.    Seen in clinic 05/10/20 doing well. Repeat lab work 05/16/20 with LDL 41. 06/01/20 she was transitioned to Warfarin due to cost, but her children then offered to pay for Eliquis and it was continued.  Seen in clinic 09/27/20 with 3 day history of nausea and lightheadedness. Her digoxin level was unremarkable. She was noted to have GI upset and encouraged to follow up with her PCP. Reported chest pain when  laying down after eating and encouraged to continue her Dexilant. Seen in follow up 10/29/20 with improvement in symptoms.   Since last seen she was admitted 11/20/20 with stroke (in setting of holding Eliquis for spinal injection). Due to right M1 emergent occlusion she underwent endovascular thrombectomy. Echo 11/21/20 with LVEF 45-50% (previously 40-45%), indeterminite diastolic parameters, RVSF mildly reduced, RV mildly enlarged, RA pressure 1mmHg. She was discharged from hospital 11/23/20 with home health. ED visit [facility]30/20 at [facility] though left after not being seen by provider for 10 hours and went to [place] 12/01/20   ***  EKGs/Labs/Other Studies Reviewed:   The following studies were reviewed today: Echo 11/21/20  1. Left ventricular ejection fraction, by estimation, is 45 to 50%. The  left ventricle has mildly decreased function. Left ventricular endocardial  border not optimally defined to evaluate regional wall motion. Left  ventricular diastolic parameters are  indeterminate.   2. Right ventricular systolic function is mildly reduced. The right  ventricular size is mildly enlarged.   3. The mitral valve is grossly normal. No evidence of mitral valve  regurgitation.   4. The aortic valve was not well visualized.  Aortic valve regurgitation  is not visualized.   5. The inferior vena cava is normal in size with <50% respiratory  variability, suggesting right atrial pressure of 8 mmHg.   Cath 02/27/20  Ost Cx lesion is 20% stenosed.  Prox LAD lesion is 20% stenosed.  Prox LAD to Mid LAD lesion is 30% stenosed.   1.  Mild nonobstructive coronary artery disease.  Left dominant coronary arteries. 2.  Left ventricular angiography was not performed.  EF was mildly reduced by echo. 3.  Right heart catheterization showed moderately elevated filling pressures with an RA of 15 mmHg, pulmonary capillary wedge pressure of 24 mmHg, PA pressure of 43/24 with a mean of 30 mmHg.  Normal cardiac  output at 5.18 with a cardiac index of 2.34.  Pulmonary vascular resistance was only 1.16 [place] units.   Recommendations: No significant coronary artery disease to require revascularization. Pulmonary hypertension seems to be all related to left sided heart failure.  The patient is volume overloaded.  She used to be on 40 mg of furosemide but that was discontinued when she was having diarrhea.  I am going to resume furosemide at the lower dose of 20 mg daily.  Echo 01/19/20  1. Left ventricular ejection fraction, by visual estimation, is 40 to  45%. The left ventricle has mild to moderately decreased function. There  is no left ventricular hypertrophy.   2. Global right ventricle has mildly reduced systolic function.The right  ventricular size is mildly enlarged. No increase in right ventricular wall  thickness.   3. Left ventricular diastolic parameters are indeterminate.   4. The left ventricle demonstrates global hypokinesis.   5. Left atrial size was severely dilated.   6. The inferior vena cava is dilated in size with <50% respiratory  variability, suggesting right atrial pressure of 15 mmHg.   7. Rhythm is atrial fibrillation, rate 130 bpm.   EKG: No EKG ordered today. EKG independently reviewed from 09/30/2020 demonstrated rate controlled atrial fibrillation 67 bpm with sloping and lateral T waves consistent with digoxin use. ***  Recent Labs: 01/02/2020: Magnesium 2.1; TSH 1.810 12/01/2020: ALT 14; BUN 24; Creatinine, Ser 1.79; Hemoglobin 15.7; Platelets 379; Potassium 4.3; Sodium 135  Recent Lipid Panel    Component Value Date/Time   CHOL 151 11/21/2020 0616   CHOL 196 03/20/2020 0830   TRIG 125 11/21/2020 0616   HDL 46 11/21/2020 0616   HDL 68 03/20/2020 0830   CHOLHDL 3.3 11/21/2020 0616   VLDL 25 11/21/2020 0616   LDLCALC 80 11/21/2020 0616   LDLCALC 103 (H) 03/20/2020 0830    Home Medications   No outpatient medications have been marked as taking for the 12/07/20  encounter (Appointment) with Loel Dubonnet, NP.    Review of Systems  *** All other systems reviewed and are otherwise negative except as noted above.  Physical Exam   *** VS:  LMP  (LMP Unknown)  , BMI There is no height or weight on file to calculate BMI. GEN: Well nourished, well developed, in no acute distress. HEENT: normal. Neck: Supple, no JVD, carotid bruits, or masses. Cardiac: irregularly irregular, no murmurs, rubs, or gallops. No clubbing, cyanosis, edema.  Radials/DP/PT 2+ and equal bilaterally.  Respiratory:  Respirations regular and unlabored. No adventitious breath sounds.  GI: Soft, nontender, nondistended, BS + x 4. MS: No deformity or atrophy. Skin: Warm and dry, no rash.   Neuro:  Strength and sensation are intact. Psych: Normal affect.  Assessment & Plan   ***  1. Coronary artery disease - Mild nonobstructive disease by cath 03/15/20.  Low-sodium, heart healthy diet encouraged.  Regular cardiovascular exercise encouraged.  GDMT includes statin.  No aspirin secondary to chronic anticoagulation.  No beta-blocker secondary to intolerance.   2. Chronic atrial fibrillation/chronic anticoagulation -rate controlled today.  Continue diltiazem 360 mg daily, digoxin 0.125 mg daily.  Denies bleeding complications, continue Eliquis 5 mg twice daily.  3. CVA -   4. High risk medication use - Digoxin for atrial fibrillation. Digoxin level 09/27/20 0.9. No signs of toxicity.  5. OSA - CPAP use encouraged.  6. HLD, LDL goal <70 -04/2020 LDL 41. Continue Crestor 20mg  daily.  7. HTN - BP well controlled. Continue current antihypertensive regimen.   8. Chronic systolic heart failure - Euvolemic and well compensated on exam. LVEF 40-45% by echo 12/2019.  Right heart catheterization 02/2020 with pulmonary hypertension related to left-sided heart failure. GDMT includes loop diuretic, MRA, ARB. No beta blocker secondary to intolerance.  Of note she is on diltiazem though this will be  continued as tachycardia is thought to be contributory to her reduced EF.  Continue low-sodium, heartily diet.  Continue regular cardiovascular exercise.  Disposition: Follow up*** with Dr. Fletcher Anon or APP   Loel Dubonnet, NP 12/07/2020, 7:29 AM

## 2020-12-07 NOTE — Telephone Encounter (Signed)
Completed FL2 form and signed by provider and faxed back to [facility] at [phone] and placed in [place] folder.

## 2020-12-07 NOTE — Telephone Encounter (Signed)
Spoke with patient and she wants to do the virtual visit. Reviewed all information we need in order to do this visit. BP, HR, Weight, and Pulse ox reading. Also discussed to have list of medications to review and that someone would call her prior to appointment to get all of this information. She states that PT will come on Tuesday prior to that appointment. Called physical therapist to request that she write down the information for patient to provide at appointment. She verbalized understanding with no further questions at this time.     Patient Consent for Virtual Visit    [name], you are scheduled for a virtual visit with your provider today.  Just as we do with appointments in the office, we must obtain your consent to participate.  Your consent will be active for this visit and any virtual visit you may have with one of our providers in the next 365 days.  If you have a MyChart account, I can also send a copy of this consent to you electronically.  All virtual visits are billed to your insurance company just like a traditional visit in the office.  As this is a virtual visit, video technology does not allow for your provider to perform a traditional examination.  This may limit your provider's ability to fully assess your condition.  If your provider identifies any concerns that need to be evaluated in person or the need to arrange testing such as labs, EKG, etc, we will make arrangements to do so.  Although advances in technology are sophisticated, we cannot ensure that it will always work on either your end or our end.  If the connection with a video visit is poor, we may have to switch to a telephone visit.  With either a video or telephone visit, we are not always able to ensure that we have a secure connection.   I need to obtain your verbal consent now.   Are you willing to proceed with your visit today?        :[id number]}     CASSIOPEIA FLORENTINO has provided verbal consent on 12/07/2020 for a  virtual visit (video or telephone).   CONSENT FOR VIRTUAL VISIT FOR:  [name]  By participating in this virtual visit I agree to the following:  I hereby voluntarily request, consent and authorize [facility] and its employed or contracted physicians, physician assistants, nurse practitioners or other licensed health care professionals (the Practitioner), to provide me with telemedicine health care services (the "Services") as deemed necessary by the treating Practitioner. I acknowledge and consent to receive the Services by the Practitioner via telemedicine. I understand that the telemedicine visit will involve communicating with the Practitioner through live audiovisual communication technology and the disclosure of certain medical information by electronic transmission. I acknowledge that I have been given the opportunity to request an in-person assessment or other available alternative prior to the telemedicine visit and am voluntarily participating in the telemedicine visit.  I understand that I have the right to withhold or withdraw my consent to the use of telemedicine in the course of my care at any time, without affecting my right to future care or treatment, and that the Practitioner or I may terminate the telemedicine visit at any time. I understand that I have the right to inspect all information obtained and/or recorded in the course of the telemedicine visit and may receive copies of available information for a reasonable fee.  I understand that some  of the potential risks of receiving the Services via telemedicine include:  [place] Kitchen Delay or interruption in medical evaluation due to technological equipment failure or disruption; . Information transmitted may not be sufficient (e.g. poor resolution of images) to allow for appropriate medical decision making by the Practitioner; and/or  . In rare instances, security protocols could fail, causing a breach of personal health  information.  Furthermore, I acknowledge that it is my responsibility to provide information about my medical history, conditions and care that is complete and accurate to the best of my ability. I acknowledge that Practitioner's advice, recommendations, and/or decision may be based on factors not within their control, such as incomplete or inaccurate data provided by me or distortions of diagnostic images or specimens that may result from electronic transmissions. I understand that the practice of medicine is not an exact science and that Practitioner makes no warranties or guarantees regarding treatment outcomes. I acknowledge that a copy of this consent can be made available to me via my patient portal ([place]), or I can request a printed copy by calling the office of [place].    I understand that my insurance will be billed for this visit.   I have read or had this consent read to me. . I understand the contents of this consent, which adequately explains the benefits and risks of the Services being provided via telemedicine.  . I have been provided ample opportunity to ask questions regarding this consent and the Services and have had my questions answered to my satisfaction. . I give my informed consent for the services to be provided through the use of telemedicine in my medical care

## 2020-12-07 NOTE — Telephone Encounter (Signed)
As long as she has capability to collect vital signs at home (BP, weight, HR) I am fine with that. If PT is coming to her home they likely could collect for her. Video would be preferred over phone. Thank you!  Loel Dubonnet, NP

## 2020-12-07 NOTE — Patient Outreach (Addendum)
Laura [name] [facility]) Care Management  12/07/2020  TYA HAUGHEY 1951/01/08 [id number]   [facility] outreach for EMMI-stroke  RED ON EMMI ALERT Day #9         Date:  Monday December 03 2020 [place] Reason:  questions/problesm with meds? Yes Lost interest in things they used to enjoy? Yes Sad, hopeless, anxious, or empty? Yes     Insurance: Medicare  [facility] admissions x 1  ED visits x 1 in the last 6 months    Outreach attempt #1 [facility] Unsuccessful outreach   Outreach attempt to the home number  No answer. Laura [name] left HIPAA [facility] Portability and Accountability Act) compliant voicemail message along with [name]'s contact info.   Plan: [facility] RN [name] scheduled this patient for another call attempt within 4-7 business days  Nazar Kuan L. Lavina Hamman, RN, BSN, [place] Coordinator Office number [phone] Mobile number [phone]  Main Laura number [phone] Fax number [phone]

## 2020-12-07 NOTE — Telephone Encounter (Signed)
Miss Parfitt cancelled her appointment today due to illness. I asked our pharmacy team to review her medications. Per our pharmacy team:  There is no interaction with digoxin and theophylline, theophylline dose likely reduced for another reason. No major concern with interactions with any of her cardiac medications. She is on a number of medications (alprazolam, tramadol, promethazine, gabapentin) that can cause CNS depression, fatigue. Her Diltiazem could increase concentration of alprazolam but no need to change, simply monitor.   If you could just call her to let her know our pharmacy has reviewed. It would be beneficial for a follow up appointment at her convenience as she has been hospitalized with stroke since last seen.   Loel Dubonnet, NP

## 2020-12-10 ENCOUNTER — Other Ambulatory Visit: Payer: Self-pay

## 2020-12-10 ENCOUNTER — Ambulatory Visit (INDEPENDENT_AMBULATORY_CARE_PROVIDER_SITE_OTHER): Payer: Medicare Other | Admitting: Vascular Surgery

## 2020-12-10 DIAGNOSIS — I69391 Dysphagia following cerebral infarction: Secondary | ICD-10-CM | POA: Diagnosis not present

## 2020-12-10 DIAGNOSIS — I69328 Other speech and language deficits following cerebral infarction: Secondary | ICD-10-CM | POA: Diagnosis not present

## 2020-12-10 DIAGNOSIS — I69312 Visuospatial deficit and spatial neglect following cerebral infarction: Secondary | ICD-10-CM | POA: Diagnosis not present

## 2020-12-10 DIAGNOSIS — I69354 Hemiplegia and hemiparesis following cerebral infarction affecting left non-dominant side: Secondary | ICD-10-CM | POA: Diagnosis not present

## 2020-12-10 DIAGNOSIS — R1312 Dysphagia, oropharyngeal phase: Secondary | ICD-10-CM | POA: Diagnosis not present

## 2020-12-10 DIAGNOSIS — R059 Cough, unspecified: Secondary | ICD-10-CM

## 2020-12-10 DIAGNOSIS — I69392 Facial weakness following cerebral infarction: Secondary | ICD-10-CM | POA: Diagnosis not present

## 2020-12-10 MED ORDER — BENZONATATE 100 MG PO CAPS: 100.0000 mg | ORAL_CAPSULE | Freq: Two times a day (BID) | ORAL | 2 refills | Status: DC | PRN

## 2020-12-11 ENCOUNTER — Encounter: Payer: Self-pay | Admitting: Family

## 2020-12-11 ENCOUNTER — Telehealth (INDEPENDENT_AMBULATORY_CARE_PROVIDER_SITE_OTHER): Payer: Medicare Other | Admitting: Family

## 2020-12-11 VITALS — BP 122/72 | HR 64 | Temp 97.6°F | Ht 64.0 in | Wt 243.0 lb

## 2020-12-11 DIAGNOSIS — E785 Hyperlipidemia, unspecified: Secondary | ICD-10-CM

## 2020-12-11 DIAGNOSIS — I25118 Atherosclerotic heart disease of native coronary artery with other forms of angina pectoris: Secondary | ICD-10-CM | POA: Diagnosis not present

## 2020-12-11 DIAGNOSIS — Z7901 Long term (current) use of anticoagulants: Secondary | ICD-10-CM

## 2020-12-11 DIAGNOSIS — G4733 Obstructive sleep apnea (adult) (pediatric): Secondary | ICD-10-CM

## 2020-12-11 DIAGNOSIS — Z8673 Personal history of transient ischemic attack (TIA), and cerebral infarction without residual deficits: Secondary | ICD-10-CM | POA: Diagnosis not present

## 2020-12-11 DIAGNOSIS — I69391 Dysphagia following cerebral infarction: Secondary | ICD-10-CM | POA: Diagnosis not present

## 2020-12-11 DIAGNOSIS — Z79899 Other long term (current) drug therapy: Secondary | ICD-10-CM

## 2020-12-11 DIAGNOSIS — I482 Chronic atrial fibrillation, unspecified: Secondary | ICD-10-CM | POA: Diagnosis not present

## 2020-12-11 DIAGNOSIS — I69392 Facial weakness following cerebral infarction: Secondary | ICD-10-CM | POA: Diagnosis not present

## 2020-12-11 DIAGNOSIS — R1312 Dysphagia, oropharyngeal phase: Secondary | ICD-10-CM | POA: Diagnosis not present

## 2020-12-11 DIAGNOSIS — I69328 Other speech and language deficits following cerebral infarction: Secondary | ICD-10-CM | POA: Diagnosis not present

## 2020-12-11 DIAGNOSIS — I69354 Hemiplegia and hemiparesis following cerebral infarction affecting left non-dominant side: Secondary | ICD-10-CM | POA: Diagnosis not present

## 2020-12-11 DIAGNOSIS — I69312 Visuospatial deficit and spatial neglect following cerebral infarction: Secondary | ICD-10-CM | POA: Diagnosis not present

## 2020-12-11 MED ORDER — ROSUVASTATIN CALCIUM 20 MG PO TABS: 20.0000 mg | ORAL_TABLET | Freq: Every day | ORAL | 3 refills | Status: DC

## 2020-12-11 NOTE — Patient Instructions (Addendum)
Medication Instructions:  Your provider has recommended you make the following change in your medication:   RESUME Rosuvastatin (Crestor) 20mg  daily  A prescription has been sent to your pharmacy   *If you need a refill on your cardiac medications before your next appointment, please call your pharmacy*  Lab Work: None ordered today.  Testing/Procedures: None ordered today.  Your echocardiogram (ultrasound of the heart) performed in the hospital 11/21/20 showed stable heart pumping function.  There was no evidence of significant heart valve abnormality.  This was a stable result compared to previous and a good result!  Follow-Up: At [facility], you and your health needs are our priority.  As part of our continuing mission to provide you with exceptional heart care, we have created designated Provider Care Teams.  These Care Teams include your primary Cardiologist (physician) and Advanced Practice Providers (APPs -  Physician Assistants and Nurse Practitioners) who all work together to provide you with the care you need, when you need it.  We recommend signing up for the patient portal called "MyChart".  Sign up information is provided on this After Visit Summary.  MyChart is used to connect with patients for Virtual Visits (Telemedicine).  Patients are able to view lab/test results, encounter notes, upcoming appointments, etc.  Non-urgent messages can be sent to your provider as well.   To learn more about what you can do with MyChart, go to [email or web address].    Your next appointment:   February 24th, 2022 with Dr. Fletcher Anon as scheduled  Other Instructions  Your previous chest discomfort was likely musculoskeletal.  You can utilize a heat pack as needed if it recurs.  Typical heart related chest pain happens on exertion (such as walking or exercising) and do not hesitate to contact us if you have recurrent chest pain.  Keep up the good work with home health PT, OT, speech.  It  sounds like you are making very good progress!  In any individual who has had a stroke we like to continue optimal control of blood pressure, cholesterol, blood sugar.  Your vital signs today show that your blood pressure and heart rate are well controlled.  We have resumed your cholesterol medicine Rosuvastatin (Crestor) at your previous dose.   Please call our office to report weight gain of 3 pounds overnight or 5 pounds in 1 week.  This would be a sign that you are holding onto extra fluid.  Also call our office if you have new or worsening shortness of breath, edema.  Recommend keeping your lower extremities elevated while you are sitting down prevent swelling.

## 2020-12-11 NOTE — Progress Notes (Signed)
Virtual Visit via Video Note   This visit type was conducted due to national recommendations for restrictions regarding the COVID-19 Pandemic (e.g. social distancing) in an effort to limit this patient's exposure and mitigate transmission in our community.  Due to her co-morbid illnesses, this patient is at least at moderate risk for complications without adequate follow up.  This format is felt to be most appropriate for this patient at this time.  All issues noted in this document were discussed and addressed.  A limited physical exam was performed with this format.  Please refer to the patient's chart for her consent to telehealth for [facility].      Date:  12/11/2020   ID:  Laura [name], DOB 24-Jan-1951, MRN [id number] The patient was identified using 2 identifiers.  Patient Location: Home Provider Location: Office/Clinic  PCP:  [name], MD  Cardiologist:  Kathlyn Sacramento, MD  Electrophysiologist:  None   Evaluation Performed:  Follow-Up Visit  Chief Complaint:  Follow up after hospitalization for stroke  History of Present Illness:    Laura [name] is a 69 y.o. female with chronic atrial fibrillation, obesity, OSA on CPAP, asthma, essential hypertension, HF with midrange EF, NICM, HLD, obesity, COPD, GERD, nonobstructive CAD, CVA. She was last seen in clinic 10/29/20.   Previous cardiac workup 2017 with pharmacologic nuclear stress test with no evidence of ischemia and EF 53%. Echo at that time with mildly reduced LVEF 50%, mild LVH, moderately dilated LA, moderate MR and TR with mild pulmonary hypertension.    Her atrial fibrillation is rate controlled as she was intolerant of multiple arrhythmics - previously failing flecainide and propafenone.  She has been previously intolerant of beta-blocker due to her lung disease. Also with trouble with Metoprolol and Rivaroxaban and as a result has been rate controlled on diltiazem and digoxin with Eliquis for  anticoagulation.     Echo January 2021 with LVEF 40-45% and dilataed IVC. Subsequent CT chest dilated pulmonary artery suggestive of pulmonary hypertension. Underwent R/LHC 02/27/20 with mild nonobstructive disease, pulmonary hypertension related to left sided heart failure, wedge of 24 and PA of 43/24 (30). In follow up 03/20/20 Lasix was continued as renal function and electrolytes were stable. Lipid panel LDL 103 with Crstor 20mg  daily added.     Seen in clinic 05/10/20 doing well. Repeat lab work 05/16/20 with LDL 41. 06/01/20 she was transitioned to Warfarin due to cost, but her children then offered to pay for Eliquis and it was continued.   Seen in clinic 09/27/20 with 3 day history of nausea and lightheadedness. Her digoxin level was unremarkable. She was noted to have GI upset and encouraged to follow up with her PCP. Reported chest pain when laying down after eating and encouraged to continue her Dexilant.   At follow up 10/29/20 her GI symptoms and dizziness have both improved.  She was diagnosed with prediabetes and was watching her diet carefully.  No changes were made to her cardiac medications.  Subsequently admitted 11/20/2020 with [place] due to acute right MCA ischemic stroke with revascularization R M1.  Stroke noted to be embolic due to known atrial fibrillation of the stroke happened in the setting of being off of her anticoagulation for lumbar injection. Echo 11/21/2020 during admission with noted difficult images.  LVEF 45 to 50%, indeterminate diastolic parameters, RV SF mildly reduced, RV mildly enlarged, RA pressure 8 mmHg.   She was seen in the ED 12/01/2020 due to nausea.  She  was dehydrated given 1 L of fluid.  She was offered to stay in the ED for social work to assist with placement in rehab facility but she preferred to continue working with home health social work regarding placement.   Follow up via video visit today. She tells me she had a few days of chest discomfort after  hospital visit which she attributes to pulling a muscle while repositioning in the bed. This improved with rest and has not recurred. Reports no shortness of breath at rest nor with exertion. No lightheadedness, dizziness, near syncope. Reports her strength has continued to improve while working with [facility] PT/OT/SLP. Tells me her blood pressure has been well controlled while at home. No edema, orthopnea, PND.   The patient does not have symptoms concerning for COVID-19 infection (fever, chills, cough, or new shortness of breath).    Past Medical History:  Diagnosis Date  . Asthma   . Chronic atrial fibrillation ([place])    a. diagnosed in 09/2016; b. failed flecainide and propafenone due to LE swelling and SOB, could not afford Multaq; c. CHADS2VASc => 5 (CHF, HTN, age x 1, nonobs CAD, female)--> Eliquis  . COPD (chronic obstructive pulmonary disease) ([place])   . GERD (gastroesophageal reflux disease)   . HFmrEF (heart failure with mid-range ejection fraction) ([place])    a. 12/2019 Echo: EF 40-45%.  . Hyperlipidemia   . Hypertension   . NICM (nonischemic cardiomyopathy) ([place])    a. 12/2019 Echo: EF 40-45%, glob HK, mildly reduced RV fxn, sev dil LA. *HR 130 (afib) during study.  . Nonobstructive CAD (coronary artery disease)    a. Lexiscan Myoview 10/2016: no evidence of ischemia, EF 53%; b. 02/2020 Cath: LM nl, LAD 20p, 58m, LCX 20ost, OM1/2/3 nl, LPDA nl, LPL1/2 nl, LPAV nl, RCA small, nl.  . Obesity   . Obstructive sleep apnea   . Pulmonary hypertension ([place])   . Stroke ([place])   . Systolic dysfunction    a. TTE 10/2016: EF 50%, mild LVH, moderately dilated LA, moderate MR/TR, mild pulmonary hypertension   Past Surgical History:  Procedure Laterality Date  . BOWEL RESECTION  09/11/2019   Procedure: SMALL BOWEL RESECTION;  Surgeon: Herbert Pun, MD;  Location: [facility] ORS;  Service: General;;  . CARDIAC CATHETERIZATION    . cataract surgery    . COLONOSCOPY WITH PROPOFOL N/A 03/19/2020    Procedure: COLONOSCOPY WITH PROPOFOL;  Surgeon: [name], MD;  Location: [facility] ENDOSCOPY;  Service: Gastroenterology;  Laterality: N/A;  . CORONARY ANGIOPLASTY    . INCISION AND DRAINAGE ABSCESS Right 06/29/2016   Procedure: INCISION AND DRAINAGE ABSCESS;  Surgeon: Florene Glen, MD;  Location: [facility] ORS;  Service: General;  Laterality: Right;  . INCISION AND DRAINAGE OF WOUND Left 06/29/2016   Procedure: IRRIGATION AND DEBRIDEMENT WOUND;  Surgeon: Florene Glen, MD;  Location: [facility] ORS;  Service: General;  Laterality: Left;  . IR ANGIO VERTEBRAL SEL SUBCLAVIAN INNOMINATE UNI R MOD SED  11/20/2020  . IR CT HEAD LTD  11/20/2020  . IR PERCUTANEOUS ART THROMBECTOMY/INFUSION INTRACRANIAL INC DIAG ANGIO  11/20/2020  . LAPAROSCOPIC RIGHT COLECTOMY  09/11/2019   Procedure: RIGHT COLECTOMY;  Surgeon: Herbert Pun, MD;  Location: [facility] ORS;  Service: General;;  . LAPAROSCOPY N/A 09/11/2019   Procedure: LAPAROSCOPY DIAGNOSTIC;  Surgeon: Herbert Pun, MD;  Location: [facility] ORS;  Service: General;  Laterality: N/A;  . LAPAROTOMY N/A 09/13/2019   Procedure: REOPENING OF RECENT LAPAROTOMYANASTOMOSIS OF BOWEL;  Surgeon: Herbert Pun, MD;  Location:  [facility] ORS;  Service: General;  Laterality: N/A;  . RADIOLOGY WITH ANESTHESIA N/A 11/20/2020   Procedure: IR WITH ANESTHESIA - CODE STROKE;  Surgeon: Radiologist, Medication, MD;  Location: [facility] OR;  Service: Radiology;  Laterality: N/A;  . RIGHT/LEFT HEART CATH AND CORONARY ANGIOGRAPHY Bilateral 02/27/2020   Procedure: RIGHT/LEFT HEART CATH AND CORONARY ANGIOGRAPHY;  Surgeon: Iran Ouch, MD;  Location: [facility] INVASIVE CV LAB;  Service: Cardiovascular;  Laterality: Bilateral;  . VISCERAL ANGIOGRAPHY N/A 09/12/2019   Procedure: VISCERAL ANGIOGRAPHY;  Surgeon: Annice Needy, MD;  Location: [facility] INVASIVE CV LAB;  Service: Cardiovascular;  Laterality: N/A;     No outpatient medications have been marked as taking for the 12/11/20 encounter  (Appointment) with Alver Sorrow, NP.     Allergies:   Flecainide, Metoprolol, Propafenone, and Rivaroxaban   Social History   Tobacco Use  . Smoking status: Never Smoker  . Smokeless tobacco: Never Used  Vaping Use  . Vaping Use: Never used  Substance Use Topics  . Alcohol use: No  . Drug use: No     Family Hx: The patient's family history includes COPD in her father; Dementia in her mother; Osteoporosis in her mother; Vascular Disease in her mother.  ROS:   Please see the history of present illness.     All other systems reviewed and are negative.   Prior CV studies:   The following studies were reviewed today: Echo 11/21/20  1. Left ventricular ejection fraction, by estimation, is 45 to 50%. The  left ventricle has mildly decreased function. Left ventricular endocardial  border not optimally defined to evaluate regional wall motion. Left  ventricular diastolic parameters are  indeterminate.   2. Right ventricular systolic function is mildly reduced. The right  ventricular size is mildly enlarged.   3. The mitral valve is grossly normal. No evidence of mitral valve  regurgitation.   4. The aortic valve was not well visualized. Aortic valve regurgitation  is not visualized.   5. The inferior vena cava is normal in size with <50% respiratory  variability, suggesting right atrial pressure of 8 mmHg.   Cath 02/27/20  Ost Cx lesion is 20% stenosed.  Prox LAD lesion is 20% stenosed.  Prox LAD to Mid LAD lesion is 30% stenosed.   1.  Mild nonobstructive coronary artery disease.  Left dominant coronary arteries. 2.  Left ventricular angiography was not performed.  EF was mildly reduced by echo. 3.  Right heart catheterization showed moderately elevated filling pressures with an RA of 15 mmHg, pulmonary capillary wedge pressure of 24 mmHg, PA pressure of 43/24 with a mean of 30 mmHg.  Normal cardiac output at 5.18 with a cardiac index of 2.34.  Pulmonary vascular  resistance was only 1.16 [place] units.   Recommendations: No significant coronary artery disease to require revascularization. Pulmonary hypertension seems to be all related to left sided heart failure.  The patient is volume overloaded.  She used to be on 40 mg of furosemide but that was discontinued when she was having diarrhea.  I am going to resume furosemide at the lower dose of 20 mg daily.   Echo 01/19/20  1. Left ventricular ejection fraction, by visual estimation, is 40 to  45%. The left ventricle has mild to moderately decreased function. There  is no left ventricular hypertrophy.   2. Global right ventricle has mildly reduced systolic function.The right  ventricular size is mildly enlarged. No increase in right ventricular wall  thickness.  3. Left ventricular diastolic parameters are indeterminate.   4. The left ventricle demonstrates global hypokinesis.   5. Left atrial size was severely dilated.   6. The inferior vena cava is dilated in size with <50% respiratory  variability, suggesting right atrial pressure of 15 mmHg.   7. Rhythm is atrial fibrillation, rate 130 bpm.    Labs/Other Tests and Data Reviewed:    EKG:  No ECG reviewed.  Recent Labs: 01/02/2020: Magnesium 2.1; TSH 1.810 12/01/2020: ALT 14; Hemoglobin 15.7; Platelets 379 12/04/2020: BUN 13; Creatinine, Ser 0.85; Potassium 3.9; Sodium 137   Recent Lipid Panel Lab Results  Component Value Date/Time   CHOL 151 11/21/2020 06:16 AM   CHOL 196 03/20/2020 08:30 AM   TRIG 125 11/21/2020 06:16 AM   HDL 46 11/21/2020 06:16 AM   HDL 68 03/20/2020 08:30 AM   CHOLHDL 3.3 11/21/2020 06:16 AM   LDLCALC 80 11/21/2020 06:16 AM   LDLCALC 103 (H) 03/20/2020 08:30 AM    Wt Readings from Last 3 Encounters:  12/04/20 243 lb (110.2 kg)  12/01/20 268 lb 15.4 oz (122 kg)  11/30/20 268 lb 15.4 oz (122 kg)      Objective:    Vital Signs:  LMP  (LMP Unknown)    VITAL SIGNS:  reviewed GEN:  no acute  distress RESPIRATORY:  normal respiratory effort, symmetric expansion CARDIOVASCULAR:  no peripheral edema NEURO:  alert and oriented x 3, no obvious focal deficit PSYCH:  normal affect  ASSESSMENT & PLAN:    1. CAD-mild nonobstructive disease by cath 03/16/2019. No anginal symptoms. Continue statin. No aspirin secondary due to chronic anticoagulation.   2. Chronic atrial fibrillation/chronic anticoagulation- Denies bleeding complicaitons on Eliquis 5mg  BID - CHADS2VASC score of at least 6 (HTN, HF, CVAx2, gender, age). HR well controlled on present Diltiazem dose.   3. High risk medication use- Digoxin level 09/2020 was normal. No signs of toxicity. Continue current dose and monitor periodically with lab work.   4. OSA- CPAP compliance encouraged.   5. History of CVA- Embolic in setting of being off Eliquis for spinal injection. Reports continued improvement in strength through home health PT, OT, SLP services. Continue to follow with neurology. We did discuss for that future procedures she will likely require shorter hold versus Lovenox utilization.   6. HLD, LDL goal less than 70- She has run out of her Rosuvastatin. Will refill Rosuvastatin 20mg  daily. Plan for repeat lipid panel at follow up.   7. HTN- BP well controlled. Continue current antihypertensive regimen.   8. Chronic systolic heart failure- Euvolemic and well compensated. GDMT includes Losartan, Digoixn. No indication for loop diuretic at this time. WIll not add diuretic due to recent ED visit with dehydration. Of note, she is on diltiazem which is not preferred agent in HF but as her PAF is well controlled will defer changes at this time.     COVID-19 Education: The signs and symptoms of COVID-19 were discussed with the patient and how to seek care for testing (follow up with PCP or arrange E-visit).  The importance of social distancing was discussed today.  Time:   Today, I have spent 15 minutes with the patient with  telehealth technology discussing the above problems.     Medication Adjustments/Labs and Tests Ordered: Current medicines are reviewed at length with the patient today.  Concerns regarding medicines are outlined above.   Tests Ordered: No orders of the defined types were placed in this encounter.   Medication Changes:  No orders of the defined types were placed in this encounter.   Follow Up:  In Person in February with Dr. Fletcher Anon as previously scheduled.  Signed, Loel Dubonnet, NP  12/11/2020 1:11 PM    [facility]

## 2020-12-12 ENCOUNTER — Other Ambulatory Visit: Payer: Self-pay | Admitting: *Deleted

## 2020-12-12 ENCOUNTER — Other Ambulatory Visit: Payer: Self-pay

## 2020-12-12 ENCOUNTER — Encounter: Payer: Self-pay | Admitting: *Deleted

## 2020-12-12 DIAGNOSIS — R1312 Dysphagia, oropharyngeal phase: Secondary | ICD-10-CM | POA: Diagnosis not present

## 2020-12-12 DIAGNOSIS — I69391 Dysphagia following cerebral infarction: Secondary | ICD-10-CM | POA: Diagnosis not present

## 2020-12-12 DIAGNOSIS — I69354 Hemiplegia and hemiparesis following cerebral infarction affecting left non-dominant side: Secondary | ICD-10-CM | POA: Diagnosis not present

## 2020-12-12 DIAGNOSIS — I69312 Visuospatial deficit and spatial neglect following cerebral infarction: Secondary | ICD-10-CM | POA: Diagnosis not present

## 2020-12-12 DIAGNOSIS — I69392 Facial weakness following cerebral infarction: Secondary | ICD-10-CM | POA: Diagnosis not present

## 2020-12-12 DIAGNOSIS — I69328 Other speech and language deficits following cerebral infarction: Secondary | ICD-10-CM | POA: Diagnosis not present

## 2020-12-12 NOTE — Patient Outreach (Signed)
Laura [name]) Care Management  12/12/2020  KENNEDI LIZARDO September 13, 1951 [id number]   [facility] outreach for EMMI-stroke  Mrs DORA SIMEONE was referred to [facility] on 12/04/20 for EMMI stroke  RED ON EMMI ALERT Day #9         Date:  Monday December 03 2020 1000  Red Alert Reason:  questions/problems with meds? Yes Lost interest in things they used to enjoy? Yes Sad, hopeless, anxious, or empty? Yes     Insurance: Medicare and [facility] admissions x 1  ED visits x 1 in the last 6 months  Last admission 11/20/20 to 11/23/20 for acute R [place] Artery ischemic stroke s/p revascularization RM1, embolic d/t known AF, generalized anxiety disorder, hypertension, chronic atrial fibrillation, GERD, OSA on CPAP, morbid obesity, obstructive chronic bronchitis without exacerbation, PAD, Chronic systolic heart failure,  Hyperlipidemia, nonischemic cardiomyopathy  Outreach attempt #2 Unsuccessful outreach Outreach attempt to the home number  No answer. [facility] RN CM left HIPAA [facility] Portability and Accountability Act) compliant voicemail message along with CM's contact info.   Incoming outreach from patient shortly after voice message left  Patient is able to verify HIPAA ([place] and Accountability Act) identifiers Reviewed and addressed the purpose of the follow up call with the patient  Consent: [facility] RN CM reviewed [facility] services with patient. Patient gave verbal consent for services.     EMMI  EMMI questions reviewed and pt reports all answers are incorrect except her loss of interest in things she used to enjoy.  She reports having difficulty with the EMMI outreach calls understanding her speech as it was affected by her stroke She reports completing a virtual outreach with her cardiologist and pulmonologist to reconcile her medications and to ask questions.  She denies feeling sad, hopeless,  anxious, or empty. She confirms she relies on her spiritual values and the affirmation that she accept the things she cannot change, and work on those she can change. She has xanax prn  She reports her loss of interest in things she used to enjoy is related to her not being able to eat well and a loss of taste She reports a weight loss of 25 lbs during her hospitalization Her action plan includes intake of ensure with her home health staff monitoring her weight during visits. HH OT is assisting her to improve with her  Activities of daily living (ADLs) She reports she is transferring better, able to get in her bed independently and walking better with her walker She voices she is now able to go to the restroom independently  Mrs Iovine reports she has good support from her husband, her daughters and her two great grandchildren that live with her    Medications and vaccines  Mrs Patchen received covid vaccine during her hospitalization She denies cost concerns with medications but reports she generally has medicare gap concern with Eliquis [facility] RN CM educated her on [facility] available services for patient medication assistance She reports she will notify [facility] RN CM of any future medication cost concerns   Social 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 Asthma   . Chronic atrial fibrillation ([place])    a. diagnosed in 09/2016; b. failed flecainide and propafenone due to LE swelling and SOB, could not afford Multaq; c.  CHADS2VASc => 5 (CHF, HTN, age x 1, nonobs CAD, female)--> Eliquis  . COPD (chronic obstructive pulmonary disease) ([place])   . GERD (gastroesophageal reflux disease)   . HFmrEF (heart failure with mid-range ejection fraction) ([place])    a. 12/2019 Echo: EF 40-45%.  . Hyperlipidemia   . Hypertension   . NICM (nonischemic cardiomyopathy) ([place])    a.  12/2019 Echo: EF 40-45%, glob HK, mildly reduced RV fxn, sev dil LA. *HR 130 (afib) during study.  . Nonobstructive CAD (coronary artery disease)    a. Lexiscan Myoview 10/2016: no evidence of ischemia, EF 53%; b. 02/2020 Cath: LM nl, LAD 20p, 57m, LCX 20ost, OM1/2/3 nl, LPDA nl, LPL1/2 nl, LPAV nl, RCA small, nl.  . Obesity   . Obstructive sleep apnea   . Pulmonary hypertension ([place])   . Stroke ([place])   . Systolic dysfunction    a. TTE 10/2016: EF 50%, mild LVH, moderately dilated LA, moderate MR/TR, mild pulmonary hypertension   Plan: Patient agrees to care plan and follow up [place] CM scheduled this patient for another call attempt within 30-45 business days Pt encouraged to return a call to [facility] RN CM prn Routed note to MD Goals      Patient Stated   .  [facility] ) Keep or Improve My Strength-Stroke (pt-stated)      Timeframe:  Long-Range Goal Priority:  High Start Date:                   12/12/20          Expected End Date:             03/21/20          Follow Up Date 01/14/21   - attend 90 percent of occupational therapy appointments - attend 90 percent of physical therapy appointments - increase activity or exercise time a little every week - plan activity for times when energy is the highest       Notes:         Nakeya Adinolfi L. Lavina Hamman, RN, BSN, [place] Coordinator Office number [phone] Mobile number [phone]  Main [facility] number [phone] Fax number [phone]

## 2020-12-13 DIAGNOSIS — I69354 Hemiplegia and hemiparesis following cerebral infarction affecting left non-dominant side: Secondary | ICD-10-CM | POA: Diagnosis not present

## 2020-12-13 DIAGNOSIS — I69391 Dysphagia following cerebral infarction: Secondary | ICD-10-CM | POA: Diagnosis not present

## 2020-12-13 DIAGNOSIS — I69312 Visuospatial deficit and spatial neglect following cerebral infarction: Secondary | ICD-10-CM | POA: Diagnosis not present

## 2020-12-13 DIAGNOSIS — I69392 Facial weakness following cerebral infarction: Secondary | ICD-10-CM | POA: Diagnosis not present

## 2020-12-13 DIAGNOSIS — I69328 Other speech and language deficits following cerebral infarction: Secondary | ICD-10-CM | POA: Diagnosis not present

## 2020-12-13 DIAGNOSIS — R1312 Dysphagia, oropharyngeal phase: Secondary | ICD-10-CM | POA: Diagnosis not present

## 2020-12-17 ENCOUNTER — Telehealth: Payer: Self-pay

## 2020-12-17 ENCOUNTER — Other Ambulatory Visit: Payer: Self-pay | Admitting: Hospice and Palliative Medicine

## 2020-12-17 DIAGNOSIS — I69392 Facial weakness following cerebral infarction: Secondary | ICD-10-CM | POA: Diagnosis not present

## 2020-12-17 DIAGNOSIS — I69328 Other speech and language deficits following cerebral infarction: Secondary | ICD-10-CM | POA: Diagnosis not present

## 2020-12-17 DIAGNOSIS — R059 Cough, unspecified: Secondary | ICD-10-CM

## 2020-12-17 DIAGNOSIS — I69312 Visuospatial deficit and spatial neglect following cerebral infarction: Secondary | ICD-10-CM | POA: Diagnosis not present

## 2020-12-17 DIAGNOSIS — I69354 Hemiplegia and hemiparesis following cerebral infarction affecting left non-dominant side: Secondary | ICD-10-CM | POA: Diagnosis not present

## 2020-12-17 DIAGNOSIS — R1312 Dysphagia, oropharyngeal phase: Secondary | ICD-10-CM | POA: Diagnosis not present

## 2020-12-17 DIAGNOSIS — I69391 Dysphagia following cerebral infarction: Secondary | ICD-10-CM | POA: Diagnosis not present

## 2020-12-17 NOTE — Progress Notes (Signed)
CXR ordered for ongoing cough, has completed azithromycin therapy.

## 2020-12-17 NOTE — Telephone Encounter (Signed)
LMOM for pt.  Informed pt that per Ladona Ridgel she can do the budesonide neb treatments along with taking theophyline.  Pt did inform me that she did start the theophyline back and is taking 1/2 tab in morning and 1/2 tab at night and she is doing good and not having any nausea.   Pt called and advied that she is still having a bad cough and wants to know what can be given to help.  Pt hasn't been on prednisone since she got out of hospital.  I LMOM that taylor wants pt to get a chest xray and that we have the order in for her to go to [facility] outpatient imaging to have it done.

## 2020-12-18 ENCOUNTER — Telehealth: Payer: Self-pay | Admitting: Adult Health

## 2020-12-18 NOTE — Telephone Encounter (Signed)
Patient apologized for missing your call. She states that she was referring to a TENS unit.

## 2020-12-18 NOTE — Telephone Encounter (Signed)
I called and LMVM for pt that we have not seen you in the office yet.  Has appt 01-11-21 with JM/NP.  Not sure what machine your talking about.  I will try to help, or you may speak to therapist or whomever gave you machine for your leg.

## 2020-12-18 NOTE — Telephone Encounter (Signed)
I called pt.  She had a TENS units from [facility] Dr. Marcelle Smiling [facility]) previously for swelling in her calves and feet.  She had not been using, but since her stroke, she felt like it would help with swelling and then help with balance. She has call to [facility], but wanted to ask [place] due to her having stroke.  Seen Dr.Xu 11-20-20.  What would you recommend?  She has appt with [place]   In January  To see JM/NP.  I consulted Dr. Pearlean Brownie.  Did not see problem with using tens unit,  of it negatively affecting her stroke recovery.

## 2020-12-18 NOTE — Telephone Encounter (Signed)
PT would like a call back regarding questions she has about using a machine she has for her leg therapy. She said the machine sends electric shots to her legs and wants to know if it will affect her stroke recovery.

## 2020-12-19 ENCOUNTER — Telehealth (INDEPENDENT_AMBULATORY_CARE_PROVIDER_SITE_OTHER): Payer: Self-pay

## 2020-12-19 DIAGNOSIS — I69392 Facial weakness following cerebral infarction: Secondary | ICD-10-CM | POA: Diagnosis not present

## 2020-12-19 DIAGNOSIS — I69328 Other speech and language deficits following cerebral infarction: Secondary | ICD-10-CM | POA: Diagnosis not present

## 2020-12-19 DIAGNOSIS — I69312 Visuospatial deficit and spatial neglect following cerebral infarction: Secondary | ICD-10-CM | POA: Diagnosis not present

## 2020-12-19 DIAGNOSIS — I69354 Hemiplegia and hemiparesis following cerebral infarction affecting left non-dominant side: Secondary | ICD-10-CM | POA: Diagnosis not present

## 2020-12-19 DIAGNOSIS — I69391 Dysphagia following cerebral infarction: Secondary | ICD-10-CM | POA: Diagnosis not present

## 2020-12-19 DIAGNOSIS — R1312 Dysphagia, oropharyngeal phase: Secondary | ICD-10-CM | POA: Diagnosis not present

## 2020-12-19 NOTE — Telephone Encounter (Signed)
I called the pt and made her aware of the Np's instructions. 

## 2020-12-19 NOTE — Telephone Encounter (Signed)
I spoke to pt this am.  I relayed that per Dr. Pearlean Brownie, he did not see problem with using the TENS unit affecting her stroke recovery.  She needs to also contact VV Dr. Marcelle Smiling and let hime know as well.  She said she would.

## 2020-12-19 NOTE — Telephone Encounter (Signed)
Her TENS device will not help her blood flow.  A TENS device is meant to provide electrical nerve stimulation to help with pain.  As far as use of the TENS post stroke, I would defer that question to her neurologist.

## 2020-12-19 NOTE — Telephone Encounter (Signed)
Pt called and left a message on the nurses line saying that she wants to know since her insurance wouldn't cover the lymph pumps recomended by Dr. Wyn Quaker to help with the blood flow in her legs as well as the tens unit . The pt wanted to know is just using the tens unit ok since she has had a stroke . Please advise.

## 2020-12-20 ENCOUNTER — Ambulatory Visit (INDEPENDENT_AMBULATORY_CARE_PROVIDER_SITE_OTHER): Payer: Medicare Other | Admitting: Hospice and Palliative Medicine

## 2020-12-20 ENCOUNTER — Encounter: Payer: Self-pay | Admitting: Hospice and Palliative Medicine

## 2020-12-20 ENCOUNTER — Ambulatory Visit
Admission: RE | Admit: 2020-12-20 | Discharge: 2020-12-20 | Disposition: A | Discharge status: Home / Self Care | Payer: Medicare Other | Source: Ambulatory Visit | Attending: Hospice and Palliative Medicine | Admitting: Hospice and Palliative Medicine

## 2020-12-20 ENCOUNTER — Other Ambulatory Visit: Payer: Self-pay

## 2020-12-20 ENCOUNTER — Ambulatory Visit
Admission: RE | Admit: 2020-12-20 | Discharge: 2020-12-20 | Disposition: A | Discharge status: Home / Self Care | Payer: Medicare Other | Attending: Hospice and Palliative Medicine | Admitting: Hospice and Palliative Medicine

## 2020-12-20 VITALS — BP 142/60 | HR 58 | Temp 97.9°F | Resp 16 | Ht 64.0 in | Wt 243.0 lb

## 2020-12-20 DIAGNOSIS — R0602 Shortness of breath: Secondary | ICD-10-CM | POA: Diagnosis not present

## 2020-12-20 DIAGNOSIS — R0902 Hypoxemia: Secondary | ICD-10-CM | POA: Diagnosis not present

## 2020-12-20 DIAGNOSIS — R059 Cough, unspecified: Secondary | ICD-10-CM | POA: Insufficient documentation

## 2020-12-20 DIAGNOSIS — E538 Deficiency of other specified B group vitamins: Secondary | ICD-10-CM

## 2020-12-20 DIAGNOSIS — I10 Essential (primary) hypertension: Secondary | ICD-10-CM | POA: Diagnosis not present

## 2020-12-20 DIAGNOSIS — R110 Nausea: Secondary | ICD-10-CM | POA: Diagnosis not present

## 2020-12-20 MED ORDER — PREDNISONE 10 MG PO TABS: 10.0000 mg | ORAL_TABLET | Freq: Every day | ORAL | 0 refills | Status: DC

## 2020-12-20 MED ORDER — CYANOCOBALAMIN 1000 MCG/ML IJ SOLN
1000.0000 ug | Freq: Once | INTRAMUSCULAR | Status: AC
  Administered 2020-12-20: 1000 ug via INTRAMUSCULAR

## 2020-12-20 NOTE — Progress Notes (Signed)
[facility] [place], [place] [address]  Internal MEDICINE  Office Visit Note  Patient Name: Laura [name]  [id number]  [id number]  Date of Service: 12/25/2020  Chief Complaint  Patient presents with  . Follow-up    6 minute walk, b12, shortness of breath when walking, x-ray was done this morning, discuss meds, nausea since pt had stroke 11/20/20,   . COPD  . Asthma  . Hyperlipidemia  . Hypertension  . Gastroesophageal Reflux  . Quality Metric Gaps    Mammogram, dexa  . Sleep Apnea    HPI Patient is here for routine follow-up S/p CVA embolic-11/30 Since discharge home post CVA she has been working with OT, PT and SLP She was working with PT last week and noticed dropping SpO2 levels, she contacted our office and we proceeded with ordering supplemental oxygen for her in order to continue with therapy as this is an important step in the process of her recovery  She feels that when she stopped taking theophylline due to elevated levels, her nausea resolved, nausea returned when she restarted theophylline She would also like to restart daily prednisone therapy she was prescribed prior to hospitalizations, feels her breathing was improved on daily prednisone   Current Medication: Outpatient Encounter Medications as of 12/20/2020  Medication Sig  . predniSONE (DELTASONE) 10 MG tablet Take 1 tablet (10 mg total) by mouth daily with breakfast.  . Accu-Chek Softclix Lancets lancets Use as instructed to check blood sugars daily 2 hours after meal  . albuterol (VENTOLIN HFA) 108 (90 Base) MCG/ACT inhaler Inhale 2 puffs into the lungs every 4 (four) hours as needed for wheezing or shortness of breath.  . ALPRAZolam (XANAX) 0.25 MG tablet Take 0.25 mg by mouth 2 (two) times daily as needed for anxiety.  Marland Kitchen apixaban (ELIQUIS) 5 MG TABS tablet Take 1 tablet (5 mg total) by mouth 2 (two) times daily.  . benzonatate (TESSALON) 100 MG capsule Take 1 capsule (100 mg  total) by mouth 2 (two) times daily as needed for cough.  . budesonide (PULMICORT) 0.25 MG/2ML nebulizer solution Take 2 mLs (0.25 mg total) by nebulization 2 (two) times daily.  . digoxin (LANOXIN) 0.125 MG tablet Take 1 tablet (0.125 mg total) by mouth daily.  Marland Kitchen EPINEPHrine 0.3 mg/0.3 mL IJ SOAJ injection Inject 0.3 mg into the muscle as needed for anaphylaxis.   Marland Kitchen gabapentin (NEURONTIN) 100 MG capsule Take 1 capsule (100 mg total) by mouth 3 (three) times daily.  Marland Kitchen glucose blood (ACCU-CHEK GUIDE) test strip Use as instructed to check blood sugars daily 2 hours after meal  . ipratropium-albuterol (DUONEB) 0.5-2.5 (3) MG/3ML SOLN Take 3 mLs by nebulization every 4 (four) hours as needed.  . loperamide (IMODIUM) 2 MG capsule Take 4 mg by mouth as needed for diarrhea or loose stools.  Marland Kitchen losartan (COZAAR) 25 MG tablet Take 1 tablet (25 mg total) by mouth daily.  . Melatonin 10 MG TABS Take 10 mg by mouth at bedtime as needed.  . montelukast (SINGULAIR) 10 MG tablet Take 1 tablet (10 mg total) by mouth daily.  . ondansetron (ZOFRAN) 4 MG tablet Take 1 tablet (4 mg total) by mouth every 8 (eight) hours as needed for nausea or vomiting.  . Probiotic Product (PROBIOTIC-10) CAPS Take 1 capsule by mouth daily.   . promethazine (PHENERGAN) 12.5 MG tablet Take 1 tablet (12.5 mg total) by mouth every 12 (twelve) hours as needed for nausea or vomiting.  . rosuvastatin (  CRESTOR) 20 MG tablet Take 1 tablet (20 mg total) by mouth daily.  . traMADol (ULTRAM) 50 MG tablet Take 50 mg by mouth every 6 (six) hours as needed for moderate pain.   . [DISCONTINUED] diltiazem (CARDIZEM CD) 360 MG 24 hr capsule Take 1 capsule (360 mg total) by mouth daily.  . [EXPIRED] cyanocobalamin ((VITAMIN B-12)) injection 1,000 mcg    No facility-administered encounter medications on file as of 12/20/2020.    Surgical History: Past Surgical History:  Procedure Laterality Date  . BOWEL RESECTION  09/11/2019   Procedure: SMALL  BOWEL RESECTION;  Surgeon: Herbert Pun, MD;  Location: [facility] ORS;  Service: General;;  . CARDIAC CATHETERIZATION    . cataract surgery    . COLONOSCOPY WITH PROPOFOL N/A 03/19/2020   Procedure: COLONOSCOPY WITH PROPOFOL;  Surgeon: [name], MD;  Location: [facility] ENDOSCOPY;  Service: Gastroenterology;  Laterality: N/A;  . CORONARY ANGIOPLASTY    . INCISION AND DRAINAGE ABSCESS Right 06/29/2016   Procedure: INCISION AND DRAINAGE ABSCESS;  Surgeon: Florene Glen, MD;  Location: [facility] ORS;  Service: General;  Laterality: Right;  . INCISION AND DRAINAGE OF WOUND Left 06/29/2016   Procedure: IRRIGATION AND DEBRIDEMENT WOUND;  Surgeon: Florene Glen, MD;  Location: [facility] ORS;  Service: General;  Laterality: Left;  . IR ANGIO VERTEBRAL SEL SUBCLAVIAN INNOMINATE UNI R MOD SED  11/20/2020  . IR CT HEAD LTD  11/20/2020  . IR PERCUTANEOUS ART THROMBECTOMY/INFUSION INTRACRANIAL INC DIAG ANGIO  11/20/2020  . LAPAROSCOPIC RIGHT COLECTOMY  09/11/2019   Procedure: RIGHT COLECTOMY;  Surgeon: Herbert Pun, MD;  Location: [facility] ORS;  Service: General;;  . LAPAROSCOPY N/A 09/11/2019   Procedure: LAPAROSCOPY DIAGNOSTIC;  Surgeon: Herbert Pun, MD;  Location: [facility] ORS;  Service: General;  Laterality: N/A;  . LAPAROTOMY N/A 09/13/2019   Procedure: REOPENING OF RECENT LAPAROTOMYANASTOMOSIS OF BOWEL;  Surgeon: Herbert Pun, MD;  Location: [facility] ORS;  Service: General;  Laterality: N/A;  . RADIOLOGY WITH ANESTHESIA N/A 11/20/2020   Procedure: IR WITH ANESTHESIA - CODE STROKE;  Surgeon: Radiologist, Medication, MD;  Location: [facility];  Service: Radiology;  Laterality: N/A;  . RIGHT/LEFT HEART CATH AND CORONARY ANGIOGRAPHY Bilateral 02/27/2020   Procedure: RIGHT/LEFT HEART CATH AND CORONARY ANGIOGRAPHY;  Surgeon: Wellington Hampshire, MD;  Location: [facility] CV LAB;  Service: Cardiovascular;  Laterality: Bilateral;  . VISCERAL ANGIOGRAPHY N/A 09/12/2019   Procedure: VISCERAL ANGIOGRAPHY;   Surgeon: Algernon Huxley, MD;  Location: [facility];  Service: Cardiovascular;  Laterality: N/A;    Medical History: Past Medical History:  Diagnosis Date  . Asthma   . Chronic atrial fibrillation ([place])    a. diagnosed in 09/2016; b. failed flecainide and propafenone due to LE swelling and SOB, could not afford Multaq; c. CHADS2VASc => 5 (CHF, HTN, age x 1, nonobs CAD, female)--> Eliquis  . COPD (chronic obstructive pulmonary disease) ([place])   . GERD (gastroesophageal reflux disease)   . HFmrEF (heart failure with mid-range ejection fraction) ([place])    a. 12/2019 Echo: EF 40-45%.  . Hyperlipidemia   . Hypertension   . NICM (nonischemic cardiomyopathy) ([place])    a. 12/2019 Echo: EF 40-45%, glob HK, mildly reduced RV fxn, sev dil LA. *HR 130 (afib) during study.  . Nonobstructive CAD (coronary artery disease)    a. Lexiscan Myoview 10/2016: no evidence of ischemia, EF 53%; b. 02/2020 Cath: LM nl, LAD 20p, 51m, LCX 20ost, OM1/2/3 nl, LPDA nl, LPL1/2 nl, LPAV nl, RCA small, nl.  Marland Kitchen  Obesity   . Obstructive sleep apnea   . Pulmonary hypertension (H[facility])   . Sleep apnea   . Stroke (H[facility])   . Systolic dysfunction    a. TTE 10/2016: EF 50%, mild LVH, moderately dilated LA, moderate MR/TR, mild pulmonary hypertension    Family History: Family History  Problem Relation Age of Onset  . Dementia Mother   . Osteoporosis Mother   . Vascular Disease Mother   . COPD Father   . Heart disease Brother     Social History   Socioeconomic History  . Marital status: Married    Spouse name: [name]   . Number of children: 2  . Years of education: Not on file  . Highest education level: Not on file  Occupational History  . 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 Smoking status: Never Smoker  . Smokeless tobacco: Never Used  Vaping Use  . Vaping Use: Never used  Substance and Sexual Activity  . Alcohol use: No  .  Drug use: No  . Sexual activity: Not on file  Other Topics Concern  . Not on file  Social History Narrative   3 grandchildren: 7 great grandkids.    Social Determinants of Health   Financial Resource Strain: Low Risk   . Difficulty of Paying Living Expenses: Not hard at all  Food Insecurity: No Food Insecurity  . Worried About Programme researcher, broadcasting/film/video in the Last Year: Never true  . Ran Out of Food in the Last Year: Never true  Transportation Needs: No Transportation Needs  . Lack of Transportation (Medical): No  . Lack of Transportation (Non-Medical): No  Physical Activity: Not on file  Stress: No Stress Concern Present  . Feeling of Stress : Only a little  Social Connections: Socially Integrated  . Frequency of Communication with Friends and Family: More than three times a week  . Frequency of Social Gatherings with Friends and Family: More than three times a week  . Attends Religious Services: More than 4 times per year  . Active Member of Clubs or Organizations: Yes  . Attends Banker Meetings: More than 4 times per year  . Marital Status: Married  Catering manager Violence: Not At Risk  . Fear of Current or Ex-Partner: No  . Emotionally Abused: No  . Physically Abused: No  . Sexually Abused: No   Review of Systems  Constitutional: Negative for chills, diaphoresis and fatigue.  HENT: Negative for ear pain, postnasal drip and sinus pressure.   Eyes: Negative for photophobia, discharge, redness, itching and visual disturbance.  Respiratory: Positive for shortness of breath. Negative for cough and wheezing.   Cardiovascular: Negative for chest pain, palpitations and leg swelling.  Gastrointestinal: Positive for nausea. Negative for abdominal pain, constipation, diarrhea and vomiting.  Genitourinary: Negative for dysuria and flank pain.  Musculoskeletal: Negative for arthralgias, back pain, gait problem and neck pain.  Skin: Negative for color change.   Allergic/Immunologic: Negative for environmental allergies and food allergies.  Neurological: Positive for weakness. Negative for dizziness and headaches.  Hematological: Does not bruise/bleed easily.  Psychiatric/Behavioral: Negative for agitation, behavioral problems (depression) and hallucinations.     Vital Signs: BP (!) 142/60   Pulse (!) 58   Temp 97.9 F (36.6 C)   Resp 16   Ht 5\' 4"  (1.626 m)   Wt 243 lb (110.2 kg)  LMP  (LMP Unknown)   SpO2 98%   BMI 41.71 kg/m    Physical Exam Vitals reviewed.  Constitutional:      Appearance: Normal appearance. She is obese.  Cardiovascular:     Rate and Rhythm: Normal rate and regular rhythm.     Pulses: Normal pulses.     Heart sounds: Normal heart sounds.  Pulmonary:     Effort: Tachypnea present.     Breath sounds: Examination of the right-lower field reveals decreased breath sounds. Examination of the left-lower field reveals decreased breath sounds. Decreased breath sounds present.     Comments: Prolonged recovery time after attempting 6 minute walk Musculoskeletal:     Comments: Using walker for ambulation assitance  Skin:    General: Skin is warm.  Neurological:     General: No focal deficit present.     Mental Status: She is alert and oriented to person, place, and time. Mental status is at baseline.  Psychiatric:        Mood and Affect: Mood normal.        Behavior: Behavior normal.        Thought Content: Thought content normal.        Judgment: Judgment normal.    Assessment/Plan: 1. Hypoxia 6 minute walk--unable to complete due to SpO2 levels dropping to 84% with minimal exertion attempting to perform testing Baseline SpO2 95%, recovery SpO2 92% with significant dyspnea Oxygen ordered for continuous use through DME company  2. Nausea Discontinue Theophylline at this time, will discuss alternative options with Dr. Devona Konig  3. SOB (shortness of breath) Short term restart of prednisone therapy,  advised to attempt to only take every other day due to increased risk factors, will need follow-up with pulmonology to optimize therapy - 6 minute walk; Future - predniSONE (DELTASONE) 10 MG tablet; Take 1 tablet (10 mg total) by mouth daily with breakfast.  Dispense: 30 tablet; Refill: 0  4. Essential hypertension BP slightly elevated today, improved after recovery from ambulation--will need to closely monitor and tight control  5. B12 deficiency - cyanocobalamin ((VITAMIN B-12)) injection 1,000 mcg  General Counseling: Indira verbalizes understanding of the findings of todays visit and agrees with plan of treatment. I have discussed any further diagnostic evaluation that may be needed or ordered today. We also reviewed her medications today. she has been encouraged to call the office with any questions or concerns that should arise related to todays visit.    Orders Placed This Encounter  Procedures  . 6 minute walk    Meds ordered this encounter  Medications  . cyanocobalamin ((VITAMIN B-12)) injection 1,000 mcg  . predniSONE (DELTASONE) 10 MG tablet    Sig: Take 1 tablet (10 mg total) by mouth daily with breakfast.    Dispense:  30 tablet    Refill:  0    Time spent: 30 Minutes Time spent includes review of chart, medications, test results and follow-up plan with the patient.  This patient was seen by Theodoro Grist AGNP-C in Collaboration with Dr [name] as a part of collaborative care agreement     Tanna Furry. Kiandra Sanguinetti AGNP-C Internal medicine

## 2020-12-24 ENCOUNTER — Telehealth: Payer: Self-pay

## 2020-12-24 ENCOUNTER — Other Ambulatory Visit: Payer: Self-pay | Admitting: *Deleted

## 2020-12-24 ENCOUNTER — Other Ambulatory Visit: Payer: Self-pay | Admitting: Gastroenterology

## 2020-12-24 DIAGNOSIS — I739 Peripheral vascular disease, unspecified: Secondary | ICD-10-CM | POA: Diagnosis not present

## 2020-12-24 DIAGNOSIS — I69392 Facial weakness following cerebral infarction: Secondary | ICD-10-CM | POA: Diagnosis not present

## 2020-12-24 DIAGNOSIS — R1312 Dysphagia, oropharyngeal phase: Secondary | ICD-10-CM | POA: Diagnosis not present

## 2020-12-24 DIAGNOSIS — K219 Gastro-esophageal reflux disease without esophagitis: Secondary | ICD-10-CM | POA: Diagnosis not present

## 2020-12-24 DIAGNOSIS — I69391 Dysphagia following cerebral infarction: Secondary | ICD-10-CM | POA: Diagnosis not present

## 2020-12-24 DIAGNOSIS — I482 Chronic atrial fibrillation, unspecified: Secondary | ICD-10-CM | POA: Diagnosis not present

## 2020-12-24 DIAGNOSIS — I69354 Hemiplegia and hemiparesis following cerebral infarction affecting left non-dominant side: Secondary | ICD-10-CM | POA: Diagnosis not present

## 2020-12-24 DIAGNOSIS — J449 Chronic obstructive pulmonary disease, unspecified: Secondary | ICD-10-CM | POA: Diagnosis not present

## 2020-12-24 DIAGNOSIS — I69312 Visuospatial deficit and spatial neglect following cerebral infarction: Secondary | ICD-10-CM | POA: Diagnosis not present

## 2020-12-24 DIAGNOSIS — Z9981 Dependence on supplemental oxygen: Secondary | ICD-10-CM | POA: Diagnosis not present

## 2020-12-24 DIAGNOSIS — I69328 Other speech and language deficits following cerebral infarction: Secondary | ICD-10-CM | POA: Diagnosis not present

## 2020-12-24 DIAGNOSIS — I428 Other cardiomyopathies: Secondary | ICD-10-CM | POA: Diagnosis not present

## 2020-12-24 DIAGNOSIS — Z9181 History of falling: Secondary | ICD-10-CM | POA: Diagnosis not present

## 2020-12-24 DIAGNOSIS — I110 Hypertensive heart disease with heart failure: Secondary | ICD-10-CM | POA: Diagnosis not present

## 2020-12-24 DIAGNOSIS — Z79891 Long term (current) use of opiate analgesic: Secondary | ICD-10-CM | POA: Diagnosis not present

## 2020-12-24 DIAGNOSIS — I272 Pulmonary hypertension, unspecified: Secondary | ICD-10-CM | POA: Diagnosis not present

## 2020-12-24 DIAGNOSIS — E785 Hyperlipidemia, unspecified: Secondary | ICD-10-CM | POA: Diagnosis not present

## 2020-12-24 DIAGNOSIS — Z7901 Long term (current) use of anticoagulants: Secondary | ICD-10-CM | POA: Diagnosis not present

## 2020-12-24 DIAGNOSIS — Z6841 Body Mass Index (BMI) 40.0 and over, adult: Secondary | ICD-10-CM | POA: Diagnosis not present

## 2020-12-24 DIAGNOSIS — Z86718 Personal history of other venous thrombosis and embolism: Secondary | ICD-10-CM | POA: Diagnosis not present

## 2020-12-24 DIAGNOSIS — I2510 Atherosclerotic heart disease of native coronary artery without angina pectoris: Secondary | ICD-10-CM | POA: Diagnosis not present

## 2020-12-24 DIAGNOSIS — I5022 Chronic systolic (congestive) heart failure: Secondary | ICD-10-CM | POA: Diagnosis not present

## 2020-12-24 NOTE — Patient Outreach (Signed)
[facility]) Care Management  12/24/2020  Laura [name] 07/20/1951 [id number]   [facility] incoming call from [facility] stroke referred patient On APL  Laura [name] was referred to [facility] on 12/04/20 for EMMI stroke  RED ON EMMI ALERT Day #9Date: Monday December 03 2020 1000 Red Alert Reason: questions/problems with meds? Yes Lost interest in things they used to enjoy? Yes Sad, hopeless, anxious, or empty? Yes    Insurance:Medicareand Aetna senior supplement [facility] x1ED visits x 1in the last 6 months  Last admission 11/20/20 to 11/23/20 for acute R Middle Cerebra Artery ischemic stroke s/p revascularization RM1, embolic d/t known AF, generalized anxiety disorder, hypertension, chronic atrial fibrillation, GERD, OSA on CPAP, morbid obesity, obstructive chronic bronchitis without exacerbation, PAD, Chronic systolic heart failure,  Hyperlipidemia, nonischemic cardiomyopathy  Incoming call from Laura [name]  She is able to verify HIPAA identifiers   Medication management/assistance needs Laura [name] reports cost concerns with Eliquis  She reports attempt to get the social security department to assist but not successful She reports only receiving $1450/month  Pcp= Dr Beverely Risen  Cardiology = Dr Lorine Bears  Pharmacy referral completed   Plan: Patient agrees to care plan and follow up [facility] RN CM scheduled this patient for another call attempt within 30-45 business days Pt encouraged to return a call to [facility] RN CM prn Routed note to MD Goals      Patient Stated   .  [facility] ) Keep or Improve My Strength-Stroke (pt-stated)      Timeframe:  Long-Range Goal Priority:  High Start Date:                   12/12/20          Expected End Date:             03/21/20          Follow Up Date 01/14/21   - attend 90 percent of occupational therapy appointments - attend 90 percent of physical therapy appointments - increase activity or exercise  time a little every week - plan activity for times when energy is the highest    Notes:     .  [facility]) Manage My Medicine (pt-stated)      Timeframe:  Short-Term Goal Priority:  High Start Date:                  12/24/20           Expected End Date:          02/18/21             Follow Up Date 01/14/21   - call for medicine refill 2 or 3 days before it runs out    Notes:        Oreatha Fabry L. Noelle Penner, RN, BSN, CCM [facility] Telephonic Care Management Care Coordinator Office number [phone] [facility] number [phone] Fax number [phone]

## 2020-12-24 NOTE — Telephone Encounter (Signed)
Per taylor I called pt and informed her to stop taking the theophyline and to take prednisone only every other day.  Pt understood and was happy bc the theophylline keeps making her nauseated.

## 2020-12-25 ENCOUNTER — Other Ambulatory Visit: Payer: Self-pay | Admitting: Adult Health

## 2020-12-25 ENCOUNTER — Telehealth: Payer: Self-pay | Admitting: Family

## 2020-12-25 ENCOUNTER — Other Ambulatory Visit: Payer: Self-pay | Admitting: Hospice and Palliative Medicine

## 2020-12-25 ENCOUNTER — Encounter: Payer: Self-pay | Admitting: Hospice and Palliative Medicine

## 2020-12-25 DIAGNOSIS — I69392 Facial weakness following cerebral infarction: Secondary | ICD-10-CM | POA: Diagnosis not present

## 2020-12-25 DIAGNOSIS — R1312 Dysphagia, oropharyngeal phase: Secondary | ICD-10-CM | POA: Diagnosis not present

## 2020-12-25 DIAGNOSIS — R0602 Shortness of breath: Secondary | ICD-10-CM

## 2020-12-25 DIAGNOSIS — I69312 Visuospatial deficit and spatial neglect following cerebral infarction: Secondary | ICD-10-CM | POA: Diagnosis not present

## 2020-12-25 DIAGNOSIS — I69391 Dysphagia following cerebral infarction: Secondary | ICD-10-CM | POA: Diagnosis not present

## 2020-12-25 DIAGNOSIS — I69354 Hemiplegia and hemiparesis following cerebral infarction affecting left non-dominant side: Secondary | ICD-10-CM | POA: Diagnosis not present

## 2020-12-25 DIAGNOSIS — R0902 Hypoxemia: Secondary | ICD-10-CM

## 2020-12-25 DIAGNOSIS — I69328 Other speech and language deficits following cerebral infarction: Secondary | ICD-10-CM | POA: Diagnosis not present

## 2020-12-25 MED ORDER — DILTIAZEM HCL ER COATED BEADS 360 MG PO CP24: 360.0000 mg | ORAL_CAPSULE | Freq: Every day | ORAL | 2 refills | Status: DC

## 2020-12-25 NOTE — Telephone Encounter (Signed)
Patient is returning your call.  

## 2020-12-25 NOTE — Telephone Encounter (Signed)
Patient calling to say the price of her [facility]iltiazem at [facility] has gone up to $70 for one month supply. She can get 90 day supply at [facility] pharmacy for $40. She would like me to send the prescription there.  However, in the meantime she only has 2 pills left. She would like to know if she can take an past prescription of diltiazem hydrochloride 90 mg tablets instead.  Advised I will route to provider to recommendations and possible dosing.

## 2020-12-25 NOTE — Telephone Encounter (Signed)
[facility] has diltiazem at $13.28 with their savings applied for 1 month and $20.45 for a [facility] day supply at [facility], as well as offers locations with cheaper options if using Goodrx. Has she tried this option?  Do we have an expiration date on her previous prescription? She can certainly take short acting / immediate release diltiazem 90mg  tablet four times per day (total 360mg  daily) to patch her through the next few days, as long as the prescription is not expired.  I would caution against taking expired medication.

## 2020-12-25 NOTE — Telephone Encounter (Signed)
Agree with Jacquelyn's recommendations.   Okay to send prescription to [facility] for 90 day supply with 1-2 refills.  In the interim, may send 30 day supply to local pharmacy (cheapest 30 day supply of her dose of Diltiazem is [facility]) or she may utilize short acting Diltiazem 90mg  four times per day so long as not expired.   , NP

## 2020-12-25 NOTE — Telephone Encounter (Signed)
Patient checked her bottle of short acting diltiazem and it does not expire until 04/2021. She would rather take what she has 4 times a day than use the GoodRx coupon at this time. She has already been notified from [facility] and she expects she will get the refill in the next week.

## 2020-12-25 NOTE — Patient Outreach (Signed)
[facility]) Care Management  12/25/2020  Laura [name] 10/07/1951 [id number]   Referral to [facility] per Edd Arbour, RN for medication assistance sent 12/25/2020.  Baruch Gouty [facility] Management Assistant [phone]

## 2020-12-25 NOTE — Telephone Encounter (Addendum)
Patient answered and is currently with her therapist. She will call back.

## 2020-12-25 NOTE — Telephone Encounter (Signed)
Patient has a question regarding diltiazem . Please call to discuss.

## 2020-12-25 NOTE — Progress Notes (Signed)
Put new order for oxygen

## 2020-12-27 DIAGNOSIS — R1312 Dysphagia, oropharyngeal phase: Secondary | ICD-10-CM | POA: Diagnosis not present

## 2020-12-27 DIAGNOSIS — I69312 Visuospatial deficit and spatial neglect following cerebral infarction: Secondary | ICD-10-CM | POA: Diagnosis not present

## 2020-12-27 DIAGNOSIS — I69328 Other speech and language deficits following cerebral infarction: Secondary | ICD-10-CM | POA: Diagnosis not present

## 2020-12-27 DIAGNOSIS — I69354 Hemiplegia and hemiparesis following cerebral infarction affecting left non-dominant side: Secondary | ICD-10-CM | POA: Diagnosis not present

## 2020-12-27 DIAGNOSIS — I69392 Facial weakness following cerebral infarction: Secondary | ICD-10-CM | POA: Diagnosis not present

## 2020-12-27 DIAGNOSIS — I69391 Dysphagia following cerebral infarction: Secondary | ICD-10-CM | POA: Diagnosis not present

## 2020-12-28 ENCOUNTER — Other Ambulatory Visit: Payer: Self-pay

## 2020-12-28 ENCOUNTER — Telehealth: Payer: Self-pay

## 2020-12-28 DIAGNOSIS — I69391 Dysphagia following cerebral infarction: Secondary | ICD-10-CM | POA: Diagnosis not present

## 2020-12-28 DIAGNOSIS — I69392 Facial weakness following cerebral infarction: Secondary | ICD-10-CM | POA: Diagnosis not present

## 2020-12-28 DIAGNOSIS — I69312 Visuospatial deficit and spatial neglect following cerebral infarction: Secondary | ICD-10-CM | POA: Diagnosis not present

## 2020-12-28 DIAGNOSIS — R1312 Dysphagia, oropharyngeal phase: Secondary | ICD-10-CM | POA: Diagnosis not present

## 2020-12-28 DIAGNOSIS — I69328 Other speech and language deficits following cerebral infarction: Secondary | ICD-10-CM | POA: Diagnosis not present

## 2020-12-28 DIAGNOSIS — I69354 Hemiplegia and hemiparesis following cerebral infarction affecting left non-dominant side: Secondary | ICD-10-CM | POA: Diagnosis not present

## 2020-12-28 MED ORDER — THEOPHYLLINE ER 100 MG PO CP24: 100.0000 mg | ORAL_CAPSULE | Freq: Every day | ORAL | 3 refills | Status: DC

## 2020-12-28 MED ORDER — AZITHROMYCIN 250 MG PO TABS: ORAL_TABLET | ORAL | 0 refills | Status: DC

## 2020-12-28 NOTE — Telephone Encounter (Signed)
Faxed [facility] for conserving device

## 2020-12-28 NOTE — Telephone Encounter (Signed)
Pt called and advised she is struggling with her breathing again, pulseox has been staying around 89-90 with not getting over 91-92 with HR 50-80.  She also c/o having brown mucus coming up with her coughing, using continuous oxygen at 2.5 liters.  Pt is asking about theophyline if she needs to start back bc she is struggling so bad with her breathing.  I spoke to [facility] and she asked to check with pharmacy on a lower dose of theophyline, I called and [facility] [place] [facility] and [facility] had theo-24 100 mg, so per DFK we added the rx to pts and stopped the theophyline 300 mg.  I informed pt that we are starting her on theo-24 100 mg once a day and also we sent in a rx for Zithromax 250 mg once a day for 10 days.  Asked pt to let us know how that helps.

## 2020-12-31 DIAGNOSIS — I69312 Visuospatial deficit and spatial neglect following cerebral infarction: Secondary | ICD-10-CM | POA: Diagnosis not present

## 2020-12-31 DIAGNOSIS — I69391 Dysphagia following cerebral infarction: Secondary | ICD-10-CM | POA: Diagnosis not present

## 2020-12-31 DIAGNOSIS — R1312 Dysphagia, oropharyngeal phase: Secondary | ICD-10-CM | POA: Diagnosis not present

## 2020-12-31 DIAGNOSIS — I69392 Facial weakness following cerebral infarction: Secondary | ICD-10-CM | POA: Diagnosis not present

## 2020-12-31 DIAGNOSIS — I69354 Hemiplegia and hemiparesis following cerebral infarction affecting left non-dominant side: Secondary | ICD-10-CM | POA: Diagnosis not present

## 2020-12-31 DIAGNOSIS — I69328 Other speech and language deficits following cerebral infarction: Secondary | ICD-10-CM | POA: Diagnosis not present

## 2021-01-01 ENCOUNTER — Telehealth: Payer: Self-pay

## 2021-01-01 DIAGNOSIS — I69392 Facial weakness following cerebral infarction: Secondary | ICD-10-CM | POA: Diagnosis not present

## 2021-01-01 DIAGNOSIS — I69391 Dysphagia following cerebral infarction: Secondary | ICD-10-CM | POA: Diagnosis not present

## 2021-01-01 DIAGNOSIS — I69328 Other speech and language deficits following cerebral infarction: Secondary | ICD-10-CM | POA: Diagnosis not present

## 2021-01-01 DIAGNOSIS — I69312 Visuospatial deficit and spatial neglect following cerebral infarction: Secondary | ICD-10-CM | POA: Diagnosis not present

## 2021-01-01 DIAGNOSIS — I69354 Hemiplegia and hemiparesis following cerebral infarction affecting left non-dominant side: Secondary | ICD-10-CM | POA: Diagnosis not present

## 2021-01-01 DIAGNOSIS — R1312 Dysphagia, oropharyngeal phase: Secondary | ICD-10-CM | POA: Diagnosis not present

## 2021-01-01 NOTE — Telephone Encounter (Signed)
PA for THEO 24 capsules ER 24 HR was approved on 01/01/2021 coverage is from 12/22/2020 to 01/01/2022

## 2021-01-02 ENCOUNTER — Ambulatory Visit (INDEPENDENT_AMBULATORY_CARE_PROVIDER_SITE_OTHER): Payer: Medicare Other | Admitting: Adult Health

## 2021-01-02 ENCOUNTER — Encounter: Payer: Self-pay | Admitting: Adult Health

## 2021-01-02 VITALS — BP 144/78 | HR 59 | Ht 64.0 in | Wt 238.8 lb

## 2021-01-02 DIAGNOSIS — I639 Cerebral infarction, unspecified: Secondary | ICD-10-CM | POA: Diagnosis not present

## 2021-01-02 DIAGNOSIS — I10 Essential (primary) hypertension: Secondary | ICD-10-CM

## 2021-01-02 DIAGNOSIS — R112 Nausea with vomiting, unspecified: Secondary | ICD-10-CM

## 2021-01-02 DIAGNOSIS — E785 Hyperlipidemia, unspecified: Secondary | ICD-10-CM

## 2021-01-02 DIAGNOSIS — I480 Paroxysmal atrial fibrillation: Secondary | ICD-10-CM

## 2021-01-02 NOTE — Progress Notes (Signed)
[facility] [address] [place]. [place] [address] [phone]       HOSPITAL FOLLOW UP NOTE  Laura [name] Date of Birth:  08/29/1951 Medical Record Number:  [id number]   Reason for Referral:  hospital stroke follow up    SUBJECTIVE:   CHIEF COMPLAINT:  Chief Complaint  Patient presents with  . Follow-up    Treatment room alone Pt is well, hasn't been able to eat since stroke. She cant keep anything down. Lost 50lbs    HPI:   LauraNataliee SHERETTA [name] a 70 y.o.femalewith history of chronic atrial fibrillation on Eliquis, COPD, gastroesophageal reflux disease, heart failure with midrange ejection fraction, hypertension, hyperlipidemia, nonischemic cardiomyopathy, obstructive sleep apnea, obesity, who presented on 11/20/2020 with L sided weakness w/ R gaze preference, slurred speech and L facial droop.  Personally reviewed hospitalization pertinent progress notes, lab work and imaging with summary provided.  Evaluated by Dr. Erlinda Hong with stroke work-up revealing patchy right MCA infarcts due to right M1 occlusion s/p IR w/ TICI 3 revascularization, infarct embolic secondary to known AF.  Post IR small SAH post  persylvian fissure.  Eliquis was held 11/20-11/24 for lumbar injection and restarted 11/25 but apparently few episodes of left hand weakness and some episodic HA and confusion.  Advised to resume Eliquis 12/5 (5 days post stroke) for secondary stroke prevention.  History of HTN and resumed home dose Cardizem, Lasix and spironolactone.  History of HLD on Crestor 20 mg daily with LDL 80.  No prior history of DM.  Other stroke risk factors include advanced age, morbid obesity, OSA on CPAP, CAD, systolic CHF, NICM with a EF 40 to 45%, hx of DVT and PAD.  No prior stroke history.  Other problems include anxiety, COPD and GERD.  Residual deficits of mild left lower facial weakness, left hemiparesis and dysphagia.  Per therapy recommendations, she was discharged home  with [facility] PT/OT/SLP.  Stroke:PatchyR MCA infarctsdue to right M1 occlusion s/p IR w/ TICI3 revascularization, infarct embolic secondary to known AF   Code Stroke CT head No acute abnormality. Dense R M1.ASPECTS 10.   CTA head &neck ELVO R M1. Mild ICA atherosclerosis. L subclavian origin 55% stenosis.  Cerebral angio / IR TICI3 revascularization R MCA occlusion  Post IR CT small SAH post perisylvian fissurew/ contrast stain R parietal subcortical region  MRIpatchy R MCA territory infarcts w/ small SAH  MRApatent R MCA  2D EchoEF 45 to 50%  LDL80  HgbA1c6.2  Eliquis (apixaban) dailyprior to admission, now on aspirin 325. Resume Eliquis Sunday morning, Dec 5, stop aspirin at that time.   Therapy recommendations:HH OT, HH PT, HH SLP  Disposition: return home   Today, 01/02/2021, Ms. [name] is being seen for hospital follow-up unaccompanied. Her main concern today is nausea with altered taste and smell limiting oral intake which has been present since her stroke. She reports 30 lb weight loss over the past 2 months.  She denies dizziness, vertigo or any other associated symptom contributing to nausea.  She is currently receiving therapy at home for residual stroke deficits of mild left sided weakness and gait impairment. Denies residual dysphagia. Use of rollator walker and at times has been using cane.  Denies new or worsening stroke/TIA symptoms.  She has remained on Eliquis 5 mg twice daily without bleeding or bruising.  Remains on Crestor 20 mg daily without myalgias.  Blood pressure today 144/78.  Reports nightly use of CPAP for OSA management.  No further concerns  at this time.    ROS:   14 system review of systems performed and negative with exception of those listed in HPI  PMH:  Past Medical History:  Diagnosis Date  . Asthma   . Chronic atrial fibrillation ([place])    a. diagnosed in 09/2016; b. failed flecainide and propafenone due to LE swelling and  SOB, could not afford Multaq; c. CHADS2VASc => 5 (CHF, HTN, age x 1, nonobs CAD, female)--> Eliquis  . COPD (chronic obstructive pulmonary disease) ([place])   . GERD (gastroesophageal reflux disease)   . HFmrEF (heart failure with mid-range ejection fraction) ([place])    a. 12/2019 Echo: EF 40-45%.  . Hyperlipidemia   . Hypertension   . NICM (nonischemic cardiomyopathy) ([place])    a. 12/2019 Echo: EF 40-45%, glob HK, mildly reduced RV fxn, sev dil LA. *HR 130 (afib) during study.  . Nonobstructive CAD (coronary artery disease)    a. Lexiscan Myoview 10/2016: no evidence of ischemia, EF 53%; b. 02/2020 Cath: LM nl, LAD 20p, 75m, LCX 20ost, OM1/2/3 nl, LPDA nl, LPL1/2 nl, LPAV nl, RCA small, nl.  . Obesity   . Obstructive sleep apnea   . Pulmonary hypertension ([place])   . Sleep apnea   . Stroke ([place])   . Systolic dysfunction    a. TTE 10/2016: EF 50%, mild LVH, moderately dilated LA, moderate MR/TR, mild pulmonary hypertension    PSH:  Past Surgical History:  Procedure Laterality Date  . BOWEL RESECTION  09/11/2019   Procedure: SMALL BOWEL RESECTION;  Surgeon: Herbert Pun, MD;  Location: [facility] ORS;  Service: General;;  . CARDIAC CATHETERIZATION    . cataract surgery    . COLONOSCOPY WITH PROPOFOL N/A 03/19/2020   Procedure: COLONOSCOPY WITH PROPOFOL;  Surgeon: [name], MD;  Location[facility] ENDOSCOPY;  Service: Gastroenterology;  Laterality: N/A;  . CORONARY ANGIOPLASTY    . INCISION AND DRAINAGE ABSCESS Right 06/29/2016   Procedure: INCISION AND DRAINAGE ABSCESS;  Surgeon: Florene Glen, MD;  Location: [facility] ORS;  Service: General;  Laterality: Right;  . INCISION AND DRAINAGE OF WOUND Left 06/29/2016   Procedure: IRRIGATION AND DEBRIDEMENT WOUND;  Surgeon: Florene Glen, MD;  Location: [facility] ORS;  Service: General;  Laterality: Left;  . IR ANGIO VERTEBRAL SEL SUBCLAVIAN INNOMINATE UNI R MOD SED  11/20/2020  . IR CT HEAD LTD  11/20/2020  . IR PERCUTANEOUS ART THROMBECTOMY/INFUSION  INTRACRANIAL INC DIAG ANGIO  11/20/2020  . LAPAROSCOPIC RIGHT COLECTOMY  09/11/2019   Procedure: RIGHT COLECTOMY;  Surgeon: Herbert Pun, MD;  Location: [facility] ORS;  Service: General;;  . LAPAROSCOPY N/A 09/11/2019   Procedure: LAPAROSCOPY DIAGNOSTIC;  Surgeon: Herbert Pun, MD;  Location: [facility] ORS;  Service: General;  Laterality: N/A;  . LAPAROTOMY N/A 09/13/2019   Procedure: REOPENING OF RECENT LAPAROTOMYANASTOMOSIS OF BOWEL;  Surgeon: Herbert Pun, MD;  Location: [facility] ORS;  Service: General;  Laterality: N/A;  . RADIOLOGY WITH ANESTHESIA N/A 11/20/2020   Procedure: IR WITH ANESTHESIA - CODE STROKE;  Surgeon: Radiologist, Medication, MD;  Location: [place];  Service: Radiology;  Laterality: N/A;  . RIGHT/LEFT HEART CATH AND CORONARY ANGIOGRAPHY Bilateral 02/27/2020   Procedure: RIGHT/LEFT HEART CATH AND CORONARY ANGIOGRAPHY;  Surgeon: Wellington Hampshire, MD;  Location: [place] CV LAB;  Service: Cardiovascular;  Laterality: Bilateral;  . VISCERAL ANGIOGRAPHY N/A 09/12/2019   Procedure: VISCERAL ANGIOGRAPHY;  Surgeon: Algernon Huxley, MD;  Location: [place] CV LAB;  Service: Cardiovascular;  Laterality: N/A;    Social History:  Social  History   Socioeconomic History  . Marital status: Married    Spouse name: [name]   . Number of children: 2  . Years of education: Not on file  . Highest education level: Not on file  Occupational History  . 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 Smoking status: Never Smoker  . Smokeless tobacco: Never Used  Vaping Use  . Vaping Use: Never used  Substance and Sexual Activity  . Alcohol use: No  . Drug use: No  . Sexual activity: Not on file  Other Topics Concern  . Not on file  Social History Narrative   3 grandchildren: 7 great grandkids.    Social Determinants of Health   Financial Resource Strain: Low Risk   . Difficulty of Paying  Living Expenses: Not hard at all  Food Insecurity: No Food Insecurity  . Worried About Charity fundraiser in the Last Year: Never true  . Ran Out of Food in the Last Year: Never true  Transportation Needs: No Transportation Needs  . Lack of Transportation (Medical): No  . Lack of Transportation (Non-Medical): No  Physical Activity: Not on file  Stress: No Stress Concern Present  . Feeling of Stress : Only a little  Social Connections: Socially Integrated  . Frequency of Communication with Friends and Family: More than three times a week  . Frequency of Social Gatherings with Friends and Family: More than three times a week  . Attends Religious Services: More than 4 times per year  . Active Member of Clubs or Organizations: Yes  . Attends Archivist Meetings: More than 4 times per year  . Marital Status: Married  Human resources officer Violence: Not At Risk  . Fear of Current or Ex-Partner: No  . Emotionally Abused: No  . Physically Abused: No  . Sexually Abused: No    Family History:  Family History  Problem Relation Age of Onset  . Dementia Mother   . Osteoporosis Mother   . Vascular Disease Mother   . COPD Father   . Heart disease Brother     Medications:   Current Outpatient Medications on File Prior to Visit  Medication Sig Dispense Refill  . Accu-Chek Softclix Lancets lancets Use as instructed to check blood sugars daily 2 hours after meal 100 each 12  . albuterol (VENTOLIN HFA) 108 (90 Base) MCG/ACT inhaler Inhale 2 puffs into the lungs every 4 (four) hours as needed for wheezing or shortness of breath. 18 g 3  . ALPRAZolam (XANAX) 0.25 MG tablet Take 0.25 mg by mouth 2 (two) times daily as needed for anxiety.    Marland Kitchen apixaban (ELIQUIS) 5 MG TABS tablet Take 1 tablet (5 mg total) by mouth 2 (two) times daily. 60 tablet 6  . azithromycin (ZITHROMAX) 250 MG tablet Take one tablet by mouth once daily for 10 days 10 each 0  . benzonatate (TESSALON) 100 MG capsule Take 1  capsule (100 mg total) by mouth 2 (two) times daily as needed for cough. 20 capsule 2  . digoxin (LANOXIN) 0.125 MG tablet Take 1 tablet (0.125 mg total) by mouth daily. 90 tablet 1  . diltiazem (CARDIZEM CD) 360 MG 24 hr capsule Take 1 capsule (360 mg total) by mouth daily. 90 capsule 2  . EPINEPHrine 0.3 mg/0.3 mL IJ SOAJ injection Inject 0.3 mg into the muscle as needed for anaphylaxis.     Marland Kitchen  gabapentin (NEURONTIN) 100 MG capsule Take 1 capsule (100 mg total) by mouth 3 (three) times daily. 90 capsule 3  . glucose blood (ACCU-CHEK GUIDE) test strip Use as instructed to check blood sugars daily 2 hours after meal 100 each 12  . ipratropium-albuterol (DUONEB) 0.5-2.5 (3) MG/3ML SOLN Take 3 mLs by nebulization every 4 (four) hours as needed. 120 mL 3  . loperamide (IMODIUM) 2 MG capsule Take 4 mg by mouth as needed for diarrhea or loose stools.    Marland Kitchen losartan (COZAAR) 25 MG tablet Take 1 tablet (25 mg total) by mouth daily. 90 tablet 1  . Melatonin 10 MG TABS Take 10 mg by mouth at bedtime as needed.    . montelukast (SINGULAIR) 10 MG tablet Take 1 tablet (10 mg total) by mouth daily. 90 tablet 1  . ondansetron (ZOFRAN) 4 MG tablet Take 1 tablet (4 mg total) by mouth every 8 (eight) hours as needed for nausea or vomiting. 60 tablet 1  . Probiotic Product (PROBIOTIC-10) CAPS Take 1 capsule by mouth daily.     . promethazine (PHENERGAN) 12.5 MG tablet Take 1 tablet (12.5 mg total) by mouth every 12 (twelve) hours as needed for nausea or vomiting. 30 tablet 0  . rosuvastatin (CRESTOR) 20 MG tablet Take 1 tablet (20 mg total) by mouth daily. 90 tablet 3  . theophylline (THEO-24) 100 MG 24 hr capsule Take 1 capsule (100 mg total) by mouth daily. 30 capsule 3  . traMADol (ULTRAM) 50 MG tablet Take 50 mg by mouth every 6 (six) hours as needed for moderate pain.     . budesonide (PULMICORT) 0.25 MG/2ML nebulizer solution Take 2 mLs (0.25 mg total) by nebulization 2 (two) times daily. (Patient not taking:  Reported on 01/02/2021) 60 mL 12  . predniSONE (DELTASONE) 10 MG tablet Take 1 tablet (10 mg total) by mouth daily with breakfast. (Patient not taking: Reported on 01/02/2021) 30 tablet 0   No current facility-administered medications on file prior to visit.    Allergies:   Allergies  Allergen Reactions  . Flecainide Shortness Of Breath  . Metoprolol Shortness Of Breath, Swelling and Other (See Comments)    "My limbs swell" also  . Propafenone Shortness Of Breath, Swelling and Other (See Comments)    "My limbs swell" also  . Rivaroxaban Swelling and Other (See Comments)    Xarelto- "My limbs swell"      OBJECTIVE:  Physical Exam  Vitals:   01/02/21 1012  BP: (!) 144/78  Pulse: (!) 59  Weight: 238 lb 12.8 oz (108.3 kg)  Height: 5\' 4"  (1.626 m)   Body mass index is 40.99 kg/m. No exam data present  General: Morbidly obese pleasantly anxious elderly Caucasian female, seated Head: head normocephalic and atraumatic.   Neck: supple with no carotid or supraclavicular bruits Cardiovascular: regular rate and rhythm, no murmurs Musculoskeletal: no deformity Skin:  no rash/petichiae Vascular:  Normal pulses all extremities   Neurologic Exam Mental Status: Awake and fully alert. Fluent speech and language. Oriented to place and time. Recent and remote memory intact. Attention span, concentration and fund of knowledge appropriate. Mood and affect appropriate.  Cranial Nerves: Fundoscopic exam reveals sharp disc margins. Pupils equal, briskly reactive to light. Extraocular movements full without nystagmus. Visual fields full to confrontation. Hearing intact. Facial sensation intact. Face, tongue, palate moves normally and symmetrically.  Motor: Normal bulk and tone. Normal strength on right side.  LUE: 4/5.  LLE: 4+/5 hip flexor otherwise 5/5 Sensory.:  intact to touch , pinprick , position and vibratory sensation.  Coordination: Rapid alternating movements normal in all extremities  except slightly decreased left hand. Finger-to-nose and heel-to-shin performed accurately bilaterally.  Orbits right arm over left arm. Gait and Station: Arises from chair without difficulty. Stance is normal. Gait demonstrates  decreased stride length with normal balance and use of Rollator walker Reflexes: 1+ and symmetric. Toes downgoing.     NIHSS  0 Modified Rankin  2      ASSESSMENT: Laura [name] is a 70 y.o. year old female presented with left-sided weakness w/ right gaze preference, slurred speech and left facial droop on 11/20/2020 with stroke work-up revealing patchy right MCA infarcts due to right M1 occlusion s/p IR with TICI 3 revascularization, infarct embolic secondary to known AF. Vascular risk factors include A. fib, HTN, HLD, morbid obesity, systolic CHF, nonischemic cardiomyopathy, hx of DVT, PAD and OSA on CPAP.      PLAN:  1. R MCA stroke :  a. Residual deficit: Mild left hemiparesis and gait impairment.  Encourage continued participation with [facility] PT for hopeful ongoing improvement b. Continue Eliquis (apixaban) daily  and Crestor 20 mg daily for secondary stroke prevention.   c. Discussed secondary stroke prevention measures and importance of close PCP follow up for aggressive stroke risk factor management  2. Atrial fibrillation: Remains on Eliquis per cardiology 3. Nausea: Patient believes due to her recent stroke due to onset during admission.  Discuss further with Dr. [name] Man who did not feel as though her recent stroke is the cause of her nausea with altered taste and smell.  She does have extensive GI history consisting of GERD, IBS with constipation and diarrhea and small bowel ischemia s/p resection 10/2019.  She is also on multiple medications for multiple comorbidities.  She has not been using her PPI but does not feel as though this is related.  She has been using Phenergan and Zofran with some benefit.  Encouraged follow-up with PCP as she may need referral  to GI or ENT for further evaluation 4. HTN: BP goal <130/90.  Stable on diltiazem and losartan per PCP 5. HLD: LDL goal <70. Recent LDL 80 on Crestor 20 mg daily per PCP.     Follow up in 3 months or call earlier if needed   CC:  [facility] provider: Dr. Sigmund Hazel, Timoteo Gaul, MD    I spent 50 minutes of face-to-face and non-face-to-face time with patient.  This included previsit chart review including recent hospitalization pertinent progress notes, lab work and imaging, lab review, study review, order entry, electronic health record documentation, patient education and discussion regarding recent stroke including etiology, residual deficits, long discussion regarding her main concern of nausea and vomiting with weight loss, discussed importance of managing stroke risk factors and answered all other questions to patient satisfaction  Frann Rider, AGNP-BC  [facility] [address] [place], [place] [address]  Phone [phone] Fax [phone] Note: This document was prepared with digital dictation and possible smart phrase technology. Any transcriptional errors that result from this process are unintentional.

## 2021-01-02 NOTE — Patient Instructions (Signed)
I will further discuss with Dr. [name] Man regarding your nausea and altered taste and smell for his opinion to see if this is something that could be from your stroke or if further evaluation is needed by other specialties such as gastroenterology or ear nose and throat doctor  Continue working with home health therapies for likely ongoing improvement  Continue Eliquis (apixaban) daily  and Crestor for secondary stroke prevention   Continue to follow up with PCP regarding cholesterol and blood pressure management  Maintain strict control of hypertension with blood pressure goal below 130/90 and cholesterol with LDL cholesterol (bad cholesterol) goal below 70 mg/dL.       Followup in the future with me in 3 months or call earlier if needed       Thank you for coming to see [place] at [facility]. I hope we have been able to provide you high quality care today.  You may receive a patient satisfaction survey over the next few weeks. We would appreciate your feedback and comments so that we may continue to improve ourselves and the health of our patients.

## 2021-01-03 ENCOUNTER — Ambulatory Visit (INDEPENDENT_AMBULATORY_CARE_PROVIDER_SITE_OTHER): Payer: Medicare Other | Admitting: Internal Medicine

## 2021-01-03 ENCOUNTER — Encounter: Payer: Self-pay | Admitting: Internal Medicine

## 2021-01-03 ENCOUNTER — Other Ambulatory Visit: Payer: Self-pay

## 2021-01-03 VITALS — BP 165/72 | HR 60 | Temp 97.5°F | Resp 16 | Ht 64.0 in | Wt 239.0 lb

## 2021-01-03 DIAGNOSIS — I69391 Dysphagia following cerebral infarction: Secondary | ICD-10-CM | POA: Diagnosis not present

## 2021-01-03 DIAGNOSIS — I63411 Cerebral infarction due to embolism of right middle cerebral artery: Secondary | ICD-10-CM

## 2021-01-03 DIAGNOSIS — R1312 Dysphagia, oropharyngeal phase: Secondary | ICD-10-CM

## 2021-01-03 DIAGNOSIS — I69312 Visuospatial deficit and spatial neglect following cerebral infarction: Secondary | ICD-10-CM | POA: Diagnosis not present

## 2021-01-03 DIAGNOSIS — G4733 Obstructive sleep apnea (adult) (pediatric): Secondary | ICD-10-CM | POA: Diagnosis not present

## 2021-01-03 DIAGNOSIS — I69392 Facial weakness following cerebral infarction: Secondary | ICD-10-CM | POA: Diagnosis not present

## 2021-01-03 DIAGNOSIS — I69354 Hemiplegia and hemiparesis following cerebral infarction affecting left non-dominant side: Secondary | ICD-10-CM | POA: Diagnosis not present

## 2021-01-03 DIAGNOSIS — R439 Unspecified disturbances of smell and taste: Secondary | ICD-10-CM | POA: Diagnosis not present

## 2021-01-03 DIAGNOSIS — I69328 Other speech and language deficits following cerebral infarction: Secondary | ICD-10-CM | POA: Diagnosis not present

## 2021-01-03 DIAGNOSIS — I639 Cerebral infarction, unspecified: Secondary | ICD-10-CM

## 2021-01-03 NOTE — Progress Notes (Signed)
I agree with the above plan 

## 2021-01-03 NOTE — Progress Notes (Addendum)
[facility] [place], [place] [address]  Pulmonary Sleep Medicine   Office Visit Note  Patient Name: Laura [name] DOB: 01-30-1951 MRN [id number]  Date of Service: 01/03/2021  Complaints/HPI: She had a recent stroke. She states that since that time she has been having difficult time with eating. She states that she can swallow with her nose closed pinched. She cannot stand the smell of food. She feels weaker. She states that she has not had any issues with her breathing but she has been having coughing. She was started on theo and this has helped her. She Also has OSA but has lost 34 pounds due to the stroke. She has been doing well with the CPAP  ROS  General: (-) fever, (-) chills, (-) night sweats, (-) weakness Skin: (-) rashes, (-) itching,. Eyes: (-) visual changes, (-) redness, (-) itching. Nose and Sinuses: (-) nasal stuffiness or itchiness, (-) postnasal drip, (-) nosebleeds, (-) sinus trouble. Mouth and Throat: (-) sore throat, (-) hoarseness. Neck: (-) swollen glands, (-) enlarged thyroid, (-) neck pain. Respiratory: - cough, (-) bloody sputum, - shortness of breath, - wheezing. Cardiovascular: - ankle swelling, (-) chest pain. Lymphatic: (-) lymph node enlargement. Neurologic: (-) numbness, (-) tingling. Psychiatric: (-) anxiety, (-) depression   Current Medication: Outpatient Encounter Medications as of 01/03/2021  Medication Sig  . Accu-Chek Softclix Lancets lancets Use as instructed to check blood sugars daily 2 hours after meal  . albuterol (VENTOLIN HFA) 108 (90 Base) MCG/ACT inhaler Inhale 2 puffs into the lungs every 4 (four) hours as needed for wheezing or shortness of breath.  . ALPRAZolam (XANAX) 0.25 MG tablet Take 0.25 mg by mouth 2 (two) times daily as needed for anxiety.  Marland Kitchen apixaban (ELIQUIS) 5 MG TABS tablet Take 1 tablet (5 mg total) by mouth 2 (two) times daily.  Marland Kitchen azithromycin (ZITHROMAX) 250 MG tablet Take one tablet by  mouth once daily for 10 days  . benzonatate (TESSALON) 100 MG capsule Take 1 capsule (100 mg total) by mouth 2 (two) times daily as needed for cough.  . budesonide (PULMICORT) 0.25 MG/2ML nebulizer solution Take 2 mLs (0.25 mg total) by nebulization 2 (two) times daily.  . digoxin (LANOXIN) 0.125 MG tablet Take 1 tablet (0.125 mg total) by mouth daily.  Marland Kitchen diltiazem (CARDIZEM CD) 360 MG 24 hr capsule Take 1 capsule (360 mg total) by mouth daily.  Marland Kitchen EPINEPHrine 0.3 mg/0.3 mL IJ SOAJ injection Inject 0.3 mg into the muscle as needed for anaphylaxis.   Marland Kitchen gabapentin (NEURONTIN) 100 MG capsule Take 1 capsule (100 mg total) by mouth 3 (three) times daily.  Marland Kitchen glucose blood (ACCU-CHEK GUIDE) test strip Use as instructed to check blood sugars daily 2 hours after meal  . ipratropium-albuterol (DUONEB) 0.5-2.5 (3) MG/3ML SOLN Take 3 mLs by nebulization every 4 (four) hours as needed.  . loperamide (IMODIUM) 2 MG capsule Take 4 mg by mouth as needed for diarrhea or loose stools.  Marland Kitchen losartan (COZAAR) 25 MG tablet Take 1 tablet (25 mg total) by mouth daily.  . Melatonin 10 MG TABS Take 10 mg by mouth at bedtime as needed.  . montelukast (SINGULAIR) 10 MG tablet Take 1 tablet (10 mg total) by mouth daily.  . ondansetron (ZOFRAN) 4 MG tablet Take 1 tablet (4 mg total) by mouth every 8 (eight) hours as needed for nausea or vomiting.  . predniSONE (DELTASONE) 10 MG tablet Take 1 tablet (10 mg total) by mouth daily with  breakfast.  . Probiotic Product (PROBIOTIC-10) CAPS Take 1 capsule by mouth daily.   . promethazine (PHENERGAN) 12.5 MG tablet Take 1 tablet (12.5 mg total) by mouth every 12 (twelve) hours as needed for nausea or vomiting.  . rosuvastatin (CRESTOR) 20 MG tablet Take 1 tablet (20 mg total) by mouth daily.  . theophylline (THEO-24) 100 MG 24 hr capsule Take 1 capsule (100 mg total) by mouth daily.  . traMADol (ULTRAM) 50 MG tablet Take 50 mg by mouth every 6 (six) hours as needed for moderate pain.     No facility-administered encounter medications on file as of 01/03/2021.    Surgical History: Past Surgical History:  Procedure Laterality Date  . BOWEL RESECTION  09/11/2019   Procedure: SMALL BOWEL RESECTION;  Surgeon: Carolan Shiver, MD;  Location: [facility] ORS;  Service: General;;  . CARDIAC CATHETERIZATION    . cataract surgery    . COLONOSCOPY WITH PROPOFOL N/A 03/19/2020   Procedure: COLONOSCOPY WITH PROPOFOL;  Surgeon: Wyline Mood, MD;  Location: [facility] ENDOSCOPY;  Service: Gastroenterology;  Laterality: N/A;  . CORONARY ANGIOPLASTY    . INCISION AND DRAINAGE ABSCESS Right 06/29/2016   Procedure: INCISION AND DRAINAGE ABSCESS;  Surgeon: Lattie Haw, MD;  Location: [facility] ORS;  Service: General;  Laterality: Right;  . INCISION AND DRAINAGE OF WOUND Left 06/29/2016   Procedure: IRRIGATION AND DEBRIDEMENT WOUND;  Surgeon: Lattie Haw, MD;  Location: [facility] ORS;  Service: General;  Laterality: Left;  . IR ANGIO VERTEBRAL SEL SUBCLAVIAN INNOMINATE UNI R MOD SED  11/20/2020  . IR CT HEAD LTD  11/20/2020  . IR PERCUTANEOUS ART THROMBECTOMY/INFUSION INTRACRANIAL INC DIAG ANGIO  11/20/2020  . LAPAROSCOPIC RIGHT COLECTOMY  09/11/2019   Procedure: RIGHT COLECTOMY;  Surgeon: Carolan Shiver, MD;  Location: [facility] ORS;  Service: General;;  . LAPAROSCOPY N/A 09/11/2019   Procedure: LAPAROSCOPY DIAGNOSTIC;  Surgeon: Carolan Shiver, MD;  Location: [facility] ORS;  Service: General;  Laterality: N/A;  . LAPAROTOMY N/A 09/13/2019   Procedure: REOPENING OF RECENT LAPAROTOMYANASTOMOSIS OF BOWEL;  Surgeon: Carolan Shiver, MD;  Location: [facility] ORS;  Service: General;  Laterality: N/A;  . RADIOLOGY WITH ANESTHESIA N/A 11/20/2020   Procedure: IR WITH ANESTHESIA - CODE STROKE;  Surgeon: Radiologist, Medication, MD;  Location: [facility] OR;  Service: Radiology;  Laterality: N/A;  . RIGHT/LEFT HEART CATH AND CORONARY ANGIOGRAPHY Bilateral 02/27/2020   Procedure: RIGHT/LEFT HEART CATH AND CORONARY  ANGIOGRAPHY;  Surgeon: Iran Ouch, MD;  Location: [facility] INVASIVE CV LAB;  Service: Cardiovascular;  Laterality: Bilateral;  . VISCERAL ANGIOGRAPHY N/A 09/12/2019   Procedure: VISCERAL ANGIOGRAPHY;  Surgeon: Annice Needy, MD;  Location: [facility] INVASIVE CV LAB;  Service: Cardiovascular;  Laterality: N/A;    Medical History: Past Medical History:  Diagnosis Date  . Asthma   . Chronic atrial fibrillation (HCC)    a. diagnosed in 09/2016; b. failed flecainide and propafenone due to LE swelling and SOB, could not afford Multaq; c. CHADS2VASc => 5 (CHF, HTN, age x 1, nonobs CAD, female)--> Eliquis  . COPD (chronic obstructive pulmonary disease) (HCC)   . GERD (gastroesophageal reflux disease)   . HFmrEF (heart failure with mid-range ejection fraction) (HCC)    a. 12/2019 Echo: EF 40-45%.  . Hyperlipidemia   . Hypertension   . NICM (nonischemic cardiomyopathy) (HCC)    a. 12/2019 Echo: EF 40-45%, glob HK, mildly reduced RV fxn, sev dil LA. *HR 130 (afib) during study.  . Nonobstructive CAD (coronary artery disease)    a. Lexiscan  Myoview 10/2016: no evidence of ischemia, EF 53%; b. 02/2020 Cath: LM nl, LAD 20p, 31m LCX 20ost, OM1/2/3 nl, LPDA nl, LPL1/2 nl, LPAV nl, RCA small, nl.  . Obesity   . Obstructive sleep apnea   . Pulmonary hypertension ([place]   . Sleep apnea   . Stroke ([place]   . Systolic dysfunction    a. TTE 10/2016: EF 50%, mild LVH, moderately dilated LA, moderate MR/TR, mild pulmonary hypertension    Family History: Family History  Problem Relation Age of Onset  . Dementia Mother   . Osteoporosis Mother   . Vascular Disease Mother   . COPD Father   . Heart disease Brother     Social History: Social History   Socioeconomic History  . Marital status: Married    Spouse name: DElenore Rota  . Number of children: 2  . Years of education: Not on file  . Highest education level: Not on file  Occupational History  . 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 Smoking status: Never Smoker  . Smokeless tobacco: Never Used  Vaping Use  . Vaping Use: Never used  Substance and Sexual Activity  . Alcohol use: No  . Drug use: No  . Sexual activity: Not on file  Other Topics Concern  . Not on file  Social History Narrative   3 grandchildren: 7 great grandkids.    Social Determinants of Health   Financial Resource Strain: Low Risk   . Difficulty of Paying Living Expenses: Not hard at all  Food Insecurity: No Food Insecurity  . Worried About RCharity fundraiserin the Last Year: Never true  . Ran Out of Food in the Last Year: Never true  Transportation Needs: No Transportation Needs  . Lack of Transportation (Medical): No  . Lack of Transportation (Non-Medical): No  Physical Activity: Not on file  Stress: No Stress Concern Present  . Feeling of Stress : Only a little  Social Connections: Socially Integrated  . Frequency of Communication with Friends and Family: More than three times a week  . Frequency of Social Gatherings with Friends and Family: More than three times a week  . Attends Religious Services: More than 4 times per year  . Active Member of Clubs or Organizations: Yes  . Attends CArchivistMeetings: More than 4 times per year  . Marital Status: Married  IHuman resources officerViolence: Not At Risk  . Fear of Current or Ex-Partner: No  . Emotionally Abused: No  . Physically Abused: No  . Sexually Abused: No    Vital Signs: Blood pressure (!) 165/72, pulse 60, temperature (!) 97.5 F (36.4 C), resp. rate 16, height _0  (1.626 m), weight 239 lb (108.4 kg), SpO2 97 %.  Examination: General Appearance: The patient is well-developed, well-nourished, and in no distress. Skin: Gross inspection of skin unremarkable. Head: normocephalic, no gross deformities. Eyes: no gross deformities noted. ENT: ears appear grossly normal no exudates. Neck: Supple. No  thyromegaly. No LAD. Respiratory: no rhonchi noted. Cardiovascular: Normal S1 and S2 without murmur or rub. Extremities: No cyanosis. pulses are equal. Neurologic: Alert and oriented. No involuntary movements.  LABS: Recent Results (from the past 2160 hour(s))  Comprehensive Metabolic Panel (CMET)     Status: Abnormal   Collection Time: 10/12/20 11:12 AM  Result Value Ref Range   Glucose 115 (H) 65 -  99 mg/dL   BUN 16 8 - 27 mg/dL   Creatinine, Ser 0.78 0.57 - 1.00 mg/dL   GFR calc non Af Amer 77 >59 mL/min/1.73   GFR calc Af Amer 89 >59 mL/min/1.73    Comment: **In accordance with recommendations from the NKF-ASN Task force,**   [facility] is in the process of updating its eGFR calculation to the   2021 CKD-EPI creatinine equation that estimates kidney function   without a race variable.    BUN/Creatinine Ratio 21 12 - 28   Sodium 140 134 - 144 mmol/L   Potassium 4.3 3.5 - 5.2 mmol/L   Chloride 101 96 - 106 mmol/L   CO2 23 20 - 29 mmol/L   Calcium 9.5 8.7 - 10.3 mg/dL   Total Protein 6.4 6.0 - 8.5 g/dL   Albumin 3.9 3.8 - 4.8 g/dL   Globulin, Total 2.5 1.5 - 4.5 g/dL   Albumin/Globulin Ratio 1.6 1.2 - 2.2   Bilirubin Total 0.5 0.0 - 1.2 mg/dL   Alkaline Phosphatase 74 44 - 121 IU/L    Comment:               **Please note reference interval change**   AST 14 0 - 40 IU/L   ALT 14 0 - 32 IU/L  CBC w/Diff/Platelet     Status: Abnormal   Collection Time: 10/12/20 11:12 AM  Result Value Ref Range   WBC 8.1 3.4 - 10.8 x10E3/uL   RBC 4.66 3.77 - 5.28 x10E6/uL   Hemoglobin 14.2 11.1 - 15.9 g/dL   Hematocrit 43.6 34.0 - 46.6 %   MCV 94 79 - 97 fL   MCH 30.5 26.6 - 33.0 pg   MCHC 32.6 31.5 - 35.7 g/dL   RDW 14.0 11.7 - 15.4 %   Platelets 357 150 - 450 x10E3/uL   Neutrophils 37 Not Estab. %   Lymphs 43 Not Estab. %   Monocytes 11 Not Estab. %   Eos 7 Not Estab. %   Basos 1 Not Estab. %   Neutrophils Absolute 3.0 1.4 - 7.0 x10E3/uL   Lymphocytes Absolute 3.6 (H) 0.7 - 3.1  x10E3/uL   Monocytes Absolute 0.9 0.1 - 0.9 x10E3/uL   EOS (ABSOLUTE) 0.6 (H) 0.0 - 0.4 x10E3/uL   Basophils Absolute 0.1 0.0 - 0.2 x10E3/uL   Immature Granulocytes 1 Not Estab. %   Immature Grans (Abs) 0.0 0.0 - 0.1 x10E3/uL  Vitamin B12     Status: Abnormal   Collection Time: 10/12/20 11:12 AM  Result Value Ref Range   Vitamin B-12 1,736 (H) 232 - 1,245 pg/mL  Folate     Status: None   Collection Time: 10/12/20 11:12 AM  Result Value Ref Range   Folate 14.4 >3.0 ng/mL    Comment: A serum folate concentration of less than 3.1 ng/mL is considered to represent clinical deficiency.   Iron and TIBC     Status: None   Collection Time: 10/12/20 11:12 AM  Result Value Ref Range   Total Iron Binding Capacity 347 250 - 450 ug/dL   UIBC 262 118 - 369 ug/dL   Iron 85 27 - 139 ug/dL   Iron Saturation 24 15 - 55 %  Ferritin     Status: None   Collection Time: 10/12/20 11:12 AM  Result Value Ref Range   Ferritin 49 15 - 150 ng/mL  POCT HgB A1C     Status: Abnormal   Collection Time: 10/24/20 10:50 AM  Result Value Ref Range  Hemoglobin A1C 6.3 (A) 4.0 - 5.6 %   HbA1c POC (<> result, manual entry)     HbA1c, POC (prediabetic range)     HbA1c, POC (controlled diabetic range)    POCT Urine Drug Screen     Status: Abnormal   Collection Time: 10/24/20 10:51 AM  Result Value Ref Range   POC Methamphetamine UR None Detected None Detected   POC Opiate Ur None Detected None Detected   POC Barbiturate UR None Detected None Detected   POC Amphetamine UR None Detected None Detected   POC Oxycodone UR None Detected None Detected   POC Cocaine UR None Detected None Detected   POC Ecstasy UR None Detected None Detected   POC TRICYCLICS UR Positive (A) None Detected   POC PHENCYCLIDINE UR None Detected None Detected   POC Marijuana UR None Detected None Detected   POC Methadone UR None Detected None Detected   POC BENZODIAZEPINES UR None Detected None Detected   URINE TEMPERATURE     POC DRUG  SCREEN OXIDANTS URINE     POC SPECIFIC GRAVITY URINE     POC PH URINE     Methylenedioxyamphetamine None Detected None Detected  CBG monitoring, ED     Status: Abnormal   Collection Time: 11/20/20 11:33 AM  Result Value Ref Range   Glucose-Capillary 152 (H) 70 - 99 mg/dL    Comment: Glucose reference range applies only to samples taken after fasting for at least 8 hours.  Protime-INR     Status: None   Collection Time: 11/20/20 11:35 AM  Result Value Ref Range   Prothrombin Time 14.8 11.4 - 15.2 seconds   INR 1.2 0.8 - 1.2    Comment: (NOTE) INR goal varies based on device and disease states. Performed at [facility], [place] [address], [place], [place] [address]   APTT     Status: Abnormal   Collection Time: 11/20/20 11:35 AM  Result Value Ref Range   aPTT 23 (L) 24 - 36 seconds    Comment: Performed at [facility] [address], [place], [place] [address]  CBC     Status: Abnormal   Collection Time: 11/20/20 11:35 AM  Result Value Ref Range   WBC 11.5 (H) 4.0 - 10.5 K/uL   RBC 5.06 3.87 - 5.11 MIL/uL   Hemoglobin 15.0 12.0 - 15.0 g/dL   HCT 49.0 (H) 36.0 - 46.0 %   MCV 96.8 80.0 - 100.0 fL   MCH 29.6 26.0 - 34.0 pg   MCHC 30.6 30.0 - 36.0 g/dL   RDW 14.1 11.5 - 15.5 %   Platelets 395 150 - 400 K/uL   nRBC 0.0 0.0 - 0.2 %    Comment: Performed at [facility], [place] [address] [place], [place] [address]  Differential     Status: Abnormal   Collection Time: 11/20/20 11:35 AM  Result Value Ref Range   Neutrophils Relative % 61 %   Neutro Abs 7.0 1.7 - 7.7 K/uL   Lymphocytes Relative 30 %   Lymphs Abs 3.4 0.7 - 4.0 K/uL   Monocytes Relative 6 %   Monocytes Absolute 0.7 0.1 - 1.0 K/uL   Eosinophils Relative 2 %   Eosinophils Absolute 0.2 0.0 - 0.5 K/uL   Basophils Relative 0 %   Basophils Absolute 0.1 0.0 - 0.1 K/uL   Immature Granulocytes 1 %   Abs Immature Granulocytes 0.13 (H) 0.00 - 0.07 K/uL    Comment:  Performed at [facility],  [place] [address] [place], [place] [address]  Comprehensive metabolic panel     Status: Abnormal   Collection Time: 11/20/20 11:35 AM  Result Value Ref Range   Sodium 136 135 - 145 mmol/L   Potassium 4.0 3.5 - 5.1 mmol/L   Chloride 103 98 - 111 mmol/L   CO2 21 (L) 22 - 32 mmol/L   Glucose, Bld 153 (H) 70 - 99 mg/dL    Comment: Glucose reference range applies only to samples taken after fasting for at least 8 hours.   BUN 16 8 - 23 mg/dL   Creatinine, Ser 1.06 (H) 0.44 - 1.00 mg/dL   Calcium 8.9 8.9 - 10.3 mg/dL   Total Protein 6.8 6.5 - 8.1 g/dL   Albumin 3.5 3.5 - 5.0 g/dL   AST 15 15 - 41 U/L   ALT 15 0 - 44 U/L   Alkaline Phosphatase 61 38 - 126 U/L   Total Bilirubin 0.8 0.3 - 1.2 mg/dL   GFR, Estimated 57 (L) >60 mL/min    Comment: (NOTE) Calculated using the CKD-EPI Creatinine Equation (2021)    Anion gap 12 5 - 15    Comment: Performed at [facility] [address] [place], [place] [address]  I-stat chem 8, ED     Status: Abnormal   Collection Time: 11/20/20 11:40 AM  Result Value Ref Range   Sodium 138 135 - 145 mmol/L   Potassium 3.7 3.5 - 5.1 mmol/L   Chloride 107 98 - 111 mmol/L   BUN 19 8 - 23 mg/dL   Creatinine, Ser 0.90 0.44 - 1.00 mg/dL   Glucose, Bld 148 (H) 70 - 99 mg/dL    Comment: Glucose reference range applies only to samples taken after fasting for at least 8 hours.   Calcium, Ion 1.02 (L) 1.15 - 1.40 mmol/L   TCO2 21 (L) 22 - 32 mmol/L   Hemoglobin 15.3 (H) 12.0 - 15.0 g/dL   HCT 45.0 36.0 - 46.0 %  Resp Panel by RT-PCR (Flu A&B, Covid) Nasopharyngeal Swab     Status: None   Collection Time: 11/20/20 12:03 PM   Specimen: Nasopharyngeal Swab; Nasopharyngeal(NP) swabs in vial transport medium  Result Value Ref Range   SARS Coronavirus 2 by RT PCR NEGATIVE NEGATIVE    Comment: (NOTE) SARS-CoV-2 target nucleic acids are NOT DETECTED.  The SARS-CoV-2 RNA is generally detectable in upper respiratory specimens during the acute phase of infection. The  lowest concentration of SARS-CoV-2 viral copies this assay can detect is 138 copies/mL. A negative result does not preclude SARS-Cov-2 infection and should not be used as the sole basis for treatment or other patient management decisions. A negative result may occur with  improper specimen collection/handling, submission of specimen other than nasopharyngeal swab, presence of viral mutation(s) within the areas targeted by this assay, and inadequate number of viral copies(<138 copies/mL). A negative result must be combined with clinical observations, patient history, and epidemiological information. The expected result is Negative.  Fact Sheet for Patients:  EntrepreneurPulse.com.au  Fact Sheet for Healthcare Providers:  IncredibleEmployment.be  This test is no t yet approved or cleared by the [place] FDA and  has been authorized for detection and/or diagnosis of SARS-CoV-2 by FDA under an Emergency Use Authorization (EUA). This EUA will remain  in effect (meaning this test can be used) for the duration of the COVID-19 declaration under Section 564(b)(1) of the Act, 21 U.S.C.section 360bbb-3(b)(1), unless  the authorization is terminated  or revoked sooner.       Influenza A by PCR NEGATIVE NEGATIVE   Influenza B by PCR NEGATIVE NEGATIVE    Comment: (NOTE) The Xpert Xpress SARS-CoV-2/FLU/RSV plus assay is intended as an aid in the diagnosis of influenza from Nasopharyngeal swab specimens and should not be used as a sole basis for treatment. Nasal washings and aspirates are unacceptable for Xpert Xpress SARS-CoV-2/FLU/RSV testing.  Fact Sheet for Patients: EntrepreneurPulse.com.au  Fact Sheet for Healthcare Providers: IncredibleEmployment.be  This test is not yet approved or cleared by the [place] FDA and has been authorized for detection and/or diagnosis of SARS-CoV-2 by FDA under an Emergency  Use Authorization (EUA). This EUA will remain in effect (meaning this test can be used) for the duration of the COVID-19 declaration under Section 564(b)(1) of the Act, 21 U.S.C. section 360bbb-3(b)(1), unless the authorization is terminated or revoked.  Performed at [facility], [place] [address] [place], [place] [address]   MRSA PCR Screening     Status: Abnormal   Collection Time: 11/20/20  5:50 PM   Specimen: Nasal Mucosa; Nasopharyngeal  Result Value Ref Range   MRSA by PCR POSITIVE (A) NEGATIVE    Comment:        The GeneXpert MRSA Assay (FDA approved for NASAL specimens only), is one component of a comprehensive MRSA colonization surveillance program. It is not intended to diagnose MRSA infection nor to guide or monitor treatment for MRSA infections. RESULT CALLED TO, READ BACK BY AND VERIFIED WITH: Bethann Humble RN 11/20/20 2103 JDW Performed at [facility], [place] [address], [place], [place] [address]   CBC with Differential/Platelet     Status: None   Collection Time: 11/21/20  5:00 AM  Result Value Ref Range   WBC 9.7 4.0 - 10.5 K/uL   RBC 4.42 3.87 - 5.11 MIL/uL   Hemoglobin 13.1 12.0 - 15.0 g/dL   HCT 40.7 36.0 - 46.0 %   MCV 92.1 80.0 - 100.0 fL   MCH 29.6 26.0 - 34.0 pg   MCHC 32.2 30.0 - 36.0 g/dL   RDW 14.3 11.5 - 15.5 %   Platelets 357 150 - 400 K/uL   nRBC 0.0 0.0 - 0.2 %   Neutrophils Relative % 57 %   Neutro Abs 5.5 1.7 - 7.7 K/uL   Lymphocytes Relative 31 %   Lymphs Abs 3.0 0.7 - 4.0 K/uL   Monocytes Relative 8 %   Monocytes Absolute 0.8 0.1 - 1.0 K/uL   Eosinophils Relative 3 %   Eosinophils Absolute 0.3 0.0 - 0.5 K/uL   Basophils Relative 0 %   Basophils Absolute 0.0 0.0 - 0.1 K/uL   Immature Granulocytes 1 %   Abs Immature Granulocytes 0.07 0.00 - 0.07 K/uL    Comment: Performed at [facility], [address] [place], [place] [address]  Basic metabolic panel     Status: Abnormal   Collection Time: 11/21/20  5:00 AM  Result  Value Ref Range   Sodium 140 135 - 145 mmol/L   Potassium 3.7 3.5 - 5.1 mmol/L   Chloride 106 98 - 111 mmol/L   CO2 22 22 - 32 mmol/L   Glucose, Bld 114 (H) 70 - 99 mg/dL    Comment: Glucose reference range applies only to samples taken after fasting for at least 8 hours.   BUN 20 8 - 23 mg/dL   Creatinine, Ser 0.93 0.44 - 1.00 mg/dL  Calcium 8.8 (L) 8.9 - 10.3 mg/dL   GFR, Estimated >60 >60 mL/min    Comment: (NOTE) Calculated using the CKD-EPI Creatinine Equation (2021)    Anion gap 12 5 - 15    Comment: Performed at [facility], [place] [address] [place], [place] [address]  Hemoglobin A1c     Status: Abnormal   Collection Time: 11/21/20  6:16 AM  Result Value Ref Range   Hgb A1c MFr Bld 6.2 (H) 4.8 - 5.6 %    Comment: (NOTE) Pre diabetes:          5.7%-6.4%  Diabetes:              >6.4%  Glycemic control for   <7.0% adults with diabetes    Mean Plasma Glucose 131.24 mg/dL    Comment: Performed at [facility] [address], [place], [place] [address]  Lipid panel     Status: None   Collection Time: 11/21/20  6:16 AM  Result Value Ref Range   Cholesterol 151 0 - 200 mg/dL   Triglycerides 125 <150 mg/dL   HDL 46 >40 mg/dL   Total CHOL/HDL Ratio 3.3 RATIO   VLDL 25 0 - 40 mg/dL   LDL Cholesterol 80 0 - 99 mg/dL    Comment:        Total Cholesterol/HDL:CHD Risk Coronary Heart Disease Risk Table                     Men   Women  1/2 Average Risk   3.4   3.3  Average Risk       5.0   4.4  2 X Average Risk   9.6   7.1  3 X Average Risk  23.4   11.0        Use the calculated Patient Ratio above and the CHD Risk Table to determine the patient's CHD Risk.        ATP III CLASSIFICATION (LDL):  <100     mg/dL   Optimal  100-129  mg/dL   Near or Above                    Optimal  130-159  mg/dL   Borderline  160-189  mg/dL   High  >190     mg/dL   Very High Performed at [facility] [address], [place], [place] [address]   HIV Antibody  (routine testing w rflx)     Status: None   Collection Time: 11/21/20  6:36 AM  Result Value Ref Range   HIV Screen 4th Generation wRfx Non Reactive Non Reactive    Comment: Performed at [name], [place] [address], [place], [place] [address]  ECHOCARDIOGRAM COMPLETE     Status: None   Collection Time: 11/21/20  3:37 PM  Result Value Ref Range   Weight 4,239.89 oz   Height 64 in   BP 136/69 mmHg   S' Lateral 3.90 cm   Area-P 1/2 2.62 cm2  Glucose, capillary     Status: Abnormal   Collection Time: 11/22/20 11:37 PM  Result Value Ref Range   Glucose-Capillary 117 (H) 70 - 99 mg/dL    Comment: Glucose reference range applies only to samples taken after fasting for at least 8 hours.  Glucose, capillary     Status: Abnormal   Collection Time: 11/23/20  6:15 AM  Result Value Ref Range   Glucose-Capillary 133 (H)  70 - 99 mg/dL    Comment: Glucose reference range applies only to samples taken after fasting for at least 8 hours.  Glucose, capillary     Status: Abnormal   Collection Time: 11/23/20 11:32 AM  Result Value Ref Range   Glucose-Capillary 151 (H) 70 - 99 mg/dL    Comment: Glucose reference range applies only to samples taken after fasting for at least 8 hours.   Comment 1 Notify RN    Comment 2 Document in Chart   Lipase, blood     Status: None   Collection Time: 11/30/20 11:13 PM  Result Value Ref Range   Lipase 33 11 - 51 U/L    Comment: Performed at [facility] [address] [place], [place] [address]  Comprehensive metabolic panel     Status: Abnormal   Collection Time: 11/30/20 11:13 PM  Result Value Ref Range   Sodium 133 (L) 135 - 145 mmol/L   Potassium 3.8 3.5 - 5.1 mmol/L   Chloride 96 (L) 98 - 111 mmol/L   CO2 26 22 - 32 mmol/L   Glucose, Bld 106 (H) 70 - 99 mg/dL    Comment: Glucose reference range applies only to samples taken after fasting for at least 8 hours.   BUN 15 8 - 23 mg/dL   Creatinine, Ser 1.18 (H) 0.44 - 1.00 mg/dL   Calcium 9.6  8.9 - 10.3 mg/dL   Total Protein 6.5 6.5 - 8.1 g/dL   Albumin 3.4 (L) 3.5 - 5.0 g/dL   AST 22 15 - 41 U/L   ALT 11 0 - 44 U/L   Alkaline Phosphatase 50 38 - 126 U/L   Total Bilirubin 1.3 (H) 0.3 - 1.2 mg/dL   GFR, Estimated 50 (L) >60 mL/min    Comment: (NOTE) Calculated using the CKD-EPI Creatinine Equation (2021)    Anion gap 11 5 - 15    Comment: Performed at [facility] [address], [place], [place] [address]  CBC     Status: None   Collection Time: 11/30/20 11:13 PM  Result Value Ref Range   WBC 7.2 4.0 - 10.5 K/uL   RBC 4.94 3.87 - 5.11 MIL/uL   Hemoglobin 15.0 12.0 - 15.0 g/dL   HCT 44.7 36.0 - 46.0 %   MCV 90.5 80.0 - 100.0 fL   MCH 30.4 26.0 - 34.0 pg   MCHC 33.6 30.0 - 36.0 g/dL   RDW 14.1 11.5 - 15.5 %   Platelets 349 150 - 400 K/uL   nRBC 0.0 0.0 - 0.2 %    Comment: Performed at [facility], [place] [address], [place], [place] [address]  CBC with Differential     Status: Abnormal   Collection Time: 12/01/20 11:04 AM  Result Value Ref Range   WBC 9.3 4.0 - 10.5 K/uL   RBC 5.21 (H) 3.87 - 5.11 MIL/uL   Hemoglobin 15.7 (H) 12.0 - 15.0 g/dL   HCT 46.9 (H) 36.0 - 46.0 %   MCV 90.0 80.0 - 100.0 fL   MCH 30.1 26.0 - 34.0 pg   MCHC 33.5 30.0 - 36.0 g/dL   RDW 14.0 11.5 - 15.5 %   Platelets 379 150 - 400 K/uL   nRBC 0.0 0.0 - 0.2 %   Neutrophils Relative % 66 %   Neutro Abs 6.2 1.7 - 7.7 K/uL   Lymphocytes Relative 20 %   Lymphs Abs 1.9 0.7 - 4.0 K/uL   Monocytes  Relative 10 %   Monocytes Absolute 1.0 0.1 - 1.0 K/uL   Eosinophils Relative 2 %   Eosinophils Absolute 0.2 0.0 - 0.5 K/uL   Basophils Relative 1 %   Basophils Absolute 0.1 0.0 - 0.1 K/uL   Immature Granulocytes 1 %   Abs Immature Granulocytes 0.07 0.00 - 0.07 K/uL    Comment: Performed at [facility], [address] [place], [place] [address]  Comprehensive metabolic panel     Status: Abnormal   Collection Time: 12/01/20 11:04 AM  Result Value Ref Range   Sodium 135 135 -  145 mmol/L   Potassium 4.3 3.5 - 5.1 mmol/L   Chloride 96 (L) 98 - 111 mmol/L   CO2 24 22 - 32 mmol/L   Glucose, Bld 127 (H) 70 - 99 mg/dL    Comment: Glucose reference range applies only to samples taken after fasting for at least 8 hours.   BUN 24 (H) 8 - 23 mg/dL   Creatinine, Ser 1.79 (H) 0.44 - 1.00 mg/dL   Calcium 10.0 8.9 - 10.3 mg/dL   Total Protein 8.2 (H) 6.5 - 8.1 g/dL   Albumin 4.1 3.5 - 5.0 g/dL   AST 26 15 - 41 U/L   ALT 14 0 - 44 U/L   Alkaline Phosphatase 62 38 - 126 U/L   Total Bilirubin 1.4 (H) 0.3 - 1.2 mg/dL   GFR, Estimated 30 (L) >60 mL/min    Comment: (NOTE) Calculated using the CKD-EPI Creatinine Equation (2021)    Anion gap 15 5 - 15    Comment: Performed at [facility], [address] [place], [place] [address]  Lipase, blood     Status: None   Collection Time: 12/01/20 11:04 AM  Result Value Ref Range   Lipase 35 11 - 51 U/L    Comment: Performed at [facility], [address], [place], [place] [address]  Basic metabolic panel     Status: Abnormal   Collection Time: 12/04/20  3:17 PM  Result Value Ref Range   Glucose 120 (H) 65 - 99 mg/dL   BUN 13 8 - 27 mg/dL   Creatinine, Ser 0.85 0.57 - 1.00 mg/dL   GFR calc non Af Amer 70 >59 mL/min/1.73   GFR calc Af Amer 80 >59 mL/min/1.73    Comment: **In accordance with recommendations from the NKF-ASN Task force,**   [facility] is in the process of updating its eGFR calculation to the   2021 CKD-EPI creatinine equation that estimates kidney function   without a race variable.    BUN/Creatinine Ratio 15 12 - 28   Sodium 137 134 - 144 mmol/L   Potassium 3.9 3.5 - 5.2 mmol/L   Chloride 98 96 - 106 mmol/L   CO2 20 20 - 29 mmol/L   Calcium 10.6 (H) 8.7 - 10.3 mg/dL  Theophylline level     Status: None   Collection Time: 12/04/20  3:17 PM  Result Value Ref Range   Theophylline, Serum 17.4 10.0 - 20.0 ug/mL    Comment:                                 Detection Limit =  0.8                            <0.8 indicates None Detected   Digoxin level  Status: None   Collection Time: 12/04/20  3:17 PM  Result Value Ref Range   Digoxin, Serum 0.6 0.5 - 0.9 ng/mL    Comment: Concentrations above 2.0 ng/mL are generally considered toxic. Some overlap of toxic and non-toxic values have been reported.                             Detection Limit = 0.4 ng/mL Therapeutic range is derived from 2013 ACCF/AHA Guidelines for the Management of Heart Failure.     Radiology: DG Chest 2 View  Result Date: 12/20/2020 CLINICAL DATA:  Short of breath, cough for 5 weeks EXAM: CHEST - 2 VIEW COMPARISON:  10/03/2020 FINDINGS: The heart size and mediastinal contours are within normal limits. Both lungs are clear. The visualized skeletal structures are unremarkable. IMPRESSION: No active cardiopulmonary disease. Electronically Signed   By: Randa Ngo M.D.   On: 12/20/2020 23:10    No results found.  DG Chest 2 View  Result Date: 12/20/2020 CLINICAL DATA:  Short of breath, cough for 5 weeks EXAM: CHEST - 2 VIEW COMPARISON:  10/03/2020 FINDINGS: The heart size and mediastinal contours are within normal limits. Both lungs are clear. The visualized skeletal structures are unremarkable. IMPRESSION: No active cardiopulmonary disease. Electronically Signed   By: Randa Ngo M.D.   On: 12/20/2020 23:10      Assessment and Plan: Patient Active Problem List   Diagnosis Date Noted  . Hyperlipidemia LDL goal <70 11/23/2020  . NICM (nonischemic cardiomyopathy) ([place])   . Acute R MCA ischemic stroke [facility]) s/p revascularization R M1, embolic d/t known AF 54/62/7035  . Middle cerebral artery embolism, right 11/20/2020  . Strain of extensor or abductor muscles, fascia and tendons of unspecified thumb at forearm level, initial encounter 06/12/2020  . Lymphedema 06/07/2020  . Acute right-sided low back pain with right-sided sciatica 05/21/2020  . Chronic systolic heart failure ([place])   . Pulmonary  hypertension, unspecified ([place])   . Nutritional deficiency 01/02/2020  . Memory deficit 01/02/2020  . Encounter for therapeutic drug level monitoring 01/02/2020  . B12 deficiency 01/02/2020  . PAD (peripheral artery disease) ([place]) 12/05/2019  . Intractable vomiting 10/09/2019  . Intestinal ischemia ([place])   . COPD with acute exacerbation ([place])   . Atrial fibrillation with rapid ventricular response ([place]) 09/11/2019  . Enteritis   . Peripheral edema 07/17/2019  . Atherosclerosis of both carotid arteries 07/17/2019  . Varicose veins of right lower extremity with inflammation 06/08/2019  . Gastroesophageal reflux disease with esophagitis 05/08/2019  . Fall at home, initial encounter 03/16/2019  . Contusion of right lower leg 03/16/2019  . Flu-like symptoms 02/03/2019  . Nausea 02/03/2019  . Suprapatellar bursitis of right knee 01/23/2019  . Pain in right leg 01/23/2019  . Cellulitis of right leg 01/23/2019  . Chronic venous stasis dermatitis of right lower extremity 01/23/2019  . Oropharyngeal candidiasis 01/23/2019  . Cellulitis of head except face 12/23/2018  . Screening for breast cancer 08/15/2018  . Chronic bronchitis with acute exacerbation ([place]) 08/15/2018  . Vitamin D deficiency 08/15/2018  . Need for vaccination against Streptococcus pneumoniae using pneumococcal conjugate vaccine 13 08/15/2018  . Obstructive chronic bronchitis without exacerbation ([place]) 07/14/2018  . Morbid obesity ([place]) 03/17/2018  . Asthma 02/25/2018  . Generalized anxiety disorder 12/16/2017  . Essential hypertension 12/16/2017  . Chronic respiratory failure with hypoxia ([place]) 12/16/2017  . Long term (current) use of anticoagulants 12/16/2017  . Chronic atrial  fibrillation ([place]) 12/16/2017  . Other specified functional intestinal disorders 12/16/2017  . Gastro-esophageal reflux disease without esophagitis 12/16/2017  . OSA on CPAP 12/16/2017  . Allergic rhinitis due to animal (cat) (dog) hair and  dander 12/16/2017  . Allergic rhinitis due to pollen 12/16/2017  . Severe persistent asthma without complication 60/63/0160  . Cough 12/16/2017  . Shortness of breath 12/16/2017  . Superficial thrombophlebitis of right leg 11/17/2016  . Pain in limb 11/17/2016  . Chronic venous insufficiency 11/17/2016  . Swelling of limb 11/17/2016  . Cellulitis of buttock   . Cellulitis of right axilla   . Sepsis ([place]) 06/28/2016  . Cellulitis and abscess 06/28/2016  . Hypokalemia 06/28/2016  . Acute renal insufficiency 06/28/2016  . Hyperglycemia 06/28/2016  . Leukocytosis 06/28/2016  . Hypoxia 06/28/2016  . Collagenous colitis 05/13/2016    1. OSA on CPAP will be continued 2. Acute stroke she needs to continue with PT OT 3. Oropharyngeal dysphagia she also has issues with smell of food. She states that she is not eating because of this 4. Obesity she has actually lost weight 5. Smell disorder will refer to ENT for assessment  General Counseling: I have discussed the findings of the evaluation and examination with [name].  I have also discussed any further diagnostic evaluation thatmay be needed or ordered today. Sussan verbalizes understanding of the findings of todays visit. We also reviewed her medications today and discussed drug interactions and side effects including but not limited excessive drowsiness and altered mental states. We also discussed that there is always a risk not just to her but also people around her. she has been encouraged to call the office with any questions or concerns that should arise related to todays visit.  Orders Placed This Encounter  Procedures  . Ambulatory referral to ENT    Referral Priority:   Routine    Referral Type:   Consultation    Referral Reason:   Specialty Services Required    Requested Specialty:   Otolaryngology    Number of Visits Requested:   1  . SLP modified barium swallow    Standing Status:   Future    Standing Expiration Date:    01/03/2022    Order Specific Question:   Where should this test be performed:    Answer:   Other    Comments:   [facility]    Order Specific Question:   Please indicate reason for Referral:    Answer:   Concerned about Dysphagia/Aspiration     Time spent: 41mn  I have personally obtained a history, examined the patient, evaluated laboratory and imaging results, formulated the assessment and plan and placed orders.    SAllyne Gee MD FO[facility]

## 2021-01-03 NOTE — Addendum Note (Signed)
Addended by: Devona Konig on: 01/03/2021 11:16 AM   Modules accepted: Orders

## 2021-01-03 NOTE — Patient Instructions (Signed)
Bradley's Neurology in Clinical Practice (8th ed., pp. 152-163). [place], [place]: Elsevier."> [place] Textbook of Surgery (21st ed., pp. 1056-1078). [place], [place]: Else[employer], Inc.">  Dysphagia  Dysphagia is trouble swallowing. This condition occurs when solids and liquids stick in a person's throat on the way down to the stomach, or when food takes longer to get to the stomach than usual. You may have problems swallowing food, liquids, or both. You may also have pain while trying to swallow. It may take you more time and effort to swallow something. What are the causes? This condition may be caused by:  Muscle problems. These may make it difficult for you to move food and liquids through the esophagus, which is the tube that connects your mouth to your stomach.  Blockages. You may have ulcers, scar tissue, or inflammation that blocks the normal passage of food and liquids. Causes of these problems include: ? Acid reflux from your stomach into your esophagus (gastroesophageal reflux). ? Infections. ? Radiation treatment for cancer. ? Medicines taken without enough fluids to wash them down into your stomach.  Stroke. This can affect the nerves and make it difficult to swallow.  Nerve problems. These prevent signals from being sent to the muscles of your esophagus to squeeze (contract) and move what you swallow down to your stomach.  Globus pharyngeus. This is a common problem that involves a feeling like something is stuck in your throat or a sense of trouble with swallowing, even though nothing is wrong with the swallowing passages.  Certain conditions, such as cerebral palsy or Parkinson's disease. What are the signs or symptoms? Common symptoms of this condition include:  A feeling that solids or liquids are stuck in your throat on the way down to the stomach.  Pain while swallowing.  Coughing or gagging while trying to swallow. Other symptoms include:  Food moving back from  your stomach to your mouth (regurgitation).  Noises coming from your throat.  Chest discomfort when swallowing.  A feeling of fullness when swallowing.  Drooling, especially when the throat is blocked.  Heartburn. How is this diagnosed? This condition may be diagnosed by:  Barium swallow X-ray. In this test, you will swallow a white liquid that sticks to the inside of your esophagus. X-ray images are then taken.  Endoscopy. In this test, a flexible telescope is inserted down your throat to look at your esophagus and your stomach.  CT scans or an MRI. How is this treated? Treatment for dysphagia depends on the cause of this condition:  If the dysphagia is caused by acid reflux or infection, medicines may be used. These may include antibiotics or heartburn medicines.  If the dysphagia is caused by problems with the muscles, swallowing therapy may be used to help you strengthen your swallowing muscles. You may have to do specific exercises to strengthen the muscles or stretch them.  If the dysphagia is caused by a blockage or mass, procedures to remove the blockage may be done. You may need surgery and a feeding tube. You may need to make diet changes. Ask your health care provider for specific instructions. Follow these instructions at home: Medicines  Take over-the-counter and prescription medicines only as told by your health care provider.  If you were prescribed an antibiotic medicine, take it as told by your health care provider. Do not stop taking the antibiotic even if you start to feel better. Eating and drinking  Make any diet changes as told by your health care provider.    Work with a diet and nutrition specialist (dietitian) to create an eating plan that will help you get the nutrients you need in order to stay healthy.  Eat soft foods that are easier to swallow.  Cut your food into small pieces and eat slowly. Take small bites.  Eat and drink only when you are  sitting upright.  Do not drink alcohol or caffeine. If you need help quitting, ask your health care provider.   General instructions  Check your weight every day to make sure you are not losing weight.  Do not use any products that contain nicotine or tobacco. These products include cigarettes, chewing tobacco, and vaping devices, such as e-cigarettes. If you need help quitting, ask your health care provider.  Keep all follow-up visits. This is important. Contact a health care provider if:  You lose weight because you cannot swallow.  You cough when you drink liquids.  You cough up partially digested food. Get help right away if:  You cannot swallow your saliva.  You have shortness of breath, a fever, or both.  Your voice is hoarse and you have trouble swallowing. These symptoms may represent a serious problem that is an emergency. Do not wait to see if the symptoms will go away. Get medical help right away. Call your local emergency services (911 in the [place] Do not drive yourself to the hospital. Summary  Dysphagia is trouble swallowing. This condition occurs when solids and liquids stick in a person's throat on the way down to the stomach. You may cough or gag while trying to swallow.  Dysphagia has many possible causes.  Treatment for dysphagia depends on the cause of the condition.  Keep all follow-up visits. This is important. This information is not intended to replace advice given to you by your health care provider. Make sure you discuss any questions you have with your health care provider. Document Revised: 07/28/2020 Document Reviewed: 07/28/2020 Elsevier Patient Education  2021 Elsevier Inc.  

## 2021-01-04 ENCOUNTER — Telehealth: Payer: Self-pay

## 2021-01-04 DIAGNOSIS — I69392 Facial weakness following cerebral infarction: Secondary | ICD-10-CM | POA: Diagnosis not present

## 2021-01-04 DIAGNOSIS — I69391 Dysphagia following cerebral infarction: Secondary | ICD-10-CM | POA: Diagnosis not present

## 2021-01-04 DIAGNOSIS — I69354 Hemiplegia and hemiparesis following cerebral infarction affecting left non-dominant side: Secondary | ICD-10-CM | POA: Diagnosis not present

## 2021-01-04 DIAGNOSIS — I69312 Visuospatial deficit and spatial neglect following cerebral infarction: Secondary | ICD-10-CM | POA: Diagnosis not present

## 2021-01-04 DIAGNOSIS — I69328 Other speech and language deficits following cerebral infarction: Secondary | ICD-10-CM | POA: Diagnosis not present

## 2021-01-04 DIAGNOSIS — R1312 Dysphagia, oropharyngeal phase: Secondary | ICD-10-CM | POA: Diagnosis not present

## 2021-01-04 NOTE — Telephone Encounter (Signed)
[facility] eval signed by provider and placed in [facility] folder.

## 2021-01-08 ENCOUNTER — Telehealth: Payer: Self-pay

## 2021-01-08 DIAGNOSIS — I69354 Hemiplegia and hemiparesis following cerebral infarction affecting left non-dominant side: Secondary | ICD-10-CM | POA: Diagnosis not present

## 2021-01-08 DIAGNOSIS — I69391 Dysphagia following cerebral infarction: Secondary | ICD-10-CM | POA: Diagnosis not present

## 2021-01-08 DIAGNOSIS — I69312 Visuospatial deficit and spatial neglect following cerebral infarction: Secondary | ICD-10-CM | POA: Diagnosis not present

## 2021-01-08 DIAGNOSIS — I69328 Other speech and language deficits following cerebral infarction: Secondary | ICD-10-CM | POA: Diagnosis not present

## 2021-01-08 DIAGNOSIS — R1312 Dysphagia, oropharyngeal phase: Secondary | ICD-10-CM | POA: Diagnosis not present

## 2021-01-08 DIAGNOSIS — I69392 Facial weakness following cerebral infarction: Secondary | ICD-10-CM | POA: Diagnosis not present

## 2021-01-08 NOTE — Telephone Encounter (Signed)
Patient states she is unable to eat any food and keep it down. This started after the stroke 11/20/21. Unable to have a bowel movement (constipation). She also states she has lost about 30 pounds. Please advise?

## 2021-01-08 NOTE — Telephone Encounter (Signed)
Patient's message was forwarded to Dr. Vicente Males and he advised she would need an office visit. LMV for patient to call the office to get an appointment scheduled.

## 2021-01-08 NOTE — Telephone Encounter (Signed)
Please call patient she has has some question. She had a stroke and lost 30 pounds

## 2021-01-08 NOTE — Telephone Encounter (Signed)
She has not been seen in 7 months- would need an office visit to evaluate her symptoms.

## 2021-01-09 ENCOUNTER — Ambulatory Visit (INDEPENDENT_AMBULATORY_CARE_PROVIDER_SITE_OTHER): Payer: Medicare Other | Admitting: Gastroenterology

## 2021-01-09 ENCOUNTER — Other Ambulatory Visit: Payer: Self-pay

## 2021-01-09 ENCOUNTER — Encounter: Payer: Self-pay | Admitting: Gastroenterology

## 2021-01-09 VITALS — BP 158/65 | HR 64 | Ht 64.0 in | Wt 238.6 lb

## 2021-01-09 DIAGNOSIS — M479 Spondylosis, unspecified: Secondary | ICD-10-CM | POA: Diagnosis not present

## 2021-01-09 DIAGNOSIS — K5900 Constipation, unspecified: Secondary | ICD-10-CM | POA: Diagnosis not present

## 2021-01-09 DIAGNOSIS — I639 Cerebral infarction, unspecified: Secondary | ICD-10-CM | POA: Diagnosis not present

## 2021-01-09 NOTE — Progress Notes (Signed)
Laura Bellows MD, MRCP(U.K) [address]  [place]  [place], [place] [address]  Main: [phone]  Fax: [phone]   Primary Care Physician: [name], MD  Primary Gastroenterologist:  Dr. Jonathon [name]    Unintentional weight loss  HPI: Laura [name] is a 70 y.o. female   Summary of history :  She was initially referred and seen on 01/25/2020 for diarrhea.She has previously been a patient of [facility] gastroenterology.She carries a diagnosis of collagenous colitis noted in 2017. Looking back at colon biopsy results from 2013 the biopsy resultsare notvery specific for collagenous colitis.She was hospitalized in November 2020 and underwent resection of the small bowel due to ischemia. During the surgery she underwent ileum and right colon resection. Had a second look surgery.Shesays that prior to the surgery she was having features of constipation alternating with diarrhea. Since the surgery she has been having only diarrhea up to 10-15 times a day. At times she has accidents and she has to wear a diaper to prevent soiling her undergarments. She denies any use of any artificial sugars, NSAIDs, Metformin. She wishes to transfer care to our care she was on a PPI which has been stopped. Failed treatment with Questran, imodium and CREON  09/18/2019: CT scan of the abdomen shows status post distal small bowel resection with right hemicolectomy and enterocolonic anastomosis.  01/02/2020: Ferritin 25. B12 and folate normal. Hemoglobin 10.7 g in January 2021 with an MCV of 85.  01/27/2020: Fecal calprotectin normal, celiac serology negative. Stool for C. difficile toxin was negative. GI PCR of her stool showed enteroaggregative E. coli positive. Commenced on azithromycin.  03/04/2020: Stool PCR- negative  03/19/2020: Biopsies taken of the terminal ileum as well as colon. No evidence of any form of colitis or inflammation.  Interval  history7/05/2020-12/30/2020  06/26/2020 weight 277 pounds and presently weighs 239 pounds  In November 2021 had a acute right MCA ischemic stroke likely embolic due to atrial fibrillation.  Occurred when she went off her anticoagulation for lumbar injection After her last visit commenced her on Xifaxan for 14 days   she states that since the stroke she has lost her sensation of smell and her taste sensation has been altered.  She has been having some issues with regurgitation but she takes PPI daily which helps.  No issues with swallowing.  She also is complaining of constipation whereas she had diarrhea previously   Current Outpatient Medications  Medication Sig Dispense Refill  . Accu-Chek Softclix Lancets lancets Use as instructed to check blood sugars daily 2 hours after meal 100 each 12  . albuterol (VENTOLIN HFA) 108 (90 Base) MCG/ACT inhaler Inhale 2 puffs into the lungs every 4 (four) hours as needed for wheezing or shortness of breath. 18 g 3  . ALPRAZolam (XANAX) 0.25 MG tablet Take 0.25 mg by mouth 2 (two) times daily as needed for anxiety.    Marland Kitchen apixaban (ELIQUIS) 5 MG TABS tablet Take 1 tablet (5 mg total) by mouth 2 (two) times daily. 60 tablet 6  . azithromycin (ZITHROMAX) 250 MG tablet Take one tablet by mouth once daily for 10 days 10 each 0  . benzonatate (TESSALON) 100 MG capsule Take 1 capsule (100 mg total) by mouth 2 (two) times daily as needed for cough. 20 capsule 2  . budesonide (PULMICORT) 0.25 MG/2ML nebulizer solution Take 2 mLs (0.25 mg total) by nebulization 2 (two) times daily. 60 mL 12  . digoxin (LANOXIN) 0.125 MG tablet Take 1  tablet (0.125 mg total) by mouth daily. 90 tablet 1  . diltiazem (CARDIZEM CD) 360 MG 24 hr capsule Take 1 capsule (360 mg total) by mouth daily. 90 capsule 2  . EPINEPHrine 0.3 mg/0.3 mL IJ SOAJ injection Inject 0.3 mg into the muscle as needed for anaphylaxis.     Marland Kitchen gabapentin (NEURONTIN) 100 MG capsule Take 1 capsule (100 mg total) by  mouth 3 (three) times daily. 90 capsule 3  . glucose blood (ACCU-CHEK GUIDE) test strip Use as instructed to check blood sugars daily 2 hours after meal 100 each 12  . ipratropium-albuterol (DUONEB) 0.5-2.5 (3) MG/3ML SOLN Take 3 mLs by nebulization every 4 (four) hours as needed. 120 mL 3  . loperamide (IMODIUM) 2 MG capsule Take 4 mg by mouth as needed for diarrhea or loose stools.    Marland Kitchen losartan (COZAAR) 25 MG tablet Take 1 tablet (25 mg total) by mouth daily. 90 tablet 1  . Melatonin 10 MG TABS Take 10 mg by mouth at bedtime as needed.    . montelukast (SINGULAIR) 10 MG tablet Take 1 tablet (10 mg total) by mouth daily. 90 tablet 1  . ondansetron (ZOFRAN) 4 MG tablet Take 1 tablet (4 mg total) by mouth every 8 (eight) hours as needed for nausea or vomiting. 60 tablet 1  . predniSONE (DELTASONE) 10 MG tablet Take 1 tablet (10 mg total) by mouth daily with breakfast. 30 tablet 0  . Probiotic Product (PROBIOTIC-10) CAPS Take 1 capsule by mouth daily.     . promethazine (PHENERGAN) 12.5 MG tablet Take 1 tablet (12.5 mg total) by mouth every 12 (twelve) hours as needed for nausea or vomiting. 30 tablet 0  . rosuvastatin (CRESTOR) 20 MG tablet Take 1 tablet (20 mg total) by mouth daily. 90 tablet 3  . theophylline (THEO-24) 100 MG 24 hr capsule Take 1 capsule (100 mg total) by mouth daily. 30 capsule 3  . traMADol (ULTRAM) 50 MG tablet Take 50 mg by mouth every 6 (six) hours as needed for moderate pain.      No current facility-administered medications for this visit.    Allergies as of 01/09/2021 - Review Complete 01/09/2021  Allergen Reaction Noted  . Flecainide Shortness Of Breath 11/27/2016  . Metoprolol Shortness Of Breath, Swelling, and Other (See Comments) 01/13/2020  . Propafenone Shortness Of Breath, Swelling, and Other (See Comments) 12/01/2016  . Rivaroxaban Swelling and Other (See Comments) 10/23/2016    ROS:  General: Negative for anorexia, weight loss, fever, chills, fatigue,  weakness. ENT: Negative for hoarseness, difficulty swallowing , nasal congestion. CV: Negative for chest pain, angina, palpitations, dyspnea on exertion, peripheral edema.  Respiratory: Negative for dyspnea at rest, dyspnea on exertion, cough, sputum, wheezing.  GI: See history of present illness. GU:  Negative for dysuria, hematuria, urinary incontinence, urinary frequency, nocturnal urination.  Endo: Negative for unusual weight change.    Physical Examination:   BP (!) 158/65 (BP Location: Right Arm, Patient Position: Sitting, Cuff Size: Large)   Pulse 64   Ht 5\' 4"  (1.626 m)   Wt 238 lb 9.6 oz (108.2 kg)   LMP  (LMP Unknown)   BMI 40.96 kg/m   General: Well-nourished, well-developed in no acute distress.  Eyes: No icterus. Conjunctivae pink. Skin: Warm and dry, no jaundice.   Psych: Alert and cooperative, normal mood and affect.   Imaging Studies: DG Chest 2 View  Result Date: 12/20/2020 CLINICAL DATA:  Short of breath, cough for 5 weeks EXAM: CHEST -  2 VIEW COMPARISON:  10/03/2020 FINDINGS: The heart size and mediastinal contours are within normal limits. Both lungs are clear. The visualized skeletal structures are unremarkable. IMPRESSION: No active cardiopulmonary disease. Electronically Signed   By: Randa Ngo M.D.   On: 12/20/2020 23:10    Assessment and Plan:   Laura [name] is a 70 y.o. y/o female  previously has been seen by me for IBS diarrhea.  She recently had stroke affecting her MCA territory and causing weakness on the left side of her body.  Since then she has had altered sensation of taste and smell which is affected her food intake.  She does not have any issues with swallowing.  She has a decreased desire for eating.  Previously had diarrhea but now has constipation.   Plan 1. Explained I do not have any particular medication for change of taste or smell.  I do not believe that acid reflux can cause this but she could continue to take her PPI which  she has been taking for acid reflux.  At times COVID can cause it.  She has been diagnosed with COVID a year back.  2.  Constipation commenced on MiraLAX 1-2 capfuls a day.  Take daily.  In addition take fiber supplements.  If no better in 2 weeks I will commence her on Linzess  Three.  Weight loss is likely due to recent hospitalization, stroke and decreased food intake due to altered sensation of taste and smell.  Dr Laura Bellows  MD,MRCP [facility]) Follow up in 2 weeks telephone visit

## 2021-01-10 ENCOUNTER — Other Ambulatory Visit: Payer: Self-pay | Admitting: Hospice and Palliative Medicine

## 2021-01-10 DIAGNOSIS — I69312 Visuospatial deficit and spatial neglect following cerebral infarction: Secondary | ICD-10-CM | POA: Diagnosis not present

## 2021-01-10 DIAGNOSIS — I69328 Other speech and language deficits following cerebral infarction: Secondary | ICD-10-CM | POA: Diagnosis not present

## 2021-01-10 DIAGNOSIS — I69354 Hemiplegia and hemiparesis following cerebral infarction affecting left non-dominant side: Secondary | ICD-10-CM | POA: Diagnosis not present

## 2021-01-10 DIAGNOSIS — I69391 Dysphagia following cerebral infarction: Secondary | ICD-10-CM | POA: Diagnosis not present

## 2021-01-10 DIAGNOSIS — I69392 Facial weakness following cerebral infarction: Secondary | ICD-10-CM | POA: Diagnosis not present

## 2021-01-10 DIAGNOSIS — R1312 Dysphagia, oropharyngeal phase: Secondary | ICD-10-CM | POA: Diagnosis not present

## 2021-01-11 ENCOUNTER — Other Ambulatory Visit: Payer: Self-pay | Admitting: Hospice and Palliative Medicine

## 2021-01-14 ENCOUNTER — Other Ambulatory Visit: Payer: Self-pay

## 2021-01-14 ENCOUNTER — Other Ambulatory Visit: Payer: Self-pay | Admitting: *Deleted

## 2021-01-14 ENCOUNTER — Encounter: Payer: Self-pay | Admitting: *Deleted

## 2021-01-14 NOTE — Patient Outreach (Signed)
[name]) Care Management  01/14/2021  Laura [name] May 21, 1951 [id number]  [facility] outreach to complex care patient referred by [facility] Laura Laura [name] was referred to [facility] on 12/04/20 for EMMI stroke referral issues related questions/problems with meds, lost interest in things they used to enjoy and symptoms of sad, hopeless, anxious, or empty was reported. After several unsuccessful outreaches, she returned a call on 12/12/20 to initiate EMMI Stroke services On 12/24/20 she reported further medication cost issues and was referred to [place] admission 11/20/20-11/23/20 Acute R MCA ischemic stroke [facility]) s/p revascularization R M1, embolic d/t known AF 0/17/51-0/25/85 enteritis and Atrial fibrillation with rapid ventricular response Insurance [employer] Medicare and [employer]na   Successful outreach to home number [phone]  Patient is able to verify HIPAA ([place]) identifiers Reviewed and addressed the purpose of the follow up call with the patient  Consent: [facility] RN CM reviewed [facility] services with patient. Patient gave verbal consent for services.  Follow up assessment Medication costs/concerns Laura [name] reports her Eliquis cost concern is resolved She clarifies she was being charged a 200+ charge in December 2021 for Eliquis but as of the recent January 2022 refill the charge was $40 She reports the cost of her medications have decreased for January 2022   theophylline cost was $47  The costs have gone down  She reports she get about $1400/month and then spent $400 =$1000 left  She retired in December 2021  her daughter and/or grand daughter [name] fills her pill containers  Stroke assessment She reports improvements with  Activities of daily living (ADLs) since the last 12/24/20 outreach as she continue to work with her home health therapists She reports being w/c bound but now walks without  her walker at intervals She keeps the walker to use if she gets fatigue. She reports primarily independence with dressing, bathing, cooking, getting out of bed, showering, dressing  Her family continues to assist with  IADLs (instrumental Activities of Daily Living) She reports her grand daughter, [name] and her family moved in with her and her husband, Laura [name] to care for them. Her grand daughter does the errands to include grocery shopping, assistance with transportation. [name], daughter assists with filling her medicine containers  Weight loss/nutrition Loss of 30+ lbs since last admission is reported related to "food tastes rotten" like meat & eggs. She report she and the household members had "mild" covid in September 2021. On today she reports not preferring to have the covid vaccine but with a history of taking the flu vaccine.  Flu shot last taken 09/14/19 and post pone in 2021 related to admission She has spoke to neurology NP, Janett Billow about this loss of taste. The pt state she thinks it may be related to infection of a gland in her nose,. She is to see an ENT on 01/15/21 and has been taking in premier protein drinks Pt reports a history of a 330 lb weight that was decreased to 260 lbs and further decreased to 238 lbs after the stroke. She reports prior to the stroke she was working with her primary care provider (PCP) and nutritionist on decreasing weight with and increase in vegetables but unable to do this at this time related to taste issues. She reports she would like to get to a 180-200 lbs or size 9. She reports wanting to lose at least 40 more pounds. She has a past medical history (PMH)  of a bowel resection per pt 09/19/19   Hypertension (HTN)  blood pressure (BP) and pulse oximetry are monitored at the home Eyes check before this admission, cataract removal in July 2021 before stomach surgery will have check eye MD keith [place] near [place] did cataract sur feet therapies check  OT now can wash and cook PT only 2 days a week  Went to church wellness visit in 2/22 cardiology in 2/22  Right side [place] on her throat above collar bone see ENT on 01/15/21 not painful her daughter has thyroid issues Husband does laundry brittany groceries Her kids keep her s [place] assistance at MD office cough wheezing   Patient Active Problem List   Diagnosis Date Noted  . Hyperlipidemia LDL goal <70 11/23/2020  . NICM (nonischemic cardiomyopathy) ([place])   . Acute R MCA ischemic stroke [name]) s/p revascularization R M1, embolic d/t known AF 25/42/7062  . Middle cerebral artery embolism, right 11/20/2020  . Strain of extensor or abductor muscles, fascia and tendons of unspecified thumb at forearm level, initial encounter 06/12/2020  . Lymphedema 06/07/2020  . Acute right-sided low back pain with right-sided sciatica 05/21/2020  . Chronic systolic heart failure ([place])   . Pulmonary hypertension, unspecified ([place])   . Nutritional deficiency 01/02/2020  . Memory deficit 01/02/2020  . Encounter for therapeutic drug level monitoring 01/02/2020  . B12 deficiency 01/02/2020  . PAD (peripheral artery disease) ([place]) 12/05/2019  . Intractable vomiting 10/09/2019  . Intestinal ischemia ([place])   . COPD with acute exacerbation ([place])   . Atrial fibrillation with rapid ventricular response ([place]) 09/11/2019  . Enteritis   . Peripheral edema 07/17/2019  . Atherosclerosis of both carotid arteries 07/17/2019  . Varicose veins of right lower extremity with inflammation 06/08/2019  . Gastroesophageal reflux disease with esophagitis 05/08/2019  . Fall at home, initial encounter 03/16/2019  . Contusion of right lower leg 03/16/2019  . Flu-like symptoms 02/03/2019  . Nausea 02/03/2019  . Suprapatellar bursitis of right knee 01/23/2019  . Pain in right leg 01/23/2019  . Cellulitis of right leg 01/23/2019  . Chronic venous stasis dermatitis of right lower extremity 01/23/2019  . Oropharyngeal candidiasis  01/23/2019  . Cellulitis of head except face 12/23/2018  . Screening for breast cancer 08/15/2018  . Chronic bronchitis with acute exacerbation ([place]) 08/15/2018  . Vitamin D deficiency 08/15/2018  . Need for vaccination against Streptococcus pneumoniae using pneumococcal conjugate vaccine 13 08/15/2018  . Obstructive chronic bronchitis without exacerbation ([place]) 07/14/2018  . Morbid obesity ([place]) 03/17/2018  . Asthma 02/25/2018  . Generalized anxiety disorder 12/16/2017  . Essential hypertension 12/16/2017  . Chronic respiratory failure with hypoxia ([place]) 12/16/2017  . Long term (current) use of anticoagulants 12/16/2017  . Chronic atrial fibrillation ([place]) 12/16/2017  . Other specified functional intestinal disorders 12/16/2017  . Gastro-esophageal reflux disease without esophagitis 12/16/2017  . OSA on CPAP 12/16/2017  . Allergic rhinitis due to animal (cat) (dog) hair and dander 12/16/2017  . Allergic rhinitis due to pollen 12/16/2017  . Severe persistent asthma without complication 37/62/8315  . Cough 12/16/2017  . Shortness of breath 12/16/2017  . Superficial thrombophlebitis of right leg 11/17/2016  . Pain in limb 11/17/2016  . Chronic venous insufficiency 11/17/2016  . Swelling of limb 11/17/2016  . Cellulitis of buttock   . Cellulitis of right axilla   . Sepsis ([place]) 06/28/2016  . Cellulitis and abscess 06/28/2016  . Hypokalemia 06/28/2016  . Acute renal insufficiency 06/28/2016  . Hyperglycemia  06/28/2016  . Leukocytosis 06/28/2016  . Hypoxia 06/28/2016  . Collagenous colitis 05/13/2016    Plans  Patient agrees to care plan and follow up within the next 30-45 business days Pt encouraged to return a call to [facility] RN CM prn Routed note to MD Goals Addressed              This Visit's Progress     Patient Stated   .  [facility] ) Keep or Improve My Strength-Stroke (pt-stated)   On track     Timeframe:  Long-Range Goal Priority:  High Start Date:                    12/12/20          Expected End Date:             03/21/20          Follow Up Date 02/15/21   - attend 90 percent of occupational therapy appointments - attend 90 percent of physical therapy appointments - increase activity or exercise time a little every week - plan activity for times when energy is the highest       Notes: 01/14/21 She reports improvements with  Activities of daily living (ADLs) since the last 12/24/20 outreach as she continue to work with her home health therapists She reports being w/c bound but now walks without her walker at intervals She keeps the walker to use if she gets fatigue. She reports primarily independence with dressing, bathing, cooking, getting out of bed, showering, dressing     .  ([facility]) Eat Healthy (pt-stated)   On track     Timeframe:  Short-Term Goal Priority:  Medium Start Date:             01/14/21                Expected End Date:         04/20/21              Follow Up Date 02/15/21   - set goal weight - set a realistic goal       Notes: 01/14/21 She reports she would like to get to a 180-200 lbs or size 9. She reports wanting to lose at least 40 more pounds. She is trying to take in premier protein shakes    .  [facility]) Manage My Medicine (pt-stated)   On track     Timeframe:  Short-Term Goal Priority:  High Start Date:                  12/24/20           Expected End Date:          02/18/21             Follow Up Date 02/15/21   - call for medicine refill 2 or 3 days before it runs out     Notes:          Mckenzi Buonomo L. Lavina Hamman, RN, BSN, [place] Coordinator Office number [phone] Main [facility] number [phone] Fax number [phone]

## 2021-01-15 ENCOUNTER — Telehealth: Payer: Self-pay | Admitting: Adult Health

## 2021-01-15 ENCOUNTER — Telehealth: Payer: Self-pay

## 2021-01-15 ENCOUNTER — Other Ambulatory Visit: Payer: Self-pay

## 2021-01-15 DIAGNOSIS — R438 Other disturbances of smell and taste: Secondary | ICD-10-CM | POA: Diagnosis not present

## 2021-01-15 DIAGNOSIS — R439 Unspecified disturbances of smell and taste: Secondary | ICD-10-CM | POA: Diagnosis not present

## 2021-01-15 IMAGING — XA IR PERCUTANEOUS ART THORMBECTOMY/INFUSION INTRACRANIAL INCLUDE D
2 series · 15 of 24 positions shown · non-contrast
Comparison: CT angiogram of the head and neck November 20, 2020.

INDICATION: New onset left-sided hemiplegia, right gaze deviation and slurred
speech. Occluded right middle cerebral artery on CT angiogram of the
head and neck.

EXAM:
1. EMERGENT LARGE VESSEL OCCLUSION THROMBOLYSIS (anterior
CIRCULATION)
TECHNIQUE: Following a full explanation of the procedure along with the
potential associated complications, an informed witnessed consent
was obtained from the patient's spouse. The risks of intracranial
hemorrhage of 10%, worsening neurological deficit, ventilator
dependency, death and inability to revascularize were all reviewed
in detail with the patient's spouse.

[Series 14: <mpr range>2 · axial · 5.0mm · 0.34mm/px · z∈[-254,-85]mm · 7 of 30 slices shown]
[im 1/30]
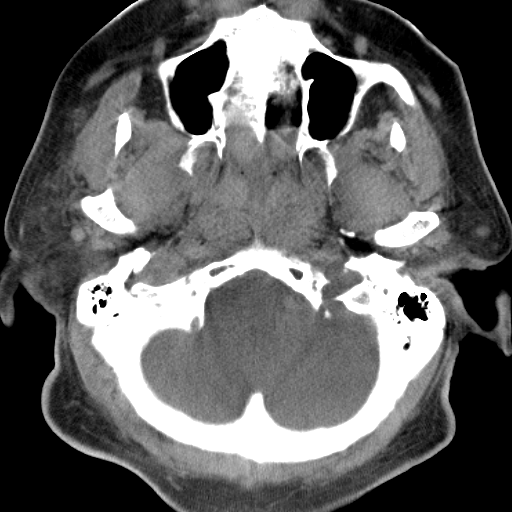
[im 6/30]
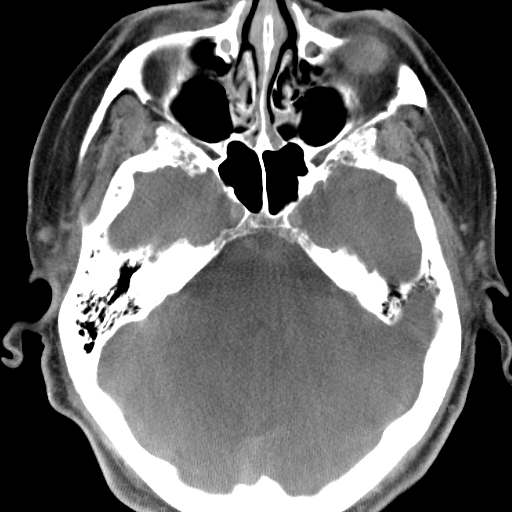
[im 12/30]
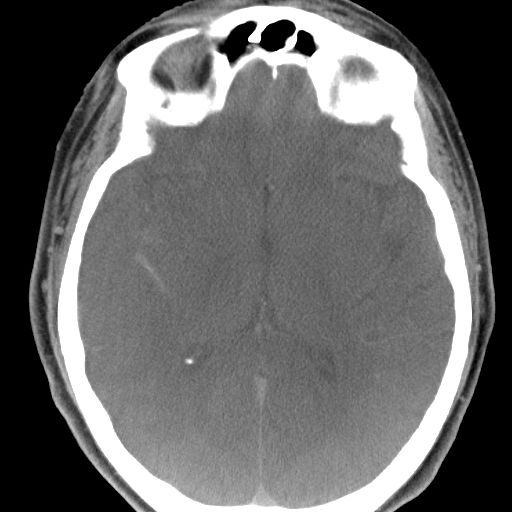
[im 15/30]
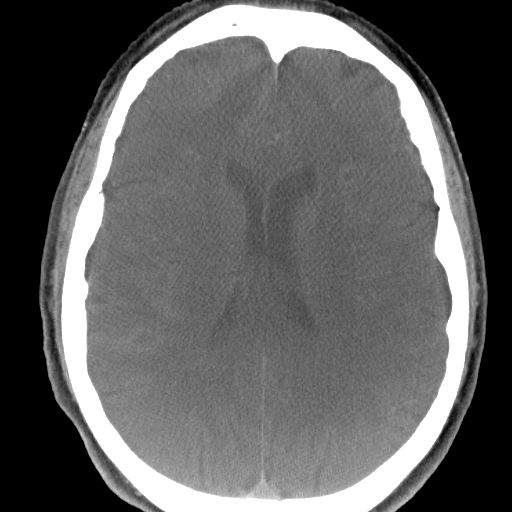
[im 21/30]
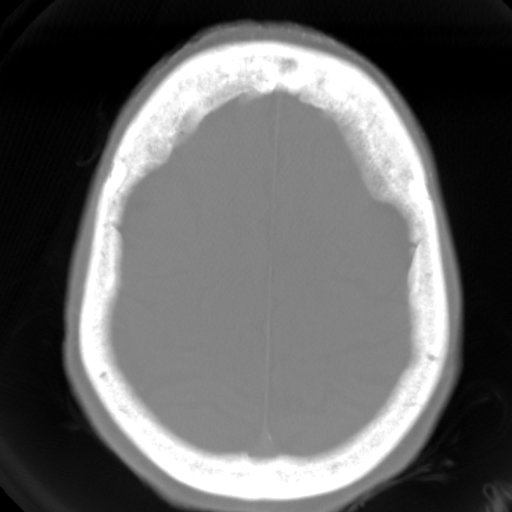
[im 24/30]
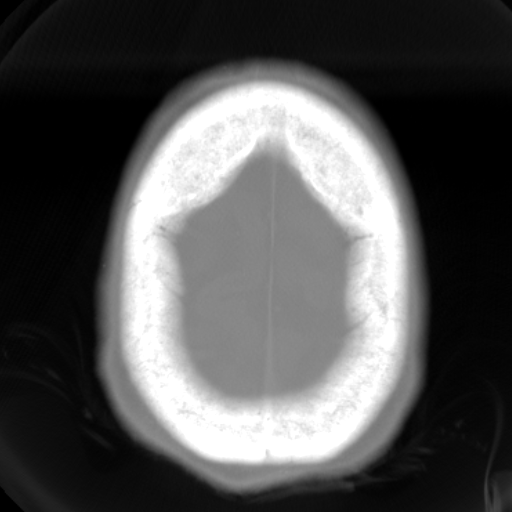
[im 30/30]
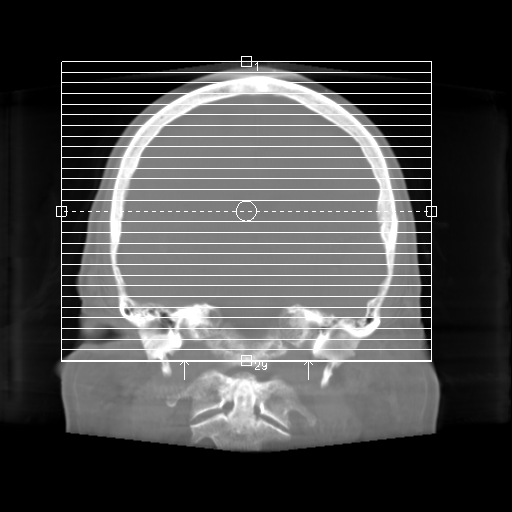

[Series 14: <mpr range>1 · axial · 5.0mm · 0.40mm/px · z∈[-230,-79]mm · 8 of 37 slices shown]
[im 4/37]
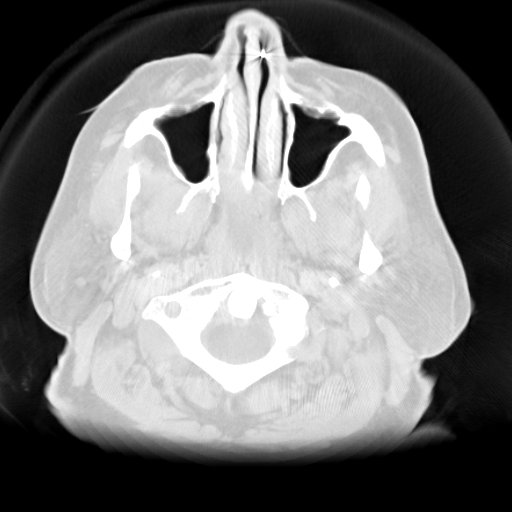
[im 7/37]
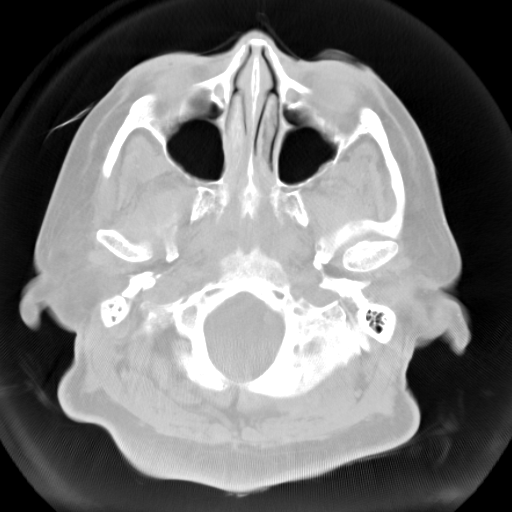
[im 13/37]
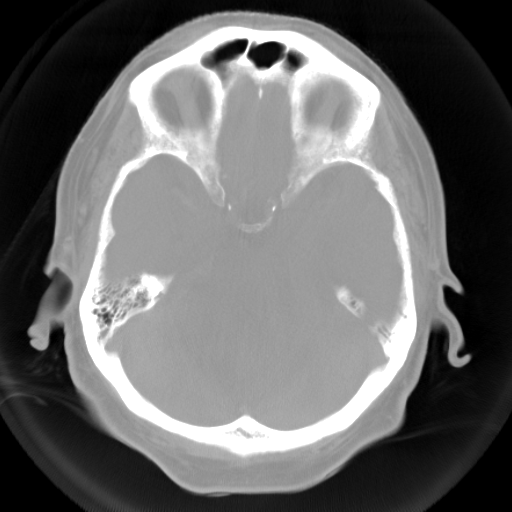
[im 16/37]
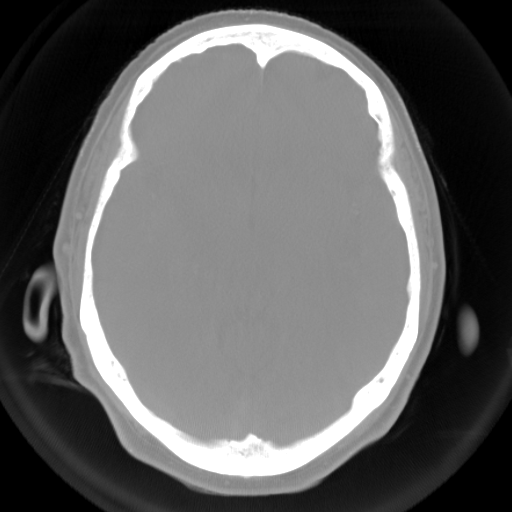
[im 22/37]
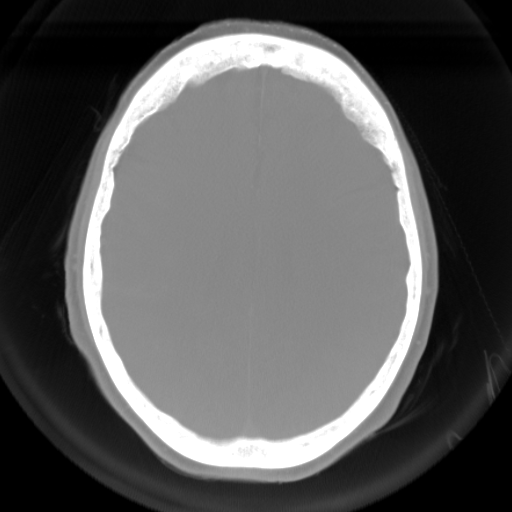
[im 28/37]
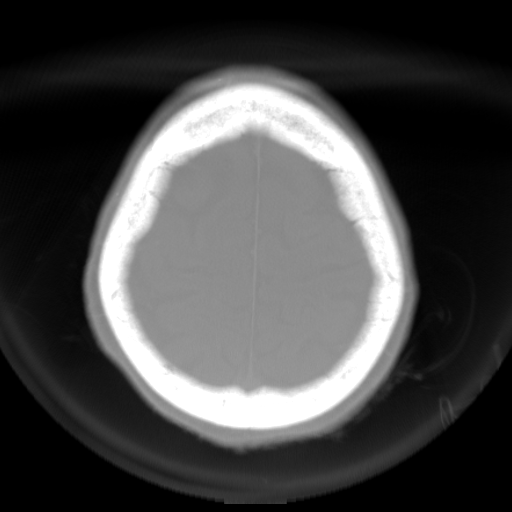
[im 31/37]
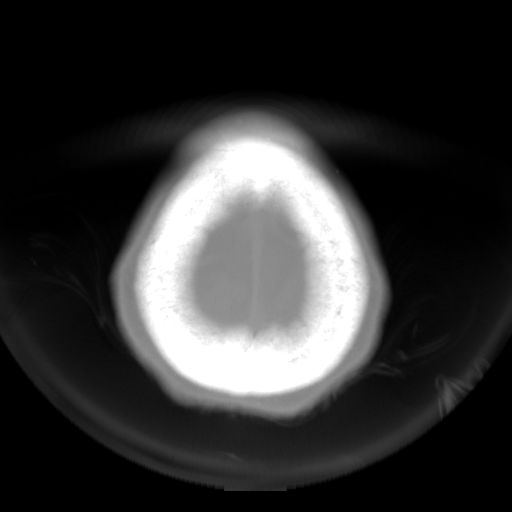
[im 37/37  full-range]
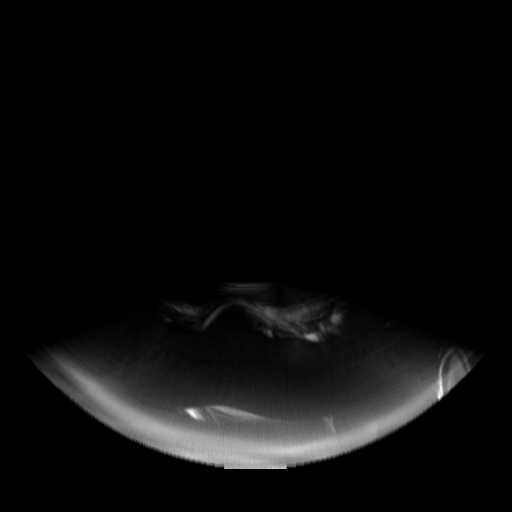

[15 of 24 positions shown; findings below may reference images not displayed]

MEDICATIONS:
Ancef 2 g antibiotic was administered within 1 hour of the
procedure.

ANESTHESIA/SEDATION:
General anesthesia

CONTRAST:  Omnipaque 300 approximately 100 mL

FLUOROSCOPY TIME:  Fluoroscopy Time: 22 minutes 18 seconds (3339
mGy).

COMPLICATIONS:
None immediate.
The patient was then put under general anesthesia by the [REDACTED] at [HOSPITAL].

The right groin was prepped and draped in the usual sterile fashion.
Thereafter using modified Seldinger technique, transfemoral access
into the right common femoral artery was obtained without
difficulty. Over a 0.035 inch guidewire an 8 French 25 cm Pinnacle
sheath was inserted. Through this, and also over a 0.035 inch
guidewire a 5 French JB 1 catheter was advanced to the aortic arch
region and selectively positioned in the innominate artery and the
right common carotid artery.
FINDINGS: The innominate arteriogram demonstrates the right subclavian artery
and the right common carotid artery proximally to be widely patent.

The right vertebral artery origin is widely patent. The vessel is
seen to opacify to the cranial skull base. Patency is maintained of
the right vertebrobasilar junction and the right posterior-inferior
cerebellar artery. The opacified portion of the basilar artery, the
right posterior cerebral artery, the right superior cerebellar
artery and the right anterior inferior cerebellar artery
demonstrates wide patency into the capillary and venous phases on
the lateral projection.

The right common carotid arteriogram demonstrates the right external
carotid artery and its major branches to be widely patent.

The right internal carotid artery at the bulb to the cranial skull
base is widely patent.

Patency is maintained of the petrous, cavernous and supraclinoid
segments. The right posterior communicating artery is seen
transiently opacifying the right posterior cerebral artery
distribution.

The right anterior cerebral artery opacifies into the capillary and
venous phases. Complete truncation of the right middle cerebral
artery just distal to its origin is seen.

PROCEDURE:
Over a 0.035 inch Rosen exchange guidewire, the 5 French diagnostic
catheter was exchanged for a 95 cm 087 balloon guide catheter which
had been prepped with 50% contrast and 50% heparinized saline
infusion and positioned at the origin of the right internal carotid
artery. The guidewire was removed. Good aspiration obtained from the
hub of the balloon guide catheter. A gentle control arteriogram
performed through this demonstrated no evidence of spasms,
dissections or of intraluminal filling defects. Over a 0.014 inch
standard Synchro micro guidewire with a J-tip configuration, a
combination of an 018 Trevo ProVue microcatheter inside of the 071
136 cm Zoom aspiration catheter was advanced to the supraclinoid
right ICA without any difficulty. The balloon guide was advanced
into the distal cervical right ICA.

Using a torque device, the micro guidewire was then advanced without
difficulty through the occluded right middle cerebral artery into
the inferior division M2 M3 region followed by the microcatheter.
The guidewire was removed. Good aspiration obtained from the hub of
the microcatheter. A gentle control arteriogram performed through
the microcatheter demonstrated safe position of the tip of the
microcatheter which was then connected to continuous heparinized
saline infusion. A 6.5 mm x 45 mm Embotrap retrieval device was then
advanced to the distal end of the microcatheter. The O ring on the
delivery microcatheter was then loosened. With slight forward gentle
traction with the right hand on the delivery micro guidewire with
the left hand the delivery microcatheter was retrieved deploying the
retrieval device the proximal end of which engaged the proximal
portion of the occluded right middle cerebral artery M1 segment.

Aspiration catheter was then advanced into the mid M1 region.
Thereafter, constant aspiration was applied at the hub of the
aspiration catheter, and with proximal flow arrest in the right
internal carotid artery with a 20 mL syringe at the hub of the
balloon guide catheter for approximately 2 minutes.

The retrieval device was slowly withdrawn into the aspiration
catheter and the combination was retrieved and removed. Following
reversal of proximal flow arrest, a control arteriogram performed
through the balloon guide in the right internal carotid artery
demonstrated complete revascularization of the right middle cerebral
artery distribution with patency of the right posterior
communicating artery and the right anterior cerebral arteries.

Mild spasm in the distal right internal carotid artery responded to
2 aliquots of 25 mcg of nitroglycerin intra-arterially.

Repeat arteriogram performed 5 minutes later continued to
demonstrate improved opacification to the internal carotid artery,
with maintenance of a TICI 3 revascularization of the right middle
cerebral artery distribution.

No evidence of mass effect or midline shift was seen. No change was
seen in the patient's blood pressure or heart rate.

The delayed capillary phase demonstrated a small focal area of hypo
perfusion in the medial mid temporal subcortical region.

The balloon guide was then retrieved and removed. The 8 French
Pinnacle sheath was replaced with an 8 French Angio-Seal closure
device for hemostasis. Distal pulses remained Dopplerable in the
lower extremities unchanged.

A CT of the brain demonstrated no mass or midline shift. There was a
small amount of contrast seen in the posterior perisylvian region
extending into the medial temporal region, probably representing
contrast +/-subarachnoid hemorrhage component.

Patient's general anesthesia was reversed and the patient was
extubated. Upon recovery, the patient was able to [place]io Fai simple
commands appropriately.

She was able to bend her left knee and bend her left elbow. She was
then returned to the neuro ICU for post revascularization
management.
IMPRESSION: Status post revascularization of occluded right middle cerebral
proximal M1 segment with 1 pass using the 6.5 mm x 45 mm Embotrap
retrieval device and proximal aspiration achieving a TICI 3
revascularization.

PLAN:
Follow-up p.r.n. in 4-6 weeks post discharge.

## 2021-01-15 MED ORDER — PREDNISONE 10 MG PO TABS: ORAL_TABLET | ORAL | 0 refills | Status: DC

## 2021-01-15 MED ORDER — AZITHROMYCIN 250 MG PO TABS: ORAL_TABLET | ORAL | 0 refills | Status: DC

## 2021-01-15 NOTE — Telephone Encounter (Signed)
Unable to personally review ENT notes via epic but no concern of MRI with contrast from a stroke standpoint.  Her last BMP shows appropriate kidney function for contrast.  She is also okay to be on prednisone if advised by pulmonology from a stroke standpoint.

## 2021-01-15 NOTE — Telephone Encounter (Signed)
Pt having issues with her smell and taste, pt has questions about a medication she is on

## 2021-01-15 NOTE — Telephone Encounter (Signed)
I spoke to pt and relayed the information to her by JM/NP recommendations.  She will proceed with the MRI with contrast, last bmp showed adequate kidney function, and ok to have prednisone taper (from stroke standpoint) by pulmonary if recommended by them.  Pt verbalized understanding.

## 2021-01-15 NOTE — Telephone Encounter (Signed)
I called patient.  She still has an altered sense of taste and smell.  She saw ENT today who recommended that she have an MRI with contrast.  She wants to ensure that she is okay neurologically to have contrasted MRIs.  Patient reports that she also saw a GI physician in checked out okay.  Patient also reports that she has COPD and has been coughing frequently.  She may have an infection.  Her pulmonologist generally gives her a prednisone taper but she wants to ensure that she is okay to take prednisone from a neurological standpoint.

## 2021-01-15 NOTE — Telephone Encounter (Signed)
Patient called and advised that she had spoke to [place] last week and then she had Wheezing, and cough with colored mucus,now patient called back and advised she is feeling worse and is coughing bringing up green mucus and having wheezing still.  I informed Dr Humphrey Rolls and she sent in azithromycin 250 mg 1 daily for 10 days, and prednisone dose 10 mg 1 tab 3 x day for 3 days, the take one tab 2 x a day for 3 days and then take one tab a day for 3 days for COPD.

## 2021-01-16 DIAGNOSIS — I69392 Facial weakness following cerebral infarction: Secondary | ICD-10-CM | POA: Diagnosis not present

## 2021-01-16 DIAGNOSIS — I69391 Dysphagia following cerebral infarction: Secondary | ICD-10-CM | POA: Diagnosis not present

## 2021-01-16 DIAGNOSIS — I69312 Visuospatial deficit and spatial neglect following cerebral infarction: Secondary | ICD-10-CM | POA: Diagnosis not present

## 2021-01-16 DIAGNOSIS — I69328 Other speech and language deficits following cerebral infarction: Secondary | ICD-10-CM | POA: Diagnosis not present

## 2021-01-16 DIAGNOSIS — R1312 Dysphagia, oropharyngeal phase: Secondary | ICD-10-CM | POA: Diagnosis not present

## 2021-01-16 DIAGNOSIS — I69354 Hemiplegia and hemiparesis following cerebral infarction affecting left non-dominant side: Secondary | ICD-10-CM | POA: Diagnosis not present

## 2021-01-17 ENCOUNTER — Other Ambulatory Visit: Payer: Self-pay | Admitting: Internal Medicine

## 2021-01-17 DIAGNOSIS — J4550 Severe persistent asthma, uncomplicated: Secondary | ICD-10-CM

## 2021-01-18 DIAGNOSIS — I69312 Visuospatial deficit and spatial neglect following cerebral infarction: Secondary | ICD-10-CM | POA: Diagnosis not present

## 2021-01-18 DIAGNOSIS — R1312 Dysphagia, oropharyngeal phase: Secondary | ICD-10-CM | POA: Diagnosis not present

## 2021-01-18 DIAGNOSIS — I69354 Hemiplegia and hemiparesis following cerebral infarction affecting left non-dominant side: Secondary | ICD-10-CM | POA: Diagnosis not present

## 2021-01-18 DIAGNOSIS — I69328 Other speech and language deficits following cerebral infarction: Secondary | ICD-10-CM | POA: Diagnosis not present

## 2021-01-18 DIAGNOSIS — I69391 Dysphagia following cerebral infarction: Secondary | ICD-10-CM | POA: Diagnosis not present

## 2021-01-18 DIAGNOSIS — I69392 Facial weakness following cerebral infarction: Secondary | ICD-10-CM | POA: Diagnosis not present

## 2021-01-21 DIAGNOSIS — M47817 Spondylosis without myelopathy or radiculopathy, lumbosacral region: Secondary | ICD-10-CM | POA: Diagnosis not present

## 2021-01-22 DIAGNOSIS — I69328 Other speech and language deficits following cerebral infarction: Secondary | ICD-10-CM | POA: Diagnosis not present

## 2021-01-22 DIAGNOSIS — R1312 Dysphagia, oropharyngeal phase: Secondary | ICD-10-CM | POA: Diagnosis not present

## 2021-01-22 DIAGNOSIS — I69391 Dysphagia following cerebral infarction: Secondary | ICD-10-CM | POA: Diagnosis not present

## 2021-01-22 DIAGNOSIS — I69354 Hemiplegia and hemiparesis following cerebral infarction affecting left non-dominant side: Secondary | ICD-10-CM | POA: Diagnosis not present

## 2021-01-22 DIAGNOSIS — I69312 Visuospatial deficit and spatial neglect following cerebral infarction: Secondary | ICD-10-CM | POA: Diagnosis not present

## 2021-01-22 DIAGNOSIS — I69392 Facial weakness following cerebral infarction: Secondary | ICD-10-CM | POA: Diagnosis not present

## 2021-01-23 ENCOUNTER — Telehealth: Payer: Self-pay

## 2021-01-23 DIAGNOSIS — I2510 Atherosclerotic heart disease of native coronary artery without angina pectoris: Secondary | ICD-10-CM | POA: Diagnosis not present

## 2021-01-23 DIAGNOSIS — Z9181 History of falling: Secondary | ICD-10-CM | POA: Diagnosis not present

## 2021-01-23 DIAGNOSIS — Z7901 Long term (current) use of anticoagulants: Secondary | ICD-10-CM | POA: Diagnosis not present

## 2021-01-23 DIAGNOSIS — I69328 Other speech and language deficits following cerebral infarction: Secondary | ICD-10-CM | POA: Diagnosis not present

## 2021-01-23 DIAGNOSIS — I110 Hypertensive heart disease with heart failure: Secondary | ICD-10-CM | POA: Diagnosis not present

## 2021-01-23 DIAGNOSIS — I428 Other cardiomyopathies: Secondary | ICD-10-CM | POA: Diagnosis not present

## 2021-01-23 DIAGNOSIS — I739 Peripheral vascular disease, unspecified: Secondary | ICD-10-CM | POA: Diagnosis not present

## 2021-01-23 DIAGNOSIS — Z79891 Long term (current) use of opiate analgesic: Secondary | ICD-10-CM | POA: Diagnosis not present

## 2021-01-23 DIAGNOSIS — I482 Chronic atrial fibrillation, unspecified: Secondary | ICD-10-CM | POA: Diagnosis not present

## 2021-01-23 DIAGNOSIS — Z6841 Body Mass Index (BMI) 40.0 and over, adult: Secondary | ICD-10-CM | POA: Diagnosis not present

## 2021-01-23 DIAGNOSIS — I69312 Visuospatial deficit and spatial neglect following cerebral infarction: Secondary | ICD-10-CM | POA: Diagnosis not present

## 2021-01-23 DIAGNOSIS — F411 Generalized anxiety disorder: Secondary | ICD-10-CM | POA: Diagnosis not present

## 2021-01-23 DIAGNOSIS — Z86718 Personal history of other venous thrombosis and embolism: Secondary | ICD-10-CM | POA: Diagnosis not present

## 2021-01-23 DIAGNOSIS — I69392 Facial weakness following cerebral infarction: Secondary | ICD-10-CM | POA: Diagnosis not present

## 2021-01-23 DIAGNOSIS — Z9981 Dependence on supplemental oxygen: Secondary | ICD-10-CM | POA: Diagnosis not present

## 2021-01-23 DIAGNOSIS — I69354 Hemiplegia and hemiparesis following cerebral infarction affecting left non-dominant side: Secondary | ICD-10-CM | POA: Diagnosis not present

## 2021-01-23 DIAGNOSIS — I69391 Dysphagia following cerebral infarction: Secondary | ICD-10-CM | POA: Diagnosis not present

## 2021-01-23 DIAGNOSIS — I5022 Chronic systolic (congestive) heart failure: Secondary | ICD-10-CM | POA: Diagnosis not present

## 2021-01-23 DIAGNOSIS — E785 Hyperlipidemia, unspecified: Secondary | ICD-10-CM | POA: Diagnosis not present

## 2021-01-23 DIAGNOSIS — I272 Pulmonary hypertension, unspecified: Secondary | ICD-10-CM | POA: Diagnosis not present

## 2021-01-23 DIAGNOSIS — J449 Chronic obstructive pulmonary disease, unspecified: Secondary | ICD-10-CM | POA: Diagnosis not present

## 2021-01-23 DIAGNOSIS — K219 Gastro-esophageal reflux disease without esophagitis: Secondary | ICD-10-CM | POA: Diagnosis not present

## 2021-01-23 DIAGNOSIS — R1312 Dysphagia, oropharyngeal phase: Secondary | ICD-10-CM | POA: Diagnosis not present

## 2021-01-23 DIAGNOSIS — Z7951 Long term (current) use of inhaled steroids: Secondary | ICD-10-CM | POA: Diagnosis not present

## 2021-01-23 NOTE — Telephone Encounter (Signed)
Gave verbal order to physical therapy advanced home [phone]  Once a week for 1 week and twice a week for 4 week and once a week for 4 week

## 2021-01-24 ENCOUNTER — Telehealth (INDEPENDENT_AMBULATORY_CARE_PROVIDER_SITE_OTHER): Payer: Medicare Other | Admitting: Gastroenterology

## 2021-01-24 DIAGNOSIS — K580 Irritable bowel syndrome with diarrhea: Secondary | ICD-10-CM

## 2021-01-24 DIAGNOSIS — K219 Gastro-esophageal reflux disease without esophagitis: Secondary | ICD-10-CM

## 2021-01-24 DIAGNOSIS — I639 Cerebral infarction, unspecified: Secondary | ICD-10-CM

## 2021-01-24 NOTE — Progress Notes (Signed)
Laura [name] , MD [address]  [place]  [place], [place] [address]  Main: [phone]  Fax: [phone]   Primary Care Physician: Laura Guise, MD  Virtual Visit via Video Note  I connected with patient on 01/24/21 at  1:00 PM EST by video and verified that I am speaking with the correct person using two identifiers.   I discussed the limitations, risks, security and privacy concerns of performing an evaluation and management service by video  and the availability of in person appointments. I also discussed with the patient that there may be a patient responsible charge related to this service. The patient expressed understanding and agreed to proceed.  Location of Patient: Home Location of Provider: Home Persons involved: Patient and provider only   History of Present Illness:   Constipation follow up   HPI: Laura [name] is a 70 y.o. female   Summary of history :  She was initially referred and seen on 01/25/2020 for diarrhea.She has previously been a patient of [facility] gastroenterology.She carries a diagnosis of collagenous colitis noted in 2017. Looking back at colon biopsy results from 2013 the biopsy resultsare notvery specific for collagenous colitis.She was hospitalized in November 2020 and underwent resection of the small bowel due to ischemia. During the surgery she underwent ileum and right colon resection. Had a second look surgery.Shesays that prior to the surgery she was having features of constipation alternating with diarrhea. Since the surgery she has been having only diarrhea up to 10-15 times a day. At times she has accidents and she has to wear a diaper to prevent soiling her undergarments. She denies any use of any artificial sugars, NSAIDs, Metformin. She wishes to transfer care to our care she was on a PPI which has been stopped. Failed treatment with Questran, imodium and CREON  09/18/2019: CT scan of the abdomen shows status post distal  small bowel resection with right hemicolectomy and enterocolonic anastomosis.  01/02/2020: Ferritin 25. B12 and folate normal. Hemoglobin 10.7 g in January 2021 with an MCV of 85.  01/27/2020: Fecal calprotectin normal, celiac serology negative. Stool for C. difficile toxin was negative. GI PCR of her stool showed enteroaggregative E. coli positive. Commenced on azithromycin.  03/04/2020: Stool PCR- negative  03/19/2020: Biopsies taken of the terminal ileum as well as colon. No evidence of any form of colitis or inflammation. In November 2021 had a acute right MCA ischemic stroke likely embolic due to atrial fibrillation.  Occurred when she went off her anticoagulation for lumbar injection   Interval history1/08/2021-01/24/2021  Since her last visit with me 3 weeks back she says she has been doing great.  She has started to eat much healthier.  Has fruit and vegetable at every meal.  She is having a bowel movement daily.  She takes 1 capful of MiraLAX daily.  Denies any discomfort.  She says her appetite is improved and she feels much healthier.  She is taking her PPI daily.  No other complaints.     Current Outpatient Medications  Medication Sig Dispense Refill  . Accu-Chek Softclix Lancets lancets Use as instructed to check blood sugars daily 2 hours after meal 100 each 12  . albuterol (VENTOLIN HFA) 108 (90 Base) MCG/ACT inhaler Inhale 2 puffs into the lungs every 4 (four) hours as needed for wheezing or shortness of breath. 18 g 3  . ALPRAZolam (XANAX) 0.25 MG tablet Take 0.25 mg by mouth 2 (two) times daily as needed for  anxiety.    Marland Kitchen apixaban (ELIQUIS) 5 MG TABS tablet Take 1 tablet (5 mg total) by mouth 2 (two) times daily. 60 tablet 6  . azithromycin (ZITHROMAX) 250 MG tablet Take one tablet by mouth once daily for 10 days 10 each 0  . azithromycin (ZITHROMAX) 250 MG tablet Take 1 tab by mouth every day for 10 days 10 each 0  . benzonatate (TESSALON) 100 MG capsule Take 1  capsule (100 mg total) by mouth 2 (two) times daily as needed for cough. 20 capsule 2  . budesonide (PULMICORT) 0.25 MG/2ML nebulizer solution Take 2 mLs (0.25 mg total) by nebulization 2 (two) times daily. 60 mL 12  . digoxin (LANOXIN) 0.125 MG tablet Take 1 tablet (0.125 mg total) by mouth daily. 90 tablet 1  . diltiazem (CARDIZEM CD) 360 MG 24 hr capsule Take 1 capsule (360 mg total) by mouth daily. 90 capsule 2  . EPINEPHrine 0.3 mg/0.3 mL IJ SOAJ injection Inject 0.3 mg into the muscle as needed for anaphylaxis.     Marland Kitchen gabapentin (NEURONTIN) 100 MG capsule Take 1 capsule (100 mg total) by mouth 3 (three) times daily. 90 capsule 3  . glucose blood (ACCU-CHEK GUIDE) test strip Use as instructed to check blood sugars daily 2 hours after meal 100 each 12  . ipratropium-albuterol (DUONEB) 0.5-2.5 (3) MG/3ML SOLN Take 3 mLs by nebulization every 4 (four) hours as needed. 120 mL 3  . loperamide (IMODIUM) 2 MG capsule Take 4 mg by mouth as needed for diarrhea or loose stools.    Marland Kitchen losartan (COZAAR) 25 MG tablet Take 1 tablet (25 mg total) by mouth daily. 90 tablet 1  . Melatonin 10 MG TABS Take 10 mg by mouth at bedtime as needed.    . montelukast (SINGULAIR) 10 MG tablet Take 1 tablet (10 mg total) by mouth daily. 90 tablet 1  . ondansetron (ZOFRAN) 4 MG tablet Take 1 tablet (4 mg total) by mouth every 8 (eight) hours as needed for nausea or vomiting. 60 tablet 1  . predniSONE (DELTASONE) 10 MG tablet Take 1 tablet (10 mg total) by mouth daily with breakfast. 30 tablet 0  . predniSONE (DELTASONE) 10 MG tablet Take one tab 3 x day for 3 days, then take one tab 2 x a day for 3 days and then take one tab a day for 3 days for COPD 18 tablet 0  . predniSONE (DELTASONE) 10 MG tablet TAKE 1 TABLET BY MOUTH DAILY WITH BREAKFAST. (GENERIC FOR DELTASONE) 90 tablet 0  . Probiotic Product (PROBIOTIC-10) CAPS Take 1 capsule by mouth daily.     . promethazine (PHENERGAN) 12.5 MG tablet TAKE 1 TABLET (12.5 MG TOTAL)  BY MOUTH EVERY 12 (TWELVE) HOURS AS NEEDED FOR NAUSEA OR VOMITING. 30 tablet 0  . rosuvastatin (CRESTOR) 20 MG tablet Take 1 tablet (20 mg total) by mouth daily. 90 tablet 3  . theophylline (THEO-24) 100 MG 24 hr capsule Take 1 capsule (100 mg total) by mouth daily. 30 capsule 3  . traMADol (ULTRAM) 50 MG tablet Take 50 mg by mouth every 6 (six) hours as needed for moderate pain.      No current facility-administered medications for this visit.    Allergies as of 01/24/2021 - Review Complete 01/14/2021  Allergen Reaction Noted  . Flecainide Shortness Of Breath 11/27/2016  . Metoprolol Shortness Of Breath, Swelling, and Other (See Comments) 01/13/2020  . Propafenone Shortness Of Breath, Swelling, and Other (See Comments) 12/01/2016  . Rivaroxaban  Swelling and Other (See Comments) 10/23/2016    Review of Systems:    All systems reviewed and negative except where noted in HPI.  General Appearance:    Alert, cooperative, no distress, appears stated age  Head:    Normocephalic, without obvious abnormality, atraumatic  Eyes:    PERRL, conjunctiva/corneas clear,  Ears:    Grossly normal hearing    Neurologic:  Grossly normal    Observations/Objective:  Labs: CMP     Component Value Date/Time   NA 137 12/04/2020 1517   NA 140 11/28/2013 2312   K 3.9 12/04/2020 1517   K 4.1 11/28/2013 2312   CL 98 12/04/2020 1517   CL 108 (H) 11/28/2013 2312   CO2 20 12/04/2020 1517   CO2 27 11/28/2013 2312   GLUCOSE 120 (H) 12/04/2020 1517   GLUCOSE 127 (H) 12/01/2020 1104   GLUCOSE 130 (H) 11/28/2013 2312   BUN 13 12/04/2020 1517   BUN 29 (H) 11/28/2013 2312   CREATININE 0.85 12/04/2020 1517   CREATININE 0.79 11/28/2013 2312   CALCIUM 10.6 (H) 12/04/2020 1517   CALCIUM 8.7 11/28/2013 2312   PROT 8.2 (H) 12/01/2020 1104   PROT 6.4 10/12/2020 1112   PROT 6.9 11/28/2013 2312   ALBUMIN 4.1 12/01/2020 1104   ALBUMIN 3.9 10/12/2020 1112   ALBUMIN 3.6 11/28/2013 2312   AST 26 12/01/2020 1104    AST 24 11/28/2013 2312   ALT 14 12/01/2020 1104   ALT 21 11/28/2013 2312   ALKPHOS 62 12/01/2020 1104   ALKPHOS 65 11/28/2013 2312   BILITOT 1.4 (H) 12/01/2020 1104   BILITOT 0.5 10/12/2020 1112   BILITOT 0.3 11/28/2013 2312   GFRNONAA 70 12/04/2020 1517   GFRNONAA 30 (L) 12/01/2020 1104   GFRNONAA >60 11/28/2013 2312   GFRAA 80 12/04/2020 1517   GFRAA >60 11/28/2013 2312   Lab Results  Component Value Date   WBC 9.3 12/01/2020   HGB 15.7 (H) 12/01/2020   HCT 46.9 (H) 12/01/2020   MCV 90.0 12/01/2020   PLT 379 12/01/2020    Imaging Studies: No results found.  Assessment and Plan:   EMONEE AVE is a 70 y.o. y/o female  who recently had stroke affecting her MCA territory and causing weakness on the left side of her body.    At her last visit she had features of GERD and constipation.  Her diet was changed with an increase in fiber content and added 1 capful of MiraLAX daily.  She is taking her PPI.  Since her last visit all symptoms have resolved.  She feels much better.  I have advised her to continue to take the high-fiber diet and MiraLAX as needed.  She will follow-up with me as needed.     I discussed the assessment and treatment plan with the patient. The patient was provided an opportunity to ask questions and all were answered. The patient agreed with the plan and demonstrated an understanding of the instructions.   The patient was advised to call back or seek an in-person evaluation if the symptoms worsen or if the condition fails to improve as anticipated.  I provided 11 minutes of face-to-face time during this encounter.  Dr Laura Bellows MD,MRCP [facility]) Gastroenterology/Hepatology Pager: [phone]   Speech recognition software was used to dictate this note.

## 2021-01-25 ENCOUNTER — Other Ambulatory Visit: Payer: Self-pay

## 2021-01-25 DIAGNOSIS — I69391 Dysphagia following cerebral infarction: Secondary | ICD-10-CM | POA: Diagnosis not present

## 2021-01-25 DIAGNOSIS — I69392 Facial weakness following cerebral infarction: Secondary | ICD-10-CM | POA: Diagnosis not present

## 2021-01-25 DIAGNOSIS — R1312 Dysphagia, oropharyngeal phase: Secondary | ICD-10-CM | POA: Diagnosis not present

## 2021-01-25 DIAGNOSIS — I69312 Visuospatial deficit and spatial neglect following cerebral infarction: Secondary | ICD-10-CM | POA: Diagnosis not present

## 2021-01-25 DIAGNOSIS — I69328 Other speech and language deficits following cerebral infarction: Secondary | ICD-10-CM | POA: Diagnosis not present

## 2021-01-25 DIAGNOSIS — I69354 Hemiplegia and hemiparesis following cerebral infarction affecting left non-dominant side: Secondary | ICD-10-CM | POA: Diagnosis not present

## 2021-01-25 MED ORDER — LOSARTAN POTASSIUM 25 MG PO TABS: 25.0000 mg | ORAL_TABLET | Freq: Every day | ORAL | 1 refills | Status: DC

## 2021-01-28 DIAGNOSIS — M47817 Spondylosis without myelopathy or radiculopathy, lumbosacral region: Secondary | ICD-10-CM | POA: Diagnosis not present

## 2021-01-29 DIAGNOSIS — I69391 Dysphagia following cerebral infarction: Secondary | ICD-10-CM | POA: Diagnosis not present

## 2021-01-29 DIAGNOSIS — R1312 Dysphagia, oropharyngeal phase: Secondary | ICD-10-CM | POA: Diagnosis not present

## 2021-01-29 DIAGNOSIS — I69354 Hemiplegia and hemiparesis following cerebral infarction affecting left non-dominant side: Secondary | ICD-10-CM | POA: Diagnosis not present

## 2021-01-29 DIAGNOSIS — I69392 Facial weakness following cerebral infarction: Secondary | ICD-10-CM | POA: Diagnosis not present

## 2021-01-29 DIAGNOSIS — I69312 Visuospatial deficit and spatial neglect following cerebral infarction: Secondary | ICD-10-CM | POA: Diagnosis not present

## 2021-01-29 DIAGNOSIS — I69328 Other speech and language deficits following cerebral infarction: Secondary | ICD-10-CM | POA: Diagnosis not present

## 2021-01-31 ENCOUNTER — Other Ambulatory Visit: Payer: Self-pay | Admitting: Hospice and Palliative Medicine

## 2021-01-31 ENCOUNTER — Other Ambulatory Visit: Payer: Self-pay | Admitting: Cardiovascular Disease

## 2021-01-31 DIAGNOSIS — R0602 Shortness of breath: Secondary | ICD-10-CM

## 2021-02-01 ENCOUNTER — Other Ambulatory Visit: Payer: Self-pay

## 2021-02-01 DIAGNOSIS — I69354 Hemiplegia and hemiparesis following cerebral infarction affecting left non-dominant side: Secondary | ICD-10-CM | POA: Diagnosis not present

## 2021-02-01 DIAGNOSIS — R1312 Dysphagia, oropharyngeal phase: Secondary | ICD-10-CM | POA: Diagnosis not present

## 2021-02-01 DIAGNOSIS — I69328 Other speech and language deficits following cerebral infarction: Secondary | ICD-10-CM | POA: Diagnosis not present

## 2021-02-01 DIAGNOSIS — I69391 Dysphagia following cerebral infarction: Secondary | ICD-10-CM | POA: Diagnosis not present

## 2021-02-01 DIAGNOSIS — I69312 Visuospatial deficit and spatial neglect following cerebral infarction: Secondary | ICD-10-CM | POA: Diagnosis not present

## 2021-02-01 DIAGNOSIS — I69392 Facial weakness following cerebral infarction: Secondary | ICD-10-CM | POA: Diagnosis not present

## 2021-02-01 MED ORDER — MONTELUKAST SODIUM 10 MG PO TABS: 10.0000 mg | ORAL_TABLET | Freq: Every day | ORAL | 1 refills | Status: DC

## 2021-02-01 MED ORDER — DIGOXIN 125 MCG PO TABS: 0.1250 mg | ORAL_TABLET | Freq: Every day | ORAL | 0 refills | Status: DC

## 2021-02-01 NOTE — Telephone Encounter (Signed)
*  STAT* If patient is at the pharmacy, call can be transferred to refill team.   1. Which medications need to be refilled? (please list name of each medication and dose if known) Digoxin  2. Which pharmacy/location (including street and city if local pharmacy) is medication to be sent to? [facility] [address]  3. Do they need a 30 day or 90 day supply? [place]

## 2021-02-04 DIAGNOSIS — I69328 Other speech and language deficits following cerebral infarction: Secondary | ICD-10-CM | POA: Diagnosis not present

## 2021-02-04 DIAGNOSIS — I69392 Facial weakness following cerebral infarction: Secondary | ICD-10-CM | POA: Diagnosis not present

## 2021-02-04 DIAGNOSIS — R1312 Dysphagia, oropharyngeal phase: Secondary | ICD-10-CM | POA: Diagnosis not present

## 2021-02-04 DIAGNOSIS — I69354 Hemiplegia and hemiparesis following cerebral infarction affecting left non-dominant side: Secondary | ICD-10-CM | POA: Diagnosis not present

## 2021-02-04 DIAGNOSIS — I69312 Visuospatial deficit and spatial neglect following cerebral infarction: Secondary | ICD-10-CM | POA: Diagnosis not present

## 2021-02-04 DIAGNOSIS — I69391 Dysphagia following cerebral infarction: Secondary | ICD-10-CM | POA: Diagnosis not present

## 2021-02-05 ENCOUNTER — Ambulatory Visit (INDEPENDENT_AMBULATORY_CARE_PROVIDER_SITE_OTHER): Payer: Medicare Other | Admitting: Podiatry

## 2021-02-05 ENCOUNTER — Other Ambulatory Visit: Payer: Self-pay

## 2021-02-05 DIAGNOSIS — B351 Tinea unguium: Secondary | ICD-10-CM | POA: Diagnosis not present

## 2021-02-05 DIAGNOSIS — M79675 Pain in left toe(s): Secondary | ICD-10-CM | POA: Diagnosis not present

## 2021-02-05 DIAGNOSIS — M79674 Pain in right toe(s): Secondary | ICD-10-CM

## 2021-02-05 NOTE — Progress Notes (Signed)
   SUBJECTIVE Patient presents to office today complaining of elongated, thickened nails that cause pain while ambulating in shoes.  She is unable to trim her own nails.  Patient has history of stroke left side approximately 3 months ago and is currently on anticoagulant medication.  Patient is here for further evaluation and treatment.  Past Medical History:  Diagnosis Date  . Asthma   . Chronic atrial fibrillation ([place])    a. diagnosed in 09/2016; b. failed flecainide and propafenone due to LE swelling and SOB, could not afford Multaq; c. CHADS2VASc => 5 (CHF, HTN, age x 1, nonobs CAD, female)--> Eliquis  . COPD (chronic obstructive pulmonary disease) ([place])   . GERD (gastroesophageal reflux disease)   . HFmrEF (heart failure with mid-range ejection fraction) ([place])    a. 12/2019 Echo: EF 40-45%.  . Hyperlipidemia   . Hypertension   . NICM (nonischemic cardiomyopathy) ([place])    a. 12/2019 Echo: EF 40-45%, glob HK, mildly reduced RV fxn, sev dil LA. *HR 130 (afib) during study.  . Nonobstructive CAD (coronary artery disease)    a. Lexiscan Myoview 10/2016: no evidence of ischemia, EF 53%; b. 02/2020 Cath: LM nl, LAD 20p, 30m, LCX 20ost, OM1/2/3 nl, LPDA nl, LPL1/2 nl, LPAV nl, RCA small, nl.  . Obesity   . Obstructive sleep apnea   . Pulmonary hypertension ([place])   . Sleep apnea   . Stroke ([place])   . Systolic dysfunction    a. TTE 10/2016: EF 50%, mild LVH, moderately dilated LA, moderate MR/TR, mild pulmonary hypertension    OBJECTIVE General Patient is awake, alert, and oriented x 3 and in no acute distress. Derm Skin is dry and supple bilateral. Negative open lesions or macerations. Remaining integument unremarkable. Nails are tender, long, thickened and dystrophic with subungual debris, consistent with onychomycosis, 1-5 bilateral. No signs of infection noted. Vasc  DP and PT pedal pulses palpable bilaterally. Temperature gradient within normal limits.  Neuro Epicritic and protective  threshold sensation grossly intact bilaterally.  Musculoskeletal Exam No symptomatic pedal deformities noted bilateral. Muscular strength within normal limits.  ASSESSMENT 1. Onychodystrophic nails 1-5 bilateral with hyperkeratosis of nails.  2. Onychomycosis of nail due to dermatophyte bilateral 3. Pain in foot bilateral  PLAN OF CARE 1. Patient evaluated today.  2. Instructed to maintain good pedal hygiene and foot care.  3. Mechanical debridement of nails 1-5 bilaterally performed using a nail nipper. Filed with dremel without incident.  4. Return to clinic in 3 mos.    Edrick Kins, DPM [facility]  Dr. Edrick Kins, [place]                                        [place], [place] [address]                Office [phone]  Fax [phone]

## 2021-02-06 ENCOUNTER — Telehealth: Payer: Self-pay | Admitting: Adult Health

## 2021-02-06 NOTE — Telephone Encounter (Signed)
Pt. states her foot has started swelling on the same side she had a stroke & she's not sure why as it will not stop. Please advise.

## 2021-02-06 NOTE — Telephone Encounter (Signed)
Agree with your recommendations.  Thank you.

## 2021-02-06 NOTE — Telephone Encounter (Addendum)
I called pt , she is getting HHPT and is more active.  I relayed that due to residual effects of stroke, decreaed mobility decreases blood flow and may build up swelling in affected side.  More activity is good to do.  She is on lasix and she will touch base with pcp, adjust??  Compression stockings for extremities are good too as well as keeping elevated when sitting.  She was afraid she was having another stroke.  I told her that no, is a possible after-effect of having had a stroke if some deficit.

## 2021-02-07 ENCOUNTER — Ambulatory Visit: Payer: Medicare Other | Admitting: Hospice and Palliative Medicine

## 2021-02-08 DIAGNOSIS — I69391 Dysphagia following cerebral infarction: Secondary | ICD-10-CM | POA: Diagnosis not present

## 2021-02-08 DIAGNOSIS — R1312 Dysphagia, oropharyngeal phase: Secondary | ICD-10-CM | POA: Diagnosis not present

## 2021-02-08 DIAGNOSIS — I69312 Visuospatial deficit and spatial neglect following cerebral infarction: Secondary | ICD-10-CM | POA: Diagnosis not present

## 2021-02-08 DIAGNOSIS — I69392 Facial weakness following cerebral infarction: Secondary | ICD-10-CM | POA: Diagnosis not present

## 2021-02-08 DIAGNOSIS — I69328 Other speech and language deficits following cerebral infarction: Secondary | ICD-10-CM | POA: Diagnosis not present

## 2021-02-08 DIAGNOSIS — I69354 Hemiplegia and hemiparesis following cerebral infarction affecting left non-dominant side: Secondary | ICD-10-CM | POA: Diagnosis not present

## 2021-02-11 DIAGNOSIS — M545 Low back pain, unspecified: Secondary | ICD-10-CM | POA: Diagnosis not present

## 2021-02-11 DIAGNOSIS — M62830 Muscle spasm of back: Secondary | ICD-10-CM | POA: Diagnosis not present

## 2021-02-11 DIAGNOSIS — M479 Spondylosis, unspecified: Secondary | ICD-10-CM | POA: Diagnosis not present

## 2021-02-12 DIAGNOSIS — I69354 Hemiplegia and hemiparesis following cerebral infarction affecting left non-dominant side: Secondary | ICD-10-CM | POA: Diagnosis not present

## 2021-02-12 DIAGNOSIS — I69391 Dysphagia following cerebral infarction: Secondary | ICD-10-CM | POA: Diagnosis not present

## 2021-02-12 DIAGNOSIS — I69392 Facial weakness following cerebral infarction: Secondary | ICD-10-CM | POA: Diagnosis not present

## 2021-02-12 DIAGNOSIS — I69328 Other speech and language deficits following cerebral infarction: Secondary | ICD-10-CM | POA: Diagnosis not present

## 2021-02-12 DIAGNOSIS — I69312 Visuospatial deficit and spatial neglect following cerebral infarction: Secondary | ICD-10-CM | POA: Diagnosis not present

## 2021-02-12 DIAGNOSIS — R1312 Dysphagia, oropharyngeal phase: Secondary | ICD-10-CM | POA: Diagnosis not present

## 2021-02-14 ENCOUNTER — Encounter: Payer: Self-pay | Admitting: Cardiovascular Disease

## 2021-02-14 ENCOUNTER — Ambulatory Visit (INDEPENDENT_AMBULATORY_CARE_PROVIDER_SITE_OTHER): Payer: Medicare Other | Admitting: Cardiovascular Disease

## 2021-02-14 ENCOUNTER — Other Ambulatory Visit: Payer: Self-pay

## 2021-02-14 VITALS — BP 136/62 | HR 66 | Ht 64.0 in | Wt 234.0 lb

## 2021-02-14 DIAGNOSIS — G4730 Sleep apnea, unspecified: Secondary | ICD-10-CM

## 2021-02-14 DIAGNOSIS — I482 Chronic atrial fibrillation, unspecified: Secondary | ICD-10-CM

## 2021-02-14 DIAGNOSIS — I639 Cerebral infarction, unspecified: Secondary | ICD-10-CM

## 2021-02-14 DIAGNOSIS — I10 Essential (primary) hypertension: Secondary | ICD-10-CM | POA: Diagnosis not present

## 2021-02-14 IMAGING — CR DG CHEST 2V
1 series · 2 of 2 positions shown · non-contrast
Comparison: 10/03/2020

CLINICAL DATA: Short of breath, cough for 5 weeks

EXAM:
CHEST - 2 VIEW

[Series 1: dg chest 2 view · 0.14mm/px · 2 of 2 slices shown]
[im 1/2]
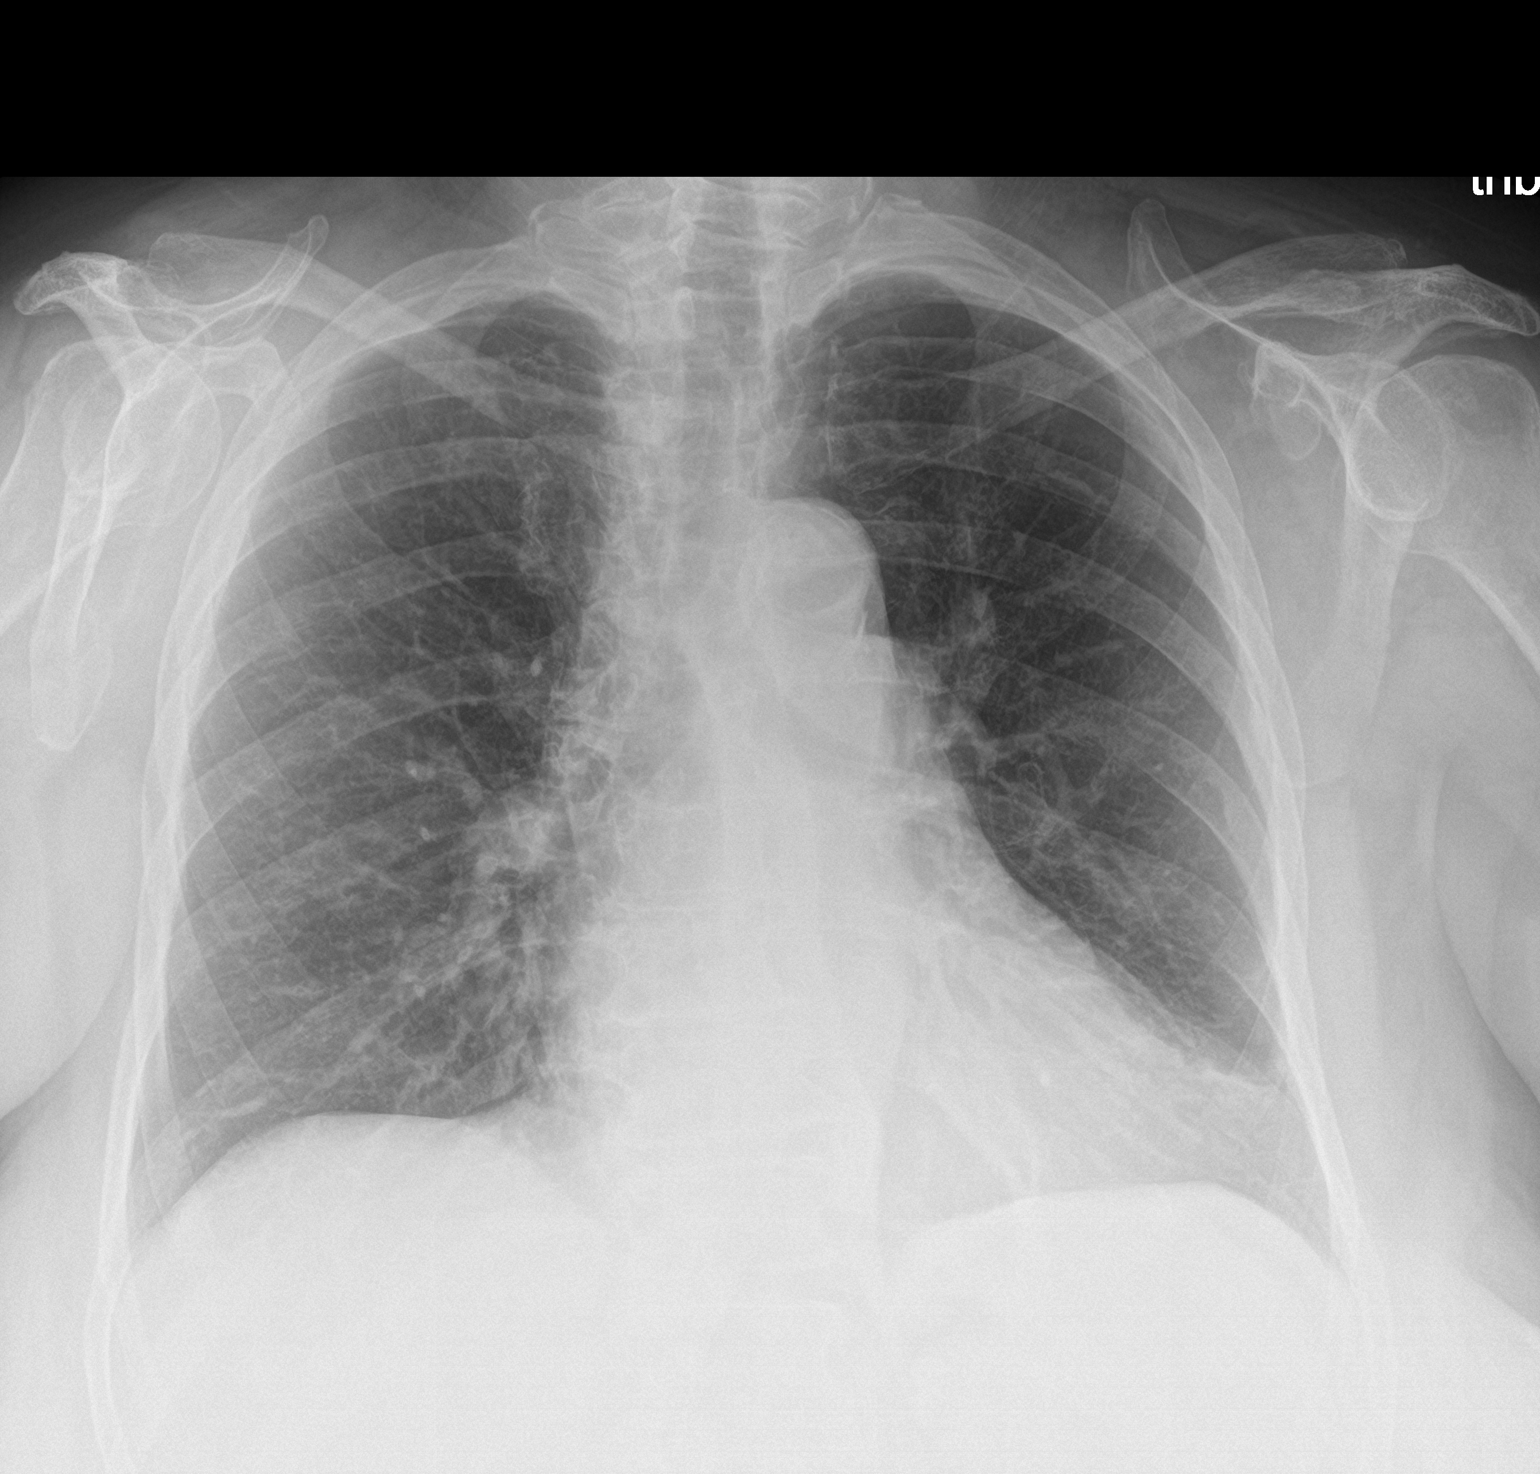
[im 2/2]
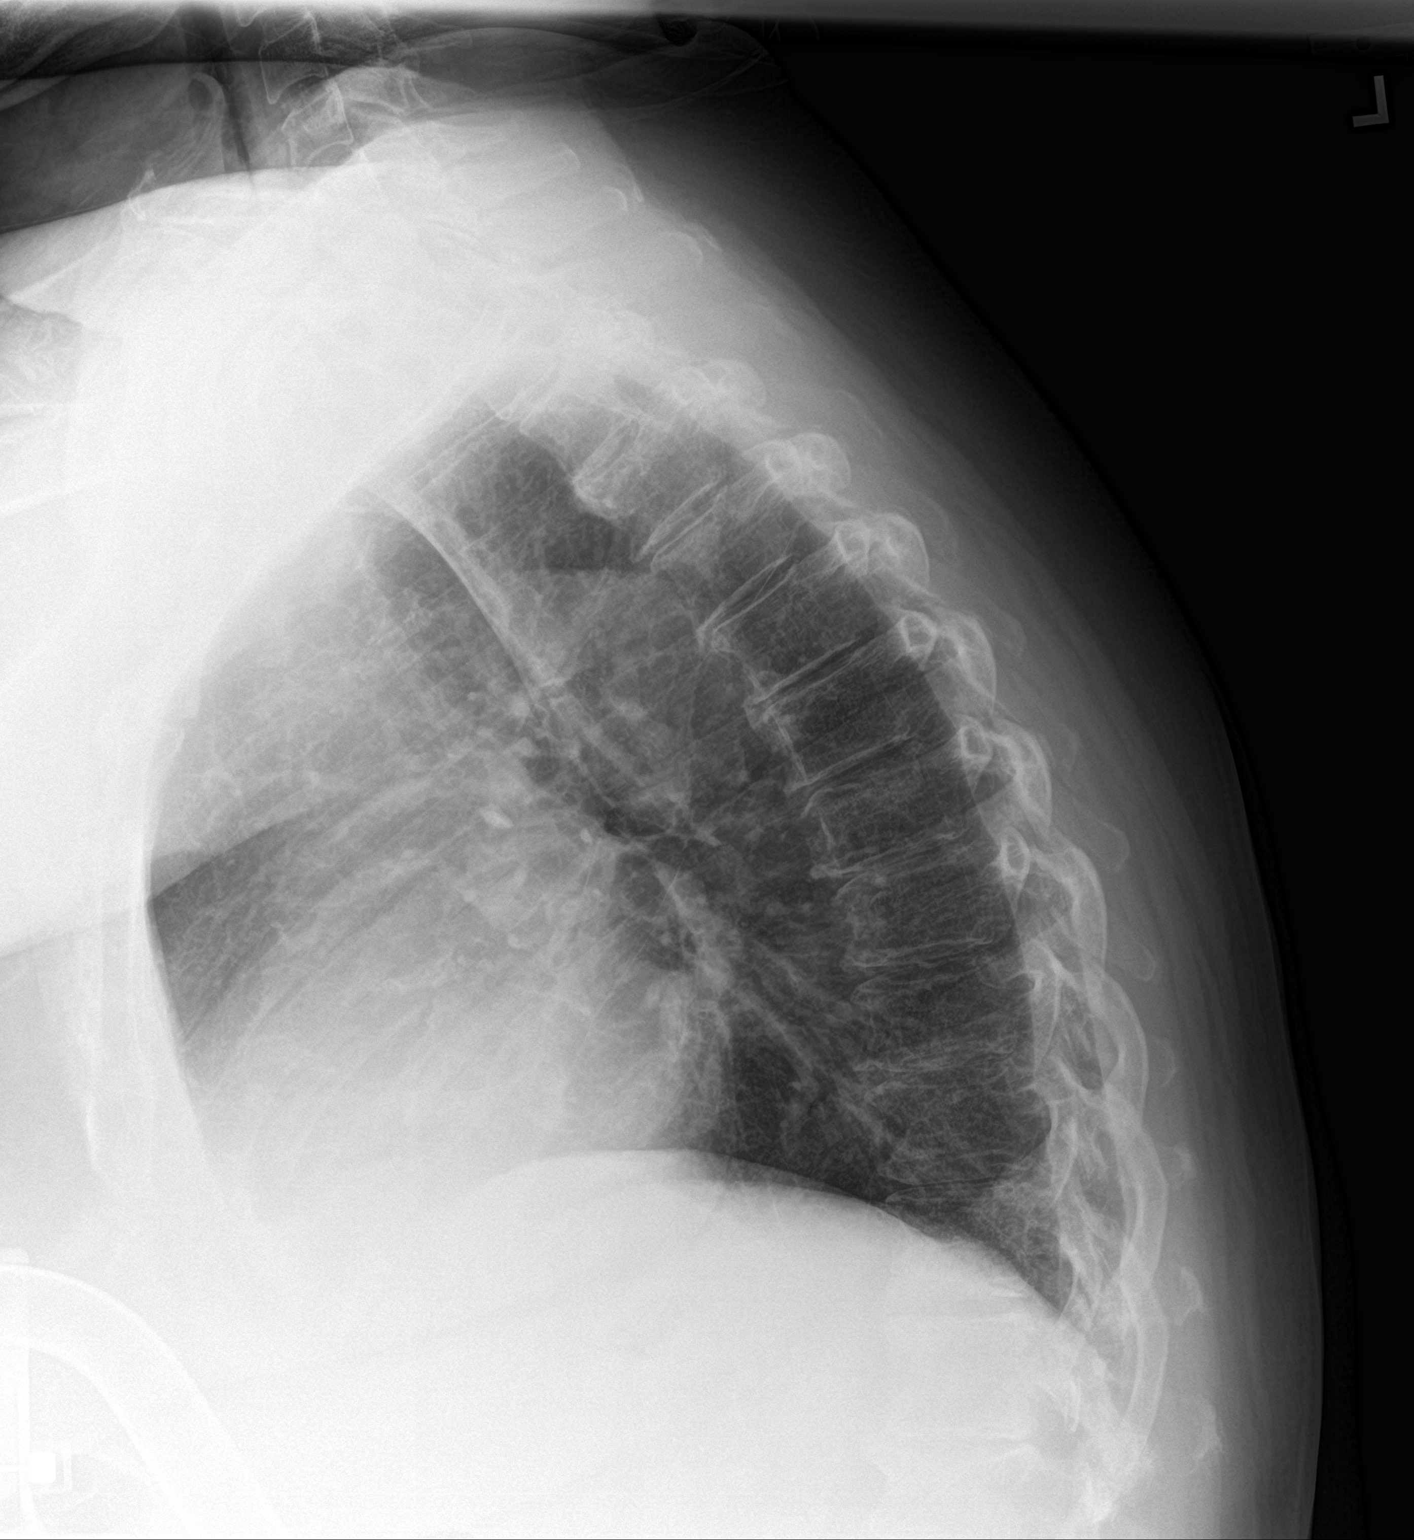

[2 of 2 positions shown; findings below may reference images not displayed]

FINDINGS: The heart size and mediastinal contours are within normal limits.
Both lungs are clear. The visualized skeletal structures are
unremarkable.
IMPRESSION: No active cardiopulmonary disease.

## 2021-02-14 NOTE — Progress Notes (Signed)
Cardiology Office Note   Date:  02/14/2021   ID:  Laura, [name] 1951/10/15, MRN [id number]  PCP:  [name], MD  Cardiologist:   Kathlyn Sacramento, MD   Chief Complaint  Patient presents with  . 3 month follow up    "doing well since the stroke." Medications reviewed by the patient verbally.       History of Present Illness: Laura [name] is a 70 y.o. female who is here today for a follow-up visit regarding chronic atrial fibrillation and chronic systolic heart failure.  The patient has chronic medical conditions that include obesity, sleep apnea on CPAP, asthma, chronic systolic heart failure due to nonischemic cardiomyopathy, nonobstructive CAD, CVA and essential hypertension. She was diagnosed with atrial fibrillation in October 2017. Previous cardiac work-up was done in 2017 included a pharmacologic nuclear stress test which showed no evidence of ischemia with ejection fraction of 53%. She had an echocardiogram done which showed mildly reduced LV systolic function with an EF of 50%, mild LVH, moderately dilated left atrium, moderate mitral and tricuspid regurgitation with mild pulmonary hypertension. She did not tolerate multiple antiarrhythmic medications and has been treated with rate control. She was hospitalized in September of 2020 with ischemic colitis.  Angiogram showed no acute obstructive disease.  She underwent ileum and right colon resection.    She did not tolerate metoprolol due to increased dyspnea.  She underwent a right and left cardiac catheterization in March 2021 to evaluate pulmonary hypertension and exertional dyspnea.  It showed mild nonobstructive coronary artery disease.  Right heart catheterization showed moderately elevated filling pressures with mild pulmonary hypertension and normal cardiac output.  Her pulmonary wedge pressure at that time was 24 mmHg.  Furosemide was resumed at that time. The patient was transitioned from Eliquis to warfarin  due to cost.  She was hospitalized in November with acute right MCA stroke in the setting of being off anticoagulation for lumbar injection.  Echocardiogram during that admission showed an EF of 45 to 50%. She was seen in the ED in December with dehydration.  She had COVID-19 in December but recovered.  She has recovered nicely from her stroke and has been following healthy diet.  She was able to lose weight and feeling significantly better.  Her mobility improved.  No chest pain or shortness of breath.  Pressure and heart rate are well controlled at home.  Past Medical History:  Diagnosis Date  . Asthma   . Chronic atrial fibrillation ([place])    a. diagnosed in 09/2016; b. failed flecainide and propafenone due to LE swelling and SOB, could not afford Multaq; c. CHADS2VASc => 5 (CHF, HTN, age x 1, nonobs CAD, female)--> Eliquis  . COPD (chronic obstructive pulmonary disease) ([place])   . GERD (gastroesophageal reflux disease)   . HFmrEF (heart failure with mid-range ejection fraction) ([place])    a. 12/2019 Echo: EF 40-45%.  . Hyperlipidemia   . Hypertension   . NICM (nonischemic cardiomyopathy) ([place])    a. 12/2019 Echo: EF 40-45%, glob HK, mildly reduced RV fxn, sev dil LA. *HR 130 (afib) during study.  . Nonobstructive CAD (coronary artery disease)    a. Lexiscan Myoview 10/2016: no evidence of ischemia, EF 53%; b. 02/2020 Cath: LM nl, LAD 20p, 39m, LCX 20ost, OM1/2/3 nl, LPDA nl, LPL1/2 nl, LPAV nl, RCA small, nl.  . Obesity   . Obstructive sleep apnea   . Pulmonary hypertension ([place])   . Sleep apnea   .  Stroke ([place])   . Systolic dysfunction    a. TTE 10/2016: EF 50%, mild LVH, moderately dilated LA, moderate MR/TR, mild pulmonary hypertension    Past Surgical History:  Procedure Laterality Date  . BOWEL RESECTION  09/11/2019   Procedure: SMALL BOWEL RESECTION;  Surgeon: Herbert Pun, MD;  Location: [facility] ORS;  Service: General;;  . CARDIAC CATHETERIZATION    . cataract surgery     . COLONOSCOPY WITH PROPOFOL N/A 03/19/2020   Procedure: COLONOSCOPY WITH PROPOFOL;  Surgeon: [name], MD;  Location: [facility] ENDOSCOPY;  Service: Gastroenterology;  Laterality: N/A;  . CORONARY ANGIOPLASTY    . INCISION AND DRAINAGE ABSCESS Right 06/29/2016   Procedure: INCISION AND DRAINAGE ABSCESS;  Surgeon: Florene Glen, MD;  Location: [facility] ORS;  Service: General;  Laterality: Right;  . INCISION AND DRAINAGE OF WOUND Left 06/29/2016   Procedure: IRRIGATION AND DEBRIDEMENT WOUND;  Surgeon: Florene Glen, MD;  Location: [facility] ORS;  Service: General;  Laterality: Left;  . IR ANGIO VERTEBRAL SEL SUBCLAVIAN INNOMINATE UNI R MOD SED  11/20/2020  . IR CT HEAD LTD  11/20/2020  . IR PERCUTANEOUS ART THROMBECTOMY/INFUSION INTRACRANIAL INC DIAG ANGIO  11/20/2020  . LAPAROSCOPIC RIGHT COLECTOMY  09/11/2019   Procedure: RIGHT COLECTOMY;  Surgeon: Herbert Pun, MD;  Location: [facility] ORS;  Service: General;;  . LAPAROSCOPY N/A 09/11/2019   Procedure: LAPAROSCOPY DIAGNOSTIC;  Surgeon: Herbert Pun, MD;  Location: [facility] ORS;  Service: General;  Laterality: N/A;  . LAPAROTOMY N/A 09/13/2019   Procedure: REOPENING OF RECENT LAPAROTOMYANASTOMOSIS OF BOWEL;  Surgeon: Herbert Pun, MD;  Location: [facility] ORS;  Service: General;  Laterality: N/A;  . RADIOLOGY WITH ANESTHESIA N/A 11/20/2020   Procedure: IR WITH ANESTHESIA - CODE STROKE;  Surgeon: Radiologist, Medication, MD;  Location: [facility];  Service: Radiology;  Laterality: N/A;  . RIGHT/LEFT HEART CATH AND CORONARY ANGIOGRAPHY Bilateral 02/27/2020   Procedure: RIGHT/LEFT HEART CATH AND CORONARY ANGIOGRAPHY;  Surgeon: Wellington Hampshire, MD;  Location: [place] CV LAB;  Service: Cardiovascular;  Laterality: Bilateral;  . VISCERAL ANGIOGRAPHY N/A 09/12/2019   Procedure: VISCERAL ANGIOGRAPHY;  Surgeon: Algernon Huxley, MD;  Location: [place] CV LAB;  Service: Cardiovascular;  Laterality: N/A;     Current Outpatient Medications   Medication Sig Dispense Refill  . Accu-Chek Softclix Lancets lancets Use as instructed to check blood sugars daily 2 hours after meal 100 each 12  . Acetaminophen-Codeine 300-30 MG tablet Take 1 tablet by mouth every 6 (six) hours.    Marland Kitchen albuterol (VENTOLIN HFA) 108 (90 Base) MCG/ACT inhaler Inhale 2 puffs into the lungs every 4 (four) hours as needed for wheezing or shortness of breath. 18 g 3  . ALPRAZolam (XANAX) 0.25 MG tablet Take 0.25 mg by mouth 2 (two) times daily as needed for anxiety.    Marland Kitchen apixaban (ELIQUIS) 5 MG TABS tablet Take 1 tablet (5 mg total) by mouth 2 (two) times daily. 60 tablet 6  . baclofen (LIORESAL) 10 MG tablet Take 10 mg by mouth 3 (three) times daily as needed.    . benzonatate (TESSALON) 100 MG capsule Take 1 capsule (100 mg total) by mouth 2 (two) times daily as needed for cough. 20 capsule 2  . budesonide (PULMICORT) 0.25 MG/2ML nebulizer solution Take 2 mLs (0.25 mg total) by nebulization 2 (two) times daily. 60 mL 12  . cholestyramine (QUESTRAN) 4 g packet cholestyramine (with sugar) 4 gram powder for susp in a packet    . digoxin (LANOXIN) 0.125 MG  tablet Take 1 tablet (0.125 mg total) by mouth daily. 90 tablet 0  . diltiazem (CARDIZEM CD) 360 MG 24 hr capsule Take 1 capsule (360 mg total) by mouth daily. 90 capsule 2  . diltiazem (CARDIZEM) 60 MG tablet diltiazem 60 mg tablet    . EPINEPHrine 0.3 mg/0.3 mL IJ SOAJ injection Inject 0.3 mg into the muscle as needed for anaphylaxis.     . fluticasone (FLONASE) 50 MCG/ACT nasal spray Place into both nostrils daily.    Marland Kitchen gabapentin (NEURONTIN) 100 MG capsule Take 1 capsule (100 mg total) by mouth 3 (three) times daily. 90 capsule 3  . glucose blood (ACCU-CHEK GUIDE) test strip Use as instructed to check blood sugars daily 2 hours after meal 100 each 12  . ipratropium-albuterol (DUONEB) 0.5-2.5 (3) MG/3ML SOLN Take 3 mLs by nebulization every 4 (four) hours as needed. 120 mL 3  . loperamide (IMODIUM) 2 MG capsule Take  4 mg by mouth as needed for diarrhea or loose stools.    Marland Kitchen losartan (COZAAR) 25 MG tablet Take 1 tablet (25 mg total) by mouth daily. 90 tablet 1  . Melatonin 10 MG TABS Take 10 mg by mouth at bedtime as needed.    . montelukast (SINGULAIR) 10 MG tablet Take 1 tablet (10 mg total) by mouth daily. 90 tablet 1  . ondansetron (ZOFRAN) 4 MG tablet Take 1 tablet (4 mg total) by mouth every 8 (eight) hours as needed for nausea or vomiting. 60 tablet 1  . predniSONE (DELTASONE) 10 MG tablet TAKE 1 TABLET (10 MG TOTAL) BY MOUTH DAILY WITH BREAKFAST. 30 tablet 1  . Probiotic Product (PROBIOTIC-10) CAPS Take 1 capsule by mouth daily.     . promethazine (PHENERGAN) 12.5 MG tablet TAKE 1 TABLET (12.5 MG TOTAL) BY MOUTH EVERY 12 (TWELVE) HOURS AS NEEDED FOR NAUSEA OR VOMITING. 30 tablet 0  . rosuvastatin (CRESTOR) 20 MG tablet Take 1 tablet (20 mg total) by mouth daily. 90 tablet 3  . theophylline (THEO-24) 100 MG 24 hr capsule Take 1 capsule (100 mg total) by mouth daily. 30 capsule 3  . traMADol (ULTRAM) 50 MG tablet Take 50 mg by mouth every 6 (six) hours as needed for moderate pain.      No current facility-administered medications for this visit.    Allergies:   Flecainide, Metoprolol, Propafenone, and Rivaroxaban    Social History:  The patient  reports that she has never smoked. She has never used smokeless tobacco. She reports that she does not drink alcohol and does not use drugs.   Family History:  The patient's family history includes COPD in her father; Dementia in her mother; Heart disease in her brother; Osteoporosis in her mother; Vascular Disease in her mother.    ROS:  Please see the history of present illness.   Otherwise, review of systems are positive for none.   All other systems are reviewed and negative.    PHYSICAL EXAM: VS:  BP 136/62 (BP Location: Left Arm, Patient Position: Sitting, Cuff Size: Normal)   Pulse 66   Ht 5\' 4"  (1.626 m)   Wt 234 lb (106.1 kg)   LMP  (LMP  Unknown)   SpO2 98%   BMI 40.17 kg/m  , BMI Body mass index is 40.17 kg/m. GEN: Well nourished, well developed, in no acute distress  HEENT: normal  Neck: no JVD, carotid bruits, or masses Cardiac: Irregularly irregular ; no murmurs, rubs, or gallops, +1 edema Respiratory:  clear to auscultation  bilaterally, normal work of breathing GI: soft, nontender, nondistended, + BS MS: no deformity or atrophy  Skin: warm and dry, no rash Neuro:  Strength and sensation are intact Psych: euthymic mood, full affect Radial pulses normal   EKG:  EKG is ordered today. The ekg ordered today demonstrates atrial fibrillation with ventricular rate of 66 bpm.  Incomplete right bundle branch block and possible old inferior infarct.   Recent Labs: 12/01/2020: ALT 14; Hemoglobin 15.7; Platelets 379 12/04/2020: BUN 13; Creatinine, Ser 0.85; Potassium 3.9; Sodium 137    Lipid Panel    Component Value Date/Time   CHOL 151 11/21/2020 0616   CHOL 196 03/20/2020 0830   TRIG 125 11/21/2020 0616   HDL 46 11/21/2020 0616   HDL 68 03/20/2020 0830   CHOLHDL 3.3 11/21/2020 0616   VLDL 25 11/21/2020 0616   LDLCALC 80 11/21/2020 0616   LDLCALC 103 (H) 03/20/2020 0830      Wt Readings from Last 3 Encounters:  02/14/21 234 lb (106.1 kg)  01/09/21 238 lb 9.6 oz (108.2 kg)  01/03/21 239 lb (108.4 kg)      PAD Screen 04/03/2017  Previous PAD dx? No  Previous surgical procedure? No  Pain with walking? No  Feet/toe relief with dangling? No  Painful, non-healing ulcers? No  Extremities discolored? No      ASSESSMENT AND PLAN:  1.  Nonobstructive coronary artery disease: No anginal symptoms.  Continue medical therapy.  2.  Chronic atrial fibrillation: Her ventricular rate has been well controlled and she had few episodes of renal failure recently.  Thus, I am going to discontinue digoxin and keep her on diltiazem for now.  3. Sleep apnea: Continue treatment with CPAP.  4. Essential hypertension:  Blood pressure is well controlled.  5.  Chronic systolic heart failure: Mildly reduced LV systolic function.  She did not tolerate beta-blockers.  She is euvolemic.  6.  History of CVA: Embolic in the setting of being off Eliquis.  Any future interruption of Eliquis will require bridging with low molecular weight heparin.    Disposition:   FU with me in  3 months. Signed,  Kathlyn Sacramento, MD  02/14/2021 8:20 AM    [facility]

## 2021-02-14 NOTE — Patient Instructions (Signed)
Medication Instructions:  Your physician has recommended you make the following change in your medication:   STOP Digoxin  *If you need a refill on your cardiac medications before your next appointment, please call your pharmacy*   Lab Work: None ordered If you have labs (blood work) drawn today and your tests are completely normal, you will receive your results only by: Marland Kitchen MyChart Message (if you have MyChart) OR . A paper copy in the mail If you have any lab test that is abnormal or we need to change your treatment, we will call you to review the results.   Testing/Procedures: None ordered   Follow-Up: At [facility], you and your health needs are our priority.  As part of our continuing mission to provide you with exceptional heart care, we have created designated Provider Care Teams.  These Care Teams include your primary Cardiologist (physician) and Advanced Practice Providers (APPs -  Physician Assistants and Nurse Practitioners) who all work together to provide you with the care you need, when you need it.  We recommend signing up for the patient portal called "MyChart".  Sign up information is provided on this After Visit Summary.  MyChart is used to connect with patients for Virtual Visits (Telemedicine).  Patients are able to view lab/test results, encounter notes, upcoming appointments, etc.  Non-urgent messages can be sent to your provider as well.   To learn more about what you can do with MyChart, go to [email or web address].    Your next appointment:   3 month(s)  The format for your next appointment:   In Person  Provider:   You may see Kathlyn Sacramento, MD or one of the following Advanced Practice Providers on your designated Care Team:    Murray Hodgkins, NP  Christell Faith, PA-C  Marrianne Mood, PA-C  Cadence Cordele, [place]  Laurann Montana, NP    Other Instructions N/A

## 2021-02-15 ENCOUNTER — Other Ambulatory Visit: Payer: Self-pay | Admitting: Cardiovascular Disease

## 2021-02-15 ENCOUNTER — Other Ambulatory Visit: Payer: Self-pay

## 2021-02-15 ENCOUNTER — Other Ambulatory Visit: Payer: Self-pay | Admitting: Hospice and Palliative Medicine

## 2021-02-15 ENCOUNTER — Other Ambulatory Visit: Payer: Self-pay | Admitting: Nurse Practitioner

## 2021-02-15 ENCOUNTER — Other Ambulatory Visit: Payer: Self-pay | Admitting: Gastroenterology

## 2021-02-15 ENCOUNTER — Other Ambulatory Visit: Payer: Self-pay | Admitting: *Deleted

## 2021-02-15 ENCOUNTER — Other Ambulatory Visit: Payer: Self-pay | Admitting: Family

## 2021-02-15 ENCOUNTER — Encounter: Payer: Self-pay | Admitting: *Deleted

## 2021-02-15 DIAGNOSIS — I69391 Dysphagia following cerebral infarction: Secondary | ICD-10-CM | POA: Diagnosis not present

## 2021-02-15 DIAGNOSIS — I69392 Facial weakness following cerebral infarction: Secondary | ICD-10-CM | POA: Diagnosis not present

## 2021-02-15 DIAGNOSIS — R1312 Dysphagia, oropharyngeal phase: Secondary | ICD-10-CM | POA: Diagnosis not present

## 2021-02-15 DIAGNOSIS — I69354 Hemiplegia and hemiparesis following cerebral infarction affecting left non-dominant side: Secondary | ICD-10-CM | POA: Diagnosis not present

## 2021-02-15 DIAGNOSIS — I69312 Visuospatial deficit and spatial neglect following cerebral infarction: Secondary | ICD-10-CM | POA: Diagnosis not present

## 2021-02-15 DIAGNOSIS — I69328 Other speech and language deficits following cerebral infarction: Secondary | ICD-10-CM | POA: Diagnosis not present

## 2021-02-15 NOTE — Patient Outreach (Signed)
[facility]) Care Management  02/15/2021  Laura [name] 1951/12/13 [id number]  [facility] outreach to complex care patient referred by [facility] Laura [name] was referred to [facility] on 12/04/20 for EMMI stroke referral issues related questions/problems with meds, lost interest in things they used to enjoy and symptoms of sad, hopeless, anxious, or empty was reported. After several unsuccessful outreaches, she returned a call on 12/12/20 to initiate EMMI Stroke services On 12/24/20 she reported further medication cost issues and was referred to [facility] admission 11/20/20-11/23/20 Acute R MCA ischemic stroke [facility]) s/p revascularization R M1, embolic d/t known AF 7/98/92-01/10/40 enteritis and Atrial fibrillation with rapid ventricular response Insurance [employer] Medicare and [employer]   Successful outreach to home number [phone]  Patient is able to verify HIPAA ([place]) identifiers Reviewed and addressed the purpose of the follow up call with the patient  Consent: [facility]) RN CM reviewed [name] with patient. Patient gave verbal consent for services.  Follow up assessment Laura [name] reports overall improvements except with back pain  There is a plan to receive "four blocks on Monday" and then see if she will need "burning" of sciatic nerve. She continues and is able to review her Physical Therapy (PT) exercises to assist with stretching and strengthening that includes sciatic exercises Her PT is scheduled to visit today at 12 pm  TENS unit use made back pain worsen Now ambulating with her cane only vs walker was able to walk the hallways to cardiology appointment on 02/14/21   Atrial fibrillation (Afib)  Review of Afib, noted improvements during 02/14/21 appointment Her cardiology wants her to always call if any provider attempts to discontinue her anticoagulant prior to consulting  cardiologist  Weight management continues to monitor her diet wt 234 lb  Hypertension (HTN) BP 130/70 02/14/21; with her HH PT values average at 120/68-70 98%  Loss of smell returned after a visit to ENT  Tips for aroma therapy provided and used to restore her sense of taste back    Respiratory She is off oxygen during day flonase resumed and has resolved coughing and wheezing   Goal Her goal is to drive again (include driving to church), regain her independence  [email or web address] continues to be assessed Confirms all EMMI issues remain resolved  No noted medication cost, purchase concerns Reports coping much better and spending more time with family, friends Has her grandchildren visiting during this outreach Depression screen [facility] 2/9 02/15/2021  Decreased Interest 0  Down, Depressed, Hopeless 0  PHQ - 2 Score 0  Some recent data might be hidden  No falls remains safe   Plans Patient agrees to care plan and follow up with in 30 business days related to her back concerns Pt encouraged to return a call to [facility] RN CM prn  Goals Addressed              This Visit's Progress     Patient Stated   .  [facility] ) Keep or Improve My Strength-Stroke (pt-stated)   On track     Timeframe:  Long-Range Goal Priority:  High Start Date:                   12/12/20          Expected End Date:             03/21/20          Follow  Up Date 03/15/21   - attend 66 percent of occupational therapy appointments - attend 90 percent of physical therapy appointments - increase activity or exercise time a little every week - plan activity for times when energy is the highest    Notes: 02/15/21 now walking with only cane, continue [facility] PT services but with back pain  01/14/21 She reports improvements with  Activities of daily living (ADLs) since the last 12/24/20 outreach as she continue to work with her home health therapists She reports being w/c bound but now walks without her walker at intervals She keeps the  walker to use if she gets fatigue. She reports primarily independence with dressing, bathing, cooking, getting out of bed, showering, dressing     .  ([facility]) Eat Healthy (pt-stated)   On track     Timeframe:  Short-Term Goal Priority:  Medium Start Date:             01/14/21                Expected End Date:         04/20/21              Follow Up Date 03/15/21   - set goal weight - set a realistic goal       Notes: 02/14/21 reports continue diet and weight management wt 234 lbs  01/14/21 She reports she would like to get to a 180-200 lbs or size 9. She reports wanting to lose at least 40 more pounds. She is trying to take in premier protein shakes    .  COMPLETED: [facility]) Manage My Medicine (pt-stated)   On track     Timeframe:  Short-Term Goal Priority:  High Start Date:                  12/24/20           Expected End Date:          02/18/21             Follow Up Date 02/15/21   - call for medicine refill 2 or 3 days before it runs out   Notes: 02/15/21   resolved  No noted medication cost, purchase concerns          Othell Jaime L. Lavina Hamman, RN, BSN, [place] Coordinator Office number [phone] Main [facility] number [phone] Fax number [phone]

## 2021-02-18 DIAGNOSIS — M47817 Spondylosis without myelopathy or radiculopathy, lumbosacral region: Secondary | ICD-10-CM | POA: Diagnosis not present

## 2021-02-21 ENCOUNTER — Other Ambulatory Visit: Payer: Self-pay

## 2021-02-21 DIAGNOSIS — I69392 Facial weakness following cerebral infarction: Secondary | ICD-10-CM | POA: Diagnosis not present

## 2021-02-21 DIAGNOSIS — I69328 Other speech and language deficits following cerebral infarction: Secondary | ICD-10-CM | POA: Diagnosis not present

## 2021-02-21 DIAGNOSIS — R1312 Dysphagia, oropharyngeal phase: Secondary | ICD-10-CM | POA: Diagnosis not present

## 2021-02-21 DIAGNOSIS — I69354 Hemiplegia and hemiparesis following cerebral infarction affecting left non-dominant side: Secondary | ICD-10-CM | POA: Diagnosis not present

## 2021-02-21 DIAGNOSIS — I69312 Visuospatial deficit and spatial neglect following cerebral infarction: Secondary | ICD-10-CM | POA: Diagnosis not present

## 2021-02-21 DIAGNOSIS — I69391 Dysphagia following cerebral infarction: Secondary | ICD-10-CM | POA: Diagnosis not present

## 2021-02-21 NOTE — Patient Outreach (Signed)
[facility]) Care Management  02/21/2021  Laura [name] 04/05/1951 [id number]   First telephone outreach attempt to obtain mRS. No answer. Left message for returned call.  Philmore Pali [facility] Management Assistant [phone]

## 2021-02-22 ENCOUNTER — Telehealth: Payer: Self-pay | Admitting: Cardiovascular Disease

## 2021-02-22 ENCOUNTER — Telehealth: Payer: Self-pay

## 2021-02-22 ENCOUNTER — Other Ambulatory Visit: Payer: Self-pay | Admitting: Nurse Practitioner

## 2021-02-22 DIAGNOSIS — J449 Chronic obstructive pulmonary disease, unspecified: Secondary | ICD-10-CM | POA: Diagnosis not present

## 2021-02-22 DIAGNOSIS — Z7951 Long term (current) use of inhaled steroids: Secondary | ICD-10-CM | POA: Diagnosis not present

## 2021-02-22 DIAGNOSIS — R1312 Dysphagia, oropharyngeal phase: Secondary | ICD-10-CM | POA: Diagnosis not present

## 2021-02-22 DIAGNOSIS — I110 Hypertensive heart disease with heart failure: Secondary | ICD-10-CM | POA: Diagnosis not present

## 2021-02-22 DIAGNOSIS — Z86718 Personal history of other venous thrombosis and embolism: Secondary | ICD-10-CM | POA: Diagnosis not present

## 2021-02-22 DIAGNOSIS — Z79891 Long term (current) use of opiate analgesic: Secondary | ICD-10-CM | POA: Diagnosis not present

## 2021-02-22 DIAGNOSIS — I69354 Hemiplegia and hemiparesis following cerebral infarction affecting left non-dominant side: Secondary | ICD-10-CM | POA: Diagnosis not present

## 2021-02-22 DIAGNOSIS — I5022 Chronic systolic (congestive) heart failure: Secondary | ICD-10-CM | POA: Diagnosis not present

## 2021-02-22 DIAGNOSIS — I69328 Other speech and language deficits following cerebral infarction: Secondary | ICD-10-CM | POA: Diagnosis not present

## 2021-02-22 DIAGNOSIS — I69312 Visuospatial deficit and spatial neglect following cerebral infarction: Secondary | ICD-10-CM | POA: Diagnosis not present

## 2021-02-22 DIAGNOSIS — I69391 Dysphagia following cerebral infarction: Secondary | ICD-10-CM | POA: Diagnosis not present

## 2021-02-22 DIAGNOSIS — Z9981 Dependence on supplemental oxygen: Secondary | ICD-10-CM | POA: Diagnosis not present

## 2021-02-22 DIAGNOSIS — I739 Peripheral vascular disease, unspecified: Secondary | ICD-10-CM | POA: Diagnosis not present

## 2021-02-22 DIAGNOSIS — K219 Gastro-esophageal reflux disease without esophagitis: Secondary | ICD-10-CM | POA: Diagnosis not present

## 2021-02-22 DIAGNOSIS — I272 Pulmonary hypertension, unspecified: Secondary | ICD-10-CM | POA: Diagnosis not present

## 2021-02-22 DIAGNOSIS — Z6841 Body Mass Index (BMI) 40.0 and over, adult: Secondary | ICD-10-CM | POA: Diagnosis not present

## 2021-02-22 DIAGNOSIS — I2510 Atherosclerotic heart disease of native coronary artery without angina pectoris: Secondary | ICD-10-CM | POA: Diagnosis not present

## 2021-02-22 DIAGNOSIS — E785 Hyperlipidemia, unspecified: Secondary | ICD-10-CM | POA: Diagnosis not present

## 2021-02-22 DIAGNOSIS — Z9181 History of falling: Secondary | ICD-10-CM | POA: Diagnosis not present

## 2021-02-22 DIAGNOSIS — I482 Chronic atrial fibrillation, unspecified: Secondary | ICD-10-CM

## 2021-02-22 DIAGNOSIS — F411 Generalized anxiety disorder: Secondary | ICD-10-CM | POA: Diagnosis not present

## 2021-02-22 DIAGNOSIS — I428 Other cardiomyopathies: Secondary | ICD-10-CM | POA: Diagnosis not present

## 2021-02-22 DIAGNOSIS — Z7901 Long term (current) use of anticoagulants: Secondary | ICD-10-CM | POA: Diagnosis not present

## 2021-02-22 DIAGNOSIS — I69392 Facial weakness following cerebral infarction: Secondary | ICD-10-CM | POA: Diagnosis not present

## 2021-02-22 NOTE — Telephone Encounter (Signed)
Spoke with patient and she reports she has been taking spironolactone 25 mg twice a day and ran out of her medication. She called her PCP who reports that this medication had been discontinued by [place]. She reports since running out of this medication her ankles have started swelling and she does not want to get in trouble with fluid buildup. Reviewed chart and I do not see where we discontinued this medication but she requests to stay on this because of the ankle swelling. I have reviewed her chart and not able to find orders to change or stop medication. Will route to provider for review and recommendations.

## 2021-02-22 NOTE — Telephone Encounter (Signed)
Spoke with patient and she states that back during that time she was told to stop Furosemide and stay on Spironolactone because she was not able to eat or drink and got sick. She reports her weights are staying steady now and no problems with dehydration. She states she is trying to lose weight and her weight has been staying steady. She states that her last dose was this morning of the spironolactone. Advised that before we can send in prescription we need to have repeat labs done at the [facility] entrance. She reports that she will go on Monday to have those done. Reviewed all information with her and she verbalized understanding with no further questions.

## 2021-02-22 NOTE — Telephone Encounter (Signed)
Pt c/o swelling: STAT is pt has developed SOB within 24 hours  1) How much weight have you gained and in what time span?   2) If swelling, where is the swelling located? ankles  3) Are you currently taking a fluid pill? spriolactone  4) Are you currently SOB? no  5) Do you have a log of your daily weights (if so, list)?   6) Have you gained 3 pounds in a day or 5 pounds in a week? no  7) Have you traveled recently? No  Patient is currently on a calorie based diet and been watching weight. No weight gain has been happening however she is swelling in ankles. Patient has been taking her spirolactone as prescribed and ran cout as of yesterday. Patients PCP told patient she was taken off spirolactone after her stroke. Patient wanting to know if she should be off this medication or if she needs it  Please advise

## 2021-02-22 NOTE — Telephone Encounter (Signed)
Pt called asked for refill on her spironolactone and per Lovena Le we advised for pt to call cardiology

## 2021-02-22 NOTE — Telephone Encounter (Signed)
Seen in ED 11/2020 with dehydration and acute kidney injury. At follow up virtual visit with me 11/2020 Spironolactone was removed from her medication list due to patient preference per documentation. She was likely told to hold by ED provider due to dehydration. She was seen by Dr. Fletcher Anon 01/2021 and we were not aware she was taking at that time.   If she wishes to resume Spironolacotne and have our office prescribe, we will need for her to get a BMP in the [place] to check her kidney function prior to initiating. Please also inquire when her last dose was taken. Also, if she has been weighing has she noticed any change?  Thanks,  Loel Dubonnet, NP

## 2021-02-25 NOTE — Telephone Encounter (Signed)
Spoke with patient and she reports some back pain and waiting until tomorrow to have labs done as her granddaughter will be able to bring her in. She had no further questions at this time.

## 2021-02-25 NOTE — Telephone Encounter (Signed)
Left voicemail message that I was calling back to see if she had gotten labs done at the [facility] and to call back.

## 2021-02-26 ENCOUNTER — Other Ambulatory Visit
Admission: RE | Admit: 2021-02-26 | Discharge: 2021-02-26 | Disposition: A | Discharge status: Home / Self Care | Payer: Medicare Other | Source: Ambulatory Visit | Attending: Cardiovascular Disease | Admitting: Cardiovascular Disease

## 2021-02-26 ENCOUNTER — Other Ambulatory Visit: Payer: Self-pay

## 2021-02-26 DIAGNOSIS — I10 Essential (primary) hypertension: Secondary | ICD-10-CM | POA: Diagnosis not present

## 2021-02-26 DIAGNOSIS — I482 Chronic atrial fibrillation, unspecified: Secondary | ICD-10-CM | POA: Diagnosis not present

## 2021-02-26 LAB — BASIC METABOLIC PANEL
Anion gap: 9 (ref 5–15)
BUN: 15 mg/dL (ref 8–23)
CO2: 25 mmol/L (ref 22–32)
Calcium: 9.5 mg/dL (ref 8.9–10.3)
Chloride: 103 mmol/L (ref 98–111)
Creatinine, Ser: 0.8 mg/dL (ref 0.44–1.00)
GFR, Estimated: >60 mL/min (ref >60)
Glucose, Bld: 112 mg/dL — ABNORMAL HIGH (ref 70–99)
Potassium: 4.8 mmol/L (ref 3.5–5.1)
Sodium: 137 mmol/L (ref 135–145)

## 2021-02-26 MED ORDER — SPIRONOLACTONE 25 MG PO TABS: 25.0000 mg | ORAL_TABLET | Freq: Every day | ORAL | 2 refills | Status: DC

## 2021-02-26 NOTE — Telephone Encounter (Signed)
Spoke with patient and reviewed lab results and recommendations to only take Spironolactone 25 mg ONCE per day with repeat labs in 2 weeks. Scheduled her to follow up for 03/12/21 with Dr. Fletcher Anon and reviewed that repeat labs could be done at that appointment. She verbalized understanding to only take Spironolactone 25 mg once daily and was appreciative for the call back.

## 2021-02-26 NOTE — Telephone Encounter (Signed)
Agree with plan. Thanks for coordinating.  Loel Dubonnet, NP

## 2021-02-26 NOTE — Addendum Note (Signed)
Addended by: Valora Corporal on: 02/26/2021 01:24 PM   Modules accepted: Orders

## 2021-02-26 NOTE — Telephone Encounter (Signed)
Lab work was ordered under Dr. Tyrell Antonio name. Her renal function was normal and potassium high normal. Okay to resume Spironolactone 25mg  ONCE per day. Please provide 30 day supply with 2 refills. Repeat BMP at [facility] in 2 weeks. She is due for follow up with Dr. Fletcher Anon 04/2021 and recommend she contact the office to schedule.   Loel Dubonnet, NP

## 2021-02-27 DIAGNOSIS — I69354 Hemiplegia and hemiparesis following cerebral infarction affecting left non-dominant side: Secondary | ICD-10-CM | POA: Diagnosis not present

## 2021-02-27 DIAGNOSIS — I69312 Visuospatial deficit and spatial neglect following cerebral infarction: Secondary | ICD-10-CM | POA: Diagnosis not present

## 2021-02-27 DIAGNOSIS — I69391 Dysphagia following cerebral infarction: Secondary | ICD-10-CM | POA: Diagnosis not present

## 2021-02-27 DIAGNOSIS — R1312 Dysphagia, oropharyngeal phase: Secondary | ICD-10-CM | POA: Diagnosis not present

## 2021-02-27 DIAGNOSIS — I69392 Facial weakness following cerebral infarction: Secondary | ICD-10-CM | POA: Diagnosis not present

## 2021-02-27 DIAGNOSIS — I69328 Other speech and language deficits following cerebral infarction: Secondary | ICD-10-CM | POA: Diagnosis not present

## 2021-03-04 ENCOUNTER — Other Ambulatory Visit: Payer: Self-pay

## 2021-03-04 ENCOUNTER — Telehealth: Payer: Self-pay

## 2021-03-04 MED ORDER — ACCU-CHEK GUIDE VI STRP: ORAL_STRIP | 12 refills | Status: DC

## 2021-03-04 NOTE — Patient Outreach (Signed)
[facility]) Care Management  03/04/2021  Laura [name] 07-20-1951 [id number]   Second telephone outreach attempt to obtain mRS. No answer. Left message for returned call.  [place] [facility] Management Assistant [phone]

## 2021-03-04 NOTE — Telephone Encounter (Signed)
Pt's husband came in and picked up a Accu-chek meter kit and sent prescription in to pharmacy for test strips 

## 2021-03-06 DIAGNOSIS — M479 Spondylosis, unspecified: Secondary | ICD-10-CM | POA: Diagnosis not present

## 2021-03-06 DIAGNOSIS — M62830 Muscle spasm of back: Secondary | ICD-10-CM | POA: Diagnosis not present

## 2021-03-06 DIAGNOSIS — M545 Low back pain, unspecified: Secondary | ICD-10-CM | POA: Diagnosis not present

## 2021-03-06 DIAGNOSIS — M5416 Radiculopathy, lumbar region: Secondary | ICD-10-CM | POA: Diagnosis not present

## 2021-03-08 ENCOUNTER — Other Ambulatory Visit: Payer: Self-pay

## 2021-03-08 DIAGNOSIS — I69392 Facial weakness following cerebral infarction: Secondary | ICD-10-CM | POA: Diagnosis not present

## 2021-03-08 DIAGNOSIS — I69328 Other speech and language deficits following cerebral infarction: Secondary | ICD-10-CM | POA: Diagnosis not present

## 2021-03-08 DIAGNOSIS — I69312 Visuospatial deficit and spatial neglect following cerebral infarction: Secondary | ICD-10-CM | POA: Diagnosis not present

## 2021-03-08 DIAGNOSIS — I69354 Hemiplegia and hemiparesis following cerebral infarction affecting left non-dominant side: Secondary | ICD-10-CM | POA: Diagnosis not present

## 2021-03-08 DIAGNOSIS — R1312 Dysphagia, oropharyngeal phase: Secondary | ICD-10-CM | POA: Diagnosis not present

## 2021-03-08 DIAGNOSIS — I69391 Dysphagia following cerebral infarction: Secondary | ICD-10-CM | POA: Diagnosis not present

## 2021-03-08 NOTE — Patient Outreach (Signed)
[facility]) Care Management  03/08/2021  Laura [name] 1951-09-30 [id number]   3 outreach attempts were completed to obtain mRs. mRs could not be obtained because patient never returned my calls. mRs=7    [place] Management Assistant [phone]

## 2021-03-12 ENCOUNTER — Ambulatory Visit: Payer: Medicare Other | Admitting: Cardiovascular Disease

## 2021-03-14 DIAGNOSIS — I69392 Facial weakness following cerebral infarction: Secondary | ICD-10-CM | POA: Diagnosis not present

## 2021-03-14 DIAGNOSIS — R1312 Dysphagia, oropharyngeal phase: Secondary | ICD-10-CM | POA: Diagnosis not present

## 2021-03-14 DIAGNOSIS — I69328 Other speech and language deficits following cerebral infarction: Secondary | ICD-10-CM | POA: Diagnosis not present

## 2021-03-14 DIAGNOSIS — I69312 Visuospatial deficit and spatial neglect following cerebral infarction: Secondary | ICD-10-CM | POA: Diagnosis not present

## 2021-03-14 DIAGNOSIS — I69391 Dysphagia following cerebral infarction: Secondary | ICD-10-CM | POA: Diagnosis not present

## 2021-03-14 DIAGNOSIS — I69354 Hemiplegia and hemiparesis following cerebral infarction affecting left non-dominant side: Secondary | ICD-10-CM | POA: Diagnosis not present

## 2021-03-15 ENCOUNTER — Other Ambulatory Visit: Payer: Self-pay

## 2021-03-15 ENCOUNTER — Other Ambulatory Visit: Payer: Self-pay | Admitting: *Deleted

## 2021-03-15 NOTE — Patient Outreach (Addendum)
[facility]) Care Management  03/15/2021  HAILA DENA 1951/07/17 [id number]  [facility] outreach to complex care patient referred by [facility] Mrs LADONNA VANORDER was referred to [facility] on 12/04/20 for EMMI stroke referral issues related questions/problems with medicines, lost interest in things they used to enjoy and symptoms of sad, hopeless, anxious, or empty was reported. After several unsuccessful outreaches, she returned a call on 12/12/20 to initiate EMMI Stroke services On 12/24/20 she reported further medication cost issues and was referred to [place] admission11/30/21-12/3/21 Acute R MCA ischemic stroke [facility]) s/p revascularization R M1, embolic d/t known AF 9/75/88-03/15/48 enteritis andAtrial fibrillationwith rapid ventricular response [employer]and [employer]   Successful outreach to home number [phone] Patient is able to verify HIPAA ([place] and [place]) identifiers Reviewed and addressed the purpose of the follow up call with the patient  Consent: [facility]) RN CM reviewed [place] with patient. Patient gave verbal consent for services.  Follow up assessment Doing okay, very well overall Back not hurting as much as Neurontin helps  Completed home health therapy as of 03/14/21 [facility] Bloodgood) and will receive outpatient at [facility] She has made an appointment Got shot from neurology recently  Pt wants to walk again Discussed nerve healing with electric stimulation she has TENS She check all vitals daily  Systolic BP 407 today the highest recalled since last outreach was 120  Lost 4 more lbs, weight now 224 lbs and her plan is to lose 2 lbs further this week Her goal is 200 lbs near July 2022  She has a fitness tracker She is taking in small meals spread out and balance over the day  Her family continues to be supportive  Her husband does laundry  Knee bothers her and she purchased  a brace from [employer] on 03/09/21  Although she has a handicap tub, she fell getting out of the tub after not locking her Wheelchair (W/C)   It was resolved with ice and tylenol  Has a scooter   Appointment-04/01/21 MD to be seen   Goal get in my car independently to go to the grocery store and the park plus she wants to go to church wearing a white easter dress/outfit   Plan [facility] RN CM will follow up with patient within the next 30-45 business days Pt encouraged to return a call to [place] CM prn Goals Addressed              This Visit's Progress     Patient Stated   .  COMPLETED: [facility] ) Keep or Improve My Strength-Stroke (pt-stated)   On track     Timeframe:  Long-Range Goal Priority:  High Start Date:                   12/12/20          Expected End Date:             03/21/21             - attend 90 percent of occupational therapy appointments - attend 90 percent of physical therapy appointments - increase activity or exercise time a little every week - plan activity for times when energy is the highest    Notes: 03/15/21 Goal met-Completed home health therapy as of 03/14/21 Nevin Bloodgood) and will receive outpatient at [facility] She has made an appointment 02/15/21 now walking with only cane, continue [facility] PT services but with  back pain  01/14/21 She reports improvements with  Activities of daily living (ADLs) since the last 12/24/20 outreach as she continue to work with her home health therapists She reports being w/c bound but now walks without her walker at intervals She keeps the walker to use if she gets fatigue. She reports primarily independence with dressing, bathing, cooking, getting out of bed, showering, dressing     .  ([facility]) Eat Healthy (pt-stated)   On track     Timeframe:  Short-Term Goal Priority:  Medium Start Date:             01/14/21                Expected End Date:         04/20/21              Follow Up Date 04/15/21   - set goal weight - set a realistic goal      Notes: 03/15/21 Lost 4 more lbs, weight now 224 lbs and her plan is to lose 2 lbs further this week Her goal is 200 lbs near July 2022  02/14/21 reports continue diet and weight management wt 234 lbs  01/14/21 She reports she would like to get to a 180-200 lbs or size 9. She reports wanting to lose at least 40 more pounds. She is trying to take in premier [employer] L. Lavina Hamman, RN, BSN, [place] Coordinator Office number [phone] [facility] number [phone] Fax number [phone]

## 2021-03-18 ENCOUNTER — Ambulatory Visit (INDEPENDENT_AMBULATORY_CARE_PROVIDER_SITE_OTHER): Payer: Medicare Other | Admitting: Family

## 2021-03-18 ENCOUNTER — Encounter: Payer: Self-pay | Admitting: Family

## 2021-03-18 ENCOUNTER — Other Ambulatory Visit: Payer: Self-pay

## 2021-03-18 VITALS — BP 108/60 | HR 78 | Ht 64.0 in | Wt 236.0 lb

## 2021-03-18 DIAGNOSIS — E785 Hyperlipidemia, unspecified: Secondary | ICD-10-CM | POA: Diagnosis not present

## 2021-03-18 DIAGNOSIS — I639 Cerebral infarction, unspecified: Secondary | ICD-10-CM

## 2021-03-18 DIAGNOSIS — I5020 Unspecified systolic (congestive) heart failure: Secondary | ICD-10-CM

## 2021-03-18 DIAGNOSIS — I482 Chronic atrial fibrillation, unspecified: Secondary | ICD-10-CM

## 2021-03-18 DIAGNOSIS — Z8673 Personal history of transient ischemic attack (TIA), and cerebral infarction without residual deficits: Secondary | ICD-10-CM

## 2021-03-18 DIAGNOSIS — R600 Localized edema: Secondary | ICD-10-CM | POA: Diagnosis not present

## 2021-03-18 DIAGNOSIS — Z7901 Long term (current) use of anticoagulants: Secondary | ICD-10-CM

## 2021-03-18 DIAGNOSIS — I10 Essential (primary) hypertension: Secondary | ICD-10-CM

## 2021-03-18 MED ORDER — FUROSEMIDE 20 MG PO TABS: 20.0000 mg | ORAL_TABLET | Freq: Every day | ORAL | 5 refills | Status: DC

## 2021-03-18 NOTE — Patient Instructions (Addendum)
Medication Instructions:  Your physician has recommended you make the following change in your medication:   START Furosemide (Lasix) 67m daily  *If you need a refill on your cardiac medications before your next appointment, please call your pharmacy*   Lab Work: Your physician recommends that you have lab work today:  CMP, lipid panel  If you have labs (blood work) drawn today and your tests are completely normal, you will receive your results only by: .Marland KitchenMyChart Message (if you have MyChart) OR . A paper copy in the mail If you have any lab test that is abnormal or we need to change your treatment, we will call you to review the results.   Testing/Procedures: Your physician has requested that you have a lower extremity venous- ASAP duplex. This test is an ultrasound of the veins in the legs. It looks at venous blood flow that carries blood from the heart to the legs. Allow one hour for a Lower Venous exam. There are no restrictions or special instructions.    Follow-Up: [facility] you and your health needs are our priority.  As part of our continuing mission to provide you with exceptional heart care, we have created designated Provider Care Teams.  These Care Teams include your primary Cardiologist (physician) and Advanced Practice Providers (APPs -  Physician Assistants and Nurse Practitioners) who all work together to provide you with the care you need, when you need it.  We recommend signing up for the patient portal called "MyChart".  Sign up information is provided on this After Visit Summary.  MyChart is used to connect with patients for Virtual Visits (Telemedicine).  Patients are able to view lab/test results, encounter notes, upcoming appointments, etc.  Non-urgent messages can be sent to your provider as well.   To learn more about what you can do with MyChart, go to [email or web address]    Your next appointment:   3 month(s)  The format for your next  appointment:   In Person  Provider:   You may see MKathlyn Sacramento MD or one of the following Advanced Practice Providers on your designated Care Team:    CMurray Hodgkins NP  RChristell Faith PA-C  JMarrianne Mood PA-C  Cadence FKathlen Mody PVermont CLaurann Montana NP  Other Instructions   Heart Healthy Diet Recommendations: A low-salt diet is recommended. Meats should be grilled, baked, or boiled. Avoid fried foods. Focus on lean protein sources like fish or chicken with vegetables and fruits. The American Heart Association is a GMicrobiologist  American Heart Association Diet and Lifeystyle Recommendations The American Heart Association also has great recipes.   Exercise recommendations: The American Heart Association recommends 150 minutes of moderate intensity exercise weekly. Try 30 minutes of moderate intensity exercise 4-5 times per week. This could include walking, jogging, or swimming.    Furosemide Oral Tablets What is this medicine? FUROSEMIDE (fyoor [place] se mide) is a diuretic. It helps you make more urine and to lose salt and excess water from your body. It treats swelling from heart, kidney, or liver disease. It also treats high blood pressure. This medicine may be used for other purposes; ask your health care provider or pharmacist if you have questions. COMMON BRAND NAME(S): Active-Medicated Specimen Kit, Delone, Diuscreen, Lasix, RX Specimen Collection Kit, Specimen Collection Kit, URINX Medicated Specimen Collection What should I tell my health care provider before I take this medicine? They need to know if you have any of these conditions:  abnormal blood electrolytes  diarrhea or vomiting  gout  heart disease  kidney disease, small amounts of urine, or difficulty passing urine  liver disease  thyroid disease  an unusual or allergic reaction to furosemide, sulfa drugs, other medicines, foods, dyes, or preservatives  pregnant or trying to get  pregnant  breast-feeding How should I use this medicine? Take this drug by mouth. Take it as directed on the prescription label at the same time every day. You can take it with or without food. If it upsets your stomach, take it with food. Keep taking it unless your health care provider tells you to stop. Talk to your health care provider about the use of this drug in children. Special care may be needed. Overdosage: If you think you have taken too much of this medicine contact a poison control center or emergency room at once. NOTE: This medicine is only for you. Do not share this medicine with others. What if I miss a dose? If you miss a dose, take it as soon as you can. If it is almost time for your next dose, take only that dose. Do not take double or extra doses. What may interact with this medicine?  aspirin and aspirin-like medicines  certain antibiotics  chloral hydrate  cisplatin  cyclosporine  digoxin  diuretics  laxatives  lithium  medicines for blood pressure  medicines that relax muscles for surgery  methotrexate  NSAIDs, medicines for pain and inflammation like ibuprofen, naproxen, or indomethacin  phenytoin  steroid medicines like prednisone or cortisone  sucralfate  thyroid hormones This list may not describe all possible interactions. Give your health care provider a list of all the medicines, herbs, non-prescription drugs, or dietary supplements you use. Also tell them if you smoke, drink alcohol, or use illegal drugs. Some items may interact with your medicine. What should I watch for while using this medicine? Visit your doctor or health care providerfor regular checks on your progress. Check your blood pressure regularly. Ask your doctor or health care providerwhat your blood pressure should be, and when you should contact him or her. If you are a diabetic, check your blood sugar as directed. This medicine may cause serious skin reactions. They can  happen weeks to months after starting the medicine. Contact your health care provider right away if you notice fevers or flu-like symptoms with a rash. The rash may be red or purple and then turn into blisters or peeling of the skin. Or, you might notice a red rash with swelling of the face, lips or lymph nodes in your neck or under your arms. You may need to be on a special diet while taking this medicine. Check with your doctor. Also, ask how many glasses of fluid you need to drink a day. You must not get dehydrated. You may get drowsy or dizzy. Do not drive, use machinery, or do anything that needs mental alertness until you know how this drug affects you. Do not stand or sit up quickly, especially if you are an older patient. This reduces the risk of dizzy or fainting spells. Alcohol can make you more drowsy and dizzy. Avoid alcoholic drinks. This medicine can make you more sensitive to the sun. Keep out of the sun. If you cannot avoid being in the sun, wear protective clothing and use sunscreen. Do not use sun lamps or tanning beds/booths. What side effects may I notice from receiving this medicine? Side effects that you should report to your doctor or health care professional  as soon as possible:  blood in urine or stools  dry mouth  fever or chills  hearing loss or ringing in the ears  irregular heartbeat  muscle pain or weakness, cramps  rash, fever, and swollen lymph nodes  redness, blistering, peeling or loosening of the skin, including inside the mouth  skin rash  stomach upset, pain, or nausea  tingling or numbness in the hands or feet  unusually weak or tired  vomiting or diarrhea  yellowing of the eyes or skin Side effects that usually do not require medical attention (report to your doctor or health care professional if they continue or are bothersome):  headache  loss of appetite  unusual bleeding or bruising This list may not describe all possible side  effects. Call your doctor for medical advice about side effects. You may report side effects to FDA at 1-800-FDA-1088. Where should I keep my medicine? Keep out of the reach of children and pets. Store at room temperature between 20 and 25 degrees C (68 and 77 degrees F). Protect from light and moisture. Keep the container tightly closed. Throw away any unused drug after the expiration date. NOTE: This sheet is a summary. It may not cover all possible information. If you have questions about this medicine, talk to your doctor, pharmacist, or health care provider.  2021 Elsevier/Gold Standard (2019-07-26 18:01:32)

## 2021-03-18 NOTE — Progress Notes (Signed)
Office Visit    Patient Name: Laura [name] Date of Encounter: 03/18/2021  PCP:  [name], MD   [place]  Cardiologist:  Kathlyn Sacramento, MD  Advanced Practice Provider:  No care team member to display Electrophysiologist:  None   Chief Complaint    Laura [name] is a 70 y.o. female with a hx of chronic atrial fibrillation, obesity, OSA on CPAP, asthma, essential hypertension, HF with midrange EF, NICM, HLD, obesity, COPD, GERD, nonobstructive CAD  presents today for LLE edema.   Past Medical History    Past Medical History:  Diagnosis Date  . Asthma   . Chronic atrial fibrillation ([place])    a. diagnosed in 09/2016; b. failed flecainide and propafenone due to LE swelling and SOB, could not afford Multaq; c. CHADS2VASc => 5 (CHF, HTN, age x 1, nonobs CAD, female)--> Eliquis  . COPD (chronic obstructive pulmonary disease) ([place])   . GERD (gastroesophageal reflux disease)   . HFmrEF (heart failure with mid-range ejection fraction) ([place])    a. 12/2019 Echo: EF 40-45%.  . Hyperlipidemia   . Hypertension   . NICM (nonischemic cardiomyopathy) ([place])    a. 12/2019 Echo: EF 40-45%, glob HK, mildly reduced RV fxn, sev dil LA. *HR 130 (afib) during study.  . Nonobstructive CAD (coronary artery disease)    a. Lexiscan Myoview 10/2016: no evidence of ischemia, EF 53%; b. 02/2020 Cath: LM nl, LAD 20p, 10m, LCX 20ost, OM1/2/3 nl, LPDA nl, LPL1/2 nl, LPAV nl, RCA small, nl.  . Obesity   . Obstructive sleep apnea   . Pulmonary hypertension ([place])   . Sleep apnea   . Stroke ([place])   . Systolic dysfunction    a. TTE 10/2016: EF 50%, mild LVH, moderately dilated LA, moderate MR/TR, mild pulmonary hypertension   Past Surgical History:  Procedure Laterality Date  . BOWEL RESECTION  09/11/2019   Procedure: SMALL BOWEL RESECTION;  Surgeon: Herbert Pun, MD;  Location: [facility] ORS;  Service: General;;  . CARDIAC CATHETERIZATION    . cataract surgery    .  COLONOSCOPY WITH PROPOFOL N/A 03/19/2020   Procedure: COLONOSCOPY WITH PROPOFOL;  Surgeon: [name], MD;  Location: [facility] ENDOSCOPY;  Service: Gastroenterology;  Laterality: N/A;  . CORONARY ANGIOPLASTY    . INCISION AND DRAINAGE ABSCESS Right 06/29/2016   Procedure: INCISION AND DRAINAGE ABSCESS;  Surgeon: Florene Glen, MD;  Location: [facility] ORS;  Service: General;  Laterality: Right;  . INCISION AND DRAINAGE OF WOUND Left 06/29/2016   Procedure: IRRIGATION AND DEBRIDEMENT WOUND;  Surgeon: Florene Glen, MD;  Location: [facility] ORS;  Service: General;  Laterality: Left;  . IR ANGIO VERTEBRAL SEL SUBCLAVIAN INNOMINATE UNI R MOD SED  11/20/2020  . IR CT HEAD LTD  11/20/2020  . IR PERCUTANEOUS ART THROMBECTOMY/INFUSION INTRACRANIAL INC DIAG ANGIO  11/20/2020  . LAPAROSCOPIC RIGHT COLECTOMY  09/11/2019   Procedure: RIGHT COLECTOMY;  Surgeon: Herbert Pun, MD;  Location: [facility] ORS;  Service: General;;  . LAPAROSCOPY N/A 09/11/2019   Procedure: LAPAROSCOPY DIAGNOSTIC;  Surgeon: Herbert Pun, MD;  Location: [facility] ORS;  Service: General;  Laterality: N/A;  . LAPAROTOMY N/A 09/13/2019   Procedure: REOPENING OF RECENT LAPAROTOMYANASTOMOSIS OF BOWEL;  Surgeon: Herbert Pun, MD;  Location: [facility] ORS;  Service: General;  Laterality: N/A;  . RADIOLOGY WITH ANESTHESIA N/A 11/20/2020   Procedure: IR WITH ANESTHESIA - CODE STROKE;  Surgeon: Radiologist, Medication, MD;  Location: [place];  Service: Radiology;  Laterality: N/A;  .  RIGHT/LEFT HEART CATH AND CORONARY ANGIOGRAPHY Bilateral 02/27/2020   Procedure: RIGHT/LEFT HEART CATH AND CORONARY ANGIOGRAPHY;  Surgeon: Wellington Hampshire, MD;  Location: [place] CV LAB;  Service: Cardiovascular;  Laterality: Bilateral;  . VISCERAL ANGIOGRAPHY N/A 09/12/2019   Procedure: VISCERAL ANGIOGRAPHY;  Surgeon: Algernon Huxley, MD;  Location: [place] CV LAB;  Service: Cardiovascular;  Laterality: N/A;    Allergies  Allergies  Allergen Reactions   . Flecainide Shortness Of Breath  . Metoprolol Shortness Of Breath, Swelling and Other (See Comments)    "My limbs swell" also  . Propafenone Shortness Of Breath, Swelling and Other (See Comments)    "My limbs swell" also  . Rivaroxaban Swelling and Other (See Comments)    Xarelto- "My limbs swell"    History of Present Illness    Laura [name] is a 70 y.o. female with a hx of chronic atrial fibrillation, obesity, OSA on CPAP, asthma, essential hypertension, HF with midrange EF, NICM, HLD, obesity, COPD, GERD, nonobstructive CAD last seen 02/14/2021 by Dr. Fletcher Anon.  Previous cardiac workup 2017 with pharmacologic nuclear stress test with no evidence of ischemia and EF 53%. Echo at that time with mildly reduced LVEF 50%, mild LVH, moderately dilated LA, moderate MR and TR with mild pulmonary hypertension.    Her atrial fibrillation is rate controlled as she was intolerant of multiple arrhythmics - previously failing flecainide and propafenone.  She has been previously intolerant of beta-blocker due to her lung disease. Also with trouble with Metoprolol and Rivaroxaban and as a result has been rate controlled on diltiazem and digoxin with Eliquis for anticoagulation.     Echo January 2021 with LVEF 40-45% and dilataed IVC. Subsequent CT chest dilated pulmonary artery suggestive of pulmonary hypertension. Underwent R/LHC 02/27/20 with mild nonobstructive disease, pulmonary hypertension related to left sided heart failure, wedge of 24 and PA of 43/24 (30). In follow up 03/20/20 Lasix was continued as renal function and electrolytes were stable. Lipid panel LDL 103 with Crestor 20mg  daily added.     Seen in clinic 05/10/20 doing well. Repeat lab work 05/16/20 with LDL 41. 06/01/20 she was transitioned to Warfarin due to cost, but her children then offered to pay for Eliquis and it was continued.   Seen in clinic 09/27/20 with 3 day history of nausea and lightheadedness. Her digoxin level was unremarkable.  She was noted to have GI upset and encouraged to follow up with her PCP. She was hospitalized 10/2020 with acute right MCA stroke in setting of being off anticoagulation for lumbar injection. Repeat echo 11/2020 during admission LVEF 45-50%, RVSF mildly reduced, RV mildly enlarged, RA pressure 35mmHg. She had COVID19 11/2020 but recovered. When seen by Dr. Fletcher Anon 02/14/21 she was doing overall well - her digoxin was discontinued due to renal dysfunction and adequate rate control.  She presents today for follow up.  She reports 2-week history of left lower extremity edema.  Predominantly in her foot and ankle.  Tells me she has been elevating her legs without improvement.  She wonders whether she needs to be back on furosemide.  Continues to work with physical therapy after her stroke.  Reports she is overall pleased with her progress and has been very carefully changing her diet, watching her sodium intake.  Her goal is to be down to 200 pounds by July.  She is attempting to lose 2 pounds per week.  Reports stable dyspnea on exertion which is overall improving.  Reports no chest pain, pressure,  or tightness. No  orthopnea, PND. Reports no palpitations.   EKGs/Labs/Other Studies Reviewed:   The following studies were reviewed today: Echo 11/2020  1. Left ventricular ejection fraction, by estimation, is 45 to 50%. The  left ventricle has mildly decreased function. Left ventricular endocardial  border not optimally defined to evaluate regional wall motion. Left  ventricular diastolic parameters are  indeterminate.   2. Right ventricular systolic function is mildly reduced. The right  ventricular size is mildly enlarged.   3. The mitral valve is grossly normal. No evidence of mitral valve  regurgitation.   4. The aortic valve was not well visualized. Aortic valve regurgitation  is not visualized.   5. The inferior vena cava is normal in size with <50% respiratory  variability, suggesting right atrial  pressure of 8 mmHg.   Cath 02/27/20  Ost Cx lesion is 20% stenosed.  Prox LAD lesion is 20% stenosed.  Prox LAD to Mid LAD lesion is 30% stenosed.   1.  Mild nonobstructive coronary artery disease.  Left dominant coronary arteries. 2.  Left ventricular angiography was not performed.  EF was mildly reduced by echo. 3.  Right heart catheterization showed moderately elevated filling pressures with an RA of 15 mmHg, pulmonary capillary wedge pressure of 24 mmHg, PA pressure of 43/24 with a mean of 30 mmHg.  Normal cardiac output at 5.18 with a cardiac index of 2.34.  Pulmonary vascular resistance was only 1.16 [place] units.   Recommendations: No significant coronary artery disease to require revascularization. Pulmonary hypertension seems to be all related to left sided heart failure.  The patient is volume overloaded.  She used to be on 40 mg of furosemide but that was discontinued when she was having diarrhea.  I am going to resume furosemide at the lower dose of 20 mg daily.   Echo 01/19/20  1. Left ventricular ejection fraction, by visual estimation, is 40 to  45%. The left ventricle has mild to moderately decreased function. There  is no left ventricular hypertrophy.   2. Global right ventricle has mildly reduced systolic function.The right  ventricular size is mildly enlarged. No increase in right ventricular wall  thickness.   3. Left ventricular diastolic parameters are indeterminate.   4. The left ventricle demonstrates global hypokinesis.   5. Left atrial size was severely dilated.   6. The inferior vena cava is dilated in size with <50% respiratory  variability, suggesting right atrial pressure of 15 mmHg.   7. Rhythm is atrial fibrillation, rate 130 bpm.   EKG: No EKG today  Recent Labs: 12/01/2020: ALT 14; Hemoglobin 15.7; Platelets 379 02/26/2021: BUN 15; Creatinine, Ser 0.80; Potassium 4.8; Sodium 137  Recent Lipid Panel    Component Value Date/Time   CHOL 151 11/21/2020  0616   CHOL 196 03/20/2020 0830   TRIG 125 11/21/2020 0616   HDL 46 11/21/2020 0616   HDL 68 03/20/2020 0830   CHOLHDL 3.3 11/21/2020 0616   VLDL 25 11/21/2020 0616   LDLCALC 80 11/21/2020 0616   LDLCALC 103 (H) 03/20/2020 0830   Risk Assessment/Calculations:   CHA2DS2-VASc Score = 7  This indicates a 11.2% annual risk of stroke. The patient's score is based upon: CHF History: Yes HTN History: Yes Diabetes History: No Stroke History: Yes Vascular Disease History: Yes Age Score: 1 Gender Score: 1    Home Medications   Current Meds  Medication Sig  . Accu-Chek Softclix Lancets lancets Use as instructed to check blood sugars daily 2  hours after meal  . Acetaminophen-Codeine 300-30 MG tablet Take 1 tablet by mouth every 6 (six) hours.  Marland Kitchen albuterol (VENTOLIN HFA) 108 (90 Base) MCG/ACT inhaler Inhale 2 puffs into the lungs every 4 (four) hours as needed for wheezing or shortness of breath.  . ALPRAZolam (XANAX) 0.25 MG tablet Take 0.25 mg by mouth 2 (two) times daily as needed for anxiety.  Marland Kitchen apixaban (ELIQUIS) 5 MG TABS tablet Take 1 tablet (5 mg total) by mouth 2 (two) times daily.  . baclofen (LIORESAL) 10 MG tablet Take 10 mg by mouth 3 (three) times daily as needed.  . benzonatate (TESSALON) 100 MG capsule Take 1 capsule (100 mg total) by mouth 2 (two) times daily as needed for cough.  . diltiazem (CARDIZEM CD) 360 MG 24 hr capsule Take 1 capsule (360 mg total) by mouth daily.  Marland Kitchen EPINEPHrine 0.3 mg/0.3 mL IJ SOAJ injection Inject 0.3 mg into the muscle as needed for anaphylaxis.   . fluticasone (FLONASE) 50 MCG/ACT nasal spray Place into both nostrils daily.  . furosemide (LASIX) 20 MG tablet Take 1 tablet (20 mg total) by mouth daily.  Marland Kitchen gabapentin (NEURONTIN) 300 MG capsule Take by mouth.  Marland Kitchen glucose blood (ACCU-CHEK GUIDE) test strip Use as instructed to check blood sugars daily 2 hours after meal  . ipratropium-albuterol (DUONEB) 0.5-2.5 (3) MG/3ML SOLN Take 3 mLs by  nebulization every 4 (four) hours as needed.  . loperamide (IMODIUM) 2 MG capsule Take 4 mg by mouth as needed for diarrhea or loose stools.  Marland Kitchen losartan (COZAAR) 25 MG tablet Take 1 tablet (25 mg total) by mouth daily.  . Melatonin 10 MG TABS Take 10 mg by mouth at bedtime as needed.  . montelukast (SINGULAIR) 10 MG tablet Take 1 tablet (10 mg total) by mouth daily.  Marland Kitchen omeprazole (PRILOSEC) 40 MG capsule TAKE 1 CAPSULE BY MOUTH EVERY DAY  . ondansetron (ZOFRAN) 4 MG tablet Take 1 tablet (4 mg total) by mouth every 8 (eight) hours as needed for nausea or vomiting.  . predniSONE (DELTASONE) 10 MG tablet TAKE 1 TABLET (10 MG TOTAL) BY MOUTH DAILY WITH BREAKFAST.  Marland Kitchen Probiotic Product (PROBIOTIC-10) CAPS Take 1 capsule by mouth daily.   . promethazine (PHENERGAN) 12.5 MG tablet TAKE 1 TABLET (12.5 MG TOTAL) BY MOUTH EVERY 12 (TWELVE) HOURS AS NEEDED FOR NAUSEA OR VOMITING.  . rosuvastatin (CRESTOR) 20 MG tablet Take 1 tablet (20 mg total) by mouth daily.  Marland Kitchen spironolactone (ALDACTONE) 25 MG tablet Take 1 tablet (25 mg total) by mouth daily.  . theophylline (THEO-24) 100 MG 24 hr capsule Take 1 capsule (100 mg total) by mouth daily.  . traMADol (ULTRAM) 50 MG tablet Take 50 mg by mouth every 6 (six) hours as needed for moderate pain.      Review of Systems  All other systems reviewed and are otherwise negative except as noted above.  Physical Exam    VS:  BP 108/60 (BP Location: Right Arm, Patient Position: Sitting, Cuff Size: Large)   Pulse 78   Ht 5\' 4"  (1.626 m)   Wt 236 lb (107 kg)   LMP  (LMP Unknown)   SpO2 97%   BMI 40.51 kg/m  , BMI Body mass index is 40.51 kg/m.  Wt Readings from Last 3 Encounters:  03/18/21 236 lb (107 kg)  02/15/21 234 lb (106.1 kg)  02/14/21 234 lb (106.1 kg)    GEN: Well nourished, overweight, well developed, in no acute distress. HEENT: normal.  Neck: Supple, no JVD, carotid bruits, or masses. Cardiac: Irregularly irregular, no murmurs, rubs, or gallops.  No clubbing, cyanosis.  Radials/PT 2+ and equal bilaterally.  RLE nonpitting pedal edema.  LLE with 2-3+ pitting pedal edema. Respiratory:  Respirations regular and unlabored, clear to auscultation bilaterally. GI: Soft, nontender, nondistended. MS: No deformity or atrophy. Skin: Warm and dry, no rash.  Varicose veins noted to bilateral lower extremities. Neuro:  Strength and sensation are intact. Psych: Normal affect.  Assessment & Plan    1. CAD- Reports no anginal symptoms.  No indication for ischemic evaluation at this time.  GDMT includes rosuvastatin.  Previous intolerance of beta blockers and no aspirin secondary to chronic anticoagulation.  2. Chronic atrial fibrillation/chronic anticoagulation- Rate controlled.  Continue diltiazem 360 mg daily.  Diltiazem previously discontinued due to renal dysfunction and her rate remains well controlled.  Denies bleeding complications on Eliquis, continue Eliquis 5 mg twice daily.  Does not meet dose reduction parameters.   3. OSA- Continue CPAP.  4. HLD, LDL goal less than 70- 11/2020 LDL 80.  Update CMP, lipid panel today.  If LDL remains above goal consider increased dose of Crestor versus addition of Zetia.  5. Hypertension- BP well controlled. Continue current antihypertensive regimen.   6. Chronic systolic heart failure / LE edema-LLE with 2-3+ pitting edema.  Onset 2 to 3 weeks ago.  No signs of infection, low suspicion for cellulitis.  Plan for LLE venous duplex to rule out DVT.  Discussed that her LE edema could also be related to venous insufficiency, high dose gabapentin. Echo 11/2020 LVEF 45-50%. Start Lasix 20 mg daily.  CMP today for monitoring of renal function. Her GDMT includes Losartan, Spironolactone, Lasix.   7. History of CVA - In setting of being off Eliquis. Future interruption of Eliquis will requiring bridging with low molecular weight heparin.  She was provided a handout today on Lovenox for future reference, tells me she  is comfortable with injections, verbalized understanding that requesting party will need to contact our office for recommendations regarding holding Eliquis in the future.   Disposition: Follow up in 3 month(s) with Dr. Fletcher Anon or APP   Signed, Loel Dubonnet, NP 03/18/2021, 9:38 AM [place]

## 2021-03-19 ENCOUNTER — Ambulatory Visit (INDEPENDENT_AMBULATORY_CARE_PROVIDER_SITE_OTHER): Payer: Medicare Other

## 2021-03-19 DIAGNOSIS — I69391 Dysphagia following cerebral infarction: Secondary | ICD-10-CM | POA: Diagnosis not present

## 2021-03-19 DIAGNOSIS — I69354 Hemiplegia and hemiparesis following cerebral infarction affecting left non-dominant side: Secondary | ICD-10-CM | POA: Diagnosis not present

## 2021-03-19 DIAGNOSIS — R1312 Dysphagia, oropharyngeal phase: Secondary | ICD-10-CM | POA: Diagnosis not present

## 2021-03-19 DIAGNOSIS — R600 Localized edema: Secondary | ICD-10-CM | POA: Diagnosis not present

## 2021-03-19 DIAGNOSIS — I69392 Facial weakness following cerebral infarction: Secondary | ICD-10-CM | POA: Diagnosis not present

## 2021-03-19 DIAGNOSIS — I69312 Visuospatial deficit and spatial neglect following cerebral infarction: Secondary | ICD-10-CM | POA: Diagnosis not present

## 2021-03-19 DIAGNOSIS — I69328 Other speech and language deficits following cerebral infarction: Secondary | ICD-10-CM | POA: Diagnosis not present

## 2021-03-19 LAB — COMPREHENSIVE METABOLIC PANEL
ALT: 22 IU/L (ref 0–32)
AST: 20 IU/L (ref 0–40)
Albumin/Globulin Ratio: 2 (ref 1.2–2.2)
Albumin: 4.3 g/dL (ref 3.8–4.8)
Alkaline Phosphatase: 70 IU/L (ref 44–121)
BUN/Creatinine Ratio: 23 (ref 12–28)
BUN: 16 mg/dL (ref 8–27)
Bilirubin Total: 0.7 mg/dL (ref 0.0–1.2)
CO2: 23 mmol/L (ref 20–29)
Calcium: 9.1 mg/dL (ref 8.7–10.3)
Chloride: 100 mmol/L (ref 96–106)
Creatinine, Ser: 0.69 mg/dL (ref 0.57–1.00)
Globulin, Total: 2.2 g/dL (ref 1.5–4.5)
Glucose: 90 mg/dL (ref 65–99)
Potassium: 4.3 mmol/L (ref 3.5–5.2)
Sodium: 138 mmol/L (ref 134–144)
Total Protein: 6.5 g/dL (ref 6.0–8.5)
eGFR: 93 mL/min/{1.73_m2} (ref >59)

## 2021-03-19 LAB — LIPID PANEL
Chol/HDL Ratio: 2 ratio (ref 0.0–4.4)
Cholesterol, Total: 129 mg/dL (ref 100–199)
HDL: 65 mg/dL (ref >39)
LDL Chol Calc (NIH): 48 mg/dL (ref 0–99)
Triglycerides: 86 mg/dL (ref 0–149)
VLDL Cholesterol Cal: 16 mg/dL (ref 5–40)

## 2021-03-22 DIAGNOSIS — R293 Abnormal posture: Secondary | ICD-10-CM | POA: Diagnosis not present

## 2021-03-22 DIAGNOSIS — M5459 Other low back pain: Secondary | ICD-10-CM | POA: Diagnosis not present

## 2021-03-25 ENCOUNTER — Ambulatory Visit (INDEPENDENT_AMBULATORY_CARE_PROVIDER_SITE_OTHER): Payer: Medicare Other | Admitting: Hospice and Palliative Medicine

## 2021-03-25 ENCOUNTER — Other Ambulatory Visit: Payer: Self-pay

## 2021-03-25 ENCOUNTER — Encounter: Payer: Self-pay | Admitting: Hospice and Palliative Medicine

## 2021-03-25 VITALS — BP 140/70 | HR 72 | Temp 97.5°F | Resp 16 | Ht 64.0 in | Wt 235.6 lb

## 2021-03-25 DIAGNOSIS — I5022 Chronic systolic (congestive) heart failure: Secondary | ICD-10-CM | POA: Diagnosis not present

## 2021-03-25 DIAGNOSIS — I6930 Unspecified sequelae of cerebral infarction: Secondary | ICD-10-CM | POA: Diagnosis not present

## 2021-03-25 DIAGNOSIS — I10 Essential (primary) hypertension: Secondary | ICD-10-CM

## 2021-03-25 NOTE — Progress Notes (Signed)
[facility] [place], [place] [address]  Internal MEDICINE  Office Visit Note  Patient Name: Laura [name]  [id number]  [id number]  Date of Service: 03/26/2021  Chief Complaint  Patient presents with  . Follow-up    Refill request, left leg swollen   . Gastroesophageal Reflux  . Sleep Apnea  . Hypertension  . Hyperlipidemia  . COPD  . Asthma  . Quality Metric Gaps    Mammogram, dexa    HPI Patient is here for routine follow-up Continues to show significant improvement since her recent CVA Only occasionally requiring ambulation assistance with her cane--able to walk and move around her home with any assistance Continues to work with outpatient PT and OT  Breathing has significantly improved as well, no longer feeling short of breath or coughing Has regained quite a but of her strength back to her left side affected by the stroke, cooks and cleans at home--still unable to drive but feels she will be able to again soon  Planning to have spinal injections for chronic back pain--has already spoken to her cardiologist and will be switched to SQ Lovenox injections from Eliquis temporarily to be able to safely receiving spinal injections  Has been working on her diet and has lost 9 pounds since our last visit in December  Current Medication: Outpatient Encounter Medications as of 03/25/2021  Medication Sig  . Accu-Chek Softclix Lancets lancets Use as instructed to check blood sugars daily 2 hours after meal  . Acetaminophen-Codeine 300-30 MG tablet Take 1 tablet by mouth every 6 (six) hours.  Marland Kitchen albuterol (VENTOLIN HFA) 108 (90 Base) MCG/ACT inhaler Inhale 2 puffs into the lungs every 4 (four) hours as needed for wheezing or shortness of breath.  . ALPRAZolam (XANAX) 0.25 MG tablet Take 0.25 mg by mouth 2 (two) times daily as needed for anxiety.  Marland Kitchen apixaban (ELIQUIS) 5 MG TABS tablet Take 1 tablet (5 mg total) by mouth 2 (two) times daily.  . baclofen  (LIORESAL) 10 MG tablet Take 10 mg by mouth 3 (three) times daily as needed.  . benzonatate (TESSALON) 100 MG capsule Take 1 capsule (100 mg total) by mouth 2 (two) times daily as needed for cough.  . diltiazem (CARDIZEM CD) 360 MG 24 hr capsule Take 1 capsule (360 mg total) by mouth daily.  Marland Kitchen EPINEPHrine 0.3 mg/0.3 mL IJ SOAJ injection Inject 0.3 mg into the muscle as needed for anaphylaxis.   . fluticasone (FLONASE) 50 MCG/ACT nasal spray Place into both nostrils daily.  . furosemide (LASIX) 20 MG tablet Take 1 tablet (20 mg total) by mouth daily.  Marland Kitchen gabapentin (NEURONTIN) 300 MG capsule Take by mouth.  Marland Kitchen glucose blood (ACCU-CHEK GUIDE) test strip Use as instructed to check blood sugars daily 2 hours after meal  . ipratropium-albuterol (DUONEB) 0.5-2.5 (3) MG/3ML SOLN Take 3 mLs by nebulization every 4 (four) hours as needed.  . loperamide (IMODIUM) 2 MG capsule Take 4 mg by mouth as needed for diarrhea or loose stools.  Marland Kitchen losartan (COZAAR) 25 MG tablet Take 1 tablet (25 mg total) by mouth daily.  . Melatonin 10 MG TABS Take 10 mg by mouth at bedtime as needed.  . montelukast (SINGULAIR) 10 MG tablet Take 1 tablet (10 mg total) by mouth daily.  Marland Kitchen omeprazole (PRILOSEC) 40 MG capsule TAKE 1 CAPSULE BY MOUTH EVERY DAY  . ondansetron (ZOFRAN) 4 MG tablet Take 1 tablet (4 mg total) by mouth every 8 (eight) hours as needed  for nausea or vomiting.  . predniSONE (DELTASONE) 10 MG tablet TAKE 1 TABLET (10 MG TOTAL) BY MOUTH DAILY WITH BREAKFAST.  Marland Kitchen Probiotic Product (PROBIOTIC-10) CAPS Take 1 capsule by mouth daily.   . promethazine (PHENERGAN) 12.5 MG tablet TAKE 1 TABLET (12.5 MG TOTAL) BY MOUTH EVERY 12 (TWELVE) HOURS AS NEEDED FOR NAUSEA OR VOMITING.  . rosuvastatin (CRESTOR) 20 MG tablet Take 1 tablet (20 mg total) by mouth daily.  Marland Kitchen spironolactone (ALDACTONE) 25 MG tablet Take 1 tablet (25 mg total) by mouth daily.  . theophylline (THEO-24) 100 MG 24 hr capsule Take 1 capsule (100 mg total) by  mouth daily.  . traMADol (ULTRAM) 50 MG tablet Take 50 mg by mouth every 6 (six) hours as needed for moderate pain.    No facility-administered encounter medications on file as of 03/25/2021.    Surgical History: Past Surgical History:  Procedure Laterality Date  . BOWEL RESECTION  09/11/2019   Procedure: SMALL BOWEL RESECTION;  Surgeon: Herbert Pun, MD;  Location: [facility] ORS;  Service: General;;  . CARDIAC CATHETERIZATION    . cataract surgery    . COLONOSCOPY WITH PROPOFOL N/A 03/19/2020   Procedure: COLONOSCOPY WITH PROPOFOL;  Surgeon: [name], MD;  Location: [facility] ENDOSCOPY;  Service: Gastroenterology;  Laterality: N/A;  . CORONARY ANGIOPLASTY    . INCISION AND DRAINAGE ABSCESS Right 06/29/2016   Procedure: INCISION AND DRAINAGE ABSCESS;  Surgeon: Florene Glen, MD;  Location: [facility] ORS;  Service: General;  Laterality: Right;  . INCISION AND DRAINAGE OF WOUND Left 06/29/2016   Procedure: IRRIGATION AND DEBRIDEMENT WOUND;  Surgeon: Florene Glen, MD;  Location: [facility] ORS;  Service: General;  Laterality: Left;  . IR ANGIO VERTEBRAL SEL SUBCLAVIAN INNOMINATE UNI R MOD SED  11/20/2020  . IR CT HEAD LTD  11/20/2020  . IR PERCUTANEOUS ART THROMBECTOMY/INFUSION INTRACRANIAL INC DIAG ANGIO  11/20/2020  . LAPAROSCOPIC RIGHT COLECTOMY  09/11/2019   Procedure: RIGHT COLECTOMY;  Surgeon: Herbert Pun, MD;  Location: [facility] ORS;  Service: General;;  . LAPAROSCOPY N/A 09/11/2019   Procedure: LAPAROSCOPY DIAGNOSTIC;  Surgeon: Herbert Pun, MD;  Location: [facility] ORS;  Service: General;  Laterality: N/A;  . LAPAROTOMY N/A 09/13/2019   Procedure: REOPENING OF RECENT LAPAROTOMYANASTOMOSIS OF BOWEL;  Surgeon: Herbert Pun, MD;  Location: [facility] ORS;  Service: General;  Laterality: N/A;  . RADIOLOGY WITH ANESTHESIA N/A 11/20/2020   Procedure: IR WITH ANESTHESIA - CODE STROKE;  Surgeon: Radiologist, Medication, MD;  Location: [place];  Service: Radiology;  Laterality: N/A;  .  RIGHT/LEFT HEART CATH AND CORONARY ANGIOGRAPHY Bilateral 02/27/2020   Procedure: RIGHT/LEFT HEART CATH AND CORONARY ANGIOGRAPHY;  Surgeon: Wellington Hampshire, MD;  Location: [place] [facility];  Service: Cardiovascular;  Laterality: Bilateral;  . VISCERAL ANGIOGRAPHY N/A 09/12/2019   Procedure: VISCERAL ANGIOGRAPHY;  Surgeon: Algernon Huxley, MD;  Location: [place] [facility];  Service: Cardiovascular;  Laterality: N/A;    Medical History: Past Medical History:  Diagnosis Date  . Asthma   . Chronic atrial fibrillation ([place])    a. diagnosed in 09/2016; b. failed flecainide and propafenone due to LE swelling and SOB, could not afford Multaq; c. CHADS2VASc => 5 (CHF, HTN, age x 1, nonobs CAD, female)--> Eliquis  . COPD (chronic obstructive pulmonary disease) ([place])   . GERD (gastroesophageal reflux disease)   . HFmrEF (heart failure with mid-range ejection fraction) ([place])    a. 12/2019 Echo: EF 40-45%.  . Hyperlipidemia   . Hypertension   . NICM (  nonischemic cardiomyopathy) ([place])    a. 12/2019 Echo: EF 40-45%, glob HK, mildly reduced RV fxn, sev dil LA. *HR 130 (afib) during study.  . Nonobstructive CAD (coronary artery disease)    a. Lexiscan Myoview 10/2016: no evidence of ischemia, EF 53%; b. 02/2020 Cath: LM nl, LAD 20p, 21m, LCX 20ost, OM1/2/3 nl, LPDA nl, LPL1/2 nl, LPAV nl, RCA small, nl.  . Obesity   . Obstructive sleep apnea   . Pulmonary hypertension ([place])   . Sleep apnea   . Stroke ([place])   . Systolic dysfunction    a. TTE 10/2016: EF 50%, mild LVH, moderately dilated LA, moderate MR/TR, mild pulmonary hypertension    Family History: Family History  Problem Relation Age of Onset  . Dementia Mother   . Osteoporosis Mother   . Vascular Disease Mother   . COPD Father   . Heart disease Brother     Social History   Socioeconomic History  . Marital status: Married    Spouse name: [name]   . Number of children: 2  . Years of education: Not on file  . Highest education level:  Not on file  Occupational History  . 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 Smoking status: Never Smoker  . Smokeless tobacco: Never Used  Vaping Use  . Vaping Use: Never used  Substance and Sexual Activity  . Alcohol use: No  . Drug use: No  . Sexual activity: Not on file  Other Topics Concern  . Not on file  Social History Narrative   Her grand daughter, [name] and Brittany's family moved in with her and husband, [name] to assist in their care   Daughter [name] assists with her care also   3 grandchildren: 7 great grandkids.     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 Difficulty of Paying Living Expenses: Not hard at all  Food Insecurity: No Food Insecurity  . Worried About Charity fundraiser in the Last Year: Never true  . Ran Out of Food in the Last Year: Never true  Transportation Needs: No Transportation Needs  . Lack of Transportation (Medical): No  . Lack of Transportation (Non-Medical): No  Physical Activity: Insufficiently Active  . Days of Exercise per Week: 2 days  . Minutes of Exercise per Session: 30 min  Stress: No Stress Concern Present  . Feeling of Stress : Only a little  Social Connections: Socially Integrated  . Frequency of Communication with Friends and Family: More than three times a week  . Frequency of Social Gatherings with Friends and Family: More than three times a week  . Attends Religious Services: More than 4 times per year  . Active Member of Clubs or Organizations: Yes  . Attends Archivist Meetings: More than 4 times per year  . Marital Status: Married  Human resources officer Violence: Not At Risk  . Fear of Current or Ex-Partner: No  . Emotionally Abused: No  . Physically Abused: No  . Sexually Abused: No      Review of Systems  Constitutional:  Negative for chills, diaphoresis and fatigue.  HENT: Negative for ear pain, postnasal drip and sinus pressure.   Eyes: Negative for photophobia, discharge, redness, itching and visual disturbance.  Respiratory:  Negative for cough, shortness of breath and wheezing.   Cardiovascular: Negative for chest pain, palpitations and leg swelling.  Gastrointestinal: Negative for abdominal pain, constipation, diarrhea, nausea and vomiting.  Genitourinary: Negative for dysuria and flank pain.  Musculoskeletal: Negative for arthralgias, back pain, gait problem and neck pain.  Skin: Negative for color change.  Allergic/Immunologic: Negative for environmental allergies and food allergies.  Neurological: Positive for weakness. Negative for dizziness and headaches.       Left sided weakness  Hematological: Does not bruise/bleed easily.  Psychiatric/Behavioral: Negative for agitation, behavioral problems (depression) and hallucinations.    Vital Signs: BP 140/70   Pulse 72   Temp (!) 97.5 F (36.4 C)   Resp 16   Ht 5\' 4"  (1.626 m)   Wt 235 lb 9.6 oz (106.9 kg)   LMP  (LMP Unknown)   SpO2 99%   BMI 40.44 kg/m    Physical Exam Vitals reviewed.  Constitutional:      Appearance: Normal appearance. She is obese.  Cardiovascular:     Rate and Rhythm: Normal rate and regular rhythm.     Pulses: Normal pulses.     Heart sounds: Normal heart sounds.  Pulmonary:     Effort: Pulmonary effort is normal.     Breath sounds: Normal breath sounds.  Abdominal:     General: Abdomen is flat.     Palpations: Abdomen is soft.  Musculoskeletal:        General: Normal range of motion.     Cervical back: Normal range of motion.  Skin:    General: Skin is warm.  Neurological:     General: No focal deficit present.     Mental Status: She is alert and oriented to person, place, and time. Mental status is at baseline.  Psychiatric:        Mood and Affect: Mood normal.        Behavior: Behavior normal.         Thought Content: Thought content normal.        Judgment: Judgment normal.    Assessment/Plan: 1. Essential hypertension BP and HR well controlled on current management, will continue to monitor  2. History of CVA with residual deficit Left sided weakness improving, continue with PT and OT  3. Chronic systolic heart failure (HCC) Recently started back on spironolactone by cardiology, improvement in bilateral lower extremity edema, shortness of breath significantly improved  4. Morbid obesity (HCC) BMI 40, comorbid conditions include HTN and CHF Encouraged to continue with healthy eating habits and increase activity levels as tolerated Obesity Counseling: Risk Assessment: An assessment of behavioral risk factors was made today and includes lack of exercise sedentary lifestyle, lack of portion control and poor dietary habits.  Risk Modification Advice: She was counseled on portion control guidelines. Restricting daily caloric intake to 1800. The detrimental long term effects of obesity on her health and ongoing poor compliance was also discussed with the patient.  General Counseling: patton swisher understanding of the findings of todays visit and agrees with plan of treatment. I have discussed any further diagnostic evaluation that may be needed or ordered today. We also reviewed her medications today. she has been encouraged to call the office with any questions or concerns that should arise related to todays visit.   Time spent: 30 Minutes Time spent includes review of chart, medications, test results and follow-up plan with the patient.  This patient was seen by Theodoro Grist AGNP-C in Collaboration with Dr Timoteo Gaul  Humphrey Rolls as a part of collaborative care agreement     [employer]. Deuce Paternoster AGNP-C Internal medicine

## 2021-03-26 ENCOUNTER — Encounter: Payer: Self-pay | Admitting: Hospice and Palliative Medicine

## 2021-03-27 ENCOUNTER — Other Ambulatory Visit: Payer: Self-pay | Admitting: Internal Medicine

## 2021-03-27 DIAGNOSIS — R0602 Shortness of breath: Secondary | ICD-10-CM

## 2021-03-29 ENCOUNTER — Telehealth: Payer: Self-pay

## 2021-03-29 NOTE — Telephone Encounter (Signed)
Confirmation of verbal order for medical supplies signed by provider and placed in [facility] Patient folder ready for pickup. Laura [name]

## 2021-04-01 ENCOUNTER — Encounter: Payer: Self-pay | Admitting: *Deleted

## 2021-04-01 DIAGNOSIS — M62830 Muscle spasm of back: Secondary | ICD-10-CM | POA: Diagnosis not present

## 2021-04-01 DIAGNOSIS — M5416 Radiculopathy, lumbar region: Secondary | ICD-10-CM | POA: Diagnosis not present

## 2021-04-01 DIAGNOSIS — M461 Sacroiliitis, not elsewhere classified: Secondary | ICD-10-CM | POA: Diagnosis not present

## 2021-04-01 DIAGNOSIS — M479 Spondylosis, unspecified: Secondary | ICD-10-CM | POA: Diagnosis not present

## 2021-04-01 DIAGNOSIS — M545 Low back pain, unspecified: Secondary | ICD-10-CM | POA: Diagnosis not present

## 2021-04-03 ENCOUNTER — Ambulatory Visit (INDEPENDENT_AMBULATORY_CARE_PROVIDER_SITE_OTHER): Payer: Medicare Other | Admitting: Adult Health

## 2021-04-03 ENCOUNTER — Encounter: Payer: Self-pay | Admitting: Adult Health

## 2021-04-03 VITALS — BP 121/70 | HR 44 | Ht 64.0 in | Wt 236.0 lb

## 2021-04-03 DIAGNOSIS — I63411 Cerebral infarction due to embolism of right middle cerebral artery: Secondary | ICD-10-CM

## 2021-04-03 DIAGNOSIS — M461 Sacroiliitis, not elsewhere classified: Secondary | ICD-10-CM | POA: Diagnosis not present

## 2021-04-03 DIAGNOSIS — I480 Paroxysmal atrial fibrillation: Secondary | ICD-10-CM

## 2021-04-03 NOTE — Progress Notes (Signed)
I agree with the above plan 

## 2021-04-03 NOTE — Patient Instructions (Signed)
Continue working with therapy for likely ongoing improvement - you have been making great recovery so far.... Keep up the good work!!  Continue Eliquis (apixaban) daily  and Crestor for secondary stroke prevention  Continue to follow up with PCP regarding cholesterol, blood pressure and diabetes management  Maintain strict control of hypertension with blood pressure goal below 130/90, diabetes with hemoglobin A1c goal below 7% and cholesterol with LDL cholesterol (bad cholesterol) goal below 70 mg/dL.       Followup in the future with me in 6 months or call earlier if needed       Thank you for coming to see [place] at [facility]. I hope we have been able to provide you high quality care today.  You may receive a patient satisfaction survey over the next few weeks. We would appreciate your feedback and comments so that we may continue to improve ourselves and the health of our patients.

## 2021-04-03 NOTE — Progress Notes (Signed)
[facility] [address] [place]. [place] [address] [phone]       STROKE FOLLOW UP NOTE  [name] Date of Birth:  1951/11/13 Medical Record Number:  [id number]   Reason for Referral: stroke follow up    SUBJECTIVE:   CHIEF COMPLAINT:  Chief Complaint  Patient presents with  . Follow-up    TR alone Pt is well, weakness in legs, pain in back but doing well on stroke standpoint.     HPI:   Today, 04/03/2021, [name] returns for 45-month stroke follow-up unaccompanied  She has been doing much better since prior visit with improvement of left-sided weakness.  Continues to experience left shoulder stiffness and left ankle weakness/instability but overall greatly recovering.  She has been working with PT which has been beneficial.  Denies new or worsening stroke/TIA symptoms  Continues to struggle with low back pain and radiculopathy routinely followed by [facility] and plans on possibly obtaining SI joint injections for further symptomatic relief  Compliant on Eliquis and Crestor without associated side effects Blood pressure today 121/70 Reports nightly use of CPAP for OSA management  Prior concerns of nausea and altered taste/smell has since subsided and has been able to maintain adequate nutritional intake.  She has been trying to maintain a healthy diet and her current weight loss goal is to reach 200lbs.  Today, weight currently at 236lbs; prior to her stroke she was at 268 lbs!  No further concerns at this time     History provided for reference purposes only Initial visit 01/02/2021 JM: Ms. Ganesh is being seen for hospital follow-up unaccompanied. Her main concern today is nausea with altered taste and smell limiting oral intake which has been present since her stroke. She reports 30 lb weight loss over the past 2 months.  She denies dizziness, vertigo or any other associated symptom contributing to nausea.  She is currently  receiving therapy at home for residual stroke deficits of mild left sided weakness and gait impairment. Denies residual dysphagia. Use of rollator walker and at times has been using cane.  Denies new or worsening stroke/TIA symptoms.  She has remained on Eliquis 5 mg twice daily without bleeding or bruising.  Remains on Crestor 20 mg daily without myalgias.  Blood pressure today 144/78.  Reports nightly use of CPAP for OSA management.  No further concerns at this time.  Stroke admission 11/20/2020 Ms.Laura [name] a 70 y.o.femalewith history of chronic atrial fibrillation on Eliquis, COPD, gastroesophageal reflux disease, heart failure with midrange ejection fraction, hypertension, hyperlipidemia, nonischemic cardiomyopathy, obstructive sleep apnea, obesity, who presented on 11/20/2020 with L sided weakness w/ R gaze preference, slurred speech and L facial droop.  Personally reviewed hospitalization pertinent progress notes, lab work and imaging with summary provided.  Evaluated by Dr. Erlinda Hong with stroke work-up revealing patchy right MCA infarcts due to right M1 occlusion s/p IR w/ TICI 3 revascularization, infarct embolic secondary to known AF.  Post IR small SAH post  persylvian fissure.  Eliquis was held 11/20-11/24 for lumbar injection and restarted 11/25 but apparently few episodes of left hand weakness and some episodic HA and confusion.  Advised to resume Eliquis 12/5 (5 days post stroke) for secondary stroke prevention.  History of HTN and resumed home dose Cardizem, Lasix and spironolactone.  History of HLD on Crestor 20 mg daily with LDL 80.  No prior history of DM.  Other stroke risk factors include advanced age, morbid obesity, OSA on CPAP, CAD,  systolic CHF, NICM with a EF 40 to 45%, hx of DVT and PAD.  No prior stroke history.  Other problems include anxiety, COPD and GERD.  Residual deficits of mild left lower facial weakness, left hemiparesis and dysphagia.  Per therapy recommendations, she  was discharged home with [facility] PT/OT/SLP.  Stroke:PatchyR MCA infarctsdue to right M1 occlusion s/p IR w/ TICI3 revascularization, infarct embolic secondary to known AF   Code Stroke CT head No acute abnormality. Dense R M1.ASPECTS 10.   CTA head &neck ELVO R M1. Mild ICA atherosclerosis. L subclavian origin 55% stenosis.  Cerebral angio / IR TICI3 revascularization R MCA occlusion  Post IR CT small SAH post perisylvian fissurew/ contrast stain R parietal subcortical region  MRIpatchy R MCA territory infarcts w/ small SAH  MRApatent R MCA  2D EchoEF 45 to 50%  LDL80  HgbA1c6.2  Eliquis (apixaban) dailyprior to admission, now on aspirin 325. Resume Eliquis Sunday morning, Dec 5, stop aspirin at that time.   Therapy recommendations:HH OT, HH PT, HH SLP  Disposition: return home     ROS:   14 system review of systems performed and negative with exception of those listed in HPI  PMH:  Past Medical History:  Diagnosis Date  . Asthma   . Chronic atrial fibrillation ([place])    a. diagnosed in 09/2016; b. failed flecainide and propafenone due to LE swelling and SOB, could not afford Multaq; c. CHADS2VASc => 5 (CHF, HTN, age x 1, nonobs CAD, female)--> Eliquis  . COPD (chronic obstructive pulmonary disease) ([place])   . GERD (gastroesophageal reflux disease)   . HFmrEF (heart failure with mid-range ejection fraction) ([place])    a. 12/2019 Echo: EF 40-45%.  . Hyperlipidemia   . Hypertension   . NICM (nonischemic cardiomyopathy) ([place])    a. 12/2019 Echo: EF 40-45%, glob HK, mildly reduced RV fxn, sev dil LA. *HR 130 (afib) during study.  . Nonobstructive CAD (coronary artery disease)    a. Lexiscan Myoview 10/2016: no evidence of ischemia, EF 53%; b. 02/2020 Cath: LM nl, LAD 20p, 50m, LCX 20ost, OM1/2/3 nl, LPDA nl, LPL1/2 nl, LPAV nl, RCA small, nl.  . Obesity   . Obstructive sleep apnea   . Pulmonary hypertension ([place])   . Sleep apnea   . Stroke ([place])   .  Systolic dysfunction    a. TTE 10/2016: EF 50%, mild LVH, moderately dilated LA, moderate MR/TR, mild pulmonary hypertension    PSH:  Past Surgical History:  Procedure Laterality Date  . BOWEL RESECTION  09/11/2019   Procedure: SMALL BOWEL RESECTION;  Surgeon: Herbert Pun, MD;  Location: [facility] ORS;  Service: General;;  . CARDIAC CATHETERIZATION    . cataract surgery    . COLONOSCOPY WITH PROPOFOL N/A 03/19/2020   Procedure: COLONOSCOPY WITH PROPOFOL;  Surgeon: [name], MD;  Location: [facility] ENDOSCOPY;  Service: Gastroenterology;  Laterality: N/A;  . CORONARY ANGIOPLASTY    . INCISION AND DRAINAGE ABSCESS Right 06/29/2016   Procedure: INCISION AND DRAINAGE ABSCESS;  Surgeon: Florene Glen, MD;  Location: [facility] ORS;  Service: General;  Laterality: Right;  . INCISION AND DRAINAGE OF WOUND Left 06/29/2016   Procedure: IRRIGATION AND DEBRIDEMENT WOUND;  Surgeon: Florene Glen, MD;  Location: [facility] ORS;  Service: General;  Laterality: Left;  . IR ANGIO VERTEBRAL SEL SUBCLAVIAN INNOMINATE UNI R MOD SED  11/20/2020  . IR CT HEAD LTD  11/20/2020  . IR PERCUTANEOUS ART THROMBECTOMY/INFUSION INTRACRANIAL INC DIAG ANGIO  11/20/2020  . LAPAROSCOPIC  RIGHT COLECTOMY  09/11/2019   Procedure: RIGHT COLECTOMY;  Surgeon: Herbert Pun, MD;  Location: [facility] ORS;  Service: General;;  . LAPAROSCOPY N/A 09/11/2019   Procedure: LAPAROSCOPY DIAGNOSTIC;  Surgeon: Herbert Pun, MD;  Location: [facility] ORS;  Service: General;  Laterality: N/A;  . LAPAROTOMY N/A 09/13/2019   Procedure: REOPENING OF RECENT LAPAROTOMYANASTOMOSIS OF BOWEL;  Surgeon: Herbert Pun, MD;  Location: [facility] ORS;  Service: General;  Laterality: N/A;  . RADIOLOGY WITH ANESTHESIA N/A 11/20/2020   Procedure: IR WITH ANESTHESIA - CODE STROKE;  Surgeon: Radiologist, Medication, MD;  Location: [place];  Service: Radiology;  Laterality: N/A;  . RIGHT/LEFT HEART CATH AND CORONARY ANGIOGRAPHY Bilateral 02/27/2020   Procedure:  RIGHT/LEFT HEART CATH AND CORONARY ANGIOGRAPHY;  Surgeon: Wellington Hampshire, MD;  Location: [facility] CV LAB;  Service: Cardiovascular;  Laterality: Bilateral;  . VISCERAL ANGIOGRAPHY N/A 09/12/2019   Procedure: VISCERAL ANGIOGRAPHY;  Surgeon: Algernon Huxley, MD;  Location: [facility] LAB;  Service: Cardiovascular;  Laterality: N/A;    Social History:  Social History   Socioeconomic History  . Marital status: Married    Spouse name: [name]   . Number of children: 2  . Years of education: Not on file  . Highest education level: Not on file  Occupational History  . 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 Smoking status: Never Smoker  . Smokeless tobacco: Never Used  Vaping Use  . Vaping Use: Never used  Substance and Sexual Activity  . Alcohol use: No  . Drug use: No  . Sexual activity: Not on file  Other Topics Concern  . Not on file  Social History Narrative   Her grand daughter, [name] and Brittany's family moved in with her and husband, [name] to assist in their care   Daughter [name] assists with her care also   3 grandchildren: 7 great grandkids.     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 Difficulty of Paying Living Expenses: Not hard at all  Food Insecurity: No Food Insecurity  . Worried About Charity fundraiser in the Last Year: Never true  . Ran Out of Food in the Last Year: Never true  Transportation Needs: No Transportation Needs  . Lack of Transportation (Medical): No  . Lack of Transportation (Non-Medical): No  Physical Activity: Insufficiently Active  . Days of Exercise per Week: 2 days  . Minutes of Exercise per Session: 30 min  Stress: No Stress Concern Present  . Feeling of Stress : Only a little  Social Connections: Socially  Integrated  . Frequency of Communication with Friends and Family: More than three times a week  . Frequency of Social Gatherings with Friends and Family: More than three times a week  . Attends Religious Services: More than 4 times per year  . Active Member of Clubs or Organizations: Yes  . Attends Archivist Meetings: More than 4 times per year  . Marital Status: Married  Human resources officer Violence: Not At Risk  . Fear of Current or Ex-Partner: No  . Emotionally Abused: No  . Physically Abused: No  . Sexually Abused: No    Family History:  Family History  Problem Relation Age of Onset  .  Dementia Mother   . Osteoporosis Mother   . Vascular Disease Mother   . COPD Father   . Heart disease Brother     Medications:   Current Outpatient Medications on File Prior to Visit  Medication Sig Dispense Refill  . Accu-Chek Softclix Lancets lancets Use as instructed to check blood sugars daily 2 hours after meal 100 each 12  . Acetaminophen-Codeine 300-30 MG tablet Take 1 tablet by mouth every 6 (six) hours.    Marland Kitchen albuterol (VENTOLIN HFA) 108 (90 Base) MCG/ACT inhaler Inhale 2 puffs into the lungs every 4 (four) hours as needed for wheezing or shortness of breath. 18 g 3  . ALPRAZolam (XANAX) 0.25 MG tablet Take 0.25 mg by mouth 2 (two) times daily as needed for anxiety.    Marland Kitchen apixaban (ELIQUIS) 5 MG TABS tablet Take 1 tablet (5 mg total) by mouth 2 (two) times daily. 60 tablet 6  . baclofen (LIORESAL) 10 MG tablet Take 10 mg by mouth 3 (three) times daily as needed.    . benzonatate (TESSALON) 100 MG capsule Take 1 capsule (100 mg total) by mouth 2 (two) times daily as needed for cough. 20 capsule 2  . diltiazem (CARDIZEM CD) 360 MG 24 hr capsule Take 1 capsule (360 mg total) by mouth daily. 90 capsule 2  . EPINEPHrine 0.3 mg/0.3 mL IJ SOAJ injection Inject 0.3 mg into the muscle as needed for anaphylaxis.     . fluticasone (FLONASE) 50 MCG/ACT nasal spray Place into both nostrils  daily.    . furosemide (LASIX) 20 MG tablet Take 1 tablet (20 mg total) by mouth daily. 30 tablet 5  . gabapentin (NEURONTIN) 300 MG capsule Take by mouth.    Marland Kitchen glucose blood (ACCU-CHEK GUIDE) test strip Use as instructed to check blood sugars daily 2 hours after meal 100 each 12  . ipratropium-albuterol (DUONEB) 0.5-2.5 (3) MG/3ML SOLN Take 3 mLs by nebulization every 4 (four) hours as needed. 120 mL 3  . loperamide (IMODIUM) 2 MG capsule Take 4 mg by mouth as needed for diarrhea or loose stools.    Marland Kitchen losartan (COZAAR) 25 MG tablet Take 1 tablet (25 mg total) by mouth daily. 90 tablet 1  . Melatonin 10 MG TABS Take 10 mg by mouth at bedtime as needed.    . montelukast (SINGULAIR) 10 MG tablet Take 1 tablet (10 mg total) by mouth daily. 90 tablet 1  . omeprazole (PRILOSEC) 40 MG capsule TAKE 1 CAPSULE BY MOUTH EVERY DAY 90 capsule 0  . ondansetron (ZOFRAN) 4 MG tablet Take 1 tablet (4 mg total) by mouth every 8 (eight) hours as needed for nausea or vomiting. 60 tablet 1  . predniSONE (DELTASONE) 10 MG tablet TAKE 1 TABLET (10 MG TOTAL) BY MOUTH DAILY WITH BREAKFAST. 30 tablet 1  . Probiotic Product (PROBIOTIC-10) CAPS Take 1 capsule by mouth daily.     . promethazine (PHENERGAN) 12.5 MG tablet TAKE 1 TABLET (12.5 MG TOTAL) BY MOUTH EVERY 12 (TWELVE) HOURS AS NEEDED FOR NAUSEA OR VOMITING. 30 tablet 0  . rosuvastatin (CRESTOR) 20 MG tablet Take 1 tablet (20 mg total) by mouth daily. 90 tablet 3  . spironolactone (ALDACTONE) 25 MG tablet Take 1 tablet (25 mg total) by mouth daily. 30 tablet 2  . theophylline (THEO-24) 100 MG 24 hr capsule Take 1 capsule (100 mg total) by mouth daily. 30 capsule 3  . traMADol (ULTRAM) 50 MG tablet Take 50 mg by mouth every 6 (six) hours  as needed for moderate pain.      No current facility-administered medications on file prior to visit.    Allergies:   Allergies  Allergen Reactions  . Flecainide Shortness Of Breath  . Metoprolol Shortness Of Breath, Swelling  and Other (See Comments)    "My limbs swell" also  . Propafenone Shortness Of Breath, Swelling and Other (See Comments)    "My limbs swell" also  . Rivaroxaban Swelling and Other (See Comments)    Xarelto- "My limbs swell"      OBJECTIVE:  Physical Exam  Vitals:   04/03/21 1009  BP: 121/70  Pulse: (!) 44  Weight: 236 lb (107 kg)  Height: 5\' 4"  (1.626 m)   Body mass index is 40.51 kg/m. No exam data present  General: Morbidly obese very pleasant elderly Caucasian female, seated Head: head normocephalic and atraumatic.   Neck: supple with no carotid or supraclavicular bruits Cardiovascular: irregular rate and rhythm, no murmurs Musculoskeletal: no deformity Skin:  no rash/petichiae Vascular:  Normal pulses all extremities   Neurologic Exam Mental Status: Awake and fully alert. Fluent speech and language. Oriented to place and time. Recent and remote memory intact. Attention span, concentration and fund of knowledge appropriate. Mood and affect appropriate.  Cranial Nerves: Fundoscopic exam reveals sharp disc margins. Pupils equal, briskly reactive to light. Extraocular movements full without nystagmus. Visual fields full to confrontation. Hearing intact. Facial sensation intact. Face, tongue, palate moves normally and symmetrically.  Motor: Normal bulk and tone and strength right upper and lower extremity. LUE: 5/5 with increased tone proximal; LLE: 5/5 proximal and 4/5 ankle Sensory.: intact to touch , pinprick , position and vibratory sensation.  Coordination: Rapid alternating movements normal in all extremities.  Finger-to-nose and heel-to-shin performed accurately bilaterally.  Gait and Station: Arises from chair without difficulty. Stance is normal. Gait demonstrates  decreased stride length and step height LLEwith use of Rollator walker Reflexes: 1+ and symmetric. Toes downgoing.       ASSESSMENT: Laura [name] is a 70 y.o. year old female presented with  left-sided weakness w/ right gaze preference, slurred speech and left facial droop on 11/20/2020 with stroke work-up revealing patchy right MCA infarcts due to right M1 occlusion s/p IR with TICI 3 revascularization, infarct embolic secondary to known AF. Vascular risk factors include A. fib, HTN, HLD, morbid obesity, systolic CHF, nonischemic cardiomyopathy, hx of DVT, PAD and OSA on CPAP.      PLAN:  1. R MCA stroke :  a. Residual deficit: left shoulder stiffness and left ankle weakness -greatly improved compared to prior visit.  Encouraged continued participation with outpatient PT for hopeful further recovery  b. Continue Eliquis (apixaban) daily  and Crestor 20 mg daily for secondary stroke prevention.   c. Discussed secondary stroke prevention measures and importance of close PCP follow up for aggressive stroke risk factor management  d. HTN: BP goal<130/90.  Stable on current regimen per PCP e. HLD: LDL goal<70. On Crestor 20 mg daily per PCP 2. Atrial fibrillation: Remains on Eliquis 5 mg twice daily for CHA2DS2-VASc score of at least 7.  Routinely followed by cardiology 3. Lumbar radiculopathy: Routinely followed by [facility]    Follow up in 6 months or call earlier if needed   CC:  [facility] provider: Dr. Sigmund Hazel, Timoteo Gaul, MD    I spent 30 minutes of face-to-face and non-face-to-face time with patient.  This included previsit chart review, lab review, study review, order entry, electronic health record documentation,  patient education and discussion regarding prior stroke including etiology, residual deficits, importance of managing stroke risk factors, continued lower back pain and answered all other questions to patient satisfaction  Frann Rider, [facility]  [facility] [address] [place], [place] [address]  Phone [phone] Fax [phone] Note: This document was prepared with digital dictation and possible smart phrase technology.  Any transcriptional errors that result from this process are unintentional.

## 2021-04-10 ENCOUNTER — Other Ambulatory Visit: Payer: Self-pay

## 2021-04-10 DIAGNOSIS — R0602 Shortness of breath: Secondary | ICD-10-CM

## 2021-04-10 DIAGNOSIS — J449 Chronic obstructive pulmonary disease, unspecified: Secondary | ICD-10-CM

## 2021-04-10 DIAGNOSIS — M5459 Other low back pain: Secondary | ICD-10-CM | POA: Diagnosis not present

## 2021-04-10 DIAGNOSIS — R293 Abnormal posture: Secondary | ICD-10-CM | POA: Diagnosis not present

## 2021-04-10 MED ORDER — ACCU-CHEK GUIDE VI STRP: ORAL_STRIP | 12 refills | Status: DC

## 2021-04-10 MED ORDER — ALBUTEROL SULFATE HFA 108 (90 BASE) MCG/ACT IN AERS: 2.0000 | INHALATION_SPRAY | RESPIRATORY_TRACT | 3 refills | Status: DC | PRN

## 2021-04-11 ENCOUNTER — Other Ambulatory Visit: Payer: Self-pay

## 2021-04-11 MED ORDER — ACCU-CHEK GUIDE VI STRP: ORAL_STRIP | 12 refills | Status: DC

## 2021-04-12 DIAGNOSIS — M5459 Other low back pain: Secondary | ICD-10-CM | POA: Diagnosis not present

## 2021-04-12 DIAGNOSIS — R293 Abnormal posture: Secondary | ICD-10-CM | POA: Diagnosis not present

## 2021-04-16 DIAGNOSIS — R293 Abnormal posture: Secondary | ICD-10-CM | POA: Diagnosis not present

## 2021-04-16 DIAGNOSIS — M5459 Other low back pain: Secondary | ICD-10-CM | POA: Diagnosis not present

## 2021-04-18 ENCOUNTER — Other Ambulatory Visit: Payer: Self-pay | Admitting: Internal Medicine

## 2021-04-18 DIAGNOSIS — R293 Abnormal posture: Secondary | ICD-10-CM | POA: Diagnosis not present

## 2021-04-18 DIAGNOSIS — M5459 Other low back pain: Secondary | ICD-10-CM | POA: Diagnosis not present

## 2021-04-19 ENCOUNTER — Other Ambulatory Visit: Payer: Self-pay

## 2021-04-19 MED ORDER — FLUCONAZOLE 150 MG PO TABS: ORAL_TABLET | ORAL | 0 refills | Status: DC

## 2021-04-22 ENCOUNTER — Other Ambulatory Visit: Payer: Self-pay | Admitting: *Deleted

## 2021-04-22 ENCOUNTER — Encounter: Payer: Self-pay | Admitting: *Deleted

## 2021-04-22 ENCOUNTER — Other Ambulatory Visit: Payer: Self-pay

## 2021-04-22 ENCOUNTER — Ambulatory Visit (INDEPENDENT_AMBULATORY_CARE_PROVIDER_SITE_OTHER): Payer: Medicare Other

## 2021-04-22 DIAGNOSIS — M545 Low back pain, unspecified: Secondary | ICD-10-CM | POA: Diagnosis not present

## 2021-04-22 DIAGNOSIS — M479 Spondylosis, unspecified: Secondary | ICD-10-CM | POA: Diagnosis not present

## 2021-04-22 DIAGNOSIS — M461 Sacroiliitis, not elsewhere classified: Secondary | ICD-10-CM | POA: Diagnosis not present

## 2021-04-22 DIAGNOSIS — M62830 Muscle spasm of back: Secondary | ICD-10-CM | POA: Diagnosis not present

## 2021-04-22 DIAGNOSIS — E538 Deficiency of other specified B group vitamins: Secondary | ICD-10-CM | POA: Diagnosis not present

## 2021-04-22 DIAGNOSIS — M5459 Other low back pain: Secondary | ICD-10-CM | POA: Diagnosis not present

## 2021-04-22 DIAGNOSIS — R293 Abnormal posture: Secondary | ICD-10-CM | POA: Diagnosis not present

## 2021-04-22 MED ORDER — CYANOCOBALAMIN 1000 MCG/ML IJ SOLN
1000.0000 ug | Freq: Once | INTRAMUSCULAR | Status: AC
  Administered 2021-04-22: 1000 ug via INTRAMUSCULAR
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
## 2021-04-25 DIAGNOSIS — R293 Abnormal posture: Secondary | ICD-10-CM | POA: Diagnosis not present

## 2021-04-25 DIAGNOSIS — M5459 Other low back pain: Secondary | ICD-10-CM | POA: Diagnosis not present

## 2021-04-29 DIAGNOSIS — R293 Abnormal posture: Secondary | ICD-10-CM | POA: Diagnosis not present

## 2021-04-29 DIAGNOSIS — M5459 Other low back pain: Secondary | ICD-10-CM | POA: Diagnosis not present

## 2021-05-01 ENCOUNTER — Other Ambulatory Visit: Payer: Self-pay

## 2021-05-01 ENCOUNTER — Telehealth: Payer: Self-pay

## 2021-05-01 MED ORDER — MUPIROCIN CALCIUM 2 % NA OINT: TOPICAL_OINTMENT | NASAL | 0 refills | Status: DC

## 2021-05-01 NOTE — Telephone Encounter (Signed)
Spoke to pt and asked if she needed an order from [facility] we received by fax for new nebulizer and supplies with duoned medication.  Pt informed me that she needed everything.  I faxed signed order back to [facility] at [phone]

## 2021-05-02 ENCOUNTER — Telehealth: Payer: Self-pay

## 2021-05-02 ENCOUNTER — Other Ambulatory Visit: Payer: Self-pay

## 2021-05-02 ENCOUNTER — Other Ambulatory Visit: Payer: Self-pay | Admitting: Family

## 2021-05-02 DIAGNOSIS — J209 Acute bronchitis, unspecified: Secondary | ICD-10-CM

## 2021-05-02 DIAGNOSIS — J42 Unspecified chronic bronchitis: Secondary | ICD-10-CM

## 2021-05-02 DIAGNOSIS — R293 Abnormal posture: Secondary | ICD-10-CM | POA: Diagnosis not present

## 2021-05-02 DIAGNOSIS — M5459 Other low back pain: Secondary | ICD-10-CM | POA: Diagnosis not present

## 2021-05-02 MED ORDER — IPRATROPIUM-ALBUTEROL 0.5-2.5 (3) MG/3ML IN SOLN: RESPIRATORY_TRACT | 12 refills | Status: DC

## 2021-05-02 NOTE — Telephone Encounter (Signed)
Faxed order for ned machine and medication (duoneb) [facility], Ashly from [place] came to office and I spoke to him about the order and he took the order and we ordered thru ashly at [employer].

## 2021-05-06 ENCOUNTER — Other Ambulatory Visit: Payer: Self-pay | Admitting: *Deleted

## 2021-05-06 NOTE — Patient Outreach (Addendum)
[place] [facility]) Care Management  05/06/2021  Laura [name] 12-21-1951 [id number]   [place] coordination- counseling provider search  Collaboration/e-mail to [facility] care guides to inquire about possible list of medicare accepting christian counselors after online search on medicare.gov and goggle Previous consult with [facility] confirmed no available resource  Care guide resource not available  Outreach possible agencies to confirm [facility] [place] does not accept medicare patients at this time.-Katie    [facility]  ([address] [place], [place] [address] [phone] Prompt 2) does accept medicare patients per Elmyra Ricks  No answer at [facility] [phone]   [facility] ([place] [address] [phone]) will be accepting new medicare Christian patients possibly in July 2022 per [facility]  [place] [phone] per [place]  Outreach to patient to update her  No answer Message left to include [facility] RN CM office number and Programmer, systems with names of the agencies meeting counseling criteria  Plan [facility] RN CM will follow up with patient within the next 21-28 business day Pt encouraged to return a call to [facility] RN CM prn Note to MDs   Joelene Millin L. Lavina Hamman, RN, BSN, [place] Coordinator Office number [phone] Mobile number [phone]  Main [facility] number [phone] Fax number [phone]

## 2021-05-10 ENCOUNTER — Ambulatory Visit: Payer: Medicare Other | Admitting: Nurse Practitioner

## 2021-05-10 ENCOUNTER — Encounter: Payer: Self-pay | Admitting: Nurse Practitioner

## 2021-05-10 ENCOUNTER — Telehealth: Payer: Self-pay

## 2021-05-10 NOTE — Telephone Encounter (Signed)
Pt called that she is out of town and she is having sinus infection,chest pain and coughing and trouble breathing as per dr Humphrey Rolls advised her that she need go to Urgent care due to her symptoms and her oxygen 85 -90 pt was agree that she need to been seen by urgent

## 2021-05-13 ENCOUNTER — Encounter: Payer: Self-pay | Admitting: Physician Assistant

## 2021-05-13 ENCOUNTER — Other Ambulatory Visit: Payer: Self-pay | Admitting: Internal Medicine

## 2021-05-13 ENCOUNTER — Ambulatory Visit (INDEPENDENT_AMBULATORY_CARE_PROVIDER_SITE_OTHER): Payer: Medicare Other | Admitting: Physician Assistant

## 2021-05-13 DIAGNOSIS — J069 Acute upper respiratory infection, unspecified: Secondary | ICD-10-CM | POA: Diagnosis not present

## 2021-05-13 DIAGNOSIS — G4733 Obstructive sleep apnea (adult) (pediatric): Secondary | ICD-10-CM

## 2021-05-13 DIAGNOSIS — R0902 Hypoxemia: Secondary | ICD-10-CM

## 2021-05-13 DIAGNOSIS — R0602 Shortness of breath: Secondary | ICD-10-CM

## 2021-05-13 MED ORDER — AZITHROMYCIN 250 MG PO TABS: ORAL_TABLET | ORAL | 0 refills | Status: DC
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
## 2021-05-20 ENCOUNTER — Other Ambulatory Visit: Payer: Self-pay | Admitting: Gastroenterology

## 2021-05-29 ENCOUNTER — Other Ambulatory Visit: Payer: Self-pay | Admitting: Internal Medicine

## 2021-05-29 DIAGNOSIS — R0602 Shortness of breath: Secondary | ICD-10-CM

## 2021-05-30 DIAGNOSIS — M5459 Other low back pain: Secondary | ICD-10-CM | POA: Diagnosis not present

## 2021-05-30 DIAGNOSIS — R293 Abnormal posture: Secondary | ICD-10-CM | POA: Diagnosis not present

## 2021-05-31 ENCOUNTER — Other Ambulatory Visit: Payer: Self-pay

## 2021-05-31 ENCOUNTER — Other Ambulatory Visit: Payer: Self-pay | Admitting: *Deleted

## 2021-05-31 ENCOUNTER — Encounter: Payer: Self-pay | Admitting: *Deleted

## 2021-05-31 DIAGNOSIS — I63511 Cerebral infarction due to unspecified occlusion or stenosis of right middle cerebral artery: Secondary | ICD-10-CM

## 2021-05-31 DIAGNOSIS — M5441 Lumbago with sciatica, right side: Secondary | ICD-10-CM

## 2021-05-31 DIAGNOSIS — I5022 Chronic systolic (congestive) heart failure: Secondary | ICD-10-CM

## 2021-05-31 DIAGNOSIS — J4550 Severe persistent asthma, uncomplicated: Secondary | ICD-10-CM

## 2021-05-31 DIAGNOSIS — J441 Chronic obstructive pulmonary disease with (acute) exacerbation: Secondary | ICD-10-CM

## 2021-05-31 DIAGNOSIS — I4891 Unspecified atrial fibrillation: Secondary | ICD-10-CM

## 2021-05-31 NOTE — Patient Outreach (Signed)
[facility]) Care Management  05/31/2021  Laura [name] Jul 17, 1951 [id number]  [facility] outreach to complex care patient referred by [facility] Laura [name] was referred to [facility] on 12/04/20 for EMMI stroke referral issues related questions/problems with medicines, lost interest in things they used to enjoy and symptoms of sad, hopeless, anxious, or empty was reported. After several unsuccessful outreaches, she returned a call on 12/12/20 to initiate EMMI Stroke services On 12/24/20 she reported further medication cost issues and was referred to [place] admission 11/20/20-11/23/20 Acute R MCA ischemic stroke [facility]) s/p revascularization R M1, embolic d/t known AF 5/62/56-3/89/37 enteritis and Atrial fibrillation with rapid ventricular response Insurance NextGen Medicare and Aetna     Successful outreach to home number [phone]  Patient is able to verify HIPAA ([place] and [place]) identifiers Reviewed and addressed the purpose of the follow up call with the patient   Consent: [facility] ([place]) RN CM reviewed [facility] services with patient. Patient gave verbal consent for services.   Follow up assessment  Covid/Chronic obstructive pulmonary disease (COPD) Covid again 2 weeks ago with positive contact with her daughter in law/son This is the patient's 3rd positive covid test in last 2 yrs, hx COPD,has home covid kits, Has not had and does not want covid vaccines  Remind of the 3 Ws precautions of wearing masks, waiting 6 feet distances and washing hands She reports she is following covid precaution recommendation, 3 Ws [employer] gave her a new nebulizer and Duoneb Increased coughing  now with back pain with difficulty in lifting her left leg as of Monday  She called her orthopedic provider and will be seen during the end of July 2022  She also requested to be placed on the orthopedic CM office cancellation list for any  possible earlier appointment slot    Chronic back pain/sciatica Worst Pain level is a 9  Intermittent pain radiates from her back to left hip down to her left leg for last 2-3 weeks  she feels the pain initiated by increase coughing from covid Still doing PT but it makes the pain worse  She reports she was informed she is to get a foot drop brace as her ankle turns Has compression at home and wear them Discussed sock aid to used to put on compression hose  Encouraged her to ask her PT about the sock aid   Medication cost concern - Eliquis 05/31/21 reports she is in the donut hole/medicare gap r/t Eliquis 3 month supply cost-  will try to go to monthly cost, called SSI to find out she is not eligible for assistance but she was not eligible encouraged her to ask her cardiologist for possible samples  Agree to possible [place] assist if available   Use local pharmacy for diltiazem 360 mg qd  Husband on SSI also Donut hole cost issue for Eliquis 5 mg bid  Cost went to $163   referral to [facility] care guide for possible community resources to assist with off setting increasing medicines and electricity costs  Depression Received mailed [facility] letter with counseling services  Better with coping as she is walking better and is getting out of the home more Family (grand daughter [name], [name] husband and great grand children) continue to stay with her  Reports it is hard to got to more appointments Going out to her [facility] senior bingo events Not wanting to seek counseling services at this time  She takes xanax or tramadol 50 mg prn every 6 hours - xanax makes her sleep more than she wants to and she does not prefer this  Atrial fibrillation Reports she is fine without worsening signs and symptoms (s/s) reported   congestive Heart Failure (CHF) Left side swelling of her foot, some bloating on 05/30/21 has not weighed in last week Encouraged to weigh daily as ordered   Past Medical  History:  Diagnosis Date   Asthma    Chronic atrial fibrillation ([place])    a. diagnosed in 09/2016; b. failed flecainide and propafenone due to LE swelling and SOB, could not afford Multaq; c. CHADS2VASc => 5 (CHF, HTN, age x 1, nonobs CAD, female)--> Eliquis   COPD (chronic obstructive pulmonary disease) ([facility])    GERD (gastroesophageal reflux disease)    HFmrEF (heart failure with mid-range ejection fraction) ([place])    a. 12/2019 Echo: EF 40-45%.   Hyperlipidemia    Hypertension    NICM (nonischemic cardiomyopathy) ([place])    a. 12/2019 Echo: EF 40-45%, glob HK, mildly reduced RV fxn, sev dil LA. *HR 130 (afib) during study.   Nonobstructive CAD (coronary artery disease)    a. Lexiscan Myoview 10/2016: no evidence of ischemia, EF 53%; b. 02/2020 Cath: LM nl, LAD 20p, 92m, LCX 20ost, OM1/2/3 nl, LPDA nl, LPL1/2 nl, LPAV nl, RCA small, nl.   Obesity    Obstructive sleep apnea    Pulmonary hypertension ([place])    Sleep apnea    Stroke ([place])    Systolic dysfunction    a. TTE 10/2016: EF 50%, mild LVH, moderately dilated LA, moderate MR/TR, mild pulmonary hypertension    Plans [facility] RN CM will follow up with patient within the next 30-45 business days Pt encouraged to return a call to [facility] RN CM prn  Cortlynn Hollinsworth L. Lavina Hamman, RN, BSN, [place] Coordinator Office number [phone] Main [facility] number [phone] Fax number [phone]

## 2021-06-09 DIAGNOSIS — I2510 Atherosclerotic heart disease of native coronary artery without angina pectoris: Secondary | ICD-10-CM | POA: Diagnosis not present

## 2021-06-09 DIAGNOSIS — R531 Weakness: Secondary | ICD-10-CM | POA: Diagnosis not present

## 2021-06-09 DIAGNOSIS — E785 Hyperlipidemia, unspecified: Secondary | ICD-10-CM | POA: Diagnosis not present

## 2021-06-09 DIAGNOSIS — J449 Chronic obstructive pulmonary disease, unspecified: Secondary | ICD-10-CM | POA: Diagnosis not present

## 2021-06-09 DIAGNOSIS — I4891 Unspecified atrial fibrillation: Secondary | ICD-10-CM | POA: Diagnosis not present

## 2021-06-09 DIAGNOSIS — G4733 Obstructive sleep apnea (adult) (pediatric): Secondary | ICD-10-CM | POA: Diagnosis not present

## 2021-06-09 DIAGNOSIS — M6281 Muscle weakness (generalized): Secondary | ICD-10-CM | POA: Diagnosis not present

## 2021-06-09 DIAGNOSIS — Z7901 Long term (current) use of anticoagulants: Secondary | ICD-10-CM | POA: Diagnosis not present

## 2021-06-09 DIAGNOSIS — I5022 Chronic systolic (congestive) heart failure: Secondary | ICD-10-CM | POA: Diagnosis not present

## 2021-06-09 DIAGNOSIS — Z79899 Other long term (current) drug therapy: Secondary | ICD-10-CM | POA: Diagnosis not present

## 2021-06-09 DIAGNOSIS — R200 Anesthesia of skin: Secondary | ICD-10-CM | POA: Diagnosis not present

## 2021-06-09 DIAGNOSIS — R519 Headache, unspecified: Secondary | ICD-10-CM | POA: Diagnosis not present

## 2021-06-09 DIAGNOSIS — I639 Cerebral infarction, unspecified: Secondary | ICD-10-CM | POA: Diagnosis not present

## 2021-06-09 DIAGNOSIS — I110 Hypertensive heart disease with heart failure: Secondary | ICD-10-CM | POA: Diagnosis not present

## 2021-06-09 DIAGNOSIS — I10 Essential (primary) hypertension: Secondary | ICD-10-CM | POA: Diagnosis not present

## 2021-06-10 ENCOUNTER — Other Ambulatory Visit: Payer: Self-pay

## 2021-06-10 ENCOUNTER — Encounter: Payer: Self-pay | Admitting: Physician Assistant

## 2021-06-10 ENCOUNTER — Other Ambulatory Visit: Payer: Self-pay | Admitting: *Deleted

## 2021-06-10 ENCOUNTER — Telehealth: Payer: Self-pay | Admitting: Adult Health

## 2021-06-10 ENCOUNTER — Telehealth: Payer: Self-pay

## 2021-06-10 ENCOUNTER — Ambulatory Visit (INDEPENDENT_AMBULATORY_CARE_PROVIDER_SITE_OTHER): Payer: Medicare Other | Admitting: Physician Assistant

## 2021-06-10 DIAGNOSIS — I6930 Unspecified sequelae of cerebral infarction: Secondary | ICD-10-CM

## 2021-06-10 DIAGNOSIS — E785 Hyperlipidemia, unspecified: Secondary | ICD-10-CM

## 2021-06-10 DIAGNOSIS — I639 Cerebral infarction, unspecified: Secondary | ICD-10-CM

## 2021-06-10 DIAGNOSIS — R413 Other amnesia: Secondary | ICD-10-CM | POA: Diagnosis not present

## 2021-06-10 DIAGNOSIS — I482 Chronic atrial fibrillation, unspecified: Secondary | ICD-10-CM

## 2021-06-10 DIAGNOSIS — I10 Essential (primary) hypertension: Secondary | ICD-10-CM

## 2021-06-10 NOTE — Telephone Encounter (Signed)
Pt called that she forget to ask lauren about they she having difficulty sleeping as per lauren advised her to talk to neurology

## 2021-06-10 NOTE — Telephone Encounter (Signed)
If she is adamant on being seen sooner for recent admission, I do have a hospital follow-up opening on Wednesday.  In regards to an AFO brace, this would be recommended by her current physical therapist.  If they feel this is needed, they will typically reach out to our office to request order.

## 2021-06-10 NOTE — Patient Outreach (Signed)
[facility]) Care Management  06/10/2021  SORAH FALKENSTEIN 1951-09-13 [id number]  [facility] outreach to complex care patient referred by [facility] Mrs BRIEANNA NAU was referred to [facility] on 12/04/20 for EMMI stroke referral issues related questions/problems with medicines, lost interest in things they used to enjoy and symptoms of sad, hopeless, anxious, or empty was reported. After several unsuccessful outreaches, she returned a call on 12/12/20 to initiate EMMI Stroke services On 12/24/20 she reported further medication cost issues and was referred to [place] admission 11/20/20-11/23/20 Acute R MCA ischemic stroke [facility]) s/p revascularization R M1, embolic d/t known AF 04/25/38-7/67/34 enteritis and Atrial fibrillation with rapid ventricular response Insurance NextGen Medicare and Aetna     Successful outreach to home number [phone]  Patient is able to verify HIPAA ([place] and [place]) identifiers Reviewed and addressed the purpose of the follow up call with the patient   Consent: [facility] ([place]) RN CM reviewed [facility] services with patient. Patient gave verbal consent for services.   Follow up assessment Worsening neurology symptoms Started having increase neurological symptoms on 06/08/21 Saturday as she was out with family at a concert Swishing sound in head, poor swallowing causing difficulty with eating solids, facial sagging.  Trouble lifting both arms, not able to lift left weaker arm  up to shoulder level and foot dropping symptoms, urine leakage Went to [facility] ED 06/09/21 Saturday night IV CT showed stroke d/c added ASA  She reports poor interaction with office staff with attempts to be seen at MD office today for follow up vs going to ED Was able to obtain a neurology office visit for 06/12/21  Foot drop Need brace foot brace with assistance from her therapist emerge (phillip)  Diabetes Had trouble  getting blood for cbg  Discussed hydration and appetite She reports she has a good fluid intake Premier shakes  discussed   Back pain Resolving She is working on weight loss and steroid injections prn   Dental -To have crown on Thursday 06/13/21 Depression now denying need for [facility] SW referral as she is getting out more with family and church events  Plans Patient agrees to care plan and follow up within the next 30 business days Pt encouraged to return a call to [facility] RN CM prn  Goals Addressed               This Visit's Progress     Patient Stated     [facility] ) Keep or Improve My Strength-Stroke (pt-stated)   Not on track     Timeframe:  Long-Range Goal Priority:  High Start Date:                  restart 06/10/21      Expected End Date:             08/21/21           Follow up date 06/18/21 Barriers: Knowledge    - attend 3 percent of occupational therapy appointments - attend 90 percent of physical therapy appointments - eat healthy to increase strength - increase activity or exercise time a little every week - plan activity for times when energy is the highest        Notes:  06/10/21 worsening neuro symptoms with ED visit on 06/09/21 outreached to MD office for follow up office visit for 06/12/21 continue anticoagulant. Attempting to be as active as possible left side symptoms-  weaker 03/15/21 Goal previously met-        [facility]) Eat Healthy (pt-stated)   On track     Timeframe:  Short-Term Goal Priority:  Medium Start Date:             01/14/21                Expected End Date:         06/20/21              Follow Up Date 06/18/21 Barriers: Knowledge    - set goal weight - set a realistic goal     Notes:  06/10/21 reports eating better and good intake of fluids with goal to lose weight 03/15/21 Lost 4 more lbs, weight now 224 lbs and her plan is to lose 2 lbs further this week Her goal is 200 lbs near July 2022  02/14/21 reports continue diet and weight management wt  234 lbs  01/14/21 She reports she would like to get to a 180-200 lbs or size 9. She reports wanting to lose at least 40 more pounds. She is trying to take in premier [employer] L. Lavina Hamman, RN, BSN, [place] Coordinator Office number [phone] Main [facility] number [phone] Fax number [phone]

## 2021-06-10 NOTE — Telephone Encounter (Signed)
Pt states she had another stroke over the weekend on the other side of her body (it has been confirmed by Burman Nieves in referrals that Her records are in [place]) Pt is asking for a call to discuss an appointment

## 2021-06-10 NOTE — Telephone Encounter (Signed)
I called and spoke w/ pt and her caregiver. Explained Laura [name] reviewed her chart. Asked that we schedule a 4 week follow up. Scheduled appt for 07/17/21 at 8:15. Pt/caregiver expressed dissatisfaction, under impression from hospital that she follow up this week w/ neurology. Advised it is typical to follow up in 4 wks after hospital visit. She should continue medications as prescribed. Pt states is weak, has drop foot. Needing brace to be able to walk. Does not want to wait four weeks for this. She is also concerned about having another stroke. She was told not to do anything around the house, the rest. Advised I will send message to JM,NP to see if we can work something else out. We will call back. She verbalized understanding.
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
## 2021-06-10 NOTE — Telephone Encounter (Signed)
Called pt back. Scheduled sooner appt for 06/12/21 at 9:15am, check in 845am. She was able to get appt with PCP today at 2pm. She will contact PT about brace.

## 2021-06-11 ENCOUNTER — Telehealth (INDEPENDENT_AMBULATORY_CARE_PROVIDER_SITE_OTHER): Payer: Self-pay | Admitting: Nurse Practitioner

## 2021-06-11 NOTE — Telephone Encounter (Signed)
Called stating that left foot is swollen. Patient states that she recently had a mini stroke this past weekend and her PCP advised her to come in to be seen. Patients' discharge summary advised her to see her neurologist (she see neuro tomorrow 06/12/21). Patient was last seen 05/2020 Please advise.  According to [facility] last note, patient is to return in 33mos (05/2021) with same studies.    Called patient to schedule appt.

## 2021-06-12 ENCOUNTER — Ambulatory Visit (INDEPENDENT_AMBULATORY_CARE_PROVIDER_SITE_OTHER): Payer: Medicare Other | Admitting: Adult Health

## 2021-06-12 ENCOUNTER — Encounter: Payer: Self-pay | Admitting: Adult Health

## 2021-06-12 VITALS — BP 113/58 | HR 66 | Ht 64.0 in | Wt 250.0 lb

## 2021-06-12 DIAGNOSIS — I480 Paroxysmal atrial fibrillation: Secondary | ICD-10-CM

## 2021-06-12 DIAGNOSIS — I69351 Hemiplegia and hemiparesis following cerebral infarction affecting right dominant side: Secondary | ICD-10-CM

## 2021-06-12 DIAGNOSIS — I10 Essential (primary) hypertension: Secondary | ICD-10-CM | POA: Diagnosis not present

## 2021-06-12 DIAGNOSIS — I69354 Hemiplegia and hemiparesis following cerebral infarction affecting left non-dominant side: Secondary | ICD-10-CM | POA: Diagnosis not present

## 2021-06-12 DIAGNOSIS — R7303 Prediabetes: Secondary | ICD-10-CM

## 2021-06-12 DIAGNOSIS — I639 Cerebral infarction, unspecified: Secondary | ICD-10-CM | POA: Diagnosis not present

## 2021-06-12 DIAGNOSIS — R471 Dysarthria and anarthria: Secondary | ICD-10-CM | POA: Diagnosis not present

## 2021-06-12 DIAGNOSIS — E785 Hyperlipidemia, unspecified: Secondary | ICD-10-CM | POA: Diagnosis not present

## 2021-06-12 DIAGNOSIS — I69319 Unspecified symptoms and signs involving cognitive functions following cerebral infarction: Secondary | ICD-10-CM

## 2021-06-12 NOTE — Patient Instructions (Signed)
Restart therapies in [place] - you will be called to schedule for occupational and speech therapy  You will be called to schedule MRI brain  We will check lab work today  Continue aspirin 81 mg daily and Eliquis (apixaban) daily  and Crestor  for secondary stroke prevention  Continue to follow up with PCP regarding cholesterol, blood pressure and pre-diabetes management  Maintain strict control of hypertension with blood pressure goal below 130/90, diabetes with hemoglobin A1c goal below 7% and cholesterol with LDL cholesterol (bad cholesterol) goal below 70 mg/dL.       Followup in the future with me in 3 months or call earlier if needed       Thank you for coming to see [place] at [facility]. I hope we have been able to provide you high quality care today.  You may receive a patient satisfaction survey over the next few weeks. We would appreciate your feedback and comments so that we may continue to improve ourselves and the health of our patients.
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
## 2021-06-13 ENCOUNTER — Telehealth: Payer: Self-pay

## 2021-06-13 ENCOUNTER — Telehealth: Payer: Self-pay | Admitting: Adult Health

## 2021-06-13 LAB — HEMOGLOBIN A1C
Est. average glucose Bld gHb Est-mCnc: 137 mg/dL
Hgb A1c MFr Bld: 6.4 % — ABNORMAL HIGH (ref 4.8–5.6)

## 2021-06-13 LAB — LIPID PANEL
Chol/HDL Ratio: 2 ratio (ref 0.0–4.4)
Cholesterol, Total: 143 mg/dL (ref 100–199)
HDL: 73 mg/dL (ref >39)
LDL Chol Calc (NIH): 47 mg/dL (ref 0–99)
Triglycerides: 140 mg/dL (ref 0–149)
VLDL Cholesterol Cal: 23 mg/dL (ref 5–40)

## 2021-06-13 NOTE — Telephone Encounter (Signed)
-----   Message from Frann Rider, NP sent at 06/13/2021  9:28 AM EDT ----- Please advise patient that recent lipid panel satisfactory with LDL of cholesterol 47 with goal of less than 70.  A1c slightly increased at 6.4 from 6.2 -will advise her to follow-up with PCP for further discussion and possible need of diabetic treatment.  Thank you.

## 2021-06-13 NOTE — Progress Notes (Signed)
See telephone note from 06/13/21

## 2021-06-13 NOTE — Telephone Encounter (Signed)
Sent pt message via MyChart stating Hi Laura [name], I just wanted to inform you that recent lipid panel is satisfactory with LDL of cholesterol 47 with goal of less than 70.  A1c slightly increased at 6.4 from 6.2. Laura [name] advise for you to follow-up with PCP for further discussion and possible need of diabetic treatment. If you have any questions, please let us know. Have a great evening.

## 2021-06-13 NOTE — Telephone Encounter (Signed)
PT referral faxed to [facility] in [place]. Fax: [phone]. Phone: [phone].

## 2021-06-14 NOTE — Telephone Encounter (Signed)
Haley from [facility] in [place] called to report the place the order was sent to only does Physical Therapy, the order they received was for OT Hildred Alamin can be reached at [phone] if there are questions.

## 2021-06-14 NOTE — Patient Outreach (Signed)
[facility]) Care Management  06/14/2021  Laura [name] 1951-02-21 [id number]  Referral for medication assistance from Jackelyn Poling, RN sent to [place].  [name] Management Assistant [phone]

## 2021-06-17 ENCOUNTER — Telehealth: Payer: Self-pay

## 2021-06-17 ENCOUNTER — Telehealth: Payer: Self-pay | Admitting: *Deleted

## 2021-06-17 DIAGNOSIS — Z596 Low income: Secondary | ICD-10-CM

## 2021-06-17 NOTE — Telephone Encounter (Signed)
   Telephone encounter was:  Successful.  06/17/2021 Name: Laura [name] MRN: [id number] DOB: Aug 03, 1951  Laura [name] is a 70 y.o. year old female who is a primary care patient of [name], MD . The community resource team was consulted for assistance with Patient in donut hole and needs to have help after having to pay 2 top tier  medicines , patient needs food and incontinence supplies .  Care guide performed the following interventions: Patient provided with information about care guide support team and interviewed to confirm resource needs.  Follow Up Plan:  Care guide will follow up with patient by phone over the next \\day   [place], Care Management  [phone] [address] , [place] [address] Email : [email or web address]

## 2021-06-17 NOTE — Telephone Encounter (Signed)
   Telephone encounter was:  Unsuccessful.  06/17/2021 Name: Laura [name] MRN: [id number] DOB: 08/18/1951  Unsuccessful outbound call made today to assist with:  Food Insecurity and rx assistance  Outreach Attempt:  1st Attempt  A HIPAA compliant voice message was left requesting a return call.  Instructed patient to call back at   Instructed patient to call back at [phone]  at their earliest convenience.   [place], Care Management  [phone] [address] , [place] [address] Email : [email or web address]

## 2021-06-17 NOTE — Progress Notes (Signed)
[facility])  [place] Team    06/17/2021  Laura [name] [birth year] [id number]  Reason for referral: Medication Assistance   Outreach:  Successful telephone call with Laura [name].  Called patient and was unable to reach; second attempt. Therefore, HIPAA identifiers were not verified.   Subjective:  Brief note. I called Laura [name] Friday, when the referral was initially placed and today; both times I was unable to reach the patient was left a voicemail. I will call again tomorrow. Per chart review, patient needs help affording several medications. Depending on her income and OOP expense of prescriptions, should may be eligible for Eliquis PAP and Heart Failure - [place].   Assessment/Plan: Will try last attempt for patient outreach tomorrow.

## 2021-06-18 ENCOUNTER — Other Ambulatory Visit: Payer: Self-pay | Admitting: *Deleted

## 2021-06-18 ENCOUNTER — Telehealth: Payer: Self-pay | Admitting: *Deleted

## 2021-06-18 ENCOUNTER — Telehealth: Payer: Self-pay

## 2021-06-18 ENCOUNTER — Other Ambulatory Visit: Payer: Self-pay

## 2021-06-18 DIAGNOSIS — Z596 Low income: Secondary | ICD-10-CM

## 2021-06-18 NOTE — Patient Outreach (Signed)
[facility]) Care Management  06/18/2021  MACLAINE AHOLA [birth year] [id number]   [facility] outreach from complex care patient referred by [facility] Mrs HEBA IGE was referred to [facility] on 12/04/20 for EMMI stroke referral issues related questions/problems with medicines, lost interest in things they used to enjoy and symptoms of sad, hopeless, anxious, or empty was reported. After several unsuccessful outreaches, she returned a call on 12/12/20 to initiate EMMI Stroke services On 12/24/20 she reported further medication cost issues and was referred to [facility] admission 11/20/20-11/23/20 Acute R MCA ischemic stroke [facility]) s/p revascularization R M1, embolic d/t known AF 12/22/73-12/23/56 enteritis and Atrial fibrillation with rapid ventricular response Insurance [employer] Medicare and [employer]     Successful outreach from [phone]  Patient is able to verify HIPAA ([name] [address]) identifiers Reviewed and addressed the purpose of the follow up call with the patient   Consent: [facility] RN CM reviewed [facility] services with patient. Patient gave verbal consent for services.   Follow up assessment Mrs Leamy outreach to [facility] RN CM at 1230 prior to her scheduled 1400 outreach  She informs [facility] RN CM she is on her way to an emergency dental crown procedure at 1300 today  She reports she had contact from [place] and has been provided resources and is pending outreach from [place] staff [facility] care guide interventions include a gift card from [facility] and also the information sent to her in the mail Some information has been gathered by [facility] and depending on her income and OOP expense of prescriptions, should may be eligible for Eliquis PAP and Heart Failure - [place]. Since she is a resident of [place], she may qualify for their program  - PAP and some meds are free. [facility] pharmacy will need more information before  reaching backout to the coordinator Alm Bustard. She voices gratitude for [employer], resources and services offered   [facility] RN CM collaborated with [facility] care guide and [place] via [place] in [place] RN CM will outreach to patient within the next 7-10 business days Pt encouraged to return a call to [employer] CM prn    Lovell Roe L. Lavina Hamman, RN, BSN, [place] Coordinator Office number [phone] [facility] number [phone] Fax number [phone]

## 2021-06-18 NOTE — Telephone Encounter (Signed)
   Telephone encounter was:  Successful.  06/18/2021 Name: JAMEELAH WATTS MRN: [id number] DOB: [birth year]  SHANDALE MALAK is a 70 y.o. year old female who is a primary care patient of Humphrey Rolls Timoteo Gaul, MD . The community resource team was consulted for assistance with Transportation Needs , Food Insecurity, and Caregiver Stress  Care guide performed the following interventions: Patient provided with information about care guide support team and interviewed to confirm resource needs.Talked to patient about the gift card from [facility] and also the information I was sending her in the mail and also to be on the lookout for a call from pharmacist.  Follow Up Plan:  No further follow up planned at this time. The patient has been provided with needed resources. [place], Care Management  [phone] [address] , [place] [id number] Email : [email or web address]

## 2021-06-18 NOTE — Progress Notes (Signed)
Talked to patient about the gift card from [facility] and also the information I was sending her in the mail and also to be on the lookout for a call from pharmacist.

## 2021-06-18 NOTE — Progress Notes (Signed)
[facility])  [place] Team    06/18/2021  Laura [name] Jun 11, 1951 [id number]  Reason for referral: Medication Assistance  Referral source:  [place] Management RN Current insurance:  [facility] Supplemental and Medicare Part A&B  PMHx includes but not limited to: Laura [name] has a PMH significant for chronic systolic heart failure, atrial fibrillation, peripheral artery disease, ischemic stroke (recurrent; most recent 05/2021), essential hypertension.  Outreach:  Successful telephone call with Laura [name].  HIPAA identifiers verified.   Subjective:  I briefly spoke with this patient regarding household size and income as well as resources to help afford medication. This patient was not at home when I called and was expected to be home shortly. She was able to answer a few  questions. I was supposed to follow up, again, later today around 3:30 pm - 4 pm. I called back and was unable to reach her.   Patient reported that she lives with her husband, daughter, and granddaughter but they are not dependents. Per taxes, she file only two people liver in her household. She and her husband makes $3600/month ($43200/year). She was unable to confirm her OOP prescription expenditure but would follow back up with that information. It seems Eliquis and Diltiazem CD are the most expensive; I was unable to review the cost of other medications. Currently, patient uses [employer] [facility] for Diltiazem and it cost $40 for a 90D/s  and fills Eliquis at [facility] for $162/ 14-monthsupply. At this time, she has enough of Eliquis and does not need samples.  Financial resources discussed:  [facility] - informed patient she would likely need to transfer some of her medications there. After talking with this patient, I reached out to BAlm Bustardwho requests additional information, such as current medication list, to see what she qualifies with the  services they offer.  Heart Failure Pan-Foundation -  I was not able fill out application with this patient.  Eliquis PAP     Objective: The ASCVD Risk score (Mikey BussingDC Jr., et al., 2013) failed to calculate for the following reasons:   The patient has a prior MI or stroke diagnosis  Lab Results  Component Value Date   CREATININE 0.69 03/18/2021   CREATININE 0.80 02/26/2021   CREATININE 0.85 12/04/2020    Lab Results  Component Value Date   HGBA1C 6.4 (H) 06/12/2021    Lipid Panel     Component Value Date/Time   CHOL 143 06/12/2021 1028   TRIG 140 06/12/2021 1028   HDL 73 06/12/2021 1028   CHOLHDL 2.0 06/12/2021 1028   CHOLHDL 3.3 11/21/2020 0616   VLDL 25 11/21/2020 0616   LDLCALC 47 06/12/2021 1028    BP Readings from Last 3 Encounters:  06/12/21 (!) 113/58  06/10/21 122/60  04/03/21 121/70    Allergies  Allergen Reactions   Flecainide Shortness Of Breath   Metoprolol Shortness Of Breath, Swelling and Other (See Comments)    "My limbs swell" also   Propafenone Shortness Of Breath, Swelling and Other (See Comments)    "My limbs swell" also   Rivaroxaban Swelling and Other (See Comments)    Xarelto- "My limbs swell"    Medications Reviewed Today     Reviewed by MFrann Rider NP (Nurse Practitioner) on 06/12/21 at 1032  Med List Status: <None>   Medication Order Taking? Sig Documenting Provider Last Dose Status Informant  Accu-Chek Softclix Lancets lancets [id number] Use as instructed to  check blood sugars daily 2 hours after meal [name], MD Taking Active Multiple Informants  Accu-Chek Softclix Lancets lancets [id number] Yes Accu-Chek Softclix Lancets  USE AS INSTRUCTED TO CHECK BLOOD SUGARS DAILY 2 HOURS AFTER MEAL [provider] Taking Active   Acetaminophen-Codeine 300-30 MG tablet [id number] Yes Take 1 tablet by mouth every 6 (six) hours. [provider] Taking Active   albuterol (VENTOLIN HFA) 108 (90 Base) MCG/ACT inhaler  [id number] Yes Inhale 2 puffs into the lungs every 4 (four) hours as needed for wheezing or shortness of breath. Allyne Gee, MD Taking Active   ALPRAZolam Duanne Moron) 0.25 MG tablet [id number] Yes Take 0.25 mg by mouth 2 (two) times daily as needed for anxiety. [provider] Taking Active   apixaban (ELIQUIS) 5 MG TABS tablet [id number] Yes Take 1 tablet (5 mg total) by mouth 2 (two) times daily. Donzetta Starch, NP Taking Active   ASPIRIN 81 PO [id number] Yes Take by mouth. [provider] Taking Active   baclofen (LIORESAL) 10 MG tablet [id number] Yes Take 10 mg by mouth 3 (three) times daily as needed. [provider] Taking Active   benzonatate (TESSALON) 100 MG capsule [id number] Yes Take 1 capsule (100 mg total) by mouth 2 (two) times daily as needed for cough. [name], MD Taking Active   diltiazem (CARDIZEM CD) 360 MG 24 hr capsule [id number] Yes Take 1 capsule (360 mg total) by mouth daily. Loel Dubonnet, NP Taking Active   EPINEPHrine 0.3 mg/0.3 mL IJ SOAJ injection [id number] Yes Inject 0.3 mg into the muscle as needed for anaphylaxis.  [provider] Taking Active Multiple Informants  fluticasone (FLONASE) 50 MCG/ACT nasal spray [id number] Yes Place into both nostrils daily. [provider] Taking Active   furosemide (LASIX) 20 MG tablet [id number] Yes Take 1 tablet (20 mg total) by mouth daily. Loel Dubonnet, NP Taking Active   gabapentin (NEURONTIN) 300 MG capsule [id number] Yes Take by mouth 3 (three) times daily. [provider] Taking Active   glucose blood (ACCU-CHEK GUIDE) test strip [id number] Yes Use as instructed to check blood sugars daily 2 hours after meal DX E11.65 [name], MD Taking Active   ipratropium-albuterol (DUONEB) 0.5-2.5 (3) MG/3ML Bailey Mech [id number] Yes Use every 6 hours and as needed [name], MD Taking Active   loperamide (IMODIUM) 2 MG capsule [id number] Yes Take 4 mg by mouth as needed for diarrhea or  loose stools. [provider] Taking Active Multiple Informants  losartan (COZAAR) 25 MG tablet [id number] Yes Take 1 tablet (25 mg total) by mouth daily. [name], MD Taking Active   montelukast (SINGULAIR) 10 MG tablet [id number] Yes Take 1 tablet (10 mg total) by mouth daily. [name], MD Taking Active   omeprazole (PRILOSEC) 40 MG capsule [id number] Yes TAKE 1 CAPSULE BY MOUTH EVERY DAY [name], MD Taking Active   ondansetron [facility]) 4 MG tablet [id number] Yes Take 1 tablet (4 mg total) by mouth every 8 (eight) hours as needed for nausea or vomiting. [name], MD Taking Active Multiple Informants  predniSONE (DELTASONE) 10 MG tablet [id number] Yes TAKE 1 TABLET (10 MG TOTAL) BY MOUTH DAILY WITH BREAKFAST. [name], MD Taking Active   Probiotic Product (PROBIOTIC-10) CAPS [id number] Yes Take 1 capsule by mouth daily.  [provider] Taking Active Multiple Informants  promethazine (PHENERGAN) 12.5 MG tablet [id number] Yes TAKE 1 TABLET (12.5 MG TOTAL) BY MOUTH EVERY 12 (TWELVE)  HOURS AS NEEDED FOR NAUSEA OR VOMITING. Luiz Ochoa, NP Taking Active   rosuvastatin (CRESTOR) 20 MG tablet [id number] Yes Take 1 tablet (20 mg total) by mouth daily. Loel Dubonnet, NP Taking Active   spironolactone (ALDACTONE) 25 MG tablet [id number] Yes TAKE 1 TABLET (25 MG TOTAL) BY MOUTH DAILY. Loel Dubonnet, NP Taking Active   THEO-24 100 MG 24 hr capsule [id number] Yes TAKE 1 CAPSULE BY MOUTH EVERY DAY Luiz Ochoa, NP Taking Active             Assessment: Based on my discussion with the patient she could qualify for Eliquis PAP pending a combined  3% OOP expense of prescriptions (patient and her spouse).   Medication Review Findings:  Need to review current medications to see what she qualifies for at [facility] MAP    Medication Assistance Findings:  Medication assistance needs identified:  [facility] Medication Assistance Program  Heart Failure Pan-Foundation -  qualifying medications: losartan, spironolactone, and furosemide Eliquis PAP   Extra Help:  Not eligible for Extra Help Low Income Subsidy based on reported income and assets  Patient Assistance Programs: Eliquis made by [place] requirement met: Yes Out-of-pocket prescription expenditure met:   Unknown Reviewed program requirements with patient.     Additional medication assistance options reviewed with patient as warranted:  Foundation programs, [employer]  Plan: I will reach out to patient again tomorrow to review current medications to update Alm Bustard, [employer] application, and follow up with [place] MAP.  I will need to obtain OOP information and cost of prescriptions  Thank you for allowing pharmacy to be a part of this patient's care.  Kristeen Miss, PharmD Clinical Pharmacist [place] Cell: [phone]

## 2021-06-19 ENCOUNTER — Ambulatory Visit
Admission: RE | Admit: 2021-06-19 | Discharge: 2021-06-19 | Disposition: A | Discharge status: Home / Self Care | Payer: Medicare Other | Source: Ambulatory Visit | Attending: Adult Health | Admitting: Adult Health

## 2021-06-19 DIAGNOSIS — I639 Cerebral infarction, unspecified: Secondary | ICD-10-CM | POA: Diagnosis not present

## 2021-06-19 DIAGNOSIS — I63319 Cerebral infarction due to thrombosis of unspecified middle cerebral artery: Secondary | ICD-10-CM | POA: Diagnosis not present

## 2021-06-19 NOTE — Telephone Encounter (Signed)
Sent referral via Epic to [facility] outpatient rehab. Phone: [phone].

## 2021-06-20 ENCOUNTER — Other Ambulatory Visit: Payer: Self-pay | Admitting: *Deleted

## 2021-06-20 ENCOUNTER — Telehealth: Payer: Self-pay

## 2021-06-20 ENCOUNTER — Ambulatory Visit (INDEPENDENT_AMBULATORY_CARE_PROVIDER_SITE_OTHER): Payer: Medicare Other | Admitting: Cardiovascular Disease

## 2021-06-20 ENCOUNTER — Telehealth: Payer: Self-pay | Admitting: Pharmacy Technician

## 2021-06-20 ENCOUNTER — Other Ambulatory Visit: Payer: Self-pay

## 2021-06-20 ENCOUNTER — Encounter: Payer: Self-pay | Admitting: Cardiovascular Disease

## 2021-06-20 VITALS — BP 130/70 | HR 89 | Ht 64.0 in | Wt 247.2 lb

## 2021-06-20 DIAGNOSIS — I5022 Chronic systolic (congestive) heart failure: Secondary | ICD-10-CM

## 2021-06-20 DIAGNOSIS — R600 Localized edema: Secondary | ICD-10-CM | POA: Diagnosis not present

## 2021-06-20 DIAGNOSIS — I63511 Cerebral infarction due to unspecified occlusion or stenosis of right middle cerebral artery: Secondary | ICD-10-CM

## 2021-06-20 DIAGNOSIS — I482 Chronic atrial fibrillation, unspecified: Secondary | ICD-10-CM

## 2021-06-20 DIAGNOSIS — G4730 Sleep apnea, unspecified: Secondary | ICD-10-CM | POA: Diagnosis not present

## 2021-06-20 DIAGNOSIS — I10 Essential (primary) hypertension: Secondary | ICD-10-CM

## 2021-06-20 DIAGNOSIS — Z8673 Personal history of transient ischemic attack (TIA), and cerebral infarction without residual deficits: Secondary | ICD-10-CM

## 2021-06-20 DIAGNOSIS — I2510 Atherosclerotic heart disease of native coronary artery without angina pectoris: Secondary | ICD-10-CM

## 2021-06-20 DIAGNOSIS — Z596 Low income: Secondary | ICD-10-CM

## 2021-06-20 MED ORDER — FUROSEMIDE 40 MG PO TABS: 40.0000 mg | ORAL_TABLET | Freq: Every day | ORAL | 5 refills | Status: DC

## 2021-06-20 NOTE — Progress Notes (Signed)
Cardiology Office Note   Date:  06/20/2021   ID:  Laura [name] 1951/04/27, MRN [id number]  PCP:  [name], MD  Cardiologist:   Kathlyn Sacramento, MD   Chief Complaint  Patient presents with   Other    3 month f/u c/o sob and edema left ankle. Meds reviewed verbally with pt.       History of Present Illness: Laura [name] is a 70 y.o. female who is here today for a follow-up visit regarding chronic atrial fibrillation and chronic systolic heart failure.  The patient has chronic medical conditions that include obesity, sleep apnea on CPAP, asthma, chronic systolic heart failure due to nonischemic cardiomyopathy, nonobstructive CAD, CVA and essential hypertension. She was diagnosed with atrial fibrillation in October 2017.She did not tolerate multiple antiarrhythmic medications and has been treated with rate control. She was hospitalized in September of 2020 with ischemic colitis.  Angiogram showed no acute obstructive disease.  She underwent ileum and right colon resection.    She did not tolerate metoprolol due to increased dyspnea.  She underwent a right and left cardiac catheterization in March 2021 to evaluate pulmonary hypertension and exertional dyspnea.  It showed mild nonobstructive coronary artery disease.  Right heart catheterization showed moderately elevated filling pressures with mild pulmonary hypertension and normal cardiac output.  Her pulmonary wedge pressure at that time was 24 mmHg.  Furosemide was resumed at that time.  She was hospitalized in November of 2021 with acute right MCA stroke in the setting of being off anticoagulation for lumbar injection.  Echocardiogram during that admission showed an EF of 45 to 50%. She was seen in the ED in December with dehydration.  She had COVID-19 in December but recovered.  She had a recent emergency room visit for increased headache and was told about a possible TIA.  Aspirin was added to Eliquis.  Over the last  few months, he noticed worsening leg edema and she gained 14 pounds in 2 months.  She takes furosemide 20 mg once daily.  She used to be on 40 mg once daily but it was decreased in the setting of diarrhea.  Past Medical History:  Diagnosis Date   Asthma    Chronic atrial fibrillation ([place])    a. diagnosed in 09/2016; b. failed flecainide and propafenone due to LE swelling and SOB, could not afford Multaq; c. CHADS2VASc => 5 (CHF, HTN, age x 1, nonobs CAD, female)--> Eliquis   COPD (chronic obstructive pulmonary disease) (HCC)    GERD (gastroesophageal reflux disease)    HFmrEF (heart failure with mid-range ejection fraction) ([place])    a. 12/2019 Echo: EF 40-45%.   Hyperlipidemia    Hypertension    NICM (nonischemic cardiomyopathy) ([place])    a. 12/2019 Echo: EF 40-45%, glob HK, mildly reduced RV fxn, sev dil LA. *HR 130 (afib) during study.   Nonobstructive CAD (coronary artery disease)    a. Lexiscan Myoview 10/2016: no evidence of ischemia, EF 53%; b. 02/2020 Cath: LM nl, LAD 20p, 38m, LCX 20ost, OM1/2/3 nl, LPDA nl, LPL1/2 nl, LPAV nl, RCA small, nl.   Obesity    Obstructive sleep apnea    Pulmonary hypertension ([place])    Sleep apnea    Stroke ([place])    Systolic dysfunction    a. TTE 10/2016: EF 50%, mild LVH, moderately dilated LA, moderate MR/TR, mild pulmonary hypertension    Past Surgical History:  Procedure Laterality Date   BOWEL RESECTION  09/11/2019  Procedure: SMALL BOWEL RESECTION;  Surgeon: Herbert Pun, MD;  Location: [facility] ORS;  Service: General;;   CARDIAC CATHETERIZATION     cataract surgery     COLONOSCOPY WITH PROPOFOL N/A 03/19/2020   Procedure: COLONOSCOPY WITH PROPOFOL;  Surgeon: [name], MD;  Location: [facility] ENDOSCOPY;  Service: Gastroenterology;  Laterality: N/A;   CORONARY ANGIOPLASTY     INCISION AND DRAINAGE ABSCESS Right 06/29/2016   Procedure: INCISION AND DRAINAGE ABSCESS;  Surgeon: Florene Glen, MD;  Location: [facility] ORS;  Service: General;   Laterality: Right;   INCISION AND DRAINAGE OF WOUND Left 06/29/2016   Procedure: IRRIGATION AND DEBRIDEMENT WOUND;  Surgeon: Florene Glen, MD;  Location: [facility] ORS;  Service: General;  Laterality: Left;   IR ANGIO VERTEBRAL SEL SUBCLAVIAN INNOMINATE UNI R MOD SED  11/20/2020   IR CT HEAD LTD  11/20/2020   IR PERCUTANEOUS ART THROMBECTOMY/INFUSION INTRACRANIAL INC DIAG ANGIO  11/20/2020   LAPAROSCOPIC RIGHT COLECTOMY  09/11/2019   Procedure: RIGHT COLECTOMY;  Surgeon: Herbert Pun, MD;  Location: [facility] ORS;  Service: General;;   LAPAROSCOPY N/A 09/11/2019   Procedure: LAPAROSCOPY DIAGNOSTIC;  Surgeon: Herbert Pun, MD;  Location: [facility] ORS;  Service: General;  Laterality: N/A;   LAPAROTOMY N/A 09/13/2019   Procedure: REOPENING OF RECENT LAPAROTOMYANASTOMOSIS OF BOWEL;  Surgeon: Herbert Pun, MD;  Location: [facility] ORS;  Service: General;  Laterality: N/A;   RADIOLOGY WITH ANESTHESIA N/A 11/20/2020   Procedure: IR WITH ANESTHESIA - CODE STROKE;  Surgeon: Radiologist, Medication, MD;  Location: [place];  Service: Radiology;  Laterality: N/A;   RIGHT/LEFT HEART CATH AND CORONARY ANGIOGRAPHY Bilateral 02/27/2020   Procedure: RIGHT/LEFT HEART CATH AND CORONARY ANGIOGRAPHY;  Surgeon: Wellington Hampshire, MD;  Location: [place] [facility];  Service: Cardiovascular;  Laterality: Bilateral;   VISCERAL ANGIOGRAPHY N/A 09/12/2019   Procedure: VISCERAL ANGIOGRAPHY;  Surgeon: Algernon Huxley, MD;  Location: [place] [facility];  Service: Cardiovascular;  Laterality: N/A;     Current Outpatient Medications  Medication Sig Dispense Refill   Accu-Chek Softclix Lancets lancets Accu-Chek Softclix Lancets  USE AS INSTRUCTED TO CHECK BLOOD SUGARS DAILY 2 HOURS AFTER MEAL     albuterol (VENTOLIN HFA) 108 (90 Base) MCG/ACT inhaler Inhale 2 puffs into the lungs every 4 (four) hours as needed for wheezing or shortness of breath. 18 g 3   ALPRAZolam (XANAX) 0.25 MG tablet Take 0.25 mg by mouth 2 (two)  times daily as needed for anxiety.     apixaban (ELIQUIS) 5 MG TABS tablet Take 1 tablet (5 mg total) by mouth 2 (two) times daily. 60 tablet 6   ASPIRIN 81 PO Take by mouth.     baclofen (LIORESAL) 10 MG tablet Take 10 mg by mouth 3 (three) times daily as needed.     benzonatate (TESSALON) 100 MG capsule Take 1 capsule (100 mg total) by mouth 2 (two) times daily as needed for cough. 20 capsule 2   diltiazem (CARDIZEM CD) 360 MG 24 hr capsule Take 1 capsule (360 mg total) by mouth daily. 90 capsule 2   EPINEPHrine 0.3 mg/0.3 mL IJ SOAJ injection Inject 0.3 mg into the muscle as needed for anaphylaxis.      fluticasone (FLONASE) 50 MCG/ACT nasal spray Place into both nostrils daily.     furosemide (LASIX) 20 MG tablet Take 1 tablet (20 mg total) by mouth daily. 30 tablet 5   gabapentin (NEURONTIN) 300 MG capsule Take by mouth 3 (three) times daily.  glucose blood (ACCU-CHEK GUIDE) test strip Use as instructed to check blood sugars daily 2 hours after meal DX E11.65 100 each 12   ipratropium-albuterol (DUONEB) 0.5-2.5 (3) MG/3ML SOLN Use every 6 hours and as needed 450 mL 12   loperamide (IMODIUM) 2 MG capsule Take 4 mg by mouth as needed for diarrhea or loose stools.     losartan (COZAAR) 25 MG tablet Take 1 tablet (25 mg total) by mouth daily. 90 tablet 1   montelukast (SINGULAIR) 10 MG tablet Take 1 tablet (10 mg total) by mouth daily. 90 tablet 1   omeprazole (PRILOSEC) 40 MG capsule TAKE 1 CAPSULE BY MOUTH EVERY DAY 90 capsule 0   ondansetron (ZOFRAN) 4 MG tablet Take 1 tablet (4 mg total) by mouth every 8 (eight) hours as needed for nausea or vomiting. 60 tablet 1   predniSONE (DELTASONE) 10 MG tablet TAKE 1 TABLET (10 MG TOTAL) BY MOUTH DAILY WITH BREAKFAST. 30 tablet 1   Probiotic Product (PROBIOTIC-10) CAPS Take 1 capsule by mouth daily.      promethazine (PHENERGAN) 12.5 MG tablet TAKE 1 TABLET (12.5 MG TOTAL) BY MOUTH EVERY 12 (TWELVE) HOURS AS NEEDED FOR NAUSEA OR VOMITING. 30 tablet  0   rosuvastatin (CRESTOR) 20 MG tablet Take 1 tablet (20 mg total) by mouth daily. 90 tablet 3   spironolactone (ALDACTONE) 25 MG tablet TAKE 1 TABLET (25 MG TOTAL) BY MOUTH DAILY. 90 tablet 0   THEO-24 100 MG 24 hr capsule TAKE 1 CAPSULE BY MOUTH EVERY DAY 30 capsule 3   No current facility-administered medications for this visit.    Allergies:   Flecainide, Metoprolol, Propafenone, and Rivaroxaban    Social History:  The patient  reports that she has never smoked. She has never used smokeless tobacco. She reports that she does not drink alcohol and does not use drugs.   Family History:  The patient's family history includes COPD in her father; Cancer in her daughter; Dementia in her mother; Heart disease in her brother; Osteoporosis in her mother; Vascular Disease in her mother.    ROS:  Please see the history of present illness.   Otherwise, review of systems are positive for none.   All other systems are reviewed and negative.    PHYSICAL EXAM: VS:  BP 130/70 (BP Location: Left Arm, Patient Position: Sitting, Cuff Size: Large)   Pulse 89   Ht 5\' 4"  (1.626 m)   Wt 247 lb 4 oz (112.2 kg)   LMP  (LMP Unknown)   SpO2 98%   BMI 42.44 kg/m  , BMI Body mass index is 42.44 kg/m. GEN: Well nourished, well developed, in no acute distress  HEENT: normal  Neck: no JVD, carotid bruits, or masses Cardiac: Irregularly irregular ; no murmurs, rubs, or gallops, +2 edema Respiratory:  clear to auscultation bilaterally, normal work of breathing GI: soft, nontender, nondistended, + BS MS: no deformity or atrophy  Skin: warm and dry, no rash Neuro:  Strength and sensation are intact Psych: euthymic mood, full affect Radial pulses normal   EKG:  EKG is ordered today. The ekg ordered today demonstrates atrial fibrillation with a PVC and nonspecific ST and T wave changes.   Recent Labs: 12/01/2020: Hemoglobin 15.7; Platelets 379 03/18/2021: ALT 22; BUN 16; Creatinine, Ser 0.69; Potassium  4.3; Sodium 138    Lipid Panel    Component Value Date/Time   CHOL 143 06/12/2021 1028   TRIG 140 06/12/2021 1028   HDL 73 06/12/2021 1028  CHOLHDL 2.0 06/12/2021 1028   CHOLHDL 3.3 11/21/2020 0616   VLDL 25 11/21/2020 0616   LDLCALC 47 06/12/2021 1028      Wt Readings from Last 3 Encounters:  06/20/21 247 lb 4 oz (112.2 kg)  06/12/21 250 lb (113.4 kg)  06/10/21 244 lb 9.6 oz (110.9 kg)      PAD Screen 04/03/2017  Previous PAD dx? No  Previous surgical procedure? No  Pain with walking? No  Feet/toe relief with dangling? No  Painful, non-healing ulcers? No  Extremities discolored? No      ASSESSMENT AND PLAN:  1.  Nonobstructive coronary artery disease: No anginal symptoms.  Continue medical therapy.  2.  Chronic atrial fibrillation: Digoxin was discontinued during last visit.  Ventricular rate is reasonably controlled.  I asked her to continue diltiazem at the current dose.  Continue anticoagulation with Eliquis without interruption.  3. Sleep apnea: Continue treatment with CPAP.  4. Essential hypertension: Blood pressure is well controlled.  5.  Chronic systolic heart failure: Mildly reduced LV systolic function.  She did not tolerate beta-blockers due to worsening dyspnea.  She appears to be volume overloaded and had a 14 pound weight gain over the last 2 months with significant leg edema.  I increased furosemide to 40 mg once daily.  Check basic metabolic profile in 1 week.  Continue spironolactone and losartan.  6.  History of CVA: Embolic in the setting of being off Eliquis.  Any future interruption of Eliquis will require bridging with low molecular weight heparin.  7.  Hyperlipidemia: I reviewed recent lipid profile which showed an LDL of 47 and triglyceride of 147.  Both at target.  Instructed her to continue rosuvastatin 20 mg daily.   Disposition:   FU with me in  6 months. Signed,  Kathlyn Sacramento, MD  06/20/2021 8:58 AM    [facility]  HeartCare

## 2021-06-20 NOTE — Telephone Encounter (Signed)
Provided patient with new patient packet to obtain [facility]ication Management [facility] services.  [facility] must receive requested financial documentation within 30 days in order to determine eligibility and provide medication assistance.  [place] [facility]

## 2021-06-20 NOTE — Patient Instructions (Signed)
Medication Instructions:  Your physician has recommended you make the following change in your medication:  INCREASE Lasix to 40 mg daily. An Rx has been sent to your pharmacy.  *If you need a refill on your cardiac medications before your next appointment, please call your pharmacy*   Lab Work: Your physician recommends that you return for lab work (bmp) in: 1 week  Please have your lab drawn at the [place]. No appt needed. Lab hours Mon-Fri 7:30am-5:30pm.  If you have labs (blood work) drawn today and your tests are completely normal, you will receive your results only by: [place] (if you have MyChart) OR A paper copy in the mail If you have any lab test that is abnormal or we need to change your treatment, we will call you to review the results.   Testing/Procedures: None ordered   Follow-Up: At [facility], you and your health needs are our priority.  As part of our continuing mission to provide you with exceptional heart care, we have created designated Provider Care Teams.  These Care Teams include your primary Cardiologist (physician) and Advanced Practice Providers (APPs -  Physician Assistants and Nurse Practitioners) who all work together to provide you with the care you need, when you need it.  We recommend signing up for the patient portal called "MyChart".  Sign up information is provided on this After Visit Summary.  MyChart is used to connect with patients for Virtual Visits (Telemedicine).  Patients are able to view lab/test results, encounter notes, upcoming appointments, etc.  Non-urgent messages can be sent to your provider as well.   To learn more about what you can do with MyChart, go to [email or web address].    Your next appointment:   Your physician wants you to follow-up in: 6 months You will receive a reminder letter in the mail two months in advance. If you don't receive a letter, please call our office to schedule the follow-up  appointment.   The format for your next appointment:   In Person  Provider:   You may see Kathlyn Sacramento, MD or one of the following Advanced Practice Providers on your designated Care Team:   Murray Hodgkins, NP Christell Faith, PA-C Marrianne Mood, PA-C Cadence Heath, [place] Laurann Montana, NP   Other Instructions N/A

## 2021-06-20 NOTE — Patient Outreach (Addendum)
[name]  06/20/2021  Laura [name] June 02, 1951 [id number]   [place] coordination- collaboration with pharmacy   Outreach from and to [place] to attempt to provide/coordinate pharmacy services for patient During last outreach patient was aware of need to return call to pharmacy staff Unsuccessful outreaches x 3 has been made   [facility] RN CM outreached to patient  No answer. [facility] RN CM left HIPAA HIPAA [facility] Portability and Accountability Act) compliant voicemail message along with CM's contact info. Requested return call to each pharmacy staff Left available staff numbers   [facility] RN CM will outreach to patient within the next 7-10 business days If patient outreaches to RN CM, will coordinate a conference call to pharmacy staff  [place]. Lavina Hamman, RN, BSN, [place] Coordinator Office number [phone] Mobile number [phone]  Main [facility] number [phone] Fax number [phone]

## 2021-06-21 ENCOUNTER — Ambulatory Visit (INDEPENDENT_AMBULATORY_CARE_PROVIDER_SITE_OTHER): Payer: Medicare Other | Admitting: Nurse Practitioner

## 2021-06-21 ENCOUNTER — Other Ambulatory Visit (INDEPENDENT_AMBULATORY_CARE_PROVIDER_SITE_OTHER): Payer: Self-pay | Admitting: Vascular Surgery

## 2021-06-21 ENCOUNTER — Telehealth: Payer: Self-pay

## 2021-06-21 ENCOUNTER — Encounter (INDEPENDENT_AMBULATORY_CARE_PROVIDER_SITE_OTHER): Payer: Self-pay | Admitting: Nurse Practitioner

## 2021-06-21 ENCOUNTER — Ambulatory Visit (INDEPENDENT_AMBULATORY_CARE_PROVIDER_SITE_OTHER): Payer: Medicare Other

## 2021-06-21 VITALS — BP 105/62 | HR 91 | Resp 16 | Wt 248.0 lb

## 2021-06-21 DIAGNOSIS — I739 Peripheral vascular disease, unspecified: Secondary | ICD-10-CM

## 2021-06-21 DIAGNOSIS — I6529 Occlusion and stenosis of unspecified carotid artery: Secondary | ICD-10-CM

## 2021-06-21 DIAGNOSIS — I10 Essential (primary) hypertension: Secondary | ICD-10-CM | POA: Diagnosis not present

## 2021-06-21 DIAGNOSIS — Z596 Low income: Secondary | ICD-10-CM

## 2021-06-21 NOTE — Telephone Encounter (Signed)
Called pt and informed her that [employer] brought [place] a gift card for her for pt assistance and she can pick it up at front desk.

## 2021-06-21 NOTE — Progress Notes (Signed)
[facility]) [place] Team      06/21/2021   Laura [name] 1951-01-10 [id number]   Reason for referral: Medication Assistance   Referral source:  [place] Management RN Current insurance:  [facility] Supplemental and Medicare Part A&B   PMHx includes but not limited to: Laura [name] has a PMH significant for chronic systolic heart failure, atrial fibrillation, peripheral artery disease, ischemic stroke (recurrent; most recent 05/2021), essential hypertension.   Outreach:  Unsuccessful telephone call with [name].  I called again today, but was unable to reach her and LVM. I have made several attempts to outreach. Yesterday, I sent a secure message to Joellyn Quails re: several unsuccessful attempts. Joellyn Quails attempted to outreach patient yesterday but was not able to get in contact with her. Additionally, I have worked with Joellyn Quails this week to try to help facilitate a successful outreach with this patient.  At this time, I will close this consult for medication assistance with [facility] pharmacy due to multiple failed patient outreaches.   Thank you for allowing pharmacy to be a part of this patient's care. Kristeen Miss, PharmD Clinical Pharmacist [place] Cell: [phone]

## 2021-06-21 NOTE — Progress Notes (Signed)
Error documenting

## 2021-06-21 NOTE — Progress Notes (Signed)
I agree with the above plan 

## 2021-06-25 ENCOUNTER — Encounter: Payer: Self-pay | Admitting: Adult Health

## 2021-06-25 ENCOUNTER — Telehealth: Payer: Self-pay | Admitting: Adult Health

## 2021-06-25 ENCOUNTER — Telehealth: Payer: Self-pay

## 2021-06-25 NOTE — Telephone Encounter (Signed)
-----   Message from Frann Rider, NP sent at 06/25/2021 12:15 PM EDT ----- Please advise patient that recent MRI did not show any evidence of new stroke since prior MRI in December.  Previously reported possible left-sided stroke on CT scan not seen on MRI.  No further work-up needed at this time.  Please continue current regimen.  Thank you.

## 2021-06-25 NOTE — Telephone Encounter (Signed)
Pt was barley understandable but stated she fell and is now constantly dizzy and cant stay awake. Pt would like to speak to the RN. Please advise.

## 2021-06-25 NOTE — Telephone Encounter (Signed)
Contacted pt back, informed her that Janett Billow is still reviewing her recent MRI and we will contact her with any concerns. Advised to seek attention from [facility] or urgent care if current symptoms continue. She understood

## 2021-06-25 NOTE — Telephone Encounter (Signed)
Contacted pt to inform her that recent MRI did not show any evidence of new stroke since prior MRI in December.  Previously reported possible left-sided stroke on CT scan not seen on MRI.  No further work-up needed at this time, advised to continue current regimen. She stated that was great news and was appreciative.

## 2021-06-27 ENCOUNTER — Other Ambulatory Visit: Payer: Self-pay | Admitting: *Deleted

## 2021-06-27 ENCOUNTER — Other Ambulatory Visit: Payer: Self-pay

## 2021-06-27 NOTE — Patient Outreach (Signed)
[facility]) Care Management  06/27/2021  Laura [name] 1951-12-15 [id number]    [facility] follow outreach to complex care patient  Laura [name] was referred to [facility] on 12/04/20 for EMMI stroke referral issues related questions/problems with medicines, lost interest in things they used to enjoy and symptoms of sad, hopeless, anxious, or empty was reported. After several unsuccessful outreaches, she returned a call on 12/12/20 to initiate EMMI Stroke services On 12/24/20 she reported further medication cost issues and was referred to [place] admission 11/20/20-11/23/20 Acute R MCA ischemic stroke [facility]) s/p revascularization R M1, embolic d/t known AF 5/68/12-7/51/70 enteritis and Atrial fibrillation with rapid ventricular response Insurance [employer] Medicare and [employer]   Patient is able to verify HIPAA ([place]) identifiers Reviewed and addressed the purpose of the follow up call with the patient  Consent: [facility] RN CM reviewed [facility] services with patient. Patient gave verbal consent for services.   Assessment [facility] Care guide referral 06/18/21 a gift card from [facility] ($300) was provided by [facility] care guide Jeannine  The patient was able to pick it up but fresh foods with it   [place] Referral  Cristopher Peru ,CM, medication management clinic staff, provided pt with new pt package on 06/20/21 to complete to assist with medications. Laura [name] reports she has not completed this as she is not understanding "thirty percent" She has not outreached to [place]. Inez Catalina has made unsuccessful attempt to reach pt. Laura [name] is questioning if taking the time to find the information needed to complete the forms "is worth it, My daughter told me not to worry. She will help me"  [facility] RN CM strongly encouraged her to complete the forms  [name], [facility] pharmacist,made several attempts to outreach to patient unsuccessfully  with case closure on 06/25/21   Stress (finances, medicines) -previous counseling resources provided and [facility] SW services offered but refused  Had a crown (dental) for $1400 last week Her daughter assisted with the cost of this  Fall 06/23/21 out on her back porch under the table when she went to sit in a chair on the porch Bruised above knee Hit head on table but reports not open areas She did not go to ED, Her Son in Sports coach and grandson assisted her  [facility] RN CM inquired about low cbg or low BP  She recalls a BP ~105/60 at a MD office visit Pt states during various MD visits last 1-2 weeks, she had stopped taking prednisone and her diuretic was increased. She thinks her fall was related to dehydration from increase diuretic and "over doing it Sunday" She reports she had cleaned the pool by herself   Mobility Pt feels she may need a neuro rehab services [facility] RN CM discussed there are not any neuro rehab centers in [place]  Discussed the one in [place] She prefers to have one close to her home so family can take her Referred to her neurologist and [facility] staff Mentioned and encouraged compression hose  She has some but they do not fit well and she will not be able to afford custom ones [facility] RN CM recommended [place] [place] PT services no longer available  She now goes to [facility] ( [employer] [phone])  She reports missing the last 2 visits related to not feeling well or other appointments he reports she believes she is not scheduled to return until possibly  August 2022  Depression symptoms are reported to be better went to church on 06/23/21 Had family an visitors every day during the July 4th weekend  Diabetes Reports better home cbgs lower than 200s  Lab Results  Component Value Date   HGBA1C 6.4 (H) 06/12/2021  .  Weight Management Pt reports she want to lose about 40+ lbs to get to 200 lbs Reports her recent weight was 244 lbs BMI 41.96 She reports she is  now following a low carb diet  She is now walking in the pool and using walker throughout her home to increase walking Swallowing is better  August 2022 vacation goals- want to be abl to [place] chair  Patient Active Problem List   Diagnosis Date Noted   Hyperlipidemia LDL goal <70 11/23/2020   NICM (nonischemic cardiomyopathy) ([place])    Acute R MCA ischemic stroke ([place]) s/p revascularization R M1, embolic d/t known AF 16/09/9603   Middle cerebral artery embolism, right 11/20/2020   Strain of extensor or abductor muscles, fascia and tendons of unspecified thumb at forearm level, initial encounter 06/12/2020   Lymphedema 06/07/2020   Acute right-sided low back pain with right-sided sciatica 54/08/8118   Chronic systolic heart failure ([place])    Pulmonary hypertension, unspecified ([place])    Nutritional deficiency 01/02/2020   Memory deficit 01/02/2020   Encounter for therapeutic drug level monitoring 01/02/2020   B12 deficiency 01/02/2020   PAD (peripheral artery disease) ([place]) 12/05/2019   Intractable vomiting 10/09/2019   Intestinal ischemia (HCC)    COPD with acute exacerbation (HCC)    Atrial fibrillation with rapid ventricular response ([place]) 09/11/2019   Enteritis    Peripheral edema 07/17/2019   Atherosclerosis of both carotid arteries 07/17/2019   Varicose veins of right lower extremity with inflammation 06/08/2019   Gastroesophageal reflux disease with esophagitis 05/08/2019   Fall at home, initial encounter 03/16/2019   Contusion of right lower leg 03/16/2019   Flu-like symptoms 02/03/2019   Nausea 02/03/2019   Suprapatellar bursitis of right knee 01/23/2019   Pain in right leg 01/23/2019   Cellulitis of right leg 01/23/2019   Chronic venous stasis dermatitis of right lower extremity 01/23/2019   Oropharyngeal candidiasis 01/23/2019   Cellulitis of head except face 12/23/2018   Screening for breast cancer 08/15/2018    Chronic bronchitis with acute exacerbation ([place]) 08/15/2018   Vitamin D deficiency 08/15/2018   Need for vaccination against Streptococcus pneumoniae using pneumococcal conjugate vaccine 13 08/15/2018   Obstructive chronic bronchitis without exacerbation ([place]) 07/14/2018   Morbid obesity ([place]) 03/17/2018   Asthma 02/25/2018   Generalized anxiety disorder 12/16/2017   Essential hypertension 12/16/2017   Chronic respiratory failure with hypoxia ([place]) 12/16/2017   Long term (current) use of anticoagulants 12/16/2017   Chronic atrial fibrillation ([place]) 12/16/2017   Other specified functional intestinal disorders 12/16/2017   Gastro-esophageal reflux disease without esophagitis 12/16/2017   OSA on CPAP 12/16/2017   Allergic rhinitis due to animal (cat) (dog) hair and dander 12/16/2017   Allergic rhinitis due to pollen 12/16/2017   Severe persistent asthma without complication 14/78/2956   Cough 12/16/2017   Shortness of breath 12/16/2017   Superficial thrombophlebitis of right leg 11/17/2016   Pain in limb 11/17/2016   Chronic venous insufficiency 11/17/2016   Swelling of limb 11/17/2016   Cellulitis of buttock    Cellulitis of right axilla    Sepsis ([place]) 06/28/2016   Cellulitis and abscess  06/28/2016   Hypokalemia 06/28/2016   Acute renal insufficiency 06/28/2016   Hyperglycemia 06/28/2016   Leukocytosis 06/28/2016   Hypoxia 06/28/2016   Collagenous colitis 05/13/2016      Plan Patient agrees to care plan and follow up within the next 30 business days  Outreach to [facility] [phone] Spoke with [name] Left a message for [employer] for patient related to reported difficulty with raising her arm, 06/23/21 fall, foot drop with wish to have a foot drop brace, poor gait and an interest in neuro rehab or a referral to a local neuro rehab facility  Last seen at [facility] in early June 2022  [place]. Lavina Hamman, RN, BSN, [place] Coordinator Office  number [phone] Main [facility] number [phone] Fax number [phone]

## 2021-07-01 ENCOUNTER — Ambulatory Visit: Payer: Medicare Other | Admitting: Internal Medicine

## 2021-07-05 ENCOUNTER — Other Ambulatory Visit: Payer: Self-pay | Admitting: *Deleted

## 2021-07-05 NOTE — Patient Outreach (Signed)
[facility]) Care Management  07/05/2021  Laura [name] 02-Jan-1951 [id number]   [place] coordination-collaboration with [facility] PT staff  Return call from [name] at [facility] Reviewed RN CM notes related to pt voiced concern with possible need of a brace, etc and/or neuro rehab therapy services. Discussed her recent 06/23/21 fall on 06/27/21 RN CM had left a message with [name] at [facility] -for patient related to reported difficulty with raising her arm, 06/23/21 fall, foot drop with wish to have a foot drop brace, poor gait and an interest in neuro rehab or a referral to a local neuro rehab facility [name] confirms he is not a neuro rehab therapist and pt had concluded all of the PT sessions with him. He also recalls working with her primarily on her back concerns and pt may have mention her left knee in conversation only. He is able to confirm pt does have a follow [facility] visit with her [facility] MD, [name] on 07/22/21 but not [facility] PT.  Pt is suggested to discuss her concern with Dr [name] during upcoming appointment    Plan [facility] RN CM will follow up with patient within the next 30 business days  Laura [name] L. Lavina Hamman, RN, BSN, [place] Coordinator Office number [phone] Mobile number [phone]  Main [facility] number [phone] Fax number [phone]

## 2021-07-06 ENCOUNTER — Encounter (INDEPENDENT_AMBULATORY_CARE_PROVIDER_SITE_OTHER): Payer: Self-pay | Admitting: Nurse Practitioner
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
## 2021-07-16 ENCOUNTER — Ambulatory Visit (INDEPENDENT_AMBULATORY_CARE_PROVIDER_SITE_OTHER): Payer: Medicare Other | Admitting: Internal Medicine

## 2021-07-16 ENCOUNTER — Ambulatory Visit (INDEPENDENT_AMBULATORY_CARE_PROVIDER_SITE_OTHER): Payer: Medicare Other

## 2021-07-16 ENCOUNTER — Other Ambulatory Visit: Payer: Self-pay

## 2021-07-16 ENCOUNTER — Encounter: Payer: Self-pay | Admitting: Internal Medicine

## 2021-07-16 VITALS — BP 135/78 | HR 54 | Temp 97.3°F | Resp 16 | Ht 64.0 in | Wt 239.2 lb

## 2021-07-16 DIAGNOSIS — Z7189 Other specified counseling: Secondary | ICD-10-CM

## 2021-07-16 DIAGNOSIS — E538 Deficiency of other specified B group vitamins: Secondary | ICD-10-CM

## 2021-07-16 DIAGNOSIS — R0602 Shortness of breath: Secondary | ICD-10-CM | POA: Diagnosis not present

## 2021-07-16 DIAGNOSIS — I639 Cerebral infarction, unspecified: Secondary | ICD-10-CM

## 2021-07-16 DIAGNOSIS — I482 Chronic atrial fibrillation, unspecified: Secondary | ICD-10-CM

## 2021-07-16 DIAGNOSIS — G4733 Obstructive sleep apnea (adult) (pediatric): Secondary | ICD-10-CM

## 2021-07-16 MED ORDER — CYANOCOBALAMIN 1000 MCG/ML IJ SOLN
1000.0000 ug | Freq: Once | INTRAMUSCULAR | Status: AC
  Administered 2021-07-16: 1000 ug via INTRAMUSCULAR

## 2021-07-16 NOTE — Progress Notes (Signed)
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 pneumoniae using pneumococcal conjugate vaccine 13  08/15/2018   Obstructive chronic bronchitis without exacerbation ([place]) 07/14/2018   Morbid obesity ([place]) 03/17/2018   Asthma 02/25/2018   Generalized anxiety disorder 12/16/2017   Essential hypertension 12/16/2017   Chronic respiratory failure with hypoxia ([place]) 12/16/2017   Long term (current) use of anticoagulants 12/16/2017   Chronic atrial fibrillation ([place]) 12/16/2017   Other specified functional intestinal disorders 12/16/2017   Gastro-esophageal reflux disease without esophagitis 12/16/2017   OSA on CPAP 12/16/2017   Allergic rhinitis due to animal (cat) (dog) hair and dander 12/16/2017    Allergic rhinitis due to pollen 12/16/2017   Severe persistent asthma without complication [id number]   Cough 12/16/2017   Shortness of breath 12/16/2017   Superficial thrombophlebitis of right leg 11/17/2016   Pain in limb 11/17/2016   Chronic venous insufficiency 11/17/2016   Swelling of limb 11/17/2016   Cellulitis of buttock    Cellulitis of right axilla    Sepsis ([place]) 06/28/2016   Cellulitis and abscess 06/28/2016   Hypokalemia 06/28/2016   Acute renal insufficiency 06/28/2016   Hyperglycemia 06/28/2016   Leukocytosis 06/28/2016   Hypoxia 06/28/2016   Collagenous colitis 05/13/2016    1. SOB (shortness of breath)  - Spirometry with Graph  2. OSA (obstructive sleep apnea) On CPAP therapy will continue  3. Chronic atrial fibrillation (HCC) Rate controlled follow with cardiology  4. Acute CVA (cerebrovascular accident) ([place]) Therapy as tolerated walks with cane  5. CPAP use counseling CPAP Counseling: had a lengthy discussion with the patient regarding the importance of PAP therapy in management of the sleep apnea. Patient appears to understand the risk factor reduction and also understands the risks associated with untreated sleep apnea. Patient will try to make a good faith effort to remain compliant with therapy. Also instructed the patient on proper cleaning of the device including the water must be changed daily if possible and use of distilled water is preferred. Patient understands that the machine should be regularly cleaned with appropriate recommended cleaning solutions that do not damage the PAP machine for example given white vinegar and water rinses. Other methods such as ozone treatment may not be as good as these simple methods to achieve cleaning.    General Counseling: I have discussed the findings of the evaluation and examination with [name].  I have also discussed any further diagnostic evaluation thatmay be needed or ordered today. Alveta verbalizes  understanding of the findings of todays visit. We also reviewed her medications today and discussed drug interactions and side effects including but not limited excessive drowsiness and altered mental states. We also discussed that there is always a risk not just to her but also people around her. she has been encouraged to call the office with any questions or concerns that should arise related to todays visit.  Orders Placed This Encounter  Procedures   Spirometry with Graph    Order Specific Question:   Where should this test be performed?    Answer:   [facility]    Order Specific Question:   Basic spirometry    Answer:   Yes     Time spent: 79  I have personally obtained a history, examined the patient, evaluated laboratory and imaging results, formulated the assessment and plan and placed orders.    Allyne Gee, MD [facility] Sleep medicine

## 2021-07-16 NOTE — Patient Instructions (Signed)
Sleep Apnea Sleep apnea affects breathing during sleep. It causes breathing to stop for 10 seconds or more, or to become shallow. People with sleep apnea usually snoreloudly. It can also increase the risk of: Heart attack. Stroke. Being very overweight (obese). Diabetes. Heart failure. Irregular heartbeat. High blood pressure. The goal of treatment is to help you breathe normally again. What are the causes?  The most common cause of this condition is a collapsed or blocked airway. There are three kinds of sleep apnea: Obstructive sleep apnea. This is caused by a blocked or collapsed airway. Central sleep apnea. This happens when the brain does not send the right signals to the muscles that control breathing. Mixed sleep apnea. This is a combination of obstructive and central sleep apnea. What increases the risk? Being overweight. Smoking. Having a small airway. Being older. Being female. Drinking alcohol. Taking medicines to calm yourself (sedatives or tranquilizers). Having family members with the condition. Having a tongue or tonsils that are larger than normal. What are the signs or symptoms? Trouble staying asleep. Loud snoring. Headaches in the morning. Waking up gasping. Dry mouth or sore throat in the morning. Being sleepy or tired during the day. If you are sleepy or tired during the day, you may also: Not be able to focus your mind (concentrate). Forget things. Get angry a lot and have mood swings. Feel sad (depressed). Have changes in your personality. Have less interest in sex, if you are female. Be unable to have an erection, if you are female. How is this treated?  Sleeping on your side. Using a medicine to get rid of mucus in your nose (decongestant). Avoiding the use of alcohol, medicines to help you relax, or certain pain medicines (narcotics). Losing weight, if needed. Changing your diet. Quitting smoking. Using a machine to open your airway while you  sleep, such as: An oral appliance. This is a mouthpiece that shifts your lower jaw forward. A CPAP device. This device blows air through a mask when you breathe out (exhale). An EPAP device. This has valves that you put in each nostril. A BPAP device. This device blows air through a mask when you breathe in (inhale) and breathe out. Having surgery if other treatments do not work. Follow these instructions at home: Lifestyle Make changes that your doctor recommends. Eat a healthy diet. Lose weight if needed. Avoid alcohol, medicines to help you relax, and some pain medicines. Do not smoke or use any products that contain nicotine or tobacco. If you need help quitting, ask your doctor. General instructions Take over-the-counter and prescription medicines only as told by your doctor. If you were given a machine to use while you sleep, use it only as told by your doctor. If you are having surgery, make sure to tell your doctor you have sleep apnea. You may need to bring your device with you. Keep all follow-up visits. Contact a doctor if: The machine that you were given to use during sleep bothers you or does not seem to be working. You do not get better. You get worse. Get help right away if: Your chest hurts. You have trouble breathing in enough air. You have an uncomfortable feeling in your back, arms, or stomach. You have trouble talking. One side of your body feels weak. A part of your face is hanging down. These symptoms may be an emergency. Get help right away. Call your local emergency services (911 in the [place] Do not wait to see if the symptoms   will go away. Do not drive yourself to the hospital. Summary This condition affects breathing during sleep. The most common cause is a collapsed or blocked airway. The goal of treatment is to help you breathe normally while you sleep. This information is not intended to replace advice given to you by your health care provider. Make  sure you discuss any questions you have with your healthcare provider. Document Revised: 11/16/2020 Document Reviewed: 11/16/2020 Elsevier Patient Education  2022 [employer].  

## 2021-07-17 ENCOUNTER — Telehealth: Payer: Self-pay

## 2021-07-17 ENCOUNTER — Inpatient Hospital Stay: Payer: Self-pay | Admitting: Adult Health

## 2021-07-17 NOTE — Telephone Encounter (Signed)
Patient states she is needing a refill on Omeprazole '40mg'$ . Patient states the pharmacy has requested a refill on medication but no one has responded to the refill request. Patient states she has been out of medication for 2 weeks and her acid reflex is coming back. Patient would like a refill sent to [facility] [address]

## 2021-07-18 ENCOUNTER — Other Ambulatory Visit: Payer: Self-pay

## 2021-07-21 ENCOUNTER — Other Ambulatory Visit: Payer: Self-pay | Admitting: Internal Medicine

## 2021-07-21 DIAGNOSIS — Z20822 Contact with and (suspected) exposure to covid-19: Secondary | ICD-10-CM | POA: Diagnosis not present

## 2021-07-21 DIAGNOSIS — R531 Weakness: Secondary | ICD-10-CM | POA: Diagnosis not present

## 2021-07-21 DIAGNOSIS — R29818 Other symptoms and signs involving the nervous system: Secondary | ICD-10-CM | POA: Diagnosis not present

## 2021-07-21 DIAGNOSIS — Z0389 Encounter for observation for other suspected diseases and conditions ruled out: Secondary | ICD-10-CM | POA: Diagnosis not present

## 2021-07-21 DIAGNOSIS — R4789 Other speech disturbances: Secondary | ICD-10-CM | POA: Diagnosis not present

## 2021-07-21 DIAGNOSIS — I4891 Unspecified atrial fibrillation: Secondary | ICD-10-CM | POA: Diagnosis not present

## 2021-07-21 DIAGNOSIS — I10 Essential (primary) hypertension: Secondary | ICD-10-CM | POA: Diagnosis not present

## 2021-07-21 DIAGNOSIS — G8929 Other chronic pain: Secondary | ICD-10-CM | POA: Diagnosis not present

## 2021-07-21 DIAGNOSIS — M6281 Muscle weakness (generalized): Secondary | ICD-10-CM | POA: Diagnosis not present

## 2021-07-21 DIAGNOSIS — I2510 Atherosclerotic heart disease of native coronary artery without angina pectoris: Secondary | ICD-10-CM | POA: Diagnosis not present

## 2021-07-21 DIAGNOSIS — M545 Low back pain, unspecified: Secondary | ICD-10-CM | POA: Diagnosis not present

## 2021-07-21 DIAGNOSIS — J449 Chronic obstructive pulmonary disease, unspecified: Secondary | ICD-10-CM | POA: Diagnosis not present

## 2021-07-21 DIAGNOSIS — E785 Hyperlipidemia, unspecified: Secondary | ICD-10-CM | POA: Diagnosis not present

## 2021-07-21 DIAGNOSIS — R630 Anorexia: Secondary | ICD-10-CM | POA: Diagnosis not present

## 2021-07-21 DIAGNOSIS — Z8673 Personal history of transient ischemic attack (TIA), and cerebral infarction without residual deficits: Secondary | ICD-10-CM | POA: Diagnosis not present

## 2021-07-22 DIAGNOSIS — M479 Spondylosis, unspecified: Secondary | ICD-10-CM | POA: Diagnosis not present

## 2021-07-22 DIAGNOSIS — M21371 Foot drop, right foot: Secondary | ICD-10-CM | POA: Diagnosis not present

## 2021-07-22 DIAGNOSIS — M62830 Muscle spasm of back: Secondary | ICD-10-CM | POA: Diagnosis not present

## 2021-07-22 DIAGNOSIS — M545 Low back pain, unspecified: Secondary | ICD-10-CM | POA: Diagnosis not present

## 2021-07-22 DIAGNOSIS — M461 Sacroiliitis, not elsewhere classified: Secondary | ICD-10-CM | POA: Diagnosis not present

## 2021-07-24 ENCOUNTER — Other Ambulatory Visit: Payer: Self-pay | Admitting: *Deleted

## 2021-07-24 ENCOUNTER — Other Ambulatory Visit: Payer: Self-pay

## 2021-07-24 MED ORDER — OMEPRAZOLE 40 MG PO CPDR: 40.0000 mg | DELAYED_RELEASE_CAPSULE | Freq: Every day | ORAL | 3 refills | Status: DC

## 2021-07-24 NOTE — Patient Outreach (Signed)
[facility]) Care Management  07/24/2021  MONTOYA PRUDENT 1951/01/04 [id number]  [facility] follow outreach to complex care patient   [name] was referred to [facility] on 12/04/20 for EMMI stroke referral issues related questions/problems with medicines, lost interest in things they used to enjoy and symptoms of sad, hopeless, anxious, or empty was reported. After several unsuccessful outreaches, she returned a call on 12/12/20 to initiate EMMI Stroke services On 12/24/20 she reported further medication cost issues and was referred to [facility] admission  07/21/21 ED visit at [facility] ED for headache left side weakness r/o CVA, dehydration contribute to symptoms 11/20/20-11/23/20 Acute R MCA ischemic stroke [facility]) s/p revascularization R M1, embolic d/t known AF [id number] enteritis and Atrial fibrillation with rapid ventricular response Insurance [employer] Medicare and [employer]     Patient is able to verify HIPAA ([place]) identifiers Reviewed and addressed the purpose of the follow up call with the patient   Consent: [facility] RN CM reviewed [facility] services with patient. Patient gave verbal consent for services.     Assessment  Stroke/mobility/strength/weight (wt) management Pt informs RN CM she was informed she was having "mini strokes" on 07/06/21 but EPIC 07/06/21 [facility] ED noted indicates dehydration as she reports a decrease appetite Pt reports a loss of 13 lbs in a month intentionally  07/24/21 wt 232 lbs BMI 39.8 was 41.04 Pt personal wt goal is reported as 200 lb   Now reports she is working on dehydration with water, tea Encouraged use of a gallon container for water  Educated her on rebound dehydration as she reports a decrease appetite  She reports she was told at hospital she needed more home therapy and to follow up with her MD about this  Pcp on Monday  07/29/21 Neurology next visit is listed on 09/18/21  Brace for her foot from [facility] She reports it is beneficial except the Velcro gets too tight on her skin.   EMMI follow up issues  Medications -resolved Pharmacy referral completed but daughter will help with all bills and medicines She did received all the forms from the [facility] staff and did not complete as she did not want to add her grand daughter/family information related to financial history Denies depression   Pt reported personal goal Going to [facility] [place] in 3 weeks wants to be able to walk on beach in [place]  Care coordination sent EMMI education on stroke-preventing stroke and TIA, why water is important to health and how to don an AFO via listed e mail address in [place]  Patient agrees to care plan and follow up within the next 30 business days Note to pcp, neurology    Kenniel Bergsma L. Lavina Hamman, RN, BSN, [place] Coordinator Office number [phone] [facility] number [phone] Fax number [phone]

## 2021-07-24 NOTE — Addendum Note (Signed)
Addended by: Wayna Chalet on: 07/24/2021 07:59 AM   Modules accepted: Orders

## 2021-07-24 NOTE — Telephone Encounter (Signed)
Prescription was sent

## 2021-07-28 ENCOUNTER — Other Ambulatory Visit: Payer: Self-pay | Admitting: Internal Medicine

## 2021-07-29 ENCOUNTER — Encounter: Payer: Self-pay | Admitting: Nurse Practitioner

## 2021-07-29 ENCOUNTER — Ambulatory Visit (INDEPENDENT_AMBULATORY_CARE_PROVIDER_SITE_OTHER): Payer: Medicare Other | Admitting: Nurse Practitioner

## 2021-07-29 ENCOUNTER — Other Ambulatory Visit: Payer: Self-pay

## 2021-07-29 VITALS — BP 122/78 | HR 62 | Temp 98.3°F | Resp 16 | Ht 64.0 in | Wt 243.0 lb

## 2021-07-29 DIAGNOSIS — I482 Chronic atrial fibrillation, unspecified: Secondary | ICD-10-CM

## 2021-07-29 DIAGNOSIS — I5022 Chronic systolic (congestive) heart failure: Secondary | ICD-10-CM | POA: Diagnosis not present

## 2021-07-29 DIAGNOSIS — I10 Essential (primary) hypertension: Secondary | ICD-10-CM

## 2021-07-29 DIAGNOSIS — R300 Dysuria: Secondary | ICD-10-CM | POA: Diagnosis not present

## 2021-07-29 DIAGNOSIS — Z0001 Encounter for general adult medical examination with abnormal findings: Secondary | ICD-10-CM | POA: Diagnosis not present

## 2021-07-29 DIAGNOSIS — Z9989 Dependence on other enabling machines and devices: Secondary | ICD-10-CM

## 2021-07-29 DIAGNOSIS — G4733 Obstructive sleep apnea (adult) (pediatric): Secondary | ICD-10-CM | POA: Diagnosis not present

## 2021-07-29 DIAGNOSIS — I6930 Unspecified sequelae of cerebral infarction: Secondary | ICD-10-CM
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
## 2021-08-01 DIAGNOSIS — M461 Sacroiliitis, not elsewhere classified: Secondary | ICD-10-CM | POA: Diagnosis not present

## 2021-08-01 LAB — MICROSCOPIC EXAMINATION
Bacteria, UA: NONE SEEN
Casts: NONE SEEN /lpf
Epithelial Cells (non renal): >10 /hpf — AB (ref 0–10)

## 2021-08-01 LAB — UA/M W/RFLX CULTURE, ROUTINE
Bilirubin, UA: NEGATIVE
Glucose, UA: NEGATIVE
Ketones, UA: NEGATIVE
Nitrite, UA: NEGATIVE
RBC, UA: NEGATIVE
Specific Gravity, UA: 1.024 (ref 1.005–1.030)
Urobilinogen, Ur: 0.2 mg/dL (ref 0.2–1.0)
pH, UA: 5.5 (ref 5.0–7.5)

## 2021-08-01 LAB — URINE CULTURE, REFLEX

## 2021-08-06 ENCOUNTER — Other Ambulatory Visit: Payer: Self-pay | Admitting: Internal Medicine

## 2021-08-06 DIAGNOSIS — R0602 Shortness of breath: Secondary | ICD-10-CM

## 2021-08-10 ENCOUNTER — Other Ambulatory Visit: Payer: Self-pay | Admitting: Family

## 2021-08-15 ENCOUNTER — Other Ambulatory Visit: Payer: Self-pay | Admitting: Cardiovascular Disease

## 2021-08-15 MED ORDER — DILTIAZEM HCL ER COATED BEADS 360 MG PO CP24: ORAL_CAPSULE | ORAL | 4 refills | Status: DC

## 2021-08-15 NOTE — Telephone Encounter (Signed)
*  STAT* If patient is at the pharmacy, call can be transferred to refill team.   1. Which medications need to be refilled? (please list name of each medication and dose if known) Diltiazem  2. Which pharmacy/location (including street and city if local pharmacy) is medication to be sent to? [facility]  3. Do they need a 30 day or 90 day supply? [place]

## 2021-08-15 NOTE — Addendum Note (Signed)
Addended by: Britt Bottom on: 08/15/2021 09:35 AM   Modules accepted: Orders

## 2021-08-19 DIAGNOSIS — M62830 Muscle spasm of back: Secondary | ICD-10-CM | POA: Diagnosis not present

## 2021-08-19 DIAGNOSIS — M21371 Foot drop, right foot: Secondary | ICD-10-CM | POA: Diagnosis not present

## 2021-08-19 DIAGNOSIS — M545 Low back pain, unspecified: Secondary | ICD-10-CM | POA: Diagnosis not present

## 2021-08-19 DIAGNOSIS — M461 Sacroiliitis, not elsewhere classified: Secondary | ICD-10-CM | POA: Diagnosis not present

## 2021-08-19 DIAGNOSIS — M479 Spondylosis, unspecified: Secondary | ICD-10-CM | POA: Diagnosis not present

## 2021-08-22 ENCOUNTER — Other Ambulatory Visit: Payer: Self-pay | Admitting: Cardiovascular Disease

## 2021-08-22 NOTE — Telephone Encounter (Signed)
Prescription refill request for Eliquis received. Indication: Atrial fib Last office visit: 06/20/21  Rod Can MD Scr: 0.76 on 07/21/21 Age:  70 Weight: 112.2kg  Based on above findings Eliquis '5mg'$  twice daily is the appropriate dose.  Refill approved.

## 2021-08-23 ENCOUNTER — Encounter: Payer: Self-pay | Admitting: Nurse Practitioner

## 2021-08-27 ENCOUNTER — Other Ambulatory Visit: Payer: Self-pay

## 2021-08-27 MED ORDER — THEOPHYLLINE ER 100 MG PO CP24: ORAL_CAPSULE | ORAL | 3 refills | Status: DC

## 2021-08-28 ENCOUNTER — Other Ambulatory Visit: Payer: Self-pay | Admitting: *Deleted

## 2021-08-28 NOTE — Patient Outreach (Signed)
[place] [facility]) Care Management  08/28/2021  SAPHERA BRAWN 1951/08/25 [id number]   [facility] Unsuccessful outreach   Outreach attempt to the home number  No answer. [facility] RN CM left HIPAA [facility] Portability and Accountability Act) compliant voicemail message along with CM's contact info.   Plan: [facility] RN CM scheduled this patient for another call attempt within 4-7 business days Unsuccessful outreach on 08/28/21   Joelene Millin L. Lavina Hamman, RN, BSN, [place] Coordinator Office number [phone] Mobile number [phone]  Main [facility] number [phone] Fax number [phone]

## 2021-09-04 ENCOUNTER — Other Ambulatory Visit: Payer: Self-pay | Admitting: *Deleted

## 2021-09-04 NOTE — Patient Outreach (Signed)
[place] [facility]) Care Management  09/04/2021  Laura [name] [birth year] [id number]   [facility] Unsuccessful outreach  Laura [name] was referred to [facility] on 12/04/20 for EMMI stroke referral issues related questions/problems with medicines, lost interest in things they used to enjoy and symptoms of sad, hopeless, anxious, or empty was reported. After several unsuccessful outreaches, she returned a call on 12/12/20 to initiate EMMI Stroke services On 12/24/20 she reported further medication cost issues and was referred to [place] admission  07/21/21 ED visit at [facility] ED for headache left side weakness r/o CVA, dehydration contribute to symptoms 11/20/20-11/23/20 Acute R MCA ischemic stroke [facility]) s/p revascularization R M1, embolic d/t known AF [id number] enteritis and Atrial fibrillation with rapid ventricular response Insurance [employer] Medicare and [place] attempt to the home number  No answer. [facility] RN CM left HIPAA [facility] Portability and Accountability Act) compliant voicemail message along with CM's contact info.   Plan: [facility] RN CM scheduled this patient for another call attempt within 4-7 business days Unsuccessful outreach letter sent on 09/04/21 Unsuccessful outreach on 08/28/21, 09/04/21   Joelene Millin L. Lavina Hamman, RN, BSN, [place] Coordinator Office number [phone] Mobile number [phone]  Main [facility] number [phone] Fax number [phone]

## 2021-09-11 ENCOUNTER — Other Ambulatory Visit: Payer: Self-pay | Admitting: *Deleted

## 2021-09-11 NOTE — Patient Outreach (Signed)
[place] [facility]) Care Management  09/11/2021  NIKE SOUTHERS 1951-03-05 [id number]   [facility] Unsuccessful outreach    [name] was referred to [facility] on 12/04/20 for EMMI stroke referral issues related questions/problems with medicines, lost interest in things they used to enjoy and symptoms of sad, hopeless, anxious, or empty was reported. After several unsuccessful outreaches, she returned a call on 12/12/20 to initiate EMMI Stroke services On 12/24/20 she reported further medication cost issues and was referred to [place] admission  07/21/21 ED visit at [facility] ED for headache left side weakness r/o CVA, dehydration contribute to symptoms 11/20/20-11/23/20 Acute R MCA ischemic stroke [facility]) s/p revascularization R M1, embolic d/t known AF 3/79/02-03/30/72 enteritis and Atrial fibrillation with rapid ventricular response Insurance [employer] Medicare and [place] attempt to the home number [phone] No answer. [facility] RN CM left HIPAA [facility] Portability and Accountability Act) compliant voicemail message along with CM's contact info.    Plan: [facility] RN CM scheduled this patient for case closure per workflow Unsuccessful outreach letter sent on 09/04/21 Unsuccessful outreach on 08/28/21, 09/04/21, 09/11/21       Blaike Vickers L. Lavina Hamman, RN, BSN, [place] Coordinator Office number [phone] Mobile number [phone]  Main [facility] number [phone] Fax number [phone]

## 2021-09-13 ENCOUNTER — Other Ambulatory Visit (INDEPENDENT_AMBULATORY_CARE_PROVIDER_SITE_OTHER): Payer: Medicare Other

## 2021-09-13 ENCOUNTER — Other Ambulatory Visit: Payer: Self-pay

## 2021-09-13 DIAGNOSIS — I482 Chronic atrial fibrillation, unspecified: Secondary | ICD-10-CM | POA: Diagnosis not present

## 2021-09-13 DIAGNOSIS — I5022 Chronic systolic (congestive) heart failure: Secondary | ICD-10-CM

## 2021-09-13 DIAGNOSIS — I25118 Atherosclerotic heart disease of native coronary artery with other forms of angina pectoris: Secondary | ICD-10-CM

## 2021-09-14 LAB — BASIC METABOLIC PANEL
BUN/Creatinine Ratio: 19 (ref 12–28)
BUN: 17 mg/dL (ref 8–27)
CO2: 21 mmol/L (ref 20–29)
Calcium: 8.8 mg/dL (ref 8.7–10.3)
Chloride: 104 mmol/L (ref 96–106)
Creatinine, Ser: 0.89 mg/dL (ref 0.57–1.00)
Glucose: 100 mg/dL — ABNORMAL HIGH (ref 65–99)
Potassium: 4.1 mmol/L (ref 3.5–5.2)
Sodium: 143 mmol/L (ref 134–144)
eGFR: 69 mL/min/{1.73_m2} (ref >59)

## 2021-09-18 ENCOUNTER — Encounter: Payer: Self-pay | Admitting: Adult Health

## 2021-09-18 ENCOUNTER — Ambulatory Visit (INDEPENDENT_AMBULATORY_CARE_PROVIDER_SITE_OTHER): Payer: Medicare Other | Admitting: Adult Health

## 2021-09-18 VITALS — BP 128/66 | HR 76 | Ht 64.0 in | Wt 243.0 lb

## 2021-09-18 DIAGNOSIS — I69354 Hemiplegia and hemiparesis following cerebral infarction affecting left non-dominant side: Secondary | ICD-10-CM | POA: Diagnosis not present

## 2021-09-18 DIAGNOSIS — I69398 Other sequelae of cerebral infarction: Secondary | ICD-10-CM

## 2021-09-18 DIAGNOSIS — I639 Cerebral infarction, unspecified: Secondary | ICD-10-CM

## 2021-09-18 DIAGNOSIS — R269 Unspecified abnormalities of gait and mobility: Secondary | ICD-10-CM

## 2021-09-18 NOTE — Patient Instructions (Signed)
Referral placed for home health physical therapy - you should be called to schedule   Continue Eliquis (apixaban) daily  and Crestor  for secondary stroke prevention  Continue to follow up with PCP regarding cholesterol and blood pressure management  Maintain strict control of hypertension with blood pressure goal below 130/90 and cholesterol with LDL cholesterol (bad cholesterol) goal below 70 mg/dL.      Followup in the future with me in 6 months or call earlier if needed     Thank you for coming to see [place] at [facility]. I hope we have been able to provide you high quality care today.  You may receive a patient satisfaction survey over the next few weeks. We would appreciate your feedback and comments so that we may continue to improve ourselves and the health of our patients.
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
## 2021-09-19 ENCOUNTER — Telehealth: Payer: Self-pay | Admitting: Adult Health

## 2021-09-19 NOTE — Telephone Encounter (Signed)
Home health referral sent to [facility]. They reviewed and were able to accept. They will call patient for [facility].

## 2021-09-20 ENCOUNTER — Other Ambulatory Visit: Payer: Self-pay | Admitting: Internal Medicine

## 2021-09-20 DIAGNOSIS — E669 Obesity, unspecified: Secondary | ICD-10-CM | POA: Diagnosis not present

## 2021-09-20 DIAGNOSIS — I110 Hypertensive heart disease with heart failure: Secondary | ICD-10-CM | POA: Diagnosis not present

## 2021-09-20 DIAGNOSIS — Z7952 Long term (current) use of systemic steroids: Secondary | ICD-10-CM | POA: Diagnosis not present

## 2021-09-20 DIAGNOSIS — Z79891 Long term (current) use of opiate analgesic: Secondary | ICD-10-CM | POA: Diagnosis not present

## 2021-09-20 DIAGNOSIS — I69398 Other sequelae of cerebral infarction: Secondary | ICD-10-CM | POA: Diagnosis not present

## 2021-09-20 DIAGNOSIS — I482 Chronic atrial fibrillation, unspecified: Secondary | ICD-10-CM | POA: Diagnosis not present

## 2021-09-20 DIAGNOSIS — I69354 Hemiplegia and hemiparesis following cerebral infarction affecting left non-dominant side: Secondary | ICD-10-CM | POA: Diagnosis not present

## 2021-09-20 DIAGNOSIS — R600 Localized edema: Secondary | ICD-10-CM

## 2021-09-20 DIAGNOSIS — M5410 Radiculopathy, site unspecified: Secondary | ICD-10-CM | POA: Diagnosis not present

## 2021-09-20 DIAGNOSIS — F418 Other specified anxiety disorders: Secondary | ICD-10-CM | POA: Diagnosis not present

## 2021-09-20 DIAGNOSIS — J449 Chronic obstructive pulmonary disease, unspecified: Secondary | ICD-10-CM | POA: Diagnosis not present

## 2021-09-20 DIAGNOSIS — Z9181 History of falling: Secondary | ICD-10-CM | POA: Diagnosis not present

## 2021-09-20 DIAGNOSIS — I081 Rheumatic disorders of both mitral and tricuspid valves: Secondary | ICD-10-CM | POA: Diagnosis not present

## 2021-09-20 DIAGNOSIS — K219 Gastro-esophageal reflux disease without esophagitis: Secondary | ICD-10-CM | POA: Diagnosis not present

## 2021-09-20 DIAGNOSIS — G8929 Other chronic pain: Secondary | ICD-10-CM | POA: Diagnosis not present

## 2021-09-20 DIAGNOSIS — I272 Pulmonary hypertension, unspecified: Secondary | ICD-10-CM | POA: Diagnosis not present

## 2021-09-20 DIAGNOSIS — M21372 Foot drop, left foot: Secondary | ICD-10-CM | POA: Diagnosis not present

## 2021-09-20 DIAGNOSIS — G4733 Obstructive sleep apnea (adult) (pediatric): Secondary | ICD-10-CM | POA: Diagnosis not present

## 2021-09-20 DIAGNOSIS — I5020 Unspecified systolic (congestive) heart failure: Secondary | ICD-10-CM | POA: Diagnosis not present

## 2021-09-20 DIAGNOSIS — Z7901 Long term (current) use of anticoagulants: Secondary | ICD-10-CM | POA: Diagnosis not present

## 2021-09-20 DIAGNOSIS — M5441 Lumbago with sciatica, right side: Secondary | ICD-10-CM | POA: Diagnosis not present

## 2021-09-20 DIAGNOSIS — I428 Other cardiomyopathies: Secondary | ICD-10-CM | POA: Diagnosis not present

## 2021-09-20 DIAGNOSIS — E785 Hyperlipidemia, unspecified: Secondary | ICD-10-CM | POA: Diagnosis not present

## 2021-09-20 DIAGNOSIS — I2510 Atherosclerotic heart disease of native coronary artery without angina pectoris: Secondary | ICD-10-CM | POA: Diagnosis not present

## 2021-09-20 DIAGNOSIS — Z8673 Personal history of transient ischemic attack (TIA), and cerebral infarction without residual deficits: Secondary | ICD-10-CM | POA: Diagnosis not present

## 2021-09-20 DIAGNOSIS — I739 Peripheral vascular disease, unspecified: Secondary | ICD-10-CM | POA: Diagnosis not present

## 2021-09-20 NOTE — Telephone Encounter (Signed)
Pt gets this med by cardiology  

## 2021-09-22 NOTE — Progress Notes (Signed)
I agree with the above plan 

## 2021-09-23 ENCOUNTER — Telehealth: Payer: Self-pay | Admitting: Adult Health

## 2021-09-23 NOTE — Telephone Encounter (Signed)
Are you agreeable with this plan? And for me to do VO?

## 2021-09-23 NOTE — Telephone Encounter (Signed)
Yes please. Thank you

## 2021-09-23 NOTE — Telephone Encounter (Signed)
Cindy with [facility] called requesting VO of PT 1 week 1, 2 week 4, and 1 week 4. Cindy left a call back number [phone]  and you may leave detailed message on vm.

## 2021-09-24 ENCOUNTER — Other Ambulatory Visit: Payer: Self-pay | Admitting: Internal Medicine

## 2021-09-24 DIAGNOSIS — M21372 Foot drop, left foot: Secondary | ICD-10-CM | POA: Diagnosis not present

## 2021-09-24 DIAGNOSIS — R600 Localized edema: Secondary | ICD-10-CM

## 2021-09-24 DIAGNOSIS — I428 Other cardiomyopathies: Secondary | ICD-10-CM | POA: Diagnosis not present

## 2021-09-24 DIAGNOSIS — I69354 Hemiplegia and hemiparesis following cerebral infarction affecting left non-dominant side: Secondary | ICD-10-CM | POA: Diagnosis not present

## 2021-09-24 DIAGNOSIS — I110 Hypertensive heart disease with heart failure: Secondary | ICD-10-CM | POA: Diagnosis not present

## 2021-09-24 DIAGNOSIS — I69398 Other sequelae of cerebral infarction: Secondary | ICD-10-CM | POA: Diagnosis not present

## 2021-09-24 DIAGNOSIS — I5020 Unspecified systolic (congestive) heart failure: Secondary | ICD-10-CM | POA: Diagnosis not present

## 2021-09-24 NOTE — Telephone Encounter (Signed)
Contacted Cindy with [facility], LVM as requested giving VO of PT 1 week 1, 2 week 4, and 1 week 4. Advised to call office with questions, number provided.

## 2021-09-25 ENCOUNTER — Other Ambulatory Visit: Payer: Self-pay

## 2021-09-25 ENCOUNTER — Telehealth: Payer: Self-pay | Admitting: Cardiovascular Disease

## 2021-09-25 DIAGNOSIS — I428 Other cardiomyopathies: Secondary | ICD-10-CM | POA: Diagnosis not present

## 2021-09-25 DIAGNOSIS — I69354 Hemiplegia and hemiparesis following cerebral infarction affecting left non-dominant side: Secondary | ICD-10-CM | POA: Diagnosis not present

## 2021-09-25 DIAGNOSIS — I110 Hypertensive heart disease with heart failure: Secondary | ICD-10-CM | POA: Diagnosis not present

## 2021-09-25 DIAGNOSIS — M21372 Foot drop, left foot: Secondary | ICD-10-CM | POA: Diagnosis not present

## 2021-09-25 DIAGNOSIS — I5020 Unspecified systolic (congestive) heart failure: Secondary | ICD-10-CM | POA: Diagnosis not present

## 2021-09-25 DIAGNOSIS — I69398 Other sequelae of cerebral infarction: Secondary | ICD-10-CM | POA: Diagnosis not present

## 2021-09-25 DIAGNOSIS — R600 Localized edema: Secondary | ICD-10-CM

## 2021-09-25 MED ORDER — FUROSEMIDE 40 MG PO TABS: 40.0000 mg | ORAL_TABLET | Freq: Every day | ORAL | 3 refills | Status: DC

## 2021-09-25 NOTE — Telephone Encounter (Signed)
furosemide (LASIX) 40 MG tablet 90 tablet 3 09/25/2021 03/24/2022   Sig - Route: Take 1 tablet (40 mg total) by mouth daily. - Oral   Notes to Pharmacy: Dosage increase    Associated Diagnoses  Lower extremity edema      Pharmacy  [id number] [place], [place] - [address]

## 2021-09-25 NOTE — Telephone Encounter (Signed)
*  STAT* If patient is at the pharmacy, call can be transferred to refill team.   1. Which medications need to be refilled? (please list name of each medication and dose if known) Furosemide 40mg  1 tablet daily  2. Which pharmacy/location (including street and city if local pharmacy) is [facility] to [facility] sent to[facility] [address]  3. Do they need a 30 day or 90 day supply? 90 day

## 2021-09-30 ENCOUNTER — Other Ambulatory Visit: Payer: Self-pay | Admitting: *Deleted

## 2021-09-30 NOTE — Patient Outreach (Signed)
[facility]) Care Management  09/30/2021  Laura [name] November 30, 1951 [id number]   [facility] Case closure -unable to maintain contact   Laura [name] was referred to [facility] on 12/04/20 for EMMI stroke referral issues related questions/problems with medicines, lost interest in things they used to enjoy and symptoms of sad, hopeless, anxious, or empty was reported. After several unsuccessful outreaches, she returned a call on 12/12/20 to initiate EMMI Stroke services On 12/24/20 she reported further medication [name] issues and was referred to [place] [employer] and [employer]     Unsuccessful outreach on 08/28/21, 09/04/21, 09/11/21 Unsuccessful outreach letter sent on 09/04/21 without a response   Plan [facility] RN CM will close case after no response from patient within 10+ business days. Unable to maintain contact  Case closure letters sent to patient and MD  Joelene Millin L. Lavina Hamman, RN, BSN, [place] Coordinator Office number [phone] Mobile number [phone]  Main [facility] number [phone] Fax number [phone]

## 2021-10-01 DIAGNOSIS — I428 Other cardiomyopathies: Secondary | ICD-10-CM | POA: Diagnosis not present

## 2021-10-01 DIAGNOSIS — I5020 Unspecified systolic (congestive) heart failure: Secondary | ICD-10-CM | POA: Diagnosis not present

## 2021-10-01 DIAGNOSIS — M21372 Foot drop, left foot: Secondary | ICD-10-CM | POA: Diagnosis not present

## 2021-10-01 DIAGNOSIS — I69398 Other sequelae of cerebral infarction: Secondary | ICD-10-CM | POA: Diagnosis not present

## 2021-10-01 DIAGNOSIS — I69354 Hemiplegia and hemiparesis following cerebral infarction affecting left non-dominant side: Secondary | ICD-10-CM | POA: Diagnosis not present

## 2021-10-01 DIAGNOSIS — I110 Hypertensive heart disease with heart failure: Secondary | ICD-10-CM | POA: Diagnosis not present

## 2021-10-03 DIAGNOSIS — I5020 Unspecified systolic (congestive) heart failure: Secondary | ICD-10-CM | POA: Diagnosis not present

## 2021-10-03 DIAGNOSIS — I69398 Other sequelae of cerebral infarction: Secondary | ICD-10-CM | POA: Diagnosis not present

## 2021-10-03 DIAGNOSIS — M21372 Foot drop, left foot: Secondary | ICD-10-CM | POA: Diagnosis not present

## 2021-10-03 DIAGNOSIS — I428 Other cardiomyopathies: Secondary | ICD-10-CM | POA: Diagnosis not present

## 2021-10-03 DIAGNOSIS — I110 Hypertensive heart disease with heart failure: Secondary | ICD-10-CM | POA: Diagnosis not present

## 2021-10-03 DIAGNOSIS — I69354 Hemiplegia and hemiparesis following cerebral infarction affecting left non-dominant side: Secondary | ICD-10-CM | POA: Diagnosis not present

## 2021-10-07 ENCOUNTER — Other Ambulatory Visit: Payer: Self-pay | Admitting: Internal Medicine

## 2021-10-07 ENCOUNTER — Telehealth: Payer: Self-pay | Admitting: Adult Health

## 2021-10-07 ENCOUNTER — Ambulatory Visit: Payer: Medicare Other | Admitting: Adult Health

## 2021-10-07 DIAGNOSIS — M7541 Impingement syndrome of right shoulder: Secondary | ICD-10-CM | POA: Diagnosis not present

## 2021-10-07 DIAGNOSIS — R0602 Shortness of breath: Secondary | ICD-10-CM

## 2021-10-07 DIAGNOSIS — M19019 Primary osteoarthritis, unspecified shoulder: Secondary | ICD-10-CM | POA: Insufficient documentation

## 2021-10-07 NOTE — Telephone Encounter (Signed)
Esther from [facility] called stating pt declined OT.

## 2021-10-07 NOTE — Telephone Encounter (Signed)
FYI

## 2021-10-09 DIAGNOSIS — I110 Hypertensive heart disease with heart failure: Secondary | ICD-10-CM | POA: Diagnosis not present

## 2021-10-09 DIAGNOSIS — I69354 Hemiplegia and hemiparesis following cerebral infarction affecting left non-dominant side: Secondary | ICD-10-CM | POA: Diagnosis not present

## 2021-10-09 DIAGNOSIS — I69398 Other sequelae of cerebral infarction: Secondary | ICD-10-CM | POA: Diagnosis not present

## 2021-10-09 DIAGNOSIS — I5020 Unspecified systolic (congestive) heart failure: Secondary | ICD-10-CM | POA: Diagnosis not present

## 2021-10-09 DIAGNOSIS — I428 Other cardiomyopathies: Secondary | ICD-10-CM | POA: Diagnosis not present

## 2021-10-09 DIAGNOSIS — M21372 Foot drop, left foot: Secondary | ICD-10-CM | POA: Diagnosis not present

## 2021-10-10 DIAGNOSIS — I110 Hypertensive heart disease with heart failure: Secondary | ICD-10-CM | POA: Diagnosis not present

## 2021-10-10 DIAGNOSIS — I69398 Other sequelae of cerebral infarction: Secondary | ICD-10-CM | POA: Diagnosis not present

## 2021-10-10 DIAGNOSIS — I428 Other cardiomyopathies: Secondary | ICD-10-CM | POA: Diagnosis not present

## 2021-10-10 DIAGNOSIS — M21372 Foot drop, left foot: Secondary | ICD-10-CM | POA: Diagnosis not present

## 2021-10-10 DIAGNOSIS — I69354 Hemiplegia and hemiparesis following cerebral infarction affecting left non-dominant side: Secondary | ICD-10-CM | POA: Diagnosis not present

## 2021-10-10 DIAGNOSIS — I5020 Unspecified systolic (congestive) heart failure: Secondary | ICD-10-CM | POA: Diagnosis not present

## 2021-10-15 ENCOUNTER — Other Ambulatory Visit: Payer: Self-pay

## 2021-10-15 ENCOUNTER — Ambulatory Visit (INDEPENDENT_AMBULATORY_CARE_PROVIDER_SITE_OTHER): Payer: Medicare Other | Admitting: Podiatry

## 2021-10-15 DIAGNOSIS — M79674 Pain in right toe(s): Secondary | ICD-10-CM | POA: Diagnosis not present

## 2021-10-15 DIAGNOSIS — B351 Tinea unguium: Secondary | ICD-10-CM | POA: Diagnosis not present

## 2021-10-15 DIAGNOSIS — L03031 Cellulitis of right toe: Secondary | ICD-10-CM | POA: Diagnosis not present

## 2021-10-15 DIAGNOSIS — L03039 Cellulitis of unspecified toe: Secondary | ICD-10-CM

## 2021-10-15 DIAGNOSIS — M79675 Pain in left toe(s): Secondary | ICD-10-CM

## 2021-10-15 MED ORDER — GENTAMICIN SULFATE 0.1 % EX CREA: 1.0000 "application " | TOPICAL_CREAM | Freq: Two times a day (BID) | CUTANEOUS | 1 refills | Status: DC

## 2021-10-16 DIAGNOSIS — I69398 Other sequelae of cerebral infarction: Secondary | ICD-10-CM | POA: Diagnosis not present

## 2021-10-16 DIAGNOSIS — I5020 Unspecified systolic (congestive) heart failure: Secondary | ICD-10-CM | POA: Diagnosis not present

## 2021-10-16 DIAGNOSIS — I110 Hypertensive heart disease with heart failure: Secondary | ICD-10-CM | POA: Diagnosis not present

## 2021-10-16 DIAGNOSIS — I428 Other cardiomyopathies: Secondary | ICD-10-CM | POA: Diagnosis not present

## 2021-10-16 DIAGNOSIS — M21372 Foot drop, left foot: Secondary | ICD-10-CM | POA: Diagnosis not present

## 2021-10-16 DIAGNOSIS — I69354 Hemiplegia and hemiparesis following cerebral infarction affecting left non-dominant side: Secondary | ICD-10-CM | POA: Diagnosis not present

## 2021-10-17 DIAGNOSIS — M21372 Foot drop, left foot: Secondary | ICD-10-CM | POA: Diagnosis not present

## 2021-10-17 DIAGNOSIS — I69354 Hemiplegia and hemiparesis following cerebral infarction affecting left non-dominant side: Secondary | ICD-10-CM | POA: Diagnosis not present

## 2021-10-17 DIAGNOSIS — I110 Hypertensive heart disease with heart failure: Secondary | ICD-10-CM | POA: Diagnosis not present

## 2021-10-17 DIAGNOSIS — I69398 Other sequelae of cerebral infarction: Secondary | ICD-10-CM | POA: Diagnosis not present

## 2021-10-17 DIAGNOSIS — I5020 Unspecified systolic (congestive) heart failure: Secondary | ICD-10-CM | POA: Diagnosis not present

## 2021-10-17 DIAGNOSIS — I428 Other cardiomyopathies: Secondary | ICD-10-CM | POA: Diagnosis not present

## 2021-10-20 DIAGNOSIS — Z9181 History of falling: Secondary | ICD-10-CM | POA: Diagnosis not present

## 2021-10-20 DIAGNOSIS — I272 Pulmonary hypertension, unspecified: Secondary | ICD-10-CM | POA: Diagnosis not present

## 2021-10-20 DIAGNOSIS — E785 Hyperlipidemia, unspecified: Secondary | ICD-10-CM | POA: Diagnosis not present

## 2021-10-20 DIAGNOSIS — I482 Chronic atrial fibrillation, unspecified: Secondary | ICD-10-CM | POA: Diagnosis not present

## 2021-10-20 DIAGNOSIS — I428 Other cardiomyopathies: Secondary | ICD-10-CM | POA: Diagnosis not present

## 2021-10-20 DIAGNOSIS — Z79891 Long term (current) use of opiate analgesic: Secondary | ICD-10-CM | POA: Diagnosis not present

## 2021-10-20 DIAGNOSIS — Z7952 Long term (current) use of systemic steroids: Secondary | ICD-10-CM | POA: Diagnosis not present

## 2021-10-20 DIAGNOSIS — J449 Chronic obstructive pulmonary disease, unspecified: Secondary | ICD-10-CM | POA: Diagnosis not present

## 2021-10-20 DIAGNOSIS — I69354 Hemiplegia and hemiparesis following cerebral infarction affecting left non-dominant side: Secondary | ICD-10-CM | POA: Diagnosis not present

## 2021-10-20 DIAGNOSIS — F418 Other specified anxiety disorders: Secondary | ICD-10-CM | POA: Diagnosis not present

## 2021-10-20 DIAGNOSIS — Z7901 Long term (current) use of anticoagulants: Secondary | ICD-10-CM | POA: Diagnosis not present

## 2021-10-20 DIAGNOSIS — M5441 Lumbago with sciatica, right side: Secondary | ICD-10-CM | POA: Diagnosis not present

## 2021-10-20 DIAGNOSIS — G4733 Obstructive sleep apnea (adult) (pediatric): Secondary | ICD-10-CM | POA: Diagnosis not present

## 2021-10-20 DIAGNOSIS — I110 Hypertensive heart disease with heart failure: Secondary | ICD-10-CM | POA: Diagnosis not present

## 2021-10-20 DIAGNOSIS — E669 Obesity, unspecified: Secondary | ICD-10-CM | POA: Diagnosis not present

## 2021-10-20 DIAGNOSIS — I2510 Atherosclerotic heart disease of native coronary artery without angina pectoris: Secondary | ICD-10-CM | POA: Diagnosis not present

## 2021-10-20 DIAGNOSIS — G8929 Other chronic pain: Secondary | ICD-10-CM | POA: Diagnosis not present

## 2021-10-20 DIAGNOSIS — Z8673 Personal history of transient ischemic attack (TIA), and cerebral infarction without residual deficits: Secondary | ICD-10-CM | POA: Diagnosis not present

## 2021-10-20 DIAGNOSIS — K219 Gastro-esophageal reflux disease without esophagitis: Secondary | ICD-10-CM | POA: Diagnosis not present

## 2021-10-20 DIAGNOSIS — M5410 Radiculopathy, site unspecified: Secondary | ICD-10-CM | POA: Diagnosis not present

## 2021-10-20 DIAGNOSIS — I69398 Other sequelae of cerebral infarction: Secondary | ICD-10-CM | POA: Diagnosis not present

## 2021-10-20 DIAGNOSIS — I5020 Unspecified systolic (congestive) heart failure: Secondary | ICD-10-CM | POA: Diagnosis not present

## 2021-10-20 DIAGNOSIS — M21372 Foot drop, left foot: Secondary | ICD-10-CM | POA: Diagnosis not present

## 2021-10-20 DIAGNOSIS — I739 Peripheral vascular disease, unspecified: Secondary | ICD-10-CM | POA: Diagnosis not present

## 2021-10-20 DIAGNOSIS — I081 Rheumatic disorders of both mitral and tricuspid valves: Secondary | ICD-10-CM | POA: Diagnosis not present

## 2021-10-22 DIAGNOSIS — I69354 Hemiplegia and hemiparesis following cerebral infarction affecting left non-dominant side: Secondary | ICD-10-CM | POA: Diagnosis not present

## 2021-10-22 DIAGNOSIS — I69398 Other sequelae of cerebral infarction: Secondary | ICD-10-CM | POA: Diagnosis not present

## 2021-10-22 DIAGNOSIS — I428 Other cardiomyopathies: Secondary | ICD-10-CM | POA: Diagnosis not present

## 2021-10-22 DIAGNOSIS — I110 Hypertensive heart disease with heart failure: Secondary | ICD-10-CM | POA: Diagnosis not present

## 2021-10-22 DIAGNOSIS — M21372 Foot drop, left foot: Secondary | ICD-10-CM | POA: Diagnosis not present

## 2021-10-22 DIAGNOSIS — I5020 Unspecified systolic (congestive) heart failure: Secondary | ICD-10-CM | POA: Diagnosis not present

## 2021-10-23 DIAGNOSIS — R7309 Other abnormal glucose: Secondary | ICD-10-CM | POA: Diagnosis not present

## 2021-10-23 DIAGNOSIS — Z961 Presence of intraocular lens: Secondary | ICD-10-CM | POA: Diagnosis not present

## 2021-10-23 DIAGNOSIS — H43813 Vitreous degeneration, bilateral: Secondary | ICD-10-CM | POA: Diagnosis not present

## 2021-10-24 ENCOUNTER — Ambulatory Visit (INDEPENDENT_AMBULATORY_CARE_PROVIDER_SITE_OTHER): Payer: Medicare Other | Admitting: Internal Medicine

## 2021-10-24 ENCOUNTER — Encounter: Payer: Self-pay | Admitting: Internal Medicine

## 2021-10-24 ENCOUNTER — Other Ambulatory Visit: Payer: Self-pay

## 2021-10-24 VITALS — BP 142/56 | HR 45 | Temp 98.1°F | Resp 16 | Ht 64.0 in | Wt 241.8 lb

## 2021-10-24 DIAGNOSIS — G4733 Obstructive sleep apnea (adult) (pediatric): Secondary | ICD-10-CM

## 2021-10-24 DIAGNOSIS — Z7189 Other specified counseling: Secondary | ICD-10-CM

## 2021-10-24 DIAGNOSIS — I63511 Cerebral infarction due to unspecified occlusion or stenosis of right middle cerebral artery: Secondary | ICD-10-CM | POA: Diagnosis not present

## 2021-10-24 DIAGNOSIS — I482 Chronic atrial fibrillation, unspecified: Secondary | ICD-10-CM | POA: Diagnosis not present

## 2021-10-24 DIAGNOSIS — I5022 Chronic systolic (congestive) heart failure: Secondary | ICD-10-CM

## 2021-10-24 DIAGNOSIS — R0602 Shortness of breath: Secondary | ICD-10-CM

## 2021-10-24 NOTE — Patient Instructions (Signed)
Sleep Apnea Sleep apnea affects breathing during sleep. It causes breathing to stop for 10 seconds or more, or to become shallow. People with sleep apnea usually snoreloudly. It can also increase the risk of: Heart attack. Stroke. Being very overweight (obese). Diabetes. Heart failure. Irregular heartbeat. High blood pressure. The goal of treatment is to help you breathe normally again. What are the causes?  The most common cause of this condition is a collapsed or blocked airway. There are three kinds of sleep apnea: Obstructive sleep apnea. This is caused by a blocked or collapsed airway. Central sleep apnea. This happens when the brain does not send the right signals to the muscles that control breathing. Mixed sleep apnea. This is a combination of obstructive and central sleep apnea. What increases the risk? Being overweight. Smoking. Having a small airway. Being older. Being female. Drinking alcohol. Taking medicines to calm yourself (sedatives or tranquilizers). Having family members with the condition. Having a tongue or tonsils that are larger than normal. What are the signs or symptoms? Trouble staying asleep. Loud snoring. Headaches in the morning. Waking up gasping. Dry mouth or sore throat in the morning. Being sleepy or tired during the day. If you are sleepy or tired during the day, you may also: Not be able to focus your mind (concentrate). Forget things. Get angry a lot and have mood swings. Feel sad (depressed). Have changes in your personality. Have less interest in sex, if you are female. Be unable to have an erection, if you are female. How is this treated?  Sleeping on your side. Using a medicine to get rid of mucus in your nose (decongestant). Avoiding the use of alcohol, medicines to help you relax, or certain pain medicines (narcotics). Losing weight, if needed. Changing your diet. Quitting smoking. Using a machine to open your airway while you  sleep, such as: An oral appliance. This is a mouthpiece that shifts your lower jaw forward. A CPAP device. This device blows air through a mask when you breathe out (exhale). An EPAP device. This has valves that you put in each nostril. A BPAP device. This device blows air through a mask when you breathe in (inhale) and breathe out. Having surgery if other treatments do not work. Follow these instructions at home: Lifestyle Make changes that your doctor recommends. Eat a healthy diet. Lose weight if needed. Avoid alcohol, medicines to help you relax, and some pain medicines. Do not smoke or use any products that contain nicotine or tobacco. If you need help quitting, ask your doctor. General instructions Take over-the-counter and prescription medicines only as told by your doctor. If you were given a machine to use while you sleep, use it only as told by your doctor. If you are having surgery, make sure to tell your doctor you have sleep apnea. You may need to bring your device with you. Keep all follow-up visits. Contact a doctor if: The machine that you were given to use during sleep bothers you or does not seem to be working. You do not get better. You get worse. Get help right away if: Your chest hurts. You have trouble breathing in enough air. You have an uncomfortable feeling in your back, arms, or stomach. You have trouble talking. One side of your body feels weak. A part of your face is hanging down. These symptoms may be an emergency. Get help right away. Call your local emergency services (911 in the [place] Do not wait to see if the symptoms   will go away. Do not drive yourself to the hospital. Summary This condition affects breathing during sleep. The most common cause is a collapsed or blocked airway. The goal of treatment is to help you breathe normally while you sleep. This information is not intended to replace advice given to you by your health care provider. Make  sure you discuss any questions you have with your healthcare provider. Document Revised: 11/16/2020 Document Reviewed: 11/16/2020 Elsevier Patient Education  2022 [employer].  
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
## 2021-10-27 NOTE — Progress Notes (Signed)
SUBJECTIVE Patient presents to office today complaining of elongated, thickened nails that cause pain while ambulating in shoes.  She is unable to trim her own nails.  Patient has history of stroke left side approximately 3 months ago and is currently on anticoagulant medication.  Patient is here for further evaluation and treatment.  Past Medical History:  Diagnosis Date   Asthma    Chronic atrial fibrillation ([place])    a. diagnosed in 09/2016; b. failed flecainide and propafenone due to LE swelling and SOB, could not afford Multaq; c. CHADS2VASc => 5 (CHF, HTN, age x 1, nonobs CAD, female)--> Eliquis   COPD (chronic obstructive pulmonary disease) (HCC)    GERD (gastroesophageal reflux disease)    HFmrEF (heart failure with mid-range ejection fraction) ([place])    a. 12/2019 Echo: EF 40-45%.   Hyperlipidemia    Hypertension    NICM (nonischemic cardiomyopathy) ([place])    a. 12/2019 Echo: EF 40-45%, glob HK, mildly reduced RV fxn, sev dil LA. *HR 130 (afib) during study.   Nonobstructive CAD (coronary artery disease)    a. Lexiscan Myoview 10/2016: no evidence of ischemia, EF 53%; b. 02/2020 Cath: LM nl, LAD 20p, 76m, LCX 20ost, OM1/2/3 nl, LPDA nl, LPL1/2 nl, LPAV nl, RCA small, nl.   Obesity    Obstructive sleep apnea    Pulmonary hypertension ([place])    Sleep apnea    Stroke ([employer])    Systolic dysfunction    a. TTE 10/2016: EF 50%, mild LVH, moderately dilated LA, moderate MR/TR, mild pulmonary hypertension    OBJECTIVE General Patient is awake, alert, and oriented x 3 and in no acute distress. Derm Skin is dry and supple bilateral. Negative open lesions or macerations. Remaining integument unremarkable. Nails are tender, long, thickened and dystrophic with subungual debris, consistent with onychomycosis, 1-5 bilateral. No signs of infection noted. Vasc  DP and PT pedal pulses palpable bilaterally. Temperature gradient within normal limits.  Neuro Epicritic and protective threshold  sensation grossly intact bilaterally.  Musculoskeletal Exam No symptomatic pedal deformities noted bilateral. Muscular strength within normal limits.  ASSESSMENT 1.  Pain due to onychomycosis of toenails 1-5 bilateral 2.  Pain associated to a dystrophic toenail right hallux  PLAN OF CARE 1. Patient evaluated today.  2. Instructed to maintain good pedal hygiene and foot care.  3. Mechanical debridement of nails 1-5 bilaterally performed using a nail nipper. Filed with dremel without incident.  4.  Today we discussed different treatment options for the right hallux.  The hallux is very loosely adhered.  I recommended to the patient a total temporary nail avulsion to allow the new nail to grow in.  Patient agrees.  The toe was prepped in aseptic manner and digital block performed consisting of 3 mL of 2% lidocaine plain prior to the procedure.  The nail was avulsed in its entirety and light dressing applied.  Both verbal and written post care instructions were provided.   5.  Return to clinic in 3 mos.    Edrick Kins, DPM [facility]  Dr. Edrick Kins, [place]                                        [place], [place] [address]                Office [phone]  Fax [phone]

## 2021-10-28 ENCOUNTER — Ambulatory Visit: Payer: Medicare Other | Admitting: Nurse Practitioner

## 2021-10-31 ENCOUNTER — Telehealth: Payer: Self-pay

## 2021-10-31 NOTE — Telephone Encounter (Signed)
Sent request for ONO on pt to [place] at [facility]

## 2021-11-05 ENCOUNTER — Other Ambulatory Visit: Payer: Self-pay | Admitting: Family

## 2021-11-05 DIAGNOSIS — I110 Hypertensive heart disease with heart failure: Secondary | ICD-10-CM | POA: Diagnosis not present

## 2021-11-05 DIAGNOSIS — I69354 Hemiplegia and hemiparesis following cerebral infarction affecting left non-dominant side: Secondary | ICD-10-CM | POA: Diagnosis not present

## 2021-11-05 DIAGNOSIS — I69398 Other sequelae of cerebral infarction: Secondary | ICD-10-CM | POA: Diagnosis not present

## 2021-11-05 DIAGNOSIS — I428 Other cardiomyopathies: Secondary | ICD-10-CM | POA: Diagnosis not present

## 2021-11-05 DIAGNOSIS — M21372 Foot drop, left foot: Secondary | ICD-10-CM | POA: Diagnosis not present

## 2021-11-05 DIAGNOSIS — I5020 Unspecified systolic (congestive) heart failure: Secondary | ICD-10-CM | POA: Diagnosis not present

## 2021-11-05 NOTE — Telephone Encounter (Signed)
Rx request sent to pharmacy.  

## 2021-11-07 DIAGNOSIS — G4733 Obstructive sleep apnea (adult) (pediatric): Secondary | ICD-10-CM | POA: Diagnosis not present

## 2021-11-11 ENCOUNTER — Encounter: Payer: Self-pay | Admitting: Nurse Practitioner

## 2021-11-11 ENCOUNTER — Other Ambulatory Visit: Payer: Self-pay

## 2021-11-11 ENCOUNTER — Ambulatory Visit (INDEPENDENT_AMBULATORY_CARE_PROVIDER_SITE_OTHER): Payer: Medicare Other | Admitting: Nurse Practitioner

## 2021-11-11 VITALS — BP 113/60 | HR 72 | Temp 98.2°F | Resp 16 | Ht 64.0 in | Wt 252.4 lb

## 2021-11-11 DIAGNOSIS — F411 Generalized anxiety disorder: Secondary | ICD-10-CM | POA: Diagnosis not present

## 2021-11-11 DIAGNOSIS — R7303 Prediabetes: Secondary | ICD-10-CM

## 2021-11-11 DIAGNOSIS — J9611 Chronic respiratory failure with hypoxia: Secondary | ICD-10-CM

## 2021-11-11 DIAGNOSIS — Z79899 Other long term (current) drug therapy: Secondary | ICD-10-CM

## 2021-11-11 DIAGNOSIS — Z5181 Encounter for therapeutic drug level monitoring: Secondary | ICD-10-CM

## 2021-11-11 LAB — POCT GLYCOSYLATED HEMOGLOBIN (HGB A1C): Hemoglobin A1C: 6.5 % — AB (ref 4.0–5.6)

## 2021-11-11 MED ORDER — ALPRAZOLAM 0.25 MG PO TABS: 0.2500 mg | ORAL_TABLET | Freq: Two times a day (BID) | ORAL | 1 refills | Status: DC | PRN

## 2021-11-11 MED ORDER — RYBELSUS 3 MG PO TABS: ORAL_TABLET | ORAL | 3 refills | Status: DC

## 2021-11-11 MED ORDER — THEO-24 100 MG PO CP24: ORAL_CAPSULE | ORAL | 3 refills | Status: DC
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
## 2021-11-12 DIAGNOSIS — M25511 Pain in right shoulder: Secondary | ICD-10-CM | POA: Diagnosis not present

## 2021-11-12 DIAGNOSIS — G4733 Obstructive sleep apnea (adult) (pediatric): Secondary | ICD-10-CM

## 2021-11-15 DIAGNOSIS — M21372 Foot drop, left foot: Secondary | ICD-10-CM | POA: Diagnosis not present

## 2021-11-15 DIAGNOSIS — I110 Hypertensive heart disease with heart failure: Secondary | ICD-10-CM | POA: Diagnosis not present

## 2021-11-15 DIAGNOSIS — I428 Other cardiomyopathies: Secondary | ICD-10-CM | POA: Diagnosis not present

## 2021-11-15 DIAGNOSIS — I69398 Other sequelae of cerebral infarction: Secondary | ICD-10-CM | POA: Diagnosis not present

## 2021-11-15 DIAGNOSIS — I69354 Hemiplegia and hemiparesis following cerebral infarction affecting left non-dominant side: Secondary | ICD-10-CM | POA: Diagnosis not present

## 2021-11-15 DIAGNOSIS — I5020 Unspecified systolic (congestive) heart failure: Secondary | ICD-10-CM | POA: Diagnosis not present

## 2021-11-18 ENCOUNTER — Other Ambulatory Visit: Payer: Self-pay | Admitting: Family

## 2021-11-18 DIAGNOSIS — I25118 Atherosclerotic heart disease of native coronary artery with other forms of angina pectoris: Secondary | ICD-10-CM

## 2021-11-18 DIAGNOSIS — Z8673 Personal history of transient ischemic attack (TIA), and cerebral infarction without residual deficits: Secondary | ICD-10-CM

## 2021-11-18 DIAGNOSIS — E785 Hyperlipidemia, unspecified: Secondary | ICD-10-CM

## 2021-11-19 ENCOUNTER — Telehealth: Payer: Self-pay | Admitting: Adult Health

## 2021-11-19 DIAGNOSIS — I2510 Atherosclerotic heart disease of native coronary artery without angina pectoris: Secondary | ICD-10-CM | POA: Diagnosis not present

## 2021-11-19 DIAGNOSIS — G8929 Other chronic pain: Secondary | ICD-10-CM | POA: Diagnosis not present

## 2021-11-19 DIAGNOSIS — I5020 Unspecified systolic (congestive) heart failure: Secondary | ICD-10-CM | POA: Diagnosis not present

## 2021-11-19 DIAGNOSIS — J449 Chronic obstructive pulmonary disease, unspecified: Secondary | ICD-10-CM | POA: Diagnosis not present

## 2021-11-19 DIAGNOSIS — M5410 Radiculopathy, site unspecified: Secondary | ICD-10-CM | POA: Diagnosis not present

## 2021-11-19 DIAGNOSIS — Z7901 Long term (current) use of anticoagulants: Secondary | ICD-10-CM | POA: Diagnosis not present

## 2021-11-19 DIAGNOSIS — I110 Hypertensive heart disease with heart failure: Secondary | ICD-10-CM | POA: Diagnosis not present

## 2021-11-19 DIAGNOSIS — I739 Peripheral vascular disease, unspecified: Secondary | ICD-10-CM | POA: Diagnosis not present

## 2021-11-19 DIAGNOSIS — M21372 Foot drop, left foot: Secondary | ICD-10-CM | POA: Diagnosis not present

## 2021-11-19 DIAGNOSIS — I482 Chronic atrial fibrillation, unspecified: Secondary | ICD-10-CM | POA: Diagnosis not present

## 2021-11-19 DIAGNOSIS — Z7985 Long-term (current) use of injectable non-insulin antidiabetic drugs: Secondary | ICD-10-CM | POA: Diagnosis not present

## 2021-11-19 DIAGNOSIS — I428 Other cardiomyopathies: Secondary | ICD-10-CM | POA: Diagnosis not present

## 2021-11-19 DIAGNOSIS — G4733 Obstructive sleep apnea (adult) (pediatric): Secondary | ICD-10-CM | POA: Diagnosis not present

## 2021-11-19 DIAGNOSIS — I69354 Hemiplegia and hemiparesis following cerebral infarction affecting left non-dominant side: Secondary | ICD-10-CM | POA: Diagnosis not present

## 2021-11-19 DIAGNOSIS — F418 Other specified anxiety disorders: Secondary | ICD-10-CM | POA: Diagnosis not present

## 2021-11-19 DIAGNOSIS — Z79891 Long term (current) use of opiate analgesic: Secondary | ICD-10-CM | POA: Diagnosis not present

## 2021-11-19 DIAGNOSIS — Z7952 Long term (current) use of systemic steroids: Secondary | ICD-10-CM | POA: Diagnosis not present

## 2021-11-19 DIAGNOSIS — I272 Pulmonary hypertension, unspecified: Secondary | ICD-10-CM | POA: Diagnosis not present

## 2021-11-19 DIAGNOSIS — I081 Rheumatic disorders of both mitral and tricuspid valves: Secondary | ICD-10-CM | POA: Diagnosis not present

## 2021-11-19 DIAGNOSIS — M5441 Lumbago with sciatica, right side: Secondary | ICD-10-CM | POA: Diagnosis not present

## 2021-11-19 DIAGNOSIS — I69398 Other sequelae of cerebral infarction: Secondary | ICD-10-CM | POA: Diagnosis not present

## 2021-11-19 DIAGNOSIS — Z9181 History of falling: Secondary | ICD-10-CM | POA: Diagnosis not present

## 2021-11-19 DIAGNOSIS — E785 Hyperlipidemia, unspecified: Secondary | ICD-10-CM | POA: Diagnosis not present

## 2021-11-19 DIAGNOSIS — K219 Gastro-esophageal reflux disease without esophagitis: Secondary | ICD-10-CM | POA: Diagnosis not present

## 2021-11-19 NOTE — Telephone Encounter (Signed)
[facility] Jenny Reichmann) requesting verbal orders for PT. Frequency:1x wk 9 wks Would like a call from the nurse.

## 2021-11-19 NOTE — Telephone Encounter (Signed)
Called [place], PT and LVM giving verbal orders as she recommended. Left # for questions.

## 2021-11-20 DIAGNOSIS — M75121 Complete rotator cuff tear or rupture of right shoulder, not specified as traumatic: Secondary | ICD-10-CM | POA: Diagnosis not present

## 2021-11-20 DIAGNOSIS — S46211A Strain of muscle, fascia and tendon of other parts of biceps, right arm, initial encounter: Secondary | ICD-10-CM | POA: Diagnosis not present

## 2021-11-20 DIAGNOSIS — M19011 Primary osteoarthritis, right shoulder: Secondary | ICD-10-CM | POA: Diagnosis not present

## 2021-11-20 DIAGNOSIS — M778 Other enthesopathies, not elsewhere classified: Secondary | ICD-10-CM | POA: Diagnosis not present

## 2021-11-20 DIAGNOSIS — M25511 Pain in right shoulder: Secondary | ICD-10-CM | POA: Diagnosis not present

## 2021-11-22 DIAGNOSIS — I428 Other cardiomyopathies: Secondary | ICD-10-CM | POA: Diagnosis not present

## 2021-11-22 DIAGNOSIS — I110 Hypertensive heart disease with heart failure: Secondary | ICD-10-CM | POA: Diagnosis not present

## 2021-11-22 DIAGNOSIS — I5020 Unspecified systolic (congestive) heart failure: Secondary | ICD-10-CM | POA: Diagnosis not present

## 2021-11-22 DIAGNOSIS — M21372 Foot drop, left foot: Secondary | ICD-10-CM | POA: Diagnosis not present

## 2021-11-22 DIAGNOSIS — M25811 Other specified joint disorders, right shoulder: Secondary | ICD-10-CM | POA: Diagnosis not present

## 2021-11-22 DIAGNOSIS — I69354 Hemiplegia and hemiparesis following cerebral infarction affecting left non-dominant side: Secondary | ICD-10-CM | POA: Diagnosis not present

## 2021-11-22 DIAGNOSIS — I69398 Other sequelae of cerebral infarction: Secondary | ICD-10-CM | POA: Diagnosis not present

## 2021-11-25 ENCOUNTER — Telehealth: Payer: Self-pay

## 2021-11-25 NOTE — Telephone Encounter (Signed)
Order for oxygen supplies signed by provider and placed in [place] folder.

## 2021-11-26 ENCOUNTER — Telehealth: Payer: Self-pay | Admitting: Cardiovascular Disease

## 2021-11-26 MED ORDER — DILTIAZEM HCL ER COATED BEADS 360 MG PO CP24: ORAL_CAPSULE | ORAL | 0 refills | Status: DC

## 2021-11-26 NOTE — Addendum Note (Signed)
Addended by: Lamar Laundry on: 11/26/2021 11:05 AM   Modules accepted: Orders

## 2021-11-26 NOTE — Telephone Encounter (Signed)
*  STAT* If patient is at the pharmacy, call can be transferred to refill team.   1. Which medications need to be refilled? (please list name of each medication and dose if known)   Diltiazem 360 mg q 24 capsule   2. Which pharmacy/location (including street and city if local pharmacy) is medication to be sent to? [facility]   3. Do they need a 30 day or 90 day supply? 90    Patient not sure what to do . She is out of town until Sunday and mail order did not get refill so now new order refill with take 2 weeks.   Patient states she has at least 90 day supply of old rx ( diltiazem 100 mg tablet ) with her if she needs to use them .   Patient did not have bottle in hand to confirm old medication rx but wants advise on if she can take them until refill arrives in mail in 2 weeks    [place] to refill and rn

## 2021-11-26 NOTE — Telephone Encounter (Addendum)
Called the patient. Patients sts that she did not receive her refill for Diltiazem 360 mg prior to her leaving for out of town and is she is currently out of her medication.  Adv her that Lenda Kelp, [place] has sent in #90 today to her mail order pharmacy. Patient rqst a 30 day supply be sent to a local pharmacy where she is visiting. #30 sent to [facility], [place] [address]  Patient voiced appreciation for the assistance.

## 2021-11-26 NOTE — Telephone Encounter (Signed)
Requested Prescriptions   Signed Prescriptions Disp Refills   diltiazem (CARDIZEM CD) 360 MG 24 hr capsule 90 capsule 0    Sig: TAKE 1 CAPSULE BY MOUTH EVERY DAY    Authorizing Provider: Kathlyn [place] A    Ordering User: Britt Bottom

## 2021-11-27 ENCOUNTER — Telehealth: Payer: Self-pay

## 2021-11-27 NOTE — Telephone Encounter (Signed)
Oxygen therapy order signed by provider and placed in [facility] folder. 

## 2021-12-02 ENCOUNTER — Ambulatory Visit (INDEPENDENT_AMBULATORY_CARE_PROVIDER_SITE_OTHER): Payer: Medicare Other | Admitting: Vascular Surgery

## 2021-12-02 ENCOUNTER — Other Ambulatory Visit: Payer: Self-pay

## 2021-12-02 ENCOUNTER — Encounter (INDEPENDENT_AMBULATORY_CARE_PROVIDER_SITE_OTHER): Payer: Self-pay | Admitting: Vascular Surgery

## 2021-12-02 VITALS — BP 134/81 | HR 76 | Resp 16 | Wt 247.4 lb

## 2021-12-02 DIAGNOSIS — I872 Venous insufficiency (chronic) (peripheral): Secondary | ICD-10-CM | POA: Diagnosis not present

## 2021-12-02 DIAGNOSIS — J9611 Chronic respiratory failure with hypoxia: Secondary | ICD-10-CM

## 2021-12-02 DIAGNOSIS — I10 Essential (primary) hypertension: Secondary | ICD-10-CM

## 2021-12-02 DIAGNOSIS — E785 Hyperlipidemia, unspecified: Secondary | ICD-10-CM | POA: Diagnosis not present

## 2021-12-02 DIAGNOSIS — I63511 Cerebral infarction due to unspecified occlusion or stenosis of right middle cerebral artery: Secondary | ICD-10-CM | POA: Diagnosis not present

## 2021-12-02 DIAGNOSIS — I4891 Unspecified atrial fibrillation: Secondary | ICD-10-CM | POA: Diagnosis not present

## 2021-12-02 NOTE — Progress Notes (Signed)
MRN : [id number]  Laura [name] is a 70 y.o. (02-18-1951) female who presents with chief complaint of check legs.  History of Present Illness:  The patient returns to the office for followup evaluation regarding leg swelling.  The swelling has improved quite a bit and the pain associated with swelling has decreased substantially. There have not been any interval development of a ulcerations or wounds.  Since the previous visit the patient has been wearing graduated compression stockings and has noted little significant improvement in the lymphedema. The patient has been using compression routinely morning until night.  The patient also states elevation during the day and exercise is being done too.  She has been receiving epidural injections and her leg pain is much better     No outpatient medications have been marked as taking for the 12/02/21 encounter (Appointment) with Delana Meyer, Dolores Lory, MD.    Past Medical History:  Diagnosis Date   Asthma    Chronic atrial fibrillation ([place])    a. diagnosed in 09/2016; b. failed flecainide and propafenone due to LE swelling and SOB, could not afford Multaq; c. CHADS2VASc => 5 (CHF, HTN, age x 1, nonobs CAD, female)--> Eliquis   COPD (chronic obstructive pulmonary disease) (HCC)    GERD (gastroesophageal reflux disease)    HFmrEF (heart failure with mid-range ejection fraction) ([place])    a. 12/2019 Echo: EF 40-45%.   Hyperlipidemia    Hypertension    NICM (nonischemic cardiomyopathy) ([place])    a. 12/2019 Echo: EF 40-45%, glob HK, mildly reduced RV fxn, sev dil LA. *HR 130 (afib) during study.   Nonobstructive CAD (coronary artery disease)    a. Lexiscan Myoview 10/2016: no evidence of ischemia, EF 53%; b. 02/2020 Cath: LM nl, LAD 20p, 20m, LCX 20ost, OM1/2/3 nl, LPDA nl, LPL1/2 nl, LPAV nl, RCA small, nl.   Obesity    Obstructive sleep apnea    Pulmonary hypertension ([place])    Sleep apnea    Stroke ([place])    Systolic dysfunction    a. TTE  10/2016: EF 50%, mild LVH, moderately dilated LA, moderate MR/TR, mild pulmonary hypertension    Past Surgical History:  Procedure Laterality Date   BOWEL RESECTION  09/11/2019   Procedure: SMALL BOWEL RESECTION;  Surgeon: Herbert Pun, MD;  Location: [facility] ORS;  Service: General;;   CARDIAC CATHETERIZATION     cataract surgery     COLONOSCOPY WITH PROPOFOL N/A 03/19/2020   Procedure: COLONOSCOPY WITH PROPOFOL;  Surgeon: [name], MD;  Location: [facility] ENDOSCOPY;  Service: Gastroenterology;  Laterality: N/A;   CORONARY ANGIOPLASTY     INCISION AND DRAINAGE ABSCESS Right 06/29/2016   Procedure: INCISION AND DRAINAGE ABSCESS;  Surgeon: Florene Glen, MD;  Location: [facility] ORS;  Service: General;  Laterality: Right;   INCISION AND DRAINAGE OF WOUND Left 06/29/2016   Procedure: IRRIGATION AND DEBRIDEMENT WOUND;  Surgeon: Florene Glen, MD;  Location: [facility] ORS;  Service: General;  Laterality: Left;   IR ANGIO VERTEBRAL SEL SUBCLAVIAN INNOMINATE UNI R MOD SED  11/20/2020   IR CT HEAD LTD  11/20/2020   IR PERCUTANEOUS ART THROMBECTOMY/INFUSION INTRACRANIAL INC DIAG ANGIO  11/20/2020   LAPAROSCOPIC RIGHT COLECTOMY  09/11/2019   Procedure: RIGHT COLECTOMY;  Surgeon: Herbert Pun, MD;  Location: [facility];  Service: General;;   LAPAROSCOPY N/A 09/11/2019   Procedure: LAPAROSCOPY DIAGNOSTIC;  Surgeon: Herbert Pun, MD;  Location: [facility];  Service: General;  Laterality: N/A;   LAPAROTOMY N/A 09/13/2019  Procedure: REOPENING OF RECENT LAPAROTOMYANASTOMOSIS OF BOWEL;  Surgeon: Herbert Pun, MD;  Location: [facility];  Service: General;  Laterality: N/A;   RADIOLOGY WITH ANESTHESIA N/A 11/20/2020   Procedure: IR WITH ANESTHESIA - CODE STROKE;  Surgeon: Radiologist, Medication, MD;  Location: [facility];  Service: Radiology;  Laterality: N/A;   RIGHT/LEFT HEART CATH AND CORONARY ANGIOGRAPHY Bilateral 02/27/2020   Procedure: RIGHT/LEFT HEART CATH AND CORONARY ANGIOGRAPHY;  Surgeon:  Wellington Hampshire, MD;  Location: [facility];  Service: Cardiovascular;  Laterality: Bilateral;   VISCERAL ANGIOGRAPHY N/A 09/12/2019   Procedure: VISCERAL ANGIOGRAPHY;  Surgeon: Algernon Huxley, MD;  Location: [facility];  Service: Cardiovascular;  Laterality: N/A;    Social History Social History   Tobacco Use   Smoking status: Never   Smokeless tobacco: Never  Vaping Use   Vaping Use: Never used  Substance Use Topics   Alcohol use: No   Drug use: No    Family History Family History  Problem Relation Age of Onset   Dementia Mother    Osteoporosis Mother    Vascular Disease Mother    COPD Father    Heart disease Brother    Cancer Daughter     Allergies  Allergen Reactions   Flecainide Shortness Of Breath   Metoprolol Shortness Of Breath, Swelling and Other (See Comments)    "My limbs swell" also   Propafenone Shortness Of Breath, Swelling and Other (See Comments)    "My limbs swell" also   Rivaroxaban Swelling and Other (See Comments)    Xarelto- "My limbs swell" Other reaction(s): Muscle Pain, Other (See Comments) Other reaction(s): Other (See Comments) Xarelto- "My limbs swell"     REVIEW OF SYSTEMS (Negative unless checked)  Constitutional: [] Weight loss  [] Fever  [] Chills Cardiac: [] Chest pain   [] Chest pressure   [] Palpitations   [] Shortness of breath when laying flat   [] Shortness of breath with exertion. Vascular:  [] Pain in legs with walking   [] Pain in legs at rest  [] History of DVT   [] Phlebitis   [x] Swelling in legs   [] Varicose veins   [] Non-healing ulcers Pulmonary:   [] Uses home oxygen   [] Productive cough   [] Hemoptysis   [] Wheeze  [] COPD   [] Asthma Neurologic:  [] Dizziness   [] Seizures   [] History of stroke   [] History of TIA  [] Aphasia   [] Vissual changes   [] Weakness or numbness in arm   [] Weakness or numbness in leg Musculoskeletal:   [] Joint swelling   [] Joint pain   [] Low back pain Hematologic:  [] Easy bruising  [] Easy bleeding    [] Hypercoagulable state   [] Anemic Gastrointestinal:  [] Diarrhea   [] Vomiting  [] Gastroesophageal reflux/heartburn   [] Difficulty swallowing. Genitourinary:  [] Chronic kidney disease   [] Difficult urination  [] Frequent urination   [] Blood in urine Skin:  [] Rashes   [] Ulcers  Psychological:  [] History of anxiety   []  History of major depression.  Physical Examination  There were no vitals filed for this visit. There is no height or weight on file to calculate BMI. Gen: WD/WN, NAD Head: [place]/AT, No temporalis wasting.  Ear/Nose/Throat: Hearing grossly intact, nares w/o erythema or drainage, pinna without lesions Eyes: PER, EOMI, sclera nonicteric.  Neck: Supple, no gross masses.  No JVD.  Pulmonary:  Good air movement, no audible wheezing, no use of accessory muscles.  Cardiac: RRR, precordium not hyperdynamic. Vascular:  scattered varicosities present bilaterally.  Mild venous stasis changes to the legs bilaterally.  1+ soft pitting edema  Vessel Right Left  Radial Palpable Palpable  Gastrointestinal: soft, non-distended. No guarding/no peritoneal signs.  Musculoskeletal: M/S 5/5 throughout.  No deformity.  Neurologic: CN 2-12 intact. Pain and light touch intact in extremities.  Symmetrical.  Speech is fluent. Motor exam as listed above. Psychiatric: Judgment intact, Mood & affect appropriate for pt's clinical situation. Dermatologic: Venous rashes no ulcers noted.  No changes consistent with cellulitis. Lymph : No lichenification or skin changes of chronic lymphedema.  CBC Lab Results  Component Value Date   WBC 9.3 12/01/2020   HGB 15.7 (H) 12/01/2020   HCT 46.9 (H) 12/01/2020   MCV 90.0 12/01/2020   PLT 379 12/01/2020    BMET    Component Value Date/Time   NA 143 09/13/2021 1152   NA 140 11/28/2013 2312   K 4.1 09/13/2021 1152   K 4.1 11/28/2013 2312   CL 104 09/13/2021 1152   CL 108 (H) 11/28/2013 2312   CO2 21 09/13/2021 1152   CO2 27 11/28/2013 2312   GLUCOSE 100  (H) 09/13/2021 1152   GLUCOSE 112 (H) 02/26/2021 1127   GLUCOSE 130 (H) 11/28/2013 2312   BUN 17 09/13/2021 1152   BUN 29 (H) 11/28/2013 2312   CREATININE 0.89 09/13/2021 1152   CREATININE 0.79 11/28/2013 2312   CALCIUM 8.8 09/13/2021 1152   CALCIUM 8.7 11/28/2013 2312   GFRNONAA >60 02/26/2021 1127   GFRNONAA >60 11/28/2013 2312   GFRAA 80 12/04/2020 1517   GFRAA >60 11/28/2013 2312   CrCl cannot be calculated (Patient's most recent lab result is older than the maximum 21 days allowed.).  COAG Lab Results  Component Value Date   INR 1.2 11/20/2020   INR 3.6 (A) 06/13/2020   INR 3.9 (A) 06/11/2020    Radiology No results found.   Assessment/Plan 1. Chronic venous insufficiency No surgery or intervention at this point in time.  I have reviewed my discussion with the patient regarding venous insufficiency and why it causes symptoms. I have discussed with the patient the chronic skin changes that accompany venous insufficiency and the long term sequela such as ulceration. Patient will contnue wearing graduated compression stockings on a daily basis, as this has provided excellent control of his edema. The patient will put the stockings on first thing in the morning and removing them in the evening. The patient is reminded not to sleep in the stockings.  In addition, behavioral modification including elevation during the day will be initiated. Exercise is strongly encouraged.  Given the patient's good control and lack of any problems regarding the venous insufficiency and lymphedema a lymph pump in not need at this time.  The patient will follow up with me PRN should anything change.  The patient voices agreement with this plan.   2. Atrial fibrillation with rapid ventricular response (HCC) Continue antiarrhythmia medications as already ordered, these medications have been reviewed and there are no changes at this time.  Continue anticoagulation as ordered by Cardiology Service    3. Essential hypertension Continue antihypertensive medications as already ordered, these medications have been reviewed and there are no changes at this time.   4. Chronic respiratory failure with hypoxia (HCC) Continue pulmonary medications and aerosols as already ordered, these medications have been reviewed and there are no changes at this time.    5. Hyperlipidemia LDL goal <70 Continue statin as ordered and reviewed, no changes at this time.  Hortencia Pilar, MD  12/02/2021 4:35 PM

## 2021-12-04 DIAGNOSIS — I428 Other cardiomyopathies: Secondary | ICD-10-CM | POA: Diagnosis not present

## 2021-12-04 DIAGNOSIS — I110 Hypertensive heart disease with heart failure: Secondary | ICD-10-CM | POA: Diagnosis not present

## 2021-12-04 DIAGNOSIS — M21372 Foot drop, left foot: Secondary | ICD-10-CM | POA: Diagnosis not present

## 2021-12-04 DIAGNOSIS — I5020 Unspecified systolic (congestive) heart failure: Secondary | ICD-10-CM | POA: Diagnosis not present

## 2021-12-04 DIAGNOSIS — I69354 Hemiplegia and hemiparesis following cerebral infarction affecting left non-dominant side: Secondary | ICD-10-CM | POA: Diagnosis not present

## 2021-12-04 DIAGNOSIS — I69398 Other sequelae of cerebral infarction: Secondary | ICD-10-CM | POA: Diagnosis not present

## 2021-12-07 ENCOUNTER — Encounter (INDEPENDENT_AMBULATORY_CARE_PROVIDER_SITE_OTHER): Payer: Self-pay | Admitting: Vascular Surgery

## 2021-12-09 ENCOUNTER — Encounter: Payer: Self-pay | Admitting: Cardiovascular Disease

## 2021-12-09 ENCOUNTER — Encounter: Payer: Self-pay | Admitting: Nurse Practitioner

## 2021-12-09 ENCOUNTER — Other Ambulatory Visit: Payer: Self-pay

## 2021-12-09 ENCOUNTER — Ambulatory Visit (INDEPENDENT_AMBULATORY_CARE_PROVIDER_SITE_OTHER): Payer: Medicare Other | Admitting: Nurse Practitioner

## 2021-12-09 VITALS — BP 129/67 | HR 70 | Temp 98.1°F | Resp 16 | Ht 64.5 in | Wt 249.4 lb

## 2021-12-09 DIAGNOSIS — E661 Drug-induced obesity: Secondary | ICD-10-CM

## 2021-12-09 DIAGNOSIS — R0602 Shortness of breath: Secondary | ICD-10-CM

## 2021-12-09 DIAGNOSIS — Z6841 Body Mass Index (BMI) 40.0 and over, adult: Secondary | ICD-10-CM

## 2021-12-09 DIAGNOSIS — G4733 Obstructive sleep apnea (adult) (pediatric): Secondary | ICD-10-CM | POA: Diagnosis not present

## 2021-12-09 DIAGNOSIS — R7303 Prediabetes: Secondary | ICD-10-CM
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
## 2021-12-10 DIAGNOSIS — I110 Hypertensive heart disease with heart failure: Secondary | ICD-10-CM | POA: Diagnosis not present

## 2021-12-10 DIAGNOSIS — M21372 Foot drop, left foot: Secondary | ICD-10-CM | POA: Diagnosis not present

## 2021-12-10 DIAGNOSIS — I5020 Unspecified systolic (congestive) heart failure: Secondary | ICD-10-CM | POA: Diagnosis not present

## 2021-12-10 DIAGNOSIS — I69354 Hemiplegia and hemiparesis following cerebral infarction affecting left non-dominant side: Secondary | ICD-10-CM | POA: Diagnosis not present

## 2021-12-10 DIAGNOSIS — I428 Other cardiomyopathies: Secondary | ICD-10-CM | POA: Diagnosis not present

## 2021-12-10 DIAGNOSIS — I69398 Other sequelae of cerebral infarction: Secondary | ICD-10-CM | POA: Diagnosis not present

## 2021-12-17 ENCOUNTER — Encounter: Payer: Self-pay | Admitting: Internal Medicine

## 2021-12-19 DIAGNOSIS — I110 Hypertensive heart disease with heart failure: Secondary | ICD-10-CM | POA: Diagnosis not present

## 2021-12-19 DIAGNOSIS — E785 Hyperlipidemia, unspecified: Secondary | ICD-10-CM | POA: Diagnosis not present

## 2021-12-19 DIAGNOSIS — I272 Pulmonary hypertension, unspecified: Secondary | ICD-10-CM | POA: Diagnosis not present

## 2021-12-19 DIAGNOSIS — J449 Chronic obstructive pulmonary disease, unspecified: Secondary | ICD-10-CM | POA: Diagnosis not present

## 2021-12-19 DIAGNOSIS — I69398 Other sequelae of cerebral infarction: Secondary | ICD-10-CM | POA: Diagnosis not present

## 2021-12-19 DIAGNOSIS — Z7901 Long term (current) use of anticoagulants: Secondary | ICD-10-CM | POA: Diagnosis not present

## 2021-12-19 DIAGNOSIS — K219 Gastro-esophageal reflux disease without esophagitis: Secondary | ICD-10-CM | POA: Diagnosis not present

## 2021-12-19 DIAGNOSIS — M5441 Lumbago with sciatica, right side: Secondary | ICD-10-CM | POA: Diagnosis not present

## 2021-12-19 DIAGNOSIS — F418 Other specified anxiety disorders: Secondary | ICD-10-CM | POA: Diagnosis not present

## 2021-12-19 DIAGNOSIS — I081 Rheumatic disorders of both mitral and tricuspid valves: Secondary | ICD-10-CM | POA: Diagnosis not present

## 2021-12-19 DIAGNOSIS — I2510 Atherosclerotic heart disease of native coronary artery without angina pectoris: Secondary | ICD-10-CM | POA: Diagnosis not present

## 2021-12-19 DIAGNOSIS — Z79891 Long term (current) use of opiate analgesic: Secondary | ICD-10-CM | POA: Diagnosis not present

## 2021-12-19 DIAGNOSIS — I739 Peripheral vascular disease, unspecified: Secondary | ICD-10-CM | POA: Diagnosis not present

## 2021-12-19 DIAGNOSIS — Z7985 Long-term (current) use of injectable non-insulin antidiabetic drugs: Secondary | ICD-10-CM | POA: Diagnosis not present

## 2021-12-19 DIAGNOSIS — M21372 Foot drop, left foot: Secondary | ICD-10-CM | POA: Diagnosis not present

## 2021-12-19 DIAGNOSIS — Z9181 History of falling: Secondary | ICD-10-CM | POA: Diagnosis not present

## 2021-12-19 DIAGNOSIS — I482 Chronic atrial fibrillation, unspecified: Secondary | ICD-10-CM | POA: Diagnosis not present

## 2021-12-19 DIAGNOSIS — I5020 Unspecified systolic (congestive) heart failure: Secondary | ICD-10-CM | POA: Diagnosis not present

## 2021-12-19 DIAGNOSIS — G4733 Obstructive sleep apnea (adult) (pediatric): Secondary | ICD-10-CM | POA: Diagnosis not present

## 2021-12-19 DIAGNOSIS — I69354 Hemiplegia and hemiparesis following cerebral infarction affecting left non-dominant side: Secondary | ICD-10-CM | POA: Diagnosis not present

## 2021-12-19 DIAGNOSIS — M5410 Radiculopathy, site unspecified: Secondary | ICD-10-CM | POA: Diagnosis not present

## 2021-12-19 DIAGNOSIS — G8929 Other chronic pain: Secondary | ICD-10-CM | POA: Diagnosis not present

## 2021-12-19 DIAGNOSIS — Z7952 Long term (current) use of systemic steroids: Secondary | ICD-10-CM | POA: Diagnosis not present

## 2021-12-19 DIAGNOSIS — I428 Other cardiomyopathies: Secondary | ICD-10-CM | POA: Diagnosis not present

## 2021-12-24 ENCOUNTER — Encounter: Payer: Self-pay | Admitting: Nurse Practitioner

## 2021-12-24 ENCOUNTER — Telehealth (INDEPENDENT_AMBULATORY_CARE_PROVIDER_SITE_OTHER): Payer: Medicare Other | Admitting: Nurse Practitioner

## 2021-12-24 VITALS — HR 60 | Temp 97.0°F | Ht 64.0 in | Wt 248.0 lb

## 2021-12-24 DIAGNOSIS — J441 Chronic obstructive pulmonary disease with (acute) exacerbation: Secondary | ICD-10-CM | POA: Diagnosis not present

## 2021-12-24 DIAGNOSIS — R051 Acute cough: Secondary | ICD-10-CM | POA: Diagnosis not present

## 2021-12-24 DIAGNOSIS — J0110 Acute frontal sinusitis, unspecified: Secondary | ICD-10-CM | POA: Diagnosis not present

## 2021-12-24 DIAGNOSIS — B3731 Acute candidiasis of vulva and vagina: Secondary | ICD-10-CM | POA: Diagnosis not present

## 2021-12-24 MED ORDER — PREDNISONE 10 MG PO TABS: ORAL_TABLET | ORAL | 0 refills | Status: DC

## 2021-12-24 MED ORDER — LEVOFLOXACIN 750 MG PO TABS: 750.0000 mg | ORAL_TABLET | Freq: Every day | ORAL | 0 refills | Status: DC

## 2021-12-24 MED ORDER — FLUCONAZOLE 150 MG PO TABS: 150.0000 mg | ORAL_TABLET | Freq: Once | ORAL | 0 refills | Status: AC

## 2021-12-24 MED ORDER — BENZONATATE 100 MG PO CAPS: 100.0000 mg | ORAL_CAPSULE | Freq: Two times a day (BID) | ORAL | 2 refills | Status: DC | PRN
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
## 2021-12-25 DIAGNOSIS — I110 Hypertensive heart disease with heart failure: Secondary | ICD-10-CM | POA: Diagnosis not present

## 2021-12-25 DIAGNOSIS — I5020 Unspecified systolic (congestive) heart failure: Secondary | ICD-10-CM | POA: Diagnosis not present

## 2021-12-25 DIAGNOSIS — I69354 Hemiplegia and hemiparesis following cerebral infarction affecting left non-dominant side: Secondary | ICD-10-CM | POA: Diagnosis not present

## 2021-12-25 DIAGNOSIS — I428 Other cardiomyopathies: Secondary | ICD-10-CM | POA: Diagnosis not present

## 2021-12-25 DIAGNOSIS — M21372 Foot drop, left foot: Secondary | ICD-10-CM | POA: Diagnosis not present

## 2021-12-25 DIAGNOSIS — I69398 Other sequelae of cerebral infarction: Secondary | ICD-10-CM | POA: Diagnosis not present

## 2021-12-26 ENCOUNTER — Ambulatory Visit: Payer: Medicare Other | Admitting: Cardiovascular Disease

## 2021-12-31 DIAGNOSIS — M21372 Foot drop, left foot: Secondary | ICD-10-CM | POA: Diagnosis not present

## 2021-12-31 DIAGNOSIS — I69354 Hemiplegia and hemiparesis following cerebral infarction affecting left non-dominant side: Secondary | ICD-10-CM | POA: Diagnosis not present

## 2021-12-31 DIAGNOSIS — I69398 Other sequelae of cerebral infarction: Secondary | ICD-10-CM | POA: Diagnosis not present

## 2021-12-31 DIAGNOSIS — I110 Hypertensive heart disease with heart failure: Secondary | ICD-10-CM | POA: Diagnosis not present

## 2021-12-31 DIAGNOSIS — I5020 Unspecified systolic (congestive) heart failure: Secondary | ICD-10-CM | POA: Diagnosis not present

## 2021-12-31 DIAGNOSIS — I428 Other cardiomyopathies: Secondary | ICD-10-CM | POA: Diagnosis not present

## 2022-01-03 ENCOUNTER — Encounter: Payer: Self-pay | Admitting: Cardiovascular Disease

## 2022-01-03 ENCOUNTER — Other Ambulatory Visit
Admission: RE | Admit: 2022-01-03 | Discharge: 2022-01-03 | Disposition: A | Discharge status: Home / Self Care | Payer: Medicare Other | Attending: Cardiovascular Disease | Admitting: Cardiovascular Disease

## 2022-01-03 ENCOUNTER — Ambulatory Visit (INDEPENDENT_AMBULATORY_CARE_PROVIDER_SITE_OTHER): Payer: Medicare Other | Admitting: Cardiovascular Disease

## 2022-01-03 ENCOUNTER — Other Ambulatory Visit: Payer: Self-pay

## 2022-01-03 VITALS — BP 100/60 | HR 61 | Ht 64.0 in | Wt 246.4 lb

## 2022-01-03 DIAGNOSIS — I482 Chronic atrial fibrillation, unspecified: Secondary | ICD-10-CM

## 2022-01-03 DIAGNOSIS — I2510 Atherosclerotic heart disease of native coronary artery without angina pectoris: Secondary | ICD-10-CM | POA: Diagnosis not present

## 2022-01-03 DIAGNOSIS — I10 Essential (primary) hypertension: Secondary | ICD-10-CM | POA: Diagnosis not present

## 2022-01-03 DIAGNOSIS — I5022 Chronic systolic (congestive) heart failure: Secondary | ICD-10-CM

## 2022-01-03 DIAGNOSIS — G4730 Sleep apnea, unspecified: Secondary | ICD-10-CM

## 2022-01-03 LAB — COMPREHENSIVE METABOLIC PANEL
ALT: 23 U/L (ref 0–44)
AST: 19 U/L (ref 15–41)
Albumin: 4.1 g/dL (ref 3.5–5.0)
Alkaline Phosphatase: 46 U/L (ref 38–126)
Anion gap: 11 (ref 5–15)
BUN: 19 mg/dL (ref 8–23)
CO2: 25 mmol/L (ref 22–32)
Calcium: 9.4 mg/dL (ref 8.9–10.3)
Chloride: 95 mmol/L — ABNORMAL LOW (ref 98–111)
Creatinine, Ser: 1.04 mg/dL — ABNORMAL HIGH (ref 0.44–1.00)
GFR, Estimated: 57 mL/min — ABNORMAL LOW (ref >60)
Glucose, Bld: 151 mg/dL — ABNORMAL HIGH (ref 70–99)
Potassium: 4 mmol/L (ref 3.5–5.1)
Sodium: 131 mmol/L — ABNORMAL LOW (ref 135–145)
Total Bilirubin: 1.1 mg/dL (ref 0.3–1.2)
Total Protein: 7 g/dL (ref 6.5–8.1)

## 2022-01-03 LAB — CBC WITH DIFFERENTIAL/PLATELET
Abs Immature Granulocytes: 0.21 10*3/uL — ABNORMAL HIGH (ref 0.00–0.07)
Basophils Absolute: 0.1 10*3/uL (ref 0.0–0.1)
Basophils Relative: 0 %
Eosinophils Absolute: 0 10*3/uL (ref 0.0–0.5)
Eosinophils Relative: 0 %
HCT: 43.8 % (ref 36.0–46.0)
Hemoglobin: 15.1 g/dL — ABNORMAL HIGH (ref 12.0–15.0)
Immature Granulocytes: 2 %
Lymphocytes Relative: 14 %
Lymphs Abs: 1.9 10*3/uL (ref 0.7–4.0)
MCH: 32.5 pg (ref 26.0–34.0)
MCHC: 34.5 g/dL (ref 30.0–36.0)
MCV: 94.2 fL (ref 80.0–100.0)
Monocytes Absolute: 0.7 10*3/uL (ref 0.1–1.0)
Monocytes Relative: 5 %
Neutro Abs: 10.9 10*3/uL — ABNORMAL HIGH (ref 1.7–7.7)
Neutrophils Relative %: 79 %
Platelets: 296 10*3/uL (ref 150–400)
RBC: 4.65 MIL/uL (ref 3.87–5.11)
RDW: 13.7 % (ref 11.5–15.5)
WBC: 13.9 10*3/uL — ABNORMAL HIGH (ref 4.0–10.5)
nRBC: 0 % (ref 0.0–0.2)

## 2022-01-03 LAB — BRAIN NATRIURETIC PEPTIDE: B Natriuretic Peptide: 82.6 pg/mL (ref 0.0–100.0)

## 2022-01-03 MED ORDER — DILTIAZEM HCL ER COATED BEADS 240 MG PO CP24: ORAL_CAPSULE | ORAL | 2 refills | Status: DC

## 2022-01-03 NOTE — Patient Instructions (Signed)
Medication Instructions:  Your physician has recommended you make the following change in your medication:   DECREASE Diltiazem to 240 mg daily. An Rx has been sent to [facility] pharmacy.  *If you need a refill on your cardiac medications before your next appointment, please call your pharmacy*   Lab Work: Cbc, Cmp, Bnp today  [facility]. Stop at the registration desk to check in.  If you have labs (blood work) drawn today and your tests are completely normal, you will receive your results only by: [place] (if you have MyChart) OR A paper copy in the mail If you have any lab test that is abnormal or we need to change your treatment, we will call you to review the results.   Testing/Procedures: Your physician has requested that you have an echocardiogram. Echocardiography is a painless test that uses sound waves to create images of your heart. It provides your doctor with information about the size and shape of your heart and how well your hearts chambers and valves are working. This procedure takes approximately one hour. There are no restrictions for this procedure.    Follow-Up: At [facility], you and your health needs are our priority.  As part of our continuing mission to provide you with exceptional heart care, we have created designated Provider Care Teams.  These Care Teams include your primary Cardiologist (physician) and Advanced Practice Providers (APPs -  Physician Assistants and Nurse Practitioners) who all work together to provide you with the care you need, when you need it.  We recommend signing up for the patient portal called "MyChart".  Sign up information is provided on this After Visit Summary.  MyChart is used to connect with patients for Virtual Visits (Telemedicine).  Patients are able to view lab/test results, encounter notes, upcoming appointments, etc.  Non-urgent messages can be sent to your provider as well.    To learn more about what you can do with MyChart, go to [email or web address].    Your next appointment:   3 month(s)  The format for your next appointment:   In Person  Provider:   You may see Kathlyn Sacramento, MD or one of the following Advanced Practice Providers on your designated Care Team:   Murray Hodgkins, NP Christell Faith, PA-C Cadence Kathlen Mody, PA-C{    Other Instructions N/A

## 2022-01-03 NOTE — Progress Notes (Signed)
Cardiology Office Note   Date:  01/03/2022   ID:  Boyd, Litaker [birth year], MRN [id number]  PCP:  [name], MD  Cardiologist:   Kathlyn Sacramento, MD   Chief Complaint  Patient presents with   Other    6 Month f/u c/o sob and edema left leg. Meds reviewed verbally with pt.       History of Present Illness: Laura [name] is a 71 y.o. female who is here today for a follow-up visit regarding chronic atrial fibrillation and chronic systolic heart failure.  The patient has chronic medical conditions that include obesity, sleep apnea on CPAP, asthma, chronic systolic heart failure due to nonischemic cardiomyopathy, nonobstructive CAD, CVA and essential hypertension. She was diagnosed with atrial fibrillation in October 2017.She did not tolerate multiple antiarrhythmic medications and has been treated with rate control. She was hospitalized in September of 2020 with ischemic colitis.  Angiogram showed no acute obstructive disease.  She underwent ileum and right colon resection.    She did not tolerate metoprolol due to increased dyspnea.  She underwent a right and left cardiac catheterization in March 2021 to evaluate pulmonary hypertension and exertional dyspnea.  It showed mild nonobstructive coronary artery disease.  Right heart catheterization showed moderately elevated filling pressures with mild pulmonary hypertension and normal cardiac output.  Her pulmonary wedge pressure at that time was 24 mmHg.  Furosemide was resumed at that time.  She was hospitalized in November of 2021 with acute right MCA stroke in the setting of being off anticoagulation for lumbar injection.  Echocardiogram during that admission showed an EF of 45 to 50%. She had an upper respiratory tract infection last week and has been having difficulty breathing since then.  She reports negative home COVID test.  She was prescribed prednisone by her primary care physician.  No chest pain.   Past Medical  History:  Diagnosis Date   Asthma    Chronic atrial fibrillation ([place])    a. diagnosed in 09/2016; b. failed flecainide and propafenone due to LE swelling and SOB, could not afford Multaq; c. CHADS2VASc => 5 (CHF, HTN, age x 1, nonobs CAD, female)--> Eliquis   COPD (chronic obstructive pulmonary disease) (H[facility])    GERD (gastroesophageal reflux disease)    HFmrEF (heart failure with mid-range ejection fraction) ([place])    a. 12/2019 Echo: EF 40-45%.   Hyperlipidemia    Hypertension    NICM (nonischemic cardiomyopathy) ([place])    a. 12/2019 Echo: EF 40-45%, glob HK, mildly reduced RV fxn, sev dil LA. *HR 130 (afib) during study.   Nonobstructive CAD (coronary artery disease)    a. Lexiscan Myoview 10/2016: no evidence of ischemia, EF 53%; b. 02/2020 Cath: LM nl, LAD 20p, 46m, LCX 20ost, OM1/2/3 nl, LPDA nl, LPL1/2 nl, LPAV nl, RCA small, nl.   Obesity    Obstructive sleep apnea    Pulmonary hypertension ([place])    Sleep apnea    Stroke ([place])    Systolic dysfunction    a. TTE 10/2016: EF 50%, mild LVH, moderately dilated LA, moderate MR/TR, mild pulmonary hypertension    Past Surgical History:  Procedure Laterality Date   BOWEL RESECTION  09/11/2019   Procedure: SMALL BOWEL RESECTION;  Surgeon: Herbert Pun, MD;  Location: [facility];  Service: General;;   CARDIAC CATHETERIZATION     cataract surgery     COLONOSCOPY WITH PROPOFOL N/A 03/19/2020   Procedure: COLONOSCOPY WITH PROPOFOL;  Surgeon: [name], MD;  Location:  [facility];  Service: Gastroenterology;  Laterality: N/A;   CORONARY ANGIOPLASTY     INCISION AND DRAINAGE ABSCESS Right 06/29/2016   Procedure: INCISION AND DRAINAGE ABSCESS;  Surgeon: Florene Glen, MD;  Location: [facility];  Service: General;  Laterality: Right;   INCISION AND DRAINAGE OF WOUND Left 06/29/2016   Procedure: IRRIGATION AND DEBRIDEMENT WOUND;  Surgeon: Florene Glen, MD;  Location: [facility];  Service: General;  Laterality: Left;   IR ANGIO VERTEBRAL  SEL SUBCLAVIAN INNOMINATE UNI R MOD SED  11/20/2020   IR CT HEAD LTD  11/20/2020   IR PERCUTANEOUS ART THROMBECTOMY/INFUSION INTRACRANIAL INC DIAG ANGIO  11/20/2020   LAPAROSCOPIC RIGHT COLECTOMY  09/11/2019   Procedure: RIGHT COLECTOMY;  Surgeon: Herbert Pun, MD;  Location: [facility];  Service: General;;   LAPAROSCOPY N/A 09/11/2019   Procedure: LAPAROSCOPY DIAGNOSTIC;  Surgeon: Herbert Pun, MD;  Location: [facility];  Service: General;  Laterality: N/A;   LAPAROTOMY N/A 09/13/2019   Procedure: REOPENING OF RECENT LAPAROTOMYANASTOMOSIS OF BOWEL;  Surgeon: Herbert Pun, MD;  Location: [facility];  Service: General;  Laterality: N/A;   RADIOLOGY WITH ANESTHESIA N/A 11/20/2020   Procedure: IR WITH ANESTHESIA - CODE STROKE;  Surgeon: Radiologist, Medication, MD;  Location: [place];  Service: Radiology;  Laterality: N/A;   RIGHT/LEFT HEART CATH AND CORONARY ANGIOGRAPHY Bilateral 02/27/2020   Procedure: RIGHT/LEFT HEART CATH AND CORONARY ANGIOGRAPHY;  Surgeon: Wellington Hampshire, MD;  Location: [place] [facility];  Service: Cardiovascular;  Laterality: Bilateral;   VISCERAL ANGIOGRAPHY N/A 09/12/2019   Procedure: VISCERAL ANGIOGRAPHY;  Surgeon: Algernon Huxley, MD;  Location: [place] [facility];  Service: Cardiovascular;  Laterality: N/A;     Current Outpatient Medications  Medication Sig Dispense Refill   Accu-Chek Softclix Lancets lancets Accu-Chek Softclix Lancets  USE AS INSTRUCTED TO CHECK BLOOD SUGARS DAILY 2 HOURS AFTER MEAL     albuterol (VENTOLIN HFA) 108 (90 Base) MCG/ACT inhaler Inhale 2 puffs into the lungs every 4 (four) hours as needed for wheezing or shortness of breath. 18 g 3   ALPRAZolam (XANAX) 0.25 MG tablet Take 1 tablet (0.25 mg total) by mouth 2 (two) times daily as needed for anxiety. 30 tablet 1   apixaban (ELIQUIS) 5 MG TABS tablet TAKE 1 TABLET BY MOUTH TWICE A DAY 180 tablet 1   ASPIRIN 81 PO Take by mouth.     benzonatate (TESSALON) 100 MG capsule  Take 1 capsule (100 mg total) by mouth 2 (two) times daily as needed for cough. 30 capsule 2   diltiazem (CARDIZEM CD) 360 MG 24 hr capsule TAKE 1 CAPSULE BY MOUTH EVERY DAY 30 capsule 0   EPINEPHrine 0.3 mg/0.3 mL IJ SOAJ injection Inject 0.3 mg into the muscle as needed for anaphylaxis.      fluticasone (FLONASE) 50 MCG/ACT nasal spray Place into both nostrils daily.     furosemide (LASIX) 40 MG tablet Take 1 tablet (40 mg total) by mouth daily. 90 tablet 3   gentamicin cream (GARAMYCIN) 0.1 % Apply 1 application topically 2 (two) times daily. 30 g 1   glucose blood (ACCU-CHEK GUIDE) test strip Use as instructed to check blood sugars daily 2 hours after meal DX E11.65 100 each 12   ipratropium-albuterol (DUONEB) 0.5-2.5 (3) MG/3ML SOLN Use every 6 hours and as needed 450 mL 12   loperamide (IMODIUM) 2 MG capsule Take 4 mg by mouth as needed for diarrhea or loose stools.     losartan (COZAAR) 25 MG  tablet TAKE 1 TABLET (25 MG TOTAL) BY MOUTH DAILY. 90 tablet 1   montelukast (SINGULAIR) 10 MG tablet TAKE 1 TABLET BY MOUTH EVERY DAY 90 tablet 1   omeprazole (PRILOSEC) 40 MG capsule Take 1 capsule (40 mg total) by mouth daily. 90 capsule 3   ondansetron (ZOFRAN) 4 MG tablet Take 1 tablet (4 mg total) by mouth every 8 (eight) hours as needed for nausea or vomiting. 60 tablet 1   predniSONE (DELTASONE) 10 MG tablet Take one tab 3 x day for 3 days, then take one tab 2 x a day for 3 days and then take one tab a day for 3 days for copd 18 tablet 0   Probiotic Product (PROBIOTIC-10) CAPS Take 1 capsule by mouth daily.      promethazine (PHENERGAN) 12.5 MG tablet TAKE 1 TABLET (12.5 MG TOTAL) BY MOUTH EVERY 12 (TWELVE) HOURS AS NEEDED FOR NAUSEA OR VOMITING. 30 tablet 0   rosuvastatin (CRESTOR) 20 MG tablet TAKE 1 TABLET BY MOUTH EVERY DAY 90 tablet 3   spironolactone (ALDACTONE) 25 MG tablet TAKE 1 TABLET (25 MG TOTAL) BY MOUTH DAILY. 90 tablet 0   THEO-24 100 MG 24 hr capsule TAKE 1 CAPSULE BY MOUTH EVERY  DAY 30 capsule 3   Tirzepatide (MOUNJARO [place]) Inject into the skin once a week.     traMADol (ULTRAM) 50 MG tablet Take 50 mg by mouth 4 (four) times daily as needed.     baclofen (LIORESAL) 10 MG tablet Take 10 mg by mouth 3 (three) times daily as needed. (Patient not taking: Reported on 01/03/2022)     gabapentin (NEURONTIN) 300 MG capsule Take by mouth 3 (three) times daily. (Patient not taking: Reported on 01/03/2022)     levofloxacin (LEVAQUIN) 750 MG tablet Take 1 tablet (750 mg total) by mouth daily. (Patient not taking: Reported on 01/03/2022) 7 tablet 0   No current facility-administered medications for this visit.    Allergies:   Flecainide, Metoprolol, Propafenone, and Rivaroxaban    Social History:  The patient  reports that she has never smoked. She has never used smokeless tobacco. She reports that she does not drink alcohol and does not use drugs.   Family History:  The patient's family history includes COPD in her father; Cancer in her daughter; Dementia in her mother; Heart disease in her brother; Osteoporosis in her mother; Vascular Disease in her mother.    ROS:  Please see the history of present illness.   Otherwise, review of systems are positive for none.   All other systems are reviewed and negative.    PHYSICAL EXAM: VS:  BP 100/60 (BP Location: Right Arm, Patient Position: Sitting, Cuff Size: Large)    Pulse 61    Ht 5\' 4"  (1.626 m)    Wt 246 lb 6 oz (111.8 kg)    LMP  (LMP Unknown)    SpO2 99%    BMI 42.29 kg/m  , BMI Body mass index is 42.29 kg/m. GEN: Well nourished, well developed, in no acute distress  HEENT: normal  Neck: no JVD, carotid bruits, or masses Cardiac: Irregularly irregular ; no murmurs, rubs, or gallops, +1 edema Respiratory:  clear to auscultation bilaterally, normal work of breathing GI: soft, nontender, nondistended, + BS MS: no deformity or atrophy  Skin: warm and dry, no rash Neuro:  Strength and sensation are intact Psych: euthymic mood,  full affect Radial pulses normal   EKG:  EKG is ordered today. The ekg ordered  today demonstrates atrial fibrillation with a PVC.  Low voltage with possible old anterior infarct.  Recent Labs: 03/18/2021: ALT 22 09/13/2021: BUN 17; Creatinine, Ser 0.89; Potassium 4.1; Sodium 143    Lipid Panel    Component Value Date/Time   CHOL 143 06/12/2021 1028   TRIG 140 06/12/2021 1028   HDL 73 06/12/2021 1028   CHOLHDL 2.0 06/12/2021 1028   CHOLHDL 3.3 11/21/2020 0616   VLDL 25 11/21/2020 0616   LDLCALC 47 06/12/2021 1028      Wt Readings from Last 3 Encounters:  01/03/22 246 lb 6 oz (111.8 kg)  12/24/21 248 lb (112.5 kg)  12/09/21 249 lb 6.4 oz (113.1 kg)      PAD Screen 04/03/2017  Previous PAD dx? No  Previous surgical procedure? No  Pain with walking? No  Feet/toe relief with dangling? No  Painful, non-healing ulcers? No  Extremities discolored? No      ASSESSMENT AND PLAN:  1.  Nonobstructive coronary artery disease: No chest pain.  Continue medical therapy.  2.  Chronic atrial fibrillation: Ventricular rate is somewhat on the low side with relatively low blood pressure.  I elected to decrease diltiazem extended release to 240 mg once daily.  3. Sleep apnea: Continue treatment with CPAP.  4. Essential hypertension: Blood pressure is mildly low.  Check labs and decrease diltiazem as outlined above.  5.  Chronic systolic heart failure: Mildly reduced LV systolic function.  She did not tolerate beta-blockers due to worsening dyspnea.    Continue spironolactone and losartan.  Given worsening shortness of breath, I requested an echocardiogram.  6.  History of CVA: Embolic in the setting of being off Eliquis.  Any future interruption of Eliquis will require bridging with low molecular weight heparin.  7.  Hyperlipidemia: Most recent lipid profile showed an LDL of 47.  Continue rosuvastatin 20 mg daily.    8.  Worsening dyspnea post viral illness last week.  Currently on a  prednisone taper.  I do not see significant volume overload by exam.  Will check BNP but suspect that majority of her symptoms are related to a pulmonary etiology.  Pulmonary embolism is unlikely in the setting of chronic anticoagulation with Eliquis.   Disposition:   FU in 3 months. Signed,  Kathlyn Sacramento, MD  01/03/2022 4:16 PM    [place]

## 2022-01-06 ENCOUNTER — Other Ambulatory Visit: Payer: Self-pay

## 2022-01-06 ENCOUNTER — Encounter: Payer: Self-pay | Admitting: Nurse Practitioner

## 2022-01-06 ENCOUNTER — Ambulatory Visit (INDEPENDENT_AMBULATORY_CARE_PROVIDER_SITE_OTHER): Payer: Medicare Other | Admitting: Nurse Practitioner

## 2022-01-06 ENCOUNTER — Telehealth: Payer: Self-pay

## 2022-01-06 VITALS — BP 140/76 | HR 58 | Temp 98.4°F | Resp 16 | Ht 64.0 in | Wt 246.4 lb

## 2022-01-06 DIAGNOSIS — R7303 Prediabetes: Secondary | ICD-10-CM

## 2022-01-06 DIAGNOSIS — E538 Deficiency of other specified B group vitamins: Secondary | ICD-10-CM | POA: Diagnosis not present

## 2022-01-06 DIAGNOSIS — J441 Chronic obstructive pulmonary disease with (acute) exacerbation: Secondary | ICD-10-CM

## 2022-01-06 MED ORDER — CYANOCOBALAMIN 1000 MCG/ML IJ SOLN
1000.0000 ug | Freq: Once | INTRAMUSCULAR | Status: AC
  Administered 2022-01-06: 1000 ug via INTRAMUSCULAR

## 2022-01-06 MED ORDER — PREDNISONE 10 MG PO TABS: 10.0000 mg | ORAL_TABLET | Freq: Every day | ORAL | 1 refills | Status: DC

## 2022-01-06 MED ORDER — METFORMIN HCL 500 MG PO TABS: 500.0000 mg | ORAL_TABLET | Freq: Every day | ORAL | 1 refills | Status: DC
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
## 2022-01-06 NOTE — Telephone Encounter (Signed)
-----   Message from [name], MD sent at 01/06/2022 12:01 PM EST ----- Her blood sugar is slightly elevated, please ask her if she has a Glucometer

## 2022-01-06 NOTE — Telephone Encounter (Signed)
LMOM for pt to return call to let us know if she has a glucometer to check her blood sugars per Dr Humphrey Rolls

## 2022-01-06 NOTE — Progress Notes (Signed)
Her blood sugar is slightly elevated, please ask her if she has a Glucometer

## 2022-01-06 NOTE — Progress Notes (Signed)
Appt cancelled

## 2022-01-07 ENCOUNTER — Telehealth: Payer: Self-pay

## 2022-01-07 NOTE — Telephone Encounter (Signed)
-----   Message from Laura Guise, MD sent at 01/06/2022 12:01 PM EST ----- Her blood sugar is slightly elevated, please ask her if she has a Glucometer

## 2022-01-09 DIAGNOSIS — I69354 Hemiplegia and hemiparesis following cerebral infarction affecting left non-dominant side: Secondary | ICD-10-CM | POA: Diagnosis not present

## 2022-01-09 DIAGNOSIS — M21372 Foot drop, left foot: Secondary | ICD-10-CM | POA: Diagnosis not present

## 2022-01-09 DIAGNOSIS — I428 Other cardiomyopathies: Secondary | ICD-10-CM | POA: Diagnosis not present

## 2022-01-09 DIAGNOSIS — I69398 Other sequelae of cerebral infarction: Secondary | ICD-10-CM | POA: Diagnosis not present

## 2022-01-09 DIAGNOSIS — I5020 Unspecified systolic (congestive) heart failure: Secondary | ICD-10-CM | POA: Diagnosis not present

## 2022-01-09 DIAGNOSIS — I110 Hypertensive heart disease with heart failure: Secondary | ICD-10-CM | POA: Diagnosis not present

## 2022-01-10 ENCOUNTER — Telehealth: Payer: Self-pay

## 2022-01-10 ENCOUNTER — Encounter: Payer: Self-pay | Admitting: Nurse Practitioner

## 2022-01-10 NOTE — Telephone Encounter (Signed)
Moved 01/14/22 appointment time from 345 to 300 due to 1 hour gap in provider's schedule. Left voicemail advising patient of time change to call office to confirm she received message.

## 2022-01-13 ENCOUNTER — Telehealth: Payer: Self-pay

## 2022-01-13 DIAGNOSIS — M62830 Muscle spasm of back: Secondary | ICD-10-CM | POA: Diagnosis not present

## 2022-01-13 DIAGNOSIS — S46011A Strain of muscle(s) and tendon(s) of the rotator cuff of right shoulder, initial encounter: Secondary | ICD-10-CM | POA: Diagnosis not present

## 2022-01-13 DIAGNOSIS — M21371 Foot drop, right foot: Secondary | ICD-10-CM | POA: Diagnosis not present

## 2022-01-13 DIAGNOSIS — M461 Sacroiliitis, not elsewhere classified: Secondary | ICD-10-CM | POA: Diagnosis not present

## 2022-01-13 DIAGNOSIS — M479 Spondylosis, unspecified: Secondary | ICD-10-CM | POA: Diagnosis not present

## 2022-01-13 DIAGNOSIS — M545 Low back pain, unspecified: Secondary | ICD-10-CM | POA: Diagnosis not present

## 2022-01-13 NOTE — Telephone Encounter (Signed)
Rescheduled patients appointment from 01-14-22 to 01-16-22. Patient aware.

## 2022-01-14 ENCOUNTER — Ambulatory Visit: Payer: Medicare Other | Admitting: Internal Medicine

## 2022-01-15 DIAGNOSIS — M21372 Foot drop, left foot: Secondary | ICD-10-CM | POA: Diagnosis not present

## 2022-01-15 DIAGNOSIS — I69354 Hemiplegia and hemiparesis following cerebral infarction affecting left non-dominant side: Secondary | ICD-10-CM | POA: Diagnosis not present

## 2022-01-15 DIAGNOSIS — I110 Hypertensive heart disease with heart failure: Secondary | ICD-10-CM | POA: Diagnosis not present

## 2022-01-15 DIAGNOSIS — I5020 Unspecified systolic (congestive) heart failure: Secondary | ICD-10-CM | POA: Diagnosis not present

## 2022-01-15 DIAGNOSIS — I428 Other cardiomyopathies: Secondary | ICD-10-CM | POA: Diagnosis not present

## 2022-01-15 DIAGNOSIS — I69398 Other sequelae of cerebral infarction: Secondary | ICD-10-CM | POA: Diagnosis not present

## 2022-01-16 ENCOUNTER — Ambulatory Visit: Payer: Medicare Other | Admitting: Internal Medicine

## 2022-01-20 ENCOUNTER — Other Ambulatory Visit: Payer: Self-pay | Admitting: Internal Medicine

## 2022-01-20 ENCOUNTER — Other Ambulatory Visit: Payer: Self-pay

## 2022-01-20 ENCOUNTER — Encounter: Payer: Self-pay | Admitting: Physician Assistant

## 2022-01-20 ENCOUNTER — Ambulatory Visit (INDEPENDENT_AMBULATORY_CARE_PROVIDER_SITE_OTHER): Payer: Medicare Other | Admitting: Physician Assistant

## 2022-01-20 DIAGNOSIS — J9611 Chronic respiratory failure with hypoxia: Secondary | ICD-10-CM

## 2022-01-20 DIAGNOSIS — J449 Chronic obstructive pulmonary disease, unspecified: Secondary | ICD-10-CM

## 2022-01-20 DIAGNOSIS — Z7189 Other specified counseling: Secondary | ICD-10-CM

## 2022-01-20 DIAGNOSIS — G4733 Obstructive sleep apnea (adult) (pediatric): Secondary | ICD-10-CM

## 2022-01-20 DIAGNOSIS — R0602 Shortness of breath: Secondary | ICD-10-CM
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
## 2022-01-23 ENCOUNTER — Encounter: Payer: Self-pay | Admitting: Internal Medicine

## 2022-01-24 ENCOUNTER — Telehealth: Payer: Self-pay

## 2022-01-24 ENCOUNTER — Telehealth: Payer: Self-pay | Admitting: Nurse Practitioner

## 2022-01-24 ENCOUNTER — Encounter: Payer: Self-pay | Admitting: Nurse Practitioner

## 2022-01-24 ENCOUNTER — Ambulatory Visit (INDEPENDENT_AMBULATORY_CARE_PROVIDER_SITE_OTHER): Payer: Medicare Other | Admitting: Nurse Practitioner

## 2022-01-24 ENCOUNTER — Other Ambulatory Visit: Payer: Self-pay

## 2022-01-24 VITALS — BP 140/80 | HR 60 | Temp 98.2°F | Resp 16 | Ht 64.0 in | Wt 246.2 lb

## 2022-01-24 DIAGNOSIS — S50812A Abrasion of left forearm, initial encounter: Secondary | ICD-10-CM

## 2022-01-24 DIAGNOSIS — L03114 Cellulitis of left upper limb: Secondary | ICD-10-CM

## 2022-01-24 MED ORDER — TETANUS-DIPHTH-ACELL PERTUSSIS 5-2.5-18.5 LF-MCG/0.5 IM SUSP: 0.5000 mL | Freq: Once | INTRAMUSCULAR | 0 refills | Status: AC

## 2022-01-24 MED ORDER — SULFAMETHOXAZOLE-TRIMETHOPRIM 800-160 MG PO TABS: 1.0000 | ORAL_TABLET | Freq: Two times a day (BID) | ORAL | 0 refills | Status: DC

## 2022-01-24 MED ORDER — DOXYCYCLINE HYCLATE 100 MG PO TABS: 100.0000 mg | ORAL_TABLET | Freq: Two times a day (BID) | ORAL | 0 refills | Status: DC

## 2022-01-24 NOTE — Telephone Encounter (Signed)
D/C's bactrim due to interaction with spironolactone and sent doxycycline per Yetta Flock

## 2022-01-24 NOTE — Progress Notes (Signed)
[facility] [place], [place] [address]  Internal MEDICINE  Office Visit Note  Patient Name: Laura [name]  [id number]  [id number]  Date of Service: 01/24/2022  Chief Complaint  Patient presents with   Acute Visit    Forearm wound left     HPI Daphne presents for an acute sick visit for a wound on her left forearm as of infection.  Yesterday the patient hit her left forearm on a metal piece of the door latch and it caused an abrasion and would not stop bleeding.  Patient is on blood thinners and she does have a first-aid kit at home with bleed stop.  The wound has continued to ooze slightly.  She wanted to be seen today to have the left forearm wound treated.   Current Medication:  Outpatient Encounter Medications as of 01/24/2022  Medication Sig   Accu-Chek Softclix Lancets lancets Accu-Chek Softclix Lancets  USE AS INSTRUCTED TO CHECK BLOOD SUGARS DAILY 2 HOURS AFTER MEAL   albuterol (VENTOLIN HFA) 108 (90 Base) MCG/ACT inhaler Inhale 2 puffs into the lungs every 4 (four) hours as needed for wheezing or shortness of breath.   ALPRAZolam (XANAX) 0.25 MG tablet Take 1 tablet (0.25 mg total) by mouth 2 (two) times daily as needed for anxiety.   apixaban (ELIQUIS) 5 MG TABS tablet TAKE 1 TABLET BY MOUTH TWICE A DAY   ASPIRIN 81 PO Take by mouth.   benzonatate (TESSALON) 100 MG capsule Take 1 capsule (100 mg total) by mouth 2 (two) times daily as needed for cough.   diltiazem (CARDIZEM CD) 240 MG 24 hr capsule TAKE 1 CAPSULE BY MOUTH EVERY DAY   EPINEPHrine 0.3 mg/0.3 mL IJ SOAJ injection Inject 0.3 mg into the muscle as needed for anaphylaxis.    fluticasone (FLONASE) 50 MCG/ACT nasal spray Place into both nostrils daily.   furosemide (LASIX) 40 MG tablet Take 1 tablet (40 mg total) by mouth daily.   gentamicin cream (GARAMYCIN) 0.1 % Apply 1 application topically 2 (two) times daily.   glucose blood (ACCU-CHEK GUIDE) test strip Use as instructed to check  blood sugars daily 2 hours after meal DX E11.65   ipratropium-albuterol (DUONEB) 0.5-2.5 (3) MG/3ML SOLN Use every 6 hours and as needed   loperamide (IMODIUM) 2 MG capsule Take 4 mg by mouth as needed for diarrhea or loose stools.   omeprazole (PRILOSEC) 40 MG capsule Take 1 capsule (40 mg total) by mouth daily.   ondansetron (ZOFRAN) 4 MG tablet Take 1 tablet (4 mg total) by mouth every 8 (eight) hours as needed for nausea or vomiting.   predniSONE (DELTASONE) 10 MG tablet Take 1 tablet (10 mg total) by mouth daily with breakfast.   Probiotic Product (PROBIOTIC-10) CAPS Take 1 capsule by mouth daily.    promethazine (PHENERGAN) 12.5 MG tablet TAKE 1 TABLET (12.5 MG TOTAL) BY MOUTH EVERY 12 (TWELVE) HOURS AS NEEDED FOR NAUSEA OR VOMITING.   rosuvastatin (CRESTOR) 20 MG tablet TAKE 1 TABLET BY MOUTH EVERY DAY   spironolactone (ALDACTONE) 25 MG tablet TAKE 1 TABLET (25 MG TOTAL) BY MOUTH DAILY.   [EXPIRED] Tdap (BOOSTRIX) 5-2.5-18.5 LF-MCG/0.5 injection Inject 0.5 mLs into the muscle once for 1 dose.   traMADol (ULTRAM) 50 MG tablet Take 50 mg by mouth 4 (four) times daily as needed.   [DISCONTINUED] doxycycline (VIBRA-TABS) 100 MG tablet Take 1 tablet (100 mg total) by mouth 2 (two) times daily.   [DISCONTINUED] losartan (COZAAR) 25 MG tablet  TAKE 1 TABLET (25 MG TOTAL) BY MOUTH DAILY.   [DISCONTINUED] metFORMIN (GLUCOPHAGE) 500 MG tablet Take 1 tablet (500 mg total) by mouth daily with breakfast.   [DISCONTINUED] montelukast (SINGULAIR) 10 MG tablet TAKE 1 TABLET BY MOUTH EVERY DAY   [DISCONTINUED] sulfamethoxazole-trimethoprim (BACTRIM DS) 800-160 MG tablet Take 1 tablet by mouth 2 (two) times daily for 7 days.   [DISCONTINUED] THEO-24 100 MG 24 hr capsule TAKE 1 CAPSULE BY MOUTH EVERY DAY   No facility-administered encounter medications on file as of 01/24/2022.      Medical History: Past Medical History:  Diagnosis Date   Asthma    Chronic atrial fibrillation ([place])    a. diagnosed in  09/2016; b. failed flecainide and propafenone due to LE swelling and SOB, could not afford Multaq; c. CHADS2VASc => 5 (CHF, HTN, age x 1, nonobs CAD, female)--> Eliquis   COPD (chronic obstructive pulmonary disease) (H[facility])    GERD (gastroesophageal reflux disease)    HFmrEF (heart failure with mid-range ejection fraction) ([place])    a. 12/2019 Echo: EF 40-45%.   Hyperlipidemia    Hypertension    NICM (nonischemic cardiomyopathy) ([place])    a. 12/2019 Echo: EF 40-45%, glob HK, mildly reduced RV fxn, sev dil LA. *HR 130 (afib) during study.   Nonobstructive CAD (coronary artery disease)    a. Lexiscan Myoview 10/2016: no evidence of ischemia, EF 53%; b. 02/2020 Cath: LM nl, LAD 20p, 3m LCX 20ost, OM1/2/3 nl, LPDA nl, LPL1/2 nl, LPAV nl, RCA small, nl.   Obesity    Obstructive sleep apnea    Pulmonary hypertension ([place]    Sleep apnea    [place]    Systolic dysfunction    a. TTE 10/2016: EF 50%, mild LVH, moderately dilated LA, moderate MR/TR, mild pulmonary hypertension     Vital Signs: BP 140/80 Comment: 154/72   Pulse 60    Temp 98.2 F (36.8 C)    Resp 16    Ht _0  (1.626 m)    Wt 246 lb 3.2 oz (111.7 kg)    LMP  (LMP Unknown)    SpO2 98%    BMI 42.26 kg/m    Review of Systems  Constitutional:  Negative for chills, fatigue and unexpected weight change.  HENT:  Negative for congestion, rhinorrhea, sneezing and sore throat.   Eyes:  Negative for redness.  Respiratory: Negative.  Negative for cough, chest tightness, shortness of breath and wheezing.   Cardiovascular: Negative.  Negative for chest pain and palpitations.  Gastrointestinal:  Negative for abdominal pain, constipation, diarrhea, nausea and vomiting.  Genitourinary:  Negative for dysuria and frequency.  Musculoskeletal:  Negative for arthralgias, back pain, joint swelling and neck pain.  Skin:  Positive for wound (abrasion with oozing bloody drainage). Negative for rash.  Neurological: Negative.  Negative for tremors  and numbness.  Hematological:  Negative for adenopathy. Does not bruise/bleed easily.  Psychiatric/Behavioral:  Negative for behavioral problems (Depression), sleep disturbance and suicidal ideas. The patient is not nervous/anxious.    Physical Exam Vitals reviewed.  Constitutional:      General: She is not in acute distress.    Appearance: Normal appearance. She is obese. She is not ill-appearing.  HENT:     Head: Normocephalic and atraumatic.  Eyes:     Pupils: Pupils are equal, round, and reactive to light.  Cardiovascular:     Rate and Rhythm: Normal rate and regular rhythm.  Pulmonary:     Effort: Pulmonary effort is  normal. No respiratory distress.  Skin:    Findings: Wound (abrasion of left forearm with slight erythema on the wound edges. oozing blood) present.  Neurological:     Mental Status: She is alert and oriented to person, place, and time.  Psychiatric:        Mood and Affect: Mood normal.        Behavior: Behavior normal.      Assessment/Plan: 1. Cellulitis of left forearm Doxycycline prescribed to treat possible developing cellulitis.   2. Abrasion of left forearm, initial encounter Tetanus vaccine prescribed due to nature of injury.  - Tdap ([place]) 5-2.5-18.5 LF-MCG/0.5 injection; Inject 0.5 mLs into the muscle once for 1 dose.  Dispense: 0.5 mL; Refill: 0   General Counseling: Lisanne verbalizes understanding of the findings of todays visit and agrees with plan of treatment. I have discussed any further diagnostic evaluation that may be needed or ordered today. We also reviewed her medications today. she has been encouraged to call the office with any questions or concerns that should arise related to todays visit.    Counseling:    No orders of the defined types were placed in this encounter.   Meds ordered this encounter  Medications   DISCONTD: sulfamethoxazole-trimethoprim (BACTRIM DS) 800-160 MG tablet    Sig: Take 1 tablet by mouth 2  (two) times daily for 7 days.    Dispense:  14 tablet    Refill:  0   Tdap (BOOSTRIX) 5-2.5-18.5 LF-MCG/0.5 injection    Sig: Inject 0.5 mLs into the muscle once for 1 dose.    Dispense:  0.5 mL    Refill:  0   DISCONTD: doxycycline (VIBRA-TABS) 100 MG tablet    Sig: Take 1 tablet (100 mg total) by mouth 2 (two) times daily.    Dispense:  14 tablet    Refill:  0    Return if symptoms worsen or fail to improve.  [place] Controlled Substance Database was reviewed by me for overdose risk score (ORS)  Time spent:20 Minutes Time spent with patient included reviewing progress notes, labs, imaging studies, and discussing plan for follow up.   This patient was seen by Jonetta Osgood, FNP-C in collaboration with Dr. Clayborn Bigness as a part of collaborative care agreement.  Manjit Bufano R. Valetta Fuller, MSN, FNP-C Internal Medicine

## 2022-01-29 ENCOUNTER — Ambulatory Visit (INDEPENDENT_AMBULATORY_CARE_PROVIDER_SITE_OTHER): Payer: Medicare Other

## 2022-01-29 ENCOUNTER — Other Ambulatory Visit: Payer: Self-pay

## 2022-01-29 DIAGNOSIS — I5022 Chronic systolic (congestive) heart failure: Secondary | ICD-10-CM | POA: Diagnosis not present

## 2022-01-29 LAB — ECHOCARDIOGRAM COMPLETE
AR max vel: 2.17 cm2
AV Area VTI: 2.02 cm2
AV Area mean vel: 1.9 cm2
AV Mean grad: 6 mmHg
AV Peak grad: 9.2 mmHg
AV Vena cont: 0.6 cm
Ao pk vel: 1.52 m/s
Calc EF: 47.4 %
MV M vel: 5.42 m/s
MV Peak grad: 117.5 mmHg
S' Lateral: 5.1 cm
Single Plane A2C EF: 52.4 %
Single Plane A4C EF: 41.5 %

## 2022-01-30 NOTE — Telephone Encounter (Signed)
Pt is calling requesting an appointment . She  is having a lot of upper stomach pain and can fill a knot in her stomach.

## 2022-01-31 ENCOUNTER — Telehealth: Payer: Self-pay

## 2022-01-31 ENCOUNTER — Other Ambulatory Visit: Payer: Self-pay | Admitting: Internal Medicine

## 2022-01-31 NOTE — Telephone Encounter (Signed)
Patient made aware of echo results and Dr. Tyrell Antonio recommendation. F/u appt scheduled with Dr. Fletcher Anon on 02/11/22 @ 4:40pm.

## 2022-01-31 NOTE — Telephone Encounter (Signed)
-----   Message from Wellington Hampshire, MD sent at 01/31/2022  1:32 PM EST ----- Inform patient that echo showed mild decrease in ejection fraction to 40 to 45%.  There is also moderate mitral regurgitation and a small ASD with left-to-right shunt (small hole between the right and left atrium).  Schedule a follow-up visit in few weeks to see if we can switch to Entresto and decrease diltiazem further.  We might have to rechallenge with a different beta-blocker than metoprolol which was not tolerated.

## 2022-02-03 ENCOUNTER — Ambulatory Visit: Payer: Medicare Other | Admitting: Nurse Practitioner

## 2022-02-05 ENCOUNTER — Ambulatory Visit (INDEPENDENT_AMBULATORY_CARE_PROVIDER_SITE_OTHER): Payer: Medicare Other | Admitting: Gastroenterology

## 2022-02-05 ENCOUNTER — Encounter: Payer: Self-pay | Admitting: Gastroenterology

## 2022-02-05 ENCOUNTER — Other Ambulatory Visit: Payer: Self-pay

## 2022-02-05 VITALS — BP 128/65 | HR 50 | Temp 98.2°F | Wt 248.0 lb

## 2022-02-05 DIAGNOSIS — R197 Diarrhea, unspecified: Secondary | ICD-10-CM

## 2022-02-05 NOTE — Patient Instructions (Signed)
High-Fiber Eating Plan °Fiber, also called dietary fiber, is a type of carbohydrate. It is found foods such as fruits, vegetables, whole grains, and beans. A high-fiber diet can have many health benefits. Your health care provider may recommend a high-fiber diet to help: °Prevent constipation. Fiber can make your bowel movements more regular. °Lower your cholesterol. °Relieve the following conditions: °Inflammation of veins in the anus (hemorrhoids). °Inflammation of specific areas of the digestive tract (uncomplicated diverticulosis). °A problem of the large intestine, also called the colon, that sometimes causes pain and diarrhea (irritable bowel syndrome, or IBS). °Prevent overeating as part of a weight-loss plan. °Prevent heart disease, type 2 diabetes, and certain cancers. °What are tips for following this plan? °Reading food labels ° °Check the nutrition facts label on food products for the amount of dietary fiber. Choose foods that have 5 grams of fiber or more per serving. °The goals for recommended daily fiber intake include: °Men (age 50 or younger): 34-38 g. °Men (over age 50): 28-34 g. °Women (age 50 or younger): 25-28 g. °Women (over age 50): 22-25 g. °Your daily fiber goal is _____________ g. °Shopping °Choose whole fruits and vegetables instead of processed forms, such as apple juice or applesauce. °Choose a wide variety of high-fiber foods such as avocados, lentils, oats, and kidney beans. °Read the nutrition facts label of the foods you choose. Be aware of foods with added fiber. These foods often have high sugar and sodium amounts per serving. °Cooking °Use whole-grain flour for baking and cooking. °Cook with brown rice instead of white rice. °Meal planning °Start the day with a breakfast that is high in fiber, such as a cereal that contains 5 g of fiber or more per serving. °Eat breads and cereals that are made with whole-grain flour instead of refined flour or white flour. °Eat brown rice, bulgur  wheat, or millet instead of white rice. °Use beans in place of meat in soups, salads, and pasta dishes. °Be sure that half of the grains you eat each day are whole grains. °General information °You can get the recommended daily intake of dietary fiber by: °Eating a variety of fruits, vegetables, grains, nuts, and beans. °Taking a fiber supplement if you are not able to take in enough fiber in your diet. It is better to get fiber through food than from a supplement. °Gradually increase how much fiber you consume. If you increase your intake of dietary fiber too quickly, you may have bloating, cramping, or gas. °Drink plenty of water to help you digest fiber. °Choose high-fiber snacks, such as berries, raw vegetables, nuts, and popcorn. °What foods should I eat? °Fruits °Berries. Pears. Apples. Oranges. Avocado. Prunes and raisins. Dried figs. °Vegetables °Sweet potatoes. Spinach. Kale. Artichokes. Cabbage. Broccoli. Cauliflower. Green peas. Carrots. Squash. °Grains °Whole-grain breads. Multigrain cereal. Oats and oatmeal. Brown rice. Barley. Bulgur wheat. Millet. Quinoa. Bran muffins. Popcorn. Rye wafer crackers. °Meats and other proteins °Navy beans, kidney beans, and pinto beans. Soybeans. Split peas. Lentils. Nuts and seeds. °Dairy °Fiber-fortified yogurt. °Beverages °Fiber-fortified soy milk. Fiber-fortified orange juice. °Other foods °Fiber bars. °The items listed above may not be a complete list of recommended foods and beverages. Contact a dietitian for more information. °What foods should I avoid? °Fruits °Fruit juice. Cooked, strained fruit. °Vegetables °Fried potatoes. Canned vegetables. Well-cooked vegetables. °Grains °White bread. Pasta made with refined flour. White rice. °Meats and other proteins °Fatty cuts of meat. Fried chicken or fried fish. °Dairy °Milk. Yogurt. Cream cheese. Sour cream. °Fats and   oils °Butters. °Beverages °Soft drinks. °Other foods °Cakes and pastries. °The items listed above may  not be a complete list of foods and beverages to avoid. Talk with your dietitian about what choices are best for you. °Summary °Fiber is a type of carbohydrate. It is found in foods such as fruits, vegetables, whole grains, and beans. °A high-fiber diet has many benefits. It can help to prevent constipation, lower blood cholesterol, aid weight loss, and reduce your risk of heart disease, diabetes, and certain cancers. °Increase your intake of fiber gradually. Increasing fiber too quickly may cause cramping, bloating, and gas. Drink plenty of water while you increase the amount of fiber you consume. °The best sources of fiber include whole fruits and vegetables, whole grains, nuts, seeds, and beans. °This information is not intended to replace advice given to you by your health care provider. Make sure you discuss any questions you have with your health care provider. °Document Revised: 04/12/2020 Document Reviewed: 04/12/2020 °Elsevier Patient Education © 2022 [employer] ° °

## 2022-02-05 NOTE — Progress Notes (Signed)
[name] MD, MRCP(U.K) [address]  [place]  [place], [place] [address]  Main: [phone]  Fax: [phone]   Primary Care Physician: [name], MD  Primary Gastroenterologist:  Dr. [name]   Chief Complaint  Patient presents with   Abdominal Pain    HPI: Laura [name] is a 71 y.o. female  Summary of history :   She was initially referred and seen on 01/25/2020 for diarrhea.She has previously been a patient of [facility] gastroenterology. She carries a diagnosis of collagenous colitis noted in 2017. Looking back at colon biopsy results from 2013 the biopsy results are not very specific for collagenous colitis. She was hospitalized in November 2020 and underwent resection of the small bowel due to ischemia.  During the surgery she underwent ileum and right colon resection.  Had a second look surgery.She says that prior to the surgery she was having features of constipation alternating with diarrhea.  Since the surgery she has been having only diarrhea up to 10-15 times a day.  At times she has accidents and she has to wear a diaper to prevent soiling her undergarments.  She denies any use of any artificial sugars, NSAIDs, Metformin.  She wishes to transfer care to our care she was on a PPI which has been stopped. Failed treatment with Questran, imodium and CREON   09/18/2019: CT scan of the abdomen shows status post distal small bowel resection with right hemicolectomy and enterocolonic anastomosis.   01/02/2020: Ferritin 25.  B12 and folate normal.  Hemoglobin 10.7 g in January 2021 with an MCV of 85.   01/27/2020: Fecal calprotectin normal, celiac serology negative.  Stool for C. difficile toxin was negative.  GI PCR of her stool showed enteroaggregative E. coli positive.  Commenced on azithromycin.   03/04/2020: Stool PCR- negative    03/19/2020: Biopsies taken of the terminal ileum as well as colon.  No evidence of any form of colitis or inflammation.   In November 2021  had a acute right MCA ischemic stroke likely embolic due to atrial fibrillation.  Occurred when she went off her anticoagulation for lumbar injection      Interval history  01/24/2021-02/05/2022  Since her last visit about a year back she has been doing well.  Diarrhea is of constipation but stable on a combination of fiber and as needed MiraLAX.  Recently she states that she fell and hurt her left arm and had a cut on the which required antibiotics and NSAIDs following which she started having severe profuse diarrhea which has since improved.  The diarrhea has improved after stopping the antibiotics and NSAIDs.  She still has a lot of bloating gas and mushy stools she is concerned about.  Multiple bowel movements a day.     Current Outpatient Medications  Medication Sig Dispense Refill   Accu-Chek Softclix Lancets lancets Accu-Chek Softclix Lancets  USE AS INSTRUCTED TO CHECK BLOOD SUGARS DAILY 2 HOURS AFTER MEAL     albuterol (VENTOLIN HFA) 108 (90 Base) MCG/ACT inhaler Inhale 2 puffs into the lungs every 4 (four) hours as needed for wheezing or shortness of breath. 18 g 3   ALPRAZolam (XANAX) 0.25 MG tablet Take 1 tablet (0.25 mg total) by mouth 2 (two) times daily as needed for anxiety. 30 tablet 1   apixaban (ELIQUIS) 5 MG TABS tablet TAKE 1 TABLET BY MOUTH TWICE A DAY 180 tablet 1   ASPIRIN 81 PO Take by mouth.  benzonatate (TESSALON) 100 MG capsule Take 1 capsule (100 mg total) by mouth 2 (two) times daily as needed for cough. 30 capsule 2   diltiazem (CARDIZEM CD) 240 MG 24 hr capsule TAKE 1 CAPSULE BY MOUTH EVERY DAY 90 capsule 2   doxycycline (VIBRAMYCIN) 100 MG capsule Take 1 capsule by mouth in the morning and at bedtime.     EPINEPHrine 0.3 mg/0.3 mL IJ SOAJ injection Inject 0.3 mg into the muscle as needed for anaphylaxis.      fluticasone (FLONASE) 50 MCG/ACT nasal spray Place into both nostrils daily.     furosemide (LASIX) 40 MG tablet Take 1 tablet (40 mg total) by mouth  daily. 90 tablet 3   gentamicin cream (GARAMYCIN) 0.1 % Apply 1 application topically 2 (two) times daily. 30 g 1   glucose blood (ACCU-CHEK GUIDE) test strip Use as instructed to check blood sugars daily 2 hours after meal DX E11.65 100 each 12   ipratropium-albuterol (DUONEB) 0.5-2.5 (3) MG/3ML SOLN Use every 6 hours and as needed 450 mL 12   loperamide (IMODIUM) 2 MG capsule Take 4 mg by mouth as needed for diarrhea or loose stools.     losartan (COZAAR) 25 MG tablet TAKE 1 TABLET (25 MG TOTAL) BY MOUTH DAILY. 90 tablet 1   methocarbamol (ROBAXIN) 500 MG tablet Take 1 tablet by mouth 3 (three) times daily as needed.     montelukast (SINGULAIR) 10 MG tablet TAKE 1 TABLET BY MOUTH EVERY DAY 90 tablet 1   omeprazole (PRILOSEC) 40 MG capsule Take 1 capsule (40 mg total) by mouth daily. 90 capsule 3   ondansetron (ZOFRAN) 4 MG tablet Take 1 tablet (4 mg total) by mouth every 8 (eight) hours as needed for nausea or vomiting. 60 tablet 1   predniSONE (DELTASONE) 10 MG tablet Take 1 tablet (10 mg total) by mouth daily with breakfast. 90 tablet 1   Probiotic Product (PROBIOTIC-10) CAPS Take 1 capsule by mouth daily.      promethazine (PHENERGAN) 12.5 MG tablet TAKE 1 TABLET (12.5 MG TOTAL) BY MOUTH EVERY 12 (TWELVE) HOURS AS NEEDED FOR NAUSEA OR VOMITING. 30 tablet 0   rosuvastatin (CRESTOR) 20 MG tablet TAKE 1 TABLET BY MOUTH EVERY DAY 90 tablet 3   THEO-24 100 MG 24 hr capsule TAKE 1 CAPSULE BY MOUTH EVERY DAY 30 capsule 3   traMADol (ULTRAM) 50 MG tablet Take 50 mg by mouth 4 (four) times daily as needed.     spironolactone (ALDACTONE) 25 MG tablet TAKE 1 TABLET (25 MG TOTAL) BY MOUTH DAILY. 90 tablet 0   No current facility-administered medications for this visit.    Allergies as of 02/05/2022 - Review Complete 02/05/2022  Allergen Reaction Noted   Flecainide Shortness Of Breath 11/27/2016   Metoprolol Shortness Of Breath, Swelling, and Other (See Comments) 01/13/2020   Propafenone Shortness  Of Breath, Swelling, and Other (See Comments) 12/01/2016   Rivaroxaban Swelling and Other (See Comments) 10/23/2016    ROS:  General: Negative for anorexia, weight loss, fever, chills, fatigue, weakness. ENT: Negative for hoarseness, difficulty swallowing , nasal congestion. CV: Negative for chest pain, angina, palpitations, dyspnea on exertion, peripheral edema.  Respiratory: Negative for dyspnea at rest, dyspnea on exertion, cough, sputum, wheezing.  GI: See history of present illness. GU:  Negative for dysuria, hematuria, urinary incontinence, urinary frequency, nocturnal urination.  Endo: Negative for unusual weight change.    Physical Examination:   BP 128/65    Pulse (!) 50  Temp 98.2 F (36.8 C) (Oral)    Wt 248 lb (112.5 kg)    LMP  (LMP Unknown)    BMI 42.57 kg/m   General: Well-nourished, well-developed in no acute distress.  Eyes: No icterus. Conjunctivae pink. Abdomen: Bowel sounds are normal, nontender, nondistended, no hepatosplenomegaly or masses, no abdominal bruits or hernia , no rebound or guarding.   Extremities: No lower extremity edema. No clubbing or deformities. Neuro: Alert and oriented x 3.  Grossly intact. Skin: Warm and dry, no jaundice.   Psych: Alert and cooperative, normal mood and affect.   Imaging Studies: ECHOCARDIOGRAM COMPLETE  Result Date: 01/29/2022    ECHOCARDIOGRAM REPORT   Patient Name:   Laura [name] Date of Exam: 01/29/2022 Medical Rec #:  [id number]        Height:       64.0 in Accession #:    [id number]       Weight:       246.2 lb Date of Birth:  May 18, 1951         BSA:          2.137 m Patient Age:    60 years         BP:           142/72 mmHg Patient Gender: F                HR:           100 bpm. Exam Location:  [facility] Procedure: 2D Echo, Cardiac Doppler and Color Doppler Indications:    H37.16 Chronic systolic (congestive) heart failure  History:        Patient has prior history of Echocardiogram examinations, most                  recent 11/21/2020. Cardiomyopathy, PAD and COPD,                 Arrythmias:Atrial Fibrillation, Signs/Symptoms:Shortness of                 Breath; Risk Factors:Hypertension, Non-Smoker and Sleep Apnea.  Sonographer:    Wilkie Aye RVT Sonographer#2:  Caesar Chestnut Referring Phys: [place]  1. Left ventricular ejection fraction, by estimation, is 40 to 45%. The left ventricle has mild to moderately decreased function. The left ventricle demonstrates global hypokinesis. The left ventricular internal cavity size was severely dilated. Left ventricular diastolic parameters are indeterminate.  2. Right ventricular systolic function is normal. The right ventricular size is normal.  3. Left atrial size was mild to moderately dilated.  4. Right atrial size was mildly dilated.  5. The mitral valve is grossly normal. Moderate mitral valve regurgitation.  6. The aortic valve is tricuspid. Aortic valve regurgitation is mild. Aortic valve sclerosis is present, with no evidence of aortic valve stenosis.  7. The inferior vena cava is dilated in size with >50% respiratory variability, suggesting right atrial pressure of 8 mmHg.  8. Evidence of atrial level shunting detected by color flow Doppler. Comparison(s): LVEF 45-50%. FINDINGS  Left Ventricle: Left ventricular ejection fraction, by estimation, is 40 to 45%. The left ventricle has mild to moderately decreased function. The left ventricle demonstrates global hypokinesis. The left ventricular internal cavity size was severely dilated. There is no left ventricular hypertrophy. Left ventricular diastolic parameters are indeterminate. Right Ventricle: The right ventricular size is normal. No increase in right ventricular wall thickness. Right ventricular systolic function is normal. Left Atrium: Left atrial size was  mild to moderately dilated. Right Atrium: Right atrial size was mildly dilated. Pericardium: There is no evidence of pericardial effusion.  Mitral Valve: The mitral valve is grossly normal. Mild mitral annular calcification. Moderate mitral valve regurgitation. Tricuspid Valve: The tricuspid valve is normal in structure. Tricuspid valve regurgitation is not demonstrated. Aortic Valve: The aortic valve is tricuspid. Aortic valve regurgitation is mild. Aortic valve sclerosis is present, with no evidence of aortic valve stenosis. Aortic valve mean gradient measures 6.0 mmHg. Aortic valve peak gradient measures 9.2 mmHg. Aortic valve area, by VTI measures 2.02 cm. Pulmonic Valve: The pulmonic valve was not well visualized. Pulmonic valve regurgitation is not visualized. Aorta: The aortic root and ascending aorta are structurally normal, with no evidence of dilitation. Venous: The inferior vena cava is dilated in size with greater than 50% respiratory variability, suggesting right atrial pressure of 8 mmHg. IAS/Shunts: Evidence of atrial level shunting detected by color flow Doppler.  LEFT VENTRICLE PLAX 2D LVIDd:         6.90 cm LVIDs:         5.10 cm LV PW:         0.80 cm LV IVS:        1.00 cm LVOT diam:     2.00 cm LV SV:         58 LV SV Index:   27 LVOT Area:     3.14 cm  LV Volumes (MOD) LV vol d, MOD A2C: 104.0 ml LV vol d, MOD A4C: 114.0 ml LV vol s, MOD A2C: 49.5 ml LV vol s, MOD A4C: 66.7 ml LV SV MOD A2C:     54.5 ml LV SV MOD A4C:     114.0 ml LV SV MOD BP:      51.8 ml RIGHT VENTRICLE          IVC RV Basal diam:  3.50 cm  IVC diam: 2.10 cm RV Mid diam:    2.50 cm TAPSE (M-mode): 2.4 cm LEFT ATRIUM             Index        RIGHT ATRIUM           Index LA diam:        5.40 cm 2.53 cm/m   RA Area:     23.10 cm LA Vol (A2C):   86.2 ml 40.34 ml/m  RA Volume:   73.50 ml  34.39 ml/m LA Vol (A4C):   78.8 ml 36.87 ml/m LA Biplane Vol: 84.8 ml 39.68 ml/m  AORTIC VALVE                     PULMONIC VALVE AV Area (Vmax):    2.17 cm      PV Vmax:          1.11 m/s AV Area (Vmean):   1.90 cm      PV Peak grad:     4.9 mmHg AV Area (VTI):     2.02  cm      PR End Diast Vel: 5.11 msec AV Vmax:           152.00 cm/s AV Vmean:          117.000 cm/s AV VTI:            0.289 m AV Peak Grad:      9.2 mmHg AV Mean Grad:      6.0 mmHg LVOT Vmax:  105.00 cm/s LVOT Vmean:        70.700 cm/s LVOT VTI:          0.186 m LVOT/AV VTI ratio: 0.64 AR Vena Contracta: 0.60 cm  AORTA Ao Root diam: 3.40 cm Ao Asc diam:  3.50 cm MR Peak grad: 117.5 mmHg  TRICUSPID VALVE MR Mean grad: 78.7 mmHg   TR Peak grad:   34.8 mmHg MR Vmax:      542.00 cm/s TR Vmax:        295.00 cm/s MR Vmean:     412.7 cm/s                           SHUNTS                           Systemic VTI:  0.19 m                           Systemic Diam: 2.00 cm Kate Sable MD Electronically signed by Kate Sable MD Signature Date/Time: 01/29/2022/7:21:18 PM    Final     Assessment and Plan:   Laura [name] is a 71 y.o. y/o female here to see me for diarrhea since a course of antibiotic and NSAIDs for skin infection after cut from a fall.  Concern for antibiotic induced diarrhea versus C. difficile colitis.  Plan High-fiber diet 2.  Stool studies will be ordered     Dr [name]  MD,MRCP [facility]) Follow up in 10 to 12 days video visit acute diarrhea

## 2022-02-06 ENCOUNTER — Encounter: Payer: Self-pay | Admitting: Nurse Practitioner

## 2022-02-11 ENCOUNTER — Other Ambulatory Visit: Payer: Self-pay

## 2022-02-11 ENCOUNTER — Ambulatory Visit (INDEPENDENT_AMBULATORY_CARE_PROVIDER_SITE_OTHER): Payer: Medicare Other | Admitting: Cardiovascular Disease

## 2022-02-11 ENCOUNTER — Encounter: Payer: Self-pay | Admitting: Cardiovascular Disease

## 2022-02-11 VITALS — BP 120/60 | HR 74 | Ht 64.0 in | Wt 246.0 lb

## 2022-02-11 DIAGNOSIS — I2510 Atherosclerotic heart disease of native coronary artery without angina pectoris: Secondary | ICD-10-CM

## 2022-02-11 DIAGNOSIS — Z8673 Personal history of transient ischemic attack (TIA), and cerebral infarction without residual deficits: Secondary | ICD-10-CM

## 2022-02-11 DIAGNOSIS — G4733 Obstructive sleep apnea (adult) (pediatric): Secondary | ICD-10-CM

## 2022-02-11 DIAGNOSIS — I482 Chronic atrial fibrillation, unspecified: Secondary | ICD-10-CM

## 2022-02-11 DIAGNOSIS — I10 Essential (primary) hypertension: Secondary | ICD-10-CM

## 2022-02-11 DIAGNOSIS — Z9989 Dependence on other enabling machines and devices: Secondary | ICD-10-CM | POA: Diagnosis not present

## 2022-02-11 DIAGNOSIS — I5022 Chronic systolic (congestive) heart failure: Secondary | ICD-10-CM | POA: Diagnosis not present

## 2022-02-11 DIAGNOSIS — R197 Diarrhea, unspecified: Secondary | ICD-10-CM | POA: Diagnosis not present

## 2022-02-11 NOTE — Patient Instructions (Signed)

## 2022-02-11 NOTE — Progress Notes (Signed)
Cardiology Office Note   Date:  02/11/2022   ID:  Dhanya, Bogle 05-12-1951, MRN [id number]  PCP:  [name], MD  Cardiologist:   Kathlyn Sacramento, MD   Chief Complaint  Patient presents with   Other    F/u echo results c/o sob and edema left arm/ankle Meds reviewed verbally with pt.       History of Present Illness: Laura [name] is a 71 y.o. female who is here today for a follow-up visit regarding chronic atrial fibrillation and chronic systolic heart failure.  The patient has chronic medical conditions that include obesity, sleep apnea on CPAP, asthma, chronic systolic heart failure due to nonischemic cardiomyopathy, nonobstructive CAD, CVA and essential hypertension. She was diagnosed with atrial fibrillation in October 2017.She did not tolerate multiple antiarrhythmic medications and has been treated with rate control. She was hospitalized in September of 2020 with ischemic colitis.  Angiogram showed no acute obstructive disease.  She underwent ileum and right colon resection.    She did not tolerate metoprolol due to increased dyspnea.  She underwent a right and left cardiac catheterization in March 2021 to evaluate pulmonary hypertension and exertional dyspnea.  It showed mild nonobstructive coronary artery disease.  Right heart catheterization showed moderately elevated filling pressures with mild pulmonary hypertension and normal cardiac output.  Her pulmonary wedge pressure at that time was 24 mmHg.  Furosemide was resumed at that time.  She was hospitalized in November of 2021 with acute right MCA stroke in the setting of being off anticoagulation for lumbar injection.  Echocardiogram during that admission showed an EF of 45 to 50%. She had worsening shortness of breath recently and thus I repeated her echocardiogram which showed an EF of 40 to 45% with moderate mitral regurgitation and small ASD with left-to-right shunt.  Right ventricle was normal in size with  normal function.  Shortness of breath is still the same.  She continues to have mild bilateral leg edema.  She takes furosemide 40 mg daily.   Past Medical History:  Diagnosis Date   Asthma    Chronic atrial fibrillation ([place])    a. diagnosed in 09/2016; b. failed flecainide and propafenone due to LE swelling and SOB, could not afford Multaq; c. CHADS2VASc => 5 (CHF, HTN, age x 1, nonobs CAD, female)--> Eliquis   COPD (chronic obstructive pulmonary disease) ([facility])    GERD (gastroesophageal reflux disease)    HFmrEF (heart failure with mid-range ejection fraction) ([place])    a. 12/2019 Echo: EF 40-45%.   Hyperlipidemia    Hypertension    NICM (nonischemic cardiomyopathy) ([place])    a. 12/2019 Echo: EF 40-45%, glob HK, mildly reduced RV fxn, sev dil LA. *HR 130 (afib) during study.   Nonobstructive CAD (coronary artery disease)    a. Lexiscan Myoview 10/2016: no evidence of ischemia, EF 53%; b. 02/2020 Cath: LM nl, LAD 20p, 59m, LCX 20ost, OM1/2/3 nl, LPDA nl, LPL1/2 nl, LPAV nl, RCA small, nl.   Obesity    Obstructive sleep apnea    Pulmonary hypertension ([place])    Sleep apnea    Stroke ([place])    Systolic dysfunction    a. TTE 10/2016: EF 50%, mild LVH, moderately dilated LA, moderate MR/TR, mild pulmonary hypertension    Past Surgical History:  Procedure Laterality Date   BOWEL RESECTION  09/11/2019   Procedure: SMALL BOWEL RESECTION;  Surgeon: Herbert Pun, MD;  Location: [facility] ORS;  Service: General;;   CARDIAC CATHETERIZATION  cataract surgery     COLONOSCOPY WITH PROPOFOL N/A 03/19/2020   Procedure: COLONOSCOPY WITH PROPOFOL;  Surgeon: [name], MD;  Location: [facility];  Service: Gastroenterology;  Laterality: N/A;   CORONARY ANGIOPLASTY     INCISION AND DRAINAGE ABSCESS Right 06/29/2016   Procedure: INCISION AND DRAINAGE ABSCESS;  Surgeon: Florene Glen, MD;  Location: [facility] ORS;  Service: General;  Laterality: Right;   INCISION AND DRAINAGE OF WOUND Left 06/29/2016    Procedure: IRRIGATION AND DEBRIDEMENT WOUND;  Surgeon: Florene Glen, MD;  Location: [facility] ORS;  Service: General;  Laterality: Left;   IR ANGIO VERTEBRAL SEL SUBCLAVIAN INNOMINATE UNI R MOD SED  11/20/2020   IR CT HEAD LTD  11/20/2020   IR PERCUTANEOUS ART THROMBECTOMY/INFUSION INTRACRANIAL INC DIAG ANGIO  11/20/2020   LAPAROSCOPIC RIGHT COLECTOMY  09/11/2019   Procedure: RIGHT COLECTOMY;  Surgeon: Herbert Pun, MD;  Location: [facility] OR[facility];  Service: General;;   LAPAROSCOPY N/A 09/11/2019   Procedure: LAPAROSCOPY DIAGNOSTIC;  Surgeon: Herbert Pun, MD;  Location: [facility];  Service: General;  Laterality: N/A;   LAPAROTOMY N/A 09/13/2019   Procedure: REOPENING OF RECENT LAPAROTOMYANASTOMOSIS OF BOWEL;  Surgeon: Herbert Pun, MD;  Location: [facility];  Service: General;  Laterality: N/A;   RADIOLOGY WITH ANESTHESIA N/A 11/20/2020   Procedure: IR WITH ANESTHESIA - CODE STROKE;  Surgeon: Radiologist, Medication, MD;  Location: [facility];  Service: Radiology;  Laterality: N/A;   RIGHT/LEFT HEART CATH AND CORONARY ANGIOGRAPHY Bilateral 02/27/2020   Procedure: RIGHT/LEFT HEART CATH AND CORONARY ANGIOGRAPHY;  Surgeon: Wellington Hampshire, MD;  Location: [place] [facility];  Service: Cardiovascular;  Laterality: Bilateral;   VISCERAL ANGIOGRAPHY N/A 09/12/2019   Procedure: VISCERAL ANGIOGRAPHY;  Surgeon: Algernon Huxley, MD;  Location: [place] [facility];  Service: Cardiovascular;  Laterality: N/A;     Current Outpatient Medications  Medication Sig Dispense Refill   Accu-Chek Softclix Lancets lancets Accu-Chek Softclix Lancets  USE AS INSTRUCTED TO CHECK BLOOD SUGARS DAILY 2 HOURS AFTER MEAL     albuterol (VENTOLIN HFA) 108 (90 Base) MCG/ACT inhaler Inhale 2 puffs into the lungs every 4 (four) hours as needed for wheezing or shortness of breath. 18 g 3   ALPRAZolam (XANAX) 0.25 MG tablet Take 1 tablet (0.25 mg total) by mouth 2 (two) times daily as needed for anxiety. 30 tablet 1    apixaban (ELIQUIS) 5 MG TABS tablet TAKE 1 TABLET BY MOUTH TWICE A DAY 180 tablet 1   ASPIRIN 81 PO Take by mouth.     benzonatate (TESSALON) 100 MG capsule Take 1 capsule (100 mg total) by mouth 2 (two) times daily as needed for cough. 30 capsule 2   diltiazem (CARDIZEM CD) 240 MG 24 hr capsule TAKE 1 CAPSULE BY MOUTH EVERY DAY 90 capsule 2   EPINEPHrine 0.3 mg/0.3 mL IJ SOAJ injection Inject 0.3 mg into the muscle as needed for anaphylaxis.      fluticasone (FLONASE) 50 MCG/ACT nasal spray Place into both nostrils daily.     furosemide (LASIX) 40 MG tablet Take 1 tablet (40 mg total) by mouth daily. 90 tablet 3   gentamicin cream (GARAMYCIN) 0.1 % Apply 1 application topically 2 (two) times daily. 30 g 1   glucose blood (ACCU-CHEK GUIDE) test strip Use as instructed to check blood sugars daily 2 hours after meal DX E11.65 100 each 12   ipratropium-albuterol (DUONEB) 0.5-2.5 (3) MG/3ML SOLN Use every 6 hours and as needed 450 mL 12   loperamide (  IMODIUM) 2 MG capsule Take 4 mg by mouth as needed for diarrhea or loose stools.     losartan (COZAAR) 25 MG tablet TAKE 1 TABLET (25 MG TOTAL) BY MOUTH DAILY. 90 tablet 1   montelukast (SINGULAIR) 10 MG tablet TAKE 1 TABLET BY MOUTH EVERY DAY 90 tablet 1   omeprazole (PRILOSEC) 40 MG capsule Take 1 capsule (40 mg total) by mouth daily. 90 capsule 3   ondansetron (ZOFRAN) 4 MG tablet Take 1 tablet (4 mg total) by mouth every 8 (eight) hours as needed for nausea or vomiting. 60 tablet 1   predniSONE (DELTASONE) 10 MG tablet Take 1 tablet (10 mg total) by mouth daily with breakfast. 90 tablet 1   Probiotic Product (PROBIOTIC-10) CAPS Take 1 capsule by mouth daily.      promethazine (PHENERGAN) 12.5 MG tablet TAKE 1 TABLET (12.5 MG TOTAL) BY MOUTH EVERY 12 (TWELVE) HOURS AS NEEDED FOR NAUSEA OR VOMITING. 30 tablet 0   rosuvastatin (CRESTOR) 20 MG tablet TAKE 1 TABLET BY MOUTH EVERY DAY 90 tablet 3   spironolactone (ALDACTONE) 25 MG tablet TAKE 1 TABLET (25  MG TOTAL) BY MOUTH DAILY. 90 tablet 0   THEO-24 100 MG 24 hr capsule TAKE 1 CAPSULE BY MOUTH EVERY DAY 30 capsule 3   traMADol (ULTRAM) 50 MG tablet Take 50 mg by mouth 4 (four) times daily as needed.     doxycycline (VIBRAMYCIN) 100 MG capsule Take 1 capsule by mouth in the morning and at bedtime. (Patient not taking: Reported on 02/11/2022)     methocarbamol (ROBAXIN) 500 MG tablet Take 1 tablet by mouth 3 (three) times daily as needed. (Patient not taking: Reported on 02/11/2022)     No current facility-administered medications for this visit.    Allergies:   Flecainide, Metoprolol, Propafenone, and Rivaroxaban    Social History:  The patient  reports that she has never smoked. She has never used smokeless tobacco. She reports that she does not drink alcohol and does not use drugs.   Family History:  The patient's family history includes COPD in her father; Cancer in her daughter; Dementia in her mother; Heart disease in her brother; Osteoporosis in her mother; Vascular Disease in her mother.    ROS:  Please see the history of present illness.   Otherwise, review of systems are positive for none.   All other systems are reviewed and negative.    PHYSICAL EXAM: VS:  BP 120/60 (BP Location: Left Arm, Patient Position: Sitting, Cuff Size: Large)    Pulse 74    Ht 5\' 4"  (1.626 m)    Wt 246 lb (111.6 kg)    LMP  (LMP Unknown)    SpO2 98%    BMI 42.23 kg/m  , BMI Body mass index is 42.23 kg/m. GEN: Well nourished, well developed, in no acute distress  HEENT: normal  Neck: no JVD, carotid bruits, or masses Cardiac: Irregularly irregular ; no murmurs, rubs, or gallops, +1 edema Respiratory:  clear to auscultation bilaterally, normal work of breathing GI: soft, nontender, nondistended, + BS MS: no deformity or atrophy  Skin: warm and dry, no rash Neuro:  Strength and sensation are intact Psych: euthymic mood, full affect Radial pulses normal   EKG:  EKG is ordered today. The ekg ordered  today demonstrates atrial fibrillation with ventricular rate of 74 bpm.   Recent Labs: 01/03/2022: ALT 23; B Natriuretic Peptide 82.6; BUN 19; Creatinine, Ser 1.04; Hemoglobin 15.1; Platelets 296; Potassium 4.0; Sodium 131  Lipid Panel    Component Value Date/Time   CHOL 143 06/12/2021 1028   TRIG 140 06/12/2021 1028   HDL 73 06/12/2021 1028   CHOLHDL 2.0 06/12/2021 1028   CHOLHDL 3.3 11/21/2020 0616   VLDL 25 11/21/2020 0616   LDLCALC 47 06/12/2021 1028      Wt Readings from Last 3 Encounters:  02/11/22 246 lb (111.6 kg)  02/05/22 248 lb (112.5 kg)  01/24/22 246 lb 3.2 oz (111.7 kg)      PAD Screen 04/03/2017  Previous PAD dx? No  Previous surgical procedure? No  Pain with walking? No  Feet/toe relief with dangling? No  Painful, non-healing ulcers? No  Extremities discolored? No      ASSESSMENT AND PLAN:  1.  Nonobstructive coronary artery disease: No chest pain.  Continue medical therapy.  2.  Chronic atrial fibrillation: Ventricular rate was low during last visit and thus I decreased the dose of diltiazem.  She is on anticoagulation with Eliquis.  3. Sleep apnea: Continue treatment with CPAP.  4. Essential hypertension: Blood pressure is in the normal range.  5.  Chronic systolic heart failure: I reviewed the results of recent echocardiogram with her.  EF is slightly decreased compared to before but still in the mild range at 40 to 45%.  I discussed with her the option of switching losartan to [facility] but she is concerned about cost especially that she struggles to cover Eliquis at an increased co-pay and also she usually goes into the donut hole in August.  I do think that the majority of her symptoms are related to COPD. Unfortunately, she did not tolerate metoprolol before due to increased shortness of breath, we can consider switching diltiazem to bisoprolol in the future but she seems to be stable on diltiazem for now.  6.  History of CVA: Embolic in the  setting of being off Eliquis.  Any future interruption of Eliquis will require bridging with low molecular weight heparin.  7.  Hyperlipidemia: Most recent lipid profile showed an LDL of 47.  Continue rosuvastatin 20 mg daily.      Disposition:   FU in 3 months. Signed,  Kathlyn Sacramento, MD  02/11/2022 4:41 PM    [facility]

## 2022-02-12 ENCOUNTER — Telehealth: Payer: Self-pay | Admitting: Cardiovascular Disease

## 2022-02-12 DIAGNOSIS — I5022 Chronic systolic (congestive) heart failure: Secondary | ICD-10-CM

## 2022-02-12 NOTE — Telephone Encounter (Signed)
Update fwd to Dr. Fletcher Anon for him to advise.

## 2022-02-12 NOTE — Telephone Encounter (Signed)
States Dr Fletcher Anon mentioned putting her on Entresto at visit yesterday but patient refused  Patient has changed her mind and would like to start medication  Please send to [facility] in [place] and call to clarify if she is to stop in other medication before starting

## 2022-02-12 NOTE — Telephone Encounter (Signed)
Switch losartan to Entresto 24/26 mg twice daily.  Check basic metabolic profile in 1 week.

## 2022-02-13 ENCOUNTER — Other Ambulatory Visit: Payer: Self-pay

## 2022-02-13 ENCOUNTER — Other Ambulatory Visit: Payer: Self-pay | Admitting: Nurse Practitioner

## 2022-02-13 DIAGNOSIS — J9611 Chronic respiratory failure with hypoxia: Secondary | ICD-10-CM

## 2022-02-13 MED ORDER — ENTRESTO 24-26 MG PO TABS: 1.0000 | ORAL_TABLET | Freq: Two times a day (BID) | ORAL | 5 refills | Status: DC

## 2022-02-13 MED ORDER — THEOPHYLLINE ER 100 MG PO CP24: ORAL_CAPSULE | ORAL | 3 refills | Status: DC

## 2022-02-13 NOTE — Telephone Encounter (Signed)
PA for THEO-24 sent and came back approved 02/13/22 and valid from 12/22/21 to 02/13/23 Sent new rx to pharmacy

## 2022-02-13 NOTE — Telephone Encounter (Signed)
Patient made aware of Dr. Tyrell Antonio response and recommendation. She will take her last dose of losartan today. Rx for Entresto 24/26 mg bid has been sent to the patients pharmacy. She will start [facility] tomorrow. Lab appt for 1 wk bmp scheduled on 02/20/22.  Patient verbalized understanding to the instruction given and voiced appreciation for the call.

## 2022-02-14 LAB — GI PROFILE, STOOL, PCR

## 2022-02-14 LAB — CALPROTECTIN, FECAL: Calprotectin, Fecal: 136 ug/g — ABNORMAL HIGH (ref 0–120)

## 2022-02-15 ENCOUNTER — Other Ambulatory Visit: Payer: Self-pay | Admitting: Cardiovascular Disease

## 2022-02-15 ENCOUNTER — Encounter: Payer: Self-pay | Admitting: Nurse Practitioner

## 2022-02-15 LAB — C DIFFICILE TOXINS A+B W/RFLX: C difficile Toxins A+B, EIA: NEGATIVE

## 2022-02-15 LAB — C DIFFICILE, CYTOTOXIN B

## 2022-02-17 NOTE — Telephone Encounter (Signed)
Refill request

## 2022-02-17 NOTE — Progress Notes (Signed)
Inform no GI bugs seen in stool tests - very likely antibiotics induced diarrhea- enquire if improving- if yes then nothing to do if no then may need to discuss next steps

## 2022-02-17 NOTE — Telephone Encounter (Signed)
Eliquis 5 mg refill request received. Patient is 71 years old, weight- 111.6 kg, Crea- 0.89 on 09/13/21, Diagnosis- afib, and last seen by Dr. Fletcher Anon on 01/23/22. Dose is appropriate based on dosing criteria. Will send in refill to requested pharmacy.

## 2022-02-18 ENCOUNTER — Telehealth (INDEPENDENT_AMBULATORY_CARE_PROVIDER_SITE_OTHER): Payer: Medicare Other | Admitting: Gastroenterology

## 2022-02-18 ENCOUNTER — Other Ambulatory Visit: Payer: Self-pay

## 2022-02-18 DIAGNOSIS — I2510 Atherosclerotic heart disease of native coronary artery without angina pectoris: Secondary | ICD-10-CM | POA: Diagnosis not present

## 2022-02-18 DIAGNOSIS — R197 Diarrhea, unspecified: Secondary | ICD-10-CM | POA: Diagnosis not present

## 2022-02-18 NOTE — Progress Notes (Signed)
Laura [name] , MD [address]  [place]  [place], [place] [address]  Main: [phone]  Fax: [phone]   Primary Care Physician: [name], MD  Virtual Visit via Video Note  I connected with patient on 02/18/22 at  1:15 PM EST by video and verified that I am speaking with the correct person using two identifiers.   I discussed the limitations, risks, security and privacy concerns of performing an evaluation and management service by video  and the availability of in person appointments. I also discussed with the patient that there may be a patient responsible charge related to this service. The patient expressed understanding and agreed to proceed.  Location of Patient: Home Location of Provider: Home Persons involved: Patient and provider only   History of Present Illness: Chief Complaint  Patient presents with   Diarrhea    HPI: Laura [name] is a 71 y.o. female  Summary of history :  Being followed recently since February 2023 for diarrhea felt to be antibiotic induced   She was initially referred and seen on 01/25/2020 for diarrhea.She has previously been a patient of [facility] gastroenterology. She carries a diagnosis of collagenous colitis noted in 2017. Looking back at colon biopsy results from 2013 the biopsy results are not very specific for collagenous colitis. She was hospitalized in November 2020 and underwent resection of the small bowel due to ischemia.  During the surgery she underwent ileum and right colon resection.  Had a second look surgery.She says that prior to the surgery she was having features of constipation alternating with diarrhea.  Since the surgery she has been having only diarrhea up to 10-15 times a day.  At times she has accidents and she has to wear a diaper to prevent soiling her undergarments.  She denies any use of any artificial sugars, NSAIDs, Metformin.  She  transferred  care to our care while  she was on a PPI which has been  stopped. Failed treatment with Questran, imodium and CREON   09/18/2019: CT scan of the abdomen shows status post distal small bowel resection with right hemicolectomy and enterocolonic anastomosis.   01/02/2020: Ferritin 25.  B12 and folate normal.  Hemoglobin 10.7 g in January 2021 with an MCV of 85.   01/27/2020: Fecal calprotectin normal, celiac serology negative.  Stool for C. difficile toxin was negative.  GI PCR of her stool showed enteroaggregative E. coli positive.  Commenced on azithromycin.     03/19/2020: Biopsies taken of the terminal ileum as well as colon.  No evidence of any form of colitis or inflammation.    In November 2021 had a acute right MCA ischemic stroke likely embolic due to atrial fibrillation.  Occurred when she went off her anticoagulation for lumbar injection      Interval history  02/05/2022-02/18/2022  At her last visit on 02/05/2022 I suspected she might have had antibiotic induced diarrhea.  Today she states that she is doing much better her diarrhea significantly improved she is taking to the high-fiber diet consuming regular yogurt and doing much better.  No new acute problems 02/11/2022: C. difficile negative, GI PCR negative fecal calprotectin positive at 136       Current Outpatient Medications  Medication Sig Dispense Refill   Accu-Chek Softclix Lancets lancets Accu-Chek Softclix Lancets  USE AS INSTRUCTED TO CHECK BLOOD SUGARS DAILY 2 HOURS AFTER MEAL     albuterol (VENTOLIN HFA) 108 (90 Base) MCG/ACT inhaler Inhale 2 puffs  into the lungs every 4 (four) hours as needed for wheezing or shortness of breath. 18 g 3   ALPRAZolam (XANAX) 0.25 MG tablet Take 1 tablet (0.25 mg total) by mouth 2 (two) times daily as needed for anxiety. 30 tablet 1   ASPIRIN 81 PO Take by mouth.     benzonatate (TESSALON) 100 MG capsule Take 1 capsule (100 mg total) by mouth 2 (two) times daily as needed for cough. 30 capsule 2   diltiazem (CARDIZEM CD) 240 MG 24 hr capsule  TAKE 1 CAPSULE BY MOUTH EVERY DAY 90 capsule 2   ELIQUIS 5 MG TABS tablet TAKE 1 TABLET BY MOUTH TWICE A DAY 60 tablet 5   EPINEPHrine 0.3 mg/0.3 mL IJ SOAJ injection Inject 0.3 mg into the muscle as needed for anaphylaxis.      fluticasone (FLONASE) 50 MCG/ACT nasal spray Place into both nostrils daily.     furosemide (LASIX) 40 MG tablet Take 1 tablet (40 mg total) by mouth daily. 90 tablet 3   gentamicin cream (GARAMYCIN) 0.1 % Apply 1 application topically 2 (two) times daily. 30 g 1   glucose blood (ACCU-CHEK GUIDE) test strip Use as instructed to check blood sugars daily 2 hours after meal DX E11.65 100 each 12   ipratropium-albuterol (DUONEB) 0.5-2.5 (3) MG/3ML SOLN Use every 6 hours and as needed 450 mL 12   loperamide (IMODIUM) 2 MG capsule Take 4 mg by mouth as needed for diarrhea or loose stools.     methocarbamol (ROBAXIN) 500 MG tablet Take 1 tablet by mouth 3 (three) times daily as needed. (Patient not taking: Reported on 02/11/2022)     montelukast (SINGULAIR) 10 MG tablet TAKE 1 TABLET BY MOUTH EVERY DAY 90 tablet 1   omeprazole (PRILOSEC) 40 MG capsule Take 1 capsule (40 mg total) by mouth daily. 90 capsule 3   ondansetron (ZOFRAN) 4 MG tablet Take 1 tablet (4 mg total) by mouth every 8 (eight) hours as needed for nausea or vomiting. 60 tablet 1   predniSONE (DELTASONE) 10 MG tablet Take 1 tablet (10 mg total) by mouth daily with breakfast. 90 tablet 1   Probiotic Product (PROBIOTIC-10) CAPS Take 1 capsule by mouth daily.      promethazine (PHENERGAN) 12.5 MG tablet TAKE 1 TABLET (12.5 MG TOTAL) BY MOUTH EVERY 12 (TWELVE) HOURS AS NEEDED FOR NAUSEA OR VOMITING. 30 tablet 0   rosuvastatin (CRESTOR) 20 MG tablet TAKE 1 TABLET BY MOUTH EVERY DAY 90 tablet 3   sacubitril-valsartan (ENTRESTO) 24-26 MG Take 1 tablet by mouth 2 (two) times daily. 60 tablet 5   spironolactone (ALDACTONE) 25 MG tablet TAKE 1 TABLET (25 MG TOTAL) BY MOUTH DAILY. 90 tablet 0   traMADol (ULTRAM) 50 MG tablet  Take 50 mg by mouth 4 (four) times daily as needed.     No current facility-administered medications for this visit.    Allergies as of 02/18/2022 - Review Complete 02/15/2022  Allergen Reaction Noted   Flecainide Shortness Of Breath 11/27/2016   Metoprolol Shortness Of Breath, Swelling, and Other (See Comments) 01/13/2020   Propafenone Shortness Of Breath, Swelling, and Other (See Comments) 12/01/2016   Rivaroxaban Swelling and Other (See Comments) 10/23/2016    Review of Systems:    All systems reviewed and negative except where noted in HPI.  General Appearance:    Alert, cooperative, no distress, appears stated age  Head:    Normocephalic, without obvious abnormality, atraumatic  Eyes:    PERRL, conjunctiva/corneas clear,  Ears:    Grossly normal hearing    Neurologic:  Grossly normal    Observations/Objective:  Labs: CMP     Component Value Date/Time   NA 131 (L) 01/03/2022 1656   NA 143 09/13/2021 1152   NA 140 11/28/2013 2312   K 4.0 01/03/2022 1656   K 4.1 11/28/2013 2312   CL 95 (L) 01/03/2022 1656   CL 108 (H) 11/28/2013 2312   CO2 25 01/03/2022 1656   CO2 27 11/28/2013 2312   GLUCOSE 151 (H) 01/03/2022 1656   GLUCOSE 130 (H) 11/28/2013 2312   BUN 19 01/03/2022 1656   BUN 17 09/13/2021 1152   BUN 29 (H) 11/28/2013 2312   CREATININE 1.04 (H) 01/03/2022 1656   CREATININE 0.79 11/28/2013 2312   CALCIUM 9.4 01/03/2022 1656   CALCIUM 8.7 11/28/2013 2312   PROT 7.0 01/03/2022 1656   PROT 6.5 03/18/2021 0916   PROT 6.9 11/28/2013 2312   ALBUMIN 4.1 01/03/2022 1656   ALBUMIN 4.3 03/18/2021 0916   ALBUMIN 3.6 11/28/2013 2312   AST 19 01/03/2022 1656   AST 24 11/28/2013 2312   ALT 23 01/03/2022 1656   ALT 21 11/28/2013 2312   ALKPHOS 46 01/03/2022 1656   ALKPHOS 65 11/28/2013 2312   BILITOT 1.1 01/03/2022 1656   BILITOT 0.7 03/18/2021 0916   BILITOT 0.3 11/28/2013 2312   GFRNONAA 57 (L) 01/03/2022 1656   GFRNONAA >60 11/28/2013 2312   GFRAA 80 12/04/2020  1517   GFRAA >60 11/28/2013 2312   Lab Results  Component Value Date   WBC 13.9 (H) 01/03/2022   HGB 15.1 (H) 01/03/2022   HCT 43.8 01/03/2022   MCV 94.2 01/03/2022   PLT 296 01/03/2022    Imaging Studies: ECHOCARDIOGRAM COMPLETE  Result Date: 01/29/2022    ECHOCARDIOGRAM REPORT   Patient Name:   ANAIJAH AUGSBURGER Date of Exam: 01/29/2022 Medical Rec #:  [id number]        Height:       64.0 in Accession #:    [id number]       Weight:       246.2 lb Date of Birth:  04-Feb-1951         BSA:          2.137 m Patient Age:    37 years         BP:           142/72 mmHg Patient Gender: F                HR:           100 bpm. Exam Location:  [place] Procedure: 2D Echo, Cardiac Doppler and Color Doppler Indications:    D63.87 Chronic systolic (congestive) heart failure  History:        Patient has prior history of Echocardiogram examinations, most                 recent 11/21/2020. Cardiomyopathy, PAD and COPD,                 Arrythmias:Atrial Fibrillation, Signs/Symptoms:Shortness of                 Breath; Risk Factors:Hypertension, Non-Smoker and Sleep Apnea.  Sonographer:    Wilkie Aye RVT Sonographer#2:  Caesar Chestnut Referring Phys: Badger Lee  1. Left ventricular ejection fraction, by estimation, is 40 to 45%. The left ventricle has mild to moderately decreased function. The left ventricle demonstrates global hypokinesis. The  left ventricular internal cavity size was severely dilated. Left ventricular diastolic parameters are indeterminate.  2. Right ventricular systolic function is normal. The right ventricular size is normal.  3. Left atrial size was mild to moderately dilated.  4. Right atrial size was mildly dilated.  5. The mitral valve is grossly normal. Moderate mitral valve regurgitation.  6. The aortic valve is tricuspid. Aortic valve regurgitation is mild. Aortic valve sclerosis is present, with no evidence of aortic valve stenosis.  7. The inferior vena cava is dilated in  size with >50% respiratory variability, suggesting right atrial pressure of 8 mmHg.  8. Evidence of atrial level shunting detected by color flow Doppler. Comparison(s): LVEF 45-50%. FINDINGS  Left Ventricle: Left ventricular ejection fraction, by estimation, is 40 to 45%. The left ventricle has mild to moderately decreased function. The left ventricle demonstrates global hypokinesis. The left ventricular internal cavity size was severely dilated. There is no left ventricular hypertrophy. Left ventricular diastolic parameters are indeterminate. Right Ventricle: The right ventricular size is normal. No increase in right ventricular wall thickness. Right ventricular systolic function is normal. Left Atrium: Left atrial size was mild to moderately dilated. Right Atrium: Right atrial size was mildly dilated. Pericardium: There is no evidence of pericardial effusion. Mitral Valve: The mitral valve is grossly normal. Mild mitral annular calcification. Moderate mitral valve regurgitation. Tricuspid Valve: The tricuspid valve is normal in structure. Tricuspid valve regurgitation is not demonstrated. Aortic Valve: The aortic valve is tricuspid. Aortic valve regurgitation is mild. Aortic valve sclerosis is present, with no evidence of aortic valve stenosis. Aortic valve mean gradient measures 6.0 mmHg. Aortic valve peak gradient measures 9.2 mmHg. Aortic valve area, by VTI measures 2.02 cm. Pulmonic Valve: The pulmonic valve was not well visualized. Pulmonic valve regurgitation is not visualized. Aorta: The aortic root and ascending aorta are structurally normal, with no evidence of dilitation. Venous: The inferior vena cava is dilated in size with greater than 50% respiratory variability, suggesting right atrial pressure of 8 mmHg. IAS/Shunts: Evidence of atrial level shunting detected by color flow Doppler.  LEFT VENTRICLE PLAX 2D LVIDd:         6.90 cm LVIDs:         5.10 cm LV PW:         0.80 cm LV IVS:        1.00 cm  LVOT diam:     2.00 cm LV SV:         58 LV SV Index:   27 LVOT Area:     3.14 cm  LV Volumes (MOD) LV vol d, MOD A2C: 104.0 ml LV vol d, MOD A4C: 114.0 ml LV vol s, MOD A2C: 49.5 ml LV vol s, MOD A4C: 66.7 ml LV SV MOD A2C:     54.5 ml LV SV MOD A4C:     114.0 ml LV SV MOD BP:      51.8 ml RIGHT VENTRICLE          IVC RV Basal diam:  3.50 cm  IVC diam: 2.10 cm RV Mid diam:    2.50 cm TAPSE (M-mode): 2.4 cm LEFT ATRIUM             Index        RIGHT ATRIUM           Index LA diam:        5.40 cm 2.53 cm/m   RA Area:     23.10 cm LA Vol (A2C):  86.2 ml 40.34 ml/m  RA Volume:   73.50 ml  34.39 ml/m LA Vol (A4C):   78.8 ml 36.87 ml/m LA Biplane Vol: 84.8 ml 39.68 ml/m  AORTIC VALVE                     PULMONIC VALVE AV Area (Vmax):    2.17 cm      PV Vmax:          1.11 m/s AV Area (Vmean):   1.90 cm      PV Peak grad:     4.9 mmHg AV Area (VTI):     2.02 cm      PR End Diast Vel: 5.11 msec AV Vmax:           152.00 cm/s AV Vmean:          117.000 cm/s AV VTI:            0.289 m AV Peak Grad:      9.2 mmHg AV Mean Grad:      6.0 mmHg LVOT Vmax:         105.00 cm/s LVOT Vmean:        70.700 cm/s LVOT VTI:          0.186 m LVOT/AV VTI ratio: 0.64 AR Vena Contracta: 0.60 cm  AORTA Ao Root diam: 3.40 cm Ao Asc diam:  3.50 cm MR Peak grad: 117.5 mmHg  TRICUSPID VALVE MR Mean grad: 78.7 mmHg   TR Peak grad:   34.8 mmHg MR Vmax:      542.00 cm/s TR Vmax:        295.00 cm/s MR Vmean:     412.7 cm/s                           SHUNTS                           Systemic VTI:  0.19 m                           Systemic Diam: 2.00 cm Kate Sable MD Electronically signed by Kate Sable MD Signature Date/Time: 01/29/2022/7:21:18 PM    Final     Assessment and Plan:   NIKKOL PAI is a 71 y.o. y/o female  here to see me for diarrhea since a course of antibiotic and NSAIDs for skin infection after cut from a fall.  Suspected antibiotic induced diarrhea.  Stool tested negative for infection.  It is gradually  getting better.  I informed her that usually it resolves on its own.  If the symptoms were to recur or to worsen to call my office and we will have a discussion again     I discussed the assessment and treatment plan with the patient. The patient was provided an opportunity to ask questions and all were answered. The patient agreed with the plan and demonstrated an understanding of the instructions.   The patient was advised to call back or seek an in-person evaluation if the symptoms worsen or if the condition fails to improve as anticipated.  I provided 7 minutes of face-to-face time during this encounter.  Dr Laura Bellows MD,MRCP [facility]) Gastroenterology/Hepatology Pager: [phone]   Speech recognition software was used to dictate this note.

## 2022-02-20 ENCOUNTER — Other Ambulatory Visit: Payer: Self-pay

## 2022-02-20 ENCOUNTER — Other Ambulatory Visit (INDEPENDENT_AMBULATORY_CARE_PROVIDER_SITE_OTHER): Payer: Medicare Other

## 2022-02-20 DIAGNOSIS — I5022 Chronic systolic (congestive) heart failure: Secondary | ICD-10-CM

## 2022-02-21 ENCOUNTER — Other Ambulatory Visit: Payer: Self-pay | Admitting: Cardiovascular Disease

## 2022-02-21 LAB — BASIC METABOLIC PANEL
BUN/Creatinine Ratio: 18 (ref 12–28)
BUN: 24 mg/dL (ref 8–27)
CO2: 22 mmol/L (ref 20–29)
Calcium: 9.8 mg/dL (ref 8.7–10.3)
Chloride: 102 mmol/L (ref 96–106)
Creatinine, Ser: 1.36 mg/dL — ABNORMAL HIGH (ref 0.57–1.00)
Glucose: 119 mg/dL — ABNORMAL HIGH (ref 70–99)
Potassium: 4.8 mmol/L (ref 3.5–5.2)
Sodium: 141 mmol/L (ref 134–144)
eGFR: 42 mL/min/{1.73_m2} — ABNORMAL LOW (ref >59)

## 2022-02-24 ENCOUNTER — Telehealth: Payer: Self-pay | Admitting: Cardiovascular Disease

## 2022-02-24 DIAGNOSIS — R600 Localized edema: Secondary | ICD-10-CM

## 2022-02-24 MED ORDER — FUROSEMIDE 20 MG PO TABS: 20.0000 mg | ORAL_TABLET | Freq: Every day | ORAL | Status: DC

## 2022-02-24 NOTE — Telephone Encounter (Signed)
I agree that Laura [name] should not be causing shortness of breath.  Her labs did show slight worsening of renal function and thus I recommended decreasing Lasix to 20 mg once daily (sent you a result note on that).  Agree with pulmonary evaluation. ?

## 2022-02-24 NOTE — Telephone Encounter (Signed)
Spoke with the patient. ?Patient reports increased sob over the last 3-4 days. ?Patient does sound audibly sob on the phone. ? ?Patient weighs daily and her weight has been stable at her baseline of 240 lbs. Patient VS are acceptable. ?BP this morning 127/67. Patient rechecked while I held the line.157/85 94 bpm (taken after she ambulated to get her BP machine). ?She denies orthopnea or PND. No increased LE swelling. ? ?She has been taking Entresto for almost 2 weeks. No rash or swelling. Adv the patient that I did not think Entresto was causing her SOB. ? ?She has had to use more oxygen at home. She has misplaced her pulse ox and unable to check her O2 sat. She plans on repurchasing one today. She is recovering from a bout of diarrhea last week. She ws seen by GI and c-diff ruled out, she was advised that it was related to an antibiotic she was taking. ? ?She has remained well hydrated and reports good urine output. Adv the patient that it does not seem that she is having volume overload. Recommended that she contact her Pulmonologist as well to provide an update since  the sob could be coming from her lung issues. ?Adv the patient that I will fwd the update to Dr. Fletcher Anon to see if he has additional recommendations. ? ?Patient agreeable with the plan and voiced appreciation for the call back. ?

## 2022-02-24 NOTE — Telephone Encounter (Signed)
Patient made aware of lab results and Dr. Tyrell Antonio recommendation. ?Patient verbalized understanding. ?

## 2022-02-24 NOTE — Telephone Encounter (Signed)
-----   Message from Wellington Hampshire, MD sent at 02/24/2022  1:23 PM EST ----- ?Mild worsening of renal function.  Recommend decreasing Lasix to 20 mg daily. ? ?

## 2022-02-24 NOTE — Telephone Encounter (Signed)
Pt c/o Shortness Of Breath: STAT if SOB developed within the last 24 hours or pt is noticeably SOB on the phone  1. Are you currently SOB (can you hear that pt is SOB on the phone)? yes  2. How long have you been experiencing SOB? About 3-4 days ago, she started taking Entresto about a week ago and she thinks her SOB is coming from the [place].  3. Are you SOB when sitting or when up moving around? Up moving around   4. Are you currently experiencing any other symptoms? No other symptoms.

## 2022-03-19 NOTE — Progress Notes (Signed)
?[facility] ?[address] ?[place]. [place] [address] ?[phone] ? ?     STROKE FOLLOW UP NOTE ? ?[name] ?Date of Birth:  March 06, 1951 ?Medical Record Number:  [id number]  ? ?Reason for Referral: stroke follow up ? ? ? ?SUBJECTIVE: ? ? ?CHIEF COMPLAINT:  ?Chief Complaint  ?Patient presents with  ? Follow-up  ?  Rm 3 alone ?Pt is well, still having weakness and L leg dragging.  ? ? ? ? ?HPI:  ? ?Update 03/19/2022 JM: Patient returns for 72-monthstroke follow-up unaccompanied.  Overall stable from stroke standpoint without new stroke/TIA symptoms.  Reports residual left sided weakness.  Completed home health therapies with improvement of ambulation and left leg weakness. Does have left leg swelling but improvement with exercise. Continues to have left shoulder limited range of motion and pain. [facility] right shoulder rotator cuff injury but has not been seen for left shoulder symptoms. Will use RW when outdoors, does not always use in home, no recent falls.  Compliant on Eliquis and Crestor, denies side effects.  Blood pressure today 117/70.  Monitors glucose levels at home, has been stable between 80-100 fasting.  Reports nightly use of CPAP.  Closely followed by PCP, cardiology,  and pulmonology.  No further concerns at this time. ? ? ? ?History provided for reference purposes only ?Update 09/18/2021 JM: Ms. ODoeringreturns for 72-monthtroke follow-up.  [facility] with 2 days of left hand and foot numbness/tingling that progressed to weakness on left side and 1 day of slurred speech as well as headache and nausea in setting of decreased p.o. intake over the past month.  CT head and MRI negative for acute stroke. Felt decreased oral intake possibly contributing to worsening baseline stroke deficits. Doing well since that time without new or worsening stroke/TIA symptoms.  She completed  therapies for lower back issues and is questioning if she would be eligible for home health therapy for residual stroke deficitas she does not drive and has had difficulty with transportation.  She did receive an AFO brace which has been beneficial.  Continues to use Rollator walker without any recent falls.  She continues to stay active as tolerated and continues to work on weight loss.  She does endorse occasional situational anxiety with use of Xanax with benefit managed by PCP. Remains on aspirin and eliquis as well as Crestor without side effects.  Blood pressure today 128/66.  No further concerns at this time. ? ?Update 06/12/2021 JM: Laura [name] for recent hospitalization follow-up.  [facility] with a headache and a "whooshing" sensation in her head associated with new onset right-sided weakness with numbness and facial weakness.  CTA head/neck showed concern of possible new stroke in left MCA distribution with noted hypoattenuation and loss of gray-white matter differentiation but negative for LVO or significant stenosis on left side.  MRI not completed.  Lipid panel and A1c not completed.  She was advised to continue Eliquis and aspirin 81 mg daily and continue Crestor and discharged back home.  She was advised to follow-up with our office at discharge. ? ?Since discharge, she reports weakness in both arms and legs, imbalance, blurred vision, speech difficulty, memory loss and occasional swallowing difficulties.  She was making improvement in regards to prior left-sided weakness but she feels as though this slightly worsened with recent stroke.  Occasional  difficulty swallowing dry foods such as bread but no difficulty with thin liquids.  She believes this could be due to dryness.  She will have occasional difficulty saying the correct word or word finding difficulty.  She is currently working with [facility] for chronic lower back pain recently receiving  bilateral SI joint injections which was beneficial but has had some worsening back pain since Saturday.  She is ambulating with Rollator walker and did have a fall yesterday but thankfully without injury.  Also reports LLE swelling and foot drop -has appointment with vascular 7/1.  She is also requesting a foot drop brace which was discussed with her prior [facility] PT. Denies new stroke/TIA symptoms since Saturday.  Compliant on aspirin and Eliquis as well as Crestor without associated side effects.  Blood pressure today 113/58.  Routinely monitors glucose levels at home.  No further concerns at this time. ? ? ?CTA head/neck ?IMPRESSION: ?Hypoattenuation and loss of gray-white differentiation in the left MCA distribution, concerning for acute/subacute infarction. ?No CTA evidence of large vessel occlusion. ? ?Update 04/03/2021 JM: [name] returns for 72-monthstroke follow-up unaccompanied ? ?She has been doing much better since prior visit with improvement of left-sided weakness.  Continues to experience left shoulder stiffness and left ankle weakness/instability but overall greatly recovering.  She has been working with PT which has been beneficial.  Denies new or worsening stroke/TIA symptoms ? ?Continues to struggle with low back pain and radiculopathy routinely followed by [facility] and plans on possibly obtaining SI joint injections for further symptomatic relief ? ?Compliant on Eliquis and Crestor without associated side effects ?Blood pressure today 121/70 ?Reports nightly use of CPAP for OSA management ? ?Prior concerns of nausea and altered taste/smell has since subsided and has been able to maintain adequate nutritional intake.  She has been trying to maintain a healthy diet and her current weight loss goal is to reach 200lbs.  Today, weight currently at 236lbs; prior to her stroke she was at 268 lbs! ? ?No further concerns at this time ? ?Initial visit 01/02/2021 JM: Laura [name] being seen for hospital  follow-up unaccompanied. Her main concern today is nausea with altered taste and smell limiting oral intake which has been present since her stroke. She reports 30 lb weight loss over the past 2 months.  She denies dizziness, vertigo or any other associated symptom contributing to nausea.  She is currently receiving therapy at home for residual stroke deficits of mild left sided weakness and gait impairment. Denies residual dysphagia. Use of rollator walker and at times has been using cane.  Denies new or worsening stroke/TIA symptoms.  She has remained on Eliquis 5 mg twice daily without bleeding or bruising.  Remains on Crestor 20 mg daily without myalgias.  Blood pressure today 144/78.  Reports nightly use of CPAP for OSA management.  No further concerns at this time. ? ?Stroke admission 11/20/2020 ?[name] a 71y.o. female with history of chronic atrial fibrillation on Eliquis, COPD, gastroesophageal reflux disease, heart failure with midrange ejection fraction, hypertension, hyperlipidemia, nonischemic cardiomyopathy, obstructive sleep apnea, obesity, who presented on 11/20/2020 with L sided weakness w/ R gaze preference, slurred speech and L facial droop.  Personally reviewed hospitalization pertinent progress notes, lab work and imaging with summary provided.  Evaluated by Dr. XErlinda Hongwith stroke work-up revealing patchy right MCA infarcts due to right M1 occlusion s/p IR w/ TICI 3 revascularization, infarct embolic secondary to known AF.  Post IR  small SAH post  persylvian fissure.  Eliquis was held 11/20-11/24 for lumbar injection and restarted 11/25 but apparently few episodes of left hand weakness and some episodic HA and confusion.  Advised to resume Eliquis 12/5 (5 days post stroke) for secondary stroke prevention.  History of HTN and resumed home dose Cardizem, Lasix and spironolactone.  History of HLD on Crestor 20 mg daily with LDL 80.  No prior history of DM.  Other stroke risk factors  include advanced age, morbid obesity, OSA on CPAP, CAD, systolic CHF, NICM with a EF 40 to 45%, hx of DVT and PAD.  No prior stroke history.  Other problems include anxiety, COPD and GERD.  Residual deficits of mild

## 2022-03-20 ENCOUNTER — Ambulatory Visit (INDEPENDENT_AMBULATORY_CARE_PROVIDER_SITE_OTHER): Payer: Medicare Other | Admitting: Adult Health

## 2022-03-20 ENCOUNTER — Encounter: Payer: Self-pay | Admitting: Adult Health

## 2022-03-20 VITALS — BP 117/70 | HR 69 | Ht 64.0 in | Wt 246.0 lb

## 2022-03-20 DIAGNOSIS — I639 Cerebral infarction, unspecified: Secondary | ICD-10-CM

## 2022-03-20 DIAGNOSIS — M7502 Adhesive capsulitis of left shoulder: Secondary | ICD-10-CM | POA: Diagnosis not present

## 2022-03-20 DIAGNOSIS — I69354 Hemiplegia and hemiparesis following cerebral infarction affecting left non-dominant side: Secondary | ICD-10-CM | POA: Diagnosis not present

## 2022-03-20 NOTE — Patient Instructions (Addendum)
Referral placed to [facility] for evaluation of shoulder pain after your stroke  ? ?Continue Eliquis (apixaban) daily  and Crestor for secondary stroke prevention ?All medication refills will need to obtained from PCP and/or cardiology ? ?Continue close follow-up with cardiology and pulmonology as scheduled ? ?Continue to follow up with PCP regarding cholesterol and blood pressure management  ?Maintain strict control of hypertension with blood pressure goal below 130/90 and cholesterol with LDL cholesterol (bad cholesterol) goal below 70 mg/dL.  ? ?Signs of a Stroke? Follow the BEFAST method:  ?Balance Watch for a sudden loss of balance, trouble with coordination or vertigo ?Eyes Is there a sudden loss of vision in one or both eyes? Or double vision?  ?Face: Ask the person to smile. Does one side of the face droop or is it numb?  ?Arms: Ask the person to raise both arms. Does one arm drift downward? Is there weakness or numbness of a leg? ?Speech: Ask the person to repeat a simple phrase. Does the speech sound slurred/strange? Is the person confused ? ?Time: If you observe any of these signs, call 911. ? ? ? ? ? ? ?Thank you for coming to see [place] at [facility]. I hope we have been able to provide you high quality care today. ? ?You may receive a patient satisfaction survey over the next few weeks. We would appreciate your feedback and comments so that we may continue to improve ourselves and the health of our patients. ? ? ? ?Adhesive Capsulitis ?Adhesive capsulitis, also called frozen shoulder, causes the shoulder to become stiff and painful to move. This condition happens when there is inflammation of the tendons and ligaments that surround the shoulder joint (shoulder capsule). ?What are the causes? ?This condition may be caused by: ?An injury to your shoulder joint. ?Straining your shoulder. ?Not moving your shoulder for a period of time. This can happen if your arm was injured or in a  sling. ?Long-standing conditions, such as: ?Diabetes. ?Thyroid problems. ?Heart disease. ?Stroke. ?Rheumatoid arthritis. ?Lung disease. ?In some cases, the cause is not known. ?What increases the risk? ?You are more likely to develop this condition if you are: ?A woman. ?Older than 72 years of age. ?What are the signs or symptoms? ?Symptoms of this condition include: ?Pain in your shoulder when you move your arm. There may also be pain when parts of your shoulder are touched. The pain may be worse at night or when you are resting. ?A sore or aching shoulder. ?The inability to move your shoulder normally. ?Muscle spasms. ?How is this diagnosed? ?This condition is diagnosed with a physical exam and imaging tests, such as an X-ray or MRI. ?How is this treated? ?This condition may be treated with: ?Treatment of the underlying cause or condition. ?Medicine. Medicine may be given to relieve pain, inflammation, or muscle spasms. ?Steroid injections into the shoulder joint. ?Physical therapy. This involves performing exercises to get the shoulder moving again. ?Acupuncture. This is a type of treatment that involves stimulating specific points on your body by inserting thin needles through your skin. ?Shoulder manipulation. This is a procedure to move the shoulder into another position. It is done after you are given a medicine to make you fall asleep (general anesthetic). The joint may also be injected with salt water at high pressure to break down scarring. ?Surgery. This may be done in severe cases when other treatments have failed. ?Although most people recover completely from adhesive capsulitis, some may not regain full  shoulder movement. ?Follow these instructions at home: ?Managing pain, stiffness, and swelling ?  ?If directed, put ice on the injured area: ?Put ice in a plastic bag. ?Place a towel between your skin and the bag. ?Leave the ice on for 20 minutes, 2-3 times per day. ?If directed, apply heat to the  affected area before you exercise. Use the heat source that your health care provider recommends, such as a moist heat pack or a heating pad. ?Place a towel between your skin and the heat source. ?Leave the heat on for 20-30 minutes. ?Remove the heat if your skin turns bright red. This is especially important if you are unable to feel pain, heat, or cold. You may have a greater risk of getting burned. ?General instructions ?Take over-the-counter and prescription medicines only as told by your health care provider. ?If you are being treated with physical therapy, follow instructions from your physical therapist. ?Avoid exercises that put a lot of demand on your shoulder, such as throwing. These exercises can make pain worse. ?Keep all follow-up visits as told by your health care provider. This is important. ?Contact a health care provider if: ?You develop new symptoms. ?Your symptoms get worse. ?Summary ?Adhesive capsulitis, also called frozen shoulder, causes the shoulder to become stiff and painful to move. ?You are more likely to have this condition if you are a woman and over age 45. ?It is treated with physical therapy, medicines, and sometimes surgery. ?This information is not intended to replace advice given to you by your health care provider. Make sure you discuss any questions you have with your health care provider. ?Document Revised: 08/05/2021 Document Reviewed: 08/05/2021 ?Elsevier Patient Education ? 2022 [place]. ? ? ? ? ?Stroke Prevention ?Some medical conditions and lifestyle choices can lead to a higher risk for a stroke. You can help to prevent a stroke by eating healthy foods and exercising. It also helps to not smoke and to manage any health problems you may have. ?How can this condition affect me? ?A stroke is an emergency. It should be treated right away. A stroke can lead to brain damage or threaten your life. There is a better chance of surviving and getting better after a stroke if you  get medical help right away. ?What can increase my risk? ?The following medical conditions may increase your risk of a stroke: ?Diseases of the heart and blood vessels (cardiovascular disease). ?High blood pressure (hypertension). ?Diabetes. ?High cholesterol. ?Sickle cell disease. ?Problems with blood clotting. ?Being very overweight. ?Sleeping problems (obstructivesleep apnea). ?Other risk factors include: ?Being older than age 62. ?A history of blood clots, stroke, or mini-stroke (TIA). ?Race, ethnic background, or a family history of stroke. ?Smoking or using tobacco products. ?Taking birth control pills, especially if you smoke. ?Heavy alcohol and drug use. ?Not being active. ?What actions can I take to prevent this? ?Manage your health conditions ?High cholesterol. ?Eat a healthy diet. If this is not enough to manage your cholesterol, you may need to take medicines. ?Take medicines as told by your doctor. ?High blood pressure. ?Try to keep your blood pressure below 130/80. ?If your blood pressure cannot be managed through a healthy diet and regular exercise, you may need to take medicines. ?Take medicines as told by your doctor. ?Ask your doctor if you should check your blood pressure at home. ?Have your blood pressure checked every year. ?Diabetes. ?Eat a healthy diet and get regular exercise. If your blood sugar (glucose) cannot be managed through diet  and exercise, you may need to take medicines. ?Take medicines as told by your doctor. ?Talk to your doctor about getting checked for sleeping problems. Signs of a problem can include: ?Snoring a lot. ?Feeling very tired. ?Make sure that you manage any other conditions you have. ?Nutrition ? ?Follow instructions from your doctor about what to eat or drink. You may be told to: ?Eat and drink fewer calories each day. ?Limit how much salt (sodium) you use to 1,500 milligrams (mg) each day. ?Use only healthy fats for cooking, such as olive oil, canola oil, and  sunflower oil. ?Eat healthy foods. To do this: ?Choose foods that are high in fiber. These include whole grains, and fresh fruits and vegetables. ?Eat at least 5 servings of fruits and vegetables a day. Try to fill

## 2022-03-24 ENCOUNTER — Telehealth: Payer: Self-pay | Admitting: Adult Health

## 2022-03-24 NOTE — Telephone Encounter (Signed)
Referral sent to [facility] [phone]. ?

## 2022-03-28 DIAGNOSIS — M12819 Other specific arthropathies, not elsewhere classified, unspecified shoulder: Secondary | ICD-10-CM | POA: Insufficient documentation

## 2022-03-28 DIAGNOSIS — M75100 Unspecified rotator cuff tear or rupture of unspecified shoulder, not specified as traumatic: Secondary | ICD-10-CM | POA: Insufficient documentation

## 2022-03-28 DIAGNOSIS — S46011A Strain of muscle(s) and tendon(s) of the rotator cuff of right shoulder, initial encounter: Secondary | ICD-10-CM | POA: Diagnosis not present

## 2022-03-31 DIAGNOSIS — M25512 Pain in left shoulder: Secondary | ICD-10-CM | POA: Diagnosis not present

## 2022-03-31 DIAGNOSIS — M25612 Stiffness of left shoulder, not elsewhere classified: Secondary | ICD-10-CM | POA: Diagnosis not present

## 2022-04-01 ENCOUNTER — Other Ambulatory Visit: Payer: Self-pay | Admitting: Internal Medicine

## 2022-04-01 DIAGNOSIS — J441 Chronic obstructive pulmonary disease with (acute) exacerbation: Secondary | ICD-10-CM

## 2022-04-04 ENCOUNTER — Telehealth (INDEPENDENT_AMBULATORY_CARE_PROVIDER_SITE_OTHER): Payer: Medicare Other | Admitting: Nurse Practitioner

## 2022-04-04 ENCOUNTER — Encounter: Payer: Self-pay | Admitting: Nurse Practitioner

## 2022-04-04 VITALS — BP 120/67 | HR 98 | Resp 16 | Ht 64.0 in | Wt 241.0 lb

## 2022-04-04 DIAGNOSIS — G479 Sleep disorder, unspecified: Secondary | ICD-10-CM | POA: Diagnosis not present

## 2022-04-04 NOTE — Progress Notes (Signed)
[facility] ?[address] ?[place], [place] [address] ? ?Internal MEDICINE  ?Telephone Visit ? ?Patient Name: Laura [name] ? [id number]  ?[id number] ? ?Date of Service: 04/04/2022 ? ?I connected with the patient at 12:45 PM by telephone and verified the patients identity using two identifiers.   ?I discussed the limitations, risks, security and privacy concerns of performing an evaluation and management service by telephone and the availability of in person appointments. I also discussed with the patient that there may be a patient responsible charge related to the service.  The patient expressed understanding and agrees to proceed.   ? ?Chief Complaint  ?Patient presents with  ? Telephone Screen  ?  [phone]  ? Telephone Assessment  ?  Pt checked Blood Sugar this morning: 104  ? Sleeping Problem  ?  Wants to discuss having a sleep aid  ? ? ?HPI ?Laura [name] presents for a telehealth virtual visit for trouble sleeping.  She has tried taking over-the-counter Benadryl to help her sleep but it is not helping her sleep and it is actually making her feel restless and hyperactive.  Patient has not trialed any prescription medications. ? ? ?Current Medication: ?Outpatient Encounter Medications as of 04/04/2022  ?Medication Sig  ? Accu-Chek Softclix Lancets lancets Accu-Chek Softclix Lancets ? USE AS INSTRUCTED TO CHECK BLOOD SUGARS DAILY 2 HOURS AFTER MEAL  ? albuterol (VENTOLIN HFA) 108 (90 Base) MCG/ACT inhaler Inhale 2 puffs into the lungs every 4 (four) hours as needed for wheezing or shortness of breath.  ? ALPRAZolam (XANAX) 0.25 MG tablet Take 1 tablet (0.25 mg total) by mouth 2 (two) times daily as needed for anxiety.  ? ASPIRIN 81 PO Take by mouth.  ? benzonatate (TESSALON) 100 MG capsule Take 1 capsule (100 mg total) by mouth 2 (two) times daily as needed for cough.  ? diltiazem (CARDIZEM CD) 240 MG 24 hr capsule TAKE 1 CAPSULE BY MOUTH EVERY DAY  ? ELIQUIS 5 MG TABS tablet TAKE 1 TABLET BY MOUTH  TWICE A DAY  ? EPINEPHrine 0.3 mg/0.3 mL IJ SOAJ injection Inject 0.3 mg into the muscle as needed for anaphylaxis.   ? fluticasone (FLONASE) 50 MCG/ACT nasal spray Place into both nostrils daily.  ? furosemide (LASIX) 20 MG tablet Take 1 tablet (20 mg total) by mouth daily.  ? gentamicin cream (GARAMYCIN) 0.1 % Apply 1 application topically 2 (two) times daily.  ? glucose blood (ACCU-CHEK GUIDE) test strip Use as instructed to check blood sugars daily 2 hours after meal DX E11.65  ? ipratropium-albuterol (DUONEB) 0.5-2.5 (3) MG/3ML SOLN Use every 6 hours and as needed  ? loperamide (IMODIUM) 2 MG capsule Take 4 mg by mouth as needed for diarrhea or loose stools.  ? methocarbamol (ROBAXIN) 500 MG tablet Take 1 tablet by mouth 3 (three) times daily as needed.  ? montelukast (SINGULAIR) 10 MG tablet TAKE 1 TABLET BY MOUTH EVERY DAY  ? omeprazole (PRILOSEC) 40 MG capsule Take 1 capsule (40 mg total) by mouth daily.  ? ondansetron (ZOFRAN) 4 MG tablet Take 1 tablet (4 mg total) by mouth every 8 (eight) hours as needed for nausea or vomiting.  ? predniSONE (DELTASONE) 10 MG tablet TAKE 1 TABLET (10 MG TOTAL) BY MOUTH DAILY WITH BREAKFAST.  ? Probiotic Product (PROBIOTIC-10) CAPS Take 1 capsule by mouth daily.   ? promethazine (PHENERGAN) 12.5 MG tablet TAKE 1 TABLET (12.5 MG TOTAL) BY MOUTH EVERY 12 (TWELVE) HOURS AS NEEDED FOR NAUSEA OR VOMITING.  ?  rosuvastatin (CRESTOR) 20 MG tablet TAKE 1 TABLET BY MOUTH EVERY DAY  ? sacubitril-valsartan (ENTRESTO) 24-26 MG Take 1 tablet by mouth 2 (two) times daily.  ? spironolactone (ALDACTONE) 25 MG tablet TAKE 1 TABLET (25 MG TOTAL) BY MOUTH DAILY.  ? traMADol (ULTRAM) 50 MG tablet Take 50 mg by mouth 4 (four) times daily as needed.  ? ?No facility-administered encounter medications on file as of 04/04/2022.  ? ? ?Surgical History: ?Past Surgical History:  ?Procedure Laterality Date  ? BOWEL RESECTION  09/11/2019  ? Procedure: SMALL BOWEL RESECTION;  Surgeon: Herbert Pun,  MD;  Location: [facility] ORS;  Service: General;;  ? CARDIAC CATHETERIZATION    ? cataract surgery    ? COLONOSCOPY WITH PROPOFOL N/A 03/19/2020  ? Procedure: COLONOSCOPY WITH PROPOFOL;  Surgeon: [name], MD;  Location: [facility] ENDOSCOPY;  Service: Gastroenterology;  Laterality: N/A;  ? CORONARY ANGIOPLASTY    ? INCISION AND DRAINAGE ABSCESS Right 06/29/2016  ? Procedure: INCISION AND DRAINAGE ABSCESS;  Surgeon: Florene Glen, MD;  Location: [facility] ORS;  Service: General;  Laterality: Right;  ? INCISION AND DRAINAGE OF WOUND Left 06/29/2016  ? Procedure: IRRIGATION AND DEBRIDEMENT WOUND;  Surgeon: Florene Glen, MD;  Location: [facility] ORS;  Service: General;  Laterality: Left;  ? IR ANGIO VERTEBRAL SEL SUBCLAVIAN INNOMINATE UNI R MOD SED  11/20/2020  ? IR CT HEAD LTD  11/20/2020  ? IR PERCUTANEOUS ART THROMBECTOMY/INFUSION INTRACRANIAL INC DIAG ANGIO  11/20/2020  ? LAPAROSCOPIC RIGHT COLECTOMY  09/11/2019  ? Procedure: RIGHT COLECTOMY;  Surgeon: Herbert Pun, MD;  Location: [facility] ORS;  Service: General;;  ? LAPAROSCOPY N/A 09/11/2019  ? Procedure: LAPAROSCOPY DIAGNOSTIC;  Surgeon: Herbert Pun, MD;  Location: [facility] ORS;  Service: General;  Laterality: N/A;  ? LAPAROTOMY N/A 09/13/2019  ? Procedure: REOPENING OF RECENT LAPAROTOMYANASTOMOSIS OF BOWEL;  Surgeon: Herbert Pun, MD;  Location: [facility] ORS;  Service: General;  Laterality: N/A;  ? RADIOLOGY WITH ANESTHESIA N/A 11/20/2020  ? Procedure: IR WITH ANESTHESIA - CODE STROKE;  Surgeon: Radiologist, Medication, MD;  Location: [facility];  Service: Radiology;  Laterality: N/A;  ? RIGHT/LEFT HEART CATH AND CORONARY ANGIOGRAPHY Bilateral 02/27/2020  ? Procedure: RIGHT/LEFT HEART CATH AND CORONARY ANGIOGRAPHY;  Surgeon: Wellington Hampshire, MD;  Location: [facility] CV LAB;  Service: Cardiovascular;  Laterality: Bilateral;  ? VISCERAL ANGIOGRAPHY N/A 09/12/2019  ? Procedure: VISCERAL ANGIOGRAPHY;  Surgeon: Algernon Huxley, MD;  Location: [facility] CV LAB;  Service:  Cardiovascular;  Laterality: N/A;  ? ? ?Medical History: ?Past Medical History:  ?Diagnosis Date  ? Asthma   ? Chronic atrial fibrillation (HCC)   ? a. diagnosed in 09/2016; b. failed flecainide and propafenone due to LE swelling and SOB, could not afford Multaq; c. CHADS2VASc => 5 (CHF, HTN, age x 1, nonobs CAD, female)--> Eliquis  ? COPD (chronic obstructive pulmonary disease) ([place])   ? GERD (gastroesophageal reflux disease)   ? HFmrEF (heart failure with mid-range ejection fraction) ([place])   ? a. 12/2019 Echo: EF 40-45%.  ? Hyperlipidemia   ? Hypertension   ? NICM (nonischemic cardiomyopathy) ([place])   ? a. 12/2019 Echo: EF 40-45%, glob HK, mildly reduced RV fxn, sev dil LA. *HR 130 (afib) during study.  ? Nonobstructive CAD (coronary artery disease)   ? a. Lexiscan Myoview 10/2016: no evidence of ischemia, EF 53%; b. 02/2020 Cath: LM nl, LAD 20p, 65m LCX 20ost, OM1/2/3 nl, LPDA nl, LPL1/2 nl, LPAV nl, RCA small, nl.  ? Obesity   ?  Obstructive sleep apnea   ? Pulmonary hypertension ([place])   ? Sleep apnea   ? Stroke [facility])   ? Systolic dysfunction   ? a. TTE 10/2016: EF 50%, mild LVH, moderately dilated LA, moderate MR/TR, mild pulmonary hypertension  ? ? ?Family History: ?Family History  ?Problem Relation Age of Onset  ? Dementia Mother   ? Osteoporosis Mother   ? Vascular Disease Mother   ? COPD Father   ? Heart disease Brother   ? Cancer Daughter   ? ? ?Social History  ? ?Socioeconomic History  ? Marital status: Married  ?  Spouse name: [name]   ? Number of children: 2  ? Years of education: Not on file  ? Highest education level: Not on file  ?Occupational History  ? Occupation: retired   ?  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 Smoking status: Never  ? Smokeless tobacco: Never  ?Vaping Use  ? Vaping Use: Never used  ?Substance and Sexual Activity  ? Alcohol use: No  ? Drug use: No  ? Sexual activity: Not on file  ?Other Topics Concern  ? Not on file  ?Social  History Narrative  ? Her grand daughter, [name] and Brittany's family moved in with her and husband, [name] to assist in their care  ? Daughter [name] assists with her care also  ? 3 grandchildren: 7 g

## 2022-04-17 ENCOUNTER — Ambulatory Visit: Payer: Medicare Other | Admitting: Cardiovascular Disease

## 2022-04-24 ENCOUNTER — Ambulatory Visit: Payer: Medicare Other | Admitting: Internal Medicine

## 2022-05-01 ENCOUNTER — Telehealth: Payer: Self-pay

## 2022-05-01 DIAGNOSIS — I4891 Unspecified atrial fibrillation: Secondary | ICD-10-CM | POA: Diagnosis not present

## 2022-05-01 DIAGNOSIS — J189 Pneumonia, unspecified organism: Secondary | ICD-10-CM | POA: Diagnosis not present

## 2022-05-01 DIAGNOSIS — R42 Dizziness and giddiness: Secondary | ICD-10-CM | POA: Diagnosis not present

## 2022-05-01 DIAGNOSIS — R059 Cough, unspecified: Secondary | ICD-10-CM | POA: Diagnosis not present

## 2022-05-01 DIAGNOSIS — R110 Nausea: Secondary | ICD-10-CM | POA: Diagnosis not present

## 2022-05-01 DIAGNOSIS — I509 Heart failure, unspecified: Secondary | ICD-10-CM | POA: Diagnosis not present

## 2022-05-01 DIAGNOSIS — R531 Weakness: Secondary | ICD-10-CM | POA: Diagnosis not present

## 2022-05-01 DIAGNOSIS — G4733 Obstructive sleep apnea (adult) (pediatric): Secondary | ICD-10-CM | POA: Diagnosis not present

## 2022-05-01 DIAGNOSIS — J449 Chronic obstructive pulmonary disease, unspecified: Secondary | ICD-10-CM | POA: Diagnosis not present

## 2022-05-01 DIAGNOSIS — M47812 Spondylosis without myelopathy or radiculopathy, cervical region: Secondary | ICD-10-CM | POA: Diagnosis not present

## 2022-05-01 DIAGNOSIS — Z043 Encounter for examination and observation following other accident: Secondary | ICD-10-CM | POA: Diagnosis not present

## 2022-05-01 DIAGNOSIS — R600 Localized edema: Secondary | ICD-10-CM | POA: Diagnosis not present

## 2022-05-01 DIAGNOSIS — I110 Hypertensive heart disease with heart failure: Secondary | ICD-10-CM | POA: Diagnosis not present

## 2022-05-01 DIAGNOSIS — R519 Headache, unspecified: Secondary | ICD-10-CM | POA: Diagnosis not present

## 2022-05-01 DIAGNOSIS — I739 Peripheral vascular disease, unspecified: Secondary | ICD-10-CM | POA: Diagnosis not present

## 2022-05-01 DIAGNOSIS — R0602 Shortness of breath: Secondary | ICD-10-CM | POA: Diagnosis not present

## 2022-05-01 DIAGNOSIS — S0990XA Unspecified injury of head, initial encounter: Secondary | ICD-10-CM | POA: Diagnosis not present

## 2022-05-01 DIAGNOSIS — Z9989 Dependence on other enabling machines and devices: Secondary | ICD-10-CM | POA: Diagnosis not present

## 2022-05-01 DIAGNOSIS — R079 Chest pain, unspecified: Secondary | ICD-10-CM | POA: Diagnosis not present

## 2022-05-01 NOTE — Telephone Encounter (Signed)
Pt called stating she was having SOB, a cough for 3 to 4 days, she has been using her oxygen during the day for the last 2 days, and she has been dizzy for 2 days. She said she has no energy and is exhausted. Pt stated she fell and hit her head, left side, cheek bone and eyebrow area. Pt said she did not go to the hospital when she fell. She said her vitals today are: BP-116/66 pulse-66 oxygen 98 with 2.5L oxygen. Spoke with Alyssa, she wants pt to make appt, called pt back and she did not answer. Pt also mentioned grapefruit juice possibly interacting with her medications and she had questions. Per Alyssa pt should not drink grapefruit juice if on a statin.  ?

## 2022-05-01 NOTE — Telephone Encounter (Signed)
Pt called back at 424 pm and was checking about an appt again and advised she is struggling bad to breath.  Per Alyssa she feels pt needs to go to hospital due to she could be having a reaction to the grapefruit juice she drank.  Pt advised that she will probably go to the [facility] in [place] and she will let us know what goes on ? ?

## 2022-05-02 ENCOUNTER — Telehealth: Payer: Self-pay

## 2022-05-02 DIAGNOSIS — J189 Pneumonia, unspecified organism: Secondary | ICD-10-CM | POA: Diagnosis not present

## 2022-05-02 NOTE — Telephone Encounter (Signed)
Attempted to contact patient to schedule ED follow up appointment. No answer, her mailbox is full. I lvm with husband, Donald-Toni ?

## 2022-05-06 ENCOUNTER — Ambulatory Visit: Payer: Medicare Other | Admitting: Nurse Practitioner

## 2022-05-09 ENCOUNTER — Encounter: Payer: Self-pay | Admitting: Nurse Practitioner

## 2022-05-09 ENCOUNTER — Ambulatory Visit (INDEPENDENT_AMBULATORY_CARE_PROVIDER_SITE_OTHER): Payer: Medicare Other | Admitting: Nurse Practitioner

## 2022-05-09 VITALS — BP 117/84 | HR 70 | Temp 98.1°F | Resp 16 | Ht 64.0 in | Wt 244.8 lb

## 2022-05-09 DIAGNOSIS — I5022 Chronic systolic (congestive) heart failure: Secondary | ICD-10-CM

## 2022-05-09 DIAGNOSIS — J42 Unspecified chronic bronchitis: Secondary | ICD-10-CM | POA: Diagnosis not present

## 2022-05-09 DIAGNOSIS — J189 Pneumonia, unspecified organism: Secondary | ICD-10-CM | POA: Diagnosis not present

## 2022-05-09 DIAGNOSIS — E119 Type 2 diabetes mellitus without complications: Secondary | ICD-10-CM

## 2022-05-09 DIAGNOSIS — J209 Acute bronchitis, unspecified: Secondary | ICD-10-CM | POA: Diagnosis not present
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
## 2022-05-13 ENCOUNTER — Ambulatory Visit: Payer: Medicare Other | Admitting: Nurse Practitioner

## 2022-05-13 ENCOUNTER — Other Ambulatory Visit: Payer: Self-pay

## 2022-05-13 ENCOUNTER — Other Ambulatory Visit: Payer: Self-pay | Admitting: Internal Medicine

## 2022-05-13 DIAGNOSIS — J209 Acute bronchitis, unspecified: Secondary | ICD-10-CM

## 2022-05-13 MED ORDER — PREDNISONE 10 MG PO TABS: ORAL_TABLET | ORAL | 0 refills | Status: DC

## 2022-05-14 ENCOUNTER — Encounter: Payer: Self-pay | Admitting: Internal Medicine

## 2022-05-15 NOTE — Progress Notes (Unsigned)
Office Visit    Patient Name: Laura [name] Date of Encounter: 05/16/2022  Primary Care Provider:  Jonetta Osgood, NP Primary Cardiologist:  Kathlyn Sacramento, MD Primary Electrophysiologist: None  Chief Complaint    Laura [name] is a 71 y.o. female with PMH of CAD, chronic AF, obesity, OSA on CPAP, asthma ( on chronic Prednisone), HTN, HFrEF, NICM, HLD, nonobstructive CAD, GERD present today for 3 month follow up for HFrEF.  Past Medical History    Past Medical History:  Diagnosis Date   Asthma    Chronic atrial fibrillation ([place])    a. diagnosed in 09/2016; b. failed flecainide and propafenone due to LE swelling and SOB, could not afford Multaq; c. CHADS2VASc => 5 (CHF, HTN, age x 1, nonobs CAD, female)--> Eliquis   COPD (chronic obstructive pulmonary disease) (HCC)    GERD (gastroesophageal reflux disease)    HFmrEF (heart failure with mid-range ejection fraction) ([place])    a. 12/2019 Echo: EF 40-45%.   Hyperlipidemia    Hypertension    NICM (nonischemic cardiomyopathy) ([place])    a. 12/2019 Echo: EF 40-45%, glob HK, mildly reduced RV fxn, sev dil LA. *HR 130 (afib) during study.   Nonobstructive CAD (coronary artery disease)    a. Lexiscan Myoview 10/2016: no evidence of ischemia, EF 53%; b. 02/2020 Cath: LM nl, LAD 20p, 67m LCX 20ost, OM1/2/3 nl, LPDA nl, LPL1/2 nl, LPAV nl, RCA small, nl.   Obesity    Obstructive sleep apnea    Pulmonary hypertension ([place]    Sleep apnea    Stroke ([place]    Systolic dysfunction    a. TTE 10/2016: EF 50%, mild LVH, moderately dilated LA, moderate MR/TR, mild pulmonary hypertension   Past Surgical History:  Procedure Laterality Date   BOWEL RESECTION  09/11/2019   Procedure: SMALL BOWEL RESECTION;  Surgeon: CHerbert Pun MD;  Location: [facility];  Service: General;;   CARDIAC CATHETERIZATION     cataract surgery     COLONOSCOPY WITH PROPOFOL N/A 03/19/2020   Procedure: COLONOSCOPY WITH PROPOFOL;  Surgeon: AJonathon Bellows MD;   [facility];  Service: Gastroenterology;  Laterality: N/A;   CORONARY ANGIOPLASTY     INCISION AND DRAINAGE ABSCESS Right 06/29/2016   Procedure: INCISION AND DRAINAGE ABSCESS;  Surgeon: RFlorene Glen MD;  Location: [facility];  Service: General;  Laterality: Right;   INCISION AND DRAINAGE OF WOUND Left 06/29/2016   Procedure: IRRIGATION AND DEBRIDEMENT WOUND;  Surgeon: RFlorene Glen MD;  Location: [facility];  Service: General;  Laterality: Left;   IR ANGIO VERTEBRAL SEL SUBCLAVIAN INNOMINATE UNI R MOD SED  11/20/2020   IR CT HEAD LTD  11/20/2020   IR PERCUTANEOUS ART THROMBECTOMY/INFUSION INTRACRANIAL INC DIAG ANGIO  11/20/2020   LAPAROSCOPIC RIGHT COLECTOMY  09/11/2019   Procedure: RIGHT COLECTOMY;  Surgeon: CHerbert Pun MD;  Location: [facility];  Service: General;;   LAPAROSCOPY N/A 09/11/2019   Procedure: LAPAROSCOPY DIAGNOSTIC;  Surgeon: CHerbert Pun MD;  Location: [facility];  Service: General;  Laterality: N/A;   LAPAROTOMY N/A 09/13/2019   Procedure: REOPENING OF RECENT LAPAROTOMYANASTOMOSIS OF BOWEL;  Surgeon: CHerbert Pun MD;  Location: [facility];  Service: General;  Laterality: N/A;   RADIOLOGY WITH ANESTHESIA N/A 11/20/2020   Procedure: IR WITH ANESTHESIA - CODE STROKE;  Surgeon: Radiologist, Medication, MD;  [place]  Service: Radiology;  Laterality: N/A;   RIGHT/LEFT HEART CATH AND CORONARY ANGIOGRAPHY Bilateral 02/27/2020   Procedure: RIGHT/LEFT HEART CATH AND CORONARY ANGIOGRAPHY;  Surgeon: Wellington Hampshire, MD;  Location: [place] [facility];  Service: Cardiovascular;  Laterality: Bilateral;   VISCERAL ANGIOGRAPHY N/A 09/12/2019   Procedure: VISCERAL ANGIOGRAPHY;  Surgeon: Algernon Huxley, MD;  Location: [place] [facility];  Service: Cardiovascular;  Laterality: N/A;    Allergies  Allergies  Allergen Reactions   Flecainide Shortness Of Breath   Metoprolol Shortness Of Breath, Swelling and Other (See Comments)    "My limbs swell"  also   Propafenone Shortness Of Breath, Swelling and Other (See Comments)    "My limbs swell" also   Rivaroxaban Swelling and Other (See Comments)    Xarelto- "My limbs swell" Other reaction(s): Muscle Pain, Other (See Comments) Other reaction(s): Other (See Comments) Xarelto- "My limbs swell"    History of Present Illness    Laura [name] was diagnosed with atrial fibrillation in 2017. She previously attempted flecainide and propafenone for rate control however failed due to side effects and affordability. Rate controlled now with Cardizem, digoxin and Eliquis for [facility]. She has allergy to Xarelto.Patient had a nuclear stress test on 10/2016 that revealed no evidence of ischemia and EF of 35%, 2D echo 10/2016 also revealed mildly reduced EF of 50%, mild LVH, mildly dilated LA, moderate MR and TR with mild PHT.  She underwent ileum and right colon resection for ischemic colitis in 08/2019.  Laura [name] had TTE on 1/21 with EF of 40-45% and dilated IVC.  Chest CT completed 2/16 by pulmonology revealed incidental findings of PHT.  She was seen by Dr. Fletcher Anon who completed [facility] which revealed mild obstructive coronary artery disease, and PHT (wedge of 24 and PA of 43/24 (30) felt related to left-sided CHF.  She was placed on Lasix 20 mg daily.  She was seen in follow-up on 03/20/2020 and tolerating Lasix with normal renal function and electrolytes, lipid panel did reveal LDL of 103 and Crestor 20 mg was started.  On 06/01/2020 she was transition to warfarin due to cost related to Eliquis.  During clinic visit on 10/21 patient reported 3-day history of nausea and lightheadedness, she also reported chest pain when lying down following a meal.  During follow-up on 11/20 1 GI symptoms and dizziness were both improved and no changes were made to cardiac medications.  On 11/20/2020 patient presented to [facility] due to acute right MCA ischemic stroke that was noted to be embolic due to A-fib.  Stroke occurred in the  setting of holding Coumadin for lumbar injection.  2D echo on 12/21 with EF of 40-45%.  She was started back on Eliquis for [facility] and contracted COVID on 12/21 with for recovery.  She was seen by Dr. Mariea Clonts on 02/14/2021 and digoxin was discontinued due to renal dysfunction and adequate rate control and she was started on Cardizem.  She was seen in the ED on 05/2021 for increased headaches thought to be possible TIA and ASA was added to Eliquis.  She was seen in follow-up on 12/2021 by Dr.Arida with increased left arm and ankle edema and Cardizem was decreased to 240 mg daily and 2D echo was ordered that revealed EF 40-45%  Her losartan was switched to [employer].  Patient developed shortness of breath 2 weeks later that she felt may be related to [facility]. She was seen in the ED at [place] for shortness of breath, productive cough with white sputum, and nausea.  She had also had a recent fall due to dizziness and had CT of head performed with no acute  findings.  Her BNP was elevated at 133 and POCUS was reassuring against CHF exacerbation.   Since last being seen in our office Ms. Ruppe has continued to have difficulty with catching her breath.  She has also had another fall due to dizziness at home but did not suffer any head trauma.  I performed orthostatics on her in office that were negative however they were in the 660-630 systolic and her heart rate had increased from 95-98.  She is euvolemic on examination with exception of chronic lower extremity edema . She was recently started on Trelegy but states that she is still having difficulty breathing.  She denies any fevers or chills currently and states that her nausea and productive cough have subsided.  She is ambulating and uses a walker to move around but states she would like to feel better and hopes that her breathing improves soon with the Trelegy. Patient denies chest pain, palpitations, dyspnea, PND, orthopnea, nausea, vomiting, dizziness, syncope, edema,  weight gain, or early satiety.   Home Medications    Current Outpatient Medications  Medication Sig Dispense Refill   Accu-Chek Softclix Lancets lancets Accu-Chek Softclix Lancets  USE AS INSTRUCTED TO CHECK BLOOD SUGARS DAILY 2 HOURS AFTER MEAL     albuterol (VENTOLIN HFA) 108 (90 Base) MCG/ACT inhaler Inhale 2 puffs into the lungs every 4 (four) hours as needed for wheezing or shortness of breath. 18 g 3   ALPRAZolam (XANAX) 0.25 MG tablet Take 1 tablet (0.25 mg total) by mouth 2 (two) times daily as needed for anxiety. 30 tablet 1   benzonatate (TESSALON) 100 MG capsule Take 1 capsule (100 mg total) by mouth 2 (two) times daily as needed for cough. 30 capsule 2   diltiazem (CARDIZEM CD) 240 MG 24 hr capsule TAKE 1 CAPSULE BY MOUTH EVERY DAY 90 capsule 2   ELIQUIS 5 MG TABS tablet TAKE 1 TABLET BY MOUTH TWICE A DAY 60 tablet 5   EPINEPHrine 0.3 mg/0.3 mL IJ SOAJ injection Inject 0.3 mg into the muscle as needed for anaphylaxis.      fluticasone (FLONASE) 50 MCG/ACT nasal spray Place into both nostrils daily.     Fluticasone-Umeclidin-Vilant (TRELEGY ELLIPTA IN) Inhale 1 puff into the lungs daily.     furosemide (LASIX) 20 MG tablet Take 1 tablet (20 mg total) by mouth daily.     gentamicin cream (GARAMYCIN) 0.1 % Apply 1 application topically 2 (two) times daily. 30 g 1   glucose blood (ACCU-CHEK GUIDE) test strip Use as instructed to check blood sugars daily 2 hours after meal DX E11.65 100 each 12   ipratropium-albuterol (DUONEB) 0.5-2.5 (3) MG/3ML SOLN USE 1 VIAL IN NEBULIZER EVERY 6 HOURS - and as needed 120 mL 11   loperamide (IMODIUM) 2 MG capsule Take 4 mg by mouth as needed for diarrhea or loose stools.     methocarbamol (ROBAXIN) 500 MG tablet Take 1 tablet by mouth 3 (three) times daily as needed.     montelukast (SINGULAIR) 10 MG tablet TAKE 1 TABLET BY MOUTH EVERY DAY 90 tablet 1   omeprazole (PRILOSEC) 40 MG capsule Take 1 capsule (40 mg total) by mouth daily. 90 capsule 3    ondansetron (ZOFRAN) 4 MG tablet Take 1 tablet (4 mg total) by mouth every 8 (eight) hours as needed for nausea or vomiting. 60 tablet 1   predniSONE (DELTASONE) 10 MG tablet TAKE 1 TABLET (10 MG TOTAL) BY MOUTH DAILY WITH BREAKFAST. 90 tablet 1  predniSONE (DELTASONE) 10 MG tablet covid pt's (DFK) take one tab 3 X day for 3 days, then take one tab 2 X a day for 3 days and then take one tab a day for 3 days for COPD   18 tabs 18 tablet 0   Probiotic Product (PROBIOTIC-10) CAPS Take 1 capsule by mouth daily.      promethazine (PHENERGAN) 12.5 MG tablet TAKE 1 TABLET (12.5 MG TOTAL) BY MOUTH EVERY 12 (TWELVE) HOURS AS NEEDED FOR NAUSEA OR VOMITING. 30 tablet 0   rosuvastatin (CRESTOR) 20 MG tablet TAKE 1 TABLET BY MOUTH EVERY DAY 90 tablet 3   sacubitril-valsartan (ENTRESTO) 24-26 MG Take 1 tablet by mouth 2 (two) times daily. 60 tablet 5   spironolactone (ALDACTONE) 25 MG tablet Take 0.5 tablets (12.5 mg total) by mouth daily. 45 tablet 3   traMADol (ULTRAM) 50 MG tablet Take 50 mg by mouth 4 (four) times daily as needed.     No current facility-administered medications for this visit.     Review of Systems  Please see the history of present illness.    (+) Chronic shortness of breath (+) Wheezing with inhalation  All other systems reviewed and are otherwise negative except as noted above.  Physical Exam    Wt Readings from Last 3 Encounters:  05/16/22 243 lb (110.2 kg)  05/09/22 244 lb 12.8 oz (111 kg)  04/04/22 241 lb (109.3 kg)   VS: Vitals:   05/16/22 1449  BP: 118/68  Pulse: (!) 58  SpO2: 98%  ,Body mass index is 41.71 kg/m.  Orthostatic VS for the past 72 hrs (Last 3 readings):  Orthostatic BP Patient Position BP Location Cuff Size Orthostatic Pulse  05/16/22 1610 118/68 Standing Right Arm Normal 98  05/16/22 1609 112/60 Sitting Right Arm Normal 94  05/16/22 1608 115/64 Supine Right Arm Normal 95    No data found.   Constitutional:      Appearance: Obese female.  Not in distress.  Neck:     Vascular: JVD normal.  Pulmonary:     Effort: Pulmonary effort is normal.     Breath sounds: Inspiratory wheezing. No rales. Diminished in the bases Cardiovascular:     Normal rate. Regular rhythm. Normal S1. Normal S2.      Murmurs: There is no murmur.  Edema:    Peripheral edema chronic in left lower extremity absent.  Abdominal:     Palpations: Abdomen is soft non tender. There is no hepatomegaly.  Skin:    General: Skin is warm and dry.  Neurological:     General: No focal deficit present.     Mental Status: Alert and oriented to person, place and time.     Cranial Nerves: Cranial nerves are intact.  EKG/LABS/Other Studies Reviewed    ECG personally reviewed by me today -none completed for this examination  Lab Results  Component Value Date   WBC 13.9 (H) 01/03/2022   HGB 15.1 (H) 01/03/2022   HCT 43.8 01/03/2022   MCV 94.2 01/03/2022   PLT 296 01/03/2022   Lab Results  Component Value Date   CREATININE 1.36 (H) 02/20/2022   BUN 24 02/20/2022   NA 141 02/20/2022   K 4.8 02/20/2022   CL 102 02/20/2022   CO2 22 02/20/2022   Lab Results  Component Value Date   ALT 23 01/03/2022   AST 19 01/03/2022   ALKPHOS 46 01/03/2022   BILITOT 1.1 01/03/2022   Lab Results  Component Value Date  CHOL 143 06/12/2021   HDL 73 06/12/2021   LDLCALC 47 06/12/2021   TRIG 140 06/12/2021   CHOLHDL 2.0 06/12/2021    Lab Results  Component Value Date   HGBA1C 6.5 (A) 11/11/2021    Assessment & Plan    1.  HFrEF: -2D echo completed 01/2021 with EF of 40-45% with moderate MR and small ASD with left-to-right shunt.  She was recently started on Entresto and is tolerating it without any significant hypotension.  She does endorse some dizziness with standing and ambulation. -Orthostatic blood pressures were obtained that were negative -We will decrease her spironolactone to 12.5 mg daily to see if this helps with dizziness and orthostasis -Continue  Entresto 24/26 mg twice daily -BMET and BNP today -If patient's renal function is still elevated we may move Lasix 20 mg to every other day but if improved will remain at current dose. -Low sodium diet, fluid restriction <2L, and daily weights encouraged. Educated to contact our office for weight gain of 2 lbs overnight or 5 lbs in one week.   2.  Atrial fibrillation: -Patient currently rate controlled with heart rate of 58 on Cardizem 2040 mg daily -Continue Eliquis 5 mg twice daily -Patient not in parameters for dose reduction at this time  3.  Essential hypertension: -Blood pressure well controlled today at 118/68. -Continue current antihypertensive regimen  4.  Hyperlipidemia: -Most recent LDL was 87 on 05/2021 -Continue Crestor 20 mg daily  5.  OSA: -Patient compliant with CPAP currently  Disposition: Follow-up with Kathlyn Sacramento, MD as scheduled    Medication Adjustments/Labs and Tests Ordered: Current medicines are reviewed at length with the patient today.  Concerns regarding medicines are outlined above.   Signed, Mable Fill, Marissa Nestle, NP 05/16/2022, 4:08 PM [place]

## 2022-05-16 ENCOUNTER — Encounter: Payer: Self-pay | Admitting: Nurse Practitioner

## 2022-05-16 ENCOUNTER — Other Ambulatory Visit
Admission: RE | Admit: 2022-05-16 | Discharge: 2022-05-16 | Disposition: A | Discharge status: Home / Self Care | Payer: Medicare Other | Source: Ambulatory Visit | Attending: Nurse Practitioner | Admitting: Nurse Practitioner

## 2022-05-16 ENCOUNTER — Ambulatory Visit (INDEPENDENT_AMBULATORY_CARE_PROVIDER_SITE_OTHER): Payer: Medicare Other | Admitting: Nurse Practitioner

## 2022-05-16 VITALS — BP 118/68 | HR 58 | Ht 64.0 in | Wt 243.0 lb

## 2022-05-16 DIAGNOSIS — I5020 Unspecified systolic (congestive) heart failure: Secondary | ICD-10-CM

## 2022-05-16 DIAGNOSIS — E785 Hyperlipidemia, unspecified: Secondary | ICD-10-CM | POA: Diagnosis not present

## 2022-05-16 DIAGNOSIS — I639 Cerebral infarction, unspecified: Secondary | ICD-10-CM

## 2022-05-16 DIAGNOSIS — I5022 Chronic systolic (congestive) heart failure: Secondary | ICD-10-CM | POA: Diagnosis not present

## 2022-05-16 DIAGNOSIS — I10 Essential (primary) hypertension: Secondary | ICD-10-CM | POA: Diagnosis not present

## 2022-05-16 DIAGNOSIS — I110 Hypertensive heart disease with heart failure: Secondary | ICD-10-CM | POA: Insufficient documentation

## 2022-05-16 DIAGNOSIS — I4891 Unspecified atrial fibrillation: Secondary | ICD-10-CM | POA: Insufficient documentation

## 2022-05-16 DIAGNOSIS — I25118 Atherosclerotic heart disease of native coronary artery with other forms of angina pectoris: Secondary | ICD-10-CM | POA: Diagnosis not present

## 2022-05-16 DIAGNOSIS — I2510 Atherosclerotic heart disease of native coronary artery without angina pectoris: Secondary | ICD-10-CM

## 2022-05-16 LAB — BASIC METABOLIC PANEL
Anion gap: 10 (ref 5–15)
BUN: 23 mg/dL (ref 8–23)
CO2: 22 mmol/L (ref 22–32)
Calcium: 9.6 mg/dL (ref 8.9–10.3)
Chloride: 106 mmol/L (ref 98–111)
Creatinine, Ser: 1.09 mg/dL — ABNORMAL HIGH (ref 0.44–1.00)
GFR, Estimated: 54 mL/min — ABNORMAL LOW (ref >60)
Glucose, Bld: 168 mg/dL — ABNORMAL HIGH (ref 70–99)
Potassium: 4.4 mmol/L (ref 3.5–5.1)
Sodium: 138 mmol/L (ref 135–145)

## 2022-05-16 LAB — BRAIN NATRIURETIC PEPTIDE: B Natriuretic Peptide: 227.5 pg/mL — ABNORMAL HIGH (ref 0.0–100.0)

## 2022-05-16 MED ORDER — SPIRONOLACTONE 25 MG PO TABS: 12.5000 mg | ORAL_TABLET | Freq: Every day | ORAL | 3 refills | Status: DC

## 2022-05-16 NOTE — Patient Instructions (Addendum)
Medication Instructions:  Your physician has recommended you make the following change in your medication:   DECREASE Spironolactone 12.5 mg once daily    *If you need a refill on your cardiac medications before your next appointment, please call your pharmacy*   Lab Work: BMET & BNP today over at the [employer] at [facility] then go to 1st desk on the right to check in (REGISTRATION)    If you have labs (blood work) drawn today and your tests are completely normal, you will receive your results only by: [place] (if you have [place]) OR A paper copy in the mail If you have any lab test that is abnormal or we need to change your treatment, we will call you to review the results.   Testing/Procedures: None   Follow-Up: At [facility], you and your health needs are our priority.  As part of our continuing mission to provide you with exceptional heart care, we have created designated Provider Care Teams.  These Care Teams include your primary Cardiologist (physician) and Advanced Practice Providers (APPs -  Physician Assistants and Nurse Practitioners) who all work together to provide you with the care you need, when you need it.   Your next appointment:   August 19, 2022 at 3:20 pm with Dr. Fletcher Anon  The format for your next appointment:   In Person  Provider:   Kathlyn Sacramento, MD         Important Information About Sugar

## 2022-05-19 MED ORDER — PREDNISONE 10 MG PO TABS: ORAL_TABLET | ORAL | 0 refills | Status: DC

## 2022-05-19 NOTE — Telephone Encounter (Signed)
Laura [name], can you call [name] tomorrow about the oxygen concentrator and see what needs to be done?

## 2022-05-22 DIAGNOSIS — S46011D Strain of muscle(s) and tendon(s) of the rotator cuff of right shoulder, subsequent encounter: Secondary | ICD-10-CM | POA: Diagnosis not present

## 2022-05-22 DIAGNOSIS — M47896 Other spondylosis, lumbar region: Secondary | ICD-10-CM | POA: Diagnosis not present

## 2022-05-22 DIAGNOSIS — M533 Sacrococcygeal disorders, not elsewhere classified: Secondary | ICD-10-CM | POA: Diagnosis not present

## 2022-05-22 DIAGNOSIS — M47816 Spondylosis without myelopathy or radiculopathy, lumbar region: Secondary | ICD-10-CM | POA: Diagnosis not present

## 2022-05-22 DIAGNOSIS — M545 Low back pain, unspecified: Secondary | ICD-10-CM | POA: Diagnosis not present

## 2022-05-23 ENCOUNTER — Encounter: Payer: Self-pay | Admitting: Nurse Practitioner

## 2022-05-24 ENCOUNTER — Other Ambulatory Visit: Payer: Self-pay | Admitting: Cardiovascular Disease

## 2022-06-09 ENCOUNTER — Telehealth: Payer: Self-pay | Admitting: Cardiovascular Disease

## 2022-06-09 DIAGNOSIS — Z79899 Other long term (current) drug therapy: Secondary | ICD-10-CM

## 2022-06-09 DIAGNOSIS — I5020 Unspecified systolic (congestive) heart failure: Secondary | ICD-10-CM

## 2022-06-09 DIAGNOSIS — R600 Localized edema: Secondary | ICD-10-CM

## 2022-06-09 NOTE — Telephone Encounter (Signed)
I spoke with the patient. She advised she missed weighing for a few days in a row last week. However, on Friday, she did weigh due to increased swelling to her lower extremities/ abdomen/ increased SOB.  Her dry weight is running ~ 241-242 lbs  6/16- 250.5 lbs 6/17- 247 lbs Today- 246 lbs  She does normally take: - Lasix 20 mg once daily - spironolactone 12.5 mg once daily - entresto 24/26 mg BID  She reports she did take an extra lasix on Friday for a total of 40 mg that day. She has not taken any extra lasix since then. She advised that her right leg swelling has resolved. She does still have left leg swelling >> she has been using a compression sock today to see if this will help, but she also notices pain in the left calf area with walking today. She denies any reddness/ temperature change to her LLE.   BP today is 116/66.  BNP- 05/16/22- 227.5 EF- 01/29/22- 40-45% She is on long term prednisone per her medication list (did not confirm with the patient while on the phone with her).   I have advised the patient: - to go ahead and take an additional lasix 20 mg x 1 dose now - continue to monitor her weight (she is carefully watching her fluid intake)  She is aware I will forward a message to Dr. Fletcher Anon for further recommendations.  I am unsure if he would want to obtain a venous ultrasound of her LLE, although her pain may be coming from fluid as well. She is scheduled to go out of town on Friday. She is also scheduled for ABI's with [facility] on 06/30/22.  The patient voices understanding of the above recommendations and is agreeable.

## 2022-06-09 NOTE — Telephone Encounter (Signed)
Pt c/o swelling: STAT is pt has developed SOB within 24 hours  If swelling, where is the swelling located? legs  How much weight have you gained and in what time span? 7lbs in 4 days   Have you gained 3 pounds in a day or 5 pounds in a week? no  Do you have a log of your daily weights (if so, list)?  250 247 246 Are you currently taking a fluid pill? Yes   Are you currently SOB? Pt states a little bit  Have you traveled recently? No   Pt states that she has been having swelling in her legs. Friday morning (06/06/22) she stated that she took an extra fluid pill. She wants to know if she should keep taking this medication   spironolactone (ALDACTONE) 25 MG tablet

## 2022-06-10 NOTE — Telephone Encounter (Signed)
I spoke with the patient. Confirmed that her weight today is 243 lbs/  BP- 116/66. She also advised her left LLE is somewhat improved with compression and elevation as well.   I advised the patient of Dr. Tyrell Antonio recommendations to: 1) Take lasix 40 mg x 3 days 2) Check a BMP on Friday  Per the patient, she will be leaving town in Friday morning. She did take lasix 40 mg yesterday. She took lasix 20 mg this morning. I have advised her that since it is a little later in the day, she can: - take lasix 40 mg tomorrow & Thursday - repeat a BMP on Thursday prior to leaving town  The patient voices understanding and is agreeable.  She states she will have her BMP on Thursday afternoon at the [facility].  She will continue to weigh/ wear compression/ elevate her LE.   She was very appreciative of the call back.

## 2022-06-10 NOTE — Telephone Encounter (Signed)
Increase Lasix to 40 mg once daily for 3 days.  Check basic metabolic profile on Friday.

## 2022-06-12 ENCOUNTER — Other Ambulatory Visit
Admission: RE | Admit: 2022-06-12 | Discharge: 2022-06-12 | Disposition: A | Discharge status: Home / Self Care | Payer: Medicare Other | Attending: Cardiovascular Disease | Admitting: Cardiovascular Disease

## 2022-06-12 DIAGNOSIS — Z79899 Other long term (current) drug therapy: Secondary | ICD-10-CM | POA: Insufficient documentation

## 2022-06-12 DIAGNOSIS — R600 Localized edema: Secondary | ICD-10-CM | POA: Insufficient documentation

## 2022-06-12 DIAGNOSIS — I5020 Unspecified systolic (congestive) heart failure: Secondary | ICD-10-CM | POA: Insufficient documentation

## 2022-06-12 LAB — BASIC METABOLIC PANEL
Anion gap: 10 (ref 5–15)
BUN: 28 mg/dL — ABNORMAL HIGH (ref 8–23)
CO2: 21 mmol/L — ABNORMAL LOW (ref 22–32)
Calcium: 9.4 mg/dL (ref 8.9–10.3)
Chloride: 106 mmol/L (ref 98–111)
Creatinine, Ser: 1.31 mg/dL — ABNORMAL HIGH (ref 0.44–1.00)
GFR, Estimated: 43 mL/min — ABNORMAL LOW (ref >60)
Glucose, Bld: 223 mg/dL — ABNORMAL HIGH (ref 70–99)
Potassium: 4.1 mmol/L (ref 3.5–5.1)
Sodium: 137 mmol/L (ref 135–145)

## 2022-06-21 DIAGNOSIS — Z8673 Personal history of transient ischemic attack (TIA), and cerebral infarction without residual deficits: Secondary | ICD-10-CM | POA: Diagnosis not present

## 2022-06-21 DIAGNOSIS — I4891 Unspecified atrial fibrillation: Secondary | ICD-10-CM | POA: Diagnosis not present

## 2022-06-21 DIAGNOSIS — L299 Pruritus, unspecified: Secondary | ICD-10-CM | POA: Diagnosis not present

## 2022-06-21 DIAGNOSIS — R079 Chest pain, unspecified: Secondary | ICD-10-CM | POA: Diagnosis not present

## 2022-06-21 DIAGNOSIS — R110 Nausea: Secondary | ICD-10-CM | POA: Diagnosis not present

## 2022-06-21 DIAGNOSIS — R0600 Dyspnea, unspecified: Secondary | ICD-10-CM | POA: Diagnosis not present

## 2022-06-21 DIAGNOSIS — M79662 Pain in left lower leg: Secondary | ICD-10-CM | POA: Diagnosis not present

## 2022-06-21 DIAGNOSIS — M7989 Other specified soft tissue disorders: Secondary | ICD-10-CM | POA: Diagnosis not present

## 2022-06-21 DIAGNOSIS — Z7901 Long term (current) use of anticoagulants: Secondary | ICD-10-CM | POA: Diagnosis not present

## 2022-06-21 DIAGNOSIS — Z20822 Contact with and (suspected) exposure to covid-19: Secondary | ICD-10-CM | POA: Diagnosis not present

## 2022-06-21 DIAGNOSIS — R200 Anesthesia of skin: Secondary | ICD-10-CM | POA: Diagnosis not present

## 2022-06-21 DIAGNOSIS — R2242 Localized swelling, mass and lump, left lower limb: Secondary | ICD-10-CM | POA: Diagnosis not present

## 2022-06-21 DIAGNOSIS — R197 Diarrhea, unspecified: Secondary | ICD-10-CM | POA: Diagnosis not present

## 2022-06-22 ENCOUNTER — Encounter: Payer: Self-pay | Admitting: Nurse Practitioner

## 2022-06-22 DIAGNOSIS — M7989 Other specified soft tissue disorders: Secondary | ICD-10-CM | POA: Diagnosis not present

## 2022-06-24 ENCOUNTER — Other Ambulatory Visit: Payer: Self-pay | Admitting: Nurse Practitioner

## 2022-06-24 DIAGNOSIS — J9611 Chronic respiratory failure with hypoxia: Secondary | ICD-10-CM

## 2022-06-26 ENCOUNTER — Ambulatory Visit (INDEPENDENT_AMBULATORY_CARE_PROVIDER_SITE_OTHER): Payer: Medicare Other | Admitting: Physician Assistant

## 2022-06-26 DIAGNOSIS — E538 Deficiency of other specified B group vitamins: Secondary | ICD-10-CM

## 2022-06-26 DIAGNOSIS — R300 Dysuria: Secondary | ICD-10-CM

## 2022-06-26 DIAGNOSIS — I5022 Chronic systolic (congestive) heart failure: Secondary | ICD-10-CM

## 2022-06-26 DIAGNOSIS — R609 Edema, unspecified: Secondary | ICD-10-CM | POA: Diagnosis not present

## 2022-06-26 MED ORDER — CYANOCOBALAMIN 1000 MCG/ML IJ SOLN
1000.0000 ug | Freq: Once | INTRAMUSCULAR | Status: AC
  Administered 2022-06-26: 1000 ug via INTRAMUSCULAR

## 2022-06-26 MED ORDER — NITROFURANTOIN MONOHYD MACRO 100 MG PO CAPS: ORAL_CAPSULE | ORAL | 0 refills | Status: DC
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
## 2022-06-27 ENCOUNTER — Other Ambulatory Visit (INDEPENDENT_AMBULATORY_CARE_PROVIDER_SITE_OTHER): Payer: Self-pay | Admitting: Nurse Practitioner

## 2022-06-27 DIAGNOSIS — I739 Peripheral vascular disease, unspecified: Secondary | ICD-10-CM

## 2022-06-27 DIAGNOSIS — I6523 Occlusion and stenosis of bilateral carotid arteries: Secondary | ICD-10-CM

## 2022-06-30 ENCOUNTER — Ambulatory Visit (INDEPENDENT_AMBULATORY_CARE_PROVIDER_SITE_OTHER): Payer: Medicare Other | Admitting: Vascular Surgery

## 2022-06-30 ENCOUNTER — Encounter (INDEPENDENT_AMBULATORY_CARE_PROVIDER_SITE_OTHER): Payer: Self-pay | Admitting: Vascular Surgery

## 2022-06-30 ENCOUNTER — Ambulatory Visit (INDEPENDENT_AMBULATORY_CARE_PROVIDER_SITE_OTHER): Payer: Medicare Other

## 2022-06-30 VITALS — BP 124/68 | HR 91 | Resp 18 | Wt 249.6 lb

## 2022-06-30 DIAGNOSIS — I890 Lymphedema, not elsewhere classified: Secondary | ICD-10-CM | POA: Diagnosis not present

## 2022-06-30 DIAGNOSIS — I10 Essential (primary) hypertension: Secondary | ICD-10-CM

## 2022-06-30 DIAGNOSIS — I739 Peripheral vascular disease, unspecified: Secondary | ICD-10-CM | POA: Diagnosis not present

## 2022-06-30 DIAGNOSIS — I6523 Occlusion and stenosis of bilateral carotid arteries: Secondary | ICD-10-CM

## 2022-06-30 DIAGNOSIS — I482 Chronic atrial fibrillation, unspecified: Secondary | ICD-10-CM | POA: Diagnosis not present

## 2022-07-03 ENCOUNTER — Telehealth: Payer: Self-pay | Admitting: Physician Assistant

## 2022-07-03 ENCOUNTER — Ambulatory Visit (INDEPENDENT_AMBULATORY_CARE_PROVIDER_SITE_OTHER): Payer: Medicare Other | Admitting: Physician Assistant

## 2022-07-03 ENCOUNTER — Encounter: Payer: Self-pay | Admitting: Physician Assistant

## 2022-07-03 ENCOUNTER — Telehealth: Payer: Self-pay | Admitting: Cardiovascular Disease

## 2022-07-03 VITALS — BP 122/71 | HR 81 | Ht 64.0 in | Wt 252.0 lb

## 2022-07-03 DIAGNOSIS — I5020 Unspecified systolic (congestive) heart failure: Secondary | ICD-10-CM

## 2022-07-03 DIAGNOSIS — I428 Other cardiomyopathies: Secondary | ICD-10-CM | POA: Diagnosis not present

## 2022-07-03 DIAGNOSIS — I2510 Atherosclerotic heart disease of native coronary artery without angina pectoris: Secondary | ICD-10-CM | POA: Diagnosis not present

## 2022-07-03 DIAGNOSIS — E785 Hyperlipidemia, unspecified: Secondary | ICD-10-CM

## 2022-07-03 DIAGNOSIS — Z8673 Personal history of transient ischemic attack (TIA), and cerebral infarction without residual deficits: Secondary | ICD-10-CM | POA: Diagnosis not present

## 2022-07-03 DIAGNOSIS — I4821 Permanent atrial fibrillation: Secondary | ICD-10-CM | POA: Diagnosis not present

## 2022-07-03 MED ORDER — SPIRONOLACTONE 25 MG PO TABS: 25.0000 mg | ORAL_TABLET | Freq: Every day | ORAL | 3 refills | Status: DC

## 2022-07-03 MED ORDER — BISOPROLOL FUMARATE 5 MG PO TABS: 5.0000 mg | ORAL_TABLET | Freq: Every day | ORAL | 3 refills | Status: DC

## 2022-07-03 NOTE — Telephone Encounter (Signed)
  Patient Consent for Virtual Visit        Laura [name] has provided verbal consent on 07/03/2022 for a virtual visit (video or telephone).   CONSENT FOR VIRTUAL VISIT FOR:  Laura [name]  By participating in this virtual visit I agree to the following:  I hereby voluntarily request, consent and authorize [facility] and its employed or contracted physicians, physician assistants, nurse practitioners or other licensed health care professionals (the Practitioner), to provide me with telemedicine health care services (the "Services") as deemed necessary by the treating Practitioner. I acknowledge and consent to receive the Services by the Practitioner via telemedicine. I understand that the telemedicine visit will involve communicating with the Practitioner through live audiovisual communication technology and the disclosure of certain medical information by electronic transmission. I acknowledge that I have been given the opportunity to request an in-person assessment or other available alternative prior to the telemedicine visit and am voluntarily participating in the telemedicine visit.  I understand that I have the right to withhold or withdraw my consent to the use of telemedicine in the course of my care at any time, without affecting my right to future care or treatment, and that the Practitioner or I may terminate the telemedicine visit at any time. I understand that I have the right to inspect all information obtained and/or recorded in the course of the telemedicine visit and may receive copies of available information for a reasonable fee.  I understand that some of the potential risks of receiving the Services via telemedicine include:  Delay or interruption in medical evaluation due to technological equipment failure or disruption; Information transmitted may not be sufficient (e.g. poor resolution of images) to allow for appropriate medical decision making by the Practitioner;  and/or  In rare instances, security protocols could fail, causing a breach of personal health information.  Furthermore, I acknowledge that it is my responsibility to provide information about my medical history, conditions and care that is complete and accurate to the best of my ability. I acknowledge that Practitioner's advice, recommendations, and/or decision may be based on factors not within their control, such as incomplete or inaccurate data provided by me or distortions of diagnostic images or specimens that may result from electronic transmissions. I understand that the practice of medicine is not an exact science and that Practitioner makes no warranties or guarantees regarding treatment outcomes. I acknowledge that a copy of this consent can be made available to me via my patient portal ([facility]), or I can request a printed copy by calling the office of [facility].    I understand that my insurance will be billed for this visit.   I have read or had this consent read to me. I understand the contents of this consent, which adequately explains the benefits and risks of the Services being provided via telemedicine.  I have been provided ample opportunity to ask questions regarding this consent and the Services and have had my questions answered to my satisfaction. I give my informed consent for the services to be provided through the use of telemedicine in my medical care

## 2022-07-03 NOTE — Telephone Encounter (Signed)
Patient has appt at 10:55 am with Laura [name]. Her transportation fell through. She wants to know if her appt can be changed to a virtual visit.

## 2022-07-03 NOTE — Telephone Encounter (Signed)
Virtual visit was completed today with provider. Closing encounter.

## 2022-07-03 NOTE — Progress Notes (Signed)
Virtual Visit via Telephone Note   Because of Laura [name]'s co-morbid illnesses, she is at least at moderate risk for complications without adequate follow up.  This format is felt to be most appropriate for this patient at this time.  The patient did not have access to video technology/had technical difficulties with video requiring transitioning to audio format only (telephone).  All issues noted in this document were discussed and addressed.  No physical exam could be performed with this format.  Please refer to the patient's chart for her consent to telehealth for [facility].    Date:  07/03/2022   ID:  Laura [name], DOB [birth year], MRN [id number] The patient was identified using 2 identifiers.  Patient Location: Home Provider Location: Office/Clinic   PCP:  Jonetta Osgood, NP   [place] Prov[facility] Cardiologist:  Kathlyn Sacramento, MD   Evaluation Performed:  Follow-Up Visit  Chief Complaint: ED follow-up  History of Present Illness:    Laura [name] is a 71 y.o. female with nonobstructive CAD, HFrEF secondary to NICM, permanent A-fib, CVA, HTN, HLD, asthma, and sleep apnea on CPAP who presents for ED follow-up.  She was diagnosed with A-fib in 09/2016 and has been rate controlled in the context of intolerance to multiple antiarrhythmic medications.  She was hospitalized in 08/2019 with ischemic colitis with angiogram showing no acute obstructive disease.  She underwent ileum and right colon resection.  She has noted intolerance to metoprolol secondary to increased dyspnea.  She underwent R/LHC in 02/2020 to evaluate pulmonary hypertension and exertional dyspnea.  This showed mild nonobstructive CAD.  RHC showed moderately elevated filling pressures with mild pulmonary hypertension and normal cardiac output.  Her pulmonary wedge pressure at that time was 24 mmHg.  Furosemide was resumed.  She was hospitalized in 10/2020 with an acute right MCA CVA in the  setting of being off anticoagulation for a lumbar injection.  Echo during that admission showed an EF of 45 to 50%.  More recently, she had worsening shortness of breath with echo in 01/2022 demonstrating an EF of 40 to 45%, global hypokinesis, severely dilated LV internal cavity size, normal RV systolic function and ventricular cavity size, mildly to moderately dilated left atrium, mildly dilated right atrium, moderate mitral regurgitation, mild aortic insufficiency with aortic valve sclerosis without evidence of stenosis, a small ASD with left-to-right shunting, and an estimated right atrial pressure of 8 mmHg.  GDMT was escalated with the addition of Entresto.  Diuresis has been limited with underlying renal dysfunction.  She was diagnosed and treated for pneumonia in 04/2022 at [facility].  She was last seen in the office in 04/2022 with continued dyspnea.  She also reported a fall "due to dizziness."  At that time, spironolactone was decreased to 12.5.  She was continued on low-dose Entresto and Cardizem 240 mg daily.   She was seen in the [facility] ED on 06/21/2022 with lower extremity swelling.  High-sensitivity troponin negative x2.  BNP 169.  CTA of the chest was negative for pulmonary embolus or pneumonia.  No evidence of pulmonary edema.  She was not felt to have an acute heart failure exacerbation.  She was discharged to outpatient follow-up.  She underwent ABIs on 06/30/2022 through vascular surgery which showed noncompressible bilateral lower extremity arteries.  Carotid artery ultrasound at that time showed bilateral 1 to 39% ICA stenosis with antegrade flow of the bilateral vertebral arteries and normal flow hemodynamics of the bilateral subclavian arteries.  She is  seen virtually today due to lack of transportation.  She notes her lower extremity swelling is a little improved, though does persist.  She is without symptoms of angina or decompensation.  Her breathing is largely stable, though she does report it  typically gets a little bit worse in the summer as the heat increases.  She does not have any worsening orthopnea.  She has had several falls, though has not hit her head or suffer LOC.  No hematochezia or melena.  Weight is stable at home typically fluctuating by 2 to 3 pounds.  Blood pressure remains stable.   Labs independently reviewed: 06/2022 - Hgb 12.5, PLT 258, magnesium 2.0, potassium 4.1, BUN 23, serum creatinine 1.1 04/2022 - albumin 4.0, AST 54, ALT not calculated due to hemolyzation 12/2021 - AST/ALT normal 10/2021 - A1c 6.5 05/2021 - TC 143, TG 140, HDL 73, LDL 47 11/2020 - digoxin 0.6 12/2019 - TSH normal  Past Medical History:  Diagnosis Date   Asthma    Chronic atrial fibrillation ([place])    a. diagnosed in 09/2016; b. failed flecainide and propafenone due to LE swelling and SOB, could not afford Multaq; c. CHADS2VASc => 5 (CHF, HTN, age x 1, nonobs CAD, female)--> Eliquis   COPD (chronic obstructive pulmonary disease) (HCC)    GERD (gastroesophageal reflux disease)    HFmrEF (heart failure with mid-range ejection fraction) ([place])    a. 12/2019 Echo: EF 40-45%.   Hyperlipidemia    Hypertension    NICM (nonischemic cardiomyopathy) ([place])    a. 12/2019 Echo: EF 40-45%, glob HK, mildly reduced RV fxn, sev dil LA. *HR 130 (afib) during study.   Nonobstructive CAD (coronary artery disease)    a. Lexiscan Myoview 10/2016: no evidence of ischemia, EF 53%; b. 02/2020 Cath: LM nl, LAD 20p, 74m LCX 20ost, OM1/2/3 nl, LPDA nl, LPL1/2 nl, LPAV nl, RCA small, nl.   Obesity    Obstructive sleep apnea    Pulmonary hypertension ([facility]    Sleep apnea    Stroke ([facility]    Systolic dysfunction    a. TTE 10/2016: EF 50%, mild LVH, moderately dilated LA, moderate MR/TR, mild pulmonary hypertension   Past Surgical History:  Procedure Laterality Date   BOWEL RESECTION  09/11/2019   Procedure: SMALL BOWEL RESECTION;  Surgeon: CHerbert Pun MD;  Location: [facility] ORS;  Service: General;;    CARDIAC CATHETERIZATION     cataract surgery     COLONOSCOPY WITH PROPOFOL N/A 03/19/2020   Procedure: COLONOSCOPY WITH PROPOFOL;  Surgeon: AJonathon Bellows MD;  [facility]  Service: Gastroenterology;  Laterality: N/A;   CORONARY ANGIOPLASTY     INCISION AND DRAINAGE ABSCESS Right 06/29/2016   Procedure: INCISION AND DRAINAGE ABSCESS;  Surgeon: RFlorene Glen MD;  Location: [facility] ORS;  Service: General;  Laterality: Right;   INCISION AND DRAINAGE OF WOUND Left 06/29/2016   Procedure: IRRIGATION AND DEBRIDEMENT WOUND;  Surgeon: RFlorene Glen MD;  Location: [facility] ORS;  Service: General;  Laterality: Left;   IR ANGIO VERTEBRAL SEL SUBCLAVIAN INNOMINATE UNI R MOD SED  11/20/2020   IR CT HEAD LTD  11/20/2020   IR PERCUTANEOUS ART THROMBECTOMY/INFUSION INTRACRANIAL INC DIAG ANGIO  11/20/2020   LAPAROSCOPIC RIGHT COLECTOMY  09/11/2019   Procedure: RIGHT COLECTOMY;  Surgeon: CHerbert Pun MD;  Location: [facility] ORS;  Service: General;;   LAPAROSCOPY N/A 09/11/2019   Procedure: LAPAROSCOPY DIAGNOSTIC;  Surgeon: CHerbert Pun MD;  Location: [facility] OR[facility];  Service: General;  Laterality: N/A;   LAPAROTOMY  N/A 09/13/2019   Procedure: REOPENING OF RECENT LAPAROTOMYANASTOMOSIS OF BOWEL;  Surgeon: Herbert Pun, MD;  Location: [facility] OR[facility];  Service: General;  Laterality: N/A;   RADIOLOGY WITH ANESTHESIA N/A 11/20/2020   Procedure: IR WITH ANESTHESIA - CODE STROKE;  Surgeon: Radiologist, Medication, MD;  Location: [place];  Service: Radiology;  Laterality: N/A;   RIGHT/LEFT HEART CATH AND CORONARY ANGIOGRAPHY Bilateral 02/27/2020   Procedure: RIGHT/LEFT HEART CATH AND CORONARY ANGIOGRAPHY;  Surgeon: Wellington Hampshire, MD;  Location: [place] [facility];  Service: Cardiovascular;  Laterality: Bilateral;   VISCERAL ANGIOGRAPHY N/A 09/12/2019   Procedure: VISCERAL ANGIOGRAPHY;  Surgeon: Algernon Huxley, MD;  Location: [place] [facility];  Service: Cardiovascular;  Laterality: N/A;      Current Meds  Medication Sig   Accu-Chek Softclix Lancets lancets Accu-Chek Softclix Lancets  USE AS INSTRUCTED TO CHECK BLOOD SUGARS DAILY 2 HOURS AFTER MEAL   albuterol (VENTOLIN HFA) 108 (90 Base) MCG/ACT inhaler Inhale 2 puffs into the lungs every 4 (four) hours as needed for wheezing or shortness of breath.   bisoprolol (ZEBETA) 5 MG tablet Take 1 tablet (5 mg total) by mouth daily.   ELIQUIS 5 MG TABS tablet TAKE 1 TABLET BY MOUTH TWICE A DAY   EPINEPHrine 0.3 mg/0.3 mL IJ SOAJ injection Inject 0.3 mg into the muscle as needed for anaphylaxis.    fluticasone (FLONASE) 50 MCG/ACT nasal spray Place into both nostrils daily.   furosemide (LASIX) 20 MG tablet Take 1 tablet (20 mg total) by mouth daily.   glucose blood (ACCU-CHEK GUIDE) test strip Use as instructed to check blood sugars daily 2 hours after meal DX E11.65   ipratropium-albuterol (DUONEB) 0.5-2.5 (3) MG/3ML SOLN USE 1 VIAL IN NEBULIZER EVERY 6 HOURS - and as needed   loperamide (IMODIUM) 2 MG capsule Take 4 mg by mouth as needed for diarrhea or loose stools.   methocarbamol (ROBAXIN) 500 MG tablet Take 1 tablet by mouth 3 (three) times daily as needed.   montelukast (SINGULAIR) 10 MG tablet TAKE 1 TABLET BY MOUTH EVERY DAY   omeprazole (PRILOSEC) 40 MG capsule Take 1 capsule (40 mg total) by mouth daily.   ondansetron (ZOFRAN) 4 MG tablet Take 1 tablet (4 mg total) by mouth every 8 (eight) hours as needed for nausea or vomiting.   predniSONE (DELTASONE) 10 MG tablet TAKE 1 TABLET (10 MG TOTAL) BY MOUTH DAILY WITH BREAKFAST.   predniSONE (DELTASONE) 10 MG tablet Take one tab 3 x day for 3 days, then take one tab 2 x a day for 3 days and then take one tab a day for 3 days for copd   Probiotic Product (PROBIOTIC-10) CAPS Take 1 capsule by mouth daily.    promethazine (PHENERGAN) 12.5 MG tablet TAKE 1 TABLET (12.5 MG TOTAL) BY MOUTH EVERY 12 (TWELVE) HOURS AS NEEDED FOR NAUSEA OR VOMITING.   rosuvastatin (CRESTOR) 20 MG tablet  TAKE 1 TABLET BY MOUTH EVERY DAY   sacubitril-valsartan (ENTRESTO) 24-26 MG Take 1 tablet by mouth 2 (two) times daily.   THEO-24 100 MG 24 hr capsule TAKE 1 CAPSULE BY MOUTH EVERY DAY   traMADol (ULTRAM) 50 MG tablet Take 50 mg by mouth 4 (four) times daily as needed.   [DISCONTINUED] diltiazem (CARDIZEM CD) 240 MG 24 hr capsule TAKE 1 CAPSULE BY MOUTH EVERY DAY   [DISCONTINUED] spironolactone (ALDACTONE) 25 MG tablet Take 0.5 tablets (12.5 mg total) by mouth daily.     Allergies:   Flecainide, Metoprolol, Propafenone,  and Rivaroxaban   Social History   Tobacco Use   Smoking status: Never   Smokeless tobacco: Never  Vaping Use   Vaping Use: Never used  Substance Use Topics   Alcohol use: No   Drug use: No     Family Hx: The patient's family history includes COPD in her father; Cancer in her daughter; Dementia in her mother; Heart disease in her brother; Osteoporosis in her mother; Vascular Disease in her mother.  ROS:   Please see the history of present illness.     All other systems reviewed and are negative.   Prior CV studies:   The following studies were reviewed today:  2D echo 01/29/2022: 1. Left ventricular ejection fraction, by estimation, is 40 to 45%. The  left ventricle has mild to moderately decreased function. The left  ventricle demonstrates global hypokinesis. The left ventricular internal  cavity size was severely dilated. Left  ventricular diastolic parameters are indeterminate.   2. Right ventricular systolic function is normal. The right ventricular  size is normal.   3. Left atrial size was mild to moderately dilated.   4. Right atrial size was mildly dilated.   5. The mitral valve is grossly normal. Moderate mitral valve  regurgitation.   6. The aortic valve is tricuspid. Aortic valve regurgitation is mild.  Aortic valve sclerosis is present, with no evidence of aortic valve  stenosis.   7. The inferior vena cava is dilated in size with >50%  respiratory  variability, suggesting right atrial pressure of 8 mmHg.   8. Evidence of atrial level shunting detected by color flow Doppler.   Comparison(s): LVEF 45-50%. __________  2D echo 11/21/2020: 1. Left ventricular ejection fraction, by estimation, is 45 to 50%. The  left ventricle has mildly decreased function. Left ventricular endocardial  border not optimally defined to evaluate regional wall motion. Left  ventricular diastolic parameters are  indeterminate.   2. Right ventricular systolic function is mildly reduced. The right  ventricular size is mildly enlarged.   3. The mitral valve is grossly normal. No evidence of mitral valve  regurgitation.   4. The aortic valve was not well visualized. Aortic valve regurgitation  is not visualized.   5. The inferior vena cava is normal in size with <50% respiratory  variability, suggesting right atrial pressure of 8 mmHg.   Comparison(s): A prior study was performed on 01/19/20. Unable to make  significant comparisons (image quality). Likely, LV and RV mildly reduced.   Conclusion(s)/Recommendation(s): Technically difficult study. __________  [facility] 02/27/2020: Ost Cx lesion is 20% stenosed. Prox LAD lesion is 20% stenosed. Prox LAD to Mid LAD lesion is 30% stenosed.   1.  Mild nonobstructive coronary artery disease.  Left dominant coronary arteries. 2.  Left ventricular angiography was not performed.  EF was mildly reduced by echo. 3.  Right heart catheterization showed moderately elevated filling pressures with an RA of 15 mmHg, pulmonary capillary wedge pressure of 24 mmHg, PA pressure of 43/24 with a mean of 30 mmHg.  Normal cardiac output at 5.18 with a cardiac index of 2.34.  Pulmonary vascular resistance was only 1.16 [place] units.   Recommendations: No significant coronary artery disease to require revascularization. Pulmonary hypertension seems to be all related to left sided heart failure.  The patient is volume  overloaded.  She used to be on 40 mg of furosemide but that was discontinued when she was having diarrhea.  I am going to resume furosemide at the lower dose  of 20 mg daily. __________  2D echo 01/19/2020:  1. Left ventricular ejection fraction, by visual estimation, is 40 to  45%. The left ventricle has mild to moderately decreased function. There  is no left ventricular hypertrophy.   2. Global right ventricle has mildly reduced systolic function.The right  ventricular size is mildly enlarged. No increase in right ventricular wall  thickness.   3. Left ventricular diastolic parameters are indeterminate.   4. The left ventricle demonstrates global hypokinesis.   5. Left atrial size was severely dilated.   6. The inferior vena cava is dilated in size with <50% respiratory  variability, suggesting right atrial pressure of 15 mmHg.   7. Rhythm is atrial fibrillation, rate 130 bpm.   In comparison to the previous echocardiogram(s): EF 60%.  FINDINGS  __________  2D echo 05/08/2017: - Left ventricle: The cavity size was normal. Systolic function was    normal. The estimated ejection fraction was in the range of 50%    to 55%. Wall motion was normal; there were no regional wall    motion abnormalities. The study is not technically sufficient to    allow evaluation of LV diastolic function.  - Mitral valve: There was mild regurgitation.  - Left atrium: The atrium was moderately dilated.  - Right ventricle: Systolic function was normal.  - Pulmonary arteries: Systolic pressure was within the normal    range.   Impressions:   - Rhythm is atrial fibrillation.  Labs/Other Tests and Data Reviewed:    EKG:   EKG from 02/11/2022 was reviewed  Recent Labs: 01/03/2022: ALT 23; Hemoglobin 15.1; Platelets 296 05/16/2022: B Natriuretic Peptide 227.5 06/12/2022: BUN 28; Creatinine, Ser 1.31; Potassium 4.1; Sodium 137   Recent Lipid Panel Lab Results  Component Value Date/Time   CHOL 143  06/12/2021 10:28 AM   TRIG 140 06/12/2021 10:28 AM   HDL 73 06/12/2021 10:28 AM   CHOLHDL 2.0 06/12/2021 10:28 AM   CHOLHDL 3.3 11/21/2020 06:16 AM   LDLCALC 47 06/12/2021 10:28 AM    Wt Readings from Last 3 Encounters:  07/03/22 252 lb (114.3 kg)  06/30/22 249 lb 9.6 oz (113.2 kg)  06/26/22 252 lb (114.3 kg)     Risk Assessment/Calculations:    CHA2DS2-VASc Score = 7   This indicates a 11.2% annual risk of stroke. The patient's score is based upon: CHF History: 1 HTN History: 1 Diabetes History: 0 Stroke History: 2 Vascular Disease History: 1 Age Score: 1 Gender Score: 1       Objective:    Vital Signs:  BP 122/71   Pulse 81   Ht '5\' 4"'$  (1.626 m)   Wt 252 lb (114.3 kg)   LMP  (LMP Unknown)   SpO2 99%   BMI 43.26 kg/m      ASSESSMENT & PLAN:    HFrEF secondary to NICM: She does not have any red flag symptoms concerning for volume overload or decompensation virtually today.  I do wonder if diltiazem is contributing to her lower extremity swelling.  We should also look to taper her off of this medication given her cardiomyopathy with recommendation to escalate GDMT.  I have recommended we discontinue diltiazem and start bisoprolol 5 mg daily.  Historically, she has noted to be intolerant to metoprolol secondary to worsening dyspnea.  We will also place her back on spironolactone 25 mg daily, which is an increase from 12.5 mg daily.  Check BMP 1 week thereafter.  She will continue current dose of  Entresto and furosemide.  Recommend continued escalation of GDMT in follow-up next month.  She may also benefit from compression stockings and leg elevation.  Nonobstructive CAD: No symptoms of angina or decompensation.  She remains on apixaban in place of aspirin to minimize bleeding risk.  She will otherwise continue rosuvastatin.  No indication for ischemic testing at this time.  Permanent A-fib: Ventricular rates are well controlled by her report.  We will transition from  diltiazem to bisoprolol as outlined above.  She remains on apixaban without symptoms concerning for bleeding.  History of CVA: Embolic and in the setting of being off anticoagulation.  Any future interruption of apixaban will require bridging with Lovenox.  HLD: LDL 47.  She remains on rosuvastatin.      Time:   Today, I have spent 9 minutes with the patient with telehealth technology discussing the above problems.     Medication Adjustments/Labs and Tests Ordered: Current medicines are reviewed at length with the patient today.  Concerns regarding medicines are outlined above.   Tests Ordered: Orders Placed This Encounter  Procedures   Basic metabolic panel    Medication Changes: Meds ordered this encounter  Medications   spironolactone (ALDACTONE) 25 MG tablet    Sig: Take 1 tablet (25 mg total) by mouth daily.    Dispense:  90 tablet    Refill:  3   bisoprolol (ZEBETA) 5 MG tablet    Sig: Take 1 tablet (5 mg total) by mouth daily.    Dispense:  90 tablet    Refill:  3    Follow Up:  In Person  as scheduled next month  Signed, Christell Faith, Hershal Coria  07/03/2022 12:04 PM    [place]

## 2022-07-03 NOTE — Patient Instructions (Addendum)
Medication Instructions:  Your physician has recommended you make the following change in your medication:   STOP Diltiazem START Bisoprolol 5 mg once daily INCREASE Spironolactone to 25 mg once daily   *If you need a refill on your cardiac medications before your next appointment, please call your pharmacy*   Lab Work: BMP in one week over at the [place] [facility] then go to 1st desk on the right to check in (REGISTRATION). No appointment is needed for this. Just let them know you are there for labs.     Lab hours: Monday- Friday (7:30 am- 5:30 pm)  If you have labs (blood work) drawn today and your tests are completely normal, you will receive your results only by: [place] (if you have MyChart) OR A paper copy in the mail If you have any lab test that is abnormal or we need to change your treatment, we will call you to review the results.   Testing/Procedures: None   Follow-Up: At [facility], you and your health needs are our priority.  As part of our continuing mission to provide you with exceptional heart care, we have created designated Provider Care Teams.  These Care Teams include your primary Cardiologist (physician) and Advanced Practice Providers (APPs -  Physician Assistants and Nurse Practitioners) who all work together to provide you with the care you need, when you need it.   Your next appointment:    Keep scheduled appointment with Dr. Fletcher Anon on 08/19/22 at 3:20 pm  The format for your next appointment:   In Person  Provider:   Kathlyn Sacramento, MD       Important Information About Sugar

## 2022-07-04 ENCOUNTER — Encounter (INDEPENDENT_AMBULATORY_CARE_PROVIDER_SITE_OTHER): Payer: Self-pay | Admitting: Vascular Surgery

## 2022-07-04 NOTE — Progress Notes (Signed)
MRN : [id number]  Laura [name] is a 71 y.o. (1951/04/05) female who presents with chief complaint of check carotid arteries.  History of Present Illness:  The patient is seen for follow up evaluation of carotid stenosis. The carotid stenosis followed by ultrasound.   The patient denies amaurosis fugax. There is no recent history of TIA symptoms or focal motor deficits. There is no prior documented CVA.  The patient is taking enteric-coated aspirin 81 mg daily.  There is no history of migraine headaches. There is no history of seizures.  The patient is also here for followup evaluation regarding leg swelling.  The swelling has persisted and the pain associated with swelling continues. There have not been any interval development of a ulcerations or wounds.  Since the previous visit the patient has been wearing graduated compression stockings and has noted little if any improvement in the lymphedema. The patient has been using compression routinely morning until night.  The patient also states elevation during the day and exercise is being done too.   She also has PAD.  No recent shortening of the patient's walking distance or new symptoms consistent with claudication.  No history of rest pain symptoms. No new ulcers or wounds of the lower extremities have occurred.  No recent episodes of angina or shortness of breath documented.   Carotid Duplex done today shows 1-39% bilateral ICA stenosis.  No change compared to last study   Current Meds  Medication Sig   Accu-Chek Softclix Lancets lancets Accu-Chek Softclix Lancets  USE AS INSTRUCTED TO CHECK BLOOD SUGARS DAILY 2 HOURS AFTER MEAL   albuterol (VENTOLIN HFA) 108 (90 Base) MCG/ACT inhaler Inhale 2 puffs into the lungs every 4 (four) hours as needed for wheezing or shortness of breath.   ALPRAZolam (XANAX) 0.25 MG tablet Take 1 tablet (0.25 mg total) by mouth 2 (two) times daily as needed for anxiety. (Patient not taking:  Reported on 07/03/2022)   ELIQUIS 5 MG TABS tablet TAKE 1 TABLET BY MOUTH TWICE A DAY   EPINEPHrine 0.3 mg/0.3 mL IJ SOAJ injection Inject 0.3 mg into the muscle as needed for anaphylaxis.    fluticasone (FLONASE) 50 MCG/ACT nasal spray Place into both nostrils daily.   furosemide (LASIX) 20 MG tablet Take 1 tablet (20 mg total) by mouth daily.   glucose blood (ACCU-CHEK GUIDE) test strip Use as instructed to check blood sugars daily 2 hours after meal DX E11.65   ipratropium-albuterol (DUONEB) 0.5-2.5 (3) MG/3ML SOLN USE 1 VIAL IN NEBULIZER EVERY 6 HOURS - and as needed   loperamide (IMODIUM) 2 MG capsule Take 4 mg by mouth as needed for diarrhea or loose stools.   methocarbamol (ROBAXIN) 500 MG tablet Take 1 tablet by mouth 3 (three) times daily as needed.   montelukast (SINGULAIR) 10 MG tablet TAKE 1 TABLET BY MOUTH EVERY DAY   omeprazole (PRILOSEC) 40 MG capsule Take 1 capsule (40 mg total) by mouth daily.   ondansetron (ZOFRAN) 4 MG tablet Take 1 tablet (4 mg total) by mouth every 8 (eight) hours as needed for nausea or vomiting.   predniSONE (DELTASONE) 10 MG tablet TAKE 1 TABLET (10 MG TOTAL) BY MOUTH DAILY WITH BREAKFAST.   predniSONE (DELTASONE) 10 MG tablet Take one tab 3 x day for 3 days, then take one tab 2 x a day for 3 days and then take one tab a day for 3 days for copd   Probiotic Product (PROBIOTIC-10) CAPS Take 1  capsule by mouth daily.    promethazine (PHENERGAN) 12.5 MG tablet TAKE 1 TABLET (12.5 MG TOTAL) BY MOUTH EVERY 12 (TWELVE) HOURS AS NEEDED FOR NAUSEA OR VOMITING.   rosuvastatin (CRESTOR) 20 MG tablet TAKE 1 TABLET BY MOUTH EVERY DAY   sacubitril-valsartan (ENTRESTO) 24-26 MG Take 1 tablet by mouth 2 (two) times daily.   THEO-24 100 MG 24 hr capsule TAKE 1 CAPSULE BY MOUTH EVERY DAY   traMADol (ULTRAM) 50 MG tablet Take 50 mg by mouth 4 (four) times daily as needed.   [DISCONTINUED] amoxicillin-clavulanate (AUGMENTIN) 875-125 MG tablet TAKE 1 TABLET BY MOUTH TWICE A DAY  FOR 7 DAYS (Patient not taking: Reported on 07/03/2022)   [DISCONTINUED] benzonatate (TESSALON) 100 MG capsule Take 1 capsule (100 mg total) by mouth 2 (two) times daily as needed for cough. (Patient not taking: Reported on 07/03/2022)   [DISCONTINUED] diltiazem (CARDIZEM CD) 240 MG 24 hr capsule TAKE 1 CAPSULE BY MOUTH EVERY DAY   [DISCONTINUED] Fluticasone-Umeclidin-Vilant (TRELEGY ELLIPTA IN) Inhale 1 puff into the lungs daily. (Patient not taking: Reported on 07/03/2022)   [DISCONTINUED] gentamicin cream (GARAMYCIN) 0.1 % Apply 1 application topically 2 (two) times daily. (Patient not taking: Reported on 07/03/2022)   [DISCONTINUED] nitrofurantoin, macrocrystal-monohydrate, (MACROBID) 100 MG capsule Take 1 cap twice per day for 10 days. (Patient not taking: Reported on 07/03/2022)   [DISCONTINUED] spironolactone (ALDACTONE) 25 MG tablet Take 0.5 tablets (12.5 mg total) by mouth daily.    Past Medical History:  Diagnosis Date   Asthma    Chronic atrial fibrillation ([place])    a. diagnosed in 09/2016; b. failed flecainide and propafenone due to LE swelling and SOB, could not afford Multaq; c. CHADS2VASc => 5 (CHF, HTN, age x 1, nonobs CAD, female)--> Eliquis   COPD (chronic obstructive pulmonary disease) (HCC)    GERD (gastroesophageal reflux disease)    HFmrEF (heart failure with mid-range ejection fraction) ([place])    a. 12/2019 Echo: EF 40-45%.   Hyperlipidemia    Hypertension    NICM (nonischemic cardiomyopathy) ([place])    a. 12/2019 Echo: EF 40-45%, glob HK, mildly reduced RV fxn, sev dil LA. *HR 130 (afib) during study.   Nonobstructive CAD (coronary artery disease)    a. Lexiscan Myoview 10/2016: no evidence of ischemia, EF 53%; b. 02/2020 Cath: LM nl, LAD 20p, 67m LCX 20ost, OM1/2/3 nl, LPDA nl, LPL1/2 nl, LPAV nl, RCA small, nl.   Obesity    Obstructive sleep apnea    Pulmonary hypertension ([place]    Sleep apnea    [place]    Systolic dysfunction    a. TTE 10/2016: EF 50%, mild LVH,  moderately dilated LA, moderate MR/TR, mild pulmonary hypertension    Past Surgical History:  Procedure Laterality Date   BOWEL RESECTION  09/11/2019   Procedure: SMALL BOWEL RESECTION;  Surgeon: CHerbert Pun MD;  Location: [facility];  Service: General;;   CARDIAC CATHETERIZATION     cataract surgery     COLONOSCOPY WITH PROPOFOL N/A 03/19/2020   Procedure: COLONOSCOPY WITH PROPOFOL;  Surgeon: AJonathon Bellows MD;  [facility];  Service: Gastroenterology;  Laterality: N/A;   CORONARY ANGIOPLASTY     INCISION AND DRAINAGE ABSCESS Right 06/29/2016   Procedure: INCISION AND DRAINAGE ABSCESS;  Surgeon: RFlorene Glen MD;  Location: [facility];  Service: General;  Laterality: Right;   INCISION AND DRAINAGE OF WOUND Left 06/29/2016   Procedure: IRRIGATION AND DEBRIDEMENT WOUND;  Surgeon: RFlorene Glen MD;  Location: [facility];  Service: General;  Laterality: Left;   IR ANGIO VERTEBRAL SEL SUBCLAVIAN INNOMINATE UNI R MOD SED  11/20/2020   IR CT HEAD LTD  11/20/2020   IR PERCUTANEOUS ART THROMBECTOMY/INFUSION INTRACRANIAL INC DIAG ANGIO  11/20/2020   LAPAROSCOPIC RIGHT COLECTOMY  09/11/2019   Procedure: RIGHT COLECTOMY;  Surgeon: Herbert Pun, MD;  Location: [facility];  Service: General;;   LAPAROSCOPY N/A 09/11/2019   Procedure: LAPAROSCOPY DIAGNOSTIC;  Surgeon: Herbert Pun, MD;  Location: [facility];  Service: General;  Laterality: N/A;   LAPAROTOMY N/A 09/13/2019   Procedure: REOPENING OF RECENT LAPAROTOMYANASTOMOSIS OF BOWEL;  Surgeon: Herbert Pun, MD;  Location: [facility];  Service: General;  Laterality: N/A;   RADIOLOGY WITH ANESTHESIA N/A 11/20/2020   Procedure: IR WITH ANESTHESIA - CODE STROKE;  Surgeon: Radiologist, Medication, MD;  Location: [place];  Service: Radiology;  Laterality: N/A;   RIGHT/LEFT HEART CATH AND CORONARY ANGIOGRAPHY Bilateral 02/27/2020   Procedure: RIGHT/LEFT HEART CATH AND CORONARY ANGIOGRAPHY;  Surgeon: Wellington Hampshire, MD;   Location: [place] [facility];  Service: Cardiovascular;  Laterality: Bilateral;   VISCERAL ANGIOGRAPHY N/A 09/12/2019   Procedure: VISCERAL ANGIOGRAPHY;  Surgeon: Algernon Huxley, MD;  Location: [place] [facility];  Service: Cardiovascular;  Laterality: N/A;    Social History Social History   Tobacco Use   Smoking status: Never   Smokeless tobacco: Never  Vaping Use   Vaping Use: Never used  Substance Use Topics   Alcohol use: No   Drug use: No    Family History Family History  Problem Relation Age of Onset   Dementia Mother    Osteoporosis Mother    Vascular Disease Mother    COPD Father    Heart disease Brother    Cancer Daughter     Allergies  Allergen Reactions   Flecainide Shortness Of Breath   Metoprolol Shortness Of Breath, Swelling and Other (See Comments)    "My limbs swell" also   Propafenone Shortness Of Breath, Swelling and Other (See Comments)    "My limbs swell" also   Rivaroxaban Swelling and Other (See Comments)    Xarelto- "My limbs swell" Other reaction(s): Muscle Pain, Other (See Comments) Other reaction(s): Other (See Comments) Xarelto- "My limbs swell"     REVIEW OF SYSTEMS (Negative unless checked)  Constitutional: '[]'$ Weight loss  '[]'$ Fever  '[]'$ Chills Cardiac: '[]'$ Chest pain   '[]'$ Chest pressure   '[]'$ Palpitations   '[]'$ Shortness of breath when laying flat   '[]'$ Shortness of breath with exertion. Vascular:  '[x]'$ Pain in legs with walking   '[]'$ Pain in legs at rest  '[]'$ History of DVT   '[]'$ Phlebitis   '[x]'$ Swelling in legs   '[]'$ Varicose veins   '[]'$ Non-healing ulcers Pulmonary:   '[]'$ Uses home oxygen   '[]'$ Productive cough   '[]'$ Hemoptysis   '[]'$ Wheeze  '[]'$ COPD   '[]'$ Asthma Neurologic:  '[]'$ Dizziness   '[]'$ Seizures   '[]'$ History of stroke   '[]'$ History of TIA  '[]'$ Aphasia   '[]'$ Vissual changes   '[]'$ Weakness or numbness in arm   '[]'$ Weakness or numbness in leg Musculoskeletal:   '[]'$ Joint swelling   '[x]'$ Joint pain   '[]'$ Low back pain Hematologic:  '[]'$ Easy bruising  '[]'$ Easy bleeding   '[]'$ Hypercoagulable  state   '[]'$ Anemic Gastrointestinal:  '[]'$ Diarrhea   '[]'$ Vomiting  '[]'$ Gastroesophageal reflux/heartburn   '[]'$ Difficulty swallowing. Genitourinary:  '[]'$ Chronic kidney disease   '[]'$ Difficult urination  '[]'$ Frequent urination   '[]'$ Blood in urine Skin:  '[]'$ Rashes   '[]'$ Ulcers  Psychological:  '[]'$ History of anxiety   '[]'$  History of major depression.  Physical Examination  Vitals:   06/30/22 1539  BP: 124/68  Pulse: 91  Resp: 18  Weight: 249 lb 9.6 oz (113.2 kg)   Body mass index is 42.84 kg/m. Gen: WD/WN, NAD Head: [place]/AT, No temporalis wasting.  Ear/Nose/Throat: Hearing grossly intact, nares w/o erythema or drainage Eyes: PER, EOMI, sclera nonicteric.  Neck: Supple, no masses.  No bruit or JVD.  Pulmonary:  Good air movement, no audible wheezing, no use of accessory muscles.  Cardiac: RRR, normal S1, S2, no Murmurs. Vascular:  carotid bruit noted;  3+ edema bilateral lower extremities with moderate venous stasis changes Vessel Right Left  Radial Palpable Palpable  Carotid  Palpable  Palpable  Subclav  Palpable Palpable  Gastrointestinal: soft, non-distended. No guarding/no peritoneal signs.  Musculoskeletal: M/S 5/5 throughout.  No visible deformity.  Neurologic: CN 2-12 intact. Pain and light touch intact in extremities.  Symmetrical.  Speech is fluent. Motor exam as listed above. Psychiatric: Judgment intact, Mood & affect appropriate for pt's clinical situation. Dermatologic: No rashes or ulcers noted.  No changes consistent with cellulitis.   CBC Lab Results  Component Value Date   WBC 13.9 (H) 01/03/2022   HGB 15.1 (H) 01/03/2022   HCT 43.8 01/03/2022   MCV 94.2 01/03/2022   PLT 296 01/03/2022    BMET    Component Value Date/Time   NA 137 06/12/2022 1558   NA 141 02/20/2022 1406   NA 140 11/28/2013 2312   K 4.1 06/12/2022 1558   K 4.1 11/28/2013 2312   CL 106 06/12/2022 1558   CL 108 (H) 11/28/2013 2312   CO2 21 (L) 06/12/2022 1558   CO2 27 11/28/2013 2312   GLUCOSE 223 (H)  06/12/2022 1558   GLUCOSE 130 (H) 11/28/2013 2312   BUN 28 (H) 06/12/2022 1558   BUN 24 02/20/2022 1406   BUN 29 (H) 11/28/2013 2312   CREATININE 1.31 (H) 06/12/2022 1558   CREATININE 0.79 11/28/2013 2312   CALCIUM 9.4 06/12/2022 1558   CALCIUM 8.7 11/28/2013 2312   GFRNONAA 43 (L) 06/12/2022 1558   GFRNONAA >60 11/28/2013 2312   GFRAA 80 12/04/2020 1517   GFRAA >60 11/28/2013 2312   CrCl cannot be calculated (Patient's most recent lab result is older than the maximum 21 days allowed.).  COAG Lab Results  Component Value Date   INR 1.2 11/20/2020   INR 3.6 (A) 06/13/2020   INR 3.9 (A) 06/11/2020    Radiology VAS [place] ABI WITH/WO TBI  Result Date: 06/30/2022  LOWER EXTREMITY DOPPLER STUDY Patient Name:  Laura [name]  Date of Exam:   06/30/2022 Medical Rec #: [id number]         Accession #:    [id number] Date of Birth: [birth year]          Patient Gender: F Patient Age:   48 years Exam Location:  [facility] Procedure:      VAS [place] ABI WITH/WO TBI Referring Phys: Hortencia Pilar --------------------------------------------------------------------------------  Indications: Peripheral artery disease, and swelling.  Comparison Study: 06/21/2021 Performing Technologist: Almira Coaster RVS  Examination Guidelines: A complete evaluation includes at minimum, Doppler waveform signals and systolic blood pressure reading at the level of bilateral brachial, anterior tibial, and posterior tibial arteries, when vessel segments are accessible. Bilateral testing is considered an integral part of a complete examination. Photoelectric Plethysmograph (PPG) waveforms and toe systolic pressure readings are included as required and additional duplex testing as needed. Limited examinations for reoccurring indications may be performed as noted.  ABI Findings: +---------+------------------+-----+---------+--------+  Right    Rt Pressure (mmHg)IndexWaveform Comment   +---------+------------------+-----+---------+--------+ Brachial 128                                      +---------+------------------+-----+---------+--------+ ATA      193               1.51 triphasicNC       +---------+------------------+-----+---------+--------+ PTA      203               1.59 triphasicNC       +---------+------------------+-----+---------+--------+ Great Toe94                0.73 Normal            +---------+------------------+-----+---------+--------+ +---------+------------------+-----+---------+-------+ Left     Lt Pressure (mmHg)IndexWaveform Comment +---------+------------------+-----+---------+-------+ ATA      83                0.65 triphasicNC      +---------+------------------+-----+---------+-------+ PTA      210               1.64 triphasicNC      +---------+------------------+-----+---------+-------+ Great Toe83                0.65 Abnormal         +---------+------------------+-----+---------+-------+ +-------+-----------+-----------+------------+------------+ ABI/TBIToday's ABIToday's TBIPrevious ABIPrevious TBI +-------+-----------+-----------+------------+------------+ Right  >1.0 [place]    .73        1.05        1.01         +-------+-----------+-----------+------------+------------+ Left   >1.0 [place]    .65        1.17        .88          +-------+-----------+-----------+------------+------------+ Compared to prior study on 06/21/2021. Bilateral TBIs appear decreased compared to prior study on 06/21/2021.  Summary: Right: Resting right ankle-brachial index indicates noncompressible right lower extremity arteries. The right toe-brachial index is normal. Left: Resting left ankle-brachial index indicates noncompressible left lower extremity arteries. The left toe-brachial index is abnormal.  *See table(s) above for measurements and observations.  Electronically signed by Hortencia Pilar MD on 06/30/2022 at 5:18:04 PM.     Final    VAS US CAROTID  Result Date: 06/30/2022 Carotid Arterial Duplex Study Patient Name:  Laura [name]  Date of Exam:   06/30/2022 Medical Rec #: [id number]         Accession #:    [id number] Date of Birth: 1951-11-06          Patient Gender: F Patient Age:   87 years Exam Location:  [facility] Procedure:      VAS US CAROTID Referring Phys: Eulogio Ditch --------------------------------------------------------------------------------  Indications:       Carotid artery disease. Comparison Study:  06/21/2021 Performing Technologist: Almira Coaster RVS  Examination Guidelines: A complete evaluation includes B-mode imaging, spectral Doppler, color Doppler, and power Doppler as needed of all accessible portions of each vessel. Bilateral testing is considered an integral part of a complete examination. Limited examinations for reoccurring indications may be performed as noted.  Right Carotid Findings: +----------+--------+--------+--------+------------------+--------+           PSV cm/sEDV cm/sStenosisPlaque DescriptionComments +----------+--------+--------+--------+------------------+--------+ CCA Prox  83      20                                         +----------+--------+--------+--------+------------------+--------+  CCA Mid   71      17                                         +----------+--------+--------+--------+------------------+--------+ CCA Distal60      16                                         +----------+--------+--------+--------+------------------+--------+ ICA Prox  53      16                                         +----------+--------+--------+--------+------------------+--------+ ICA Mid   54      19                                         +----------+--------+--------+--------+------------------+--------+ ICA Distal73      26                                          +----------+--------+--------+--------+------------------+--------+ ECA       46      6                                          +----------+--------+--------+--------+------------------+--------+ +----------+--------+-------+--------+-------------------+           PSV cm/sEDV cmsDescribeArm Pressure (mmHG) +----------+--------+-------+--------+-------------------+ Subclavian110     0                                  +----------+--------+-------+--------+-------------------+ +---------+--------+--+--------+--+ VertebralPSV cm/s55EDV cm/s19 +---------+--------+--+--------+--+  Left Carotid Findings: +----------+--------+--------+--------+------------------+--------+           PSV cm/sEDV cm/sStenosisPlaque DescriptionComments +----------+--------+--------+--------+------------------+--------+ CCA Prox  86      24                                         +----------+--------+--------+--------+------------------+--------+ CCA Mid   117     22                                         +----------+--------+--------+--------+------------------+--------+ CCA Distal70      17                                         +----------+--------+--------+--------+------------------+--------+ ICA Prox  46      10                                         +----------+--------+--------+--------+------------------+--------+ ICA Mid   78  16                                         +----------+--------+--------+--------+------------------+--------+ ICA Distal96      32                                         +----------+--------+--------+--------+------------------+--------+ ECA       58      0                                          +----------+--------+--------+--------+------------------+--------+ +----------+--------+--------+--------+-------------------+           PSV cm/sEDV cm/sDescribeArm Pressure (mmHG)  +----------+--------+--------+--------+-------------------+ Subclavian114     0                                   +----------+--------+--------+--------+-------------------+ +---------+--------+--+--------+-+ VertebralPSV cm/s65EDV cm/s9 +---------+--------+--+--------+-+   Summary: Right Carotid: Velocities in the right ICA are consistent with a 1-39% stenosis. Left Carotid: Velocities in the left ICA are consistent with a 1-39% stenosis. Vertebrals:  Bilateral vertebral arteries demonstrate antegrade flow. Subclavians: Normal flow hemodynamics were seen in bilateral subclavian              arteries. *See table(s) above for measurements and observations.  Electronically signed by Hortencia Pilar MD on 06/30/2022 at 5:16:21 PM.    Final      Assessment/Plan 1. Atherosclerosis of both carotid arteries Recommend:  Given the patient's asymptomatic subcritical stenosis no further invasive testing or surgery at this time.  Duplex ultrasound shows 1-39% stenosis bilaterally.  Continue antiplatelet therapy as prescribed Continue management of CAD, HTN and Hyperlipidemia Healthy heart diet,  encouraged exercise at least 4 times per week Follow up in 12 months with duplex ultrasound and physical exam    2. PAD (peripheral artery disease) (HCC)  Recommend:  The patient has evidence of atherosclerosis of the lower extremities with claudication.  The patient does not voice lifestyle limiting changes at this point in time.  Noninvasive studies do not suggest clinically significant change.  No invasive studies, angiography or surgery at this time The patient should continue walking and begin a more formal exercise program.  The patient should continue antiplatelet therapy and aggressive treatment of the lipid abnormalities  No changes in the patient's medications at this time  Continued surveillance is indicated as atherosclerosis is likely to progress with time.    The patient will  continue follow up with noninvasive studies as ordered.    3. Lymphedema Recommend:  No surgery or intervention at this point in time.    I have reviewed my discussion with the patient regarding lymphedema and why it  causes symptoms.  Patient will continue wearing graduated compression on a daily basis. The patient should put the compression on first thing in the morning and removing them in the evening. The patient should not sleep in the compression.   In addition, behavioral modification throughout the day will be continued.  This will include frequent elevation (such as in a recliner), use of over the counter pain medications as needed and exercise such as walking.  The systemic causes  for chronic edema such as liver, kidney and cardiac etiologies do not appear to have significant changed over the past year.    Despite conservative treatments including graduated compression therapy class 1 and behavioral modification including exercise and elevation the patient  has not obtained adequate control of the lymphedema.  The patient still has stage 3 lymphedema and therefore, I believe that a lymph pump should be added to improve the control of the patient's lymphedema.  Additionally, a lymph pump is warranted because it will reduce the risk of cellulitis and ulceration in the future.   A total of 55 minutes was spent with this patient and greater than 50% was spent in counseling and coordination of care with the patient.  Discussion included the treatment options for vascular disease including indications for surgery and intervention.  Also discussed is the appropriate timing of treatment.  In addition medical therapy was discussed.   Patient should follow-up in six months    4. Chronic atrial fibrillation (HCC) Continue antiarrhythmia medications as already ordered, these medications have been reviewed and there are no changes at this time.  Continue anticoagulation as ordered by Cardiology  Service   5. Essential hypertension Continue antihypertensive medications as already ordered, these medications have been reviewed and there are no changes at this time.     Hortencia Pilar, MD  07/04/2022 5:05 PM

## 2022-07-11 ENCOUNTER — Other Ambulatory Visit
Admission: RE | Admit: 2022-07-11 | Discharge: 2022-07-11 | Disposition: A | Discharge status: Home / Self Care | Payer: Medicare Other | Attending: Physician Assistant | Admitting: Physician Assistant

## 2022-07-11 DIAGNOSIS — I4821 Permanent atrial fibrillation: Secondary | ICD-10-CM | POA: Diagnosis not present

## 2022-07-11 DIAGNOSIS — Z8673 Personal history of transient ischemic attack (TIA), and cerebral infarction without residual deficits: Secondary | ICD-10-CM | POA: Diagnosis not present

## 2022-07-11 DIAGNOSIS — I2510 Atherosclerotic heart disease of native coronary artery without angina pectoris: Secondary | ICD-10-CM

## 2022-07-11 DIAGNOSIS — I5020 Unspecified systolic (congestive) heart failure: Secondary | ICD-10-CM | POA: Diagnosis not present

## 2022-07-11 LAB — BASIC METABOLIC PANEL
Anion gap: 7 (ref 5–15)
BUN: 23 mg/dL (ref 8–23)
CO2: 26 mmol/L (ref 22–32)
Calcium: 8.9 mg/dL (ref 8.9–10.3)
Chloride: 107 mmol/L (ref 98–111)
Creatinine, Ser: 1.08 mg/dL — ABNORMAL HIGH (ref 0.44–1.00)
GFR, Estimated: 55 mL/min — ABNORMAL LOW (ref >60)
Glucose, Bld: 186 mg/dL — ABNORMAL HIGH (ref 70–99)
Potassium: 4.3 mmol/L (ref 3.5–5.1)
Sodium: 140 mmol/L (ref 135–145)

## 2022-07-13 ENCOUNTER — Encounter: Payer: Self-pay | Admitting: Cardiovascular Disease

## 2022-07-14 ENCOUNTER — Telehealth: Payer: Self-pay | Admitting: Cardiovascular Disease

## 2022-07-14 ENCOUNTER — Encounter: Payer: Self-pay | Admitting: Cardiovascular Disease

## 2022-07-14 MED ORDER — DILTIAZEM HCL ER COATED BEADS 240 MG PO CP24: 240.0000 mg | ORAL_CAPSULE | Freq: Every day | ORAL | 3 refills | Status: DC

## 2022-07-14 NOTE — Telephone Encounter (Signed)
Spoke with patient and reviewed provider recommendations. She verbalized understanding and reports her breathing is bad. Encouraged her to call pulmonary to set up appointment. She has one next week and will see if they have something sooner. She verbalized understanding with no further questions at this time. Medication list updated.

## 2022-07-14 NOTE — Telephone Encounter (Signed)
error 

## 2022-07-14 NOTE — Telephone Encounter (Signed)
   Pt is calling to f/u her mychart message sent yesterday. Her new medicine is causing her swelling and sob

## 2022-07-14 NOTE — Telephone Encounter (Signed)
Newly documented intolerance to bisoprolol is noted.  Now with documented intolerance of "limbs swelling" as well as shortness of breath to metoprolol and bisoprolol.  Given documented intolerance with separate beta-blocker, I will avoid challenging her with carvedilol.  I favor avoiding diltiazem given her cardiomyopathy.  Given we have no other options, okay to resume prior dose of diltiazem, although this is not ideal.

## 2022-07-14 NOTE — Telephone Encounter (Signed)
Spoke with patient and she reports since switching to Bisoprolol she has had problems. Symptoms as follows:  Legs swollen Shortness of breath Chest beating fast Severe weakness  She states that this all began after switching to bisoprolol. She feels like she did much better on the diltiazem. When on that she stated heart was not as involved. She held her dose this morning. She does not have any blood pressure readings or heart rates. Advised I would forward this note over to the provider for his review and will call her back with any recommendations.

## 2022-07-14 NOTE — Telephone Encounter (Signed)
See telephone encounter.

## 2022-07-16 ENCOUNTER — Encounter: Payer: Self-pay | Admitting: Physician Assistant

## 2022-07-17 ENCOUNTER — Telehealth: Payer: Self-pay

## 2022-07-17 ENCOUNTER — Other Ambulatory Visit: Payer: Self-pay

## 2022-07-17 MED ORDER — LEVOFLOXACIN 500 MG PO TABS: 500.0000 mg | ORAL_TABLET | Freq: Every day | ORAL | 0 refills | Status: DC

## 2022-07-17 NOTE — Telephone Encounter (Signed)
Pt called and send my chart message that left leg has been swollen badly for 3 days at least. It's a little warm very red it was purple like bruised skin then turned red. About a week ago 2 places on the back split open and bled not healing well either. Foot so swollen it hurts to walk on it.so she is  worried about cellulitis and  also have had a low grade cough and some wheezing as per dr Humphrey Rolls and lauren advised her to go ED pt refused to go to ED I offer her appt today in morning she unable to come in due to ride as per lauren advised her that we send Levaquin  if her leg and her symptoms getting worse go to ED

## 2022-07-21 ENCOUNTER — Encounter: Payer: Self-pay | Admitting: Physician Assistant

## 2022-07-21 ENCOUNTER — Ambulatory Visit (INDEPENDENT_AMBULATORY_CARE_PROVIDER_SITE_OTHER): Payer: Medicare Other | Admitting: Physician Assistant

## 2022-07-21 VITALS — BP 122/77 | HR 85 | Temp 98.0°F | Resp 16 | Ht 64.0 in | Wt 253.8 lb

## 2022-07-21 DIAGNOSIS — R0602 Shortness of breath: Secondary | ICD-10-CM

## 2022-07-21 DIAGNOSIS — J449 Chronic obstructive pulmonary disease, unspecified: Secondary | ICD-10-CM | POA: Diagnosis not present

## 2022-07-21 DIAGNOSIS — I5022 Chronic systolic (congestive) heart failure: Secondary | ICD-10-CM | POA: Diagnosis not present

## 2022-07-21 DIAGNOSIS — L03116 Cellulitis of left lower limb: Secondary | ICD-10-CM | POA: Diagnosis not present

## 2022-07-21 NOTE — Progress Notes (Unsigned)
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 pulmonary disease, unspecified COPD type ([place]) Continue current medications as prescribed  2. SOB (shortness of breath) - Spirometry with Graph is normal  3. Chronic systolic heart failure ([place]) Followed by cardiology  4. Cellulitis of left lower leg Per pt it is improving since  starting Levaquin and will complete ABX and monitor symptoms. If acute worsening or new symptoms will call or go to ED.   General Counseling: I have discussed the findings of the evaluation and examination with  [name].  I have also discussed any further diagnostic evaluation thatmay be needed or ordered today. Elisavet verbalizes understanding of the findings of todays visit. We also reviewed her medications today and discussed drug interactions and side effects including but not limited excessive drowsiness and altered mental states. We also discussed that there is always a risk not just to her but also people around her. she has been encouraged to call the office with any questions or concerns that should arise related to todays visit.  Orders Placed This Encounter  Procedures   Spirometry with Graph    Order Specific Question:   Where should this test be performed?    Answer:   [facility]     Time spent: 38  I have personally obtained a history, examined the patient, evaluated laboratory and imaging results, formulated the assessment and plan and placed orders. This patient was seen by Drema Dallas, PA-C in collaboration with Dr. Devona Konig as a part of collaborative care agreement.     Allyne Gee, MD [name] Pulmonary and Critical Care Sleep medicine

## 2022-07-22 NOTE — Patient Instructions (Signed)
Chronic Obstructive Pulmonary Disease  Chronic obstructive pulmonary disease (COPD) is a long-term (chronic) lung problem. When you have COPD, it is hard for air to get in and out of your lungs. Usually the condition gets worse over time, and your lungs will never return to normal. There are things you can do to keep yourself as healthy as possible. What are the causes? Smoking. This is the most common cause. Certain genes passed from parent to child (inherited). What increases the risk? Being exposed to secondhand smoke from cigarettes, pipes, or cigars. Being exposed to chemicals and other irritants, such as fumes and dust in the work environment. Having chronic lung conditions or infections. What are the signs or symptoms? Shortness of breath, especially during physical activity. A long-term cough with a large amount of thick mucus. Sometimes, the cough may not have any mucus (dry cough). Wheezing. Breathing quickly. Skin that looks gray or blue, especially in the fingers, toes, or lips. Feeling tired (fatigue). Weight loss. Chest tightness. Having infections often. Episodes when breathing symptoms become much worse (exacerbations). At the later stages of this disease, you may have swelling in the ankles, feet, or legs. How is this treated? Taking medicines. Quitting smoking, if you smoke. Rehabilitation. This includes steps to make your body work better. It may involve a team of specialists. Doing exercises. Making changes to your diet. Using oxygen. Lung surgery. Lung transplant. Comfort measures (palliative care). Follow these instructions at home: Medicines Take over-the-counter and prescription medicines only as told by your doctor. Talk to your doctor before taking any cough or allergy medicines. You may need to avoid medicines that cause your lungs to be dry. Lifestyle If you smoke, stop smoking. Smoking makes the problem worse. Do not smoke or use any products that  contain nicotine or tobacco. If you need help quitting, ask your doctor. Avoid being around things that make your breathing worse. This may include smoke, chemicals, and fumes. Stay active, but remember to rest as well. Learn and use tips on how to manage stress and control your breathing. Make sure you get enough sleep. Most adults need at least 7 hours of sleep every night. Eat healthy foods. Eat smaller meals more often. Rest before meals. Controlled breathing Learn and use tips on how to control your breathing as told by your doctor. Try: Breathing in (inhaling) through your nose for 1 second. Then, pucker your lips and breath out (exhale) through your lips for 2 seconds. Putting one hand on your belly (abdomen). Breathe in slowly through your nose for 1 second. Your hand on your belly should move out. Pucker your lips and breathe out slowly through your lips. Your hand on your belly should move in as you breathe out.  Controlled coughing Learn and use controlled coughing to clear mucus from your lungs. Follow these steps: Lean your head a little forward. Breathe in deeply. Try to hold your breath for 3 seconds. Keep your mouth slightly open while coughing 2 times. Spit any mucus out into a tissue. Rest and do the steps again 1 or 2 times as needed. General instructions Make sure you get all the shots (vaccines) that your doctor recommends. Ask your doctor about a flu shot and a pneumonia shot. Use oxygen therapy and pulmonary rehabilitation if told by your doctor. If you need home oxygen therapy, ask your doctor if you should buy a tool to measure your oxygen level (oximeter). Make a COPD action plan with your doctor. This helps you   to know what to do if you feel worse than usual. Manage any other conditions you have as told by your doctor. Avoid going outside when it is very hot, cold, or humid. Avoid people who have a sickness you can catch (contagious). Keep all follow-up  visits. Contact a doctor if: You cough up more mucus than usual. There is a change in the color or thickness of the mucus. It is harder to breathe than usual. Your breathing is faster than usual. You have trouble sleeping. You need to use your medicines more often than usual. You have trouble doing your normal activities such as getting dressed or walking around the house. Get help right away if: You have shortness of breath while resting. You have shortness of breath that stops you from: Being able to talk. Doing normal activities. Your chest hurts for longer than 5 minutes. Your skin color is more blue than usual. Your pulse oximeter shows that you have low oxygen for longer than 5 minutes. You have a fever. You feel too tired to breathe normally. These symptoms may represent a serious problem that is an emergency. Do not wait to see if the symptoms will go away. Get medical help right away. Call your local emergency services (911 in the [place] Do not drive yourself to the hospital. Summary Chronic obstructive pulmonary disease (COPD) is a long-term lung problem. The way your lungs work will never return to normal. Usually the condition gets worse over time. There are things you can do to keep yourself as healthy as possible. Take over-the-counter and prescription medicines only as told by your doctor. If you smoke, stop. Smoking makes the problem worse. This information is not intended to replace advice given to you by your health care provider. Make sure you discuss any questions you have with your health care provider. Document Revised: 10/16/2020 Document Reviewed: 10/16/2020 [employer] Patient Education  2023 [employer]  

## 2022-07-26 ENCOUNTER — Other Ambulatory Visit: Payer: Self-pay | Admitting: Gastroenterology

## 2022-07-28 DIAGNOSIS — H43813 Vitreous degeneration, bilateral: Secondary | ICD-10-CM | POA: Diagnosis not present

## 2022-07-28 DIAGNOSIS — R7309 Other abnormal glucose: Secondary | ICD-10-CM | POA: Diagnosis not present

## 2022-07-28 DIAGNOSIS — Z961 Presence of intraocular lens: Secondary | ICD-10-CM | POA: Diagnosis not present

## 2022-08-04 ENCOUNTER — Ambulatory Visit (INDEPENDENT_AMBULATORY_CARE_PROVIDER_SITE_OTHER): Payer: Medicare Other | Admitting: Nurse Practitioner

## 2022-08-04 ENCOUNTER — Encounter: Payer: Self-pay | Admitting: Nurse Practitioner

## 2022-08-04 VITALS — BP 118/61 | HR 76 | Temp 98.1°F | Resp 16 | Ht 64.0 in | Wt 254.6 lb

## 2022-08-04 DIAGNOSIS — Z760 Encounter for issue of repeat prescription: Secondary | ICD-10-CM | POA: Diagnosis not present

## 2022-08-04 DIAGNOSIS — R300 Dysuria: Secondary | ICD-10-CM

## 2022-08-04 DIAGNOSIS — Z1231 Encounter for screening mammogram for malignant neoplasm of breast: Secondary | ICD-10-CM

## 2022-08-04 DIAGNOSIS — R7303 Prediabetes: Secondary | ICD-10-CM | POA: Diagnosis not present

## 2022-08-04 DIAGNOSIS — Z0001 Encounter for general adult medical examination with abnormal findings: Secondary | ICD-10-CM | POA: Diagnosis not present

## 2022-08-04 DIAGNOSIS — K2100 Gastro-esophageal reflux disease with esophagitis, without bleeding: Secondary | ICD-10-CM

## 2022-08-04 DIAGNOSIS — I5022 Chronic systolic (congestive) heart failure: Secondary | ICD-10-CM | POA: Diagnosis not present

## 2022-08-04 DIAGNOSIS — F411 Generalized anxiety disorder: Secondary | ICD-10-CM

## 2022-08-04 DIAGNOSIS — I890 Lymphedema, not elsewhere classified: Secondary | ICD-10-CM | POA: Diagnosis not present

## 2022-08-04 DIAGNOSIS — Z5181 Encounter for therapeutic drug level monitoring: Secondary | ICD-10-CM

## 2022-08-04 DIAGNOSIS — I4891 Unspecified atrial fibrillation: Secondary | ICD-10-CM

## 2022-08-04 DIAGNOSIS — J441 Chronic obstructive pulmonary disease with (acute) exacerbation: Secondary | ICD-10-CM

## 2022-08-04 DIAGNOSIS — Z79899 Other long term (current) drug therapy: Secondary | ICD-10-CM

## 2022-08-04 DIAGNOSIS — E782 Mixed hyperlipidemia: Secondary | ICD-10-CM

## 2022-08-04 DIAGNOSIS — I10 Essential (primary) hypertension: Secondary | ICD-10-CM

## 2022-08-04 DIAGNOSIS — Z780 Asymptomatic menopausal state: Secondary | ICD-10-CM

## 2022-08-04 DIAGNOSIS — I6523 Occlusion and stenosis of bilateral carotid arteries: Secondary | ICD-10-CM

## 2022-08-04 MED ORDER — ALPRAZOLAM 0.25 MG PO TABS: 0.2500 mg | ORAL_TABLET | Freq: Two times a day (BID) | ORAL | 2 refills | Status: DC | PRN

## 2022-08-04 MED ORDER — MONTELUKAST SODIUM 10 MG PO TABS: 10.0000 mg | ORAL_TABLET | Freq: Every day | ORAL | 1 refills | Status: DC

## 2022-08-04 MED ORDER — TETANUS-DIPHTH-ACELL PERTUSSIS 5-2-15.5 LF-MCG/0.5 IM SUSP: 0.5000 mL | Freq: Once | INTRAMUSCULAR | 0 refills | Status: AC
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
## 2022-08-07 LAB — MICROSCOPIC EXAMINATION
Bacteria, UA: NONE SEEN
Casts: NONE SEEN /lpf
Epithelial Cells (non renal): >10 /hpf — AB (ref 0–10)

## 2022-08-07 LAB — UA/M W/RFLX CULTURE, ROUTINE
Bilirubin, UA: NEGATIVE
Glucose, UA: NEGATIVE
Nitrite, UA: NEGATIVE
RBC, UA: NEGATIVE
Specific Gravity, UA: >=1.03 — AB (ref 1.005–1.030)
Urobilinogen, Ur: 1 mg/dL (ref 0.2–1.0)
pH, UA: 5 (ref 5.0–7.5)

## 2022-08-07 LAB — URINE CULTURE, REFLEX

## 2022-08-11 ENCOUNTER — Ambulatory Visit: Payer: Medicare Other | Admitting: Nurse Practitioner

## 2022-08-12 ENCOUNTER — Encounter: Payer: Self-pay | Admitting: Nurse Practitioner

## 2022-08-13 ENCOUNTER — Encounter: Payer: Self-pay | Admitting: Nurse Practitioner

## 2022-08-17 ENCOUNTER — Encounter (INDEPENDENT_AMBULATORY_CARE_PROVIDER_SITE_OTHER): Payer: Self-pay | Admitting: Vascular Surgery

## 2022-08-19 ENCOUNTER — Ambulatory Visit: Payer: Medicare Other | Attending: Cardiovascular Disease | Admitting: Cardiovascular Disease

## 2022-08-19 ENCOUNTER — Encounter: Payer: Self-pay | Admitting: Cardiovascular Disease

## 2022-08-19 VITALS — BP 136/70 | HR 108 | Ht 64.5 in | Wt 257.0 lb

## 2022-08-19 DIAGNOSIS — E785 Hyperlipidemia, unspecified: Secondary | ICD-10-CM | POA: Diagnosis not present

## 2022-08-19 DIAGNOSIS — I5020 Unspecified systolic (congestive) heart failure: Secondary | ICD-10-CM | POA: Diagnosis not present

## 2022-08-19 DIAGNOSIS — I4821 Permanent atrial fibrillation: Secondary | ICD-10-CM | POA: Insufficient documentation

## 2022-08-19 DIAGNOSIS — I10 Essential (primary) hypertension: Secondary | ICD-10-CM | POA: Diagnosis not present

## 2022-08-19 DIAGNOSIS — I2510 Atherosclerotic heart disease of native coronary artery without angina pectoris: Secondary | ICD-10-CM | POA: Diagnosis not present

## 2022-08-19 DIAGNOSIS — I6523 Occlusion and stenosis of bilateral carotid arteries: Secondary | ICD-10-CM | POA: Diagnosis not present

## 2022-08-19 NOTE — Patient Instructions (Signed)
Medication Instructions:   NONE  *If you need a refill on your cardiac medications before your next appointment, please call your pharmacy*   Lab Work:  NONE  If you have labs (blood work) drawn today and your tests are completely normal, you will receive your results only by: [place] (if you have MyChart) OR A paper copy in the mail If you have any lab test that is abnormal or we need to change your treatment, we will call you to review the results.   Testing/Procedures:  NONE   Follow-Up: At [facility], you and your health needs are our priority.  As part of our continuing mission to provide you with exceptional heart care, we have created designated Provider Care Teams.  These Care Teams include your primary Cardiologist (physician) and Advanced Practice Providers (APPs -  Physician Assistants and Nurse Practitioners) who all work together to provide you with the care you need, when you need it.  We recommend signing up for the patient portal called "MyChart".  Sign up information is provided on this After Visit Summary.  MyChart is used to connect with patients for Virtual Visits (Telemedicine).  Patients are able to view lab/test results, encounter notes, upcoming appointments, etc.  Non-urgent messages can be sent to your provider as well.   To learn more about what you can do with MyChart, go to [email or web address].    Your next appointment:   4 month(s)  The format for your next appointment:   In Person  Provider:   You may see Kathlyn Sacramento, MD or one of the following Advanced Practice Providers on your designated Care Team:   Murray Hodgkins, NP Christell Faith, PA-C Cadence Kathlen Mody, PA-C Gerrie Nordmann, NP   Important Information About Sugar

## 2022-08-19 NOTE — Progress Notes (Signed)
Cardiology Office Note   Date:  08/19/2022   ID:  Laura, [name] September 01, 1951, MRN [id number]  PCP:  Laura Osgood, NP  Cardiologist:   Laura Sacramento, MD   No chief complaint on file.      History of Present Illness: Laura [name] is a 71 y.o. female who is here today for a follow-up visit regarding chronic atrial fibrillation and chronic systolic heart failure.  The patient has chronic medical conditions that include obesity, sleep apnea on CPAP, asthma, chronic systolic heart failure due to nonischemic cardiomyopathy, nonobstructive CAD, CVA and essential hypertension. She was diagnosed with atrial fibrillation in October 2017.She did not tolerate multiple antiarrhythmic medications and has been treated with rate control. She was hospitalized in September of 2020 with ischemic colitis.  Angiogram showed no acute obstructive disease.  She underwent ileum and right colon resection.    She did not tolerate metoprolol due to increased dyspnea.  She underwent a right and left cardiac catheterization in March 2021 to evaluate pulmonary hypertension and exertional dyspnea.  It showed mild nonobstructive coronary artery disease.  Right heart catheterization showed moderately elevated filling pressures with mild pulmonary hypertension and normal cardiac output.  Her pulmonary wedge pressure at that time was 24 mmHg.  Furosemide was resumed at that time.  She was hospitalized in November of 2021 with acute right MCA stroke in the setting of being off anticoagulation for lumbar injection.  Echocardiogram during that admission showed an EF of 45 to 50%. She had worsening shortness of breath recently and thus I repeated her echocardiogram which showed an EF of 40 to 45% with moderate mitral regurgitation and small ASD with left-to-right shunt.  Right ventricle was normal in size with normal function.  She struggles with bilateral leg edema worse on the left side due to suspected  lymphedema.  She was seen last month by Laura [name] and was switched from diltiazem to bisoprolol.  However, she did not tolerate the medication due to increased shortness of breath and she went back on diltiazem.  She denies chest pain.  She takes her medications regularly.   Past Medical History:  Diagnosis Date   Asthma    Chronic atrial fibrillation ([place])    a. diagnosed in 09/2016; b. failed flecainide and propafenone due to LE swelling and SOB, could not afford Multaq; c. CHADS2VASc => 5 (CHF, HTN, age x 1, nonobs CAD, female)--> Eliquis   COPD (chronic obstructive pulmonary disease) (H[facility])    GERD (gastroesophageal reflux disease)    HFmrEF (heart failure with mid-range ejection fraction) ([place])    a. 12/2019 Echo: EF 40-45%.   Hyperlipidemia    Hypertension    NICM (nonischemic cardiomyopathy) ([place])    a. 12/2019 Echo: EF 40-45%, glob HK, mildly reduced RV fxn, sev dil LA. *HR 130 (afib) during study.   Nonobstructive CAD (coronary artery disease)    a. Lexiscan Myoview 10/2016: no evidence of ischemia, EF 53%; b. 02/2020 Cath: LM nl, LAD 20p, 18m LCX 20ost, OM1/2/3 nl, LPDA nl, LPL1/2 nl, LPAV nl, RCA small, nl.   Obesity    Obstructive sleep apnea    Pulmonary [place]    [place] ap[place]    [place]    Systolic dysfunction    a. TTE 10/2016: EF 50%, mild LVH, moderately dilated LA, moderate MR/TR, mild pulmonary hypertension    Past Surgical History:  Procedure Laterality Date   BOWEL RESECTION  09/11/2019   Procedure: SMALL BOWEL RESECTION;  Surgeon: Herbert Pun, MD;  Location: [facility] ORS;  Service: General;;   CARDIAC CATHETERIZATION     cataract surgery     COLONOSCOPY WITH PROPOFOL N/A 03/19/2020   Procedure: COLONOSCOPY WITH PROPOFOL;  Surgeon: [name], MD;  Location: [facility] ENDOSCOPY;  Service: Gastroenterology;  Laterality: N/A;   CORONARY ANGIOPLASTY     INCISION AND DRAINAGE ABSCESS Right 06/29/2016   Procedure: INCISION AND DRAINAGE ABSCESS;   Surgeon: Florene Glen, MD;  Location: [facility] ORS;  Service: General;  Laterality: Right;   INCISION AND DRAINAGE OF WOUND Left 06/29/2016   Procedure: IRRIGATION AND DEBRIDEMENT WOUND;  Surgeon: Florene Glen, MD;  Location: [facility] ORS;  Service: General;  Laterality: Left;   IR ANGIO VERTEBRAL SEL SUBCLAVIAN INNOMINATE UNI R MOD SED  11/20/2020   IR CT HEAD LTD  11/20/2020   IR PERCUTANEOUS ART THROMBECTOMY/INFUSION INTRACRANIAL INC DIAG ANGIO  11/20/2020   LAPAROSCOPIC RIGHT COLECTOMY  09/11/2019   Procedure: RIGHT COLECTOMY;  Surgeon: Herbert Pun, MD;  Location: [facility] ORS;  Service: General;;   LAPAROSCOPY N/A 09/11/2019   Procedure: LAPAROSCOPY DIAGNOSTIC;  Surgeon: Herbert Pun, MD;  Location: [facility] ORS;  Service: General;  Laterality: N/A;   LAPAROTOMY N/A 09/13/2019   Procedure: REOPENING OF RECENT LAPAROTOMYANASTOMOSIS OF BOWEL;  Surgeon: Herbert Pun, MD;  Location: [facility] ORS;  Service: General;  Laterality: N/A;   RADIOLOGY WITH ANESTHESIA N/A 11/20/2020   Procedure: IR WITH ANESTHESIA - CODE STROKE;  Surgeon: Radiologist, Medication, MD;  Location: [place];  Service: Radiology;  Laterality: N/A;   RIGHT/LEFT HEART CATH AND CORONARY ANGIOGRAPHY Bilateral 02/27/2020   Procedure: RIGHT/LEFT HEART CATH AND CORONARY ANGIOGRAPHY;  Surgeon: Wellington Hampshire, MD;  Location: [place] [facility];  Service: Cardiovascular;  Laterality: Bilateral;   VISCERAL ANGIOGRAPHY N/A 09/12/2019   Procedure: VISCERAL ANGIOGRAPHY;  Surgeon: Algernon Huxley, MD;  Location: [place] [facility];  Service: Cardiovascular;  Laterality: N/A;     Current Outpatient Medications  Medication Sig Dispense Refill   Accu-Chek Softclix Lancets lancets Accu-Chek Softclix Lancets  USE AS INSTRUCTED TO CHECK BLOOD SUGARS DAILY 2 HOURS AFTER MEAL     albuterol (VENTOLIN HFA) 108 (90 Base) MCG/ACT inhaler Inhale 2 puffs into the lungs every 4 (four) hours as needed for wheezing or shortness of breath. 18  g 3   ALPRAZolam (XANAX) 0.25 MG tablet Take 1 tablet (0.25 mg total) by mouth 2 (two) times daily as needed for anxiety. 30 tablet 2   diltiazem (CARDIZEM CD) 240 MG 24 hr capsule Take 1 capsule (240 mg total) by mouth daily. 90 capsule 3   ELIQUIS 5 MG TABS tablet TAKE 1 TABLET BY MOUTH TWICE A DAY 60 tablet 5   EPINEPHrine 0.3 mg/0.3 mL IJ SOAJ injection Inject 0.3 mg into the muscle as needed for anaphylaxis.      fluticasone (FLONASE) 50 MCG/ACT nasal spray Place into both nostrils daily.     furosemide (LASIX) 20 MG tablet Take 1 tablet (20 mg total) by mouth daily.     glucose blood (ACCU-CHEK GUIDE) test strip Use as instructed to check blood sugars daily 2 hours after meal DX E11.65 100 each 12   ipratropium-albuterol (DUONEB) 0.5-2.5 (3) MG/3ML SOLN USE 1 VIAL IN NEBULIZER EVERY 6 HOURS - and as needed 120 mL 11   levofloxacin (LEVAQUIN) 500 MG tablet Take 1 tablet (500 mg total) by mouth daily. 10 tablet 0   loperamide (IMODIUM) 2 MG capsule Take 4 mg by mouth as needed  for diarrhea or loose stools.     methocarbamol (ROBAXIN) 500 MG tablet Take 1 tablet by mouth 3 (three) times daily as needed.     montelukast (SINGULAIR) 10 MG tablet Take 1 tablet (10 mg total) by mouth daily. 90 tablet 1   omeprazole (PRILOSEC) 40 MG capsule TAKE 1 CAPSULE (40 MG TOTAL) BY MOUTH DAILY. 90 capsule 3   ondansetron (ZOFRAN) 4 MG tablet Take 1 tablet (4 mg total) by mouth every 8 (eight) hours as needed for nausea or vomiting. 60 tablet 1   predniSONE (DELTASONE) 10 MG tablet TAKE 1 TABLET (10 MG TOTAL) BY MOUTH DAILY WITH BREAKFAST. 90 tablet 1   predniSONE (DELTASONE) 10 MG tablet Take one tab 3 x day for 3 days, then take one tab 2 x a day for 3 days and then take one tab a day for 3 days for copd 18 tablet 0   Probiotic Product (PROBIOTIC-10) CAPS Take 1 capsule by mouth daily.      promethazine (PHENERGAN) 12.5 MG tablet TAKE 1 TABLET (12.5 MG TOTAL) BY MOUTH EVERY 12 (TWELVE) HOURS AS NEEDED FOR  NAUSEA OR VOMITING. 30 tablet 0   rosuvastatin (CRESTOR) 20 MG tablet TAKE 1 TABLET BY MOUTH EVERY DAY 90 tablet 3   sacubitril-valsartan (ENTRESTO) 24-26 MG Take 1 tablet by mouth 2 (two) times daily. 60 tablet 5   spironolactone (ALDACTONE) 25 MG tablet Take 1 tablet (25 mg total) by mouth daily. 90 tablet 3   THEO-24 100 MG 24 hr capsule TAKE 1 CAPSULE BY MOUTH EVERY DAY 30 capsule 3   traMADol (ULTRAM) 50 MG tablet Take 50 mg by mouth 4 (four) times daily as needed.     No current facility-administered medications for this visit.    Allergies:   Flecainide, Metoprolol, Propafenone, and Rivaroxaban    Social History:  The patient  reports that she has never smoked. She has never used smokeless tobacco. She reports that she does not drink alcohol and does not use drugs.   Family History:  The patient's family history includes COPD in her father; Cancer in her daughter; Dementia in her mother; Heart disease in her brother; Osteoporosis in her mother; Vascular Disease in her mother.    ROS:  Please see the history of present illness.   Otherwise, review of systems are positive for none.   All other systems are reviewed and negative.    PHYSICAL EXAM: VS:  BP 136/70   Pulse (!) 108   Ht 5' 4.5" (1.638 m)   Wt 257 lb (116.6 kg)   LMP  (LMP Unknown)   SpO2 98%   BMI 43.43 kg/m  , BMI Body mass index is 43.43 kg/m. GEN: Well nourished, well developed, in no acute distress  HEENT: normal  Neck: no JVD, carotid bruits, or masses Cardiac: Irregularly irregular ; no murmurs, rubs, or gallops, mild/moderate bilateral leg edema worse on the left side Respiratory:  clear to auscultation bilaterally, normal work of breathing GI: soft, nontender, nondistended, + BS MS: no deformity or atrophy  Skin: warm and dry, no rash Neuro:  Strength and sensation are intact Psych: euthymic mood, full affect Radial pulses normal   EKG:  EKG is ordered today. The ekg ordered today demonstrates  atrial fibrillation with ventricular rate of 108 bpm with 2 PVCs low voltage.   Recent Labs: 01/03/2022: ALT 23; Hemoglobin 15.1; Platelets 296 05/16/2022: B Natriuretic Peptide 227.5 07/11/2022: BUN 23; Creatinine, Ser 1.08; Potassium 4.3; Sodium 140  Lipid Panel    Component Value Date/Time   CHOL 143 06/12/2021 1028   TRIG 140 06/12/2021 1028   HDL 73 06/12/2021 1028   CHOLHDL 2.0 06/12/2021 1028   CHOLHDL 3.3 11/21/2020 0616   VLDL 25 11/21/2020 0616   LDLCALC 47 06/12/2021 1028      Wt Readings from Last 3 Encounters:  08/19/22 257 lb (116.6 kg)  08/04/22 254 lb 9.6 oz (115.5 kg)  07/21/22 253 lb 12.8 oz (115.1 kg)         04/03/2017   11:07 AM  PAD Screen  Previous PAD dx? No  Previous surgical procedure? No  Pain with walking? No  Feet/toe relief with dangling? No  Painful, non-healing ulcers? No  Extremities discolored? No      ASSESSMENT AND PLAN:  1.  Nonobstructive coronary artery disease: No chest pain.  Continue medical therapy.  2.  Chronic atrial fibrillation: She is mildly tachycardic today.  She did not tolerate beta-blockers including metoprolol or bisoprolol.  Continue treatment with diltiazem for now.  If ventricular rate remains high, will consider increasing the dose or adding digoxin.   Continue anticoagulation with Eliquis.  3. Sleep apnea: Continue treatment with CPAP.  4. Essential hypertension: Blood pressure controlled on current medications.  5.  Chronic systolic heart failure: Most recent echocardiogram showed an EF of 40 to 45%.  Continue Entresto and spironolactone.   6.  History of CVA: Embolic in the setting of being off Eliquis.  Any future interruption of Eliquis will require bridging with low molecular weight heparin.  7.  Hyperlipidemia: Most recent lipid profile showed an LDL of 47.  Continue rosuvastatin 20 mg daily.      Disposition:   FU in 4 months. Signed,  Laura Sacramento, MD  08/19/2022 5:36 PM    [place]

## 2022-08-20 ENCOUNTER — Other Ambulatory Visit: Payer: Self-pay | Admitting: Cardiovascular Disease

## 2022-08-20 DIAGNOSIS — I5022 Chronic systolic (congestive) heart failure: Secondary | ICD-10-CM

## 2022-08-21 ENCOUNTER — Other Ambulatory Visit: Payer: Self-pay

## 2022-08-21 ENCOUNTER — Encounter: Payer: Self-pay | Admitting: Nurse Practitioner

## 2022-08-21 MED ORDER — PROMETHAZINE HCL 12.5 MG PO TABS: 12.5000 mg | ORAL_TABLET | Freq: Two times a day (BID) | ORAL | 2 refills | Status: DC | PRN

## 2022-08-29 ENCOUNTER — Telehealth: Payer: Self-pay | Admitting: Nurse Practitioner

## 2022-08-29 NOTE — Telephone Encounter (Signed)
Physical Therapy referral faxed to [facility]

## 2022-09-02 ENCOUNTER — Other Ambulatory Visit: Payer: Self-pay | Admitting: Cardiovascular Disease

## 2022-09-03 NOTE — Telephone Encounter (Signed)
Refill Request.  

## 2022-09-03 NOTE — Telephone Encounter (Signed)
Prescription refill request for Eliquis received. Indication:  AF Last office visit: 08/19/22  Rod Can MD Scr:  1.08 on 07/11/22 Age: 71 Weight: 116.6kg  Based on above findings Eliquis '5mg'$  twice daily is the appropriate dose.  Refill approved.

## 2022-10-12 ENCOUNTER — Other Ambulatory Visit: Payer: Self-pay | Admitting: Nurse Practitioner

## 2022-10-12 DIAGNOSIS — J441 Chronic obstructive pulmonary disease with (acute) exacerbation: Secondary | ICD-10-CM

## 2022-10-20 ENCOUNTER — Encounter (INDEPENDENT_AMBULATORY_CARE_PROVIDER_SITE_OTHER): Payer: Self-pay

## 2022-10-23 DIAGNOSIS — E785 Hyperlipidemia, unspecified: Secondary | ICD-10-CM | POA: Diagnosis not present

## 2022-10-23 DIAGNOSIS — Z20822 Contact with and (suspected) exposure to covid-19: Secondary | ICD-10-CM | POA: Diagnosis not present

## 2022-10-23 DIAGNOSIS — I4891 Unspecified atrial fibrillation: Secondary | ICD-10-CM | POA: Diagnosis not present

## 2022-10-23 DIAGNOSIS — Z8673 Personal history of transient ischemic attack (TIA), and cerebral infarction without residual deficits: Secondary | ICD-10-CM | POA: Diagnosis not present

## 2022-10-23 DIAGNOSIS — K219 Gastro-esophageal reflux disease without esophagitis: Secondary | ICD-10-CM | POA: Diagnosis not present

## 2022-10-23 DIAGNOSIS — R531 Weakness: Secondary | ICD-10-CM | POA: Diagnosis not present

## 2022-10-23 DIAGNOSIS — I110 Hypertensive heart disease with heart failure: Secondary | ICD-10-CM | POA: Diagnosis not present

## 2022-10-23 DIAGNOSIS — Z7901 Long term (current) use of anticoagulants: Secondary | ICD-10-CM | POA: Diagnosis not present

## 2022-10-23 DIAGNOSIS — Z1152 Encounter for screening for COVID-19: Secondary | ICD-10-CM | POA: Diagnosis not present

## 2022-10-23 DIAGNOSIS — J449 Chronic obstructive pulmonary disease, unspecified: Secondary | ICD-10-CM | POA: Diagnosis not present

## 2022-10-23 DIAGNOSIS — R110 Nausea: Secondary | ICD-10-CM | POA: Diagnosis not present

## 2022-10-23 DIAGNOSIS — I5022 Chronic systolic (congestive) heart failure: Secondary | ICD-10-CM | POA: Diagnosis not present

## 2022-10-23 DIAGNOSIS — R519 Headache, unspecified: Secondary | ICD-10-CM | POA: Diagnosis not present

## 2022-10-24 ENCOUNTER — Telehealth: Payer: Self-pay | Admitting: Nurse Practitioner

## 2022-10-24 DIAGNOSIS — R531 Weakness: Secondary | ICD-10-CM | POA: Diagnosis not present

## 2022-10-24 NOTE — Telephone Encounter (Signed)
Attempted to contact patient for ED follow up. No answer, vm not set up-Laura [name]

## 2022-10-28 ENCOUNTER — Ambulatory Visit (INDEPENDENT_AMBULATORY_CARE_PROVIDER_SITE_OTHER): Payer: Medicare Other | Admitting: Nurse Practitioner

## 2022-10-28 ENCOUNTER — Encounter: Payer: Self-pay | Admitting: Nurse Practitioner

## 2022-10-28 VITALS — BP 129/76 | HR 87 | Temp 97.0°F | Resp 16 | Ht 64.5 in | Wt 257.4 lb

## 2022-10-28 DIAGNOSIS — R600 Localized edema: Secondary | ICD-10-CM

## 2022-10-28 DIAGNOSIS — E860 Dehydration: Secondary | ICD-10-CM

## 2022-10-28 DIAGNOSIS — E538 Deficiency of other specified B group vitamins: Secondary | ICD-10-CM | POA: Diagnosis not present

## 2022-10-28 MED ORDER — CYANOCOBALAMIN 1000 MCG/ML IJ SOLN
1000.0000 ug | Freq: Once | INTRAMUSCULAR | Status: AC
  Administered 2022-10-28: 1000 ug via INTRAMUSCULAR
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
## 2022-10-30 ENCOUNTER — Encounter: Payer: Self-pay | Admitting: Cardiovascular Disease

## 2022-10-31 ENCOUNTER — Ambulatory Visit: Payer: Medicare Other

## 2022-10-31 DIAGNOSIS — E860 Dehydration: Secondary | ICD-10-CM

## 2022-10-31 DIAGNOSIS — R531 Weakness: Secondary | ICD-10-CM

## 2022-10-31 NOTE — Progress Notes (Signed)
Pt was just here Blood draw

## 2022-11-01 LAB — CMP14+EGFR
ALT: 13 IU/L (ref 0–32)
AST: 14 IU/L (ref 0–40)
Albumin/Globulin Ratio: 1.8 (ref 1.2–2.2)
Albumin: 4.2 g/dL (ref 3.8–4.8)
Alkaline Phosphatase: 59 IU/L (ref 44–121)
BUN/Creatinine Ratio: 15 (ref 12–28)
BUN: 16 mg/dL (ref 8–27)
Bilirubin Total: 0.5 mg/dL (ref 0.0–1.2)
CO2: 20 mmol/L (ref 20–29)
Calcium: 9.6 mg/dL (ref 8.7–10.3)
Chloride: 105 mmol/L (ref 96–106)
Creatinine, Ser: 1.06 mg/dL — ABNORMAL HIGH (ref 0.57–1.00)
Globulin, Total: 2.4 g/dL (ref 1.5–4.5)
Glucose: 126 mg/dL — ABNORMAL HIGH (ref 70–99)
Potassium: 4.3 mmol/L (ref 3.5–5.2)
Sodium: 145 mmol/L — ABNORMAL HIGH (ref 134–144)
Total Protein: 6.6 g/dL (ref 6.0–8.5)
eGFR: 56 mL/min/{1.73_m2} — ABNORMAL LOW (ref >59)

## 2022-11-01 LAB — MAGNESIUM: Magnesium: 2.1 mg/dL (ref 1.6–2.3)

## 2022-11-02 NOTE — Progress Notes (Unsigned)
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 following Advanced Practice Providers on your designated Care Team:   Murray Hodgkins, NP Christell Faith, PA-C Cadence Kathlen Mody, PA-C Gerrie Nordmann, NP  Other Instructions  Your physician recommends that you weigh yourself everyday at the same time, on the same scale and with the same amount of clothing. Please keep a record of these weights and bring to your next appointment.   Please check your blood pressure and heart rate daily  Keep a journal of these daily blood pressure and heart rate readings and call our office or send a message through [place] with the results. Thank you!  It is best to check your BP 1-2 hours after taking your medications to see the medications effectiveness on your BP.    Here are some tips that our clinical pharmacists share for home BP monitoring:          Rest 10 minutes before taking  your blood pressure.          Don't smoke or drink caffeinated beverages for at least 30 minutes before.          Take your blood pressure before (not after) you eat.          Sit comfortably with your back supported and both feet on the floor (don't cross your legs).          Elevate your arm to heart level on a table or a desk.          Use the proper sized cuff. It should fit smoothly and snugly around your bare upper arm. There should be enough room to slip a fingertip under the cuff. The bottom edge of the cuff should be 1 inch above the crease of the elbow. Mediterranean Diet A Mediterranean diet refers to food and lifestyle choices that are based on the traditions of countries located on the [employer]. It focuses on eating more fruits, vegetables, whole grains, beans, nuts, seeds, and heart-healthy fats, and eating less dairy, meat, eggs, and processed foods with added sugar, salt, and fat. This way of eating has been shown to help prevent certain conditions and improve outcomes for people who have chronic diseases, like kidney disease and heart disease. What are tips for following this plan? Reading food labels Check the serving size of packaged foods. For foods such as rice and pasta, the serving size refers to the amount of cooked product, not dry. Check the total fat in packaged foods. Avoid foods that have saturated fat or trans fats. Check the ingredient list for added sugars, such as corn syrup. Shopping  Buy a variety of foods that offer a balanced diet, including: Fresh fruits and vegetables (produce). Grains, beans, nuts, and seeds. Some of these may be available in unpackaged forms or large amounts (in bulk). Fresh seafood. Poultry and eggs. Low-fat dairy products. Buy whole ingredients instead of prepackaged foods. Buy fresh fruits and vegetables in-season from local farmers  markets. Buy plain frozen fruits and vegetables. If you do not have access to quality  fresh seafood, buy precooked frozen shrimp or canned fish, such as tuna, salmon, or sardines. Stock your pantry so you always have certain foods on hand, such as olive oil, canned tuna, canned tomatoes, rice, pasta, and beans. Cooking Cook foods with extra-virgin olive oil instead of using butter or other vegetable oils. Have meat as a side dish, and have vegetables or grains as your main dish. This means having meat in small portions or adding small amounts of meat to foods like pasta or stew. Use beans or vegetables instead of meat in common dishes like chili or lasagna. Experiment with different cooking methods. Try roasting, broiling, steaming, and sauting vegetables. Add frozen vegetables to soups, stews, pasta, or rice. Add nuts or seeds for added healthy fats and plant protein at each meal. You can add these to yogurt, salads, or vegetable dishes. Marinate fish or vegetables using olive oil, lemon juice, garlic, and fresh herbs. Meal planning Plan to eat one vegetarian meal one day each week. Try to work up to two vegetarian meals, if possible. Eat seafood two or more times a week. Have healthy snacks readily available, such as: Vegetable sticks with hummus. Greek yogurt. Fruit and nut trail mix. Eat balanced meals throughout the week. This includes: Fruit: 2-3 servings a day. Vegetables: 4-5 servings a day. Low-fat dairy: 2 servings a day. Fish, poultry, or lean meat: 1 serving a day. Beans and legumes: 2 or more servings a week. Nuts and seeds: 1-2 servings a day. Whole grains: 6-8 servings a day. Extra-virgin olive oil: 3-4 servings a day. Limit red meat and sweets to only a few servings a month. Lifestyle  Cook and eat meals together with your family, when possible. Drink enough fluid to keep your urine pale yellow. Be physically active every day. This includes: Aerobic exercise like running or swimming. Leisure activities like gardening, walking, or housework. Get 7-8  hours of sleep each night. If recommended by your health care provider, drink red wine in moderation. This means 1 glass a day for nonpregnant women and 2 glasses a day for men. A glass of wine equals 5 oz (150 mL). What foods should I eat? Fruits Apples. Apricots. Avocado. Berries. Bananas. Cherries. Dates. Figs. Grapes. Lemons. Melon. Oranges. Peaches. Plums. Pomegranate. Vegetables Artichokes. Beets. Broccoli. Cabbage. Carrots. Eggplant. Green beans. Chard. Kale. Spinach. Onions. Leeks. Peas. Squash. Tomatoes. Peppers. Radishes. Grains Whole-grain pasta. Brown rice. Bulgur wheat. Polenta. Couscous. Whole-wheat bread. [place]. Meats and other proteins Beans. Almonds. Sunflower seeds. Pine nuts. Peanuts. [place]. Salmon. Scallops. Shrimp. [place]. Tilapia. Clams. Oysters. Eggs. Poultry without skin. Dairy Low-fat milk. Cheese. Greek yogurt. Fats and oils Extra-virgin olive oil. Avocado oil. Grapeseed oil. Beverages Water. Red wine. Herbal tea. Sweets and desserts Greek yogurt with honey. Baked apples. Poached pears. Trail mix. Seasonings and condiments Basil. Cilantro. Coriander. Cumin. Mint. Parsley. Sage. Rosemary. Tarragon. Garlic. Oregano. Thyme. Pepper. Balsamic vinegar. Tahini. Hummus. Tomato sauce. Olives. Mushrooms. The items listed above may not be a complete list of foods and beverages you can eat. Contact a dietitian for more information. What foods should I limit? This is a list of foods that should be eaten rarely or only on special occasions. Fruits Fruit canned in syrup. Vegetables Deep-fried potatoes (french fries). Grains Prepackaged pasta or rice dishes. Prepackaged cereal with added sugar. Prepackaged snacks with added sugar. Meats and other proteins Beef. Pork. Lamb. Poultry with skin. Hot dogs.  [place] Salines. Dairy Ice cream. Sour cream. Whole milk. Fats and oils Butter. Canola oil. Vegetable oil. Beef fat (tallow). Lard. Beverages Juice. Sugar-sweetened soft  drinks. Beer. Liquor and spirits. Sweets and desserts Cookies. Cakes. Pies. Candy. Seasonings and condiments Mayonnaise. Pre-made sauces and marinades. The items listed above may not be a complete list of foods and beverages you should limit. Contact a dietitian for more information. Summary The Mediterranean diet includes both food and lifestyle choices. Eat a variety of fresh fruits and vegetables, beans, nuts, seeds, and whole grains. Limit the amount of red meat and sweets that you eat. If recommended by your health care provider, drink red wine in moderation. This means 1 glass a day for nonpregnant women and 2 glasses a day for men. A glass of wine equals 5 oz (150 mL). This information is not intended to replace advice given to you by your health care provider. Make sure you discuss any questions you have with your health care provider. Document Revised: 01/13/2020 Document Reviewed: 11/10/2019 Elsevier Patient Education  [place], Finis Bud, NP  11/03/2022 1:23 PM    [place]

## 2022-11-03 ENCOUNTER — Encounter: Payer: Self-pay | Admitting: Physician Assistant

## 2022-11-03 ENCOUNTER — Ambulatory Visit: Payer: Medicare Other | Attending: Physician Assistant | Admitting: Nurse Practitioner

## 2022-11-03 VITALS — BP 112/62 | HR 103 | Ht 64.0 in | Wt 257.1 lb

## 2022-11-03 DIAGNOSIS — E785 Hyperlipidemia, unspecified: Secondary | ICD-10-CM | POA: Diagnosis not present

## 2022-11-03 DIAGNOSIS — I4811 Longstanding persistent atrial fibrillation: Secondary | ICD-10-CM | POA: Diagnosis not present

## 2022-11-03 DIAGNOSIS — E860 Dehydration: Secondary | ICD-10-CM | POA: Insufficient documentation

## 2022-11-03 DIAGNOSIS — R42 Dizziness and giddiness: Secondary | ICD-10-CM | POA: Insufficient documentation

## 2022-11-03 DIAGNOSIS — I6523 Occlusion and stenosis of bilateral carotid arteries: Secondary | ICD-10-CM

## 2022-11-03 DIAGNOSIS — I2510 Atherosclerotic heart disease of native coronary artery without angina pectoris: Secondary | ICD-10-CM | POA: Diagnosis not present

## 2022-11-03 DIAGNOSIS — Z8673 Personal history of transient ischemic attack (TIA), and cerebral infarction without residual deficits: Secondary | ICD-10-CM | POA: Insufficient documentation

## 2022-11-03 DIAGNOSIS — Z6841 Body Mass Index (BMI) 40.0 and over, adult: Secondary | ICD-10-CM | POA: Insufficient documentation

## 2022-11-03 DIAGNOSIS — I5020 Unspecified systolic (congestive) heart failure: Secondary | ICD-10-CM | POA: Insufficient documentation

## 2022-11-03 MED ORDER — DILTIAZEM HCL ER COATED BEADS 300 MG PO CP24: 300.0000 mg | ORAL_CAPSULE | Freq: Every day | ORAL | 2 refills | Status: DC

## 2022-11-03 MED ORDER — SPIRONOLACTONE 25 MG PO TABS: 12.5000 mg | ORAL_TABLET | Freq: Every day | ORAL | 1 refills | Status: DC

## 2022-11-03 MED ORDER — DILTIAZEM HCL ER COATED BEADS 360 MG PO CP24: 360.0000 mg | ORAL_CAPSULE | Freq: Every day | ORAL | 1 refills | Status: DC

## 2022-11-03 NOTE — Patient Instructions (Signed)
Medication Instructions:  DECREASE the Spironolactone to 12.5 mg once daily (half a tablet)  INCREASE the Diltiazem to 360 mg once daily  *If you need a refill on your cardiac medications before your next appointment, please call your pharmacy*   Lab Work: None ordered If you have labs (blood work) drawn today and your tests are completely normal, you will receive your results only by: [place] (if you have MyChart) OR A paper copy in the mail If you have any lab test that is abnormal or we need to change your treatment, we will call you to review the results.   Testing/Procedures: None ordered   Follow-Up: At [facility], you and your health needs are our priority.  As part of our continuing mission to provide you with exceptional heart care, we have created designated Provider Care Teams.  These Care Teams include your primary Cardiologist (physician) and Advanced Practice Providers (APPs -  Physician Assistants and Nurse Practitioners) who all work together to provide you with the care you need, when you need it.  We recommend signing up for the patient portal called "MyChart".  Sign up information is provided on this After Visit Summary.  MyChart is used to connect with patients for Virtual Visits (Telemedicine).  Patients are able to view lab/test results, encounter notes, upcoming appointments, etc.  Non-urgent messages can be sent to your provider as well.   To learn more about what you can do with MyChart, go to [email or web address].    Your next appointment:   1 month(s)  The format for your next appointment:   In Person  Provider:   You will see one of the following Advanced Practice Providers on your designated Care Team:   Murray Hodgkins, NP Christell Faith, PA-C Cadence Kathlen Mody, PA-C Gerrie Nordmann, NP  Other Instructions  Your physician recommends that you weigh yourself everyday at the same time, on the same scale and with the same amount of  clothing. Please keep a record of these weights and bring to your next appointment.   Please check your blood pressure and heart rate daily  Keep a journal of these daily blood pressure and heart rate readings and call our office or send a message through [place] with the results. Thank you!  It is best to check your BP 1-2 hours after taking your medications to see the medications effectiveness on your BP.    Here are some tips that our clinical pharmacists share for home BP monitoring:          Rest 10 minutes before taking your blood pressure.          Don't smoke or drink caffeinated beverages for at least 30 minutes before.          Take your blood pressure before (not after) you eat.          Sit comfortably with your back supported and both feet on the floor (don't cross your legs).          Elevate your arm to heart level on a table or a desk.          Use the proper sized cuff. It should fit smoothly and snugly around your bare upper arm. There should be enough room to slip a fingertip under the cuff. The bottom edge of the cuff should be 1 inch above the crease of the elbow. Mediterranean Diet A Mediterranean diet refers to food and lifestyle choices that are based on the traditions  of countries located on the [employer]. It focuses on eating more fruits, vegetables, whole grains, beans, nuts, seeds, and heart-healthy fats, and eating less dairy, meat, eggs, and processed foods with added sugar, salt, and fat. This way of eating has been shown to help prevent certain conditions and improve outcomes for people who have chronic diseases, like kidney disease and heart disease. What are tips for following this plan? Reading food labels Check the serving size of packaged foods. For foods such as rice and pasta, the serving size refers to the amount of cooked product, not dry. Check the total fat in packaged foods. Avoid foods that have saturated fat or trans fats. Check the  ingredient list for added sugars, such as corn syrup. Shopping  Buy a variety of foods that offer a balanced diet, including: Fresh fruits and vegetables (produce). Grains, beans, nuts, and seeds. Some of these may be available in unpackaged forms or large amounts (in bulk). Fresh seafood. Poultry and eggs. Low-fat dairy products. Buy whole ingredients instead of prepackaged foods. Buy fresh fruits and vegetables in-season from local farmers markets. Buy plain frozen fruits and vegetables. If you do not have access to quality fresh seafood, buy precooked frozen shrimp or canned fish, such as tuna, salmon, or sardines. Stock your pantry so you always have certain foods on hand, such as olive oil, canned tuna, canned tomatoes, rice, pasta, and beans. Cooking Cook foods with extra-virgin olive oil instead of using butter or other vegetable oils. Have meat as a side dish, and have vegetables or grains as your main dish. This means having meat in small portions or adding small amounts of meat to foods like pasta or stew. Use beans or vegetables instead of meat in common dishes like chili or lasagna. Experiment with different cooking methods. Try roasting, broiling, steaming, and sauting vegetables. Add frozen vegetables to soups, stews, pasta, or rice. Add nuts or seeds for added healthy fats and plant protein at each meal. You can add these to yogurt, salads, or vegetable dishes. Marinate fish or vegetables using olive oil, lemon juice, garlic, and fresh herbs. Meal planning Plan to eat one vegetarian meal one day each week. Try to work up to two vegetarian meals, if possible. Eat seafood two or more times a week. Have healthy snacks readily available, such as: Vegetable sticks with hummus. Greek yogurt. Fruit and nut trail mix. Eat balanced meals throughout the week. This includes: Fruit: 2-3 servings a day. Vegetables: 4-5 servings a day. Low-fat dairy: 2 servings a day. Fish,  poultry, or lean meat: 1 serving a day. Beans and legumes: 2 or more servings a week. Nuts and seeds: 1-2 servings a day. Whole grains: 6-8 servings a day. Extra-virgin olive oil: 3-4 servings a day. Limit red meat and sweets to only a few servings a month. Lifestyle  Cook and eat meals together with your family, when possible. Drink enough fluid to keep your urine pale yellow. Be physically active every day. This includes: Aerobic exercise like running or swimming. Leisure activities like gardening, walking, or housework. Get 7-8 hours of sleep each night. If recommended by your health care provider, drink red wine in moderation. This means 1 glass a day for nonpregnant women and 2 glasses a day for men. A glass of wine equals 5 oz (150 mL). What foods should I eat? Fruits Apples. Apricots. Avocado. Berries. Bananas. Cherries. Dates. Figs. Grapes. Lemons. Melon. Oranges. Peaches. Plums. Pomegranate. Vegetables Artichokes. Beets. Broccoli. Cabbage. Carrots. Eggplant. [place]  beans. Chard. Kale. Spinach. Onions. Leeks. Peas. Squash. Tomatoes. Peppers. Radishes. Grains Whole-grain pasta. Brown rice. Bulgur wheat. Polenta. Couscous. Whole-wheat bread. [place]. Meats and other proteins Beans. Almonds. Sunflower seeds. Pine nuts. Peanuts. [place]. Salmon. Scallops. Shrimp. [place]. Tilapia. Clams. Oysters. Eggs. Poultry without skin. Dairy Low-fat milk. Cheese. Greek yogurt. Fats and oils Extra-virgin olive oil. Avocado oil. Grapeseed oil. Beverages Water. Red wine. Herbal tea. Sweets and desserts Greek yogurt with honey. Baked apples. Poached pears. Trail mix. Seasonings and condiments Basil. Cilantro. Coriander. Cumin. Mint. Parsley. Sage. Rosemary. Tarragon. Garlic. Oregano. Thyme. Pepper. Balsamic vinegar. Tahini. Hummus. Tomato sauce. Olives. Mushrooms. The items listed above may not be a complete list of foods and beverages you can eat. Contact a dietitian for more  information. What foods should I limit? This is a list of foods that should be eaten rarely or only on special occasions. Fruits Fruit canned in syrup. Vegetables Deep-fried potatoes (french fries). Grains Prepackaged pasta or rice dishes. Prepackaged cereal with added sugar. Prepackaged snacks with added sugar. Meats and other proteins Beef. Pork. Lamb. Poultry with skin. Hot dogs. [place] Salines. Dairy Ice cream. Sour cream. Whole milk. Fats and oils Butter. Canola oil. Vegetable oil. Beef fat (tallow). Lard. Beverages Juice. Sugar-sweetened soft drinks. Beer. Liquor and spirits. Sweets and desserts Cookies. Cakes. Pies. Candy. Seasonings and condiments Mayonnaise. Pre-made sauces and marinades. The items listed above may not be a complete list of foods and beverages you should limit. Contact a dietitian for more information. Summary The Mediterranean diet includes both food and lifestyle choices. Eat a variety of fresh fruits and vegetables, beans, nuts, seeds, and whole grains. Limit the amount of red meat and sweets that you eat. If recommended by your health care provider, drink red wine in moderation. This means 1 glass a day for nonpregnant women and 2 glasses a day for men. A glass of wine equals 5 oz (150 mL). This information is not intended to replace advice given to you by your health care provider. Make sure you discuss any questions you have with your health care provider. Document Revised: 01/13/2020 Document Reviewed: 11/10/2019 Elsevier Patient Education  [place].

## 2022-11-15 ENCOUNTER — Other Ambulatory Visit: Payer: Self-pay | Admitting: Family

## 2022-11-15 DIAGNOSIS — Z8673 Personal history of transient ischemic attack (TIA), and cerebral infarction without residual deficits: Secondary | ICD-10-CM

## 2022-11-15 DIAGNOSIS — E785 Hyperlipidemia, unspecified: Secondary | ICD-10-CM

## 2022-11-15 DIAGNOSIS — I25118 Atherosclerotic heart disease of native coronary artery with other forms of angina pectoris: Secondary | ICD-10-CM

## 2022-11-19 ENCOUNTER — Other Ambulatory Visit: Payer: Self-pay | Admitting: Internal Medicine

## 2022-11-19 DIAGNOSIS — J9611 Chronic respiratory failure with hypoxia: Secondary | ICD-10-CM

## 2022-11-28 NOTE — Telephone Encounter (Signed)
Left voicemail for patient to give the office a call.

## 2022-11-28 NOTE — Progress Notes (Signed)
Call for lab result --Sodium level is slightly elevated 145, please drink more water --magnesium level is normal --slight improvement in kidney function  --glucose level improved --liver function normal

## 2022-11-30 ENCOUNTER — Other Ambulatory Visit: Payer: Self-pay | Admitting: Cardiovascular Disease

## 2022-11-30 DIAGNOSIS — R600 Localized edema: Secondary | ICD-10-CM

## 2022-11-30 DIAGNOSIS — I5022 Chronic systolic (congestive) heart failure: Secondary | ICD-10-CM

## 2022-12-01 ENCOUNTER — Telehealth: Payer: Self-pay

## 2022-12-01 NOTE — Telephone Encounter (Signed)
Left voicemail for patient to give office a call.
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
## 2022-12-01 NOTE — Telephone Encounter (Signed)
-----   Message from Jonetta Osgood, NP sent at 11/28/2022  1:42 PM EST ----- Call for lab result --Sodium level is slightly elevated 145, please drink more water --magnesium level is normal --slight improvement in kidney function  --glucose level improved --liver function normal

## 2022-12-01 NOTE — Telephone Encounter (Signed)
Pt notified for labs result

## 2022-12-03 ENCOUNTER — Ambulatory Visit: Payer: Medicare Other | Admitting: Physician Assistant

## 2022-12-08 ENCOUNTER — Telehealth: Payer: Self-pay

## 2022-12-08 DIAGNOSIS — I110 Hypertensive heart disease with heart failure: Secondary | ICD-10-CM | POA: Diagnosis not present

## 2022-12-08 DIAGNOSIS — Z888 Allergy status to other drugs, medicaments and biological substances status: Secondary | ICD-10-CM | POA: Diagnosis not present

## 2022-12-08 DIAGNOSIS — Z20822 Contact with and (suspected) exposure to covid-19: Secondary | ICD-10-CM | POA: Diagnosis not present

## 2022-12-08 DIAGNOSIS — J101 Influenza due to other identified influenza virus with other respiratory manifestations: Secondary | ICD-10-CM | POA: Diagnosis not present

## 2022-12-08 DIAGNOSIS — Z8673 Personal history of transient ischemic attack (TIA), and cerebral infarction without residual deficits: Secondary | ICD-10-CM | POA: Diagnosis not present

## 2022-12-08 DIAGNOSIS — I4891 Unspecified atrial fibrillation: Secondary | ICD-10-CM | POA: Diagnosis not present

## 2022-12-08 DIAGNOSIS — R110 Nausea: Secondary | ICD-10-CM | POA: Diagnosis not present

## 2022-12-08 DIAGNOSIS — I5020 Unspecified systolic (congestive) heart failure: Secondary | ICD-10-CM | POA: Diagnosis not present

## 2022-12-08 DIAGNOSIS — R531 Weakness: Secondary | ICD-10-CM | POA: Diagnosis not present

## 2022-12-08 DIAGNOSIS — K219 Gastro-esophageal reflux disease without esophagitis: Secondary | ICD-10-CM | POA: Diagnosis not present

## 2022-12-08 DIAGNOSIS — R509 Fever, unspecified: Secondary | ICD-10-CM | POA: Diagnosis not present

## 2022-12-08 DIAGNOSIS — R0600 Dyspnea, unspecified: Secondary | ICD-10-CM | POA: Diagnosis not present

## 2022-12-08 DIAGNOSIS — R0602 Shortness of breath: Secondary | ICD-10-CM | POA: Diagnosis not present

## 2022-12-08 DIAGNOSIS — Z7901 Long term (current) use of anticoagulants: Secondary | ICD-10-CM | POA: Diagnosis not present

## 2022-12-08 MED ORDER — OSELTAMIVIR PHOSPHATE 75 MG PO CAPS: 75.0000 mg | ORAL_CAPSULE | Freq: Two times a day (BID) | ORAL | 0 refills | Status: AC

## 2022-12-08 MED ORDER — PREDNISONE 10 MG PO TABS: ORAL_TABLET | ORAL | 0 refills | Status: DC

## 2022-12-08 MED ORDER — LEVOFLOXACIN 500 MG PO TABS: 500.0000 mg | ORAL_TABLET | Freq: Every day | ORAL | 0 refills | Status: AC

## 2022-12-08 MED ORDER — HYDROCOD POLI-CHLORPHE POLI ER 10-8 MG/5ML PO SUER: 5.0000 mL | Freq: Two times a day (BID) | ORAL | 0 refills | Status: DC | PRN

## 2022-12-08 NOTE — Telephone Encounter (Signed)
Pt advised that we send antibiotic and prednisone and cough med advised symptoms getting worse need to go ED

## 2022-12-09 DIAGNOSIS — J101 Influenza due to other identified influenza virus with other respiratory manifestations: Secondary | ICD-10-CM | POA: Diagnosis not present

## 2022-12-12 ENCOUNTER — Telehealth: Payer: Self-pay | Admitting: Cardiovascular Disease

## 2022-12-12 NOTE — Telephone Encounter (Signed)
Pt c/o BP issue: STAT if pt c/o blurred vision, one-sided weakness or slurred speech  1. What are your last 5 BP readings?   Range way below 100 on the bottom and about 48-57 on the top  2. Are you having any other symptoms (ex. Dizziness, headache, blurred vision, passed out)?   Nausea, chest congestion  3. What is your BP issue?   Patient stated she was in the hospital on Monday with the flu. Patient stated she was advised to drink fluids but is concerned regarding taking her fluid pill.

## 2022-12-12 NOTE — Telephone Encounter (Signed)
Patient made aware.

## 2022-12-12 NOTE — Telephone Encounter (Signed)
The patient recently went to the ED at [facility] with dehydration and flu. Her blood pressures have been staying in the 16'X systolic. She is currently holding her furosemide due to the dehydration and low blood pressures. She has been advised to stay hydrated and rest. She will monitor her swelling, weight and blood pressures. She has a follow up with PCP on 1/3 and Dr. Fletcher Anon on 1/9.

## 2022-12-12 NOTE — Telephone Encounter (Signed)
Continue to hold furosemide for now.

## 2022-12-15 ENCOUNTER — Encounter: Payer: Self-pay | Admitting: Nurse Practitioner

## 2022-12-15 MED ORDER — FUROSEMIDE 20 MG PO TABS: 20.0000 mg | ORAL_TABLET | Freq: Every day | ORAL | Status: DC

## 2022-12-24 ENCOUNTER — Ambulatory Visit (INDEPENDENT_AMBULATORY_CARE_PROVIDER_SITE_OTHER): Payer: Medicare Other | Admitting: Nurse Practitioner

## 2022-12-24 ENCOUNTER — Encounter: Payer: Self-pay | Admitting: Nurse Practitioner

## 2022-12-24 ENCOUNTER — Telehealth: Payer: Self-pay | Admitting: Nurse Practitioner

## 2022-12-24 ENCOUNTER — Telehealth: Payer: Self-pay

## 2022-12-24 VITALS — BP 120/63 | HR 95 | Temp 97.6°F | Resp 16 | Ht 64.0 in | Wt 264.4 lb

## 2022-12-24 DIAGNOSIS — J101 Influenza due to other identified influenza virus with other respiratory manifestations: Secondary | ICD-10-CM

## 2022-12-24 DIAGNOSIS — L601 Onycholysis: Secondary | ICD-10-CM | POA: Diagnosis not present

## 2022-12-24 DIAGNOSIS — R600 Localized edema: Secondary | ICD-10-CM | POA: Diagnosis not present

## 2022-12-24 DIAGNOSIS — R7303 Prediabetes: Secondary | ICD-10-CM | POA: Diagnosis not present

## 2022-12-24 DIAGNOSIS — Z760 Encounter for issue of repeat prescription: Secondary | ICD-10-CM | POA: Diagnosis not present

## 2022-12-24 DIAGNOSIS — E119 Type 2 diabetes mellitus without complications: Secondary | ICD-10-CM

## 2022-12-24 LAB — POCT GLYCOSYLATED HEMOGLOBIN (HGB A1C): Hemoglobin A1C: 7 % — AB (ref 4.0–5.6)

## 2022-12-24 MED ORDER — ALPRAZOLAM 0.25 MG PO TABS: 0.2500 mg | ORAL_TABLET | Freq: Two times a day (BID) | ORAL | 2 refills | Status: DC | PRN

## 2022-12-24 NOTE — Telephone Encounter (Signed)
Dermatology referral sent via [facility] to Dr. Kellie Moor with [facility]

## 2022-12-24 NOTE — Patient Outreach (Signed)
  Care Coordination   Initial Visit Note   12/24/2022 Name: Laura [name] MRN: [id number] DOB: 1951-12-01  Laura [name] is a 72 y.o. year old female who sees Jonetta Osgood, NP for primary care. I spoke with  Beverly Sessions by phone today.  What matters to the patients health and wellness today?  Patient states she is concerned about fluid retentions.  Reports having ED visit approximately 1 week ago due to fluid retention.    Goals Addressed             This Visit's Progress    Management/ education  of health conditions       Care Coordination Interventions: Evaluation of current treatment plan related to heart failure/ atrial fibrillation and patient's adherence to plan as established by provider: Patient states she has strong family support who assist with her care.  She states she has all needed medical equipment, family provides transportation and she can afford her medications currently.  Patient reports having recent ED visit due to increase in fluid retention. She states she is concerned about this.  She states she weighs herself daily. Checks blood pressure, pulse oxygen and uses oxygen and CPAP.  Reviewed medications with patient and discussed importance of compliance Reviewed scheduled/upcoming provider appointments:  Per chart review pt has follow up with cardiovascular provider on 12/30/22 Discussed plans with patient for ongoing care management follow up and provided patient with direct contact information for care management team;  Patient verbally agreed to follow up appointment with [facility] on 01/28/23. Discussed signs/ symptoms of heart failure and atrial fibrillation           SDOH assessments and interventions completed:  Yes  SDOH Interventions Today    Flowsheet Row Most Recent Value  SDOH Interventions   Food Insecurity Interventions Intervention Not Indicated  Housing Interventions Intervention Not Indicated  Transportation Interventions Intervention  Not Indicated        Care Coordination Interventions:  Yes, provided   Follow up plan: Follow up call scheduled for 01/28/23    Encounter Outcome:  Pt. Visit Completed   Quinn Plowman RN,BSN,CCM [place] [phone] direct line

## 2022-12-24 NOTE — Patient Instructions (Signed)
Visit Information  Thank you for taking time to visit with me today. Please don't hesitate to contact me if I can be of assistance to you.   Following are the goals we discussed today:   Goals Addressed             This Visit's Progress    Management/ education  of health conditions       Care Coordination Interventions: Evaluation of current treatment plan related to heart failure/ atrial fibrillation and patient's adherence to plan as established by provider: Patient states she has strong family support who assist with her care.  She states she has all needed medical equipment, family provides transportation and she can afford her medications currently.  Patient reports having recent ED visit due to increase in fluid retention. She states she is concerned about this.  She states she weighs herself daily. Checks blood pressure, pulse oxygen and uses oxygen and CPAP.  Reviewed medications with patient and discussed importance of compliance Reviewed scheduled/upcoming provider appointments:  Per chart review pt has follow up with cardiovascular provider on 12/30/22 Discussed plans with patient for ongoing care management follow up and provided patient with direct contact information for care management team;  Patient verbally agreed to follow up appointment with [facility] on 01/28/23. Discussed signs/ symptoms of heart failure and atrial fibrillation           Our next appointment is by telephone on 01/28/23 at 2 pm  Please call the care guide team at [phone] if you need to cancel or reschedule your appointment.   If you are experiencing a Mental Health or [place] or need someone to talk to, please call the Suicide and Crisis Lifeline: 988 call 1-800-273-TALK (toll free, 24 hour hotline)  Patient verbalizes understanding of instructions and care plan provided today and agrees to view in [place]. Active MyChart status and patient understanding of how to access instructions  and care plan via MyChart confirmed with patient.     Quinn Plowman RN,BSN,CCM [place] Coordination [phone] direct line
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
## 2022-12-30 ENCOUNTER — Ambulatory Visit: Payer: Medicare Other | Admitting: Cardiovascular Disease

## 2022-12-31 ENCOUNTER — Ambulatory Visit: Payer: Medicare Other | Admitting: Cardiovascular Disease

## 2023-01-03 NOTE — Progress Notes (Deleted)
MRN : [id number]  Laura [name] is a 72 y.o. (September 07, 1951) female who presents with chief complaint of check carotid arteries.  History of Present Illness:   The patient is seen for follow up evaluation of carotid stenosis. The carotid stenosis followed by ultrasound.    The patient denies amaurosis fugax. There is no recent history of TIA symptoms or focal motor deficits. There is no prior documented CVA.   The patient is taking enteric-coated aspirin 81 mg daily.   There is no history of migraine headaches. There is no history of seizures.   The patient is also here for followup evaluation regarding leg swelling.  The swelling has persisted and the pain associated with swelling continues. There have not been any interval development of a ulcerations or wounds.   Since the previous visit the patient has been wearing graduated compression stockings and has noted little if any improvement in the lymphedema. The patient has been using compression routinely morning until night.   The patient also states elevation during the day and exercise is being done too.    She also has PAD.  No recent shortening of the patient's walking distance or new symptoms consistent with claudication.  No history of rest pain symptoms. No new ulcers or wounds of the lower extremities have occurred.   No recent episodes of angina or shortness of breath documented.    Carotid Duplex done today shows 1-39% bilateral ICA stenosis.  No change compared to last study  No outpatient medications have been marked as taking for the 01/05/23 encounter (Appointment) with Delana Meyer, Dolores Lory, MD.    Past Medical History:  Diagnosis Date   Asthma    Chronic atrial fibrillation ([place])    a. diagnosed in 09/2016; b. failed flecainide and propafenone due to LE swelling and SOB, could not afford Multaq; c. CHADS2VASc => 5 (CHF, HTN, age x 1, nonobs CAD, female)--> Eliquis   COPD (chronic obstructive pulmonary disease)  (HCC)    GERD (gastroesophageal reflux disease)    HFmrEF (heart failure with mid-range ejection fraction) ([place])    a. 12/2019 Echo: EF 40-45%. b. 01/2022 Echo: EF 40-45%.   Hyperlipidemia    Hypertension    NICM (nonischemic cardiomyopathy) ([place])    a. 12/2019 Echo: EF 40-45%, glob HK, mildly reduced RV fxn, sev dil LA. *HR 130 (afib) during study.   Nonobstructive CAD (coronary artery disease)    a. Lexiscan Myoview 10/2016: no evidence of ischemia, EF 53%; b. 02/2020 Cath: LM nl, LAD 20p, 37m LCX 20ost, OM1/2/3 nl, LPDA nl, LPL1/2 nl, LPAV nl, RCA small, nl.   Obesity    Obstructive sleep apnea    Pulmonary hypertension ([place]    Sleep apnea    Stroke ([place]    Systolic dysfunction    a. TTE 10/2016: EF 50%, mild LVH, moderately dilated LA, moderate MR/TR, mild pulmonary hypertension    Past Surgical History:  Procedure Laterality Date   BOWEL RESECTION  09/11/2019   Procedure: SMALL BOWEL RESECTION;  Surgeon: CHerbert Pun MD;  Location: [facility] ORS;  Service: General;;   CARDIAC CATHETERIZATION     cataract surgery     COLONOSCOPY WITH PROPOFOL N/A 03/19/2020   Procedure: COLONOSCOPY WITH PROPOFOL;  Surgeon: AJonathon Bellows MD;  [facility];  Service: Gastroenterology;  Laterality: N/A;   CORONARY ANGIOPLASTY     INCISION AND DRAINAGE ABSCESS Right 06/29/2016   Procedure: INCISION AND DRAINAGE ABSCESS;  Surgeon: RFlorene Glen MD;  Location: [facility];  Service: General;  Laterality: Right;   INCISION AND DRAINAGE OF WOUND Left 06/29/2016   Procedure: IRRIGATION AND DEBRIDEMENT WOUND;  Surgeon: Florene Glen, MD;  Location: [facility];  Service: General;  Laterality: Left;   IR ANGIO VERTEBRAL SEL SUBCLAVIAN INNOMINATE UNI R MOD SED  11/20/2020   IR CT HEAD LTD  11/20/2020   IR PERCUTANEOUS ART THROMBECTOMY/INFUSION INTRACRANIAL INC DIAG ANGIO  11/20/2020   LAPAROSCOPIC RIGHT COLECTOMY  09/11/2019   Procedure: RIGHT COLECTOMY;  Surgeon: Herbert Pun, MD;   Location: [facility];  Service: General;;   LAPAROSCOPY N/A 09/11/2019   Procedure: LAPAROSCOPY DIAGNOSTIC;  Surgeon: Herbert Pun, MD;  Location: [facility];  Service: General;  Laterality: N/A;   LAPAROTOMY N/A 09/13/2019   Procedure: REOPENING OF RECENT LAPAROTOMYANASTOMOSIS OF BOWEL;  Surgeon: Herbert Pun, MD;  Location: [facility];  Service: General;  Laterality: N/A;   RADIOLOGY WITH ANESTHESIA N/A 11/20/2020   Procedure: IR WITH ANESTHESIA - CODE STROKE;  Surgeon: Radiologist, Medication, MD;  Location: [place];  Service: Radiology;  Laterality: N/A;   RIGHT/LEFT HEART CATH AND CORONARY ANGIOGRAPHY Bilateral 02/27/2020   Procedure: RIGHT/LEFT HEART CATH AND CORONARY ANGIOGRAPHY;  Surgeon: Wellington Hampshire, MD;  Location: [place] CV LAB;  Service: Cardiovascular;  Laterality: Bilateral;   VISCERAL ANGIOGRAPHY N/A 09/12/2019   Procedure: VISCERAL ANGIOGRAPHY;  Surgeon: Algernon Huxley, MD;  Location: [place] [facility];  Service: Cardiovascular;  Laterality: N/A;    Social History Social History   Tobacco Use   Smoking status: Never   Smokeless tobacco: Never  Vaping Use   Vaping Use: Never used  Substance Use Topics   Alcohol use: No   Drug use: No    Family History Family History  Problem Relation Age of Onset   Dementia Mother    Osteoporosis Mother    Vascular Disease Mother    COPD Father    Heart disease Brother    Cancer Daughter     Allergies  Allergen Reactions   Flecainide Shortness Of Breath   Metoprolol Shortness Of Breath, Swelling and Other (See Comments)    "My limbs swell" also   Propafenone Shortness Of Breath, Swelling and Other (See Comments)    "My limbs swell" also   Rivaroxaban Swelling and Other (See Comments)    Xarelto- "My limbs swell" Other reaction(s): Muscle Pain, Other (See Comments) Other reaction(s): Other (See Comments) Xarelto- "My limbs swell"     REVIEW OF SYSTEMS (Negative unless checked)  Constitutional:  []$ Weight loss  []$ Fever  []$ Chills Cardiac: []$ Chest pain   []$ Chest pressure   []$ Palpitations   []$ Shortness of breath when laying flat   []$ Shortness of breath with exertion. Vascular:  [x]$ Pain in legs with walking   []$ Pain in legs at rest  []$ History of DVT   []$ Phlebitis   []$ Swelling in legs   []$ Varicose veins   []$ Non-healing ulcers Pulmonary:   []$ Uses home oxygen   []$ Productive cough   []$ Hemoptysis   []$ Wheeze  []$ COPD   []$ Asthma Neurologic:  []$ Dizziness   []$ Seizures   []$ History of stroke   []$ History of TIA  []$ Aphasia   []$ Vissual changes   []$ Weakness or numbness in arm   []$ Weakness or numbness in leg Musculoskeletal:   []$ Joint swelling   []$ Joint pain   []$ Low back pain Hematologic:  []$ Easy bruising  []$ Easy bleeding   []$ Hypercoagulable state   []$ Anemic Gastrointestinal:  []$ Diarrhea   []$ Vomiting  []$ Gastroesophageal reflux/heartburn   []$ Difficulty swallowing. Genitourinary:  []$ Chronic kidney  disease   []$ Difficult urination  []$ Frequent urination   []$ Blood in urine Skin:  []$ Rashes   []$ Ulcers  Psychological:  []$ History of anxiety   []$  History of major depression.  Physical Examination  There were no vitals filed for this visit. There is no height or weight on file to calculate BMI. Gen: WD/WN, NAD Head: [place]/AT, No temporalis wasting.  Ear/Nose/Throat: Hearing grossly intact, nares w/o erythema or drainage Eyes: PER, EOMI, sclera nonicteric.  Neck: Supple, no masses.  No bruit or JVD.  Pulmonary:  Good air movement, no audible wheezing, no use of accessory muscles.  Cardiac: RRR, normal S1, S2, no Murmurs. Vascular:  carotid bruit noted Vessel Right Left  Radial Palpable Palpable  Carotid  Palpable  Palpable  Subclav  Palpable Palpable  Gastrointestinal: soft, non-distended. No guarding/no peritoneal signs.  Musculoskeletal: M/S 5/5 throughout.  No visible deformity.  Neurologic: CN 2-12 intact. Pain and light touch intact in extremities.  Symmetrical.  Speech is fluent. Motor exam as listed  above. Psychiatric: Judgment intact, Mood & affect appropriate for pt's clinical situation. Dermatologic: No rashes or ulcers noted.  No changes consistent with cellulitis.   CBC Lab Results  Component Value Date   WBC 13.9 (H) 01/03/2022   HGB 15.1 (H) 01/03/2022   HCT 43.8 01/03/2022   MCV 94.2 01/03/2022   PLT 296 01/03/2022    BMET    Component Value Date/Time   NA 145 (H) 10/31/2022 1437   NA 140 11/28/2013 2312   K 4.3 10/31/2022 1437   K 4.1 11/28/2013 2312   CL 105 10/31/2022 1437   CL 108 (H) 11/28/2013 2312   CO2 20 10/31/2022 1437   CO2 27 11/28/2013 2312   GLUCOSE 126 (H) 10/31/2022 1437   GLUCOSE 186 (H) 07/11/2022 1528   GLUCOSE 130 (H) 11/28/2013 2312   BUN 16 10/31/2022 1437   BUN 29 (H) 11/28/2013 2312   CREATININE 1.06 (H) 10/31/2022 1437   CREATININE 0.79 11/28/2013 2312   CALCIUM 9.6 10/31/2022 1437   CALCIUM 8.7 11/28/2013 2312   GFRNONAA 55 (L) 07/11/2022 1528   GFRNONAA >60 11/28/2013 2312   GFRAA 80 12/04/2020 1517   GFRAA >60 11/28/2013 2312   CrCl cannot be calculated (Patient's most recent lab result is older than the maximum 21 days allowed.).  COAG Lab Results  Component Value Date   INR 1.2 11/20/2020   INR 3.6 (A) 06/13/2020   INR 3.9 (A) 06/11/2020    Radiology No results found.   Assessment/Plan There are no diagnoses linked to this encounter.   Hortencia Pilar, MD  01/03/2023 2:23 PM

## 2023-01-05 ENCOUNTER — Ambulatory Visit (INDEPENDENT_AMBULATORY_CARE_PROVIDER_SITE_OTHER): Payer: Medicare Other | Admitting: Vascular Surgery

## 2023-01-05 DIAGNOSIS — I739 Peripheral vascular disease, unspecified: Secondary | ICD-10-CM

## 2023-01-05 DIAGNOSIS — I4891 Unspecified atrial fibrillation: Secondary | ICD-10-CM

## 2023-01-05 DIAGNOSIS — I872 Venous insufficiency (chronic) (peripheral): Secondary | ICD-10-CM

## 2023-01-05 DIAGNOSIS — J449 Chronic obstructive pulmonary disease, unspecified: Secondary | ICD-10-CM

## 2023-01-05 DIAGNOSIS — I6523 Occlusion and stenosis of bilateral carotid arteries: Secondary | ICD-10-CM

## 2023-01-05 DIAGNOSIS — E785 Hyperlipidemia, unspecified: Secondary | ICD-10-CM

## 2023-01-05 DIAGNOSIS — I10 Essential (primary) hypertension: Secondary | ICD-10-CM

## 2023-01-07 ENCOUNTER — Encounter: Payer: Self-pay | Admitting: Cardiovascular Disease

## 2023-01-08 NOTE — Telephone Encounter (Signed)
Called the patient to discuss her symptoms. She stated that she had been having nausea, weakness and fatigue. She denied shortness of breath and edema.   She stated that she was feeling much better today. She declined an appointment for 1/19 stating she did not feel it was needed. Her already scheduled appointment has been moved up to Monday 1/22.  She stated that if she has worsening symptoms over the weekend then she will go to the ED.

## 2023-01-10 DIAGNOSIS — I110 Hypertensive heart disease with heart failure: Secondary | ICD-10-CM | POA: Diagnosis not present

## 2023-01-10 DIAGNOSIS — I5022 Chronic systolic (congestive) heart failure: Secondary | ICD-10-CM | POA: Diagnosis not present

## 2023-01-10 DIAGNOSIS — Z7901 Long term (current) use of anticoagulants: Secondary | ICD-10-CM | POA: Diagnosis not present

## 2023-01-10 DIAGNOSIS — Z888 Allergy status to other drugs, medicaments and biological substances status: Secondary | ICD-10-CM | POA: Diagnosis not present

## 2023-01-10 DIAGNOSIS — M79662 Pain in left lower leg: Secondary | ICD-10-CM | POA: Diagnosis not present

## 2023-01-10 DIAGNOSIS — N3000 Acute cystitis without hematuria: Secondary | ICD-10-CM | POA: Diagnosis not present

## 2023-01-10 DIAGNOSIS — L03116 Cellulitis of left lower limb: Secondary | ICD-10-CM | POA: Diagnosis not present

## 2023-01-10 DIAGNOSIS — R0602 Shortness of breath: Secondary | ICD-10-CM | POA: Diagnosis not present

## 2023-01-10 DIAGNOSIS — I2510 Atherosclerotic heart disease of native coronary artery without angina pectoris: Secondary | ICD-10-CM | POA: Diagnosis not present

## 2023-01-10 DIAGNOSIS — Z8673 Personal history of transient ischemic attack (TIA), and cerebral infarction without residual deficits: Secondary | ICD-10-CM | POA: Diagnosis not present

## 2023-01-10 DIAGNOSIS — R42 Dizziness and giddiness: Secondary | ICD-10-CM | POA: Diagnosis not present

## 2023-01-10 DIAGNOSIS — E860 Dehydration: Secondary | ICD-10-CM | POA: Diagnosis not present

## 2023-01-10 DIAGNOSIS — E119 Type 2 diabetes mellitus without complications: Secondary | ICD-10-CM | POA: Diagnosis not present

## 2023-01-10 DIAGNOSIS — Z20822 Contact with and (suspected) exposure to covid-19: Secondary | ICD-10-CM | POA: Diagnosis not present

## 2023-01-10 DIAGNOSIS — E785 Hyperlipidemia, unspecified: Secondary | ICD-10-CM | POA: Diagnosis not present

## 2023-01-10 DIAGNOSIS — Z136 Encounter for screening for cardiovascular disorders: Secondary | ICD-10-CM | POA: Diagnosis not present

## 2023-01-12 ENCOUNTER — Encounter: Payer: Self-pay | Admitting: Medical

## 2023-01-12 ENCOUNTER — Ambulatory Visit: Payer: Medicare Other | Attending: Cardiovascular Disease | Admitting: Medical

## 2023-01-12 VITALS — BP 110/80 | HR 108 | Ht 64.5 in | Wt 257.0 lb

## 2023-01-12 DIAGNOSIS — Z8673 Personal history of transient ischemic attack (TIA), and cerebral infarction without residual deficits: Secondary | ICD-10-CM | POA: Diagnosis not present

## 2023-01-12 DIAGNOSIS — I4811 Longstanding persistent atrial fibrillation: Secondary | ICD-10-CM | POA: Diagnosis not present

## 2023-01-12 DIAGNOSIS — I5020 Unspecified systolic (congestive) heart failure: Secondary | ICD-10-CM | POA: Insufficient documentation

## 2023-01-12 DIAGNOSIS — I2510 Atherosclerotic heart disease of native coronary artery without angina pectoris: Secondary | ICD-10-CM | POA: Diagnosis not present

## 2023-01-12 DIAGNOSIS — L819 Disorder of pigmentation, unspecified: Secondary | ICD-10-CM | POA: Diagnosis not present

## 2023-01-12 DIAGNOSIS — E782 Mixed hyperlipidemia: Secondary | ICD-10-CM | POA: Insufficient documentation

## 2023-01-12 DIAGNOSIS — I428 Other cardiomyopathies: Secondary | ICD-10-CM | POA: Diagnosis not present

## 2023-01-12 DIAGNOSIS — I4821 Permanent atrial fibrillation: Secondary | ICD-10-CM | POA: Insufficient documentation
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
## 2023-01-12 NOTE — Patient Instructions (Signed)
Medication Instructions:  - Your physician recommends that you continue on your current medications as directed. Please refer to the Current Medication list given to you today.  *If you need a refill on your cardiac medications before your next appointment, please call your pharmacy*   Lab Work: - none ordered  If you have labs (blood work) drawn today and your tests are completely normal, you will receive your results only by: [place] (if you have MyChart) OR A paper copy in the mail If you have any lab test that is abnormal or we need to change your treatment, we will call you to review the results.   Testing/Procedures: - Your physician has requested that you have an ankle brachial index (ABI). During this test an ultrasound and blood pressure cuff are used to evaluate the arteries that supply the arms and legs with blood. Allow thirty minutes for this exam. There are no restrictions or special instructions.  - Your physician has requested that you have a lower extremity arterial duplex. This test is an ultrasound of the arteries in the legs. It looks at arterial blood flow in the legs. Allow one hour for Lower Arterial scans. There are no restrictions or special instructions    Follow-Up: At [facility], you and your health needs are our priority.  As part of our continuing mission to provide you with exceptional heart care, we have created designated Provider Care Teams.  These Care Teams include your primary Cardiologist (physician) and Advanced Practice Providers (APPs -  Physician Assistants and Nurse Practitioners) who all work together to provide you with the care you need, when you need it.  We recommend signing up for the patient portal called "MyChart".  Sign up information is provided on this After Visit Summary.  MyChart is used to connect with patients for Virtual Visits (Telemedicine).  Patients are able to view lab/test results, encounter notes, upcoming  appointments, etc.  Non-urgent messages can be sent to your provider as well.   To learn more about what you can do with MyChart, go to [email or web address].    Your next appointment:   2-3 month(s)  Provider:   You may see Kathlyn [place], MD or one of the following Advanced Practice Providers on your designated Care Team:    Cadence Kathlen Mody, [place]   Other Instructions Ankle-Brachial Index Test Why am I having this test? The ankle-brachial index (ABI) test is used to diagnose peripheral vascular disease (PVD). PVD is also known as peripheral arterial disease (PAD). PVD is the blocking or hardening of the arteries anywhere within the circulatory system beyond the heart. It is a form of cardiovascular disease. PVD is caused by: Cholesterol deposits in your blood vessels (atherosclerosis). This is the most common cause of this condition. Inflammation in the blood vessels. Blood clots in the vessels. Cholesterol deposits cause arteries to narrow. Decreased delivery of oxygen to your tissues causes muscle pain, cramping, and fatigue that occurs during exercise and is relieved by rest. This is called intermittent claudication. PVD means that there may also be buildup of cholesterol: In your heart. This raises the risk of heart attacks. In your brain. This raises the risk of strokes. What is being tested? The ankle-brachial index test measures the blood flow in your arms and legs. The blood flow will show if blood vessels in your legs have been narrowed by cholesterol deposits. How do I prepare for this test? Wear loose, comfortable clothing with shoes that are easy to  remove. Do not use any products that contain nicotine or tobacco for at least 30 minutes before the test. These products include cigarettes, chewing tobacco, and vaping devices, such as e-cigarettes. Avoid caffeine, alcohol, and heavy activity an hour before the test. What happens during the test?  You will lie down in a  resting position with your legs and arms at heart level. Your health care provider will use a blood pressure machine and a small ultrasound device (Doppler) to measure the systolic pressures on your upper arms and ankles. Systolic pressure is the pressure inside your arteries when your heart pumps. Systolic pressures will be taken several times, and at several points, on both the ankle and the arm. Your health care provider may also measure your toe. Your health care provider will divide the highest systolic pressure of the ankle by the highest systolic pressure of the arm. The result is the ankle-brachial pressure ratio, or ABI. Sometimes this test will be repeated after you have exercised on a treadmill for 5 minutes. You may have leg pain during the exercise portion of the test if you suffer from PVD. If the index number drops after exercise, this may show that PVD is present. How are the results reported? Your test results will be reported as a value that shows the ratio of your ankle pressure to your arm pressure (ABI ratio). Your health care provider will compare your results to normal ranges that were established after testing a large group of people (reference ranges). These ranges may vary among labs and hospitals. For this test, an abnormal reading is typically considered, which is an ABI ratio of 0.9 or less. What do the results mean? An ABI ratio that is below the reference range is considered abnormal and may indicate PVD in the legs. Talk with your health care provider about what your results mean. Questions to ask your health care provider Ask your health care provider, or the department that is doing the test: When will my results be ready? How will I get my results? What are my treatment options? What other tests do I need? What are my next steps? Summary The ankle-brachial index (ABI) test is used to diagnose peripheral vascular disease (PVD). PVD is also known as peripheral  arterial disease (PAD). The ABI test measures the blood flow in your arms and legs. The highest systolic pressure of the ankle is divided by the highest systolic pressure of the arm. The result is the ABI ratio. An ABI ratio of 0.9 or less is considered abnormal and may indicate PVD in the legs. This information is not intended to replace advice given to you by your health care provider. Make sure you discuss any questions you have with your health care provider. Document Revised: 06/11/2020 Document Reviewed: 06/11/2020 Elsevier Patient Education  [place].

## 2023-01-14 ENCOUNTER — Ambulatory Visit: Payer: Medicare Other | Admitting: Physician Assistant

## 2023-01-19 ENCOUNTER — Ambulatory Visit (INDEPENDENT_AMBULATORY_CARE_PROVIDER_SITE_OTHER): Payer: Medicare Other | Admitting: Physician Assistant

## 2023-01-19 ENCOUNTER — Telehealth: Payer: Self-pay

## 2023-01-19 ENCOUNTER — Encounter: Payer: Self-pay | Admitting: Physician Assistant

## 2023-01-19 VITALS — BP 134/72 | HR 63 | Temp 97.9°F | Resp 16 | Ht 64.0 in | Wt 255.8 lb

## 2023-01-19 DIAGNOSIS — J9611 Chronic respiratory failure with hypoxia: Secondary | ICD-10-CM | POA: Diagnosis not present

## 2023-01-19 DIAGNOSIS — R0602 Shortness of breath: Secondary | ICD-10-CM | POA: Diagnosis not present

## 2023-01-19 DIAGNOSIS — J449 Chronic obstructive pulmonary disease, unspecified: Secondary | ICD-10-CM
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
## 2023-01-19 NOTE — Telephone Encounter (Signed)
Sent message to Ashly from [place] for oxygen

## 2023-01-26 ENCOUNTER — Ambulatory Visit (INDEPENDENT_AMBULATORY_CARE_PROVIDER_SITE_OTHER): Payer: Medicare Other | Admitting: Internal Medicine

## 2023-01-26 ENCOUNTER — Encounter: Payer: Self-pay | Admitting: Internal Medicine

## 2023-01-26 VITALS — BP 136/64 | HR 54 | Temp 97.8°F | Resp 16 | Ht 64.0 in | Wt 255.0 lb

## 2023-01-26 DIAGNOSIS — J9611 Chronic respiratory failure with hypoxia: Secondary | ICD-10-CM | POA: Diagnosis not present

## 2023-01-26 DIAGNOSIS — J449 Chronic obstructive pulmonary disease, unspecified: Secondary | ICD-10-CM | POA: Diagnosis not present

## 2023-01-26 DIAGNOSIS — R600 Localized edema: Secondary | ICD-10-CM
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
## 2023-01-26 NOTE — Patient Instructions (Signed)
Chronic Obstructive Pulmonary Disease  Chronic obstructive pulmonary disease (COPD) is a long-term (chronic) lung problem. When you have COPD, it is hard for air to get in and out of your lungs. Usually the condition gets worse over time, and your lungs will never return to normal. There are things you can do to keep yourself as healthy as possible. What are the causes? Smoking. This is the most common cause. Certain genes passed from parent to child (inherited). What increases the risk? Being exposed to secondhand smoke from cigarettes, pipes, or cigars. Being exposed to chemicals and other irritants, such as fumes and dust in the work environment. Having chronic lung conditions or infections. What are the signs or symptoms? Shortness of breath, especially during physical activity. A long-term cough with a large amount of thick mucus. Sometimes, the cough may not have any mucus (dry cough). Wheezing. Breathing quickly. Skin that looks gray or blue, especially in the fingers, toes, or lips. Feeling tired (fatigue). Weight loss. Chest tightness. Having infections often. Episodes when breathing symptoms become much worse (exacerbations). At the later stages of this disease, you may have swelling in the ankles, feet, or legs. How is this treated? Taking medicines. Quitting smoking, if you smoke. Rehabilitation. This includes steps to make your body work better. It may involve a team of specialists. Doing exercises. Making changes to your diet. Using oxygen. Lung surgery. Lung transplant. Comfort measures (palliative care). Follow these instructions at home: Medicines Take over-the-counter and prescription medicines only as told by your doctor. Talk to your doctor before taking any cough or allergy medicines. You may need to avoid medicines that cause your lungs to be dry. Lifestyle If you smoke, stop smoking. Smoking makes the problem worse. Do not smoke or use any products that  contain nicotine or tobacco. If you need help quitting, ask your doctor. Avoid being around things that make your breathing worse. This may include smoke, chemicals, and fumes. Stay active, but remember to rest as well. Learn and use tips on how to manage stress and control your breathing. Make sure you get enough sleep. Most adults need at least 7 hours of sleep every night. Eat healthy foods. Eat smaller meals more often. Rest before meals. Controlled breathing Learn and use tips on how to control your breathing as told by your doctor. Try: Breathing in (inhaling) through your nose for 1 second. Then, pucker your lips and breath out (exhale) through your lips for 2 seconds. Putting one hand on your belly (abdomen). Breathe in slowly through your nose for 1 second. Your hand on your belly should move out. Pucker your lips and breathe out slowly through your lips. Your hand on your belly should move in as you breathe out.  Controlled coughing Learn and use controlled coughing to clear mucus from your lungs. Follow these steps: Lean your head a little forward. Breathe in deeply. Try to hold your breath for 3 seconds. Keep your mouth slightly open while coughing 2 times. Spit any mucus out into a tissue. Rest and do the steps again 1 or 2 times as needed. General instructions Make sure you get all the shots (vaccines) that your doctor recommends. Ask your doctor about a flu shot and a pneumonia shot. Use oxygen therapy and pulmonary rehabilitation if told by your doctor. If you need home oxygen therapy, ask your doctor if you should buy a tool to measure your oxygen level (oximeter). Make a COPD action plan with your doctor. This helps you   to know what to do if you feel worse than usual. Manage any other conditions you have as told by your doctor. Avoid going outside when it is very hot, cold, or humid. Avoid people who have a sickness you can catch (contagious). Keep all follow-up  visits. Contact a doctor if: You cough up more mucus than usual. There is a change in the color or thickness of the mucus. It is harder to breathe than usual. Your breathing is faster than usual. You have trouble sleeping. You need to use your medicines more often than usual. You have trouble doing your normal activities such as getting dressed or walking around the house. Get help right away if: You have shortness of breath while resting. You have shortness of breath that stops you from: Being able to talk. Doing normal activities. Your chest hurts for longer than 5 minutes. Your skin color is more blue than usual. Your pulse oximeter shows that you have low oxygen for longer than 5 minutes. You have a fever. You feel too tired to breathe normally. These symptoms may represent a serious problem that is an emergency. Do not wait to see if the symptoms will go away. Get medical help right away. Call your local emergency services (911 in the [place] Do not drive yourself to the hospital. Summary Chronic obstructive pulmonary disease (COPD) is a long-term lung problem. The way your lungs work will never return to normal. Usually the condition gets worse over time. There are things you can do to keep yourself as healthy as possible. Take over-the-counter and prescription medicines only as told by your doctor. If you smoke, stop. Smoking makes the problem worse. This information is not intended to replace advice given to you by your health care provider. Make sure you discuss any questions you have with your health care provider. Document Revised: 10/16/2020 Document Reviewed: 10/16/2020 [employer] Patient Education  2023 [employer]  

## 2023-01-28 ENCOUNTER — Ambulatory Visit: Payer: Self-pay

## 2023-01-28 ENCOUNTER — Telehealth: Payer: Self-pay

## 2023-01-28 NOTE — Telephone Encounter (Signed)
Spoke with [facility] order to get POC portable oxygen pt need to used 8 tank for 3 months  advised that [place] home pt will call her for oxygen deliver once she used 8 tanks they can apply to insurance to get POC

## 2023-01-28 NOTE — Patient Outreach (Signed)
  Care Coordination   Follow Up Visit Note   01/28/2023 Name: GALINA HADDOX MRN: [id number] DOB: [birth year]  SALVATRICE MORANDI is a 72 y.o. year old female who sees Jonetta Osgood, NP for primary care. I spoke with  Beverly Sessions by phone today.  What matters to the patients health and wellness today?  Patient states she does not want to participate in the care coordination program.   Goals Addressed             This Visit's Progress    Management/ education  of health conditions       Care Coordination Interventions: Message sent to patients primary care provider to inform her patient does not want to participate in care coordination program.            SDOH assessments and interventions completed:  No     Care Coordination Interventions:  Yes, provided   Follow up plan: No further intervention required.   Encounter Outcome:  Pt. Visit Completed

## 2023-01-29 ENCOUNTER — Other Ambulatory Visit: Payer: Self-pay | Admitting: Nurse Practitioner

## 2023-01-29 DIAGNOSIS — I4811 Longstanding persistent atrial fibrillation: Secondary | ICD-10-CM

## 2023-02-02 ENCOUNTER — Ambulatory Visit: Payer: Medicare Other | Admitting: Nurse Practitioner

## 2023-02-11 ENCOUNTER — Other Ambulatory Visit (INDEPENDENT_AMBULATORY_CARE_PROVIDER_SITE_OTHER): Payer: Self-pay | Admitting: Vascular Surgery

## 2023-02-11 DIAGNOSIS — M7989 Other specified soft tissue disorders: Secondary | ICD-10-CM

## 2023-02-12 ENCOUNTER — Ambulatory Visit (INDEPENDENT_AMBULATORY_CARE_PROVIDER_SITE_OTHER): Payer: Medicare Other | Admitting: Vascular Surgery

## 2023-02-12 ENCOUNTER — Encounter (INDEPENDENT_AMBULATORY_CARE_PROVIDER_SITE_OTHER): Payer: Self-pay | Admitting: Vascular Surgery

## 2023-02-12 ENCOUNTER — Ambulatory Visit (INDEPENDENT_AMBULATORY_CARE_PROVIDER_SITE_OTHER): Payer: Medicare Other

## 2023-02-12 VITALS — BP 99/63 | HR 82 | Resp 20 | Ht 64.0 in | Wt 250.0 lb

## 2023-02-12 DIAGNOSIS — I2510 Atherosclerotic heart disease of native coronary artery without angina pectoris: Secondary | ICD-10-CM

## 2023-02-12 DIAGNOSIS — I10 Essential (primary) hypertension: Secondary | ICD-10-CM

## 2023-02-12 DIAGNOSIS — M7989 Other specified soft tissue disorders: Secondary | ICD-10-CM | POA: Diagnosis not present

## 2023-02-12 DIAGNOSIS — I872 Venous insufficiency (chronic) (peripheral): Secondary | ICD-10-CM

## 2023-02-12 DIAGNOSIS — I6523 Occlusion and stenosis of bilateral carotid arteries: Secondary | ICD-10-CM | POA: Diagnosis not present

## 2023-02-12 DIAGNOSIS — I890 Lymphedema, not elsewhere classified: Secondary | ICD-10-CM | POA: Diagnosis not present

## 2023-02-12 DIAGNOSIS — I739 Peripheral vascular disease, unspecified: Secondary | ICD-10-CM | POA: Diagnosis not present

## 2023-02-12 DIAGNOSIS — I482 Chronic atrial fibrillation, unspecified: Secondary | ICD-10-CM

## 2023-02-12 NOTE — Progress Notes (Signed)
MRN : [id number]  Laura [name] is a 72 y.o. (1951/02/11) female who presents with chief complaint of legs swell.  History of Present Illness:   The patient returns to the office for followup evaluation regarding leg swelling.  The swelling has persisted and the pain associated with swelling continues. There have not been any interval development of a ulcerations or wounds.  Since the previous visit the patient has been wearing graduated compression stockings and has noted little if any improvement in the lymphedema. The patient has been using compression routinely morning until night.  The patient also states elevation during the day and exercise is being done too.  No outpatient medications have been marked as taking for the 02/12/23 encounter (Appointment) with Delana Meyer, Dolores Lory, MD.    Past Medical History:  Diagnosis Date   Asthma    Chronic atrial fibrillation ([place])    a. diagnosed in 09/2016; b. failed flecainide and propafenone due to LE swelling and SOB, could not afford Multaq; c. CHADS2VASc => 5 (CHF, HTN, age x 1, nonobs CAD, female)--> Eliquis   COPD (chronic obstructive pulmonary disease) (HCC)    GERD (gastroesophageal reflux disease)    HFmrEF (heart failure with mid-range ejection fraction) ([place])    a. 12/2019 Echo: EF 40-45%. b. 01/2022 Echo: EF 40-45%.   Hyperlipidemia    Hypertension    NICM (nonischemic cardiomyopathy) ([place])    a. 12/2019 Echo: EF 40-45%, glob HK, mildly reduced RV fxn, sev dil LA. *HR 130 (afib) during study.   Nonobstructive CAD (coronary artery disease)    a. Lexiscan Myoview 10/2016: no evidence of ischemia, EF 53%; b. 02/2020 Cath: LM nl, LAD 20p, 90m LCX 20ost, OM1/2/3 nl, LPDA nl, LPL1/2 nl, LPAV nl, RCA small, nl.   Obesity    Obstructive sleep apnea    Pulmonary hypertension ([place]    Sleep apnea    [place]    Systolic dysfunction    a. TTE 10/2016: EF 50%, mild LVH, moderately dilated LA, moderate MR/TR, mild pulmonary  hypertension    Past Surgical History:  Procedure Laterality Date   BOWEL RESECTION  09/11/2019   Procedure: SMALL BOWEL RESECTION;  Surgeon: CHerbert Pun MD;  Location: [facility];  Service: General;;   CARDIAC CATHETERIZATION     cataract surgery     COLONOSCOPY WITH PROPOFOL N/A 03/19/2020   Procedure: COLONOSCOPY WITH PROPOFOL;  Surgeon: AJonathon Bellows MD;  [facility]  Service: Gastroenterology;  Laterality: N/A;   CORONARY ANGIOPLASTY     INCISION AND DRAINAGE ABSCESS Right 06/29/2016   Procedure: INCISION AND DRAINAGE ABSCESS;  Surgeon: RFlorene Glen MD;  Location: [facility];  Service: General;  Laterality: Right;   INCISION AND DRAINAGE OF WOUND Left 06/29/2016   Procedure: IRRIGATION AND DEBRIDEMENT WOUND;  Surgeon: RFlorene Glen MD;  Location: [facility];  Service: General;  Laterality: Left;   IR ANGIO VERTEBRAL SEL SUBCLAVIAN INNOMINATE UNI R MOD SED  11/20/2020   IR CT HEAD LTD  11/20/2020   IR PERCUTANEOUS ART THROMBECTOMY/INFUSION INTRACRANIAL INC DIAG ANGIO  11/20/2020   LAPAROSCOPIC RIGHT COLECTOMY  09/11/2019   Procedure: RIGHT COLECTOMY;  Surgeon: CHerbert Pun MD;  Location: [facility];  Service: General;;   LAPAROSCOPY N/A 09/11/2019   Procedure: LAPAROSCOPY DIAGNOSTIC;  Surgeon: CHerbert Pun MD;  Location: [facility];  Service: General;  Laterality: N/A;   LAPAROTOMY N/A 09/13/2019   Procedure: REOPENING OF RECENT LAPAROTOMYANASTOMOSIS OF BOWEL;  Surgeon: CHerbert Pun MD;  Location: [facility];  Service: General;  Laterality: N/A;   RADIOLOGY WITH ANESTHESIA N/A 11/20/2020   Procedure: IR WITH ANESTHESIA - CODE STROKE;  Surgeon: Radiologist, Medication, MD;  Location: [place];  Service: Radiology;  Laterality: N/A;   RIGHT/LEFT HEART CATH AND CORONARY ANGIOGRAPHY Bilateral 02/27/2020   Procedure: RIGHT/LEFT HEART CATH AND CORONARY ANGIOGRAPHY;  Surgeon: Wellington Hampshire, MD;  Location: [place] [facility];  Service:  Cardiovascular;  Laterality: Bilateral;   VISCERAL ANGIOGRAPHY N/A 09/12/2019   Procedure: VISCERAL ANGIOGRAPHY;  Surgeon: Algernon Huxley, MD;  Location: [place] [facility];  Service: Cardiovascular;  Laterality: N/A;    Social History Social History   Tobacco Use   Smoking status: Never   Smokeless tobacco: Never  Vaping Use   Vaping Use: Never used  Substance Use Topics   Alcohol use: No   Drug use: No    Family History Family History  Problem Relation Age of Onset   Dementia Mother    Osteoporosis Mother    Vascular Disease Mother    COPD Father    Heart disease Brother    Cancer Daughter     Allergies  Allergen Reactions   Flecainide Shortness Of Breath   Metoprolol Shortness Of Breath, Swelling and Other (See Comments)    "My limbs swell" also   Propafenone Shortness Of Breath, Swelling and Other (See Comments)    "My limbs swell" also   Rivaroxaban Swelling and Other (See Comments)    Xarelto- "My limbs swell" Other reaction(s): Muscle Pain, Other (See Comments) Other reaction(s): Other (See Comments) Xarelto- "My limbs swell"     REVIEW OF SYSTEMS (Negative unless checked)  Constitutional: '[]'$ Weight loss  '[]'$ Fever  '[]'$ Chills Cardiac: '[]'$ Chest pain   '[]'$ Chest pressure   '[]'$ Palpitations   '[]'$ Shortness of breath when laying flat   '[]'$ Shortness of breath with exertion. Vascular:  '[]'$ Pain in legs with walking   '[x]'$ Pain in legs with standing  '[]'$ History of DVT   '[]'$ Phlebitis   '[x]'$ Swelling in legs   '[]'$ Varicose veins   '[]'$ Non-healing ulcers Pulmonary:   '[]'$ Uses home oxygen   '[]'$ Productive cough   '[]'$ Hemoptysis   '[]'$ Wheeze  '[]'$ COPD   '[]'$ Asthma Neurologic:  '[]'$ Dizziness   '[]'$ Seizures   '[]'$ History of stroke   '[]'$ History of TIA  '[]'$ Aphasia   '[]'$ Vissual changes   '[]'$ Weakness or numbness in arm   '[]'$ Weakness or numbness in leg Musculoskeletal:   '[]'$ Joint swelling   '[]'$ Joint pain   '[]'$ Low back pain Hematologic:  '[]'$ Easy bruising  '[]'$ Easy bleeding   '[]'$ Hypercoagulable state   '[]'$ Anemic Gastrointestinal:   '[]'$ Diarrhea   '[]'$ Vomiting  '[]'$ Gastroesophageal reflux/heartburn   '[]'$ Difficulty swallowing. Genitourinary:  '[]'$ Chronic kidney disease   '[]'$ Difficult urination  '[]'$ Frequent urination   '[]'$ Blood in urine Skin:  '[]'$ Rashes   '[]'$ Ulcers  Psychological:  '[]'$ History of anxiety   '[]'$  History of major depression.  Physical Examination  There were no vitals filed for this visit. There is no height or weight on file to calculate BMI. Gen: WD/WN, NAD Head: [place]/AT, No temporalis wasting.  Ear/Nose/Throat: Hearing grossly intact, nares w/o erythema or drainage, pinna without lesions Eyes: PER, EOMI, sclera nonicteric.  Neck: Supple, no gross masses.  No JVD.  Pulmonary:  Good air movement, no audible wheezing, no use of accessory muscles.  Cardiac: RRR, precordium not hyperdynamic. Vascular:  scattered varicosities present bilaterally.  Mild venous stasis changes to the legs bilaterally.  3-4+ soft pitting edema, CEAP C4sEpAsPr  Vessel Right Left  Radial Palpable Palpable  Gastrointestinal: soft, non-distended.  No guarding/no peritoneal signs.  Musculoskeletal: M/S 5/5 throughout.  No deformity.  Neurologic: CN 2-12 intact. Pain and light touch intact in extremities.  Symmetrical.  Speech is fluent. Motor exam as listed above. Psychiatric: Judgment intact, Mood & affect appropriate for pt's clinical situation. Dermatologic: Venous rashes no ulcers noted.  No changes consistent with cellulitis. Lymph : No lichenification or skin changes of chronic lymphedema.  CBC Lab Results  Component Value Date   WBC 13.9 (H) 01/03/2022   HGB 15.1 (H) 01/03/2022   HCT 43.8 01/03/2022   MCV 94.2 01/03/2022   PLT 296 01/03/2022    BMET    Component Value Date/Time   NA 145 (H) 10/31/2022 1437   NA 140 11/28/2013 2312   K 4.3 10/31/2022 1437   K 4.1 11/28/2013 2312   CL 105 10/31/2022 1437   CL 108 (H) 11/28/2013 2312   CO2 20 10/31/2022 1437   CO2 27 11/28/2013 2312   GLUCOSE 126 (H) 10/31/2022 1437   GLUCOSE 186  (H) 07/11/2022 1528   GLUCOSE 130 (H) 11/28/2013 2312   BUN 16 10/31/2022 1437   BUN 29 (H) 11/28/2013 2312   CREATININE 1.06 (H) 10/31/2022 1437   CREATININE 0.79 11/28/2013 2312   CALCIUM 9.6 10/31/2022 1437   CALCIUM 8.7 11/28/2013 2312   GFRNONAA 55 (L) 07/11/2022 1528   GFRNONAA >60 11/28/2013 2312   GFRAA 80 12/04/2020 1517   GFRAA >60 11/28/2013 2312   CrCl cannot be calculated (Patient's most recent lab result is older than the maximum 21 days allowed.).  COAG Lab Results  Component Value Date   INR 1.2 11/20/2020   INR 3.6 (A) 06/13/2020   INR 3.9 (A) 06/11/2020    Radiology No results found.   Assessment/Plan 1. Lymphedema Recommend:  No surgery or intervention at this point in time.   The Patient is CEAP C4sEpAsPr.  The patient has been wearing compression for more than 12 weeks with no or little benefit.  The patient has been exercising daily for more than 12 weeks. The patient has been elevating and taking OTC pain medications for more than 12 weeks.  None of these have have eliminated the pain related to the lymphedema or the discomfort regarding excessive swelling and venous congestion.    I have reviewed my discussion with the patient regarding lymphedema and why it  causes symptoms.  Patient will continue wearing graduated compression on a daily basis. The patient should put the compression on first thing in the morning and removing them in the evening. The patient should not sleep in the compression.   In addition, behavioral modification throughout the day will be continued.  This will include frequent elevation (such as in a recliner), use of over the counter pain medications as needed and exercise such as walking.  The systemic causes for chronic edema such as liver, kidney and cardiac etiologies do not appear to have significant changed over the past year.    The patient has chronic , severe lymphedema with hyperpigmentation of the skin and has done  MLD, skin care, medication, diet, exercise, elevation and compression for 4 weeks with no improvement,  I am recommending a lymphedema pump.  The patient still has stage 3 lymphedema and therefore, I believe that a lymph pump is needed to improve the control of the patient's lymphedema and improve the quality of life.  Additionally, a lymph pump is warranted because it will reduce the risk of cellulitis and ulceration in the future.  Patient should follow-up in six months   2. Chronic venous insufficiency Recommend:  No surgery or intervention at this point in time.   The Patient is CEAP C4sEpAsPr.  The patient has been wearing compression for more than 12 weeks with no or little benefit.  The patient has been exercising daily for more than 12 weeks. The patient has been elevating and taking OTC pain medications for more than 12 weeks.  None of these have have eliminated the pain related to the lymphedema or the discomfort regarding excessive swelling and venous congestion.    I have reviewed my discussion with the patient regarding lymphedema and why it  causes symptoms.  Patient will continue wearing graduated compression on a daily basis. The patient should put the compression on first thing in the morning and removing them in the evening. The patient should not sleep in the compression.   In addition, behavioral modification throughout the day will be continued.  This will include frequent elevation (such as in a recliner), use of over the counter pain medications as needed and exercise such as walking.  The systemic causes for chronic edema such as liver, kidney and cardiac etiologies do not appear to have significant changed over the past year.    The patient has chronic , severe lymphedema with hyperpigmentation of the skin and has done MLD, skin care, medication, diet, exercise, elevation and compression for 4 weeks with no improvement,  I am recommending a lymphedema pump.  The patient  still has stage 3 lymphedema and therefore, I believe that a lymph pump is needed to improve the control of the patient's lymphedema and improve the quality of life.  Additionally, a lymph pump is warranted because it will reduce the risk of cellulitis and ulceration in the future.  Patient should follow-up in six months   3. PAD (peripheral artery disease) (HCC) Recommend:  The patient has atypical pain symptoms for vascular disease and on exam I do not find evidence of vascular pathology that would explain the patient's symptoms.  Noninvasive studies do not identify significant vascular problems  I suspect the patient is c/o pseudoclaudication.  Patient should have an evaluation of the LS spine which I defer to the primary service or the Spine service.  The patient should continue walking and begin a more formal exercise program. The patient should continue his antiplatelet therapy and aggressive treatment of the lipid abnormalities.  4. Atherosclerosis of both carotid arteries Recommend:  Given the patient's asymptomatic subcritical stenosis no further invasive testing or surgery at this time.  Duplex ultrasound shows 1-39% stenosis bilaterally.  Continue antiplatelet therapy as prescribed Continue management of CAD, HTN and Hyperlipidemia Healthy heart diet,  encouraged exercise at least 4 times per week Follow up in 24 months with duplex ultrasound and physical exam   5. Essential hypertension Continue antihypertensive medications as already ordered, these medications have been reviewed and there are no changes at this time.  6. Chronic atrial fibrillation (HCC) Continue antiarrhythmia medications as already ordered, these medications have been reviewed and there are no changes at this time.  Continue anticoagulation as ordered by Cardiology Service   Hortencia Pilar, MD  02/12/2023 9:47 AM

## 2023-02-13 ENCOUNTER — Encounter: Payer: Self-pay | Admitting: Nurse Practitioner

## 2023-02-13 ENCOUNTER — Telehealth (INDEPENDENT_AMBULATORY_CARE_PROVIDER_SITE_OTHER): Payer: Medicare Other | Admitting: Physician Assistant

## 2023-02-13 ENCOUNTER — Encounter: Payer: Self-pay | Admitting: Physician Assistant

## 2023-02-13 VITALS — Resp 16 | Ht 64.0 in | Wt 250.0 lb

## 2023-02-13 DIAGNOSIS — J441 Chronic obstructive pulmonary disease with (acute) exacerbation: Secondary | ICD-10-CM | POA: Diagnosis not present

## 2023-02-13 MED ORDER — PREDNISONE 10 MG PO TABS: ORAL_TABLET | ORAL | 0 refills | Status: DC

## 2023-02-13 MED ORDER — AZITHROMYCIN 250 MG PO TABS: ORAL_TABLET | ORAL | 0 refills | Status: DC
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
## 2023-02-13 NOTE — Telephone Encounter (Signed)
Spoke with pt make her  mychart video appt

## 2023-02-14 ENCOUNTER — Encounter (INDEPENDENT_AMBULATORY_CARE_PROVIDER_SITE_OTHER): Payer: Self-pay | Admitting: Vascular Surgery

## 2023-02-22 ENCOUNTER — Other Ambulatory Visit: Payer: Self-pay | Admitting: Nurse Practitioner

## 2023-02-22 DIAGNOSIS — J9611 Chronic respiratory failure with hypoxia: Secondary | ICD-10-CM

## 2023-02-24 DIAGNOSIS — M47819 Spondylosis without myelopathy or radiculopathy, site unspecified: Secondary | ICD-10-CM | POA: Insufficient documentation

## 2023-02-24 DIAGNOSIS — M21371 Foot drop, right foot: Secondary | ICD-10-CM | POA: Insufficient documentation

## 2023-02-24 DIAGNOSIS — M461 Sacroiliitis, not elsewhere classified: Secondary | ICD-10-CM | POA: Insufficient documentation

## 2023-02-24 DIAGNOSIS — M62830 Muscle spasm of back: Secondary | ICD-10-CM | POA: Insufficient documentation

## 2023-02-25 DIAGNOSIS — M479 Spondylosis, unspecified: Secondary | ICD-10-CM | POA: Diagnosis not present

## 2023-02-25 DIAGNOSIS — M461 Sacroiliitis, not elsewhere classified: Secondary | ICD-10-CM | POA: Diagnosis not present

## 2023-02-25 DIAGNOSIS — M62830 Muscle spasm of back: Secondary | ICD-10-CM | POA: Diagnosis not present

## 2023-02-25 DIAGNOSIS — M545 Low back pain, unspecified: Secondary | ICD-10-CM | POA: Diagnosis not present

## 2023-02-25 DIAGNOSIS — M21371 Foot drop, right foot: Secondary | ICD-10-CM | POA: Diagnosis not present

## 2023-02-26 ENCOUNTER — Telehealth: Payer: Self-pay | Admitting: Cardiovascular Disease

## 2023-02-26 ENCOUNTER — Encounter: Payer: Self-pay | Admitting: Nurse Practitioner

## 2023-02-26 NOTE — Telephone Encounter (Signed)
LMOV to reschedule LE Art on 03/08 Machine is down til next week

## 2023-03-02 MED ORDER — THEOPHYLLINE ER 100 MG PO TB12: 100.0000 mg | ORAL_TABLET | Freq: Two times a day (BID) | ORAL | 5 refills | Status: DC

## 2023-03-05 ENCOUNTER — Other Ambulatory Visit: Payer: Self-pay | Admitting: Nurse Practitioner

## 2023-03-05 ENCOUNTER — Other Ambulatory Visit: Payer: Self-pay | Admitting: Cardiovascular Disease

## 2023-03-05 ENCOUNTER — Telehealth: Payer: Self-pay

## 2023-03-05 DIAGNOSIS — Z760 Encounter for issue of repeat prescription: Secondary | ICD-10-CM

## 2023-03-05 NOTE — Telephone Encounter (Signed)
Refill request

## 2023-03-05 NOTE — Telephone Encounter (Signed)
Prescription refill request for Eliquis received. Indication: AF Last office visit: 01/12/23  Read Drivers PA-C Scr: 1.21 on 01/10/23 Age: 72 Weight: 116.6kg  Based on above findings Eliquis '5mg'$  twice daily is the appropriate dose.  Refill approved.

## 2023-03-05 NOTE — Telephone Encounter (Signed)
PA was done for Theo-24.

## 2023-03-05 NOTE — Telephone Encounter (Signed)
PA for Theo-24 was approved and patient was notified.

## 2023-03-11 DIAGNOSIS — M533 Sacrococcygeal disorders, not elsewhere classified: Secondary | ICD-10-CM | POA: Diagnosis not present

## 2023-03-13 ENCOUNTER — Other Ambulatory Visit: Payer: Self-pay

## 2023-03-13 DIAGNOSIS — J9611 Chronic respiratory failure with hypoxia: Secondary | ICD-10-CM

## 2023-03-13 MED ORDER — THEO-24 100 MG PO CP24: 100.0000 mg | ORAL_CAPSULE | Freq: Every day | ORAL | 5 refills | Status: DC

## 2023-03-16 ENCOUNTER — Other Ambulatory Visit: Payer: Self-pay | Admitting: Internal Medicine

## 2023-03-16 DIAGNOSIS — J209 Acute bronchitis, unspecified: Secondary | ICD-10-CM

## 2023-03-24 ENCOUNTER — Other Ambulatory Visit: Payer: Self-pay | Admitting: Physician Assistant

## 2023-03-24 ENCOUNTER — Other Ambulatory Visit: Payer: Self-pay | Admitting: Cardiovascular Disease

## 2023-03-24 DIAGNOSIS — J441 Chronic obstructive pulmonary disease with (acute) exacerbation: Secondary | ICD-10-CM

## 2023-03-24 NOTE — Telephone Encounter (Signed)
Last visit with Laura [name] on 01/12/23--no change in med  F/u scheduled 04/20/23

## 2023-04-03 ENCOUNTER — Encounter: Payer: Self-pay | Admitting: Nurse Practitioner

## 2023-04-03 ENCOUNTER — Ambulatory Visit: Payer: Medicare Other | Admitting: Medical

## 2023-04-03 ENCOUNTER — Ambulatory Visit (INDEPENDENT_AMBULATORY_CARE_PROVIDER_SITE_OTHER): Payer: Medicare Other | Admitting: Nurse Practitioner

## 2023-04-03 ENCOUNTER — Other Ambulatory Visit: Payer: Self-pay | Admitting: *Deleted

## 2023-04-03 ENCOUNTER — Encounter: Payer: Self-pay | Admitting: Cardiovascular Disease

## 2023-04-03 VITALS — BP 129/90 | HR 80 | Temp 97.5°F | Resp 16 | Ht 64.0 in | Wt 248.6 lb

## 2023-04-03 DIAGNOSIS — R7303 Prediabetes: Secondary | ICD-10-CM

## 2023-04-03 DIAGNOSIS — J0110 Acute frontal sinusitis, unspecified: Secondary | ICD-10-CM | POA: Diagnosis not present

## 2023-04-03 DIAGNOSIS — I10 Essential (primary) hypertension: Secondary | ICD-10-CM | POA: Diagnosis not present

## 2023-04-03 DIAGNOSIS — E1165 Type 2 diabetes mellitus with hyperglycemia: Secondary | ICD-10-CM

## 2023-04-03 DIAGNOSIS — I4811 Longstanding persistent atrial fibrillation: Secondary | ICD-10-CM

## 2023-04-03 LAB — POCT GLYCOSYLATED HEMOGLOBIN (HGB A1C): Hemoglobin A1C: 7.2 % — AB (ref 4.0–5.6)

## 2023-04-03 MED ORDER — DILTIAZEM HCL ER COATED BEADS 300 MG PO CP24
300.0000 mg | ORAL_CAPSULE | Freq: Every day | ORAL | 1 refills | Status: DC
Start: 2023-04-03 — End: 2023-07-08

## 2023-04-03 MED ORDER — GLIPIZIDE ER 2.5 MG PO TB24: 2.5000 mg | ORAL_TABLET | Freq: Every day | ORAL | 1 refills | Status: DC

## 2023-04-03 MED ORDER — AMOXICILLIN-POT CLAVULANATE 875-125 MG PO TABS: 1.0000 | ORAL_TABLET | Freq: Two times a day (BID) | ORAL | 0 refills | Status: AC
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
## 2023-04-09 ENCOUNTER — Other Ambulatory Visit: Payer: Self-pay

## 2023-04-09 ENCOUNTER — Encounter: Payer: Self-pay | Admitting: Nurse Practitioner

## 2023-04-09 DIAGNOSIS — J441 Chronic obstructive pulmonary disease with (acute) exacerbation: Secondary | ICD-10-CM

## 2023-04-09 MED ORDER — PREDNISONE 10 MG PO TABS
ORAL_TABLET | ORAL | 0 refills | Status: DC
Start: 2023-04-09 — End: 2023-06-03

## 2023-04-13 ENCOUNTER — Ambulatory Visit: Payer: Medicare Other | Attending: Medical

## 2023-04-13 DIAGNOSIS — L819 Disorder of pigmentation, unspecified: Secondary | ICD-10-CM

## 2023-04-20 ENCOUNTER — Ambulatory Visit: Payer: Medicare Other | Attending: Medical | Admitting: Medical

## 2023-04-20 ENCOUNTER — Encounter: Payer: Self-pay | Admitting: Medical

## 2023-04-20 ENCOUNTER — Other Ambulatory Visit
Admission: RE | Admit: 2023-04-20 | Discharge: 2023-04-20 | Disposition: A | Discharge status: Home / Self Care | Payer: Medicare Other | Source: Ambulatory Visit | Attending: Medical | Admitting: Medical

## 2023-04-20 VITALS — BP 122/71 | HR 94 | Ht 64.0 in | Wt 255.2 lb

## 2023-04-20 DIAGNOSIS — I2510 Atherosclerotic heart disease of native coronary artery without angina pectoris: Secondary | ICD-10-CM | POA: Diagnosis not present

## 2023-04-20 DIAGNOSIS — I4821 Permanent atrial fibrillation: Secondary | ICD-10-CM | POA: Insufficient documentation

## 2023-04-20 DIAGNOSIS — I10 Essential (primary) hypertension: Secondary | ICD-10-CM | POA: Diagnosis not present

## 2023-04-20 DIAGNOSIS — I428 Other cardiomyopathies: Secondary | ICD-10-CM

## 2023-04-20 DIAGNOSIS — E782 Mixed hyperlipidemia: Secondary | ICD-10-CM | POA: Insufficient documentation

## 2023-04-20 DIAGNOSIS — I5020 Unspecified systolic (congestive) heart failure: Secondary | ICD-10-CM

## 2023-04-20 DIAGNOSIS — E785 Hyperlipidemia, unspecified: Secondary | ICD-10-CM | POA: Diagnosis not present

## 2023-04-20 DIAGNOSIS — R0609 Other forms of dyspnea: Secondary | ICD-10-CM | POA: Diagnosis not present

## 2023-04-20 LAB — BASIC METABOLIC PANEL
Anion gap: 9 (ref 5–15)
BUN: 26 mg/dL — ABNORMAL HIGH (ref 8–23)
CO2: 26 mmol/L (ref 22–32)
Calcium: 9 mg/dL (ref 8.9–10.3)
Chloride: 101 mmol/L (ref 98–111)
Creatinine, Ser: 1.27 mg/dL — ABNORMAL HIGH (ref 0.44–1.00)
GFR, Estimated: 45 mL/min — ABNORMAL LOW (ref >60)
Glucose, Bld: 126 mg/dL — ABNORMAL HIGH (ref 70–99)
Potassium: 4.1 mmol/L (ref 3.5–5.1)
Sodium: 136 mmol/L (ref 135–145)

## 2023-04-20 LAB — BRAIN NATRIURETIC PEPTIDE: B Natriuretic Peptide: 201.9 pg/mL — ABNORMAL HIGH (ref 0.0–100.0)

## 2023-04-20 LAB — CBC
HCT: 39.3 % (ref 36.0–46.0)
Hemoglobin: 13.2 g/dL (ref 12.0–15.0)
MCH: 31.9 pg (ref 26.0–34.0)
MCHC: 33.6 g/dL (ref 30.0–36.0)
MCV: 94.9 fL (ref 80.0–100.0)
Platelets: 233 10*3/uL (ref 150–400)
RBC: 4.14 MIL/uL (ref 3.87–5.11)
RDW: 14.6 % (ref 11.5–15.5)
WBC: 8.1 10*3/uL (ref 4.0–10.5)
nRBC: 0 % (ref 0.0–0.2)

## 2023-04-20 MED ORDER — FUROSEMIDE 40 MG PO TABS: 40.0000 mg | ORAL_TABLET | ORAL | 0 refills | Status: DC

## 2023-04-20 NOTE — Patient Instructions (Signed)
Medication Instructions:   Your physician has recommended you make the following change in your medication:   Please take Furosemide 40 mg twice daily for 3 days, Then take Furosemide 40 mg tablet by mouth once daily.  *If you need a refill on your cardiac medications before your next appointment, please call your pharmacy*   Lab Work:  - Please go to the [facility]. You will check in at the front desk to the right as you walk into the atrium. Valet Parking is offered if needed. - No appointment needed. You may go any day between 7 am and 6 pm.  Today- CBC/BMP/BNP  If you have labs (blood work) drawn today and your tests are completely normal, you will receive your results only by: MyChart Message (if you have MyChart) OR A paper copy in the mail If you have any lab test that is abnormal or we need to change your treatment, we will call you to review the results.   Testing/Procedures:  Your physician has requested that you have an echocardiogram. Echocardiography is a painless test that uses sound waves to create images of your heart. It provides your doctor with information about the size and shape of your heart and how well your heart's chambers and valves are working. This procedure takes approximately one hour. There are no restrictions for this procedure. Please do NOT wear cologne, perfume, aftershave, or lotions (deodorant is allowed). Please arrive 15 minutes prior to your appointment time.    Follow-Up: At [facility], you and your health needs are our priority.  As part of our continuing mission to provide you with exceptional heart care, we have created designated Provider Care Teams.  These Care Teams include your primary Cardiologist (physician) and Advanced Practice Providers (APPs -  Physician Assistants and Nurse Practitioners) who all work together to provide you with the care you need, when you need it.  We recommend signing up for the patient  portal called "MyChart".  Sign up information is provided on this After Visit Summary.  MyChart is used to connect with patients for Virtual Visits (Telemedicine).  Patients are able to view lab/test results, encounter notes, upcoming appointments, etc.  Non-urgent messages can be sent to your provider as well.   To learn more about what you can do with MyChart, go to [email or web address].    Your next appointment:   3-4 week(s)  Provider:   You may see Lorine Bears, MD or one of the following Advanced Practice Providers on your designated Care Team:   Nicolasa Ducking, NP Eula Listen, PA-C Cadence Fransico Michael, PA-C Charlsie Quest, NP
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
## 2023-04-23 ENCOUNTER — Ambulatory Visit: Payer: Medicare Other | Admitting: Gastroenterology

## 2023-04-27 ENCOUNTER — Other Ambulatory Visit: Payer: Self-pay | Admitting: Medical

## 2023-04-29 ENCOUNTER — Other Ambulatory Visit: Payer: Self-pay | Admitting: Cardiovascular Disease

## 2023-04-29 DIAGNOSIS — I5022 Chronic systolic (congestive) heart failure: Secondary | ICD-10-CM

## 2023-04-30 ENCOUNTER — Encounter: Payer: Self-pay | Admitting: Nurse Practitioner

## 2023-04-30 MED ORDER — SULFAMETHOXAZOLE-TRIMETHOPRIM 800-160 MG PO TABS: 1.0000 | ORAL_TABLET | Freq: Two times a day (BID) | ORAL | 0 refills | Status: DC

## 2023-04-30 NOTE — Telephone Encounter (Signed)
Pt advised that we send mychart message  and also advised her if worse go to ED

## 2023-05-04 NOTE — Telephone Encounter (Signed)
Lmom to pt to call us back

## 2023-05-05 NOTE — Telephone Encounter (Signed)
Spoke with pt as per alyssa call her cardiology and make sure ok to take with other med they prescribed

## 2023-05-06 MED ORDER — DOXYCYCLINE HYCLATE 100 MG PO TABS: 100.0000 mg | ORAL_TABLET | Freq: Two times a day (BID) | ORAL | 0 refills | Status: AC

## 2023-05-07 ENCOUNTER — Telehealth: Payer: Self-pay | Admitting: Nurse Practitioner

## 2023-05-07 NOTE — Telephone Encounter (Signed)
Per Sarah w/ [facility], referral has been closed due to patient not replying to calls to schedule-Toni 

## 2023-05-12 ENCOUNTER — Encounter: Payer: Self-pay | Admitting: Nurse Practitioner

## 2023-05-12 ENCOUNTER — Telehealth: Payer: Self-pay

## 2023-05-12 NOTE — Telephone Encounter (Signed)
Message sent via Tyrone Apple, Cadence H, PA-C  You21 minutes ago (1:52 PM)    We can send in spiro 12.5mg  daily if the patient needs this. Check BP 2 hour after morning meds. If elevated then call in.

## 2023-05-12 NOTE — Telephone Encounter (Signed)
Faxed med to [facility] for nebulizer med and supplies

## 2023-05-12 NOTE — Telephone Encounter (Signed)
Spoke with pt and made appt

## 2023-05-13 ENCOUNTER — Other Ambulatory Visit: Payer: Self-pay

## 2023-05-13 MED ORDER — SPIRONOLACTONE 25 MG PO TABS: 12.5000 mg | ORAL_TABLET | Freq: Every day | ORAL | 1 refills | Status: DC

## 2023-05-13 MED ORDER — ACCU-CHEK GUIDE VI STRP: ORAL_STRIP | 12 refills | Status: DC

## 2023-05-14 ENCOUNTER — Ambulatory Visit (INDEPENDENT_AMBULATORY_CARE_PROVIDER_SITE_OTHER): Payer: Medicare Other | Admitting: Nurse Practitioner

## 2023-05-14 ENCOUNTER — Encounter: Payer: Self-pay | Admitting: Nurse Practitioner

## 2023-05-14 VITALS — BP 122/60 | HR 82 | Temp 98.1°F | Resp 16 | Ht 64.0 in | Wt 260.4 lb

## 2023-05-14 DIAGNOSIS — I5023 Acute on chronic systolic (congestive) heart failure: Secondary | ICD-10-CM | POA: Diagnosis not present

## 2023-05-14 DIAGNOSIS — E538 Deficiency of other specified B group vitamins: Secondary | ICD-10-CM

## 2023-05-14 MED ORDER — CYANOCOBALAMIN 1000 MCG/ML IJ SOLN
1000.0000 ug | Freq: Once | INTRAMUSCULAR | Status: AC
Start: 2023-05-14 — End: 2023-05-14
  Administered 2023-05-14: 1000 ug via INTRAMUSCULAR

## 2023-05-14 NOTE — Patient Instructions (Addendum)
Take 1 extra 40 mg tablet of furosemide per day x 3 days and notify cardiologist of any increase in weight of 3 lbs in 1 day or 5 lbs in 1 weeks

## 2023-05-14 NOTE — Progress Notes (Signed)
[facility] [address] [place], [place] [address]  Internal MEDICINE  Office Visit Note  Patient Name: Laura [name]  [id number]  [id number]  Date of Service: 05/14/2023  Chief Complaint  Patient presents with   Acute Visit     HPI Shyenne presents for an acute sick visit for swelling of lower extremities and SOB.  SOB at rest and worsens with minimal activity.  Increased swelling of lower extremities esp left leg  Gained 6 lbs since yesterday Concern for CHF exacerbation.      Current Medication:  Outpatient Encounter Medications as of 05/14/2023  Medication Sig Note   Accu-Chek Softclix Lancets lancets Accu-Chek Softclix Lancets  USE AS INSTRUCTED TO CHECK BLOOD SUGARS DAILY 2 HOURS AFTER MEAL    albuterol (VENTOLIN HFA) 108 (90 Base) MCG/ACT inhaler Inhale 2 puffs into the lungs every 4 (four) hours as needed for wheezing or shortness of breath.    ALPRAZolam (XANAX) 0.25 MG tablet Take 1 tablet (0.25 mg total) by mouth 2 (two) times daily as needed for anxiety.    chlorpheniramine-HYDROcodone (TUSSIONEX) 10-8 MG/5ML Take 5 mLs by mouth every 12 (twelve) hours as needed for cough.    diltiazem (CARDIZEM CD) 300 MG 24 hr capsule Take 1 capsule (300 mg total) by mouth daily.    doxycycline (VIBRA-TABS) 100 MG tablet Take 1 tablet (100 mg total) by mouth 2 (two) times daily for 10 days. Take with food    ELIQUIS 5 MG TABS tablet TAKE 1 TABLET BY MOUTH TWICE A DAY    EPINEPHrine 0.3 mg/0.3 mL IJ SOAJ injection Inject 0.3 mg into the muscle as needed for anaphylaxis.    fluticasone (FLONASE) 50 MCG/ACT nasal spray Place into both nostrils daily. 12/24/2022: Patient states she takes as needed   furosemide (LASIX) 40 MG tablet TAKE 1 TABLET BY MOUTH EVERY DAY    glipiZIDE (GLUCOTROL XL) 2.5 MG 24 hr tablet Take 1 tablet (2.5 mg total) by mouth daily with breakfast.    glucose blood (ACCU-CHEK GUIDE) test strip Use as instructed to check blood sugars daily 2 hours  after meal DX E11.65    ipratropium-albuterol (DUONEB) 0.5-2.5 (3) MG/3ML SOLN USE 1 VIAL IN NEBULIZER EVERY 6 HOURS - and as needed    loperamide (IMODIUM) 2 MG capsule Take 4 mg by mouth as needed for diarrhea or loose stools.    methocarbamol (ROBAXIN) 500 MG tablet Take 1 tablet by mouth 3 (three) times daily as needed.    montelukast (SINGULAIR) 10 MG tablet TAKE 1 TABLET BY MOUTH EVERY DAY    omeprazole (PRILOSEC) 40 MG capsule TAKE 1 CAPSULE (40 MG TOTAL) BY MOUTH DAILY.    ondansetron (ZOFRAN) 4 MG tablet Take 1 tablet (4 mg total) by mouth every 8 (eight) hours as needed for nausea or vomiting.    predniSONE (DELTASONE) 10 MG tablet Take one tab 3 x day for 3 days, then take one tab 2 x a day for 3 days and then take one tab a day for 3 days for copd    Probiotic Product (PROBIOTIC-10) CAPS Take 1 capsule by mouth daily.    promethazine (PHENERGAN) 12.5 MG tablet Take 1 tablet (12.5 mg total) by mouth every 12 (twelve) hours as needed for nausea or vomiting.    rosuvastatin (CRESTOR) 20 MG tablet TAKE 1 TABLET BY MOUTH EVERY DAY    sacubitril-valsartan (ENTRESTO) 24-26 MG TAKE 1 TABLET BY MOUTH TWICE A DAY    spironolactone (ALDACTONE) 25 MG  tablet Take 0.5 tablets (12.5 mg total) by mouth daily.    THEO-24 100 MG 24 hr capsule Take 1 capsule (100 mg total) by mouth daily.    theophylline (THEODUR) 100 MG 12 hr tablet Take 1 tablet (100 mg total) by mouth 2 (two) times daily.    traMADol (ULTRAM) 50 MG tablet Take 50 mg by mouth 4 (four) times daily as needed.    [EXPIRED] cyanocobalamin (VITAMIN B12) injection 1,000 mcg     No facility-administered encounter medications on file as of 05/14/2023.      Medical History: Past Medical History:  Diagnosis Date   Asthma    Chronic atrial fibrillation (HCC)    a. diagnosed in 09/2016; b. failed flecainide and propafenone due to LE swelling and SOB, could not afford Multaq; c. CHADS2VASc => 5 (CHF, HTN, age x 1, nonobs CAD, female)-->  Eliquis   COPD (chronic obstructive pulmonary disease) (HCC)    GERD (gastroesophageal reflux disease)    HFmrEF (heart failure with mid-range ejection fraction) (HCC)    a. 12/2019 Echo: EF 40-45%. b. 01/2022 Echo: EF 40-45%.   Hyperlipidemia    Hypertension    NICM (nonischemic cardiomyopathy) (HCC)    a. 12/2019 Echo: EF 40-45%, glob HK, mildly reduced RV fxn, sev dil LA. *HR 130 (afib) during study.   Nonobstructive CAD (coronary artery disease)    a. Lexiscan Myoview 10/2016: no evidence of ischemia, EF 53%; b. 02/2020 Cath: LM nl, LAD 20p, 59m, LCX 20ost, OM1/2/3 nl, LPDA nl, LPL1/2 nl, LPAV nl, RCA small, nl.   Obesity    Obstructive sleep apnea    Pulmonary hypertension (HCC)    Sleep apnea    Stroke (HCC)    Systolic dysfunction    a. TTE 10/2016: EF 50%, mild LVH, moderately dilated LA, moderate MR/TR, mild pulmonary hypertension     Vital Signs: BP 122/60   Pulse 82   Temp 98.1 F (36.7 C)   Resp 16   Ht 5\' 4"  (1.626 m)   Wt 260 lb 6.4 oz (118.1 kg)   LMP  (LMP Unknown)   SpO2 97% Comment: 3L  BMI 44.70 kg/m    Review of Systems  Constitutional:  Positive for activity change, fatigue and unexpected weight change (gained 6 lbs since yesterday). Negative for chills.  HENT:  Negative for congestion, postnasal drip, rhinorrhea, sneezing and sore throat.   Eyes:  Negative for redness.  Respiratory:  Positive for chest tightness, shortness of breath and wheezing. Negative for cough.   Cardiovascular:  Positive for leg swelling. Negative for chest pain and palpitations.  Gastrointestinal: Negative.  Negative for abdominal pain, constipation, diarrhea, nausea and vomiting.  Genitourinary:  Negative for dysuria and frequency.  Musculoskeletal:  Positive for arthralgias. Negative for back pain, joint swelling and neck pain.  Skin:  Negative for rash.  Neurological:  Positive for weakness. Negative for tremors and numbness.  Hematological:  Negative for adenopathy. Does not  bruise/bleed easily.  Psychiatric/Behavioral:  Negative for behavioral problems (Depression), sleep disturbance and suicidal ideas. The patient is not nervous/anxious.     Physical Exam Vitals reviewed.  Constitutional:      General: She is not in acute distress.    Appearance: Normal appearance. She is obese. She is ill-appearing.  HENT:     Head: Normocephalic and atraumatic.  Cardiovascular:     Rate and Rhythm: Normal rate and regular rhythm.     Heart sounds: Normal heart sounds.  Pulmonary:  Effort: Accessory muscle usage and respiratory distress (mild) present.     Breath sounds: Examination of the right-middle field reveals rales. Examination of the left-middle field reveals rales. Examination of the right-lower field reveals rales. Examination of the left-lower field reveals rales. Rales present.  Neurological:     Mental Status: She is alert.       Assessment/Plan: 1. Acute on chronic systolic congestive heart failure (HCC) Instructed patient to take an extra 40 mg furosemide tablet daily for the next 3 days and to call her cardiologist as well about the fluid retention   2. B12 deficiency B12 administered today in office  - cyanocobalamin (VITAMIN B12) injection 1,000 mcg   General Counseling: Ayumi verbalizes understanding of the findings of todays visit and agrees with plan of treatment. I have discussed any further diagnostic evaluation that may be needed or ordered today. We also reviewed her medications today. she has been encouraged to call the office with any questions or concerns that should arise related to todays visit.    Counseling:    No orders of the defined types were placed in this encounter.   Meds ordered this encounter  Medications   cyanocobalamin (VITAMIN B12) injection 1,000 mcg    Return for previously scheduled, AWV, Genora Arp PCP in august and otherwise as needed .  [place] Controlled Substance Database was reviewed by me for  overdose risk score (ORS)  Time spent:20 Minutes Time spent with patient included reviewing progress notes, labs, imaging studies, and discussing plan for follow up.   This patient was seen by Sallyanne Kuster, FNP-C in collaboration with Dr. Beverely Risen as a part of collaborative care agreement.  Sentoria Brent R. Tedd Sias, MSN, FNP-C Internal Medicine

## 2023-05-15 ENCOUNTER — Encounter: Payer: Self-pay | Admitting: Nurse Practitioner

## 2023-05-15 DIAGNOSIS — I5023 Acute on chronic systolic (congestive) heart failure: Secondary | ICD-10-CM | POA: Insufficient documentation

## 2023-05-20 ENCOUNTER — Encounter: Payer: Self-pay | Admitting: Nurse Practitioner

## 2023-05-20 ENCOUNTER — Ambulatory Visit: Payer: Medicare Other | Attending: Medical

## 2023-05-20 DIAGNOSIS — R0609 Other forms of dyspnea: Secondary | ICD-10-CM | POA: Diagnosis not present

## 2023-05-20 DIAGNOSIS — I5020 Unspecified systolic (congestive) heart failure: Secondary | ICD-10-CM | POA: Diagnosis not present

## 2023-05-20 LAB — ECHOCARDIOGRAM COMPLETE
AR max vel: 2.51 cm2
AV Area VTI: 2.27 cm2
AV Area mean vel: 2.51 cm2
AV Mean grad: 6 mmHg
AV Peak grad: 10 mmHg
Ao pk vel: 1.58 m/s
Area-P 1/2: 4.8 cm2
Calc EF: 43.9 %
S' Lateral: 4.1 cm
Single Plane A2C EF: 50.5 %
Single Plane A4C EF: 37.5 %

## 2023-05-21 ENCOUNTER — Encounter: Payer: Self-pay | Admitting: Nurse Practitioner

## 2023-05-21 DIAGNOSIS — R0602 Shortness of breath: Secondary | ICD-10-CM

## 2023-05-21 DIAGNOSIS — Z9981 Dependence on supplemental oxygen: Secondary | ICD-10-CM

## 2023-05-21 DIAGNOSIS — J9611 Chronic respiratory failure with hypoxia: Secondary | ICD-10-CM

## 2023-05-21 DIAGNOSIS — J441 Chronic obstructive pulmonary disease with (acute) exacerbation: Secondary | ICD-10-CM

## 2023-05-21 DIAGNOSIS — J449 Chronic obstructive pulmonary disease, unspecified: Secondary | ICD-10-CM

## 2023-05-21 MED ORDER — LEVOFLOXACIN 500 MG PO TABS
500.0000 mg | ORAL_TABLET | Freq: Every day | ORAL | 0 refills | Status: AC
Start: 2023-05-21 — End: 2023-05-28

## 2023-05-26 ENCOUNTER — Ambulatory Visit: Payer: Medicare Other | Attending: Cardiovascular Disease | Admitting: Cardiovascular Disease

## 2023-05-26 ENCOUNTER — Encounter: Payer: Self-pay | Admitting: Cardiovascular Disease

## 2023-05-26 VITALS — BP 116/75 | HR 89 | Ht 64.0 in | Wt 258.0 lb

## 2023-05-26 DIAGNOSIS — G4730 Sleep apnea, unspecified: Secondary | ICD-10-CM | POA: Insufficient documentation

## 2023-05-26 DIAGNOSIS — I10 Essential (primary) hypertension: Secondary | ICD-10-CM | POA: Diagnosis not present

## 2023-05-26 DIAGNOSIS — I482 Chronic atrial fibrillation, unspecified: Secondary | ICD-10-CM | POA: Diagnosis not present

## 2023-05-26 DIAGNOSIS — E785 Hyperlipidemia, unspecified: Secondary | ICD-10-CM | POA: Insufficient documentation

## 2023-05-26 DIAGNOSIS — I5022 Chronic systolic (congestive) heart failure: Secondary | ICD-10-CM | POA: Diagnosis not present

## 2023-05-26 DIAGNOSIS — I2510 Atherosclerotic heart disease of native coronary artery without angina pectoris: Secondary | ICD-10-CM | POA: Insufficient documentation

## 2023-05-26 DIAGNOSIS — Z8673 Personal history of transient ischemic attack (TIA), and cerebral infarction without residual deficits: Secondary | ICD-10-CM | POA: Insufficient documentation

## 2023-05-26 MED ORDER — SPIRONOLACTONE 25 MG PO TABS: 25.0000 mg | ORAL_TABLET | Freq: Every day | ORAL | 3 refills | Status: DC

## 2023-05-26 NOTE — Patient Instructions (Signed)
Medication Instructions:  INCREASE the Spironolactone to 25 mg once daily  *If you need a refill on your cardiac medications before your next appointment, please call your pharmacy*   Lab Work: None ordered If you have labs (blood work) drawn today and your tests are completely normal, you will receive your results only by: MyChart Message (if you have MyChart) OR A paper copy in the mail If you have any lab test that is abnormal or we need to change your treatment, we will call you to review the results.   Testing/Procedures: None ordered   Follow-Up: At [facility], you and your health needs are our priority.  As part of our continuing mission to provide you with exceptional heart care, we have created designated Provider Care Teams.  These Care Teams include your primary Cardiologist (physician) and Advanced Practice Providers (APPs -  Physician Assistants and Nurse Practitioners) who all work together to provide you with the care you need, when you need it.  We recommend signing up for the patient portal called "MyChart".  Sign up information is provided on this After Visit Summary.  MyChart is used to connect with patients for Virtual Visits (Telemedicine).  Patients are able to view lab/test results, encounter notes, upcoming appointments, etc.  Non-urgent messages can be sent to your provider as well.   To learn more about what you can do with MyChart, go to [email or web address].    Your next appointment:   6 month(s)  Provider:   You may see Lorine Bears, MD or one of the following Advanced Practice Providers on your designated Care Team:   Nicolasa Ducking, NP Eula Listen, PA-C Cadence Fransico Michael, PA-C Charlsie Quest, NP    Other Instructions A referral has been placed to the Heart Failure Clinic

## 2023-05-26 NOTE — Progress Notes (Signed)
Cardiology Office Note   Date:  05/26/2023   ID:  [name], [name] 1951/09/15, MRN [id number]  PCP:  Sallyanne Kuster, NP  Cardiologist:   Lorine Bears, MD   Chief Complaint  Patient presents with   Follow-up    3-4 week follow up post echo. Patient is experiencing short of breath. Meds reviewed.        History of Present Illness: Laura [name] is a 72 y.o. female who is here today for a follow-up visit regarding chronic atrial fibrillation and chronic systolic heart failure.  The patient has chronic medical conditions that include obesity, sleep apnea on CPAP, asthma, chronic systolic heart failure due to nonischemic cardiomyopathy, nonobstructive CAD, CVA and essential hypertension. She was diagnosed with atrial fibrillation in October 2017.She did not tolerate multiple antiarrhythmic medications and has been treated with rate control. She was hospitalized in September of 2020 with ischemic colitis.  Angiogram showed no acute obstructive disease.  She underwent ileum and right colon resection.    She did not tolerate metoprolol due to increased dyspnea.  She underwent a right and left cardiac catheterization in March 2021 to evaluate pulmonary hypertension and exertional dyspnea.  It showed mild nonobstructive coronary artery disease.  Right heart catheterization showed moderately elevated filling pressures with mild pulmonary hypertension and normal cardiac output.  Her pulmonary wedge pressure at that time was 24 mmHg.    She was hospitalized in November of 2021 with acute right MCA stroke in the setting of being off anticoagulation for lumbar injection.  Echocardiogram during that admission showed an EF of 45 to 50%.  Most recent echocardiogram last month showed an EF of 40 to 45% with mild to moderate mitral regurgitation. She struggles with bilateral leg edema worse on the left side due to suspected lymphedema.   She did not tolerate beta-blockers due to increased  shortness of breath. She had issues with dehydration that required using furosemide as needed.  She was most recently seen in our office in April and was noted to have volume overload.  Lasix was increased from 20 to 40 mg daily. She reports continued shortness of breath and she is very frustrated with her symptoms.  She is now on oxygen.  She has chronic lung disease did not smoke in the past.  She was told that it was due to occupational exposure.   Past Medical History:  Diagnosis Date   Asthma    Chronic atrial fibrillation (HCC)    a. diagnosed in 09/2016; b. failed flecainide and propafenone due to LE swelling and SOB, could not afford Multaq; c. CHADS2VASc => 5 (CHF, HTN, age x 1, nonobs CAD, female)--> Eliquis   COPD (chronic obstructive pulmonary disease) (HCC)    GERD (gastroesophageal reflux disease)    HFmrEF (heart failure with mid-range ejection fraction) (HCC)    a. 12/2019 Echo: EF 40-45%. b. 01/2022 Echo: EF 40-45%.   Hyperlipidemia    Hypertension    NICM (nonischemic cardiomyopathy) (HCC)    a. 12/2019 Echo: EF 40-45%, glob HK, mildly reduced RV fxn, sev dil LA. *HR 130 (afib) during study.   Nonobstructive CAD (coronary artery disease)    a. Lexiscan Myoview 10/2016: no evidence of ischemia, EF 53%; b. 02/2020 Cath: LM nl, LAD 20p, 37m, LCX 20ost, OM1/2/3 nl, LPDA nl, LPL1/2 nl, LPAV nl, RCA small, nl.   Obesity    Obstructive sleep apnea    Pulmonary hypertension (HCC)    Sleep apnea  Stroke [facility])    Systolic dysfunction    a. TTE 10/2016: EF 50%, mild LVH, moderately dilated LA, moderate MR/TR, mild pulmonary hypertension    Past Surgical History:  Procedure Laterality Date   BOWEL RESECTION  09/11/2019   Procedure: SMALL BOWEL RESECTION;  Surgeon: Carolan Shiver, MD;  Location: [facility] ORS;  Service: General;;   CARDIAC CATHETERIZATION     cataract surgery     COLONOSCOPY WITH PROPOFOL N/A 03/19/2020   Procedure: COLONOSCOPY WITH PROPOFOL;  Surgeon: Wyline Mood, MD;  Location: [facility] ENDOSCOPY;  Service: Gastroenterology;  Laterality: N/A;   CORONARY ANGIOPLASTY     INCISION AND DRAINAGE ABSCESS Right 06/29/2016   Procedure: INCISION AND DRAINAGE ABSCESS;  Surgeon: Lattie Haw, MD;  Location: [facility] ORS;  Service: General;  Laterality: Right;   INCISION AND DRAINAGE OF WOUND Left 06/29/2016   Procedure: IRRIGATION AND DEBRIDEMENT WOUND;  Surgeon: Lattie Haw, MD;  Location: [facility] ORS;  Service: General;  Laterality: Left;   IR ANGIO VERTEBRAL SEL SUBCLAVIAN INNOMINATE UNI R MOD SED  11/20/2020   IR CT HEAD LTD  11/20/2020   IR PERCUTANEOUS ART THROMBECTOMY/INFUSION INTRACRANIAL INC DIAG ANGIO  11/20/2020   LAPAROSCOPIC RIGHT COLECTOMY  09/11/2019   Procedure: RIGHT COLECTOMY;  Surgeon: Carolan Shiver, MD;  Location: [facility] ORS;  Service: General;;   LAPAROSCOPY N/A 09/11/2019   Procedure: LAPAROSCOPY DIAGNOSTIC;  Surgeon: Carolan Shiver, MD;  Location: [facility] ORS;  Service: General;  Laterality: N/A;   LAPAROTOMY N/A 09/13/2019   Procedure: REOPENING OF RECENT LAPAROTOMYANASTOMOSIS OF BOWEL;  Surgeon: Carolan Shiver, MD;  Location: [facility] ORS;  Service: General;  Laterality: N/A;   RADIOLOGY WITH ANESTHESIA N/A 11/20/2020   Procedure: IR WITH ANESTHESIA - CODE STROKE;  Surgeon: Radiologist, Medication, MD;  Location: [facility] OR;  Service: Radiology;  Laterality: N/A;   RIGHT/LEFT HEART CATH AND CORONARY ANGIOGRAPHY Bilateral 02/27/2020   Procedure: RIGHT/LEFT HEART CATH AND CORONARY ANGIOGRAPHY;  Surgeon: Iran Ouch, MD;  Location: [facility] INVASIVE CV LAB;  Service: Cardiovascular;  Laterality: Bilateral;   VISCERAL ANGIOGRAPHY N/A 09/12/2019   Procedure: VISCERAL ANGIOGRAPHY;  Surgeon: Annice Needy, MD;  Location: [facility] INVASIVE CV LAB;  Service: Cardiovascular;  Laterality: N/A;     Current Outpatient Medications  Medication Sig Dispense Refill   Accu-Chek Softclix Lancets lancets Accu-Chek Softclix Lancets  USE AS INSTRUCTED TO  CHECK BLOOD SUGARS DAILY 2 HOURS AFTER MEAL     albuterol (VENTOLIN HFA) 108 (90 Base) MCG/ACT inhaler Inhale 2 puffs into the lungs every 4 (four) hours as needed for wheezing or shortness of breath. 18 g 3   ALPRAZolam (XANAX) 0.25 MG tablet Take 1 tablet (0.25 mg total) by mouth 2 (two) times daily as needed for anxiety. 30 tablet 2   diltiazem (CARDIZEM CD) 300 MG 24 hr capsule Take 1 capsule (300 mg total) by mouth daily. 90 capsule 1   ELIQUIS 5 MG TABS tablet TAKE 1 TABLET BY MOUTH TWICE A DAY 60 tablet 5   EPINEPHrine 0.3 mg/0.3 mL IJ SOAJ injection Inject 0.3 mg into the muscle as needed for anaphylaxis.     furosemide (LASIX) 40 MG tablet TAKE 1 TABLET BY MOUTH EVERY DAY 90 tablet 1   glipiZIDE (GLUCOTROL XL) 2.5 MG 24 hr tablet Take 1 tablet (2.5 mg total) by mouth daily with breakfast. 90 tablet 1   glucose blood (ACCU-CHEK GUIDE) test strip Use as instructed to check blood sugars daily 2 hours after meal DX E11.65 100 each 12  ipratropium-albuterol (DUONEB) 0.5-2.5 (3) MG/3ML SOLN USE 1 VIAL IN NEBULIZER EVERY 6 HOURS - and as needed 120 mL 11   levofloxacin (LEVAQUIN) 500 MG tablet Take 1 tablet (500 mg total) by mouth daily for 7 days. Take with food 7 tablet 0   loperamide (IMODIUM) 2 MG capsule Take 4 mg by mouth as needed for diarrhea or loose stools.     methocarbamol (ROBAXIN) 500 MG tablet Take 1 tablet by mouth 3 (three) times daily as needed.     montelukast (SINGULAIR) 10 MG tablet TAKE 1 TABLET BY MOUTH EVERY DAY 90 tablet 1   omeprazole (PRILOSEC) 40 MG capsule TAKE 1 CAPSULE (40 MG TOTAL) BY MOUTH DAILY. 90 capsule 3   ondansetron (ZOFRAN) 4 MG tablet Take 1 tablet (4 mg total) by mouth every 8 (eight) hours as needed for nausea or vomiting. 60 tablet 1   predniSONE (DELTASONE) 10 MG tablet Take one tab 3 x day for 3 days, then take one tab 2 x a day for 3 days and then take one tab a day for 3 days for copd (Patient taking differently: Take 10 mg by mouth daily. Take one  tab 3 x day for 3 days, then take one tab 2 x a day for 3 days and then take one tab a day for 3 days for copd) 18 tablet 0   Probiotic Product (PROBIOTIC-10) CAPS Take 1 capsule by mouth daily.     promethazine (PHENERGAN) 12.5 MG tablet Take 1 tablet (12.5 mg total) by mouth every 12 (twelve) hours as needed for nausea or vomiting. 30 tablet 2   rosuvastatin (CRESTOR) 20 MG tablet TAKE 1 TABLET BY MOUTH EVERY DAY 90 tablet 2   sacubitril-valsartan (ENTRESTO) 24-26 MG TAKE 1 TABLET BY MOUTH TWICE A DAY 60 tablet 0   THEO-24 100 MG 24 hr capsule Take 1 capsule (100 mg total) by mouth daily. 30 capsule 5   traMADol (ULTRAM) 50 MG tablet Take 50 mg by mouth 4 (four) times daily as needed.     chlorpheniramine-HYDROcodone (TUSSIONEX) 10-8 MG/5ML Take 5 mLs by mouth every 12 (twelve) hours as needed for cough. (Patient not taking: Reported on 05/26/2023) 140 mL 0   fluticasone (FLONASE) 50 MCG/ACT nasal spray Place into both nostrils daily. (Patient not taking: Reported on 05/26/2023)     spironolactone (ALDACTONE) 25 MG tablet Take 1 tablet (25 mg total) by mouth daily. 90 tablet 3   theophylline (THEODUR) 100 MG 12 hr tablet Take 1 tablet (100 mg total) by mouth 2 (two) times daily. 60 tablet 5   No current facility-administered medications for this visit.    Allergies:   Flecainide, Metoprolol, Propafenone, and Rivaroxaban    Social History:  The patient  reports that she has never smoked. She has never used smokeless tobacco. She reports that she does not drink alcohol and does not use drugs.   Family History:  The patient's family history includes COPD in her father; Cancer in her daughter; Dementia in her mother; Heart disease in her brother; Osteoporosis in her mother; Vascular Disease in her mother.    ROS:  Please see the history of present illness.   Otherwise, review of systems are positive for none.   All other systems are reviewed and negative.    PHYSICAL EXAM: VS:  BP 116/75 (BP  Location: Right Arm, Patient Position: Sitting, Cuff Size: Large)   Pulse 89   Ht 5\' 4"  (1.626 m)   Wt 258 lb (  117 kg)   LMP  (LMP Unknown)   SpO2 98%   BMI 44.29 kg/m  , BMI Body mass index is 44.29 kg/m. GEN: Well nourished, well developed, in no acute distress  HEENT: normal  Neck: no JVD, carotid bruits, or masses Cardiac: Irregularly irregular ; no murmurs, rubs, or gallops, mild/moderate bilateral leg edema worse on the left side Respiratory:  clear to auscultation bilaterally, normal work of breathing GI: soft, nontender, nondistended, + BS MS: no deformity or atrophy  Skin: warm and dry, no rash Neuro:  Strength and sensation are intact Psych: euthymic mood, full affect Radial pulses normal   EKG:  EKG is ordered today. The ekg ordered today demonstrates atrial fibrillation with ventricular rate of 89 bpm.  Low voltage and poor R wave progression in the anterior leads.  Recent Labs: 10/31/2022: ALT 13; Magnesium 2.1 04/20/2023: B Natriuretic Peptide 201.9; BUN 26; Creatinine, Ser 1.27; Hemoglobin 13.2; Platelets 233; Potassium 4.1; Sodium 136    Lipid Panel    Component Value Date/Time   CHOL 143 06/12/2021 1028   TRIG 140 06/12/2021 1028   HDL 73 06/12/2021 1028   CHOLHDL 2.0 06/12/2021 1028   CHOLHDL 3.3 11/21/2020 0616   VLDL 25 11/21/2020 0616   LDLCALC 47 06/12/2021 1028      Wt Readings from Last 3 Encounters:  05/26/23 258 lb (117 kg)  05/14/23 260 lb 6.4 oz (118.1 kg)  04/20/23 255 lb 3.2 oz (115.8 kg)         04/03/2017   11:07 AM  PAD Screen  Previous PAD dx? No  Previous surgical procedure? No  Pain with walking? No  Feet/toe relief with dangling? No  Painful, non-healing ulcers? No  Extremities discolored? No      ASSESSMENT AND PLAN:  1.  Nonobstructive coronary artery disease: No chest pain.  Continue medical therapy.  2.  Chronic atrial fibrillation: Ventricular rate is reasonably controlled with diltiazem.  She did not tolerate  beta-blockers including metoprolol or bisoprolol due to worsening shortness of breath.    Continue anticoagulation with Eliquis.  3. Sleep apnea: Continue treatment with CPAP.  4. Essential hypertension: Blood pressure controlled on current medications.  5.  Chronic systolic heart failure: Most recent echocardiogram showed an EF of 40 to 45%.  Continue Entresto .  I increased spironolactone to 25 mg once daily.  She continues to struggle with shortness of breath.  She has very narrow euvolemic window and she might benefit from the CardioMEMS device.  I am referring her to the advanced heart failure clinic. She will also require an SGLT2 inhibitor.  6.  History of CVA: Embolic in the setting of being off Eliquis.  Any future interruption of Eliquis will require bridging with low molecular weight heparin.  7.  Hyperlipidemia: Most recent lipid profile showed an LDL of 47.  Continue rosuvastatin 20 mg daily.      Disposition: Established with advanced heart failure clinic and follow-up with me in 6 months.  Signed,  Lorine Bears, MD  05/26/2023 5:10 PM    [facility]

## 2023-05-27 ENCOUNTER — Encounter: Payer: Self-pay | Admitting: Nurse Practitioner

## 2023-05-28 ENCOUNTER — Telehealth: Payer: Self-pay | Admitting: Nurse Practitioner

## 2023-05-28 NOTE — Telephone Encounter (Signed)
Lvm notifying patient urgent referral has been sent to Dr. Karna Christmas as urgent and to see if she wants to see dsk on Monday until she sees Dr. Carnella Guadalajara

## 2023-05-29 ENCOUNTER — Encounter: Payer: Self-pay | Admitting: Nurse Practitioner

## 2023-06-01 ENCOUNTER — Other Ambulatory Visit: Payer: Self-pay | Admitting: Cardiovascular Disease

## 2023-06-01 DIAGNOSIS — I5022 Chronic systolic (congestive) heart failure: Secondary | ICD-10-CM

## 2023-06-02 ENCOUNTER — Other Ambulatory Visit: Payer: Self-pay | Admitting: Nurse Practitioner

## 2023-06-02 ENCOUNTER — Encounter (INDEPENDENT_AMBULATORY_CARE_PROVIDER_SITE_OTHER): Payer: Self-pay | Admitting: Vascular Surgery

## 2023-06-02 ENCOUNTER — Encounter: Payer: Self-pay | Admitting: Nurse Practitioner

## 2023-06-02 DIAGNOSIS — J441 Chronic obstructive pulmonary disease with (acute) exacerbation: Secondary | ICD-10-CM

## 2023-06-02 DIAGNOSIS — R0602 Shortness of breath: Secondary | ICD-10-CM | POA: Diagnosis not present

## 2023-06-02 DIAGNOSIS — J4489 Other specified chronic obstructive pulmonary disease: Secondary | ICD-10-CM | POA: Diagnosis not present

## 2023-06-02 DIAGNOSIS — J449 Chronic obstructive pulmonary disease, unspecified: Secondary | ICD-10-CM | POA: Diagnosis not present

## 2023-06-02 NOTE — Telephone Encounter (Signed)
Please review

## 2023-06-03 ENCOUNTER — Telehealth: Payer: Self-pay | Admitting: Nurse Practitioner

## 2023-06-03 DIAGNOSIS — J449 Chronic obstructive pulmonary disease, unspecified: Secondary | ICD-10-CM | POA: Diagnosis not present

## 2023-06-03 MED ORDER — TIRZEPATIDE 5 MG/0.5ML ~~LOC~~ SOAJ: 5.0000 mg | SUBCUTANEOUS | 5 refills | Status: DC

## 2023-06-03 MED ORDER — PREDNISONE 10 MG PO TABS
ORAL_TABLET | ORAL | 0 refills | Status: DC
Start: 2023-06-03 — End: 2023-06-05

## 2023-06-03 NOTE — Telephone Encounter (Signed)
Pulmonary appointment 06/02/2023 @ [facility]

## 2023-06-03 NOTE — Telephone Encounter (Signed)
Left a detailed message for the patient to reach out to the company to check the status for lymphedema pump.

## 2023-06-05 MED ORDER — PREDNISONE 10 MG PO TABS
10.0000 mg | ORAL_TABLET | Freq: Every day | ORAL | 1 refills | Status: DC
Start: 2023-06-05 — End: 2023-09-17

## 2023-06-05 NOTE — Addendum Note (Signed)
Addended by: Sallyanne Kuster on: 06/05/2023 01:08 PM   Modules accepted: Orders

## 2023-06-09 ENCOUNTER — Encounter: Payer: Self-pay | Admitting: Nurse Practitioner

## 2023-06-09 ENCOUNTER — Telehealth: Payer: Self-pay

## 2023-06-09 ENCOUNTER — Other Ambulatory Visit: Payer: Self-pay

## 2023-06-09 MED ORDER — ACCU-CHEK GUIDE VI STRP: ORAL_STRIP | 12 refills | Status: AC

## 2023-06-09 NOTE — Telephone Encounter (Signed)
Lmom that she received her test strip

## 2023-06-14 ENCOUNTER — Encounter: Payer: Self-pay | Admitting: Nurse Practitioner

## 2023-06-15 MED ORDER — PROMETHAZINE HCL 25 MG PO TABS: 25.0000 mg | ORAL_TABLET | Freq: Three times a day (TID) | ORAL | 0 refills | Status: DC | PRN

## 2023-06-15 MED ORDER — TIRZEPATIDE 2.5 MG/0.5ML ~~LOC~~ SOAJ: 2.5000 mg | SUBCUTANEOUS | 5 refills | Status: DC

## 2023-06-16 DIAGNOSIS — R531 Weakness: Secondary | ICD-10-CM | POA: Diagnosis not present

## 2023-06-16 DIAGNOSIS — N183 Chronic kidney disease, stage 3 unspecified: Secondary | ICD-10-CM | POA: Diagnosis not present

## 2023-06-16 DIAGNOSIS — R9431 Abnormal electrocardiogram [ECG] [EKG]: Secondary | ICD-10-CM | POA: Diagnosis not present

## 2023-06-16 DIAGNOSIS — T50905A Adverse effect of unspecified drugs, medicaments and biological substances, initial encounter: Secondary | ICD-10-CM | POA: Diagnosis not present

## 2023-06-16 DIAGNOSIS — K219 Gastro-esophageal reflux disease without esophagitis: Secondary | ICD-10-CM | POA: Diagnosis not present

## 2023-06-16 DIAGNOSIS — M549 Dorsalgia, unspecified: Secondary | ICD-10-CM | POA: Diagnosis not present

## 2023-06-16 DIAGNOSIS — R072 Precordial pain: Secondary | ICD-10-CM | POA: Diagnosis not present

## 2023-06-16 DIAGNOSIS — R079 Chest pain, unspecified: Secondary | ICD-10-CM | POA: Diagnosis not present

## 2023-06-16 DIAGNOSIS — J449 Chronic obstructive pulmonary disease, unspecified: Secondary | ICD-10-CM | POA: Diagnosis not present

## 2023-06-16 DIAGNOSIS — T383X5A Adverse effect of insulin and oral hypoglycemic [antidiabetic] drugs, initial encounter: Secondary | ICD-10-CM | POA: Diagnosis not present

## 2023-06-16 DIAGNOSIS — E1122 Type 2 diabetes mellitus with diabetic chronic kidney disease: Secondary | ICD-10-CM | POA: Diagnosis not present

## 2023-06-16 DIAGNOSIS — R110 Nausea: Secondary | ICD-10-CM | POA: Diagnosis not present

## 2023-06-16 DIAGNOSIS — I4891 Unspecified atrial fibrillation: Secondary | ICD-10-CM | POA: Diagnosis not present

## 2023-06-16 DIAGNOSIS — Z7901 Long term (current) use of anticoagulants: Secondary | ICD-10-CM | POA: Diagnosis not present

## 2023-06-16 DIAGNOSIS — I700 Atherosclerosis of aorta: Secondary | ICD-10-CM | POA: Diagnosis not present

## 2023-06-16 DIAGNOSIS — I130 Hypertensive heart and chronic kidney disease with heart failure and stage 1 through stage 4 chronic kidney disease, or unspecified chronic kidney disease: Secondary | ICD-10-CM | POA: Diagnosis not present

## 2023-06-16 DIAGNOSIS — I2510 Atherosclerotic heart disease of native coronary artery without angina pectoris: Secondary | ICD-10-CM | POA: Diagnosis not present

## 2023-06-16 DIAGNOSIS — I5022 Chronic systolic (congestive) heart failure: Secondary | ICD-10-CM | POA: Diagnosis not present

## 2023-06-16 DIAGNOSIS — G4733 Obstructive sleep apnea (adult) (pediatric): Secondary | ICD-10-CM | POA: Diagnosis not present

## 2023-06-16 DIAGNOSIS — R0789 Other chest pain: Secondary | ICD-10-CM | POA: Diagnosis not present

## 2023-06-18 ENCOUNTER — Encounter: Payer: Self-pay | Admitting: Nurse Practitioner

## 2023-06-18 ENCOUNTER — Telehealth (INDEPENDENT_AMBULATORY_CARE_PROVIDER_SITE_OTHER): Payer: Medicare Other | Admitting: Nurse Practitioner

## 2023-06-18 VITALS — BP 115/80 | HR 112 | Resp 16 | Ht 64.0 in | Wt 245.0 lb

## 2023-06-18 DIAGNOSIS — R630 Anorexia: Secondary | ICD-10-CM

## 2023-06-18 DIAGNOSIS — T383X5D Adverse effect of insulin and oral hypoglycemic [antidiabetic] drugs, subsequent encounter: Secondary | ICD-10-CM

## 2023-06-18 DIAGNOSIS — R110 Nausea: Secondary | ICD-10-CM | POA: Diagnosis not present
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
## 2023-06-30 ENCOUNTER — Ambulatory Visit: Payer: Medicare Other | Attending: Family | Admitting: Family

## 2023-06-30 ENCOUNTER — Encounter: Payer: Self-pay | Admitting: Family

## 2023-06-30 ENCOUNTER — Other Ambulatory Visit: Payer: Self-pay | Admitting: Cardiovascular Disease

## 2023-06-30 VITALS — BP 111/64 | HR 85 | Wt 254.4 lb

## 2023-06-30 DIAGNOSIS — Z5986 Financial insecurity: Secondary | ICD-10-CM | POA: Insufficient documentation

## 2023-06-30 DIAGNOSIS — Z8673 Personal history of transient ischemic attack (TIA), and cerebral infarction without residual deficits: Secondary | ICD-10-CM | POA: Diagnosis not present

## 2023-06-30 DIAGNOSIS — Z7984 Long term (current) use of oral hypoglycemic drugs: Secondary | ICD-10-CM | POA: Diagnosis not present

## 2023-06-30 DIAGNOSIS — K219 Gastro-esophageal reflux disease without esophagitis: Secondary | ICD-10-CM | POA: Insufficient documentation

## 2023-06-30 DIAGNOSIS — I890 Lymphedema, not elsewhere classified: Secondary | ICD-10-CM | POA: Diagnosis not present

## 2023-06-30 DIAGNOSIS — I272 Pulmonary hypertension, unspecified: Secondary | ICD-10-CM | POA: Diagnosis not present

## 2023-06-30 DIAGNOSIS — Z79899 Other long term (current) drug therapy: Secondary | ICD-10-CM | POA: Insufficient documentation

## 2023-06-30 DIAGNOSIS — G4733 Obstructive sleep apnea (adult) (pediatric): Secondary | ICD-10-CM | POA: Diagnosis not present

## 2023-06-30 DIAGNOSIS — J449 Chronic obstructive pulmonary disease, unspecified: Secondary | ICD-10-CM | POA: Diagnosis not present

## 2023-06-30 DIAGNOSIS — E785 Hyperlipidemia, unspecified: Secondary | ICD-10-CM | POA: Diagnosis not present

## 2023-06-30 DIAGNOSIS — I5022 Chronic systolic (congestive) heart failure: Secondary | ICD-10-CM | POA: Diagnosis not present

## 2023-06-30 DIAGNOSIS — I10 Essential (primary) hypertension: Secondary | ICD-10-CM | POA: Diagnosis not present

## 2023-06-30 DIAGNOSIS — I2510 Atherosclerotic heart disease of native coronary artery without angina pectoris: Secondary | ICD-10-CM | POA: Insufficient documentation

## 2023-06-30 DIAGNOSIS — Z7901 Long term (current) use of anticoagulants: Secondary | ICD-10-CM | POA: Insufficient documentation

## 2023-06-30 DIAGNOSIS — I482 Chronic atrial fibrillation, unspecified: Secondary | ICD-10-CM | POA: Insufficient documentation

## 2023-06-30 DIAGNOSIS — Z7952 Long term (current) use of systemic steroids: Secondary | ICD-10-CM | POA: Insufficient documentation

## 2023-06-30 DIAGNOSIS — E669 Obesity, unspecified: Secondary | ICD-10-CM | POA: Diagnosis not present

## 2023-06-30 DIAGNOSIS — I110 Hypertensive heart disease with heart failure: Secondary | ICD-10-CM | POA: Diagnosis not present

## 2023-06-30 DIAGNOSIS — J4489 Other specified chronic obstructive pulmonary disease: Secondary | ICD-10-CM | POA: Insufficient documentation

## 2023-06-30 DIAGNOSIS — I428 Other cardiomyopathies: Secondary | ICD-10-CM | POA: Diagnosis not present

## 2023-06-30 MED ORDER — EMPAGLIFLOZIN 10 MG PO TABS: 10.0000 mg | ORAL_TABLET | Freq: Every day | ORAL | 5 refills | Status: DC

## 2023-06-30 NOTE — Patient Instructions (Addendum)
Try to keep daily fluid intake to 60-64 ounces daily.    Begin jardiance as 1 tablet every day.    Vascular office will call you tomorrow to discuss appointment.
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
## 2023-07-01 ENCOUNTER — Encounter (INDEPENDENT_AMBULATORY_CARE_PROVIDER_SITE_OTHER): Payer: Self-pay | Admitting: Nurse Practitioner

## 2023-07-01 ENCOUNTER — Ambulatory Visit (INDEPENDENT_AMBULATORY_CARE_PROVIDER_SITE_OTHER): Payer: Medicare Other | Admitting: Nurse Practitioner

## 2023-07-01 VITALS — BP 116/72 | HR 83 | Resp 18 | Ht 64.0 in | Wt 254.0 lb

## 2023-07-01 DIAGNOSIS — I890 Lymphedema, not elsewhere classified: Secondary | ICD-10-CM | POA: Diagnosis not present

## 2023-07-01 DIAGNOSIS — I10 Essential (primary) hypertension: Secondary | ICD-10-CM

## 2023-07-01 DIAGNOSIS — I5022 Chronic systolic (congestive) heart failure: Secondary | ICD-10-CM | POA: Diagnosis not present

## 2023-07-01 DIAGNOSIS — I739 Peripheral vascular disease, unspecified: Secondary | ICD-10-CM | POA: Diagnosis not present
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
## 2023-07-02 ENCOUNTER — Encounter: Payer: Self-pay | Admitting: Family

## 2023-07-02 ENCOUNTER — Other Ambulatory Visit: Payer: Self-pay | Admitting: Family

## 2023-07-02 NOTE — Addendum Note (Signed)
Addended by: Electa Sniff on: 07/02/2023 03:52 PM   Modules accepted: Orders

## 2023-07-02 NOTE — Progress Notes (Signed)
error 

## 2023-07-03 ENCOUNTER — Other Ambulatory Visit
Admission: RE | Admit: 2023-07-03 | Discharge: 2023-07-03 | Disposition: A | Discharge status: Home / Self Care | Payer: Medicare Other | Source: Ambulatory Visit | Attending: Family | Admitting: Family

## 2023-07-03 DIAGNOSIS — I5022 Chronic systolic (congestive) heart failure: Secondary | ICD-10-CM | POA: Insufficient documentation

## 2023-07-05 LAB — MISC LABCORP TEST (SEND OUT): Labcorp test code: 143000

## 2023-07-07 ENCOUNTER — Encounter (HOSPITAL_COMMUNITY): Payer: Self-pay

## 2023-07-07 ENCOUNTER — Encounter: Payer: Self-pay | Admitting: Family

## 2023-07-07 ENCOUNTER — Other Ambulatory Visit (HOSPITAL_COMMUNITY): Payer: Self-pay

## 2023-07-07 ENCOUNTER — Telehealth (HOSPITAL_COMMUNITY): Payer: Self-pay

## 2023-07-07 NOTE — Telephone Encounter (Signed)
Advanced Heart Failure Patient Advocate Encounter  Review of patients chart for medication assistance opportunities.   This patient is eligible for a grant that is currently open and would cover the cost of Diltiazem, Entresto, Jardiance, Spironolactone. Need to confirm household size and income and obtain patient consent to enroll.  This patient may be eligible for assistance with Eliquis, if pt has spent 3% or more of AGI on medications for this year. Need to confirm income and pharmacy expenses before submitting application.  Left voicemail for patient, will also attempt to reach pt via mychart.  Burnell Blanks, CPhT Rx Patient Advocate Phone: [phone]

## 2023-07-08 ENCOUNTER — Other Ambulatory Visit: Payer: Self-pay

## 2023-07-08 ENCOUNTER — Other Ambulatory Visit (HOSPITAL_COMMUNITY): Payer: Self-pay

## 2023-07-08 DIAGNOSIS — I4811 Longstanding persistent atrial fibrillation: Secondary | ICD-10-CM

## 2023-07-08 MED ORDER — DILTIAZEM HCL ER COATED BEADS 300 MG PO CP24
300.0000 mg | ORAL_CAPSULE | Freq: Every day | ORAL | 3 refills | Status: DC
Start: 2023-07-08 — End: 2023-12-01

## 2023-07-08 NOTE — Telephone Encounter (Signed)
Advanced Heart Failure Patient Advocate Encounter  The patient was approved for a [employer] grant that will help cover the cost of Diltiazem, Entresto, Jardiance, Spironolactone.  Total amount awarded, $10,000.  Effective: 06/08/2023 - 06/06/2024.  BIN [id number] PCN [id number] [id number] ID [id number]  New rx for Diltiazem sent to preferred [facility]. Pharmacy provided with approval and processing information. Patient informed via phone.  Patient will be dropping off expense report for Eliquis application with [facility]. I will submit application along with OOP records.  Burnell Blanks, CPhT Rx Patient Advocate Phone: [phone]

## 2023-07-09 ENCOUNTER — Other Ambulatory Visit (HOSPITAL_COMMUNITY): Payer: Self-pay

## 2023-07-09 ENCOUNTER — Telehealth: Payer: Self-pay

## 2023-07-09 NOTE — Telephone Encounter (Signed)
Application for Eliquis faxed to [employer] on 07/09/23 along with expense reports. Application form attached to patient chart.

## 2023-07-09 NOTE — Telephone Encounter (Signed)
Medication Samples have been provided to the patient.  Drug name: Eliquis       Strength: 5 mg        Qty: 4 boxes  LOT: [id number]  Exp.Date: September 2025  Dosing instructions: 1 tablet twice daily  The patient has been instructed regarding the correct time, dose, and frequency of taking this medication, including desired effects and most common side effects.   Sofie Rower 10:58 AM 07/09/2023

## 2023-07-13 NOTE — Telephone Encounter (Signed)
[employer] is requesting clarification of application. Updated info and faxed on 7/22.
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
## 2023-07-15 ENCOUNTER — Ambulatory Visit: Payer: Medicare Other | Attending: Family | Admitting: Family

## 2023-07-15 ENCOUNTER — Encounter: Payer: Self-pay | Admitting: Family

## 2023-07-15 VITALS — BP 90/52 | HR 83 | Ht 64.0 in | Wt 248.0 lb

## 2023-07-15 DIAGNOSIS — I272 Pulmonary hypertension, unspecified: Secondary | ICD-10-CM | POA: Diagnosis not present

## 2023-07-15 DIAGNOSIS — E669 Obesity, unspecified: Secondary | ICD-10-CM | POA: Insufficient documentation

## 2023-07-15 DIAGNOSIS — I428 Other cardiomyopathies: Secondary | ICD-10-CM | POA: Diagnosis not present

## 2023-07-15 DIAGNOSIS — I482 Chronic atrial fibrillation, unspecified: Secondary | ICD-10-CM | POA: Diagnosis not present

## 2023-07-15 DIAGNOSIS — R0602 Shortness of breath: Secondary | ICD-10-CM | POA: Insufficient documentation

## 2023-07-15 DIAGNOSIS — I2510 Atherosclerotic heart disease of native coronary artery without angina pectoris: Secondary | ICD-10-CM

## 2023-07-15 DIAGNOSIS — Z79899 Other long term (current) drug therapy: Secondary | ICD-10-CM | POA: Diagnosis not present

## 2023-07-15 DIAGNOSIS — Z7984 Long term (current) use of oral hypoglycemic drugs: Secondary | ICD-10-CM | POA: Insufficient documentation

## 2023-07-15 DIAGNOSIS — Z8673 Personal history of transient ischemic attack (TIA), and cerebral infarction without residual deficits: Secondary | ICD-10-CM | POA: Diagnosis not present

## 2023-07-15 DIAGNOSIS — I5022 Chronic systolic (congestive) heart failure: Secondary | ICD-10-CM | POA: Diagnosis not present

## 2023-07-15 DIAGNOSIS — I890 Lymphedema, not elsewhere classified: Secondary | ICD-10-CM

## 2023-07-15 DIAGNOSIS — G4733 Obstructive sleep apnea (adult) (pediatric): Secondary | ICD-10-CM | POA: Diagnosis not present

## 2023-07-15 DIAGNOSIS — Z7952 Long term (current) use of systemic steroids: Secondary | ICD-10-CM | POA: Diagnosis not present

## 2023-07-15 DIAGNOSIS — I110 Hypertensive heart disease with heart failure: Secondary | ICD-10-CM | POA: Insufficient documentation

## 2023-07-15 DIAGNOSIS — J4489 Other specified chronic obstructive pulmonary disease: Secondary | ICD-10-CM | POA: Insufficient documentation

## 2023-07-15 DIAGNOSIS — J449 Chronic obstructive pulmonary disease, unspecified: Secondary | ICD-10-CM | POA: Diagnosis not present

## 2023-07-15 DIAGNOSIS — I10 Essential (primary) hypertension: Secondary | ICD-10-CM

## 2023-07-15 DIAGNOSIS — Z7901 Long term (current) use of anticoagulants: Secondary | ICD-10-CM | POA: Insufficient documentation

## 2023-07-15 DIAGNOSIS — Z8616 Personal history of COVID-19: Secondary | ICD-10-CM | POA: Insufficient documentation

## 2023-07-15 NOTE — Patient Instructions (Addendum)
I've placed the nutrition therapy referral.    [facility] work drawn.

## 2023-07-15 NOTE — Telephone Encounter (Signed)
Advanced Heart Failure Patient Advocate Encounter  Patient was approved to receive Eliquis from [facility] Effective 07/14/2023 to 12/22/2023  Informed patient by phone, provided contact number for [employer].

## 2023-07-17 ENCOUNTER — Other Ambulatory Visit
Admission: RE | Admit: 2023-07-17 | Discharge: 2023-07-17 | Disposition: A | Discharge status: Home / Self Care | Payer: Medicare Other | Attending: Family | Admitting: Family

## 2023-07-17 ENCOUNTER — Encounter: Payer: Self-pay | Admitting: Family

## 2023-07-17 DIAGNOSIS — I5022 Chronic systolic (congestive) heart failure: Secondary | ICD-10-CM | POA: Insufficient documentation

## 2023-07-17 LAB — BASIC METABOLIC PANEL
Anion gap: 9 (ref 5–15)
BUN: 26 mg/dL — ABNORMAL HIGH (ref 8–23)
CO2: 24 mmol/L (ref 22–32)
Calcium: 8.8 mg/dL — ABNORMAL LOW (ref 8.9–10.3)
Chloride: 106 mmol/L (ref 98–111)
Creatinine, Ser: 1.13 mg/dL — ABNORMAL HIGH (ref 0.44–1.00)
GFR, Estimated: 51 mL/min — ABNORMAL LOW (ref >60)
Glucose, Bld: 102 mg/dL — ABNORMAL HIGH (ref 70–99)
Potassium: 4.1 mmol/L (ref 3.5–5.1)
Sodium: 139 mmol/L (ref 135–145)

## 2023-07-17 LAB — LIPID PANEL
Cholesterol: 122 mg/dL (ref 0–200)
HDL: 58 mg/dL (ref >40)
LDL Cholesterol: 38 mg/dL (ref 0–99)
Total CHOL/HDL Ratio: 2.1 RATIO
Triglycerides: 130 mg/dL (ref <150)
VLDL: 26 mg/dL (ref 0–40)

## 2023-07-23 ENCOUNTER — Other Ambulatory Visit: Payer: Self-pay | Admitting: Gastroenterology

## 2023-07-27 ENCOUNTER — Telehealth: Payer: Self-pay | Admitting: Pharmacist

## 2023-07-27 NOTE — Addendum Note (Signed)
Addended by: Electa Sniff on: 07/27/2023 02:05 PM   Modules accepted: Orders

## 2023-07-27 NOTE — Telephone Encounter (Signed)
GLP1 one consultation requested by Clarisa Kindred , FNP   Previously tried meds: Mounjaro - nausea and lots GI trouble was started on step 2   Current meds that may affect weight: lasix 40 mg   Baseline weight/BMI: 246 lbs  Will see if Ozempic covered by the plan  On glipizide and Jardiance ( may need to stop glipizide as we titrate Ozempic dose up.    Diet:  -Breakfast: one egg, bacon and toast, instant oats -Lunch:ham sandwiches, instant noodle  -Dinner: grilled pork, potatoes  -Snacks: nuts, fruits, peanut butter,  -Drinks: water   Suggested low carb low fat healthy meal options to minimize GI related S/E from Ozempic   Exercise: walk in water 30-40 min   Chair yoga - 15 min every day   Family History:   Confirmed patient is not family history of medullary thyroid carcinoma (MTC) or Multiple Endocrine Neoplasia syndrome type 2 (MEN 2).   Social History:   alcohol:none Tobacco smoking: none    Labs: Lab Results  Component Value Date   HGBA1C 7.2 (A) 04/03/2023    Wt Readings from Last 1 Encounters:  07/15/23 248 lb (112.5 kg)    BP Readings from Last 1 Encounters:  07/15/23 (!) 90/52   Pulse Readings from Last 1 Encounters:  07/15/23 83       Component Value Date/Time   CHOL 122 07/17/2023 1013   CHOL 143 06/12/2021 1028   TRIG 130 07/17/2023 1013   HDL 58 07/17/2023 1013   HDL 73 06/12/2021 1028   CHOLHDL 2.1 07/17/2023 1013   VLDL 26 07/17/2023 1013   LDLCALC 38 07/17/2023 1013   LDLCALC 47 06/12/2021 1028

## 2023-07-28 ENCOUNTER — Other Ambulatory Visit (HOSPITAL_COMMUNITY): Payer: Self-pay

## 2023-07-28 ENCOUNTER — Other Ambulatory Visit: Payer: Self-pay | Admitting: Nurse Practitioner

## 2023-07-28 ENCOUNTER — Telehealth: Payer: Self-pay

## 2023-07-28 DIAGNOSIS — Z760 Encounter for issue of repeat prescription: Secondary | ICD-10-CM

## 2023-07-28 NOTE — Telephone Encounter (Signed)
Next 08/07/23

## 2023-07-28 NOTE — Telephone Encounter (Signed)
   Pts cost for Ozempic would be about/around 238.00$ due to the patient being in a coverage gap ( please see test claim below)

## 2023-07-29 ENCOUNTER — Ambulatory Visit: Payer: Medicare Other | Admitting: Nurse Practitioner

## 2023-07-29 ENCOUNTER — Other Ambulatory Visit (HOSPITAL_COMMUNITY): Payer: Self-pay

## 2023-07-30 NOTE — Telephone Encounter (Signed)
Pt called again for refills she is going out of town

## 2023-07-31 MED ORDER — SEMAGLUTIDE(0.25 OR 0.5MG/DOS) 2 MG/3ML ~~LOC~~ SOPN: 0.2500 mg | PEN_INJECTOR | SUBCUTANEOUS | 0 refills | Status: DC

## 2023-08-02 ENCOUNTER — Encounter (INDEPENDENT_AMBULATORY_CARE_PROVIDER_SITE_OTHER): Payer: Self-pay | Admitting: Vascular Surgery

## 2023-08-03 ENCOUNTER — Ambulatory Visit: Payer: Medicare Other | Admitting: Nurse Practitioner

## 2023-08-03 NOTE — Telephone Encounter (Signed)
Can we reach our to [facility] to see about her pump

## 2023-08-04 ENCOUNTER — Telehealth: Payer: Self-pay | Admitting: Nurse Practitioner

## 2023-08-04 NOTE — Telephone Encounter (Signed)
Left vm and sent mychart message to confirm 08/10/23 appointment-Laura [name]

## 2023-08-10 ENCOUNTER — Ambulatory Visit (INDEPENDENT_AMBULATORY_CARE_PROVIDER_SITE_OTHER): Payer: Medicare Other | Admitting: Nurse Practitioner

## 2023-08-10 ENCOUNTER — Encounter: Payer: Self-pay | Admitting: Family

## 2023-08-10 ENCOUNTER — Ambulatory Visit: Payer: Medicare Other | Attending: Family | Admitting: Family

## 2023-08-10 ENCOUNTER — Encounter: Payer: Self-pay | Admitting: Nurse Practitioner

## 2023-08-10 VITALS — BP 136/72 | HR 82 | Wt 256.0 lb

## 2023-08-10 VITALS — BP 102/60 | HR 60 | Temp 98.1°F | Resp 16 | Ht 64.0 in | Wt 253.8 lb

## 2023-08-10 DIAGNOSIS — I5022 Chronic systolic (congestive) heart failure: Secondary | ICD-10-CM | POA: Diagnosis not present

## 2023-08-10 DIAGNOSIS — I482 Chronic atrial fibrillation, unspecified: Secondary | ICD-10-CM

## 2023-08-10 DIAGNOSIS — I2510 Atherosclerotic heart disease of native coronary artery without angina pectoris: Secondary | ICD-10-CM | POA: Diagnosis not present

## 2023-08-10 DIAGNOSIS — Z0001 Encounter for general adult medical examination with abnormal findings: Secondary | ICD-10-CM

## 2023-08-10 DIAGNOSIS — Z6841 Body Mass Index (BMI) 40.0 and over, adult: Secondary | ICD-10-CM | POA: Insufficient documentation

## 2023-08-10 DIAGNOSIS — J449 Chronic obstructive pulmonary disease, unspecified: Secondary | ICD-10-CM | POA: Diagnosis not present

## 2023-08-10 DIAGNOSIS — E2839 Other primary ovarian failure: Secondary | ICD-10-CM | POA: Diagnosis not present

## 2023-08-10 DIAGNOSIS — I890 Lymphedema, not elsewhere classified: Secondary | ICD-10-CM | POA: Diagnosis not present

## 2023-08-10 DIAGNOSIS — I10 Essential (primary) hypertension: Secondary | ICD-10-CM | POA: Diagnosis not present

## 2023-08-10 DIAGNOSIS — E1165 Type 2 diabetes mellitus with hyperglycemia: Secondary | ICD-10-CM | POA: Diagnosis not present

## 2023-08-10 DIAGNOSIS — E669 Obesity, unspecified: Secondary | ICD-10-CM | POA: Insufficient documentation

## 2023-08-10 DIAGNOSIS — I428 Other cardiomyopathies: Secondary | ICD-10-CM | POA: Insufficient documentation

## 2023-08-10 DIAGNOSIS — Z23 Encounter for immunization: Secondary | ICD-10-CM | POA: Diagnosis not present

## 2023-08-10 DIAGNOSIS — I110 Hypertensive heart disease with heart failure: Secondary | ICD-10-CM | POA: Insufficient documentation

## 2023-08-10 DIAGNOSIS — J9611 Chronic respiratory failure with hypoxia: Secondary | ICD-10-CM

## 2023-08-10 DIAGNOSIS — G4733 Obstructive sleep apnea (adult) (pediatric): Secondary | ICD-10-CM

## 2023-08-10 LAB — POCT GLYCOSYLATED HEMOGLOBIN (HGB A1C): Hemoglobin A1C: 6.3 % — AB (ref 4.0–5.6)

## 2023-08-10 MED ORDER — PNEUMOCOCCAL 20-VAL CONJ VACC 0.5 ML IM SUSY: 0.5000 mL | PREFILLED_SYRINGE | Freq: Once | INTRAMUSCULAR | 0 refills | Status: AC
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
## 2023-08-13 ENCOUNTER — Other Ambulatory Visit: Payer: Self-pay | Admitting: Cardiovascular Disease

## 2023-08-13 DIAGNOSIS — I25118 Atherosclerotic heart disease of native coronary artery with other forms of angina pectoris: Secondary | ICD-10-CM

## 2023-08-13 DIAGNOSIS — Z8673 Personal history of transient ischemic attack (TIA), and cerebral infarction without residual deficits: Secondary | ICD-10-CM

## 2023-08-13 DIAGNOSIS — E785 Hyperlipidemia, unspecified: Secondary | ICD-10-CM

## 2023-08-17 ENCOUNTER — Ambulatory Visit: Payer: Medicare Other | Admitting: Dietician

## 2023-08-30 ENCOUNTER — Encounter: Payer: Self-pay | Admitting: Nurse Practitioner

## 2023-08-31 ENCOUNTER — Telehealth: Payer: Self-pay | Admitting: Pharmacist

## 2023-08-31 ENCOUNTER — Telehealth: Payer: Self-pay | Admitting: Student

## 2023-08-31 DIAGNOSIS — R0600 Dyspnea, unspecified: Secondary | ICD-10-CM | POA: Diagnosis not present

## 2023-08-31 DIAGNOSIS — R0602 Shortness of breath: Secondary | ICD-10-CM | POA: Diagnosis not present

## 2023-08-31 DIAGNOSIS — R0689 Other abnormalities of breathing: Secondary | ICD-10-CM | POA: Diagnosis not present

## 2023-08-31 NOTE — Telephone Encounter (Signed)
Spoke to patient, reports she took 2 doses so far, lost almost 10 lbs tolerates the current dose 0.25 mg Ozempic well.   Will call for follow up in 4 weeks.

## 2023-08-31 NOTE — Telephone Encounter (Signed)
Call to titrate Ozempic dose N/A LVM.

## 2023-08-31 NOTE — Telephone Encounter (Signed)
Follow Up:    Patient is returning your call from today. 

## 2023-09-01 NOTE — Telephone Encounter (Signed)
See other encounter for Ozempic dose titration

## 2023-09-14 ENCOUNTER — Encounter: Payer: Self-pay | Admitting: Family

## 2023-09-14 ENCOUNTER — Ambulatory Visit: Payer: Medicare Other | Attending: Family | Admitting: Family

## 2023-09-14 VITALS — BP 120/71 | HR 67 | Ht 64.0 in | Wt 247.0 lb

## 2023-09-14 DIAGNOSIS — I110 Hypertensive heart disease with heart failure: Secondary | ICD-10-CM | POA: Insufficient documentation

## 2023-09-14 DIAGNOSIS — I2510 Atherosclerotic heart disease of native coronary artery without angina pectoris: Secondary | ICD-10-CM | POA: Diagnosis not present

## 2023-09-14 DIAGNOSIS — Z7901 Long term (current) use of anticoagulants: Secondary | ICD-10-CM | POA: Insufficient documentation

## 2023-09-14 DIAGNOSIS — J4489 Other specified chronic obstructive pulmonary disease: Secondary | ICD-10-CM | POA: Insufficient documentation

## 2023-09-14 DIAGNOSIS — Z8673 Personal history of transient ischemic attack (TIA), and cerebral infarction without residual deficits: Secondary | ICD-10-CM | POA: Diagnosis not present

## 2023-09-14 DIAGNOSIS — I890 Lymphedema, not elsewhere classified: Secondary | ICD-10-CM | POA: Diagnosis not present

## 2023-09-14 DIAGNOSIS — J449 Chronic obstructive pulmonary disease, unspecified: Secondary | ICD-10-CM | POA: Diagnosis not present

## 2023-09-14 DIAGNOSIS — I10 Essential (primary) hypertension: Secondary | ICD-10-CM | POA: Diagnosis not present

## 2023-09-14 DIAGNOSIS — G4733 Obstructive sleep apnea (adult) (pediatric): Secondary | ICD-10-CM | POA: Insufficient documentation

## 2023-09-14 DIAGNOSIS — I482 Chronic atrial fibrillation, unspecified: Secondary | ICD-10-CM | POA: Insufficient documentation

## 2023-09-14 DIAGNOSIS — Z7952 Long term (current) use of systemic steroids: Secondary | ICD-10-CM | POA: Diagnosis not present

## 2023-09-14 DIAGNOSIS — I5022 Chronic systolic (congestive) heart failure: Secondary | ICD-10-CM | POA: Insufficient documentation

## 2023-09-14 DIAGNOSIS — R110 Nausea: Secondary | ICD-10-CM | POA: Insufficient documentation

## 2023-09-14 DIAGNOSIS — I428 Other cardiomyopathies: Secondary | ICD-10-CM | POA: Diagnosis not present

## 2023-09-14 DIAGNOSIS — E669 Obesity, unspecified: Secondary | ICD-10-CM | POA: Diagnosis not present

## 2023-09-14 DIAGNOSIS — Z79899 Other long term (current) drug therapy: Secondary | ICD-10-CM | POA: Diagnosis not present
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
## 2023-09-14 NOTE — Patient Instructions (Signed)
Labs done today, your results will be available in MyChart, we will contact you for abnormal readings.   Please call your GI doctor to schedule and appointment.

## 2023-09-15 ENCOUNTER — Encounter: Payer: Self-pay | Admitting: Family

## 2023-09-15 LAB — COMPREHENSIVE METABOLIC PANEL
ALT: 14 IU/L (ref 0–32)
AST: 13 IU/L (ref 0–40)
Albumin: 4.1 g/dL (ref 3.8–4.8)
Alkaline Phosphatase: 49 IU/L (ref 44–121)
BUN/Creatinine Ratio: 10 — ABNORMAL LOW (ref 12–28)
BUN: 10 mg/dL (ref 8–27)
Bilirubin Total: 0.9 mg/dL (ref 0.0–1.2)
CO2: 22 mmol/L (ref 20–29)
Calcium: 9.8 mg/dL (ref 8.7–10.3)
Chloride: 103 mmol/L (ref 96–106)
Creatinine, Ser: 1.03 mg/dL — ABNORMAL HIGH (ref 0.57–1.00)
Globulin, Total: 2.2 g/dL (ref 1.5–4.5)
Glucose: 114 mg/dL — ABNORMAL HIGH (ref 70–99)
Potassium: 3.9 mmol/L (ref 3.5–5.2)
Sodium: 143 mmol/L (ref 134–144)
Total Protein: 6.3 g/dL (ref 6.0–8.5)
eGFR: 57 mL/min/{1.73_m2} — ABNORMAL LOW (ref >59)

## 2023-09-16 ENCOUNTER — Encounter: Payer: Self-pay | Admitting: Cardiovascular Disease

## 2023-09-17 ENCOUNTER — Other Ambulatory Visit: Payer: Self-pay | Admitting: Nurse Practitioner

## 2023-09-17 DIAGNOSIS — Z760 Encounter for issue of repeat prescription: Secondary | ICD-10-CM

## 2023-09-17 NOTE — Telephone Encounter (Signed)
Please review

## 2023-09-24 ENCOUNTER — Other Ambulatory Visit: Payer: Self-pay | Admitting: Nurse Practitioner

## 2023-09-24 DIAGNOSIS — Z760 Encounter for issue of repeat prescription: Secondary | ICD-10-CM

## 2023-09-25 ENCOUNTER — Other Ambulatory Visit: Payer: Self-pay | Admitting: Gastroenterology

## 2023-09-28 ENCOUNTER — Telehealth: Payer: Self-pay

## 2023-09-28 NOTE — Telephone Encounter (Signed)
Can you please contact patient and schedule a follow up appointment (for acute diarrhea) with Inetta Fermo per Dr. Tobi Bastos. Thank you.

## 2023-09-29 ENCOUNTER — Telehealth: Payer: Self-pay | Admitting: Pharmacist

## 2023-09-29 ENCOUNTER — Other Ambulatory Visit: Payer: Self-pay | Admitting: Cardiovascular Disease

## 2023-09-29 NOTE — Telephone Encounter (Signed)
Spoke to patient, her FBG ~80-90 range. She did not increase Ozempic dose last time as she was afraid of s/e.Ready to up Ozempic dose from 0.25 mg one week to 0.5 mg once a week from this week.   Follow up in 4 weeks for further dose titration.

## 2023-10-05 ENCOUNTER — Encounter: Payer: Self-pay | Admitting: Family

## 2023-10-08 ENCOUNTER — Telehealth: Payer: Self-pay | Admitting: Gastroenterology

## 2023-10-08 NOTE — Telephone Encounter (Signed)
I called to schedule office visit. She stated that she will have to call me later this afternoon.

## 2023-10-09 ENCOUNTER — Encounter: Payer: Medicare Other | Admitting: Family

## 2023-10-13 ENCOUNTER — Encounter: Payer: Self-pay | Admitting: Podiatry

## 2023-10-13 ENCOUNTER — Ambulatory Visit (INDEPENDENT_AMBULATORY_CARE_PROVIDER_SITE_OTHER): Payer: Medicare Other | Admitting: Podiatry

## 2023-10-13 DIAGNOSIS — M79675 Pain in left toe(s): Secondary | ICD-10-CM | POA: Diagnosis not present

## 2023-10-13 DIAGNOSIS — M79674 Pain in right toe(s): Secondary | ICD-10-CM | POA: Diagnosis not present

## 2023-10-13 DIAGNOSIS — B351 Tinea unguium: Secondary | ICD-10-CM

## 2023-10-13 NOTE — Progress Notes (Signed)
Chief Complaint  Patient presents with   Toe Pain    Hallux right - previously took toenail off, but now that its grown back its been real tender, especially on the lateral border, on Eliquis from her stroke and diabetic - last A1c was 6.3   New Patient (Initial Visit)    Est pt 2022    SUBJECTIVE Patient presents to office today complaining of elongated, thickened nails that cause pain while ambulating in shoes.  Patient is unable to trim their own nails. Patient is here for further evaluation and treatment.  Past Medical History:  Diagnosis Date   Asthma    Chronic atrial fibrillation (HCC)    a. diagnosed in 09/2016; b. failed flecainide and propafenone due to LE swelling and SOB, could not afford Multaq; c. CHADS2VASc => 5 (CHF, HTN, age x 1, nonobs CAD, female)--> Eliquis   COPD (chronic obstructive pulmonary disease) (HCC)    GERD (gastroesophageal reflux disease)    HFmrEF (heart failure with mid-range ejection fraction) (HCC)    a. 12/2019 Echo: EF 40-45%. b. 01/2022 Echo: EF 40-45%.   Hyperlipidemia    Hypertension    NICM (nonischemic cardiomyopathy) (HCC)    a. 12/2019 Echo: EF 40-45%, glob HK, mildly reduced RV fxn, sev dil LA. *HR 130 (afib) during study.   Nonobstructive CAD (coronary artery disease)    a. Lexiscan Myoview 10/2016: no evidence of ischemia, EF 53%; b. 02/2020 Cath: LM nl, LAD 20p, 55m, LCX 20ost, OM1/2/3 nl, LPDA nl, LPL1/2 nl, LPAV nl, RCA small, nl.   Obesity    Obstructive sleep apnea    Pulmonary hypertension (HCC)    Sleep apnea    Stroke (HCC)    Systolic dysfunction    a. TTE 10/2016: EF 50%, mild LVH, moderately dilated LA, moderate MR/TR, mild pulmonary hypertension    Allergies  Allergen Reactions   Flecainide Shortness Of Breath   Metoprolol Shortness Of Breath, Swelling and Other (See Comments)    "My limbs swell" also   Propafenone Shortness Of Breath, Swelling and Other (See Comments)    "My limbs swell" also   Rivaroxaban Swelling  and Other (See Comments)    Xarelto- "My limbs swell" Other reaction(s): Muscle Pain, Other (See Comments) Other reaction(s): Other (See Comments) Xarelto- "My limbs swell"   Tirzepatide Diarrhea and Nausea And Vomiting    Severe adverse side effects of mounjaro     OBJECTIVE General Patient is awake, alert, and oriented x 3 and in no acute distress. Derm Skin is dry and supple bilateral. Negative open lesions or macerations. Remaining integument unremarkable. Nails are tender, long, thickened and dystrophic with subungual debris, consistent with onychomycosis, 1-5 bilateral. No signs of infection noted. Vasc  DP and PT pedal pulses palpable bilaterally. Temperature gradient within normal limits.  Neuro Epicritic and protective threshold sensation grossly intact bilaterally.  Musculoskeletal Exam No symptomatic pedal deformities noted bilateral. Muscular strength within normal limits.  ASSESSMENT 1.  Pain due to onychomycosis of toenails both 2.  Ingrown toenail lateral aspect of the right great toe  PLAN OF CARE 1. Patient evaluated today.  2. Instructed to maintain good pedal hygiene and foot care.  3. Mechanical debridement of nails 1-5 bilaterally performed using a nail nipper. Filed with dremel without incident.  Patient felt significant relief of the ingrown 4. Return to clinic in 3 mos.    Felecia Shelling, DPM [facility]  Dr. Felecia Shelling, DPM    [address], [place] [address]                Office [phone]  Fax [phone]

## 2023-10-14 NOTE — Telephone Encounter (Signed)
Patient has stated that she will call us back when she had time.

## 2023-10-19 ENCOUNTER — Ambulatory Visit: Payer: Medicare Other | Attending: Family | Admitting: Family

## 2023-10-19 ENCOUNTER — Encounter: Payer: Self-pay | Admitting: Family

## 2023-10-19 VITALS — BP 98/45 | HR 57 | Wt 246.0 lb

## 2023-10-19 DIAGNOSIS — I10 Essential (primary) hypertension: Secondary | ICD-10-CM

## 2023-10-19 DIAGNOSIS — G4733 Obstructive sleep apnea (adult) (pediatric): Secondary | ICD-10-CM | POA: Insufficient documentation

## 2023-10-19 DIAGNOSIS — I482 Chronic atrial fibrillation, unspecified: Secondary | ICD-10-CM | POA: Diagnosis not present

## 2023-10-19 DIAGNOSIS — E669 Obesity, unspecified: Secondary | ICD-10-CM | POA: Diagnosis not present

## 2023-10-19 DIAGNOSIS — I2510 Atherosclerotic heart disease of native coronary artery without angina pectoris: Secondary | ICD-10-CM | POA: Diagnosis not present

## 2023-10-19 DIAGNOSIS — J4489 Other specified chronic obstructive pulmonary disease: Secondary | ICD-10-CM | POA: Diagnosis not present

## 2023-10-19 DIAGNOSIS — I110 Hypertensive heart disease with heart failure: Secondary | ICD-10-CM | POA: Diagnosis not present

## 2023-10-19 DIAGNOSIS — Z8673 Personal history of transient ischemic attack (TIA), and cerebral infarction without residual deficits: Secondary | ICD-10-CM | POA: Diagnosis not present

## 2023-10-19 DIAGNOSIS — I5022 Chronic systolic (congestive) heart failure: Secondary | ICD-10-CM | POA: Insufficient documentation

## 2023-10-19 DIAGNOSIS — I428 Other cardiomyopathies: Secondary | ICD-10-CM | POA: Diagnosis not present

## 2023-10-19 DIAGNOSIS — Z7901 Long term (current) use of anticoagulants: Secondary | ICD-10-CM | POA: Insufficient documentation

## 2023-10-19 DIAGNOSIS — J449 Chronic obstructive pulmonary disease, unspecified: Secondary | ICD-10-CM

## 2023-10-19 DIAGNOSIS — E119 Type 2 diabetes mellitus without complications: Secondary | ICD-10-CM | POA: Insufficient documentation
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
## 2023-10-19 NOTE — Patient Instructions (Addendum)
Decrease entresto to 1/2 tablet in the morning and 1/2 tablet in the evening

## 2023-10-21 ENCOUNTER — Other Ambulatory Visit: Payer: Self-pay | Admitting: Cardiovascular Disease

## 2023-10-21 DIAGNOSIS — I5022 Chronic systolic (congestive) heart failure: Secondary | ICD-10-CM

## 2023-10-23 ENCOUNTER — Other Ambulatory Visit: Payer: Self-pay | Admitting: Nurse Practitioner

## 2023-10-23 DIAGNOSIS — J9611 Chronic respiratory failure with hypoxia: Secondary | ICD-10-CM

## 2023-10-23 DIAGNOSIS — Z760 Encounter for issue of repeat prescription: Secondary | ICD-10-CM

## 2023-10-23 NOTE — Telephone Encounter (Signed)
Please review

## 2023-10-27 ENCOUNTER — Telehealth: Payer: Medicare Other | Admitting: Nurse Practitioner

## 2023-10-27 ENCOUNTER — Encounter: Payer: Self-pay | Admitting: Nurse Practitioner

## 2023-10-27 DIAGNOSIS — H43813 Vitreous degeneration, bilateral: Secondary | ICD-10-CM | POA: Diagnosis not present

## 2023-10-27 DIAGNOSIS — Z961 Presence of intraocular lens: Secondary | ICD-10-CM | POA: Diagnosis not present

## 2023-10-27 DIAGNOSIS — E119 Type 2 diabetes mellitus without complications: Secondary | ICD-10-CM | POA: Diagnosis not present

## 2023-10-27 DIAGNOSIS — H524 Presbyopia: Secondary | ICD-10-CM | POA: Diagnosis not present

## 2023-10-27 DIAGNOSIS — H52223 Regular astigmatism, bilateral: Secondary | ICD-10-CM | POA: Diagnosis not present

## 2023-10-27 DIAGNOSIS — Z7984 Long term (current) use of oral hypoglycemic drugs: Secondary | ICD-10-CM | POA: Diagnosis not present

## 2023-10-27 DIAGNOSIS — H10233 Serous conjunctivitis, except viral, bilateral: Secondary | ICD-10-CM | POA: Diagnosis not present

## 2023-10-27 DIAGNOSIS — H5213 Myopia, bilateral: Secondary | ICD-10-CM | POA: Diagnosis not present

## 2023-10-28 ENCOUNTER — Encounter: Payer: Self-pay | Admitting: Nurse Practitioner

## 2023-10-28 ENCOUNTER — Telehealth (INDEPENDENT_AMBULATORY_CARE_PROVIDER_SITE_OTHER): Payer: Medicare Other | Admitting: Nurse Practitioner

## 2023-10-28 VITALS — BP 115/64 | HR 82 | Ht 64.0 in | Wt 243.0 lb

## 2023-10-28 DIAGNOSIS — R051 Acute cough: Secondary | ICD-10-CM

## 2023-10-28 DIAGNOSIS — J22 Unspecified acute lower respiratory infection: Secondary | ICD-10-CM

## 2023-10-28 MED ORDER — AMOXICILLIN-POT CLAVULANATE 875-125 MG PO TABS
1.0000 | ORAL_TABLET | Freq: Two times a day (BID) | ORAL | 0 refills | Status: AC
Start: 2023-10-28 — End: 2023-11-07

## 2023-10-28 MED ORDER — HYDROCOD POLI-CHLORPHE POLI ER 10-8 MG/5ML PO SUER: 5.0000 mL | Freq: Two times a day (BID) | ORAL | 0 refills | Status: DC | PRN

## 2023-10-28 MED ORDER — PREDNISONE 10 MG (21) PO TBPK
ORAL_TABLET | ORAL | 0 refills | Status: DC
Start: 2023-10-28 — End: 2023-11-25
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
## 2023-11-02 ENCOUNTER — Telehealth: Payer: Self-pay | Admitting: Pharmacist

## 2023-11-02 MED ORDER — OZEMPIC (1 MG/DOSE) 2 MG/1.5ML ~~LOC~~ SOPN: 1.0000 mg | PEN_INJECTOR | SUBCUTANEOUS | 0 refills | Status: DC

## 2023-11-02 NOTE — Telephone Encounter (Signed)
Weight loss- 20 lbs FBG~ <100 mg/dl and post meal <106 mg/dl Tolerating current dose Ozempic well. Ready to move on to 1 mg once a week   Pres[facility] for 1 [facility]

## 2023-11-06 ENCOUNTER — Telehealth: Payer: Self-pay | Admitting: Cardiovascular Disease

## 2023-11-06 ENCOUNTER — Encounter: Payer: Self-pay | Admitting: Family

## 2023-11-06 NOTE — Telephone Encounter (Signed)
Laura [name], [facility]      11/02/23  1:04 PM Note Weight loss- 20 lbs FBG~ <100 mg/dl and post meal <188 mg/dl Tolerating current dose Ozempic well. Ready to move on to 1 mg once a week    Prescription for 1 mg [facility]

## 2023-11-06 NOTE — Telephone Encounter (Signed)
*  STAT* If patient is at the pharmacy, call can be transferred to refill team.   1. Which medications need to be refilled? (please list name of each medication and dose if known) new prescription for Ozempic- she said her dose have increased   2. Would you like to learn more about the convenience, safety, & potential cost savings by using the [facility]?    3. Are you open to using the [facility] (Type [facility]. .   4. Which pharmacy/location (including street and city if local pharmacy) is medication to be sent to? [facility] [address], [place]   5. Do they need a 30 day or 90 day supply?

## 2023-11-08 ENCOUNTER — Other Ambulatory Visit: Payer: Self-pay | Admitting: Cardiovascular Disease

## 2023-11-08 DIAGNOSIS — I25118 Atherosclerotic heart disease of native coronary artery with other forms of angina pectoris: Secondary | ICD-10-CM

## 2023-11-08 DIAGNOSIS — Z8673 Personal history of transient ischemic attack (TIA), and cerebral infarction without residual deficits: Secondary | ICD-10-CM

## 2023-11-08 DIAGNOSIS — E785 Hyperlipidemia, unspecified: Secondary | ICD-10-CM

## 2023-11-12 ENCOUNTER — Telehealth: Payer: Self-pay | Admitting: Cardiovascular Disease

## 2023-11-12 NOTE — Telephone Encounter (Signed)
Pt c/o medication issue:  1. Name of Medication:   Semaglutide, 1 MG/DOSE, (OZEMPIC, 1 MG/DOSE,) 2 MG/1.5ML SOPN    2. How are you currently taking this medication (dosage and times per day)?   3. Are you having a reaction (difficulty breathing--STAT)?   4. What is your medication issue? Patient was told by her pharmacy that she would need a 3 mL called in for this medication for dosing. Requesting this be sent in.

## 2023-11-13 MED ORDER — OZEMPIC (1 MG/DOSE) 2 MG/1.5ML ~~LOC~~ SOPN: 1.0000 mg | PEN_INJECTOR | SUBCUTANEOUS | 0 refills | Status: DC

## 2023-11-21 ENCOUNTER — Other Ambulatory Visit: Payer: Self-pay | Admitting: Medical

## 2023-11-25 ENCOUNTER — Ambulatory Visit: Payer: Medicare Other | Attending: Family | Admitting: Family

## 2023-11-25 VITALS — BP 134/80 | HR 56 | Wt 240.0 lb

## 2023-11-25 DIAGNOSIS — I110 Hypertensive heart disease with heart failure: Secondary | ICD-10-CM | POA: Diagnosis not present

## 2023-11-25 DIAGNOSIS — I2510 Atherosclerotic heart disease of native coronary artery without angina pectoris: Secondary | ICD-10-CM | POA: Diagnosis not present

## 2023-11-25 DIAGNOSIS — I428 Other cardiomyopathies: Secondary | ICD-10-CM | POA: Diagnosis not present

## 2023-11-25 DIAGNOSIS — E669 Obesity, unspecified: Secondary | ICD-10-CM | POA: Insufficient documentation

## 2023-11-25 DIAGNOSIS — R0602 Shortness of breath: Secondary | ICD-10-CM | POA: Insufficient documentation

## 2023-11-25 DIAGNOSIS — Z7984 Long term (current) use of oral hypoglycemic drugs: Secondary | ICD-10-CM | POA: Insufficient documentation

## 2023-11-25 DIAGNOSIS — I5022 Chronic systolic (congestive) heart failure: Secondary | ICD-10-CM | POA: Insufficient documentation

## 2023-11-25 DIAGNOSIS — M25561 Pain in right knee: Secondary | ICD-10-CM | POA: Diagnosis not present

## 2023-11-25 DIAGNOSIS — Z7901 Long term (current) use of anticoagulants: Secondary | ICD-10-CM | POA: Insufficient documentation

## 2023-11-25 DIAGNOSIS — G4733 Obstructive sleep apnea (adult) (pediatric): Secondary | ICD-10-CM | POA: Diagnosis not present

## 2023-11-25 DIAGNOSIS — Z79899 Other long term (current) drug therapy: Secondary | ICD-10-CM | POA: Insufficient documentation

## 2023-11-25 DIAGNOSIS — Z8616 Personal history of COVID-19: Secondary | ICD-10-CM | POA: Insufficient documentation

## 2023-11-25 DIAGNOSIS — I482 Chronic atrial fibrillation, unspecified: Secondary | ICD-10-CM | POA: Diagnosis not present

## 2023-11-25 DIAGNOSIS — I10 Essential (primary) hypertension: Secondary | ICD-10-CM | POA: Diagnosis not present

## 2023-11-25 DIAGNOSIS — Z8673 Personal history of transient ischemic attack (TIA), and cerebral infarction without residual deficits: Secondary | ICD-10-CM | POA: Insufficient documentation

## 2023-11-25 DIAGNOSIS — J449 Chronic obstructive pulmonary disease, unspecified: Secondary | ICD-10-CM

## 2023-11-25 DIAGNOSIS — J4489 Other specified chronic obstructive pulmonary disease: Secondary | ICD-10-CM | POA: Diagnosis not present

## 2023-11-25 NOTE — Patient Instructions (Signed)
It was great seeing you today, keep up the good work!   If you receive a satisfaction survey regarding the [facility], please take the time to fill it out. This way we can continue to provide excellent care and make any changes that need to be made.
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
## 2023-11-26 ENCOUNTER — Encounter: Payer: Self-pay | Admitting: Family

## 2023-11-26 ENCOUNTER — Encounter: Payer: Self-pay | Admitting: Nurse Practitioner

## 2023-11-26 LAB — BASIC METABOLIC PANEL
BUN/Creatinine Ratio: 14 (ref 12–28)
BUN: 16 mg/dL (ref 8–27)
CO2: 24 mmol/L (ref 20–29)
Calcium: 10 mg/dL (ref 8.7–10.3)
Chloride: 99 mmol/L (ref 96–106)
Creatinine, Ser: 1.15 mg/dL — ABNORMAL HIGH (ref 0.57–1.00)
Glucose: 134 mg/dL — ABNORMAL HIGH (ref 70–99)
Potassium: 4.8 mmol/L (ref 3.5–5.2)
Sodium: 141 mmol/L (ref 134–144)
eGFR: 50 mL/min/{1.73_m2} — ABNORMAL LOW (ref >59)

## 2023-11-26 MED ORDER — FLUCONAZOLE 150 MG PO TABS: 150.0000 mg | ORAL_TABLET | Freq: Once | ORAL | 0 refills | Status: AC

## 2023-11-30 DIAGNOSIS — J188 Other pneumonia, unspecified organism: Secondary | ICD-10-CM | POA: Diagnosis not present

## 2023-11-30 DIAGNOSIS — I517 Cardiomegaly: Secondary | ICD-10-CM | POA: Diagnosis not present

## 2023-11-30 DIAGNOSIS — I700 Atherosclerosis of aorta: Secondary | ICD-10-CM | POA: Diagnosis not present

## 2023-11-30 DIAGNOSIS — J984 Other disorders of lung: Secondary | ICD-10-CM | POA: Diagnosis not present

## 2023-12-01 ENCOUNTER — Ambulatory Visit: Payer: Medicare Other | Attending: Cardiovascular Disease | Admitting: Cardiovascular Disease

## 2023-12-01 ENCOUNTER — Encounter: Payer: Self-pay | Admitting: Cardiovascular Disease

## 2023-12-01 VITALS — BP 90/40 | HR 62 | Ht 64.0 in | Wt 239.4 lb

## 2023-12-01 DIAGNOSIS — I2510 Atherosclerotic heart disease of native coronary artery without angina pectoris: Secondary | ICD-10-CM | POA: Diagnosis not present

## 2023-12-01 DIAGNOSIS — I5022 Chronic systolic (congestive) heart failure: Secondary | ICD-10-CM | POA: Insufficient documentation

## 2023-12-01 DIAGNOSIS — I482 Chronic atrial fibrillation, unspecified: Secondary | ICD-10-CM | POA: Diagnosis not present

## 2023-12-01 DIAGNOSIS — G4730 Sleep apnea, unspecified: Secondary | ICD-10-CM | POA: Diagnosis not present

## 2023-12-01 DIAGNOSIS — I10 Essential (primary) hypertension: Secondary | ICD-10-CM | POA: Diagnosis not present

## 2023-12-01 DIAGNOSIS — E785 Hyperlipidemia, unspecified: Secondary | ICD-10-CM | POA: Insufficient documentation

## 2023-12-01 MED ORDER — DILTIAZEM HCL ER COATED BEADS 240 MG PO CP24: 240.0000 mg | ORAL_CAPSULE | Freq: Every day | ORAL | 3 refills | Status: DC

## 2023-12-01 NOTE — Patient Instructions (Signed)
Medication Instructions:  DECREASE the Diltaizem to 240 mg once daily   Lab Work: None ordered If you have labs (blood work) drawn today and your tests are completely normal, you will receive your results only by: MyChart Message (if you have MyChart) OR A paper copy in the mail If you have any lab test that is abnormal or we need to change your treatment, we will call you to review the results.   Testing/Procedures: None ordered   Follow-Up: At [facility], you and your health needs are our priority.  As part of our continuing mission to provide you with exceptional heart care, we have created designated Provider Care Teams.  These Care Teams include your primary Cardiologist (physician) and Advanced Practice Providers (APPs -  Physician Assistants and Nurse Practitioners) who all work together to provide you with the care you need, when you need it.  We recommend signing up for the patient portal called "MyChart".  Sign up information is provided on this After Visit Summary.  MyChart is used to connect with patients for Virtual Visits (Telemedicine).  Patients are able to view lab/test results, encounter notes, upcoming appointments, etc.  Non-urgent messages can be sent to your provider as well.   To learn more about what you can do with MyChart, go to [email or web address].    Your next appointment:   12 month(s)  Provider:   You may see Lorine Bears, MD or one of the following Advanced Practice Providers on your designated Care Team:   Nicolasa Ducking, NP Eula Listen, PA-C Cadence Fransico Michael, PA-C Charlsie Quest, NP Carlos Levering, NP

## 2023-12-01 NOTE — Progress Notes (Signed)
Cardiology Office Note   Date:  12/01/2023   ID:  [name], [name] 1951/04/25, MRN [id number]  PCP:  Sallyanne Kuster, NP  Cardiologist:   Lorine Bears, MD   Chief Complaint  Patient presents with   Follow-up    6 month f/u c/o low BP, dizziness and nausea. Pt recently had fall playing with Grandchildren and mentioned that she hurt right leg won't lift due to it being painful. Meds reviewed verbally with pt.       History of Present Illness: Laura [name] is a 72 y.o. female who is here today for a follow-up visit regarding chronic atrial fibrillation and chronic systolic heart failure.  The patient has chronic medical conditions that include obesity, sleep apnea on CPAP, asthma, chronic systolic heart failure due to nonischemic cardiomyopathy, nonobstructive CAD, CVA and essential hypertension. She was diagnosed with atrial fibrillation in October 2017.She did not tolerate multiple antiarrhythmic medications and has been treated with rate control. She was hospitalized in September of 2020 with ischemic colitis.  Angiogram showed no acute obstructive disease.  She underwent ileum and right colon resection.    She did not tolerate metoprolol due to increased dyspnea.  She underwent a right and left cardiac catheterization in March 2021 to evaluate pulmonary hypertension and exertional dyspnea.  It showed mild nonobstructive coronary artery disease.  Right heart catheterization showed moderately elevated filling pressures with mild pulmonary hypertension and normal cardiac output.  Her pulmonary wedge pressure at that time was 24 mmHg.    She was hospitalized in November of 2021 with acute right MCA stroke in the setting of being off anticoagulation for lumbar injection.  Echocardiogram during that admission showed an EF of 45 to 50%.  Most recent echocardiogram last month showed an EF of 40 to 45% with mild to moderate mitral regurgitation. She struggles with bilateral leg  edema worse on the left side due to suspected lymphedema.    She has been following up with the heart failure clinic.  She has been doing well overall with improved shortness of breath and no chest pain.  Lower extremity edema also improved.  She started Ozempic and she is losing weight.  She does complain of intermittent dizziness.     Past Medical History:  Diagnosis Date   Asthma    Chronic atrial fibrillation (HCC)    a. diagnosed in 09/2016; b. failed flecainide and propafenone due to LE swelling and SOB, could not afford Multaq; c. CHADS2VASc => 5 (CHF, HTN, age x 1, nonobs CAD, female)--> Eliquis   COPD (chronic obstructive pulmonary disease) (HCC)    GERD (gastroesophageal reflux disease)    HFmrEF (heart failure with mid-range ejection fraction) (HCC)    a. 12/2019 Echo: EF 40-45%. b. 01/2022 Echo: EF 40-45%.   Hyperlipidemia    Hypertension    NICM (nonischemic cardiomyopathy) (HCC)    a. 12/2019 Echo: EF 40-45%, glob HK, mildly reduced RV fxn, sev dil LA. *HR 130 (afib) during study.   Nonobstructive CAD (coronary artery disease)    a. Lexiscan Myoview 10/2016: no evidence of ischemia, EF 53%; b. 02/2020 Cath: LM nl, LAD 20p, 24m, LCX 20ost, OM1/2/3 nl, LPDA nl, LPL1/2 nl, LPAV nl, RCA small, nl.   Obesity    Obstructive sleep apnea    Pulmonary hypertension (HCC)    Sleep apnea    Stroke (HCC)    Systolic dysfunction    a. TTE 10/2016: EF 50%, mild LVH, moderately dilated LA, moderate MR/TR,  mild pulmonary hypertension    Past Surgical History:  Procedure Laterality Date   BOWEL RESECTION  09/11/2019   Procedure: SMALL BOWEL RESECTION;  Surgeon: Carolan Shiver, MD;  Location: [facility] ORS;  Service: General;;   CARDIAC CATHETERIZATION     cataract surgery     COLONOSCOPY WITH PROPOFOL N/A 03/19/2020   Procedure: COLONOSCOPY WITH PROPOFOL;  Surgeon: Wyline Mood, MD;  Location: [facility] ENDOSCOPY;  Service: Gastroenterology;  Laterality: N/A;   CORONARY ANGIOPLASTY      INCISION AND DRAINAGE ABSCESS Right 06/29/2016   Procedure: INCISION AND DRAINAGE ABSCESS;  Surgeon: Lattie Haw, MD;  Location: [facility] ORS;  Service: General;  Laterality: Right;   INCISION AND DRAINAGE OF WOUND Left 06/29/2016   Procedure: IRRIGATION AND DEBRIDEMENT WOUND;  Surgeon: Lattie Haw, MD;  Location: [facility] ORS;  Service: General;  Laterality: Left;   IR ANGIO VERTEBRAL SEL SUBCLAVIAN INNOMINATE UNI R MOD SED  11/20/2020   IR CT HEAD LTD  11/20/2020   IR PERCUTANEOUS ART THROMBECTOMY/INFUSION INTRACRANIAL INC DIAG ANGIO  11/20/2020   LAPAROSCOPIC RIGHT COLECTOMY  09/11/2019   Procedure: RIGHT COLECTOMY;  Surgeon: Carolan Shiver, MD;  Location: [facility] ORS;  Service: General;;   LAPAROSCOPY N/A 09/11/2019   Procedure: LAPAROSCOPY DIAGNOSTIC;  Surgeon: Carolan Shiver, MD;  Location: [facility] ORS;  Service: General;  Laterality: N/A;   LAPAROTOMY N/A 09/13/2019   Procedure: REOPENING OF RECENT LAPAROTOMYANASTOMOSIS OF BOWEL;  Surgeon: Carolan Shiver, MD;  Location: [facility] ORS;  Service: General;  Laterality: N/A;   RADIOLOGY WITH ANESTHESIA N/A 11/20/2020   Procedure: IR WITH ANESTHESIA - CODE STROKE;  Surgeon: Radiologist, Medication, MD;  Location: [facility] OR;  Service: Radiology;  Laterality: N/A;   RIGHT/LEFT HEART CATH AND CORONARY ANGIOGRAPHY Bilateral 02/27/2020   Procedure: RIGHT/LEFT HEART CATH AND CORONARY ANGIOGRAPHY;  Surgeon: Iran Ouch, MD;  Location: [facility] INVASIVE CV LAB;  Service: Cardiovascular;  Laterality: Bilateral;   VISCERAL ANGIOGRAPHY N/A 09/12/2019   Procedure: VISCERAL ANGIOGRAPHY;  Surgeon: Annice Needy, MD;  Location: [facility] INVASIVE CV LAB;  Service: Cardiovascular;  Laterality: N/A;     Current Outpatient Medications  Medication Sig Dispense Refill   Accu-Chek Softclix Lancets lancets Accu-Chek Softclix Lancets  USE AS INSTRUCTED TO CHECK BLOOD SUGARS DAILY 2 HOURS AFTER MEAL     albuterol (VENTOLIN HFA) 108 (90 Base) MCG/ACT inhaler Inhale 2  puffs into the lungs every 4 (four) hours as needed for wheezing or shortness of breath. 18 g 3   ALPRAZolam (XANAX) 0.25 MG tablet TAKE 1 TABLET BY MOUTH TWICE A DAY AS NEEDED FOR ANXIETY 30 tablet 0   diltiazem (CARDIZEM CD) 300 MG 24 hr capsule Take 1 capsule (300 mg total) by mouth daily. 90 capsule 3   ELIQUIS 5 MG TABS tablet TAKE 1 TABLET BY MOUTH TWICE A DAY 60 tablet 5   empagliflozin (JARDIANCE) 10 MG TABS tablet Take 1 tablet (10 mg total) by mouth daily before breakfast. 30 tablet 5   ENTRESTO 24-26 MG TAKE 1 TABLET BY MOUTH TWICE A DAY (Patient taking differently: Take 0.5 tablets by mouth 2 (two) times daily.) 60 tablet 4   EPINEPHrine 0.3 mg/0.3 mL IJ SOAJ injection Inject 0.3 mg into the muscle as needed for anaphylaxis.     fluticasone (FLONASE) 50 MCG/ACT nasal spray Place 1 spray into the nose daily.     furosemide (LASIX) 20 MG tablet Take 20 mg by mouth daily.     glucose blood (ACCU-CHEK GUIDE) test strip Use as instructed  to check blood sugars daily 2 hours after meal DX E11.65 100 each 12   loperamide (IMODIUM) 2 MG capsule Take 4 mg by mouth as needed for diarrhea or loose stools.     methocarbamol (ROBAXIN) 500 MG tablet Take 1 tablet by mouth 3 (three) times daily as needed.     montelukast (SINGULAIR) 10 MG tablet TAKE 1 TABLET BY MOUTH EVERY DAY 90 tablet 1   omeprazole (PRILOSEC) 40 MG capsule TAKE 1 CAPSULE (40 MG TOTAL) BY MOUTH DAILY. 90 capsule 0   predniSONE (DELTASONE) 10 MG tablet Take 10 mg by mouth daily with breakfast.     Probiotic Product (PROBIOTIC-10) CAPS Take 1 capsule by mouth daily.     promethazine (PHENERGAN) 25 MG tablet Take 1 tablet (25 mg total) by mouth every 8 (eight) hours as needed for nausea or vomiting. 20 tablet 0   rosuvastatin (CRESTOR) 20 MG tablet TAKE 1 TABLET BY MOUTH EVERY DAY 90 tablet 0   Semaglutide, 1 MG/DOSE, (OZEMPIC, 1 MG/DOSE,) 2 MG/1.5ML SOPN Inject 1 mg into the skin once a week. 3 mL 0   spironolactone (ALDACTONE) 25 MG  tablet TAKE 1/2 TABLET BY MOUTH EVERY DAY 45 tablet 1   traMADol (ULTRAM) 50 MG tablet Take 50 mg by mouth 4 (four) times daily as needed.     Ipratropium-Albuterol (COMBIVENT IN) Inhale into the lungs 4 (four) times daily as needed. (Patient not taking: Reported on 12/01/2023)     No current facility-administered medications for this visit.    Allergies:   Flecainide, Metoprolol, Propafenone, Rivaroxaban, and Tirzepatide    Social History:  The patient  reports that she has never smoked. She has never used smokeless tobacco. She reports that she does not drink alcohol and does not use drugs.   Family History:  The patient's family history includes COPD in her father; Cancer in her daughter; Dementia in her mother; Heart disease in her brother; Osteoporosis in her mother; Vascular Disease in her mother.    ROS:  Please see the history of present illness.   Otherwise, review of systems are positive for none.   All other systems are reviewed and negative.    PHYSICAL EXAM: VS:  BP (!) 90/40 (BP Location: Right Arm, Patient Position: Sitting, Cuff Size: Large)   Pulse 62   Ht 5\' 4"  (1.626 m)   Wt 239 lb 6 oz (108.6 kg)   LMP  (LMP Unknown)   SpO2 97%   BMI 41.09 kg/m  , BMI Body mass index is 41.09 kg/m. GEN: Well nourished, well developed, in no acute distress  HEENT: normal  Neck: no JVD, carotid bruits, or masses Cardiac: Irregularly irregular ; no murmurs, rubs, or gallops, trace bilateral leg edema worse on the left side Respiratory:  clear to auscultation bilaterally, normal work of breathing GI: soft, nontender, nondistended, + BS MS: no deformity or atrophy  Skin: warm and dry, no rash Neuro:  Strength and sensation are intact Psych: euthymic mood, full affect Radial pulses normal   EKG:  EKG is ordered today. The ekg ordered today demonstrates : Atrial fibrillation Left axis deviation Low voltage QRS Cannot rule out Anteroseptal infarct (cited on or before  03-Jan-2022) ST & T wave abnormality, consider lateral ischemia When compared with ECG of 30-Jun-2023 16:06, Questionable change in initial forces of Anteroseptal leads   Recent Labs: 04/20/2023: B Natriuretic Peptide 201.9; Hemoglobin 13.2; Platelets 233 09/14/2023: ALT 14 11/25/2023: BUN 16; Creatinine, Ser 1.15; Potassium 4.8; Sodium 141  Lipid Panel    Component Value Date/Time   CHOL 122 07/17/2023 1013   CHOL 143 06/12/2021 1028   TRIG 130 07/17/2023 1013   HDL 58 07/17/2023 1013   HDL 73 06/12/2021 1028   CHOLHDL 2.1 07/17/2023 1013   VLDL 26 07/17/2023 1013   LDLCALC 38 07/17/2023 1013   LDLCALC 47 06/12/2021 1028      Wt Readings from Last 3 Encounters:  12/01/23 239 lb 6 oz (108.6 kg)  11/25/23 240 lb (108.9 kg)  10/28/23 243 lb (110.2 kg)         04/03/2017   11:07 AM  PAD Screen  Previous PAD dx? No  Previous surgical procedure? No  Pain with walking? No  Feet/toe relief with dangling? No  Painful, non-healing ulcers? No  Extremities discolored? No      ASSESSMENT AND PLAN:  1.  Nonobstructive coronary artery disease: No chest pain.  Continue medical therapy.  2.  Chronic atrial fibrillation: Ventricular rate is on the low side with intermittent dizziness.  She did not tolerate beta-blockers including metoprolol or bisoprolol due to worsening shortness of breath.    Continue anticoagulation with Eliquis.  I elected to decrease diltiazem extended release to 240 mg once daily.  3. Sleep apnea: Continue treatment with CPAP.  4. Essential hypertension: Blood pressure controlled on current medications.  5.  Chronic systolic heart failure: Most recent echocardiogram showed an EF of 40 to 45%.  Continue Entresto, spironolactone and Jardiance.  She follows with the heart failure clinic on a regular basis.    6.  History of CVA: Embolic in the setting of being off Eliquis.  Any future interruption of Eliquis will require bridging with low molecular weight  heparin.  7.  Hyperlipidemia: Most recent lipid profile showed an LDL of 47.  Continue rosuvastatin 20 mg daily.      Disposition: Continue regular follow-up with the heart failure clinic and follow-up with me on a yearly basis.  Signed,  Lorine Bears, MD  12/01/2023 4:19 PM    [facility]

## 2023-12-02 DIAGNOSIS — M545 Low back pain, unspecified: Secondary | ICD-10-CM | POA: Diagnosis not present

## 2023-12-02 DIAGNOSIS — M25551 Pain in right hip: Secondary | ICD-10-CM | POA: Diagnosis not present

## 2023-12-03 ENCOUNTER — Encounter: Payer: Self-pay | Admitting: Nurse Practitioner

## 2023-12-03 NOTE — Telephone Encounter (Signed)
Spoke with pt and advised her to send message to cardiology also please review send message back to pt

## 2023-12-04 ENCOUNTER — Encounter: Payer: Self-pay | Admitting: Cardiovascular Disease

## 2023-12-08 ENCOUNTER — Other Ambulatory Visit: Payer: Self-pay | Admitting: Cardiovascular Disease

## 2023-12-08 ENCOUNTER — Other Ambulatory Visit: Payer: Self-pay

## 2023-12-08 NOTE — Telephone Encounter (Signed)
Refill request

## 2023-12-09 DIAGNOSIS — M47816 Spondylosis without myelopathy or radiculopathy, lumbar region: Secondary | ICD-10-CM | POA: Diagnosis not present

## 2023-12-09 DIAGNOSIS — S32491A Other specified fracture of right acetabulum, initial encounter for closed fracture: Secondary | ICD-10-CM | POA: Diagnosis not present

## 2023-12-09 DIAGNOSIS — M4856XA Collapsed vertebra, not elsewhere classified, lumbar region, initial encounter for fracture: Secondary | ICD-10-CM | POA: Diagnosis not present

## 2023-12-09 DIAGNOSIS — M545 Low back pain, unspecified: Secondary | ICD-10-CM | POA: Diagnosis not present

## 2023-12-09 DIAGNOSIS — S72051A Unspecified fracture of head of right femur, initial encounter for closed fracture: Secondary | ICD-10-CM | POA: Diagnosis not present

## 2023-12-09 DIAGNOSIS — M24151 Other articular cartilage disorders, right hip: Secondary | ICD-10-CM | POA: Diagnosis not present

## 2023-12-09 DIAGNOSIS — M25551 Pain in right hip: Secondary | ICD-10-CM | POA: Diagnosis not present

## 2023-12-09 DIAGNOSIS — M5126 Other intervertebral disc displacement, lumbar region: Secondary | ICD-10-CM | POA: Diagnosis not present

## 2023-12-09 DIAGNOSIS — M79606 Pain in leg, unspecified: Secondary | ICD-10-CM | POA: Diagnosis not present

## 2023-12-10 ENCOUNTER — Ambulatory Visit: Payer: Medicare Other | Admitting: Gastroenterology

## 2023-12-10 ENCOUNTER — Encounter: Payer: Self-pay | Admitting: Gastroenterology

## 2023-12-10 VITALS — BP 118/80 | HR 69 | Temp 98.3°F | Wt 236.6 lb

## 2023-12-10 DIAGNOSIS — R140 Abdominal distension (gaseous): Secondary | ICD-10-CM

## 2023-12-10 DIAGNOSIS — R110 Nausea: Secondary | ICD-10-CM

## 2023-12-10 DIAGNOSIS — R634 Abnormal weight loss: Secondary | ICD-10-CM

## 2023-12-10 DIAGNOSIS — R630 Anorexia: Secondary | ICD-10-CM | POA: Diagnosis not present

## 2023-12-10 MED ORDER — FAMOTIDINE 40 MG PO TABS: 40.0000 mg | ORAL_TABLET | Freq: Every day | ORAL | 3 refills | Status: DC

## 2023-12-10 NOTE — Progress Notes (Signed)
Wyline Mood MD, MRCP(U.K) [address]  [address]  [place], [place] [address]  Main: [phone]  Fax: [phone]   Primary Care Physician: Sallyanne Kuster, NP  Primary Gastroenterologist:  Dr. Wyline Mood   Chief Complaint  Patient presents with   Diarrhea    HPI: Laura [name] is a 72 y.o. female Summary of history :   Being followed recently since February 2023 for diarrhea felt to be antibiotic induced   She was initially referred and seen on 01/25/2020 for diarrhea.She has previously been a patient of [facility] gastroenterology. She carries a diagnosis of collagenous colitis noted in 2017. Looking back at colon biopsy results from 2013 the biopsy results are not very specific for collagenous colitis. She was hospitalized in November 2020 and underwent resection of the small bowel due to ischemia.  During the surgery she underwent ileum and right colon resection.  Had a second look surgery.She says that prior to the surgery she was having features of constipation alternating with diarrhea.  Since the surgery she has been having only diarrhea up to 10-15 times a day.  At times she has accidents and she has to wear a diaper to prevent soiling her undergarments.  She denies any use of any artificial sugars, NSAIDs, Metformin.  She  transferred  care to our care while  she was on a PPI which has been stopped. Failed treatment with Questran, imodium and CREON   09/18/2019: CT scan of the abdomen shows status post distal small bowel resection with right hemicolectomy and enterocolonic anastomosis.   01/02/2020: Ferritin 25.  B12 and folate normal.  Hemoglobin 10.7 g in January 2021 with an MCV of 85.   01/27/2020: Fecal calprotectin normal, celiac serology negative.  Stool for C. difficile toxin was negative.  GI PCR of her stool showed enteroaggregative E. coli positive.  Commenced on azithromycin.     03/19/2020: Biopsies taken of the terminal ileum as well as colon.  No evidence of  any form of colitis or inflammation.    In November 2021 had a acute right MCA ischemic stroke likely embolic due to atrial fibrillation.  Occurred when she went off her anticoagulation for lumbar injection  02/11/2022: C. difficile negative, GI PCR negative fecal calprotectin positive at 136    Interval history  02/18/2022-12/10/2023   11/25/2023: cr 1.15   She is here today to see me for bloating and decreased appetite.  She says that the symptoms began after starting on semaglutide and got worse when she increase the dose of semaglutide from 0.5 mg to 1 mg dose.  Denies any constipation she uses suffer previously from diarrhea which has resolved.  Denies any vomiting.  Her reflux has worsened.  No dysphagia.      Current Outpatient Medications  Medication Sig Dispense Refill   Accu-Chek Softclix Lancets lancets Accu-Chek Softclix Lancets  USE AS INSTRUCTED TO CHECK BLOOD SUGARS DAILY 2 HOURS AFTER MEAL     albuterol (VENTOLIN HFA) 108 (90 Base) MCG/ACT inhaler Inhale 2 puffs into the lungs every 4 (four) hours as needed for wheezing or shortness of breath. 18 g 3   ALPRAZolam (XANAX) 0.25 MG tablet TAKE 1 TABLET BY MOUTH TWICE A DAY AS NEEDED FOR ANXIETY 30 tablet 0   amoxicillin-clavulanate (AUGMENTIN) 500-125 MG tablet Take 1 tablet by mouth 2 (two) times daily.     azithromycin (ZITHROMAX) 250 MG tablet TAKE 2 TABLETS BY MOUTH TODAY, THEN TAKE 1 TABLET DAILY FOR 4 DAYS  AS DIRECTED     diltiazem (CARDIZEM CD) 240 MG 24 hr capsule Take 1 capsule (240 mg total) by mouth daily. 90 capsule 3   doxycycline (VIBRA-TABS) 100 MG tablet TAKE 1 TABLET (100 MG TOTAL) BY MOUTH 2 (TWO) TIMES DAILY FOR 10 DAYS. TAKE WITH FOOD     ELIQUIS 5 MG TABS tablet TAKE 1 TABLET BY MOUTH TWICE A DAY 60 tablet 5   empagliflozin (JARDIANCE) 10 MG TABS tablet Take 1 tablet (10 mg total) by mouth daily before breakfast. 30 tablet 5   ENTRESTO 24-26 MG TAKE 1 TABLET BY MOUTH TWICE A DAY (Patient taking differently:  Take 0.5 tablets by mouth 2 (two) times daily.) 60 tablet 4   EPINEPHrine 0.3 mg/0.3 mL IJ SOAJ injection Inject 0.3 mg into the muscle as needed for anaphylaxis.     fluconazole (DIFLUCAN) 150 MG tablet PLEASE SEE ATTACHED FOR DETAILED DIRECTIONS     fluticasone (FLONASE) 50 MCG/ACT nasal spray Place 1 spray into the nose daily.     furosemide (LASIX) 20 MG tablet Take 20 mg by mouth daily.     glipiZIDE (GLUCOTROL XL) 2.5 MG 24 hr tablet Take 1 tablet by mouth daily with breakfast.     glucose blood (ACCU-CHEK GUIDE) test strip Use as instructed to check blood sugars daily 2 hours after meal DX E11.65 100 each 12   Ipratropium-Albuterol (COMBIVENT IN) Inhale into the lungs 4 (four) times daily as needed.     levofloxacin (LEVAQUIN) 500 MG tablet TAKE 1 TABLET (500 MG TOTAL) BY MOUTH DAILY FOR 7 DAYS. TAKE WITH FOOD     loperamide (IMODIUM) 2 MG capsule Take 4 mg by mouth as needed for diarrhea or loose stools.     methocarbamol (ROBAXIN) 500 MG tablet Take 1 tablet by mouth 3 (three) times daily as needed.     montelukast (SINGULAIR) 10 MG tablet TAKE 1 TABLET BY MOUTH EVERY DAY 90 tablet 1   omeprazole (PRILOSEC) 40 MG capsule TAKE 1 CAPSULE (40 MG TOTAL) BY MOUTH DAILY. 90 capsule 0   predniSONE (DELTASONE) 10 MG tablet Take 10 mg by mouth daily with breakfast.     Probiotic Product (PROBIOTIC-10) CAPS Take 1 capsule by mouth daily.     promethazine (PHENERGAN) 25 MG tablet Take 1 tablet (25 mg total) by mouth every 8 (eight) hours as needed for nausea or vomiting. 20 tablet 0   rosuvastatin (CRESTOR) 20 MG tablet TAKE 1 TABLET BY MOUTH EVERY DAY 90 tablet 0   Semaglutide, 1 MG/DOSE, (OZEMPIC, 1 MG/DOSE,) 2 MG/1.5ML SOPN Inject 1 mg into the skin once a week. 3 mL 0   spironolactone (ALDACTONE) 25 MG tablet TAKE 1/2 TABLET BY MOUTH EVERY DAY 45 tablet 1   traMADol (ULTRAM) 50 MG tablet Take 50 mg by mouth 4 (four) times daily as needed.     No current facility-administered medications for  this visit.    Allergies as of 12/10/2023 - Review Complete 12/10/2023  Allergen Reaction Noted   Flecainide Shortness Of Breath 11/27/2016   Metoprolol Shortness Of Breath, Swelling, and Other (See Comments) 01/13/2020   Propafenone Shortness Of Breath, Swelling, and Other (See Comments) 12/01/2016   Rivaroxaban Swelling and Other (See Comments) 10/23/2016   Tirzepatide Diarrhea and Nausea And Vomiting 06/18/2023     ROS:  General: Negative for anorexia, weight loss, fever, chills, fatigue, weakness. ENT: Negative for hoarseness, difficulty swallowing , nasal congestion. CV: Negative for chest pain, angina, palpitations, dyspnea on exertion, peripheral edema.  Respiratory: Negative for dyspnea at rest, dyspnea on exertion, cough, sputum, wheezing.  GI: See history of present illness. GU:  Negative for dysuria, hematuria, urinary incontinence, urinary frequency, nocturnal urination.  Endo: Negative for unusual weight change.    Physical Examination:   BP 118/80   Pulse 69   Temp 98.3 F (36.8 C) (Oral)   Wt 236 lb 9.6 oz (107.3 kg)   LMP  (LMP Unknown)   BMI 40.61 kg/m   General: Well-nourished, well-developed in no acute distress.  Eyes: No icterus. Conjunctivae pink. Mouth: Oropharyngeal mucosa moist and pink , no lesions erythema or exudate. Neuro: Alert and oriented x 3.  Grossly intact. Skin: Warm and dry, no jaundice.   Psych: Alert and cooperative, normal mood and affect.   Imaging Studies: No results found.  Assessment and Plan:   Laura [name] is a 71 y.o. y/o female who has had prior history of small bowel resection and prior diarrhea which could be due to bacterial overgrowth syndrome versus bile salt mediated diarrhea.  She comes in to see me today for bloating which has began after she commenced on semaglutide.  As the dose increase the symptoms worsened associated with nausea but no vomiting.  She does not have an appetite she has lost about 30 pounds  of weight due to the medication.  She has also had increased reflux.  Previous diarrhea has resolved and she does not have any constipation.  I believe her symptoms are due to the effects of semaglutide and she is planning to reduce the dose.  I explained to her it can cause gastroparesis retention of food bloating suppression of appetite.  This in turn can cause acid reflux.  Advised her to commence on a gastroparesis diet information of which has been provided, have small meals more often rather than large meals at a time commenced on Pepcid 40 mg at night once a day.  I have avoided PPI due to CKD.  At her next visit depending on how she is doing we can discuss if she would benefit from an upper endoscopy to rule out gastric outlet obstruction.       Dr Wyline Mood  MD,MRCP [facility]) Follow up in 2 to 3 months with Celso Amy

## 2023-12-10 NOTE — Patient Instructions (Signed)
Please stop taking Imodium.  Please start taking Famotidine 40 MG 1 tablet daily at night.  Gastroparesis  Gastroparesis is a condition in which food takes longer than normal to empty from the stomach. This condition is also known as delayed gastric emptying. It is usually a long-term (chronic) condition. There is no cure, but there are treatments and things that you can do at home to help relieve symptoms. Treating the underlying condition that causes gastroparesis can also help relieve symptoms. What are the causes? In many cases, the cause of this condition is not known. Possible causes include: A hormone (endocrine) disorder, such as hypothyroidism or diabetes. A nervous system disease, such as Parkinson's disease or multiple sclerosis. Cancer, infection, or surgery that affects the stomach or vagus nerve. The vagus nerve runs from your chest, through your neck, and to the lower part of your brain. A connective tissue disorder, such as scleroderma. Certain medicines. What increases the risk? You are more likely to develop this condition if: You have certain disorders or diseases. These may include: An endocrine disorder. An eating disorder. Amyloidosis. Scleroderma. Parkinson's disease. Multiple sclerosis. Cancer or infection of the stomach or the vagus nerve. You have had surgery on your stomach or vagus nerve. You take certain medicines. You are female. What are the signs or symptoms? Symptoms of this condition include: Feeling full after eating very little or a loss of appetite. Nausea, vomiting, or heartburn. Bloating of your abdomen. Inconsistent blood sugar (glucose) levels on blood tests. Unexplained weight loss. Acid from the stomach coming up into the esophagus (gastroesophageal reflux). Sudden tightening (spasm) of the stomach, which can be painful. Symptoms may come and go. Some people may not notice any symptoms. How is this diagnosed? This condition is  diagnosed with tests, such as: Tests that check how long it takes food to move through the stomach and intestines. These tests include: Upper gastrointestinal (GI) series. For this test, you drink a liquid that shows up well on X-rays, and then X-rays are taken of your intestines. Gastric emptying scintigraphy. For this test, you eat food that contains a small amount of radioactive material, and then scans are taken. Wireless capsule GI monitoring system. For this test, you swallow a pill (capsule) that records information about how foods and fluid move through your stomach. Gastric manometry. For this test, a tube is passed down your throat and into your stomach to measure electrical and muscular activity. Endoscopy. For this test, a long, thin tube with a camera and light on the end is passed down your throat and into your stomach to check for problems in your stomach lining. Ultrasound. This test uses sound waves to create images of the inside of your body. This can help rule out gallbladder disease or pancreatitis as a cause of your symptoms. How is this treated? There is no cure for this condition, but treatment and home care may relieve symptoms. Treatment may include: Treating the underlying cause. Managing your symptoms by making changes to your diet and exercise habits. Taking medicines to control nausea and vomiting and to stimulate stomach muscles. Getting food through a feeding tube in the hospital. This may be done in severe cases. Having surgery to insert a device called a gastric electrical stimulator into your body. This device helps improve stomach emptying and control nausea and vomiting. Follow these instructions at home: Take over-the-counter and prescription medicines only as told by your health care provider. Follow instructions from your health care provider about eating  or drinking restrictions. Your health care provider may recommend that you: Eat smaller meals more  often. Eat low-fat foods. Eat low-fiber forms of high-fiber foods. For example, eat cooked vegetables instead of raw vegetables. Have only liquid foods instead of solid foods. Liquid foods are easier to digest. Drink enough fluid to keep your urine pale yellow. Exercise as often as told by your health care provider. Keep all follow-up visits. This is important. Contact a health care provider if you: Notice that your symptoms do not improve with treatment. Have new symptoms. Get help right away if you: Have severe pain in your abdomen that does not improve with treatment. Have nausea that is severe or does not go away. Vomit every time you drink fluids. Summary Gastroparesis is a long-term (chronic) condition in which food takes longer than normal to empty from the stomach. Symptoms include nausea, vomiting, heartburn, bloating of your abdomen, and loss of appetite. Eating smaller portions, low-fat foods, and low-fiber forms of high-fiber foods may help you manage your symptoms. Get help right away if you have severe pain in your abdomen. This information is not intended to replace advice given to you by your health care provider. Make sure you discuss any questions you have with your health care provider. Document Revised: 04/16/2020 Document Reviewed: 04/16/2020 Elsevier Patient Education  2024 [employer].

## 2023-12-11 ENCOUNTER — Other Ambulatory Visit: Payer: Self-pay | Admitting: Nurse Practitioner

## 2023-12-11 DIAGNOSIS — Z760 Encounter for issue of repeat prescription: Secondary | ICD-10-CM

## 2023-12-11 NOTE — Telephone Encounter (Signed)
Please review

## 2023-12-14 ENCOUNTER — Ambulatory Visit (INDEPENDENT_AMBULATORY_CARE_PROVIDER_SITE_OTHER): Payer: Medicare Other | Admitting: Nurse Practitioner

## 2023-12-14 ENCOUNTER — Encounter: Payer: Self-pay | Admitting: Nurse Practitioner

## 2023-12-14 VITALS — BP 128/66 | HR 61 | Temp 97.2°F | Resp 16 | Ht 64.0 in | Wt 240.2 lb

## 2023-12-14 DIAGNOSIS — E1159 Type 2 diabetes mellitus with other circulatory complications: Secondary | ICD-10-CM

## 2023-12-14 DIAGNOSIS — J449 Chronic obstructive pulmonary disease, unspecified: Secondary | ICD-10-CM | POA: Diagnosis not present

## 2023-12-14 DIAGNOSIS — I152 Hypertension secondary to endocrine disorders: Secondary | ICD-10-CM

## 2023-12-14 DIAGNOSIS — E119 Type 2 diabetes mellitus without complications: Secondary | ICD-10-CM | POA: Insufficient documentation

## 2023-12-14 DIAGNOSIS — I5022 Chronic systolic (congestive) heart failure: Secondary | ICD-10-CM

## 2023-12-14 LAB — POCT GLYCOSYLATED HEMOGLOBIN (HGB A1C): Hemoglobin A1C: 6 % — AB (ref 4.0–5.6)

## 2023-12-14 MED ORDER — SEMAGLUTIDE(0.25 OR 0.5MG/DOS) 2 MG/3ML ~~LOC~~ SOPN: 0.5000 mg | PEN_INJECTOR | SUBCUTANEOUS | 3 refills | Status: DC
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
## 2023-12-15 DIAGNOSIS — M7061 Trochanteric bursitis, right hip: Secondary | ICD-10-CM | POA: Diagnosis not present

## 2023-12-15 DIAGNOSIS — M533 Sacrococcygeal disorders, not elsewhere classified: Secondary | ICD-10-CM | POA: Diagnosis not present

## 2023-12-24 ENCOUNTER — Other Ambulatory Visit: Payer: Self-pay | Admitting: Family

## 2023-12-25 DIAGNOSIS — M461 Sacroiliitis, not elsewhere classified: Secondary | ICD-10-CM | POA: Diagnosis not present

## 2023-12-25 DIAGNOSIS — E119 Type 2 diabetes mellitus without complications: Secondary | ICD-10-CM | POA: Diagnosis not present

## 2023-12-28 ENCOUNTER — Ambulatory Visit: Payer: Medicare Other | Attending: Family | Admitting: Adult Health

## 2023-12-28 ENCOUNTER — Encounter: Payer: Self-pay | Admitting: Adult Health

## 2023-12-28 VITALS — BP 119/63 | HR 89 | Wt 234.6 lb

## 2023-12-28 DIAGNOSIS — G4733 Obstructive sleep apnea (adult) (pediatric): Secondary | ICD-10-CM

## 2023-12-28 DIAGNOSIS — I482 Chronic atrial fibrillation, unspecified: Secondary | ICD-10-CM | POA: Diagnosis not present

## 2023-12-28 DIAGNOSIS — I428 Other cardiomyopathies: Secondary | ICD-10-CM | POA: Diagnosis not present

## 2023-12-28 DIAGNOSIS — I10 Essential (primary) hypertension: Secondary | ICD-10-CM

## 2023-12-28 DIAGNOSIS — I5022 Chronic systolic (congestive) heart failure: Secondary | ICD-10-CM
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
## 2023-12-28 NOTE — Patient Instructions (Signed)
 No Labs done today.   No medication changes were made. Please continue all current medications as prescribed.  Your physician recommends that you schedule a follow-up appointment in: 3 months with Ellouise Class  If you have any questions or concerns before your next appointment please send us  a message through [place] or call our office at [phone].    TO LEAVE A MESSAGE FOR THE NURSE SELECT OPTION 2, PLEASE LEAVE A MESSAGE INCLUDING: YOUR NAME DATE OF BIRTH CALL BACK NUMBER REASON FOR CALL**this is important as we prioritize the call backs  YOU WILL RECEIVE A CALL BACK THE SAME DAY AS LONG AS YOU CALL BEFORE 4:00 PM   Do the following things EVERYDAY: Weigh yourself in the morning before breakfast. Write it down and keep it in a log. Take your medicines as prescribed Eat low salt foods--Limit salt (sodium) to 2000 mg per day.  Stay as active as you can everyday Limit all fluids for the day to less than 2 liters   At [facility], you and your health needs are our priority. As part of our continuing mission to provide you with exceptional heart care, we have created designated Provider Care Teams. These Care Teams include your primary Cardiologist (physician) and Advanced Practice Providers (APPs- Physician Assistants and Nurse Practitioners) who all work together to provide you with the care you need, when you need it.   You may see any of the following providers on your designated Care Team at your next follow up: Dr Toribio Fuel Dr Ezra Shuck Dr. Ria Gardenia Greig Lenetta, NP Caffie Shed, [place] [facility], [place] Beckey Coe, NP Tinnie Redman, PharmD   Please be sure to bring in all your medications bottles to every appointment.    Thank you for choosing [facility]

## 2023-12-29 ENCOUNTER — Other Ambulatory Visit: Payer: Self-pay | Admitting: Gastroenterology

## 2024-01-04 ENCOUNTER — Ambulatory Visit (INDEPENDENT_AMBULATORY_CARE_PROVIDER_SITE_OTHER): Payer: Medicare Other | Admitting: Vascular Surgery

## 2024-01-04 DIAGNOSIS — M7061 Trochanteric bursitis, right hip: Secondary | ICD-10-CM | POA: Diagnosis not present

## 2024-01-04 DIAGNOSIS — M461 Sacroiliitis, not elsewhere classified: Secondary | ICD-10-CM | POA: Diagnosis not present

## 2024-01-08 DIAGNOSIS — M7061 Trochanteric bursitis, right hip: Secondary | ICD-10-CM | POA: Diagnosis not present

## 2024-01-11 ENCOUNTER — Telehealth: Payer: Self-pay

## 2024-01-11 DIAGNOSIS — M533 Sacrococcygeal disorders, not elsewhere classified: Secondary | ICD-10-CM | POA: Insufficient documentation

## 2024-01-11 DIAGNOSIS — M25551 Pain in right hip: Secondary | ICD-10-CM

## 2024-01-11 HISTORY — DX: Pain in right hip: M25.551

## 2024-01-11 HISTORY — DX: Sacrococcygeal disorders, not elsewhere classified: M53.3

## 2024-01-11 NOTE — Telephone Encounter (Signed)
Patient left a voicemail and states that the pharmacy keeps requesting a refill on the omeprazole and she states the pharmacy is telling her she needs to call the provider office. She states she needs to know if she needs to stop taking it or what she needs to be doing.

## 2024-01-11 NOTE — Telephone Encounter (Signed)
Called patient but had to leave her a voicemail. I will also send her a message through mychart.

## 2024-01-12 ENCOUNTER — Ambulatory Visit: Payer: Medicare Other | Admitting: Podiatry

## 2024-01-13 ENCOUNTER — Telehealth: Payer: Self-pay | Admitting: Pharmacist

## 2024-01-13 ENCOUNTER — Telehealth: Payer: Self-pay | Admitting: Pharmacy Technician

## 2024-01-13 ENCOUNTER — Other Ambulatory Visit (HOSPITAL_COMMUNITY): Payer: Self-pay

## 2024-01-13 NOTE — Telephone Encounter (Signed)
Call pt for Ozempic dose titration. Reports she tolerates 0.5 mg once week well. She has changed her part D insurance so we need to apply for PA again. They will cover 1st 30 days supply without PA approval. Will start PA process. And her pharmacy has 1 mg Ozempic prescription which was previously sent.

## 2024-01-14 ENCOUNTER — Encounter: Payer: Self-pay | Admitting: Cardiovascular Disease

## 2024-01-14 MED ORDER — SEMAGLUTIDE(0.25 OR 0.5MG/DOS) 2 MG/3ML ~~LOC~~ SOPN: 0.5000 mg | PEN_INJECTOR | SUBCUTANEOUS | 0 refills | Status: DC

## 2024-01-15 ENCOUNTER — Other Ambulatory Visit (HOSPITAL_COMMUNITY): Payer: Self-pay

## 2024-01-15 NOTE — Telephone Encounter (Signed)
Pharmacy Patient Advocate Encounter   Received notification from Pt Calls Messages that prior authorization for ozempic is required/requested.   Insurance verification completed.   The patient is insured through [place] .   Per test claim: PA required; PA submitted to above mentioned insurance via CoverMyMeds Key/confirmation #/EOC [id number] Status is pending

## 2024-01-15 NOTE — Telephone Encounter (Signed)
Pharmacy Patient Advocate Encounter  Received notification from [facility] that Prior Authorization for ozempic has been APPROVED from 12/23/23 to 12/21/24. Unable to obtain price due to refill too soon rejection, last fill date 01/10/24 next available fill date02/09/25   PA #/Case ID/Reference #: [id number]

## 2024-01-15 NOTE — Telephone Encounter (Signed)
Patient made aware.

## 2024-01-21 ENCOUNTER — Other Ambulatory Visit: Payer: Self-pay | Admitting: Medical

## 2024-01-29 ENCOUNTER — Other Ambulatory Visit (HOSPITAL_COMMUNITY): Payer: Self-pay

## 2024-01-29 ENCOUNTER — Ambulatory Visit (INDEPENDENT_AMBULATORY_CARE_PROVIDER_SITE_OTHER): Payer: Medicare Other | Admitting: Podiatry

## 2024-01-29 ENCOUNTER — Encounter: Payer: Self-pay | Admitting: Cardiovascular Disease

## 2024-01-29 ENCOUNTER — Encounter: Payer: Self-pay | Admitting: Podiatry

## 2024-01-29 DIAGNOSIS — M79674 Pain in right toe(s): Secondary | ICD-10-CM | POA: Diagnosis not present

## 2024-01-29 DIAGNOSIS — B351 Tinea unguium: Secondary | ICD-10-CM

## 2024-01-29 DIAGNOSIS — M79675 Pain in left toe(s): Secondary | ICD-10-CM | POA: Diagnosis not present

## 2024-01-29 NOTE — Progress Notes (Signed)
 Chief Complaint  Patient presents with   Nail Problem    They're fine I think.  He wants to check them and cut them.    SUBJECTIVE Patient presents to office today complaining of elongated, thickened nails that cause pain while ambulating in shoes.  Patient is unable to trim their own nails. Patient is here for further evaluation and treatment.  Past Medical History:  Diagnosis Date   Asthma    Chronic atrial fibrillation (HCC)    a. diagnosed in 09/2016; b. failed flecainide and propafenone due to LE swelling and SOB, could not afford Multaq; c. CHADS2VASc => 5 (CHF, HTN, age x 1, nonobs CAD, female)--> Eliquis    COPD (chronic obstructive pulmonary disease) (HCC)    GERD (gastroesophageal reflux disease)    HFmrEF (heart failure with mid-range ejection fraction) (HCC)    a. 12/2019 Echo: EF 40-45%. b. 01/2022 Echo: EF 40-45%.   Hyperlipidemia    Hypertension    NICM (nonischemic cardiomyopathy) (HCC)    a. 12/2019 Echo: EF 40-45%, glob HK, mildly reduced RV fxn, sev dil LA. *HR 130 (afib) during study.   Nonobstructive CAD (coronary artery disease)    a. Lexiscan Myoview 10/2016: no evidence of ischemia, EF 53%; b. 02/2020 Cath: LM nl, LAD 20p, 84m, LCX 20ost, OM1/2/3 nl, LPDA nl, LPL1/2 nl, LPAV nl, RCA small, nl.   Obesity    Obstructive sleep apnea    Pulmonary hypertension (HCC)    Sleep apnea    Stroke (HCC)    Systolic dysfunction    a. TTE 10/2016: EF 50%, mild LVH, moderately dilated LA, moderate MR/TR, mild pulmonary hypertension    Allergies  Allergen Reactions   Flecainide Shortness Of Breath   Metoprolol  Shortness Of Breath, Swelling and Other (See Comments)    My limbs swell also   Propafenone Shortness Of Breath, Swelling and Other (See Comments)    My limbs swell also   Rivaroxaban Swelling and Other (See Comments)    Xarelto- My limbs swell Other reaction(s): Muscle Pain, Other (See Comments) Other reaction(s): Other (See Comments) Xarelto- My  limbs swell   Tirzepatide  Diarrhea and Nausea And Vomiting    Severe adverse side effects of mounjaro      OBJECTIVE General Patient is awake, alert, and oriented x 3 and in no acute distress. Derm Skin is dry and supple bilateral. Negative open lesions or macerations. Remaining integument unremarkable. Nails are tender, long, thickened and dystrophic with subungual debris, consistent with onychomycosis, 1-5 bilateral. No signs of infection noted. Vasc  DP and PT pedal pulses palpable bilaterally. Temperature gradient within normal limits.  Neuro Epicritic and protective threshold sensation grossly intact bilaterally.  Musculoskeletal Exam No symptomatic pedal deformities noted bilateral. Muscular strength within normal limits.  ASSESSMENT 1.  Pain due to onychomycosis of toenails both 2.  Ingrown toenail lateral aspect of the right great toe  PLAN OF CARE 1. Patient evaluated today.  2. Instructed to maintain good pedal hygiene and foot care.  3. Mechanical debridement of nails 1-5 bilaterally performed using a nail nipper. Filed with dremel without incident.  Patient felt significant relief of the ingrown 4. Return to clinic in 3 mos.    Thresa EMERSON Sar, DPM [facility]  Dr. Thresa EMERSON Sar, DPM    [address]  [place], [place] [address]                Office [phone]  Fax [phone]

## 2024-02-03 ENCOUNTER — Encounter: Payer: Self-pay | Admitting: Cardiovascular Disease

## 2024-02-03 ENCOUNTER — Other Ambulatory Visit: Payer: Self-pay | Admitting: Nurse Practitioner

## 2024-02-03 DIAGNOSIS — Z760 Encounter for issue of repeat prescription: Secondary | ICD-10-CM

## 2024-02-03 DIAGNOSIS — M5416 Radiculopathy, lumbar region: Secondary | ICD-10-CM | POA: Diagnosis not present

## 2024-02-03 DIAGNOSIS — M48062 Spinal stenosis, lumbar region with neurogenic claudication: Secondary | ICD-10-CM | POA: Diagnosis not present

## 2024-02-03 DIAGNOSIS — M48061 Spinal stenosis, lumbar region without neurogenic claudication: Secondary | ICD-10-CM | POA: Insufficient documentation

## 2024-02-03 HISTORY — DX: Spinal stenosis, lumbar region without neurogenic claudication: M48.061

## 2024-02-04 ENCOUNTER — Telehealth: Payer: Self-pay | Admitting: Cardiovascular Disease

## 2024-02-04 NOTE — Telephone Encounter (Signed)
   Patient Name: Laura [name]  DOB: 01/31/1951 MRN: [id number]  Primary Cardiologist: Lorine Bears, MD  Clinical pharmacists have reviewed the patient's past medical history, labs, and current medications as part of preoperative protocol coverage. The following recommendations have been made:  Per office protocol, patient can hold Eliquis for 3 days prior to procedure   I will route this recommendation to the requesting party via Epic fax function and remove from pre-op pool.  Please call with questions.  Denyce Robert, NP 02/04/2024, 4:00 PM

## 2024-02-04 NOTE — Telephone Encounter (Signed)
   Pre-operative Risk Assessment    Patient Name: Laura [name]  DOB: 1951-09-24 MRN: [id number]   Date of last office visit: unknown Date of next office visit: unknown  Request for Surgical Clearance    Procedure:   lumbar epidural steroid injection  Date of Surgery:  Clearance TBD                                Surgeon:  not indicated Surgeon's Group or Practice Name:  [facility] Phone number:  [phone] Fax number:  [phone]   Type of Clearance Requested:   - Pharmacy:  Hold Apixaban (Eliquis) discontinue 3 days prior and resume 24 hours after procedure   Type of Anesthesia:  None    Additional requests/questions:    Laura [name]   02/04/2024, 8:56 AM

## 2024-02-04 NOTE — Telephone Encounter (Signed)
Last 12/24 and next 4/25

## 2024-02-04 NOTE — Telephone Encounter (Signed)
Patient with diagnosis of afib on Eliquis for anticoagulation.    Procedure:  lumbar epidural steroid injection  Date of procedure: TBD   CHA2DS2-VASc Score = 8  This indicates a 10.8% annual risk of stroke. The patient's score is based upon: CHF History: 1 HTN History: 1 Diabetes History: 1 Stroke History: 2 Vascular Disease History: 1 Age Score: 1 Gender Score: 1       CrCl 73 mL/min Platelet count 287 K   Per office protocol, patient can hold Eliquis for 3 days prior to procedure. 02/12 encounter per Dr.Arida "Any interruption of Eliquis requires bridging with Lovenox."  The procedure is TBD please reach out to PharmD when we have procedure date to get bridging schedule.    **This guidance is not considered finalized until pre-operative APP has relayed final recommendations.**

## 2024-02-05 MED ORDER — ENOXAPARIN SODIUM 100 MG/ML IJ SOSY: 100.0000 mg | PREFILLED_SYRINGE | Freq: Two times a day (BID) | INTRAMUSCULAR | 0 refills | Status: DC

## 2024-02-05 NOTE — Addendum Note (Signed)
Addended by: Tylene Fantasia on: 02/05/2024 04:07 PM   Modules accepted: Orders

## 2024-02-05 NOTE — Telephone Encounter (Addendum)
Per patient's message shot is scheduled for Wednesday Feb 19 (see pt's msg from 02/03/2024)    Bridging schedule discussed with pt  Feb 15 -Take Eliquis twice daily like normal twice daily   Feb 16  - inject Lovenox 100mg  subcutaneously in the fatty tissue of the abdomen at 9 am and 9 pm, No Eliquis  Feb 17 -inject Lovenox 100mg  subcutaneously in the fatty tissue of the abdomen at 9 am and  9 pm, No Eliquis  Feb 18  inject Lovenox 100 mg at 9 am, no PM dose, No Eliquis  Feb 19 - procedure date, no Lovenox, no Eliquis  Feb 20 - Resume Eliquis 5 mg twice daily as per MD's advise

## 2024-02-07 ENCOUNTER — Other Ambulatory Visit: Payer: Self-pay | Admitting: Cardiovascular Disease

## 2024-02-07 DIAGNOSIS — I25118 Atherosclerotic heart disease of native coronary artery with other forms of angina pectoris: Secondary | ICD-10-CM

## 2024-02-07 DIAGNOSIS — Z8673 Personal history of transient ischemic attack (TIA), and cerebral infarction without residual deficits: Secondary | ICD-10-CM

## 2024-02-07 DIAGNOSIS — E785 Hyperlipidemia, unspecified: Secondary | ICD-10-CM

## 2024-02-08 ENCOUNTER — Other Ambulatory Visit: Payer: Self-pay | Admitting: Cardiovascular Disease

## 2024-02-08 DIAGNOSIS — I482 Chronic atrial fibrillation, unspecified: Secondary | ICD-10-CM

## 2024-02-08 NOTE — Telephone Encounter (Signed)
Eliquis 5mg  refill request received. Patient is 73 years old, weight-106.4kg, Crea-1.15 on 11/25/23, Diagnosis-Afib, and last seen by Tonye Becket on 12/28/23. Dose is appropriate based on dosing criteria. Will send in refill to requested pharmacy.

## 2024-02-08 NOTE — Telephone Encounter (Signed)
   Patient Name: Laura [name]  DOB: 07/14/1951 MRN: [id number]  Primary Cardiologist: Lorine Bears, MD  Clinical pharmacists have reviewed the patient's past medical history, labs, and current medications as part of preoperative protocol coverage. The following recommendations have been made:  Addendum 02/08/2024:   Per patient's message shot is scheduled for Wednesday Feb 19 (see pt's msg from 02/03/2024)    Bridging schedule discussed with pt   Feb 15 -Take Eliquis twice daily like normal twice daily    Feb 16  - inject Lovenox 100mg  subcutaneously in the fatty tissue of the abdomen at 9 am and 9 pm, No Eliquis   Feb 17 -inject Lovenox 100mg  subcutaneously in the fatty tissue of the abdomen at 9 am and  9 pm, No Eliquis   Feb 18  inject Lovenox 100 mg at 9 am, no PM dose, No Eliquis   Feb 19 - procedure date, no Lovenox, no Eliquis   Feb 20 - Resume Eliquis 5 mg twice daily as per MD's advise     I will route this recommendation to the requesting party via Epic fax function and remove from pre-op pool.  Please call with questions.  Denyce Robert, NP 02/08/2024, 8:21 AM

## 2024-02-10 DIAGNOSIS — M5416 Radiculopathy, lumbar region: Secondary | ICD-10-CM | POA: Diagnosis not present

## 2024-02-10 DIAGNOSIS — E119 Type 2 diabetes mellitus without complications: Secondary | ICD-10-CM | POA: Diagnosis not present

## 2024-02-15 ENCOUNTER — Encounter: Payer: Self-pay | Admitting: Nurse Practitioner

## 2024-02-26 ENCOUNTER — Encounter: Payer: Self-pay | Admitting: Nurse Practitioner

## 2024-02-26 MED ORDER — PREDNISONE 10 MG (21) PO TBPK: ORAL_TABLET | ORAL | 0 refills | Status: DC

## 2024-02-26 MED ORDER — AZITHROMYCIN 250 MG PO TABS: ORAL_TABLET | ORAL | 0 refills | Status: DC

## 2024-03-01 ENCOUNTER — Inpatient Hospital Stay
Admission: RE | Admit: 2024-03-01 | Discharge: 2024-03-01 | Disposition: A | Discharge status: Home / Self Care | Payer: Self-pay | Source: Ambulatory Visit | Attending: Neurosurgery | Admitting: Neurosurgery

## 2024-03-01 ENCOUNTER — Other Ambulatory Visit: Payer: Self-pay | Admitting: Family Medicine

## 2024-03-01 ENCOUNTER — Encounter: Payer: Self-pay | Admitting: Physician Assistant

## 2024-03-01 DIAGNOSIS — Z049 Encounter for examination and observation for unspecified reason: Secondary | ICD-10-CM

## 2024-03-02 ENCOUNTER — Other Ambulatory Visit: Payer: Self-pay

## 2024-03-02 NOTE — Progress Notes (Unsigned)
 Referring Physician:  Sallyanne Kuster, NP [address] [place],  [place] [address]  Primary Physician:  Sallyanne Kuster, NP  History of Present Illness: 03/03/2024 [name] is here today with a chief complaint of severe pain in her right groin and leg. The pain began after a fall from her recliner in November and has since worsened, leaving her unable to walk. The pain is located in her right groin and extends down the front of her leg to her knee. She describes the pain as so severe that she cannot lift her leg and feels like something is detached in her groin. She has received two shots for the pain, one in her hip and one for sciatica, but neither has provided relief. The hip shot only numbed the pain for two days, and the sciatica shot did not alleviate her symptoms. She has been receiving home health care and uses a chair for mobility.  She has history of a stroke that affected the left side of her body, but has regained substantial mobility until her recent right groin pain.  Bowel/Bladder Dysfunction: none  Conservative measures:  Physical therapy:  has participated in PT at [facility] including regular antiinflammatories: Robaxin, Tramadol  Injections: no epidural steroid injections  Past Surgery: none  Laura [name] has no symptoms of cervical myelopathy.  The symptoms are causing a significant impact on the patient's life.   I have utilized the care everywhere function in epic to review the outside records available from external health systems.  Review of Systems:  A 10 point review of systems is negative, except for the pertinent positives and negatives detailed in the HPI.  Past Medical History: Past Medical History:  Diagnosis Date   Asthma    Chronic atrial fibrillation (HCC)    a. diagnosed in 09/2016; b. failed flecainide and propafenone due to LE swelling and SOB, could not afford Multaq; c. CHADS2VASc => 5 (CHF, HTN,  age x 1, nonobs CAD, female)--> Eliquis   COPD (chronic obstructive pulmonary disease) (HCC)    GERD (gastroesophageal reflux disease)    HFmrEF (heart failure with mid-range ejection fraction) (HCC)    a. 12/2019 Echo: EF 40-45%. b. 01/2022 Echo: EF 40-45%.   Hyperlipidemia    Hypertension    NICM (nonischemic cardiomyopathy) (HCC)    a. 12/2019 Echo: EF 40-45%, glob HK, mildly reduced RV fxn, sev dil LA. *HR 130 (afib) during study.   Nonobstructive CAD (coronary artery disease)    a. Lexiscan Myoview 10/2016: no evidence of ischemia, EF 53%; b. 02/2020 Cath: LM nl, LAD 20p, 65m, LCX 20ost, OM1/2/3 nl, LPDA nl, LPL1/2 nl, LPAV nl, RCA small, nl.   Obesity    Obstructive sleep apnea    Pain in joint of right hip 01/11/2024   Pain of right sacroiliac joint 01/11/2024   Pulmonary hypertension (HCC)    Sleep apnea    Spinal stenosis of lumbar region 02/03/2024   Stroke (HCC)    Systolic dysfunction    a. TTE 10/2016: EF 50%, mild LVH, moderately dilated LA, moderate MR/TR, mild pulmonary hypertension    Past Surgical History: Past Surgical History:  Procedure Laterality Date   BOWEL RESECTION  09/11/2019   Procedure: SMALL BOWEL RESECTION;  Surgeon: Carolan Shiver, MD;  Location: [facility];  Service: General;;   CARDIAC CATHETERIZATION     cataract surgery     COLONOSCOPY WITH PROPOFOL N/A 03/19/2020   Procedure: COLONOSCOPY WITH PROPOFOL;  Surgeon: Wyline Mood,  MD;  Location: [facility];  Service: Gastroenterology;  Laterality: N/A;   CORONARY ANGIOPLASTY     INCISION AND DRAINAGE ABSCESS Right 06/29/2016   Procedure: INCISION AND DRAINAGE ABSCESS;  Surgeon: Lattie Haw, MD;  Location: [facility];  Service: General;  Laterality: Right;   INCISION AND DRAINAGE OF WOUND Left 06/29/2016   Procedure: IRRIGATION AND DEBRIDEMENT WOUND;  Surgeon: Lattie Haw, MD;  Location: [facility];  Service: General;  Laterality: Left;   IR ANGIO VERTEBRAL SEL SUBCLAVIAN INNOMINATE UNI R MOD  SED  11/20/2020   IR CT HEAD LTD  11/20/2020   IR PERCUTANEOUS ART THROMBECTOMY/INFUSION INTRACRANIAL INC DIAG ANGIO  11/20/2020   LAPAROSCOPIC RIGHT COLECTOMY  09/11/2019   Procedure: RIGHT COLECTOMY;  Surgeon: Carolan Shiver, MD;  Location: [facility];  Service: General;;   LAPAROSCOPY N/A 09/11/2019   Procedure: LAPAROSCOPY DIAGNOSTIC;  Surgeon: Carolan Shiver, MD;  Location: [facility];  Service: General;  Laterality: N/A;   LAPAROTOMY N/A 09/13/2019   Procedure: REOPENING OF RECENT LAPAROTOMYANASTOMOSIS OF BOWEL;  Surgeon: Carolan Shiver, MD;  Location: [facility];  Service: General;  Laterality: N/A;   RADIOLOGY WITH ANESTHESIA N/A 11/20/2020   Procedure: IR WITH ANESTHESIA - CODE STROKE;  Surgeon: Radiologist, Medication, MD;  Location: [facility];  Service: Radiology;  Laterality: N/A;   RIGHT/LEFT HEART CATH AND CORONARY ANGIOGRAPHY Bilateral 02/27/2020   Procedure: RIGHT/LEFT HEART CATH AND CORONARY ANGIOGRAPHY;  Surgeon: Iran Ouch, MD;  Location: [facility];  Service: Cardiovascular;  Laterality: Bilateral;   VISCERAL ANGIOGRAPHY N/A 09/12/2019   Procedure: VISCERAL ANGIOGRAPHY;  Surgeon: Annice Needy, MD;  Location: [facility];  Service: Cardiovascular;  Laterality: N/A;    Allergies: Allergies as of 03/03/2024 - Review Complete 03/03/2024  Allergen Reaction Noted   Flecainide Shortness Of Breath 11/27/2016   Metoprolol Shortness Of Breath, Swelling, and Other (See Comments) 01/13/2020   Propafenone Shortness Of Breath, Swelling, and Other (See Comments) 12/01/2016   Rivaroxaban Swelling and Other (See Comments) 10/23/2016   Tirzepatide Diarrhea and Nausea And Vomiting 06/18/2023    Medications:  Current Outpatient Medications:    Accu-Chek Softclix Lancets lancets, Accu-Chek Softclix Lancets  USE AS INSTRUCTED TO CHECK BLOOD SUGARS DAILY 2 HOURS AFTER MEAL, Disp: , Rfl:    albuterol (VENTOLIN HFA) 108 (90 Base) MCG/ACT inhaler, Inhale 2  puffs into the lungs every 4 (four) hours as needed for wheezing or shortness of breath., Disp: 18 g, Rfl: 3   ALPRAZolam (XANAX) 0.25 MG tablet, TAKE 1 TABLET BY MOUTH TWICE A DAY AS NEEDED FOR ANXIETY, Disp: 30 tablet, Rfl: 0   diltiazem (CARDIZEM CD) 240 MG 24 hr capsule, Take 1 capsule (240 mg total) by mouth daily., Disp: 90 capsule, Rfl: 3   ELIQUIS 5 MG TABS tablet, TAKE 1 TABLET BY MOUTH TWICE A DAY, Disp: 60 tablet, Rfl: 5   ENTRESTO 24-26 MG, TAKE 1 TABLET BY MOUTH TWICE A DAY (Patient taking differently: Take 0.5 tablets by mouth 2 (two) times daily.), Disp: 60 tablet, Rfl: 4   EPINEPHrine 0.3 mg/0.3 mL IJ SOAJ injection, Inject 0.3 mg into the muscle as needed for anaphylaxis., Disp: , Rfl:    fluticasone (FLONASE) 50 MCG/ACT nasal spray, Place 1 spray into the nose daily., Disp: , Rfl:    furosemide (LASIX) 20 MG tablet, Take 20 mg by mouth as needed for edema., Disp: , Rfl:    glucose blood (ACCU-CHEK GUIDE) test strip, Use as instructed to check blood sugars daily  2 hours after meal DX E11.65, Disp: 100 each, Rfl: 12   JARDIANCE 10 MG TABS tablet, TAKE 1 TABLET BY MOUTH DAILY BEFORE BREAKFAST., Disp: 30 tablet, Rfl: 5   loperamide (IMODIUM) 2 MG capsule, Take 4 mg by mouth as needed for diarrhea or loose stools., Disp: , Rfl:    montelukast (SINGULAIR) 10 MG tablet, TAKE 1 TABLET BY MOUTH EVERY DAY, Disp: 90 tablet, Rfl: 1   Probiotic Product (PROBIOTIC-10) CAPS, Take 1 capsule by mouth daily., Disp: , Rfl:    promethazine (PHENERGAN) 25 MG tablet, Take 1 tablet (25 mg total) by mouth every 8 (eight) hours as needed for nausea or vomiting., Disp: 20 tablet, Rfl: 0   rosuvastatin (CRESTOR) 20 MG tablet, TAKE 1 TABLET BY MOUTH EVERY DAY, Disp: 90 tablet, Rfl: 2   Semaglutide,0.25 or 0.5MG /DOS, 2 MG/3ML SOPN, Inject 0.5 mg into the skin once a week. Not ready to up the dose to 1 mg, disregard prescription for 1 mg that was sent yesterday., Disp: 3 mL, Rfl: 0   spironolactone (ALDACTONE)  25 MG tablet, TAKE 1/2 TABLET BY MOUTH EVERY DAY, Disp: 45 tablet, Rfl: 1   traMADol (ULTRAM) 50 MG tablet, Take 50 mg by mouth 4 (four) times daily as needed., Disp: , Rfl:   Social History: Social History   Tobacco Use   Smoking status: Never   Smokeless tobacco: Never  Vaping Use   Vaping status: Never Used  Substance Use Topics   Alcohol use: No   Drug use: No    Family Medical History: Family History  Problem Relation Age of Onset   Dementia Mother    Osteoporosis Mother    Vascular Disease Mother    COPD Father    Heart disease Brother    Cancer Daughter     Physical Examination: Vitals:   03/03/24 1521  BP: 120/80    General: Patient is in no apparent distress. Attention to examination is appropriate.  Neck:   Supple.  Full range of motion.  Respiratory: Patient is breathing without any difficulty.   NEUROLOGICAL:     Awake, alert, oriented to person, place, and time.  Speech is clear and fluent.   Cranial Nerves: Pupils equal round and reactive to light.  Facial tone is symmetric.  Facial sensation is symmetric. Shoulder shrug is symmetric. Tongue protrusion is midline.  There is no pronator drift.  Strength: Side Biceps Triceps Deltoid Interossei Grip Wrist Ext. Wrist Flex.  R 5 5 5 5 5 5 5   L 5 5 2 5 5 5 5    Side Iliopsoas Quads Hamstring PF DF EHL  R 5 5 5 5 5 5   L 4+ 4+ 4+ 4+ 4+ 4+   Reflexes are 1+ and symmetric at the biceps, triceps, brachioradialis, patella and achilles.   Hoffman's is absent.   Bilateral upper and lower extremity sensation is intact to light touch.    No evidence of dysmetria noted.  Gait is untested.  FABER sign is positive on the right.     Medical Decision Making  Imaging: MRI lumbar spine on December 09, 2023 shows moderate and severe spondylosis causing stenosis at L3-4, L2-3, and L4-5.  There is moderate stenosis at L2-3, severe stenosis at L3-4, and severe stenosis at L4-5 with a grade 1 spondylolisthesis.  I  have personally reviewed the images and agree with the above interpretation.  Assessment and Plan: Ms. Chojnowski is a pleasant 73 y.o. female with right leg pain that could be  multifactorial.  She has some suggestions of possible hip pathology and has been a significant finding on her hip MRI.  She also has severe lumbar stenosis and could have components of neurogenic claudication and/or lumbar radiculopathy.  Right hip and groin pain Chronic pain exacerbated by fall, affecting ambulation. MRI suggests hip pathology over lumbar spine issues. Previous hip injection provided temporary relief.  Low back I am doing fine thanks thanks for following you can I just calling [facility] - Consult Dr. Odis Luster for hip re-evaluation. - Discontinue back injection referrals.   Lumbar spine abnormalities MRI shows disc herniation and nerve root compression. Lumbar pathology may contribute to symptoms but not primary groin pain source. - Consider lumbar spine surgery if hip is not primary issue and symptoms persist.  I spent a total of 30 minutes in this patient's care today. This time was spent reviewing pertinent records including imaging studies, obtaining and confirming history, performing a directed evaluation, formulating and discussing my recommendations, and documenting the visit within the medical record.   Thank you for involving me in the care of this patient.      Elner Seifert K. Myer Haff MD, [facility] Neurosurgery

## 2024-03-03 ENCOUNTER — Ambulatory Visit (INDEPENDENT_AMBULATORY_CARE_PROVIDER_SITE_OTHER): Admitting: Neurosurgery

## 2024-03-03 ENCOUNTER — Encounter: Payer: Self-pay | Admitting: Neurosurgery

## 2024-03-03 VITALS — BP 120/80 | Ht 64.0 in | Wt 234.0 lb

## 2024-03-03 DIAGNOSIS — M48062 Spinal stenosis, lumbar region with neurogenic claudication: Secondary | ICD-10-CM

## 2024-03-03 DIAGNOSIS — M5126 Other intervertebral disc displacement, lumbar region: Secondary | ICD-10-CM | POA: Diagnosis not present

## 2024-03-03 DIAGNOSIS — R1031 Right lower quadrant pain: Secondary | ICD-10-CM | POA: Diagnosis not present

## 2024-03-03 DIAGNOSIS — M25551 Pain in right hip: Secondary | ICD-10-CM | POA: Diagnosis not present

## 2024-03-03 DIAGNOSIS — M48061 Spinal stenosis, lumbar region without neurogenic claudication: Secondary | ICD-10-CM | POA: Diagnosis not present

## 2024-03-03 NOTE — Telephone Encounter (Signed)
 A appointment has already been made or do you want me to schedule her for a sooner appointment.

## 2024-03-04 ENCOUNTER — Ambulatory Visit: Payer: Medicare Other | Admitting: Physician Assistant

## 2024-03-08 ENCOUNTER — Telehealth: Payer: Self-pay | Admitting: Nurse Practitioner

## 2024-03-08 ENCOUNTER — Encounter (INDEPENDENT_AMBULATORY_CARE_PROVIDER_SITE_OTHER): Payer: Self-pay

## 2024-03-08 ENCOUNTER — Other Ambulatory Visit: Payer: Self-pay | Admitting: Internal Medicine

## 2024-03-08 NOTE — Telephone Encounter (Signed)
 Received neb order from [facility]. Gave to DFK-Toni

## 2024-03-10 ENCOUNTER — Encounter: Payer: Self-pay | Admitting: Cardiovascular Disease

## 2024-03-11 DIAGNOSIS — M1611 Unilateral primary osteoarthritis, right hip: Secondary | ICD-10-CM | POA: Diagnosis not present

## 2024-03-16 DIAGNOSIS — M25551 Pain in right hip: Secondary | ICD-10-CM | POA: Diagnosis not present

## 2024-03-17 ENCOUNTER — Other Ambulatory Visit: Payer: Self-pay | Admitting: Nurse Practitioner

## 2024-03-17 DIAGNOSIS — Z760 Encounter for issue of repeat prescription: Secondary | ICD-10-CM

## 2024-03-18 ENCOUNTER — Telehealth: Payer: Self-pay | Admitting: Cardiology

## 2024-03-18 NOTE — Telephone Encounter (Incomplete)
 Called to confirm/remind patient of their appointment at the [facility] on 03/22/24***.   Appointment:   [x] Confirmed  [] Left mess   [] No answer/No voice mail  [] Phone not in service  Patient reminded to bring all medications and/or complete list.  Confirmed patient has transportation. Gave directions, instructed to utilize valet parking.

## 2024-03-22 ENCOUNTER — Ambulatory Visit (HOSPITAL_BASED_OUTPATIENT_CLINIC_OR_DEPARTMENT_OTHER): Admitting: Cardiology

## 2024-03-22 ENCOUNTER — Encounter: Payer: Self-pay | Admitting: Nurse Practitioner

## 2024-03-22 ENCOUNTER — Other Ambulatory Visit
Admission: RE | Admit: 2024-03-22 | Discharge: 2024-03-22 | Disposition: A | Discharge status: Home / Self Care | Source: Ambulatory Visit | Attending: Cardiology | Admitting: Cardiology

## 2024-03-22 ENCOUNTER — Telehealth: Payer: Self-pay | Admitting: Nurse Practitioner

## 2024-03-22 ENCOUNTER — Telehealth: Payer: Self-pay | Admitting: Internal Medicine

## 2024-03-22 VITALS — BP 129/76 | HR 65 | Wt 220.0 lb

## 2024-03-22 DIAGNOSIS — I110 Hypertensive heart disease with heart failure: Secondary | ICD-10-CM | POA: Diagnosis not present

## 2024-03-22 DIAGNOSIS — I428 Other cardiomyopathies: Secondary | ICD-10-CM | POA: Diagnosis not present

## 2024-03-22 DIAGNOSIS — J4489 Other specified chronic obstructive pulmonary disease: Secondary | ICD-10-CM | POA: Diagnosis not present

## 2024-03-22 DIAGNOSIS — I2510 Atherosclerotic heart disease of native coronary artery without angina pectoris: Secondary | ICD-10-CM | POA: Insufficient documentation

## 2024-03-22 DIAGNOSIS — I4821 Permanent atrial fibrillation: Secondary | ICD-10-CM | POA: Insufficient documentation

## 2024-03-22 DIAGNOSIS — Z6837 Body mass index (BMI) 37.0-37.9, adult: Secondary | ICD-10-CM | POA: Insufficient documentation

## 2024-03-22 DIAGNOSIS — Z8249 Family history of ischemic heart disease and other diseases of the circulatory system: Secondary | ICD-10-CM | POA: Insufficient documentation

## 2024-03-22 DIAGNOSIS — G4730 Sleep apnea, unspecified: Secondary | ICD-10-CM | POA: Insufficient documentation

## 2024-03-22 DIAGNOSIS — Z79899 Other long term (current) drug therapy: Secondary | ICD-10-CM | POA: Diagnosis not present

## 2024-03-22 DIAGNOSIS — Q211 Atrial septal defect, unspecified: Secondary | ICD-10-CM | POA: Insufficient documentation

## 2024-03-22 DIAGNOSIS — R300 Dysuria: Secondary | ICD-10-CM | POA: Diagnosis not present

## 2024-03-22 DIAGNOSIS — M25551 Pain in right hip: Secondary | ICD-10-CM | POA: Diagnosis not present

## 2024-03-22 DIAGNOSIS — Z7901 Long term (current) use of anticoagulants: Secondary | ICD-10-CM | POA: Diagnosis not present

## 2024-03-22 DIAGNOSIS — Z8616 Personal history of COVID-19: Secondary | ICD-10-CM | POA: Diagnosis not present

## 2024-03-22 DIAGNOSIS — G4733 Obstructive sleep apnea (adult) (pediatric): Secondary | ICD-10-CM | POA: Diagnosis not present

## 2024-03-22 DIAGNOSIS — E669 Obesity, unspecified: Secondary | ICD-10-CM | POA: Insufficient documentation

## 2024-03-22 DIAGNOSIS — I5022 Chronic systolic (congestive) heart failure: Secondary | ICD-10-CM

## 2024-03-22 DIAGNOSIS — Z8673 Personal history of transient ischemic attack (TIA), and cerebral infarction without residual deficits: Secondary | ICD-10-CM | POA: Insufficient documentation

## 2024-03-22 LAB — CBC
HCT: 41.6 % (ref 36.0–46.0)
Hemoglobin: 14.3 g/dL (ref 12.0–15.0)
MCH: 33 pg (ref 26.0–34.0)
MCHC: 34.4 g/dL (ref 30.0–36.0)
MCV: 96.1 fL (ref 80.0–100.0)
Platelets: 240 10*3/uL (ref 150–400)
RBC: 4.33 MIL/uL (ref 3.87–5.11)
RDW: 15.2 % (ref 11.5–15.5)
WBC: 9.5 10*3/uL (ref 4.0–10.5)
nRBC: 0 % (ref 0.0–0.2)

## 2024-03-22 LAB — BASIC METABOLIC PANEL WITH GFR
Anion gap: 9 (ref 5–15)
BUN: 32 mg/dL — ABNORMAL HIGH (ref 8–23)
CO2: 24 mmol/L (ref 22–32)
Calcium: 9.3 mg/dL (ref 8.9–10.3)
Chloride: 104 mmol/L (ref 98–111)
Creatinine, Ser: 0.75 mg/dL (ref 0.44–1.00)
GFR, Estimated: >60 mL/min (ref >60)
Glucose, Bld: 160 mg/dL — ABNORMAL HIGH (ref 70–99)
Potassium: 4.8 mmol/L (ref 3.5–5.1)
Sodium: 137 mmol/L (ref 135–145)

## 2024-03-22 LAB — URINALYSIS, ROUTINE W REFLEX MICROSCOPIC
Bacteria, UA: NONE SEEN
Bilirubin Urine: NEGATIVE
Glucose, UA: >=500 mg/dL — AB
Ketones, ur: NEGATIVE mg/dL
Leukocytes,Ua: NEGATIVE
Nitrite: NEGATIVE
Protein, ur: NEGATIVE mg/dL
Specific Gravity, Urine: 1.026 (ref 1.005–1.030)
pH: 5 (ref 5.0–8.0)

## 2024-03-22 LAB — LIPID PANEL
Cholesterol: 121 mg/dL (ref 0–200)
HDL: 59 mg/dL (ref >40)
LDL Cholesterol: 36 mg/dL (ref 0–99)
Total CHOL/HDL Ratio: 2.1 ratio
Triglycerides: 132 mg/dL (ref <150)
VLDL: 26 mg/dL (ref 0–40)

## 2024-03-22 LAB — TSH: TSH: 0.786 u[IU]/mL (ref 0.350–4.500)

## 2024-03-22 LAB — BRAIN NATRIURETIC PEPTIDE: B Natriuretic Peptide: 109.1 pg/mL — ABNORMAL HIGH (ref 0.0–100.0)

## 2024-03-22 MED ORDER — DILTIAZEM HCL ER COATED BEADS 120 MG PO CP24: 120.0000 mg | ORAL_CAPSULE | Freq: Every day | ORAL | 3 refills | Status: DC

## 2024-03-22 NOTE — Telephone Encounter (Signed)
 Spoke with pt

## 2024-03-22 NOTE — Telephone Encounter (Signed)
 neb order signed. Faxed back to [facility]; [phone]. Scanned-Toni

## 2024-03-22 NOTE — Telephone Encounter (Signed)
error 

## 2024-03-22 NOTE — Patient Instructions (Signed)
 Medication Changes:  DECREASE YOUR DILTIAZEM TO 120 MG ONCE DAILY.  Lab Work:  Go over to the [facility]. Go pass the gift shop and have your blood work completed.  We will only call you if the results are abnormal or if the provider would like to make medication changes.   Testing/Procedures:  Please have your echo completed. You will check in for this at the [facility]. You have to arrive 15 MINS EARLY for preparation, otherwise you will have to reschedule.   Follow-Up in: 1 MONTH WITH DR. Shirlee Latch.  At the [facility], you and your health needs are our priority. We have a designated team specialized in the treatment of Heart Failure. This Care Team includes your primary Heart Failure Specialized Cardiologist (physician), Advanced Practice Providers (APPs- Physician Assistants and Nurse Practitioners), and Pharmacist who all work together to provide you with the care you need, when you need it.   You may see any of the following providers on your designated Care Team at your next follow up:  Dr. Arvilla Meres Dr. Marca Ancona Dr. Dorthula Nettles Dr. Theresia Bough Clarisa Kindred, FNP Enos Fling, RPH-CPP  Please be sure to bring in all your medications bottles to every appointment.   Need to Contact us:  If you have any questions or concerns before your next appointment please send [place] a message through [place] or call our office at [phone].    TO LEAVE A MESSAGE FOR THE NURSE SELECT OPTION 2, PLEASE LEAVE A MESSAGE INCLUDING: YOUR NAME DATE OF BIRTH CALL BACK NUMBER REASON FOR CALL**this is important as we prioritize the call backs  YOU WILL RECEIVE A CALL BACK THE SAME DAY AS LONG AS YOU CALL BEFORE 4:00 PM

## 2024-03-22 NOTE — Telephone Encounter (Signed)
Lmom to pt
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
## 2024-03-25 ENCOUNTER — Other Ambulatory Visit: Payer: Self-pay | Admitting: Nurse Practitioner

## 2024-03-25 DIAGNOSIS — Z760 Encounter for issue of repeat prescription: Secondary | ICD-10-CM

## 2024-03-28 ENCOUNTER — Telehealth: Payer: Self-pay | Admitting: Pharmacist

## 2024-03-28 MED ORDER — OZEMPIC (1 MG/DOSE) 4 MG/3ML ~~LOC~~ SOPN: 1.0000 mg | PEN_INJECTOR | SUBCUTANEOUS | 0 refills | Status: DC

## 2024-03-28 NOTE — Telephone Encounter (Signed)
 Responded to patient message regarding medication refill. Discussed with patient via telephone. Ozempic 1 mg weekly sent to [facility].

## 2024-03-28 NOTE — Telephone Encounter (Signed)
 Next 04/11/2024

## 2024-03-29 ENCOUNTER — Encounter: Payer: Self-pay | Admitting: Nurse Practitioner

## 2024-03-29 ENCOUNTER — Encounter: Payer: Self-pay | Admitting: Cardiology

## 2024-03-30 ENCOUNTER — Encounter: Payer: Medicare Other | Admitting: Family

## 2024-03-30 DIAGNOSIS — M48062 Spinal stenosis, lumbar region with neurogenic claudication: Secondary | ICD-10-CM | POA: Diagnosis not present

## 2024-03-30 DIAGNOSIS — M5416 Radiculopathy, lumbar region: Secondary | ICD-10-CM | POA: Diagnosis not present

## 2024-03-31 ENCOUNTER — Other Ambulatory Visit: Payer: Self-pay | Admitting: Cardiovascular Disease

## 2024-03-31 DIAGNOSIS — I5022 Chronic systolic (congestive) heart failure: Secondary | ICD-10-CM

## 2024-04-01 ENCOUNTER — Encounter: Payer: Self-pay | Admitting: Nurse Practitioner

## 2024-04-01 ENCOUNTER — Telehealth: Admitting: Nurse Practitioner

## 2024-04-01 VITALS — Resp 16 | Ht 64.0 in | Wt 222.0 lb

## 2024-04-01 DIAGNOSIS — L03116 Cellulitis of left lower limb: Secondary | ICD-10-CM

## 2024-04-01 DIAGNOSIS — Z993 Dependence on wheelchair: Secondary | ICD-10-CM

## 2024-04-01 DIAGNOSIS — R531 Weakness: Secondary | ICD-10-CM | POA: Diagnosis not present

## 2024-04-01 DIAGNOSIS — R262 Difficulty in walking, not elsewhere classified: Secondary | ICD-10-CM

## 2024-04-01 DIAGNOSIS — I890 Lymphedema, not elsewhere classified: Secondary | ICD-10-CM | POA: Diagnosis not present

## 2024-04-01 MED ORDER — FAMOTIDINE 40 MG PO TABS: 40.0000 mg | ORAL_TABLET | Freq: Every day | ORAL | Status: AC

## 2024-04-01 MED ORDER — DOXYCYCLINE HYCLATE 100 MG PO TABS: 100.0000 mg | ORAL_TABLET | Freq: Two times a day (BID) | ORAL | 0 refills | Status: AC
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
## 2024-04-05 ENCOUNTER — Encounter: Payer: Self-pay | Admitting: Cardiovascular Disease

## 2024-04-05 ENCOUNTER — Telehealth: Payer: Self-pay

## 2024-04-05 DIAGNOSIS — I5022 Chronic systolic (congestive) heart failure: Secondary | ICD-10-CM

## 2024-04-05 MED ORDER — ENTRESTO 24-26 MG PO TABS: 1.0000 | ORAL_TABLET | Freq: Two times a day (BID) | ORAL | 2 refills | Status: DC

## 2024-04-05 NOTE — Telephone Encounter (Signed)
 Spoke with [facility] for home health referral waiting for response

## 2024-04-05 NOTE — Telephone Encounter (Signed)
 Send message to [facility] for physical therapy

## 2024-04-06 ENCOUNTER — Telehealth: Payer: Self-pay

## 2024-04-06 NOTE — Telephone Encounter (Signed)
 Laura [name] from [facility] cindy [phone]  called that pt was of town so they unable to schedule appt until next Tuesday for physical therapy

## 2024-04-07 ENCOUNTER — Other Ambulatory Visit: Payer: Self-pay

## 2024-04-08 NOTE — Telephone Encounter (Signed)
 Called and spoke with pt regarding message. Pt stated there was miscommunication and to disregard her message. Also asked pt about sx. Pt states her sx are Pulmonology related and reached out to Pulmonology and has an upcoming appt. Asked pt if she would still like to come to evaluated just in case. Pt states she couldn't because she was watching her granddaughter and would f/u with Pulmonology.

## 2024-04-11 ENCOUNTER — Ambulatory Visit: Payer: Medicare Other | Admitting: Nurse Practitioner

## 2024-04-11 ENCOUNTER — Telehealth: Payer: Self-pay | Admitting: Nurse Practitioner

## 2024-04-11 ENCOUNTER — Encounter: Payer: Self-pay | Admitting: Nurse Practitioner

## 2024-04-11 NOTE — Telephone Encounter (Signed)
 Received Albuteraol order from [facility]. Gave to Alyssa for signature-Toni

## 2024-04-12 ENCOUNTER — Telehealth: Payer: Self-pay | Admitting: Nurse Practitioner

## 2024-04-12 DIAGNOSIS — Z8673 Personal history of transient ischemic attack (TIA), and cerebral infarction without residual deficits: Secondary | ICD-10-CM | POA: Diagnosis not present

## 2024-04-12 DIAGNOSIS — I130 Hypertensive heart and chronic kidney disease with heart failure and stage 1 through stage 4 chronic kidney disease, or unspecified chronic kidney disease: Secondary | ICD-10-CM | POA: Diagnosis not present

## 2024-04-12 DIAGNOSIS — I5022 Chronic systolic (congestive) heart failure: Secondary | ICD-10-CM | POA: Diagnosis not present

## 2024-04-12 DIAGNOSIS — S80212A Abrasion, left knee, initial encounter: Secondary | ICD-10-CM | POA: Diagnosis not present

## 2024-04-12 DIAGNOSIS — J449 Chronic obstructive pulmonary disease, unspecified: Secondary | ICD-10-CM | POA: Diagnosis not present

## 2024-04-12 DIAGNOSIS — E785 Hyperlipidemia, unspecified: Secondary | ICD-10-CM | POA: Diagnosis not present

## 2024-04-12 DIAGNOSIS — E1122 Type 2 diabetes mellitus with diabetic chronic kidney disease: Secondary | ICD-10-CM | POA: Diagnosis not present

## 2024-04-12 DIAGNOSIS — G4733 Obstructive sleep apnea (adult) (pediatric): Secondary | ICD-10-CM | POA: Diagnosis not present

## 2024-04-12 DIAGNOSIS — I5030 Unspecified diastolic (congestive) heart failure: Secondary | ICD-10-CM | POA: Diagnosis not present

## 2024-04-12 DIAGNOSIS — N183 Chronic kidney disease, stage 3 unspecified: Secondary | ICD-10-CM | POA: Diagnosis not present

## 2024-04-12 DIAGNOSIS — I2510 Atherosclerotic heart disease of native coronary artery without angina pectoris: Secondary | ICD-10-CM | POA: Diagnosis not present

## 2024-04-12 DIAGNOSIS — Z7901 Long term (current) use of anticoagulants: Secondary | ICD-10-CM | POA: Diagnosis not present

## 2024-04-12 NOTE — Telephone Encounter (Signed)
 Albuterol  order signed. Faxed back to [place]; [phone]. Scanned-Toni

## 2024-04-13 ENCOUNTER — Telehealth: Payer: Self-pay

## 2024-04-13 NOTE — Telephone Encounter (Signed)
 Angie from [facility] called stating patient wants to move the start date of physical therapy to next Monday as she didn't come home til late from ER.Verbal orders were given.  [phone]

## 2024-04-14 ENCOUNTER — Ambulatory Visit (INDEPENDENT_AMBULATORY_CARE_PROVIDER_SITE_OTHER): Admitting: Gastroenterology

## 2024-04-14 VITALS — BP 113/65 | HR 54 | Temp 97.8°F | Wt 218.0 lb

## 2024-04-14 DIAGNOSIS — Z09 Encounter for follow-up examination after completed treatment for conditions other than malignant neoplasm: Secondary | ICD-10-CM

## 2024-04-14 DIAGNOSIS — R140 Abdominal distension (gaseous): Secondary | ICD-10-CM

## 2024-04-14 NOTE — Progress Notes (Signed)
 Laura Salaam MD, MRCP(U.K) [address]  [address]  [place], [place] [address]  Main: [phone]  Fax: [phone]   Primary Care Physician: Laura Pons, NP  Primary Gastroenterologist:  Dr. Luke [name]   Chief Complaint  Patient presents with   Bloated    HPI: Laura [name] is a 73 y.o. female Summary of history :   Being followed recently since February 2023 for diarrhea felt to be antibiotic induced   She was initially referred and seen on 01/25/2020 for diarrhea.She has previously been a patient of [facility] gastroenterology. She carries a diagnosis of collagenous colitis noted in 2017. Looking back at colon biopsy results from 2013 the biopsy results are not very specific for collagenous colitis. She was hospitalized in November 2020 and underwent resection of the small bowel due to ischemia.  During the surgery she underwent ileum and right colon resection.  Had a second look surgery.She says that prior to the surgery she was having features of constipation alternating with diarrhea.  Since the surgery she has been having only diarrhea up to 10-15 times a day.  At times she has accidents and she has to wear a diaper to prevent soiling her undergarments.  She denies any use of any artificial sugars, NSAIDs, Metformin .  She  transferred  care to our care while  she was on a PPI which has been stopped. Failed treatment with Questran , imodium and CREON    09/18/2019: CT scan of the abdomen shows status post distal small bowel resection with right hemicolectomy and enterocolonic anastomosis.   01/02/2020: Ferritin 25.  B12 and folate normal.  Hemoglobin 10.7 g in January 2021 with an MCV of 85.   01/27/2020: Fecal calprotectin normal, celiac serology negative.  Stool for C. difficile toxin was negative.  GI PCR of her stool showed enteroaggregative E. coli positive.  Commenced on azithromycin .     03/19/2020: Biopsies taken of the terminal ileum as well as colon.  No evidence of  any form of colitis or inflammation.    In November 2021 had a acute right MCA ischemic stroke likely embolic due to atrial fibrillation.  Occurred when she went off her anticoagulation for lumbar injection  02/11/2022: C. difficile negative, GI PCR negative fecal calprotectin positive at 136    Interval history 12/10/2023-04/15/2024  No recent imaging 04/13/2024 CBC, BMP normal   11/25/2023: cr 1.15  At the last visit she was having a lot of diarrhea but had commenced on Ozempic  and was having bloating.  Likely due to the effects of GLP.  Commenced on a gastroparesis diet.  Since then she is doing very well no complaints the diarrhea constipation has resolved she is learned to have small meals more often     Current Outpatient Medications  Medication Sig Dispense Refill   Accu-Chek Softclix Lancets lancets Accu-Chek Softclix Lancets  USE AS INSTRUCTED TO CHECK BLOOD SUGARS DAILY 2 HOURS AFTER MEAL     ALPRAZolam  (XANAX ) 0.25 MG tablet TAKE 1 TABLET BY MOUTH TWICE A DAY AS NEEDED FOR ANXIETY 30 tablet 0   COMBIVENT RESPIMAT 20-100 MCG/ACT AERS respimat SMARTSIG:1 Puff(s) Via Inhaler 4 Times Daily PRN     diltiazem  (CARDIZEM  CD) 120 MG 24 hr capsule Take 1 tablet by mouth daily.     ELIQUIS  5 MG TABS tablet TAKE 1 TABLET BY MOUTH TWICE A DAY 60 tablet 5   ENTRESTO  24-26 MG TAKE 1 TABLET BY MOUTH TWICE A DAY 60 tablet 4  EPINEPHrine  0.3 mg/0.3 mL IJ SOAJ injection Inject 0.3 mg into the muscle as needed for anaphylaxis.     famotidine  (PEPCID ) 40 MG tablet Take 1 tablet (40 mg total) by mouth at bedtime.     fluticasone  (FLONASE) 50 MCG/ACT nasal spray Place 1 spray into the nose daily.     furosemide  (LASIX ) 20 MG tablet Take 20 mg by mouth as needed for edema.     glucose blood (ACCU-CHEK GUIDE) test strip Use as instructed to check blood sugars daily 2 hours after meal DX E11.65 100 each 12   JARDIANCE  10 MG TABS tablet TAKE 1 TABLET BY MOUTH DAILY BEFORE BREAKFAST. 30 tablet 5    loperamide (IMODIUM) 2 MG capsule Take 4 mg by mouth as needed for diarrhea or loose stools.     montelukast  (SINGULAIR ) 10 MG tablet TAKE 1 TABLET BY MOUTH EVERY DAY 90 tablet 1   neomycin-polymyxin b-dexamethasone  (MAXITROL) 3.5-10000-0.1 SUSP USE 1 DROP INTO BOTH EYES 4 TIMES A DAY FOR 1 WEEK     Probiotic Product (PROBIOTIC-10) CAPS Take 1 capsule by mouth daily.     promethazine  (PHENERGAN ) 25 MG tablet Take 1 tablet (25 mg total) by mouth every 8 (eight) hours as needed for nausea or vomiting. 20 tablet 0   rosuvastatin  (CRESTOR ) 20 MG tablet TAKE 1 TABLET BY MOUTH EVERY DAY 90 tablet 2   Semaglutide , 1 MG/DOSE, (OZEMPIC , 1 MG/DOSE,) 4 MG/3ML SOPN Inject 1 mg into the skin once a week. 3 mL 0   spironolactone  (ALDACTONE ) 25 MG tablet TAKE 1/2 TABLET BY MOUTH EVERY DAY 45 tablet 1   traMADol  (ULTRAM ) 50 MG tablet Take 50 mg by mouth 4 (four) times daily as needed.     No current facility-administered medications for this visit.    Allergies as of 04/14/2024 - Review Complete 04/14/2024  Allergen Reaction Noted   Flecainide Shortness Of Breath 11/27/2016   Metoprolol  Shortness Of Breath, Swelling, and Other (See Comments) 01/13/2020   Propafenone Shortness Of Breath, Swelling, and Other (See Comments) 12/01/2016   Rivaroxaban Swelling and Other (See Comments) 10/23/2016   Tirzepatide  Diarrhea and Nausea And Vomiting 06/18/2023     ROS:  General: Negative for anorexia, weight loss, fever, chills, fatigue, weakness. ENT: Negative for hoarseness, difficulty swallowing , nasal congestion. CV: Negative for chest pain, angina, palpitations, dyspnea on exertion, peripheral edema.  Respiratory: Negative for dyspnea at rest, dyspnea on exertion, cough, sputum, wheezing.  GI: See history of present illness. GU:  Negative for dysuria, hematuria, urinary incontinence, urinary frequency, nocturnal urination.  Endo: Negative for unusual weight change.    Physical Examination:   BP 113/65    Pulse (!) 54   Temp 97.8 F (36.6 C) (Oral)   Wt 218 lb (98.9 kg)   LMP  (LMP Unknown)   BMI 37.42 kg/m   General: Well-nourished, well-developed in no acute distress.  Eyes: No icterus. Conjunctivae pink. Neuro: Alert and oriented x 3.  Grossly intact. Skin: Warm and dry, no jaundice.   Psych: Alert and cooperative, normal mood and affect.   Imaging Studies: No results found.  Assessment and Plan:   Laura [name] is a 73 y.o. y/o female who has had prior history of small bowel resection and prior diarrhea which has resolved after commencing on GLP-1 medications for weight loss as one of the side effects is constipation.  At her last visit she had a lot of symptoms suggestive of gastroparesis related medications which has responded very well  to a gastroparesis diet she is losing weight appropriately as expected and has modified her diet advised her to continue the same and call us  if she requires or help any other way  Dr Laura Salaam  MD,MRCP [facility]) Follow up in as needed

## 2024-04-18 ENCOUNTER — Telehealth: Payer: Self-pay

## 2024-04-18 DIAGNOSIS — Z9981 Dependence on supplemental oxygen: Secondary | ICD-10-CM | POA: Diagnosis not present

## 2024-04-18 DIAGNOSIS — I890 Lymphedema, not elsewhere classified: Secondary | ICD-10-CM | POA: Diagnosis not present

## 2024-04-18 DIAGNOSIS — I5022 Chronic systolic (congestive) heart failure: Secondary | ICD-10-CM | POA: Diagnosis not present

## 2024-04-18 DIAGNOSIS — I272 Pulmonary hypertension, unspecified: Secondary | ICD-10-CM | POA: Diagnosis not present

## 2024-04-18 DIAGNOSIS — Z7901 Long term (current) use of anticoagulants: Secondary | ICD-10-CM | POA: Diagnosis not present

## 2024-04-18 DIAGNOSIS — E119 Type 2 diabetes mellitus without complications: Secondary | ICD-10-CM | POA: Diagnosis not present

## 2024-04-18 DIAGNOSIS — Z8673 Personal history of transient ischemic attack (TIA), and cerebral infarction without residual deficits: Secondary | ICD-10-CM | POA: Diagnosis not present

## 2024-04-18 DIAGNOSIS — L03116 Cellulitis of left lower limb: Secondary | ICD-10-CM | POA: Diagnosis not present

## 2024-04-18 DIAGNOSIS — Z9181 History of falling: Secondary | ICD-10-CM | POA: Diagnosis not present

## 2024-04-18 DIAGNOSIS — M48061 Spinal stenosis, lumbar region without neurogenic claudication: Secondary | ICD-10-CM | POA: Diagnosis not present

## 2024-04-18 DIAGNOSIS — Z993 Dependence on wheelchair: Secondary | ICD-10-CM | POA: Diagnosis not present

## 2024-04-18 DIAGNOSIS — I2510 Atherosclerotic heart disease of native coronary artery without angina pectoris: Secondary | ICD-10-CM | POA: Diagnosis not present

## 2024-04-18 DIAGNOSIS — Z79899 Other long term (current) drug therapy: Secondary | ICD-10-CM | POA: Diagnosis not present

## 2024-04-18 DIAGNOSIS — I110 Hypertensive heart disease with heart failure: Secondary | ICD-10-CM | POA: Diagnosis not present

## 2024-04-18 DIAGNOSIS — Z7985 Long-term (current) use of injectable non-insulin antidiabetic drugs: Secondary | ICD-10-CM | POA: Diagnosis not present

## 2024-04-18 DIAGNOSIS — E669 Obesity, unspecified: Secondary | ICD-10-CM | POA: Diagnosis not present

## 2024-04-18 DIAGNOSIS — J4489 Other specified chronic obstructive pulmonary disease: Secondary | ICD-10-CM | POA: Diagnosis not present

## 2024-04-18 DIAGNOSIS — Z7984 Long term (current) use of oral hypoglycemic drugs: Secondary | ICD-10-CM | POA: Diagnosis not present

## 2024-04-18 DIAGNOSIS — Z6838 Body mass index (BMI) 38.0-38.9, adult: Secondary | ICD-10-CM | POA: Diagnosis not present

## 2024-04-18 DIAGNOSIS — Z7952 Long term (current) use of systemic steroids: Secondary | ICD-10-CM | POA: Diagnosis not present

## 2024-04-18 DIAGNOSIS — I482 Chronic atrial fibrillation, unspecified: Secondary | ICD-10-CM | POA: Diagnosis not present

## 2024-04-18 DIAGNOSIS — I428 Other cardiomyopathies: Secondary | ICD-10-CM | POA: Diagnosis not present

## 2024-04-18 NOTE — Telephone Encounter (Signed)
 Laura [name] from [facility] is requesting PT for 2 week 2 and then 1 week 6, verbal orders given [phone]

## 2024-04-20 ENCOUNTER — Telehealth: Payer: Self-pay | Admitting: Cardiology

## 2024-04-20 NOTE — Telephone Encounter (Signed)
 Called to confirm/remind patient of [facility] on [facility].   Appointment:   [x] Confirmed  [] Left mess   [] No answer/No voice mail  [] VM Full/unable to leave message  [] Phone not in service  Patient reminded to bring all medications and/or complete list.  Confirmed patient has transportation. Gave directions, instructed to utilize valet parking.

## 2024-04-21 ENCOUNTER — Other Ambulatory Visit: Payer: Self-pay | Admitting: Nurse Practitioner

## 2024-04-21 ENCOUNTER — Other Ambulatory Visit
Admission: RE | Admit: 2024-04-21 | Discharge: 2024-04-21 | Disposition: A | Discharge status: Home / Self Care | Source: Ambulatory Visit | Attending: Cardiology | Admitting: Cardiology

## 2024-04-21 ENCOUNTER — Encounter: Payer: Self-pay | Admitting: Cardiology

## 2024-04-21 ENCOUNTER — Ambulatory Visit: Admitting: Cardiology

## 2024-04-21 ENCOUNTER — Ambulatory Visit
Admission: RE | Admit: 2024-04-21 | Discharge: 2024-04-21 | Disposition: A | Discharge status: Home / Self Care | Source: Ambulatory Visit | Attending: Cardiology | Admitting: Cardiology

## 2024-04-21 ENCOUNTER — Other Ambulatory Visit: Payer: Self-pay | Admitting: Cardiology

## 2024-04-21 VITALS — BP 97/63 | HR 85 | Wt 230.6 lb

## 2024-04-21 DIAGNOSIS — Z8673 Personal history of transient ischemic attack (TIA), and cerebral infarction without residual deficits: Secondary | ICD-10-CM | POA: Diagnosis not present

## 2024-04-21 DIAGNOSIS — Z8616 Personal history of COVID-19: Secondary | ICD-10-CM | POA: Diagnosis not present

## 2024-04-21 DIAGNOSIS — Z79899 Other long term (current) drug therapy: Secondary | ICD-10-CM | POA: Insufficient documentation

## 2024-04-21 DIAGNOSIS — Z760 Encounter for issue of repeat prescription: Secondary | ICD-10-CM

## 2024-04-21 DIAGNOSIS — I4821 Permanent atrial fibrillation: Secondary | ICD-10-CM | POA: Insufficient documentation

## 2024-04-21 DIAGNOSIS — Z8249 Family history of ischemic heart disease and other diseases of the circulatory system: Secondary | ICD-10-CM | POA: Insufficient documentation

## 2024-04-21 DIAGNOSIS — I951 Orthostatic hypotension: Secondary | ICD-10-CM | POA: Diagnosis not present

## 2024-04-21 DIAGNOSIS — Z5986 Financial insecurity: Secondary | ICD-10-CM | POA: Insufficient documentation

## 2024-04-21 DIAGNOSIS — I110 Hypertensive heart disease with heart failure: Secondary | ICD-10-CM | POA: Insufficient documentation

## 2024-04-21 DIAGNOSIS — Q211 Atrial septal defect, unspecified: Secondary | ICD-10-CM | POA: Diagnosis not present

## 2024-04-21 DIAGNOSIS — Z7901 Long term (current) use of anticoagulants: Secondary | ICD-10-CM | POA: Insufficient documentation

## 2024-04-21 DIAGNOSIS — I5022 Chronic systolic (congestive) heart failure: Secondary | ICD-10-CM

## 2024-04-21 DIAGNOSIS — M25551 Pain in right hip: Secondary | ICD-10-CM | POA: Insufficient documentation

## 2024-04-21 DIAGNOSIS — I482 Chronic atrial fibrillation, unspecified: Secondary | ICD-10-CM | POA: Diagnosis not present

## 2024-04-21 DIAGNOSIS — E669 Obesity, unspecified: Secondary | ICD-10-CM | POA: Insufficient documentation

## 2024-04-21 DIAGNOSIS — G4733 Obstructive sleep apnea (adult) (pediatric): Secondary | ICD-10-CM | POA: Diagnosis not present

## 2024-04-21 DIAGNOSIS — I2510 Atherosclerotic heart disease of native coronary artery without angina pectoris: Secondary | ICD-10-CM | POA: Diagnosis not present

## 2024-04-21 DIAGNOSIS — Z6839 Body mass index (BMI) 39.0-39.9, adult: Secondary | ICD-10-CM | POA: Diagnosis not present

## 2024-04-21 DIAGNOSIS — I428 Other cardiomyopathies: Secondary | ICD-10-CM | POA: Insufficient documentation

## 2024-04-21 DIAGNOSIS — J4489 Other specified chronic obstructive pulmonary disease: Secondary | ICD-10-CM | POA: Insufficient documentation

## 2024-04-21 LAB — BASIC METABOLIC PANEL WITH GFR
Anion gap: 5 (ref 5–15)
BUN: 14 mg/dL (ref 8–23)
CO2: 26 mmol/L (ref 22–32)
Calcium: 9.1 mg/dL (ref 8.9–10.3)
Chloride: 107 mmol/L (ref 98–111)
Creatinine, Ser: 0.84 mg/dL (ref 0.44–1.00)
GFR, Estimated: >60 mL/min (ref >60)
Glucose, Bld: 100 mg/dL — ABNORMAL HIGH (ref 70–99)
Potassium: 3.6 mmol/L (ref 3.5–5.1)
Sodium: 138 mmol/L (ref 135–145)

## 2024-04-21 LAB — ECHOCARDIOGRAM COMPLETE
AR max vel: 1.98 cm2
AV Area VTI: 2.12 cm2
AV Area mean vel: 1.79 cm2
AV Mean grad: 3 mmHg
AV Peak grad: 5.7 mmHg
Ao pk vel: 1.19 m/s
Area-P 1/2: 3.72 cm2
MV VTI: 1.46 cm2
S' Lateral: 3.9 cm

## 2024-04-21 LAB — BRAIN NATRIURETIC PEPTIDE: B Natriuretic Peptide: 91.3 pg/mL (ref 0.0–100.0)

## 2024-04-21 MED ORDER — METOPROLOL SUCCINATE ER 25 MG PO TB24: 25.0000 mg | ORAL_TABLET | Freq: Two times a day (BID) | ORAL | 3 refills | Status: AC

## 2024-04-21 MED ORDER — FUROSEMIDE 20 MG PO TABS: 20.0000 mg | ORAL_TABLET | Freq: Every day | ORAL | 11 refills | Status: AC

## 2024-04-21 NOTE — Telephone Encounter (Signed)
 Please review

## 2024-04-21 NOTE — Progress Notes (Signed)
 ReDS Vest / Clip - 04/21/24 1043       ReDS Vest / Clip   Station Marker B    Ruler Value 33    ReDS Value Range Low volume    ReDS Actual Value 32
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
## 2024-04-21 NOTE — Patient Instructions (Signed)
 Medication Changes:  STOP Diltiazem   START Metoprolol  25mg  (1 tab) two times daily.  Lab Work:  Go DOWN to LOWER LEVEL (LL) to have your blood work completed inside of [employer] office.  We will only call you if the results are abnormal or if the provider would like to make medication changes.   Testing/Procedures:  Your physician has requested that you have a cardiac MRI. Cardiac MRI uses a computer to create images of your heart as its beating, producing both still and moving pictures of your heart and major blood vessels. For further information please visit [email or web address]. Please follow the instruction sheet given to you today for more information.    Follow-Up in: Please follow up with the [facility] in 2 weeks with Shawnee Dellen, FNP.  At the [facility], you and your health needs are our priority. We have a designated team specialized in the treatment of Heart Failure. This Care Team includes your primary Heart Failure Specialized Cardiologist (physician), Advanced Practice Providers (APPs- Physician Assistants and Nurse Practitioners), and Pharmacist who all work together to provide you with the care you need, when you need it.   You may see any of the following providers on your designated Care Team at your next follow up:  Dr. Jules Oar Dr. Peder Bourdon Dr. Alwin Baars Dr. Judyth Nunnery Shawnee Dellen, FNP Bevely Brush, RPH-CPP  Please be sure to bring in all your medications bottles to every appointment.   Need to Contact Us :  If you have any questions or concerns before your next appointment please send us  a message through [place] or call our office at [phone].    TO LEAVE A MESSAGE FOR THE NURSE SELECT OPTION 2, PLEASE LEAVE A MESSAGE INCLUDING: YOUR NAME DATE OF BIRTH CALL BACK NUMBER REASON FOR CALL**this is important as we prioritize the call backs  YOU WILL RECEIVE A CALL BACK THE SAME DAY AS LONG AS YOU  CALL BEFORE 4:00 PM

## 2024-04-21 NOTE — Progress Notes (Signed)
*  PRELIMINARY RESULTS* Echocardiogram 2D Echocardiogram has been performed.  [name] 04/21/2024, 10:27 AM

## 2024-04-22 DIAGNOSIS — G468 Other vascular syndromes of brain in cerebrovascular diseases: Secondary | ICD-10-CM | POA: Diagnosis not present

## 2024-04-22 DIAGNOSIS — G4733 Obstructive sleep apnea (adult) (pediatric): Secondary | ICD-10-CM | POA: Diagnosis not present

## 2024-04-22 DIAGNOSIS — G5603 Carpal tunnel syndrome, bilateral upper limbs: Secondary | ICD-10-CM | POA: Diagnosis not present

## 2024-04-22 DIAGNOSIS — G4700 Insomnia, unspecified: Secondary | ICD-10-CM | POA: Diagnosis not present

## 2024-04-22 NOTE — Telephone Encounter (Signed)
 Please review

## 2024-04-25 DIAGNOSIS — I482 Chronic atrial fibrillation, unspecified: Secondary | ICD-10-CM | POA: Diagnosis not present

## 2024-04-25 DIAGNOSIS — L03116 Cellulitis of left lower limb: Secondary | ICD-10-CM | POA: Diagnosis not present

## 2024-04-25 DIAGNOSIS — I110 Hypertensive heart disease with heart failure: Secondary | ICD-10-CM | POA: Diagnosis not present

## 2024-04-25 DIAGNOSIS — E119 Type 2 diabetes mellitus without complications: Secondary | ICD-10-CM | POA: Diagnosis not present

## 2024-04-25 DIAGNOSIS — I5022 Chronic systolic (congestive) heart failure: Secondary | ICD-10-CM | POA: Diagnosis not present

## 2024-04-25 DIAGNOSIS — I272 Pulmonary hypertension, unspecified: Secondary | ICD-10-CM | POA: Diagnosis not present

## 2024-04-25 MED ORDER — ALPRAZOLAM 0.25 MG PO TABS: ORAL_TABLET | ORAL | 0 refills | Status: AC

## 2024-04-26 ENCOUNTER — Encounter: Payer: Self-pay | Admitting: Nurse Practitioner

## 2024-04-26 ENCOUNTER — Telehealth: Payer: Self-pay | Admitting: Physician Assistant

## 2024-04-26 NOTE — Telephone Encounter (Signed)
 Received request for last 12 months of office notes pertaining to cpap use. Faxed back to [facility] that patient has not been seen since 01/26/2023 for pulmonary care and has new patient appointment w/ [place] for 05/09/2024-Laura [name]

## 2024-04-28 ENCOUNTER — Telehealth: Payer: Self-pay

## 2024-04-28 NOTE — Telephone Encounter (Signed)
 Gave verbal order to chris from [facility] [phone] twice a week for 2 and once a week for 6 week

## 2024-04-29 ENCOUNTER — Telehealth: Payer: Self-pay | Admitting: Nurse Practitioner

## 2024-04-29 NOTE — Telephone Encounter (Signed)
 Received Albuterol  order from [facility]. Gave to Alyssa for signature-Toni

## 2024-05-02 ENCOUNTER — Telehealth: Payer: Self-pay | Admitting: Nurse Practitioner

## 2024-05-02 NOTE — Telephone Encounter (Signed)
 Albuterol  order signed. Faxed back to [place]; [phone]. Scanned-Toni

## 2024-05-03 DIAGNOSIS — I482 Chronic atrial fibrillation, unspecified: Secondary | ICD-10-CM | POA: Diagnosis not present

## 2024-05-03 DIAGNOSIS — I272 Pulmonary hypertension, unspecified: Secondary | ICD-10-CM | POA: Diagnosis not present

## 2024-05-03 DIAGNOSIS — I110 Hypertensive heart disease with heart failure: Secondary | ICD-10-CM | POA: Diagnosis not present

## 2024-05-03 DIAGNOSIS — E119 Type 2 diabetes mellitus without complications: Secondary | ICD-10-CM | POA: Diagnosis not present

## 2024-05-03 DIAGNOSIS — L03116 Cellulitis of left lower limb: Secondary | ICD-10-CM | POA: Diagnosis not present

## 2024-05-03 DIAGNOSIS — I5022 Chronic systolic (congestive) heart failure: Secondary | ICD-10-CM | POA: Diagnosis not present

## 2024-05-04 DIAGNOSIS — M8580 Other specified disorders of bone density and structure, unspecified site: Secondary | ICD-10-CM | POA: Diagnosis not present

## 2024-05-04 DIAGNOSIS — M85851 Other specified disorders of bone density and structure, right thigh: Secondary | ICD-10-CM | POA: Diagnosis not present

## 2024-05-04 DIAGNOSIS — M1611 Unilateral primary osteoarthritis, right hip: Secondary | ICD-10-CM | POA: Diagnosis not present

## 2024-05-04 DIAGNOSIS — M810 Age-related osteoporosis without current pathological fracture: Secondary | ICD-10-CM | POA: Diagnosis not present

## 2024-05-04 DIAGNOSIS — M25551 Pain in right hip: Secondary | ICD-10-CM | POA: Diagnosis not present

## 2024-05-05 ENCOUNTER — Telehealth: Payer: Self-pay

## 2024-05-05 ENCOUNTER — Encounter: Payer: Self-pay | Admitting: Family

## 2024-05-05 ENCOUNTER — Telehealth: Payer: Self-pay | Admitting: *Deleted

## 2024-05-05 ENCOUNTER — Telehealth: Payer: Self-pay | Admitting: Nurse Practitioner

## 2024-05-05 DIAGNOSIS — E669 Obesity, unspecified: Secondary | ICD-10-CM | POA: Diagnosis not present

## 2024-05-05 DIAGNOSIS — Z7984 Long term (current) use of oral hypoglycemic drugs: Secondary | ICD-10-CM | POA: Diagnosis not present

## 2024-05-05 DIAGNOSIS — Z9989 Dependence on other enabling machines and devices: Secondary | ICD-10-CM | POA: Diagnosis not present

## 2024-05-05 DIAGNOSIS — N1832 Chronic kidney disease, stage 3b: Secondary | ICD-10-CM | POA: Diagnosis not present

## 2024-05-05 DIAGNOSIS — J441 Chronic obstructive pulmonary disease with (acute) exacerbation: Secondary | ICD-10-CM | POA: Diagnosis not present

## 2024-05-05 DIAGNOSIS — R002 Palpitations: Secondary | ICD-10-CM | POA: Diagnosis not present

## 2024-05-05 DIAGNOSIS — I482 Chronic atrial fibrillation, unspecified: Secondary | ICD-10-CM | POA: Diagnosis not present

## 2024-05-05 DIAGNOSIS — I129 Hypertensive chronic kidney disease with stage 1 through stage 4 chronic kidney disease, or unspecified chronic kidney disease: Secondary | ICD-10-CM | POA: Diagnosis not present

## 2024-05-05 DIAGNOSIS — Z1152 Encounter for screening for COVID-19: Secondary | ICD-10-CM | POA: Diagnosis not present

## 2024-05-05 DIAGNOSIS — I2510 Atherosclerotic heart disease of native coronary artery without angina pectoris: Secondary | ICD-10-CM | POA: Diagnosis present

## 2024-05-05 DIAGNOSIS — R0602 Shortness of breath: Secondary | ICD-10-CM | POA: Diagnosis not present

## 2024-05-05 DIAGNOSIS — E861 Hypovolemia: Secondary | ICD-10-CM | POA: Diagnosis not present

## 2024-05-05 DIAGNOSIS — R0600 Dyspnea, unspecified: Secondary | ICD-10-CM | POA: Diagnosis not present

## 2024-05-05 DIAGNOSIS — R0609 Other forms of dyspnea: Secondary | ICD-10-CM | POA: Diagnosis not present

## 2024-05-05 DIAGNOSIS — E870 Hyperosmolality and hypernatremia: Secondary | ICD-10-CM | POA: Diagnosis not present

## 2024-05-05 DIAGNOSIS — I428 Other cardiomyopathies: Secondary | ICD-10-CM | POA: Diagnosis not present

## 2024-05-05 DIAGNOSIS — L03116 Cellulitis of left lower limb: Secondary | ICD-10-CM | POA: Diagnosis not present

## 2024-05-05 DIAGNOSIS — I5020 Unspecified systolic (congestive) heart failure: Secondary | ICD-10-CM | POA: Diagnosis not present

## 2024-05-05 DIAGNOSIS — I10 Essential (primary) hypertension: Secondary | ICD-10-CM | POA: Diagnosis not present

## 2024-05-05 DIAGNOSIS — E119 Type 2 diabetes mellitus without complications: Secondary | ICD-10-CM | POA: Diagnosis not present

## 2024-05-05 DIAGNOSIS — I4891 Unspecified atrial fibrillation: Secondary | ICD-10-CM | POA: Diagnosis not present

## 2024-05-05 DIAGNOSIS — G4733 Obstructive sleep apnea (adult) (pediatric): Secondary | ICD-10-CM | POA: Diagnosis not present

## 2024-05-05 DIAGNOSIS — J45901 Unspecified asthma with (acute) exacerbation: Secondary | ICD-10-CM | POA: Diagnosis not present

## 2024-05-05 DIAGNOSIS — M25559 Pain in unspecified hip: Secondary | ICD-10-CM | POA: Diagnosis not present

## 2024-05-05 DIAGNOSIS — I272 Pulmonary hypertension, unspecified: Secondary | ICD-10-CM | POA: Diagnosis not present

## 2024-05-05 DIAGNOSIS — I639 Cerebral infarction, unspecified: Secondary | ICD-10-CM | POA: Diagnosis not present

## 2024-05-05 DIAGNOSIS — J189 Pneumonia, unspecified organism: Secondary | ICD-10-CM | POA: Diagnosis not present

## 2024-05-05 DIAGNOSIS — I4821 Permanent atrial fibrillation: Secondary | ICD-10-CM | POA: Diagnosis not present

## 2024-05-05 DIAGNOSIS — R54 Age-related physical debility: Secondary | ICD-10-CM | POA: Diagnosis not present

## 2024-05-05 DIAGNOSIS — J449 Chronic obstructive pulmonary disease, unspecified: Secondary | ICD-10-CM | POA: Diagnosis not present

## 2024-05-05 DIAGNOSIS — G4700 Insomnia, unspecified: Secondary | ICD-10-CM | POA: Diagnosis not present

## 2024-05-05 DIAGNOSIS — J9611 Chronic respiratory failure with hypoxia: Secondary | ICD-10-CM | POA: Diagnosis not present

## 2024-05-05 DIAGNOSIS — Z6841 Body Mass Index (BMI) 40.0 and over, adult: Secondary | ICD-10-CM | POA: Diagnosis not present

## 2024-05-05 DIAGNOSIS — R079 Chest pain, unspecified: Secondary | ICD-10-CM | POA: Diagnosis not present

## 2024-05-05 DIAGNOSIS — Z7901 Long term (current) use of anticoagulants: Secondary | ICD-10-CM | POA: Diagnosis not present

## 2024-05-05 DIAGNOSIS — I4819 Other persistent atrial fibrillation: Secondary | ICD-10-CM | POA: Diagnosis not present

## 2024-05-05 DIAGNOSIS — I959 Hypotension, unspecified: Secondary | ICD-10-CM | POA: Diagnosis not present

## 2024-05-05 DIAGNOSIS — R0789 Other chest pain: Secondary | ICD-10-CM | POA: Diagnosis not present

## 2024-05-05 DIAGNOSIS — I110 Hypertensive heart disease with heart failure: Secondary | ICD-10-CM | POA: Diagnosis not present

## 2024-05-05 DIAGNOSIS — F411 Generalized anxiety disorder: Secondary | ICD-10-CM | POA: Diagnosis not present

## 2024-05-05 DIAGNOSIS — D631 Anemia in chronic kidney disease: Secondary | ICD-10-CM | POA: Diagnosis not present

## 2024-05-05 DIAGNOSIS — I69354 Hemiplegia and hemiparesis following cerebral infarction affecting left non-dominant side: Secondary | ICD-10-CM | POA: Diagnosis not present

## 2024-05-05 DIAGNOSIS — I5022 Chronic systolic (congestive) heart failure: Secondary | ICD-10-CM | POA: Diagnosis not present

## 2024-05-05 DIAGNOSIS — E1122 Type 2 diabetes mellitus with diabetic chronic kidney disease: Secondary | ICD-10-CM | POA: Diagnosis not present

## 2024-05-05 NOTE — Telephone Encounter (Signed)
   Pre-operative Risk Assessment    Patient Name: Laura [name]  DOB: Jan 08, 1951 MRN: [id number]   Date of last office visit: 04/21/24 DR. MCLEAN Date of next office visit: 05/09/24 TINA HACKNEY, PAC   Request for Surgical Clearance    Procedure:  RIGHT TOTAL HIP ARTHROPLASTY  Date of Surgery:  Clearance TBD                                Surgeon:  DR. ABERMAN Surgeon's Group or Practice Name:  [facility] Phone number:  [phone] Fax number:  [phone]   Type of Clearance Requested:   - Medical  - Pharmacy:  Hold Apixaban  (Eliquis )     Type of Anesthesia:  Not Indicated (GENERAL?)   Additional requests/questions:    Princ[place] Broom   05/05/2024, 12:45 PM

## 2024-05-05 NOTE — Telephone Encounter (Signed)
 Physical therapy Scherry Curtis [phone] that  chest pain and Her BP  is 80/50 as per alyssa advised her pt need go to ED and also I called pt and advised as per alyssa  that she need to go ED ASAP  pt daughter is taking  her to ED or also advised to call 911 if needed

## 2024-05-05 NOTE — Telephone Encounter (Signed)
 Received surgical clearance from [facility]. Gave to Alyssa-Toni

## 2024-05-05 NOTE — Telephone Encounter (Signed)
 Patient with diagnosis of A Fib on Eliquis  for anticoagulation.    Procedure: RIGHT TOTAL HIP ARTHROPLASTY  Date of procedure: TBD   CHA2DS2-VASc Score = 8  This indicates a 10.8% annual risk of stroke. The patient's score is based upon: CHF History: 1 HTN History: 1 Diabetes History: 1 Stroke History: 2 Vascular Disease History: 1 Age Score: 1 Gender Score: 1     CrCl 97 ml/min Platelet count 221K  Patient will need to hold Eliquis  for 3 days for procedure. Due to elevated risk score plus history of stroke, will route to Dr Mitzie Anda for input  **This guidance is not considered finalized until pre-operative APP has relayed final recommendations.**

## 2024-05-05 NOTE — Telephone Encounter (Signed)
   Name: Laura [name]  DOB: [birth year]  MRN: [id number]  Primary Cardiologist: Antionette Kirks, MD  Chart reviewed as part of pre-operative protocol coverage. The patient has an upcoming visit scheduled with Shawnee Dellen, PA on 05/09/2024 at which time clearance can be addressed in case there are any issues that would impact surgical recommendations.  Right total hip arthoplasty is not scheduled until TBD as below. I added preop FYI to appointment note so that provider is aware to address at time of outpatient visit.  Per office protocol the cardiology provider should forward their finalized clearance decision and recommendations regarding antiplatelet therapy to the requesting party below.    This message will also be routed to pharmacy pool for input on holding Eliquis  as requested below so that this information is available to the clearing provider at time of patient's appointment.   I will route this message as FYI to requesting party and remove this message from the preop box as separate preop APP input not needed at this time.   Please call with any questions.  Ava Boatman, NP  05/05/2024, 1:12 PM

## 2024-05-06 ENCOUNTER — Telehealth: Payer: Self-pay | Admitting: Family

## 2024-05-06 DIAGNOSIS — R079 Chest pain, unspecified: Secondary | ICD-10-CM | POA: Diagnosis not present

## 2024-05-06 NOTE — Telephone Encounter (Signed)
 Called to confirm/remind patient of their appointment at the [facility] on 05/09/24.   Appointment:   [x] Confirmed  [] Left mess   [] No answer/No voice mail  [] VM Full/unable to leave message  [] Phone not in service  Patient reminded to bring all medications and/or complete list.  Confirmed patient has transportation. Gave directions, instructed to utilize valet parking.

## 2024-05-07 ENCOUNTER — Other Ambulatory Visit: Payer: Self-pay | Admitting: Cardiovascular Disease

## 2024-05-07 DIAGNOSIS — R079 Chest pain, unspecified: Secondary | ICD-10-CM | POA: Diagnosis not present

## 2024-05-07 DIAGNOSIS — R0600 Dyspnea, unspecified: Secondary | ICD-10-CM | POA: Diagnosis not present

## 2024-05-07 DIAGNOSIS — I639 Cerebral infarction, unspecified: Secondary | ICD-10-CM | POA: Diagnosis not present

## 2024-05-07 DIAGNOSIS — J449 Chronic obstructive pulmonary disease, unspecified: Secondary | ICD-10-CM | POA: Diagnosis not present

## 2024-05-07 DIAGNOSIS — I2510 Atherosclerotic heart disease of native coronary artery without angina pectoris: Secondary | ICD-10-CM | POA: Diagnosis not present

## 2024-05-07 DIAGNOSIS — E119 Type 2 diabetes mellitus without complications: Secondary | ICD-10-CM | POA: Diagnosis not present

## 2024-05-07 DIAGNOSIS — I10 Essential (primary) hypertension: Secondary | ICD-10-CM | POA: Diagnosis not present

## 2024-05-07 DIAGNOSIS — I959 Hypotension, unspecified: Secondary | ICD-10-CM | POA: Diagnosis not present

## 2024-05-08 DIAGNOSIS — I4819 Other persistent atrial fibrillation: Secondary | ICD-10-CM | POA: Diagnosis not present

## 2024-05-08 DIAGNOSIS — R002 Palpitations: Secondary | ICD-10-CM | POA: Diagnosis not present

## 2024-05-08 DIAGNOSIS — I4891 Unspecified atrial fibrillation: Secondary | ICD-10-CM | POA: Diagnosis not present

## 2024-05-08 DIAGNOSIS — I959 Hypotension, unspecified: Secondary | ICD-10-CM | POA: Diagnosis not present

## 2024-05-08 DIAGNOSIS — R0609 Other forms of dyspnea: Secondary | ICD-10-CM | POA: Diagnosis not present

## 2024-05-08 DIAGNOSIS — R079 Chest pain, unspecified: Secondary | ICD-10-CM | POA: Diagnosis not present

## 2024-05-08 DIAGNOSIS — I272 Pulmonary hypertension, unspecified: Secondary | ICD-10-CM | POA: Diagnosis not present

## 2024-05-08 DIAGNOSIS — I428 Other cardiomyopathies: Secondary | ICD-10-CM | POA: Diagnosis not present

## 2024-05-08 DIAGNOSIS — I5020 Unspecified systolic (congestive) heart failure: Secondary | ICD-10-CM | POA: Diagnosis not present

## 2024-05-08 DIAGNOSIS — I5022 Chronic systolic (congestive) heart failure: Secondary | ICD-10-CM | POA: Diagnosis not present

## 2024-05-09 ENCOUNTER — Encounter: Admitting: Family

## 2024-05-09 ENCOUNTER — Ambulatory Visit: Admitting: Pulmonary Disease

## 2024-05-09 ENCOUNTER — Telehealth: Payer: Self-pay | Admitting: Nurse Practitioner

## 2024-05-09 DIAGNOSIS — I5022 Chronic systolic (congestive) heart failure: Secondary | ICD-10-CM | POA: Diagnosis not present

## 2024-05-09 DIAGNOSIS — I428 Other cardiomyopathies: Secondary | ICD-10-CM | POA: Diagnosis not present

## 2024-05-09 DIAGNOSIS — J441 Chronic obstructive pulmonary disease with (acute) exacerbation: Secondary | ICD-10-CM | POA: Diagnosis not present

## 2024-05-09 DIAGNOSIS — E870 Hyperosmolality and hypernatremia: Secondary | ICD-10-CM | POA: Diagnosis not present

## 2024-05-09 DIAGNOSIS — N1832 Chronic kidney disease, stage 3b: Secondary | ICD-10-CM | POA: Diagnosis not present

## 2024-05-09 DIAGNOSIS — J45901 Unspecified asthma with (acute) exacerbation: Secondary | ICD-10-CM | POA: Diagnosis not present

## 2024-05-09 DIAGNOSIS — I4819 Other persistent atrial fibrillation: Secondary | ICD-10-CM | POA: Diagnosis not present

## 2024-05-09 DIAGNOSIS — I272 Pulmonary hypertension, unspecified: Secondary | ICD-10-CM | POA: Diagnosis not present

## 2024-05-09 DIAGNOSIS — I4891 Unspecified atrial fibrillation: Secondary | ICD-10-CM | POA: Diagnosis not present

## 2024-05-09 NOTE — Telephone Encounter (Addendum)
 Lvm to schedule appointment for surgical clearance-Toni  S/w patient. She stated she is in hospital right due to being so sick. She also stated surgery is not until July. She will call back to schedule appointment for surgical clearance-Toni

## 2024-05-10 ENCOUNTER — Encounter (INDEPENDENT_AMBULATORY_CARE_PROVIDER_SITE_OTHER): Payer: Self-pay

## 2024-05-10 DIAGNOSIS — G4733 Obstructive sleep apnea (adult) (pediatric): Secondary | ICD-10-CM | POA: Diagnosis not present

## 2024-05-10 DIAGNOSIS — J9611 Chronic respiratory failure with hypoxia: Secondary | ICD-10-CM | POA: Diagnosis not present

## 2024-05-10 DIAGNOSIS — I5022 Chronic systolic (congestive) heart failure: Secondary | ICD-10-CM | POA: Diagnosis not present

## 2024-05-10 DIAGNOSIS — I428 Other cardiomyopathies: Secondary | ICD-10-CM | POA: Diagnosis not present

## 2024-05-10 DIAGNOSIS — I272 Pulmonary hypertension, unspecified: Secondary | ICD-10-CM | POA: Diagnosis not present

## 2024-05-10 DIAGNOSIS — I4891 Unspecified atrial fibrillation: Secondary | ICD-10-CM | POA: Diagnosis not present

## 2024-05-10 DIAGNOSIS — F411 Generalized anxiety disorder: Secondary | ICD-10-CM | POA: Diagnosis not present

## 2024-05-10 DIAGNOSIS — I4819 Other persistent atrial fibrillation: Secondary | ICD-10-CM | POA: Diagnosis not present

## 2024-05-10 DIAGNOSIS — J449 Chronic obstructive pulmonary disease, unspecified: Secondary | ICD-10-CM | POA: Diagnosis not present

## 2024-05-11 ENCOUNTER — Telehealth: Payer: Self-pay | Admitting: Internal Medicine

## 2024-05-11 DIAGNOSIS — J449 Chronic obstructive pulmonary disease, unspecified: Secondary | ICD-10-CM | POA: Diagnosis not present

## 2024-05-11 DIAGNOSIS — I272 Pulmonary hypertension, unspecified: Secondary | ICD-10-CM | POA: Diagnosis not present

## 2024-05-11 DIAGNOSIS — F411 Generalized anxiety disorder: Secondary | ICD-10-CM | POA: Diagnosis not present

## 2024-05-11 DIAGNOSIS — I428 Other cardiomyopathies: Secondary | ICD-10-CM | POA: Diagnosis not present

## 2024-05-11 DIAGNOSIS — G4733 Obstructive sleep apnea (adult) (pediatric): Secondary | ICD-10-CM | POA: Diagnosis not present

## 2024-05-11 DIAGNOSIS — J9611 Chronic respiratory failure with hypoxia: Secondary | ICD-10-CM | POA: Diagnosis not present

## 2024-05-11 DIAGNOSIS — I5022 Chronic systolic (congestive) heart failure: Secondary | ICD-10-CM | POA: Diagnosis not present

## 2024-05-11 DIAGNOSIS — I4819 Other persistent atrial fibrillation: Secondary | ICD-10-CM | POA: Diagnosis not present

## 2024-05-11 DIAGNOSIS — I4891 Unspecified atrial fibrillation: Secondary | ICD-10-CM | POA: Diagnosis not present

## 2024-05-11 NOTE — Telephone Encounter (Signed)
 Received request from [facility] for office notes within last 12 months of 03/24/24. Sent fax back stating patient not seen since 02/05/254 for pulmonary-Toni

## 2024-05-12 DIAGNOSIS — I4891 Unspecified atrial fibrillation: Secondary | ICD-10-CM | POA: Diagnosis not present

## 2024-05-12 DIAGNOSIS — J449 Chronic obstructive pulmonary disease, unspecified: Secondary | ICD-10-CM | POA: Diagnosis not present

## 2024-05-12 DIAGNOSIS — I4819 Other persistent atrial fibrillation: Secondary | ICD-10-CM | POA: Diagnosis not present

## 2024-05-12 DIAGNOSIS — G4733 Obstructive sleep apnea (adult) (pediatric): Secondary | ICD-10-CM | POA: Diagnosis not present

## 2024-05-12 DIAGNOSIS — J9611 Chronic respiratory failure with hypoxia: Secondary | ICD-10-CM | POA: Diagnosis not present

## 2024-05-12 DIAGNOSIS — E870 Hyperosmolality and hypernatremia: Secondary | ICD-10-CM | POA: Diagnosis not present

## 2024-05-12 DIAGNOSIS — F411 Generalized anxiety disorder: Secondary | ICD-10-CM | POA: Diagnosis not present

## 2024-05-12 DIAGNOSIS — I428 Other cardiomyopathies: Secondary | ICD-10-CM | POA: Diagnosis not present

## 2024-05-12 DIAGNOSIS — I5022 Chronic systolic (congestive) heart failure: Secondary | ICD-10-CM | POA: Diagnosis not present

## 2024-05-13 DIAGNOSIS — I4819 Other persistent atrial fibrillation: Secondary | ICD-10-CM | POA: Diagnosis not present

## 2024-05-13 DIAGNOSIS — I428 Other cardiomyopathies: Secondary | ICD-10-CM | POA: Diagnosis not present

## 2024-05-13 DIAGNOSIS — I5022 Chronic systolic (congestive) heart failure: Secondary | ICD-10-CM | POA: Diagnosis not present

## 2024-05-13 DIAGNOSIS — G4733 Obstructive sleep apnea (adult) (pediatric): Secondary | ICD-10-CM | POA: Diagnosis not present

## 2024-05-13 DIAGNOSIS — M25559 Pain in unspecified hip: Secondary | ICD-10-CM | POA: Diagnosis not present

## 2024-05-13 DIAGNOSIS — J9611 Chronic respiratory failure with hypoxia: Secondary | ICD-10-CM | POA: Diagnosis not present

## 2024-05-13 DIAGNOSIS — F411 Generalized anxiety disorder: Secondary | ICD-10-CM | POA: Diagnosis not present

## 2024-05-13 DIAGNOSIS — J449 Chronic obstructive pulmonary disease, unspecified: Secondary | ICD-10-CM | POA: Diagnosis not present

## 2024-05-13 DIAGNOSIS — I4891 Unspecified atrial fibrillation: Secondary | ICD-10-CM | POA: Diagnosis not present

## 2024-05-17 ENCOUNTER — Encounter: Admitting: Family

## 2024-05-18 DIAGNOSIS — Z6838 Body mass index (BMI) 38.0-38.9, adult: Secondary | ICD-10-CM | POA: Diagnosis not present

## 2024-05-18 DIAGNOSIS — I482 Chronic atrial fibrillation, unspecified: Secondary | ICD-10-CM | POA: Diagnosis not present

## 2024-05-18 DIAGNOSIS — Z7952 Long term (current) use of systemic steroids: Secondary | ICD-10-CM | POA: Diagnosis not present

## 2024-05-18 DIAGNOSIS — Z7985 Long-term (current) use of injectable non-insulin antidiabetic drugs: Secondary | ICD-10-CM | POA: Diagnosis not present

## 2024-05-18 DIAGNOSIS — Z993 Dependence on wheelchair: Secondary | ICD-10-CM | POA: Diagnosis not present

## 2024-05-18 DIAGNOSIS — E669 Obesity, unspecified: Secondary | ICD-10-CM | POA: Diagnosis not present

## 2024-05-18 DIAGNOSIS — Z7984 Long term (current) use of oral hypoglycemic drugs: Secondary | ICD-10-CM | POA: Diagnosis not present

## 2024-05-18 DIAGNOSIS — J4489 Other specified chronic obstructive pulmonary disease: Secondary | ICD-10-CM | POA: Diagnosis not present

## 2024-05-18 DIAGNOSIS — L03116 Cellulitis of left lower limb: Secondary | ICD-10-CM | POA: Diagnosis not present

## 2024-05-18 DIAGNOSIS — I272 Pulmonary hypertension, unspecified: Secondary | ICD-10-CM | POA: Diagnosis not present

## 2024-05-18 DIAGNOSIS — Z79899 Other long term (current) drug therapy: Secondary | ICD-10-CM | POA: Diagnosis not present

## 2024-05-18 DIAGNOSIS — Z9981 Dependence on supplemental oxygen: Secondary | ICD-10-CM | POA: Diagnosis not present

## 2024-05-18 DIAGNOSIS — E119 Type 2 diabetes mellitus without complications: Secondary | ICD-10-CM | POA: Diagnosis not present

## 2024-05-18 DIAGNOSIS — Z9181 History of falling: Secondary | ICD-10-CM | POA: Diagnosis not present

## 2024-05-18 DIAGNOSIS — I110 Hypertensive heart disease with heart failure: Secondary | ICD-10-CM | POA: Diagnosis not present

## 2024-05-18 DIAGNOSIS — I428 Other cardiomyopathies: Secondary | ICD-10-CM | POA: Diagnosis not present

## 2024-05-18 DIAGNOSIS — I2510 Atherosclerotic heart disease of native coronary artery without angina pectoris: Secondary | ICD-10-CM | POA: Diagnosis not present

## 2024-05-18 DIAGNOSIS — I5022 Chronic systolic (congestive) heart failure: Secondary | ICD-10-CM | POA: Diagnosis not present

## 2024-05-18 DIAGNOSIS — Z7901 Long term (current) use of anticoagulants: Secondary | ICD-10-CM | POA: Diagnosis not present

## 2024-05-18 DIAGNOSIS — M48061 Spinal stenosis, lumbar region without neurogenic claudication: Secondary | ICD-10-CM | POA: Diagnosis not present

## 2024-05-18 DIAGNOSIS — I890 Lymphedema, not elsewhere classified: Secondary | ICD-10-CM | POA: Diagnosis not present

## 2024-05-18 DIAGNOSIS — Z8673 Personal history of transient ischemic attack (TIA), and cerebral infarction without residual deficits: Secondary | ICD-10-CM | POA: Diagnosis not present

## 2024-05-19 ENCOUNTER — Telehealth: Payer: Self-pay | Admitting: Nurse Practitioner

## 2024-05-19 DIAGNOSIS — I5022 Chronic systolic (congestive) heart failure: Secondary | ICD-10-CM | POA: Diagnosis not present

## 2024-05-19 DIAGNOSIS — I2510 Atherosclerotic heart disease of native coronary artery without angina pectoris: Secondary | ICD-10-CM | POA: Diagnosis not present

## 2024-05-19 DIAGNOSIS — I4891 Unspecified atrial fibrillation: Secondary | ICD-10-CM | POA: Diagnosis not present

## 2024-05-19 NOTE — Telephone Encounter (Signed)
Lvm to schedule hospital follow up-Toni ?

## 2024-05-19 NOTE — Telephone Encounter (Signed)
 Copied from CRM [id number]. Topic: Clinical - Medication Question >> May 19, 2024 11:30 AM Juliana Ocean wrote: Reason for CRM: Laura [name] w/ [facility] w/ Lawson Prey calling to ask if the dr will change the duoneb to a more long acting one.  The pt is asking for this change. Pt's insurance will pay for whichever one the dr will approve.  They are: 1.Brovana  2. Yupelri 3. Albuterol  for rescue Laura [name] direct line: Cb [id number]  option 5  Please call Laura [name] and she will send the dr something to sign if approved (which is the easiest)

## 2024-05-20 ENCOUNTER — Telehealth: Payer: Self-pay

## 2024-05-20 ENCOUNTER — Telehealth: Payer: Self-pay | Admitting: Nurse Practitioner

## 2024-05-20 NOTE — Telephone Encounter (Signed)
 Gave verbal to [facility] to tiffany [phone] for continuation care for Nursing and Physical therapy

## 2024-05-20 NOTE — Telephone Encounter (Signed)
 Please advise on med change for pt as I think this was sent to the wrong pulmonary office

## 2024-05-20 NOTE — Telephone Encounter (Signed)
 I aplogize, pt does not have appt w/  Pulm until 06/13/2024.  This should have been sent to [facility].   I called [facility], spoke to the pharmacy and advised this pt is not our pt yet.  Needs to be sent to [facility].  Pharmacy verbalized understanding, and stated the info is all they needed.  Nothing further needed.  Again, my apologies.

## 2024-05-20 NOTE — Telephone Encounter (Signed)
 Lvmto schedule f32f appointment for surgical clearance-Toni

## 2024-05-21 ENCOUNTER — Other Ambulatory Visit: Payer: Self-pay | Admitting: Nurse Practitioner

## 2024-05-23 DIAGNOSIS — R0600 Dyspnea, unspecified: Secondary | ICD-10-CM | POA: Diagnosis not present

## 2024-05-24 ENCOUNTER — Telehealth: Admitting: Nurse Practitioner

## 2024-05-24 ENCOUNTER — Encounter: Payer: Self-pay | Admitting: Nurse Practitioner

## 2024-05-24 VITALS — BP 114/72 | Resp 16 | Wt 220.0 lb

## 2024-05-24 DIAGNOSIS — R531 Weakness: Secondary | ICD-10-CM

## 2024-05-24 DIAGNOSIS — I5022 Chronic systolic (congestive) heart failure: Secondary | ICD-10-CM | POA: Diagnosis not present

## 2024-05-24 DIAGNOSIS — E1159 Type 2 diabetes mellitus with other circulatory complications: Secondary | ICD-10-CM

## 2024-05-24 DIAGNOSIS — J449 Chronic obstructive pulmonary disease, unspecified: Secondary | ICD-10-CM

## 2024-05-24 DIAGNOSIS — R262 Difficulty in walking, not elsewhere classified: Secondary | ICD-10-CM

## 2024-05-24 DIAGNOSIS — Z09 Encounter for follow-up examination after completed treatment for conditions other than malignant neoplasm: Secondary | ICD-10-CM

## 2024-05-24 DIAGNOSIS — I482 Chronic atrial fibrillation, unspecified: Secondary | ICD-10-CM | POA: Diagnosis not present

## 2024-05-24 NOTE — Progress Notes (Signed)
 [facility] [place], [place] [address] [place] [address] [phone]                                   Transitional Care Clinic   [facility] Discharge Acute Issues Care Follow Up                                                                        Patient Demographics  Laura [name], is a 73 y.o. female  DOB 09-25-1951  MRN [id number].  Primary MD  [name], NP  I connected with the patient at 1610 by telephone and verified the patients identity using two identifiers.   I discussed the limitations, risks, security and privacy concerns of performing an evaluation and management service by telephone and the availability of in person appointments. I also discussed with the patient that there may be a patient responsible charge related to the service.  The patient expressed understanding and agrees to proceed.    Admit date: 05/07/2024 Discharge date: 05/13/2024  Reason for TCC follow Up - AFIB with RVR   Past Medical History:  Diagnosis Date   Asthma    Chronic atrial fibrillation (HCC)    a. diagnosed in 09/2016; b. failed flecainide and propafenone due to LE swelling and SOB, could not afford Multaq; c. CHADS2VASc => 5 (CHF, HTN, age x 1, nonobs CAD, female)--> Eliquis    COPD (chronic obstructive pulmonary disease) (HCC)    GERD (gastroesophageal reflux disease)    HFmrEF (heart failure with mid-range ejection fraction) (HCC)    a. 12/2019 Echo: EF 40-45%. b. 01/2022 Echo: EF 40-45%.   Hyperlipidemia    Hypertension    NICM (nonischemic cardiomyopathy) (HCC)    a. 12/2019 Echo: EF 40-45%, glob HK, mildly reduced RV fxn, sev dil LA. *HR 130 (afib) during study.   Nonobstructive CAD (coronary artery disease)    a. Lexiscan Myoview 10/2016: no evidence of ischemia, EF 53%; b. 02/2020 Cath: LM nl, LAD 20p, 54m, LCX 20ost, OM1/2/3 nl, LPDA nl, LPL1/2 nl, LPAV nl, RCA small, nl.   Obesity    Obstructive sleep apnea    Pain in joint of right  hip 01/11/2024   Pain of right sacroiliac joint 01/11/2024   Pulmonary hypertension (HCC)    Sacrococcygeal disorders, not elsewhere classified 01/11/2024   Sleep apnea    Spinal stenosis of lumbar region 02/03/2024   Stroke (HCC)    Systolic dysfunction    a. TTE 10/2016: EF 50%, mild LVH, moderately dilated LA, moderate MR/TR, mild pulmonary hypertension    Past Surgical History:  Procedure Laterality Date   BOWEL RESECTION  09/11/2019   Procedure: SMALL BOWEL RESECTION;  Surgeon: Eldred Grego, MD;  Location: [facility] ORS;  Service: General;;   CARDIAC CATHETERIZATION     cataract surgery     COLONOSCOPY WITH PROPOFOL  N/A 03/19/2020   Procedure: COLONOSCOPY WITH PROPOFOL ;  Surgeon: [name], MD;  Location: [facility] ENDOSCOPY;  Service: Gastroenterology;  Laterality: N/A;   CORONARY ANGIOPLASTY     INCISION AND DRAINAGE ABSCESS Right 06/29/2016   Procedure: INCISION AND DRAINAGE ABSCESS;  Surgeon: Claudia Cuff, MD;  Location:  [facility] ORS;  Service: General;  Laterality: Right;   INCISION AND DRAINAGE OF WOUND Left 06/29/2016   Procedure: IRRIGATION AND DEBRIDEMENT WOUND;  Surgeon: Claudia Cuff, MD;  Location: [facility] ORS;  Service: General;  Laterality: Left;   IR ANGIO VERTEBRAL SEL SUBCLAVIAN INNOMINATE UNI R MOD SED  11/20/2020   IR CT HEAD LTD  11/20/2020   IR PERCUTANEOUS ART THROMBECTOMY/INFUSION INTRACRANIAL INC DIAG ANGIO  11/20/2020   LAPAROSCOPIC RIGHT COLECTOMY  09/11/2019   Procedure: RIGHT COLECTOMY;  Surgeon: Eldred Grego, MD;  Location: [facility] ORS;  Service: General;;   LAPAROSCOPY N/A 09/11/2019   Procedure: LAPAROSCOPY DIAGNOSTIC;  Surgeon: Eldred Grego, MD;  Location: [facility] ORS;  Service: General;  Laterality: N/A;   LAPAROTOMY N/A 09/13/2019   Procedure: REOPENING OF RECENT LAPAROTOMYANASTOMOSIS OF BOWEL;  Surgeon: Eldred Grego, MD;  Location: [facility] ORS;  Service: General;  Laterality: N/A;   RADIOLOGY WITH ANESTHESIA N/A 11/20/2020    Procedure: IR WITH ANESTHESIA - CODE STROKE;  Surgeon: Radiologist, Medication, MD;  Location: [facility] OR;  Service: Radiology;  Laterality: N/A;   RIGHT/LEFT HEART CATH AND CORONARY ANGIOGRAPHY Bilateral 02/27/2020   Procedure: RIGHT/LEFT HEART CATH AND CORONARY ANGIOGRAPHY;  Surgeon: Wenona Hamilton, MD;  Location: [facility] INVASIVE CV LAB;  Service: Cardiovascular;  Laterality: Bilateral;   VISCERAL ANGIOGRAPHY N/A 09/12/2019   Procedure: VISCERAL ANGIOGRAPHY;  Surgeon: Celso College, MD;  Location: [facility] INVASIVE CV LAB;  Service: Cardiovascular;  Laterality: N/A;       Recent HPI and Hospital Course  Ms. Cawthorn is a 73 year old woman with past medical history notable for HFmREF (EF 35-40%), COPD, Afib with RVR, and chronic prednisone  use who presented in the setting of a weeks-long history of dyspnea on exertion. She was also found on presentation to hypotensive in the setting of both self-discontinuing her home prednisone  a week prior (had been on 10 mg prednisone  daily for decades before this), in addition to her GDMT for her heart failure which on admission included entresto  13-12 mg BID, spironolactone  12.5 mg daily, Jardiance  10 mg daily, and Metoprolol  succinate 25 mg BID.   During her hospitalization cardiology was consulted and her GDMT was held, and later advanced with improvement in her blood pressures and she was ultimately discharged on Metoprolol  succinate 75 mg BID, spironolactone  12.5 mg daily, Jardiance  10 mg daily. Her home diuresis was held in the setting of mild hypernatremia and improved to 149 on discharge. Patient was advised to measure her weights every day and take her lasix  if she gains >5 lbs, and had follow up scheduled with [facility] cardiology on 06/02 at time of discharge.  She was also treated empirically for community acquired pneumonia in the setting of an increased oxygen  requirement of 3L at rest, which was weaned to room air at rest. For context, patient currently uses 3L [place] at  home as needed for comfort. She was started on triple therapy given her history COPD and also expressed interest in transferring her pulmonology care to [facility] for continuity. She was also referred to [facility] medicine to establish care at their [place] office as a new PCP.   Post Hospital Acute Care Issue to be followed in the Clinic   Principal Problem: Atrial fibrillation with rapid ventricular response (POA: Yes) Active Problems: Asthma (HHS-HCC) (POA: Yes) Hypoxia (POA: Yes) Atherosclerosis of both carotid arteries (POA: Yes) Benign essential HTN (POA: Yes) Chronic respiratory failure with hypoxia (POA: Yes) COPD (chronic obstructive pulmonary disease) (POA: Yes) Gastro-esophageal reflux disease  without esophagitis (POA: Yes) Generalized anxiety disorder (POA: Yes) Diabetes mellitus (POA: Yes) Hyperlipidemia LDL goal <70 (POA: Yes) NICM (nonischemic cardiomyopathy) (POA: Yes) OSA on CPAP (POA: Yes) PAD (peripheral artery disease) (CMS-HCC) (POA: Yes) Resolved Problems: * No resolved hospital problems. *    Subjective:   Laura [name] today has, No headache, No chest pain, No abdominal pain - No Nausea, No new weakness tingling or numbness, No Cough - SOB.   Assessment & Plan   1. Hospital discharge follow-up (Primary) Treated for AFIB with RVR in hospital, doing much better now  2. Chronic atrial fibrillation (HCC) On eliquis  and metoprolol    3. Chronic systolic heart failure (HCC) Seeing [facility] cardiology   4. Chronic obstructive pulmonary disease, unspecified COPD type (HCC) Seeing [facility] pulmonology now.   5. Type 2 diabetes mellitus with other circulatory complication, without long-term current use of insulin  (HCC) Controlled, last A1c was in December at 6.0. kept on ozempic  by cardiology  6. Generalized weakness Has physical therapy coming into the home   7. Ambulatory dysfunction Has physical therapy coming into the home     Reason for frequent  admissions/ER visits    heart failure Diabetes Hypertension COPD    Objective:   Vitals:   05/24/24 1527  BP: 114/72  Resp: 16  Weight: 220 lb (99.8 kg)    Wt Readings from Last 3 Encounters:  05/24/24 220 lb (99.8 kg)  04/21/24 230 lb 9.6 oz (104.6 kg)  04/14/24 218 lb (98.9 kg)    Allergies as of 05/24/2024       Reactions   Flecainide Shortness Of Breath   Propafenone Shortness Of Breath, Swelling, Other (See Comments)   "My limbs swell" also   Rivaroxaban Swelling, Other (See Comments)   Xarelto- "My limbs swell" Other reaction(s): Muscle Pain, Other (See Comments) Other reaction(s): Other (See Comments) Xarelto- "My limbs swell"   Tirzepatide  Diarrhea, Nausea And Vomiting   Severe adverse side effects of mounjaro         Medication List        Accurate as of May 24, 2024 11:59 PM. If you have any questions, ask your nurse or doctor.          STOP taking these medications    Entresto  24-26 MG Generic drug: sacubitril-valsartan Stopped by: [name]       TAKE these medications    Accu-Chek Guide test strip Generic drug: glucose blood Use as instructed to check blood sugars daily 2 hours after meal DX E11.65   Accu-Chek Softclix Lancets lancets Accu-Chek Softclix Lancets  USE AS INSTRUCTED TO CHECK BLOOD SUGARS DAILY 2 HOURS AFTER MEAL   ALPRAZolam  0.25 MG tablet Commonly known as: XANAX  TAKE 1 TABLET BY MOUTH TWICE A DAY AS NEEDED FOR ANXIETY   Combivent Respimat 20-100 MCG/ACT Aers respimat Generic drug: Ipratropium-Albuterol  SMARTSIG:1 Puff(s) Via Inhaler 4 Times Daily PRN   Eliquis  5 MG Tabs tablet Generic drug: apixaban  TAKE 1 TABLET BY MOUTH TWICE A DAY   EPINEPHrine  0.3 mg/0.3 mL Soaj injection Commonly known as: EPI-PEN Inject 0.3 mg into the muscle as needed for anaphylaxis.   famotidine  40 MG tablet Commonly known as: PEPCID  Take 1 tablet (40 mg total) by mouth at bedtime.   fluticasone  50 MCG/ACT nasal  spray Commonly known as: FLONASE Place 1 spray into the nose daily.   furosemide  20 MG tablet Commonly known as: LASIX  Take 1 tablet (20 mg total) by mouth daily.   Jardiance  10 MG Tabs tablet  Generic drug: empagliflozin  TAKE 1 TABLET BY MOUTH DAILY BEFORE BREAKFAST.   loperamide 2 MG capsule Commonly known as: IMODIUM Take 4 mg by mouth as needed for diarrhea or loose stools.   metoprolol  succinate 25 MG 24 hr tablet Commonly known as: TOPROL -XL Take 1 tablet (25 mg total) by mouth in the morning and at bedtime.   montelukast  10 MG tablet Commonly known as: SINGULAIR  TAKE 1 TABLET BY MOUTH EVERY DAY   Ozempic  (2 MG/DOSE) 8 MG/3ML Sopn Generic drug: Semaglutide  (2 MG/DOSE) Inject 2 mg into the skin once a week.   Probiotic-10 Caps Take 1 capsule by mouth daily.   promethazine  25 MG tablet Commonly known as: PHENERGAN  Take 1 tablet (25 mg total) by mouth every 8 (eight) hours as needed for nausea or vomiting.   rosuvastatin  20 MG tablet Commonly known as: CRESTOR  TAKE 1 TABLET BY MOUTH EVERY DAY   spironolactone  25 MG tablet Commonly known as: ALDACTONE  TAKE 1/2 TABLET BY MOUTH EVERY DAY   traMADol  50 MG tablet Commonly known as: ULTRAM  Take 50 mg by mouth 4 (four) times daily as needed.         Objective Data: she is alert and oriented. No acute distress noted.   Data Review   Micro Results No results found for this or any previous visit (from the past 240 hours).   CBC No results for input(s): "WBC", "HGB", "HCT", "PLT", "MCV", "MCH", "MCHC", "RDW", "LYMPHSABS", "MONOABS", "EOSABS", "BASOSABS", "BANDABS" in the last 168 hours.  Invalid input(s): "NEUTRABS", "BANDSABD"  Chemistries  No results for input(s): "NA", "K", "CL", "CO2", "GLUCOSE", "BUN", "CREATININE", "CALCIUM ", "MG", "AST", "ALT", "ALKPHOS", "BILITOT" in the last 168 hours.  Invalid input(s):  "GFRCGP" ------------------------------------------------------------------------------------------------------------------ CrCl cannot be calculated (Patient's most recent lab result is older than the maximum 21 days allowed.). ------------------------------------------------------------------------------------------------------------------ No results for input(s): "HGBA1C" in the last 72 hours. ------------------------------------------------------------------------------------------------------------------ No results for input(s): "CHOL", "HDL", "LDLCALC", "TRIG", "CHOLHDL", "LDLDIRECT" in the last 72 hours. ------------------------------------------------------------------------------------------------------------------ No results for input(s): "TSH", "T4TOTAL", "T3FREE", "THYROIDAB" in the last 72 hours.  Invalid input(s): "FREET3" ------------------------------------------------------------------------------------------------------------------ No results for input(s): "VITAMINB12", "FOLATE", "FERRITIN", "TIBC", "IRON", "RETICCTPCT" in the last 72 hours.  Coagulation profile No results for input(s): "INR", "PROTIME" in the last 168 hours.  No results for input(s): "DDIMER" in the last 72 hours.  Cardiac Enzymes No results for input(s): "CKMB", "TROPONINI", "MYOGLOBIN" in the last 168 hours.  Invalid input(s): "CK" ------------------------------------------------------------------------------------------------------------------ Invalid input(s): "POCBNP"  Return for F/U, Rayshaun Needle PCP, Recheck A1C.   Time Spent in minutes  45 Time spent with patient included reviewing progress notes, labs, imaging studies, and discussing plan for follow up.   This patient was seen by [name], FNP-C in collaboration with Dr. Verneta Gone as a part of collaborative care agreement.    [name] MSN, FNP-C on 05/24/2024 at 4:05 PM   **Disclaimer: This note may have been dictated with  voice recognition software. Similar sounding words can inadvertently be transcribed and this note may contain transcription errors which may not have been corrected upon publication of note.**

## 2024-05-25 DIAGNOSIS — L03116 Cellulitis of left lower limb: Secondary | ICD-10-CM | POA: Diagnosis not present

## 2024-05-25 DIAGNOSIS — I110 Hypertensive heart disease with heart failure: Secondary | ICD-10-CM | POA: Diagnosis not present

## 2024-05-25 DIAGNOSIS — I482 Chronic atrial fibrillation, unspecified: Secondary | ICD-10-CM | POA: Diagnosis not present

## 2024-05-25 DIAGNOSIS — E119 Type 2 diabetes mellitus without complications: Secondary | ICD-10-CM | POA: Diagnosis not present

## 2024-05-25 DIAGNOSIS — I5022 Chronic systolic (congestive) heart failure: Secondary | ICD-10-CM | POA: Diagnosis not present

## 2024-05-25 DIAGNOSIS — I272 Pulmonary hypertension, unspecified: Secondary | ICD-10-CM | POA: Diagnosis not present

## 2024-05-26 ENCOUNTER — Other Ambulatory Visit: Payer: Self-pay | Admitting: Nurse Practitioner

## 2024-05-27 ENCOUNTER — Telehealth: Payer: Self-pay

## 2024-05-27 DIAGNOSIS — L03116 Cellulitis of left lower limb: Secondary | ICD-10-CM | POA: Diagnosis not present

## 2024-05-27 DIAGNOSIS — I272 Pulmonary hypertension, unspecified: Secondary | ICD-10-CM | POA: Diagnosis not present

## 2024-05-27 DIAGNOSIS — I482 Chronic atrial fibrillation, unspecified: Secondary | ICD-10-CM | POA: Diagnosis not present

## 2024-05-27 DIAGNOSIS — E119 Type 2 diabetes mellitus without complications: Secondary | ICD-10-CM | POA: Diagnosis not present

## 2024-05-27 DIAGNOSIS — I5022 Chronic systolic (congestive) heart failure: Secondary | ICD-10-CM | POA: Diagnosis not present

## 2024-05-27 DIAGNOSIS — I110 Hypertensive heart disease with heart failure: Secondary | ICD-10-CM | POA: Diagnosis not present

## 2024-05-27 NOTE — Telephone Encounter (Signed)
 Jatana from [facility] called asking for verbal order for physical therapy 1 week 1 and 2 week 2. Verbal orders were given. [phone]

## 2024-05-28 ENCOUNTER — Encounter: Payer: Self-pay | Admitting: Podiatry

## 2024-05-29 ENCOUNTER — Encounter: Payer: Self-pay | Admitting: Nurse Practitioner

## 2024-05-30 ENCOUNTER — Encounter: Payer: Self-pay | Admitting: Nurse Practitioner

## 2024-05-30 DIAGNOSIS — E119 Type 2 diabetes mellitus without complications: Secondary | ICD-10-CM | POA: Diagnosis not present

## 2024-05-30 DIAGNOSIS — I272 Pulmonary hypertension, unspecified: Secondary | ICD-10-CM | POA: Diagnosis not present

## 2024-05-30 DIAGNOSIS — I482 Chronic atrial fibrillation, unspecified: Secondary | ICD-10-CM | POA: Diagnosis not present

## 2024-05-30 DIAGNOSIS — I5022 Chronic systolic (congestive) heart failure: Secondary | ICD-10-CM | POA: Diagnosis not present

## 2024-05-30 DIAGNOSIS — I110 Hypertensive heart disease with heart failure: Secondary | ICD-10-CM | POA: Diagnosis not present

## 2024-05-30 DIAGNOSIS — L03116 Cellulitis of left lower limb: Secondary | ICD-10-CM | POA: Diagnosis not present

## 2024-05-30 MED ORDER — FLUCONAZOLE 150 MG PO TABS: 150.0000 mg | ORAL_TABLET | Freq: Once | ORAL | 0 refills | Status: AC

## 2024-05-31 DIAGNOSIS — I5022 Chronic systolic (congestive) heart failure: Secondary | ICD-10-CM | POA: Diagnosis not present

## 2024-05-31 DIAGNOSIS — L03116 Cellulitis of left lower limb: Secondary | ICD-10-CM | POA: Diagnosis not present

## 2024-05-31 DIAGNOSIS — E119 Type 2 diabetes mellitus without complications: Secondary | ICD-10-CM | POA: Diagnosis not present

## 2024-05-31 DIAGNOSIS — I272 Pulmonary hypertension, unspecified: Secondary | ICD-10-CM | POA: Diagnosis not present

## 2024-05-31 DIAGNOSIS — I110 Hypertensive heart disease with heart failure: Secondary | ICD-10-CM | POA: Diagnosis not present

## 2024-05-31 DIAGNOSIS — I482 Chronic atrial fibrillation, unspecified: Secondary | ICD-10-CM | POA: Diagnosis not present

## 2024-06-01 DIAGNOSIS — I482 Chronic atrial fibrillation, unspecified: Secondary | ICD-10-CM | POA: Diagnosis not present

## 2024-06-01 DIAGNOSIS — I5022 Chronic systolic (congestive) heart failure: Secondary | ICD-10-CM | POA: Diagnosis not present

## 2024-06-01 DIAGNOSIS — I110 Hypertensive heart disease with heart failure: Secondary | ICD-10-CM | POA: Diagnosis not present

## 2024-06-01 DIAGNOSIS — L03116 Cellulitis of left lower limb: Secondary | ICD-10-CM | POA: Diagnosis not present

## 2024-06-01 DIAGNOSIS — E119 Type 2 diabetes mellitus without complications: Secondary | ICD-10-CM | POA: Diagnosis not present

## 2024-06-01 DIAGNOSIS — I272 Pulmonary hypertension, unspecified: Secondary | ICD-10-CM | POA: Diagnosis not present

## 2024-06-06 DIAGNOSIS — I110 Hypertensive heart disease with heart failure: Secondary | ICD-10-CM | POA: Diagnosis not present

## 2024-06-06 DIAGNOSIS — E119 Type 2 diabetes mellitus without complications: Secondary | ICD-10-CM | POA: Diagnosis not present

## 2024-06-06 DIAGNOSIS — I482 Chronic atrial fibrillation, unspecified: Secondary | ICD-10-CM | POA: Diagnosis not present

## 2024-06-06 DIAGNOSIS — I5022 Chronic systolic (congestive) heart failure: Secondary | ICD-10-CM | POA: Diagnosis not present

## 2024-06-06 DIAGNOSIS — L03116 Cellulitis of left lower limb: Secondary | ICD-10-CM | POA: Diagnosis not present

## 2024-06-06 DIAGNOSIS — I272 Pulmonary hypertension, unspecified: Secondary | ICD-10-CM | POA: Diagnosis not present

## 2024-06-07 DIAGNOSIS — E119 Type 2 diabetes mellitus without complications: Secondary | ICD-10-CM | POA: Diagnosis not present

## 2024-06-07 DIAGNOSIS — I110 Hypertensive heart disease with heart failure: Secondary | ICD-10-CM | POA: Diagnosis not present

## 2024-06-07 DIAGNOSIS — I5022 Chronic systolic (congestive) heart failure: Secondary | ICD-10-CM | POA: Diagnosis not present

## 2024-06-07 DIAGNOSIS — I482 Chronic atrial fibrillation, unspecified: Secondary | ICD-10-CM | POA: Diagnosis not present

## 2024-06-07 DIAGNOSIS — I272 Pulmonary hypertension, unspecified: Secondary | ICD-10-CM | POA: Diagnosis not present

## 2024-06-07 DIAGNOSIS — L03116 Cellulitis of left lower limb: Secondary | ICD-10-CM | POA: Diagnosis not present

## 2024-06-13 ENCOUNTER — Ambulatory Visit: Admitting: Pulmonary Disease

## 2024-06-14 ENCOUNTER — Ambulatory Visit: Admitting: Nurse Practitioner

## 2024-06-16 ENCOUNTER — Telehealth: Payer: Self-pay

## 2024-06-16 NOTE — Telephone Encounter (Signed)
 Gave verbal order as per alyssa  to [facility] to tiffany [phone] until pt had appt with [facility] for nursing for 3 weeks

## 2024-06-21 ENCOUNTER — Ambulatory Visit (INDEPENDENT_AMBULATORY_CARE_PROVIDER_SITE_OTHER): Payer: Medicare Other | Admitting: Podiatry

## 2024-06-21 ENCOUNTER — Encounter: Payer: Self-pay | Admitting: Podiatry

## 2024-06-21 VITALS — Ht 64.0 in | Wt 220.0 lb

## 2024-06-21 DIAGNOSIS — B351 Tinea unguium: Secondary | ICD-10-CM | POA: Diagnosis not present

## 2024-06-21 DIAGNOSIS — M79674 Pain in right toe(s): Secondary | ICD-10-CM

## 2024-06-21 DIAGNOSIS — M79675 Pain in left toe(s): Secondary | ICD-10-CM

## 2024-06-21 NOTE — Progress Notes (Signed)
 Chief Complaint  Patient presents with   Nail Problem    Dfc.    SUBJECTIVE Patient presents to office today complaining of elongated, thickened nails that cause pain while ambulating in shoes.  Patient is unable to trim their own nails. Patient is here for further evaluation and treatment.  Past Medical History:  Diagnosis Date   Asthma    Chronic atrial fibrillation (HCC)    a. diagnosed in 09/2016; b. failed flecainide and propafenone due to LE swelling and SOB, could not afford Multaq; c. CHADS2VASc => 5 (CHF, HTN, age x 1, nonobs CAD, female)--> Eliquis    COPD (chronic obstructive pulmonary disease) (HCC)    GERD (gastroesophageal reflux disease)    HFmrEF (heart failure with mid-range ejection fraction) (HCC)    a. 12/2019 Echo: EF 40-45%. b. 01/2022 Echo: EF 40-45%.   Hyperlipidemia    Hypertension    NICM (nonischemic cardiomyopathy) (HCC)    a. 12/2019 Echo: EF 40-45%, glob HK, mildly reduced RV fxn, sev dil LA. *HR 130 (afib) during study.   Nonobstructive CAD (coronary artery disease)    a. Lexiscan Myoview 10/2016: no evidence of ischemia, EF 53%; b. 02/2020 Cath: LM nl, LAD 20p, 41m, LCX 20ost, OM1/2/3 nl, LPDA nl, LPL1/2 nl, LPAV nl, RCA small, nl.   Obesity    Obstructive sleep apnea    Pain in joint of right hip 01/11/2024   Pain of right sacroiliac joint 01/11/2024   Pulmonary hypertension (HCC)    Sacrococcygeal disorders, not elsewhere classified 01/11/2024   Sleep apnea    Spinal stenosis of lumbar region 02/03/2024   Stroke (HCC)    Systolic dysfunction    a. TTE 10/2016: EF 50%, mild LVH, moderately dilated LA, moderate MR/TR, mild pulmonary hypertension    Allergies  Allergen Reactions   Flecainide Shortness Of Breath   Propafenone Shortness Of Breath, Swelling and Other (See Comments)    My limbs swell also   Rivaroxaban Swelling and Other (See Comments)    Xarelto- My limbs swell Other reaction(s): Muscle Pain, Other (See Comments) Other  reaction(s): Other (See Comments) Xarelto- My limbs swell   Tirzepatide  Diarrhea and Nausea And Vomiting    Severe adverse side effects of mounjaro      OBJECTIVE General Patient is awake, alert, and oriented x 3 and in no acute distress. Derm Skin is dry and supple bilateral. Negative open lesions or macerations. Remaining integument unremarkable. Nails are tender, long, thickened and dystrophic with subungual debris, consistent with onychomycosis, 1-5 bilateral. No signs of infection noted. Vasc  DP and PT pedal pulses palpable bilaterally. Temperature gradient within normal limits.  Neuro Epicritic and protective threshold sensation grossly intact bilaterally.  Musculoskeletal Exam No symptomatic pedal deformities noted bilateral. Muscular strength within normal limits.  ASSESSMENT 1.  Pain due to onychomycosis of toenails both 2.  Ingrown toenail lateral aspect of the right great toe  PLAN OF CARE 1. Patient evaluated today.  2. Instructed to maintain good pedal hygiene and foot care.  3. Mechanical debridement of nails 1-5 bilaterally performed using a nail nipper. Filed with dremel without incident.  Patient felt significant relief of the ingrown 4. Return to clinic in 3 mos.    Thresa EMERSON Sar, DPM [facility]  Dr. Thresa EMERSON Sar, DPM    [address]  [place], [place] [address]                Office [phone]  Fax [phone]

## 2024-06-28 ENCOUNTER — Ambulatory Visit: Payer: Medicare Other | Admitting: Podiatry

## 2024-07-06 ENCOUNTER — Other Ambulatory Visit

## 2024-07-06 DIAGNOSIS — Z1159 Encounter for screening for other viral diseases: Secondary | ICD-10-CM | POA: Diagnosis not present

## 2024-07-06 DIAGNOSIS — I428 Other cardiomyopathies: Secondary | ICD-10-CM | POA: Diagnosis not present

## 2024-07-06 DIAGNOSIS — M818 Other osteoporosis without current pathological fracture: Secondary | ICD-10-CM | POA: Diagnosis not present

## 2024-07-06 DIAGNOSIS — I6523 Occlusion and stenosis of bilateral carotid arteries: Secondary | ICD-10-CM | POA: Diagnosis not present

## 2024-07-06 DIAGNOSIS — E119 Type 2 diabetes mellitus without complications: Secondary | ICD-10-CM | POA: Diagnosis not present

## 2024-07-06 DIAGNOSIS — Z1231 Encounter for screening mammogram for malignant neoplasm of breast: Secondary | ICD-10-CM | POA: Diagnosis not present

## 2024-07-06 DIAGNOSIS — Z114 Encounter for screening for human immunodeficiency virus [HIV]: Secondary | ICD-10-CM | POA: Diagnosis not present

## 2024-07-08 ENCOUNTER — Ambulatory Visit: Admitting: Nurse Practitioner

## 2024-07-13 DIAGNOSIS — M1611 Unilateral primary osteoarthritis, right hip: Secondary | ICD-10-CM | POA: Diagnosis not present

## 2024-07-16 ENCOUNTER — Other Ambulatory Visit: Payer: Self-pay | Admitting: Cardiovascular Disease

## 2024-07-16 ENCOUNTER — Other Ambulatory Visit: Payer: Self-pay | Admitting: Family

## 2024-07-16 DIAGNOSIS — I5022 Chronic systolic (congestive) heart failure: Secondary | ICD-10-CM

## 2024-07-17 DIAGNOSIS — I509 Heart failure, unspecified: Secondary | ICD-10-CM | POA: Diagnosis not present

## 2024-07-17 DIAGNOSIS — Z79899 Other long term (current) drug therapy: Secondary | ICD-10-CM | POA: Diagnosis not present

## 2024-07-17 DIAGNOSIS — I10 Essential (primary) hypertension: Secondary | ICD-10-CM | POA: Diagnosis not present

## 2024-07-17 DIAGNOSIS — Z7983 Long term (current) use of bisphosphonates: Secondary | ICD-10-CM | POA: Diagnosis not present

## 2024-07-17 DIAGNOSIS — E119 Type 2 diabetes mellitus without complications: Secondary | ICD-10-CM | POA: Diagnosis not present

## 2024-07-17 DIAGNOSIS — K219 Gastro-esophageal reflux disease without esophagitis: Secondary | ICD-10-CM | POA: Diagnosis not present

## 2024-07-17 DIAGNOSIS — Z7951 Long term (current) use of inhaled steroids: Secondary | ICD-10-CM | POA: Diagnosis not present

## 2024-07-17 DIAGNOSIS — Z7901 Long term (current) use of anticoagulants: Secondary | ICD-10-CM | POA: Diagnosis not present

## 2024-07-17 DIAGNOSIS — Z7984 Long term (current) use of oral hypoglycemic drugs: Secondary | ICD-10-CM | POA: Diagnosis not present

## 2024-07-17 DIAGNOSIS — Z7985 Long-term (current) use of injectable non-insulin antidiabetic drugs: Secondary | ICD-10-CM | POA: Diagnosis not present

## 2024-07-17 DIAGNOSIS — J449 Chronic obstructive pulmonary disease, unspecified: Secondary | ICD-10-CM | POA: Diagnosis not present

## 2024-07-17 DIAGNOSIS — K5900 Constipation, unspecified: Secondary | ICD-10-CM | POA: Diagnosis not present

## 2024-07-18 DIAGNOSIS — Z01810 Encounter for preprocedural cardiovascular examination: Secondary | ICD-10-CM | POA: Diagnosis not present

## 2024-07-18 DIAGNOSIS — I10 Essential (primary) hypertension: Secondary | ICD-10-CM | POA: Diagnosis not present

## 2024-07-18 DIAGNOSIS — I5022 Chronic systolic (congestive) heart failure: Secondary | ICD-10-CM | POA: Diagnosis not present

## 2024-07-18 DIAGNOSIS — I4891 Unspecified atrial fibrillation: Secondary | ICD-10-CM | POA: Diagnosis not present

## 2024-07-18 DIAGNOSIS — K5900 Constipation, unspecified: Secondary | ICD-10-CM | POA: Diagnosis not present

## 2024-07-18 DIAGNOSIS — E785 Hyperlipidemia, unspecified: Secondary | ICD-10-CM | POA: Diagnosis not present

## 2024-07-20 ENCOUNTER — Other Ambulatory Visit: Payer: Self-pay | Admitting: Orthopedic Surgery

## 2024-07-31 ENCOUNTER — Other Ambulatory Visit: Payer: Self-pay | Admitting: Cardiovascular Disease

## 2024-07-31 DIAGNOSIS — I482 Chronic atrial fibrillation, unspecified: Secondary | ICD-10-CM

## 2024-08-01 ENCOUNTER — Other Ambulatory Visit: Payer: Self-pay

## 2024-08-01 ENCOUNTER — Encounter
Admission: RE | Admit: 2024-08-01 | Discharge: 2024-08-01 | Disposition: A | Discharge status: Home / Self Care | Source: Ambulatory Visit | Attending: Orthopedic Surgery | Admitting: Orthopedic Surgery

## 2024-08-01 VITALS — BP 121/73 | HR 44 | Resp 12 | Ht 64.0 in | Wt 205.6 lb

## 2024-08-01 DIAGNOSIS — M179 Osteoarthritis of knee, unspecified: Secondary | ICD-10-CM

## 2024-08-01 DIAGNOSIS — R8271 Bacteriuria: Secondary | ICD-10-CM | POA: Insufficient documentation

## 2024-08-01 DIAGNOSIS — R8290 Unspecified abnormal findings in urine: Secondary | ICD-10-CM | POA: Diagnosis not present

## 2024-08-01 DIAGNOSIS — Z01812 Encounter for preprocedural laboratory examination: Secondary | ICD-10-CM | POA: Diagnosis not present

## 2024-08-01 DIAGNOSIS — R8281 Pyuria: Secondary | ICD-10-CM | POA: Diagnosis not present

## 2024-08-01 DIAGNOSIS — Z01818 Encounter for other preprocedural examination: Secondary | ICD-10-CM | POA: Diagnosis present

## 2024-08-01 HISTORY — DX: Anxiety disorder, unspecified: F41.9

## 2024-08-01 HISTORY — DX: Family history of other specified conditions: Z84.89

## 2024-08-01 HISTORY — DX: Unilateral primary osteoarthritis, left hip: M16.12

## 2024-08-01 HISTORY — DX: Long term (current) use of anticoagulants: Z79.01

## 2024-08-01 HISTORY — DX: Obstructive sleep apnea (adult) (pediatric): G47.33

## 2024-08-01 HISTORY — DX: Personal history of Methicillin resistant Staphylococcus aureus infection: Z86.14

## 2024-08-01 HISTORY — DX: Type 2 diabetes mellitus without complications: E11.9

## 2024-08-01 HISTORY — DX: Dependence on supplemental oxygen: Z99.81

## 2024-08-01 LAB — CBC WITH DIFFERENTIAL/PLATELET
Abs Immature Granulocytes: 0.02 K/uL (ref 0.00–0.07)
Basophils Absolute: 0 K/uL (ref 0.0–0.1)
Basophils Relative: 1 %
Eosinophils Absolute: 0.1 K/uL (ref 0.0–0.5)
Eosinophils Relative: 1 %
HCT: 44.1 % (ref 36.0–46.0)
Hemoglobin: 14.6 g/dL (ref 12.0–15.0)
Immature Granulocytes: 0 %
Lymphocytes Relative: 38 %
Lymphs Abs: 3 K/uL (ref 0.7–4.0)
MCH: 30.7 pg (ref 26.0–34.0)
MCHC: 33.1 g/dL (ref 30.0–36.0)
MCV: 92.8 fL (ref 80.0–100.0)
Monocytes Absolute: 0.8 K/uL (ref 0.1–1.0)
Monocytes Relative: 10 %
Neutro Abs: 4 K/uL (ref 1.7–7.7)
Neutrophils Relative %: 50 %
Platelets: 287 K/uL (ref 150–400)
RBC: 4.75 MIL/uL (ref 3.87–5.11)
RDW: 14.1 % (ref 11.5–15.5)
WBC: 7.9 K/uL (ref 4.0–10.5)
nRBC: 0 % (ref 0.0–0.2)

## 2024-08-01 LAB — URINALYSIS, ROUTINE W REFLEX MICROSCOPIC
Bilirubin Urine: NEGATIVE
Glucose, UA: >=500 mg/dL — AB
Ketones, ur: NEGATIVE mg/dL
Leukocytes,Ua: NEGATIVE
Nitrite: NEGATIVE
Protein, ur: NEGATIVE mg/dL
Specific Gravity, Urine: 1.033 — ABNORMAL HIGH (ref 1.005–1.030)
pH: 5 (ref 5.0–8.0)

## 2024-08-01 LAB — COMPREHENSIVE METABOLIC PANEL WITH GFR
ALT: 11 U/L (ref 0–44)
AST: 16 U/L (ref 15–41)
Albumin: 3.8 g/dL (ref 3.5–5.0)
Alkaline Phosphatase: 45 U/L (ref 38–126)
Anion gap: 10 (ref 5–15)
BUN: 13 mg/dL (ref 8–23)
CO2: 27 mmol/L (ref 22–32)
Calcium: 9 mg/dL (ref 8.9–10.3)
Chloride: 101 mmol/L (ref 98–111)
Creatinine, Ser: 0.61 mg/dL (ref 0.44–1.00)
GFR, Estimated: >60 mL/min (ref >60)
Glucose, Bld: 98 mg/dL (ref 70–99)
Potassium: 3 mmol/L — ABNORMAL LOW (ref 3.5–5.1)
Sodium: 138 mmol/L (ref 135–145)
Total Bilirubin: 1.5 mg/dL — ABNORMAL HIGH (ref 0.0–1.2)
Total Protein: 6.4 g/dL — ABNORMAL LOW (ref 6.5–8.1)

## 2024-08-01 LAB — TYPE AND SCREEN
ABO/RH(D): A POS
Antibody Screen: NEGATIVE

## 2024-08-01 LAB — SURGICAL PCR SCREEN
MRSA, PCR: NEGATIVE
Staphylococcus aureus: NEGATIVE

## 2024-08-01 NOTE — Telephone Encounter (Signed)
 Prescription refill request for Eliquis  received. Indication: Afib Last office visit: 03/22/24 [place])  Scr: 0.88 (05/13/24)  Age: 73 Weight: 99.8kg  Appropriate dose. Refill sent.

## 2024-08-01 NOTE — Patient Instructions (Addendum)
 Your procedure is scheduled on:08-08-24 Monday Report to the Registration Desk on the 1st floor of the [facility].Then proceed to the 2nd floor Surgery Desk To find out your arrival time, please call [phone] between 1PM - 3PM on:08-05-24 Friday If your arrival time is 6:00 am, do not arrive before that time as the [facility] entrance doors do not open until 6:00 am.  REMEMBER: Instructions that are not followed completely may result in serious medical risk, up to and including death; or upon the discretion of your surgeon and anesthesiologist your surgery may need to be rescheduled.  Do not eat food after midnight the night before surgery.  No gum chewing or hard candies.  You may however, drink Water up to 2 hours before you are scheduled to arrive for your surgery. Do not drink anything within 2 hours of your scheduled arrival time.  In addition, your doctor has ordered for you to drink the provided:  Gatorade G2 Drinking this carbohydrate drink up to two hours before surgery helps to reduce insulin  resistance and improve patient outcomes. Please complete drinking 2 hours before scheduled arrival time.  One week prior to surgery:Stop NOW (08-01-24) Stop Anti-inflammatories (NSAIDS) such as Advil, Aleve, Ibuprofen, Motrin, Naproxen, Naprosyn and Aspirin  based products such as Excedrin, Goody's Powder, BC Powder. Stop ANY OVER THE COUNTER supplements until after surgery  You may however, continue to take Tylenol /Tramadol  if needed for pain up until the day of surgery.  Stop Ozempic  7 days prior to surgery-Do NOT take again until AFTER your surgery  Stop Jardiance  3 days prior to surgery-Last dose will be on 08-04-24 Thursday  Stop Eliquis  2 days prior to surgery-Last dose will be on 08-05-24 Friday  Continue taking all of your other prescription medications up until the day of surgery.  ON THE DAY OF SURGERY ONLY TAKE THESE MEDICATIONS WITH SIPS OF WATER: -metoprolol  succinate  (TOPROL -XL)  -montelukast  (SINGULAIR )  -rosuvastatin  (CRESTOR )  -predniSONE  (DELTASONE )  -You may take ALPRAZolam  (XANAX ) if needed for anxiety  No Alcohol for 24 hours before or after surgery.  No Smoking including e-cigarettes for 24 hours before surgery.  No chewable tobacco products for at least 6 hours before surgery.  No nicotine patches on the day of surgery.  Do not use any recreational drugs for at least a week (preferably 2 weeks) before your surgery.  Please be advised that the combination of cocaine and anesthesia may have negative outcomes, up to and including death. If you test positive for cocaine, your surgery will be cancelled.  On the morning of surgery brush your teeth with toothpaste and water, you may rinse your mouth with mouthwash if you wish. Do not swallow any toothpaste or mouthwash.  Use CHG Soap as directed on instruction sheet.  Do not wear jewelry, make-up, hairpins, clips or nail polish.  For welded (permanent) jewelry: bracelets, anklets, waist bands, etc.  Please have this removed prior to surgery.  If it is not removed, there is a chance that hospital personnel will need to cut it off on the day of surgery.  Do not wear lotions, powders, or perfumes.   Do not shave body hair from the neck down 48 hours before surgery.  Contact lenses, hearing aids and dentures may not be worn into surgery.  Do not bring valuables to the hospital. [facility] is not responsible for any missing/lost belongings or valuables.   Bring your C-PAP to the hospital  Notify your doctor if there is any  change in your medical condition (cold, fever, infection).  Wear comfortable clothing (specific to your surgery type) to the hospital.  After surgery, you can help prevent lung complications by doing breathing exercises.  Take deep breaths and cough every 1-2 hours. Your doctor may order a device called an Incentive Spirometer to help you take deep breaths. When  coughing or sneezing, hold a pillow firmly against your incision with both hands. This is called "splinting." Doing this helps protect your incision. It also decreases belly discomfort.  If you are being admitted to the hospital overnight, leave your suitcase in the car. After surgery it may be brought to your room.  In case of increased patient census, it may be necessary for you, the patient, to continue your postoperative care in the Same Day Surgery department.  If you are being discharged the day of surgery, you will not be allowed to drive home. You will need a responsible individual to drive you home and stay with you for 24 hours after surgery.   If you are taking public transportation, you will need to have a responsible individual with you.  Please call the Pre-admissions Testing Dept. at [phone] if you have any questions about these instructions.  Surgery Visitation Policy:  Patients having surgery or a procedure may have two visitors.  Children under the age of 55 must have an adult with them who is not the patient.  Inpatient Visitation:    Visiting hours are 7 a.m. to 8 p.m. Up to four visitors are allowed at one time in a patient room. The visitors may rotate out with other people during the day.  One visitor age 57 or older may stay with the patient overnight and must be in the room by 8 p.m.    Pre-operative 5 CHG Bath Instructions   You can play a key role in reducing the risk of infection after surgery. Your skin needs to be as free of germs as possible. You can reduce the number of germs on your skin by washing with CHG (chlorhexidine  gluconate) soap before surgery. CHG is an antiseptic soap that kills germs and continues to kill germs even after washing.   DO NOT use if you have an allergy to chlorhexidine /CHG or antibacterial soaps. If your skin becomes reddened or irritated, stop using the CHG and notify one of our RNs at [phone].   Please shower  with the CHG soap starting 4 days before surgery using the following schedule:     Please keep in mind the following:  DO NOT shave, including legs and underarms, starting the day of your first shower.   You may shave your face at any point before/day of surgery.  Place clean sheets on your bed the day you start using CHG soap. Use a clean washcloth (not used since being washed) for each shower. DO NOT sleep with pets once you start using the CHG.   CHG Shower Instructions:  If you choose to wash your hair and private area, wash first with your normal shampoo/soap.  After you use shampoo/soap, rinse your hair and body thoroughly to remove shampoo/soap residue.  Turn the water OFF and apply about 3 tablespoons (45 ml) of CHG soap to a CLEAN washcloth.  Apply CHG soap ONLY FROM YOUR NECK DOWN TO YOUR TOES (washing for 3-5 minutes)  DO NOT use CHG soap on face, private areas, open wounds, or sores.  Pay special attention to the area where your surgery is being  performed.  If you are having back surgery, having someone wash your back for you may be helpful. Wait 2 minutes after CHG soap is applied, then you may rinse off the CHG soap.  Pat dry with a clean towel  Put on clean clothes/pajamas   If you choose to wear lotion, please use ONLY the CHG-compatible lotions on the back of this paper.     Additional instructions for the day of surgery: DO NOT APPLY any lotions, deodorants, cologne, or perfumes.   Put on clean/comfortable clothes.  Brush your teeth.  Ask your nurse before applying any prescription medications to the skin.      CHG Compatible Lotions   Aveeno Moisturizing lotion  Cetaphil Moisturizing Cream  Cetaphil Moisturizing Lotion  Clairol Herbal Essence Moisturizing Lotion, Dry Skin  Clairol Herbal Essence Moisturizing Lotion, Extra Dry Skin  Clairol Herbal Essence Moisturizing Lotion, Normal Skin  Curel Age Defying Therapeutic Moisturizing Lotion with Alpha  Hydroxy  Curel Extreme Care Body Lotion  Curel Soothing Hands Moisturizing Hand Lotion  Curel Therapeutic Moisturizing Cream, Fragrance-Free  Curel Therapeutic Moisturizing Lotion, Fragrance-Free  Curel Therapeutic Moisturizing Lotion, Original Formula  Eucerin Daily Replenishing Lotion  Eucerin Dry Skin Therapy Plus Alpha Hydroxy Crme  Eucerin Dry Skin Therapy Plus Alpha Hydroxy Lotion  Eucerin Original Crme  Eucerin Original Lotion  Eucerin Plus Crme Eucerin Plus Lotion  Eucerin TriLipid Replenishing Lotion  Keri Anti-Bacterial Hand Lotion  Keri Deep Conditioning Original Lotion Dry Skin Formula Softly Scented  Keri Deep Conditioning Original Lotion, Fragrance Free Sensitive Skin Formula  Keri Lotion Fast Absorbing Fragrance Free Sensitive Skin Formula  Keri Lotion Fast Absorbing Softly Scented Dry Skin Formula  Keri Original Lotion  Keri Skin Renewal Lotion Keri Silky Smooth Lotion  Keri Silky Smooth Sensitive Skin Lotion  Nivea Body Creamy Conditioning Oil  Nivea Body Extra Enriched Lotion  Nivea Body Original Lotion  Nivea Body Sheer Moisturizing Lotion Nivea Crme  Nivea Skin Firming Lotion  NutraDerm 30 Skin Lotion  NutraDerm Skin Lotion  NutraDerm Therapeutic Skin Cream  NutraDerm Therapeutic Skin Lotion  ProShield Protective Hand Cream  Provon moisturizing lotion  How to Use an Incentive Spirometer An incentive spirometer is a tool that measures how well you are filling your lungs with each breath. Learning to take long, deep breaths using this tool can help you keep your lungs clear and active. This may help to reverse or lessen your chance of developing breathing (pulmonary) problems, especially infection. You may be asked to use a spirometer: After a surgery. If you have a lung problem or a history of smoking. After a long period of time when you have been unable to move or be active. If the spirometer includes an indicator to show the highest number that you have  reached, your health care provider or respiratory therapist will help you set a goal. Keep a log of your progress as told by your health care provider. What are the risks? Breathing too quickly may cause dizziness or cause you to pass out. Take your time so you do not get dizzy or light-headed. If you are in pain, you may need to take pain medicine before doing incentive spirometry. It is harder to take a deep breath if you are having pain. How to use your incentive spirometer  Sit up on the edge of your bed or on a chair. Hold the incentive spirometer so that it is in an upright position. Before you use the spirometer, breathe out normally. Place  the mouthpiece in your mouth. Make sure your lips are closed tightly around it. Breathe in slowly and as deeply as you can through your mouth, causing the piston or the ball to rise toward the top of the chamber. Hold your breath for 3-5 seconds, or for as long as possible. If the spirometer includes a coach indicator, use this to guide you in breathing. Slow down your breathing if the indicator goes above the marked areas. Remove the mouthpiece from your mouth and breathe out normally. The piston or ball will return to the bottom of the chamber. Rest for a few seconds, then repeat the steps 10 or more times. Take your time and take a few normal breaths between deep breaths so that you do not get dizzy or light-headed. Do this every 1-2 hours when you are awake. If the spirometer includes a goal marker to show the highest number you have reached (best effort), use this as a goal to work toward during each repetition. After each set of 10 deep breaths, cough a few times. This will help to make sure that your lungs are clear. If you have an incision on your chest or abdomen from surgery, place a pillow or a rolled-up towel firmly against the incision when you cough. This can help to reduce pain while taking deep breaths and coughing. General tips When  you are able to get out of bed: Walk around often. Continue to take deep breaths and cough in order to clear your lungs. Keep using the incentive spirometer until your health care provider says it is okay to stop using it. If you have been in the hospital, you may be told to keep using the spirometer at home. Contact a health care provider if: You are having difficulty using the spirometer. You have trouble using the spirometer as often as instructed. Your pain medicine is not giving enough relief for you to use the spirometer as told. You have a fever. Get help right away if: You develop shortness of breath. You develop a cough with bloody mucus from the lungs. You have fluid or blood coming from an incision site after you cough. Summary An incentive spirometer is a tool that can help you learn to take long, deep breaths to keep your lungs clear and active. You may be asked to use a spirometer after a surgery, if you have a lung problem or a history of smoking, or if you have been inactive for a long period of time. Use your incentive spirometer as instructed every 1-2 hours while you are awake. If you have an incision on your chest or abdomen, place a pillow or a rolled-up towel firmly against your incision when you cough. This will help to reduce pain. Get help right away if you have shortness of breath, you cough up bloody mucus, or blood comes from your incision when you cough. This information is not intended to replace advice given to you by your health care provider. Make sure you discuss any questions you have with your health care provider. Document Revised: 10/16/2023 Document Reviewed: 10/16/2023 Elsevier Patient Education  2024 [employer].  Preoperative Educational Videos for Total Hip, Knee and Shoulder Replacements  To better prepare for surgery, please view our videos that explain the physical activity and discharge planning required to have the best surgical recovery at  [facility].  [email or web address]  Questions? Call [phone] or email [email or web address]       [employer] to address  health-related social needs:  [email or web address]

## 2024-08-02 ENCOUNTER — Ambulatory Visit: Payer: Self-pay | Admitting: Urgent Care

## 2024-08-02 DIAGNOSIS — Z79899 Other long term (current) drug therapy: Secondary | ICD-10-CM

## 2024-08-02 DIAGNOSIS — Z01812 Encounter for preprocedural laboratory examination: Secondary | ICD-10-CM

## 2024-08-02 DIAGNOSIS — E876 Hypokalemia: Secondary | ICD-10-CM

## 2024-08-02 LAB — URINE CULTURE: Culture: <10000 — AB

## 2024-08-02 MED ORDER — POTASSIUM CHLORIDE CRYS ER 10 MEQ PO TBCR: EXTENDED_RELEASE_TABLET | ORAL | 0 refills | Status: AC

## 2024-08-02 NOTE — Progress Notes (Signed)
 [facility] Perioperative Services: Pre-Admission/Anesthesia Testing  Abnormal Lab Notification and Treatment Plan of Care   Date: 08/02/24  Name: Laura [name] DOB: April 04, 1951 MRN:   [id number]  Re: Abnormal labs noted during PAT appointment   Notified:  Provider Name Provider Role Notification Mode  Laura Hussar, MD Orthopedics (Surgeon) Routed and/or faxed via [facility]   Clinical Information and Notes:  ABNORMAL LAB VALUE(S): Lab Results  Component Value Date   K 3.0 (L) 08/01/2024   Laura [name] is scheduled for an elective ARTHROPLASTY, HIP, TOTAL,POSTERIOR APPROACH (Right: Hip) on 08/08/2024. In review of her medication reconciliation, it is noted that the patient is taking prescribed diuretic medications (furosemide  + spironolactone ) daily.   Please note, in efforts to promote a safe and effective anesthetic course, per current guidelines/standards set by the [facility] anesthesia team, the minimal acceptable K+ level for the patient to proceed with general anesthesia is 3.0 mmol/L. With that being said, if the patient drops any lower, her elective procedure will need to be postponed until K+ is better optimized. In efforts to prevent case cancellation, and ultimately to promote the safety of this patient undergoing sedation/anesthesia, will make efforts to optimize pre-surgical K+ level allowing the surgical intervention to proceed as planned.    Impression and Plan:  Laura [name] found to be HYPOkalemic at 3.0 mmol/L on preoperative labs.   Mg level was just jest a few months ago and found to be WNL at 2.2. Will defer retesting at this time.  She is on loop + K+ sparing diuretic therapy. Patient is not on any type of daily K+ supplementation per her medical reconciliation. Discussed diuretic therapy as likely etiology in the setting of normal Mg level and in the absence of GI related symptoms (no diarrhea). Patient denies regular use of laxative  medications. Reviewed other potential causes, including decreased intake of dietary K+ and warmer weather resulting in increased insensible losses.   Reviewed plans for short term preoperative optimization. In review of her past metabolic panels, K+ has generally been WNL. In efforts to prevent over correction of her K+ levels, I will provide Rx for  lower dose therapy than I normally would due to the fact that patient is on K+ sparing therapy. This, coupled with the directives on nutritional optimization, should help increase patient's level appropriately prior to surgery.   Meds ordered this encounter  Medications   potassium chloride  SA (KLOR-CON  M) 10 MEQ tablet    Sig: Take 2 tablets (20 mEq) today, then 1 tablet (10 mEq) daily until surgery. Be sure to take dose on day of surgery. Follow up with PCP for repeat labs.    Dispense:  8 tablet    Refill:  0    Please contact the patient as soon as it is available for pickup. Rx is for preoperative K+ optimization and needs to be started ASAP.   Encouraged patient to follow up with PCP about 2-3 weeks postoperatively to have labs rechecked to ensure that levels are remaining within normal range. Discussed nutritional intake of K+ rich foods as an adjunctive way to keep her K+ levels normal; list of K+ rich foods provided. Also mentioned ORS, however advised her not to rely solely on these drinks, as they are high in Na+, and she has a HTN diagnosis.   Will send copy of this note to surgeon and PCP to make them aware of K+ level and plans for correction. Discussed that PCP may elect  to pursue a change in diuretic therapy by backing off of the loop and increasing the K+ sparing dose, or alternatively, they may consider adding a daily K+ supplement if levels remain low on recheck. Order entered to recheck K+ on the day of her surgery to ensure optimization. Wished patient the best of luck with her upcoming surgery and subsequent recovery. She was  encouraged to return call to the PAT clinic, or to her surgeon's office, should any questions or concerns arise between now and the time of her surgery.   Encounter Diagnoses  Name Primary?   Pre-operative laboratory examination Yes   Diuretic-induced hypokalemia    Long term current use of diuretic    Brevon Dewald, MSN, APRN, FNP-C, CEN [facility]  Perioperative Services Nurse Practitioner Phone: [phone] 08/02/24 2:44 PM  NOTE: This note has been prepared using Dragon dictation software. Despite my best ability to proofread, there is always the potential that unintentional transcriptional errors may still occur from this process.

## 2024-08-04 ENCOUNTER — Encounter: Payer: Self-pay | Admitting: Orthopedic Surgery

## 2024-08-04 NOTE — Progress Notes (Signed)
 Perioperative / Anesthesia Services  Pre-Admission Testing Clinical Review / Pre-Operative Anesthesia Consult  Date: 08/04/24  PATIENT DEMOGRAPHICS: Name: Laura [name] DOB: [birth year] MRN:   [id number]  Note: Available PAT nursing documentation and vital signs have been reviewed. Clinical nursing staff has updated patient's PMH/PSHx, current medication list, and drug allergies/intolerances to ensure complete and comprehensive history available to assist care teams in MDM as it pertains to the aforementioned surgical procedure and anticipated anesthetic course. Extensive review of available clinical information personally performed. [place] [facility] and PSHx updated with any diagnoses/procedures that  may have been inadvertently omitted during her intake with the pre-admission testing department's nursing staff.  PLANNED SURGICAL PROCEDURE(S):   Case: [id number] Date/Time: 08/08/24 0845   Procedure: ARTHROPLASTY, HIP, TOTAL,POSTERIOR APPROACH (Right: Hip)   Anesthesia type: Choice   Diagnosis: Primary osteoarthritis of right hip [M16.11]   Pre-op diagnosis: Primary osteoarthritis of right hip M16.11   Location: [facility] OR ROOM 02 / [facility]   Surgeons: Lorelle Hussar, MD        CLINICAL DISCUSSION: Laura [name] is a 73 y.o. female who is submitted for pre-surgical anesthesia review and clearance prior to her undergoing the above procedure. Patient has never been a smoker in the past. Pertinent PMH includes: CAD, atrial fibrillation, NICM, HFrEF, pulmonary hypertension, BILATERAL carotid artery disease, aortic atherosclerosis, aortic root dilatation, cardiomegaly, HTN, HLD, T2DM, asthma, COPD (on supplemental oxygen ), GERD (on daily H2 blocker), OA, BILATERAL rotator cuff arthropathy, chronic L1 compression fracture, thoracic DDD, anxiety (on BZO).   Patient is followed by cardiology Jacolyn, MD). She was last seen in the cardiology clinic on 07/18/2024; notes  reviewed. At the time of her clinic visit, patient doing well overall from a cardiovascular perspective.  Patient with baseline shortness of breath due to an underlying COPD diagnosis that she has had for years.  Patient on chronic corticosteroid therapy (prednisone ) for management.  Patient reported that her breathing was stable and at baseline.  Patient is on supplemental oxygen  during the day as needed and 3L/ at bedtime. Patient denied any chest pain, PND, orthopnea, palpitations, significant peripheral edema, weakness, fatigue, vertiginous symptoms, or presyncope/syncope. Patient with a past medical history significant for cardiovascular diagnoses. Documented physical exam was grossly benign, providing no evidence of acute exacerbation and/or decompensation of the patient's known cardiovascular conditions.  Myocardial perfusion imaging study was performed on 10/22/2016.  Study revealed a low normal left ventricular systolic function with an EF of 53%.  There were no regional wall motion abnormalities.  SPECT images demonstrated homogenous tracer distribution throughout the myocardium indicating no evidence of myocardial ischemia or arrhythmia; no scintigraphic evidence of scar.  Study determined to be normal and low risk.  Patient underwent diagnostic RIGHT/LEFT heart catheterization on 02/27/2020 revealing multivessel CAD; 20% ostial LCx, 20% proximal LAD, and 30% proximal vessel mid LAD.  No significant CAD to require revascularization.  Hemodynamics: RA = 15 mmHg, mean PA = 30 mmHg, PCWP = 24 mmHg, CO = 5.18 L/min, and CI = 2.24 L/min/m.  PVR only 1.16 Woods units.  Pulmonary hypertension seem to be related to LEFT-sided heart failure.  Patient volume overloaded and requiring diuresis.    Most recent TTE performed on 05/10/2024 revealed a mildly reduced left ventricular systolic function with an EF of 45-50%.  Left ventricle mildly dilated.  Left ventricular diastolic Doppler parameters were normal.   Right ventricle mildly dilated in size with low normal systolic function; TAPSE = 1.8 cm (normal range >/=  1.6 cm).  RVSP = 40 mmHg consistent with patient's known pulmonary hypertension diagnosis.  There was mild mitral annular calcification.  There was mild to moderate tricuspid valve regurgitation.  All transvalvular gradients were noted to be normal providing no evidence of hemodynamically significant valvular stenosis. Aorta normal in size with no evidence of ectasia or aneurysmal dilatation.  Patient with a history of known carotid artery disease.  Most recent carotid Doppler study performed on 06/30/2022 revealed a 1-39% stenosis of the patient's BILATERAL carotid arteries.  Vertebral arteries demonstrated antegrade flow.  There were normal flow hemodynamics seen in the subclavian arteries.  Patient diagnosed with atrial fibrillation in 09/2016.  Patient failed flecainide and propafenone due to lower extremity swelling and shortness of breath.  She could not afford Multaq.  CHA2DS2-VASc Score = 8 (age, sex, HFrEF, HTN, CVA x 2, vascular disease, T2DM). Her rate and rhythm are currently being maintained on oral metoprolol  succinate. She is chronically anticoagulated using apixaban ; reported to be compliant with therapy with no evidence or reports of GI/GU bleeding.  NICM with resulting HFrEF being managed on currently prescribed GDMT, including diuretic (furosemide ) and beta-blocker (metoprolol  succinate) therapies.  Blood pressure stable on the aforementioned regimen as documented by cardiology at 100/66 mmHg.  She is on rosuvastatin  for her HLD diagnosis and further ASCVD prevention. T2DM well controlled on currently prescribed regimen; last HgbA1c was 5.6% when checked on 07/06/2024. In the setting of known cardiovascular diagnoses and concurrent T2DM, patient is taking an SGLT2i (empagliflozin ) for added cardiovascular and renovascular protection.  Functional capacity somewhat limited by patient's  age, underlying COPD, and supplemental oxygen  requirements. With that said, patient complete all of her ADLs/IADLs without significant cardiovascular limitation.  Per the DASI, patient is felt to be able to achieve at least 4 METS physical activity without experiencing any significant degree of angina/anginal equivalent symptoms.  No changes were made to her medication regimen.  Patient follow-up with outpatient cardiology in 6 months or sooner if needed.  Laura [name] is scheduled for an elective ARTHROPLASTY, HIP, TOTAL,POSTERIOR APPROACH (Right: Hip) on 08/08/2024 with Dr. Arthea Sheer, MD.  Given patient's past medical history significant for cardiovascular diagnoses, presurgical cardiac clearance was sought by the PAT team.  Per cardiology, patient is unable to accomplish more than 4 METS due to physical limitations as she ambulates with motorized chair currently. She has a history of a congestive heart failure with ejection fraction 45 to 50%, permanent atrial fibrillation on rate control, denied any active cardiac conditions including unstable angina, decompensated congestive heart failure, unmanaged significant arrhythmias, or severe valvular disease. For a moderate risk procedure like hip replacement, her revised cardiac risk index for preoperative risk is 2 points, which translates to a class III risk and 10% of 30-day risk of death, MI, or cardiac arrest. Patient has a relatively HIGH risk for the proposed procedure, due to history of congestive heart failure and permanent atrial fibrillation, however, patient is relatively well compensated currently. I recommend close cardiopulmonary monitoring during her surgery. Avoid volume overload, and continue metoprolol  throughout her surgery.  Again, this patient is on daily oral anticoagulation therapy using a DOAC medication.  She has been instructed on recommendations for holding her apixaban  for 2 days prior to her procedure with plans to restart  as soon as postoperative bleeding risk felt to be minimized by his primary attending surgeon. The patient has been instructed that her last dose of should be on 08/05/2024.  Patient denies previous perioperative complications  with anesthesia in the past.  Patient does make mention of (+) PONV in a second-degree female relative (granddaughter). In review her EMR, it is noted that patient underwent a general anesthetic course at [facility] ([facility]) in 10/2020 without documented complications.   MOST RECENT VITAL SIGNS:    08/01/2024    9:27 AM 06/21/2024    3:14 PM 05/24/2024    3:27 PM  Vitals with BMI  Height 5' 4 5' 4   Weight 205 lbs 10 oz 220 lbs 220 lbs  BMI 35.27 37.74   Systolic 121  114  Diastolic 73  72  Pulse 44     PROVIDERS/SPECIALISTS: NOTE: Primary physician provider listed below. Patient may have been seen by APP or partner within same practice.   PROVIDER ROLE / SPECIALTY LAST Laura [name] Lorelle Hussar, MD Orthopedics (Surgeon) 07/13/2024  Pcp, No Primary Care Provider ???  Cena Forehand, MD Cardiology 07/18/2024   ALLERGIES: Allergies  Allergen Reactions   Flecainide Shortness Of Breath   Propafenone Shortness Of Breath, Swelling and Other (See Comments)    My limbs swell   Rivaroxaban Swelling and Other (See Comments)    My limbs swell, myalgias   Tirzepatide  Diarrhea and Nausea And Vomiting    Severe adverse side effects of mounjaro     CURRENT HOME MEDICATIONS: No current facility-administered medications for this encounter.    Accu-Chek Softclix Lancets lancets   alendronate (FOSAMAX) 70 MG tablet   ALPRAZolam  (XANAX ) 0.25 MG tablet   COMBIVENT RESPIMAT 20-100 MCG/ACT AERS respimat   ELIQUIS  5 MG TABS tablet   EPINEPHrine  0.3 mg/0.3 mL IJ SOAJ injection   famotidine  (PEPCID ) 40 MG tablet   fluticasone  (FLONASE) 50 MCG/ACT nasal spray   Fluticasone -Umeclidin-Vilant (TRELEGY ELLIPTA) 200-62.5-25 MCG/ACT AEPB   furosemide  (LASIX ) 20 MG tablet    glucose blood (ACCU-CHEK GUIDE) test strip   ipratropium-albuterol  (DUONEB) 0.5-2.5 (3) MG/3ML SOLN   JARDIANCE  10 MG TABS tablet   loperamide (IMODIUM) 2 MG capsule   metoprolol  succinate (TOPROL -XL) 25 MG 24 hr tablet   montelukast  (SINGULAIR ) 10 MG tablet   OXYGEN    potassium chloride  SA (KLOR-CON  M) 10 MEQ tablet   predniSONE  (DELTASONE ) 10 MG tablet   rosuvastatin  (CRESTOR ) 20 MG tablet   Semaglutide , 1 MG/DOSE, (OZEMPIC , 1 MG/DOSE,) 4 MG/3ML SOPN   spironolactone  (ALDACTONE ) 25 MG tablet   tizanidine (ZANAFLEX) 2 MG capsule   HISTORY: Past Medical History:  Diagnosis Date   Acute ischemic left MCA stroke (HCC) 11/20/2020   a.) brain MRI 11/20/2020 - acute M1 MCA occlusion --> embolectomy; (+) residual LEFT  sided weakness   Anxiety    a.) on BZO PRN (alprazolam )   Aortic atherosclerosis (HCC)    Aortic root dilatation (HCC)    Asthma    Bilateral carotid artery disease (HCC)    Cardiomegaly    Chronic atrial fibrillation (HCC) 09/2016   a.) Dx'd 09/2016; b.) failed flecainide and propafenone due to LE swelling and SOB, could not afford Multaq; c.) CHA2DS2VASc = 8 (age, sex, HFrEF, HTN, CVA x 2, vascular disease, T2DM) as of 08/04/2024; d.) rate/rhythm maintained on oral metoprolol  succinate; chronically anticoagulated with apixaban    Compression fracture of L1 vertebra (chronic)    COPD (chronic obstructive pulmonary disease) (HCC)    DDD (degenerative disc disease), thoracic    Dependence on supplemental oxygen     a.) 2 L/[place] PRN; 3L bled in with CPAP at bedtime   DM (diabetes mellitus), type 2 (HCC)  Family history of adverse reaction to anesthesia    a.) PONV in 2nd degree female relative (granddaughter)   GERD (gastroesophageal reflux disease)    HFmrEF (heart failure with mid-range ejection fraction) (HCC)    a. 12/2019 Echo: EF 40-45%. b. 01/2022 Echo: EF 40-45%.   History of 2019 novel coronavirus disease (COVID-19) 10/10/2019   History of methicillin resistant  staphylococcus aureus (MRSA)    Hyperlipidemia    Hypertension    Long-term corticosteroid use (predinsone x 10+ years)    NICM (nonischemic cardiomyopathy) (HCC)    a. 12/2019 Echo: EF 40-45%, glob HK, mildly reduced RV fxn, sev dil LA. *HR 130 (afib) during study.   Nonobstructive CAD (coronary artery disease)    a. Lexiscan Myoview 10/2016: no evidence of ischemia, EF 53%; b. 02/2020 Cath: LM nl, LAD 20p, 93m, LCX 20ost, OM1/2/3 nl, LPDA nl, LPL1/2 nl, LPAV nl, RCA small, nl.   Nose colonized with MRSA 11/20/2020   a.) presurgical PCR (+) 11/20/2020 prior to IR CEREBRAL EMBOLECTOMY   Obesity    On apixaban  therapy    OSA on CPAP    a.) requires 3L O2 bled in at bedtime   Osteoarthritis    Osteoporosis    Pulmonary hypertension (HCC)    Rotator cuff arthropathy of both shoulders    Spinal stenosis of lumbar region 02/03/2024   Status post bilateral cataract extraction    Past Surgical History:  Procedure Laterality Date   BOWEL RESECTION  09/11/2019   Procedure: SMALL BOWEL RESECTION;  Surgeon: Rodolph Romano, MD;  Location: [facility] ORS;  Service: General;;   CATARACT EXTRACTION Bilateral    COLONOSCOPY WITH PROPOFOL  N/A 03/19/2020   Procedure: COLONOSCOPY WITH PROPOFOL ;  Surgeon: Therisa Bi, MD;  Location: [facility] ENDOSCOPY;  Service: Gastroenterology;  Laterality: N/A;   INCISION AND DRAINAGE ABSCESS Right 06/29/2016   Procedure: INCISION AND DRAINAGE ABSCESS;  Surgeon: Charlie FORBES Fell, MD;  Location: [facility] ORS;  Service: General;  Laterality: Right;   INCISION AND DRAINAGE OF WOUND Left 06/29/2016   Procedure: IRRIGATION AND DEBRIDEMENT WOUND;  Surgeon: Charlie FORBES Fell, MD;  Location: [facility] ORS;  Service: General;  Laterality: Left;   IR ANGIO VERTEBRAL SEL SUBCLAVIAN INNOMINATE UNI R MOD SED  11/20/2020   IR CT HEAD LTD  11/20/2020   IR PERCUTANEOUS ART THROMBECTOMY/INFUSION INTRACRANIAL INC DIAG ANGIO  11/20/2020   LAPAROSCOPIC RIGHT COLECTOMY  09/11/2019   Procedure: RIGHT  COLECTOMY;  Surgeon: Rodolph Romano, MD;  Location: [facility] ORS;  Service: General;;   LAPAROSCOPY N/A 09/11/2019   Procedure: LAPAROSCOPY DIAGNOSTIC;  Surgeon: Rodolph Romano, MD;  Location: [facility] ORS;  Service: General;  Laterality: N/A;   LAPAROTOMY N/A 09/13/2019   Procedure: REOPENING OF RECENT LAPAROTOMYANASTOMOSIS OF BOWEL;  Surgeon: Rodolph Romano, MD;  Location: [facility] ORS;  Service: General;  Laterality: N/A;   RADIOLOGY WITH ANESTHESIA N/A 11/20/2020   Procedure: IR WITH ANESTHESIA - CODE STROKE;  Surgeon: Radiologist, Medication, MD;  Location: [facility] OR;  Service: Radiology;  Laterality: N/A;   RIGHT/LEFT HEART CATH AND CORONARY ANGIOGRAPHY Bilateral 02/27/2020   Procedure: RIGHT/LEFT HEART CATH AND CORONARY ANGIOGRAPHY;  Surgeon: Darron Deatrice LABOR, MD;  Location: [facility] INVASIVE CV LAB;  Service: Cardiovascular;  Laterality: Bilateral;   VISCERAL ANGIOGRAPHY N/A 09/12/2019   Procedure: VISCERAL ANGIOGRAPHY;  Surgeon: Marea Selinda RAMAN, MD;  Location: [facility] INVASIVE CV LAB;  Service: Cardiovascular;  Laterality: N/A;   Family History  Problem Relation Age of Onset   Dementia Mother    Osteoporosis Mother  Vascular Disease Mother    COPD Father    Heart disease Brother    Cancer Daughter    Social History   Tobacco Use   Smoking status: Never   Smokeless tobacco: Never  Substance Use Topics   Alcohol use: No   LABS:  Hospital Outpatient Visit on 08/01/2024  Component Date Value Ref Range Status   ABO/RH(D) 08/01/2024 A POS   Final   Antibody Screen 08/01/2024 NEG   Final   Sample Expiration 08/01/2024 08/15/2024,2359   Final   Extend sample reason 08/01/2024    Final                   Value:NO TRANSFUSIONS OR PREGNANCY IN THE PAST 3 MONTHS Performed at [facility], [address] [place], [place] [address]    MRSA, PCR 08/01/2024 NEGATIVE  NEGATIVE Final   Staphylococcus aureus 08/01/2024 NEGATIVE  NEGATIVE Final   Comment: (NOTE) The Xpert SA  Assay (FDA approved for NASAL specimens in patients 17 years of age and older), is one component of a comprehensive surveillance program. It is not intended to diagnose infection nor to guide or monitor treatment. Performed at [facility], [address] [place], [place] [address]    WBC 08/01/2024 7.9  4.0 - 10.5 K/uL Final   RBC 08/01/2024 4.75  3.87 - 5.11 MIL/uL Final   Hemoglobin 08/01/2024 14.6  12.0 - 15.0 g/dL Final   HCT 91/88/7974 44.1  36.0 - 46.0 % Final   MCV 08/01/2024 92.8  80.0 - 100.0 fL Final   MCH 08/01/2024 30.7  26.0 - 34.0 pg Final   MCHC 08/01/2024 33.1  30.0 - 36.0 g/dL Final   RDW 91/88/7974 14.1  11.5 - 15.5 % Final   Platelets 08/01/2024 287  150 - 400 K/uL Final   nRBC 08/01/2024 0.0  0.0 - 0.2 % Final   Neutrophils Relative % 08/01/2024 50  % Final   Neutro Abs 08/01/2024 4.0  1.7 - 7.7 K/uL Final   Lymphocytes Relative 08/01/2024 38  % Final   Lymphs Abs 08/01/2024 3.0  0.7 - 4.0 K/uL Final   Monocytes Relative 08/01/2024 10  % Final   Monocytes Absolute 08/01/2024 0.8  0.1 - 1.0 K/uL Final   Eosinophils Relative 08/01/2024 1  % Final   Eosinophils Absolute 08/01/2024 0.1  0.0 - 0.5 K/uL Final   Basophils Relative 08/01/2024 1  % Final   Basophils Absolute 08/01/2024 0.0  0.0 - 0.1 K/uL Final   Immature Granulocytes 08/01/2024 0  % Final   Abs Immature Granulocytes 08/01/2024 0.02  0.00 - 0.07 K/uL Final   Performed at [facility], [address] [place], [place] [address]   Sodium 08/01/2024 138  135 - 145 mmol/L Final   Potassium 08/01/2024 3.0 (L)  3.5 - 5.1 mmol/L Final   Chloride 08/01/2024 101  98 - 111 mmol/L Final   CO2 08/01/2024 27  22 - 32 mmol/L Final   Glucose, Bld 08/01/2024 98  70 - 99 mg/dL Final   Glucose reference range applies only to samples taken after fasting for at least 8 hours.   BUN 08/01/2024 13  8 - 23 mg/dL Final   Creatinine, Ser 08/01/2024 0.61  0.44 - 1.00 mg/dL Final   Calcium  08/01/2024 9.0   8.9 - 10.3 mg/dL Final   Total Protein 91/88/7974 6.4 (L)  6.5 - 8.1 g/dL Final   Albumin 91/88/7974 3.8  3.5 - 5.0 g/dL Final  AST 08/01/2024 16  15 - 41 U/L Final   ALT 08/01/2024 11  0 - 44 U/L Final   Alkaline Phosphatase 08/01/2024 45  38 - 126 U/L Final   Total Bilirubin 08/01/2024 1.5 (H)  0.0 - 1.2 mg/dL Final   GFR, Estimated 08/01/2024 >60  >60 mL/min Final   Comment: (NOTE) Calculated using the CKD-EPI Creatinine Equation (2021)    Anion gap 08/01/2024 10  5 - 15 Final   Performed at [facility], [address] [place], [place] [address]   Color, Urine 08/01/2024 YELLOW (A)  YELLOW Final   APPearance 08/01/2024 CLEAR (A)  CLEAR Final   Specific Gravity, Urine 08/01/2024 1.033 (H)  1.005 - 1.030 Final   pH 08/01/2024 5.0  5.0 - 8.0 Final   Glucose, UA 08/01/2024 >=500 (A)  NEGATIVE mg/dL Final   Hgb urine dipstick 08/01/2024 SMALL (A)  NEGATIVE Final   Bilirubin Urine 08/01/2024 NEGATIVE  NEGATIVE Final   Ketones, ur 08/01/2024 NEGATIVE  NEGATIVE mg/dL Final   Protein, ur 91/88/7974 NEGATIVE  NEGATIVE mg/dL Final   Nitrite 91/88/7974 NEGATIVE  NEGATIVE Final   Leukocytes,Ua 08/01/2024 NEGATIVE  NEGATIVE Final   RBC / HPF 08/01/2024 6-10  0 - 5 RBC/hpf Final   WBC, UA 08/01/2024 11-20  0 - 5 WBC/hpf Final   Bacteria, UA 08/01/2024 RARE (A)  NONE SEEN Final   Squamous Epithelial / HPF 08/01/2024 0-5  0 - 5 /HPF Final   Mucus 08/01/2024 PRESENT   Final   Performed at [facility], [address], [place], [place] [address]   Specimen Description 08/01/2024    Final                   Value:URINE, CLEAN CATCH Performed at [facility], [address] [place], [place] [address]    Special Requests 08/01/2024    Final                   Value:NONE Performed at [facility], [address], [place], [place] [address]    Culture 08/01/2024  (A)   Final                   Value:<10,000 COLONIES/mL INSIGNIFICANT GROWTH Performed at  [facility], [address] [place], [place] [address]    Report Status 08/01/2024 08/02/2024 FINAL   Final    ECG: Date: 07/18/2024  Rhythm: Atrial fibrillation with PVCs Axis (leads I and aVF): normal ST segment and T wave changes: Lateral ST/T wave abnormalities  Evidence of a possible, age undetermined, prior infarct: Yes; anterior Comparison: Similar to previous tracing obtained on 05/19/2024 NOTE: Tracing obtained at [facility]; unable for review. Above based on cardiologist's interpretation.    IMAGING / PROCEDURES: TRANSTHORACIC ECHOCARDIOGRAM performed on 05/10/2024 Limited study to assess ventricular function.  The left ventricle is mildly dilated in size with normal wall thickness.  The left ventricular systolic function is mildly decreased, LVEF is visually estimated at 45-50%.  The right ventricle is mildly dilated in size, with low normal systolic function.  Mitral annular calcification is present (mild).  There is mild to moderate tricuspid regurgitation.  There is mild pulmonary hypertension.  IVC size and inspiratory change suggest elevated right atrial pressure (10-20 mmHg).   CTA CHEST W CONTRAST performed on 05/07/2024 No acute pulmonary embolus identified. The pulmonary artery is borderline enlarged measuring 3.1 cm.  Heart is mildly enlarged. Dense coronary artery calcifications. Normal caliber  aorta with extensive calcifications.  Bilateral dependent atelectasis. No pleural effusion or pneumothorax.  Low-attenuation lesions in the liver. Fatty deposition along the falciform ligament.  Degenerative changes of the spine.   DIAGNOSTIC RADIOGRAPHS OF CHEST 2 views performed on 05/07/2024 Lungs are low in volume with bibasilar consolidations, presumed atelectasis. Multifocal bronchial wall thickening.  No pleural effusion or pneumothorax identified.  Cardiac silhouette is enlarged with calcification of the thoracic aorta, similar to the prior exam.  Findings  compatible with rotator cuff arthropathy bilaterally.  Degenerative changes of the spine with chronic appearing compression deformity of the L1 vertebral body.   DIAGNOSTIC RADIOGRAPHS OF RIGHT HIP 2 OR 3 VIEWS WITH OR WITHOUT PELVIS  performed on 05/04/2024 Right hip which reveal severe degenerative changes with femoral head collapse. Joint space narrowing with sclerosis and osteophyte formation and cystic changes in the superior dome of the femoral head of the acetabulum.   The left hip shows moderate degenerative changes in the central joint space narrowing sclerosis osteophyte formation and early subchondral cystic formation.   No fractures or dislocations in either hip or the pelvis.  .     VAS US  ABI WITH/WO TBI performed on 04/13/2023 Resting right ankle-brachial index indicates noncompressible right lower extremity arteries.  Resting left ankle-brachial index indicates noncompressible left lower extremity arteries.   VAS US  CAROTID performed on 06/30/2022 Velocities in the right ICA are consistent with a 1-39% stenosis.  Velocities in the left ICA are consistent with a 1-39% stenosis.  Bilateral vertebral arteries demonstrate antegrade flow.  Normal flow hemodynamics were seen in bilateral subclavian arteries.   RIGHT/LEFT HEART CATH AND CORONARY ANGIOGRAPHY performed on 02/27/2020 Mild nonobstructive coronary artery disease.  Left dominant coronary arteries. Ost Cx lesion is 20% stenosed. Prox LAD lesion is 20% stenosed. Prox LAD to Mid LAD lesion is 30% stenosed. Left ventricular angiography was not performed.  EF was mildly reduced by echo. Right heart catheterization showed moderately elevated filling pressures with an RA of 15 mmHg, pulmonary capillary wedge pressure of 24 mmHg, PA pressure of 43/24 with a mean of 30 mmHg.  Normal cardiac output at 5.18 with a cardiac index of 2.34.  Pulmonary vascular resistance was only 1.16 Woods units. Recommendations: No significant  coronary artery disease to require revascularization. Pulmonary hypertension seems to be all related to left sided heart failure.   The patient is volume overloaded.  She used to be on 40 mg of furosemide  but that was discontinued when she was having diarrhea.  I am going to resume furosemide  at the lower dose of 20 mg daily.   MYOCARDIAL PERFUSION IMAGING STUDY (LEXISCAN) performed on 10/22/2016 Normal left ventricular systolic function with an EF of 53% No regional wall motion abnormalities Left ventricular cavity size normal No artifact No evidence of stress-induced myocardial ischemia or arrhythmia; no scintigraphic evidence of scar Normal low risk study  IMPRESSION AND PLAN: MAKENZEY NANNI has been referred for pre-anesthesia review and clearance prior to her undergoing the planned anesthetic and procedural courses. Available labs, pertinent testing, and imaging results were personally reviewed by me in preparation for upcoming operative/procedural course. [facility] medical record has been updated following extensive record review and patient interview with PAT staff.   Preoperative K+ levels found to be low at 3.0 mmol/L. Patient denies GI symptoms. Magnesium normal at 2.2. She is on daily loop + K+ sparing diuretic therapy. Low dose supplement being sent; caution exercised given K+ sparing therapy. Prescription sent in for K-Dur 20  mEq x 1 dose, followed by 10 mEq daily x 6 days. Patient will take a dose on the morning of her procedure. We will plan on rechecking K+ levels prior to patient's surgery to ensure that she is safe to proceed with the planned surgical/anesthetic course. Patient and surgeon have been updated on the plan of care as it stands at this point.   This patient has been appropriately cleared by cardiology with an overall HIGH/ACCEPTABLE risk of patient experiencing significant perioperative cardiovascular complications. Based on clinical review performed today  (08/04/24), barring any significant acute changes in the patient's overall condition, it is anticipated that she will be able to proceed with the planned surgical intervention. Any acute changes in clinical condition may necessitate her procedure being postponed and/or cancelled. Patient will meet with anesthesia team (MD and/or CRNA) on the day of her procedure for preoperative evaluation/assessment. Questions regarding anesthetic course will be fielded at that time.   Pre-surgical instructions were reviewed with the patient during his PAT appointment, and questions were fielded to satisfaction by PAT clinical staff. She has been instructed on which medications that she will need to hold prior to surgery, as well as the ones that have been deemed safe/appropriate to take on the day of her procedure. As part of the general education provided by PAT, patient made aware both verbally and in writing, that she would need to abstain from the use of any illegal substances during her perioperative course. She was advised that failure to follow the provided instructions could necessitate case cancellation or result in serious perioperative complications up to and including death. Patient encouraged to contact PAT and/or her surgeon's office to discuss any questions or concerns that may arise prior to surgery; verbalized understanding.   Dorise Pereyra, MSN, APRN, FNP-C, CEN [facility]  Perioperative Services Nurse Practitioner Phone: [phone] Fax: [phone] 08/04/24 12:45 PM  NOTE: This note has been prepared using Dragon dictation software. Despite my best ability to proofread, there is always the potential that unintentional transcriptional errors may still occur from this process.

## 2024-08-05 ENCOUNTER — Encounter: Payer: Self-pay | Admitting: Orthopedic Surgery

## 2024-08-08 ENCOUNTER — Encounter: Payer: Self-pay | Admitting: Orthopedic Surgery

## 2024-08-08 ENCOUNTER — Ambulatory Visit: Payer: Self-pay | Admitting: Urgent Care

## 2024-08-08 ENCOUNTER — Ambulatory Visit

## 2024-08-08 ENCOUNTER — Ambulatory Visit
Admission: RE | Admit: 2024-08-08 | Discharge: 2024-08-09 | Disposition: A | Discharge status: Home Health | Attending: Orthopedic Surgery | Admitting: Orthopedic Surgery

## 2024-08-08 ENCOUNTER — Other Ambulatory Visit: Payer: Self-pay

## 2024-08-08 ENCOUNTER — Encounter
Admission: RE | Disposition: A | Discharge status: Home Health | Payer: Self-pay | Source: Home / Self Care | Attending: Orthopedic Surgery

## 2024-08-08 DIAGNOSIS — Z7952 Long term (current) use of systemic steroids: Secondary | ICD-10-CM | POA: Insufficient documentation

## 2024-08-08 DIAGNOSIS — M1611 Unilateral primary osteoarthritis, right hip: Secondary | ICD-10-CM | POA: Diagnosis not present

## 2024-08-08 DIAGNOSIS — Z8262 Family history of osteoporosis: Secondary | ICD-10-CM | POA: Insufficient documentation

## 2024-08-08 DIAGNOSIS — J4489 Other specified chronic obstructive pulmonary disease: Secondary | ICD-10-CM | POA: Diagnosis not present

## 2024-08-08 DIAGNOSIS — K219 Gastro-esophageal reflux disease without esophagitis: Secondary | ICD-10-CM | POA: Diagnosis not present

## 2024-08-08 DIAGNOSIS — I2510 Atherosclerotic heart disease of native coronary artery without angina pectoris: Secondary | ICD-10-CM | POA: Insufficient documentation

## 2024-08-08 DIAGNOSIS — Z7985 Long-term (current) use of injectable non-insulin antidiabetic drugs: Secondary | ICD-10-CM | POA: Insufficient documentation

## 2024-08-08 DIAGNOSIS — I5022 Chronic systolic (congestive) heart failure: Secondary | ICD-10-CM | POA: Insufficient documentation

## 2024-08-08 DIAGNOSIS — Z79899 Other long term (current) drug therapy: Secondary | ICD-10-CM

## 2024-08-08 DIAGNOSIS — M169 Osteoarthritis of hip, unspecified: Secondary | ICD-10-CM

## 2024-08-08 DIAGNOSIS — Z96641 Presence of right artificial hip joint: Secondary | ICD-10-CM | POA: Diagnosis present

## 2024-08-08 DIAGNOSIS — Z7901 Long term (current) use of anticoagulants: Secondary | ICD-10-CM | POA: Diagnosis not present

## 2024-08-08 DIAGNOSIS — E1151 Type 2 diabetes mellitus with diabetic peripheral angiopathy without gangrene: Secondary | ICD-10-CM | POA: Diagnosis not present

## 2024-08-08 DIAGNOSIS — I4821 Permanent atrial fibrillation: Secondary | ICD-10-CM | POA: Insufficient documentation

## 2024-08-08 DIAGNOSIS — I700 Atherosclerosis of aorta: Secondary | ICD-10-CM | POA: Diagnosis not present

## 2024-08-08 DIAGNOSIS — Z9981 Dependence on supplemental oxygen: Secondary | ICD-10-CM | POA: Diagnosis not present

## 2024-08-08 DIAGNOSIS — Z01818 Encounter for other preprocedural examination: Secondary | ICD-10-CM

## 2024-08-08 DIAGNOSIS — I110 Hypertensive heart disease with heart failure: Secondary | ICD-10-CM | POA: Insufficient documentation

## 2024-08-08 DIAGNOSIS — Z01812 Encounter for preprocedural laboratory examination: Secondary | ICD-10-CM

## 2024-08-08 DIAGNOSIS — I428 Other cardiomyopathies: Secondary | ICD-10-CM | POA: Diagnosis not present

## 2024-08-08 DIAGNOSIS — G4733 Obstructive sleep apnea (adult) (pediatric): Secondary | ICD-10-CM | POA: Insufficient documentation

## 2024-08-08 DIAGNOSIS — Z8673 Personal history of transient ischemic attack (TIA), and cerebral infarction without residual deficits: Secondary | ICD-10-CM | POA: Diagnosis not present

## 2024-08-08 DIAGNOSIS — Z833 Family history of diabetes mellitus: Secondary | ICD-10-CM | POA: Diagnosis not present

## 2024-08-08 DIAGNOSIS — M810 Age-related osteoporosis without current pathological fracture: Secondary | ICD-10-CM | POA: Diagnosis not present

## 2024-08-08 DIAGNOSIS — E876 Hypokalemia: Secondary | ICD-10-CM

## 2024-08-08 DIAGNOSIS — E785 Hyperlipidemia, unspecified: Secondary | ICD-10-CM | POA: Insufficient documentation

## 2024-08-08 DIAGNOSIS — Z8249 Family history of ischemic heart disease and other diseases of the circulatory system: Secondary | ICD-10-CM | POA: Insufficient documentation

## 2024-08-08 DIAGNOSIS — I482 Chronic atrial fibrillation, unspecified: Secondary | ICD-10-CM | POA: Diagnosis not present

## 2024-08-08 DIAGNOSIS — G709 Myoneural disorder, unspecified: Secondary | ICD-10-CM | POA: Diagnosis not present

## 2024-08-08 HISTORY — DX: Other intervertebral disc degeneration, thoracic region: M51.34

## 2024-08-08 HISTORY — DX: Long term (current) use of anticoagulants: Z79.01

## 2024-08-08 HISTORY — DX: Cataract extraction status, right eye: Z98.41

## 2024-08-08 HISTORY — DX: Other specific arthropathies, not elsewhere classified, left shoulder: M12.812

## 2024-08-08 HISTORY — DX: Atherosclerosis of aorta: I70.0

## 2024-08-08 HISTORY — DX: Cardiomegaly: I51.7

## 2024-08-08 HISTORY — DX: Wedge compression fracture of first lumbar vertebra, initial encounter for closed fracture: S32.010A

## 2024-08-08 HISTORY — DX: Long term (current) use of systemic steroids: Z79.52

## 2024-08-08 HISTORY — DX: Age-related osteoporosis without current pathological fracture: M81.0

## 2024-08-08 HISTORY — DX: Disorder of arteries and arterioles, unspecified: I77.9

## 2024-08-08 HISTORY — DX: Other specific arthropathies, not elsewhere classified, right shoulder: M12.811

## 2024-08-08 HISTORY — DX: Thoracic aortic ectasia: I77.810

## 2024-08-08 HISTORY — DX: Cataract extraction status, right eye: Z98.42

## 2024-08-08 HISTORY — DX: Unspecified osteoarthritis, unspecified site: M19.90

## 2024-08-08 LAB — GLUCOSE, CAPILLARY
Glucose-Capillary: 133 mg/dL — ABNORMAL HIGH (ref 70–99)
Glucose-Capillary: 133 mg/dL — ABNORMAL HIGH (ref 70–99)
Glucose-Capillary: 105 mg/dL — ABNORMAL HIGH (ref 70–99)

## 2024-08-08 LAB — POCT I-STAT, CHEM 8
BUN: 10 mg/dL (ref 8–23)
Calcium, Ion: 1.17 mmol/L (ref 1.15–1.40)
Chloride: 102 mmol/L (ref 98–111)
Creatinine, Ser: 0.7 mg/dL (ref 0.44–1.00)
Glucose, Bld: 132 mg/dL — ABNORMAL HIGH (ref 70–99)
HCT: 41 % (ref 36.0–46.0)
Hemoglobin: 13.9 g/dL (ref 12.0–15.0)
Potassium: 3.3 mmol/L — ABNORMAL LOW (ref 3.5–5.1)
Sodium: 140 mmol/L (ref 135–145)
TCO2: 23 mmol/L (ref 22–32)

## 2024-08-08 LAB — ABO/RH: ABO/RH(D): A POS

## 2024-08-08 LAB — HEMOGLOBIN A1C
Hgb A1c MFr Bld: 5.8 % — ABNORMAL HIGH (ref 4.8–5.6)
Mean Plasma Glucose: 119.76 mg/dL

## 2024-08-08 SURGERY — ARTHROPLASTY, HIP, TOTAL,POSTERIOR APPROACH
Anesthesia: Spinal | Site: Hip | Laterality: Right

## 2024-08-08 MED ORDER — DEXMEDETOMIDINE HCL IN NACL 80 MCG/20ML IV SOLN
INTRAVENOUS | Status: DC | PRN
Start: 2024-08-08 — End: 2024-08-08
  Administered 2024-08-08 (×3): 4 ug via INTRAVENOUS

## 2024-08-08 MED ORDER — CEFAZOLIN SODIUM-DEXTROSE 2-4 GM/100ML-% IV SOLN
INTRAVENOUS | Status: AC
  Filled 2024-08-08: qty 100

## 2024-08-08 MED ORDER — CEFAZOLIN SODIUM-DEXTROSE 2-4 GM/100ML-% IV SOLN
2.0000 g | Freq: Four times a day (QID) | INTRAVENOUS | Status: AC
  Administered 2024-08-08 (×2): 2 g via INTRAVENOUS
  Filled 2024-08-08 (×4): qty 100

## 2024-08-08 MED ORDER — TRANEXAMIC ACID-NACL 1000-0.7 MG/100ML-% IV SOLN
INTRAVENOUS | Status: AC
  Filled 2024-08-08: qty 100

## 2024-08-08 MED ORDER — OXYCODONE HCL 5 MG PO TABS: 5.0000 mg | ORAL_TABLET | Freq: Once | ORAL | Status: DC | PRN

## 2024-08-08 MED ORDER — PHENYLEPHRINE HCL-NACL 20-0.9 MG/250ML-% IV SOLN
INTRAVENOUS | Status: DC | PRN
  Administered 2024-08-08: 40 ug/min via INTRAVENOUS

## 2024-08-08 MED ORDER — KETOROLAC TROMETHAMINE 15 MG/ML IJ SOLN
INTRAMUSCULAR | Status: AC
  Filled 2024-08-08: qty 1

## 2024-08-08 MED ORDER — CHLORHEXIDINE GLUCONATE 0.12 % MT SOLN
OROMUCOSAL | Status: AC
  Filled 2024-08-08: qty 15

## 2024-08-08 MED ORDER — METOPROLOL SUCCINATE ER 25 MG PO TB24
75.0000 mg | ORAL_TABLET | Freq: Every day | ORAL | Status: DC
  Administered 2024-08-09: 75 mg via ORAL
  Filled 2024-08-08 (×2): qty 3

## 2024-08-08 MED ORDER — BUPIVACAINE HCL (PF) 0.5 % IJ SOLN
INTRAMUSCULAR | Status: DC | PRN
  Administered 2024-08-08: 2.5 mL

## 2024-08-08 MED ORDER — APIXABAN 2.5 MG PO TABS
5.0000 mg | ORAL_TABLET | Freq: Two times a day (BID) | ORAL | Status: DC
  Administered 2024-08-09: 5 mg via ORAL
  Filled 2024-08-08: qty 2

## 2024-08-08 MED ORDER — ROSUVASTATIN CALCIUM 10 MG PO TABS: 20.0000 mg | ORAL_TABLET | Freq: Every day | ORAL | Status: DC

## 2024-08-08 MED ORDER — FUROSEMIDE 20 MG PO TABS
20.0000 mg | ORAL_TABLET | ORAL | Status: DC
  Administered 2024-08-09: 20 mg via ORAL
  Filled 2024-08-08: qty 1

## 2024-08-08 MED ORDER — OXYCODONE HCL 5 MG/5ML PO SOLN: 5.0000 mg | Freq: Once | ORAL | Status: DC | PRN

## 2024-08-08 MED ORDER — PANTOPRAZOLE SODIUM 40 MG PO TBEC
40.0000 mg | DELAYED_RELEASE_TABLET | Freq: Every day | ORAL | Status: DC
  Administered 2024-08-09: 40 mg via ORAL
  Filled 2024-08-08: qty 1

## 2024-08-08 MED ORDER — SODIUM CHLORIDE 0.9 % IV SOLN
INTRAVENOUS | Status: DC | PRN
  Administered 2024-08-08: 30 mL

## 2024-08-08 MED ORDER — PROPOFOL 10 MG/ML IV BOLUS
INTRAVENOUS | Status: DC | PRN
  Administered 2024-08-08: 100 ug/kg/min via INTRAVENOUS
  Administered 2024-08-08: 30 mg via INTRAVENOUS

## 2024-08-08 MED ORDER — MORPHINE SULFATE (PF) 4 MG/ML IV SOLN
INTRAVENOUS | Status: AC
  Filled 2024-08-08: qty 1

## 2024-08-08 MED ORDER — BUPIVACAINE LIPOSOME 1.3 % IJ SUSP
INTRAMUSCULAR | Status: AC
Start: 2024-08-08 — End: 2024-08-08
  Filled 2024-08-08: qty 20

## 2024-08-08 MED ORDER — ACETAMINOPHEN 500 MG PO TABS: 1000.0000 mg | ORAL_TABLET | Freq: Three times a day (TID) | ORAL | 0 refills | Status: AC

## 2024-08-08 MED ORDER — PHENYLEPHRINE HCL-NACL 20-0.9 MG/250ML-% IV SOLN
INTRAVENOUS | Status: AC
  Filled 2024-08-08: qty 250

## 2024-08-08 MED ORDER — CHLORHEXIDINE GLUCONATE 0.12 % MT SOLN
15.0000 mL | Freq: Once | OROMUCOSAL | Status: AC
  Administered 2024-08-08: 15 mL via OROMUCOSAL

## 2024-08-08 MED ORDER — SODIUM CHLORIDE 0.9 % IV SOLN: INTRAVENOUS | Status: DC

## 2024-08-08 MED ORDER — DEXAMETHASONE SODIUM PHOSPHATE 10 MG/ML IJ SOLN: 8.0000 mg | Freq: Once | INTRAMUSCULAR | Status: DC

## 2024-08-08 MED ORDER — BUPIVACAINE HCL (PF) 0.5 % IJ SOLN
INTRAMUSCULAR | Status: AC
  Filled 2024-08-08: qty 10

## 2024-08-08 MED ORDER — SODIUM CHLORIDE (PF) 0.9 % IJ SOLN
INTRAMUSCULAR | Status: AC
Start: 2024-08-08 — End: 2024-08-08
  Filled 2024-08-08: qty 10

## 2024-08-08 MED ORDER — PREDNISONE 10 MG PO TABS
10.0000 mg | ORAL_TABLET | Freq: Every day | ORAL | Status: DC
  Administered 2024-08-09: 10 mg via ORAL
  Filled 2024-08-08 (×2): qty 1

## 2024-08-08 MED ORDER — METOCLOPRAMIDE HCL 5 MG PO TABS: 5.0000 mg | ORAL_TABLET | Freq: Three times a day (TID) | ORAL | Status: DC | PRN

## 2024-08-08 MED ORDER — PHENYLEPHRINE 80 MCG/ML (10ML) SYRINGE FOR IV PUSH (FOR BLOOD PRESSURE SUPPORT)
PREFILLED_SYRINGE | INTRAVENOUS | Status: DC | PRN
Start: 2024-08-08 — End: 2024-08-08
  Administered 2024-08-08: 80 ug via INTRAVENOUS

## 2024-08-08 MED ORDER — TRANEXAMIC ACID-NACL 1000-0.7 MG/100ML-% IV SOLN
1000.0000 mg | INTRAVENOUS | Status: AC
  Administered 2024-08-08 (×2): 1000 mg via INTRAVENOUS

## 2024-08-08 MED ORDER — IPRATROPIUM-ALBUTEROL 0.5-2.5 (3) MG/3ML IN SOLN: 3.0000 mL | Freq: Four times a day (QID) | RESPIRATORY_TRACT | Status: DC | PRN

## 2024-08-08 MED ORDER — BUPIVACAINE-EPINEPHRINE (PF) 0.25% -1:200000 IJ SOLN
INTRAMUSCULAR | Status: AC
  Filled 2024-08-08: qty 30

## 2024-08-08 MED ORDER — CELECOXIB 100 MG PO CAPS: 100.0000 mg | ORAL_CAPSULE | Freq: Two times a day (BID) | ORAL | 0 refills | Status: AC

## 2024-08-08 MED ORDER — TRAMADOL HCL 50 MG PO TABS
50.0000 mg | ORAL_TABLET | Freq: Four times a day (QID) | ORAL | Status: DC | PRN
  Administered 2024-08-08: 50 mg via ORAL
  Filled 2024-08-08: qty 1

## 2024-08-08 MED ORDER — ALPRAZOLAM 0.25 MG PO TABS: 0.2500 mg | ORAL_TABLET | Freq: Two times a day (BID) | ORAL | Status: DC | PRN

## 2024-08-08 MED ORDER — OXYCODONE HCL 5 MG PO TABS: 2.5000 mg | ORAL_TABLET | Freq: Three times a day (TID) | ORAL | 0 refills | Status: AC | PRN

## 2024-08-08 MED ORDER — ONDANSETRON HCL 4 MG PO TABS
4.0000 mg | ORAL_TABLET | Freq: Four times a day (QID) | ORAL | Status: DC | PRN
  Administered 2024-08-09: 4 mg via ORAL
  Filled 2024-08-08: qty 1

## 2024-08-08 MED ORDER — 0.9 % SODIUM CHLORIDE (POUR BTL) OPTIME
TOPICAL | Status: DC | PRN
  Administered 2024-08-08: 1000 mL

## 2024-08-08 MED ORDER — KETOROLAC TROMETHAMINE 15 MG/ML IJ SOLN
7.5000 mg | Freq: Four times a day (QID) | INTRAMUSCULAR | Status: AC
  Administered 2024-08-08 – 2024-08-09 (×3): 7.5 mg via INTRAVENOUS
  Filled 2024-08-08 (×3): qty 1

## 2024-08-08 MED ORDER — MORPHINE SULFATE (PF) 4 MG/ML IV SOLN
0.5000 mg | INTRAVENOUS | Status: DC | PRN
  Administered 2024-08-08: 1 mg via INTRAVENOUS
  Filled 2024-08-08: qty 1

## 2024-08-08 MED ORDER — ORAL CARE MOUTH RINSE
15.0000 mL | Freq: Once | OROMUCOSAL | Status: AC
Start: 2024-08-08 — End: 2024-08-08

## 2024-08-08 MED ORDER — METOCLOPRAMIDE HCL 5 MG/ML IJ SOLN: 5.0000 mg | Freq: Three times a day (TID) | INTRAMUSCULAR | Status: DC | PRN

## 2024-08-08 MED ORDER — DOCUSATE SODIUM 100 MG PO CAPS
100.0000 mg | ORAL_CAPSULE | Freq: Two times a day (BID) | ORAL | Status: DC
  Administered 2024-08-08 – 2024-08-09 (×2): 100 mg via ORAL
  Filled 2024-08-08 (×2): qty 1

## 2024-08-08 MED ORDER — ACETAMINOPHEN 10 MG/ML IV SOLN
INTRAVENOUS | Status: AC
  Filled 2024-08-08: qty 100

## 2024-08-08 MED ORDER — SPIRONOLACTONE 25 MG PO TABS
25.0000 mg | ORAL_TABLET | ORAL | Status: DC
  Administered 2024-08-09: 25 mg via ORAL
  Filled 2024-08-08 (×2): qty 1

## 2024-08-08 MED ORDER — INSULIN ASPART 100 UNIT/ML IJ SOLN
0.0000 [IU] | Freq: Three times a day (TID) | INTRAMUSCULAR | Status: DC
  Administered 2024-08-08 – 2024-08-09 (×2): 2 [IU] via SUBCUTANEOUS
  Filled 2024-08-08 (×2): qty 1

## 2024-08-08 MED ORDER — ACETAMINOPHEN 10 MG/ML IV SOLN
INTRAVENOUS | Status: DC | PRN
Start: 2024-08-08 — End: 2024-08-08
  Administered 2024-08-08: 1000 mg via INTRAVENOUS

## 2024-08-08 MED ORDER — HYDROCODONE-ACETAMINOPHEN 5-325 MG PO TABS
1.0000 | ORAL_TABLET | ORAL | Status: DC | PRN
  Administered 2024-08-08: 1 via ORAL
  Administered 2024-08-09: 2 via ORAL
  Filled 2024-08-08 (×3): qty 2

## 2024-08-08 MED ORDER — PHENOL 1.4 % MT LIQD: 1.0000 | OROMUCOSAL | Status: DC | PRN

## 2024-08-08 MED ORDER — BUPIVACAINE-EPINEPHRINE (PF) 0.25% -1:200000 IJ SOLN
INTRAMUSCULAR | Status: DC | PRN
Start: 2024-08-08 — End: 2024-08-08
  Administered 2024-08-08: 20 mL

## 2024-08-08 MED ORDER — MIDAZOLAM HCL 2 MG/2ML IJ SOLN
INTRAMUSCULAR | Status: AC
Start: 2024-08-08 — End: 2024-08-08
  Filled 2024-08-08: qty 2

## 2024-08-08 MED ORDER — CEFAZOLIN SODIUM-DEXTROSE 2-4 GM/100ML-% IV SOLN
2.0000 g | INTRAVENOUS | Status: AC
  Administered 2024-08-08: 2 g via INTRAVENOUS

## 2024-08-08 MED ORDER — ONDANSETRON HCL 4 MG/2ML IJ SOLN
4.0000 mg | Freq: Four times a day (QID) | INTRAMUSCULAR | Status: DC | PRN
  Administered 2024-08-08: 4 mg via INTRAVENOUS
  Filled 2024-08-08: qty 2

## 2024-08-08 MED ORDER — ONDANSETRON HCL 4 MG PO TABS: 4.0000 mg | ORAL_TABLET | Freq: Four times a day (QID) | ORAL | 0 refills | Status: AC | PRN

## 2024-08-08 MED ORDER — TRAMADOL HCL 50 MG PO TABS: 50.0000 mg | ORAL_TABLET | Freq: Four times a day (QID) | ORAL | 0 refills | Status: AC | PRN

## 2024-08-08 MED ORDER — ACETAMINOPHEN 500 MG PO TABS: 1000.0000 mg | ORAL_TABLET | Freq: Three times a day (TID) | ORAL | Status: DC

## 2024-08-08 MED ORDER — SODIUM CHLORIDE 0.9 % IR SOLN
Status: DC | PRN
Start: 2024-08-08 — End: 2024-08-08
  Administered 2024-08-08: 1000 mL

## 2024-08-08 MED ORDER — INSULIN ASPART 100 UNIT/ML IJ SOLN: 0.0000 [IU] | Freq: Every day | INTRAMUSCULAR | Status: DC

## 2024-08-08 MED ORDER — MENTHOL 3 MG MT LOZG: 1.0000 | LOZENGE | OROMUCOSAL | Status: DC | PRN

## 2024-08-08 MED ORDER — MIDAZOLAM HCL 5 MG/5ML IJ SOLN
INTRAMUSCULAR | Status: DC | PRN
  Administered 2024-08-08: 1 mg via INTRAVENOUS

## 2024-08-08 MED ORDER — FENTANYL CITRATE (PF) 100 MCG/2ML IJ SOLN: 25.0000 ug | INTRAMUSCULAR | Status: DC | PRN

## 2024-08-08 MED ORDER — ACETAMINOPHEN 325 MG PO TABS: 325.0000 mg | ORAL_TABLET | Freq: Four times a day (QID) | ORAL | Status: DC | PRN

## 2024-08-08 MED ORDER — PROPOFOL 1000 MG/100ML IV EMUL
INTRAVENOUS | Status: AC
  Filled 2024-08-08: qty 100

## 2024-08-08 MED ORDER — DOCUSATE SODIUM 100 MG PO CAPS: 100.0000 mg | ORAL_CAPSULE | Freq: Two times a day (BID) | ORAL | 0 refills | Status: AC

## 2024-08-08 MED ORDER — MONTELUKAST SODIUM 10 MG PO TABS
10.0000 mg | ORAL_TABLET | ORAL | Status: DC
  Administered 2024-08-09: 10 mg via ORAL
  Filled 2024-08-08 (×2): qty 1

## 2024-08-08 MED ORDER — SURGIPHOR WOUND IRRIGATION SYSTEM - OPTIME
TOPICAL | Status: DC | PRN
  Administered 2024-08-08: 450 mL via TOPICAL

## 2024-08-08 SURGICAL SUPPLY — 75 items
BIT DRILL 2.0 (BIT) IMPLANT
BIT DRILL TRIDENT 4X40 SU (BIT) IMPLANT
BLADE SAGITTAL AGGR TOOTH XLG (BLADE) ×1 IMPLANT
BNDG COHESIVE 6X5 TAN ST LF (GAUZE/BANDAGES/DRESSINGS) ×1 IMPLANT
BOWL CEMENT MIX W/ADAPTER (MISCELLANEOUS) IMPLANT
BOWL CEMENT MIXING ADV NOZZLE (MISCELLANEOUS) IMPLANT
BRUSH SCRUB EZ PLAIN DRY (MISCELLANEOUS) ×1 IMPLANT
CEMENT BONE SIMPLEX SPEEDSET (Cement) IMPLANT
CHLORAPREP W/TINT 26 (MISCELLANEOUS) ×2 IMPLANT
COVER SET STULBERG POSITIONER (MISCELLANEOUS) ×1 IMPLANT
CUP MEDICINE 2OZ PLAST GRAD ST (MISCELLANEOUS) IMPLANT
DERMABOND ADVANCED .7 DNX12 (GAUZE/BANDAGES/DRESSINGS) ×1 IMPLANT
DRAPE IMP U-DRAPE 54X76 (DRAPES) ×1 IMPLANT
DRAPE INCISE IOBAN 66X60 STRL (DRAPES) ×1 IMPLANT
DRAPE POUCH INSTRU U-SHP 10X18 (DRAPES) ×1 IMPLANT
DRAPE SHEET LG 3/4 BI-LAMINATE (DRAPES) ×1 IMPLANT
DRAPE TABLE BACK 80X90 (DRAPES) ×1 IMPLANT
DRAPE U-SHAPE 47X51 STRL (DRAPES) ×1 IMPLANT
DRSG MEPILEX SACRM 8.7X9.8 (GAUZE/BANDAGES/DRESSINGS) ×1 IMPLANT
DRSG OPSITE POSTOP 4X10 (GAUZE/BANDAGES/DRESSINGS) IMPLANT
DRSG OPSITE POSTOP 4X12 (GAUZE/BANDAGES/DRESSINGS) IMPLANT
DRSG OPSITE POSTOP 4X8 (GAUZE/BANDAGES/DRESSINGS) IMPLANT
ELECTRODE REM PT RTRN 9FT ADLT (ELECTROSURGICAL) ×1 IMPLANT
GLOVE BIO SURGEON STRL SZ8 (GLOVE) ×1 IMPLANT
GLOVE BIOGEL PI IND STRL 8 (GLOVE) ×1 IMPLANT
GLOVE PI ORTHO PRO STRL 7.5 (GLOVE) ×2 IMPLANT
GLOVE PI ORTHO PRO STRL SZ8 (GLOVE) ×2 IMPLANT
GLOVE SURG SYN 6.5 PF PI (GLOVE) IMPLANT
GLOVE SURG SYN 7.5 PF PI (GLOVE) ×2 IMPLANT
GOWN SRG XL LVL 3 NONREINFORCE (GOWNS) ×1 IMPLANT
GOWN STRL REUS W/ TWL LRG LVL3 (GOWN DISPOSABLE) ×1 IMPLANT
GOWN STRL REUS W/ TWL XL LVL3 (GOWN DISPOSABLE) ×1 IMPLANT
HANDLE YANKAUER SUCT OPEN TIP (MISCELLANEOUS) ×1 IMPLANT
HEAD FEMORA LFIT V40 22.2 +3 (Miscellaneous) IMPLANT
HOOD PEEL AWAY T7 (MISCELLANEOUS) ×2 IMPLANT
INSERT MDM LINER 22.2X38 38D (Insert) IMPLANT
IV NS 100ML SINGLE PACK (IV SOLUTION) ×1 IMPLANT
KIT PREP HIP W/CEMENT RESTRICT (Miscellaneous) IMPLANT
KIT TURNOVER KIT A (KITS) ×1 IMPLANT
LINER CEMENTLESS SZ D 38 HIP (Liner) IMPLANT
MANIFOLD NEPTUNE II (INSTRUMENTS) ×1 IMPLANT
MARKER SKIN DUAL TIP RULER LAB (MISCELLANEOUS) ×1 IMPLANT
MAT ABSORB FLUID 56X50 GRAY (MISCELLANEOUS) ×1 IMPLANT
NDL FILTER BLUNT 18X1 1/2 (NEEDLE) ×1 IMPLANT
NDL SAFETY ECLIPSE 18X1.5 (NEEDLE) ×1 IMPLANT
NDL SPNL 20GX3.5 QUINCKE YW (NEEDLE) ×1 IMPLANT
NEEDLE FILTER BLUNT 18X1 1/2 (NEEDLE) ×1 IMPLANT
NEEDLE SPNL 20GX3.5 QUINCKE YW (NEEDLE) ×1 IMPLANT
PACK HIP PROSTHESIS (MISCELLANEOUS) ×1 IMPLANT
PAD ARMBOARD POSITIONER FOAM (MISCELLANEOUS) ×2 IMPLANT
PENCIL SMOKE EVACUATOR (MISCELLANEOUS) ×1 IMPLANT
PILLOW ABDUCTION FOAM SM (MISCELLANEOUS) ×1 IMPLANT
RETRIEVER SUT HEWSON (MISCELLANEOUS) ×1 IMPLANT
SCREW HEX LP 6.5X20 (Screw) IMPLANT
SCREW HEX LP 6.5X25 (Screw) IMPLANT
SHELL TRIDENT II CLUST 50 (Shell) IMPLANT
SLEEVE SCD COMPRESS KNEE MED (STOCKING) ×1 IMPLANT
SOL .9 NS 3000ML IRR UROMATIC (IV SOLUTION) ×1 IMPLANT
SOLUTION IRRIG SURGIPHOR (IV SOLUTION) ×1 IMPLANT
SPACER DISTAL MED 16 (Spacer) IMPLANT
STEM HIP ACCOLADE SZ4 35X137 (Stem) IMPLANT
SUT BONE WAX W31G (SUTURE) ×1 IMPLANT
SUT ETHIBOND #5 BRAIDED 30INL (SUTURE) ×1 IMPLANT
SUT STRATAFIX 14 PDO 36 VLT (SUTURE) ×1 IMPLANT
SUT VIC AB 0 CT1 36 (SUTURE) ×1 IMPLANT
SUT VIC AB 2-0 CT2 27 (SUTURE) ×2 IMPLANT
SUTURE STRATA SPIR 4-0 18 (SUTURE) ×1 IMPLANT
SUTURE VICRYL 1-0 27IN ABS (SUTURE) ×1 IMPLANT
SYR 20ML LL LF (SYRINGE) ×2 IMPLANT
SYR TB 1ML LL NO SAFETY (SYRINGE) ×1 IMPLANT
TIP FAN IRRIG PULSAVAC PLUS (DISPOSABLE) ×1 IMPLANT
TOWEL OR 17X26 4PK STRL BLUE (TOWEL DISPOSABLE) IMPLANT
TRAP FLUID SMOKE EVACUATOR (MISCELLANEOUS) ×1 IMPLANT
WAND WEREWOLF FASTSEAL 6.0 (MISCELLANEOUS) ×1 IMPLANT
WATER STERILE IRR 1000ML POUR (IV SOLUTION) ×1 IMPLANT

## 2024-08-08 NOTE — TOC Initial Note (Signed)
 Transition of Care [facility]) - Initial/Assessment Note    Patient Details  Name: Laura [name] MRN: [id number] Date of Birth: [birth year]  Transition of Care [facility]) CM/SW Contact:    Delphine KANDICE Bring, RN Phone Number: 08/08/2024, 3:08 PM  Clinical Narrative:                 Patient had surgery this morning. No family at bedside. Patient states that she has rolliator, w/c , scooter, BSC, bed rails, CPAP, and jet neb. Patient plan to go home at time of d/c . Patient states she had adoration in the past and  would like to keep them. Family will transport at time of d/c   CM received a call from [place] at [facility]. He states that they will be providing therapy and nursing for patient at time of d/c   Expected Discharge Plan: Home w Home Health Services     Patient Goals and CMS Choice            Expected Discharge Plan and Services       Living arrangements for the past 2 months: Single Family Home                   DME Agency: [employer] (Home patient)                  Prior Living Arrangements/Services Living arrangements for the past 2 months: Single Family Home Lives with:: Adult Children, Spouse Patient language and need for interpreter reviewed:: No (English) Do you feel safe going back to the place where you live?: Yes        Care giver support system in place?: Yes (comment) Current home services: DME    Activities of Daily Living   ADL Screening (condition at time of admission) Independently performs ADLs?: Yes (appropriate for developmental age) Is the patient deaf or have difficulty hearing?: No Does the patient have difficulty seeing, even when wearing glasses/contacts?: No Does the patient have difficulty concentrating, remembering, or making decisions?: No  Permission Sought/Granted                  Emotional Assessment Appearance:: Appears stated age Attitude/Demeanor/Rapport: Other (comment) (Cooperative) Affect (typically observed):  Accepting, Calm Orientation: : Oriented to Self, Oriented to Place, Oriented to  Time, Oriented to Situation      Admission diagnosis:  Primary osteoarthritis of right hip [M16.11] S/P total right hip arthroplasty [Z96.641] Patient Active Problem List   Diagnosis Date Noted   S/P total right hip arthroplasty 08/08/2024   Pain of right sacroiliac joint 01/11/2024   Diabetes mellitus (HCC) 12/14/2023   Acute on chronic systolic congestive heart failure (HCC) 05/15/2023   Inflammation of sacroiliac joint (HCC) 02/24/2023   Right foot drop 02/24/2023   Spasm of back muscles 02/24/2023   Spondylosis without myelopathy 02/24/2023   Pain in joint of left shoulder 03/31/2022   Stiffness of left shoulder joint 03/31/2022   Rotator cuff tear arthropathy 03/28/2022   Impingement syndrome of right shoulder region 10/07/2021   Localized, primary osteoarthritis of shoulder region 10/07/2021   Hyperlipidemia LDL goal <70 11/23/2020   NICM (nonischemic cardiomyopathy) (HCC)    Acute R MCA ischemic stroke (HCC) s/p revascularization R M1, embolic d/t known AF 11/20/2020   Middle cerebral artery embolism, right 11/20/2020   Strain of extensor or abductor muscles, fascia and tendons of unspecified thumb at forearm level, initial encounter 06/12/2020   Lymphedema 06/07/2020   Acute right-sided low back  pain with right-sided sciatica 05/21/2020   Chronic systolic heart failure (HCC)    Pulmonary hypertension, unspecified (HCC)    Nutritional deficiency 01/02/2020   Memory deficit 01/02/2020   Encounter for therapeutic drug level monitoring 01/02/2020   B12 deficiency 01/02/2020   PAD (peripheral artery disease) (HCC) 12/05/2019   Intractable vomiting 10/09/2019   Intestinal ischemia (HCC)    COPD (chronic obstructive pulmonary disease) (HCC)    Atrial fibrillation with rapid ventricular response (HCC) 09/11/2019   Enteritis    Peripheral edema 07/17/2019   Atherosclerosis of both carotid  arteries 07/17/2019   Varicose veins of right lower extremity with inflammation 06/08/2019   Gastroesophageal reflux disease with esophagitis 05/08/2019   Fall at home, initial encounter 03/16/2019   Contusion of right lower leg 03/16/2019   Flu-like symptoms 02/03/2019   Nausea 02/03/2019   Suprapatellar bursitis of right knee 01/23/2019   Pain in right leg 01/23/2019   Cellulitis of right leg 01/23/2019   Chronic venous stasis dermatitis of right lower extremity 01/23/2019   Oropharyngeal candidiasis 01/23/2019   Cellulitis of head except face 12/23/2018   Screening for breast cancer 08/15/2018   Chronic bronchitis with acute exacerbation (HCC) 08/15/2018   Vitamin D  deficiency 08/15/2018   Need for vaccination against Streptococcus pneumoniae using pneumococcal conjugate vaccine 13 08/15/2018   Obstructive chronic bronchitis without exacerbation (HCC) 07/14/2018   Morbid obesity (HCC) 03/17/2018   Asthma 02/25/2018   Generalized anxiety disorder 12/16/2017   Essential hypertension 12/16/2017   Chronic respiratory failure with hypoxia (HCC) 12/16/2017   Long term (current) use of anticoagulants 12/16/2017   Chronic atrial fibrillation (HCC) 12/16/2017   Other specified functional intestinal disorders 12/16/2017   Gastro-esophageal reflux disease without esophagitis 12/16/2017   OSA on CPAP 12/16/2017   Allergic rhinitis due to animal (cat) (dog) hair and dander 12/16/2017   Allergic rhinitis due to pollen 12/16/2017   Severe persistent asthma without complication 12/16/2017   Cough 12/16/2017   Shortness of breath 12/16/2017   Superficial thrombophlebitis of right leg 11/17/2016   Pain in limb 11/17/2016   Chronic venous insufficiency 11/17/2016   Swelling of limb 11/17/2016   Cellulitis of buttock    Cellulitis of right axilla    Sepsis (HCC) 06/28/2016   Cellulitis and abscess 06/28/2016   Hypokalemia 06/28/2016   Acute renal insufficiency 06/28/2016   Hyperglycemia  06/28/2016   Leukocytosis 06/28/2016   Hypoxia 06/28/2016   Collagenous colitis 05/13/2016   PCP:  Pcp, No Pharmacy:   [facility] [address] [place] - [address] [place] [address] Phone: [phone] Fax: [phone]  [facility] [place]  [place], [place] - [address] [facility] [address] [facility] [address] [place] [phone] Phone: [phone] Fax: [phone]  [facility] - [place], [place] - [address] [facility] [employer] [address] [place] [address] Phone: [phone] Fax: [phone]  [phone] [place], [place] - [address] [place] [address] Phone: [phone] Fax: [phone]     Social Drivers of Health (SDOH) Social History: SDOH Screenings   Food Insecurity: No Food Insecurity (08/08/2024)  Housing: Low Risk  (08/08/2024)  Transportation Needs: No Transportation Needs (08/08/2024)  Utilities: Not At Risk (08/08/2024)  Alcohol Screen: Low Risk  (11/11/2021)  Depression (PHQ2-9): Low Risk  (08/10/2023)  Financial Resource Strain: Low Risk  (05/09/2024)   Received from [facility]  Physical Activity:  Sufficiently Active (06/27/2021)  Recent Concern: Physical Activity - Insufficiently Active (05/31/2021)  Social Connections: Socially Integrated (08/08/2024)  Stress: No Stress Concern Present (06/27/2021)  Tobacco Use: Low Risk  (08/08/2024)   SDOH Interventions:     Readmission Risk Interventions     No data to display

## 2024-08-08 NOTE — Anesthesia Preprocedure Evaluation (Signed)
 Anesthesia Evaluation  Patient identified by MRN, date of birth, ID band Patient awake    Reviewed: Allergy & Precautions, NPO status , Patient's Chart, lab work & pertinent test results  Airway Mallampati: III  TM Distance: <3 FB Neck ROM: full    Dental  (+) Chipped, Poor Dentition, Missing   Pulmonary neg shortness of breath, asthma , sleep apnea, Continuous Positive Airway Pressure Ventilation and Oxygen  sleep apnea , COPD    + decreased breath sounds      Cardiovascular Exercise Tolerance: Good hypertension, +CHF  + dysrhythmias Atrial Fibrillation  Rate:Normal     Neuro/Psych  PSYCHIATRIC DISORDERS       Neuromuscular disease CVA (left side), Residual Symptoms    GI/Hepatic Neg liver ROS,GERD  Controlled,,  Endo/Other  diabetes, Type 2    Renal/GU Renal disease     Musculoskeletal   Abdominal   Peds  Hematology negative hematology ROS (+)   Anesthesia Other Findings Patient has cardiac clearance for this procedure.   Past Medical History: 11/20/2020: Acute ischemic left MCA stroke [facility])     Comment:  a.) brain MRI 11/20/2020 - acute M1 MCA occlusion -->               embolectomy; (+) residual LEFT  sided weakness No date: Anxiety     Comment:  a.) on BZO PRN (alprazolam ) No date: Aortic atherosclerosis (HCC) No date: Aortic root dilatation (HCC) No date: Asthma No date: Bilateral carotid artery disease (HCC) No date: Cardiomegaly 09/2016: Chronic atrial fibrillation (HCC)     Comment:  a.) Dx'd 09/2016; b.) failed flecainide and propafenone               due to LE swelling and SOB, could not afford Multaq; c.)               CHA2DS2VASc = 8 (age, sex, HFrEF, HTN, CVA x 2, vascular               disease, T2DM) as of 08/04/2024; d.) rate/rhythm               maintained on oral metoprolol  succinate; chronically               anticoagulated with apixaban  No date: Compression fracture of L1 vertebra  (chronic) No date: COPD (chronic obstructive pulmonary disease) (HCC) No date: DDD (degenerative disc disease), thoracic No date: Dependence on supplemental oxygen      Comment:  a.) 2 L/[place] PRN; 3L bled in with CPAP at bedtime No date: DM (diabetes mellitus), type 2 (HCC) No date: Family history of adverse reaction to anesthesia     Comment:  a.) PONV in 2nd degree female relative (granddaughter) No date: GERD (gastroesophageal reflux disease) No date: HFmrEF (heart failure with mid-range ejection fraction) (HCC)     Comment:  a. 12/2019 Echo: EF 40-45%. b. 01/2022 Echo: EF 40-45%. 10/10/2019: History of 2019 novel coronavirus disease (COVID-19) No date: History of methicillin resistant staphylococcus aureus (MRSA) No date: Hyperlipidemia No date: Hypertension No date: Long-term corticosteroid use (predinsone x 10+ years) No date: NICM (nonischemic cardiomyopathy) (HCC)     Comment:  a. 12/2019 Echo: EF 40-45%, glob HK, mildly reduced RV               fxn, sev dil LA. *HR 130 (afib) during study. No date: Nonobstructive CAD (coronary artery disease)     Comment:  a. Lexiscan Myoview 10/2016: no evidence of ischemia, EF  53%; b. 02/2020 Cath: LM nl, LAD 20p, 43m, LCX 20ost,               OM1/2/3 nl, LPDA nl, LPL1/2 nl, LPAV nl, RCA small, nl. 11/20/2020: Nose colonized with MRSA     Comment:  a.) presurgical PCR (+) 11/20/2020 prior to IR CEREBRAL               EMBOLECTOMY No date: Obesity No date: On apixaban  therapy No date: OSA on CPAP     Comment:  a.) requires 3L O2 bled in at bedtime No date: Osteoarthritis No date: Osteoporosis No date: Pulmonary hypertension (HCC) No date: Rotator cuff arthropathy of both shoulders 02/03/2024: Spinal stenosis of lumbar region No date: Status post bilateral cataract extraction  Past Surgical History: 09/11/2019: BOWEL RESECTION     Comment:  Procedure: SMALL BOWEL RESECTION;  Surgeon:               Rodolph Romano, MD;   Location: [facility] ORS;  Service:              General;; No date: CATARACT EXTRACTION; Bilateral 03/19/2020: COLONOSCOPY WITH PROPOFOL ; N/A     Comment:  Procedure: COLONOSCOPY WITH PROPOFOL ;  Surgeon: Therisa Bi, MD;  Location[facility];  Service:               Gastroenterology;  Laterality: N/A; 06/29/2016: INCISION AND DRAINAGE ABSCESS; Right     Comment:  Procedure: INCISION AND DRAINAGE ABSCESS;  Surgeon:               Charlie FORBES Fell, MD;  Location: [facility] ORS;  Service:               General;  Laterality: Right; 06/29/2016: INCISION AND DRAINAGE OF WOUND; Left     Comment:  Procedure: IRRIGATION AND DEBRIDEMENT WOUND;  Surgeon:               Charlie FORBES Fell, MD;  Location: [facility] ORS;  Service:               General;  Laterality: Left; 11/20/2020: IR ANGIO VERTEBRAL SEL SUBCLAVIAN INNOMINATE UNI R MOD SED 11/20/2020: IR CT HEAD LTD 11/20/2020: IR PERCUTANEOUS ART THROMBECTOMY/INFUSION INTRACRANIAL  INC DIAG ANGIO 09/11/2019: LAPAROSCOPIC RIGHT COLECTOMY     Comment:  Procedure: RIGHT COLECTOMY;  Surgeon: Rodolph Romano, MD;  Location: [facility] ORS;  Service: General;; 09/11/2019: LAPAROSCOPY; N/A     Comment:  Procedure: LAPAROSCOPY DIAGNOSTIC;  Surgeon:               Rodolph Romano, MD;  Location: [facility] ORS;  Service:              General;  Laterality: N/A; 09/13/2019: LAPAROTOMY; N/A     Comment:  Procedure: REOPENING OF RECENT LAPAROTOMYANASTOMOSIS OF               BOWEL;  Surgeon: Rodolph Romano, MD;  Location:               [facility] ORS;  Service: General;  Laterality: N/A; 11/20/2020: RADIOLOGY WITH ANESTHESIA; N/A     Comment:  Procedure: IR WITH ANESTHESIA - CODE STROKE;  Surgeon:               Radiologist, Medication, MD;  Location: [facility] OR;  Service:  Radiology;  Laterality: N/A; 02/27/2020: RIGHT/LEFT HEART CATH AND CORONARY ANGIOGRAPHY; Bilateral     Comment:  Procedure: RIGHT/LEFT HEART CATH AND CORONARY                ANGIOGRAPHY;  Surgeon: Darron Deatrice LABOR, MD;  Location:               [facility] INVASIVE CV LAB;  Service: Cardiovascular;                Laterality: Bilateral; 09/12/2019: VISCERAL ANGIOGRAPHY; N/A     Comment:  Procedure: VISCERAL ANGIOGRAPHY;  Surgeon: Marea Selinda RAMAN,              MD;  Location: [facility] INVASIVE CV LAB;  Service:               Cardiovascular;  Laterality: N/A;     Reproductive/Obstetrics negative OB ROS                              Anesthesia Physical Anesthesia Plan  ASA: 3  Anesthesia Plan: Spinal   Post-op Pain Management:    Induction:   PONV Risk Score and Plan:   Airway Management Planned: Natural Airway and Nasal Cannula  Additional Equipment:   Intra-op Plan:   Post-operative Plan:   Informed Consent: I have reviewed the patients History and Physical, chart, labs and discussed the procedure including the risks, benefits and alternatives for the proposed anesthesia with the patient or authorized representative who has indicated his/her understanding and acceptance.     Dental Advisory Given  Plan Discussed with: Anesthesiologist, CRNA and Surgeon  Anesthesia Plan Comments: (Patient reports no bleeding problems and that she appropriately held her anticoagulant.  Plan for spinal with backup GA  Patient consented for risks of anesthesia including but not limited to:  - adverse reactions to medications - damage to eyes, teeth, lips or other oral mucosa - nerve damage due to positioning  - risk of bleeding, infection and or nerve damage from spinal that could lead to paralysis - risk of headache or failed spinal - damage to teeth, lips or other oral mucosa - sore throat or hoarseness - damage to heart, brain, nerves, lungs, other parts of body or loss of life  Patient voiced understanding and assent.)        Anesthesia Quick Evaluation

## 2024-08-08 NOTE — Op Note (Signed)
 Patient Name: Laura [name]  FMW:[id number]  Pre-Operative Diagnosis: Right hip Osteoarthritis  Post-Operative Diagnosis: (same)  Procedure: Right Cemented Hybrid Total Hip Arthroplasty  Components/Implants: Cup: Trident Tritanium Clusterhole 50D w/ x2 screws    Liner: MDM 38/D  Stem: Accolade C 127 deg #4  with distal spacer Head:LFIT 22.2 +44mm inner ball, MDM X3 38D/22.2  Date of Surgery: 08/08/2024  Surgeon: Arthea Sheer MD  Assistant: Debby Amber PA (present and scrubbed throughout the case, critical for assistance with exposure, retraction, instrumentation, and closure)   Anesthesiologist: Piscitello  Anesthesia: Spinal   IVF:400cc  EBL: 150cc  Complications: None   Brief history: The patient is a 73 year old female with a history of osteoarthritis of the right hip with pain limiting their range of motion and activities of daily living, which has failed multiple attempts at conservative therapy.  The risks and benefits of total hip arthroplasty as definitive surgical treatment were discussed with the patient, who opted to proceed with the operation.  After outpatient medical clearance and optimization was completed the patient was admitted to [facility] for the procedure.  All preoperative films were reviewed and an appropriate surgical plan was made prior to surgery.   Description of procedure: The patient was brought to the operating room where laterality was confirmed by all those present to be the right side.  The patient was moved to the table and administered spinal anesthesia. Patient was given an intravenous dose of antibiotics for surgical prophylaxis and TXA. The patient was positioned in lateral decubitus position with all bony prominences well-padded.  Surgical site was prepped with alcohol and chlorhexidine .  Surgical site over the hip was draped in typical sterile fashion with multiple layers of adhesive and nonadhesive drapes.  The  incision site over the greater trochanter posteriorly was marked out with a sterile marker.   Surgical timeout was then called with participation of all staff in the room the patient was confirmed and laterality again confirmed.  An incision was made over the lateral aspect of the hip cheating posteriorly on the proximal aspect.  Careful soft tissue dissection and coagulation of all bleeders was carried out down to the level of the glut max fascia.  The fascia was carefully incised in line with the femur.  A Charnley retractor was placed deep to the fascia with care taken to ensure that there was no nerve entrapment in the retractor.  The bursa tissue was taken down over the posterior femur exposing the external rotators.  A dull Cobra retractor was placed under the abductor mechanism to protect the mechanism and fully expose the piriformis and short external rotators.  The external rotators were carefully detached from the femur with electrocautery and tagged with Ethibond sutures.  The capsule to the hip was incised and tagged with sutures.  The femur was then carefully dislocated and cobra retractors were placed superior and inferior to the femoral neck.  The x-ray template was reviewed and electrocautery was used to mark out the femoral neck resection.   After the femoral neck and head were resected attention was then turned to the acetabulum.  An anterior acetabular retractor was placed and posterior retractor was placed to fully visualize the acetabulum.  The labrum was removed with electrocautery and the pulnivar was removed.  The acetabulum was then reamed sequentially up to a size 50 cup which allowed for good bony coverage and stability.  The size 50 acetabular component was then opened and implanted.  A drill was  used to carefully place the 2 screws into the acetabular component. The acetabulum was irrigated and a real liner was placed impacted and checked for stability.  Attention was turned back  to the femur and a femoral neck retractor was placed.  The femur was opened with a box osteotome and canal finder.  The femur was then sequentially broached up to a size 4 broach which allowed for good fit and fill.  A calcar planer was then used to smooth out the calcar.  A trial neck and head were then attached and the hip was reduced.  The hip was found to be stable on reduction with full range of motion without subluxation or dislocation and leg lengths felt equal.  The hip was then carefully dislocated head and neck trial was removed.    The femur was then exposed the broach was removed the canal was irrigated and a distal spacer was placed.  Anesthesia was alerted to plan for cement.  The canal was scrubbed dried and a suction tampon was placed.  Cement was mixed under vacuum and applied in retrograde fashion into the femoral canal the implant was placed and held in appropriate version until the cement fully cured.  A trial ball was then placed and the hip was taken through range of motion and found to be stable.  The trunnion was cleaned and a real ball until mobility articulation were implanted.  The hip was reduced after irrigating the acetabulum to make sure was cleared of any debris.  The hip showed good range of motion and stability on testing with both stability in flexion internal rotation and extension external rotation.  The hip was then irrigated with betadine based surgiphor solution and pulsatile lavage with saline.  A 2 mm drill bit was then used to make 2 holes in the greater trochanter the piriformis and capsular tissues were reapproximated and passed through the drill holes and tied over the greater trochanter.  The fascia was then approximated with #1 Vicryl and #2 barbed suture.  The subcutaneous tissues and skin were closed with 0 Vicryl 2-0 Vicryl and 4-0 barbed monofilament suture and the skin closed with Dermabond.  A sterile dressing was then applied.  Lap, sharps, and sponge counts  were correct at the end of the case.   The patient was then rolled supine and an x-ray was taken in the operating room. Leg lengths were clinically equal on examination with a good distal pulse. Components appeared in good position with no fractures noted on x-ray.  Patient was then transferred to a hospital bed and transferred to the recovery room in stable condition.

## 2024-08-08 NOTE — Interval H&P Note (Signed)
 Patient history and physical updated. Consent reviewed including risks, benefits, and alternatives to surgery. Patient agrees with above plan to proceed with right hip posterior cemented total hip arthroplasty.

## 2024-08-08 NOTE — Discharge Instructions (Signed)
Instructions after Posterior Total Hip Replacement        Dr. Serita Butcher., M.D.      Dept. of [facility]  [address], [place]  [address]  Phone: [phone]   Fax: [phone]    DIET: Drink plenty of non-alcoholic fluids. Resume your normal diet. Include foods high in fiber.  ACTIVITY:  You may use crutches or a walker with weight-bearing as tolerated, unless instructed otherwise. You may be weaned off of the walker or crutches by your Physical Therapist.  Do NOT reach below the level of your knees or cross your legs until allowed.    Continue doing gentle exercises. Exercising will reduce the pain and swelling, increase motion, and prevent muscle weakness.   Please continue to use the TED compression stockings for 2 weeks. You may remove the stockings at night, but should reapply them in the morning. Do not drive or operate any equipment until instructed.  WOUND CARE:  Continue to use ice packs periodically to reduce pain and swelling. You may shower with honeycomb dressing 3 days after surgery. Do not submerge incision site under water. Remove honeycomb dressing 7 days after surgery and allow dermabond to fall off on its own.   MEDICATIONS: You may resume your regular medications. Please take the pain medication as prescribed on the medication list. Do not take pain medication on an empty stomach. Pain medications and iron supplements can cause constipation. Use a stool softener (Senokot or Colace) on a daily basis and a laxative (dulcolax or miralax) as needed. Do not drive or drink alcoholic beverages when taking pain medications.   POSTOPERATIVE CONSTIPATION PROTOCOL Constipation - defined medically as fewer than three stools per week and severe constipation as less than one stool per week.  One of the most common issues patients have following surgery is constipation.  Even if you have a regular bowel  pattern at home, your normal regimen is likely to be disrupted due to multiple reasons following surgery.  Combination of anesthesia, postoperative narcotics, change in appetite and fluid intake all can affect your bowels.  In order to avoid complications following surgery, here are some recommendations in order to help you during your recovery period.  Colace (docusate) - Pick up an over-the-counter form of Colace or another stool softener and take twice a day as long as you are requiring postoperative pain medications.  Take with a full glass of water daily.  If you experience loose stools or diarrhea, hold the colace until you stool forms back up.  If your symptoms do not get better within 1 week or if they get worse, check with your doctor.  Dulcolax (bisacodyl) - Pick up over-the-counter and take as directed by the product packaging as needed to assist with the movement of your bowels.  Take with a full glass of water.  Use this product as needed if not relieved by Colace only.   MiraLax (polyethylene glycol) - Pick up over-the-counter to have on hand.  MiraLax is a solution that will increase the amount of water in your bowels to assist with bowel movements.  Take as directed and can mix with a glass of water, juice, soda, coffee, or tea.  Take if you go more than two days without a movement. Do not use MiraLax more than once per day. Call your doctor if you are still constipated or irregular after using this medication for 7 days in a row.  If you continue to have problems with postoperative constipation, please contact the office for further assistance and recommendations.  If you experience "the worst abdominal pain ever" or develop nausea or vomiting, please contact the office immediatly for further recommendations for treatment.   CALL THE OFFICE FOR: Temperature above 101 degrees Excessive bleeding or drainage on the dressing. Excessive swelling, coldness, or paleness of the  toes. Persistent nausea and vomiting.  FOLLOW-UP:  You should have an appointment to return to the office in 2 weeks after surgery. Arrangements have been made for continuation of Physical Therapy (either home therapy or outpatient therapy).

## 2024-08-08 NOTE — Evaluation (Signed)
 Physical Therapy Evaluation Patient Details Name: Laura [name] MRN: [id number] DOB: [birth year] Today's Date: 08/08/2024  History of Present Illness  Pt is s/p R THA. She is POD 0 at time of evaluation. PMH includes: CVA (L sided weakness), COPD, DM, GERD, HLD, HTN, and pulmonary HTN.  Clinical Impression  Pt is a pleasant 73  year old female who was admitted for R THA. Pt performs bed mobility, transfers, and ambulation with min physical assistance. Pt requires verbal cuing for walker management and to adhere to posterior hip precautions.PLOF was modified independent with ADLs and use of rollator for ambulation. Pt was educated about posterior hip precautions and demonstrated understanding. Able to repeat 3/3 precautions at the end of session. Pt demonstrates deficits with strength, ROM and activity tolerance. Would benefit from skilled PT to address above deficits and promote optimal return to PLOF.         If plan is discharge home, recommend the following: A little help with walking and/or transfers;A little help with bathing/dressing/bathroom;Assistance with cooking/housework;Assist for transportation;Help with stairs or ramp for entrance   Can travel by private vehicle        Equipment Recommendations Rolling walker (2 wheels)  Recommendations for Other Services       Functional Status Assessment Patient has had a recent decline in their functional status and demonstrates the ability to make significant improvements in function in a reasonable and predictable amount of time.     Precautions / Restrictions Precautions Precautions: Posterior Hip Precaution Booklet Issued: No Recall of Precautions/Restrictions: Intact Restrictions Weight Bearing Restrictions Per Provider Order: Yes RLE Weight Bearing Per Provider Order: Weight bearing as tolerated      Mobility  Bed Mobility Overal bed mobility: Needs Assistance Bed Mobility: Sidelying to Sit   Sidelying to sit: Min  assist, HOB elevated, Used rails       General bed mobility comments: Pt able to perform bed mobility with min physical assistance to manage her LEs/adhere to precautions. Pt required HOB elevated and use of hand rails. Verbal cues for positioning and sequencing    Transfers Overall transfer level: Needs assistance Equipment used: Rolling walker (2 wheels) Transfers: Sit to/from Stand, Bed to chair/wheelchair/BSC Sit to Stand: Min assist   Step pivot transfers: Min assist       General transfer comment: Pt demonstrates STS at EOB with min physical assistance. Able to step pivot to chair with min A for sequencing. Verbal cues to adhere to posterior hip precautions.    Ambulation/Gait Ambulation/Gait assistance: Min assist Gait Distance (Feet): 60 Feet Assistive device: Rolling walker (2 wheels) Gait Pattern/deviations: Step-through pattern Gait velocity: decreased     General Gait Details: Pt able to ambulate with min A for safety/line management. Pt required min verbal cuing for RW management and to adhere to posterior hip precautions.  Stairs            Wheelchair Mobility     Tilt Bed    Modified Rankin (Stroke Patients Only)       Balance Overall balance assessment: Mild deficits observed, not formally tested                                           Pertinent Vitals/Pain Pain Assessment Pain Assessment: No/denies pain    Home Living Family/patient expects to be discharged to:: Private residence Living Arrangements: Children;Other relatives Available Help at  Discharge: Family Type of Home: House Home Access: Ramped entrance       Home Layout: One level Home Equipment: Rollator (4 wheels);Standard Walker;BSC/3in1;Tub bench;Wheelchair - power;Hand held shower head      Prior Function Prior Level of Function : Independent/Modified Independent             Mobility Comments: Pt states that a few days prior to sx she was using  the electric wc for mobility to avoid falls. Before the wc she was using the rollator for ambulation. ADLs Comments: Pt states she was independent with ADLs.     Extremity/Trunk Assessment   Upper Extremity Assessment Upper Extremity Assessment: Overall WFL for tasks assessed    Lower Extremity Assessment Lower Extremity Assessment: Generalized weakness    Cervical / Trunk Assessment Cervical / Trunk Assessment: Normal  Communication   Communication Communication: No apparent difficulties    Cognition Arousal: Alert Behavior During Therapy: WFL for tasks assessed/performed   PT - Cognitive impairments: No apparent impairments                       PT - Cognition Comments: Pt very pleasant and agreeable to PT. Following commands: Intact       Cueing Cueing Techniques: Verbal cues, Gestural cues, Visual cues     General Comments      Exercises Other Exercises Other Exercises: Edu on posterior hip precautions. Donned abduction pillow in chair.   Assessment/Plan    PT Assessment Patient needs continued PT services  PT Problem List Decreased strength;Decreased range of motion;Decreased activity tolerance;Decreased balance;Decreased mobility       PT Treatment Interventions DME instruction;Gait training;Stair training;Functional mobility training;Therapeutic activities;Therapeutic exercise;Balance training;Neuromuscular re-education;Patient/family education    PT Goals (Current goals can be found in the Care Plan section)  Acute Rehab PT Goals Patient Stated Goal: To move better PT Goal Formulation: With patient Time For Goal Achievement: 08/22/24 Potential to Achieve Goals: Good    Frequency BID     Co-evaluation               AM-PAC PT 6 Clicks Mobility  Outcome Measure Help needed turning from your back to your side while in a flat bed without using bedrails?: A Little Help needed moving from lying on your back to sitting on the side of  a flat bed without using bedrails?: A Little Help needed moving to and from a bed to a chair (including a wheelchair)?: A Little Help needed standing up from a chair using your arms (e.g., wheelchair or bedside chair)?: A Little Help needed to walk in hospital room?: A Little Help needed climbing 3-5 steps with a railing? : A Lot 6 Click Score: 17    End of Session Equipment Utilized During Treatment: Gait belt Activity Tolerance: Patient tolerated treatment well Patient left: in chair;with call bell/phone within reach;with family/visitor present Nurse Communication: Mobility status PT Visit Diagnosis: Muscle weakness (generalized) (M62.81);Difficulty in walking, not elsewhere classified (R26.2);Unsteadiness on feet (R26.81);History of falling (Z91.81)    Time: 8470-8396 PT Time Calculation (min) (ACUTE ONLY): 34 min   Charges:                 Joanne Salah, SPT   Lysle Yero 08/08/2024, 4:49 PM

## 2024-08-08 NOTE — Discharge Summary (Signed)
 Physician Discharge Summary  Patient ID: Laura [name] MRN: [id number] DOB/AGE: 1951-11-09 73 y.o.  Admit date: 08/08/2024 Discharge date: 08/09/2024  Admission Diagnoses:  Primary osteoarthritis of right hip [M16.11] S/P total right hip arthroplasty [Z96.641]   Discharge Diagnoses: Patient Active Problem List   Diagnosis Date Noted   S/P total right hip arthroplasty 08/08/2024   Pain of right sacroiliac joint 01/11/2024   Diabetes mellitus (HCC) 12/14/2023   Acute on chronic systolic congestive heart failure (HCC) 05/15/2023   Inflammation of sacroiliac joint (HCC) 02/24/2023   Right foot drop 02/24/2023   Spasm of back muscles 02/24/2023   Spondylosis without myelopathy 02/24/2023   Pain in joint of left shoulder 03/31/2022   Stiffness of left shoulder joint 03/31/2022   Rotator cuff tear arthropathy 03/28/2022   Impingement syndrome of right shoulder region 10/07/2021   Localized, primary osteoarthritis of shoulder region 10/07/2021   Hyperlipidemia LDL goal <70 11/23/2020   NICM (nonischemic cardiomyopathy) (HCC)    Acute R MCA ischemic stroke (HCC) s/p revascularization R M1, embolic d/t known AF 11/20/2020   Middle cerebral artery embolism, right 11/20/2020   Strain of extensor or abductor muscles, fascia and tendons of unspecified thumb at forearm level, initial encounter 06/12/2020   Lymphedema 06/07/2020   Acute right-sided low back pain with right-sided sciatica 05/21/2020   Chronic systolic heart failure (HCC)    Pulmonary hypertension, unspecified (HCC)    Nutritional deficiency 01/02/2020   Memory deficit 01/02/2020   Encounter for therapeutic drug level monitoring 01/02/2020   B12 deficiency 01/02/2020   PAD (peripheral artery disease) (HCC) 12/05/2019   Intractable vomiting 10/09/2019   Intestinal ischemia (HCC)    COPD (chronic obstructive pulmonary disease) (HCC)    Atrial fibrillation with rapid ventricular response (HCC) 09/11/2019   Enteritis     Peripheral edema 07/17/2019   Atherosclerosis of both carotid arteries 07/17/2019   Varicose veins of right lower extremity with inflammation 06/08/2019   Gastroesophageal reflux disease with esophagitis 05/08/2019   Fall at home, initial encounter 03/16/2019   Contusion of right lower leg 03/16/2019   Flu-like symptoms 02/03/2019   Nausea 02/03/2019   Suprapatellar bursitis of right knee 01/23/2019   Pain in right leg 01/23/2019   Cellulitis of right leg 01/23/2019   Chronic venous stasis dermatitis of right lower extremity 01/23/2019   Oropharyngeal candidiasis 01/23/2019   Cellulitis of head except face 12/23/2018   Screening for breast cancer 08/15/2018   Chronic bronchitis with acute exacerbation (HCC) 08/15/2018   Vitamin D  deficiency 08/15/2018   Need for vaccination against Streptococcus pneumoniae using pneumococcal conjugate vaccine 13 08/15/2018   Obstructive chronic bronchitis without exacerbation (HCC) 07/14/2018   Morbid obesity (HCC) 03/17/2018   Asthma 02/25/2018   Generalized anxiety disorder 12/16/2017   Essential hypertension 12/16/2017   Chronic respiratory failure with hypoxia (HCC) 12/16/2017   Long term (current) use of anticoagulants 12/16/2017   Chronic atrial fibrillation (HCC) 12/16/2017   Other specified functional intestinal disorders 12/16/2017   Gastro-esophageal reflux disease without esophagitis 12/16/2017   OSA on CPAP 12/16/2017   Allergic rhinitis due to animal (cat) (dog) hair and dander 12/16/2017   Allergic rhinitis due to pollen 12/16/2017   Severe persistent asthma without complication 12/16/2017   Cough 12/16/2017   Shortness of breath 12/16/2017   Superficial thrombophlebitis of right leg 11/17/2016   Pain in limb 11/17/2016   Chronic venous insufficiency 11/17/2016   Swelling of limb 11/17/2016   Cellulitis of buttock    Cellulitis of  right axilla    Sepsis (HCC) 06/28/2016   Cellulitis and abscess 06/28/2016   Hypokalemia  06/28/2016   Acute renal insufficiency 06/28/2016   Hyperglycemia 06/28/2016   Leukocytosis 06/28/2016   Hypoxia 06/28/2016   Collagenous colitis 05/13/2016    Past Medical History:  Diagnosis Date   Acute ischemic left MCA stroke (HCC) 11/20/2020   a.) brain MRI 11/20/2020 - acute M1 MCA occlusion --> embolectomy; (+) residual LEFT  sided weakness   Anxiety    a.) on BZO PRN (alprazolam )   Aortic atherosclerosis (HCC)    Aortic root dilatation (HCC)    Asthma    Bilateral carotid artery disease (HCC)    Cardiomegaly    Chronic atrial fibrillation (HCC) 09/2016   a.) Dx'd 09/2016; b.) failed flecainide and propafenone due to LE swelling and SOB, could not afford Multaq; c.) CHA2DS2VASc = 8 (age, sex, HFrEF, HTN, CVA x 2, vascular disease, T2DM) as of 08/04/2024; d.) rate/rhythm maintained on oral metoprolol  succinate; chronically anticoagulated with apixaban    Compression fracture of L1 vertebra (chronic)    COPD (chronic obstructive pulmonary disease) (HCC)    DDD (degenerative disc disease), thoracic    Dependence on supplemental oxygen     a.) 2 L/ PRN; 3L bled in with CPAP at bedtime   DM (diabetes mellitus), type 2 (HCC)    Family history of adverse reaction to anesthesia    a.) PONV in 2nd degree female relative (granddaughter)   GERD (gastroesophageal reflux disease)    HFmrEF (heart failure with mid-range ejection fraction) (HCC)    a. 12/2019 Echo: EF 40-45%. b. 01/2022 Echo: EF 40-45%.   History of 2019 novel coronavirus disease (COVID-19) 10/10/2019   History of methicillin resistant staphylococcus aureus (MRSA)    Hyperlipidemia    Hypertension    Long-term corticosteroid use (predinsone x 10+ years)    NICM (nonischemic cardiomyopathy) (HCC)    a. 12/2019 Echo: EF 40-45%, glob HK, mildly reduced RV fxn, sev dil LA. *HR 130 (afib) during study.   Nonobstructive CAD (coronary artery disease)    a. Lexiscan Myoview 10/2016: no evidence of ischemia, EF 53%; b. 02/2020  Cath: LM nl, LAD 20p, 7m, LCX 20ost, OM1/2/3 nl, LPDA nl, LPL1/2 nl, LPAV nl, RCA small, nl.   Nose colonized with MRSA 11/20/2020   a.) presurgical PCR (+) 11/20/2020 prior to IR CEREBRAL EMBOLECTOMY   Obesity    On apixaban  therapy    OSA on CPAP    a.) requires 3L O2 bled in at bedtime   Osteoarthritis    Osteoporosis    Pulmonary hypertension (HCC)    Rotator cuff arthropathy of both shoulders    Spinal stenosis of lumbar region 02/03/2024   Status post bilateral cataract extraction      Transfusion: none   Consultants (if any):   Discharged Condition: Improved  Hospital Course: Laura [name] is an 73 y.o. female who was admitted 08/08/2024 with a diagnosis of S/P total right hip arthroplasty and went to the operating room on 08/08/2024 and underwent the above named procedures.    Surgeries: Procedure(s): ARTHROPLASTY, HIP, TOTAL,POSTERIOR APPROACH on 08/08/2024 Patient tolerated the surgery well. Taken to PACU where she was stabilized and then transferred to the orthopedic floor.  Started on Eliquis . TEDs and SCDs applied bilaterally. Heels elevated on bed. No evidence of DVT. Negative Homan. Physical therapy started on day #1 for gait training and transfer. OT started day #1 for ADL and assisted devices.  Patient's IV was d/c on  day #1. Patient was able to safely and independently complete all PT goals. PT recommending discharge to home.    On post op day #1 patient was stable and ready for discharge to home with HHPT.  Implants: Cup: Trident Tritanium Clusterhole 50D w/ x2 screws    Liner: MDM 38/D  Stem: Accolade C 127 deg #4  with distal spacer Head:LFIT 22.2 +74mm inner ball, MDM X3 38D/22.2    She was given perioperative antibiotics:  Anti-infectives (From admission, onward)    Start     Dose/Rate Route Frequency Ordered Stop   08/08/24 1600  ceFAZolin  (ANCEF ) IVPB 2g/100 mL premix        2 g 200 mL/hr over 30 Minutes Intravenous Every 6 hours 08/08/24 1353  08/08/24 2307   08/08/24 0600  ceFAZolin  (ANCEF ) IVPB 2g/100 mL premix        2 g 200 mL/hr over 30 Minutes Intravenous On call to O.R. 08/08/24 0220 08/08/24 1006     .  She was given sequential compression devices, early ambulation, and Eliquis  for DVT prophylaxis.  She benefited maximally from the hospital stay and there were no complications.    Recent vital signs:  Vitals:   08/08/24 2230 08/09/24 0615  BP: 122/79 117/73  Pulse: 88 90  Resp: 18 18  Temp: 97.6 F (36.4 C)   SpO2: 95% 94%    Recent laboratory studies:  Lab Results  Component Value Date   HGB 12.0 08/09/2024   HGB 13.9 08/08/2024   HGB 14.6 08/01/2024   Lab Results  Component Value Date   WBC 6.9 08/09/2024   PLT 215 08/09/2024   Lab Results  Component Value Date   INR 1.2 11/20/2020   Lab Results  Component Value Date   NA 141 08/09/2024   K 3.4 (L) 08/09/2024   CL 109 08/09/2024   CO2 24 08/09/2024   BUN 12 08/09/2024   CREATININE 0.64 08/09/2024   GLUCOSE 131 (H) 08/09/2024    Discharge Medications:   Allergies as of 08/09/2024       Reactions   Flecainide Shortness Of Breath   Propafenone Shortness Of Breath, Swelling, Other (See Comments)   My limbs swell   Rivaroxaban Swelling, Other (See Comments)   My limbs swell, myalgias   Tirzepatide  Diarrhea, Nausea And Vomiting   Severe adverse side effects of mounjaro         Medication List     TAKE these medications    Accu-Chek Guide test strip Generic drug: glucose blood Use as instructed to check blood sugars daily 2 hours after meal DX E11.65   Accu-Chek Softclix Lancets lancets Accu-Chek Softclix Lancets  USE AS INSTRUCTED TO CHECK BLOOD SUGARS DAILY 2 HOURS AFTER MEAL   acetaminophen  500 MG tablet Commonly known as: TYLENOL  Take 2 tablets (1,000 mg total) by mouth every 8 (eight) hours.   alendronate 70 MG tablet Commonly known as: FOSAMAX Take 70 mg by mouth once a week. Take with a full glass of water on  an empty stomach.  Wednesdays   ALPRAZolam  0.25 MG tablet Commonly known as: XANAX  TAKE 1 TABLET BY MOUTH TWICE A DAY AS NEEDED FOR ANXIETY What changed:  how much to take how to take this when to take this reasons to take this   celecoxib  100 MG capsule Commonly known as: CeleBREX  Take 1 capsule (100 mg total) by mouth 2 (two) times daily for 7 days.   ipratropium-albuterol  0.5-2.5 (3) MG/3ML Soln Commonly known as: DUONEB  Take 3 mLs by nebulization every 6 (six) hours as needed.   Combivent Respimat 20-100 MCG/ACT Aers respimat Generic drug: Ipratropium-Albuterol  SMARTSIG:1 Puff(s) Via Inhaler 4 Times Daily PRN   docusate sodium  100 MG capsule Commonly known as: COLACE Take 1 capsule (100 mg total) by mouth 2 (two) times daily.   Eliquis  5 MG Tabs tablet Generic drug: apixaban  TAKE 1 TABLET BY MOUTH TWICE A DAY   EPINEPHrine  0.3 mg/0.3 mL Soaj injection Commonly known as: EPI-PEN Inject 0.3 mg into the muscle as needed for anaphylaxis.   famotidine  40 MG tablet Commonly known as: PEPCID  Take 1 tablet (40 mg total) by mouth at bedtime.   fluticasone  50 MCG/ACT nasal spray Commonly known as: FLONASE Place 2 sprays into the nose every morning.   furosemide  20 MG tablet Commonly known as: LASIX  Take 1 tablet (20 mg total) by mouth daily. What changed: when to take this   Jardiance  10 MG Tabs tablet Generic drug: empagliflozin  TAKE 1 TABLET BY MOUTH DAILY BEFORE BREAKFAST.   loperamide 2 MG capsule Commonly known as: IMODIUM Take 4 mg by mouth as needed for diarrhea or loose stools.   metoprolol  succinate 25 MG 24 hr tablet Commonly known as: TOPROL -XL Take 1 tablet (25 mg total) by mouth in the morning and at bedtime. What changed: how much to take   montelukast  10 MG tablet Commonly known as: SINGULAIR  TAKE 1 TABLET BY MOUTH EVERY DAY What changed: when to take this   ondansetron  4 MG tablet Commonly known as: ZOFRAN  Take 1 tablet (4 mg total) by  mouth every 6 (six) hours as needed for nausea.   oxyCODONE  5 MG immediate release tablet Commonly known as: Roxicodone  Take 0.5 tablets (2.5 mg total) by mouth every 8 (eight) hours as needed for breakthrough pain.   OXYGEN  Inhale 2.5-3 L into the lungs See admin instructions. 2.5 L [place] prn throughout the day and 3 L with CPAP   Ozempic  (1 MG/DOSE) 4 MG/3ML Sopn Generic drug: Semaglutide  (1 MG/DOSE) Inject 1 mg into the skin once a week. Tuesdays   potassium chloride  10 MEQ tablet Commonly known as: KLOR-CON  M Take 2 tablets (20 mEq) today, then 1 tablet (10 mEq) daily until surgery. Be sure to take dose on day of surgery. Follow up with PCP for repeat labs.   predniSONE  10 MG tablet Commonly known as: DELTASONE  Take 10 mg by mouth daily with breakfast.   rosuvastatin  20 MG tablet Commonly known as: CRESTOR  TAKE 1 TABLET BY MOUTH EVERY DAY What changed: when to take this   spironolactone  25 MG tablet Commonly known as: ALDACTONE  TAKE 1/2 TABLET BY MOUTH EVERY DAY What changed:  how much to take when to take this   tizanidine 2 MG capsule Commonly known as: ZANAFLEX Take 2 mg by mouth every 8 (eight) hours as needed for muscle spasms.   traMADol  50 MG tablet Commonly known as: ULTRAM  Take 1 tablet (50 mg total) by mouth every 6 (six) hours as needed for moderate pain (pain score 4-6).   Trelegy Ellipta 200-62.5-25 MCG/ACT Aepb Generic drug: Fluticasone -Umeclidin-Vilant Inhale 1 puff into the lungs every morning.               Durable Medical Equipment  (From admission, onward)           Start     Ordered   08/09/24 0818  For home use only DME Walker  Once       Question:  Patient needs a  walker to treat with the following condition  Answer:  H/O total hip arthroplasty, right   08/09/24 0818   08/09/24 0818  For home use only DME 3 n 1  Once        08/09/24 0818            Diagnostic Studies: DG Pelvis Portable Result Date: 08/08/2024 CLINICAL  DATA:  Right hip arthroplasty. EXAM: PORTABLE PELVIS 1-2 VIEWS COMPARISON:  None Available. FINDINGS: Right hip arthroplasty in expected alignment. No periprosthetic lucency or fracture. Recent postsurgical change includes air and edema in the soft tissues. IMPRESSION: Right hip arthroplasty without immediate postoperative complication. Electronically Signed   By: Andrea Gasman M.D.   On: 08/08/2024 13:12    Disposition: Discharge disposition: 06-Home-Health Care Svc          Follow-up Information     Laura Debby BROCKS, PA-C Follow up in 2 week(s).   Specialties: Orthopedic Surgery, Emergency Medicine Contact information: [address] [place] [address] [phone]                  Signed: Debby [name] Laura 08/09/2024, 8:18 AM

## 2024-08-08 NOTE — H&P (Signed)
 History of Present Illness: Laura [name] is an 73 y.o. female presents for history and physical for right posterior cemented total hip arthroplasty with Dr. Lorelle on 08/08/2024. Patient has advanced right hip osteoarthritis. She has had years of increasing pain. Pain is located in her groin and lateral hip and becomes more constant and severe with standing and ambulation. She has had a recent bone density scan showing osteoporosis. Pain is 10 out of 10. Pain interferes with her quality of life and activities a living. She is seen Dr. Lorelle discussed her history, x-rays and has agreed and consented to a right cemented posterior total hip arthroplasty. She has a history of a stroke 4 years ago with residual left upper and lower extremity weakness and reports that the left years she has been able to compensate utilizing her right hip for ambulation but now that her right hip hurts when she tries to walk she is no longer able to trust it and has severely limited her continued ability to ambulate and be independent.   She does report a history of COPD and CHF both of which per her report are well-managed. She utilizes oxygen  condenser as needed throughout the day and CPAP with 3 L oxygen  at night. She denies any increased usage. Denies any fevers, chills, chest pain or other systemic symptoms, no recent trauma.  Past Medical History: Past Medical History:  Diagnosis Date  Arrhythmia  Asthma in adult (HHS-HCC)  COPD (chronic obstructive pulmonary disease) (CMS/HHS-HCC)  Diabetes mellitus (CMS/HHS-HCC) 12/14/2023  GERD (gastroesophageal reflux disease)  Hyperlipidemia LDL goal <70 11/23/2020  Hypertension  Pulmonary hypertension, unspecified (CMS/HHS-HCC) 05/04/2024  Sleep apnea   Past Surgical History: Past Surgical History:  Procedure Laterality Date  drainage tubes 06/2016  3 tubes two under left arm, one one left buttocks, due to inection and sepis setting in  LAPAROSCOPIC BOWEL RESECTION  09/13/2019  Small and large-- Dr. Lucas Catchings  CATARACT EXTRACTION July 05 2019 and july21 2020  COLON SURGERY  ENDOSCOPIC CARPAL TUNNEL RELEASE  STAB PHLEBECTOMY VARICOSE VEINS ONE EXTREMITY  VASCULAR SURGERY   Past Family History: Family History  Problem Relation Age of Onset  Dementia Mother  Osteoporosis (Thinning of bones) Mother  Heart disease Mother  Alzheimer's disease Mother  Lung disease Father  Asthma Father  Dementia Sister  Osteoporosis (Thinning of bones) Sister  Heart disease Sister  Alzheimer's disease Sister  Diabetes Brother  High blood pressure (Hypertension) Brother  Allergies Brother   Medications: Current Outpatient Medications  Medication Sig Dispense Refill  albuterol  MDI, PROVENTIL , VENTOLIN , PROAIR , HFA 90 mcg/actuation inhaler Inhale 2 Inhalations into the lungs every 4 (four) hours as needed  alendronate (FOSAMAX) 70 MG tablet Take 70 mg by mouth every 7 (seven) days Take on an empty stomach with a full glass of water. Avoid mineral or well water. Do not eat or take other medications for at least 30 minutes after dose. Sit or stand for at least 30 minutes after dose.  ALPRAZolam  (XANAX ) 0.25 MG tablet TAKE 1 TABLET (0.25 MG TOTAL) BY MOUTH 2 (TWO) TIMES DAILY. 3  apixaban  (ELIQUIS ) 5 mg tablet Take by mouth 2 (two) times daily  famotidine  (PEPCID ) 40 MG tablet Take 40 mg by mouth at bedtime  fluticasone  propionate (FLONASE) 50 mcg/actuation nasal spray Place 1 spray into both nostrils once daily  FUROsemide  (LASIX ) 20 MG tablet Take 1 tablet (20 mg total) by mouth once daily 90 tablet 3  ipratropium-albuteroL  (COMBIVENT RESPIMAT) 20-100 mcg/actuation inhaler Inhale 1  Puff into the lungs 4 (four) times daily as needed for Wheezing 12 g 3  JARDIANCE  10 mg tablet Take 10 mg by mouth every morning before breakfast  L. acidophilus/Bifid. animalis 32 billion cell Cap Take 1 capsule by mouth once daily  loperamide (IMODIUM) 2 mg capsule Take 4 mg by  mouth once daily as needed for Diarrhea  losartan  (COZAAR ) 25 MG tablet Take by mouth  methocarbamoL (ROBAXIN) 500 MG tablet Take 1 tablet by mouth 3 (three) times daily as needed  metoprolol  SUCCinate (TOPROL -XL) 25 MG XL tablet Take 25 mg by mouth  montelukast  (SINGULAIR ) 10 mg tablet take 1 tablet by mouth once daily 90 tablet 1  omeprazole  (PRILOSEC) 40 MG DR capsule Take 40 mg by mouth once daily  predniSONE  (DELTASONE ) 10 MG tablet Take 10 mg by mouth once daily  rosuvastatin  (CRESTOR ) 20 MG tablet Take 1 tablet by mouth once daily  SEMAGLUTIDE  SUBQ Inject 2.5 mg subcutaneously once a week  spironolactone  (ALDACTONE ) 25 MG tablet Take 25 mg by mouth 2 (two) times daily 3   No current facility-administered medications for this visit.   Allergies: Allergies  Allergen Reactions  Flecainide Shortness Of Breath  Metoprolol  Other (See Comments), Shortness Of Breath and Swelling  My limbs swell also  Propafenone Shortness Of Breath and Swelling  Propafenone Hcl Shortness Of Breath and Swelling  propafenone hydrochloride  Tirzepatide  Diarrhea and Nausea And Vomiting  Severe adverse side effects of mounjaro   Rivaroxaban Muscle Pain and Other (See Comments)    Visit Vitals: Vitals:  07/13/24 0938  BP: 122/80    Review of Systems:  A comprehensive 14 point ROS was performed, reviewed, and the pertinent orthopaedic findings are documented in the HPI.  Physical Exam: Body mass index is 38.79 kg/m. General:  Well developed, well nourished, no apparent distress, normal affect, presents in a wheelchair  HEENT: Head normocephalic, atraumatic, PERRL.   Abdomen: Soft, non tender, non distended, Bowel sounds present.  Heart: Examination of the heart reveals regular, rate, and rhythm. There is no murmur noted on ascultation. There is a normal apical pulse.  Lungs: Lungs are clear to auscultation. There is no wheeze, rhonchi, or crackles. There is normal expansion of bilateral  chest walls.   Right hip exam  SKIN: intact SWELLING: none WARMTH: no warmth TENDERNESS: none, Stinchfield Positive ROM: 0 degrees internal rotation and 30 degrees external rotation and pain with internal rotation,; Hip Flexion 85 STRENGTH: limited by pain GAIT: unable to walk STABILITY: stable to testing CREPITUS: no LEG LENGTH DISCREPANCY: none NEUROLOGICAL EXAM: normal VASCULAR EXAM: normal LUMBAR SPINE: tenderness: no straight leg raising sign: no motor exam: normal  The contralateral hip was examined for comparison and it showed: TENDERNESS: none ROM: normal and full STRENGTH: normal STABILITY: stable to testing  Hip Imaging :  I have reviewed AP pelvis and lateral hip X-rays (2 views) taken our office that show severe degenerative changes with femoral head collapse and joint space narrowing with sclerosis and osteophyte formation and cystic changes in the superior dome of the femoral head of the acetabulum. The left hip shows moderate degenerative changes in the central joint space narrowing sclerosis osteophyte formation and early subchondral cystic formation. No fractures or dislocations in either hip or the pelvis. .   Assessment:  Encounter Diagnosis  Name Primary?  Primary osteoarthritis of right hip Yes  Right hip osteoarthritis  Plan: Laura [name] is a 73 year old female with advanced right hip degenerative arthritis with complete loss of joint space.  Pain is interfering with her quality of life and activities of daily living. Relief with conservative treatment consisting of injections, mobility devices and activity modification. Risks, benefits, complications of a right posterior cemented total hip arthroplasty have been discussed with the patient. Patient has agreed and consented the procedure with Dr. Lorelle on 08/08/2024.  The hospitalization and post-operative care and rehabilitation were also discussed. The use of perioperative antibiotics and DVT prophylaxis were  discussed. The risk, benefits and alternatives to a surgical intervention were discussed at length with the patient. The patient was also advised of risks related to the medical comorbidities and elevated body mass index (BMI). A lengthy discussion took place to review the most common complications including but not limited to: deep vein thrombosis, pulmonary embolus, heart attack, stroke, infection, wound breakdown, heterotopic ossification, dislocation, numbness, leg length in-equality, intraoperative fracture, damage to nerves, tendon,muscles, arteries or other blood vessels, death and other possible complications from anesthesia. The patient was told that we will take steps to minimize these risks by using sterile technique, antibiotics and DVT prophylaxis when appropriate and follow the patient postoperatively in the office setting to monitor progress. The possibility of recurrent pain, no improvement in pain and actual worsening of pain were also discussed with the patient. The risk of dislocation following total hip replacement was discussed and potential precautions to prevent dislocation were reviewed.   We discussed her recovery and importance of walker use with her pre-existing left leg weakness.  The risks of cement use and her specific increased risk with her underlying medical conditions was reviewed. All questions answered. Patient agrees with plan for right posterior cemented total hip arthroplasty.

## 2024-08-08 NOTE — Plan of Care (Signed)
  Problem: Activity: Goal: Ability to avoid complications of mobility impairment will improve Outcome: Progressing Goal: Ability to tolerate increased activity will improve Outcome: Progressing   Problem: Clinical Measurements: Goal: Postoperative complications will be avoided or minimized Outcome: Progressing   Problem: Pain Management: Goal: Pain level will decrease with appropriate interventions Outcome: Progressing   

## 2024-08-08 NOTE — Transfer of Care (Signed)
 Immediate Anesthesia Transfer of Care Note  Patient: Laura [name]  Procedure(s) Performed: ARTHROPLASTY, HIP, TOTAL,POSTERIOR APPROACH (Right: Hip)  Patient Location: PACU  Anesthesia Type:MAC and Spinal  Level of Consciousness: awake  Airway & Oxygen  Therapy: Patient Spontanous Breathing and Patient connected to face mask oxygen   Post-op Assessment: Report given to RN and Post -op Vital signs reviewed and stable  Post vital signs: Reviewed and stable  Last Vitals:  Vitals Value Taken Time  BP 83/63 1145  Temp    Pulse 90   Resp 18   SpO2 99     Last Pain:  Vitals:   08/08/24 0801  TempSrc: Temporal  PainSc: 5          Complications: No notable events documented.

## 2024-08-09 DIAGNOSIS — I2510 Atherosclerotic heart disease of native coronary artery without angina pectoris: Secondary | ICD-10-CM | POA: Diagnosis not present

## 2024-08-09 DIAGNOSIS — I428 Other cardiomyopathies: Secondary | ICD-10-CM | POA: Diagnosis not present

## 2024-08-09 DIAGNOSIS — M1611 Unilateral primary osteoarthritis, right hip: Secondary | ICD-10-CM | POA: Diagnosis not present

## 2024-08-09 DIAGNOSIS — M810 Age-related osteoporosis without current pathological fracture: Secondary | ICD-10-CM | POA: Diagnosis not present

## 2024-08-09 DIAGNOSIS — I110 Hypertensive heart disease with heart failure: Secondary | ICD-10-CM | POA: Diagnosis not present

## 2024-08-09 DIAGNOSIS — I5022 Chronic systolic (congestive) heart failure: Secondary | ICD-10-CM | POA: Diagnosis not present

## 2024-08-09 LAB — CBC
HCT: 36 % (ref 36.0–46.0)
Hemoglobin: 12 g/dL (ref 12.0–15.0)
MCH: 30.7 pg (ref 26.0–34.0)
MCHC: 33.3 g/dL (ref 30.0–36.0)
MCV: 92.1 fL (ref 80.0–100.0)
Platelets: 215 K/uL (ref 150–400)
RBC: 3.91 MIL/uL (ref 3.87–5.11)
RDW: 14.6 % (ref 11.5–15.5)
WBC: 6.9 K/uL (ref 4.0–10.5)
nRBC: 0 % (ref 0.0–0.2)

## 2024-08-09 LAB — BASIC METABOLIC PANEL WITH GFR
Anion gap: 8 (ref 5–15)
BUN: 12 mg/dL (ref 8–23)
CO2: 24 mmol/L (ref 22–32)
Calcium: 8.5 mg/dL — ABNORMAL LOW (ref 8.9–10.3)
Chloride: 109 mmol/L (ref 98–111)
Creatinine, Ser: 0.64 mg/dL (ref 0.44–1.00)
GFR, Estimated: >60 mL/min (ref >60)
Glucose, Bld: 131 mg/dL — ABNORMAL HIGH (ref 70–99)
Potassium: 3.4 mmol/L — ABNORMAL LOW (ref 3.5–5.1)
Sodium: 141 mmol/L (ref 135–145)

## 2024-08-09 LAB — GLUCOSE, CAPILLARY
Glucose-Capillary: 93 mg/dL (ref 70–99)
Glucose-Capillary: 131 mg/dL — ABNORMAL HIGH (ref 70–99)

## 2024-08-09 NOTE — Anesthesia Postprocedure Evaluation (Signed)
 Anesthesia Post Note  Patient: Laura [name]  Procedure(s) Performed: ARTHROPLASTY, HIP, TOTAL,POSTERIOR APPROACH (Right: Hip)  Patient location during evaluation: Short Stay Anesthesia Type: Spinal Level of consciousness: awake and alert and oriented Pain management: satisfactory to patient Vital Signs Assessment: post-procedure vital signs reviewed and stable Respiratory status: spontaneous breathing Cardiovascular status: stable Postop Assessment: adequate PO intake and able to ambulate Anesthetic complications: no Comments: Has been able to void.   No notable events documented.   Last Vitals:  Vitals:   08/08/24 2230 08/09/24 0615  BP: 122/79 117/73  Pulse: 88 90  Resp: 18 18  Temp: 36.4 C   SpO2: 95% 94%    Last Pain:  Vitals:   08/09/24 0219  TempSrc:   PainSc: Asleep                 Roselee Hint

## 2024-08-09 NOTE — Progress Notes (Signed)
 Patient is not able to walk the distance required to go the bathroom, or she is unable to safely negotiate stairs required to access the bathroom.  A 3in1 BSC will alleviate this problem.       Lollie Marrow, PA-C [facility]

## 2024-08-09 NOTE — Progress Notes (Signed)
 Subjective: 1 Day Post-Op Procedure(s) (LRB): ARTHROPLASTY, HIP, TOTAL,POSTERIOR APPROACH (Right) Patient reports pain as mild.   Patient is well, and has had no acute complaints or problems Denies any CP, SOB, ABD pain. We will continue therapy today.  Plan is to go Home after hospital stay.  Objective: Vital signs in last 24 hours: Temp:  [97.3 F (36.3 C)-98.8 F (37.1 C)] 97.6 F (36.4 C) (08/18 2230) Pulse Rate:  [70-137] 90 (08/19 0615) Resp:  [14-28] 18 (08/19 0615) BP: (83-131)/(50-85) 117/73 (08/19 0615) SpO2:  [91 %-100 %] 94 % (08/19 0615) Weight:  [93.3 kg] 93.3 kg (08/18 1246)  Intake/Output from previous day: 08/18 0701 - 08/19 0700 In: 1362.5 [P.O.:120; I.V.:980; IV Piggyback:262.5] Out: 150 [Blood:150] Intake/Output this shift: No intake/output data recorded.  Recent Labs    08/08/24 0825 08/09/24 0527  HGB 13.9 12.0   Recent Labs    08/08/24 0825 08/09/24 0527  WBC  --  6.9  RBC  --  3.91  HCT 41.0 36.0  PLT  --  215   Recent Labs    08/08/24 0825 08/09/24 0527  NA 140 141  K 3.3* 3.4*  CL 102 109  CO2  --  24  BUN 10 12  CREATININE 0.70 0.64  GLUCOSE 132* 131*  CALCIUM   --  8.5*   No results for input(s): LABPT, INR in the last 72 hours.  EXAM General - Patient is Alert, Appropriate, and Oriented Extremity - Neurovascular intact Sensation intact distally Intact pulses distally Dorsiflexion/Plantar flexion intact No cellulitis present Compartment soft Dressing - dressing C/D/I and no drainage Motor Function - intact, moving foot and toes well on exam.   Past Medical History:  Diagnosis Date   Acute ischemic left MCA stroke (HCC) 11/20/2020   a.) brain MRI 11/20/2020 - acute M1 MCA occlusion --> embolectomy; (+) residual LEFT  sided weakness   Anxiety    a.) on BZO PRN (alprazolam )   Aortic atherosclerosis (HCC)    Aortic root dilatation (HCC)    Asthma    Bilateral carotid artery disease (HCC)    Cardiomegaly     Chronic atrial fibrillation (HCC) 09/2016   a.) Dx'd 09/2016; b.) failed flecainide and propafenone due to LE swelling and SOB, could not afford Multaq; c.) CHA2DS2VASc = 8 (age, sex, HFrEF, HTN, CVA x 2, vascular disease, T2DM) as of 08/04/2024; d.) rate/rhythm maintained on oral metoprolol  succinate; chronically anticoagulated with apixaban    Compression fracture of L1 vertebra (chronic)    COPD (chronic obstructive pulmonary disease) (HCC)    DDD (degenerative disc disease), thoracic    Dependence on supplemental oxygen     a.) 2 L/[place] PRN; 3L bled in with CPAP at bedtime   DM (diabetes mellitus), type 2 (HCC)    Family history of adverse reaction to anesthesia    a.) PONV in 2nd degree female relative (granddaughter)   GERD (gastroesophageal reflux disease)    HFmrEF (heart failure with mid-range ejection fraction) (HCC)    a. 12/2019 Echo: EF 40-45%. b. 01/2022 Echo: EF 40-45%.   History of 2019 novel coronavirus disease (COVID-19) 10/10/2019   History of methicillin resistant staphylococcus aureus (MRSA)    Hyperlipidemia    Hypertension    Long-term corticosteroid use (predinsone x 10+ years)    NICM (nonischemic cardiomyopathy) (HCC)    a. 12/2019 Echo: EF 40-45%, glob HK, mildly reduced RV fxn, sev dil LA. *HR 130 (afib) during study.   Nonobstructive CAD (coronary artery disease)  a. Lexiscan Myoview 10/2016: no evidence of ischemia, EF 53%; b. 02/2020 Cath: LM nl, LAD 20p, 55m, LCX 20ost, OM1/2/3 nl, LPDA nl, LPL1/2 nl, LPAV nl, RCA small, nl.   Nose colonized with MRSA 11/20/2020   a.) presurgical PCR (+) 11/20/2020 prior to IR CEREBRAL EMBOLECTOMY   Obesity    On apixaban  therapy    OSA on CPAP    a.) requires 3L O2 bled in at bedtime   Osteoarthritis    Osteoporosis    Pulmonary hypertension (HCC)    Rotator cuff arthropathy of both shoulders    Spinal stenosis of lumbar region 02/03/2024   Status post bilateral cataract extraction     Assessment/Plan:   1 Day Post-Op  Procedure(s) (LRB): ARTHROPLASTY, HIP, TOTAL,POSTERIOR APPROACH (Right) Principal Problem:   S/P total right hip arthroplasty  Estimated body mass index is 35.29 kg/m as calculated from the following:   Height as of this encounter: 5' 4 (1.626 m).   Weight as of this encounter: 93.3 kg. Advance diet Up with therapy Pain well controlled Labs and VSS CM to assist with discharge to home with HHPT today  DVT Prophylaxis - TED hose and SCDS Eliquis  Weight-Bearing as tolerated to right leg   T. Medford Amber, PA-C [facility] 08/09/2024, 8:15 AM

## 2024-08-09 NOTE — Progress Notes (Signed)
 Physical Therapy Treatment Patient Details Name: Laura [name] MRN: [id number] DOB: [birth year] Today's Date: 08/09/2024   History of Present Illness Pt is s/p R THA. She is POD 0 at time of evaluation. PMH includes: CVA (L sided weakness), COPD, DM, GERD, HLD, HTN, and pulmonary HTN.    PT Comments  Pt was supine in bed upon arrival. She endorses rough day previous date with pain however feeling better today. 3/10 pain reported. Pt tolerated getting OOB , standing, and ambulating ~ 80 ft. Pt has not been ambulatory prior to surgery since January. Pt has ramp entry to home. Author did issue HEP handout with instructions however will be returning once caregivers arrive to discussed post acute care needs.    If plan is discharge home, recommend the following: A little help with walking and/or transfers;A little help with bathing/dressing/bathroom;Assistance with cooking/housework;Assist for transportation;Help with stairs or ramp for entrance     Equipment Recommendations  Rolling walker (2 wheels)       Precautions / Restrictions Precautions Precautions: Posterior Hip Precaution Booklet Issued: Yes (comment) Recall of Precautions/Restrictions: Intact Restrictions Weight Bearing Restrictions Per Provider Order: Yes RLE Weight Bearing Per Provider Order: Weight bearing as tolerated     Mobility  Bed Mobility Overal bed mobility: Needs Assistance Bed Mobility: Sidelying to Sit, Rolling, Supine to Sit Sidelying to sit: Contact guard assist, Used rails  General bed mobility comments: CGA for safety and for sequencing/Tcing . pt used bedrails    Transfers Overall transfer level: Needs assistance Equipment used: Rolling walker (2 wheels) Transfers: Sit to/from Stand Sit to Stand: Contact guard assist  General transfer comment: CGA for safety    Ambulation/Gait Ambulation/Gait assistance: Supervision Gait Distance (Feet): 80 Feet Assistive device: Rolling walker (2 wheels) Gait  Pattern/deviations: Step-through pattern Gait velocity: decreased  General Gait Details: pt has been non-ambulatory since january. Author will return when cregivers arrive.   Stairs Stairs:  (pt had a ramp entry)         Balance Overall balance assessment: Modified Independent       Communication Communication Communication: No apparent difficulties  Cognition Arousal: Alert Behavior During Therapy: WFL for tasks assessed/performed   PT - Cognitive impairments: No apparent impairments      PT - Cognition Comments: Pt very pleasant and agreeable to PT. Following commands: Intact      Cueing Cueing Techniques: Verbal cues, Gestural cues, Visual cues     General Comments General comments (skin integrity, edema, etc.): Issued HEP handout with instructions      Pertinent Vitals/Pain Pain Assessment Pain Assessment: 0-10 Pain Score: 2      PT Goals (current goals can now be found in the care plan section) Acute Rehab PT Goals Patient Stated Goal: walk again. ive been in w/c since january Progress towards PT goals: Progressing toward goals    Frequency    BID       AM-PAC PT 6 Clicks Mobility   Outcome Measure  Help needed turning from your back to your side while in a flat bed without using bedrails?: A Little Help needed moving from lying on your back to sitting on the side of a flat bed without using bedrails?: A Little Help needed moving to and from a bed to a chair (including a wheelchair)?: A Little Help needed standing up from a chair using your arms (e.g., wheelchair or bedside chair)?: A Little Help needed to walk in hospital room?: A Little Help needed climbing 3-5 steps with  a railing? : A Little 6 Click Score: 18    End of Session   Activity Tolerance: Patient tolerated treatment well Patient left: in chair;with call bell/phone within reach;with family/visitor present Nurse Communication: Mobility status PT Visit Diagnosis: Muscle weakness  (generalized) (M62.81);Difficulty in walking, not elsewhere classified (R26.2);Unsteadiness on feet (R26.81);History of falling (Z91.81)     Time: 9249-9187 PT Time Calculation (min) (ACUTE ONLY): 22 min  Charges:    $Gait Training: 8-22 mins PT General Charges $$ ACUTE PT VISIT: 1 Visit                    Rankin Essex PTA 08/09/24, 9:10 AM

## 2024-08-09 NOTE — Plan of Care (Signed)
   Problem: Activity: Goal: Ability to avoid complications of mobility impairment will improve Outcome: Progressing   Problem: Pain Management: Goal: Pain level will decrease with appropriate interventions Outcome: Progressing

## 2024-08-09 NOTE — Progress Notes (Addendum)
 DISCHARGE NOTE:  Pt given discharge instructions and verbalized understanding. Walker and CPAP sent with pt. Pt wheeled to car by staff, daughter providing transportation home.

## 2024-08-10 ENCOUNTER — Encounter: Payer: Self-pay | Admitting: Orthopedic Surgery

## 2024-08-10 DIAGNOSIS — Z96641 Presence of right artificial hip joint: Secondary | ICD-10-CM | POA: Diagnosis not present

## 2024-08-10 DIAGNOSIS — Z8673 Personal history of transient ischemic attack (TIA), and cerebral infarction without residual deficits: Secondary | ICD-10-CM | POA: Diagnosis not present

## 2024-08-10 DIAGNOSIS — F411 Generalized anxiety disorder: Secondary | ICD-10-CM | POA: Diagnosis not present

## 2024-08-10 DIAGNOSIS — I4891 Unspecified atrial fibrillation: Secondary | ICD-10-CM | POA: Diagnosis not present

## 2024-08-10 DIAGNOSIS — K219 Gastro-esophageal reflux disease without esophagitis: Secondary | ICD-10-CM | POA: Diagnosis not present

## 2024-08-10 DIAGNOSIS — I110 Hypertensive heart disease with heart failure: Secondary | ICD-10-CM | POA: Diagnosis not present

## 2024-08-10 DIAGNOSIS — E1151 Type 2 diabetes mellitus with diabetic peripheral angiopathy without gangrene: Secondary | ICD-10-CM | POA: Diagnosis not present

## 2024-08-10 DIAGNOSIS — Z471 Aftercare following joint replacement surgery: Secondary | ICD-10-CM | POA: Diagnosis not present

## 2024-08-10 DIAGNOSIS — Z7951 Long term (current) use of inhaled steroids: Secondary | ICD-10-CM | POA: Diagnosis not present

## 2024-08-10 DIAGNOSIS — I872 Venous insufficiency (chronic) (peripheral): Secondary | ICD-10-CM | POA: Diagnosis not present

## 2024-08-10 DIAGNOSIS — E785 Hyperlipidemia, unspecified: Secondary | ICD-10-CM | POA: Diagnosis not present

## 2024-08-10 DIAGNOSIS — I890 Lymphedema, not elsewhere classified: Secondary | ICD-10-CM | POA: Diagnosis not present

## 2024-08-10 DIAGNOSIS — E538 Deficiency of other specified B group vitamins: Secondary | ICD-10-CM | POA: Diagnosis not present

## 2024-08-10 DIAGNOSIS — Z7901 Long term (current) use of anticoagulants: Secondary | ICD-10-CM | POA: Diagnosis not present

## 2024-08-10 DIAGNOSIS — I272 Pulmonary hypertension, unspecified: Secondary | ICD-10-CM | POA: Diagnosis not present

## 2024-08-10 DIAGNOSIS — Z79899 Other long term (current) drug therapy: Secondary | ICD-10-CM | POA: Diagnosis not present

## 2024-08-10 DIAGNOSIS — M21371 Foot drop, right foot: Secondary | ICD-10-CM | POA: Diagnosis not present

## 2024-08-10 DIAGNOSIS — J9611 Chronic respiratory failure with hypoxia: Secondary | ICD-10-CM | POA: Diagnosis not present

## 2024-08-10 DIAGNOSIS — I5023 Acute on chronic systolic (congestive) heart failure: Secondary | ICD-10-CM | POA: Diagnosis not present

## 2024-08-10 DIAGNOSIS — M810 Age-related osteoporosis without current pathological fracture: Secondary | ICD-10-CM | POA: Diagnosis not present

## 2024-08-10 DIAGNOSIS — J4489 Other specified chronic obstructive pulmonary disease: Secondary | ICD-10-CM | POA: Diagnosis not present

## 2024-08-10 DIAGNOSIS — M47819 Spondylosis without myelopathy or radiculopathy, site unspecified: Secondary | ICD-10-CM | POA: Diagnosis not present

## 2024-08-10 DIAGNOSIS — I428 Other cardiomyopathies: Secondary | ICD-10-CM | POA: Diagnosis not present

## 2024-08-10 DIAGNOSIS — E559 Vitamin D deficiency, unspecified: Secondary | ICD-10-CM | POA: Diagnosis not present

## 2024-08-12 DIAGNOSIS — I4891 Unspecified atrial fibrillation: Secondary | ICD-10-CM | POA: Diagnosis not present

## 2024-08-12 DIAGNOSIS — Z471 Aftercare following joint replacement surgery: Secondary | ICD-10-CM | POA: Diagnosis not present

## 2024-08-12 DIAGNOSIS — E1151 Type 2 diabetes mellitus with diabetic peripheral angiopathy without gangrene: Secondary | ICD-10-CM | POA: Diagnosis not present

## 2024-08-12 DIAGNOSIS — I5023 Acute on chronic systolic (congestive) heart failure: Secondary | ICD-10-CM | POA: Diagnosis not present

## 2024-08-12 DIAGNOSIS — I110 Hypertensive heart disease with heart failure: Secondary | ICD-10-CM | POA: Diagnosis not present

## 2024-08-12 DIAGNOSIS — J9611 Chronic respiratory failure with hypoxia: Secondary | ICD-10-CM | POA: Diagnosis not present

## 2024-08-15 ENCOUNTER — Ambulatory Visit: Payer: Medicare Other | Admitting: Nurse Practitioner

## 2024-08-15 ENCOUNTER — Other Ambulatory Visit: Payer: Self-pay | Admitting: Nurse Practitioner

## 2024-08-15 DIAGNOSIS — Z471 Aftercare following joint replacement surgery: Secondary | ICD-10-CM | POA: Diagnosis not present

## 2024-08-15 DIAGNOSIS — I5023 Acute on chronic systolic (congestive) heart failure: Secondary | ICD-10-CM | POA: Diagnosis not present

## 2024-08-15 DIAGNOSIS — J9611 Chronic respiratory failure with hypoxia: Secondary | ICD-10-CM | POA: Diagnosis not present

## 2024-08-15 DIAGNOSIS — I4891 Unspecified atrial fibrillation: Secondary | ICD-10-CM | POA: Diagnosis not present

## 2024-08-15 DIAGNOSIS — I110 Hypertensive heart disease with heart failure: Secondary | ICD-10-CM | POA: Diagnosis not present

## 2024-08-15 DIAGNOSIS — E1151 Type 2 diabetes mellitus with diabetic peripheral angiopathy without gangrene: Secondary | ICD-10-CM | POA: Diagnosis not present

## 2024-08-19 DIAGNOSIS — I5023 Acute on chronic systolic (congestive) heart failure: Secondary | ICD-10-CM | POA: Diagnosis not present

## 2024-08-19 DIAGNOSIS — J9611 Chronic respiratory failure with hypoxia: Secondary | ICD-10-CM | POA: Diagnosis not present

## 2024-08-19 DIAGNOSIS — I4891 Unspecified atrial fibrillation: Secondary | ICD-10-CM | POA: Diagnosis not present

## 2024-08-19 DIAGNOSIS — Z471 Aftercare following joint replacement surgery: Secondary | ICD-10-CM | POA: Diagnosis not present

## 2024-08-19 DIAGNOSIS — E1151 Type 2 diabetes mellitus with diabetic peripheral angiopathy without gangrene: Secondary | ICD-10-CM | POA: Diagnosis not present

## 2024-08-19 DIAGNOSIS — I110 Hypertensive heart disease with heart failure: Secondary | ICD-10-CM | POA: Diagnosis not present

## 2024-08-24 DIAGNOSIS — I110 Hypertensive heart disease with heart failure: Secondary | ICD-10-CM | POA: Diagnosis not present

## 2024-08-24 DIAGNOSIS — E1151 Type 2 diabetes mellitus with diabetic peripheral angiopathy without gangrene: Secondary | ICD-10-CM | POA: Diagnosis not present

## 2024-08-24 DIAGNOSIS — Z471 Aftercare following joint replacement surgery: Secondary | ICD-10-CM | POA: Diagnosis not present

## 2024-08-24 DIAGNOSIS — I4891 Unspecified atrial fibrillation: Secondary | ICD-10-CM | POA: Diagnosis not present

## 2024-08-24 DIAGNOSIS — I5023 Acute on chronic systolic (congestive) heart failure: Secondary | ICD-10-CM | POA: Diagnosis not present

## 2024-08-24 DIAGNOSIS — J9611 Chronic respiratory failure with hypoxia: Secondary | ICD-10-CM | POA: Diagnosis not present

## 2024-08-26 DIAGNOSIS — Z471 Aftercare following joint replacement surgery: Secondary | ICD-10-CM | POA: Diagnosis not present

## 2024-08-26 DIAGNOSIS — J9611 Chronic respiratory failure with hypoxia: Secondary | ICD-10-CM | POA: Diagnosis not present

## 2024-08-26 DIAGNOSIS — I5023 Acute on chronic systolic (congestive) heart failure: Secondary | ICD-10-CM | POA: Diagnosis not present

## 2024-08-26 DIAGNOSIS — I110 Hypertensive heart disease with heart failure: Secondary | ICD-10-CM | POA: Diagnosis not present

## 2024-08-26 DIAGNOSIS — I4891 Unspecified atrial fibrillation: Secondary | ICD-10-CM | POA: Diagnosis not present

## 2024-08-26 DIAGNOSIS — E1151 Type 2 diabetes mellitus with diabetic peripheral angiopathy without gangrene: Secondary | ICD-10-CM | POA: Diagnosis not present

## 2024-08-27 ENCOUNTER — Other Ambulatory Visit: Payer: Self-pay | Admitting: Nurse Practitioner

## 2024-08-27 DIAGNOSIS — Z760 Encounter for issue of repeat prescription: Secondary | ICD-10-CM

## 2024-08-30 DIAGNOSIS — Z471 Aftercare following joint replacement surgery: Secondary | ICD-10-CM | POA: Diagnosis not present

## 2024-08-30 DIAGNOSIS — J9611 Chronic respiratory failure with hypoxia: Secondary | ICD-10-CM | POA: Diagnosis not present

## 2024-08-30 DIAGNOSIS — I110 Hypertensive heart disease with heart failure: Secondary | ICD-10-CM | POA: Diagnosis not present

## 2024-08-30 DIAGNOSIS — I4891 Unspecified atrial fibrillation: Secondary | ICD-10-CM | POA: Diagnosis not present

## 2024-08-30 DIAGNOSIS — E1151 Type 2 diabetes mellitus with diabetic peripheral angiopathy without gangrene: Secondary | ICD-10-CM | POA: Diagnosis not present

## 2024-08-30 DIAGNOSIS — I5023 Acute on chronic systolic (congestive) heart failure: Secondary | ICD-10-CM | POA: Diagnosis not present

## 2024-09-01 DIAGNOSIS — Z471 Aftercare following joint replacement surgery: Secondary | ICD-10-CM | POA: Diagnosis not present

## 2024-09-01 DIAGNOSIS — J9611 Chronic respiratory failure with hypoxia: Secondary | ICD-10-CM | POA: Diagnosis not present

## 2024-09-01 DIAGNOSIS — I110 Hypertensive heart disease with heart failure: Secondary | ICD-10-CM | POA: Diagnosis not present

## 2024-09-01 DIAGNOSIS — I5023 Acute on chronic systolic (congestive) heart failure: Secondary | ICD-10-CM | POA: Diagnosis not present

## 2024-09-01 DIAGNOSIS — E1151 Type 2 diabetes mellitus with diabetic peripheral angiopathy without gangrene: Secondary | ICD-10-CM | POA: Diagnosis not present

## 2024-09-01 DIAGNOSIS — I4891 Unspecified atrial fibrillation: Secondary | ICD-10-CM | POA: Diagnosis not present

## 2024-09-09 DIAGNOSIS — Z8673 Personal history of transient ischemic attack (TIA), and cerebral infarction without residual deficits: Secondary | ICD-10-CM | POA: Diagnosis not present

## 2024-09-09 DIAGNOSIS — E1151 Type 2 diabetes mellitus with diabetic peripheral angiopathy without gangrene: Secondary | ICD-10-CM | POA: Diagnosis not present

## 2024-09-09 DIAGNOSIS — Z7901 Long term (current) use of anticoagulants: Secondary | ICD-10-CM | POA: Diagnosis not present

## 2024-09-09 DIAGNOSIS — M810 Age-related osteoporosis without current pathological fracture: Secondary | ICD-10-CM | POA: Diagnosis not present

## 2024-09-09 DIAGNOSIS — I890 Lymphedema, not elsewhere classified: Secondary | ICD-10-CM | POA: Diagnosis not present

## 2024-09-09 DIAGNOSIS — K219 Gastro-esophageal reflux disease without esophagitis: Secondary | ICD-10-CM | POA: Diagnosis not present

## 2024-09-09 DIAGNOSIS — Z96641 Presence of right artificial hip joint: Secondary | ICD-10-CM | POA: Diagnosis not present

## 2024-09-09 DIAGNOSIS — M21371 Foot drop, right foot: Secondary | ICD-10-CM | POA: Diagnosis not present

## 2024-09-09 DIAGNOSIS — E785 Hyperlipidemia, unspecified: Secondary | ICD-10-CM | POA: Diagnosis not present

## 2024-09-09 DIAGNOSIS — I5023 Acute on chronic systolic (congestive) heart failure: Secondary | ICD-10-CM | POA: Diagnosis not present

## 2024-09-09 DIAGNOSIS — Z471 Aftercare following joint replacement surgery: Secondary | ICD-10-CM | POA: Diagnosis not present

## 2024-09-09 DIAGNOSIS — I428 Other cardiomyopathies: Secondary | ICD-10-CM | POA: Diagnosis not present

## 2024-09-09 DIAGNOSIS — I110 Hypertensive heart disease with heart failure: Secondary | ICD-10-CM | POA: Diagnosis not present

## 2024-09-09 DIAGNOSIS — Z7951 Long term (current) use of inhaled steroids: Secondary | ICD-10-CM | POA: Diagnosis not present

## 2024-09-09 DIAGNOSIS — E538 Deficiency of other specified B group vitamins: Secondary | ICD-10-CM | POA: Diagnosis not present

## 2024-09-09 DIAGNOSIS — E559 Vitamin D deficiency, unspecified: Secondary | ICD-10-CM | POA: Diagnosis not present

## 2024-09-09 DIAGNOSIS — I4891 Unspecified atrial fibrillation: Secondary | ICD-10-CM | POA: Diagnosis not present

## 2024-09-09 DIAGNOSIS — Z79899 Other long term (current) drug therapy: Secondary | ICD-10-CM | POA: Diagnosis not present

## 2024-09-09 DIAGNOSIS — I872 Venous insufficiency (chronic) (peripheral): Secondary | ICD-10-CM | POA: Diagnosis not present

## 2024-09-09 DIAGNOSIS — J9611 Chronic respiratory failure with hypoxia: Secondary | ICD-10-CM | POA: Diagnosis not present

## 2024-09-09 DIAGNOSIS — I272 Pulmonary hypertension, unspecified: Secondary | ICD-10-CM | POA: Diagnosis not present

## 2024-09-09 DIAGNOSIS — F411 Generalized anxiety disorder: Secondary | ICD-10-CM | POA: Diagnosis not present

## 2024-09-09 DIAGNOSIS — M47819 Spondylosis without myelopathy or radiculopathy, site unspecified: Secondary | ICD-10-CM | POA: Diagnosis not present

## 2024-09-09 DIAGNOSIS — J4489 Other specified chronic obstructive pulmonary disease: Secondary | ICD-10-CM | POA: Diagnosis not present

## 2024-09-15 DIAGNOSIS — I4891 Unspecified atrial fibrillation: Secondary | ICD-10-CM | POA: Diagnosis not present

## 2024-09-15 DIAGNOSIS — I110 Hypertensive heart disease with heart failure: Secondary | ICD-10-CM | POA: Diagnosis not present

## 2024-09-15 DIAGNOSIS — Z471 Aftercare following joint replacement surgery: Secondary | ICD-10-CM | POA: Diagnosis not present

## 2024-09-15 DIAGNOSIS — E1151 Type 2 diabetes mellitus with diabetic peripheral angiopathy without gangrene: Secondary | ICD-10-CM | POA: Diagnosis not present

## 2024-09-15 DIAGNOSIS — J9611 Chronic respiratory failure with hypoxia: Secondary | ICD-10-CM | POA: Diagnosis not present

## 2024-09-15 DIAGNOSIS — I5023 Acute on chronic systolic (congestive) heart failure: Secondary | ICD-10-CM | POA: Diagnosis not present

## 2024-09-21 ENCOUNTER — Telehealth: Payer: Self-pay | Admitting: Internal Medicine

## 2024-09-21 NOTE — Telephone Encounter (Signed)
 Received office note request from [employer] pertaining to cpap use. I faxed back; [phone], that we no longer see patient. She now goes to [facility]

## 2024-09-22 ENCOUNTER — Other Ambulatory Visit: Payer: Self-pay | Admitting: Cardiovascular Disease

## 2024-09-23 ENCOUNTER — Ambulatory Visit: Admitting: Podiatry

## 2024-09-26 MED ORDER — OZEMPIC (1 MG/DOSE) 4 MG/3ML ~~LOC~~ SOPN: 1.0000 mg | PEN_INJECTOR | SUBCUTANEOUS | 3 refills | Status: DC

## 2024-09-26 MED ORDER — OZEMPIC (1 MG/DOSE) 4 MG/3ML ~~LOC~~ SOPN: 1.0000 mg | PEN_INJECTOR | SUBCUTANEOUS | 5 refills | Status: DC

## 2024-09-26 NOTE — Addendum Note (Signed)
 Addended by: Keona Bilyeu D on: 09/26/2024 08:00 PM   Modules accepted: Orders

## 2024-09-26 NOTE — Addendum Note (Signed)
 Addended by: Estephan Gallardo D on: 09/26/2024 04:36 PM   Modules accepted: Orders

## 2024-09-27 ENCOUNTER — Other Ambulatory Visit: Payer: Self-pay | Admitting: Cardiovascular Disease

## 2024-09-27 DIAGNOSIS — E119 Type 2 diabetes mellitus without complications: Secondary | ICD-10-CM

## 2024-09-27 DIAGNOSIS — M1611 Unilateral primary osteoarthritis, right hip: Secondary | ICD-10-CM | POA: Diagnosis not present

## 2024-10-04 DIAGNOSIS — Z471 Aftercare following joint replacement surgery: Secondary | ICD-10-CM | POA: Diagnosis not present

## 2024-10-04 DIAGNOSIS — J9611 Chronic respiratory failure with hypoxia: Secondary | ICD-10-CM | POA: Diagnosis not present

## 2024-10-04 DIAGNOSIS — I110 Hypertensive heart disease with heart failure: Secondary | ICD-10-CM | POA: Diagnosis not present

## 2024-10-04 DIAGNOSIS — I4891 Unspecified atrial fibrillation: Secondary | ICD-10-CM | POA: Diagnosis not present

## 2024-10-04 DIAGNOSIS — I5023 Acute on chronic systolic (congestive) heart failure: Secondary | ICD-10-CM | POA: Diagnosis not present

## 2024-10-04 DIAGNOSIS — E1151 Type 2 diabetes mellitus with diabetic peripheral angiopathy without gangrene: Secondary | ICD-10-CM | POA: Diagnosis not present

## 2024-10-06 DIAGNOSIS — M5416 Radiculopathy, lumbar region: Secondary | ICD-10-CM | POA: Diagnosis not present

## 2024-10-06 DIAGNOSIS — M48062 Spinal stenosis, lumbar region with neurogenic claudication: Secondary | ICD-10-CM | POA: Diagnosis not present

## 2024-10-09 DIAGNOSIS — Z7985 Long-term (current) use of injectable non-insulin antidiabetic drugs: Secondary | ICD-10-CM | POA: Diagnosis not present

## 2024-10-09 DIAGNOSIS — M21372 Foot drop, left foot: Secondary | ICD-10-CM | POA: Diagnosis not present

## 2024-10-09 DIAGNOSIS — Z471 Aftercare following joint replacement surgery: Secondary | ICD-10-CM | POA: Diagnosis not present

## 2024-10-09 DIAGNOSIS — J4489 Other specified chronic obstructive pulmonary disease: Secondary | ICD-10-CM | POA: Diagnosis not present

## 2024-10-09 DIAGNOSIS — E785 Hyperlipidemia, unspecified: Secondary | ICD-10-CM | POA: Diagnosis not present

## 2024-10-09 DIAGNOSIS — M47819 Spondylosis without myelopathy or radiculopathy, site unspecified: Secondary | ICD-10-CM | POA: Diagnosis not present

## 2024-10-09 DIAGNOSIS — J9611 Chronic respiratory failure with hypoxia: Secondary | ICD-10-CM | POA: Diagnosis not present

## 2024-10-09 DIAGNOSIS — E1151 Type 2 diabetes mellitus with diabetic peripheral angiopathy without gangrene: Secondary | ICD-10-CM | POA: Diagnosis not present

## 2024-10-09 DIAGNOSIS — Z79899 Other long term (current) drug therapy: Secondary | ICD-10-CM | POA: Diagnosis not present

## 2024-10-09 DIAGNOSIS — F411 Generalized anxiety disorder: Secondary | ICD-10-CM | POA: Diagnosis not present

## 2024-10-09 DIAGNOSIS — I110 Hypertensive heart disease with heart failure: Secondary | ICD-10-CM | POA: Diagnosis not present

## 2024-10-09 DIAGNOSIS — Z7984 Long term (current) use of oral hypoglycemic drugs: Secondary | ICD-10-CM | POA: Diagnosis not present

## 2024-10-09 DIAGNOSIS — Z7952 Long term (current) use of systemic steroids: Secondary | ICD-10-CM | POA: Diagnosis not present

## 2024-10-09 DIAGNOSIS — I5023 Acute on chronic systolic (congestive) heart failure: Secondary | ICD-10-CM | POA: Diagnosis not present

## 2024-10-09 DIAGNOSIS — I428 Other cardiomyopathies: Secondary | ICD-10-CM | POA: Diagnosis not present

## 2024-10-09 DIAGNOSIS — K219 Gastro-esophageal reflux disease without esophagitis: Secondary | ICD-10-CM | POA: Diagnosis not present

## 2024-10-09 DIAGNOSIS — Z7951 Long term (current) use of inhaled steroids: Secondary | ICD-10-CM | POA: Diagnosis not present

## 2024-10-09 DIAGNOSIS — I872 Venous insufficiency (chronic) (peripheral): Secondary | ICD-10-CM | POA: Diagnosis not present

## 2024-10-09 DIAGNOSIS — Z7901 Long term (current) use of anticoagulants: Secondary | ICD-10-CM | POA: Diagnosis not present

## 2024-10-09 DIAGNOSIS — I4891 Unspecified atrial fibrillation: Secondary | ICD-10-CM | POA: Diagnosis not present

## 2024-10-09 DIAGNOSIS — I272 Pulmonary hypertension, unspecified: Secondary | ICD-10-CM | POA: Diagnosis not present

## 2024-10-09 DIAGNOSIS — M21371 Foot drop, right foot: Secondary | ICD-10-CM | POA: Diagnosis not present

## 2024-10-09 DIAGNOSIS — I890 Lymphedema, not elsewhere classified: Secondary | ICD-10-CM | POA: Diagnosis not present

## 2024-10-09 DIAGNOSIS — M810 Age-related osteoporosis without current pathological fracture: Secondary | ICD-10-CM | POA: Diagnosis not present

## 2024-10-11 DIAGNOSIS — J9611 Chronic respiratory failure with hypoxia: Secondary | ICD-10-CM | POA: Diagnosis not present

## 2024-10-11 DIAGNOSIS — I4891 Unspecified atrial fibrillation: Secondary | ICD-10-CM | POA: Diagnosis not present

## 2024-10-11 DIAGNOSIS — I110 Hypertensive heart disease with heart failure: Secondary | ICD-10-CM | POA: Diagnosis not present

## 2024-10-11 DIAGNOSIS — Z471 Aftercare following joint replacement surgery: Secondary | ICD-10-CM | POA: Diagnosis not present

## 2024-10-11 DIAGNOSIS — I5023 Acute on chronic systolic (congestive) heart failure: Secondary | ICD-10-CM | POA: Diagnosis not present

## 2024-10-11 DIAGNOSIS — E1151 Type 2 diabetes mellitus with diabetic peripheral angiopathy without gangrene: Secondary | ICD-10-CM | POA: Diagnosis not present

## 2024-10-12 DIAGNOSIS — R600 Localized edema: Secondary | ICD-10-CM | POA: Diagnosis not present

## 2024-10-12 DIAGNOSIS — Z6832 Body mass index (BMI) 32.0-32.9, adult: Secondary | ICD-10-CM | POA: Diagnosis not present

## 2024-10-12 DIAGNOSIS — E119 Type 2 diabetes mellitus without complications: Secondary | ICD-10-CM | POA: Diagnosis not present

## 2024-10-12 DIAGNOSIS — Z1231 Encounter for screening mammogram for malignant neoplasm of breast: Secondary | ICD-10-CM | POA: Diagnosis not present

## 2024-10-12 DIAGNOSIS — K219 Gastro-esophageal reflux disease without esophagitis: Secondary | ICD-10-CM | POA: Diagnosis not present

## 2024-10-12 DIAGNOSIS — J449 Chronic obstructive pulmonary disease, unspecified: Secondary | ICD-10-CM | POA: Diagnosis not present

## 2024-10-12 DIAGNOSIS — E669 Obesity, unspecified: Secondary | ICD-10-CM | POA: Diagnosis not present

## 2024-10-18 ENCOUNTER — Ambulatory Visit (INDEPENDENT_AMBULATORY_CARE_PROVIDER_SITE_OTHER): Admitting: Podiatry

## 2024-10-18 ENCOUNTER — Encounter: Payer: Self-pay | Admitting: Podiatry

## 2024-10-18 VITALS — Ht 64.0 in | Wt 205.6 lb

## 2024-10-18 DIAGNOSIS — M79675 Pain in left toe(s): Secondary | ICD-10-CM

## 2024-10-18 DIAGNOSIS — B351 Tinea unguium: Secondary | ICD-10-CM

## 2024-10-18 DIAGNOSIS — M79674 Pain in right toe(s): Secondary | ICD-10-CM

## 2024-10-18 NOTE — Progress Notes (Signed)
 Chief Complaint  Patient presents with   Nail Problem    Pt is here for [facility].    SUBJECTIVE Patient presents to office today complaining of elongated, thickened nails that cause pain while ambulating in shoes.  Patient is unable to trim their own nails. Patient is here for further evaluation and treatment.  Past Medical History:  Diagnosis Date   Acute ischemic left MCA stroke (HCC) 11/20/2020   a.) brain MRI 11/20/2020 - acute M1 MCA occlusion --> embolectomy; (+) residual LEFT  sided weakness   Anxiety    a.) on BZO PRN (alprazolam )   Aortic atherosclerosis    Aortic root dilatation    Asthma    Bilateral carotid artery disease    Cardiomegaly    Chronic atrial fibrillation (HCC) 09/2016   a.) Dx'd 09/2016; b.) failed flecainide and propafenone due to LE swelling and SOB, could not afford Multaq; c.) CHA2DS2VASc = 8 (age, sex, HFrEF, HTN, CVA x 2, vascular disease, T2DM) as of 08/04/2024; d.) rate/rhythm maintained on oral metoprolol  succinate; chronically anticoagulated with apixaban    Compression fracture of L1 vertebra (chronic)    COPD (chronic obstructive pulmonary disease) (HCC)    DDD (degenerative disc disease), thoracic    Dependence on supplemental oxygen     a.) 2 L/[place] PRN; 3L bled in with CPAP at bedtime   DM (diabetes mellitus), type 2 (HCC)    Family history of adverse reaction to anesthesia    a.) PONV in 2nd degree female relative (granddaughter)   GERD (gastroesophageal reflux disease)    HFmrEF (heart failure with mid-range ejection fraction) (HCC)    a. 12/2019 Echo: EF 40-45%. b. 01/2022 Echo: EF 40-45%.   History of 2019 novel coronavirus disease (COVID-19) 10/10/2019   History of methicillin resistant staphylococcus aureus (MRSA)    Hyperlipidemia    Hypertension    Long-term corticosteroid use (predinsone x 10+ years)    NICM (nonischemic cardiomyopathy) (HCC)    a. 12/2019 Echo: EF 40-45%, glob HK, mildly reduced RV fxn, sev dil LA. *HR 130 (afib)  during study.   Nonobstructive CAD (coronary artery disease)    a. Lexiscan Myoview 10/2016: no evidence of ischemia, EF 53%; b. 02/2020 Cath: LM nl, LAD 20p, 78m, LCX 20ost, OM1/2/3 nl, LPDA nl, LPL1/2 nl, LPAV nl, RCA small, nl.   Nose colonized with MRSA 11/20/2020   a.) presurgical PCR (+) 11/20/2020 prior to IR CEREBRAL EMBOLECTOMY   Obesity    On apixaban  therapy    OSA on CPAP    a.) requires 3L O2 bled in at bedtime   Osteoarthritis    Osteoporosis    Pulmonary hypertension (HCC)    Rotator cuff arthropathy of both shoulders    Spinal stenosis of lumbar region 02/03/2024   Status post bilateral cataract extraction     Allergies  Allergen Reactions   Flecainide Shortness Of Breath   Propafenone Shortness Of Breath, Swelling and Other (See Comments)    My limbs swell   Rivaroxaban Swelling and Other (See Comments)    My limbs swell, myalgias   Tirzepatide  Diarrhea and Nausea And Vomiting    Severe adverse side effects of mounjaro      OBJECTIVE General Patient is awake, alert, and oriented x 3 and in no acute distress. Derm Skin is dry and supple bilateral. Negative open lesions or macerations. Remaining integument unremarkable. Nails are tender, long, thickened and dystrophic with subungual debris, consistent with onychomycosis, 1-5 bilateral. No signs of infection noted. Vasc  DP  and PT pedal pulses palpable bilaterally. Temperature gradient within normal limits.  Neuro Epicritic and protective threshold sensation grossly intact bilaterally.  Musculoskeletal Exam No symptomatic pedal deformities noted bilateral. Muscular strength within normal limits.  ASSESSMENT 1.  Pain due to onychomycosis of toenails both 2.  Ingrown toenail lateral aspect of the right great toe  PLAN OF CARE 1. Patient evaluated today.  2. Instructed to maintain good pedal hygiene and foot care.  3. Mechanical debridement of nails 1-5 bilaterally performed using a nail nipper. Filed with  dremel without incident.  Patient felt significant relief of the ingrown 4. Return to clinic in 3 mos.    Thresa EMERSON Sar, DPM [facility]  Dr. Thresa EMERSON Sar, DPM    [address] [place], [place] [address]                Office [phone]  Fax [phone]

## 2024-10-19 DIAGNOSIS — G4733 Obstructive sleep apnea (adult) (pediatric): Secondary | ICD-10-CM | POA: Diagnosis not present

## 2024-10-19 DIAGNOSIS — J4540 Moderate persistent asthma, uncomplicated: Secondary | ICD-10-CM | POA: Diagnosis not present

## 2024-10-26 DIAGNOSIS — M48062 Spinal stenosis, lumbar region with neurogenic claudication: Secondary | ICD-10-CM | POA: Diagnosis not present

## 2024-10-26 DIAGNOSIS — M5416 Radiculopathy, lumbar region: Secondary | ICD-10-CM | POA: Diagnosis not present

## 2024-10-27 DIAGNOSIS — I4891 Unspecified atrial fibrillation: Secondary | ICD-10-CM | POA: Diagnosis not present

## 2024-10-27 DIAGNOSIS — J9611 Chronic respiratory failure with hypoxia: Secondary | ICD-10-CM | POA: Diagnosis not present

## 2024-10-27 DIAGNOSIS — E1151 Type 2 diabetes mellitus with diabetic peripheral angiopathy without gangrene: Secondary | ICD-10-CM | POA: Diagnosis not present

## 2024-10-27 DIAGNOSIS — Z471 Aftercare following joint replacement surgery: Secondary | ICD-10-CM | POA: Diagnosis not present

## 2024-10-27 DIAGNOSIS — I110 Hypertensive heart disease with heart failure: Secondary | ICD-10-CM | POA: Diagnosis not present

## 2024-10-27 DIAGNOSIS — I5023 Acute on chronic systolic (congestive) heart failure: Secondary | ICD-10-CM | POA: Diagnosis not present

## 2024-11-07 ENCOUNTER — Other Ambulatory Visit: Payer: Self-pay | Admitting: Family Medicine

## 2024-11-07 DIAGNOSIS — J9611 Chronic respiratory failure with hypoxia: Secondary | ICD-10-CM | POA: Diagnosis not present

## 2024-11-07 DIAGNOSIS — I5023 Acute on chronic systolic (congestive) heart failure: Secondary | ICD-10-CM | POA: Diagnosis not present

## 2024-11-07 DIAGNOSIS — Z471 Aftercare following joint replacement surgery: Secondary | ICD-10-CM | POA: Diagnosis not present

## 2024-11-07 DIAGNOSIS — M5416 Radiculopathy, lumbar region: Secondary | ICD-10-CM

## 2024-11-07 DIAGNOSIS — I110 Hypertensive heart disease with heart failure: Secondary | ICD-10-CM | POA: Diagnosis not present

## 2024-11-07 DIAGNOSIS — I4891 Unspecified atrial fibrillation: Secondary | ICD-10-CM | POA: Diagnosis not present

## 2024-11-07 DIAGNOSIS — E1151 Type 2 diabetes mellitus with diabetic peripheral angiopathy without gangrene: Secondary | ICD-10-CM | POA: Diagnosis not present

## 2024-11-08 ENCOUNTER — Ambulatory Visit
Admission: RE | Admit: 2024-11-08 | Discharge: 2024-11-08 | Disposition: A | Discharge status: Home / Self Care | Source: Ambulatory Visit | Attending: Family Medicine | Admitting: Family Medicine

## 2024-11-08 DIAGNOSIS — M5416 Radiculopathy, lumbar region: Secondary | ICD-10-CM

## 2024-11-08 DIAGNOSIS — M47816 Spondylosis without myelopathy or radiculopathy, lumbar region: Secondary | ICD-10-CM | POA: Diagnosis not present

## 2024-11-08 DIAGNOSIS — M48061 Spinal stenosis, lumbar region without neurogenic claudication: Secondary | ICD-10-CM | POA: Diagnosis not present

## 2024-11-10 ENCOUNTER — Ambulatory Visit (INDEPENDENT_AMBULATORY_CARE_PROVIDER_SITE_OTHER): Admitting: Neurosurgery

## 2024-11-10 ENCOUNTER — Encounter: Payer: Self-pay | Admitting: Neurosurgery

## 2024-11-10 VITALS — BP 124/78 | Wt 187.8 lb

## 2024-11-10 DIAGNOSIS — M4316 Spondylolisthesis, lumbar region: Secondary | ICD-10-CM | POA: Diagnosis not present

## 2024-11-10 DIAGNOSIS — M48062 Spinal stenosis, lumbar region with neurogenic claudication: Secondary | ICD-10-CM | POA: Diagnosis not present

## 2024-11-10 NOTE — Progress Notes (Signed)
 Referring Physician:  Meeler, Benton CROME, FNP [address] [place],  [place] [address]  Primary Physician:  Pcp, No  History of Present Illness: 11/10/2024 Since I last saw her, she has had her right hip replaced and is doing very well from that.  She is now able to walk with a rollator.  She was essentially nonambulatory before that surgery.  She continues to have back pain primarily but also some leg discomfort.   03/03/2024 Laura [name] is here today with a chief complaint of severe pain in her right groin and leg. The pain began after a fall from her recliner in November and has since worsened, leaving her unable to walk. The pain is located in her right groin and extends down the front of her leg to her knee. She describes the pain as so severe that she cannot lift her leg and feels like something is detached in her groin. She has received two shots for the pain, one in her hip and one for sciatica, but neither has provided relief. The hip shot only numbed the pain for two days, and the sciatica shot did not alleviate her symptoms. She has been receiving home health care and uses a chair for mobility.  She has history of a stroke that affected the left side of her body, but has regained substantial mobility until her recent right groin pain.  Bowel/Bladder Dysfunction: none  Conservative measures:  Physical therapy:  has participated in PT at [facility] including regular antiinflammatories: Robaxin, Tramadol   Injections: no epidural steroid injections  Past Surgery: none  Laura [name] has no symptoms of cervical myelopathy.  The symptoms are causing a significant impact on the patient's life.   I have utilized the care everywhere function in epic to review the outside records available from external health systems.  Review of Systems:  A 10 point review of systems is negative, except for the pertinent positives and negatives detailed in  the HPI.  Past Medical History: Past Medical History:  Diagnosis Date   Acute ischemic left MCA stroke (HCC) 11/20/2020   a.) brain MRI 11/20/2020 - acute M1 MCA occlusion --> embolectomy; (+) residual LEFT  sided weakness   Anxiety    a.) on BZO PRN (alprazolam )   Aortic atherosclerosis    Aortic root dilatation    Asthma    Bilateral carotid artery disease    Cardiomegaly    Chronic atrial fibrillation (HCC) 09/2016   a.) Dx'd 09/2016; b.) failed flecainide and propafenone due to LE swelling and SOB, could not afford Multaq; c.) CHA2DS2VASc = 8 (age, sex, HFrEF, HTN, CVA x 2, vascular disease, T2DM) as of 08/04/2024; d.) rate/rhythm maintained on oral metoprolol  succinate; chronically anticoagulated with apixaban    Compression fracture of L1 vertebra (chronic)    COPD (chronic obstructive pulmonary disease) (HCC)    DDD (degenerative disc disease), thoracic    Dependence on supplemental oxygen     a.) 2 L/[place] PRN; 3L bled in with CPAP at bedtime   DM (diabetes mellitus), type 2 (HCC)    Family history of adverse reaction to anesthesia    a.) PONV in 2nd degree female relative (granddaughter)   GERD (gastroesophageal reflux disease)    HFmrEF (heart failure with mid-range ejection fraction) (HCC)    a. 12/2019 Echo: EF 40-45%. b. 01/2022 Echo: EF 40-45%.   History of 2019 novel coronavirus disease (COVID-19) 10/10/2019   History of methicillin resistant staphylococcus aureus (MRSA)  Hyperlipidemia    Hypertension    Long-term corticosteroid use (predinsone x 10+ years)    NICM (nonischemic cardiomyopathy) (HCC)    a. 12/2019 Echo: EF 40-45%, glob HK, mildly reduced RV fxn, sev dil LA. *HR 130 (afib) during study.   Nonobstructive CAD (coronary artery disease)    a. Lexiscan Myoview 10/2016: no evidence of ischemia, EF 53%; b. 02/2020 Cath: LM nl, LAD 20p, 79m, LCX 20ost, OM1/2/3 nl, LPDA nl, LPL1/2 nl, LPAV nl, RCA small, nl.   Nose colonized with MRSA 11/20/2020   a.) presurgical  PCR (+) 11/20/2020 prior to IR CEREBRAL EMBOLECTOMY   Obesity    On apixaban  therapy    OSA on CPAP    a.) requires 3L O2 bled in at bedtime   Osteoarthritis    Osteoporosis    Pulmonary hypertension (HCC)    Rotator cuff arthropathy of both shoulders    Spinal stenosis of lumbar region 02/03/2024   Status post bilateral cataract extraction     Past Surgical History: Past Surgical History:  Procedure Laterality Date   BOWEL RESECTION  09/11/2019   Procedure: SMALL BOWEL RESECTION;  Surgeon: Rodolph Romano, MD;  Location: [facility] ORS;  Service: General;;   CATARACT EXTRACTION Bilateral    COLONOSCOPY WITH PROPOFOL  N/A 03/19/2020   Procedure: COLONOSCOPY WITH PROPOFOL ;  Surgeon: Therisa Bi, MD;  Location: [facility] ENDOSCOPY;  Service: Gastroenterology;  Laterality: N/A;   INCISION AND DRAINAGE ABSCESS Right 06/29/2016   Procedure: INCISION AND DRAINAGE ABSCESS;  Surgeon: Charlie FORBES Fell, MD;  Location: [facility] ORS;  Service: General;  Laterality: Right;   INCISION AND DRAINAGE OF WOUND Left 06/29/2016   Procedure: IRRIGATION AND DEBRIDEMENT WOUND;  Surgeon: Charlie FORBES Fell, MD;  Location: [facility] ORS;  Service: General;  Laterality: Left;   IR ANGIO VERTEBRAL SEL SUBCLAVIAN INNOMINATE UNI R MOD SED  11/20/2020   IR CT HEAD LTD  11/20/2020   IR PERCUTANEOUS ART THROMBECTOMY/INFUSION INTRACRANIAL INC DIAG ANGIO  11/20/2020   LAPAROSCOPIC RIGHT COLECTOMY  09/11/2019   Procedure: RIGHT COLECTOMY;  Surgeon: Rodolph Romano, MD;  Location: [facility] ORS;  Service: General;;   LAPAROSCOPY N/A 09/11/2019   Procedure: LAPAROSCOPY DIAGNOSTIC;  Surgeon: Rodolph Romano, MD;  Location: [facility] ORS;  Service: General;  Laterality: N/A;   LAPAROTOMY N/A 09/13/2019   Procedure: REOPENING OF RECENT LAPAROTOMYANASTOMOSIS OF BOWEL;  Surgeon: Rodolph Romano, MD;  Location: [facility] ORS;  Service: General;  Laterality: N/A;   RADIOLOGY WITH ANESTHESIA N/A 11/20/2020   Procedure: IR WITH ANESTHESIA -  CODE STROKE;  Surgeon: Radiologist, Medication, MD;  Location: [facility] OR;  Service: Radiology;  Laterality: N/A;   RIGHT/LEFT HEART CATH AND CORONARY ANGIOGRAPHY Bilateral 02/27/2020   Procedure: RIGHT/LEFT HEART CATH AND CORONARY ANGIOGRAPHY;  Surgeon: Darron Deatrice LABOR, MD;  Location: [facility] INVASIVE CV LAB;  Service: Cardiovascular;  Laterality: Bilateral;   TOTAL HIP ARTHROPLASTY Right 08/08/2024   Procedure: ARTHROPLASTY, HIP, TOTAL,POSTERIOR APPROACH;  Surgeon: Lorelle Hussar, MD;  Location: [facility] ORS;  Service: Orthopedics;  Laterality: Right;   VISCERAL ANGIOGRAPHY N/A 09/12/2019   Procedure: VISCERAL ANGIOGRAPHY;  Surgeon: Marea Selinda RAMAN, MD;  Location: [facility] INVASIVE CV LAB;  Service: Cardiovascular;  Laterality: N/A;    Allergies: Allergies as of 11/10/2024 - Review Complete 10/18/2024  Allergen Reaction Noted   Flecainide Shortness Of Breath 11/27/2016   Propafenone Shortness Of Breath, Swelling, and Other (See Comments) 12/01/2016   Rivaroxaban Swelling and Other (See Comments) 10/23/2016   Tirzepatide  Diarrhea and Nausea And Vomiting 06/18/2023    Medications:  Current Outpatient Medications:    Accu-Chek Softclix Lancets lancets, Accu-Chek Softclix Lancets  USE AS INSTRUCTED TO CHECK BLOOD SUGARS DAILY 2 HOURS AFTER MEAL, Disp: , Rfl:    acetaminophen  (TYLENOL ) 500 MG tablet, Take 2 tablets (1,000 mg total) by mouth every 8 (eight) hours., Disp: 30 tablet, Rfl: 0   alendronate (FOSAMAX) 70 MG tablet, Take 70 mg by mouth once a week. Take with a full glass of water on an empty stomach.  Wednesdays, Disp: , Rfl:    ALPRAZolam  (XANAX ) 0.25 MG tablet, TAKE 1 TABLET BY MOUTH TWICE A DAY AS NEEDED FOR ANXIETY (Patient taking differently: Take 0.25 mg by mouth 2 (two) times daily as needed. TAKE 1 TABLET BY MOUTH TWICE A DAY AS NEEDED FOR ANXIETY), Disp: 60 tablet, Rfl: 0   COMBIVENT RESPIMAT 20-100 MCG/ACT AERS respimat, SMARTSIG:1 Puff(s) Via Inhaler 4 Times Daily PRN, Disp: , Rfl:     docusate sodium  (COLACE) 100 MG capsule, Take 1 capsule (100 mg total) by mouth 2 (two) times daily., Disp: 10 capsule, Rfl: 0   ELIQUIS  5 MG TABS tablet, TAKE 1 TABLET BY MOUTH TWICE A DAY, Disp: 60 tablet, Rfl: 5   EPINEPHrine  0.3 mg/0.3 mL IJ SOAJ injection, Inject 0.3 mg into the muscle as needed for anaphylaxis., Disp: , Rfl:    famotidine  (PEPCID ) 40 MG tablet, Take 1 tablet (40 mg total) by mouth at bedtime., Disp: , Rfl:    fluticasone  (FLONASE) 50 MCG/ACT nasal spray, Place 2 sprays into the nose every morning., Disp: , Rfl:    Fluticasone -Umeclidin-Vilant (TRELEGY ELLIPTA) 200-62.5-25 MCG/ACT AEPB, Inhale 1 puff into the lungs every morning., Disp: , Rfl:    furosemide  (LASIX ) 20 MG tablet, Take 1 tablet (20 mg total) by mouth daily. (Patient taking differently: Take 20 mg by mouth every morning.), Disp: 30 tablet, Rfl: 11   glucose blood (ACCU-CHEK GUIDE) test strip, Use as instructed to check blood sugars daily 2 hours after meal DX E11.65, Disp: 100 each, Rfl: 12   ipratropium-albuterol  (DUONEB) 0.5-2.5 (3) MG/3ML SOLN, Take 3 mLs by nebulization every 6 (six) hours as needed., Disp: , Rfl:    JARDIANCE  10 MG TABS tablet, TAKE 1 TABLET BY MOUTH DAILY BEFORE BREAKFAST., Disp: 30 tablet, Rfl: 5   loperamide (IMODIUM) 2 MG capsule, Take 4 mg by mouth as needed for diarrhea or loose stools., Disp: , Rfl:    metoprolol  succinate (TOPROL -XL) 25 MG 24 hr tablet, Take 1 tablet (25 mg total) by mouth in the morning and at bedtime. (Patient taking differently: Take 75 mg by mouth in the morning and at bedtime.), Disp: 180 tablet, Rfl: 3   montelukast  (SINGULAIR ) 10 MG tablet, TAKE 1 TABLET BY MOUTH EVERY DAY (Patient taking differently: Take 10 mg by mouth every morning.), Disp: 90 tablet, Rfl: 1   ondansetron  (ZOFRAN ) 4 MG tablet, Take 1 tablet (4 mg total) by mouth every 6 (six) hours as needed for nausea., Disp: 20 tablet, Rfl: 0   oxyCODONE  (ROXICODONE ) 5 MG immediate release tablet, Take 0.5  tablets (2.5 mg total) by mouth every 8 (eight) hours as needed for breakthrough pain., Disp: 20 tablet, Rfl: 0   OXYGEN , Inhale 2.5-3 L into the lungs See admin instructions. 2.5 L [place] prn throughout the day and 3 L with CPAP, Disp: , Rfl:    potassium chloride  SA (KLOR-CON  M) 10 MEQ tablet, Take 2 tablets (20 mEq) today, then 1 tablet (10 mEq) daily until surgery. Be sure to take dose on  day of surgery. Follow up with PCP for repeat labs., Disp: 8 tablet, Rfl: 0   predniSONE  (DELTASONE ) 10 MG tablet, Take 10 mg by mouth daily with breakfast., Disp: , Rfl:    rosuvastatin  (CRESTOR ) 20 MG tablet, TAKE 1 TABLET BY MOUTH EVERY DAY (Patient taking differently: Take 20 mg by mouth every morning.), Disp: 90 tablet, Rfl: 2   Semaglutide , 1 MG/DOSE, (OZEMPIC , 1 MG/DOSE,) 4 MG/3ML SOPN, Inject 1 mg into the skin once a week., Disp: 3 mL, Rfl: 1   spironolactone  (ALDACTONE ) 25 MG tablet, TAKE 1/2 TABLET BY MOUTH EVERY DAY (Patient taking differently: Take 25 mg by mouth every morning.), Disp: 45 tablet, Rfl: 1   tizanidine (ZANAFLEX) 2 MG capsule, Take 2 mg by mouth every 8 (eight) hours as needed for muscle spasms., Disp: , Rfl:    traMADol  (ULTRAM ) 50 MG tablet, Take 1 tablet (50 mg total) by mouth every 6 (six) hours as needed for moderate pain (pain score 4-6)., Disp: 30 tablet, Rfl: 0  Social History: Social History   Tobacco Use   Smoking status: Never   Smokeless tobacco: Never  Vaping Use   Vaping status: Never Used  Substance Use Topics   Alcohol use: No   Drug use: No    Family Medical History: Family History  Problem Relation Age of Onset   Dementia Mother    Osteoporosis Mother    Vascular Disease Mother    COPD Father    Heart disease Brother    Cancer Daughter     Physical Examination: There were no vitals filed for this visit.   General: Patient is in no apparent distress. Attention to examination is appropriate.  Neck:   Supple.  Full range of  motion.  Respiratory: Patient is breathing without any difficulty.   NEUROLOGICAL:     Awake, alert, oriented to person, place, and time.  Speech is clear and fluent.   Cranial Nerves: Pupils equal round and reactive to light.  Facial tone is symmetric.  Facial sensation is symmetric. Shoulder shrug is symmetric. Tongue protrusion is midline.  There is no pronator drift.  Strength: Side Biceps Triceps Deltoid Interossei Grip Wrist Ext. Wrist Flex.  R 5 5 5 5 5 5 5   L 5 5 2 5 5 5 5    Side Iliopsoas Quads Hamstring PF DF EHL  R 5 5 5 5 5 5   L 5 5 5 5 5 5    Reflexes are 1+ and symmetric at the biceps, triceps, brachioradialis, patella and achilles.   Hoffman's is absent.   Bilateral upper and lower extremity sensation is intact to light touch.    No evidence of dysmetria noted.     Medical Decision Making  Imaging: MRI lumbar spine on December 09, 2023 shows moderate and severe spondylosis causing stenosis at L3-4, L2-3, and L4-5.  There is moderate stenosis at L2-3, severe stenosis at L3-4, and severe stenosis at L4-5 with a grade 1 spondylolisthesis.  I have personally reviewed the images and agree with the above interpretation.  Assessment and Plan: Laura [name] is a pleasant 73 y.o. female with anterolisthesis of L4 and L5 as well as severe stenosis at L2-3, L3-4, and L4-5.  She has some leg symptoms but they are not currently life-limiting.  She has made a lot of improvements with physical therapy after her hip replacement.  She is interested in trying another injection and continuing some physical therapy.  I will see her back in approximately 6 to  8 weeks after her next injection.  I spent a total of 30 minutes in this patient's care today. This time was spent reviewing pertinent records including imaging studies, obtaining and confirming history, performing a directed evaluation, formulating and discussing my recommendations, and documenting the visit within the medical  record.   Thank you for involving me in the care of this patient.      Kristy Catoe K. Clois MD, [facility]

## 2024-11-16 DIAGNOSIS — M1612 Unilateral primary osteoarthritis, left hip: Secondary | ICD-10-CM | POA: Diagnosis not present

## 2024-11-21 DIAGNOSIS — M5416 Radiculopathy, lumbar region: Secondary | ICD-10-CM | POA: Diagnosis not present

## 2024-11-21 DIAGNOSIS — M48062 Spinal stenosis, lumbar region with neurogenic claudication: Secondary | ICD-10-CM | POA: Diagnosis not present

## 2024-11-21 DIAGNOSIS — M1612 Unilateral primary osteoarthritis, left hip: Secondary | ICD-10-CM | POA: Diagnosis not present

## 2024-11-23 ENCOUNTER — Other Ambulatory Visit: Payer: Self-pay | Admitting: Cardiovascular Disease

## 2024-11-23 DIAGNOSIS — Z8673 Personal history of transient ischemic attack (TIA), and cerebral infarction without residual deficits: Secondary | ICD-10-CM

## 2024-11-23 DIAGNOSIS — E785 Hyperlipidemia, unspecified: Secondary | ICD-10-CM

## 2024-11-23 DIAGNOSIS — I25118 Atherosclerotic heart disease of native coronary artery with other forms of angina pectoris: Secondary | ICD-10-CM

## 2024-11-29 DIAGNOSIS — Z96641 Presence of right artificial hip joint: Secondary | ICD-10-CM | POA: Diagnosis not present

## 2024-11-29 DIAGNOSIS — M7062 Trochanteric bursitis, left hip: Secondary | ICD-10-CM | POA: Diagnosis not present

## 2025-01-20 ENCOUNTER — Ambulatory Visit (INDEPENDENT_AMBULATORY_CARE_PROVIDER_SITE_OTHER): Admitting: Podiatry

## 2025-01-20 ENCOUNTER — Encounter: Payer: Self-pay | Admitting: Podiatry

## 2025-01-20 DIAGNOSIS — M79675 Pain in left toe(s): Secondary | ICD-10-CM | POA: Diagnosis not present

## 2025-01-20 DIAGNOSIS — B351 Tinea unguium: Secondary | ICD-10-CM

## 2025-01-20 DIAGNOSIS — M79674 Pain in right toe(s): Secondary | ICD-10-CM | POA: Diagnosis not present

## 2025-01-20 NOTE — Progress Notes (Signed)
 "  Chief Complaint  Patient presents with   Nail Problem    [facility]    SUBJECTIVE Patient presents to office today complaining of elongated, thickened nails that cause pain while ambulating in shoes.  Patient is unable to trim their own nails. Patient is here for further evaluation and treatment.  Past Medical History:  Diagnosis Date   Acute ischemic left MCA stroke (HCC) 11/20/2020   a.) brain MRI 11/20/2020 - acute M1 MCA occlusion --> embolectomy; (+) residual LEFT  sided weakness   Anxiety    a.) on BZO PRN (alprazolam )   Aortic atherosclerosis    Aortic root dilatation    Asthma    Bilateral carotid artery disease    Cardiomegaly    Chronic atrial fibrillation (HCC) 09/2016   a.) Dx'd 09/2016; b.) failed flecainide and propafenone due to LE swelling and SOB, could not afford Multaq; c.) CHA2DS2VASc = 8 (age, sex, HFrEF, HTN, CVA x 2, vascular disease, T2DM) as of 08/04/2024; d.) rate/rhythm maintained on oral metoprolol  succinate; chronically anticoagulated with apixaban    Compression fracture of L1 vertebra (chronic)    COPD (chronic obstructive pulmonary disease) (HCC)    DDD (degenerative disc disease), thoracic    Dependence on supplemental oxygen     a.) 2 L/[place] PRN; 3L bled in with CPAP at bedtime   DM (diabetes mellitus), type 2 (HCC)    Family history of adverse reaction to anesthesia    a.) PONV in 2nd degree female relative (granddaughter)   GERD (gastroesophageal reflux disease)    HFmrEF (heart failure with mid-range ejection fraction) (HCC)    a. 12/2019 Echo: EF 40-45%. b. 01/2022 Echo: EF 40-45%.   History of 2019 novel coronavirus disease (COVID-19) 10/10/2019   History of methicillin resistant staphylococcus aureus (MRSA)    Hyperlipidemia    Hypertension    Long-term corticosteroid use (predinsone x 10+ years)    NICM (nonischemic cardiomyopathy) (HCC)    a. 12/2019 Echo: EF 40-45%, glob HK, mildly reduced RV fxn, sev dil LA. *HR 130 (afib) during study.    Nonobstructive CAD (coronary artery disease)    a. Lexiscan Myoview 10/2016: no evidence of ischemia, EF 53%; b. 02/2020 Cath: LM nl, LAD 20p, 89m, LCX 20ost, OM1/2/3 nl, LPDA nl, LPL1/2 nl, LPAV nl, RCA small, nl.   Nose colonized with MRSA 11/20/2020   a.) presurgical PCR (+) 11/20/2020 prior to IR CEREBRAL EMBOLECTOMY   Obesity    On apixaban  therapy    OSA on CPAP    a.) requires 3L O2 bled in at bedtime   Osteoarthritis    Osteoporosis    Pulmonary hypertension (HCC)    Rotator cuff arthropathy of both shoulders    Spinal stenosis of lumbar region 02/03/2024   Status post bilateral cataract extraction     Allergies  Allergen Reactions   Flecainide Shortness Of Breath   Propafenone Shortness Of Breath, Swelling and Other (See Comments)    My limbs swell   Rivaroxaban Swelling and Other (See Comments)    My limbs swell, myalgias   Tirzepatide  Diarrhea and Nausea And Vomiting    Severe adverse side effects of mounjaro      OBJECTIVE General Patient is awake, alert, and oriented x 3 and in no acute distress. Derm Skin is dry and supple bilateral. Negative open lesions or macerations. Remaining integument unremarkable. Nails are tender, long, thickened and dystrophic with subungual debris, consistent with onychomycosis, 1-5 bilateral. No signs of infection noted. Vasc  DP and PT pedal  pulses palpable bilaterally. Temperature gradient within normal limits.  Neuro grossly intact via light touch Musculoskeletal Exam No symptomatic pedal deformities noted bilateral. Muscular strength within normal limits.  ASSESSMENT 1.  Pain due to onychomycosis of toenails both  PLAN OF CARE 1. Patient evaluated today.  2. Instructed to maintain good pedal hygiene and foot care.  3. Mechanical debridement of nails 1-5 bilaterally performed using a nail nipper. Filed with dremel without incident.  Patient felt significant relief of the ingrown 4. Return to clinic in 3 mos.    Thresa EMERSON Sar, DPM [facility]  Dr. Thresa EMERSON Sar, DPM    [address] [place], [place] [address]                Office [phone]  Fax [phone]     "

## 2025-02-15 ENCOUNTER — Ambulatory Visit: Admitting: Cardiovascular Disease
